# Patient Record
Sex: Male | Born: 1960
Health system: Southern US, Community
[De-identification: ages and names within clinical notes are randomized; demographics above are authoritative.]

## PROBLEM LIST (undated history)

## (undated) DIAGNOSIS — E119 Type 2 diabetes mellitus without complications: Secondary | ICD-10-CM

## (undated) DIAGNOSIS — Z7901 Long term (current) use of anticoagulants: Secondary | ICD-10-CM

## (undated) DIAGNOSIS — A419 Sepsis, unspecified organism: Secondary | ICD-10-CM

## (undated) DIAGNOSIS — I214 Non-ST elevation (NSTEMI) myocardial infarction: Secondary | ICD-10-CM

## (undated) DIAGNOSIS — Z992 Dependence on renal dialysis: Secondary | ICD-10-CM

## (undated) DIAGNOSIS — I428 Other cardiomyopathies: Secondary | ICD-10-CM

## (undated) DIAGNOSIS — N186 End stage renal disease: Secondary | ICD-10-CM

## (undated) DIAGNOSIS — I248 Other forms of acute ischemic heart disease: Secondary | ICD-10-CM

## (undated) DIAGNOSIS — I5020 Unspecified systolic (congestive) heart failure: Secondary | ICD-10-CM

## (undated) DIAGNOSIS — I2510 Atherosclerotic heart disease of native coronary artery without angina pectoris: Secondary | ICD-10-CM

## (undated) DIAGNOSIS — I509 Heart failure, unspecified: Secondary | ICD-10-CM

## (undated) DIAGNOSIS — I5022 Chronic systolic (congestive) heart failure: Secondary | ICD-10-CM

## (undated) DIAGNOSIS — Z794 Long term (current) use of insulin: Secondary | ICD-10-CM

## (undated) DIAGNOSIS — I429 Cardiomyopathy, unspecified: Secondary | ICD-10-CM

## (undated) DIAGNOSIS — I10 Essential (primary) hypertension: Secondary | ICD-10-CM

## (undated) DIAGNOSIS — N184 Chronic kidney disease, stage 4 (severe): Secondary | ICD-10-CM

## (undated) DIAGNOSIS — N170 Acute kidney failure with tubular necrosis: Secondary | ICD-10-CM

## (undated) DIAGNOSIS — D649 Anemia, unspecified: Secondary | ICD-10-CM

## (undated) DIAGNOSIS — I82A12 Acute embolism and thrombosis of left axillary vein: Secondary | ICD-10-CM

## (undated) DIAGNOSIS — N189 Chronic kidney disease, unspecified: Secondary | ICD-10-CM

## (undated) DIAGNOSIS — F32A Anxiety disorder, unspecified: Secondary | ICD-10-CM

## (undated) DIAGNOSIS — F419 Anxiety disorder, unspecified: Secondary | ICD-10-CM

## (undated) DIAGNOSIS — G459 Transient cerebral ischemic attack, unspecified: Secondary | ICD-10-CM

## (undated) DIAGNOSIS — R079 Chest pain, unspecified: Secondary | ICD-10-CM

## (undated) DIAGNOSIS — E109 Type 1 diabetes mellitus without complications: Secondary | ICD-10-CM

## (undated) DIAGNOSIS — R45851 Suicidal ideations: Secondary | ICD-10-CM

## (undated) DIAGNOSIS — E785 Hyperlipidemia, unspecified: Secondary | ICD-10-CM

## (undated) HISTORY — PX: APPENDECTOMY: SHX54

## (undated) HISTORY — PX: CARDIAC CATHETERIZATION: SHX172

## (undated) HISTORY — PX: NASAL SINUS SURGERY: SHX719

## (undated) HISTORY — DX: Atherosclerotic heart disease of native coronary artery without angina pectoris: I25.10

## (undated) HISTORY — DX: Chronic kidney disease, stage 4 (severe): N18.4

## (undated) HISTORY — DX: Heart failure, unspecified: I50.9

## (undated) HISTORY — DX: Chronic kidney disease, unspecified: N18.9

---

## 2007-01-31 ENCOUNTER — Inpatient Hospital Stay: Payer: Self-pay | Admitting: Vascular Surgery

## 2009-07-28 ENCOUNTER — Observation Stay: Payer: Self-pay | Admitting: Internal Medicine

## 2019-02-19 DIAGNOSIS — T304 Corrosion of unspecified body region, unspecified degree: Secondary | ICD-10-CM | POA: Diagnosis not present

## 2019-09-11 ENCOUNTER — Emergency Department: Payer: Self-pay

## 2019-09-11 ENCOUNTER — Inpatient Hospital Stay
Admission: EM | Admit: 2019-09-11 | Discharge: 2019-09-15 | DRG: 193 | Disposition: A | Discharge status: Home / Self Care | Payer: Self-pay | Attending: Internal Medicine | Admitting: Internal Medicine

## 2019-09-11 ENCOUNTER — Inpatient Hospital Stay (HOSPITAL_COMMUNITY)
Admit: 2019-09-11 | Discharge: 2019-09-11 | Disposition: A | Discharge status: Home / Self Care | Payer: Self-pay | Attending: Family Medicine | Admitting: Family Medicine

## 2019-09-11 ENCOUNTER — Other Ambulatory Visit: Payer: Self-pay

## 2019-09-11 DIAGNOSIS — E1169 Type 2 diabetes mellitus with other specified complication: Secondary | ICD-10-CM

## 2019-09-11 DIAGNOSIS — J9601 Acute respiratory failure with hypoxia: Secondary | ICD-10-CM

## 2019-09-11 DIAGNOSIS — J9602 Acute respiratory failure with hypercapnia: Secondary | ICD-10-CM | POA: Diagnosis present

## 2019-09-11 DIAGNOSIS — J9621 Acute and chronic respiratory failure with hypoxia: Secondary | ICD-10-CM | POA: Diagnosis present

## 2019-09-11 DIAGNOSIS — D631 Anemia in chronic kidney disease: Secondary | ICD-10-CM | POA: Diagnosis present

## 2019-09-11 DIAGNOSIS — I161 Hypertensive emergency: Secondary | ICD-10-CM | POA: Diagnosis present

## 2019-09-11 DIAGNOSIS — J9622 Acute and chronic respiratory failure with hypercapnia: Secondary | ICD-10-CM | POA: Diagnosis present

## 2019-09-11 DIAGNOSIS — I130 Hypertensive heart and chronic kidney disease with heart failure and stage 1 through stage 4 chronic kidney disease, or unspecified chronic kidney disease: Secondary | ICD-10-CM | POA: Diagnosis present

## 2019-09-11 DIAGNOSIS — Z803 Family history of malignant neoplasm of breast: Secondary | ICD-10-CM

## 2019-09-11 DIAGNOSIS — E1165 Type 2 diabetes mellitus with hyperglycemia: Secondary | ICD-10-CM

## 2019-09-11 DIAGNOSIS — Z833 Family history of diabetes mellitus: Secondary | ICD-10-CM

## 2019-09-11 DIAGNOSIS — I5042 Chronic combined systolic (congestive) and diastolic (congestive) heart failure: Secondary | ICD-10-CM | POA: Diagnosis present

## 2019-09-11 DIAGNOSIS — E11649 Type 2 diabetes mellitus with hypoglycemia without coma: Secondary | ICD-10-CM | POA: Diagnosis not present

## 2019-09-11 DIAGNOSIS — E785 Hyperlipidemia, unspecified: Secondary | ICD-10-CM | POA: Diagnosis present

## 2019-09-11 DIAGNOSIS — I5020 Unspecified systolic (congestive) heart failure: Secondary | ICD-10-CM

## 2019-09-11 DIAGNOSIS — I248 Other forms of acute ischemic heart disease: Secondary | ICD-10-CM | POA: Diagnosis not present

## 2019-09-11 DIAGNOSIS — N1832 Chronic kidney disease, stage 3b: Secondary | ICD-10-CM | POA: Diagnosis present

## 2019-09-11 DIAGNOSIS — E1122 Type 2 diabetes mellitus with diabetic chronic kidney disease: Secondary | ICD-10-CM | POA: Diagnosis present

## 2019-09-11 DIAGNOSIS — I152 Hypertension secondary to endocrine disorders: Secondary | ICD-10-CM

## 2019-09-11 DIAGNOSIS — Z20822 Contact with and (suspected) exposure to covid-19: Secondary | ICD-10-CM | POA: Diagnosis present

## 2019-09-11 DIAGNOSIS — E1159 Type 2 diabetes mellitus with other circulatory complications: Secondary | ICD-10-CM

## 2019-09-11 DIAGNOSIS — I160 Hypertensive urgency: Secondary | ICD-10-CM

## 2019-09-11 DIAGNOSIS — N179 Acute kidney failure, unspecified: Secondary | ICD-10-CM | POA: Diagnosis present

## 2019-09-11 DIAGNOSIS — Z66 Do not resuscitate: Secondary | ICD-10-CM | POA: Diagnosis not present

## 2019-09-11 DIAGNOSIS — J189 Pneumonia, unspecified organism: Principal | ICD-10-CM | POA: Diagnosis present

## 2019-09-11 DIAGNOSIS — R0602 Shortness of breath: Secondary | ICD-10-CM

## 2019-09-11 DIAGNOSIS — I10 Essential (primary) hypertension: Secondary | ICD-10-CM

## 2019-09-11 DIAGNOSIS — I43 Cardiomyopathy in diseases classified elsewhere: Secondary | ICD-10-CM | POA: Diagnosis present

## 2019-09-11 HISTORY — DX: Type 2 diabetes mellitus without complications: E11.9

## 2019-09-11 HISTORY — DX: Essential (primary) hypertension: I10

## 2019-09-11 HISTORY — DX: Cardiomyopathy, unspecified: I42.9

## 2019-09-11 HISTORY — DX: Chest pain, unspecified: R07.9

## 2019-09-11 HISTORY — DX: Unspecified systolic (congestive) heart failure: I50.20

## 2019-09-11 LAB — COMPREHENSIVE METABOLIC PANEL
ALT: 21 U/L (ref 0–44)
AST: 22 U/L (ref 15–41)
Albumin: 2.8 g/dL — ABNORMAL LOW (ref 3.5–5.0)
Alkaline Phosphatase: 93 U/L (ref 38–126)
Anion gap: 7 (ref 5–15)
BUN: 34 mg/dL — ABNORMAL HIGH (ref 6–20)
CO2: 24 mmol/L (ref 22–32)
Calcium: 8.6 mg/dL — ABNORMAL LOW (ref 8.9–10.3)
Chloride: 109 mmol/L (ref 98–111)
Creatinine, Ser: 1.83 mg/dL — ABNORMAL HIGH (ref 0.61–1.24)
GFR calc Af Amer: 47 mL/min — ABNORMAL LOW (ref >60)
GFR calc non Af Amer: 40 mL/min — ABNORMAL LOW (ref >60)
Glucose, Bld: 313 mg/dL — ABNORMAL HIGH (ref 70–99)
Potassium: 4.3 mmol/L (ref 3.5–5.1)
Sodium: 140 mmol/L (ref 135–145)
Total Bilirubin: 0.4 mg/dL (ref 0.3–1.2)
Total Protein: 6.3 g/dL — ABNORMAL LOW (ref 6.5–8.1)

## 2019-09-11 LAB — CBC WITH DIFFERENTIAL/PLATELET
Abs Immature Granulocytes: 0.02 10*3/uL (ref 0.00–0.07)
Basophils Absolute: 0 10*3/uL (ref 0.0–0.1)
Basophils Relative: 0 %
Eosinophils Absolute: 0.3 10*3/uL (ref 0.0–0.5)
Eosinophils Relative: 5 %
HCT: 33.2 % — ABNORMAL LOW (ref 39.0–52.0)
Hemoglobin: 11.3 g/dL — ABNORMAL LOW (ref 13.0–17.0)
Immature Granulocytes: 0 %
Lymphocytes Relative: 28 %
Lymphs Abs: 1.9 10*3/uL (ref 0.7–4.0)
MCH: 29.4 pg (ref 26.0–34.0)
MCHC: 34 g/dL (ref 30.0–36.0)
MCV: 86.2 fL (ref 80.0–100.0)
Monocytes Absolute: 0.4 10*3/uL (ref 0.1–1.0)
Monocytes Relative: 6 %
Neutro Abs: 4.1 10*3/uL (ref 1.7–7.7)
Neutrophils Relative %: 61 %
Platelets: 269 10*3/uL (ref 150–400)
RBC: 3.85 MIL/uL — ABNORMAL LOW (ref 4.22–5.81)
RDW: 11.8 % (ref 11.5–15.5)
WBC: 6.7 10*3/uL (ref 4.0–10.5)
nRBC: 0 % (ref 0.0–0.2)

## 2019-09-11 LAB — GLUCOSE, CAPILLARY
Glucose-Capillary: 195 mg/dL — ABNORMAL HIGH (ref 70–99)
Glucose-Capillary: 294 mg/dL — ABNORMAL HIGH (ref 70–99)
Glucose-Capillary: 411 mg/dL — ABNORMAL HIGH (ref 70–99)
Glucose-Capillary: 452 mg/dL — ABNORMAL HIGH (ref 70–99)
Glucose-Capillary: 458 mg/dL — ABNORMAL HIGH (ref 70–99)
Glucose-Capillary: 465 mg/dL — ABNORMAL HIGH (ref 70–99)

## 2019-09-11 LAB — BASIC METABOLIC PANEL
Anion gap: 5 (ref 5–15)
BUN: 36 mg/dL — ABNORMAL HIGH (ref 6–20)
CO2: 27 mmol/L (ref 22–32)
Calcium: 8.5 mg/dL — ABNORMAL LOW (ref 8.9–10.3)
Chloride: 109 mmol/L (ref 98–111)
Creatinine, Ser: 1.82 mg/dL — ABNORMAL HIGH (ref 0.61–1.24)
GFR calc Af Amer: 47 mL/min — ABNORMAL LOW (ref >60)
GFR calc non Af Amer: 41 mL/min — ABNORMAL LOW (ref >60)
Glucose, Bld: 260 mg/dL — ABNORMAL HIGH (ref 70–99)
Potassium: 4.6 mmol/L (ref 3.5–5.1)
Sodium: 141 mmol/L (ref 135–145)

## 2019-09-11 LAB — CBC
HCT: 31.8 % — ABNORMAL LOW (ref 39.0–52.0)
Hemoglobin: 10.5 g/dL — ABNORMAL LOW (ref 13.0–17.0)
MCH: 29.2 pg (ref 26.0–34.0)
MCHC: 33 g/dL (ref 30.0–36.0)
MCV: 88.6 fL (ref 80.0–100.0)
Platelets: 274 10*3/uL (ref 150–400)
RBC: 3.59 MIL/uL — ABNORMAL LOW (ref 4.22–5.81)
RDW: 11.8 % (ref 11.5–15.5)
WBC: 7.6 10*3/uL (ref 4.0–10.5)
nRBC: 0 % (ref 0.0–0.2)

## 2019-09-11 LAB — TROPONIN I (HIGH SENSITIVITY)
Troponin I (High Sensitivity): 82 ng/L — ABNORMAL HIGH (ref <18)
Troponin I (High Sensitivity): 163 ng/L (ref <18)
Troponin I (High Sensitivity): 199 ng/L (ref <18)
Troponin I (High Sensitivity): 189 ng/L (ref <18)

## 2019-09-11 LAB — HEMOGLOBIN A1C
Hgb A1c MFr Bld: 9.8 % — ABNORMAL HIGH (ref 4.8–5.6)
Mean Plasma Glucose: 234.56 mg/dL

## 2019-09-11 LAB — RESPIRATORY PANEL BY RT PCR (FLU A&B, COVID)
Influenza A by PCR: NEGATIVE
Influenza B by PCR: NEGATIVE
SARS Coronavirus 2 by RT PCR: NEGATIVE

## 2019-09-11 LAB — ECHOCARDIOGRAM COMPLETE
Height: 66 in
Weight: 2560 oz

## 2019-09-11 LAB — PROCALCITONIN: Procalcitonin: <0.1 ng/mL

## 2019-09-11 LAB — STREP PNEUMONIAE URINARY ANTIGEN: Strep Pneumo Urinary Antigen: NEGATIVE

## 2019-09-11 LAB — BRAIN NATRIURETIC PEPTIDE: B Natriuretic Peptide: 521 pg/mL — ABNORMAL HIGH (ref 0.0–100.0)

## 2019-09-11 LAB — HIV ANTIBODY (ROUTINE TESTING W REFLEX): HIV Screen 4th Generation wRfx: NONREACTIVE

## 2019-09-11 MED ORDER — INSULIN ASPART 100 UNIT/ML ~~LOC~~ SOLN: 0.0000 [IU] | SUBCUTANEOUS | Status: DC

## 2019-09-11 MED ORDER — GUAIFENESIN-DM 100-10 MG/5ML PO SYRP
10.0000 mL | ORAL_SOLUTION | Freq: Four times a day (QID) | ORAL | Status: DC
  Administered 2019-09-11 – 2019-09-15 (×16): 10 mL via ORAL
  Filled 2019-09-11 (×18): qty 10

## 2019-09-11 MED ORDER — ENOXAPARIN SODIUM 40 MG/0.4ML ~~LOC~~ SOLN
40.0000 mg | SUBCUTANEOUS | Status: DC
  Administered 2019-09-11 – 2019-09-15 (×5): 40 mg via SUBCUTANEOUS
  Filled 2019-09-11 (×5): qty 0.4

## 2019-09-11 MED ORDER — METHYLPREDNISOLONE SODIUM SUCC 125 MG IJ SOLR
60.0000 mg | Freq: Two times a day (BID) | INTRAMUSCULAR | Status: DC
  Administered 2019-09-11 – 2019-09-12 (×3): 60 mg via INTRAVENOUS
  Filled 2019-09-11 (×3): qty 2

## 2019-09-11 MED ORDER — INSULIN DETEMIR 100 UNIT/ML ~~LOC~~ SOLN
45.0000 [IU] | Freq: Two times a day (BID) | SUBCUTANEOUS | Status: DC
  Administered 2019-09-11 – 2019-09-12 (×3): 45 [IU] via SUBCUTANEOUS
  Filled 2019-09-11 (×4): qty 0.45

## 2019-09-11 MED ORDER — ONDANSETRON HCL 4 MG PO TABS: 4.0000 mg | ORAL_TABLET | Freq: Four times a day (QID) | ORAL | Status: DC | PRN

## 2019-09-11 MED ORDER — HYDROCOD POLST-CPM POLST ER 10-8 MG/5ML PO SUER: 5.0000 mL | Freq: Two times a day (BID) | ORAL | Status: DC | PRN

## 2019-09-11 MED ORDER — ASPIRIN EC 81 MG PO TBEC
81.0000 mg | DELAYED_RELEASE_TABLET | Freq: Every day | ORAL | Status: DC
  Administered 2019-09-11 – 2019-09-15 (×5): 81 mg via ORAL
  Filled 2019-09-11 (×5): qty 1

## 2019-09-11 MED ORDER — INSULIN DETEMIR 100 UNIT/ML ~~LOC~~ SOLN
10.0000 [IU] | Freq: Every day | SUBCUTANEOUS | Status: DC
  Filled 2019-09-11: qty 0.1

## 2019-09-11 MED ORDER — AMLODIPINE BESYLATE 10 MG PO TABS
10.0000 mg | ORAL_TABLET | Freq: Every day | ORAL | Status: DC
  Administered 2019-09-11 – 2019-09-15 (×5): 10 mg via ORAL
  Filled 2019-09-11 (×4): qty 1
  Filled 2019-09-11: qty 2

## 2019-09-11 MED ORDER — ACETAMINOPHEN 325 MG PO TABS
650.0000 mg | ORAL_TABLET | Freq: Four times a day (QID) | ORAL | Status: DC | PRN
  Administered 2019-09-13 (×2): 650 mg via ORAL
  Filled 2019-09-11 (×2): qty 2

## 2019-09-11 MED ORDER — GUAIFENESIN ER 600 MG PO TB12
600.0000 mg | ORAL_TABLET | Freq: Two times a day (BID) | ORAL | Status: DC
  Administered 2019-09-11: 06:00:00 600 mg via ORAL
  Filled 2019-09-11: qty 1

## 2019-09-11 MED ORDER — LABETALOL HCL 5 MG/ML IV SOLN
20.0000 mg | INTRAVENOUS | Status: DC | PRN
  Administered 2019-09-14: 12:00:00 20 mg via INTRAVENOUS
  Filled 2019-09-11: qty 4

## 2019-09-11 MED ORDER — SODIUM CHLORIDE 0.9 % IV SOLN
2.0000 g | INTRAVENOUS | Status: DC
  Administered 2019-09-11 – 2019-09-14 (×4): 2 g via INTRAVENOUS
  Filled 2019-09-11: qty 2
  Filled 2019-09-11: qty 20
  Filled 2019-09-11: qty 2
  Filled 2019-09-11 (×2): qty 20
  Filled 2019-09-11: qty 2

## 2019-09-11 MED ORDER — ACETAMINOPHEN 650 MG RE SUPP: 650.0000 mg | Freq: Four times a day (QID) | RECTAL | Status: DC | PRN

## 2019-09-11 MED ORDER — INSULIN ASPART 100 UNIT/ML ~~LOC~~ SOLN
22.0000 [IU] | Freq: Once | SUBCUTANEOUS | Status: AC
  Administered 2019-09-11: 23:00:00 22 [IU] via SUBCUTANEOUS

## 2019-09-11 MED ORDER — SODIUM CHLORIDE 0.9 % IV SOLN
500.0000 mg | Freq: Once | INTRAVENOUS | Status: AC
  Administered 2019-09-11: 04:00:00 500 mg via INTRAVENOUS
  Filled 2019-09-11: qty 500

## 2019-09-11 MED ORDER — SODIUM CHLORIDE 0.9 % IV SOLN
500.0000 mg | INTRAVENOUS | Status: DC
  Filled 2019-09-11 (×2): qty 500

## 2019-09-11 MED ORDER — ONDANSETRON HCL 4 MG/2ML IJ SOLN: 4.0000 mg | Freq: Four times a day (QID) | INTRAMUSCULAR | Status: DC | PRN

## 2019-09-11 MED ORDER — TRAZODONE HCL 50 MG PO TABS
25.0000 mg | ORAL_TABLET | Freq: Every evening | ORAL | Status: DC | PRN
  Administered 2019-09-13: 01:00:00 25 mg via ORAL
  Filled 2019-09-11 (×2): qty 1

## 2019-09-11 MED ORDER — MAGNESIUM HYDROXIDE 400 MG/5ML PO SUSP: 30.0000 mL | Freq: Every day | ORAL | Status: DC | PRN

## 2019-09-11 MED ORDER — SODIUM CHLORIDE 0.9 % IV SOLN
1.0000 g | Freq: Once | INTRAVENOUS | Status: AC
  Administered 2019-09-11: 03:00:00 1 g via INTRAVENOUS
  Filled 2019-09-11: qty 10

## 2019-09-11 MED ORDER — HYDROCOD POLST-CPM POLST ER 10-8 MG/5ML PO SUER
5.0000 mL | Freq: Two times a day (BID) | ORAL | Status: DC
  Administered 2019-09-11 – 2019-09-15 (×9): 5 mL via ORAL
  Filled 2019-09-11 (×9): qty 5

## 2019-09-11 MED ORDER — HYDRALAZINE HCL 20 MG/ML IJ SOLN
10.0000 mg | INTRAMUSCULAR | Status: DC | PRN
  Administered 2019-09-13: 21:00:00 10 mg via INTRAVENOUS
  Filled 2019-09-11 (×2): qty 0.5

## 2019-09-11 MED ORDER — INSULIN ASPART 100 UNIT/ML ~~LOC~~ SOLN
0.0000 [IU] | Freq: Three times a day (TID) | SUBCUTANEOUS | Status: DC
  Administered 2019-09-11: 2 [IU] via SUBCUTANEOUS
  Administered 2019-09-11: 14:00:00 5 [IU] via SUBCUTANEOUS
  Filled 2019-09-11 (×2): qty 1

## 2019-09-11 MED ORDER — AMLODIPINE BESYLATE 5 MG PO TABS
5.0000 mg | ORAL_TABLET | Freq: Every day | ORAL | Status: DC
  Administered 2019-09-11: 5 mg via ORAL
  Filled 2019-09-11: qty 1

## 2019-09-11 MED ORDER — IPRATROPIUM-ALBUTEROL 0.5-2.5 (3) MG/3ML IN SOLN
3.0000 mL | Freq: Four times a day (QID) | RESPIRATORY_TRACT | Status: DC
  Administered 2019-09-11 – 2019-09-13 (×8): 3 mL via RESPIRATORY_TRACT
  Filled 2019-09-11 (×8): qty 3

## 2019-09-11 MED ORDER — SODIUM CHLORIDE 0.9 % IV SOLN: INTRAVENOUS | Status: DC

## 2019-09-11 MED ORDER — INSULIN ASPART 100 UNIT/ML ~~LOC~~ SOLN
0.0000 [IU] | SUBCUTANEOUS | Status: DC
  Administered 2019-09-12: 21:00:00 7 [IU] via SUBCUTANEOUS
  Administered 2019-09-12: 09:00:00 3 [IU] via SUBCUTANEOUS
  Administered 2019-09-12: 7 [IU] via SUBCUTANEOUS
  Administered 2019-09-12 (×2): 4 [IU] via SUBCUTANEOUS
  Administered 2019-09-13: 11 [IU] via SUBCUTANEOUS
  Administered 2019-09-13: 01:00:00 3 [IU] via SUBCUTANEOUS
  Administered 2019-09-13: 17:00:00 20 [IU] via SUBCUTANEOUS
  Administered 2019-09-13: 7 [IU] via SUBCUTANEOUS
  Filled 2019-09-11 (×9): qty 1

## 2019-09-11 MED ORDER — ROSUVASTATIN CALCIUM 10 MG PO TABS
10.0000 mg | ORAL_TABLET | Freq: Every day | ORAL | Status: DC
  Administered 2019-09-11 – 2019-09-15 (×5): 10 mg via ORAL
  Filled 2019-09-11 (×5): qty 1

## 2019-09-11 MED ORDER — IOHEXOL 350 MG/ML SOLN
75.0000 mL | Freq: Once | INTRAVENOUS | Status: AC | PRN
  Administered 2019-09-11: 75 mL via INTRAVENOUS

## 2019-09-11 NOTE — ED Provider Notes (Signed)
[facility] Emergency Department Provider Note  ____________________________________________   First MD Initiated Contact with Patient 09/11/19 0124     (approximate)  I have reviewed the triage vital signs and the nursing notes.   HISTORY  Chief Complaint aspiration    HPI Jimmy Olson is a 59 y.o. male with diabetes and hypertension who comes in with concerns for aspiration.  Patient stated he was eating rice and beans when he choked on around 8 PM.  Initially he felt okay but then when he was trying to sleep prior to arrival he started to feel short of breath that was constant, nothing made it better, nothing made it worse.  Patient was satting in the 8s when EMS arrived.  Patient was placed on a nonrebreather and transported to the ER.  Patient's nonrebreather was removed and saturations dropped to 91% and patient was placed on 3 L with recovery.  Patient is having some associated coughing with it.  Denies any chest pain, abdominal pain, new unilateral leg swelling.  Patient initially declined interpreter but I went back in with an interpreter and patient is now reporting that he never choked on the rice and beans.  This was confirmed multiple times with patient.  He denies any chest pain.  He denies being around anybody with coronavirus although he has never had it previously.  He denies any chest pain.  Licensed Spanish interpreter was used.          Past Medical History:  Diagnosis Date  . Diabetes mellitus without complication ([place])   . Hypertension     There are no problems to display for this patient.   History reviewed. No pertinent surgical history.  Prior to Admission medications   Not on File    Allergies Patient has no allergy information on record.  No family history on file.  Social History Social History   Tobacco Use  . Smoking status: Never Smoker  . Smokeless tobacco: Never Used  Substance Use Topics  . Alcohol  use: Not Currently  . Drug use: Never      Review of Systems Constitutional: No fever/chills Eyes: No visual changes. ENT: No sore throat. Cardiovascular: No chest pain Respiratory: Positive for SOB, cough Gastrointestinal: No abdominal pain.  No nausea, no vomiting.  No diarrhea.  No constipation. Genitourinary: Negative for dysuria. Musculoskeletal: Negative for back pain. Skin: Negative for rash. Neurological: Negative for headaches, focal weakness or numbness. All other ROS negative ____________________________________________   PHYSICAL EXAM:  VITAL SIGNS: ED Triage Vitals  Enc Vitals Group     BP 09/11/19 0124 (!) 222/123     Pulse Rate 09/11/19 0124 (!) 103     Resp 09/11/19 0124 (!) 24     Temp 09/11/19 0124 98.4 F (36.9 C)     Temp Source 09/11/19 0124 Oral     SpO2 09/11/19 0124 91 %     Weight 09/11/19 0127 160 lb (72.6 kg)     Height 09/11/19 0127 5\' 6"  (1.676 m)     Head Circumference --      Peak Flow --      Pain Score 09/11/19 0126 0     Pain Loc --      Pain Edu? --      Excl. in [place]? --     Constitutional: Alert and oriented. Well appearing and in no acute distress. Eyes: Conjunctivae are normal. EOMI. Head: Atraumatic. Nose: No congestion/rhinnorhea. Mouth/Throat: Mucous membranes are moist.  Neck: No stridor. Trachea Midline. FROM Cardiovascular: Tachycardic, regular rhythm. Grossly normal heart sounds.  Good peripheral circulation. Respiratory: Coarse breath sounds bilaterally, on 3 L of oxygen, frequently coughing Gastrointestinal: Soft and nontender. No distention. No abdominal bruits.  Musculoskeletal: No lower extremity tenderness nor edema.  No joint effusions. Neurologic:  Normal speech and language. No gross focal neurologic deficits are appreciated.  Skin:  Skin is warm, dry and intact. No rash noted. Psychiatric: Mood and affect are normal. Speech and behavior are normal. GU: Deferred    ____________________________________________   LABS (all labs ordered are listed, but only abnormal results are displayed)  Labs Reviewed  CBC WITH DIFFERENTIAL/PLATELET - Abnormal; Notable for the following components:      Result Value   RBC 3.85 (*)    Hemoglobin 11.3 (*)    HCT 33.2 (*)    All other components within normal limits  COMPREHENSIVE METABOLIC PANEL - Abnormal; Notable for the following components:   Glucose, Bld 313 (*)    BUN 34 (*)    Creatinine, Ser 1.83 (*)    Calcium 8.6 (*)    Total Protein 6.3 (*)    Albumin 2.8 (*)    GFR calc non Af Amer 40 (*)    GFR calc Af Amer 47 (*)    All other components within normal limits  BRAIN NATRIURETIC PEPTIDE - Abnormal; Notable for the following components:   B Natriuretic Peptide 521.0 (*)    All other components within normal limits  TROPONIN I (HIGH SENSITIVITY) - Abnormal; Notable for the following components:   Troponin I (High Sensitivity) 82 (*)    All other components within normal limits  RESPIRATORY PANEL BY RT PCR (FLU A&B, COVID)  PROCALCITONIN   ____________________________________________   ED ECG REPORT I, Vanessa [place], the attending physician, personally viewed and interpreted this ECG.  EKG is sinus tachycardia rate of 104, no ST elevation, no T wave inversions, normal intervals ____________________________________________  RADIOLOGY Robert Bellow, personally viewed and evaluated these images (plain radiographs) as part of my medical decision making, as well as reviewing the written report by the radiologist.  ED MD interpretation: Bilateral opacities concerning for pneumonia  Official radiology report(s): DG Chest 2 View  Result Date: 09/11/2019 CLINICAL DATA:  Aspiration evaluation EXAM: CHEST - 2 VIEW COMPARISON:  07/28/2009 FINDINGS: Patchy bilateral airspace disease. Heart is normal size. No effusions or pneumothorax. No acute bony abnormality. IMPRESSION: Patchy bilateral airspace  disease concerning for pneumonia. Electronically Signed   By: Rolm Baptise M.D.   On: 09/11/2019 01:44    ____________________________________________   PROCEDURES  Procedure(s) performed (including Critical Care):  .Critical Care Performed by: Vanessa [place], MD Authorized by: Vanessa [place], MD   Critical care provider statement:    Critical care time (minutes):  31   Critical care was necessary to treat or prevent imminent or life-threatening deterioration of the following conditions:  Respiratory failure   Critical care was time spent personally by me on the following activities:  Discussions with consultants, evaluation of patient's response to treatment, examination of patient, ordering and performing treatments and interventions, ordering and review of laboratory studies, ordering and review of radiographic studies, pulse oximetry, re-evaluation of patient's condition, obtaining history from patient or surrogate and review of old charts     ____________________________________________   INITIAL IMPRESSION / ASSESSMENT AND PLAN / ED COURSE   WAINO MOUNSEY was evaluated in Emergency Department on 09/11/2019 for the symptoms described  in the history of present illness. He was evaluated in the context of the global COVID-19 pandemic, which necessitated consideration that the patient might be at risk for infection with the SARS-CoV-2 virus that causes COVID-19. Institutional protocols and algorithms that pertain to the evaluation of patients at risk for COVID-19 are in a state of rapid change based on information released by regulatory bodies including the CDC and federal and state organizations. These policies and algorithms were followed during the patient's care in the ED.     Pt presents with SOB.  Most likely secondary to aspiration vs pna vs covid PNA-will get xray to evaluation Anemia-CBC to evaluate ACS- will get trops Arrhythmia-Will get EKG and keep on monitor.  PE-lower  suspicion given no risk factors and other cause more likely  BNP is slightly elevated but no significant edema in his legs. Troponin was elevated at 82.  Patient had no chest pain.  No STEMI on EKG.  We will continue to trend. Kidney function is 1.8.  Unclear baseline. Glucose elevated in the 300s but no evidence of DKA Hemoglobin slightly low at 11.3  Patient was covered with antibiotics prior to procalcitonin resulting to cover for possible bacterial pneumonia.  Patient's Covid came back negative and procalcitonin was negative. Will get CT PE just to make sure that were not missing anything else such as pulmonary embolism.   Discussed with the hospital team for admission due to new hypoxia  ____________________________________________   FINAL CLINICAL IMPRESSION(S) / ED DIAGNOSES   Final diagnoses:  Acute respiratory failure with hypoxia ([place])  Community acquired pneumonia, unspecified laterality     MEDICATIONS GIVEN DURING THIS VISIT:  Medications  cefTRIAXone (ROCEPHIN) 1 g in sodium chloride 0.9 % 100 mL IVPB (1 g Intravenous New Bag/Given 09/11/19 0311)  azithromycin (ZITHROMAX) 500 mg in sodium chloride 0.9 % 250 mL IVPB (has no administration in time range)     ED Discharge Orders    None       Note:  This document was prepared using Dragon voice recognition software and may include unintentional dictation errors.   Vanessa [place], MD 09/11/19 [phone]

## 2019-09-11 NOTE — ED Triage Notes (Signed)
Pt arrives [facility] from home w cc of aspiration of rice and beans at 8pm earlier tonight. Pt reports the cough woke him up from sleep tonight and prompted him to call 911.   EMS reports rhonchi R upper lobe and 82% room air. Pt was placed on non-rebreather and went to 100%  Pt has coughing spells that sometimes calm down. Able to speak in short sentences.   Pt has hx diabetes. Didn't take sugar medicine or blood pressure medicines yesterday. BS 337

## 2019-09-11 NOTE — ED Notes (Signed)
Pt up to restroom with NT. Pt given tv remote. Bed locked low. Rail up. Call bell within reach.

## 2019-09-11 NOTE — ED Notes (Signed)
Interpretor at bedside. Pt has no needs. Updated on delays and plan of care. Received name of pt pharmacy so pharm tech and get accurate med list.

## 2019-09-11 NOTE — ED Notes (Signed)
Will give insulin once lunch tray at bedside.

## 2019-09-11 NOTE — H&P (Signed)
[place] [facility] [place] NAME: Jimmy Olson    MR#:  [id number]  DATE OF BIRTH:  July 01, 1960  DATE OF ADMISSION:  09/11/2019  PRIMARY CARE PHYSICIAN: No primary care provider on file.   REQUESTING/REFERRING PHYSICIAN: Marjean Donna, MD  CHIEF COMPLAINT:   Chief Complaint  Patient presents with  . Cough  . Shortness of Breath    HISTORY OF PRESENT ILLNESS:  Jimmy Olson  is a 59 y.o. male with a known history of type II diabetes mellitus and hypertension, presented to the emergency room with acute onset of dyspnea with a feeling of chest closing up as well as wheezing that woke him up today.  He has been having cough over the last few days and was noted to have hoarseness of voice.  He denied any fever or chills.  No rhinorrhea or nasal congestion or sore throat or earache.  No nausea or vomiting or abdominal pain.  He denied choking on food.  His daughter who was sick visited him a few days ago.  No COVID-19 exposure though.  No dysuria, oliguria or hematuria or flank pain.  When he came to the ER blood pressure was 222/123 with a pulse of 103 and respiratory 24 pulse currently 91% on room air and then 95% on 3 L O2 by nasal cannula.  Labs revealed blood glucose of 313, BUN of 34 and creatinine 1.84 with no previous levels for comparison.  BNP was 521 and high-sensitivity troponin I was 82.  Procalcitonin was less than 0.1 and CBC showed mild anemia with hemoglobin 11.3 and hematocrit 33.2.  COVID-19 PCR came back negative and influenza antigens were negative.  Two-view chest x-ray showed patchy bibasal airspace disease concerning for pneumonia. EKG showed sinus tachycardia with a rate of 104 with probable left atrial enlargement.  The patient was given IV Rocephin and Zithromax.  He will be admitted to the medical monitored bed for further evaluation and management.  PAST MEDICAL HISTORY:   Past Medical History:  Diagnosis Date  . Diabetes mellitus without  complication ([place])   . Hypertension     PAST SURGICAL HISTORY:  History reviewed. No pertinent surgical history.  SOCIAL HISTORY:   Social History   Tobacco Use  . Smoking status: Never Smoker  . Smokeless tobacco: Never Used  Substance Use Topics  . Alcohol use: Not Currently    FAMILY HISTORY:  No family history on file.  Positive for diabetes mellitus in one of his sisters and breast cancer and another.  DRUG ALLERGIES:  Not on File  REVIEW OF SYSTEMS:   ROS As per history of present illness. All pertinent systems were reviewed above. Constitutional,  HEENT, cardiovascular, respiratory, GI, GU, musculoskeletal, neuro, psychiatric, endocrine,  integumentary and hematologic systems were reviewed and are otherwise  negative/unremarkable except for positive findings mentioned above in the HPI.   MEDICATIONS AT HOME:   Prior to Admission medications   Not on File      VITAL SIGNS:  Blood pressure (!) 189/90, pulse 86, temperature 98.4 F (36.9 C), temperature source Oral, resp. rate 19, height 5\' 6"  (1.676 m), weight 72.6 kg, SpO2 96 %.  PHYSICAL EXAMINATION:  Physical Exam  GENERAL:  59 y.o.-year-old Hispanic male patient lying in the bed with mild respiratory distress with conversational dyspnea EYES: Pupils equal, round, reactive to light and accommodation. No scleral icterus. Extraocular muscles intact.  HEENT: Head atraumatic, normocephalic. Oropharynx and nasopharynx clear.  NECK:  Supple, no  jugular venous distention. No thyroid enlargement, no tenderness.  LUNGS: Bibasilar and midlung zone crackles.  He had diminished bibasal breath sounds.  CARDIOVASCULAR: Regular rate and rhythm, S1, S2 normal. No murmurs, rubs, or gallops.  ABDOMEN: Soft, nondistended, nontender. Bowel sounds present. No organomegaly or mass.  EXTREMITIES: No pedal edema, cyanosis, or clubbing.  NEUROLOGIC: Cranial nerves II through XII are intact. Muscle strength 5/5 in all extremities.  Sensation intact. Gait not checked.  PSYCHIATRIC: The patient is alert and oriented x 3.  Normal affect and good eye contact. SKIN: No obvious rash, lesion, or ulcer.   LABORATORY PANEL:   CBC Recent Labs  Lab 09/11/19 0120  WBC 6.7  HGB 11.3*  HCT 33.2*  PLT 269   ------------------------------------------------------------------------------------------------------------------  Chemistries  Recent Labs  Lab 09/11/19 0120  NA 140  K 4.3  CL 109  CO2 24  GLUCOSE 313*  BUN 34*  CREATININE 1.83*  CALCIUM 8.6*  AST 22  ALT 21  ALKPHOS 93  BILITOT 0.4   ------------------------------------------------------------------------------------------------------------------  Cardiac Enzymes No results for input(s): TROPONINI in the last 168 hours. ------------------------------------------------------------------------------------------------------------------  RADIOLOGY:  DG Chest 2 View  Result Date: 09/11/2019 CLINICAL DATA:  Aspiration evaluation EXAM: CHEST - 2 VIEW COMPARISON:  07/28/2009 FINDINGS: Patchy bilateral airspace disease. Heart is normal size. No effusions or pneumothorax. No acute bony abnormality. IMPRESSION: Patchy bilateral airspace disease concerning for pneumonia. Electronically Signed   By: Rolm Baptise M.D.   On: 09/11/2019 01:44      IMPRESSION AND PLAN:   1.  Multifocal community-acquired pneumonia. -The patient will be admitted to medical monitored bed. -We will continue antibiotic therapy with IV Rocephin and Zithromax. -We will obtain sputum Gram stain culture and sensitivity as well as follow blood cultures. -Mucolytics will be provided and DuoNebs on a scheduled and as needed basis.   2.  Acute hypoxic respiratory failure secondary to #1. -Chest CTA was obtained and is currently pending. -This should rule out PE and assess for pneumonia and cor pulmonale given elevated BNP. -We will also obtain a 2D echo given elevated BNP and will be  cautious with hydration.  3.  Hypertensive urgency. -The patient will be placed on as needed IV labetalol. -We will continue his antihypertensives when available.  4.  Uncontrolled type 2 diabetes mellitus with hyperglycemia. -The patient will be placed on supplemental coverage with NovoLog. -We will hold off his Metformin.  5.  DVT prophylaxis. -Subcutaneous Lovenox   All the records are reviewed and case discussed with ED provider. The plan of care was discussed in details with the patient (and family). I answered all questions. The patient agreed to proceed with the above mentioned plan. Further management will depend upon hospital course.   CODE STATUS: Full code.    The patient is admitted to inpatient status due to the intensity of medical problems, potential risks involved and management requirement that will necessitate more than 2 midnights.   TOTAL TIME TAKING CARE OF THIS PATIENT: 55 minutes.    Christel Mormon M.D on 09/11/2019 at 3:35 AM  [facility]   From 7 PM-7 AM, contact night-coverage [email or web address]  CC: Primary care physician; No primary care provider on file.   Note: This dictation was prepared with Dragon dictation along with smaller phrase technology. Any transcriptional errors that result from this process are unintentional.

## 2019-09-11 NOTE — Progress Notes (Signed)
*  PRELIMINARY RESULTS* Echocardiogram 2D Echocardiogram has been performed.  Jimmy Olson 09/11/2019, 11:14 AM

## 2019-09-11 NOTE — ED Notes (Signed)
Interpretor stick at bedside w MD Jari Pigg. Per pt, he did not choke on rice and beans tonight and denies the event. Pt reports waking up with shortness of breath and coughing fit that prompted him to call 911.

## 2019-09-11 NOTE — ED Notes (Signed)
Pt transported to Xray. 

## 2019-09-11 NOTE — Progress Notes (Signed)
PROGRESS NOTE    Patient: Jimmy Olson                            PCP: System, Pcp Not In                    DOB: 1960-10-21            DOA: 09/11/2019 KCL:[id number]             DOS: 09/11/2019, 9:37 AM   LOS: 0 days   Date of Service: The patient was seen and examined on 09/11/2019  Subjective:   The patient was seen and examined this morning, still complaining of cough, mild shortness of breath, requiring 3 L of oxygen via nasal cannula to maintain O2 sat of 99%.   Brief Narrative:   a 59 y.o. Hispanic male with a known history of type II diabetes mellitus and hypertension, presented to the emergency room with acute onset of dyspnea with a feeling of chest closing up as well as wheezing.  Upon presentation patient was hypertensive with 222/123, hypoxic satting 91% on room air, SARS-CoV-2 negative, influenza A/B negative, chest x-ray and CTA of the chest consistent with multifocal pneumonia, pulmonary edema. Started on supplement oxygen, IV antibiotics, cultures were obtained.   Assessment & Plan:   Active Problems:   CAP (community acquired pneumonia)   Acute respiratory distress due to multifocal pneumonia  -We will continue supplemental oxygen, currently on 3 L of oxygen, satting 99% The goal be to taper him down back to his baseline of oxygen -Continue DuoNeb bronchodilator treatments -IV steroids Treating underlying cause of pneumonia with IV antibiotics   multifocal community-acquired pneumonia. -We will follow-up with cultures accordingly -Chest x-ray CTA was reviewed negative for pulmonary embolism, possible pulmonary edema, multifocal pneumonia -We will continue antibiotic therapy with IV Rocephin and Zithromax. -We will obtain sputum Gram stain culture and sensitivity as well as follow blood cultures. -Mucolytics will be provided and DuoNebs on a scheduled and as needed basis. --Due to respiratory distress brief course of steroids will be added   Hypertensive  urgency. -Monitor closely, blood pressure has improved from admission -We will continue his antihypertensives when available. -Home medication of Norvasc 10 mg p.o. will be added, with holding lisinopril due to mild AKI -As needed IV hydralazine will be added  Uncontrolled type 2 diabetes mellitus with hyperglycemia. -Anticipating further hypoglycemia due to addition of the steroids, -We will check CBG QA CHS -We will hold off his Metformin. -Patient home medication of Levemir will be added along with SSI  Hyperlipidemia -Continue statins   Nutritional status:         Cultures; 09/10/2019 blood/sputum culture >>  Antimicrobials: 09/10/2019 IV antibiotics azithromycin/Rocephin >>  ==================================================================  DVT prophylaxis: SCD/Compression stockings and Lovenox SQ Code Status:   Code Status: Full Code Family Communication: No family member present at bedside- attempt will be made to update daily The above findings and plan of care has been discussed with patient and family in detail,  they expressed understanding and agreement of above. Disposition Plan:   Anticipated 1-2 days Admission status:  NPATIEN -due to acute respiratory distress, multifocal pneumonia,  Consultants: None    Imaging:  Chest x-ray: Reviewed patchy bilateral airspace disease finding consistent with pneumonia  CT angiogram -Negative pulmonary embolism Scattered opacity consistent with multifocal pneumonia, pulmonary edema   Procedures:   No admission procedures for hospital encounter.  Antimicrobials:  Anti-infectives (From admission, onward)   Start     Dose/Rate Route Frequency Ordered Stop   09/12/19 0800  azithromycin (ZITHROMAX) 500 mg in sodium chloride 0.9 % 250 mL IVPB     500 mg 250 mL/hr over 60 Minutes Intravenous Every 24 hours 09/11/19 0335 09/17/19 0759   09/11/19 1500  cefTRIAXone (ROCEPHIN) 2 g in sodium chloride 0.9 % 100 mL  IVPB     2 g 200 mL/hr over 30 Minutes Intravenous Every 24 hours 09/11/19 0335 09/16/19 1459   09/11/19 0300  cefTRIAXone (ROCEPHIN) 1 g in sodium chloride 0.9 % 100 mL IVPB     1 g 200 mL/hr over 30 Minutes Intravenous  Once 09/11/19 0248 09/11/19 0416   09/11/19 0300  azithromycin (ZITHROMAX) 500 mg in sodium chloride 0.9 % 250 mL IVPB     500 mg 250 mL/hr over 60 Minutes Intravenous  Once 09/11/19 0248 09/11/19 0734       Medication:  . amLODipine  10 mg Oral Daily  . aspirin EC  81 mg Oral Daily  . chlorpheniramine-HYDROcodone  5 mL Oral Q12H  . enoxaparin (LOVENOX) injection  40 mg Subcutaneous Q24H  . guaiFENesin-dextromethorphan  10 mL Oral Q6H  . insulin aspart  0-9 Units Subcutaneous TID PC & HS  . insulin detemir  45 Units Subcutaneous BID  . ipratropium-albuterol  3 mL Nebulization QID  . methylPREDNISolone (SOLU-MEDROL) injection  60 mg Intravenous Q12H  . rosuvastatin  10 mg Oral Daily    acetaminophen **OR** acetaminophen, hydrALAZINE, labetalol, magnesium hydroxide, ondansetron **OR** ondansetron (ZOFRAN) IV, traZODone   Objective:   Vitals:   09/11/19 0230 09/11/19 0245 09/11/19 0700 09/11/19 0730  BP: (!) 189/90  (!) 170/103 (!) 178/112  Pulse: 91 86 98 (!) 109  Resp: 19 19 (!) 22 (!) 27  Temp:      TempSrc:      SpO2: 95% 96% 92% 99%  Weight:      Height:        Intake/Output Summary (Last 24 hours) at 09/11/2019 3614 Last data filed at 09/11/2019 0416 Gross per 24 hour  Intake 100 ml  Output --  Net 100 ml   Filed Weights   09/11/19 0127  Weight: 72.6 kg     Examination:   Physical Exam  Constitution:  Alert, cooperative, no distress,  Appears calm and comfortable  Psychiatric: Normal and stable mood and affect, cognition intact,   HEENT: Normocephalic, PERRL, otherwise with in Normal limits  Chest:Chest symmetric Cardio vascular:  S1/S2, RRR, No murmure, No Rubs or Gallops  pulmonary: Scattered mild wheezing, rhonchi, positive breath  sounds throughout the lung fields Abdomen: Soft, non-tender, non-distended, bowel sounds,no masses, no organomegaly Muscular skeletal: Limited exam - in bed, able to move all 4 extremities, Normal strength,  Neuro: CNII-XII intact. , normal motor and sensation, reflexes intact  Extremities: No pitting edema lower extremities, +2 pulses  Skin: Dry, warm to touch, negative for any Rashes, No open wounds Wounds: per nursing documentation          LABs:  CBC Latest Ref Rng & Units 09/11/2019 09/11/2019  WBC 4.0 - 10.5 K/uL 7.6 6.7  Hemoglobin 13.0 - 17.0 g/dL 10.5(L) 11.3(L)  Hematocrit 39.0 - 52.0 % 31.8(L) 33.2(L)  Platelets 150 - 400 K/uL 274 269   CMP Latest Ref Rng & Units 09/11/2019 09/11/2019  Glucose 70 - 99 mg/dL 260(H) 313(H)  BUN 6 - 20 mg/dL 36(H) 34(H)  Creatinine 0.61 - 1.24  mg/dL 1.82(H) 1.83(H)  Sodium 135 - 145 mmol/L 141 140  Potassium 3.5 - 5.1 mmol/L 4.6 4.3  Chloride 98 - 111 mmol/L 109 109  CO2 22 - 32 mmol/L 27 24  Calcium 8.9 - 10.3 mg/dL 8.5(L) 8.6(L)  Total Protein 6.5 - 8.1 g/dL - 6.3(L)  Total Bilirubin 0.3 - 1.2 mg/dL - 0.4  Alkaline Phos 38 - 126 U/L - 93  AST 15 - 41 U/L - 22  ALT 0 - 44 U/L - 21        SIGNED: Deatra James, MD, FACP, FHM. [facility],  Pager [phone]  If 7PM-7AM, please contact night-coverage [email or web address] 09/11/2019, 9:37 AM

## 2019-09-11 NOTE — ED Notes (Signed)
Pt transported to CT ?

## 2019-09-12 ENCOUNTER — Encounter: Payer: Self-pay | Admitting: Family Medicine

## 2019-09-12 DIAGNOSIS — J189 Pneumonia, unspecified organism: Principal | ICD-10-CM

## 2019-09-12 DIAGNOSIS — J9601 Acute respiratory failure with hypoxia: Secondary | ICD-10-CM

## 2019-09-12 DIAGNOSIS — J9602 Acute respiratory failure with hypercapnia: Secondary | ICD-10-CM

## 2019-09-12 DIAGNOSIS — E1169 Type 2 diabetes mellitus with other specified complication: Secondary | ICD-10-CM

## 2019-09-12 DIAGNOSIS — I10 Essential (primary) hypertension: Secondary | ICD-10-CM

## 2019-09-12 DIAGNOSIS — I420 Dilated cardiomyopathy: Secondary | ICD-10-CM

## 2019-09-12 DIAGNOSIS — E785 Hyperlipidemia, unspecified: Secondary | ICD-10-CM

## 2019-09-12 DIAGNOSIS — I248 Other forms of acute ischemic heart disease: Secondary | ICD-10-CM

## 2019-09-12 LAB — PROCALCITONIN: Procalcitonin: 0.11 ng/mL

## 2019-09-12 LAB — BASIC METABOLIC PANEL
Anion gap: 7 (ref 5–15)
BUN: 45 mg/dL — ABNORMAL HIGH (ref 6–20)
CO2: 23 mmol/L (ref 22–32)
Calcium: 8.5 mg/dL — ABNORMAL LOW (ref 8.9–10.3)
Chloride: 111 mmol/L (ref 98–111)
Creatinine, Ser: 2.02 mg/dL — ABNORMAL HIGH (ref 0.61–1.24)
GFR calc Af Amer: 41 mL/min — ABNORMAL LOW (ref >60)
GFR calc non Af Amer: 36 mL/min — ABNORMAL LOW (ref >60)
Glucose, Bld: 229 mg/dL — ABNORMAL HIGH (ref 70–99)
Potassium: 4.2 mmol/L (ref 3.5–5.1)
Sodium: 141 mmol/L (ref 135–145)

## 2019-09-12 LAB — GLUCOSE, CAPILLARY
Glucose-Capillary: 241 mg/dL — ABNORMAL HIGH (ref 70–99)
Glucose-Capillary: 127 mg/dL — ABNORMAL HIGH (ref 70–99)
Glucose-Capillary: 176 mg/dL — ABNORMAL HIGH (ref 70–99)
Glucose-Capillary: 158 mg/dL — ABNORMAL HIGH (ref 70–99)
Glucose-Capillary: 248 mg/dL — ABNORMAL HIGH (ref 70–99)

## 2019-09-12 LAB — CBC
HCT: 32 % — ABNORMAL LOW (ref 39.0–52.0)
Hemoglobin: 10.6 g/dL — ABNORMAL LOW (ref 13.0–17.0)
MCH: 29 pg (ref 26.0–34.0)
MCHC: 33.1 g/dL (ref 30.0–36.0)
MCV: 87.7 fL (ref 80.0–100.0)
Platelets: 292 10*3/uL (ref 150–400)
RBC: 3.65 MIL/uL — ABNORMAL LOW (ref 4.22–5.81)
RDW: 11.9 % (ref 11.5–15.5)
WBC: 11.3 10*3/uL — ABNORMAL HIGH (ref 4.0–10.5)
nRBC: 0 % (ref 0.0–0.2)

## 2019-09-12 LAB — BRAIN NATRIURETIC PEPTIDE: B Natriuretic Peptide: 744 pg/mL — ABNORMAL HIGH (ref 0.0–100.0)

## 2019-09-12 MED ORDER — METHYLPREDNISOLONE SODIUM SUCC 125 MG IJ SOLR: 60.0000 mg | Freq: Four times a day (QID) | INTRAMUSCULAR | Status: DC

## 2019-09-12 MED ORDER — ISOSORBIDE MONONITRATE ER 30 MG PO TB24
30.0000 mg | ORAL_TABLET | Freq: Every day | ORAL | Status: DC
  Administered 2019-09-12 – 2019-09-15 (×4): 30 mg via ORAL
  Filled 2019-09-12 (×4): qty 1

## 2019-09-12 MED ORDER — INSULIN DETEMIR 100 UNIT/ML ~~LOC~~ SOLN
50.0000 [IU] | Freq: Two times a day (BID) | SUBCUTANEOUS | Status: DC
  Administered 2019-09-13 (×2): 50 [IU] via SUBCUTANEOUS
  Filled 2019-09-12 (×5): qty 0.5

## 2019-09-12 MED ORDER — BISOPROLOL FUMARATE 5 MG PO TABS
5.0000 mg | ORAL_TABLET | Freq: Every day | ORAL | Status: DC
  Administered 2019-09-12 – 2019-09-14 (×3): 5 mg via ORAL
  Filled 2019-09-12 (×3): qty 1

## 2019-09-12 MED ORDER — AZITHROMYCIN 500 MG PO TABS
500.0000 mg | ORAL_TABLET | Freq: Every day | ORAL | Status: AC
  Administered 2019-09-12 – 2019-09-15 (×4): 500 mg via ORAL
  Filled 2019-09-12 (×4): qty 1

## 2019-09-12 MED ORDER — METHYLPREDNISOLONE SODIUM SUCC 125 MG IJ SOLR
60.0000 mg | Freq: Three times a day (TID) | INTRAMUSCULAR | Status: DC
  Administered 2019-09-12 – 2019-09-13 (×3): 60 mg via INTRAVENOUS
  Filled 2019-09-12 (×4): qty 2

## 2019-09-12 MED ORDER — NITROGLYCERIN 0.4 MG SL SUBL
0.4000 mg | SUBLINGUAL_TABLET | SUBLINGUAL | Status: DC | PRN
  Administered 2019-09-14: 11:00:00 0.4 mg via SUBLINGUAL
  Filled 2019-09-12: qty 1

## 2019-09-12 NOTE — TOC Initial Note (Addendum)
Transition of Care [facility]) - Initial/Assessment Note    Patient Details  Name: Jimmy Olson MRN: [id number] Date of Birth: January 02, 1961  Transition of Care [facility]) CM/SW Contact:    Elease Hashimoto, LCSW Phone Number: 09/12/2019, 1:57 PM  Clinical Narrative:   Met with pt and spoke with wife via telephone to discuss PCP and medication assistance. Have given pt the paperwork for [facility] in [place] he and wife will follow up with. He lives with his wife and has not been able to work due to being sick. Wife is in good health and can assist she does work. Pt may need O2 at discharge he did not have this prior to admission. Will await pt clinical improvement and work with on discharge needs. Pt was independent prior to admission and was driving prior to admission. Will continue to follow and work on needs.               2:05 PM Have found pt does go to [facility] for medical follow up  Expected Discharge Plan: Home/Self Care Barriers to Discharge: Inadequate or no insurance, Continued Medical Work up   Patient Goals and CMS Choice Patient states their goals for this hospitalization and ongoing recovery are:: I hope to feel better      Expected Discharge Plan and Services Expected Discharge Plan: Home/Self Care In-house Referral: Clinical Social Work Discharge Planning Services: Medication Assistance, [facility]   Living arrangements for the past 2 months: [place]                                      Prior Living Arrangements/Services Living arrangements for the past 2 months: Single Family Home Lives with:: Spouse Patient language and need for interpreter reviewed:: Yes        Need for Family Participation in Patient Care: Yes (Comment) Care giver support system in place?: Yes (comment)   Criminal Activity/Legal Involvement Pertinent to Current Situation/Hospitalization: No - Comment as needed  Activities of Daily Living Home  Assistive Devices/Equipment: None ADL Screening (condition at time of admission) Patient's cognitive ability adequate to safely complete daily activities?: Yes Is the patient deaf or have difficulty hearing?: No Does the patient have difficulty seeing, even when wearing glasses/contacts?: No Does the patient have difficulty concentrating, remembering, or making decisions?: No Patient able to express need for assistance with ADLs?: Yes Does the patient have difficulty dressing or bathing?: No Independently performs ADLs?: Yes (appropriate for developmental age) Does the patient have difficulty walking or climbing stairs?: No Weakness of Legs: None Weakness of Arms/Hands: None  Permission Sought/Granted Permission sought to share information with : Facility Art therapist granted to share information with : Yes, Verbal Permission Granted  Share Information with NAME: Anderson Malta  Permission granted to share info w AGENCY: [facility]        Emotional Assessment Appearance:: Appears stated age Attitude/Demeanor/Rapport: Gracious Affect (typically observed): Accepting Orientation: : Oriented to Self, Oriented to Place, Oriented to  Time, Oriented to Situation Alcohol / Substance Use: Tobacco Use Psych Involvement: No (comment)  Admission diagnosis:  CAP (community acquired pneumonia) [J18.9] Acute respiratory failure with hypoxia ([place]) [J96.01] Community acquired pneumonia, unspecified laterality [J18.9] Patient Active Problem List   Diagnosis Date Noted  . CAP (community acquired pneumonia) 09/11/2019  . Acute respiratory failure with hypercapnia ([place]) 09/11/2019  . Accelerated hypertension 09/11/2019  .  Type 2 diabetes mellitus with hyperlipidemia ([place]) 09/11/2019  . Type 2 diabetes mellitus with other specified complication ([place]) 43/70/0525  . AKI (acute kidney injury) ([place]) 09/11/2019   PCP:  System, Pcp Not In Pharmacy:   [place], [address] [place] [address] Phone: [phone] Fax: [phone]     Social Determinants of Health (SDOH) Interventions    Readmission Risk Interventions No flowsheet data found.

## 2019-09-12 NOTE — Consult Note (Signed)
Cardiology Consult    Patient ID: Jimmy Olson MRN: [id number], DOB/AGE: 1960-08-16   Admit date: 09/11/2019 Date of Consult: 09/12/2019  Primary Physician: System, Pcp Not In Primary Cardiologist: Ida Rogue, MD Requesting Provider: Cherlyn Labella, MD  Patient Profile    Jimmy Olson is a 59 y.o. male with a history of DMII, HTN, and prior normal stress test, who is being seen today for the evaluation of dyspnea, PNA, hypertensive urgency, LV dysfunction, and HsTroponin elevation at the request of Dr. Roger Shelter.  Past Medical History   Past Medical History:  Diagnosis Date  . Cardiomyopathy ([place])    a. 07/2009 MV: EF 48%; b. 08/2019 Echo: EF 45-50%. Global HK. Mod LVH. Gr1 DD. Nl RV fxn. Nl PASP.  Marland Kitchen Chest pain    a. 07/2009 MV: EF 48%. No ischemia.  . Diabetes mellitus without complication ([place])   . HFmrEF (heart failure with mildy reduced ejection fraction) ([place])   . Hypertension     History reviewed. No pertinent surgical history.   Allergies  Not on File  History of Present Illness    59 y/o ? w/ a h/o chest pain and neg MV in 2011, HTN, and DMII.  He lives locally with his wife and son.  He is currently unemployed but is fairly active.  Over the past 3 months or so, he has noted frequent left sided chest pain w/o assoc Ss, occurring most days of the week, lasting 20-40 mins, and resolving spontaneously.  He has noted c/p @ rest, w/ heavy exertion, and also w/ emotional stress.  On the evening of 3/24, he aspirated some rice and beans @ 8pm.  He coughed and initially felt better but then awoke later w/ coughing and severe difficulty breathing, chest tightness, and wheezing.  He called EMS and was found to be hypoxic w/ O2 sat of 82% on room air.  He was placed on 100% NRB and was taken to the ED where he was markedly hypertensive @ 222/123.  ECG sinus tach w/o obvious acute ST/T changes.  Labs notable for BUN/Creat of 34/1.84.  BNP 521. HsTrop 82.  Procalcitonin nl.  COVID/Influenza neg. CXR w/ patchy bibasal ASD concerning for PNA.  CTA chest neg for PE but notable for multifocal PNA and pulm edema.  He was admitted and placed on Abx, steroids, inhalers, and antihypertensives.    Since admission, HsTrop has risen (82  163  199  189).  Echo performed 3/25 shows mild LV dysfxn w/ an EF of 45-50%, global HK, mod LVH, and grade 1.  BNP higher this AM @ 744, though dyspnea has improved significantly.   He currently denies c/p but did have some earlier after his IV was flushed.  Inpatient Medications    . amLODipine  10 mg Oral Daily  . aspirin EC  81 mg Oral Daily  . azithromycin  500 mg Oral Daily  . chlorpheniramine-HYDROcodone  5 mL Oral Q12H  . enoxaparin (LOVENOX) injection  40 mg Subcutaneous Q24H  . guaiFENesin-dextromethorphan  10 mL Oral Q6H  . insulin aspart  0-20 Units Subcutaneous Q4H  . insulin detemir  50 Units Subcutaneous BID  . ipratropium-albuterol  3 mL Nebulization QID  . methylPREDNISolone (SOLU-MEDROL) injection  60 mg Intravenous Q8H  . rosuvastatin  10 mg Oral Daily    Family History    Family History  Problem Relation Age of Onset  . Kidney failure Mother        died @ 70  .  Heart failure Mother   . Other Father        he never knew his father   He indicated that his mother is deceased. He indicated that his father is deceased.   Social History    Social History   Socioeconomic History  . Marital status: Married    Spouse name: Not on file  . Number of children: Not on file  . Years of education: Not on file  . Highest education level: Not on file  Occupational History  . Not on file  Tobacco Use  . Smoking status: Never Smoker  . Smokeless tobacco: Never Used  Substance and Sexual Activity  . Alcohol use: Never  . Drug use: Never  . Sexual activity: Not on file  Other Topics Concern  . Not on file  Social History Narrative   Lives locally with wife and son.  He is currently unemployed - has worked in  different industries.     Social Determinants of Health   Financial Resource Strain:   . Difficulty of Paying Living Expenses:   Food Insecurity:   . Worried About Charity fundraiser in the Last Year:   . Arboriculturist in the Last Year:   Transportation Needs:   . Film/video editor (Medical):   Marland Kitchen Lack of Transportation (Non-Medical):   Physical Activity:   . Days of Exercise per Week:   . Minutes of Exercise per Session:   Stress:   . Feeling of Stress :   Social Connections:   . Frequency of Communication with Friends and Family:   . Frequency of Social Gatherings with Friends and Family:   . Attends Religious Services:   . Active Member of Clubs or Organizations:   . Attends Archivist Meetings:   Marland Kitchen Marital Status:   Intimate Partner Violence:   . Fear of Current or Ex-Partner:   . Emotionally Abused:   Marland Kitchen Physically Abused:   . Sexually Abused:      Review of Systems    General:  No chills, fever, night sweats or weight changes.  Cardiovascular:  +++ chest pain, +++ dyspnea on admission, no edema, orthopnea, palpitations, paroxysmal nocturnal dyspnea. Dermatological: No rash, lesions/masses Respiratory: +++ cough, +++ dyspnea Urologic: No hematuria, dysuria Abdominal:   No nausea, vomiting, diarrhea, bright red blood per rectum, melena, or hematemesis Neurologic:  No visual changes, wkns, changes in mental status. All other systems reviewed and are otherwise negative except as noted above.  Physical Exam    Blood pressure (!) 160/90, pulse (!) 104, temperature 98 F (36.7 C), temperature source Oral, resp. rate 18, height 5\' 6"  (1.676 m), weight 72.6 kg, SpO2 98 %.  General: Pleasant, NAD Psych: Normal affect. Neuro: Alert and oriented X 3. Moves all extremities spontaneously. HEENT: Normal  Neck: Supple without bruits or JVD. Lungs:  Resp regular and unlabored, CTA. Heart: RRR no s3, s4. 2/6 syst murmur @ RUSB. Abdomen: Soft, non-tender,  non-distended, BS + x 4.  Extremities: No clubbing, cyanosis or edema. DP/PT/Radials 2+ and equal bilaterally.  Labs    Cardiac Enzymes Recent Labs  Lab 09/11/19 0120 09/11/19 0417 09/11/19 0802 09/11/19 0949  TROPONINIHS 82* 163* 199* 189*      Lab Results  Component Value Date   WBC 11.3 (H) 09/12/2019   HGB 10.6 (L) 09/12/2019   HCT 32.0 (L) 09/12/2019   MCV 87.7 09/12/2019   PLT 292 09/12/2019    Recent Labs  Lab  09/11/19 0120 09/11/19 0417 09/12/19 0449  NA 140   < > 141  K 4.3   < > 4.2  CL 109   < > 111  CO2 24   < > 23  BUN 34*   < > 45*  CREATININE 1.83*   < > 2.02*  CALCIUM 8.6*   < > 8.5*  PROT 6.3*  --   --   BILITOT 0.4  --   --   ALKPHOS 93  --   --   ALT 21  --   --   AST 22  --   --   GLUCOSE 313*   < > 229*   < > = values in this interval not displayed.     Radiology Studies    DG Chest 2 View  Result Date: 09/11/2019 CLINICAL DATA:  Aspiration evaluation EXAM: CHEST - 2 VIEW COMPARISON:  07/28/2009 FINDINGS: Patchy bilateral airspace disease. Heart is normal size. No effusions or pneumothorax. No acute bony abnormality. IMPRESSION: Patchy bilateral airspace disease concerning for pneumonia. Electronically Signed   By: Rolm Baptise M.D.   On: 09/11/2019 01:44   CT Angio Chest PE W and/or Wo Contrast  Result Date: 09/11/2019 CLINICAL DATA:  Shortness of breath EXAM: CT ANGIOGRAPHY CHEST WITH CONTRAST TECHNIQUE: Multidetector CT imaging of the chest was performed using the standard protocol during bolus administration of intravenous contrast. Multiplanar CT image reconstructions and MIPs were obtained to evaluate the vascular anatomy. CONTRAST:  9mL OMNIPAQUE IOHEXOL 350 MG/ML SOLN COMPARISON:  Radiograph same day FINDINGS: Cardiovascular: There is a optimal opacification of the pulmonary arteries. There is no central,segmental, or subsegmental filling defects within the pulmonary arteries. The heart is normal in size. No pericardial effusion or  thickening. No evidence right heart strain. There is normal three-vessel brachiocephalic anatomy without proximal stenosis. The thoracic aorta is normal in appearance. Mediastinum/Nodes: No hilar, mediastinal, or axillary adenopathy. Thyroid gland, trachea, and esophagus demonstrate no significant findings. Lungs/Pleura: There is multifocal fluffy/patchy predominantly centrally based airspace opacities seen throughout both lungs. Interlobular septal thickening is seen at both lung bases. There is a trace right pleural effusion present Upper Abdomen: No acute abnormalities present in the visualized portions of the upper abdomen. Musculoskeletal: No chest wall abnormality. No acute or significant osseous findings. Review of the MIP images confirms the above findings. IMPRESSION: No central, segmental, or subsegmental pulmonary embolism. Multifocal bilateral airspace opacities , likely due to pulmonary edema or infectious etiology. Trace right pleural effusion. Electronically Signed   By: Prudencio Pair M.D.   On: 09/11/2019 05:35    ECG & Cardiac Imaging    Sinus tachycardia, 104, LAE - personally reviewed.  Assessment & Plan    1.  Acute respiratory failure/multifocal CAP:  Admitted w/ sudden onset of dyspnea, wheezing, and hypoxia early on 3/25.  Much improved on abx, steroids, nebs.    2.  Cardiomyopathy:  Echo this admission w/ EF of 45-50%, global HK, LVH, grade 1 diast dysfxn.  Prior Myoview in 2011 w/ EF of 48%, though no echo @ that time.  He denies any prior h/o DOE/edema/orthopnea.  HsTrop also mildly elevated to peak of 199 - suggestive of demand ischemia.  With mild LV dysfxn, tachycardia, and ongoing HTN, will add cardioselective  blocker - bisoprolol.  No acei/arb/mra given AKI (?CKD).  Euvolemic on exam - I do not think he requires a diuretic @ this time.  As he is currently recovering from PNA and is hoping to go  home this weekend, we will plan for early outpt f/u and likely stress testing vs  cath pending renal fxn.  3.  Chest pain/Demand ischemia:  He notes a 3+ month h/o frequent c/p that occurs both @ rest and w/ exertion.  No real predictability to when this might occur.  As above, HsTrop mildly elevated but does not reflect ACS.  Suspect demand ischemia in the setting of hypertensive urgency and resp failure.  Cont asa, statin.  Add  blocker and nitrate.  Will plan ischemic eval in outpt setting following recovery from PNA.  4.  Hypertensive Urgency:  222/123 on arrival in the setting of resp failure.  BPs cont to trend 150s to 170s on amlodipine 10mg  daily.  No longer wheezing. In setting of demand ischemia and mild cardiomyopathy, adding bisoprolol and nitrate.  Can consider hydralazine for recalcitrant hypertension.  5.  AKI w/ ? Of CKD:  Creat 1.83 on admission.  Higher this AM @ 2.02 - did receive contrast in ED - ? Role of CIN.  Avoid nephrotoxic agents.  Follow.  6.  Normocytic anemia:  Relatively stable - ? Prior baseline.  7.  DMII:  Poorly controlled - A1c 9.8.  Per IM.  8.  HL:  Cont statin. F/u lipids as outpt.  Signed, Murray Hodgkins, NP 09/12/2019, 4:19 PM  For questions or updates, please contact   Please consult [email or web address] for contact info under Cardiology/STEMI.

## 2019-09-12 NOTE — Plan of Care (Signed)
Patient admitted to 128 with cell phone and clothes. Electronic spanish interpreter used to complete assessment and education. Patient A&Ox4. No acute respiratory distress on 2L's [place]. He has a round tennis ball sized darkened spot on his right foot that he reports came from dropping something on it over 10 years ago. No other skin issues. Cardiac monitoring initiated. Patient reporting congestion, head ache and chest pain with inhalation. Declined PRN for pain when offered. Scheduled medications for congestion administered.  Will continue to monitor.

## 2019-09-12 NOTE — Progress Notes (Addendum)
PROGRESS NOTE    Patient: Jimmy Olson                            PCP: System, Pcp Not In                    DOB: 04/02/1961            DOA: 09/11/2019 [id number]             DOS: 09/12/2019, 1:00 PM   LOS: 1 day   Date of Service: The patient was seen and examined on 09/12/2019  Subjective:   The patient was seen and examined this morning, his wife was present at bedside. Still complaining shortness of breath, remains on 3 L of oxygen, to maintain O2 sat greater 90%, currently satting 95%. No issues overnight.   Brief Narrative:   a 59 y.o. Hispanic male with a known history of type II diabetes mellitus and hypertension, presented to the emergency room with acute onset of dyspnea with a feeling of chest closing up as well as wheezing.  Upon presentation patient was hypertensive with 222/123, hypoxic satting 91% on room air, SARS-CoV-2 negative, influenza A/B negative, chest x-ray and CTA of the chest consistent with multifocal pneumonia, pulmonary edema. Started on supplement oxygen, IV antibiotics, cultures were obtained.   Assessment & Plan:   Principal Problem:   Acute respiratory failure with hypercapnia (HCC) Active Problems:   CAP (community acquired pneumonia)   Accelerated hypertension   Type 2 diabetes mellitus with hyperlipidemia (HCC)   Type 2 diabetes mellitus with other specified complication ([place])   AKI (acute kidney injury) ([place])   Acute respiratory distress due to multifocal pneumonia  -Remains in acute respiratory distress, requiring 3 L of oxygen, currently satting 95%. The goal be to taper him down back to his baseline of oxygen -Continue DuoNeb bronchodilator treatments -IV steroids, antibiotics (due to no change in respite distress, IV steroid was increased to Solu-Medrol 60 every 8 hours, may taper down and next 12-24 hours) - Treating underlying cause of pneumonia with IV antibiotics   multifocal community-acquired pneumonia. -Blood cultures  x2 - to date, sputum cultures negative -SARS-CoV-2, influenza A/B negative -Chest x-ray CTA was reviewed negative for pulmonary embolism, possible pulmonary edema, multifocal pneumonia -continue  IV Rocephin and Zithromax. -Mucolytics will be provided and DuoNebs on a scheduled and as needed basis. --Due to respiratory distress brief course of steroids will be added   Hypertensive urgency. -Monitor closely, blood pressure has improved from admission -We will continue his antihypertensives when available. -Home medication of Norvasc 10 mg p.o. will be added, with holding lisinopril due to mild AKI -As needed IV hydralazine will be added  Uncontrolled type 2 diabetes mellitus with hyperglycemia. -Anticipating further hypoglycemia due to addition of the steroids, -We will check CBG QA CHS -We will hold off his Metformin. -Patient home medication of Levemir will be added along with SSI  Hyperlipidemia -Continue statins  New diagnosis of diastolic CHF stage I /gray zone dated troponin -Today cardiac reviewed: EGF 45-50%, mild decrease LV function, moderate global hypokinesis moderate left ventricular hypertrophy Left ventricle diastolic dysfunction grade 1 -IV fluids were DC'd, considering IV Lasix if shortness of breath gets worse -We will continue monitor I's and O's, daily weight -Mildly elevated troponin, concerning to be ischemic demand in setting of respiratory failure -have any chest pain, no EKG changes -Continue supportive therapy, including as needed nitroglycerin  morphine Cardiology consulted -appreciate evaluation and input    Nutritional status:         Cultures; 09/10/2019 blood/sputum culture >>  Antimicrobials: 09/10/2019 IV antibiotics azithromycin/Rocephin >>  ==================================================================  DVT prophylaxis: SCD/Compression stockings and Lovenox SQ Code Status:   Code Status: Full Code Family Communication: No family  member present at bedside- attempt will be made to update daily The above findings and plan of care has been discussed with patient and family in detail,  they expressed understanding and agreement of above. Disposition Plan:   Anticipated 1-2 days Admission status:  NPATIEN -due to acute respiratory distress, multifocal pneumonia,  Consultants: None    Imaging:  Chest x-ray: Reviewed patchy bilateral airspace disease finding consistent with pneumonia  CT angiogram -Negative pulmonary embolism Scattered opacity consistent with multifocal pneumonia, pulmonary edema   Procedures:   No admission procedures for hospital encounter.     Antimicrobials:  Anti-infectives (From admission, onward)   Start     Dose/Rate Route Frequency Ordered Stop   09/12/19 0845  azithromycin (ZITHROMAX) tablet 500 mg     500 mg Oral Daily 09/12/19 0841 09/16/19 0959   09/12/19 0800  azithromycin (ZITHROMAX) 500 mg in sodium chloride 0.9 % 250 mL IVPB  Status:  Discontinued     500 mg 250 mL/hr over 60 Minutes Intravenous Every 24 hours 09/11/19 0335 09/12/19 0841   09/11/19 1500  cefTRIAXone (ROCEPHIN) 2 g in sodium chloride 0.9 % 100 mL IVPB     2 g 200 mL/hr over 30 Minutes Intravenous Every 24 hours 09/11/19 0335 09/16/19 1459   09/11/19 0300  cefTRIAXone (ROCEPHIN) 1 g in sodium chloride 0.9 % 100 mL IVPB     1 g 200 mL/hr over 30 Minutes Intravenous  Once 09/11/19 0248 09/11/19 0416   09/11/19 0300  azithromycin (ZITHROMAX) 500 mg in sodium chloride 0.9 % 250 mL IVPB     500 mg 250 mL/hr over 60 Minutes Intravenous  Once 09/11/19 0248 09/11/19 0734       Medication:  . amLODipine  10 mg Oral Daily  . aspirin EC  81 mg Oral Daily  . azithromycin  500 mg Oral Daily  . chlorpheniramine-HYDROcodone  5 mL Oral Q12H  . enoxaparin (LOVENOX) injection  40 mg Subcutaneous Q24H  . guaiFENesin-dextromethorphan  10 mL Oral Q6H  . insulin aspart  0-20 Units Subcutaneous Q4H  . insulin detemir   45 Units Subcutaneous BID  . ipratropium-albuterol  3 mL Nebulization QID  . methylPREDNISolone (SOLU-MEDROL) injection  60 mg Intravenous Q12H  . rosuvastatin  10 mg Oral Daily    acetaminophen **OR** acetaminophen, hydrALAZINE, labetalol, magnesium hydroxide, ondansetron **OR** ondansetron (ZOFRAN) IV, traZODone   Objective:   Vitals:   09/11/19 2312 09/12/19 0726 09/12/19 0759 09/12/19 1105  BP: (!) 175/94  (!) 160/90   Pulse: (!) 109  (!) 104   Resp: 18  18   Temp: 98.5 F (36.9 C)  98 F (36.7 C)   TempSrc:   Oral   SpO2: 98% 97% 100% 95%  Weight:      Height:        Intake/Output Summary (Last 24 hours) at 09/12/2019 1300 Last data filed at 09/12/2019 0800 Gross per 24 hour  Intake 1533.33 ml  Output 800 ml  Net 733.33 ml   Filed Weights   09/11/19 0127  Weight: 72.6 kg     Examination:   BP (!) 160/90 (BP Location: Right Arm)   Pulse (!) 104  Temp 98 F (36.7 C) (Oral)   Resp 18   Ht 5\' 6"  (1.676 m)   Wt 72.6 kg   SpO2 95%   BMI 25.82 kg/m    Physical Exam  Constitution:  Alert, cooperative, no distress,  Psychiatric: Normal and stable mood and affect, cognition intact,   HEENT: Normocephalic, PERRL, otherwise with in Normal limits  Chest:Chest symmetric Cardio vascular:  S1/S2, RRR, No murmure, No Rubs or Gallops  pulmonary: Scattered wheezing and rhonchi, negative crackles, Abdomen: Soft, non-tender, non-distended, bowel sounds,no masses, no organomegaly Muscular skeletal: Limited exam - in bed, able to move all 4 extremities, Normal strength,  Neuro: CNII-XII intact. , normal motor and sensation, reflexes intact  Extremities: No pitting edema lower extremities, +2 pulses  Skin: Dry, warm to touch, negative for any Rashes, No open wounds Wounds: per nursing documentation           LABs:  CBC Latest Ref Rng & Units 09/12/2019 09/11/2019 09/11/2019  WBC 4.0 - 10.5 K/uL 11.3(H) 7.6 6.7  Hemoglobin 13.0 - 17.0 g/dL 10.6(L) 10.5(L) 11.3(L)    Hematocrit 39.0 - 52.0 % 32.0(L) 31.8(L) 33.2(L)  Platelets 150 - 400 K/uL 292 274 269   CMP Latest Ref Rng & Units 09/12/2019 09/11/2019 09/11/2019  Glucose 70 - 99 mg/dL 229(H) 260(H) 313(H)  BUN 6 - 20 mg/dL 45(H) 36(H) 34(H)  Creatinine 0.61 - 1.24 mg/dL 2.02(H) 1.82(H) 1.83(H)  Sodium 135 - 145 mmol/L 141 141 140  Potassium 3.5 - 5.1 mmol/L 4.2 4.6 4.3  Chloride 98 - 111 mmol/L 111 109 109  CO2 22 - 32 mmol/L 23 27 24   Calcium 8.9 - 10.3 mg/dL 8.5(L) 8.5(L) 8.6(L)  Total Protein 6.5 - 8.1 g/dL - - 6.3(L)  Total Bilirubin 0.3 - 1.2 mg/dL - - 0.4  Alkaline Phos 38 - 126 U/L - - 93  AST 15 - 41 U/L - - 22  ALT 0 - 44 U/L - - 21        SIGNED: Deatra James, MD, FACP, FHM. [facility],  Pager [phone]  If 7PM-7AM, please contact night-coverage [email or web address], Password [facility] 09/12/2019, 1:00 PM

## 2019-09-12 NOTE — Progress Notes (Signed)
Results for ALBIE, ARIZPE (MRN [id number]) as of 09/12/2019 12:14  Ref. Range 09/11/2019 21:35 09/11/2019 21:38 09/11/2019 23:32 09/12/2019 04:04 09/12/2019 11:48  Glucose-Capillary Latest Ref Range: 70 - 99 mg/dL 465 (H) 458 (H) 452 (H) 241 (H) 176 (H)  Noted that patient's blood sugars continue to be greater than 180 mg/dl.  Recommend changing Novolog RESISTANT correction scale to TID & HS scale since eating CHO MOD diet and adding Novolog 4 units TID with meals if postprandial blood sugars continue to be elevated.   Harvel Ricks RN BSN CDE Diabetes Coordinator Pager: [phone]  8am-5pm

## 2019-09-13 DIAGNOSIS — J9601 Acute respiratory failure with hypoxia: Secondary | ICD-10-CM | POA: Diagnosis present

## 2019-09-13 DIAGNOSIS — R079 Chest pain, unspecified: Secondary | ICD-10-CM

## 2019-09-13 DIAGNOSIS — I130 Hypertensive heart and chronic kidney disease with heart failure and stage 1 through stage 4 chronic kidney disease, or unspecified chronic kidney disease: Secondary | ICD-10-CM

## 2019-09-13 DIAGNOSIS — J9621 Acute and chronic respiratory failure with hypoxia: Secondary | ICD-10-CM

## 2019-09-13 LAB — GLUCOSE, CAPILLARY
Glucose-Capillary: 103 mg/dL — ABNORMAL HIGH (ref 70–99)
Glucose-Capillary: 145 mg/dL — ABNORMAL HIGH (ref 70–99)
Glucose-Capillary: 213 mg/dL — ABNORMAL HIGH (ref 70–99)
Glucose-Capillary: 282 mg/dL — ABNORMAL HIGH (ref 70–99)
Glucose-Capillary: 357 mg/dL — ABNORMAL HIGH (ref 70–99)
Glucose-Capillary: 56 mg/dL — ABNORMAL LOW (ref 70–99)
Glucose-Capillary: 87 mg/dL (ref 70–99)
Glucose-Capillary: 96 mg/dL (ref 70–99)

## 2019-09-13 LAB — CBC
HCT: 29.3 % — ABNORMAL LOW (ref 39.0–52.0)
Hemoglobin: 9.7 g/dL — ABNORMAL LOW (ref 13.0–17.0)
MCH: 29.1 pg (ref 26.0–34.0)
MCHC: 33.1 g/dL (ref 30.0–36.0)
MCV: 88 fL (ref 80.0–100.0)
Platelets: 278 10*3/uL (ref 150–400)
RBC: 3.33 MIL/uL — ABNORMAL LOW (ref 4.22–5.81)
RDW: 12.2 % (ref 11.5–15.5)
WBC: 12.5 10*3/uL — ABNORMAL HIGH (ref 4.0–10.5)
nRBC: 0 % (ref 0.0–0.2)

## 2019-09-13 LAB — BRAIN NATRIURETIC PEPTIDE: B Natriuretic Peptide: 477 pg/mL — ABNORMAL HIGH (ref 0.0–100.0)

## 2019-09-13 LAB — BASIC METABOLIC PANEL
Anion gap: 7 (ref 5–15)
BUN: 55 mg/dL — ABNORMAL HIGH (ref 6–20)
CO2: 23 mmol/L (ref 22–32)
Calcium: 8.5 mg/dL — ABNORMAL LOW (ref 8.9–10.3)
Chloride: 113 mmol/L — ABNORMAL HIGH (ref 98–111)
Creatinine, Ser: 2.04 mg/dL — ABNORMAL HIGH (ref 0.61–1.24)
GFR calc Af Amer: 41 mL/min — ABNORMAL LOW (ref >60)
GFR calc non Af Amer: 35 mL/min — ABNORMAL LOW (ref >60)
Glucose, Bld: 85 mg/dL (ref 70–99)
Potassium: 4.2 mmol/L (ref 3.5–5.1)
Sodium: 143 mmol/L (ref 135–145)

## 2019-09-13 LAB — PROCALCITONIN: Procalcitonin: <0.1 ng/mL

## 2019-09-13 LAB — MYCOPLASMA PNEUMONIAE ANTIBODY, IGM: Mycoplasma pneumo IgM: <770 U/mL (ref 0–769)

## 2019-09-13 LAB — LEGIONELLA PNEUMOPHILA SEROGP 1 UR AG: L. pneumophila Serogp 1 Ur Ag: NEGATIVE

## 2019-09-13 MED ORDER — METHYLPREDNISOLONE SODIUM SUCC 125 MG IJ SOLR
60.0000 mg | Freq: Two times a day (BID) | INTRAMUSCULAR | Status: DC
  Administered 2019-09-13 – 2019-09-14 (×2): 60 mg via INTRAVENOUS
  Filled 2019-09-13 (×2): qty 2

## 2019-09-13 MED ORDER — IPRATROPIUM-ALBUTEROL 0.5-2.5 (3) MG/3ML IN SOLN: 3.0000 mL | Freq: Four times a day (QID) | RESPIRATORY_TRACT | Status: DC

## 2019-09-13 MED ORDER — IPRATROPIUM-ALBUTEROL 0.5-2.5 (3) MG/3ML IN SOLN
3.0000 mL | Freq: Three times a day (TID) | RESPIRATORY_TRACT | Status: DC
  Administered 2019-09-13: 3 mL via RESPIRATORY_TRACT
  Filled 2019-09-13: qty 3

## 2019-09-13 MED ORDER — IPRATROPIUM-ALBUTEROL 0.5-2.5 (3) MG/3ML IN SOLN: 3.0000 mL | Freq: Four times a day (QID) | RESPIRATORY_TRACT | Status: DC | PRN

## 2019-09-13 MED ORDER — LACTATED RINGERS IV SOLN: INTRAVENOUS | Status: DC

## 2019-09-13 NOTE — Plan of Care (Signed)
Elevated BS early in shift then 0400 check 56. Patient reporting blurred vision. Provided with milk and crackers. Rechecked and BS currently 87. Patient will remain on Q4 checks throughout shift. Will continue to monitor.   Problem: Activity: Goal: Ability to tolerate increased activity will improve Outcome: Progressing   Problem: Clinical Measurements: Goal: Ability to maintain a body temperature in the normal range will improve Outcome: Progressing   Problem: Respiratory: Goal: Ability to maintain adequate ventilation will improve Outcome: Progressing Goal: Ability to maintain a clear airway will improve Outcome: Progressing   Problem: Education: Goal: Knowledge of General Education information will improve Description: Including pain rating scale, medication(s)/side effects and non-pharmacologic comfort measures Outcome: Progressing   Problem: Clinical Measurements: Goal: Respiratory complications will improve Outcome: Progressing

## 2019-09-13 NOTE — Progress Notes (Signed)
SATURATION QUALIFICATIONS: (This note is used to comply with regulatory documentation for home oxygen)  Patient Saturations on Room Air at Rest = 99%  Patient Saturations on Room Air while Ambulating = 98%  Patient Saturations on 0 Liters of oxygen while Ambulating = 98%  Please briefly explain why patient needs home oxygen: 

## 2019-09-13 NOTE — Progress Notes (Addendum)
PROGRESS NOTE    Jimmy Olson  RSW:[id number] DOB: 1961/02/18 DOA: 09/11/2019  PCP: System, Pcp Not In    LOS - 2   Brief Narrative:  59 y.o. Hispanicmalewith a known history of type IIdiabetes mellitus and hypertension, presented to the emergency room with acute onset of dyspnea with a feeling of chest closing up as well as wheezing. Upon presentation patient was hypertensive with 222/123, hypoxic satting 91% on room air, SARS-CoV-2 negative, influenza A/B negative, chest x-ray and CTA of the chest consistent with multifocal pneumonia, pulmonary edema.  Started on supplement oxygen, IV antibiotics, cultures were obtained.  Subjective 3/27: Patient seen at bedside this morning.  He reported having active chest discomfort.  Describes frequent episodes of chest pain that radiated to his back and head, approximately 3 times daily.  Works in [employer] and experiences chest pain at work.  This has been ongoing for quite some time.  Reports breathing improved, denies shortness of breath, nausea vomiting diarrhea or other complaints.  He states would prefer to have Myoview stress test while he is here as he is concerned about heart problems that will keep him from returning to work.    Assessment & Plan:   Principal Problem:   Acute on chronic respiratory failure with hypoxia (HCC) Active Problems:   CAP (community acquired pneumonia)   Acute respiratory failure with hypercapnia ([place])   Accelerated hypertension   Type 2 diabetes mellitus with hyperlipidemia (HCC)   Type 2 diabetes mellitus with other specified complication (HCC)   AKI (acute kidney injury) ([place])   Acute respiratory failure with hypoxia due to  Multifocal community-acquired pneumonia COVID-19, influenza a/B were negative on admission.  Blood culture no growth to date.  Sputum cultures negative.  CTA chest was also obtained and ruled out PE but also showed multifocal pneumonia as did the chest x-ray. Remains on 2 L/min  oxygen, currently satting in mid to upper 90s.   --Continue DuoNebs --Continue Rocephin and azithromycin --Continue Solu-Medrol, decreased to 60 mg twice daily --Continue antitussives/mucolytic's --Taper steroids, possibly transition to prednisone tomorrow, will treat for 3-5 days depending on clinical improvement --Supplemental oxygen to maintain O2 sat greater than 90%, wean down as tolerated  Acute kidney injury -present on admission with creatinine 1.83.  This has actually worsened slightly to creatinine of 2.04 today.  Suspect prerenal in the setting of dehydration due to acute illness, however patient did receive IV contrast for CT scan on admission.  Unknown if history of CKD, no baseline available.  With history of uncontrolled diabetes and uncontrolled hypertension, suspect he does have some element of CKD. --Will start gentle IV hydration --Monitor respiratory and volume status closely, given mildly reduced EF --Serial BMPs  New onset systolic CHF -echo showed EF of 45 to 50%, moderate global hypokinesis and significant LVH, grade 1 diastolic dysfunction. Elevated troponin -present on admission.  Cardiology attributes to demand ischemia in the setting of hypoxia, however patient gives history of chest pain episodes concerning for ischemia. --Cardiology following --Plan for Myoview stress test on Monday if patient still admitted versus outpatient --Continue beta-blocker --Home lisinopril, Metformin held due to AKI  Hypertensive urgency -present on admission.   Essential hypertension -home regimen appears to be amlodipine 10 mg, lisinopril 30 mg. --Continue home amlodipine --Hold lisinopril due to AKI --As needed hydralazine  Uncontrolled type 2 diabetes mellitus with hyperglycemia -uncontrolled.  A1c 9.8%. -Anticipating further hypERglycemia due to steroids --Monitor blood glucose -Hold Metformin --Continue home Levemir --Sliding scale NovoLog --  Titrate mealtime insulin as  needed for steroid-induced postprandial hyperglycemia  Hyperlipidemia --Continue statin   DVT prophylaxis: Lovenox   Code Status: Full Code  Family Communication: None at bedside during encounter, will attempt to call  Disposition Plan: Expect discharge to prior home environment pending further clinical improvement and stress test, likely at least 2-3 more days Coming From home Exp DC Date 3/30 Barriers clinical condition, ongoing eval Medically Stable for Discharge?  No  Consultants:   Cardiology  Procedures:   Echocardiogram EF 45 to 34%, grade 1 diastolic dysfunction, moderate LVH, moderate global LV hypokinesis  Antimicrobials:   Rocephin, azithromycin 3/25 >>   Objective: Vitals:   09/13/19 0726 09/13/19 0727 09/13/19 1226 09/13/19 1302  BP:  (!) 155/86    Pulse:  79    Resp:  18 18   Temp:  97.6 F (36.4 C)    TempSrc:  Axillary    SpO2: 98% 100% 96% 97%  Weight:      Height:        Intake/Output Summary (Last 24 hours) at 09/13/2019 1521 Last data filed at 09/13/2019 1415 Gross per 24 hour  Intake 360 ml  Output --  Net 360 ml   Filed Weights   09/11/19 0127  Weight: 72.6 kg    Examination:  General exam: awake, alert, mildly ill-appearing HEENT: atraumatic, clear conjunctiva, anicteric sclera, moist mucus membranes, hearing grossly normal  Respiratory system: Decreased breath sounds, no wheezes, rales or rhonchi, normal respiratory effort. Cardiovascular system: normal S1/S2, RRR, no pedal edema.   Gastrointestinal system: soft, NT, ND Central nervous system: A&O x4. no gross focal neurologic deficits, normal speech Extremities: moves all, no edema, normal tone Psychiatry: normal mood, congruent affect, judgement and insight appear normal    Data Reviewed: I have personally reviewed following labs and imaging studies  CBC: Recent Labs  Lab 09/11/19 0120 09/11/19 0417 09/12/19 0449 09/13/19 0521  WBC 6.7 7.6 11.3* 12.5*  NEUTROABS  4.1  --   --   --   HGB 11.3* 10.5* 10.6* 9.7*  HCT 33.2* 31.8* 32.0* 29.3*  MCV 86.2 88.6 87.7 88.0  PLT 269 274 292 742   Basic Metabolic Panel: Recent Labs  Lab 09/11/19 0120 09/11/19 0417 09/12/19 0449 09/13/19 0521  NA 140 141 141 143  K 4.3 4.6 4.2 4.2  CL 109 109 111 113*  CO2 24 27 23 23   GLUCOSE 313* 260* 229* 85  BUN 34* 36* 45* 55*  CREATININE 1.83* 1.82* 2.02* 2.04*  CALCIUM 8.6* 8.5* 8.5* 8.5*   GFR: Estimated Creatinine Clearance: 36.5 mL/min (A) (by C-G formula based on SCr of 2.04 mg/dL (H)). Liver Function Tests: Recent Labs  Lab 09/11/19 0120  AST 22  ALT 21  ALKPHOS 93  BILITOT 0.4  PROT 6.3*  ALBUMIN 2.8*   No results for input(s): LIPASE, AMYLASE in the last 168 hours. No results for input(s): AMMONIA in the last 168 hours. Coagulation Profile: No results for input(s): INR, PROTIME in the last 168 hours. Cardiac Enzymes: No results for input(s): CKTOTAL, CKMB, CKMBINDEX, TROPONINI in the last 168 hours. BNP (last 3 results) No results for input(s): PROBNP in the last 8760 hours. HbA1C: Recent Labs    09/11/19 0417  HGBA1C 9.8*   CBG: Recent Labs  Lab 09/13/19 0000 09/13/19 0425 09/13/19 0520 09/13/19 0730 09/13/19 1138  GLUCAP 145* 56* 87 96 213*   Lipid Profile: No results for input(s): CHOL, HDL, LDLCALC, TRIG, CHOLHDL, LDLDIRECT in the last 72  hours. Thyroid Function Tests: No results for input(s): TSH, T4TOTAL, FREET4, T3FREE, THYROIDAB in the last 72 hours. Anemia Panel: No results for input(s): VITAMINB12, FOLATE, FERRITIN, TIBC, IRON, RETICCTPCT in the last 72 hours. Sepsis Labs: Recent Labs  Lab 09/11/19 0120 09/12/19 0449 09/13/19 0521  PROCALCITON <0.10 0.11 <0.10    Recent Results (from the past 240 hour(s))  Respiratory Panel by RT PCR (Flu A&B, Covid) - Nasopharyngeal Swab     Status: None   Collection Time: 09/11/19  1:20 AM   Specimen: Nasopharyngeal Swab  Result Value Ref Range Status   SARS  Coronavirus 2 by RT PCR NEGATIVE NEGATIVE Final    Comment: (NOTE) SARS-CoV-2 target nucleic acids are NOT DETECTED. The SARS-CoV-2 RNA is generally detectable in upper respiratoy specimens during the acute phase of infection. The lowest concentration of SARS-CoV-2 viral copies this assay can detect is 131 copies/mL. A negative result does not preclude SARS-Cov-2 infection and should not be used as the sole basis for treatment or other patient management decisions. A negative result may occur with  improper specimen collection/handling, submission of specimen other than nasopharyngeal swab, presence of viral mutation(s) within the areas targeted by this assay, and inadequate number of viral copies (<131 copies/mL). A negative result must be combined with clinical observations, patient history, and epidemiological information. The expected result is Negative. Fact Sheet for Patients:  PinkCheek.be Fact Sheet for Healthcare Providers:  GravelBags.it This test is not yet ap proved or cleared by the [place] FDA and  has been authorized for detection and/or diagnosis of SARS-CoV-2 by FDA under an Emergency Use Authorization (EUA). This EUA will remain  in effect (meaning this test can be used) for the duration of the COVID-19 declaration under Section 564(b)(1) of the Act, 21 U.S.C. section 360bbb-3(b)(1), unless the authorization is terminated or revoked sooner.    Influenza A by PCR NEGATIVE NEGATIVE Final   Influenza B by PCR NEGATIVE NEGATIVE Final    Comment: (NOTE) The Xpert Xpress SARS-CoV-2/FLU/RSV assay is intended as an aid in  the diagnosis of influenza from Nasopharyngeal swab specimens and  should not be used as a sole basis for treatment. Nasal washings and  aspirates are unacceptable for Xpert Xpress SARS-CoV-2/FLU/RSV  testing. Fact Sheet for Patients: PinkCheek.be Fact Sheet  for Healthcare Providers: GravelBags.it This test is not yet approved or cleared by the [place] FDA and  has been authorized for detection and/or diagnosis of SARS-CoV-2 by  FDA under an Emergency Use Authorization (EUA). This EUA will remain  in effect (meaning this test can be used) for the duration of the  Covid-19 declaration under Section 564(b)(1) of the Act, 21  U.S.C. section 360bbb-3(b)(1), unless the authorization is  terminated or revoked. Performed at [facility], [address] [place], [name] [address]   CULTURE, BLOOD (ROUTINE X 2) w Reflex to ID Panel     Status: None (Preliminary result)   Collection Time: 09/11/19  6:36 AM   Specimen: BLOOD  Result Value Ref Range Status   Specimen Description BLOOD RIGHT [facility]  Final   Special Requests   Final    BOTTLES DRAWN AEROBIC AND ANAEROBIC Blood Culture adequate volume   Culture   Final    NO GROWTH 2 DAYS Performed at [facility], [address] [place], [place] [address]    Report Status PENDING  Incomplete  CULTURE, BLOOD (ROUTINE X 2) w Reflex to ID Panel     Status: None (Preliminary result)  Collection Time: 09/11/19  6:36 AM   Specimen: BLOOD  Result Value Ref Range Status   Specimen Description BLOOD RIGHT [facility]  Final   Special Requests   Final    BOTTLES DRAWN AEROBIC AND ANAEROBIC Blood Culture adequate volume   Culture   Final    NO GROWTH 2 DAYS Performed at [facility], [address], [place], [place] [address]    Report Status PENDING  Incomplete         Radiology Studies: No results found.      Scheduled Meds: . amLODipine  10 mg Oral Daily  . aspirin EC  81 mg Oral Daily  . azithromycin  500 mg Oral Daily  . bisoprolol  5 mg Oral Daily  . chlorpheniramine-HYDROcodone  5 mL Oral Q12H  . enoxaparin (LOVENOX) injection  40 mg Subcutaneous Q24H  . guaiFENesin-dextromethorphan  10 mL Oral Q6H  . insulin aspart  0-20 Units  Subcutaneous Q4H  . insulin detemir  50 Units Subcutaneous BID  . ipratropium-albuterol  3 mL Nebulization TID  . isosorbide mononitrate  30 mg Oral Daily  . methylPREDNISolone (SOLU-MEDROL) injection  60 mg Intravenous Q8H  . rosuvastatin  10 mg Oral Daily   Continuous Infusions: . cefTRIAXone (ROCEPHIN)  IV 2 g (09/12/19 1619)     LOS: 2 days    Time spent: 73 minutes    Ezekiel Slocumb, DO [facility]   If 7PM-7AM, please contact night-coverage [email or web address] 09/13/2019, 3:21 PM

## 2019-09-13 NOTE — Progress Notes (Signed)
Progress Note   Subjective   Doing well today, the patient denies CP or SOB.  No new concerns  Inpatient Medications    Scheduled Meds: . amLODipine  10 mg Oral Daily  . aspirin EC  81 mg Oral Daily  . azithromycin  500 mg Oral Daily  . bisoprolol  5 mg Oral Daily  . chlorpheniramine-HYDROcodone  5 mL Oral Q12H  . enoxaparin (LOVENOX) injection  40 mg Subcutaneous Q24H  . guaiFENesin-dextromethorphan  10 mL Oral Q6H  . insulin aspart  0-20 Units Subcutaneous Q4H  . insulin detemir  50 Units Subcutaneous BID  . ipratropium-albuterol  3 mL Nebulization TID  . isosorbide mononitrate  30 mg Oral Daily  . methylPREDNISolone (SOLU-MEDROL) injection  60 mg Intravenous Q8H  . rosuvastatin  10 mg Oral Daily   Continuous Infusions: . cefTRIAXone (ROCEPHIN)  IV 2 g (09/12/19 1619)   PRN Meds: acetaminophen **OR** acetaminophen, hydrALAZINE, labetalol, magnesium hydroxide, nitroGLYCERIN, ondansetron **OR** ondansetron (ZOFRAN) IV, traZODone   Vital Signs    Vitals:   09/12/19 1959 09/13/19 0004 09/13/19 0726 09/13/19 0727  BP:  (!) 159/95  (!) 155/86  Pulse:  92  79  Resp:  17  18  Temp:  98.5 F (36.9 C)  97.6 F (36.4 C)  TempSrc:  Oral  Axillary  SpO2: 96% 97% 98% 100%  Weight:      Height:        Intake/Output Summary (Last 24 hours) at 09/13/2019 1203 Last data filed at 09/13/2019 1014 Gross per 24 hour  Intake 240 ml  Output -  Net 240 ml   Filed Weights   09/11/19 0127  Weight: 72.6 kg    Telemetry    sinus - Personally Reviewed  Physical Exam   GEN- The patient is well appearing, alert and oriented x 3 today.   Head- normocephalic, atraumatic Eyes-  Sclera clear, conjunctiva pink Ears- hearing intact Oropharynx- clear Neck- supple, Lungs-  normal work of breathing Heart- Regular rate and rhythm  GI- soft  Extremities- no clubbing, cyanosis, or edema  MS- no significant deformity or atrophy Skin- no rash or lesion Psych- euthymic mood, full  affect Neuro- strength and sensation are intact   Labs    Chemistry Recent Labs  Lab 09/11/19 0120 09/11/19 0120 09/11/19 0417 09/12/19 0449 09/13/19 0521  NA 140   < > 141 141 143  K 4.3   < > 4.6 4.2 4.2  CL 109   < > 109 111 113*  CO2 24   < > 27 23 23   GLUCOSE 313*   < > 260* 229* 85  BUN 34*   < > 36* 45* 55*  CREATININE 1.83*   < > 1.82* 2.02* 2.04*  CALCIUM 8.6*   < > 8.5* 8.5* 8.5*  PROT 6.3*  --   --   --   --   ALBUMIN 2.8*  --   --   --   --   AST 22  --   --   --   --   ALT 21  --   --   --   --   ALKPHOS 93  --   --   --   --   BILITOT 0.4  --   --   --   --   GFRNONAA 40*   < > 41* 36* 35*  GFRAA 47*   < > 47* 41* 41*  ANIONGAP 7   < > 5 7  7   < > = values in this interval not displayed.     Hematology Recent Labs  Lab 09/11/19 0417 09/12/19 0449 09/13/19 0521  WBC 7.6 11.3* 12.5*  RBC 3.59* 3.65* 3.33*  HGB 10.5* 10.6* 9.7*  HCT 31.8* 32.0* 29.3*  MCV 88.6 87.7 88.0  MCH 29.2 29.0 29.1  MCHC 33.0 33.1 33.1  RDW 11.8 11.9 12.2  PLT 274 292 278    Patient Profile    Jimmy Olson is a 59 y.o. male with a history of DMII, HTN, and prior normal stress test, who is being seen today for the evaluation of dyspnea, PNA, hypertensive urgency, LV dysfunction, and HsTroponin elevation at the request of Dr. Roger Shelter.     Assessment & Plan    1.  Chest pain/ demand ischemia Both typical and atypical features.  Chest pain may have been partially related to pneumonia/ coughing He also had demand ischemia in the setting of hypertensive urgency and pneumonia. Labs are not consistent with acute mi Continue current therapy Will plan myoview, though this is probably best to be performed after pneumonia has resolved as an outpatient.  2. Hypertensive urgency Improved Continue to titrate antihypertensive medicine  3. Cardiomyopathy Mildly reduced EF Likely hypertensive in nature Outpatient myoview as above Would not advise ACEi/ARB at this time due  to renal failure.  As renal function improves, ACEi/ARB would be essential for long term renal protection and treatement of CHF.  This can be added as an oupatient.  4. Acute on ? chronic renal failure Stable Likely worsened by hypertension No change required today  5. HL Continue statin  Ok to discharge from cardiology standpoint with close outpatient followup Cardiology to be available as needed over the weekend.  Thompson Grayer MD, [facility] 09/13/2019 12:03 PM

## 2019-09-14 DIAGNOSIS — N179 Acute kidney failure, unspecified: Secondary | ICD-10-CM

## 2019-09-14 LAB — BASIC METABOLIC PANEL
Anion gap: 8 (ref 5–15)
BUN: 60 mg/dL — ABNORMAL HIGH (ref 6–20)
CO2: 21 mmol/L — ABNORMAL LOW (ref 22–32)
Calcium: 8.5 mg/dL — ABNORMAL LOW (ref 8.9–10.3)
Chloride: 109 mmol/L (ref 98–111)
Creatinine, Ser: 1.88 mg/dL — ABNORMAL HIGH (ref 0.61–1.24)
GFR calc Af Amer: 45 mL/min — ABNORMAL LOW (ref >60)
GFR calc non Af Amer: 39 mL/min — ABNORMAL LOW (ref >60)
Glucose, Bld: 101 mg/dL — ABNORMAL HIGH (ref 70–99)
Potassium: 4.2 mmol/L (ref 3.5–5.1)
Sodium: 138 mmol/L (ref 135–145)

## 2019-09-14 LAB — CBC
HCT: 30.2 % — ABNORMAL LOW (ref 39.0–52.0)
Hemoglobin: 9.9 g/dL — ABNORMAL LOW (ref 13.0–17.0)
MCH: 29.1 pg (ref 26.0–34.0)
MCHC: 32.8 g/dL (ref 30.0–36.0)
MCV: 88.8 fL (ref 80.0–100.0)
Platelets: 292 10*3/uL (ref 150–400)
RBC: 3.4 MIL/uL — ABNORMAL LOW (ref 4.22–5.81)
RDW: 12.2 % (ref 11.5–15.5)
WBC: 13.4 10*3/uL — ABNORMAL HIGH (ref 4.0–10.5)
nRBC: 0 % (ref 0.0–0.2)

## 2019-09-14 LAB — GLUCOSE, CAPILLARY
Glucose-Capillary: 121 mg/dL — ABNORMAL HIGH (ref 70–99)
Glucose-Capillary: 163 mg/dL — ABNORMAL HIGH (ref 70–99)
Glucose-Capillary: 188 mg/dL — ABNORMAL HIGH (ref 70–99)
Glucose-Capillary: 284 mg/dL — ABNORMAL HIGH (ref 70–99)
Glucose-Capillary: 294 mg/dL — ABNORMAL HIGH (ref 70–99)
Glucose-Capillary: 31 mg/dL — CL (ref 70–99)
Glucose-Capillary: 318 mg/dL — ABNORMAL HIGH (ref 70–99)
Glucose-Capillary: 64 mg/dL — ABNORMAL LOW (ref 70–99)

## 2019-09-14 LAB — BRAIN NATRIURETIC PEPTIDE: B Natriuretic Peptide: 499 pg/mL — ABNORMAL HIGH (ref 0.0–100.0)

## 2019-09-14 MED ORDER — FAMOTIDINE 20 MG PO TABS
20.0000 mg | ORAL_TABLET | Freq: Every day | ORAL | Status: DC
  Administered 2019-09-14: 22:00:00 20 mg via ORAL
  Filled 2019-09-14: qty 1

## 2019-09-14 MED ORDER — DEXTROSE 50 % IV SOLN
INTRAVENOUS | Status: AC
  Administered 2019-09-14: 50 mL
  Filled 2019-09-14: qty 50

## 2019-09-14 MED ORDER — PREDNISONE 50 MG PO TABS
60.0000 mg | ORAL_TABLET | Freq: Every day | ORAL | Status: DC
  Administered 2019-09-15: 60 mg via ORAL
  Filled 2019-09-14: qty 1

## 2019-09-14 MED ORDER — HYDRALAZINE HCL 25 MG PO TABS
25.0000 mg | ORAL_TABLET | Freq: Two times a day (BID) | ORAL | Status: DC
  Administered 2019-09-14: 22:00:00 25 mg via ORAL
  Filled 2019-09-14: qty 1

## 2019-09-14 MED ORDER — DEXTROSE IN LACTATED RINGERS 5 % IV SOLN: INTRAVENOUS | Status: DC

## 2019-09-14 MED ORDER — INSULIN DETEMIR 100 UNIT/ML ~~LOC~~ SOLN
20.0000 [IU] | Freq: Two times a day (BID) | SUBCUTANEOUS | Status: DC
  Administered 2019-09-14 – 2019-09-15 (×2): 20 [IU] via SUBCUTANEOUS
  Filled 2019-09-14 (×3): qty 0.2

## 2019-09-14 MED ORDER — BISOPROLOL FUMARATE 5 MG PO TABS
10.0000 mg | ORAL_TABLET | Freq: Every day | ORAL | Status: DC
  Administered 2019-09-15: 10:00:00 10 mg via ORAL
  Filled 2019-09-14 (×2): qty 2

## 2019-09-14 MED ORDER — INSULIN ASPART 100 UNIT/ML ~~LOC~~ SOLN
0.0000 [IU] | Freq: Three times a day (TID) | SUBCUTANEOUS | Status: DC
  Administered 2019-09-14: 22:00:00 7 [IU] via SUBCUTANEOUS
  Administered 2019-09-14: 18:00:00 3 [IU] via SUBCUTANEOUS
  Administered 2019-09-14: 2 [IU] via SUBCUTANEOUS
  Filled 2019-09-14 (×4): qty 1

## 2019-09-14 NOTE — Progress Notes (Signed)
Blood sugar 64. Notified B. Randol Kern NP. Received order for dextrose 5% LR.

## 2019-09-14 NOTE — Progress Notes (Signed)
Blood sugars did not transfer, 0300- 94, 0400-90.

## 2019-09-14 NOTE — Progress Notes (Addendum)
Patient's blood sugar 28, patient talking, able to take PO. 1 amp D50 given. Recheck 163. Notified on call provider B. Randol Kern NP. Levemir 50 units was not given at bedtime. Patient states he does not take this dose at home. Explained to provider as well. Will continue to monitor.

## 2019-09-14 NOTE — Progress Notes (Signed)
Patient with severe hypoglycemic event at 28.  Has had severeal hypoglycemic events over past several days.  HGB A1C 9.8.   Levemir decreased to 20 units  SSI ACHS changed to senstive scale Will need to continue to monitor and evaluate for optimal insulin dosing

## 2019-09-14 NOTE — Progress Notes (Addendum)
PROGRESS NOTE    Jimmy Olson  OVZ:[id number] DOB: 03/03/1961 DOA: 09/11/2019  PCP: System, Pcp Not In    LOS - 3   Brief Narrative:  59 y.o. Hispanicmalewith a known history of type IIdiabetes mellitus and hypertension, presented to the emergency room with acute onset of dyspnea with a feeling of chest closing up as well as wheezing. Upon presentation patient was hypertensive with 222/123, hypoxic satting 91% on room air, SARS-CoV-2 negative, influenza A/B negative, chest x-ray and CTA of the chest consistent with multifocal pneumonia, pulmonary edema.  Started on supplement oxygen, IV antibiotics, cultures were obtained.  Subjective 3/27: Patient seen at bedside this morning with his wife present.  Spanish interpreter was then used.  He had recent had another episode of chest pain that was relieved by sublingual nitro.  He reports his breathing feels better.  He asks again about having stress test.      Assessment & Plan:   Principal Problem:   Acute on chronic respiratory failure with hypoxia (HCC) Active Problems:   CAP (community acquired pneumonia)   Acute respiratory failure with hypercapnia ([place])   Accelerated hypertension   Type 2 diabetes mellitus with hyperlipidemia (HCC)   Type 2 diabetes mellitus with other specified complication (HCC)   AKI (acute kidney injury) ([place])   Acute respiratory failure with hypoxia due to  Multifocal community-acquired pneumonia COVID-19, influenza A/B were negative on admission.  Blood culture no growth to date.  Sputum cultures negative.  CTA chest was also obtained and ruled out PE but also showed multifocal pneumonia as did the chest x-ray. Remains on 2 L/min oxygen, currently satting in mid to upper 90s.   --Continue DuoNebs --Continue Rocephin and azithromycin --Transition steroids to p.o. prednisone 60 mg daily tomorrow, short taper --Continue antitussives/mucolytic's --Supplemental oxygen to maintain O2 sat greater than 90%,  wean down as tolerated  Hypoglycemia - overnight patient had CBG of 29.  Resolved with treatment. --Levemir decreased as below --continue to monitor CBG's --hypoglycemia protocol  Acute kidney injury vs CKD Stage IIIb  -present on admission with creatinine 1.83, essentially unchanged, could be patient's baseline.  Unknown if history of CKD, no baseline available.  With history of uncontrolled diabetes and uncontrolled hypertension, suspect he does have some element of CKD.   Suspect prerenal in the setting of dehydration due to acute illness, however patient did receive IV contrast for CT scan on admission.  --Will start gentle IV hydration --Monitor respiratory and volume status closely, given mildly reduced EF --Serial BMPs  New onset systolic CHF -echo showed EF of 45 to 50%, moderate global hypokinesis and significant LVH, grade 1 diastolic dysfunction. Elevated troponin -present on admission.  Cardiology attributes to demand ischemia in the setting of hypoxia, however patient gives history of chest pain episodes concerning for ischemia. --Cardiology following --Plan for Myoview stress test on Monday if patient still admitted versus outpatient --Continue beta-blocker --Home lisinopril, Metformin held due to AKI  Hypertensive urgency -present on admission.   Essential hypertension -home regimen appears to be amlodipine 10 mg, lisinopril 30 mg. --Continue home amlodipine --Hold lisinopril due to AKI --add PO hydralazine 25 mg PO BID while ACEI is held, increased dose of his BB --As needed IV hydralazine  Uncontrolled type 2 diabetes mellitus with hyperglycemia -uncontrolled.  A1c 9.8%. -Anticipating further hypERglycemia due to steroids --Monitor blood glucose -Hold Metformin --Continue home Levemir, decreased dose to 20 units twice daily given hypoglycemic episode last night --Sliding scale NovoLog --Titrate mealtime insulin  as needed for steroid-induced postprandial hyperglycemia   Hyperlipidemia --Continue statin   DVT prophylaxis: Lovenox   Code Status: Full Code  Family Communication: Wife at bedside during encounter  Disposition Plan: Expect discharge to prior home environment pending further clinical improvement and clearance by cardiology.  From pneumonia standpoint patient is stable for discharge, however continues to have episodes of significant chest pain. Coming From home Exp DC Date 3/30 Barriers clinical condition, ongoing eval Medically Stable for Discharge?  No  Consultants:   Cardiology  Procedures:   Echocardiogram EF 45 to 18%, grade 1 diastolic dysfunction, moderate LVH, moderate global LV hypokinesis  Antimicrobials:   Rocephin, azithromycin 3/25 >>   Objective: Vitals:   09/14/19 0922 09/14/19 1108 09/14/19 1122 09/14/19 1220  BP: (!) 164/95 (!) 174/96 (!) 163/95 (!) 156/90  Pulse: 88 91 88 85  Resp:      Temp:      TempSrc:      SpO2:      Weight:      Height:        Intake/Output Summary (Last 24 hours) at 09/14/2019 1538 Last data filed at 09/14/2019 1500 Gross per 24 hour  Intake 1261.13 ml  Output 1000 ml  Net 261.13 ml   Filed Weights   09/11/19 0127  Weight: 72.6 kg    Examination:  General exam: awake, alert, no acute distress HEENT: moist mucus membranes, hearing grossly normal  Respiratory system: Left base crackles otherwise clear bilaterally, no wheezes or rhonchi, normal respiratory effort.  On room air. Cardiovascular system: normal S1/S2, RRR, no pedal edema.   Central nervous system: A&O x4. no gross focal neurologic deficits, normal speech Extremities: moves all, no edema, normal tone Psychiatry: normal mood, congruent affect, judgement and insight appear normal    Data Reviewed: I have personally reviewed following labs and imaging studies  CBC: Recent Labs  Lab 09/11/19 0120 09/11/19 0417 09/12/19 0449 09/13/19 0521 09/14/19 0533  WBC 6.7 7.6 11.3* 12.5* 13.4*  NEUTROABS 4.1  --    --   --   --   HGB 11.3* 10.5* 10.6* 9.7* 9.9*  HCT 33.2* 31.8* 32.0* 29.3* 30.2*  MCV 86.2 88.6 87.7 88.0 88.8  PLT 269 274 292 278 841   Basic Metabolic Panel: Recent Labs  Lab 09/11/19 0120 09/11/19 0417 09/12/19 0449 09/13/19 0521 09/14/19 0533  NA 140 141 141 143 138  K 4.3 4.6 4.2 4.2 4.2  CL 109 109 111 113* 109  CO2 24 27 23 23  21*  GLUCOSE 313* 260* 229* 85 101*  BUN 34* 36* 45* 55* 60*  CREATININE 1.83* 1.82* 2.02* 2.04* 1.88*  CALCIUM 8.6* 8.5* 8.5* 8.5* 8.5*   GFR: Estimated Creatinine Clearance: 39.6 mL/min (A) (by C-G formula based on SCr of 1.88 mg/dL (H)). Liver Function Tests: Recent Labs  Lab 09/11/19 0120  AST 22  ALT 21  ALKPHOS 93  BILITOT 0.4  PROT 6.3*  ALBUMIN 2.8*   No results for input(s): LIPASE, AMYLASE in the last 168 hours. No results for input(s): AMMONIA in the last 168 hours. Coagulation Profile: No results for input(s): INR, PROTIME in the last 168 hours. Cardiac Enzymes: No results for input(s): CKTOTAL, CKMB, CKMBINDEX, TROPONINI in the last 168 hours. BNP (last 3 results) No results for input(s): PROBNP in the last 8760 hours. HbA1C: No results for input(s): HGBA1C in the last 72 hours. CBG: Recent Labs  Lab 09/14/19 0006 09/14/19 0017 09/14/19 0053 09/14/19 0223 09/14/19 1136  GLUCAP 31*  163* 121* 64* 188*   Lipid Profile: No results for input(s): CHOL, HDL, LDLCALC, TRIG, CHOLHDL, LDLDIRECT in the last 72 hours. Thyroid Function Tests: No results for input(s): TSH, T4TOTAL, FREET4, T3FREE, THYROIDAB in the last 72 hours. Anemia Panel: No results for input(s): VITAMINB12, FOLATE, FERRITIN, TIBC, IRON, RETICCTPCT in the last 72 hours. Sepsis Labs: Recent Labs  Lab 09/11/19 0120 09/12/19 0449 09/13/19 0521  PROCALCITON <0.10 0.11 <0.10    Recent Results (from the past 240 hour(s))  Respiratory Panel by RT PCR (Flu A&B, Covid) - Nasopharyngeal Swab     Status: None   Collection Time: 09/11/19  1:20 AM    Specimen: Nasopharyngeal Swab  Result Value Ref Range Status   SARS Coronavirus 2 by RT PCR NEGATIVE NEGATIVE Final    Comment: (NOTE) SARS-CoV-2 target nucleic acids are NOT DETECTED. The SARS-CoV-2 RNA is generally detectable in upper respiratoy specimens during the acute phase of infection. The lowest concentration of SARS-CoV-2 viral copies this assay can detect is 131 copies/mL. A negative result does not preclude SARS-Cov-2 infection and should not be used as the sole basis for treatment or other patient management decisions. A negative result may occur with  improper specimen collection/handling, submission of specimen other than nasopharyngeal swab, presence of viral mutation(s) within the areas targeted by this assay, and inadequate number of viral copies (<131 copies/mL). A negative result must be combined with clinical observations, patient history, and epidemiological information. The expected result is Negative. Fact Sheet for Patients:  PinkCheek.be Fact Sheet for Healthcare Providers:  GravelBags.it This test is not yet ap proved or cleared by the [place] FDA and  has been authorized for detection and/or diagnosis of SARS-CoV-2 by FDA under an Emergency Use Authorization (EUA). This EUA will remain  in effect (meaning this test can be used) for the duration of the COVID-19 declaration under Section 564(b)(1) of the Act, 21 U.S.C. section 360bbb-3(b)(1), unless the authorization is terminated or revoked sooner.    Influenza A by PCR NEGATIVE NEGATIVE Final   Influenza B by PCR NEGATIVE NEGATIVE Final    Comment: (NOTE) The Xpert Xpress SARS-CoV-2/FLU/RSV assay is intended as an aid in  the diagnosis of influenza from Nasopharyngeal swab specimens and  should not be used as a sole basis for treatment. Nasal washings and  aspirates are unacceptable for Xpert Xpress SARS-CoV-2/FLU/RSV  testing. Fact Sheet  for Patients: PinkCheek.be Fact Sheet for Healthcare Providers: GravelBags.it This test is not yet approved or cleared by the [place] FDA and  has been authorized for detection and/or diagnosis of SARS-CoV-2 by  FDA under an Emergency Use Authorization (EUA). This EUA will remain  in effect (meaning this test can be used) for the duration of the  Covid-19 declaration under Section 564(b)(1) of the Act, 21  U.S.C. section 360bbb-3(b)(1), unless the authorization is  terminated or revoked. Performed at [facility], [address] [place], [place] [address]   CULTURE, BLOOD (ROUTINE X 2) w Reflex to ID Panel     Status: None (Preliminary result)   Collection Time: 09/11/19  6:36 AM   Specimen: BLOOD  Result Value Ref Range Status   Specimen Description BLOOD RIGHT [facility]  Final   Special Requests   Final    BOTTLES DRAWN AEROBIC AND ANAEROBIC Blood Culture adequate volume   Culture   Final    NO GROWTH 3 DAYS Performed at [facility], [address], [place], [place] [address]    Report Status  PENDING  Incomplete  CULTURE, BLOOD (ROUTINE X 2) w Reflex to ID Panel     Status: None (Preliminary result)   Collection Time: 09/11/19  6:36 AM   Specimen: BLOOD  Result Value Ref Range Status   Specimen Description BLOOD RIGHT [facility]  Final   Special Requests   Final    BOTTLES DRAWN AEROBIC AND ANAEROBIC Blood Culture adequate volume   Culture   Final    NO GROWTH 3 DAYS Performed at [facility], [address] [place], [place] [address]    Report Status PENDING  Incomplete         Radiology Studies: No results found.      Scheduled Meds: . amLODipine  10 mg Oral Daily  . aspirin EC  81 mg Oral Daily  . azithromycin  500 mg Oral Daily  . bisoprolol  5 mg Oral Daily  . chlorpheniramine-HYDROcodone  5 mL Oral Q12H  . enoxaparin (LOVENOX) injection  40 mg Subcutaneous Q24H  .  guaiFENesin-dextromethorphan  10 mL Oral Q6H  . insulin aspart  0-9 Units Subcutaneous TID AC & HS  . insulin detemir  20 Units Subcutaneous BID  . isosorbide mononitrate  30 mg Oral Daily  . methylPREDNISolone (SOLU-MEDROL) injection  60 mg Intravenous Q12H  . rosuvastatin  10 mg Oral Daily   Continuous Infusions: . cefTRIAXone (ROCEPHIN)  IV Stopped (09/13/19 1628)  . dextrose 5% lactated ringers 75 mL/hr at 09/14/19 1500     LOS: 3 days    Time spent: 35 minutes    Ezekiel Slocumb, DO [facility]   If 7PM-7AM, please contact night-coverage [email or web address] 09/14/2019, 3:38 PM

## 2019-09-14 NOTE — Plan of Care (Signed)
  Problem: Activity: Goal: Ability to tolerate increased activity will improve Outcome: Progressing   Problem: Clinical Measurements: Goal: Ability to maintain a body temperature in the normal range will improve Outcome: Progressing   Problem: Respiratory: Goal: Ability to maintain adequate ventilation will improve Outcome: Progressing Goal: Ability to maintain a clear airway will improve Outcome: Progressing   Problem: Education: Goal: Knowledge of General Education information will improve Description: Including pain rating scale, medication(s)/side effects and non-pharmacologic comfort measures Outcome: Progressing   Problem: Clinical Measurements: Goal: Respiratory complications will improve Outcome: Progressing   Problem: Activity: Goal: Risk for activity intolerance will decrease Outcome: Progressing   Problem: Elimination: Goal: Will not experience complications related to bowel motility Outcome: Progressing Goal: Will not experience complications related to urinary retention Outcome: Progressing   Problem: Pain Managment: Goal: General experience of comfort will improve Outcome: Progressing

## 2019-09-14 NOTE — Progress Notes (Signed)
Blood sugar 103 at 0600. Data did not transfer.

## 2019-09-15 DIAGNOSIS — I152 Hypertension secondary to endocrine disorders: Secondary | ICD-10-CM

## 2019-09-15 DIAGNOSIS — I5020 Unspecified systolic (congestive) heart failure: Secondary | ICD-10-CM

## 2019-09-15 DIAGNOSIS — I10 Essential (primary) hypertension: Secondary | ICD-10-CM

## 2019-09-15 LAB — BASIC METABOLIC PANEL
Anion gap: 9 (ref 5–15)
BUN: 63 mg/dL — ABNORMAL HIGH (ref 6–20)
CO2: 23 mmol/L (ref 22–32)
Calcium: 8.3 mg/dL — ABNORMAL LOW (ref 8.9–10.3)
Chloride: 108 mmol/L (ref 98–111)
Creatinine, Ser: 1.84 mg/dL — ABNORMAL HIGH (ref 0.61–1.24)
GFR calc Af Amer: 46 mL/min — ABNORMAL LOW (ref >60)
GFR calc non Af Amer: 40 mL/min — ABNORMAL LOW (ref >60)
Glucose, Bld: 140 mg/dL — ABNORMAL HIGH (ref 70–99)
Potassium: 4.3 mmol/L (ref 3.5–5.1)
Sodium: 140 mmol/L (ref 135–145)

## 2019-09-15 LAB — GLUCOSE, CAPILLARY
Glucose-Capillary: 74 mg/dL (ref 70–99)
Glucose-Capillary: 91 mg/dL (ref 70–99)
Glucose-Capillary: 90 mg/dL (ref 70–99)
Glucose-Capillary: 103 mg/dL — ABNORMAL HIGH (ref 70–99)
Glucose-Capillary: 102 mg/dL — ABNORMAL HIGH (ref 70–99)
Glucose-Capillary: 134 mg/dL — ABNORMAL HIGH (ref 70–99)
Glucose-Capillary: 76 mg/dL (ref 70–99)
Glucose-Capillary: 93 mg/dL (ref 70–99)

## 2019-09-15 LAB — CBC
HCT: 29.6 % — ABNORMAL LOW (ref 39.0–52.0)
Hemoglobin: 9.7 g/dL — ABNORMAL LOW (ref 13.0–17.0)
MCH: 29 pg (ref 26.0–34.0)
MCHC: 32.8 g/dL (ref 30.0–36.0)
MCV: 88.6 fL (ref 80.0–100.0)
Platelets: 282 10*3/uL (ref 150–400)
RBC: 3.34 MIL/uL — ABNORMAL LOW (ref 4.22–5.81)
RDW: 12 % (ref 11.5–15.5)
WBC: 10 10*3/uL (ref 4.0–10.5)
nRBC: 0 % (ref 0.0–0.2)

## 2019-09-15 LAB — MAGNESIUM: Magnesium: 2.5 mg/dL — ABNORMAL HIGH (ref 1.7–2.4)

## 2019-09-15 MED ORDER — HYDRALAZINE HCL 50 MG PO TABS: 50.0000 mg | ORAL_TABLET | Freq: Three times a day (TID) | ORAL | 1 refills | Status: DC

## 2019-09-15 MED ORDER — INSULIN DETEMIR 100 UNIT/ML FLEXPEN: 20.0000 [IU] | PEN_INJECTOR | Freq: Two times a day (BID) | SUBCUTANEOUS | 1 refills | Status: DC

## 2019-09-15 MED ORDER — INSULIN LISPRO (1 UNIT DIAL) 100 UNIT/ML (KWIKPEN): 3.0000 [IU] | PEN_INJECTOR | Freq: Three times a day (TID) | SUBCUTANEOUS | 1 refills | Status: DC

## 2019-09-15 MED ORDER — NITROGLYCERIN 0.4 MG SL SUBL: 0.4000 mg | SUBLINGUAL_TABLET | SUBLINGUAL | 1 refills | Status: DC | PRN

## 2019-09-15 MED ORDER — BISOPROLOL FUMARATE 10 MG PO TABS: 10.0000 mg | ORAL_TABLET | Freq: Every day | ORAL | 1 refills | Status: DC

## 2019-09-15 MED ORDER — GUAIFENESIN-DM 100-10 MG/5ML PO SYRP: 10.0000 mL | ORAL_SOLUTION | Freq: Four times a day (QID) | ORAL | 0 refills | Status: DC

## 2019-09-15 MED ORDER — HYDRALAZINE HCL 25 MG PO TABS
25.0000 mg | ORAL_TABLET | Freq: Three times a day (TID) | ORAL | Status: DC
  Filled 2019-09-15: qty 1

## 2019-09-15 MED ORDER — INSULIN DETEMIR 100 UNIT/ML FLEXPEN: 20.0000 [IU] | PEN_INJECTOR | Freq: Two times a day (BID) | SUBCUTANEOUS | 2 refills | Status: DC

## 2019-09-15 MED ORDER — INSULIN ASPART 100 UNIT/ML ~~LOC~~ SOLN: 0.0000 [IU] | Freq: Three times a day (TID) | SUBCUTANEOUS | Status: DC

## 2019-09-15 MED ORDER — HYDRALAZINE HCL 50 MG PO TABS
50.0000 mg | ORAL_TABLET | Freq: Three times a day (TID) | ORAL | Status: DC
  Administered 2019-09-15: 50 mg via ORAL
  Filled 2019-09-15: qty 1

## 2019-09-15 MED ORDER — ISOSORBIDE MONONITRATE ER 30 MG PO TB24: 30.0000 mg | ORAL_TABLET | Freq: Every day | ORAL | 1 refills | Status: DC

## 2019-09-15 MED ORDER — INSULIN ASPART 100 UNIT/ML ~~LOC~~ SOLN: 3.0000 [IU] | Freq: Three times a day (TID) | SUBCUTANEOUS | Status: DC

## 2019-09-15 MED ORDER — CEFDINIR 300 MG PO CAPS: 300.0000 mg | ORAL_CAPSULE | Freq: Two times a day (BID) | ORAL | 0 refills | Status: AC

## 2019-09-15 NOTE — Discharge Summary (Signed)
Physician Discharge Summary  Jimmy Olson XNA:[id number] DOB: 1961/06/13 DOA: 09/11/2019  PCP: System, Pcp Not In  Admit date: 09/11/2019 Discharge date: 10/01/2019  Admitted From: home Disposition:  home  Recommendations for Outpatient Follow-up:  1. Follow up with PCP in 1-2 weeks 2. Please obtain BMP/CBC in one week 3. Please follow up with cardiology in 1-2 weeks regarding heart condition and scheduling stress test 4. Complete 3 more days antibiotics for pneumonia 5. Please follow up on patient's insulin regimen.  Metformin was discontinued because of renal failure.  Insulin dosing was reduced significantly due to severe hypoglycemic episodes.  Home Health: No  Equipment/Devices: None   Discharge Condition: Stable  CODE STATUS: DNR  Diet recommendation: Heart Healthy / Carb Modified   Brief/Interim Summary:  59 y.o. Hispanicmalewith a known history of type IIdiabetes mellitus and hypertension, presented to the emergency room with acute onset of dyspnea with a feeling of chest closing up as well as wheezing. Upon presentation patient was hypertensive with 222/123, hypoxic satting 91% on room air, SARS-CoV-2 negative, influenza A/B negative, chest x-ray and CTA of the chest consistent with multifocal pneumonia, pulmonary edema.  Started on supplement oxygen, IV antibiotics, cultures were obtained.  Acute respiratory failure with hypoxia due to  Multifocal community-acquired pneumonia COVID-19, influenza A/B were negative on admission.  Blood and sputum cultures negative.  CTA chest was also obtained and ruled out PE but also showed multifocal pneumonia as did the chest x-ray.  Required 2 L/min oxygen and weaned off. Treated with Rocephin and Azithromycin, brochodilators, steroids, antitussives and mucolytics.  Discharged with short taper prednisone and Omnicef to complete course of therapy.  Hypoglycemia - patient had overnight  CBG of 29.  Resolved with treatment per protocol.   Levemir decreased as below.  Acute kidney injury vs CKD Stage IIIb  -present on admission with creatinine 1.83, essentially unchanged, could be patient's baseline.  Unknown if history of CKD, no baseline available.  With history of uncontrolled diabetes and uncontrolled hypertension, suspect he does have some element of CKD.   Suspect prerenal in the setting of dehydration due to acute illness, however patient did receive IV contrast for CT scan on admission. Gentle IV hydration initially. BMP to monitor.  New onset systolic CHF -echo showed EF of 45 to 50%, moderate global hypokinesis and significant LVH, grade 1 diastolic dysfunction. Elevated troponin -present on admission.  Cardiology attributes to demand ischemia in the setting of hypoxia, however patient gives history of chest pain episodes concerning for ischemia. --Cardiology following.  Myoview stress test to be done outpatient once recovered from pneumonia.  Lisinopril and metformin held due to AKI.  Continue beta blocker.  Hypertensive urgency -present on admission.   Essential hypertension -home regimen appears to be amlodipine 10 mg, lisinopril 30 mg. --Continue home amlodipine --Hold lisinopril due to AKI --add PO hydralazine 25 mg PO BID while ACEI is held, increased dose of his BB --As needed IV hydralazine  Uncontrolled type 2 diabetes mellitus with hyperglycemia -uncontrolled.  A1c 9.8%. -Anticipating further hypERglycemia due to steroids --Monitor blood glucose -Hold Metformin --Continue home Levemir, titrated due to hypoglycemia --Sliding scale NovoLog --Titrate mealtime insulin as needed for steroid-induced postprandial hyperglycemia  Hyperlipidemia --Continue statin  Discharge Diagnoses: Principal Problem:   Acute on chronic respiratory failure with hypoxia (HCC) Active Problems:   CAP (community acquired pneumonia)   Acute respiratory failure with hypercapnia ([place])   Accelerated hypertension   Type 2  diabetes mellitus with hyperlipidemia ([place])  Type 2 diabetes mellitus with other specified complication (HCC)   AKI (acute kidney injury) ([place])   HFrEF (heart failure with reduced ejection fraction) ([place])   Essential hypertension    Discharge Instructions   Discharge Instructions    (HEART FAILURE PATIENTS) Call MD:  Anytime you have any of the following symptoms: 1) 3 pound weight gain in 24 hours or 5 pounds in 1 week 2) shortness of breath, with or without a dry hacking cough 3) swelling in the hands, feet or stomach 4) if you have to sleep on extra pillows at night in order to breathe.   Complete by: As directed    Call MD for:   Complete by: As directed    Chest pain not relieved by nitroglycerin.   Call MD for:  extreme fatigue   Complete by: As directed    Call MD for:  persistant dizziness or light-headedness   Complete by: As directed    Call MD for:  persistant nausea and vomiting   Complete by: As directed    Diet - low sodium heart healthy   Complete by: As directed    Diet Carb Modified   Complete by: As directed    Discharge instructions   Complete by: As directed    Take antibiotic for 3 more days for Pneumonia.  Follow up with cardiology in 1-2 weeks regarding your heart, blood pressure.   Cardiology will arrange the stress test to look at your heart.  Monitor your blood sugar and call your primary care doctor if your blood sugar is not controlled (if having high sugars or very low sugars).   Increase activity slowly   Complete by: As directed      Allergies as of 09/15/2019   Not on File     Medication List    STOP taking these medications   metFORMIN 1000 MG tablet Commonly known as: GLUCOPHAGE     TAKE these medications   amLODipine 10 MG tablet Commonly known as: NORVASC Take 10 mg by mouth daily.   aspirin EC 81 MG tablet Take 81 mg by mouth daily.   bisoprolol 10 MG tablet Commonly known as: ZEBETA Take 1 tablet (10 mg total) by mouth  daily.   guaiFENesin-dextromethorphan 100-10 MG/5ML syrup Commonly known as: ROBITUSSIN DM Take 10 mLs by mouth every 6 (six) hours.   hydrALAZINE 50 MG tablet Commonly known as: APRESOLINE Take 1 tablet (50 mg total) by mouth 3 (three) times daily.   insulin detemir 100 UNIT/ML FlexPen Commonly known as: LEVEMIR Inject 20 Units into the skin 2 (two) times daily. What changed:   how much to take  additional instructions   insulin detemir 100 UNIT/ML FlexPen Commonly known as: LEVEMIR Inject 20 Units into the skin 2 (two) times daily. What changed: You were already taking a medication with the same name, and this prescription was added. Make sure you understand how and when to take each.   insulin lispro 100 UNIT/ML KwikPen Commonly known as: HUMALOG Inject 0.03 mLs (3 Units total) into the skin 3 (three) times daily with meals. Do not take if you do not eat more than 50% of meal   isosorbide mononitrate 30 MG 24 hr tablet Commonly known as: IMDUR Take 1 tablet (30 mg total) by mouth daily.   lisinopril 30 MG tablet Commonly known as: ZESTRIL Take 30 mg by mouth daily.   nitroGLYCERIN 0.4 MG SL tablet Commonly known as: NITROSTAT Place 1 tablet (0.4 mg total)  under the tongue every 5 (five) minutes as needed for chest pain.   rosuvastatin 10 MG tablet Commonly known as: CRESTOR Take 10 mg by mouth daily.     ASK your doctor about these medications   cefdinir 300 MG capsule Commonly known as: OMNICEF Take 1 capsule (300 mg total) by mouth 2 (two) times daily for 3 days. Ask about: Should I take this medication?      Follow-up [facility], [facility] up.   Specialty: General Practice Contact information: [address] [name] [address] [phone]        [facility] Follow up on 09/24/2019.   Specialty: Cardiology Why: at 12:30pm. Enter through the [facility]  entrance Contact information: [place] [phone]         No Known Allergies  Consultations:  Cardiology    Procedures/Studies: DG Chest 2 View  Result Date: 09/29/2019 CLINICAL DATA:  Chest pain, shortness of breath. EXAM: CHEST - 2 VIEW COMPARISON:  September 11, 2019. FINDINGS: The heart size and mediastinal contours are within normal limits. Both lungs are clear. No pneumothorax or pleural effusion is noted. The visualized skeletal structures are unremarkable. IMPRESSION: No active cardiopulmonary disease. Electronically Signed   By: Marijo Conception M.D.   On: 09/29/2019 10:41   DG Chest 2 View  Result Date: 09/11/2019 CLINICAL DATA:  Aspiration evaluation EXAM: CHEST - 2 VIEW COMPARISON:  07/28/2009 FINDINGS: Patchy bilateral airspace disease. Heart is normal size. No effusions or pneumothorax. No acute bony abnormality. IMPRESSION: Patchy bilateral airspace disease concerning for pneumonia. Electronically Signed   By: Rolm Baptise M.D.   On: 09/11/2019 01:44   CT Angio Chest PE W and/or Wo Contrast  Result Date: 09/11/2019 CLINICAL DATA:  Shortness of breath EXAM: CT ANGIOGRAPHY CHEST WITH CONTRAST TECHNIQUE: Multidetector CT imaging of the chest was performed using the standard protocol during bolus administration of intravenous contrast. Multiplanar CT image reconstructions and MIPs were obtained to evaluate the vascular anatomy. CONTRAST:  18mL OMNIPAQUE IOHEXOL 350 MG/ML SOLN COMPARISON:  Radiograph same day FINDINGS: Cardiovascular: There is a optimal opacification of the pulmonary arteries. There is no central,segmental, or subsegmental filling defects within the pulmonary arteries. The heart is normal in size. No pericardial effusion or thickening. No evidence right heart strain. There is normal three-vessel brachiocephalic anatomy without proximal stenosis. The thoracic aorta is normal in appearance. Mediastinum/Nodes: No  hilar, mediastinal, or axillary adenopathy. Thyroid gland, trachea, and esophagus demonstrate no significant findings. Lungs/Pleura: There is multifocal fluffy/patchy predominantly centrally based airspace opacities seen throughout both lungs. Interlobular septal thickening is seen at both lung bases. There is a trace right pleural effusion present Upper Abdomen: No acute abnormalities present in the visualized portions of the upper abdomen. Musculoskeletal: No chest wall abnormality. No acute or significant osseous findings. Review of the MIP images confirms the above findings. IMPRESSION: No central, segmental, or subsegmental pulmonary embolism. Multifocal bilateral airspace opacities , likely due to pulmonary edema or infectious etiology. Trace right pleural effusion. Electronically Signed   By: Prudencio Pair M.D.   On: 09/11/2019 05:35   DG Finger Middle Left  Result Date: 09/21/2019 CLINICAL DATA:  Crush injury, laceration EXAM: LEFT MIDDLE FINGER 2+V COMPARISON:  None. FINDINGS: There is a soft tissue and bone defect at the tip of the left middle finger. No additional fracture, subluxation or dislocation. IMPRESSION: Soft tissue  and bone defect at the tip of the left middle finger distal phalanx. Electronically Signed   By: Rolm Baptise M.D.   On: 09/21/2019 16:46   ECHOCARDIOGRAM COMPLETE  Result Date: 09/11/2019    ECHOCARDIOGRAM REPORT   Patient Name:   MANSFIELD DANN Date of Exam: 09/11/2019 Medical Rec #:  [id number]     Height:       66.0 in Accession #:    [id number]    Weight:       160.0 lb Date of Birth:  Aug 27, 1960     BSA:          1.819 m Patient Age:    59 years      BP:           162/86 mmHg Patient Gender: M             HR:           87 bpm. Exam Location:  [facility] Procedure: 2D Echo, Color Doppler and Cardiac Doppler Indications:     Dyspnea 786.09  History:         Patient has no prior history of Echocardiogram examinations.                  Risk Factors:Hypertension and Diabetes.   Sonographer:     Sherrie Sport RDCS (AE) Referring Phys:  [id number] Arvella Merles MANSY Diagnosing Phys: Ida Rogue MD IMPRESSIONS  1. Left ventricular ejection fraction, by estimation, is 45 to 50%. The left ventricle has mildly decreased function. The left ventricle demonstrates global hypokinesis. There is moderate left ventricular hypertrophy. Left ventricular diastolic parameters are consistent with Grade I diastolic dysfunction (impaired relaxation).  2. Right ventricular systolic function is normal. The right ventricular size is normal. There is normal pulmonary artery systolic pressure. FINDINGS  Left Ventricle: Left ventricular ejection fraction, by estimation, is 45 to 50%. The left ventricle has mildly decreased function. The left ventricle demonstrates global hypokinesis. The left ventricular internal cavity size was normal in size. There is  moderate left ventricular hypertrophy. Left ventricular diastolic parameters are consistent with Grade I diastolic dysfunction (impaired relaxation). Right Ventricle: The right ventricular size is normal. No increase in right ventricular wall thickness. Right ventricular systolic function is normal. There is normal pulmonary artery systolic pressure. The tricuspid regurgitant velocity is 2.05 m/s, and  with an assumed right atrial pressure of 10 mmHg, the estimated right ventricular systolic pressure is 30.8 mmHg. Left Atrium: Left atrial size was normal in size. Right Atrium: Right atrial size was normal in size. Pericardium: There is no evidence of pericardial effusion. Mitral Valve: The mitral valve is normal in structure. Normal mobility of the mitral valve leaflets. Mild mitral valve regurgitation. No evidence of mitral valve stenosis. Tricuspid Valve: The tricuspid valve is normal in structure. Tricuspid valve regurgitation is trivial. No evidence of tricuspid stenosis. Aortic Valve: The aortic valve was not well visualized. Aortic valve regurgitation is not  visualized. No aortic stenosis is present. Aortic valve mean gradient measures 2.0 mmHg. Aortic valve peak gradient measures 3.7 mmHg. Aortic valve area, by VTI measures 2.73 cm. Pulmonic Valve: The pulmonic valve was normal in structure. Pulmonic valve regurgitation is not visualized. No evidence of pulmonic stenosis. Aorta: The aortic root is normal in size and structure. Venous: The inferior vena cava is normal in size with greater than 50% respiratory variability, suggesting right atrial pressure of 3 mmHg. IAS/Shunts: No atrial level shunt detected by color flow Doppler.  LEFT  VENTRICLE PLAX 2D LVIDd:         5.01 cm      Diastology LVIDs:         3.88 cm      LV e' lateral:   4.57 cm/s LV PW:         1.32 cm      LV E/e' lateral: 15.6 LV IVS:        0.86 cm      LV e' medial:    5.00 cm/s LVOT diam:     2.10 cm      LV E/e' medial:  14.3 LV SV:         50 LV SV Index:   28 LVOT Area:     3.46 cm  LV Volumes (MOD) LV vol d, MOD A2C: 126.0 ml LV vol d, MOD A4C: 117.0 ml LV vol s, MOD A2C: 113.0 ml LV vol s, MOD A4C: 73.2 ml LV SV MOD A2C:     13.0 ml LV SV MOD A4C:     117.0 ml LV SV MOD BP:      33.2 ml RIGHT VENTRICLE RV Basal diam:  4.13 cm RV S prime:     9.79 cm/s LEFT ATRIUM             Index       RIGHT ATRIUM           Index LA diam:        3.40 cm 1.87 cm/m  RA Area:     16.10 cm LA Vol (A2C):   59.8 ml 32.88 ml/m RA Volume:   44.60 ml  24.52 ml/m LA Vol (A4C):   58.4 ml 32.11 ml/m LA Biplane Vol: 64.5 ml 35.46 ml/m  AORTIC VALVE                   PULMONIC VALVE AV Area (Vmax):    2.29 cm    PV Vmax:        0.73 m/s AV Area (Vmean):   2.26 cm    PV Peak grad:   2.1 mmHg AV Area (VTI):     2.73 cm    RVOT Peak grad: 2 mmHg AV Vmax:           96.10 cm/s AV Vmean:          69.600 cm/s AV VTI:            0.184 m AV Peak Grad:      3.7 mmHg AV Mean Grad:      2.0 mmHg LVOT Vmax:         63.50 cm/s LVOT Vmean:        45.400 cm/s LVOT VTI:          0.145 m LVOT/AV VTI ratio: 0.79  AORTA Ao Root  diam: 2.50 cm MITRAL VALVE               TRICUSPID VALVE MV Area (PHT): 4.89 cm    TR Peak grad:   16.8 mmHg MV Decel Time: 155 msec    TR Vmax:        205.00 cm/s MV E velocity: 71.30 cm/s MV A velocity: 94.10 cm/s  SHUNTS MV E/A ratio:  0.76        Systemic VTI:  0.14 m                            Systemic Diam:  2.10 cm Ida Rogue MD Electronically signed by Ida Rogue MD Signature Date/Time: 09/11/2019/6:35:09 PM    Final       Echocardiogram EF 45 to 50%, grade 1 diastolic dysfunction, moderate LVH, moderate global LV hypokinesis   Subjective: Patient reports feeling better.  No CP or SOB, F/C or other complaints.  We discussed getting outpatient stress test because of pneumonia and he agreed.     Discharge Exam: Vitals:   09/14/19 2311 09/15/19 0741  BP: (!) 156/87 (!) 166/93  Pulse: 89 82  Resp: 17 18  Temp: 98.3 F (36.8 C) 97.9 F (36.6 C)  SpO2: 96% 97%   Vitals:   09/14/19 1549 09/14/19 2207 09/14/19 2311 09/15/19 0741  BP: (!) 158/93 (!) 164/87 (!) 156/87 (!) 166/93  Pulse: 90 89 89 82  Resp: 18 16 17 18   Temp: 98.7 F (37.1 C) 98.7 F (37.1 C) 98.3 F (36.8 C) 97.9 F (36.6 C)  TempSrc:  Oral  Oral  SpO2: 97% 99% 96% 97%  Weight:      Height:        General: Pt is alert, awake, not in acute distress Cardiovascular: RRR, S1/S2 +, no rubs, no gallops Respiratory: CTA bilaterally diminished at bases, no wheezing, no rhonchi Abdominal: Soft, NT, ND, bowel sounds + Extremities: no edema, no cyanosis    The results of significant diagnostics from this hospitalization (including imaging, microbiology, ancillary and laboratory) are listed below for reference.     Microbiology: No results found for this or any previous visit (from the past 240 hour(s)).   Labs: BNP (last 3 results) Recent Labs    09/12/19 0449 09/13/19 0521 09/14/19 0533  BNP 744.0* 477.0* 932.6*   Basic Metabolic Panel: Recent Labs  Lab 09/29/19 1025  NA 136  K 4.5  CL 104   CO2 25  GLUCOSE 234*  BUN 27*  CREATININE 1.99*  CALCIUM 9.0   Liver Function Tests: No results for input(s): AST, ALT, ALKPHOS, BILITOT, PROT, ALBUMIN in the last 168 hours. No results for input(s): LIPASE, AMYLASE in the last 168 hours. No results for input(s): AMMONIA in the last 168 hours. CBC: Recent Labs  Lab 09/29/19 1025  WBC 6.0  HGB 11.6*  HCT 33.9*  MCV 84.5  PLT 278   Cardiac Enzymes: No results for input(s): CKTOTAL, CKMB, CKMBINDEX, TROPONINI in the last 168 hours. BNP: Invalid input(s): POCBNP CBG: No results for input(s): GLUCAP in the last 168 hours. D-Dimer No results for input(s): DDIMER in the last 72 hours. Hgb A1c No results for input(s): HGBA1C in the last 72 hours. Lipid Profile No results for input(s): CHOL, HDL, LDLCALC, TRIG, CHOLHDL, LDLDIRECT in the last 72 hours. Thyroid function studies No results for input(s): TSH, T4TOTAL, T3FREE, THYROIDAB in the last 72 hours.  Invalid input(s): FREET3 Anemia work up No results for input(s): VITAMINB12, FOLATE, FERRITIN, TIBC, IRON, RETICCTPCT in the last 72 hours. Urinalysis No results found for: COLORURINE, APPEARANCEUR, LABSPEC, [place], GLUCOSEU, HGBUR, BILIRUBINUR, KETONESUR, PROTEINUR, UROBILINOGEN, NITRITE, LEUKOCYTESUR Sepsis Labs Invalid input(s): PROCALCITONIN,  WBC,  LACTICIDVEN Microbiology No results found for this or any previous visit (from the past 240 hour(s)).   Time coordinating discharge: Over 30 minutes  SIGNED:   Ezekiel Slocumb, DO [facility] 10/01/2019, 4:18 PM   If 7PM-7AM, please contact night-coverage [email or web address]

## 2019-09-15 NOTE — Progress Notes (Signed)
Inpatient Diabetes Program Recommendations  AACE/ADA: New Consensus Statement on Inpatient Glycemic Control (2015)  Target Ranges:  Prepandial:   less than 140 mg/dL      Peak postprandial:   less than 180 mg/dL (1-2 hours)      Critically ill patients:  140 - 180 mg/dL   Lab Results  Component Value Date   GLUCAP 76 09/15/2019   HGBA1C 9.8 (H) 09/11/2019    Review of Glycemic Control Results for KILO, ESHELMAN (MRN [id number]) as of 09/15/2019 10:04  Ref. Range 09/14/2019 16:49 09/14/2019 21:07 09/14/2019 23:35 09/15/2019 04:09 09/15/2019 08:09  Glucose-Capillary Latest Ref Range: 70 - 99 mg/dL 284 (H) 318 (H) 294 (H) 134 (H) 76   Diabetes history: DM 2 Outpatient Diabetes medications:  Current orders for Inpatient glycemic control:  Novolog sensitive tid with meals and HS (0-9 units) Levemir 20 units bid Prednisone 60 mg with breakfast Inpatient Diabetes Program Recommendations:   Please consider changing HS scale to bedtime scale that is less aggressive.  Also add Novolog meal coverage 3 units tid with meals (hold if patient eats less than 50%).   Thanks,  Adah Perl, RN, BC-ADM Inpatient Diabetes Coordinator Pager [phone] (8a-5p)

## 2019-09-15 NOTE — TOC Transition Note (Signed)
Transition of Care [facility]) - CM/SW Discharge Note   Patient Details  Name: Jimmy Olson MRN: [id number] Date of Birth: 03/29/1961  [facility] CM/SW Contact:  Elease Hashimoto, LCSW Phone Number: 09/15/2019, 1:19 PM   Clinical Narrative:   Met with pt to discuss medications. He is already set up with the [facility] for PCP. Have asked MD to electronically send over his medications to the [facility] and will see if someone can bring them over or if he will need to pick up on way home. He has no other needs. His wife drives and can transport to appointments. No equipment needs. Otherwise pt can pick up meds from [facility] prior to going home    Final next level of care: Home/Self Care Barriers to Discharge: Barriers Resolved   Patient Goals and CMS Choice Patient states their goals for this hospitalization and ongoing recovery are:: I hope to feel better      Discharge Placement                Patient to be transferred to facility by: Wife via car Name of family member notified: wife Patient and family notified of of transfer: 09/15/19  Discharge Plan and Services In-house Referral: Clinical Social Work Discharge Planning Services: Medication Assistance, [facility]                                 Social Determinants of Health (SDOH) Interventions     Readmission Risk Interventions No flowsheet data found.

## 2019-09-15 NOTE — Plan of Care (Signed)
Patient discharged home per MD order. All discharge instructions given using spanish interpreter and all questions answered.

## 2019-09-15 NOTE — Progress Notes (Signed)
Progress Note  Patient Name: Jimmy Olson Date of Encounter: 09/15/2019  Primary Cardiologist: Ida Rogue, MD   Subjective   Doing ok, denies shortness of breath, fevers. No new events  Inpatient Medications    Scheduled Meds: . amLODipine  10 mg Oral Daily  . aspirin EC  81 mg Oral Daily  . bisoprolol  10 mg Oral Daily  . chlorpheniramine-HYDROcodone  5 mL Oral Q12H  . enoxaparin (LOVENOX) injection  40 mg Subcutaneous Q24H  . famotidine  20 mg Oral QHS  . guaiFENesin-dextromethorphan  10 mL Oral Q6H  . hydrALAZINE  50 mg Oral TID  . insulin aspart  0-9 Units Subcutaneous TID AC & HS  . insulin detemir  20 Units Subcutaneous BID  . isosorbide mononitrate  30 mg Oral Daily  . predniSONE  60 mg Oral Q breakfast  . rosuvastatin  10 mg Oral Daily   Continuous Infusions: . cefTRIAXone (ROCEPHIN)  IV Stopped (09/14/19 1900)   PRN Meds: acetaminophen **OR** acetaminophen, hydrALAZINE, ipratropium-albuterol, labetalol, magnesium hydroxide, nitroGLYCERIN, ondansetron **OR** ondansetron (ZOFRAN) IV, traZODone   Vital Signs    Vitals:   09/14/19 1549 09/14/19 2207 09/14/19 2311 09/15/19 0741  BP: (!) 158/93 (!) 164/87 (!) 156/87 (!) 166/93  Pulse: 90 89 89 82  Resp: 18 16 17 18   Temp: 98.7 F (37.1 C) 98.7 F (37.1 C) 98.3 F (36.8 C) 97.9 F (36.6 C)  TempSrc:  Oral  Oral  SpO2: 97% 99% 96% 97%  Weight:      Height:        Intake/Output Summary (Last 24 hours) at 09/15/2019 0949 Last data filed at 09/14/2019 1841 Gross per 24 hour  Intake 990.22 ml  Output --  Net 990.22 ml   Last 3 Weights 09/11/2019  Weight (lbs) 160 lb  Weight (kg) 72.576 kg      Telemetry    sinus - Personally Reviewed   Physical Exam   GEN: No acute distress.   Neck: No JVD Cardiac: RRR, no murmurs, rubs, or gallops.  Respiratory: Clear to auscultation bilaterally. GI: Soft, nontender, non-distended  MS: No edema; No deformity. Neuro:  Nonfocal  Psych: Normal affect    Labs    High Sensitivity Troponin:   Recent Labs  Lab 09/11/19 0120 09/11/19 0417 09/11/19 0802 09/11/19 0949  TROPONINIHS 82* 163* 199* 189*      Chemistry Recent Labs  Lab 09/11/19 0120 09/11/19 0417 09/13/19 0521 09/14/19 0533 09/15/19 0405  NA 140   < > 143 138 140  K 4.3   < > 4.2 4.2 4.3  CL 109   < > 113* 109 108  CO2 24   < > 23 21* 23  GLUCOSE 313*   < > 85 101* 140*  BUN 34*   < > 55* 60* 63*  CREATININE 1.83*   < > 2.04* 1.88* 1.84*  CALCIUM 8.6*   < > 8.5* 8.5* 8.3*  PROT 6.3*  --   --   --   --   ALBUMIN 2.8*  --   --   --   --   AST 22  --   --   --   --   ALT 21  --   --   --   --   ALKPHOS 93  --   --   --   --   BILITOT 0.4  --   --   --   --   GFRNONAA 40*   < >  35* 39* 40*  GFRAA 47*   < > 41* 45* 46*  ANIONGAP 7   < > 7 8 9    < > = values in this interval not displayed.     Hematology Recent Labs  Lab 09/13/19 0521 09/14/19 0533 09/15/19 0405  WBC 12.5* 13.4* 10.0  RBC 3.33* 3.40* 3.34*  HGB 9.7* 9.9* 9.7*  HCT 29.3* 30.2* 29.6*  MCV 88.0 88.8 88.6  MCH 29.1 29.1 29.0  MCHC 33.1 32.8 32.8  RDW 12.2 12.2 12.0  PLT 278 292 282    BNP Recent Labs  Lab 09/12/19 0449 09/13/19 0521 09/14/19 0533  BNP 744.0* 477.0* 499.0*     DDimer No results for input(s): DDIMER in the last 168 hours.   Radiology    No results found.  Cardiac Studies   TTE 09/11/2019 1. Left ventricular ejection fraction, by estimation, is 45 to 50%. The  left ventricle has mildly decreased function. The left ventricle  demonstrates global hypokinesis. There is moderate left ventricular  hypertrophy. Left ventricular diastolic  parameters are consistent with Grade I diastolic dysfunction (impaired  relaxation).  2. Right ventricular systolic function is normal. The right ventricular  size is normal. There is normal pulmonary artery systolic pressure.   Patient Profile     59 y.o. malewith a history of DMII, HTN, and prior normal stress test, who  is being seen today for the evaluation ofdyspnea, PNA, hypertensive urgency, LV dysfunction, and HsTroponin elevation  Assessment & Plan    1. Mildly reduced EF -Likely due to longstanding hypertension. -on bisoprolol, imdur -increase hydralazine to 25mg  tid -plan for myoview as outpatient -AKI presents addition of ACE/ARB at this point -Patient can be discharged on current medications from a cardiac perspective.  2.  Hypertension -Blood pressure slightly elevated. -Increase hydralazine to 25 mg 3 times daily -Continue bisoprolol, Imdur  3.  Pneumonia -Management as per primary team    Signed, Kate Sable, MD  09/15/2019, 9:49 AM

## 2019-09-15 NOTE — Plan of Care (Signed)
  Problem: Activity: Goal: Ability to tolerate increased activity will improve Outcome: Progressing   Problem: Clinical Measurements: Goal: Ability to maintain a body temperature in the normal range will improve Outcome: Progressing   Problem: Respiratory: Goal: Ability to maintain adequate ventilation will improve Outcome: Progressing Goal: Ability to maintain a clear airway will improve Outcome: Progressing   Problem: Education: Goal: Knowledge of General Education information will improve Description: Including pain rating scale, medication(s)/side effects and non-pharmacologic comfort measures Outcome: Progressing   Problem: Clinical Measurements: Goal: Respiratory complications will improve Outcome: Progressing   Problem: Activity: Goal: Risk for activity intolerance will decrease Outcome: Progressing   Problem: Elimination: Goal: Will not experience complications related to bowel motility Outcome: Progressing Goal: Will not experience complications related to urinary retention Outcome: Progressing   Problem: Pain Managment: Goal: General experience of comfort will improve Outcome: Progressing

## 2019-09-16 LAB — CULTURE, BLOOD (ROUTINE X 2)
Culture: NO GROWTH
Culture: NO GROWTH
Special Requests: ADEQUATE
Special Requests: ADEQUATE

## 2019-09-17 ENCOUNTER — Telehealth: Payer: Self-pay | Admitting: General Practice

## 2019-09-17 NOTE — Telephone Encounter (Signed)
We received a referral for Jimmy Olson so I called to follow up and see if he had any questions. I was able to speak with him with the use of an interpreter and answer any questions he had.

## 2019-09-21 ENCOUNTER — Encounter: Payer: Self-pay | Admitting: Emergency Medicine

## 2019-09-21 ENCOUNTER — Emergency Department: Payer: Self-pay

## 2019-09-21 ENCOUNTER — Emergency Department
Admission: EM | Admit: 2019-09-21 | Discharge: 2019-09-21 | Disposition: A | Discharge status: Home / Self Care | Payer: Self-pay | Attending: Emergency Medicine | Admitting: Emergency Medicine

## 2019-09-21 ENCOUNTER — Other Ambulatory Visit: Payer: Self-pay

## 2019-09-21 DIAGNOSIS — E119 Type 2 diabetes mellitus without complications: Secondary | ICD-10-CM | POA: Insufficient documentation

## 2019-09-21 DIAGNOSIS — Z79899 Other long term (current) drug therapy: Secondary | ICD-10-CM | POA: Insufficient documentation

## 2019-09-21 DIAGNOSIS — I110 Hypertensive heart disease with heart failure: Secondary | ICD-10-CM | POA: Insufficient documentation

## 2019-09-21 DIAGNOSIS — S61213A Laceration without foreign body of left middle finger without damage to nail, initial encounter: Secondary | ICD-10-CM

## 2019-09-21 DIAGNOSIS — Z23 Encounter for immunization: Secondary | ICD-10-CM | POA: Insufficient documentation

## 2019-09-21 DIAGNOSIS — Y929 Unspecified place or not applicable: Secondary | ICD-10-CM | POA: Insufficient documentation

## 2019-09-21 DIAGNOSIS — W231XXA Caught, crushed, jammed, or pinched between stationary objects, initial encounter: Secondary | ICD-10-CM | POA: Insufficient documentation

## 2019-09-21 DIAGNOSIS — I509 Heart failure, unspecified: Secondary | ICD-10-CM | POA: Insufficient documentation

## 2019-09-21 DIAGNOSIS — Y939 Activity, unspecified: Secondary | ICD-10-CM | POA: Insufficient documentation

## 2019-09-21 DIAGNOSIS — S61311A Laceration without foreign body of left index finger with damage to nail, initial encounter: Secondary | ICD-10-CM | POA: Insufficient documentation

## 2019-09-21 DIAGNOSIS — Z794 Long term (current) use of insulin: Secondary | ICD-10-CM | POA: Insufficient documentation

## 2019-09-21 DIAGNOSIS — Y999 Unspecified external cause status: Secondary | ICD-10-CM | POA: Insufficient documentation

## 2019-09-21 MED ORDER — SULFAMETHOXAZOLE-TRIMETHOPRIM 800-160 MG PO TABS: 1.0000 | ORAL_TABLET | Freq: Two times a day (BID) | ORAL | 0 refills | Status: AC

## 2019-09-21 MED ORDER — CEPHALEXIN 500 MG PO CAPS: 500.0000 mg | ORAL_CAPSULE | Freq: Three times a day (TID) | ORAL | 0 refills | Status: AC

## 2019-09-21 MED ORDER — LIDOCAINE HCL 1 % IJ SOLN
10.0000 mL | Freq: Once | INTRAMUSCULAR | Status: AC
  Administered 2019-09-21: 18:00:00 10 mL
  Filled 2019-09-21: qty 10

## 2019-09-21 MED ORDER — TETANUS-DIPHTH-ACELL PERTUSSIS 5-2.5-18.5 LF-MCG/0.5 IM SUSP
0.5000 mL | Freq: Once | INTRAMUSCULAR | Status: AC
  Administered 2019-09-21: 18:00:00 0.5 mL via INTRAMUSCULAR
  Filled 2019-09-21: qty 0.5

## 2019-09-21 NOTE — ED Triage Notes (Signed)
Pt to ED via POV c/o laceration to the left middle finger. Pt states that it happened about 1 hour PTA. Bleeding is controlled at this time.

## 2019-09-21 NOTE — ED Provider Notes (Signed)
Emergency Department Provider Note  ____________________________________________  Time seen: Approximately 5:41 PM  I have reviewed the triage vital signs and the nursing notes.   HISTORY  Chief Complaint Laceration   Historian Patient     HPI Jimmy Olson is a 59 y.o. male presents to the emergency department with a left middle finger avulsion type laceration along the fingertip with complete removal fingernail.  Patient states that he had no pain.  No numbness or tingling in the left middle finger.  Patient cannot recall his last tetanus shot.   Past Medical History:  Diagnosis Date  . Cardiomyopathy ([place])    a. 07/2009 MV: EF 48%; b. 08/2019 Echo: EF 45-50%. Global HK. Mod LVH. Gr1 DD. Nl RV fxn. Nl PASP.  Marland Kitchen Chest pain    a. 07/2009 MV: EF 48%. No ischemia.  . Diabetes mellitus without complication ([place])   . HFmrEF (heart failure with mildy reduced ejection fraction) ([place])   . Hypertension      Immunizations up to date:  Yes.     Past Medical History:  Diagnosis Date  . Cardiomyopathy ([place])    a. 07/2009 MV: EF 48%; b. 08/2019 Echo: EF 45-50%. Global HK. Mod LVH. Gr1 DD. Nl RV fxn. Nl PASP.  Marland Kitchen Chest pain    a. 07/2009 MV: EF 48%. No ischemia.  . Diabetes mellitus without complication ([place])   . HFmrEF (heart failure with mildy reduced ejection fraction) ([place])   . Hypertension     Patient Active Problem List   Diagnosis Date Noted  . HFrEF (heart failure with reduced ejection fraction) ([place])   . Essential hypertension   . Acute on chronic respiratory failure with hypoxia ([place]) 09/13/2019  . CAP (community acquired pneumonia) 09/11/2019  . Acute respiratory failure with hypercapnia ([place]) 09/11/2019  . Accelerated hypertension 09/11/2019  . Type 2 diabetes mellitus with hyperlipidemia ([place]) 09/11/2019  . Type 2 diabetes mellitus with other specified complication ([place]) 30/16/0109  . AKI (acute kidney injury) ([place]) 09/11/2019    History reviewed. No pertinent  surgical history.  Prior to Admission medications   Medication Sig Start Date End Date Taking? Authorizing Provider  amLODipine (NORVASC) 10 MG tablet Take 10 mg by mouth daily.    [provider]  aspirin EC 81 MG tablet Take 81 mg by mouth daily.    [provider]  bisoprolol (ZEBETA) 10 MG tablet Take 1 tablet (10 mg total) by mouth daily. 09/16/19   Ezekiel Slocumb, DO  cephALEXin (KEFLEX) 500 MG capsule Take 1 capsule (500 mg total) by mouth 3 (three) times daily for 7 days. 09/21/19 09/28/19  Lannie Fields, PA-C  guaiFENesin-dextromethorphan (ROBITUSSIN DM) 100-10 MG/5ML syrup Take 10 mLs by mouth every 6 (six) hours. 09/15/19   Nicole Kindred A, DO  hydrALAZINE (APRESOLINE) 50 MG tablet Take 1 tablet (50 mg total) by mouth 3 (three) times daily. 09/15/19   Nicole Kindred A, DO  insulin detemir (LEVEMIR) 100 UNIT/ML FlexPen Inject 20 Units into the skin 2 (two) times daily. 09/15/19   Nicole Kindred A, DO  insulin detemir (LEVEMIR) 100 UNIT/ML FlexPen Inject 20 Units into the skin 2 (two) times daily. 09/15/19   Ezekiel Slocumb, DO  insulin lispro (HUMALOG) 100 UNIT/ML KwikPen Inject 0.03 mLs (3 Units total) into the skin 3 (three) times daily with meals. Do not take if you do not eat more than 50% of meal 09/15/19   Nicole Kindred A, DO  isosorbide mononitrate (IMDUR) 30 MG  24 hr tablet Take 1 tablet (30 mg total) by mouth daily. 09/16/19   Nicole Kindred A, DO  lisinopril (ZESTRIL) 30 MG tablet Take 30 mg by mouth daily.    [provider]  nitroGLYCERIN (NITROSTAT) 0.4 MG SL tablet Place 1 tablet (0.4 mg total) under the tongue every 5 (five) minutes as needed for chest pain. 09/15/19   Nicole Kindred A, DO  rosuvastatin (CRESTOR) 10 MG tablet Take 10 mg by mouth daily.    [provider]  sulfamethoxazole-trimethoprim (BACTRIM DS) 800-160 MG tablet Take 1 tablet by mouth 2 (two) times daily for 7 days. 09/21/19 09/28/19  Lannie Fields, PA-C     Allergies Patient has no known allergies.  Family History  Problem Relation Age of Onset  . Kidney failure Mother        died @ 53  . Heart failure Mother   . Other Father        he never knew his father    Social History Social History   Tobacco Use  . Smoking status: Never Smoker  . Smokeless tobacco: Never Used  Substance Use Topics  . Alcohol use: Never  . Drug use: Never     Review of Systems  Constitutional: No fever/chills Eyes:  No discharge ENT: No upper respiratory complaints. Respiratory: no cough. No SOB/ use of accessory muscles to breath Gastrointestinal:   No nausea, no vomiting.  No diarrhea.  No constipation. Musculoskeletal: Left middle finger pain.  Skin: Negative for rash, abrasions, lacerations, ecchymosis.    ____________________________________________   PHYSICAL EXAM:  VITAL SIGNS: ED Triage Vitals [09/21/19 1613]  Enc Vitals Group     BP (!) 151/81     Pulse Rate (!) 107     Resp 16     Temp 98.3 F (36.8 C)     Temp Source Tympanic     SpO2 98 %     Weight      Height      Head Circumference      Peak Flow      Pain Score 4     Pain Loc      Pain Edu?      Excl. in [place]?      Constitutional: Alert and oriented. Well appearing and in no acute distress. Eyes: Conjunctivae are normal. PERRL. EOMI. Head: Atraumatic.  Cardiovascular: Normal rate, regular rhythm. Normal S1 and S2.  Good peripheral circulation. Respiratory: Normal respiratory effort without tachypnea or retractions. Lungs CTAB. Good air entry to the bases with no decreased or absent breath sounds Gastrointestinal: Bowel sounds x 4 quadrants. Soft and nontender to palpation. No guarding or rigidity. No distention. Musculoskeletal: No flexor or extensor tendon deficits appreciated with testing.  Does have bone exposure along distal phalanx of left middle finger along the dorsal aspect of the digit.  Palpable radial pulse, left.  Capillary refill less than 2  seconds of left middle finger. Neurologic:  Normal for age. No gross focal neurologic deficits are appreciated.  Skin: Patient has avulsion type laceration along dorsal aspect of the left distal fingertip with complete fingernail removal. Psychiatric: Mood and affect are normal for age. Speech and behavior are normal.   ____________________________________________   LABS (all labs ordered are listed, but only abnormal results are displayed)  Labs Reviewed - No data to display ____________________________________________  EKG   ____________________________________________  RADIOLOGY Unk Pinto, personally viewed and evaluated these images (plain radiographs) as part of my medical decision  making, as well as reviewing the written report by the radiologist.  DG Finger Middle Left  Result Date: 09/21/2019 CLINICAL DATA:  Crush injury, laceration EXAM: LEFT MIDDLE FINGER 2+V COMPARISON:  None. FINDINGS: There is a soft tissue and bone defect at the tip of the left middle finger. No additional fracture, subluxation or dislocation. IMPRESSION: Soft tissue and bone defect at the tip of the left middle finger distal phalanx. Electronically Signed   By: Rolm Baptise M.D.   On: 09/21/2019 16:46    ____________________________________________    PROCEDURES  Procedure(s) performed:     Marland KitchenMarland KitchenLaceration Repair  Date/Time: 09/21/2019 5:50 PM Performed by: Lannie Fields, PA-C Authorized by: Lannie Fields, PA-C   Consent:    Consent obtained:  Verbal   Consent given by:  Patient   Risks discussed:  Infection, pain, retained foreign body, poor cosmetic result and poor wound healing Anesthesia (see MAR for exact dosages):    Anesthesia method:  Local infiltration   Local anesthetic:  Lidocaine 1% w/o epi Laceration details:    Location:  Finger   Finger location:  L index finger   Length (cm):  3   Depth (mm):  1.5 Repair type:    Repair type:  Simple Exploration:     Hemostasis achieved with:  Direct pressure   Wound exploration: entire depth of wound probed and visualized     Contaminated: no   Treatment:    Area cleansed with:  Saline and Betadine   Amount of cleaning:  Extensive   Irrigation solution:  Sterile saline   Visualized foreign bodies/material removed: no   Skin repair:    Repair method:  Sutures   Suture size:  4-0   Number of sutures: 5. Approximation:    Approximation:  Close Post-procedure details:    Dressing:  Sterile dressing   Patient tolerance of procedure:  Tolerated well, no immediate complications Comments:     Patient had complete avulsion along the dorsal aspect of the left middle finger.  I used skin along the volar aspect of the digit as a biological dressing over distal phalanx bone exposure and secured loosely with sutures.        Medications  lidocaine (XYLOCAINE) 1 % (with pres) injection 10 mL (10 mLs Infiltration Given by Other 09/21/19 1733)  Tdap (BOOSTRIX) injection 0.5 mL (0.5 mLs Intramuscular Given 09/21/19 1732)     ____________________________________________   INITIAL IMPRESSION / ASSESSMENT AND PLAN / ED COURSE  Pertinent labs & imaging results that were available during my care of the patient were reviewed by me and considered in my medical decision making (see chart for details).      Assessment and plan Finger laceration 59 year old male presents to the emergency department with a laceration of the left middle finger sustained accidentally from a motor in a car.  Patient's finger was irrigated with normal saline and Betadine and surgical soap was used to removed grease from car part.  X-ray examination revealed a bone deformity at left distal phalanx.  Laceration was avulsion type in nature and cannot be completely repaired with sutures.  I did take skin along the volar aspect of the left middle finger as a biological dressing over bony fragment and secured loosely with sutures.  Patient's  tetanus status was updated and patient was discharged with both Bactrim and Keflex.  Return precautions were given to return with any erythema or streaking surrounding suture sites.  He voiced understanding and has easy access to  the ED.  Sutures were recommended to be removed in 7 days.    ____________________________________________  FINAL CLINICAL IMPRESSION(S) / ED DIAGNOSES  Final diagnoses:  Laceration of left middle finger without foreign body without damage to nail, initial encounter      NEW MEDICATIONS STARTED DURING THIS VISIT:  ED Discharge Orders         Ordered    cephALEXin (KEFLEX) 500 MG capsule  3 times daily     09/21/19 1727    sulfamethoxazole-trimethoprim (BACTRIM DS) 800-160 MG tablet  2 times daily     09/21/19 1727              This chart was dictated using voice recognition software/Dragon. Despite best efforts to proofread, errors can occur which can change the meaning. Any change was purely unintentional.     Lannie Fields, PA-C 09/21/19 1754    Nance Pear, MD 09/21/19 [phone]

## 2019-09-23 LAB — GLUCOSE, CAPILLARY: Glucose-Capillary: 29 mg/dL — CL (ref 70–99)

## 2019-09-23 NOTE — Progress Notes (Deleted)
   Patient ID: Jimmy Olson, male    DOB: [birth year], 59 y.o.   MRN: [id number]  HPI  Mr Saine is a 59 y/o male with a history of  Echo report from 09/11/19 reviewed and showed an EF of 45-50% along with moderate LVH.  Was in the ED 09/21/19 due to left finger laceration. Sutures placed and he was released. Admitted 09/11/19 due to pneumonia. Antibiotics given. Cardiology consult obtained. Medications adjusted for HTN. Discharged after 4 days.   He presents today for his initial visit with a chief complaint of  Review of Systems    Physical Exam    Assessment & Plan:  1: Chronic heart failure with reduced ejection fraction- - NYHA class - BNP 09/14/19 was 499.0  2: HTN- - BP - BMP 09/15/19 reviewed and showed sodium 140, potassium 4.3, creatinine 1.84 and GFR 40  3: DM- - A1c 09/11/19 was 9.8%

## 2019-09-24 ENCOUNTER — Telehealth: Payer: Self-pay | Admitting: Family

## 2019-09-24 ENCOUNTER — Ambulatory Visit: Payer: Self-pay | Admitting: Family

## 2019-09-24 NOTE — Telephone Encounter (Signed)
Patient did not show for his [facility] appointment on 09/24/19. Will attempt to reschedule.

## 2019-09-29 ENCOUNTER — Encounter: Payer: Self-pay | Admitting: Emergency Medicine

## 2019-09-29 ENCOUNTER — Other Ambulatory Visit: Payer: Self-pay

## 2019-09-29 ENCOUNTER — Emergency Department: Payer: Self-pay

## 2019-09-29 ENCOUNTER — Emergency Department
Admission: EM | Admit: 2019-09-29 | Discharge: 2019-09-29 | Disposition: A | Discharge status: Home / Self Care | Payer: Self-pay | Attending: Emergency Medicine | Admitting: Emergency Medicine

## 2019-09-29 DIAGNOSIS — R0789 Other chest pain: Secondary | ICD-10-CM | POA: Insufficient documentation

## 2019-09-29 DIAGNOSIS — Z79899 Other long term (current) drug therapy: Secondary | ICD-10-CM | POA: Insufficient documentation

## 2019-09-29 DIAGNOSIS — R0602 Shortness of breath: Secondary | ICD-10-CM | POA: Insufficient documentation

## 2019-09-29 DIAGNOSIS — I509 Heart failure, unspecified: Secondary | ICD-10-CM | POA: Insufficient documentation

## 2019-09-29 DIAGNOSIS — I110 Hypertensive heart disease with heart failure: Secondary | ICD-10-CM | POA: Insufficient documentation

## 2019-09-29 DIAGNOSIS — Z4802 Encounter for removal of sutures: Secondary | ICD-10-CM | POA: Insufficient documentation

## 2019-09-29 DIAGNOSIS — R079 Chest pain, unspecified: Secondary | ICD-10-CM

## 2019-09-29 DIAGNOSIS — Z794 Long term (current) use of insulin: Secondary | ICD-10-CM | POA: Insufficient documentation

## 2019-09-29 DIAGNOSIS — E119 Type 2 diabetes mellitus without complications: Secondary | ICD-10-CM | POA: Insufficient documentation

## 2019-09-29 LAB — BASIC METABOLIC PANEL
Anion gap: 7 (ref 5–15)
BUN: 27 mg/dL — ABNORMAL HIGH (ref 6–20)
CO2: 25 mmol/L (ref 22–32)
Calcium: 9 mg/dL (ref 8.9–10.3)
Chloride: 104 mmol/L (ref 98–111)
Creatinine, Ser: 1.99 mg/dL — ABNORMAL HIGH (ref 0.61–1.24)
GFR calc Af Amer: 42 mL/min — ABNORMAL LOW (ref >60)
GFR calc non Af Amer: 36 mL/min — ABNORMAL LOW (ref >60)
Glucose, Bld: 234 mg/dL — ABNORMAL HIGH (ref 70–99)
Potassium: 4.5 mmol/L (ref 3.5–5.1)
Sodium: 136 mmol/L (ref 135–145)

## 2019-09-29 LAB — CBC
HCT: 33.9 % — ABNORMAL LOW (ref 39.0–52.0)
Hemoglobin: 11.6 g/dL — ABNORMAL LOW (ref 13.0–17.0)
MCH: 28.9 pg (ref 26.0–34.0)
MCHC: 34.2 g/dL (ref 30.0–36.0)
MCV: 84.5 fL (ref 80.0–100.0)
Platelets: 278 10*3/uL (ref 150–400)
RBC: 4.01 MIL/uL — ABNORMAL LOW (ref 4.22–5.81)
RDW: 11.8 % (ref 11.5–15.5)
WBC: 6 10*3/uL (ref 4.0–10.5)
nRBC: 0 % (ref 0.0–0.2)

## 2019-09-29 LAB — TROPONIN I (HIGH SENSITIVITY)
Troponin I (High Sensitivity): 34 ng/L — ABNORMAL HIGH (ref <18)
Troponin I (High Sensitivity): 33 ng/L — ABNORMAL HIGH (ref <18)

## 2019-09-29 NOTE — ED Triage Notes (Signed)
While taking pts BP, he inquired as to how he could see a cardiologist. Per interpreter, pt has been having some intermittent chest pain and has been getting sob when walking. Pt reports was treated for pneumonia and was told he would see a cardiologist afterwards but has not gotten any direction.

## 2019-09-29 NOTE — ED Triage Notes (Signed)
Pt here to have stitches removed from left hand index finger. Needs interpreter

## 2019-09-29 NOTE — ED Provider Notes (Signed)
[facility] Emergency Department Provider Note  Time seen: 1:19 PM  I have reviewed the triage vital signs and the nursing notes.   HISTORY  Chief Complaint Suture / Staple Removal, Chest Pain, and Shortness of Breath   HPI Jimmy Olson is a 59 y.o. male with a past medical history of cardiomyopathy, chest pain, diabetes, hypertension presents to the emergency department for suture removal but also states chest pain in triage.  According to the patient he recently injured his left middle finger while attempting to work on a transmission.  Patient had essentially avulsed the tip of the left finger off which was repaired and he was told to return for suture removal today.  Patient in triage also noted that he has been experiencing chest pain.  Patient states chest pain has been an ongoing issue for many years per patient ever since he was diagnosed with diabetes.  Denies any worsening of the chest pain recently.  Patient states he was recently admitted for pneumonia and was told that he needs to follow-up with a cardiologist but does not know how to do so.  Denies any shortness of breath.  No leg pain or swelling.  Largely negative review of systems otherwise.   Past Medical History:  Diagnosis Date  . Cardiomyopathy ([place])    a. 07/2009 MV: EF 48%; b. 08/2019 Echo: EF 45-50%. Global HK. Mod LVH. Gr1 DD. Nl RV fxn. Nl PASP.  Marland Kitchen Chest pain    a. 07/2009 MV: EF 48%. No ischemia.  . Diabetes mellitus without complication ([place])   . HFmrEF (heart failure with mildy reduced ejection fraction) ([place])   . Hypertension     Patient Active Problem List   Diagnosis Date Noted  . HFrEF (heart failure with reduced ejection fraction) ([place])   . Essential hypertension   . Acute on chronic respiratory failure with hypoxia ([place]) 09/13/2019  . CAP (community acquired pneumonia) 09/11/2019  . Acute respiratory failure with hypercapnia ([place]) 09/11/2019  . Accelerated hypertension  09/11/2019  . Type 2 diabetes mellitus with hyperlipidemia ([place]) 09/11/2019  . Type 2 diabetes mellitus with other specified complication ([place]) 25/10/3974  . AKI (acute kidney injury) ([place]) 09/11/2019    History reviewed. No pertinent surgical history.  Prior to Admission medications   Medication Sig Start Date End Date Taking? Authorizing Provider  amLODipine (NORVASC) 10 MG tablet Take 10 mg by mouth daily.    [provider]  aspirin EC 81 MG tablet Take 81 mg by mouth daily.    [provider]  bisoprolol (ZEBETA) 10 MG tablet Take 1 tablet (10 mg total) by mouth daily. 09/16/19   Ezekiel Slocumb, DO  guaiFENesin-dextromethorphan (ROBITUSSIN DM) 100-10 MG/5ML syrup Take 10 mLs by mouth every 6 (six) hours. 09/15/19   Nicole Kindred A, DO  hydrALAZINE (APRESOLINE) 50 MG tablet Take 1 tablet (50 mg total) by mouth 3 (three) times daily. 09/15/19   Nicole Kindred A, DO  insulin detemir (LEVEMIR) 100 UNIT/ML FlexPen Inject 20 Units into the skin 2 (two) times daily. 09/15/19   Nicole Kindred A, DO  insulin detemir (LEVEMIR) 100 UNIT/ML FlexPen Inject 20 Units into the skin 2 (two) times daily. 09/15/19   Ezekiel Slocumb, DO  insulin lispro (HUMALOG) 100 UNIT/ML KwikPen Inject 0.03 mLs (3 Units total) into the skin 3 (three) times daily with meals. Do not take if you do not eat more than 50% of meal 09/15/19   Ezekiel Slocumb, DO  isosorbide mononitrate (IMDUR) 30 MG 24 hr tablet Take 1 tablet (30 mg total) by mouth daily. 09/16/19   Nicole Kindred A, DO  lisinopril (ZESTRIL) 30 MG tablet Take 30 mg by mouth daily.    [provider]  nitroGLYCERIN (NITROSTAT) 0.4 MG SL tablet Place 1 tablet (0.4 mg total) under the tongue every 5 (five) minutes as needed for chest pain. 09/15/19   Nicole Kindred A, DO  rosuvastatin (CRESTOR) 10 MG tablet Take 10 mg by mouth daily.    [provider]    No Known Allergies  Family History  Problem Relation Age of Onset   . Kidney failure Mother        died @ 40  . Heart failure Mother   . Other Father        he never knew his father    Social History Social History   Tobacco Use  . Smoking status: Never Smoker  . Smokeless tobacco: Never Used  Substance Use Topics  . Alcohol use: Never  . Drug use: Never    Review of Systems Constitutional: Negative for fever. Cardiovascular: Patient states daily chest pain times years, no acute worsening. Respiratory: Negative for shortness of breath. Gastrointestinal: Negative for abdominal pain, vomiting Musculoskeletal: Recent avulsion/laceration to left middle finger. Neurological: Negative for headache All other ROS negative  ____________________________________________   PHYSICAL EXAM:  VITAL SIGNS: ED Triage Vitals  Enc Vitals Group     BP 09/29/19 1001 (!) 150/85     Pulse Rate 09/29/19 1001 93     Resp 09/29/19 1001 16     Temp 09/29/19 1001 98.8 F (37.1 C)     Temp Source 09/29/19 1001 Oral     SpO2 09/29/19 1001 97 %     Weight 09/29/19 0949 160 lb (72.6 kg)     Height 09/29/19 0949 5\' 6"  (1.676 m)     Head Circumference --      Peak Flow --      Pain Score 09/29/19 0949 0     Pain Loc --      Pain Edu? --      Excl. in [place]? --    Constitutional: Alert. Well appearing and in no distress. Eyes: Normal exam ENT      Head: Normocephalic and atraumatic.      Mouth/Throat: Mucous membranes are moist. Cardiovascular: Normal rate, regular rhythm. No murmur Respiratory: Normal respiratory effort without tachypnea nor retractions. Breath sounds are clear  Gastrointestinal: Soft and nontender. No distention.   Musculoskeletal: Patient has a very well-appearing laceration/avulsion injury to the left index fingertip.  Well-appearing sutured laceration.  Sutures were removed by myself.  No signs of infection or dehiscence. Neurologic:  Normal speech and language. No gross focal neurologic deficits  Skin:  Skin is warm, dry Psychiatric:  Mood and affect are normal.  ____________________________________________    EKG  EKG viewed and interpreted by myself shows a normal sinus rhythm at 90 bpm with a narrow QRS, normal axis, normal intervals, no concerning ST changes.  ____________________________________________    RADIOLOGY  X-ray shows no acute abnormality  ____________________________________________   INITIAL IMPRESSION / ASSESSMENT AND PLAN / ED COURSE  Pertinent labs & imaging results that were available during my care of the patient were reviewed by me and considered in my medical decision making (see chart for details).   Patient presents to the emergency department for suture removal.  I removed the patient sutures from his left middle finger.  Very well-appearing healing laceration/fingertip avulsion injury to the left middle finger.  No signs of dehiscence, no signs of infection or erythema.  Patient will follow up with his doctor at [name] for recheck in approximately 1 to 2 weeks.  Patient states chest discomfort but states it is an ongoing daily issue for him times years denies any worsening of the chest pain.  Patient's troponin is slightly elevated today 34, however this is considerably lower than it was 2 weeks ago.  We will repeat a troponin to help rule out ACS.  EKG is reassuring.  If the patient's repeat troponin is largely unchanged I believe the patient would be safe for discharge home with outpatient cardiology follow-up for a stress test.  I will refer the patient to a cardiologist.  Patient's repeat troponin is unchanged.  We will discharge patient from the emergency department.  Jimmy Olson was evaluated in Emergency Department on 09/29/2019 for the symptoms described in the history of present illness. He was evaluated in the context of the global COVID-19 pandemic, which necessitated consideration that the patient might be at risk for infection with the SARS-CoV-2 virus that causes  COVID-19. Institutional protocols and algorithms that pertain to the evaluation of patients at risk for COVID-19 are in a state of rapid change based on information released by regulatory bodies including the CDC and federal and state organizations. These policies and algorithms were followed during the patient's care in the ED.  Patient presents for suture removal. The wound is well healed without signs of infection.  The sutures are removed. Wound care and activity instructions given. Return prn.   ____________________________________________   FINAL CLINICAL IMPRESSION(S) / ED DIAGNOSES  Suture removal Chest pain   Harvest Dark, MD 09/29/19 1420

## 2019-09-29 NOTE — Discharge Instructions (Signed)
Please call the number provided for cardiology to arrange a follow-up appointment within the next 1 week for recheck/reevaluation.  Return to the emergency department for any worsening of chest pain, trouble breathing, or any other symptom personally concerning to yourself.

## 2019-10-16 ENCOUNTER — Telehealth: Payer: Self-pay

## 2019-11-27 NOTE — Congregational Nurse Program (Signed)
Patient asking for referral to optometrist/optomolosigist. Assisted patient in filling out [employer] application. Pt states he has high blood pressure and takes Norvasc 10 mg once a day and hydralazine 50 mg 3 times a day. Pt blood pressure was 188/99. Had not taken his blood pressure medications prior to taking his blood pressure.

## 2020-01-08 NOTE — Congregational Nurse Program (Signed)
Patient needing assistance with applying for medicaid. Patient given medicaid application.

## 2020-02-04 DIAGNOSIS — N1832 Chronic kidney disease, stage 3b: Secondary | ICD-10-CM | POA: Diagnosis not present

## 2020-02-04 DIAGNOSIS — I10 Essential (primary) hypertension: Secondary | ICD-10-CM | POA: Diagnosis not present

## 2020-02-04 DIAGNOSIS — E8809 Other disorders of plasma-protein metabolism, not elsewhere classified: Secondary | ICD-10-CM | POA: Diagnosis not present

## 2020-02-04 DIAGNOSIS — N179 Acute kidney failure, unspecified: Secondary | ICD-10-CM | POA: Diagnosis not present

## 2020-02-09 DIAGNOSIS — Z20822 Contact with and (suspected) exposure to covid-19: Secondary | ICD-10-CM | POA: Diagnosis not present

## 2020-02-18 DIAGNOSIS — U071 COVID-19: Secondary | ICD-10-CM | POA: Diagnosis present

## 2020-02-18 DIAGNOSIS — D631 Anemia in chronic kidney disease: Secondary | ICD-10-CM | POA: Diagnosis present

## 2020-02-18 DIAGNOSIS — N185 Chronic kidney disease, stage 5: Secondary | ICD-10-CM | POA: Diagnosis present

## 2020-02-18 HISTORY — DX: COVID-19: U07.1

## 2020-03-06 ENCOUNTER — Other Ambulatory Visit: Payer: Self-pay

## 2020-03-06 ENCOUNTER — Emergency Department: Payer: BC Managed Care – PPO

## 2020-03-06 ENCOUNTER — Emergency Department
Admission: EM | Admit: 2020-03-06 | Discharge: 2020-03-06 | Disposition: A | Discharge status: Home / Self Care | Payer: BC Managed Care – PPO | Attending: Emergency Medicine | Admitting: Emergency Medicine

## 2020-03-06 DIAGNOSIS — I509 Heart failure, unspecified: Secondary | ICD-10-CM | POA: Diagnosis not present

## 2020-03-06 DIAGNOSIS — Z79899 Other long term (current) drug therapy: Secondary | ICD-10-CM | POA: Diagnosis not present

## 2020-03-06 DIAGNOSIS — Z8616 Personal history of COVID-19: Secondary | ICD-10-CM

## 2020-03-06 DIAGNOSIS — R0602 Shortness of breath: Secondary | ICD-10-CM | POA: Diagnosis not present

## 2020-03-06 DIAGNOSIS — I110 Hypertensive heart disease with heart failure: Secondary | ICD-10-CM | POA: Insufficient documentation

## 2020-03-06 DIAGNOSIS — Z7982 Long term (current) use of aspirin: Secondary | ICD-10-CM | POA: Insufficient documentation

## 2020-03-06 DIAGNOSIS — Z794 Long term (current) use of insulin: Secondary | ICD-10-CM | POA: Diagnosis not present

## 2020-03-06 DIAGNOSIS — E119 Type 2 diabetes mellitus without complications: Secondary | ICD-10-CM | POA: Insufficient documentation

## 2020-03-06 DIAGNOSIS — R0600 Dyspnea, unspecified: Secondary | ICD-10-CM | POA: Insufficient documentation

## 2020-03-06 DIAGNOSIS — U071 COVID-19: Secondary | ICD-10-CM

## 2020-03-06 HISTORY — DX: Personal history of COVID-19: Z86.16

## 2020-03-06 LAB — CBC
HCT: 31.2 % — ABNORMAL LOW (ref 39.0–52.0)
Hemoglobin: 10.8 g/dL — ABNORMAL LOW (ref 13.0–17.0)
MCH: 29.9 pg (ref 26.0–34.0)
MCHC: 34.6 g/dL (ref 30.0–36.0)
MCV: 86.4 fL (ref 80.0–100.0)
Platelets: 178 10*3/uL (ref 150–400)
RBC: 3.61 MIL/uL — ABNORMAL LOW (ref 4.22–5.81)
RDW: 11.6 % (ref 11.5–15.5)
WBC: 2.9 10*3/uL — ABNORMAL LOW (ref 4.0–10.5)
nRBC: 0 % (ref 0.0–0.2)

## 2020-03-06 LAB — BASIC METABOLIC PANEL
Anion gap: 10 (ref 5–15)
BUN: 41 mg/dL — ABNORMAL HIGH (ref 6–20)
CO2: 25 mmol/L (ref 22–32)
Calcium: 8.5 mg/dL — ABNORMAL LOW (ref 8.9–10.3)
Chloride: 99 mmol/L (ref 98–111)
Creatinine, Ser: 2.6 mg/dL — ABNORMAL HIGH (ref 0.61–1.24)
GFR calc Af Amer: 30 mL/min — ABNORMAL LOW (ref >60)
GFR calc non Af Amer: 26 mL/min — ABNORMAL LOW (ref >60)
Glucose, Bld: 196 mg/dL — ABNORMAL HIGH (ref 70–99)
Potassium: 4.7 mmol/L (ref 3.5–5.1)
Sodium: 134 mmol/L — ABNORMAL LOW (ref 135–145)

## 2020-03-06 LAB — TROPONIN I (HIGH SENSITIVITY)
Troponin I (High Sensitivity): 87 ng/L — ABNORMAL HIGH (ref <18)
Troponin I (High Sensitivity): 91 ng/L — ABNORMAL HIGH (ref <18)

## 2020-03-06 LAB — SARS CORONAVIRUS 2 BY RT PCR (HOSPITAL ORDER, PERFORMED IN ~~LOC~~ HOSPITAL LAB): SARS Coronavirus 2: POSITIVE — AB

## 2020-03-06 MED ORDER — GUAIFENESIN-CODEINE 100-10 MG/5ML PO SOLN: 5.0000 mL | Freq: Four times a day (QID) | ORAL | 0 refills | Status: DC | PRN

## 2020-03-06 MED ORDER — PREDNISONE 10 MG PO TABS: 10.0000 mg | ORAL_TABLET | Freq: Every day | ORAL | 0 refills | Status: DC

## 2020-03-06 MED ORDER — AMLODIPINE BESYLATE 10 MG PO TABS: 10.0000 mg | ORAL_TABLET | Freq: Every day | ORAL | 0 refills | Status: DC

## 2020-03-06 MED ORDER — LISINOPRIL 30 MG PO TABS: 30.0000 mg | ORAL_TABLET | Freq: Every day | ORAL | 0 refills | Status: DC

## 2020-03-06 MED ORDER — ONDANSETRON HCL 4 MG PO TABS: 4.0000 mg | ORAL_TABLET | Freq: Every day | ORAL | 0 refills | Status: DC | PRN

## 2020-03-06 NOTE — ED Triage Notes (Signed)
Interpreter on the stick used. Pt comes POV with SOB. Pt states he feels like he can't breathe. His back hurts and his head hurts. Nose is running. States it has been going on for 2 days.

## 2020-03-06 NOTE — ED Notes (Signed)
MD at bedside with interpretor; no further questions from the pt at this time; pt verbalizes understanding of discharge instructions.

## 2020-03-06 NOTE — ED Provider Notes (Signed)
[facility] Emergency Department Provider Note  Time seen: 3:22 PM  I have reviewed the triage vital signs and the nursing notes.   HISTORY  Chief Complaint Shortness of Breath   HPI Jimmy Olson is a 59 y.o. male with a past medical history of cardiomyopathy, past history of chest pain diabetes, hypertension, chronic kidney disease, presents to the emergency department for chest tightness, headache, body aches and mild shortness of breath.  Patient has not been vaccinated against Covid, has not had Covid in the past.  Patient states his main concern is finding out if he has Covid.  Patient states his symptoms began yesterday morning with mild shortness of breath and mild chest tightness.  States later in the day he developed a headache and some body aches.  Today denies any headache but states a sensation of generalized weakness.   Past Medical History:  Diagnosis Date  . Cardiomyopathy ([place])    a. 07/2009 MV: EF 48%; b. 08/2019 Echo: EF 45-50%. Global HK. Mod LVH. Gr1 DD. Nl RV fxn. Nl PASP.  Marland Kitchen Chest pain    a. 07/2009 MV: EF 48%. No ischemia.  . Diabetes mellitus without complication ([place])   . HFmrEF (heart failure with mildy reduced ejection fraction) ([place])   . Hypertension     Patient Active Problem List   Diagnosis Date Noted  . HFrEF (heart failure with reduced ejection fraction) ([place])   . Essential hypertension   . Acute on chronic respiratory failure with hypoxia ([place]) 09/13/2019  . CAP (community acquired pneumonia) 09/11/2019  . Acute respiratory failure with hypercapnia ([place]) 09/11/2019  . Accelerated hypertension 09/11/2019  . Type 2 diabetes mellitus with hyperlipidemia ([place]) 09/11/2019  . Type 2 diabetes mellitus with other specified complication ([place]) 26/71/2458  . AKI (acute kidney injury) ([place]) 09/11/2019    History reviewed. No pertinent surgical history.  Prior to Admission medications   Medication Sig Start Date End Date Taking?  Authorizing Provider  amLODipine (NORVASC) 10 MG tablet Take 10 mg by mouth daily.    [provider]  aspirin EC 81 MG tablet Take 81 mg by mouth daily.    [provider]  bisoprolol (ZEBETA) 10 MG tablet Take 1 tablet (10 mg total) by mouth daily. 09/16/19   Ezekiel Slocumb, DO  guaiFENesin-dextromethorphan (ROBITUSSIN DM) 100-10 MG/5ML syrup Take 10 mLs by mouth every 6 (six) hours. 09/15/19   Nicole Kindred A, DO  hydrALAZINE (APRESOLINE) 50 MG tablet Take 1 tablet (50 mg total) by mouth 3 (three) times daily. 09/15/19   Nicole Kindred A, DO  insulin detemir (LEVEMIR) 100 UNIT/ML FlexPen Inject 20 Units into the skin 2 (two) times daily. 09/15/19   Nicole Kindred A, DO  insulin detemir (LEVEMIR) 100 UNIT/ML FlexPen Inject 20 Units into the skin 2 (two) times daily. 09/15/19   Ezekiel Slocumb, DO  insulin lispro (HUMALOG) 100 UNIT/ML KwikPen Inject 0.03 mLs (3 Units total) into the skin 3 (three) times daily with meals. Do not take if you do not eat more than 50% of meal 09/15/19   Nicole Kindred A, DO  isosorbide mononitrate (IMDUR) 30 MG 24 hr tablet Take 1 tablet (30 mg total) by mouth daily. 09/16/19   Nicole Kindred A, DO  lisinopril (ZESTRIL) 30 MG tablet Take 30 mg by mouth daily.    [provider]  nitroGLYCERIN (NITROSTAT) 0.4 MG SL tablet Place 1 tablet (0.4 mg total) under the tongue every 5 (five) minutes as needed  for chest pain. 09/15/19   Nicole Kindred A, DO  rosuvastatin (CRESTOR) 10 MG tablet Take 10 mg by mouth daily.    [provider]    No Known Allergies  Family History  Problem Relation Age of Onset  . Kidney failure Mother        died @ 4  . Heart failure Mother   . Other Father        he never knew his father    Social History Social History   Tobacco Use  . Smoking status: Never Smoker  . Smokeless tobacco: Never Used  Substance Use Topics  . Alcohol use: Never  . Drug use: Never    Review of  Systems Constitutional: Negative for fever. Cardiovascular: Chest tightness Respiratory: Mild shortness of breath Gastrointestinal: Negative for abdominal pain, vomiting Musculoskeletal: Mild body aches Neurological: Headache yesterday, now resolved All other ROS negative  ____________________________________________   PHYSICAL EXAM:  VITAL SIGNS: ED Triage Vitals  Enc Vitals Group     BP 03/06/20 1252 (!) 155/82     Pulse Rate 03/06/20 1252 97     Resp 03/06/20 1252 18     Temp 03/06/20 1252 98.9 F (37.2 C)     Temp Source 03/06/20 1252 Oral     SpO2 03/06/20 1252 97 %     Weight 03/06/20 1302 158 lb 11.7 oz (72 kg)     Height 03/06/20 1302 5\' 6"  (1.676 m)     Head Circumference --      Peak Flow --      Pain Score 03/06/20 1252 0     Pain Loc --      Pain Edu? --      Excl. in [place]? --    Constitutional: Alert. Well appearing and in no distress. Eyes: Normal exam ENT      Head: Normocephalic and atraumatic.      Mouth/Throat: Mucous membranes are moist. Cardiovascular: Normal rate, regular rhythm. No murmur Respiratory: Normal respiratory effort without tachypnea nor retractions. Breath sounds are clear without wheeze rales or rhonchi Gastrointestinal: Soft and nontender. No distention. Musculoskeletal: Nontender with normal range of motion in all extremities. No lower extremity tenderness or edema. Neurologic:  Normal speech and language. No gross focal neurologic deficits  Skin:  Skin is warm, dry and intact.  Psychiatric: Mood and affect are normal.   ____________________________________________    EKG  EKG viewed and interpreted by myself shows a normal sinus rhythm at 98 bpm with a narrow QRS, normal axis, normal intervals, no concerning ST changes.  ____________________________________________    RADIOLOGY  Chest x-ray is negative  ____________________________________________   INITIAL IMPRESSION / ASSESSMENT AND PLAN / ED COURSE  Pertinent labs  & imaging results that were available during my care of the patient were reviewed by me and considered in my medical decision making (see chart for details).   Patient presents to the emergency department for shortness of breath mild chest tightness headache body aches states nasal congestion as well.  Symptoms began yesterday.  Vital signs overall reassuring mildly hypertensive otherwise largely within normal limits.  Patient's initial lab work does show an elevated troponin however he has a history of elevated troponins in the past we will repeat a troponin after 2 to 3 hours.  Patient is mildly anemic but again largely unchanged from baseline.  Patient does have chronic kidney disease with a current creatinine of 2.6, slightly worse than the baseline, but I believe would be appropriate  for outpatient follow-up.  We will swab for coronavirus, we will repeat a troponin and continue to closely monitor in the emergency department.  Overall patient appears well.  Repeat troponin largely unchanged from initial.  Patient's Covid test is positive which would explain the patient's symptoms.  However he continues to sat 97 to 98% on room air.  I believe the patient is appropriate for discharge however given his comorbidities I will refer for monoclonal antibody infusion.  Patient agreeable to plan of care.  We will discharge with a taper of steroids, cough medication nausea medication.  BYRL LATIN was evaluated in Emergency Department on 03/06/2020 for the symptoms described in the history of present illness. He was evaluated in the context of the global COVID-19 pandemic, which necessitated consideration that the patient might be at risk for infection with the SARS-CoV-2 virus that causes COVID-19. Institutional protocols and algorithms that pertain to the evaluation of patients at risk for COVID-19 are in a state of rapid change based on information released by regulatory bodies including the CDC and federal and  state organizations. These policies and algorithms were followed during the patient's care in the ED.  ____________________________________________   FINAL CLINICAL IMPRESSION(S) / ED DIAGNOSES  Dyspnea COVID-19   Harvest Dark, MD 03/06/20 1631

## 2020-03-06 NOTE — ED Notes (Signed)
Dr. Kerman Passey aware of BP 188/107, pt states he takes amlodipine and lisonopril; refill prescriptions provided and pt instructed to take the medication as prescribed.

## 2020-03-07 ENCOUNTER — Other Ambulatory Visit: Payer: Self-pay | Admitting: Unknown Physician Specialty

## 2020-03-07 ENCOUNTER — Telehealth: Payer: Self-pay | Admitting: Unknown Physician Specialty

## 2020-03-07 DIAGNOSIS — E785 Hyperlipidemia, unspecified: Secondary | ICD-10-CM

## 2020-03-07 DIAGNOSIS — I10 Essential (primary) hypertension: Secondary | ICD-10-CM

## 2020-03-07 DIAGNOSIS — U071 COVID-19: Secondary | ICD-10-CM

## 2020-03-07 NOTE — Telephone Encounter (Signed)
I connected by phone with Jimmy Olson with assistance of a spanish interpretor on 03/07/2020 at 8:47 AM to discuss the potential use of a new treatment for mild to moderate COVID-19 viral infection in non-hospitalized patients.  This patient is a 59 y.o. male that meets the FDA criteria for Emergency Use Authorization of COVID monoclonal antibody casirivimab/imdevimab.  Has a (+) direct SARS-CoV-2 viral test result  Has mild or moderate COVID-19   Is NOT hospitalized due to COVID-19  Is within 10 days of symptom onset  Has at least one of the high risk factor(s) for progression to severe COVID-19 and/or hospitalization as defined in EUA.  Specific high risk criteria : Diabetes   I have spoken and communicated the following to the patient or parent/caregiver regarding COVID monoclonal antibody treatment:  1. FDA has authorized the emergency use for the treatment of mild to moderate COVID-19 in adults and pediatric patients with positive results of direct SARS-CoV-2 viral testing who are 89 years of age and older weighing at least 40 kg, and who are at high risk for progressing to severe COVID-19 and/or hospitalization.  2. The significant known and potential risks and benefits of COVID monoclonal antibody, and the extent to which such potential risks and benefits are unknown.  3. Information on available alternative treatments and the risks and benefits of those alternatives, including clinical trials.  4. Patients treated with COVID monoclonal antibody should continue to self-isolate and use infection control measures (e.g., wear mask, isolate, social distance, avoid sharing personal items, clean and disinfect high touch surfaces, and frequent handwashing) according to CDC guidelines.   5. The patient or parent/caregiver has the option to accept or refuse COVID monoclonal antibody treatment.  After reviewing this information with the patient, The patient agreed to proceed with  receiving casirivimab\imdevimab infusion and will be provided a copy of the Fact sheet prior to receiving the infusion. [name] 03/07/2020 8:47 AM  Sx onset 9/18

## 2020-03-08 ENCOUNTER — Ambulatory Visit (HOSPITAL_COMMUNITY)
Admission: RE | Admit: 2020-03-08 | Discharge: 2020-03-08 | Disposition: A | Discharge status: Home / Self Care | Payer: BC Managed Care – PPO | Source: Ambulatory Visit | Attending: Pulmonary Disease | Admitting: Pulmonary Disease

## 2020-03-08 DIAGNOSIS — E785 Hyperlipidemia, unspecified: Secondary | ICD-10-CM | POA: Insufficient documentation

## 2020-03-08 DIAGNOSIS — E1169 Type 2 diabetes mellitus with other specified complication: Secondary | ICD-10-CM | POA: Insufficient documentation

## 2020-03-08 DIAGNOSIS — I10 Essential (primary) hypertension: Secondary | ICD-10-CM | POA: Insufficient documentation

## 2020-03-08 DIAGNOSIS — U071 COVID-19: Secondary | ICD-10-CM | POA: Diagnosis not present

## 2020-03-08 MED ORDER — SODIUM CHLORIDE 0.9 % IV SOLN
1200.0000 mg | Freq: Once | INTRAVENOUS | Status: AC
  Administered 2020-03-08: 1200 mg via INTRAVENOUS

## 2020-03-08 MED ORDER — METHYLPREDNISOLONE SODIUM SUCC 125 MG IJ SOLR: 125.0000 mg | Freq: Once | INTRAMUSCULAR | Status: DC | PRN

## 2020-03-08 MED ORDER — EPINEPHRINE 0.3 MG/0.3ML IJ SOAJ: 0.3000 mg | Freq: Once | INTRAMUSCULAR | Status: DC | PRN

## 2020-03-08 MED ORDER — ALBUTEROL SULFATE HFA 108 (90 BASE) MCG/ACT IN AERS: 2.0000 | INHALATION_SPRAY | Freq: Once | RESPIRATORY_TRACT | Status: DC | PRN

## 2020-03-08 MED ORDER — SODIUM CHLORIDE 0.9 % IV SOLN: INTRAVENOUS | Status: DC | PRN

## 2020-03-08 MED ORDER — DIPHENHYDRAMINE HCL 50 MG/ML IJ SOLN: 50.0000 mg | Freq: Once | INTRAMUSCULAR | Status: DC | PRN

## 2020-03-08 MED ORDER — FAMOTIDINE IN NACL 20-0.9 MG/50ML-% IV SOLN: 20.0000 mg | Freq: Once | INTRAVENOUS | Status: DC | PRN

## 2020-03-08 NOTE — Discharge Instructions (Signed)
COVID-34 COVID-19 El COVID-19 es una infeccin respiratoria causada por un virus llamado coronavirus tipo 2 causante del sndrome respiratorio agudo grave (SARS-CoV-2). La enfermedad tambin se conoce como enfermedad por coronavirus o nuevo coronavirus. En algunas personas, el virus puede no ocasionar sntomas. En otras, puede producir una infeccin grave. La infeccin puede empeorar rpidamente y causar complicaciones, como:  Neumona o infeccin en los pulmones.  Sndrome de dificultad respiratoria aguda o SDRA. Es una afeccin que se caracteriza por la acumulacin de lquido en los pulmones, que impide que los pulmones se llenen de aire y pasen oxgeno a Herbalist.  Insuficiencia respiratoria aguda. Es una afeccin que se caracteriza porque no pasa suficiente oxgeno de los pulmones al cuerpo o porque el dixido de carbono no pasa de los pulmones hacia afuera del cuerpo.  Sepsis o choque sptico. Se trata de una reaccin grave del cuerpo ante una infeccin.  Problemas de coagulacin.  Infecciones secundarias debido a bacterias u hongos.  Falla de rganos. Ocurre cuando los rganos del cuerpo dejan de funcionar. El virus que causa el COVID-19 es contagioso. Esto significa que puede transmitirse de [place] persona a otra a travs de las gotitas de saliva de la tos y de los estornudos (secreciones respiratorias). Cules son las causas? Esta enfermedad es causada por un virus. Usted puede contagiarse con este virus:  Al inspirar las gotitas de una persona infectada. Las [employer] pueden diseminarse cuando una persona respira, habla, canta, tose o estornuda.  Al tocar algo, como una mesa o el picaportes de [place], que estuvo expuesto al virus (contaminado) y luego tocarse la boca, nariz o los ojos. Qu incrementa el riesgo? Riesgo de infeccin Es ms probable que se infecte con este virus si:  Se encuentra a Physiological scientist a 6 pies (2 metros) de Arts administrator con COVID-19.  Cuida o  vive con una persona infectada con COVID-19.  Pasa tiempo en espacios interiores repletos de gente o vive en viviendas compartidas. Riesgo de enfermedad grave Es ms probable que se enferme gravemente por el virus si:  Tiene 50aos o ms. Cuanto mayor sea su edad, mayor ser el riesgo de tener una enfermedad grave.  Vive en un hogar de ancianos o centro de atencin a Barrister's clerk.  Tiene cncer.  Tiene una enfermedad prolongada (crnica), como las siguientes: ? Enfermedad pulmonar crnica, que incluye la enfermedad pulmonar obstructiva crnica o asma. ? Una enfermedad crnica que disminuye la capacidad del cuerpo para combatir las infecciones (immunocomprometido). ? Enfermedad cardaca, que incluye insuficiencia cardaca, una afeccin que se caracteriza porque las arterias que llegan al corazn se Investment banker, corporate u obstruyen (arteriopata coronaria) o una enfermedad que provoca que el msculo cardaco se engrose, se debilite o endurezca (miocardiopata). ? Diabetes. ? Enfermedad renal crnica. ? Anemia drepanoctica, una enfermedad que se caracteriza porque los glbulos rojos tienen una forma anormal de "hoz". ? Enfermedad heptica.  Es obeso. Cules son los signos o sntomas? Los sntomas de esta afeccin pueden ser de leves a graves. Los sntomas pueden aparecer en el trmino de 2 a [address] despus de haber estado expuesto al virus. Incluyen los siguientes:  Fiebre o escalofros.  Tos.  Dificultad para respirar.  Dolores de [place], dolores en el cuerpo o dolores musculares.  Secrecin o congestin nasal.  Dolor de garganta.  Nueva prdida del sentido del gusto o del olfato. Algunas personas tambin pueden [employer], como nuseas, vmitos o diarrea. Es posible que otras personas no tengan sntomas de COVID-19.  Cmo se diagnostica? Esta afeccin se puede diagnosticar en funcin de lo siguiente:  Sus signos y sntomas, especialmente si: ? Vive en una zona  donde hay un brote de COVID-19. ? Viaj recientemente a una zona donde el virus es frecuente. ? Cuida o vive con Ardelia Mems persona a quien se le diagnostic COVID-19. ? [address] expuesto a una persona a la que se le diagnostic COVID-19.  Un examen fsico.  Anlisis de laboratorio que pueden incluir: ? Tomar una muestra de lquido de la parte posterior de la nariz y la garganta (lquido nasofarngeo), la nariz o la garganta, con un hisopo. ? Una muestra de mucosidad de los pulmones (esputo). ? Anlisis de Agency.  Los estudios de diagnstico por imgenes pueden incluir radiografas, exploracin por tomografa computarizada (TC) o ecografa. Cmo se trata? En este momento, no hay ningn medicamento para tratar el COVID-19. Los medicamentos para tratar otras enfermedades se usan a modo de ensayo para comprobar si son eficaces contra el COVID-19. El mdico le informar sobre las maneras de tratar los sntomas. En la [employer], la infeccin es leve y puede controlarse en el hogar con reposo, lquidos y medicamentos de [place]. El tratamiento para una infeccin grave suele realizarse en la unidad de cuidados intensivos (UCI) de un hospital. Puede incluir uno o ms de los siguientes. Estos tratamientos se administran hasta que los sntomas mejoran.  Recibir lquidos y [employer] a travs de una va intravenosa.  Oxgeno complementario. Para administrar oxgeno extra, se [place] un tubo en la [place]an [place], una mascarilla o una campana de oxgeno.  Colocarlo para que se recueste boca abajo (decbito prono). Esto facilita el ingreso de oxgeno a los pulmones.  Uso continuo de [place] de presin positiva de las vas areas (CPAP) o de presin positiva de las vas areas de dos niveles (BPAP). Este tratamiento utiliza una presin de aire leve para Theatre manager las vas respiratorias abiertas. Un tubo conectado a un motor administra oxgeno al cuerpo.  Respirador. Este tratamiento mueve el aire  dentro y fuera de los pulmones mediante el uso de un tubo que se coloca en la trquea.  Traqueostoma. En este procedimiento se hace un orificio en el cuello para insertar un tubo de respiracin.  Oxigenacin por membrana extracorprea (OMEC). En este procedimiento, los pulmones tienen la posibilidad de recuperarse al asumir las funciones del corazn y los pulmones. Suministra oxgeno al cuerpo y elimina el dixido de carbono. Siga estas instrucciones en su casa: Estilo de vida  Si est enfermo, qudese en su casa, excepto para obtener atencin mdica. El mdico le indicar cunto tiempo debe quedarse en casa. Llame al mdico antes de buscar atencin mdica.  Haga reposo en su casa como se lo haya indicado el mdico.  No consuma ningn producto que contenga nicotina o tabaco, como cigarrillos, cigarrillos electrnicos y tabaco de Higher education careers adviser. Si necesita ayuda para dejar de fumar, consulte al mdico.  Retome sus actividades normales segn lo indicado por el mdico. Pregntele al mdico qu actividades son seguras para usted. Instrucciones generales  Use los medicamentos de venta libre y los recetados solamente como se lo haya indicado el mdico.  Beba suficiente lquido como para Theatre manager la orina de color amarillo plido.  Concurra a todas las visitas de seguimiento como se lo haya indicado el mdico. Esto es importante. Cmo se evita?  No hay ninguna vacuna que ayude a prevenir la infeccin por COVID-19. Sin embargo, hay medidas que puede tomar para protegerse y Museum/gallery curator a  otras personas de [place] virus. Para protegerse:   No viaje a zonas donde el COVID-19 sea un riesgo. Las zonas donde se informa la presencia del COVID-19 cambian con frecuencia. Para identificar las zonas de alto riesgo y las restricciones de viaje, consulte el sitio web de viajes de Garment/textile technologist for [employer] and Prevention Librarian, academic) (Centros para el Control y la Prevencin de Arboriculturist):  FatFares.com.br  Si vive o debe viajar a una zona donde el COVID-19 es un riesgo, tome precauciones para evitar infecciones. ? Aljese de [place]. ? Lvese las manos frecuentemente con agua y [place]. Use desinfectante para manos con alcohol si no dispone de [place] y [place]. ? Evite tocarse la boca, la cara, los ojos o la [place]. ? Evite salir de su casa, siga las indicaciones de su estado y [place] autoridades sanitarias locales. ? Si debe salir de su casa, use un barbijo de tela o una mascarilla facial. Asegrese de que le cubra la nariz y la boca. ? Evite los espacios interiores repletos de gente. Mantenga una distancia de al menos 6 pies (2 metros) de Producer, television/film/video. ? Desinfecte los objetos y las superficies que se tocan con frecuencia todos [place]. Pueden incluir:  Encimeras y [place].  Picaportes e interruptores de luz.  Lavabos, fregaderos y grifos.  Aparatos electrnicos tales como telfonos, controles remotos, teclados, computadoras y tabletas. Cmo proteger a los dems: Si tiene sntomas de COVID-19, tome medidas para evitar que el virus se propague a Producer, television/film/video.  Si cree que tiene una infeccin por COVID-19, comunquese de inmediato con su mdico. Informe al equipo de atencin mdica que cree que puede tener una infeccin por el COVID-19.  Qudese en su casa. Salga de su casa solo para buscar atencin mdica. No utilice el transporte pblico.  No viaje mientras est enfermo.  Lvese las manos frecuentemente con agua y [place]. Usar desinfectante para manos con alcohol si no dispone de [place] y [place].  Mantngase alejado de quienes vivan con usted. Permita que los miembros de la familia sanos cuiden a los nios y las [place], si es posible. Si tiene que cuidar a los nios o las mascotas, lvese las manos con frecuencia y use un barbijo. Si es posible, permanezca en su habitacin, separado de los dems. Utilice un  bao diferente.  Asegrese de que todas las personas que viven en su casa se laven bien las manos y con frecuencia.  Tosa o estornude en un pauelo de papel o sobre su manga o codo. No tosa o estornude al aire ni se cubra la boca o la nariz con la [place].  Use un barbijo de tela o una mascarilla facial. Asegrese de que le cubra la nariz y la boca. Dnde buscar ms informacin  Centers for Disease Control and Prevention (Centros para el Control y la Prevencin de Arboriculturist): PurpleGadgets.be  World Health Organization (Organizacin Mundial de la Salud): https://www.castaneda.info/ Comunquese con un mdico si:  Vive o ha viajado a una zona donde el COVID-19 es un riesgo y tiene sntomas de infeccin.  Ha tenido contacto con alguien que tiene COVID-19 y usted tiene sntomas de infeccin. Solicite ayuda inmediatamente si:  Tiene dificultad para respirar.  Siente dolor u opresin en el pecho.  Experimenta confusin.  [place] uas de los dedos y los labios de color [place].  Tiene dificultad para despertarse.  Los sntomas empeoran. Estos sntomas pueden representar un problema grave que constituye Engineer, maintenance (IT). No espere  a ver si los sntomas desaparecen. Solicite atencin mdica de inmediato. Comunquese con el servicio de emergencias de su localidad (911 en los [place]). No conduzca por sus propios medios [employer] hospital. Informe al personal mdico de emergencias si cree que tiene COVID-19. Resumen  El COVID-19 es una infeccin respiratoria causada por un virus. Tambin se conoce como enfermedad por coronavirus o nuevo coronavirus. Puede causar infecciones graves, como neumona, sndrome de dificultad respiratoria aguda, insuficiencia respiratoria aguda o sepsis.  El virus que causa el COVID-19 es contagioso. Esto significa que puede transmitirse de [place] persona a otra a travs de las gotitas que se despiden al respirar, Electrical engineer,  cantar, toser y Brewing technologist.  Es ms probable que desarrolle una enfermedad grave si tiene 57 aos o ms, tiene el sistema inmunitario dbil, vive en un hogar de ancianos o tiene una enfermedad crnica.  No hay ningn medicamento para tratar el COVID-19. El mdico le informar sobre las maneras de tratar los sntomas.  Tome medidas para protegerse y Museum/gallery curator a los Location manager las infecciones. Lvese las manos con frecuencia y desinfecte los objetos y las superficies que se tocan con frecuencia todos [place]. Mantngase alejado de las personas que estn enfermas y use un barbijo si est enfermo. Esta informacin no tiene Marine scientist el consejo del mdico. Asegrese de hacerle al mdico cualquier pregunta que tenga. Document Revised: 04/10/2019 Document Reviewed: 08/03/2018 Elsevier Patient Education  [place]. What types of side effects do monoclonal antibody drugs cause?  Common side effects  In general, the more common side effects caused by monoclonal antibody drugs include: . Allergic reactions, such as hives or itching . Flu-like signs and symptoms, including chills, fatigue, fever, and muscle aches and pains . Nausea, vomiting . Diarrhea . Skin rashes . Low blood pressure   The CDC is recommending patients who receive monoclonal antibody treatments wait at least 90 days before being vaccinated.  Currently, there are no data on the safety and efficacy of mRNA COVID-19 vaccines in persons who received monoclonal antibodies or convalescent plasma as part of COVID-19 treatment. Based on the estimated half-life of such therapies as well as evidence suggesting that reinfection is uncommon in the 90 days after initial infection, vaccination should be deferred for at least 90 days, as a precautionary measure until additional information becomes available, to avoid interference of the antibody treatment with vaccine-induced immune responses.

## 2020-03-08 NOTE — Progress Notes (Addendum)
  Diagnosis: COVID-19  Physician: Dr. Joya Gaskins   Procedure: [facility] Med: casirivimab\imdevimab infusion - Provided patient with casirivimab\imdevimab fact sheet for patients, parents and caregivers prior to infusion.  Complications: No immediate complications noted. Patient did experience high blood pressure reading after transfusion 192/99, patient reports he did not take his BP medication this morning. Education was given to the patient to take his BP medication and 1 hour after taking the medication to assess his blood pressure with his blood pressure cuff and monitor at home. If BP still elevated then he should call his primary care doctor.   Discharge: Discharged home   Claudia Desanctis 03/08/2020

## 2020-03-18 NOTE — Congregational Nurse Program (Signed)
  Dept: [phone]   Congregational Nurse Program Note  Date of Encounter: 03/18/2020  Past Medical History: Past Medical History:  Diagnosis Date  . Cardiomyopathy ([place])    a. 07/2009 MV: EF 48%; b. 08/2019 Echo: EF 45-50%. Global HK. Mod LVH. Gr1 DD. Nl RV fxn. Nl PASP.  Marland Kitchen Chest pain    a. 07/2009 MV: EF 48%. No ischemia.  . Diabetes mellitus without complication ([place])   . HFmrEF (heart failure with mildy reduced ejection fraction) ([place])   . Hypertension     Encounter Details:  CNP Questionnaire - 03/18/20 1425      Questionnaire   Patient Status Not Applicable    Race Hispanic or Latino    Location [facility]   [place] Yes, have food insecurities    Housing/Utilities Yes, have permanent housing    Transportation No transportation needs    Interpersonal Safety Yes, feel physically and emotionally safe where you currently live    Medication Yes, have medication insecurities    Medical Provider No    Referrals Other    ED Visit Averted Not Applicable    Life-Saving Intervention Made Not Applicable           Patient coming in for help to call patient accounting bill to halt his monthly payment bills due to a recent hospitalization. Assisted in researching the  unemployment form for the patient. Assisted with filling out Covid-19 utility assistance application for his electricity bill.

## 2020-07-15 ENCOUNTER — Inpatient Hospital Stay
Admission: EM | Admit: 2020-07-15 | Discharge: 2020-07-25 | DRG: 286 | Disposition: A | Discharge status: Home / Self Care | Payer: BC Managed Care – PPO | Attending: Internal Medicine | Admitting: Internal Medicine

## 2020-07-15 ENCOUNTER — Other Ambulatory Visit: Payer: Self-pay

## 2020-07-15 ENCOUNTER — Emergency Department: Payer: BC Managed Care – PPO

## 2020-07-15 DIAGNOSIS — Z8616 Personal history of COVID-19: Secondary | ICD-10-CM

## 2020-07-15 DIAGNOSIS — R778 Other specified abnormalities of plasma proteins: Secondary | ICD-10-CM

## 2020-07-15 DIAGNOSIS — J9601 Acute respiratory failure with hypoxia: Secondary | ICD-10-CM | POA: Diagnosis not present

## 2020-07-15 DIAGNOSIS — J811 Chronic pulmonary edema: Secondary | ICD-10-CM | POA: Diagnosis not present

## 2020-07-15 DIAGNOSIS — N189 Chronic kidney disease, unspecified: Secondary | ICD-10-CM | POA: Diagnosis not present

## 2020-07-15 DIAGNOSIS — N2581 Secondary hyperparathyroidism of renal origin: Secondary | ICD-10-CM | POA: Diagnosis not present

## 2020-07-15 DIAGNOSIS — E785 Hyperlipidemia, unspecified: Secondary | ICD-10-CM | POA: Diagnosis present

## 2020-07-15 DIAGNOSIS — N171 Acute kidney failure with acute cortical necrosis: Secondary | ICD-10-CM | POA: Diagnosis not present

## 2020-07-15 DIAGNOSIS — I130 Hypertensive heart and chronic kidney disease with heart failure and stage 1 through stage 4 chronic kidney disease, or unspecified chronic kidney disease: Secondary | ICD-10-CM | POA: Diagnosis not present

## 2020-07-15 DIAGNOSIS — E876 Hypokalemia: Secondary | ICD-10-CM | POA: Diagnosis not present

## 2020-07-15 DIAGNOSIS — R0602 Shortness of breath: Secondary | ICD-10-CM

## 2020-07-15 DIAGNOSIS — Z79899 Other long term (current) drug therapy: Secondary | ICD-10-CM

## 2020-07-15 DIAGNOSIS — D631 Anemia in chronic kidney disease: Secondary | ICD-10-CM | POA: Diagnosis not present

## 2020-07-15 DIAGNOSIS — R059 Cough, unspecified: Secondary | ICD-10-CM | POA: Diagnosis not present

## 2020-07-15 DIAGNOSIS — R7989 Other specified abnormal findings of blood chemistry: Secondary | ICD-10-CM | POA: Diagnosis present

## 2020-07-15 DIAGNOSIS — I2489 Other forms of acute ischemic heart disease: Secondary | ICD-10-CM

## 2020-07-15 DIAGNOSIS — R45851 Suicidal ideations: Secondary | ICD-10-CM | POA: Diagnosis not present

## 2020-07-15 DIAGNOSIS — E1122 Type 2 diabetes mellitus with diabetic chronic kidney disease: Secondary | ICD-10-CM | POA: Diagnosis present

## 2020-07-15 DIAGNOSIS — I214 Non-ST elevation (NSTEMI) myocardial infarction: Secondary | ICD-10-CM

## 2020-07-15 DIAGNOSIS — I10 Essential (primary) hypertension: Secondary | ICD-10-CM | POA: Diagnosis present

## 2020-07-15 DIAGNOSIS — N179 Acute kidney failure, unspecified: Secondary | ICD-10-CM | POA: Diagnosis not present

## 2020-07-15 DIAGNOSIS — R079 Chest pain, unspecified: Secondary | ICD-10-CM | POA: Diagnosis not present

## 2020-07-15 DIAGNOSIS — Z9114 Patient's other noncompliance with medication regimen: Secondary | ICD-10-CM

## 2020-07-15 DIAGNOSIS — I428 Other cardiomyopathies: Secondary | ICD-10-CM | POA: Diagnosis present

## 2020-07-15 DIAGNOSIS — N1832 Chronic kidney disease, stage 3b: Secondary | ICD-10-CM | POA: Diagnosis not present

## 2020-07-15 DIAGNOSIS — E781 Pure hyperglyceridemia: Secondary | ICD-10-CM | POA: Diagnosis present

## 2020-07-15 DIAGNOSIS — Z841 Family history of disorders of kidney and ureter: Secondary | ICD-10-CM

## 2020-07-15 DIAGNOSIS — I110 Hypertensive heart disease with heart failure: Secondary | ICD-10-CM | POA: Diagnosis not present

## 2020-07-15 DIAGNOSIS — K5900 Constipation, unspecified: Secondary | ICD-10-CM | POA: Diagnosis present

## 2020-07-15 DIAGNOSIS — I25118 Atherosclerotic heart disease of native coronary artery with other forms of angina pectoris: Secondary | ICD-10-CM

## 2020-07-15 DIAGNOSIS — I2510 Atherosclerotic heart disease of native coronary artery without angina pectoris: Secondary | ICD-10-CM

## 2020-07-15 DIAGNOSIS — I272 Pulmonary hypertension, unspecified: Secondary | ICD-10-CM | POA: Diagnosis present

## 2020-07-15 DIAGNOSIS — R918 Other nonspecific abnormal finding of lung field: Secondary | ICD-10-CM | POA: Diagnosis not present

## 2020-07-15 DIAGNOSIS — N400 Enlarged prostate without lower urinary tract symptoms: Secondary | ICD-10-CM | POA: Diagnosis not present

## 2020-07-15 DIAGNOSIS — I5021 Acute systolic (congestive) heart failure: Secondary | ICD-10-CM | POA: Diagnosis not present

## 2020-07-15 DIAGNOSIS — I248 Other forms of acute ischemic heart disease: Secondary | ICD-10-CM | POA: Diagnosis not present

## 2020-07-15 DIAGNOSIS — N184 Chronic kidney disease, stage 4 (severe): Secondary | ICD-10-CM | POA: Diagnosis present

## 2020-07-15 DIAGNOSIS — I2584 Coronary atherosclerosis due to calcified coronary lesion: Secondary | ICD-10-CM | POA: Diagnosis present

## 2020-07-15 DIAGNOSIS — E1165 Type 2 diabetes mellitus with hyperglycemia: Secondary | ICD-10-CM | POA: Diagnosis present

## 2020-07-15 DIAGNOSIS — I152 Hypertension secondary to endocrine disorders: Secondary | ICD-10-CM | POA: Diagnosis present

## 2020-07-15 DIAGNOSIS — Z7982 Long term (current) use of aspirin: Secondary | ICD-10-CM

## 2020-07-15 DIAGNOSIS — E7800 Pure hypercholesterolemia, unspecified: Secondary | ICD-10-CM | POA: Diagnosis not present

## 2020-07-15 DIAGNOSIS — Z7952 Long term (current) use of systemic steroids: Secondary | ICD-10-CM

## 2020-07-15 DIAGNOSIS — N178 Other acute kidney failure: Secondary | ICD-10-CM | POA: Diagnosis not present

## 2020-07-15 DIAGNOSIS — Z8249 Family history of ischemic heart disease and other diseases of the circulatory system: Secondary | ICD-10-CM

## 2020-07-15 DIAGNOSIS — I517 Cardiomegaly: Secondary | ICD-10-CM | POA: Diagnosis not present

## 2020-07-15 DIAGNOSIS — E1129 Type 2 diabetes mellitus with other diabetic kidney complication: Secondary | ICD-10-CM | POA: Diagnosis present

## 2020-07-15 DIAGNOSIS — J189 Pneumonia, unspecified organism: Secondary | ICD-10-CM | POA: Diagnosis not present

## 2020-07-15 DIAGNOSIS — I5043 Acute on chronic combined systolic (congestive) and diastolic (congestive) heart failure: Secondary | ICD-10-CM | POA: Diagnosis present

## 2020-07-15 DIAGNOSIS — N170 Acute kidney failure with tubular necrosis: Secondary | ICD-10-CM

## 2020-07-15 DIAGNOSIS — F332 Major depressive disorder, recurrent severe without psychotic features: Secondary | ICD-10-CM

## 2020-07-15 DIAGNOSIS — I5023 Acute on chronic systolic (congestive) heart failure: Secondary | ICD-10-CM | POA: Diagnosis not present

## 2020-07-15 DIAGNOSIS — R0789 Other chest pain: Secondary | ICD-10-CM | POA: Diagnosis not present

## 2020-07-15 DIAGNOSIS — I160 Hypertensive urgency: Secondary | ICD-10-CM | POA: Diagnosis present

## 2020-07-15 DIAGNOSIS — Z20822 Contact with and (suspected) exposure to covid-19: Secondary | ICD-10-CM | POA: Diagnosis present

## 2020-07-15 DIAGNOSIS — Z794 Long term (current) use of insulin: Secondary | ICD-10-CM

## 2020-07-15 DIAGNOSIS — I25110 Atherosclerotic heart disease of native coronary artery with unstable angina pectoris: Secondary | ICD-10-CM | POA: Diagnosis not present

## 2020-07-15 DIAGNOSIS — Z818 Family history of other mental and behavioral disorders: Secondary | ICD-10-CM

## 2020-07-15 DIAGNOSIS — F419 Anxiety disorder, unspecified: Secondary | ICD-10-CM | POA: Diagnosis present

## 2020-07-15 HISTORY — DX: Other specified abnormalities of plasma proteins: R77.8

## 2020-07-15 HISTORY — DX: Acute on chronic systolic (congestive) heart failure: I50.23

## 2020-07-15 HISTORY — DX: Non-ST elevation (NSTEMI) myocardial infarction: I21.4

## 2020-07-15 LAB — COMPREHENSIVE METABOLIC PANEL
ALT: 15 U/L (ref 0–44)
AST: 18 U/L (ref 15–41)
Albumin: 3 g/dL — ABNORMAL LOW (ref 3.5–5.0)
Alkaline Phosphatase: 81 U/L (ref 38–126)
Anion gap: 8 (ref 5–15)
BUN: 42 mg/dL — ABNORMAL HIGH (ref 6–20)
CO2: 25 mmol/L (ref 22–32)
Calcium: 8.5 mg/dL — ABNORMAL LOW (ref 8.9–10.3)
Chloride: 108 mmol/L (ref 98–111)
Creatinine, Ser: 3 mg/dL — ABNORMAL HIGH (ref 0.61–1.24)
GFR, Estimated: 23 mL/min — ABNORMAL LOW (ref >60)
Glucose, Bld: 245 mg/dL — ABNORMAL HIGH (ref 70–99)
Potassium: 3.7 mmol/L (ref 3.5–5.1)
Sodium: 141 mmol/L (ref 135–145)
Total Bilirubin: 0.5 mg/dL (ref 0.3–1.2)
Total Protein: 6.3 g/dL — ABNORMAL LOW (ref 6.5–8.1)

## 2020-07-15 LAB — CBC WITH DIFFERENTIAL/PLATELET
Abs Immature Granulocytes: 0.01 10*3/uL (ref 0.00–0.07)
Basophils Absolute: 0 10*3/uL (ref 0.0–0.1)
Basophils Relative: 1 %
Eosinophils Absolute: 0.3 10*3/uL (ref 0.0–0.5)
Eosinophils Relative: 5 %
HCT: 34.6 % — ABNORMAL LOW (ref 39.0–52.0)
Hemoglobin: 11.9 g/dL — ABNORMAL LOW (ref 13.0–17.0)
Immature Granulocytes: 0 %
Lymphocytes Relative: 26 %
Lymphs Abs: 1.5 10*3/uL (ref 0.7–4.0)
MCH: 29.9 pg (ref 26.0–34.0)
MCHC: 34.4 g/dL (ref 30.0–36.0)
MCV: 86.9 fL (ref 80.0–100.0)
Monocytes Absolute: 0.4 10*3/uL (ref 0.1–1.0)
Monocytes Relative: 7 %
Neutro Abs: 3.7 10*3/uL (ref 1.7–7.7)
Neutrophils Relative %: 61 %
Platelets: 223 10*3/uL (ref 150–400)
RBC: 3.98 MIL/uL — ABNORMAL LOW (ref 4.22–5.81)
RDW: 11.9 % (ref 11.5–15.5)
WBC: 6 10*3/uL (ref 4.0–10.5)
nRBC: 0 % (ref 0.0–0.2)

## 2020-07-15 LAB — TROPONIN I (HIGH SENSITIVITY)
Troponin I (High Sensitivity): 124 ng/L (ref <18)
Troponin I (High Sensitivity): 127 ng/L (ref <18)
Troponin I (High Sensitivity): 176 ng/L (ref <18)
Troponin I (High Sensitivity): 190 ng/L (ref <18)
Troponin I (High Sensitivity): 200 ng/L (ref <18)

## 2020-07-15 LAB — CBG MONITORING, ED
Glucose-Capillary: 163 mg/dL — ABNORMAL HIGH (ref 70–99)
Glucose-Capillary: 231 mg/dL — ABNORMAL HIGH (ref 70–99)
Glucose-Capillary: 212 mg/dL — ABNORMAL HIGH (ref 70–99)

## 2020-07-15 LAB — BRAIN NATRIURETIC PEPTIDE: B Natriuretic Peptide: 834.1 pg/mL — ABNORMAL HIGH (ref 0.0–100.0)

## 2020-07-15 LAB — SARS CORONAVIRUS 2 BY RT PCR (HOSPITAL ORDER, PERFORMED IN ~~LOC~~ HOSPITAL LAB): SARS Coronavirus 2: NEGATIVE

## 2020-07-15 LAB — GLUCOSE, CAPILLARY: Glucose-Capillary: 134 mg/dL — ABNORMAL HIGH (ref 70–99)

## 2020-07-15 LAB — HEMOGLOBIN A1C
Hgb A1c MFr Bld: 9.7 % — ABNORMAL HIGH (ref 4.8–5.6)
Mean Plasma Glucose: 231.69 mg/dL

## 2020-07-15 LAB — LACTIC ACID, PLASMA: Lactic Acid, Venous: 1.2 mmol/L (ref 0.5–1.9)

## 2020-07-15 MED ORDER — LACTATED RINGERS IV BOLUS (SEPSIS)
1000.0000 mL | Freq: Once | INTRAVENOUS | Status: DC
  Administered 2020-07-15: 1000 mL via INTRAVENOUS

## 2020-07-15 MED ORDER — ASPIRIN 81 MG PO CHEW
324.0000 mg | CHEWABLE_TABLET | Freq: Once | ORAL | Status: AC
  Administered 2020-07-15: 324 mg via ORAL
  Filled 2020-07-15: qty 4

## 2020-07-15 MED ORDER — LACTATED RINGERS IV SOLN: INTRAVENOUS | Status: DC

## 2020-07-15 MED ORDER — FUROSEMIDE 10 MG/ML IJ SOLN
60.0000 mg | Freq: Once | INTRAMUSCULAR | Status: AC
  Administered 2020-07-15: 60 mg via INTRAVENOUS
  Filled 2020-07-15: qty 8

## 2020-07-15 MED ORDER — NITROGLYCERIN 0.4 MG SL SUBL
0.4000 mg | SUBLINGUAL_TABLET | SUBLINGUAL | Status: DC | PRN
  Administered 2020-07-15 – 2020-07-22 (×7): 0.4 mg via SUBLINGUAL
  Filled 2020-07-15 (×7): qty 1

## 2020-07-15 MED ORDER — ALBUTEROL SULFATE HFA 108 (90 BASE) MCG/ACT IN AERS
2.0000 | INHALATION_SPRAY | RESPIRATORY_TRACT | Status: DC | PRN
  Filled 2020-07-15: qty 6.7

## 2020-07-15 MED ORDER — BISOPROLOL FUMARATE 5 MG PO TABS
10.0000 mg | ORAL_TABLET | Freq: Every day | ORAL | Status: DC
  Administered 2020-07-15 – 2020-07-25 (×10): 10 mg via ORAL
  Filled 2020-07-15 (×11): qty 2

## 2020-07-15 MED ORDER — ACETAMINOPHEN 325 MG PO TABS
650.0000 mg | ORAL_TABLET | Freq: Four times a day (QID) | ORAL | Status: DC | PRN
  Administered 2020-07-19 – 2020-07-24 (×3): 650 mg via ORAL
  Filled 2020-07-15 (×3): qty 2

## 2020-07-15 MED ORDER — IPRATROPIUM-ALBUTEROL 0.5-2.5 (3) MG/3ML IN SOLN
3.0000 mL | Freq: Once | RESPIRATORY_TRACT | Status: AC
  Administered 2020-07-15: 3 mL via RESPIRATORY_TRACT
  Filled 2020-07-15: qty 3

## 2020-07-15 MED ORDER — AMLODIPINE BESYLATE 5 MG PO TABS
5.0000 mg | ORAL_TABLET | Freq: Every day | ORAL | Status: DC
  Administered 2020-07-16 – 2020-07-17 (×2): 5 mg via ORAL
  Filled 2020-07-15 (×2): qty 1

## 2020-07-15 MED ORDER — AMLODIPINE BESYLATE 5 MG PO TABS: 10.0000 mg | ORAL_TABLET | Freq: Every day | ORAL | Status: DC

## 2020-07-15 MED ORDER — INSULIN ASPART 100 UNIT/ML ~~LOC~~ SOLN
0.0000 [IU] | Freq: Every day | SUBCUTANEOUS | Status: DC
  Administered 2020-07-16 – 2020-07-18 (×3): 2 [IU] via SUBCUTANEOUS
  Administered 2020-07-19: 3 [IU] via SUBCUTANEOUS
  Administered 2020-07-20 – 2020-07-24 (×4): 2 [IU] via SUBCUTANEOUS
  Filled 2020-07-15 (×8): qty 1

## 2020-07-15 MED ORDER — HYDRALAZINE HCL 20 MG/ML IJ SOLN
5.0000 mg | INTRAMUSCULAR | Status: DC | PRN
  Administered 2020-07-15: 5 mg via INTRAVENOUS
  Filled 2020-07-15: qty 1

## 2020-07-15 MED ORDER — HYDRALAZINE HCL 50 MG PO TABS: 50.0000 mg | ORAL_TABLET | Freq: Three times a day (TID) | ORAL | Status: DC

## 2020-07-15 MED ORDER — DM-GUAIFENESIN ER 30-600 MG PO TB12
1.0000 | ORAL_TABLET | Freq: Two times a day (BID) | ORAL | Status: DC | PRN
  Administered 2020-07-20: 1 via ORAL
  Filled 2020-07-15: qty 1

## 2020-07-15 MED ORDER — ISOSORBIDE MONONITRATE ER 30 MG PO TB24
30.0000 mg | ORAL_TABLET | Freq: Every day | ORAL | Status: DC
  Administered 2020-07-15 – 2020-07-17 (×3): 30 mg via ORAL
  Filled 2020-07-15 (×3): qty 1

## 2020-07-15 MED ORDER — SODIUM CHLORIDE 0.9 % IV SOLN: 250.0000 mL | INTRAVENOUS | Status: DC | PRN

## 2020-07-15 MED ORDER — ROSUVASTATIN CALCIUM 10 MG PO TABS
10.0000 mg | ORAL_TABLET | Freq: Every day | ORAL | Status: DC
  Administered 2020-07-15 – 2020-07-16 (×2): 10 mg via ORAL
  Filled 2020-07-15 (×3): qty 1

## 2020-07-15 MED ORDER — ASPIRIN EC 81 MG PO TBEC
81.0000 mg | DELAYED_RELEASE_TABLET | Freq: Every day | ORAL | Status: DC
  Administered 2020-07-16 – 2020-07-25 (×10): 81 mg via ORAL
  Filled 2020-07-15 (×10): qty 1

## 2020-07-15 MED ORDER — FUROSEMIDE 10 MG/ML IJ SOLN
40.0000 mg | Freq: Two times a day (BID) | INTRAMUSCULAR | Status: DC
  Administered 2020-07-15 – 2020-07-17 (×4): 40 mg via INTRAVENOUS
  Filled 2020-07-15 (×4): qty 4

## 2020-07-15 MED ORDER — SODIUM CHLORIDE 0.9% FLUSH
3.0000 mL | Freq: Two times a day (BID) | INTRAVENOUS | Status: DC
  Administered 2020-07-15 – 2020-07-17 (×6): 3 mL via INTRAVENOUS

## 2020-07-15 MED ORDER — SODIUM CHLORIDE 0.9% FLUSH: 3.0000 mL | INTRAVENOUS | Status: DC | PRN

## 2020-07-15 MED ORDER — ONDANSETRON HCL 4 MG PO TABS
4.0000 mg | ORAL_TABLET | Freq: Four times a day (QID) | ORAL | Status: DC | PRN
  Administered 2020-07-20 (×2): 4 mg via ORAL
  Filled 2020-07-15 (×2): qty 1

## 2020-07-15 MED ORDER — INSULIN DETEMIR 100 UNIT/ML ~~LOC~~ SOLN
30.0000 [IU] | Freq: Two times a day (BID) | SUBCUTANEOUS | Status: DC
  Administered 2020-07-15 – 2020-07-17 (×6): 30 [IU] via SUBCUTANEOUS
  Filled 2020-07-15 (×9): qty 0.3

## 2020-07-15 MED ORDER — INSULIN ASPART 100 UNIT/ML ~~LOC~~ SOLN
0.0000 [IU] | Freq: Three times a day (TID) | SUBCUTANEOUS | Status: DC
  Administered 2020-07-15: 2 [IU] via SUBCUTANEOUS
  Administered 2020-07-15: 17:00:00 3 [IU] via SUBCUTANEOUS
  Administered 2020-07-16 (×2): 2 [IU] via SUBCUTANEOUS
  Administered 2020-07-16: 12:00:00 3 [IU] via SUBCUTANEOUS
  Administered 2020-07-17 (×2): 1 [IU] via SUBCUTANEOUS
  Administered 2020-07-17 – 2020-07-18 (×2): 5 [IU] via SUBCUTANEOUS
  Administered 2020-07-19: 7 [IU] via SUBCUTANEOUS
  Administered 2020-07-19: 5 [IU] via SUBCUTANEOUS
  Administered 2020-07-20 (×2): 2 [IU] via SUBCUTANEOUS
  Administered 2020-07-21 (×2): 5 [IU] via SUBCUTANEOUS
  Administered 2020-07-21 – 2020-07-22 (×2): 2 [IU] via SUBCUTANEOUS
  Administered 2020-07-22: 5 [IU] via SUBCUTANEOUS
  Administered 2020-07-23 (×2): 2 [IU] via SUBCUTANEOUS
  Administered 2020-07-23: 7 [IU] via SUBCUTANEOUS
  Administered 2020-07-24: 2 [IU] via SUBCUTANEOUS
  Administered 2020-07-24: 5 [IU] via SUBCUTANEOUS
  Administered 2020-07-25: 3 [IU] via SUBCUTANEOUS
  Filled 2020-07-15 (×26): qty 1

## 2020-07-15 MED ORDER — ENOXAPARIN SODIUM 30 MG/0.3ML ~~LOC~~ SOLN
30.0000 mg | SUBCUTANEOUS | Status: DC
  Administered 2020-07-15: 30 mg via SUBCUTANEOUS
  Filled 2020-07-15 (×2): qty 0.3

## 2020-07-15 NOTE — ED Notes (Signed)
Mansy, MD sent secure message informing him of pts Troponin of 190 and trending slightly up. Pt states CP has improved and is in NAD at this time. Awaiting further orders. Will continue to monitor.

## 2020-07-15 NOTE — H&P (Signed)
History and Physical    Jimmy Olson [id number] DOB: [birth year] DOA: 07/15/2020  Referring MD/NP/PA:   PCP: [facility] [place]   Patient coming from:  The patient is coming from home.  At baseline, pt is independent for most of ADL.        Chief Complaint: Shortness of breath  HPI: Jimmy Olson is a 60 y.o. male with medical history significant of sCHF with EF of 45 to 50%, hypertension, hyperlipidemia, diabetes mellitus, COVID-19 infection 4 months ago, CKD 3B, who presents with shortness of breath.  Patient states that he has been having shortness of breath for more than 2 days, which has been progressively worsening.  Patient has dry cough, chest tightness, no fever or chills.  Patient has orthopnea.  Denies nausea vomiting, diarrhea, abdominal pain, symptoms of UTI or unilateral weakness.   ED Course: pt was found to have BNP 835, worsening renal function, troponin level 124, 127, negative Covid PCR, lactic acid 1.2, temperature normal, blood pressure 150/84, heart rate 106, 94, RR 24, oxygen saturation 90% on room air, which improved to 99% on 2 L oxygen.  Chest x-ray showed interstitial pulmonary edema with possible small right pleural effusion.  Patient is admitted to progressive bed as inpatient  Review of Systems:   General: no fevers, chills, no body weight gain, has fatigue HEENT: no blurry vision, hearing changes or sore throat Respiratory: has dyspnea, coughing, no wheezing CV: has chest tighness, no palpitations GI: no nausea, vomiting, abdominal pain, diarrhea, constipation GU: no dysuria, burning on urination, increased urinary frequency, hematuria  Ext: has trace leg edema Neuro: no unilateral weakness, numbness, or tingling, no vision change or hearing loss Skin: no rash, no skin tear. MSK: No muscle spasm, no deformity, no limitation of range of movement in spin Heme: No easy bruising.  Travel history: No recent long distant  travel.  Allergy: No Known Allergies  Past Medical History:  Diagnosis Date  . Cardiomyopathy ([place])    a. 07/2009 MV: EF 48%; b. 08/2019 Echo: EF 45-50%. Global HK. Mod LVH. Gr1 DD. Nl RV fxn. Nl PASP.  Marland Kitchen Chest pain    a. 07/2009 MV: EF 48%. No ischemia.  . Diabetes mellitus without complication ([place])   . HFmrEF (heart failure with mildy reduced ejection fraction) ([place])   . Hypertension     Past Surgical History:  Procedure Laterality Date  . APPENDECTOMY      Social History:  reports that he has never smoked. He has never used smokeless tobacco. He reports that he does not drink alcohol and does not use drugs.  Family History:  Family History  Problem Relation Age of Onset  . Kidney failure Mother        died @ 51  . Heart failure Mother   . Other Father        he never knew his father     Prior to Admission medications   Medication Sig Start Date End Date Taking? Authorizing Provider  amLODipine (NORVASC) 10 MG tablet Take 1 tablet (10 mg total) by mouth daily. 03/06/20   Harvest Dark, MD  aspirin EC 81 MG tablet Take 81 mg by mouth daily.    [provider]  bisoprolol (ZEBETA) 10 MG tablet Take 1 tablet (10 mg total) by mouth daily. 09/16/19   Nicole Kindred A, DO  guaiFENesin-codeine 100-10 MG/5ML syrup Take 5 mLs by mouth every 6 (six) hours as needed for cough. 03/06/20  Harvest Dark, MD  guaiFENesin-dextromethorphan (ROBITUSSIN DM) 100-10 MG/5ML syrup Take 10 mLs by mouth every 6 (six) hours. 09/15/19   Nicole Kindred A, DO  hydrALAZINE (APRESOLINE) 50 MG tablet Take 1 tablet (50 mg total) by mouth 3 (three) times daily. 09/15/19   Nicole Kindred A, DO  insulin detemir (LEVEMIR) 100 UNIT/ML FlexPen Inject 20 Units into the skin 2 (two) times daily. 09/15/19   Nicole Kindred A, DO  insulin detemir (LEVEMIR) 100 UNIT/ML FlexPen Inject 20 Units into the skin 2 (two) times daily. 09/15/19   Ezekiel Slocumb, DO  insulin lispro (HUMALOG) 100 UNIT/ML  KwikPen Inject 0.03 mLs (3 Units total) into the skin 3 (three) times daily with meals. Do not take if you do not eat more than 50% of meal 09/15/19   Nicole Kindred A, DO  isosorbide mononitrate (IMDUR) 30 MG 24 hr tablet Take 1 tablet (30 mg total) by mouth daily. 09/16/19   Nicole Kindred A, DO  lisinopril (ZESTRIL) 30 MG tablet Take 1 tablet (30 mg total) by mouth daily. 03/06/20   Harvest Dark, MD  nitroGLYCERIN (NITROSTAT) 0.4 MG SL tablet Place 1 tablet (0.4 mg total) under the tongue every 5 (five) minutes as needed for chest pain. 09/15/19   Nicole Kindred A, DO  ondansetron (ZOFRAN) 4 MG tablet Take 1 tablet (4 mg total) by mouth daily as needed for nausea or vomiting. 03/06/20   Harvest Dark, MD  predniSONE (DELTASONE) 10 MG tablet Take 1 tablet (10 mg total) by mouth daily. Day 1-3: take 4 tablets PO daily Day 4-6: take 3 tablets PO daily Day 7-9: take 2 tablets PO daily Day 10-12: take 1 tablet PO daily 03/06/20   Harvest Dark, MD  rosuvastatin (CRESTOR) 10 MG tablet Take 10 mg by mouth daily.    [provider]    Physical Exam: Vitals:   07/15/20 1530 07/15/20 1545 07/15/20 1600 07/15/20 1615  BP: (!) 176/98  (!) 167/98   Pulse: 73 71 70 72  Resp: '17 16 15 '$ (!) 21  Temp:      TempSrc:      SpO2: 96% 100% 100% 100%  Weight:      Height:       General: Not in acute distress HEENT:       Eyes: PERRL, EOMI, no scleral icterus.       ENT: No discharge from the ears and nose, no pharynx injection, no tonsillar enlargement.        Neck: positive JVD, no bruit, no mass felt. Heme: No neck lymph node enlargement. Cardiac: S1/S2, RRR, No murmurs, No gallops or rubs. Respiratory: has rales bilaterally GI: Soft, nondistended, nontender, no rebound pain, no organomegaly, BS present. GU: No hematuria Ext: has trace leg edema bilaterally. 1+DP/PT pulse bilaterally. Musculoskeletal: No joint deformities, No joint redness or warmth, no limitation of ROM in  spin. Skin: No rashes.  Neuro: Alert, oriented X3, cranial nerves II-XII grossly intact, moves all extremities normally.  Psych: Patient is not psychotic, no suicidal or hemocidal ideation.  Labs on Admission: I have personally reviewed following labs and imaging studies  CBC: Recent Labs  Lab 07/15/20 0359  WBC 6.0  NEUTROABS 3.7  HGB 11.9*  HCT 34.6*  MCV 86.9  PLT [id number]   Basic Metabolic Panel: Recent Labs  Lab 07/15/20 0359  NA 141  K 3.7  CL 108  CO2 25  GLUCOSE 245*  BUN 42*  CREATININE 3.00*  CALCIUM 8.5*   GFR: Estimated  Creatinine Clearance: 23.6 mL/min (A) (by C-G formula based on SCr of 3 mg/dL (H)). Liver Function Tests: Recent Labs  Lab 07/15/20 0359  AST 18  ALT 15  ALKPHOS 81  BILITOT 0.5  PROT 6.3*  ALBUMIN 3.0*   No results for input(s): LIPASE, AMYLASE in the last 168 hours. No results for input(s): AMMONIA in the last 168 hours. Coagulation Profile: No results for input(s): INR, PROTIME in the last 168 hours. Cardiac Enzymes: No results for input(s): CKTOTAL, CKMB, CKMBINDEX, TROPONINI in the last 168 hours. BNP (last 3 results) No results for input(s): PROBNP in the last 8760 hours. HbA1C: Recent Labs    07/15/20 1124  HGBA1C 9.7*   CBG: Recent Labs  Lab 07/15/20 1123 07/15/20 1624  GLUCAP 163* 231*   Lipid Profile: No results for input(s): CHOL, HDL, LDLCALC, TRIG, CHOLHDL, LDLDIRECT in the last 72 hours. Thyroid Function Tests: No results for input(s): TSH, T4TOTAL, FREET4, T3FREE, THYROIDAB in the last 72 hours. Anemia Panel: No results for input(s): VITAMINB12, FOLATE, FERRITIN, TIBC, IRON, RETICCTPCT in the last 72 hours. Urine analysis: No results found for: COLORURINE, APPEARANCEUR, LABSPEC, PHURINE, GLUCOSEU, HGBUR, BILIRUBINUR, KETONESUR, PROTEINUR, UROBILINOGEN, NITRITE, LEUKOCYTESUR Sepsis Labs: '@LABRCNTIP'$ (procalcitonin:4,lacticidven:4) ) Recent Results (from the past 240 hour(s))  SARS Coronavirus 2 by RT PCR  (hospital order, performed in [facility] hospital lab) Nasopharyngeal Nasopharyngeal Swab     Status: None   Collection Time: 07/15/20  3:59 AM   Specimen: Nasopharyngeal Swab  Result Value Ref Range Status   SARS Coronavirus 2 NEGATIVE NEGATIVE Final    Comment: (NOTE) SARS-CoV-2 target nucleic acids are NOT DETECTED.  The SARS-CoV-2 RNA is generally detectable in upper and lower respiratory specimens during the acute phase of infection. The lowest concentration of SARS-CoV-2 viral copies this assay can detect is 250 copies / mL. A negative result does not preclude SARS-CoV-2 infection and should not be used as the sole basis for treatment or other patient management decisions.  A negative result may occur with improper specimen collection / handling, submission of specimen other than nasopharyngeal swab, presence of viral mutation(s) within the areas targeted by this assay, and inadequate number of viral copies (<250 copies / mL). A negative result must be combined with clinical observations, patient history, and epidemiological information.  Fact Sheet for Patients:   StrictlyIdeas.no  Fact Sheet for Healthcare Providers: BankingDealers.co.za  This test is not yet approved or  cleared by the [place] FDA and has been authorized for detection and/or diagnosis of SARS-CoV-2 by FDA under an Emergency Use Authorization (EUA).  This EUA will remain in effect (meaning this test can be used) for the duration of the COVID-19 declaration under Section 564(b)(1) of the Act, 21 U.S.C. section 360bbb-3(b)(1), unless the authorization is terminated or revoked sooner.  Performed at [facility], [address] [place], [place] [address]   Culture, blood (single)     Status: None (Preliminary result)   Collection Time: 07/15/20  3:59 AM   Specimen: BLOOD  Result Value Ref Range Status   Specimen Description BLOOD RIGHT FA   Final   Special Requests   Final    BOTTLES DRAWN AEROBIC AND ANAEROBIC Blood Culture results may not be optimal due to an excessive volume of blood received in culture bottles   Culture   Final    NO GROWTH <12 HOURS Performed at [facility], [address], [place], [place] [address]    Report Status PENDING  Incomplete  Radiological Exams on Admission: DG Chest Portable 1 View  Result Date: 07/15/2020 CLINICAL DATA:  60 year old male with shortness of breath, cough for 2 days. 90-92% on room air. EXAM: PORTABLE CHEST 1 VIEW COMPARISON:  Chest radiographs 03/06/2020 and earlier. FINDINGS: Portable AP upright view at 0345 hours. Lower lung volumes. Extent rated cardiac size. Other mediastinal contours are within normal limits. Visualized tracheal air column is within normal limits. Diffuse increased pulmonary interstitial opacity, fairly symmetric and most pronounced at the lung bases. Additional hazy opacity at the right lung base. Evidence of trace fluid in the right minor fissure. No pneumothorax. No acute osseous abnormality identified. Negative visible bowel gas pattern. IMPRESSION: Lower lung volumes with diffuse bilateral increased pulmonary interstitial opacity. Favor acute pulmonary edema, with suspected small volume pleural fluid on the right. Electronically Signed   By: Genevie Ann M.D.   On: 07/15/2020 04:02     EKG: I have personally reviewed.  Sinus rhythm, QTC 446, LAE, mild T wave inversion in V5-V6  Assessment/Plan Principal Problem:   Acute on chronic systolic CHF (congestive heart failure) (HCC) Active Problems:   Essential hypertension   Elevated troponin   Type II diabetes mellitus with renal manifestations (HCC)   Acute renal failure superimposed on stage 3b chronic kidney disease (HCC)   HLD (hyperlipidemia)   Acute on chronic systolic CHF (congestive heart failure) ([place]): Patient has positive JVD, elevated BNP 834, interstitial pulmonary edema by chest  x-ray, clinically consistent with CHF exacerbation.  2D echo on 09/11/2019 showed EF of 45-50% with grade 2 diastolic dysfunction.  Dr. Clayborn Bigness of cardiology is consulted  -Will admit to progressive unit as inpatient -Lasix 40 mg bid by IV (patient received 60 mg of IV Lasix in ED) -2d echo -Daily weights -strict I/O's -Low salt diet -Fluid restriction -Obtain REDs Vest reading  Elevated troponin: Troponin 124, 127, 176.  Possibly due to demand ischemia.  Dr. Clayborn Bigness of cardiology is consulted -Aspirin, Crestor, Imdur -prn NTG -Trend troponin -check A1c, FLP -Follow-up 2D echo  Essential hypertension: Patient is taking lisinopril -Will hold lisinopril due to worsening renal function -Start amlodipine 5 mg daily, Zebeta 10 mg daily -IV hydralazine as needed -Patient is on IV Lasix  Type II diabetes mellitus with renal manifestations [facility]): Recent A1c 9.8, poorly controlled.  Patient is taking Humalog and Levemir at home -Sliding scale insulin -Decrease Levemir dose from 45 to 30 units twice daily  Acute renal failure superimposed on stage 3b chronic kidney disease [facility]): Recent baseline creatinine 1.9-2.0.  Her creatinine is 3.0, BUN 42, likely due to cardiorenal syndrome and also due to continuation of her lisinopril -Hold lisinopril -Monitor renal function closely  HLD (hyperlipidemia) -Crestor     DVT ppx: SQ Lovenox Code Status: Full code Family Communication: not done, no family member is at bed side.  Disposition Plan:  Anticipate discharge back to previous environment Consults called: Code for cardiology Admission status and Level of care: Progressive Cardiac as inpt       Status is: Inpatient  Remains inpatient appropriate because:Inpatient level of care appropriate due to severity of illness   Dispo: The patient is from: Home              Anticipated d/c is to: Home              Anticipated d/c date is: 2 days              Patient currently is not  medically stable to  d/c.   Difficult to place patient No          Date of Service 07/15/2020    [facility]   If 7PM-7AM, please contact night-coverage [email or web address] 07/15/2020, 4:59 PM

## 2020-07-15 NOTE — ED Provider Notes (Signed)
[facility] Emergency Department Provider Note ____________________________________________   Event Date/Time   First MD Initiated Contact with Patient 07/15/20 [id number]     (approximate)  I have reviewed the triage vital signs and the nursing notes.  HISTORY  Chief Complaint Shortness of Breath   HPI Jimmy Olson is a 60 y.o. malewho presents to the ED for evaluation of   Chart review indicates CHF with mild EF reduction, no diuretics noted on medication list.  History of DM on insulin, HTN, HLD.  Patient presents to the ED with 3 days of worsening shortness of breath and nonproductive cough.  Reports awakening from sleep tonight with worsening shortness of breath and cough with associated orthopnea.  Denies significant lower extremity swelling.  Denies fever, syncope, abdominal pain, emesis, diarrhea.  Does report substernal chest tightness since he awoke this morning, 5/10 intensity, aching and nonradiating.   Past Medical History:  Diagnosis Date  . Cardiomyopathy ([place])    a. 07/2009 MV: EF 48%; b. 08/2019 Echo: EF 45-50%. Global HK. Mod LVH. Gr1 DD. Nl RV fxn. Nl PASP.  Marland Kitchen Chest pain    a. 07/2009 MV: EF 48%. No ischemia.  . Diabetes mellitus without complication ([place])   . HFmrEF (heart failure with mildy reduced ejection fraction) ([place])   . Hypertension     Patient Active Problem List   Diagnosis Date Noted  . HFrEF (heart failure with reduced ejection fraction) ([place])   . Essential hypertension   . Acute on chronic respiratory failure with hypoxia ([place]) 09/13/2019  . CAP (community acquired pneumonia) 09/11/2019  . Acute respiratory failure with hypercapnia ([place]) 09/11/2019  . Accelerated hypertension 09/11/2019  . Type 2 diabetes mellitus with hyperlipidemia ([place]) 09/11/2019  . Type 2 diabetes mellitus with other specified complication ([place]) [id number]  . AKI (acute kidney injury) ([place]) 09/11/2019    History reviewed. No pertinent surgical  history.  Prior to Admission medications   Medication Sig Start Date End Date Taking? Authorizing Provider  amLODipine (NORVASC) 10 MG tablet Take 1 tablet (10 mg total) by mouth daily. 03/06/20   Harvest Dark, MD  aspirin EC 81 MG tablet Take 81 mg by mouth daily.    [provider]  bisoprolol (ZEBETA) 10 MG tablet Take 1 tablet (10 mg total) by mouth daily. 09/16/19   Nicole Kindred A, DO  guaiFENesin-codeine 100-10 MG/5ML syrup Take 5 mLs by mouth every 6 (six) hours as needed for cough. 03/06/20   Harvest Dark, MD  guaiFENesin-dextromethorphan (ROBITUSSIN DM) 100-10 MG/5ML syrup Take 10 mLs by mouth every 6 (six) hours. 09/15/19   Nicole Kindred A, DO  hydrALAZINE (APRESOLINE) 50 MG tablet Take 1 tablet (50 mg total) by mouth 3 (three) times daily. 09/15/19   Nicole Kindred A, DO  insulin detemir (LEVEMIR) 100 UNIT/ML FlexPen Inject 20 Units into the skin 2 (two) times daily. 09/15/19   Nicole Kindred A, DO  insulin detemir (LEVEMIR) 100 UNIT/ML FlexPen Inject 20 Units into the skin 2 (two) times daily. 09/15/19   Ezekiel Slocumb, DO  insulin lispro (HUMALOG) 100 UNIT/ML KwikPen Inject 0.03 mLs (3 Units total) into the skin 3 (three) times daily with meals. Do not take if you do not eat more than 50% of meal 09/15/19   Nicole Kindred A, DO  isosorbide mononitrate (IMDUR) 30 MG 24 hr tablet Take 1 tablet (30 mg total) by mouth daily. 09/16/19   Nicole Kindred A, DO  lisinopril (ZESTRIL) 30 MG tablet Take 1  tablet (30 mg total) by mouth daily. 03/06/20   Harvest Dark, MD  nitroGLYCERIN (NITROSTAT) 0.4 MG SL tablet Place 1 tablet (0.4 mg total) under the tongue every 5 (five) minutes as needed for chest pain. 09/15/19   Nicole Kindred A, DO  ondansetron (ZOFRAN) 4 MG tablet Take 1 tablet (4 mg total) by mouth daily as needed for nausea or vomiting. 03/06/20   Harvest Dark, MD  predniSONE (DELTASONE) 10 MG tablet Take 1 tablet (10 mg total) by mouth daily. Day 1-3:  take 4 tablets PO daily Day 4-6: take 3 tablets PO daily Day 7-9: take 2 tablets PO daily Day 10-12: take 1 tablet PO daily 03/06/20   Harvest Dark, MD  rosuvastatin (CRESTOR) 10 MG tablet Take 10 mg by mouth daily.    [provider]    Allergies Patient has no known allergies.  Family History  Problem Relation Age of Onset  . Kidney failure Mother        died @ 57  . Heart failure Mother   . Other Father        he never knew his father    Social History Social History   Tobacco Use  . Smoking status: Never Smoker  . Smokeless tobacco: Never Used  Substance Use Topics  . Alcohol use: Never  . Drug use: Never    Review of Systems  Constitutional: No fever/chills Eyes: No visual changes. ENT: No sore throat. Cardiovascular: Positive for chest pain. Respiratory: Positive for cough, shortness of breath and orthopnea. Gastrointestinal: No abdominal pain.  No nausea, no vomiting.  No diarrhea.  No constipation. Genitourinary: Negative for dysuria. Musculoskeletal: Negative for back pain. Skin: Negative for rash. Neurological: Negative for headaches, focal weakness or numbness.  ____________________________________________   PHYSICAL EXAM:  VITAL SIGNS: Vitals:   07/15/20 0400 07/15/20 0401  BP: (!) 170/104   Pulse: 92 89  Resp: 12 17  Temp:    SpO2: 92% 90%     Constitutional: Alert and oriented.  Sitting upright in bed, tachypneic and dyspneic.. Eyes: Conjunctivae are normal. PERRL. EOMI. Head: Atraumatic. Nose: No congestion/rhinnorhea. Mouth/Throat: Mucous membranes are moist.  Oropharynx non-erythematous. Neck: No stridor. No cervical spine tenderness to palpation. Cardiovascular: Normal rate, regular rhythm. Grossly normal heart sounds.  Good peripheral circulation. Respiratory: Tachypneic to the mid 20s.  Bibasilar crackles, clearing superiorly.  No wheezes. Gastrointestinal: Soft , nondistended, nontender to palpation. No CVA  tenderness. Musculoskeletal: No lower extremity tenderness.   No joint effusions. No signs of acute trauma. Mild pitting edema to bilateral lower extremities.  Symmetrically without overlying skin changes Neurologic:  Normal speech and language. No gross focal neurologic deficits are appreciated. No gait instability noted. Skin:  Skin is warm, dry and intact. No rash noted. Psychiatric: Mood and affect are normal. Speech and behavior are normal.  ____________________________________________   LABS (all labs ordered are listed, but only abnormal results are displayed)  Labs Reviewed  CBC WITH DIFFERENTIAL/PLATELET - Abnormal; Notable for the following components:      Result Value   RBC 3.98 (*)    Hemoglobin 11.9 (*)    HCT 34.6 (*)    All other components within normal limits  COMPREHENSIVE METABOLIC PANEL - Abnormal; Notable for the following components:   Glucose, Bld 245 (*)    BUN 42 (*)    Creatinine, Ser 3.00 (*)    Calcium 8.5 (*)    Total Protein 6.3 (*)    Albumin 3.0 (*)  GFR, Estimated 23 (*)    All other components within normal limits  BRAIN NATRIURETIC PEPTIDE - Abnormal; Notable for the following components:   B Natriuretic Peptide 834.1 (*)    All other components within normal limits  TROPONIN I (HIGH SENSITIVITY) - Abnormal; Notable for the following components:   Troponin I (High Sensitivity) 124 (*)    All other components within normal limits  SARS CORONAVIRUS 2 BY RT PCR (HOSPITAL ORDER, [facility])  CULTURE, BLOOD (SINGLE)  LACTIC ACID, PLASMA  POC SARS CORONAVIRUS 2 AG -  ED  TROPONIN I (HIGH SENSITIVITY)   ____________________________________________  12 Lead EKG  Sinus rhythm, rate of 93 bpm.  Normal axis and intervals.  Inferior and lateral T wave inversions.  No further evidence of acute ischemia.  These inversions are new compared to EKG from 02/2020 ____________________________________________  RADIOLOGY  ED  MD interpretation: 1 view CXR reviewed by me with cardiomegaly, pulmonary vascular congestion and small right-sided pleural effusion  Official radiology report(s): DG Chest Portable 1 View  Result Date: 07/15/2020 CLINICAL DATA:  60 year old male with shortness of breath, cough for 2 days. 90-92% on room air. EXAM: PORTABLE CHEST 1 VIEW COMPARISON:  Chest radiographs 03/06/2020 and earlier. FINDINGS: Portable AP upright view at 0345 hours. Lower lung volumes. Extent rated cardiac size. Other mediastinal contours are within normal limits. Visualized tracheal air column is within normal limits. Diffuse increased pulmonary interstitial opacity, fairly symmetric and most pronounced at the lung bases. Additional hazy opacity at the right lung base. Evidence of trace fluid in the right minor fissure. No pneumothorax. No acute osseous abnormality identified. Negative visible bowel gas pattern. IMPRESSION: Lower lung volumes with diffuse bilateral increased pulmonary interstitial opacity. Favor acute pulmonary edema, with suspected small volume pleural fluid on the right. Electronically Signed   By: Genevie Ann M.D.   On: 07/15/2020 04:02    ____________________________________________   PROCEDURES and INTERVENTIONS  Procedure(s) performed (including Critical Care):  .1-3 Lead EKG Interpretation Performed by: Vladimir Crofts, MD Authorized by: Vladimir Crofts, MD     Interpretation: normal     ECG rate:  90   ECG rate assessment: normal     Rhythm: sinus rhythm     Ectopy: none     Conduction: normal      Medications  nitroGLYCERIN (NITROSTAT) SL tablet 0.4 mg (has no administration in time range)  furosemide (LASIX) injection 60 mg (has no administration in time range)  aspirin chewable tablet 324 mg (has no administration in time range)  ipratropium-albuterol (DUONEB) 0.5-2.5 (3) MG/3ML nebulizer solution 3 mL (3 mLs Nebulization Given 07/15/20 0417)     ____________________________________________   MDM / ED COURSE   60 year old male with known reduced ejection fraction presents to the ED with evidence of a CHF exacerbation requiring medical admission.  Saturations right at 90% on room air, hypertensive without fever.  Exam with tachypnea, dyspnea and some evidence of volume overload with mildly edematous ankles and dependent bibasilar crackles.  Blood work shows no evidence of sepsis, but does have significantly elevated BNP and a CXR that is congested with a small pleural effusion, ultimately consistent with CHF exacerbation.  Covid negative on PSR testing.  Troponin is slightly elevated and EKG shows no STEMI criteria.  This is likely secondary to his respiratory pathology.  We will cover him with full dose of aspirin, provide nitroglycerin due to his hypertension and respiratory effort, initiate diuresis with Lasix, and admit the patient  to hospitalist medicine.   Clinical Course as of 07/15/20 0523  Thu Jul 15, 2020  0419 CXR reviewed and additional history in the chart reviewed.  I'm concerned about CHF plus / minus COVID-19.  We will discontinue his fluid bolus at this time because I am concerned that would worsen his clinical situation.  We will provide nitro due to his chest pain, hypertension and likely CHF exacerbation. [DS]    Clinical Course User Index [DS] Vladimir Crofts, MD    ____________________________________________   FINAL CLINICAL IMPRESSION(S) / ED DIAGNOSES  Final diagnoses:  Acute on chronic systolic congestive heart failure ([place])  SOB (shortness of breath)  Elevated troponin     ED Discharge Orders    None       Jimmy Olson   Note:  This document was prepared using Dragon voice recognition software and may include unintentional dictation errors.   Vladimir Crofts, MD 07/15/20 [phone]

## 2020-07-15 NOTE — Consult Note (Addendum)
Pt ha sbeen seen by [facility] in the past 08/2019 There is no evidence that [facility] has ever been part of his care team I recommend consulting [facility] Thank, Alioune Hodgkin

## 2020-07-15 NOTE — ED Notes (Signed)
fsbs 231.  Pt eating  Dinner tray now

## 2020-07-15 NOTE — Progress Notes (Signed)
Pt arrived unit form the ED at 2140 alert and oriented. Admission assessment completed with an interpreter. Pt responded that 2 weeks ago, he has thoughts about killing himself " because his primary care doctor told him his potassium was high and all the medical problems he is currently encountering" he stated that he wanted to crash his car and end it all. He denied any suicidal thoughts at this time. Provider notified

## 2020-07-15 NOTE — ED Notes (Signed)
Took over care of pt. Pt states pain has improved. Pt placed on monitor. Pt in NAD at this time.  VSS. Awaiting further orders. Will continue to monitor.

## 2020-07-15 NOTE — Plan of Care (Addendum)
1:1 sitter ordered per provider.   Problem: Clinical Measurements: Goal: Remain free from any harm during hospitalization Outcome: Progressing   Problem: Nutrition: Goal: Adequate fluids and nutrition will be maintained Outcome: Progressing   Problem: Coping: Goal: Ability to disclose and discuss thoughts of suicide and self-harm will improve Outcome: Progressing   Problem: Sleep Hygiene: Goal: Ability to obtain adequate restful sleep will improve Outcome: Progressing   Problem: Self Esteem: Goal: Ability to verbalize positive feeling about self will improve Outcome: Progressing

## 2020-07-15 NOTE — ED Triage Notes (Signed)
Pt arrives via ems from home. Son called out for SOB. EMS report cough x 2 days. RA sat inititialy 90-92% pt placed on 10 L NRB. A&o x 4, able to speak in full sentences. MD present on arrival for assessment.  97.8 temp

## 2020-07-16 ENCOUNTER — Inpatient Hospital Stay (HOSPITAL_COMMUNITY)
Admit: 2020-07-16 | Discharge: 2020-07-16 | Disposition: A | Discharge status: Home / Self Care | Payer: BC Managed Care – PPO | Attending: Internal Medicine | Admitting: Internal Medicine

## 2020-07-16 DIAGNOSIS — F332 Major depressive disorder, recurrent severe without psychotic features: Secondary | ICD-10-CM | POA: Diagnosis not present

## 2020-07-16 DIAGNOSIS — N184 Chronic kidney disease, stage 4 (severe): Secondary | ICD-10-CM

## 2020-07-16 DIAGNOSIS — I10 Essential (primary) hypertension: Secondary | ICD-10-CM | POA: Diagnosis not present

## 2020-07-16 DIAGNOSIS — N1832 Chronic kidney disease, stage 3b: Secondary | ICD-10-CM

## 2020-07-16 DIAGNOSIS — N178 Other acute kidney failure: Secondary | ICD-10-CM | POA: Diagnosis not present

## 2020-07-16 DIAGNOSIS — R0602 Shortness of breath: Secondary | ICD-10-CM

## 2020-07-16 DIAGNOSIS — E1122 Type 2 diabetes mellitus with diabetic chronic kidney disease: Secondary | ICD-10-CM

## 2020-07-16 DIAGNOSIS — I214 Non-ST elevation (NSTEMI) myocardial infarction: Secondary | ICD-10-CM

## 2020-07-16 DIAGNOSIS — I5021 Acute systolic (congestive) heart failure: Secondary | ICD-10-CM

## 2020-07-16 LAB — BASIC METABOLIC PANEL
Anion gap: 9 (ref 5–15)
BUN: 46 mg/dL — ABNORMAL HIGH (ref 6–20)
CO2: 29 mmol/L (ref 22–32)
Calcium: 8.8 mg/dL — ABNORMAL LOW (ref 8.9–10.3)
Chloride: 106 mmol/L (ref 98–111)
Creatinine, Ser: 2.96 mg/dL — ABNORMAL HIGH (ref 0.61–1.24)
GFR, Estimated: 24 mL/min — ABNORMAL LOW (ref >60)
Glucose, Bld: 145 mg/dL — ABNORMAL HIGH (ref 70–99)
Potassium: 3.4 mmol/L — ABNORMAL LOW (ref 3.5–5.1)
Sodium: 144 mmol/L (ref 135–145)

## 2020-07-16 LAB — LIPID PANEL
Cholesterol: 186 mg/dL (ref 0–200)
HDL: 40 mg/dL — ABNORMAL LOW (ref >40)
LDL Cholesterol: 110 mg/dL — ABNORMAL HIGH (ref 0–99)
Total CHOL/HDL Ratio: 4.7 RATIO
Triglycerides: 178 mg/dL — ABNORMAL HIGH (ref <150)
VLDL: 36 mg/dL (ref 0–40)

## 2020-07-16 LAB — ECHOCARDIOGRAM COMPLETE
AR max vel: 2.37 cm2
AV Area VTI: 2.34 cm2
AV Area mean vel: 2.48 cm2
AV Mean grad: 3 mmHg
AV Peak grad: 5.1 mmHg
Ao pk vel: 1.13 m/s
Area-P 1/2: 3.2 cm2
Calc EF: 40.7 %
Height: 65 in
MV VTI: 2.07 cm2
S' Lateral: 4.5 cm
Single Plane A2C EF: 39.4 %
Single Plane A4C EF: 40.2 %
Weight: 2476.21 oz

## 2020-07-16 LAB — GLUCOSE, CAPILLARY
Glucose-Capillary: 153 mg/dL — ABNORMAL HIGH (ref 70–99)
Glucose-Capillary: 207 mg/dL — ABNORMAL HIGH (ref 70–99)
Glucose-Capillary: 213 mg/dL — ABNORMAL HIGH (ref 70–99)
Glucose-Capillary: 189 mg/dL — ABNORMAL HIGH (ref 70–99)
Glucose-Capillary: 218 mg/dL — ABNORMAL HIGH (ref 70–99)

## 2020-07-16 LAB — CBC
HCT: 33.7 % — ABNORMAL LOW (ref 39.0–52.0)
Hemoglobin: 11.7 g/dL — ABNORMAL LOW (ref 13.0–17.0)
MCH: 29.9 pg (ref 26.0–34.0)
MCHC: 34.7 g/dL (ref 30.0–36.0)
MCV: 86.2 fL (ref 80.0–100.0)
Platelets: 232 10*3/uL (ref 150–400)
RBC: 3.91 MIL/uL — ABNORMAL LOW (ref 4.22–5.81)
RDW: 12.2 % (ref 11.5–15.5)
WBC: 7.8 10*3/uL (ref 4.0–10.5)
nRBC: 0 % (ref 0.0–0.2)

## 2020-07-16 LAB — MAGNESIUM: Magnesium: 1.9 mg/dL (ref 1.7–2.4)

## 2020-07-16 LAB — HEPARIN LEVEL (UNFRACTIONATED): Heparin Unfractionated: 0.19 IU/mL — ABNORMAL LOW (ref 0.30–0.70)

## 2020-07-16 MED ORDER — CITALOPRAM HYDROBROMIDE 20 MG PO TABS
20.0000 mg | ORAL_TABLET | Freq: Every day | ORAL | Status: DC
  Administered 2020-07-16 – 2020-07-25 (×10): 20 mg via ORAL
  Filled 2020-07-16 (×10): qty 1

## 2020-07-16 MED ORDER — GLUCERNA SHAKE PO LIQD
237.0000 mL | Freq: Two times a day (BID) | ORAL | Status: DC
  Administered 2020-07-17 – 2020-07-25 (×12): 237 mL via ORAL

## 2020-07-16 MED ORDER — PANTOPRAZOLE SODIUM 40 MG PO TBEC
40.0000 mg | DELAYED_RELEASE_TABLET | Freq: Every day | ORAL | Status: DC
  Administered 2020-07-16 – 2020-07-25 (×10): 40 mg via ORAL
  Filled 2020-07-16 (×9): qty 1

## 2020-07-16 MED ORDER — ONDANSETRON HCL 4 MG/2ML IJ SOLN
4.0000 mg | Freq: Four times a day (QID) | INTRAMUSCULAR | Status: DC | PRN
  Administered 2020-07-16: 12:00:00 4 mg via INTRAVENOUS
  Filled 2020-07-16: qty 2

## 2020-07-16 MED ORDER — POTASSIUM CHLORIDE CRYS ER 20 MEQ PO TBCR
40.0000 meq | EXTENDED_RELEASE_TABLET | Freq: Once | ORAL | Status: AC
  Administered 2020-07-16: 40 meq via ORAL
  Filled 2020-07-16: qty 2

## 2020-07-16 MED ORDER — HEPARIN BOLUS VIA INFUSION
2100.0000 [IU] | Freq: Once | INTRAVENOUS | Status: AC
  Administered 2020-07-16: 2100 [IU] via INTRAVENOUS
  Filled 2020-07-16: qty 2100

## 2020-07-16 MED ORDER — HEPARIN (PORCINE) 25000 UT/250ML-% IV SOLN
1100.0000 [IU]/h | INTRAVENOUS | Status: DC
  Administered 2020-07-16: 850 [IU]/h via INTRAVENOUS
  Administered 2020-07-17: 1100 [IU]/h via INTRAVENOUS
  Filled 2020-07-16 (×2): qty 250

## 2020-07-16 MED ORDER — ADULT MULTIVITAMIN W/MINERALS CH
1.0000 | ORAL_TABLET | Freq: Every day | ORAL | Status: DC
  Administered 2020-07-16 – 2020-07-25 (×10): 1 via ORAL
  Filled 2020-07-16 (×10): qty 1

## 2020-07-16 MED ORDER — CALCIUM CARBONATE ANTACID 500 MG PO CHEW
1.0000 | CHEWABLE_TABLET | Freq: Three times a day (TID) | ORAL | Status: DC | PRN
  Administered 2020-07-16: 200 mg via ORAL
  Filled 2020-07-16: qty 1

## 2020-07-16 MED ORDER — ALUM & MAG HYDROXIDE-SIMETH 200-200-20 MG/5ML PO SUSP
30.0000 mL | ORAL | Status: DC | PRN
  Administered 2020-07-16 – 2020-07-22 (×6): 30 mL via ORAL
  Filled 2020-07-16 (×6): qty 30

## 2020-07-16 MED ORDER — POTASSIUM CHLORIDE 20 MEQ PO PACK
40.0000 meq | PACK | Freq: Once | ORAL | Status: AC
  Administered 2020-07-16: 40 meq via ORAL
  Filled 2020-07-16: qty 2

## 2020-07-16 MED ORDER — HEPARIN BOLUS VIA INFUSION
4000.0000 [IU] | Freq: Once | INTRAVENOUS | Status: AC
  Administered 2020-07-16: 4000 [IU] via INTRAVENOUS
  Filled 2020-07-16: qty 4000

## 2020-07-16 NOTE — Consult Note (Signed)
[facility] Consult   Reason for Consult: Consult for this 60 year old man who came into the hospital with chest pain and then made statements about being suicidal Referring Physician: Burnett Harry Patient Identification: Jimmy Olson MRN:  [id number] Principal Diagnosis: Severe recurrent major depression without psychotic features [facility]) Diagnosis:  Principal Problem:   Severe recurrent major depression without psychotic features ([place]) Active Problems:   Essential hypertension   Elevated troponin   Type II diabetes mellitus with renal manifestations ([place])   Acute on chronic systolic CHF (congestive heart failure) ([place])   Acute renal failure superimposed on stage 3b chronic kidney disease ([place])   HLD (hyperlipidemia)   Total Time spent with patient: 1 hour  Subjective:   Jimmy Olson is a 60 y.o. male patient admitted with "I was feeling that way because of so many problems".  HPI: Patient seen chart reviewed.  Patient was interviewed entirely with the assistance of a hospital provided [place] interpreter.  Patient came to the hospital with chest pain and shortness of breath.  COVID negative he is now being worked up for congestive heart failure and cardiac concerns.  Patient made a comment to a nurse about having had suicidal thoughts recently.  He tells me that it is true that recently as a week ago he had some suicidal thoughts and even had fantasies of wrecking his car.  He says this was because he felt that his medical problems were overwhelming him.  His doctor had told him that his potassium was abnormal and he thought that was just 1 more thing on top of what he was already dealing with.  Additionally he complains of the stress of he and his wife not getting along and possibly being on the road to getting divorced, and worrying about his adolescent son who has schizophrenia and is disruptive around the house.  Patient has not been able to work recently because of his fatigue  and medical issues.  He says he does not sleep well at night with frequent awakening.  Not eating very well.  Energy level low.  Not interested in normal activities.  He says now he is not having any suicidal thoughts today however.  No intention or plan to harm himself.  Denies any hallucinations or psychotic symptoms.  He is able to articulate reasonable plans about the future and has some hopefulness but still looks very nervous and rundown.  Denies any alcohol or drug abuse.  Not taking any medication currently for mental health issues.  Past Psychiatric History: Patient says he is talk to his primary care doctor about this but he does not think he is ever been prescribed any medicine for depression.  No previous hospitalizations.  Never spoke to a mental health provider specifically in the past.  No past suicide attempts.  Risk to Self:   Risk to Others:   Prior Inpatient Therapy:   Prior Outpatient Therapy:    Past Medical History:  Past Medical History:  Diagnosis Date  . Cardiomyopathy ([place])    a. 07/2009 MV: EF 48%; b. 08/2019 Echo: EF 45-50%. Global HK. Mod LVH. Gr1 DD. Nl RV fxn. Nl PASP.  Marland Kitchen Chest pain    a. 07/2009 MV: EF 48%. No ischemia.  . Diabetes mellitus without complication ([place])   . HFmrEF (heart failure with mildy reduced ejection fraction) ([place])   . Hypertension     Past Surgical History:  Procedure Laterality Date  . APPENDECTOMY     Family History:  Family History  Problem Relation Age of Onset  . Kidney failure Mother        died @ 31  . Heart failure Mother   . Other Father        he never knew his father   Family Psychiatric  History: He has a adolescent son who suffers from schizophrenia Social History:  Social History   Substance and Sexual Activity  Alcohol Use Never     Social History   Substance and Sexual Activity  Drug Use Never    Social History   Socioeconomic History  . Marital status: Married    Spouse name: Not on file  . Number of  children: Not on file  . Years of education: Not on file  . Highest education level: Not on file  Occupational History  . Not on file  Tobacco Use  . Smoking status: Never Smoker  . Smokeless tobacco: Never Used  Substance and Sexual Activity  . Alcohol use: Never  . Drug use: Never  . Sexual activity: Not on file  Other Topics Concern  . Not on file  Social History Narrative   Lives locally with wife and son.  He is currently unemployed - has worked in different industries.     Social Determinants of Health   Financial Resource Strain: Not on file  Food Insecurity: Not on file  Transportation Needs: Not on file  Physical Activity: Not on file  Stress: Not on file  Social Connections: Not on file   Additional Social History:    Allergies:  No Known Allergies  Labs:  Results for orders placed or performed during the hospital encounter of 07/15/20 (from the past 48 hour(s))  CBC with Differential/Platelet     Status: Abnormal   Collection Time: 07/15/20  3:59 AM  Result Value Ref Range   WBC 6.0 4.0 - 10.5 K/uL   RBC 3.98 (L) 4.22 - 5.81 MIL/uL   Hemoglobin 11.9 (L) 13.0 - 17.0 g/dL   HCT 34.6 (L) 39.0 - 52.0 %   MCV 86.9 80.0 - 100.0 fL   MCH 29.9 26.0 - 34.0 pg   MCHC 34.4 30.0 - 36.0 g/dL   RDW 11.9 11.5 - 15.5 %   Platelets 223 150 - 400 K/uL   nRBC 0.0 0.0 - 0.2 %   Neutrophils Relative % 61 %   Neutro Abs 3.7 1.7 - 7.7 K/uL   Lymphocytes Relative 26 %   Lymphs Abs 1.5 0.7 - 4.0 K/uL   Monocytes Relative 7 %   Monocytes Absolute 0.4 0.1 - 1.0 K/uL   Eosinophils Relative 5 %   Eosinophils Absolute 0.3 0.0 - 0.5 K/uL   Basophils Relative 1 %   Basophils Absolute 0.0 0.0 - 0.1 K/uL   Immature Granulocytes 0 %   Abs Immature Granulocytes 0.01 0.00 - 0.07 K/uL    Comment: Performed at [facility], [address] [place], [place] [address]  Comprehensive metabolic panel     Status: Abnormal   Collection Time: 07/15/20  3:59 AM  Result Value Ref  Range   Sodium 141 135 - 145 mmol/L   Potassium 3.7 3.5 - 5.1 mmol/L   Chloride 108 98 - 111 mmol/L   CO2 25 22 - 32 mmol/L   Glucose, Bld 245 (H) 70 - 99 mg/dL    Comment: Glucose reference range applies only to samples taken after fasting for at least 8 hours.   BUN 42 (H) 6 - 20  mg/dL   Creatinine, Ser 3.00 (H) 0.61 - 1.24 mg/dL   Calcium 8.5 (L) 8.9 - 10.3 mg/dL   Total Protein 6.3 (L) 6.5 - 8.1 g/dL   Albumin 3.0 (L) 3.5 - 5.0 g/dL   AST 18 15 - 41 U/L   ALT 15 0 - 44 U/L   Alkaline Phosphatase 81 38 - 126 U/L   Total Bilirubin 0.5 0.3 - 1.2 mg/dL   GFR, Estimated 23 (L) >60 mL/min    Comment: (NOTE) Calculated using the CKD-EPI Creatinine Equation (2021)    Anion gap 8 5 - 15    Comment: Performed at [facility], [address] [place], [place] [address]  Brain natriuretic peptide     Status: Abnormal   Collection Time: 07/15/20  3:59 AM  Result Value Ref Range   B Natriuretic Peptide 834.1 (H) 0.0 - 100.0 pg/mL    Comment: Performed at [facility], [address] [place], [place] [address]  Troponin I (High Sensitivity)     Status: Abnormal   Collection Time: 07/15/20  3:59 AM  Result Value Ref Range   Troponin I (High Sensitivity) 124 (HH) <18 ng/L    Comment: CRITICAL RESULT CALLED TO, READ BACK BY AND VERIFIED WITH LORRIE LEMONS RN [phone] 07/15/20 HNM (NOTE) Elevated high sensitivity troponin I (hsTnI) values and significant  changes across serial measurements may suggest ACS but many other  chronic and acute conditions are known to elevate hsTnI results.  Refer to the "Links" section for chest pain algorithms and additional  guidance. Performed at [facility], [address] [place], [place] [address]   Lactic acid, plasma     Status: None   Collection Time: 07/15/20  3:59 AM  Result Value Ref Range   Lactic Acid, Venous 1.2 0.5 - 1.9 mmol/L    Comment: Performed at [facility], [address] [place], [place]  [address]  SARS Coronavirus 2 by RT PCR (hospital order, performed in [facility] hospital lab) Nasopharyngeal Nasopharyngeal Swab     Status: None   Collection Time: 07/15/20  3:59 AM   Specimen: Nasopharyngeal Swab  Result Value Ref Range   SARS Coronavirus 2 NEGATIVE NEGATIVE    Comment: (NOTE) SARS-CoV-2 target nucleic acids are NOT DETECTED.  The SARS-CoV-2 RNA is generally detectable in upper and lower respiratory specimens during the acute phase of infection. The lowest concentration of SARS-CoV-2 viral copies this assay can detect is 250 copies / mL. A negative result does not preclude SARS-CoV-2 infection and should not be used as the sole basis for treatment or other patient management decisions.  A negative result may occur with improper specimen collection / handling, submission of specimen other than nasopharyngeal swab, presence of viral mutation(s) within the areas targeted by this assay, and inadequate number of viral copies (<250 copies / mL). A negative result must be combined with clinical observations, patient history, and epidemiological information.  Fact Sheet for Patients:   StrictlyIdeas.no  Fact Sheet for Healthcare Providers: BankingDealers.co.za  This test is not yet approved or  cleared by the [place] FDA and has been authorized for detection and/or diagnosis of SARS-CoV-2 by FDA under an Emergency Use Authorization (EUA).  This EUA will remain in effect (meaning this test can be used) for the duration of the COVID-19 declaration under Section 564(b)(1) of the Act, 21 U.S.C. section 360bbb-3(b)(1), unless the authorization is terminated or revoked sooner.  Performed at [facility], [address] [place], [place] [address]   Culture, blood (single)     Status: None (Preliminary result)   Collection Time: 07/15/20  3:59 AM   Specimen: BLOOD  Result Value Ref Range   Specimen Description  BLOOD RIGHT FA    Special Requests      BOTTLES DRAWN AEROBIC AND ANAEROBIC Blood Culture results may not be optimal due to an excessive volume of blood received in culture bottles   Culture      NO GROWTH 1 DAY Performed at [facility], [address], [place], [place] [address]    Report Status PENDING   Troponin I (High Sensitivity)     Status: Abnormal   Collection Time: 07/15/20  5:35 AM  Result Value Ref Range   Troponin I (High Sensitivity) 127 (HH) <18 ng/L    Comment: CRITICAL VALUE NOTED. VALUE IS CONSISTENT WITH PREVIOUSLY REPORTED/CALLED VALUE HNM (NOTE) Elevated high sensitivity troponin I (hsTnI) values and significant  changes across serial measurements may suggest ACS but many other  chronic and acute conditions are known to elevate hsTnI results.  Refer to the "Links" section for chest pain algorithms and additional  guidance. Performed at [facility], [address] [place], [place] [address]   CBG monitoring, ED     Status: Abnormal   Collection Time: 07/15/20 11:23 AM  Result Value Ref Range   Glucose-Capillary 163 (H) 70 - 99 mg/dL    Comment: Glucose reference range applies only to samples taken after fasting for at least 8 hours.  Troponin I (High Sensitivity)     Status: Abnormal   Collection Time: 07/15/20 11:24 AM  Result Value Ref Range   Troponin I (High Sensitivity) 176 (HH) <18 ng/L    Comment: CRITICAL VALUE NOTED. VALUE IS CONSISTENT WITH PREVIOUSLY REPORTED/CALLED VALUE DAS (NOTE) Elevated high sensitivity troponin I (hsTnI) values and significant  changes across serial measurements may suggest ACS but many other  chronic and acute conditions are known to elevate hsTnI results.  Refer to the "Links" section for chest pain algorithms and additional  guidance. Performed at [facility], [address] [place], [place] [address]   Hemoglobin A1c     Status: Abnormal   Collection Time: 07/15/20 11:24 AM  Result  Value Ref Range   Hgb A1c MFr Bld 9.7 (H) 4.8 - 5.6 %    Comment: (NOTE) Pre diabetes:          5.7%-6.4%  Diabetes:              >6.4%  Glycemic control for   <7.0% adults with diabetes    Mean Plasma Glucose 231.69 mg/dL    Comment: Performed at [facility] [address] [place], [place] [address]  CBG monitoring, ED     Status: Abnormal   Collection Time: 07/15/20  4:24 PM  Result Value Ref Range   Glucose-Capillary 231 (H) 70 - 99 mg/dL    Comment: Glucose reference range applies only to samples taken after fasting for at least 8 hours.  Troponin I (High Sensitivity)     Status: Abnormal   Collection Time: 07/15/20  7:43 PM  Result Value Ref Range   Troponin I (High Sensitivity) 190 (HH) <18 ng/L    Comment: CRITICAL VALUE NOTED. VALUE IS CONSISTENT WITH PREVIOUSLY REPORTED/CALLED VALUE MJU (NOTE) Elevated high sensitivity troponin I (hsTnI) values and significant  changes across serial measurements may suggest ACS but many other  chronic and acute conditions are  known to elevate hsTnI results.  Refer to the "Links" section for chest pain algorithms and additional  guidance. Performed at [facility], [address] [place], [place] [address]   CBG monitoring, ED     Status: Abnormal   Collection Time: 07/15/20  7:56 PM  Result Value Ref Range   Glucose-Capillary 212 (H) 70 - 99 mg/dL    Comment: Glucose reference range applies only to samples taken after fasting for at least 8 hours.  Glucose, capillary     Status: Abnormal   Collection Time: 07/15/20 10:23 PM  Result Value Ref Range   Glucose-Capillary 134 (H) 70 - 99 mg/dL    Comment: Glucose reference range applies only to samples taken after fasting for at least 8 hours.  Troponin I (High Sensitivity)     Status: Abnormal   Collection Time: 07/15/20 10:40 PM  Result Value Ref Range   Troponin I (High Sensitivity) 200 (HH) <18 ng/L    Comment: CRITICAL VALUE NOTED. VALUE IS CONSISTENT WITH  PREVIOUSLY REPORTED/CALLED VALUE SKL (NOTE) Elevated high sensitivity troponin I (hsTnI) values and significant  changes across serial measurements may suggest ACS but many other  chronic and acute conditions are known to elevate hsTnI results.  Refer to the "Links" section for chest pain algorithms and additional  guidance. Performed at [facility], [address] [place], [place] [address]   Lipid panel     Status: Abnormal   Collection Time: 07/16/20  5:32 AM  Result Value Ref Range   Cholesterol 186 0 - 200 mg/dL   Triglycerides 178 (H) <150 mg/dL   HDL 40 (L) >40 mg/dL   Total CHOL/HDL Ratio 4.7 RATIO   VLDL 36 0 - 40 mg/dL   LDL Cholesterol 110 (H) 0 - 99 mg/dL    Comment:        Total Cholesterol/HDL:CHD Risk Coronary Heart Disease Risk Table                     Men   Women  1/2 Average Risk   3.4   3.3  Average Risk       5.0   4.4  2 X Average Risk   9.6   7.1  3 X Average Risk  23.4   11.0        Use the calculated Patient Ratio above and the CHD Risk Table to determine the patient's CHD Risk.        ATP III CLASSIFICATION (LDL):  <100     mg/dL   Optimal  100-129  mg/dL   Near or Above                    Optimal  130-159  mg/dL   Borderline  160-189  mg/dL   High  >190     mg/dL   Very High Performed at [facility], [address] [place], [place] [id number]   Basic metabolic panel     Status: Abnormal   Collection Time: 07/16/20  5:32 AM  Result Value Ref Range   Sodium 144 135 - 145 mmol/L   Potassium 3.4 (L) 3.5 - 5.1 mmol/L   Chloride 106 98 - 111 mmol/L   CO2 29 22 - 32 mmol/L   Glucose, Bld 145 (H) 70 - 99 mg/dL    Comment: Glucose reference range applies only to samples taken after fasting for at least 8 hours.   BUN 46 (H) 6 -  20 mg/dL   Creatinine, Ser 2.96 (H) 0.61 - 1.24 mg/dL   Calcium 8.8 (L) 8.9 - 10.3 mg/dL   GFR, Estimated 24 (L) >60 mL/min    Comment: (NOTE) Calculated using the CKD-EPI Creatinine Equation  (2021)    Anion gap 9 5 - 15    Comment: Performed at [facility], [address] [place], [place] [address]  Magnesium     Status: None   Collection Time: 07/16/20  5:32 AM  Result Value Ref Range   Magnesium 1.9 1.7 - 2.4 mg/dL    Comment: Performed at [facility], [address] [place], [place] [address]  CBC     Status: Abnormal   Collection Time: 07/16/20  5:32 AM  Result Value Ref Range   WBC 7.8 4.0 - 10.5 K/uL   RBC 3.91 (L) 4.22 - 5.81 MIL/uL   Hemoglobin 11.7 (L) 13.0 - 17.0 g/dL   HCT 33.7 (L) 39.0 - 52.0 %   MCV 86.2 80.0 - 100.0 fL   MCH 29.9 26.0 - 34.0 pg   MCHC 34.7 30.0 - 36.0 g/dL   RDW 12.2 11.5 - 15.5 %   Platelets 232 150 - 400 K/uL   nRBC 0.0 0.0 - 0.2 %    Comment: Performed at [facility], [address] [place], [place] [address]  Glucose, capillary     Status: Abnormal   Collection Time: 07/16/20  7:33 AM  Result Value Ref Range   Glucose-Capillary 153 (H) 70 - 99 mg/dL    Comment: Glucose reference range applies only to samples taken after fasting for at least 8 hours.  Glucose, capillary     Status: Abnormal   Collection Time: 07/16/20 11:45 AM  Result Value Ref Range   Glucose-Capillary 207 (H) 70 - 99 mg/dL    Comment: Glucose reference range applies only to samples taken after fasting for at least 8 hours.    Current Facility-Administered Medications  Medication Dose Route Frequency Provider Last Rate Last Admin  . 0.9 %  sodium chloride infusion  250 mL Intravenous PRN Ivor Costa, MD      . acetaminophen (TYLENOL) tablet 650 mg  650 mg Oral Q6H PRN Ivor Costa, MD      . albuterol (VENTOLIN HFA) 108 (90 Base) MCG/ACT inhaler 2 puff  2 puff Inhalation Q4H PRN Ivor Costa, MD      . amLODipine (NORVASC) tablet 5 mg  5 mg Oral Daily Ivor Costa, MD   5 mg at 07/16/20 1005  . aspirin EC tablet 81 mg  81 mg Oral Daily Ivor Costa, MD   81 mg at 07/16/20 1005  . bisoprolol (ZEBETA) tablet 10 mg  10 mg Oral Daily Ivor Costa, MD   10 mg at 07/16/20 1005  . citalopram (CELEXA) tablet 20 mg  20 mg Oral Daily Clapacs, John T, MD      . dextromethorphan-guaiFENesin ([place] DM) 30-600 MG per 12 hr tablet 1 tablet  1 tablet Oral BID PRN Ivor Costa, MD      . Derrill Memo ON 07/17/2020] feeding supplement (GLUCERNA SHAKE) (GLUCERNA SHAKE) liquid 237 mL  237 mL Oral BID BM Hosie Poisson, MD      . furosemide (LASIX) injection 40 mg  40 mg Intravenous Huntley Dec, MD   40 mg at 07/16/20 0412  . heparin ADULT infusion 100 units/mL (25000 units/2105m)  850 Units/hr Intravenous Continuous HDarnelle Bos RPH 8.5 mL/hr at 07/16/20 1412 850 Units/hr at  07/16/20 1412  . hydrALAZINE (APRESOLINE) injection 5 mg  5 mg Intravenous Q2H PRN Ivor Costa, MD   5 mg at 07/15/20 2225  . insulin aspart (novoLOG) injection 0-5 Units  0-5 Units Subcutaneous QHS Ivor Costa, MD      . insulin aspart (novoLOG) injection 0-9 Units  0-9 Units Subcutaneous TID WC Ivor Costa, MD   3 Units at 07/16/20 1222  . insulin detemir (LEVEMIR) injection 30 Units  30 Units Subcutaneous BID Ivor Costa, MD   30 Units at 07/16/20 1027  . isosorbide mononitrate (IMDUR) 24 hr tablet 30 mg  30 mg Oral Daily Ivor Costa, MD   30 mg at 07/16/20 1005  . multivitamin with minerals tablet 1 tablet  1 tablet Oral Daily Hosie Poisson, MD      . nitroGLYCERIN (NITROSTAT) SL tablet 0.4 mg  0.4 mg Sublingual Q5 min PRN Ivor Costa, MD   0.4 mg at 07/15/20 1157  . ondansetron (ZOFRAN) injection 4 mg  4 mg Intravenous Q6H PRN Hosie Poisson, MD   4 mg at 07/16/20 1222  . ondansetron (ZOFRAN) tablet 4 mg  4 mg Oral Q6H PRN Ivor Costa, MD      . rosuvastatin (CRESTOR) tablet 10 mg  10 mg Oral Daily Ivor Costa, MD   10 mg at 07/15/20 2225  . sodium chloride flush (NS) 0.9 % injection 3 mL  3 mL Intravenous Q12H Ivor Costa, MD   3 mL at 07/16/20 1007  . sodium chloride flush (NS) 0.9 % injection 3 mL  3 mL Intravenous PRN Ivor Costa, MD        Musculoskeletal: Strength & Muscle  Tone: within normal limits Gait & Station: normal Patient leans: N/A  Psychiatric Specialty Exam: Physical Exam Vitals and nursing note reviewed.  Constitutional:      Appearance: He is well-developed and well-nourished.  HENT:     Head: Normocephalic and atraumatic.  Eyes:     Conjunctiva/sclera: Conjunctivae normal.     Pupils: Pupils are equal, round, and reactive to light.  Cardiovascular:     Heart sounds: Normal heart sounds.  Pulmonary:     Effort: Pulmonary effort is normal.  Abdominal:     Palpations: Abdomen is soft.  Musculoskeletal:        General: Normal range of motion.     Cervical back: Normal range of motion.  Skin:    General: Skin is warm and dry.  Neurological:     General: No focal deficit present.     Mental Status: He is alert.  Psychiatric:        Attention and Perception: He is inattentive.        Mood and Affect: Mood is anxious and depressed.        Speech: Speech normal.        Behavior: Behavior is withdrawn.        Thought Content: Thought content is not paranoid or delusional. Thought content includes suicidal ideation. Thought content does not include homicidal ideation. Thought content does not include homicidal or suicidal plan.        Cognition and Memory: Cognition normal.        Judgment: Judgment is impulsive.     Review of Systems  Constitutional: Negative.   HENT: Negative.   Eyes: Negative.   Respiratory: Negative.   Cardiovascular: Negative.   Gastrointestinal: Negative.   Musculoskeletal: Negative.   Skin: Negative.   Neurological: Negative.   Psychiatric/Behavioral: Positive for dysphoric mood, sleep disturbance  and suicidal ideas. The patient is nervous/anxious.     Blood pressure (!) 146/81, pulse 70, temperature 98.7 F (37.1 C), temperature source Oral, resp. rate 20, height '5\' 5"'$  (1.651 m), weight 70.2 kg, SpO2 99 %.Body mass index is 25.75 kg/m.  General Appearance: Casual  Eye Contact:  Minimal  Speech:  Normal  Rate  Volume:  Decreased  Mood:  Dysphoric  Affect:  Depressed  Thought Process:  Coherent  Orientation:  Full (Time, Place, and Person)  Thought Content:  Logical  Suicidal Thoughts:  No  Homicidal Thoughts:  No  Memory:  Immediate;   Fair Recent;   Fair Remote;   Fair  Judgement:  Fair  Insight:  Fair  Psychomotor Activity:  Decreased  Concentration:  Concentration: Fair  Recall:  [employer] of Knowledge:  Fair  Language:  Fair  Akathisia:  No  Handed:  Right  AIMS (if indicated):     Assets:  Communication Skills Desire for Improvement Housing Resilience  ADL's:  Impaired  Cognition:  Impaired,  Mild  Sleep:        Treatment Plan Summary: Medication management and Plan 60 year old man endorsing symptoms of major depression moderate severity.  Currently denying any suicidal thoughts intent or plan.  No evidence of psychosis.  Does not feel he is at all likely to harm himself in the hospital.  Psychoeducation was done with the patient about depression and the importance of taking it seriously and treating it.  We discussed antidepressant medicine.  Patient expressed concern about cost and I reassured him I could find medications that were very inexpensive that worked just as well as anything else.  I propose starting citalopram 20 mg a day for depression.  Patient should be given a prescription of this with a refill when he is discharged and referred back to the [facility] clinic.  Psychoeducation done about the importance of informing people if suicidal thoughts recur and getting help immediately.  I have discontinued the sitter and the suicide precautions in the room at this time.  Disposition: Patient does not meet criteria for psychiatric inpatient admission. Supportive therapy provided about ongoing stressors. Discussed crisis plan, support from social network, calling 911, coming to the Emergency Department, and calling Suicide Hotline.  Alethia Berthold, MD 07/16/2020  4:05 PM

## 2020-07-16 NOTE — Progress Notes (Signed)
Initial Nutrition Assessment  DOCUMENTATION CODES:   Not applicable  INTERVENTION:  Glucerna Shake po BID, each supplement provides 220 kcal and 10 grams of protein  MVI with minerals po daily  Heart Healthy diet education provided  NUTRITION DIAGNOSIS:   Inadequate oral intake related to decreased appetite as evidenced by per patient/family report.  GOAL:   Patient will meet greater than or equal to 90% of their needs    MONITOR:   Weight trends,Labs,I & O's,PO intake,Supplement acceptance  REASON FOR ASSESSMENT:   Malnutrition Screening Tool    ASSESSMENT:  60 year old male with history significant of sCHF with EF 45-50%, HTN, HLD, DM2, COVID-19 infection, and CKD stage IIIb. Pt presented with 2 days of progressively worsening SOB and admitted with acute on chronic sCHF.  Pt curled up in bed, safety sitter present in room. Sitter reports pt consumed 100% of breakfast, but has not received lunch tray. Per sitter, pt has not been feeling well with an episode of vomiting this afternoon. Interpretor services via iPad used to obtain history. Pt reports not feeling well and is tired. Pt denies nausea, says he coughed up some phlegm earlier. He reports trying to eat healthy at home, recalls broccoli, "lots of vegetables", and chicken. Sometimes he "eats bad" but not often. He reports tacos and burgers as bad foods. RD educated on balanced meals and snacks, encouraged fresh fruits/vegetables, whole grains, lean sources of protein, limiting the use of salt, and avoiding high sodium foods. Pt endorsed understanding.   Pt endorses decreased appetite over the past couple of days as well as recent weight wt loss. He recalls usually weighing around 157 lbs. Per chart on 09/29/19 he weighed 72.6 kg (159.72 lbs) on 03/06/20 he weighed 72 kg (158.4 lbs). Weights have trended down 4 lbs (2.5%) in the last 4 months which is insignificant for time frame.   I/Os: -2096 ml since admit UOP: 2200 ml  x 24 hrs  Medications reviewed and include: Lasix IV '40mg'$ , SSI, Levemir 30 units 2 times daily, Imdur  Labs: CBGs 207,153, K 3.4 (L), BUN 46 (H), Cr 2.96 (H), Mg 1.9 (WNL),   NUTRITION - FOCUSED PHYSICAL EXAM:  Flowsheet Row Most Recent Value  Orbital Region No depletion  Upper Arm Region No depletion  Thoracic and Lumbar Region No depletion  Buccal Region Unable to assess  [pt wearing mask]  Temple Region No depletion  Clavicle Bone Region No depletion  Clavicle and Acromion Bone Region No depletion  Scapular Bone Region No depletion  Dorsal Hand No depletion  Patellar Region Unable to assess  [wearing jeans]  Anterior Thigh Region Unable to assess  Posterior Calf Region No depletion  Edema (RD Assessment) None  Hair Reviewed  Eyes Reviewed  Mouth Unable to assess  Skin Reviewed  Nails Reviewed       Diet Order:   Diet Order            Diet heart healthy/carb modified Room service appropriate? Yes; Fluid consistency: Thin; Fluid restriction: 1500 mL Fluid  Diet effective now                 EDUCATION NEEDS:   Education needs have been addressed  Skin:  Skin Assessment: Reviewed RN Assessment  Last BM:  1/26  Height:   Ht Readings from Last 1 Encounters:  07/15/20 '5\' 5"'$  (1.651 m)    Weight:   Wt Readings from Last 1 Encounters:  07/15/20 70.2 kg   BMI:  Body mass index is 25.75 kg/m.  Estimated Nutritional Needs:   Kcal:  2000-2200  Protein:  100-110  Fluid:  1.5 L per MD    Lajuan Lines, RD, LDN Clinical Nutrition After Hours/Weekend Pager # in [place]

## 2020-07-16 NOTE — Consult Note (Signed)
ANTICOAGULATION CONSULT NOTE  Pharmacy Consult for  Indication: ACS/STEMI  No Known Allergies  Patient Measurements: Height: '5\' 5"'$  (165.1 cm) Weight: 70.2 kg (154 lb 12.2 oz) IBW/kg (Calculated) : 61.5 Heparin Dosing Weight: 70.2  Vital Signs: Temp: 97.9 F (36.6 C) (01/28 0738) Temp Source: Oral (01/28 0738) BP: 157/80 (01/28 1146) Pulse Rate: 72 (01/28 1146)  Labs: Recent Labs    07/15/20 0359 07/15/20 0535 07/15/20 1124 07/15/20 1943 07/15/20 2240 07/16/20 0532  HGB 11.9*  --   --   --   --  11.7*  HCT 34.6*  --   --   --   --  33.7*  PLT 223  --   --   --   --  232  CREATININE 3.00*  --   --   --   --  2.96*  TROPONINIHS 124*   < > 176* 190* 200*  --    < > = values in this interval not displayed.    Estimated Creatinine Clearance: 24 mL/min (A) (by C-G formula based on SCr of 2.96 mg/dL (H)).   Medical History: Past Medical History:  Diagnosis Date  . Cardiomyopathy ([place])    a. 07/2009 MV: EF 48%; b. 08/2019 Echo: EF 45-50%. Global HK. Mod LVH. Gr1 DD. Nl RV fxn. Nl PASP.  Marland Kitchen Chest pain    a. 07/2009 MV: EF 48%. No ischemia.  . Diabetes mellitus without complication ([place])   . HFmrEF (heart failure with mildy reduced ejection fraction) ([place])   . Hypertension     Medications:  No PTA AC or APT  Assessment: 60 y.o.malewith medical history significant ofsCHF withEF of 45 to 50%, HTN, HLD, T2DM, COVID-19 infection 4 months ago, CKD 3B, who presents with shortness of breath and intermittent chest pain and chest tightness. He was found to have an elevated BNP at 834.1, troponins elevated at 124>127>200, hypoxic requiring 2 L [place].  Hgb 11.4 and plts WNL Heparin Dosing Weight: 70.2  Goal of Therapy:  Heparin level 0.3-0.7 units/ml Monitor platelets by anticoagulation protocol: Yes   Plan:  Give 4000 units bolus x 1 Start heparin infusion at 850 units/hr Check anti-Xa level in 8 hours and daily while on heparin Continue to monitor H&H and  platelets  Darnelle Bos 07/16/2020,12:37 PM

## 2020-07-16 NOTE — Consult Note (Signed)
Cardiology Consultation:   Patient ID: Jimmy Olson MRN: [id number]; DOB: 1960-10-31  Admit date: 07/15/2020 Date of Consult: 07/16/2020  Primary Care Provider: [facility] Cardiologist: Ida Rogue, MD  [facility] Electrophysiologist:  None    Patient Profile:   Jimmy Olson is a 60 y.o. male with a hx of previous LV dysfunction with most recent 07/16/20 echo LVEF decreased to 30-35%, history of COVID-19 infection 03/06/2020, poorly controlled hypertension, hyperlipidemia, DM2, CKD, and who is being seen today for the evaluation of newly reduced EF at the request of Dr. Karleen Hampshire.  History of Present Illness:   Jimmy Olson is a 60 year old male with PMH as above.  He has a history of chest pain with negative MPI in 2011, hypertension, DM2.  He reports that he currently does not smoke or drink alcohol.   He was last seen during admission 09/12/2019 for chest pain with associated symptoms, occurring most days of the week, lasting 20 to 40 minutes, and resolving spontaneously.  He noted chest pain at rest and with heavy exertion, as well as emotional stress.  He was hypertensive and hypoxic at presentation with tachycardic rate.  Echo 3/25 showed mild LV dysfunction with EF 45 to 50%, global hypokinesis, moderate LVH, G1 DD.  Suspicion was for hypertensive cardiomyopathy.  BNP 744.  COVID-19 negative.  CTA negative for PE but notable for multifocal pneumonia and pulmonary edema.  He was placed on steroids, inhalers, and antihypertensives.  Suspicion was for elevated at hs troponin 2/2 demand ischemia in the setting of hypertensive urgency and respiratory failure.  At time of discharge, recommendation was for outpatient follow-up with likely stress testing versus catheterization pending renal function given AKI during admission and after recovery from pneumonia.   It appears as if he was then lost to follow-up as no MPI available on review of EMR.  He  reports ongoing chest pain ever since his previous admission back in 08/2019.  He reports the chest pain is worse with exertion but also occurs at rest.  He frequently stays awake at night due to chest pain.  His chest pain is also interfering with his job.  He works as a Magazine features editor products.  Chest pain is further described as substernal with radiation into his bilateral shoulders.  He has also noticed associated GI symptoms, including borborygmi and nausea/reflux.  He also notes shortness of breath with exertion and fatigue / weakness.  He denies any symptoms of volume overload including lower extremity edema, abdominal distention, orthopnea, and PND.  He reports concern that he is no longer able to do his job 2/2 symptoms.  In the ED 07/15/2020, initial vitals significant for significantly elevated BP 170/104, HR 92 bpm, 92% ORA.  Labs showed hemoglobin 11.9, hematocrit 34.6, elevated creatinine 3.0, BUN 42, glucose 245, calcium 8.5, albumin 3.0, BNP 834.1, high-sensitivity troponin 124.  COVID-19 negative.  EKG showed NSR, 93 bpm, IVCD with repolarization abnormalities, new inferolateral T wave inversions.  Chest x-ray showed lower lung volumes with diffuse bilateral increased pulmonary interstitial opacity.  Images were thought to favor pulmonary edema with suspected small volume pleural fluid on the right. He was started on IV heparin and cardiology consulted.   Most recent echo as above with reduced LVEF 30-35%.   Past Medical History:  Diagnosis Date  . Cardiomyopathy ([place])    a. 07/2009 MV: EF 48%; b. 08/2019 Echo: EF 45-50%. Global HK. Mod LVH. Gr1 DD. Nl RV  fxn. Nl PASP.  Marland Kitchen Chest pain    a. 07/2009 MV: EF 48%. No ischemia.  . Diabetes mellitus without complication ([place])   . HFmrEF (heart failure with mildy reduced ejection fraction) ([place])   . Hypertension     Past Surgical History:  Procedure Laterality Date  . APPENDECTOMY       Home Medications:  Prior to  Admission medications   Medication Sig Start Date End Date Taking? Authorizing Provider  insulin detemir (LEVEMIR) 100 UNIT/ML FlexPen Inject 20 Units into the skin 2 (two) times daily. Patient taking differently: Inject 45 Units into the skin 2 (two) times daily. 09/15/19  Yes Nicole Kindred A, DO  insulin lispro (HUMALOG) 100 UNIT/ML KwikPen Inject 0.03 mLs (3 Units total) into the skin 3 (three) times daily with meals. Do not take if you do not eat more than 50% of meal Patient taking differently: Inject 5 Units into the skin 3 (three) times daily with meals. Do not take if you do not eat more than 50% of meal 09/15/19  Yes Nicole Kindred A, DO  isosorbide mononitrate (IMDUR) 30 MG 24 hr tablet Take 1 tablet (30 mg total) by mouth daily. 09/16/19  Yes Nicole Kindred A, DO  lisinopril (ZESTRIL) 30 MG tablet Take 1 tablet (30 mg total) by mouth daily. 03/06/20  Yes Harvest Dark, MD  rosuvastatin (CRESTOR) 10 MG tablet Take 10 mg by mouth daily.   Yes [provider]  Vitamin D, Ergocalciferol, (DRISDOL) 1.25 MG (50000 UNIT) CAPS capsule Take 50,000 Units by mouth every Tuesday.   Yes [provider]  amLODipine (NORVASC) 10 MG tablet Take 1 tablet (10 mg total) by mouth daily. Patient not taking: Reported on 07/15/2020 03/06/20   Harvest Dark, MD  bisoprolol (ZEBETA) 10 MG tablet Take 1 tablet (10 mg total) by mouth daily. Patient not taking: Reported on 07/15/2020 09/16/19   Nicole Kindred A, DO  hydrALAZINE (APRESOLINE) 50 MG tablet Take 1 tablet (50 mg total) by mouth 3 (three) times daily. Patient not taking: No sig reported 09/15/19   Nicole Kindred A, DO  nitroGLYCERIN (NITROSTAT) 0.4 MG SL tablet Place 1 tablet (0.4 mg total) under the tongue every 5 (five) minutes as needed for chest pain. Patient not taking: No sig reported 09/15/19   Ezekiel Slocumb, DO    Inpatient Medications: Scheduled Meds: . amLODipine  5 mg Oral Daily  . aspirin EC  81 mg Oral Daily   . bisoprolol  10 mg Oral Daily  . citalopram  20 mg Oral Daily  . [START ON 07/17/2020] feeding supplement (GLUCERNA SHAKE)  237 mL Oral BID BM  . furosemide  40 mg Intravenous Q12H  . insulin aspart  0-5 Units Subcutaneous QHS  . insulin aspart  0-9 Units Subcutaneous TID WC  . insulin detemir  30 Units Subcutaneous BID  . isosorbide mononitrate  30 mg Oral Daily  . multivitamin with minerals  1 tablet Oral Daily  . rosuvastatin  10 mg Oral Daily  . sodium chloride flush  3 mL Intravenous Q12H   Continuous Infusions: . sodium chloride    . heparin 850 Units/hr (07/16/20 1412)   PRN Meds: sodium chloride, acetaminophen, albuterol, dextromethorphan-guaiFENesin, hydrALAZINE, nitroGLYCERIN, ondansetron (ZOFRAN) IV, ondansetron, sodium chloride flush  Allergies:   No Known Allergies  Social History:   Social History   Socioeconomic History  . Marital status: Married    Spouse name: Not on file  . Number of children: Not on file  .  Years of education: Not on file  . Highest education level: Not on file  Occupational History  . Not on file  Tobacco Use  . Smoking status: Never Smoker  . Smokeless tobacco: Never Used  Substance and Sexual Activity  . Alcohol use: Never  . Drug use: Never  . Sexual activity: Not on file  Other Topics Concern  . Not on file  Social History Narrative   Lives locally with wife and son.  He is currently unemployed - has worked in different industries.     Social Determinants of Health   Financial Resource Strain: Not on file  Food Insecurity: Not on file  Transportation Needs: Not on file  Physical Activity: Not on file  Stress: Not on file  Social Connections: Not on file  Intimate Partner Violence: Not on file    Family History:    Family History  Problem Relation Age of Onset  . Kidney failure Mother        died @ 58  . Heart failure Mother   . Other Father        he never knew his father     ROS:  Please see the history of  present illness.  Review of Systems  Constitutional: Positive for malaise/fatigue.  Respiratory: Positive for shortness of breath.   Cardiovascular: Positive for chest pain. Negative for palpitations, orthopnea and leg swelling.  Gastrointestinal: Positive for abdominal pain, heartburn and nausea.  Neurological: Positive for weakness.  All other systems reviewed and are negative.   All other ROS reviewed and negative.     Physical Exam/Data:   Vitals:   07/16/20 0452 07/16/20 0738 07/16/20 1146 07/16/20 1529  BP: (!) 160/80 (!) 152/92 (!) 157/80 (!) 146/81  Pulse: 67 68 72 70  Resp:  18 20   Temp:  97.9 F (36.6 C)  98.7 F (37.1 C)  TempSrc:  Oral  Oral  SpO2:  100% 97% 99%  Weight:      Height:        Intake/Output Summary (Last 24 hours) at 07/16/2020 1612 Last data filed at 07/16/2020 1330 Gross per 24 hour  Intake 240 ml  Output 2 ml  Net 238 ml   Last 3 Weights 07/15/2020 03/06/2020 09/29/2019  Weight (lbs) 154 lb 12.2 oz 158 lb 11.7 oz 160 lb  Weight (kg) 70.2 kg 72 kg 72.576 kg     Body mass index is 25.75 kg/m.  General:  Well nourished, well developed, in no acute distress HEENT: normal Lymph: no adenopathy Neck: no JVD Endocrine:  No thryomegaly Vascular: No carotid bruits; FA pulses 2+ bilaterally without bruits  Cardiac:  normal S1, S2; RRR; no murmur  Lungs:  Bibasilar crackles, no wheezing, rhonchi or rales  Abd: soft, nontender, no hepatomegaly  Ext: no edema Musculoskeletal:  No deformities, BUE and BLE strength normal and equal Skin: warm and dry  Neuro:  CNs 2-12 intact, no focal abnormalities noted Psych:  Normal affect   EKG:  The EKG was personally reviewed and demonstrates:  EKG showed NSR, 93 bpm, IVCD with repolarization abnormality, inferolateral T wave inversions.  Telemetry:  Telemetry was personally reviewed and demonstrates:  NSR  Relevant CV Studies: Echocardiogram 07/16/2020 1. Left ventricular ejection fraction, by estimation,  is 30 to 35%. The  left ventricle has moderately decreased function. The left ventricle  demonstrates global hypokinesis. The left ventricular internal cavity size  was moderately dilated. There is mild  left ventricular hypertrophy. Left ventricular diastolic  parameters are  consistent with Grade II diastolic dysfunction (pseudonormalization). The  average left ventricular global longitudinal strain is -10.1 %. The global  longitudinal strain is abnormal.  2. Right ventricular systolic function is normal. The right ventricular  size is normal. Tricuspid regurgitation signal is inadequate for assessing  PA pressure.  3. Left atrial size was mildly dilated.  4. The inferior vena cava is normal in size with greater than 50%  respiratory variability, suggesting right atrial pressure of 3 mmHg.   MPI 07/2009  1.       Normal treadmill EKG without evidence of ischemia or arrhythmia.  2.     Good exercise tolerance for age.  3.     Normal LV systolic function. Ejection fraction 48%.  4.     Normal myocardial perfusion without evidence of myocardial  ischemia.   Laboratory Data:  High Sensitivity Troponin:   Recent Labs  Lab 07/15/20 0359 07/15/20 0535 07/15/20 1124 07/15/20 1943 07/15/20 2240  TROPONINIHS 124* 127* 176* 190* 200*     Chemistry Recent Labs  Lab 07/15/20 0359 07/16/20 0532  NA 141 144  K 3.7 3.4*  CL 108 106  CO2 25 29  GLUCOSE 245* 145*  BUN 42* 46*  CREATININE 3.00* 2.96*  CALCIUM 8.5* 8.8*  GFRNONAA 23* 24*  ANIONGAP 8 9    Recent Labs  Lab 07/15/20 0359  PROT 6.3*  ALBUMIN 3.0*  AST 18  ALT 15  ALKPHOS 81  BILITOT 0.5   Hematology Recent Labs  Lab 07/15/20 0359 07/16/20 0532  WBC 6.0 7.8  RBC 3.98* 3.91*  HGB 11.9* 11.7*  HCT 34.6* 33.7*  MCV 86.9 86.2  MCH 29.9 29.9  MCHC 34.4 34.7  RDW 11.9 12.2  PLT 223 232   BNP Recent Labs  Lab 07/15/20 0359  BNP 834.1*    DDimer No results for input(s): DDIMER in the last 168  hours.   Radiology/Studies:  DG Chest Portable 1 View  Result Date: 07/15/2020 CLINICAL DATA:  60 year old male with shortness of breath, cough for 2 days. 90-92% on room air. EXAM: PORTABLE CHEST 1 VIEW COMPARISON:  Chest radiographs 03/06/2020 and earlier. FINDINGS: Portable AP upright view at 0345 hours. Lower lung volumes. Extent rated cardiac size. Other mediastinal contours are within normal limits. Visualized tracheal air column is within normal limits. Diffuse increased pulmonary interstitial opacity, fairly symmetric and most pronounced at the lung bases. Additional hazy opacity at the right lung base. Evidence of trace fluid in the right minor fissure. No pneumothorax. No acute osseous abnormality identified. Negative visible bowel gas pattern. IMPRESSION: Lower lung volumes with diffuse bilateral increased pulmonary interstitial opacity. Favor acute pulmonary edema, with suspected small volume pleural fluid on the right. Electronically Signed   By: Genevie Ann M.D.   On: 07/15/2020 04:02   ECHOCARDIOGRAM COMPLETE  Result Date: 07/16/2020    ECHOCARDIOGRAM REPORT   Patient Name:   Jimmy Olson Date of Exam: 07/16/2020 Medical Rec #:  [id number]     Height:       65.0 in Accession #:    [id number]    Weight:       154.8 lb Date of Birth:  03-18-1961     BSA:          1.774 m Patient Age:    72 years      BP:           152/92 mmHg Patient Gender: M  HR:           69 bpm. Exam Location:  [facility] Procedure: 2D Echo, Color Doppler, Cardiac Doppler and Strain Analysis Indications:     [id number] CHF-Acute Systolic  History:         Patient has prior history of Echocardiogram examinations, most                  recent 09/11/2019. HFpEF and Cardiomyopathy; Risk                  Factors:Hypertension and Diabetes. Pt had COVID-19 01/2020.  Sonographer:     Charmayne Sheer RDCS (AE) Referring Phys:  [id number] Ivor Costa Diagnosing Phys: Ida Rogue MD  Sonographer Comments: Global longitudinal strain was attempted.  IMPRESSIONS  1. Left ventricular ejection fraction, by estimation, is 30 to 35%. The left ventricle has moderately decreased function. The left ventricle demonstrates global hypokinesis. The left ventricular internal cavity size was moderately dilated. There is mild  left ventricular hypertrophy. Left ventricular diastolic parameters are consistent with Grade II diastolic dysfunction (pseudonormalization). The average left ventricular global longitudinal strain is -10.1 %. The global longitudinal strain is abnormal.  2. Right ventricular systolic function is normal. The right ventricular size is normal. Tricuspid regurgitation signal is inadequate for assessing PA pressure.  3. Left atrial size was mildly dilated.  4. The inferior vena cava is normal in size with greater than 50% respiratory variability, suggesting right atrial pressure of 3 mmHg. FINDINGS  Left Ventricle: Left ventricular ejection fraction, by estimation, is 30 to 35%. The left ventricle has moderately decreased function. The left ventricle demonstrates global hypokinesis. The average left ventricular global longitudinal strain is -10.1 %. The global longitudinal strain is abnormal. The left ventricular internal cavity size was moderately dilated. There is mild left ventricular hypertrophy. Left ventricular diastolic parameters are consistent with Grade II diastolic dysfunction (pseudonormalization). Right Ventricle: The right ventricular size is normal. No increase in right ventricular wall thickness. Right ventricular systolic function is normal. Tricuspid regurgitation signal is inadequate for assessing PA pressure. Left Atrium: Left atrial size was mildly dilated. Right Atrium: Right atrial size was normal in size. Pericardium: There is no evidence of pericardial effusion. Mitral Valve: The mitral valve is normal in structure. Mild mitral valve regurgitation. No evidence of mitral valve stenosis. MV peak gradient, 2.8 mmHg. The mean mitral  valve gradient is 2.0 mmHg. Tricuspid Valve: The tricuspid valve is normal in structure. Tricuspid valve regurgitation is not demonstrated. No evidence of tricuspid stenosis. Aortic Valve: The aortic valve was not well visualized. Aortic valve regurgitation is not visualized. No aortic stenosis is present. Aortic valve mean gradient measures 3.0 mmHg. Aortic valve peak gradient measures 5.1 mmHg. Aortic valve area, by VTI measures 2.34 cm. Pulmonic Valve: The pulmonic valve was normal in structure. Pulmonic valve regurgitation is not visualized. No evidence of pulmonic stenosis. Aorta: The aortic root is normal in size and structure. Venous: The inferior vena cava is normal in size with greater than 50% respiratory variability, suggesting right atrial pressure of 3 mmHg. IAS/Shunts: No atrial level shunt detected by color flow Doppler.  LEFT VENTRICLE PLAX 2D LVIDd:         5.50 cm      Diastology LVIDs:         4.50 cm      LV e' medial:    4.35 cm/s LV PW:         1.20 cm      LV  E/e' medial:  19.8 LV IVS:        0.70 cm      LV e' lateral:   5.55 cm/s LVOT diam:     2.20 cm      LV E/e' lateral: 15.5 LV SV:         54 LV SV Index:   30           2D Longitudinal Strain LVOT Area:     3.80 cm     2D Strain GLS (A2C):   -10.5 %                             2D Strain GLS (A3C):   -10.4 %                             2D Strain GLS (A4C):   -9.3 % LV Volumes (MOD)            2D Strain GLS Avg:     -10.1 % LV vol d, MOD A2C: 170.0 ml LV vol d, MOD A4C: 169.0 ml LV vol s, MOD A2C: 103.0 ml LV vol s, MOD A4C: 101.0 ml LV SV MOD A2C:     67.0 ml LV SV MOD A4C:     169.0 ml LV SV MOD BP:      69.6 ml LEFT ATRIUM           Index LA diam:      4.00 cm 2.25 cm/m LA Vol (A4C): 47.0 ml 26.50 ml/m  AORTIC VALVE                   PULMONIC VALVE AV Area (Vmax):    2.37 cm    PV Vmax:       0.63 m/s AV Area (Vmean):   2.48 cm    PV Vmean:      44.500 cm/s AV Area (VTI):     2.34 cm    PV VTI:        0.124 m AV Vmax:            113.00 cm/s PV Peak grad:  1.6 mmHg AV Vmean:          79.000 cm/s PV Mean grad:  1.0 mmHg AV VTI:            0.229 m AV Peak Grad:      5.1 mmHg AV Mean Grad:      3.0 mmHg LVOT Vmax:         70.50 cm/s LVOT Vmean:        51.600 cm/s LVOT VTI:          0.141 m LVOT/AV VTI ratio: 0.62  AORTA Ao Root diam: 3.00 cm MITRAL VALVE MV Area (PHT): 3.20 cm    SHUNTS MV Area VTI:   2.07 cm    Systemic VTI:  0.14 m MV Peak grad:  2.8 mmHg    Systemic Diam: 2.20 cm MV Mean grad:  2.0 mmHg MV Vmax:       0.84 m/s MV Vmean:      61.0 cm/s MV Decel Time: 237 msec MV E velocity: 86.10 cm/s MV A velocity: 60.40 cm/s MV E/A ratio:  1.43 Ida Rogue MD Electronically signed by Ida Rogue MD Signature Date/Time: 07/16/2020/11:09:56 AM    Final      Assessment and Plan:   1.  NSTEMI --Reports almost constant chest pain since his previous 08/2019 admission. Last MPI in 2011 ruled low risk but without repeat MPI since that time. CP now occurs both at rest and with worse pain noted with exertion.  Previous EF 45 to 50% and decreased to 30-35% by most recent echo.  HS Tn 124  200.  Elevated troponin in the setting of marked hypertension but with consideration of newly reduced EF and chest pain as outlined above.  Further ischemic work-up recommended at this time for risk stratification with MPI versus LHC given Cr of almost 3.0 as below. Risk factors for cardiac etiology include male, recently reduced EF without recent ischemic work-up, poorly controlled DM2, hypertension.   Continue to cycle HS Tn. Serial EKGs.  Continue IV heparin over the weekend for at least 72h IV heparin.  Daily CBC on IV heparin.    Daily BMET given AOCKD.  Continue ASA 81 mg daily, bisoprolol 10 mg daily, Crestor 10 mg daily, and SL nitro for CP. No ACE/ARB/Arni/MRA given AOCKD.  Baseline Cr previously thought 1.8-2.0.  Given current renal function with Cr of almost 3.0, not an ideal candidate for LHC due to contrast exposure. However,  further ischemic workup needed due to reduced EF. Discussed with MD. Will plan to monitor renal function over the weekend to see if improvement in Cr. Defer plan to proceed with LHC. Reassess with rounding MD on Monday. Could consider MPI. NPO after midnight 1/30.   2. AOC HFrEF --Reports shortness of breath with exertion and associated with this chest pain.  Previously, cardiomyopathy thought 2/2 hypertension with recommendation for outpatient stress testing and patient lost to follow-up.  Most recent EF decreased to from 45-50% to 30 to 35% with GLS -10.1%.  Also noted was mild LVH, mild LVH, G2 DD, mild LAE.  BNP elevated at 834.1.  Continue IV diuresis with furosemide 40 mg q12h as renal function allows given Cr as outlined below.  Caution with fluids given reduced EF.  Monitor I's/O's, daily standing weights.  Daily BMET on IV diuresis.  Given most recent decline in EF, further ischemic work-up recommended as above, though not ideal candidate for LHC given renal function with Cr almost 3.0. Discussed with rounding MD with recommendations to reassess 1/31 as above.  3. HTN, poorly controlled --BP significantly elevated at presentation at 170/104.  As noted above, EF previously thought reduced 2/2 hypertensive heart disease; however, cardiac ischemia could not be completely ruled out with recommendation for further work-up with MPI.  He was then lost to follow-up.   Continue amlodipine 5 mg daily, bisoprolol 10 mg daily, IV diuresis, as needed hydralazine, and Imdur.    Titrate antihypertensives as needed for optimize BP control.    No ACE/ARB/Arni 2/2 renal function.  4. Hypokalemia --Potassium 3.4.  Daily BMET.  Replete with goal 4.0.  Mg 1.9 with goal 2.0.  Consider that potassium may be lower than resulted given hyperglycemia.    5. AOCKD --Cr 2.96, BUN 46.    Caution with nephrotoxins.   Caution with IV diuresis and contrast exposure.  No ACE/ARB/Arni 2/2 renal  function.  Not an ideal candidate for LHC given his current renal function as above. Further ischemic workup with LHC versus MPI as renal function allows with close monitoring of renal function over the weekend.   Consider nephrology consultation.  6. Anemia, likely of chronic dz --Daily CBC on IV heparin.  --H&H stable from previous labs at 11.7 with Hct 33.7.  7.  Hypertriglyceridemia --Tg 178.  --Continue current statin for risk factor modification.  If triglycerides remain elevated at recheck, consider Vascepa.  8. HLD --LDL 110 --Continue current statin for risk factor modification.  9. DM2 / Hyperglycemia --Poorly controlled A1C 9.7. --Continue SSI. --Glycemic control recommended for risk factor modification. --Per IM, PCP.      TIMI Risk Score for Unstable Angina or Non-ST Elevation MI:   The patient's TIMI risk score is 5, which indicates a 26% risk of all cause mortality, new or recurrent myocardial infarction or need for urgent revascularization in the next 14 days.   New York Heart Association (NYHA) Functional Class NYHA Class II-III         For questions or updates, please contact [place] Please consult [email or web address] for contact info under    Signed, Arvil Chaco, PA-C  07/16/2020 4:12 PM

## 2020-07-16 NOTE — Progress Notes (Signed)
Completed education via Patent attorney [phone]

## 2020-07-16 NOTE — Progress Notes (Signed)
PROGRESS NOTE    Jimmy Olson  [id number] DOB: [birth year] DOA: 07/15/2020 [facility]CP: [facility] [place]    Chief Complaint  Patient presents with  . Shortness of Breath    Brief Narrative:  Jimmy Olson is a 60 y.o. male with medical history significant of sCHF with EF of 45 to 50%, hypertension, hyperlipidemia, diabetes mellitus, COVID-19 infection 4 months ago, CKD 3B, who presents with shortness of breath.  Patient reports having shortness of breath for more than 2 days associated with intermittent chest pain and chest tightness.  On arrival to ED he was found to have an elevated BNP, troponins elevated at 124 127, 200, hypoxic requiring 2 L of nasal cannula oxygen to keep sats greater than 90%.  Chest x-ray shows interstitial pulmonary edema with possible small right pleural effusion.  Patient admitted for acute on chronic systolic and diastolic heart failure. Cardiology consulted. Assessment & Plan:   Principal Problem:   Acute on chronic systolic CHF (congestive heart failure) (HCC) Active Problems:   Essential hypertension   Elevated troponin   Type II diabetes mellitus with renal manifestations (HCC)   Acute renal failure superimposed on stage 3b chronic kidney disease (HCC)   HLD (hyperlipidemia)    Acute respiratory Failure with hypoxia requiring up to 2 lit of [place] oxygen to keep sats greater than 90%.  Secondary to acute on chronic systolic and diastolic heart failure.  He was started on IV lasix 40 mg BID ,  Cardiology consulted for recommendations.  Continue with strict intake and output. Daily weights.  Monitor renal parameters while on lasix.  2 d echo shows worsening of the left ventricular EF form 40 to 45% to 30 to 35%.  With grade 2 diastolic dysfunction.   NSTEMI Intermittent chest pain and elevated troponins.  Started the patient on IV heparin, continue with aspirin and imdur.  EKG shows t wave inversions in the lateral leads and minimal  ST elevation.    Type 2 DM:  a1C IS 9.7% CBG (last 3)  Recent Labs    07/15/20 2223 07/16/20 0733 07/16/20 1145  GLUCAP 134* 153* 207*   Resume levemir and SSI.    Hypokalemia:  Replace.   AKI: Baseline creatinine appears to be between 1.8 to 2.  Admitted with a creatinine of 3 and creatinine improved to 2.96.  Monitor renal parameters on IV lasix.    Hyperlipidemia:  LDL is 110. Increase crestor to 20 mg daily.    Hypertension:  Well controlled.    DVT prophylaxis: (Lovenox) Code Status: (Fullcode ) Family Communication: none at bedside.  Disposition:   Status is: Inpatient  Remains inpatient appropriate because:Ongoing diagnostic testing needed not appropriate for outpatient work up, Unsafe d/c plan, IV treatments appropriate due to intensity of illness or inability to take PO and Inpatient level of care appropriate due to severity of illness   Dispo: The patient is from: Home              Anticipated d/c is to: Home              Anticipated d/c date is: > 3 days              Patient currently is not medically stable to d/c.   Difficult to place patient No       Consultants:   cardiology   Procedures: Echo   Antimicrobials: none.    Subjective: Intermittent chest pain radiating to the left shoulder.  Objective: Vitals:   07/16/20 0447 07/16/20 0452 07/16/20 0738 07/16/20 1146  BP: (!) 172/85 (!) 160/80 (!) 152/92 (!) 157/80  Pulse: 68 67 68 72  Resp:   18 20  Temp:   97.9 F (36.6 C)   TempSrc:   Oral   SpO2: 100%  100% 97%  Weight:      Height:        Intake/Output Summary (Last 24 hours) at 07/16/2020 1211 Last data filed at 07/16/2020 1100 Gross per 24 hour  Intake 240 ml  Output 901 ml  Net -661 ml   Filed Weights   07/15/20 0336  Weight: 70.2 kg    Examination:  General exam: Appears calm and comfortable  Respiratory system: basilar crackles and on 2 lit of [place] oxygen, tachypnea on talking.  Cardiovascular system:  S1 & S2 heard, RRR. No JVD, . No pedal edema. Gastrointestinal system: Abdomen is nondistended, soft and nontender.. Normal bowel sounds heard. Central nervous system: Alert and oriented. No focal neurological deficits. Extremities: Symmetric 5 x 5 power. Skin: No rashes, lesions or ulcers Psychiatry:  Mood & affect appropriate.     Data Reviewed: I have personally reviewed following labs and imaging studies  CBC: Recent Labs  Lab 07/15/20 0359 07/16/20 0532  WBC 6.0 7.8  NEUTROABS 3.7  --   HGB 11.9* 11.7*  HCT 34.6* 33.7*  MCV 86.9 86.2  PLT 223 [id number]    Basic Metabolic Panel: Recent Labs  Lab 07/15/20 0359 07/16/20 0532  NA 141 144  K 3.7 3.4*  CL 108 106  CO2 25 29  GLUCOSE 245* 145*  BUN 42* 46*  CREATININE 3.00* 2.96*  CALCIUM 8.5* 8.8*  MG  --  1.9    GFR: Estimated Creatinine Clearance: 24 mL/min (A) (by C-G formula based on SCr of 2.96 mg/dL (H)).  Liver Function Tests: Recent Labs  Lab 07/15/20 0359  AST 18  ALT 15  ALKPHOS 81  BILITOT 0.5  PROT 6.3*  ALBUMIN 3.0*    CBG: Recent Labs  Lab 07/15/20 1624 07/15/20 1956 07/15/20 2223 07/16/20 0733 07/16/20 1145  GLUCAP 231* 212* 134* 153* 207*     Recent Results (from the past 240 hour(s))  SARS Coronavirus 2 by RT PCR (hospital order, performed in [facility] hospital lab) Nasopharyngeal Nasopharyngeal Swab     Status: None   Collection Time: 07/15/20  3:59 AM   Specimen: Nasopharyngeal Swab  Result Value Ref Range Status   SARS Coronavirus 2 NEGATIVE NEGATIVE Final    Comment: (NOTE) SARS-CoV-2 target nucleic acids are NOT DETECTED.  The SARS-CoV-2 RNA is generally detectable in upper and lower respiratory specimens during the acute phase of infection. The lowest concentration of SARS-CoV-2 viral copies this assay can detect is 250 copies / mL. A negative result does not preclude SARS-CoV-2 infection and should not be used as the sole basis for treatment or other patient  management decisions.  A negative result may occur with improper specimen collection / handling, submission of specimen other than nasopharyngeal swab, presence of viral mutation(s) within the areas targeted by this assay, and inadequate number of viral copies (<250 copies / mL). A negative result must be combined with clinical observations, patient history, and epidemiological information.  Fact Sheet for Patients:   StrictlyIdeas.no  Fact Sheet for Healthcare Providers: BankingDealers.co.za  This test is not yet approved or  cleared by the [place] FDA and has been authorized for detection and/or diagnosis of SARS-CoV-2 by FDA under  an Emergency Use Authorization (EUA).  This EUA will remain in effect (meaning this test can be used) for the duration of the COVID-19 declaration under Section 564(b)(1) of the Act, 21 U.S.C. section 360bbb-3(b)(1), unless the authorization is terminated or revoked sooner.  Performed at [facility], [address] [place], [place] [address]   Culture, blood (single)     Status: None (Preliminary result)   Collection Time: 07/15/20  3:59 AM   Specimen: BLOOD  Result Value Ref Range Status   Specimen Description BLOOD RIGHT FA  Final   Special Requests   Final    BOTTLES DRAWN AEROBIC AND ANAEROBIC Blood Culture results may not be optimal due to an excessive volume of blood received in culture bottles   Culture   Final    NO GROWTH 1 DAY Performed at [facility], [address] [place], [place] [address]    Report Status PENDING  Incomplete         Radiology Studies: DG Chest Portable 1 View  Result Date: 07/15/2020 CLINICAL DATA:  60 year old male with shortness of breath, cough for 2 days. 90-92% on room air. EXAM: PORTABLE CHEST 1 VIEW COMPARISON:  Chest radiographs 03/06/2020 and earlier. FINDINGS: Portable AP upright view at 0345 hours. Lower lung volumes. Extent  rated cardiac size. Other mediastinal contours are within normal limits. Visualized tracheal air column is within normal limits. Diffuse increased pulmonary interstitial opacity, fairly symmetric and most pronounced at the lung bases. Additional hazy opacity at the right lung base. Evidence of trace fluid in the right minor fissure. No pneumothorax. No acute osseous abnormality identified. Negative visible bowel gas pattern. IMPRESSION: Lower lung volumes with diffuse bilateral increased pulmonary interstitial opacity. Favor acute pulmonary edema, with suspected small volume pleural fluid on the right. Electronically Signed   By: Genevie Ann M.D.   On: 07/15/2020 04:02   ECHOCARDIOGRAM COMPLETE  Result Date: 07/16/2020    ECHOCARDIOGRAM REPORT   Patient Name:   Jimmy Olson Date of Exam: 07/16/2020 Medical Rec #:  [id number]     Height:       65.0 in Accession #:    [id number]    Weight:       154.8 lb Date of Birth:  [birth year]     BSA:          1.774 m Patient Age:    14 years      BP:           152/92 mmHg Patient Gender: M             HR:           69 bpm. Exam Location:  [facility] Procedure: 2D Echo, Color Doppler, Cardiac Doppler and Strain Analysis Indications:     [id number] CHF-Acute Systolic  History:         Patient has prior history of Echocardiogram examinations, most                  recent 09/11/2019. HFpEF and Cardiomyopathy; Risk                  Factors:Hypertension and Diabetes. Pt had COVID-19 01/2020.  Sonographer:     Charmayne Sheer RDCS (AE) Referring Phys:  [id number] Ivor Costa Diagnosing Phys: Ida Rogue MD  Sonographer Comments: Global longitudinal strain was attempted. IMPRESSIONS  1. Left ventricular ejection fraction, by estimation, is 30 to 35%. The left ventricle has moderately decreased function. The left ventricle demonstrates global hypokinesis.  The left ventricular internal cavity size was moderately dilated. There is mild  left ventricular hypertrophy. Left ventricular diastolic parameters are  consistent with Grade II diastolic dysfunction (pseudonormalization). The average left ventricular global longitudinal strain is -10.1 %. The global longitudinal strain is abnormal.  2. Right ventricular systolic function is normal. The right ventricular size is normal. Tricuspid regurgitation signal is inadequate for assessing PA pressure.  3. Left atrial size was mildly dilated.  4. The inferior vena cava is normal in size with greater than 50% respiratory variability, suggesting right atrial pressure of 3 mmHg. FINDINGS  Left Ventricle: Left ventricular ejection fraction, by estimation, is 30 to 35%. The left ventricle has moderately decreased function. The left ventricle demonstrates global hypokinesis. The average left ventricular global longitudinal strain is -10.1 %. The global longitudinal strain is abnormal. The left ventricular internal cavity size was moderately dilated. There is mild left ventricular hypertrophy. Left ventricular diastolic parameters are consistent with Grade II diastolic dysfunction (pseudonormalization). Right Ventricle: The right ventricular size is normal. No increase in right ventricular wall thickness. Right ventricular systolic function is normal. Tricuspid regurgitation signal is inadequate for assessing PA pressure. Left Atrium: Left atrial size was mildly dilated. Right Atrium: Right atrial size was normal in size. Pericardium: There is no evidence of pericardial effusion. Mitral Valve: The mitral valve is normal in structure. Mild mitral valve regurgitation. No evidence of mitral valve stenosis. MV peak gradient, 2.8 mmHg. The mean mitral valve gradient is 2.0 mmHg. Tricuspid Valve: The tricuspid valve is normal in structure. Tricuspid valve regurgitation is not demonstrated. No evidence of tricuspid stenosis. Aortic Valve: The aortic valve was not well visualized. Aortic valve regurgitation is not visualized. No aortic stenosis is present. Aortic valve mean gradient measures  3.0 mmHg. Aortic valve peak gradient measures 5.1 mmHg. Aortic valve area, by VTI measures 2.34 cm. Pulmonic Valve: The pulmonic valve was normal in structure. Pulmonic valve regurgitation is not visualized. No evidence of pulmonic stenosis. Aorta: The aortic root is normal in size and structure. Venous: The inferior vena cava is normal in size with greater than 50% respiratory variability, suggesting right atrial pressure of 3 mmHg. IAS/Shunts: No atrial level shunt detected by color flow Doppler.  LEFT VENTRICLE PLAX 2D LVIDd:         5.50 cm      Diastology LVIDs:         4.50 cm      LV e' medial:    4.35 cm/s LV PW:         1.20 cm      LV E/e' medial:  19.8 LV IVS:        0.70 cm      LV e' lateral:   5.55 cm/s LVOT diam:     2.20 cm      LV E/e' lateral: 15.5 LV SV:         54 LV SV Index:   30           2D Longitudinal Strain LVOT Area:     3.80 cm     2D Strain GLS (A2C):   -10.5 %                             2D Strain GLS (A3C):   -10.4 %  2D Strain GLS (A4C):   -9.3 % LV Volumes (MOD)            2D Strain GLS Avg:     -10.1 % LV vol d, MOD A2C: 170.0 ml LV vol d, MOD A4C: 169.0 ml LV vol s, MOD A2C: 103.0 ml LV vol s, MOD A4C: 101.0 ml LV SV MOD A2C:     67.0 ml LV SV MOD A4C:     169.0 ml LV SV MOD BP:      69.6 ml LEFT ATRIUM           Index LA diam:      4.00 cm 2.25 cm/m LA Vol (A4C): 47.0 ml 26.50 ml/m  AORTIC VALVE                   PULMONIC VALVE AV Area (Vmax):    2.37 cm    PV Vmax:       0.63 m/s AV Area (Vmean):   2.48 cm    PV Vmean:      44.500 cm/s AV Area (VTI):     2.34 cm    PV VTI:        0.124 m AV Vmax:           113.00 cm/s PV Peak grad:  1.6 mmHg AV Vmean:          79.000 cm/s PV Mean grad:  1.0 mmHg AV VTI:            0.229 m AV Peak Grad:      5.1 mmHg AV Mean Grad:      3.0 mmHg LVOT Vmax:         70.50 cm/s LVOT Vmean:        51.600 cm/s LVOT VTI:          0.141 m LVOT/AV VTI ratio: 0.62  AORTA Ao Root diam: 3.00 cm MITRAL VALVE MV Area (PHT):  3.20 cm    SHUNTS MV Area VTI:   2.07 cm    Systemic VTI:  0.14 m MV Peak grad:  2.8 mmHg    Systemic Diam: 2.20 cm MV Mean grad:  2.0 mmHg MV Vmax:       0.84 m/s MV Vmean:      61.0 cm/s MV Decel Time: 237 msec MV E velocity: 86.10 cm/s MV A velocity: 60.40 cm/s MV E/A ratio:  1.43 Ida Rogue MD Electronically signed by Ida Rogue MD Signature Date/Time: 07/16/2020/11:09:56 AM    Final         Scheduled Meds: . amLODipine  5 mg Oral Daily  . aspirin EC  81 mg Oral Daily  . bisoprolol  10 mg Oral Daily  . enoxaparin (LOVENOX) injection  30 mg Subcutaneous Q24H  . furosemide  40 mg Intravenous Q12H  . insulin aspart  0-5 Units Subcutaneous QHS  . insulin aspart  0-9 Units Subcutaneous TID WC  . insulin detemir  30 Units Subcutaneous BID  . isosorbide mononitrate  30 mg Oral Daily  . rosuvastatin  10 mg Oral Daily  . sodium chloride flush  3 mL Intravenous Q12H   Continuous Infusions: . sodium chloride       LOS: 1 day        Hosie Poisson, MD [facility]   To contact the attending provider between 7A-7P or the covering provider during after hours 7P-7A, please log into the web site [email or web address] and access using universal [place] password for that web  site. If you do not have the password, please call the hospital operator.  07/16/2020, 12:11 PM

## 2020-07-16 NOTE — Progress Notes (Signed)
ANTICOAGULATION CONSULT NOTE - Initial Consult  Pharmacy Consult for Heparin  Indication: chest pain/ACS  No Known Allergies  Patient Measurements: Height: '5\' 5"'$  (165.1 cm) Weight: 70.2 kg (154 lb 12.2 oz) IBW/kg (Calculated) : 61.5 Heparin Dosing Weight: 70.2 kg   Vital Signs: Temp: 97.9 F (36.6 C) (01/28 2005) Temp Source: Oral (01/28 2005) BP: 157/87 (01/28 2005) Pulse Rate: 77 (01/28 2005)  Labs: Recent Labs    07/15/20 0359 07/15/20 0535 07/15/20 1124 07/15/20 1943 07/15/20 2240 07/16/20 0532 07/16/20 2112  HGB 11.9*  --   --   --   --  11.7*  --   HCT 34.6*  --   --   --   --  33.7*  --   PLT 223  --   --   --   --  232  --   HEPARINUNFRC  --   --   --   --   --   --  0.19*  CREATININE 3.00*  --   --   --   --  2.96*  --   TROPONINIHS 124*   < > 176* 190* 200*  --   --    < > = values in this interval not displayed.    Estimated Creatinine Clearance: 24 mL/min (A) (by C-G formula based on SCr of 2.96 mg/dL (H)).   Medical History: Past Medical History:  Diagnosis Date  . Cardiomyopathy ([place])    a. 07/2009 MV: EF 48%; b. 08/2019 Echo: EF 45-50%. Global HK. Mod LVH. Gr1 DD. Nl RV fxn. Nl PASP.  Marland Kitchen Chest pain    a. 07/2009 MV: EF 48%. No ischemia.  . Diabetes mellitus without complication ([place])   . HFmrEF (heart failure with mildy reduced ejection fraction) ([place])   . Hypertension     Medications:  Medications Prior to Admission  Medication Sig Dispense Refill Last Dose  . insulin detemir (LEVEMIR) 100 UNIT/ML FlexPen Inject 20 Units into the skin 2 (two) times daily. (Patient taking differently: Inject 45 Units into the skin 2 (two) times daily.) 15 mL 1 07/14/2020 at Unknown time  . insulin lispro (HUMALOG) 100 UNIT/ML KwikPen Inject 0.03 mLs (3 Units total) into the skin 3 (three) times daily with meals. Do not take if you do not eat more than 50% of meal (Patient taking differently: Inject 5 Units into the skin 3 (three) times daily with meals. Do not take  if you do not eat more than 50% of meal) 15 mL 1 07/14/2020 at Unknown time  . isosorbide mononitrate (IMDUR) 30 MG 24 hr tablet Take 1 tablet (30 mg total) by mouth daily. 30 tablet 1 07/14/2020 at Unknown time  . lisinopril (ZESTRIL) 30 MG tablet Take 1 tablet (30 mg total) by mouth daily. 30 tablet 0 07/14/2020 at Unknown time  . rosuvastatin (CRESTOR) 10 MG tablet Take 10 mg by mouth daily.   07/14/2020 at Unknown time  . Vitamin D, Ergocalciferol, (DRISDOL) 1.25 MG (50000 UNIT) CAPS capsule Take 50,000 Units by mouth every Tuesday.   07/13/2020  . amLODipine (NORVASC) 10 MG tablet Take 1 tablet (10 mg total) by mouth daily. (Patient not taking: Reported on 07/15/2020) 30 tablet 0 Not Taking at Unknown time  . bisoprolol (ZEBETA) 10 MG tablet Take 1 tablet (10 mg total) by mouth daily. (Patient not taking: Reported on 07/15/2020) 30 tablet 1 Not Taking at 1200  . hydrALAZINE (APRESOLINE) 50 MG tablet Take 1 tablet (50 mg total) by mouth 3 (three)  times daily. (Patient not taking: No sig reported) 90 tablet 1 Not Taking at Unknown time  . nitroGLYCERIN (NITROSTAT) 0.4 MG SL tablet Place 1 tablet (0.4 mg total) under the tongue every 5 (five) minutes as needed for chest pain. (Patient not taking: No sig reported) 30 tablet 1 Not Taking at Unknown time    Assessment: Pharmacy consulted to dose heparin in this 60 year old male admitted with ACS/NSTEMI.  CrCl = 24 ml/min No prior anticoag noted.   Goal of Therapy:  Heparin level 0.3-0.7 units/ml Monitor platelets by anticoagulation protocol: Yes   Plan:  1/28:  HL @ 2112 = 0.19 Will order Heparin 2100 units IV X 1 bolus and increase drip rate to 1100 units/hr.  Will recheck HL 8 hrs after rate change.   Lucresia Simic D 07/16/2020,10:53 PM

## 2020-07-16 NOTE — Progress Notes (Signed)
*  PRELIMINARY RESULTS* Echocardiogram 2D Echocardiogram has been performed.  Jimmy Olson 07/16/2020, 9:24 AM

## 2020-07-17 ENCOUNTER — Inpatient Hospital Stay: Payer: BC Managed Care – PPO

## 2020-07-17 DIAGNOSIS — R778 Other specified abnormalities of plasma proteins: Secondary | ICD-10-CM | POA: Diagnosis not present

## 2020-07-17 DIAGNOSIS — N179 Acute kidney failure, unspecified: Secondary | ICD-10-CM | POA: Diagnosis not present

## 2020-07-17 DIAGNOSIS — I5023 Acute on chronic systolic (congestive) heart failure: Secondary | ICD-10-CM

## 2020-07-17 DIAGNOSIS — I214 Non-ST elevation (NSTEMI) myocardial infarction: Secondary | ICD-10-CM

## 2020-07-17 DIAGNOSIS — N170 Acute kidney failure with tubular necrosis: Secondary | ICD-10-CM

## 2020-07-17 DIAGNOSIS — F332 Major depressive disorder, recurrent severe without psychotic features: Secondary | ICD-10-CM | POA: Diagnosis not present

## 2020-07-17 LAB — HEPARIN LEVEL (UNFRACTIONATED)
Heparin Unfractionated: 0.51 IU/mL (ref 0.30–0.70)
Heparin Unfractionated: 0.37 IU/mL (ref 0.30–0.70)

## 2020-07-17 LAB — CBC
HCT: 34.8 % — ABNORMAL LOW (ref 39.0–52.0)
Hemoglobin: 12.1 g/dL — ABNORMAL LOW (ref 13.0–17.0)
MCH: 30.2 pg (ref 26.0–34.0)
MCHC: 34.8 g/dL (ref 30.0–36.0)
MCV: 86.8 fL (ref 80.0–100.0)
Platelets: 248 10*3/uL (ref 150–400)
RBC: 4.01 MIL/uL — ABNORMAL LOW (ref 4.22–5.81)
RDW: 12 % (ref 11.5–15.5)
WBC: 7.9 10*3/uL (ref 4.0–10.5)
nRBC: 0 % (ref 0.0–0.2)

## 2020-07-17 LAB — RETICULOCYTES
Immature Retic Fract: 11 % (ref 2.3–15.9)
RBC.: 3.39 MIL/uL — ABNORMAL LOW (ref 4.22–5.81)
Retic Count, Absolute: 46.1 10*3/uL (ref 19.0–186.0)
Retic Ct Pct: 1.4 % (ref 0.4–3.1)

## 2020-07-17 LAB — IRON AND TIBC
Iron: 87 ug/dL (ref 45–182)
Saturation Ratios: 30 % (ref 17.9–39.5)
TIBC: 295 ug/dL (ref 250–450)
UIBC: 208 ug/dL

## 2020-07-17 LAB — BASIC METABOLIC PANEL
Anion gap: 8 (ref 5–15)
BUN: 55 mg/dL — ABNORMAL HIGH (ref 6–20)
CO2: 29 mmol/L (ref 22–32)
Calcium: 9.4 mg/dL (ref 8.9–10.3)
Chloride: 106 mmol/L (ref 98–111)
Creatinine, Ser: 3.41 mg/dL — ABNORMAL HIGH (ref 0.61–1.24)
GFR, Estimated: 20 mL/min — ABNORMAL LOW (ref >60)
Glucose, Bld: 94 mg/dL (ref 70–99)
Potassium: 4 mmol/L (ref 3.5–5.1)
Sodium: 143 mmol/L (ref 135–145)

## 2020-07-17 LAB — TROPONIN I (HIGH SENSITIVITY): Troponin I (High Sensitivity): 133 ng/L (ref <18)

## 2020-07-17 LAB — GLUCOSE, CAPILLARY
Glucose-Capillary: 144 mg/dL — ABNORMAL HIGH (ref 70–99)
Glucose-Capillary: 274 mg/dL — ABNORMAL HIGH (ref 70–99)
Glucose-Capillary: 137 mg/dL — ABNORMAL HIGH (ref 70–99)
Glucose-Capillary: 244 mg/dL — ABNORMAL HIGH (ref 70–99)

## 2020-07-17 LAB — FOLATE: Folate: 14.9 ng/mL (ref >5.9)

## 2020-07-17 LAB — SODIUM, URINE, RANDOM: Sodium, Ur: 66 mmol/L

## 2020-07-17 LAB — CREATININE, URINE, RANDOM: Creatinine, Urine: 82 mg/dL

## 2020-07-17 LAB — FERRITIN: Ferritin: 63 ng/mL (ref 24–336)

## 2020-07-17 MED ORDER — HYDRALAZINE HCL 50 MG PO TABS
50.0000 mg | ORAL_TABLET | Freq: Two times a day (BID) | ORAL | Status: DC
  Administered 2020-07-18: 50 mg via ORAL
  Filled 2020-07-17: qty 1

## 2020-07-17 MED ORDER — SODIUM CHLORIDE 0.9 % IV SOLN: 250.0000 mL | INTRAVENOUS | Status: DC | PRN

## 2020-07-17 MED ORDER — ISOSORBIDE MONONITRATE ER 60 MG PO TB24
60.0000 mg | ORAL_TABLET | Freq: Every day | ORAL | Status: DC
  Administered 2020-07-18: 60 mg via ORAL
  Filled 2020-07-17: qty 1

## 2020-07-17 MED ORDER — SODIUM CHLORIDE 0.9 % IV BOLUS
500.0000 mL | Freq: Once | INTRAVENOUS | Status: AC
  Administered 2020-07-17: 500 mL via INTRAVENOUS

## 2020-07-17 MED ORDER — ROSUVASTATIN CALCIUM 10 MG PO TABS
40.0000 mg | ORAL_TABLET | Freq: Every day | ORAL | Status: DC
  Administered 2020-07-17 – 2020-07-19 (×4): 40 mg via ORAL
  Filled 2020-07-17 (×3): qty 4

## 2020-07-17 NOTE — Progress Notes (Signed)
ANTICOAGULATION CONSULT NOTE  Pharmacy Consult for Heparin  Indication: chest pain/ACS  No Known Allergies  Patient Measurements: Height: '5\' 5"'$  (165.1 cm) Weight: 71.4 kg (157 lb 4.8 oz) IBW/kg (Calculated) : 61.5 Heparin Dosing Weight: 70.2 kg   Vital Signs: Temp: 98.2 F (36.8 C) (01/29 0509) Temp Source: Oral (01/29 0509) BP: 139/81 (01/29 0509) Pulse Rate: 70 (01/29 0509)  Labs: Recent Labs    07/15/20 0359 07/15/20 0535 07/15/20 1124 07/15/20 1943 07/15/20 2240 07/16/20 0532 07/16/20 2112 07/17/20 0615  HGB 11.9*  --   --   --   --  11.7*  --  12.1*  HCT 34.6*  --   --   --   --  33.7*  --  34.8*  PLT 223  --   --   --   --  232  --  248  HEPARINUNFRC  --   --   --   --   --   --  0.19* 0.51  CREATININE 3.00*  --   --   --   --  2.96*  --  3.41*  TROPONINIHS 124*   < > 176* 190* 200*  --   --   --    < > = values in this interval not displayed.    Estimated Creatinine Clearance: 20.8 mL/min (A) (by C-G formula based on SCr of 3.41 mg/dL (H)).   Medical History: Past Medical History:  Diagnosis Date  . Cardiomyopathy ([place])    a. 07/2009 MV: EF 48%; b. 08/2019 Echo: EF 45-50%. Global HK. Mod LVH. Gr1 DD. Nl RV fxn. Nl PASP.  Marland Kitchen Chest pain    a. 07/2009 MV: EF 48%. No ischemia.  . Diabetes mellitus without complication ([place])   . HFmrEF (heart failure with mildy reduced ejection fraction) ([place])   . Hypertension     Assessment: Pharmacy consulted to dose heparin in this 60 year old male admitted with ACS/NSTEMI.  CrCl = 24 ml/min No prior anticoag noted.   Goal of Therapy:  Heparin level 0.3-0.7 units/ml Monitor platelets by anticoagulation protocol: Yes   1/28 2112  HL 0.19, subtherapeutic  Plan:  1/29 0615 HL 0.51, therapeutic  Continue heparin drip at 1100 units/hr. Recheck HL at 1400 to confirm. CBC daily while on heparin drip.  Tawnya Crook, PharmD 07/17/2020,7:29 AM

## 2020-07-17 NOTE — Progress Notes (Signed)
Progress Note  Patient Name: Jimmy Olson Date of Encounter: 07/17/2020  [facility] Cardiologist: Ida Rogue, MD   Subjective   Reports current chest pain that radiates into his back.   Chest pain is rated as mild in severity.   Denies chest pain overnight.  Reports he slept well.  No associated shortness of breath. Reports cough with productive white phlegm earlier this morning.  Reports gassiness but denies abdominal pain, nausea, and emesis.  Discussed cardiac catheterization, renal function.  Inpatient Medications    Scheduled Meds: . amLODipine  5 mg Oral Daily  . aspirin EC  81 mg Oral Daily  . bisoprolol  10 mg Oral Daily  . citalopram  20 mg Oral Daily  . feeding supplement (GLUCERNA SHAKE)  237 mL Oral BID BM  . furosemide  40 mg Intravenous Q12H  . insulin aspart  0-5 Units Subcutaneous QHS  . insulin aspart  0-9 Units Subcutaneous TID WC  . insulin detemir  30 Units Subcutaneous BID  . isosorbide mononitrate  30 mg Oral Daily  . multivitamin with minerals  1 tablet Oral Daily  . pantoprazole  40 mg Oral Daily  . rosuvastatin  10 mg Oral Daily  . sodium chloride flush  3 mL Intravenous Q12H   Continuous Infusions: . sodium chloride    . heparin 1,100 Units/hr (07/16/20 2258)   PRN Meds: sodium chloride, acetaminophen, albuterol, alum & mag hydroxide-simeth, calcium carbonate, dextromethorphan-guaiFENesin, hydrALAZINE, nitroGLYCERIN, ondansetron (ZOFRAN) IV, ondansetron, sodium chloride flush   Vital Signs    Vitals:   07/16/20 1529 07/16/20 2005 07/17/20 0509 07/17/20 0837  BP: (!) 146/81 (!) 157/87 139/81 131/75  Pulse: 70 77 70 71  Resp:  '19 18 18  '$ Temp: 98.7 F (37.1 C) 97.9 F (36.6 C) 98.2 F (36.8 C) 98 F (36.7 C)  TempSrc: Oral Oral Oral   SpO2: 99% 97% 96% 100%  Weight:   71.4 kg   Height:        Intake/Output Summary (Last 24 hours) at 07/17/2020 0907 Last data filed at 07/17/2020 0500 Gross per 24 hour  Intake 617.99 ml   Output 203 ml  Net 414.99 ml   Last 3 Weights 07/17/2020 07/15/2020 03/06/2020  Weight (lbs) 157 lb 4.8 oz 154 lb 12.2 oz 158 lb 11.7 oz  Weight (kg) 71.351 kg 70.2 kg 72 kg      Telemetry    NSR - Personally Reviewed  ECG    No new tracings - Personally Reviewed  Physical Exam   GEN: No acute distress.   Neck: No JVD Cardiac: RRR, no murmurs, rubs, or gallops.  Respiratory: Clear to auscultation bilaterally. GI: Soft, nontender, non-distended  MS: No edema; No deformity. Neuro:  Nonfocal  Psych: Normal affect   Labs    High Sensitivity Troponin:   Recent Labs  Lab 07/15/20 0359 07/15/20 0535 07/15/20 1124 07/15/20 1943 07/15/20 2240  TROPONINIHS 124* 127* 176* 190* 200*      Chemistry Recent Labs  Lab 07/15/20 0359 07/16/20 0532 07/17/20 0615  NA 141 144 143  K 3.7 3.4* 4.0  CL 108 106 106  CO2 '25 29 29  '$ GLUCOSE 245* 145* 94  BUN 42* 46* 55*  CREATININE 3.00* 2.96* 3.41*  CALCIUM 8.5* 8.8* 9.4  PROT 6.3*  --   --   ALBUMIN 3.0*  --   --   AST 18  --   --   ALT 15  --   --   [facility]  81  --   --   BILITOT 0.5  --   --   GFRNONAA 23* 24* 20*  ANIONGAP '8 9 8     '$ Hematology Recent Labs  Lab 07/15/20 0359 07/16/20 0532 07/17/20 0615  WBC 6.0 7.8 7.9  RBC 3.98* 3.91* 4.01*  HGB 11.9* 11.7* 12.1*  HCT 34.6* 33.7* 34.8*  MCV 86.9 86.2 86.8  MCH 29.9 29.9 30.2  MCHC 34.4 34.7 34.8  RDW 11.9 12.2 12.0  PLT 223 232 248    BNP Recent Labs  Lab 07/15/20 0359  BNP 834.1*     DDimer No results for input(s): DDIMER in the last 168 hours.   Radiology    ECHOCARDIOGRAM COMPLETE  Result Date: 07/16/2020    ECHOCARDIOGRAM REPORT   Patient Name:   Jimmy Olson Date of Exam: 07/16/2020 Medical Rec #:  [id number]     Height:       65.0 in Accession #:    [id number]    Weight:       154.8 lb Date of Birth:  07/14/1960     BSA:          1.774 m Patient Age:    60 years      BP:           152/92 mmHg Patient Gender: M             HR:           69 bpm.  Exam Location:  [facility] Procedure: 2D Echo, Color Doppler, Cardiac Doppler and Strain Analysis Indications:     [id number] CHF-Acute Systolic  History:         Patient has prior history of Echocardiogram examinations, most                  recent 09/11/2019. HFpEF and Cardiomyopathy; Risk                  Factors:Hypertension and Diabetes. Pt had COVID-19 01/2020.  Sonographer:     Charmayne Sheer RDCS (AE) Referring Phys:  [id number] Ivor Costa Diagnosing Phys: Ida Rogue MD  Sonographer Comments: Global longitudinal strain was attempted. IMPRESSIONS  1. Left ventricular ejection fraction, by estimation, is 30 to 35%. The left ventricle has moderately decreased function. The left ventricle demonstrates global hypokinesis. The left ventricular internal cavity size was moderately dilated. There is mild  left ventricular hypertrophy. Left ventricular diastolic parameters are consistent with Grade II diastolic dysfunction (pseudonormalization). The average left ventricular global longitudinal strain is -10.1 %. The global longitudinal strain is abnormal.  2. Right ventricular systolic function is normal. The right ventricular size is normal. Tricuspid regurgitation signal is inadequate for assessing PA pressure.  3. Left atrial size was mildly dilated.  4. The inferior vena cava is normal in size with greater than 50% respiratory variability, suggesting right atrial pressure of 3 mmHg. FINDINGS  Left Ventricle: Left ventricular ejection fraction, by estimation, is 30 to 35%. The left ventricle has moderately decreased function. The left ventricle demonstrates global hypokinesis. The average left ventricular global longitudinal strain is -10.1 %. The global longitudinal strain is abnormal. The left ventricular internal cavity size was moderately dilated. There is mild left ventricular hypertrophy. Left ventricular diastolic parameters are consistent with Grade II diastolic dysfunction (pseudonormalization). Right Ventricle: The right  ventricular size is normal. No increase in right ventricular wall thickness. Right ventricular systolic function is normal. Tricuspid regurgitation signal is inadequate for assessing PA pressure. Left Atrium: Left atrial  size was mildly dilated. Right Atrium: Right atrial size was normal in size. Pericardium: There is no evidence of pericardial effusion. Mitral Valve: The mitral valve is normal in structure. Mild mitral valve regurgitation. No evidence of mitral valve stenosis. MV peak gradient, 2.8 mmHg. The mean mitral valve gradient is 2.0 mmHg. Tricuspid Valve: The tricuspid valve is normal in structure. Tricuspid valve regurgitation is not demonstrated. No evidence of tricuspid stenosis. Aortic Valve: The aortic valve was not well visualized. Aortic valve regurgitation is not visualized. No aortic stenosis is present. Aortic valve mean gradient measures 3.0 mmHg. Aortic valve peak gradient measures 5.1 mmHg. Aortic valve area, by VTI measures 2.34 cm. Pulmonic Valve: The pulmonic valve was normal in structure. Pulmonic valve regurgitation is not visualized. No evidence of pulmonic stenosis. Aorta: The aortic root is normal in size and structure. Venous: The inferior vena cava is normal in size with greater than 50% respiratory variability, suggesting right atrial pressure of 3 mmHg. IAS/Shunts: No atrial level shunt detected by color flow Doppler.  LEFT VENTRICLE PLAX 2D LVIDd:         5.50 cm      Diastology LVIDs:         4.50 cm      LV e' medial:    4.35 cm/s LV PW:         1.20 cm      LV E/e' medial:  19.8 LV IVS:        0.70 cm      LV e' lateral:   5.55 cm/s LVOT diam:     2.20 cm      LV E/e' lateral: 15.5 LV SV:         54 LV SV Index:   30           2D Longitudinal Strain LVOT Area:     3.80 cm     2D Strain GLS (A2C):   -10.5 %                             2D Strain GLS (A3C):   -10.4 %                             2D Strain GLS (A4C):   -9.3 % LV Volumes (MOD)            2D Strain GLS Avg:      -10.1 % LV vol d, MOD A2C: 170.0 ml LV vol d, MOD A4C: 169.0 ml LV vol s, MOD A2C: 103.0 ml LV vol s, MOD A4C: 101.0 ml LV SV MOD A2C:     67.0 ml LV SV MOD A4C:     169.0 ml LV SV MOD BP:      69.6 ml LEFT ATRIUM           Index LA diam:      4.00 cm 2.25 cm/m LA Vol (A4C): 47.0 ml 26.50 ml/m  AORTIC VALVE                   PULMONIC VALVE AV Area (Vmax):    2.37 cm    PV Vmax:       0.63 m/s AV Area (Vmean):   2.48 cm    PV Vmean:      44.500 cm/s AV Area (VTI):     2.34 cm    PV VTI:  0.124 m AV Vmax:           113.00 cm/s PV Peak grad:  1.6 mmHg AV Vmean:          79.000 cm/s PV Mean grad:  1.0 mmHg AV VTI:            0.229 m AV Peak Grad:      5.1 mmHg AV Mean Grad:      3.0 mmHg LVOT Vmax:         70.50 cm/s LVOT Vmean:        51.600 cm/s LVOT VTI:          0.141 m LVOT/AV VTI ratio: 0.62  AORTA Ao Root diam: 3.00 cm MITRAL VALVE MV Area (PHT): 3.20 cm    SHUNTS MV Area VTI:   2.07 cm    Systemic VTI:  0.14 m MV Peak grad:  2.8 mmHg    Systemic Diam: 2.20 cm MV Mean grad:  2.0 mmHg MV Vmax:       0.84 m/s MV Vmean:      61.0 cm/s MV Decel Time: 237 msec MV E velocity: 86.10 cm/s MV A velocity: 60.40 cm/s MV E/A ratio:  1.43 Ida Rogue MD Electronically signed by Ida Rogue MD Signature Date/Time: 07/16/2020/11:09:56 AM    Final     Cardiac Studies   Echocardiogram 07/16/2020 1. Left ventricular ejection fraction, by estimation, is 30 to 35%. The  left ventricle has moderately decreased function. The left ventricle  demonstrates global hypokinesis. The left ventricular internal cavity size  was moderately dilated. There is mild  left ventricular hypertrophy. Left ventricular diastolic parameters are  consistent with Grade II diastolic dysfunction (pseudonormalization). The  average left ventricular global longitudinal strain is -10.1 %. The global  longitudinal strain is abnormal.  2. Right ventricular systolic function is normal. The right ventricular  size is normal.  Tricuspid regurgitation signal is inadequate for assessing  PA pressure.  3. Left atrial size was mildly dilated.  4. The inferior vena cava is normal in size with greater than 50%  respiratory variability, suggesting right atrial pressure of 3 mmHg.   MPI 07/2009  1. Normal treadmill EKG without evidence of ischemia or arrhythmia.  2. Good exercise tolerance for age.  3. Normal LV systolic function. Ejection fraction 48%.  4. Normal myocardial perfusion without evidence of myocardial  ischemia.   Patient Profile     60 y.o. male  with a hx of previous LV dysfunction with most recent 07/16/20 echo LVEF decreased to 30-35%, history of COVID-19 infection 03/06/2020, poorly controlled hypertension, hyperlipidemia, DM2, CKD, and who is being seen today for the evaluation of newly reduced EF at the request of Dr. Karleen Hampshire.  Assessment & Plan    1. NSTEMI --Reports almost constant chest pain since his previous 08/2019 admission. LVEF 30-35% by most recent echo.  HS Tn 124  200. Considered is supply demand ischemia given marked hypertension. However, given newly reduced EF and chest pain as outlined above, further ischemic work-up recommended at this time. Risk factors for cardiac etiology include male, recently reduced EF without recent ischemic work-up, poorly controlled DM2, hypertension.  Given renal function, not an ideal candidate for LHC; however, per MD 1/28, benefits outweigh risks and schedule for LHC.   Continue to cycle HS Tn. Serial EKGs.  Continue IV heparin over the weekend.  Daily CBC.    Daily BMET.  Continue ASA 81 mg daily, bisoprolol 10 mg daily, Crestor 10 mg daily,  and SL nitro for CP.   No ACE/ARB/Arni/MRA given AOCKD.    Recommend further ischemic workup given reduced EF.    Given renal function, not an ideal candidate for LHC.  Cr 3.41 with BUN 55.  Discussed with MD rounding on 1/28, Dr. Rockey Situ, given renal function and with recommendation  to proceed with LHC on 1/31. Recommendation per rounding MD 1/28 was for [facility] over MPI.  NPO after midnight 1/30.   2. AOC HFrEF --Reports shortness of breath with exertion.  Previously noted to have hypertensive heart dz. Most recent EF decreased to from 45-50% to 30 to 35% with GLS abnl at -10.1%. LHC recommended as above per MD. Also noted was mild LVH, mild LVH, G2 DD, mild LAE.  BNP elevated at 834.1. Started on IV lasix with low output 1/28 and subsequent bump in Cr.   Discontinued IV diuresis with furosemide 40 mg q12h given further bump in Cr as outlined below. Euvolemic on exam. Will discuss with MD.   . Consider nephrology consult.  Caution / recommend against fluids given reduced EF.  Current renal function prevents escalation of GDMT.   ReDS vest  Monitor I's/O's, daily standing weights.  Net +415 yesterday with wt 70.2kg  71.4kg.   Given most recent decline in EF, further ischemic work-up recommended as above.  3. HTN, poorly controlled --BP significantly elevated at presentation at 170/104. Improved by most recent vitals to 131/75.Marland Kitchen  Continue amlodipine 5 mg daily, bisoprolol 10 mg daily, PRN hydralazine, and Imdur.    Discontinued IV diuresis given bump in Cr.   Titrate antihypertensives as needed for optimize BP control.    No ACE/ARB/Arni 2/2 renal function.  5. AOCKD --Cr 2.96 .arrrow 3.41, BUN 46  55.    Caution with nephrotoxins.   Discontinued IV diuresis for now.  No ACE/ARB/Arni 2/2 renal function.  Recommend nephrology consultation.  6. Anemia, likely of chronic dz --Daily CBC on IV heparin.  --H&H stable.  7. Hypertriglyceridemia --Tg 178. Continue current statin for risk factor modification.  If triglycerides remain elevated at recheck, consider Vascepa.  8. HLD --LDL 110. Continue current statin for risk factor modification. Titrate as needed for LDL  Below 70.  9. DM2 / Hyperglycemia --Poorly controlled A1C 9.7. --Continue  SSI. --Glycemic control recommended for risk factor modification. --Per IM, PCP.    For questions or updates, please contact [place] Please consult [email or web address] for contact info under        Signed, Arvil Chaco, PA-C  07/17/2020, 9:07 AM

## 2020-07-17 NOTE — Progress Notes (Signed)
[facility] NAME: Jimmy Olson    MR#:  [id number]  DATE OF BIRTH:  Jan 13, 1961  SUBJECTIVE:   Patient seen via [place] radio interpreter. Complains of mild chest pain on and off appears continuous. He also complains of some abdominal pain. Has issues with constipation and hard stools. Tolerating some PO diet. No vomiting. REVIEW OF SYSTEMS:   Review of Systems  Constitutional: Negative for chills, fever and weight loss.  HENT: Negative for ear discharge, ear pain and nosebleeds.   Eyes: Negative for blurred vision, pain and discharge.  Respiratory: Positive for shortness of breath. Negative for sputum production, wheezing and stridor.   Cardiovascular: Positive for chest pain. Negative for palpitations, orthopnea and PND.  Gastrointestinal: Positive for abdominal pain and constipation. Negative for diarrhea, nausea and vomiting.  Genitourinary: Negative for frequency and urgency.  Musculoskeletal: Negative for back pain and joint pain.  Neurological: Negative for sensory change, speech change, focal weakness and weakness.  Psychiatric/Behavioral: Negative for depression and hallucinations. The patient is not nervous/anxious.    Tolerating Diet:yesTolerating PT:   DRUG ALLERGIES:  No Known Allergies  VITALS:  Blood pressure 131/75, pulse 71, temperature 98 F (36.7 C), resp. rate 18, height '5\' 5"'$  (1.651 m), weight 71.4 kg, SpO2 100 %.  PHYSICAL EXAMINATION:   Physical Exam  General exam: Appears calm and comfortable  Respiratory system: basilar crackles and on 2 lit of [place] oxygen, rhonchi wheezing no respiratory distress noted. Cardiovascular system: S1 & S2 heard, RRR. No JVD, . No pedal edema. Gastrointestinal system: Abdomen is nondistended, soft and nontender.. Normal bowel sounds heard. Central nervous system: Alert and oriented. No focal neurological deficits. Extremities: Symmetric 5 x 5 power. Skin: No rashes, lesions  or ulcers Psychiatry:  Mood & affect appropriate.  LABORATORY PANEL:  CBC Recent Labs  Lab 07/17/20 0615  WBC 7.9  HGB 12.1*  HCT 34.8*  PLT 248    Chemistries  Recent Labs  Lab 07/15/20 0359 07/16/20 0532 07/17/20 0615  NA 141 144 143  K 3.7 3.4* 4.0  CL 108 106 106  CO2 '25 29 29  '$ GLUCOSE 245* 145* 94  BUN 42* 46* 55*  CREATININE 3.00* 2.96* 3.41*  CALCIUM 8.5* 8.8* 9.4  MG  --  1.9  --   AST 18  --   --   ALT 15  --   --   ALKPHOS 81  --   --   BILITOT 0.5  --   --    Cardiac Enzymes No results for input(s): TROPONINI in the last 168 hours. RADIOLOGY:  ECHOCARDIOGRAM COMPLETE  Result Date: 07/16/2020    ECHOCARDIOGRAM REPORT   Patient Name:   Jimmy Olson Date of Exam: 07/16/2020 Medical Rec #:  [id number]     Height:       65.0 in Accession #:    [id number]    Weight:       154.8 lb Date of Birth:  10-May-1961     BSA:          1.774 m Patient Age:    60 years      BP:           152/92 mmHg Patient Gender: M             HR:           69 bpm. Exam Location:  [facility] Procedure: 2D Echo, Color Doppler, Cardiac Doppler and Strain Analysis Indications:  [id number] CHF-Acute Systolic  History:         Patient has prior history of Echocardiogram examinations, most                  recent 09/11/2019. HFpEF and Cardiomyopathy; Risk                  Factors:Hypertension and Diabetes. Pt had COVID-19 01/2020.  Sonographer:     Charmayne Sheer RDCS (AE) Referring Phys:  [id number] Ivor Costa Diagnosing Phys: Ida Rogue MD  Sonographer Comments: Global longitudinal strain was attempted. IMPRESSIONS  1. Left ventricular ejection fraction, by estimation, is 30 to 35%. The left ventricle has moderately decreased function. The left ventricle demonstrates global hypokinesis. The left ventricular internal cavity size was moderately dilated. There is mild  left ventricular hypertrophy. Left ventricular diastolic parameters are consistent with Grade II diastolic dysfunction (pseudonormalization). The average  left ventricular global longitudinal strain is -10.1 %. The global longitudinal strain is abnormal.  2. Right ventricular systolic function is normal. The right ventricular size is normal. Tricuspid regurgitation signal is inadequate for assessing PA pressure.  3. Left atrial size was mildly dilated.  4. The inferior vena cava is normal in size with greater than 50% respiratory variability, suggesting right atrial pressure of 3 mmHg. FINDINGS  Left Ventricle: Left ventricular ejection fraction, by estimation, is 30 to 35%. The left ventricle has moderately decreased function. The left ventricle demonstrates global hypokinesis. The average left ventricular global longitudinal strain is -10.1 %. The global longitudinal strain is abnormal. The left ventricular internal cavity size was moderately dilated. There is mild left ventricular hypertrophy. Left ventricular diastolic parameters are consistent with Grade II diastolic dysfunction (pseudonormalization). Right Ventricle: The right ventricular size is normal. No increase in right ventricular wall thickness. Right ventricular systolic function is normal. Tricuspid regurgitation signal is inadequate for assessing PA pressure. Left Atrium: Left atrial size was mildly dilated. Right Atrium: Right atrial size was normal in size. Pericardium: There is no evidence of pericardial effusion. Mitral Valve: The mitral valve is normal in structure. Mild mitral valve regurgitation. No evidence of mitral valve stenosis. MV peak gradient, 2.8 mmHg. The mean mitral valve gradient is 2.0 mmHg. Tricuspid Valve: The tricuspid valve is normal in structure. Tricuspid valve regurgitation is not demonstrated. No evidence of tricuspid stenosis. Aortic Valve: The aortic valve was not well visualized. Aortic valve regurgitation is not visualized. No aortic stenosis is present. Aortic valve mean gradient measures 3.0 mmHg. Aortic valve peak gradient measures 5.1 mmHg. Aortic valve area, by VTI  measures 2.34 cm. Pulmonic Valve: The pulmonic valve was normal in structure. Pulmonic valve regurgitation is not visualized. No evidence of pulmonic stenosis. Aorta: The aortic root is normal in size and structure. Venous: The inferior vena cava is normal in size with greater than 50% respiratory variability, suggesting right atrial pressure of 3 mmHg. IAS/Shunts: No atrial level shunt detected by color flow Doppler.  LEFT VENTRICLE PLAX 2D LVIDd:         5.50 cm      Diastology LVIDs:         4.50 cm      LV e' medial:    4.35 cm/s LV PW:         1.20 cm      LV E/e' medial:  19.8 LV IVS:        0.70 cm      LV e' lateral:   5.55 cm/s LVOT diam:  2.20 cm      LV E/e' lateral: 15.5 LV SV:         54 LV SV Index:   30           2D Longitudinal Strain LVOT Area:     3.80 cm     2D Strain GLS (A2C):   -10.5 %                             2D Strain GLS (A3C):   -10.4 %                             2D Strain GLS (A4C):   -9.3 % LV Volumes (MOD)            2D Strain GLS Avg:     -10.1 % LV vol d, MOD A2C: 170.0 ml LV vol d, MOD A4C: 169.0 ml LV vol s, MOD A2C: 103.0 ml LV vol s, MOD A4C: 101.0 ml LV SV MOD A2C:     67.0 ml LV SV MOD A4C:     169.0 ml LV SV MOD BP:      69.6 ml LEFT ATRIUM           Index LA diam:      4.00 cm 2.25 cm/m LA Vol (A4C): 47.0 ml 26.50 ml/m  AORTIC VALVE                   PULMONIC VALVE AV Area (Vmax):    2.37 cm    PV Vmax:       0.63 m/s AV Area (Vmean):   2.48 cm    PV Vmean:      44.500 cm/s AV Area (VTI):     2.34 cm    PV VTI:        0.124 m AV Vmax:           113.00 cm/s PV Peak grad:  1.6 mmHg AV Vmean:          79.000 cm/s PV Mean grad:  1.0 mmHg AV VTI:            0.229 m AV Peak Grad:      5.1 mmHg AV Mean Grad:      3.0 mmHg LVOT Vmax:         70.50 cm/s LVOT Vmean:        51.600 cm/s LVOT VTI:          0.141 m LVOT/AV VTI ratio: 0.62  AORTA Ao Root diam: 3.00 cm MITRAL VALVE MV Area (PHT): 3.20 cm    SHUNTS MV Area VTI:   2.07 cm    Systemic VTI:  0.14 m MV Peak grad:   2.8 mmHg    Systemic Diam: 2.20 cm MV Mean grad:  2.0 mmHg MV Vmax:       0.84 m/s MV Vmean:      61.0 cm/s MV Decel Time: 237 msec MV E velocity: 86.10 cm/s MV A velocity: 60.40 cm/s MV E/A ratio:  1.43 Ida Rogue MD Electronically signed by Ida Rogue MD Signature Date/Time: 07/16/2020/11:09:56 AM    Final    ASSESSMENT AND PLAN:  Jacelyn Grip a 60 y.o.malewith medical history significant ofsCHF withEF of 45 to 50%, hypertension, hyperlipidemia, diabetes mellitus, COVID-19 infection 4 months ago, CKD 3B, who presents with shortness of breath.  Patient reports having shortness  of breath for more than 2 days associated with intermittent chest pain and chest tightness.  Chest x-ray shows interstitial pulmonary edema with possible small right pleural effusion.  Patient admitted for acute on chronic systolic and diastolic heart failure.  Acute respiratory Failure with hypoxia Acute on chronic systolic and diastolic heart failure.  -- requiring up to 2 lit of [place] oxygen to keep sats greater than 90%.  --on IV lasix 40 mg BID ,  --Cardiology [facility] consulted for recommendations.-- Recommends ischemic workup with left heart catheterization plan for Monday 1/31 -- strict intake and output. Daily weights.  --Monitor renal parameters while on lasix.  --2 d echo shows worsening of the left ventricular EF form 40 to 45% to 30 to 35%.  With grade 2 diastolic dysfunction.  NSTEMI --Intermittent chest pain and elevated troponins.  --- on IV heparin, continue with aspirin and imdur.  --EKG shows t wave inversions in the lateral leads and minimal ST elevation.   Type 2 DM:  a1C IS 9.7% CBG (last 3)  Recent Labs (last 2 labs)        Recent Labs    07/15/20 2223 07/16/20 0733 07/16/20 1145  GLUCAP 134* 153* 207*     Resume levemir and SSI.   Hypokalemia:  Replace.   AKI: superimposed on CKD stage IIIB Baseline creatinine appears to be between 1.8 to 2.  Admitted with a  creatinine of 3 and creatinine REMAINS AT 3.41  nephrology consultation with Dr. Theador Hawthorne  Hyperlipidemia:  LDL is 110. Increase crestor to 20 mg daily.   Hypertension:  Bp ok   DVT prophylaxis: heparin Code Status: (Fullcode ) Family Communication: none at bedside.  Disposition: home  Status is: Inpatient  Remains inpatient appropriate because:Ongoing diagnostic testing needed not appropriate for outpatient work up, Unsafe d/c plan, IV treatments appropriate due to intensity of illness or inability to take PO and Inpatient level of care appropriate due to severity of illness   Dispo: The patient is from: Home  Anticipated d/c is to: Home  Anticipated d/c date is: > 3 days  Patient currently is not medically stable to d/c.              Difficult to place patient No  Level of care: Progressive Cardiac  Needs cardiac cath Nephrology consulted      TOTAL TIME TAKING CARE OF THIS PATIENT: 25* minutes.  >50% time spent on counselling and coordination of care  Note: This dictation was prepared with Dragon dictation along with smaller phrase technology. Any transcriptional errors that result from this process are unintentional.  Fritzi Mandes M.D    [facility]   CC: Primary care physician; [facility]Patient ID: FEDERICO SCHARRER, male   DOB: 1961/02/20, 60 y.o.   MRN: [id number]

## 2020-07-17 NOTE — Consult Note (Signed)
[name] MRN: [id number] DOB/AGE: 06-25-1960 60 y.o. Primary Care Physician:[facility] [place] date: 07/15/2020 Chief Complaint:  Chief Complaint  Patient presents with  . Shortness of Breath   HPI:   Patient is a 60 year old male with a past medical history of diabetes mellitus, hypertension, CHF who came to the ER with chief complaint of shortness of breath   History of present illness dates back to 3 to 4 days ago when patient started having shortness of breath, it was progressive in nature. Patient later also developed orthopnea Patient son then called EMS and patient was brought to the ER Upon evaluation in the ER patient was found to have  BNP 835, worsening renal function, troponin level 124, 127, negative Covid PCR.  Patient had Chest x-ray  It  showed interstitial pulmonary edema with possible small right pleural effusion.  Patient is admitted for further care.  Patient continued to have chest pain and cardiology was consulted. Patient is to possibly undergo left heart cath secondary to NSTEMI. Nephrology was consulted, Patient seen today Patient does continue to complain of chest pain Patient also says shortness of breath is better than before No complaint of nausea/vomiting No complaint of fever cough or chills No complaint of frequency urgency dysuria No complaint of hematuria No complaint of over-the-counter NSAID use   Past Medical History:  Diagnosis Date  . Cardiomyopathy ([place])    a. 07/2009 MV: EF 48%; b. 08/2019 Echo: EF 45-50%. Global HK. Mod LVH. Gr1 DD. Nl RV fxn. Nl PASP.  Marland Kitchen Chest pain    a. 07/2009 MV: EF 48%. No ischemia.  . Diabetes mellitus without complication ([place])   . HFmrEF (heart failure with mildy reduced ejection fraction) ([place])   . Hypertension    Pt has hx of DM from past 69 -20 years     Family History  Problem Relation Age of Onset  . Kidney failure Mother        died @ 38  . Heart failure Mother   . Other  Father        he never knew his father    Social History:  reports that he has never smoked. He has never used smokeless tobacco. He reports that he does not drink alcohol and does not use drugs.   Allergies: No Known Allergies  Medications Prior to Admission  Medication Sig Dispense Refill  . insulin detemir (LEVEMIR) 100 UNIT/ML FlexPen Inject 20 Units into the skin 2 (two) times daily. (Patient taking differently: Inject 45 Units into the skin 2 (two) times daily.) 15 mL 1  . insulin lispro (HUMALOG) 100 UNIT/ML KwikPen Inject 0.03 mLs (3 Units total) into the skin 3 (three) times daily with meals. Do not take if you do not eat more than 50% of meal (Patient taking differently: Inject 5 Units into the skin 3 (three) times daily with meals. Do not take if you do not eat more than 50% of meal) 15 mL 1  . isosorbide mononitrate (IMDUR) 30 MG 24 hr tablet Take 1 tablet (30 mg total) by mouth daily. 30 tablet 1  . lisinopril (ZESTRIL) 30 MG tablet Take 1 tablet (30 mg total) by mouth daily. 30 tablet 0  . rosuvastatin (CRESTOR) 10 MG tablet Take 10 mg by mouth daily.    . Vitamin D, Ergocalciferol, (DRISDOL) 1.25 MG (50000 UNIT) CAPS capsule Take 50,000 Units by mouth every Tuesday.    Marland Kitchen amLODipine (NORVASC) 10 MG tablet Take 1 tablet (  10 mg total) by mouth daily. (Patient not taking: Reported on 07/15/2020) 30 tablet 0  . bisoprolol (ZEBETA) 10 MG tablet Take 1 tablet (10 mg total) by mouth daily. (Patient not taking: Reported on 07/15/2020) 30 tablet 1  . hydrALAZINE (APRESOLINE) 50 MG tablet Take 1 tablet (50 mg total) by mouth 3 (three) times daily. (Patient not taking: No sig reported) 90 tablet 1  . nitroGLYCERIN (NITROSTAT) 0.4 MG SL tablet Place 1 tablet (0.4 mg total) under the tongue every 5 (five) minutes as needed for chest pain. (Patient not taking: No sig reported) 30 tablet 1       [id number] from the symptoms mentioned above,there are no other symptoms referable to all systems  reviewed.  Marland Kitchen amLODipine  5 mg Oral Daily  . aspirin EC  81 mg Oral Daily  . bisoprolol  10 mg Oral Daily  . citalopram  20 mg Oral Daily  . feeding supplement (GLUCERNA SHAKE)  237 mL Oral BID BM  . insulin aspart  0-5 Units Subcutaneous QHS  . insulin aspart  0-9 Units Subcutaneous TID WC  . insulin detemir  30 Units Subcutaneous BID  . isosorbide mononitrate  30 mg Oral Daily  . multivitamin with minerals  1 tablet Oral Daily  . pantoprazole  40 mg Oral Daily  . rosuvastatin  10 mg Oral Daily  . sodium chloride flush  3 mL Intravenous Q12H     Physical Exam: Vital signs in last 24 hours: Temp:  [97.9 F (36.6 C)-98.7 F (37.1 C)] 98 F (36.7 C) (01/29 0837) Pulse Rate:  [70-77] 71 (01/29 0837) Resp:  [18-20] 18 (01/29 0837) BP: (131-157)/(75-87) 131/75 (01/29 0837) SpO2:  [96 %-100 %] 100 % (01/29 0837) Weight:  [71.4 kg] 71.4 kg (01/29 0509) Weight change:  Last BM Date: 07/17/20  Intake/Output from previous day: 01/28 0701 - 01/29 0700 In: 618 [P.O.:480; I.V.:138] Out: 203 [Urine:203] No intake/output data recorded.   Physical Exam: General- pt is awake,alert, oriented to time place and person Resp- No acute REsp distress, CTA B/L NO Rhonchi CVS- S1S2 regular ij rate and rhythm GIT- BS+, soft, NT, ND EXT- NO LE Edema, Cyanosis CNS- CN 2-12 grossly intact. Moving all 4 extremities Psych- normal mood and affect   Lab Results: CBC Recent Labs    07/16/20 0532 07/17/20 0615  WBC 7.8 7.9  HGB 11.7* 12.1*  HCT 33.7* 34.8*  PLT 232 248    BMET Recent Labs    07/16/20 0532 07/17/20 0615  NA 144 143  K 3.4* 4.0  CL 106 106  CO2 29 29  GLUCOSE 145* 94  BUN 46* 55*  CREATININE 2.96* 3.41*  CALCIUM 8.8* 9.4    Creatinine trend 2022 3.0==> 3.4 2021 1.8--2.0   MICRO Recent Results (from the past 240 hour(s))  SARS Coronavirus 2 by RT PCR (hospital order, performed in [facility] hospital lab) Nasopharyngeal Nasopharyngeal Swab     Status: None    Collection Time: 07/15/20  3:59 AM   Specimen: Nasopharyngeal Swab  Result Value Ref Range Status   SARS Coronavirus 2 NEGATIVE NEGATIVE Final    Comment: (NOTE) SARS-CoV-2 target nucleic acids are NOT DETECTED.  The SARS-CoV-2 RNA is generally detectable in upper and lower respiratory specimens during the acute phase of infection. The lowest concentration of SARS-CoV-2 viral copies this assay can detect is 250 copies / mL. A negative result does not preclude SARS-CoV-2 infection and should not be used as the sole basis for treatment  or other patient management decisions.  A negative result may occur with improper specimen collection / handling, submission of specimen other than nasopharyngeal swab, presence of viral mutation(s) within the areas targeted by this assay, and inadequate number of viral copies (<250 copies / mL). A negative result must be combined with clinical observations, patient history, and epidemiological information.  Fact Sheet for Patients:   StrictlyIdeas.no  Fact Sheet for Healthcare Providers: BankingDealers.co.za  This test is not yet approved or  cleared by the [place] FDA and has been authorized for detection and/or diagnosis of SARS-CoV-2 by FDA under an Emergency Use Authorization (EUA).  This EUA will remain in effect (meaning this test can be used) for the duration of the COVID-19 declaration under Section 564(b)(1) of the Act, 21 U.S.C. section 360bbb-3(b)(1), unless the authorization is terminated or revoked sooner.  Performed at [facility], [address] [place], [place] [address]   Culture, blood (single)     Status: None (Preliminary result)   Collection Time: 07/15/20  3:59 AM   Specimen: BLOOD  Result Value Ref Range Status   Specimen Description BLOOD RIGHT FA  Final   Special Requests   Final    BOTTLES DRAWN AEROBIC AND ANAEROBIC Blood Culture results may not be  optimal due to an excessive volume of blood received in culture bottles   Culture   Final    NO GROWTH 2 DAYS Performed at [facility], [address] [place], [place] [address]    Report Status PENDING  Incomplete      Lab Results  Component Value Date   CALCIUM 9.4 07/17/2020    CXR done on January 27 IMPRESSION: Lower lung volumes with diffuse bilateral increased pulmonary interstitial opacity. Favor acute pulmonary edema, with suspected small volume pleural fluid on the right.  Chest x-ray done on January 29 COMPARISON:  07/15/2020  FINDINGS: Cardiomediastinal silhouette unchanged in size and contour.  Low lung volumes with slight improved aeration at the lung bases. No pneumothorax or pleural effusion.  IMPRESSION: Slight improved airspace opacity at the right lung base with persisting low lung volumes.    Renal ultrasound done on 29th Right Kidney:  Renal measurements: 11.1 x 5.5 x 5.2 cm = volume: 167 mL. Normal cortical thickness. Increased cortical echogenicity. No mass, hydronephrosis, or shadowing calcification.  Left Kidney:  Renal measurements: 10.5 x 5.8 x 7.3 cm = volume: 234 mL. Normal cortical thickness. Increased cortical echogenicity. No mass, hydronephrosis, or shadowing calcification.  Bladder:  Appears normal for degree of bladder distention.  Other:  Prostate gland enlarged 5.1 x 4.2 x 5.3 cm (volume = 59 cm^3)  IMPRESSION: Medical renal disease changes of both kidneys.  No evidence of renal mass or hydronephrosis.  Prostatic enlargement.  2D echo Left Ventricle: Left ventricular ejection fraction, by estimation, is 30  to 35%. The left ventricle has moderately decreased function. The left  ventricle demonstrates global hypokinesis. The average left ventricular  global longitudinal strain is -10.1  %. The global longitudinal strain is abnormal. The left ventricular  internal cavity size was moderately  dilated. There is mild left  ventricular hypertrophy. Left ventricular diastolic parameters are  consistent with Grade II diastolic dysfunction  (pseudonormalization).   Impression: 1)Renal   AKI secondary to ATN/cardiorenal/Post-obstructive AKI on CKD Patient has CKD stage IIIb Patient has CKD stage IIIb since 2021, most likely before that Patient has CKD secondary to diabetes mellitus Patient CKD progression has now been marked with AKI  2)HTN Blood pressure is at goal   3)Anemia of chronic disease HGb at goal (9--11) No need for Epogen for now We will check anemia profile  4) secondary hyperparathyroidism-CKD Mineral-Bone Disorder PTH not avail  secondary Hyperparathyroidism w/u pending  phosphorus will check  5) NSTEMI Primary team and cardiology following Patient is possibly scheduled to go for catheterization on Monday  6) electrolytes   Hypokalemia Now better   NOrmonatremic  7)Acid base Co2 at goal   8) diastolic CHF Patient is well compensated Cardiology is following No need for diuretic at this time  Plan:  Acute kidney injury Agree with holding RAS blockers  We will ask for UA In case it shows hematuria we will ask for autoimmune work-up  We will ask for renal ultrasound/reviewed Renal ultrasound was negative for any masses, did show prostate gland enlargement We will ask for post void residual  Fluid status We will ask for chest x-ray/reviewed Plan was to check chest x-ray to help with the fluid assessment. Upon discussion with the patient's cardiologist patient IVC is showing the signs of hypervolemia Patient chest x-ray is also showing the patient does not have any vascular congestion So we will start patient on IV fluids  Anemia We will ask for anemia work-up  Secondary hyperparathyroidism We will ask for secondary hyperparathyroidism  work-up  No need for renal replacement therapy today  We will  follow     Jimmy Olson s [facility] 07/17/2020, 9:57 AM

## 2020-07-17 NOTE — Progress Notes (Addendum)
ANTICOAGULATION CONSULT NOTE  Pharmacy Consult for Heparin  Indication: chest pain/ACS  No Known Allergies  Patient Measurements: Height: '5\' 5"'$  (165.1 cm) Weight: 71.4 kg (157 lb 4.8 oz) IBW/kg (Calculated) : 61.5 Heparin Dosing Weight: 70.2 kg   Vital Signs: Temp: 98.6 F (37 C) (01/29 1214) Temp Source: Oral (01/29 1214) BP: 131/67 (01/29 1214) Pulse Rate: 68 (01/29 1214)  Labs: Recent Labs    07/15/20 0359 07/15/20 0535 07/15/20 1943 07/15/20 2240 07/16/20 0532 07/16/20 2112 07/17/20 0615 07/17/20 1026 07/17/20 1445  HGB 11.9*  --   --   --  11.7*  --  12.1*  --   --   HCT 34.6*  --   --   --  33.7*  --  34.8*  --   --   PLT 223  --   --   --  232  --  248  --   --   HEPARINUNFRC  --   --   --   --   --  0.19* 0.51  --  0.37  CREATININE 3.00*  --   --   --  2.96*  --  3.41*  --   --   TROPONINIHS 124*   < > 190* 200*  --   --   --  133*  --    < > = values in this interval not displayed.    Estimated Creatinine Clearance: 20.8 mL/min (A) (by C-G formula based on SCr of 3.41 mg/dL (H)).   Medical History: Past Medical History:  Diagnosis Date  . Cardiomyopathy ([place])    a. 07/2009 MV: EF 48%; b. 08/2019 Echo: EF 45-50%. Global HK. Mod LVH. Gr1 DD. Nl RV fxn. Nl PASP.  Marland Kitchen Chest pain    a. 07/2009 MV: EF 48%. No ischemia.  . Diabetes mellitus without complication ([place])   . HFmrEF (heart failure with mildy reduced ejection fraction) ([place])   . Hypertension     Assessment: 60yo male with past medical history significant ofsCHF withEF of 45 to 50%, hypertension, hyperlipidemia, diabetes mellitus, COVID-19 infection 4 months ago, CKD 3B, who presented with shortness of breath. He was found to have an elevated BNP at 834.1, troponins elevated at 124>127>200>133. Pharmacy consulted to dose heparin in this 60 year old male admitted with ACS/NSTEMI.  No prior anticoag noted.   Baseline labs: Plt 223, Hgb 11.3, Hct 34.6 H&H and Plt remain stable  1/28 2112  HL 0.19,  subtherapeutic 1/29 0615 HL 0.51, therapeutic x1 1/29 1445 HL 0.37, therapeutic x2  Goal of Therapy:  Heparin level 0.3-0.7 units/ml Monitor platelets by anticoagulation protocol: Yes   Plan:  HL 0.37 1445, therapeutic  Continue heparin drip at 1100 units/hr. Recheck HL at 0500 with AM labs. CBC daily while on heparin drip.  Sherilyn Banker, PharmD Pharmacy Resident  07/17/2020 3:14 PM

## 2020-07-18 ENCOUNTER — Inpatient Hospital Stay: Payer: BC Managed Care – PPO

## 2020-07-18 DIAGNOSIS — N170 Acute kidney failure with tubular necrosis: Secondary | ICD-10-CM

## 2020-07-18 DIAGNOSIS — N179 Acute kidney failure, unspecified: Secondary | ICD-10-CM | POA: Diagnosis not present

## 2020-07-18 DIAGNOSIS — I5023 Acute on chronic systolic (congestive) heart failure: Secondary | ICD-10-CM | POA: Diagnosis not present

## 2020-07-18 DIAGNOSIS — R778 Other specified abnormalities of plasma proteins: Secondary | ICD-10-CM | POA: Diagnosis not present

## 2020-07-18 DIAGNOSIS — E7800 Pure hypercholesterolemia, unspecified: Secondary | ICD-10-CM

## 2020-07-18 LAB — BASIC METABOLIC PANEL
Anion gap: 10 (ref 5–15)
BUN: 55 mg/dL — ABNORMAL HIGH (ref 6–20)
CO2: 25 mmol/L (ref 22–32)
Calcium: 8.9 mg/dL (ref 8.9–10.3)
Chloride: 105 mmol/L (ref 98–111)
Creatinine, Ser: 3.25 mg/dL — ABNORMAL HIGH (ref 0.61–1.24)
GFR, Estimated: 21 mL/min — ABNORMAL LOW (ref >60)
Glucose, Bld: 160 mg/dL — ABNORMAL HIGH (ref 70–99)
Potassium: 4 mmol/L (ref 3.5–5.1)
Sodium: 140 mmol/L (ref 135–145)

## 2020-07-18 LAB — GLUCOSE, CAPILLARY
Glucose-Capillary: 36 mg/dL — CL (ref 70–99)
Glucose-Capillary: 39 mg/dL — CL (ref 70–99)
Glucose-Capillary: 243 mg/dL — ABNORMAL HIGH (ref 70–99)
Glucose-Capillary: 38 mg/dL — CL (ref 70–99)
Glucose-Capillary: 141 mg/dL — ABNORMAL HIGH (ref 70–99)
Glucose-Capillary: 190 mg/dL — ABNORMAL HIGH (ref 70–99)
Glucose-Capillary: 254 mg/dL — ABNORMAL HIGH (ref 70–99)
Glucose-Capillary: 212 mg/dL — ABNORMAL HIGH (ref 70–99)

## 2020-07-18 LAB — CBC
HCT: 34.5 % — ABNORMAL LOW (ref 39.0–52.0)
Hemoglobin: 11.8 g/dL — ABNORMAL LOW (ref 13.0–17.0)
MCH: 30.1 pg (ref 26.0–34.0)
MCHC: 34.2 g/dL (ref 30.0–36.0)
MCV: 88 fL (ref 80.0–100.0)
Platelets: 216 10*3/uL (ref 150–400)
RBC: 3.92 MIL/uL — ABNORMAL LOW (ref 4.22–5.81)
RDW: 12 % (ref 11.5–15.5)
WBC: 8.2 10*3/uL (ref 4.0–10.5)
nRBC: 0 % (ref 0.0–0.2)

## 2020-07-18 LAB — BRAIN NATRIURETIC PEPTIDE: B Natriuretic Peptide: 418.7 pg/mL — ABNORMAL HIGH (ref 0.0–100.0)

## 2020-07-18 LAB — PHOSPHORUS: Phosphorus: 4.3 mg/dL (ref 2.5–4.6)

## 2020-07-18 LAB — HEPARIN LEVEL (UNFRACTIONATED)
Heparin Unfractionated: 1.16 IU/mL — ABNORMAL HIGH (ref 0.30–0.70)
Heparin Unfractionated: 0.83 IU/mL — ABNORMAL HIGH (ref 0.30–0.70)

## 2020-07-18 LAB — VITAMIN B12: Vitamin B-12: 285 pg/mL (ref 180–914)

## 2020-07-18 MED ORDER — DEXTROSE 50 % IV SOLN
INTRAVENOUS | Status: AC
  Administered 2020-07-18: 50 mL
  Filled 2020-07-18: qty 50

## 2020-07-18 MED ORDER — ISOSORBIDE MONONITRATE ER 60 MG PO TB24
120.0000 mg | ORAL_TABLET | Freq: Every day | ORAL | Status: DC
  Administered 2020-07-19 – 2020-07-21 (×3): 120 mg via ORAL
  Filled 2020-07-18 (×3): qty 2

## 2020-07-18 MED ORDER — SODIUM CHLORIDE 0.9 % IV SOLN: INTRAVENOUS | Status: DC

## 2020-07-18 MED ORDER — SODIUM CHLORIDE 0.9 % IV SOLN: 250.0000 mL | INTRAVENOUS | Status: DC | PRN

## 2020-07-18 MED ORDER — SODIUM CHLORIDE 0.9% FLUSH: 3.0000 mL | INTRAVENOUS | Status: DC | PRN

## 2020-07-18 MED ORDER — ASPIRIN 81 MG PO CHEW
81.0000 mg | CHEWABLE_TABLET | ORAL | Status: AC
  Administered 2020-07-19: 81 mg via ORAL
  Filled 2020-07-18: qty 1

## 2020-07-18 MED ORDER — HEPARIN (PORCINE) 25000 UT/250ML-% IV SOLN
750.0000 [IU]/h | INTRAVENOUS | Status: DC
  Administered 2020-07-18: 900 [IU]/h via INTRAVENOUS
  Filled 2020-07-18: qty 250

## 2020-07-18 MED ORDER — HYDRALAZINE HCL 50 MG PO TABS
100.0000 mg | ORAL_TABLET | Freq: Two times a day (BID) | ORAL | Status: DC
  Administered 2020-07-18 – 2020-07-25 (×14): 100 mg via ORAL
  Filled 2020-07-18 (×14): qty 2

## 2020-07-18 MED ORDER — INSULIN DETEMIR 100 UNIT/ML ~~LOC~~ SOLN
10.0000 [IU] | Freq: Every day | SUBCUTANEOUS | Status: DC
  Administered 2020-07-19: 10 [IU] via SUBCUTANEOUS
  Filled 2020-07-18 (×2): qty 0.1

## 2020-07-18 NOTE — Progress Notes (Signed)
[place] [facility] [place] NAME: Jimmy Olson    MR#:  [id number]  DATE OF BIRTH:  January 19, 1961  SUBJECTIVE:   Patient seen via [place] video interpreter. Complains of mild chest pain on and off appears continuous.  Tolerating some PO diet. No vomiting. Sugar this morning was 39. Holding insulin. Patient say has been drinking losartan and ate some breakfast REVIEW OF SYSTEMS:   Review of Systems  Constitutional: Negative for chills, fever and weight loss.  HENT: Negative for ear discharge, ear pain and nosebleeds.   Eyes: Negative for blurred vision, pain and discharge.  Respiratory: Positive for shortness of breath. Negative for sputum production, wheezing and stridor.   Cardiovascular: Positive for chest pain. Negative for palpitations, orthopnea and PND.  Gastrointestinal: Negative for diarrhea, nausea and vomiting.  Genitourinary: Negative for frequency and urgency.  Musculoskeletal: Negative for back pain and joint pain.  Neurological: Negative for sensory change, speech change, focal weakness and weakness.  Psychiatric/Behavioral: Negative for depression and hallucinations. The patient is not nervous/anxious.    Tolerating Diet:yesTolerating PT:   DRUG ALLERGIES:  No Known Allergies  VITALS:  Blood pressure 134/76, pulse 67, temperature (!) 97.5 F (36.4 C), temperature source Oral, resp. rate 18, height '5\' 5"'$  (1.651 m), weight 71.6 kg, SpO2 100 %.  PHYSICAL EXAMINATION:   Physical Exam  General exam: Appears calm and comfortable  Respiratory system: basilar crackles and on 2 lit of [place] oxygen, rhonchi wheezing no respiratory distress noted. Cardiovascular system: S1 & S2 heard, RRR. No JVD, . No pedal edema. Gastrointestinal system: Abdomen is nondistended, soft and nontender.. Normal bowel sounds heard. Central nervous system: Alert and oriented. No focal neurological deficits. Extremities: Symmetric 5 x 5 power. Skin: No rashes,  lesions or ulcers Psychiatry:  Mood & affect appropriate.  LABORATORY PANEL:  CBC Recent Labs  Lab 07/18/20 0552  WBC 8.2  HGB 11.8*  HCT 34.5*  PLT 216    Chemistries  Recent Labs  Lab 07/15/20 0359 07/16/20 0532 07/17/20 0615 07/18/20 0552  NA 141 144   < > 140  K 3.7 3.4*   < > 4.0  CL 108 106   < > 105  CO2 25 29   < > 25  GLUCOSE 245* 145*   < > 160*  BUN 42* 46*   < > 55*  CREATININE 3.00* 2.96*   < > 3.25*  CALCIUM 8.5* 8.8*   < > 8.9  MG  --  1.9  --   --   AST 18  --   --   --   ALT 15  --   --   --   ALKPHOS 81  --   --   --   BILITOT 0.5  --   --   --    < > = values in this interval not displayed.   Cardiac Enzymes No results for input(s): TROPONINI in the last 168 hours. RADIOLOGY:  DG Chest 1 View  Result Date: 07/18/2020 CLINICAL DATA:  In patient follow-up of acute tubular necrosis. EXAM: CHEST  1 VIEW COMPARISON:  Yesterday FINDINGS: Borderline heart size. Unremarkable aortic and hilar contours. There is no edema, consolidation, effusion, or pneumothorax. IMPRESSION: Low volume but clear lungs. Electronically Signed   By: Monte Fantasia M.D.   On: 07/18/2020 11:35   DG Chest 1 View  Result Date: 07/17/2020 CLINICAL DATA:  60 year old male with a history of acute tubular necrosis EXAM: CHEST  1 VIEW COMPARISON:  07/15/2020 FINDINGS: Cardiomediastinal silhouette unchanged in size and contour. Low lung volumes with slight improved aeration at the lung bases. No pneumothorax or pleural effusion. IMPRESSION: Slight improved airspace opacity at the right lung base with persisting low lung volumes. Electronically Signed   By: Corrie Mckusick D.O.   On: 07/17/2020 12:43   US RENAL  Result Date: 07/17/2020 CLINICAL DATA:  Acute tubular necrosis, history diabetes mellitus, hypertension, cardiomyopathy EXAM: RENAL / URINARY TRACT ULTRASOUND COMPLETE COMPARISON:  CT abdomen and pelvis 02/01/2007 FINDINGS: Right Kidney: Renal measurements: 11.1 x 5.5 x 5.2 cm =  volume: 167 mL. Normal cortical thickness. Increased cortical echogenicity. No mass, hydronephrosis, or shadowing calcification. Left Kidney: Renal measurements: 10.5 x 5.8 x 7.3 cm = volume: 234 mL. Normal cortical thickness. Increased cortical echogenicity. No mass, hydronephrosis, or shadowing calcification. Bladder: Appears normal for degree of bladder distention. Other: Prostate gland enlarged 5.1 x 4.2 x 5.3 cm (volume = 59 cm^3) IMPRESSION: Medical renal disease changes of both kidneys. No evidence of renal mass or hydronephrosis. Prostatic enlargement. Electronically Signed   By: Lavonia Dana M.D.   On: 07/17/2020 17:02   ASSESSMENT AND PLAN:  Jimmy Olson a 60 y.o.malewith medical history significant ofsCHF withEF of 45 to 50%, hypertension, hyperlipidemia, diabetes mellitus, COVID-19 infection 4 months ago, CKD 3B, who presents with shortness of breath.  Patient reports having shortness of breath for more than 2 days associated with intermittent chest pain and chest tightness.  Chest x-ray shows interstitial pulmonary edema with possible small right pleural effusion.  Patient admitted for acute on chronic systolic and diastolic heart failure.  Acute respiratory Failure with hypoxia Acute on chronic systolic and diastolic heart failure.  -- requiring up to 2 lit of [place] oxygen to keep sats greater than 90%.  --on IV lasix 40 mg BID--now d/ced --Card[facility] consulted-- Recommends ischemic workup with left heart catheterization plan for Monday 1/31 -- strict intake and output. Daily weights.  --2 d echo shows worsening of the left ventricular EF form 40 to 45% to 30 to 35%.  With grade 2 diastolic dysfunction.  NSTEMI --Intermittent chest pain and elevated troponins.  --on IV heparin, continue with aspirin and imdur.  --EKG shows t wave inversions in the lateral leads and minimal ST elevation.   Type 2 DM:  a1C IS 9.7% Resume levemir and SSI.--holding today due to  hyoglycemia  Hypokalemia:  Replace. K   AKI: superimposed on CKD stage IIIB Baseline creatinine appears to be between 1.8 to 2.  Admitted with a creatinine of 3 and creatinine REMAINS AT 3.41  nephrology consultation with Dr. Theador Hawthorne  Hyperlipidemia:  LDL is 110. Increase crestor to 20 mg daily.   Hypertension:  Bp ok   DVT prophylaxis: heparin Code Status: (Fullcode ) Family Communication: none at bedside.  Disposition: home  Status is: Inpatient  Remains inpatient appropriate because:Ongoing diagnostic w/u for NSTEMI, Acute on CKD-IIIB and chf   Dispo: The patient is from: Home  Anticipated d/c is to: Home  Anticipated d/c date is: 2-3 days  Patient currently is not medically stable to d/c.              Difficult to place patient No  Level of care: Progressive Cardiac  Needs cardiac cath 1/31 Nephrology consulted      TOTAL TIME TAKING CARE OF THIS PATIENT: 25* minutes.  >50% time spent on counselling and coordination of care  Note: This dictation was prepared with [employer]  dictation along with smaller phrase technology. Any transcriptional errors that result from this process are unintentional.  Fritzi Mandes M.D    [facility]   CC: Primary care physician; [facility]Patient ID: [facility], male   DOB: 03-06-1961, 60 y.o.   MRN: [id number]

## 2020-07-18 NOTE — Progress Notes (Signed)
Jimmy Olson  MRN: [id number]  DOB/AGE: 1961/01/01 60 y.o.  Primary Care Physician:[facility] [place] date: 07/15/2020  Chief Complaint:  Chief Complaint  Patient presents with  . Shortness of Breath    S-Pt presented on  07/15/2020 with  Chief Complaint  Patient presents with  . Shortness of Breath  . Patient main complaint today visit was that he still has chest pain. Patient has been noted no specific physical complaints I then discussed patient case with the cardiologist as well the primary team Patient is most likely going for catheterization in the morning. I did educate the patient about risk versus benefit of cath. Patient understands the possibility of worsening of kidney function.  Medications   . aspirin EC  81 mg Oral Daily  . bisoprolol  10 mg Oral Daily  . citalopram  20 mg Oral Daily  . feeding supplement (GLUCERNA SHAKE)  237 mL Oral BID BM  . hydrALAZINE  100 mg Oral BID  . insulin aspart  0-5 Units Subcutaneous QHS  . insulin aspart  0-9 Units Subcutaneous TID WC  . [START ON 07/19/2020] insulin detemir  10 Units Subcutaneous Daily  . [START ON 07/19/2020] isosorbide mononitrate  120 mg Oral Daily  . multivitamin with minerals  1 tablet Oral Daily  . pantoprazole  40 mg Oral Daily  . rosuvastatin  40 mg Oral Daily         [id number] from the symptoms mentioned above,there are no other symptoms referable to all systems reviewed.  Physical Exam: Vital signs in last 24 hours: Temp:  [97.5 F (36.4 C)-98.6 F (37 C)] 97.5 F (36.4 C) (01/30 0814) Pulse Rate:  [59-71] 67 (01/30 1109) Resp:  [18-19] 18 (01/30 0814) BP: (131-174)/(75-99) 134/76 (01/30 1109) SpO2:  [96 %-100 %] 100 % (01/30 1109) Weight:  [71.6 kg] 71.6 kg (01/30 0403) Weight change: 0.272 kg Last BM Date: 07/17/20  Intake/Output from previous day: 01/29 0701 - 01/30 0700 In: 514.5 [P.O.:240; I.V.:274.5] Out: 800 [Urine:800] Total I/O In: 240  [P.O.:240] Out: -    Physical Exam:  General- pt is awake,alert, oriented to time place and person  Resp- No acute REsp distress, minimal Rhonchi  CVS- S1S2 regular in rate and rhythm  GIT- BS+, soft, Non tender , Non distended  EXT- No LE Edema,  No Cyanosis   Lab Results:  CBC  Recent Labs    07/17/20 0615 07/18/20 0552  WBC 7.9 8.2  HGB 12.1* 11.8*  HCT 34.8* 34.5*  PLT 248 216    BMET  Recent Labs    07/17/20 0615 07/18/20 0552  NA 143 140  K 4.0 4.0  CL 106 105  CO2 29 25  GLUCOSE 94 160*  BUN 55* 55*  CREATININE 3.41* 3.25*  CALCIUM 9.4 8.9   Creatinine trend 2022 3.0==> 3.4 2021 1.8--2.0    Most recent Creatinine trend  Lab Results  Component Value Date   CREATININE 3.25 (H) 07/18/2020   CREATININE 3.41 (H) 07/17/2020   CREATININE 2.96 (H) 07/16/2020      MICRO   Recent Results (from the past 240 hour(s))  SARS Coronavirus 2 by RT PCR (hospital order, performed in [facility] hospital lab) Nasopharyngeal Nasopharyngeal Swab     Status: None   Collection Time: 07/15/20  3:59 AM   Specimen: Nasopharyngeal Swab  Result Value Ref Range Status   SARS Coronavirus 2 NEGATIVE NEGATIVE Final    Comment: (NOTE) SARS-CoV-2 target nucleic acids are  NOT DETECTED.  The SARS-CoV-2 RNA is generally detectable in upper and lower respiratory specimens during the acute phase of infection. The lowest concentration of SARS-CoV-2 viral copies this assay can detect is 250 copies / mL. A negative result does not preclude SARS-CoV-2 infection and should not be used as the sole basis for treatment or other patient management decisions.  A negative result may occur with improper specimen collection / handling, submission of specimen other than nasopharyngeal swab, presence of viral mutation(s) within the areas targeted by this assay, and inadequate number of viral copies (<250 copies / mL). A negative result must be combined with clinical observations,  patient history, and epidemiological information.  Fact Sheet for Patients:   StrictlyIdeas.no  Fact Sheet for Healthcare Providers: BankingDealers.co.za  This test is not yet approved or  cleared by the [place] FDA and has been authorized for detection and/or diagnosis of SARS-CoV-2 by FDA under an Emergency Use Authorization (EUA).  This EUA will remain in effect (meaning this test can be used) for the duration of the COVID-19 declaration under Section 564(b)(1) of the Act, 21 U.S.C. section 360bbb-3(b)(1), unless the authorization is terminated or revoked sooner.  Performed at [facility], [address] [place], [place] [address]   Culture, blood (single)     Status: None (Preliminary result)   Collection Time: 07/15/20  3:59 AM   Specimen: BLOOD  Result Value Ref Range Status   Specimen Description BLOOD RIGHT FA  Final   Special Requests   Final    BOTTLES DRAWN AEROBIC AND ANAEROBIC Blood Culture results may not be optimal due to an excessive volume of blood received in culture bottles   Culture   Final    NO GROWTH 3 DAYS Performed at [facility], [address], [place],  [address]    Report Status PENDING  Incomplete         Impression:  Patient is a 60 year old male with past medical history of diabetes mellitus, hypertension, CHF who came to the hospital with shortness of breath.  Patient is currently admitted with acute kidney injury, NSTEMI, hypertension, anemia   1)Renal    AKI secondary to ATN/cardiorenal/postobstructive Patient has AKI on CKD Patient has CKD stage IIIb Patient has CKD stage IIIb since 2021-most likely before that but not at as well above Patient has CKD secondary diabetes mellitus  There was a thought process yesterday and the patient is hypovolemic as patient chest x-ray was clear and patient 2D echo was reviewed.   Patient inferior vena cava and 2D  echo was showing signs of hypovolemia so we started patient on IV fluids.  Patient today appears hypervolemic We will ask for repeat chest x-ray. Patient chest x-ray does not show any fluid overload So will hold patient IV fluids and not give patient any diuretics. We will follow      2)HTN    Blood pressure is stable    3)Anemia of chronic disease  CBC Latest Ref Rng & Units 07/18/2020 07/17/2020 07/16/2020  WBC 4.0 - 10.5 K/uL 8.2 7.9 7.8  Hemoglobin 13.0 - 17.0 g/dL 11.8(L) 12.1(L) 11.7(L)  Hematocrit 39.0 - 52.0 % 34.5(L) 34.8(L) 33.7(L)  Platelets 150 - 400 K/uL 216 248 232     Results for ROYZELL, ARMINIO (MRN [id number]) as of 07/18/2020 09:23  Ref. Range 07/17/2020 19:30  Iron Latest Ref Range: 45 - 182 ug/dL 87  UIBC Latest Units: ug/dL 208  TIBC Latest Ref Range: 250 - 450  ug/dL 295  Saturation Ratios Latest Ref Range: 17.9 - 39.5 % 30  Ferritin Latest Ref Range: 24 - 336 ng/mL 63  Folate Latest Ref Range: >5.9 ng/mL 14.9     HGb at goal (9--11) Patient does have anemia of chronic disease No need for Epogen for now Patient iron saturation/folate/vitamin B12 are within normal limits  4) Secondary hyperparathyroidism -CKD Mineral-Bone Disorder    Lab Results  Component Value Date   CALCIUM 8.9 07/18/2020   PHOS 4.3 07/18/2020    Secondary Hyperparathyroidism absent.  Phosphorus at goal.    5) NSTEMI Primary team and cardiology following Patient is possibly scheduled to go for catheterization on Monday   6) Electrolytes   BMP Latest Ref Rng & Units 07/18/2020 07/17/2020 07/16/2020  Glucose 70 - 99 mg/dL 160(H) 94 145(H)  BUN 6 - 20 mg/dL 55(H) 55(H) 46(H)  Creatinine 0.61 - 1.24 mg/dL 3.25(H) 3.41(H) 2.96(H)  Sodium 135 - 145 mmol/L 140 143 144  Potassium 3.5 - 5.1 mmol/L 4.0 4.0 3.4(L)  Chloride 98 - 111 mmol/L 105 106 106  CO2 22 - 32 mmol/L '25 29 29  '$ Calcium 8.9 - 10.3 mg/dL 8.9 9.4 8.8(L)      Sodium Normonatremic   Potassium Hypokalemia Now better    7)Acid base  Co2 at goal     Plan:   AKI is at plateau/GFR is stable Patient chest x-ray, on physical exam does not show any fluid overload patient does complain of minimal shortness of breath Patient AKI is minimally better Patient is at high risk for CHF as patient EF is low We will hold off on IV fluids We will hold off on diuretics as well I did discuss with the patient about possible side effects of IV contrast including worsening of kidney disease Patient was understanding       Angelus Hoopes s [facility] 07/18/2020, 12:50 PM

## 2020-07-18 NOTE — Progress Notes (Signed)
ANTICOAGULATION CONSULT NOTE  Pharmacy Consult for Heparin  Indication: chest pain/ACS  No Known Allergies  Patient Measurements: Height: '5\' 5"'$  (165.1 cm) Weight: 71.6 kg (157 lb 14.4 oz) IBW/kg (Calculated) : 61.5 Heparin Dosing Weight: 70.2 kg   Vital Signs: Temp: 97.8 F (36.6 C) (01/30 0403) BP: 131/75 (01/30 0403) Pulse Rate: 59 (01/30 0403)  Labs: Recent Labs     0000 07/15/20 1943 07/15/20 2240 07/16/20 0532 07/16/20 2112 07/17/20 0615 07/17/20 1026 07/17/20 1445 07/18/20 0552  HGB   < >  --   --  11.7*  --  12.1*  --   --  11.8*  HCT  --   --   --  33.7*  --  34.8*  --   --  34.5*  PLT  --   --   --  232  --  248  --   --  216  HEPARINUNFRC  --   --   --   --    < > 0.51  --  0.37 1.16*  CREATININE  --   --   --  2.96*  --  3.41*  --   --  3.25*  TROPONINIHS  --  190* 200*  --   --   --  133*  --   --    < > = values in this interval not displayed.    Estimated Creatinine Clearance: 21.8 mL/min (A) (by C-G formula based on SCr of 3.25 mg/dL (H)).   Medical History: Past Medical History:  Diagnosis Date  . Cardiomyopathy ([place])    a. 07/2009 MV: EF 48%; b. 08/2019 Echo: EF 45-50%. Global HK. Mod LVH. Gr1 DD. Nl RV fxn. Nl PASP.  Marland Kitchen Chest pain    a. 07/2009 MV: EF 48%. No ischemia.  . Diabetes mellitus without complication ([place])   . HFmrEF (heart failure with mildy reduced ejection fraction) ([place])   . Hypertension     Assessment: 60yo male with past medical history significant ofsCHF withEF of 45 to 50%, hypertension, hyperlipidemia, diabetes mellitus, COVID-19 infection 4 months ago, CKD 3B, who presented with shortness of breath. He was found to have an elevated BNP at 834.1, troponins elevated at 124>127>200>133. Pharmacy consulted to dose heparin in this 60 year old male admitted with ACS/NSTEMI.  No prior anticoag noted.   Baseline labs: Plt 223, Hgb 11.3, Hct 34.6 H&H and Plt remain stable  1/28 2112  HL 0.19, subtherapeutic 1/29 0615 HL 0.51,  therapeutic x1 1/29 1445 HL 0.37, therapeutic x2 1/30 0552 HL 1.16, supratherapeutic  Goal of Therapy:  Heparin level 0.3-0.7 units/ml Monitor platelets by anticoagulation protocol: Yes   Plan:  HL 1.16 supratherapeutic  Contacted RN.  Hold drip for 1 hour. Restart drip at 900 units/hr.  Recheck HL in 8 hrs. CBC daily while on heparin drip.  Renda Rolls, PharmD, [facility] 07/18/2020 6:55 AM

## 2020-07-18 NOTE — Progress Notes (Signed)
ANTICOAGULATION CONSULT NOTE  Pharmacy Consult for Heparin  Indication: chest pain/ACS  No Known Allergies  Heparin Dosing Weight: 70.2 kg    Labs: Recent Labs     0000 07/15/20 1943 07/15/20 2240 07/16/20 0532 07/16/20 2112 07/17/20 0615 07/17/20 1026 07/17/20 1445 07/18/20 0552 07/18/20 1622  HGB   < >  --   --  11.7*  --  12.1*  --   --  11.8*  --   HCT  --   --   --  33.7*  --  34.8*  --   --  34.5*  --   PLT  --   --   --  232  --  248  --   --  216  --   HEPARINUNFRC  --   --   --   --    < > 0.51  --  0.37 1.16* 0.83*  CREATININE  --   --   --  2.96*  --  3.41*  --   --  3.25*  --   TROPONINIHS  --  190* 200*  --   --   --  133*  --   --   --    < > = values in this interval not displayed.    Estimated Creatinine Clearance: 21.8 mL/min (A) (by C-G formula based on SCr of 3.25 mg/dL (H)).   Medical History: Past Medical History:  Diagnosis Date  . Cardiomyopathy ([place])    a. 07/2009 MV: EF 48%; b. 08/2019 Echo: EF 45-50%. Global HK. Mod LVH. Gr1 DD. Nl RV fxn. Nl PASP.  Marland Kitchen Chest pain    a. 07/2009 MV: EF 48%. No ischemia.  . Diabetes mellitus without complication ([place])   . HFmrEF (heart failure with mildy reduced ejection fraction) ([place])   . Hypertension     Assessment: 60yo male with past medical history significant ofsCHF withEF of 45 to 50%, hypertension, hyperlipidemia, diabetes mellitus, COVID-19 infection 4 months ago, CKD 3B, who presented with shortness of breath. He was found to have an elevated BNP at 834.1, troponins elevated at 124>127>200>133. Pharmacy consulted to dose heparin in this 60 year old male admitted with ACS/NSTEMI.  No prior anticoag noted.   Baseline labs: Plt 223, Hgb 11.3, Hct 34.6 H&H and Plt remain stable  1/28 2112  HL 0.19, subtherapeutic 1/29 0615 HL 0.51, therapeutic x1 1/29 1445 HL 0.37, therapeutic x2 1/30 0552 HL 1.16, supratherapeutic - held for 1 hr, rate decreased to 900 units/hr 1/30 1622 HL 0.83, supratherapeutic  --   Goal of Therapy:  Heparin level 0.3-0.7 units/ml Monitor platelets by anticoagulation protocol: Yes   Plan:  HL 0.83 slightly supratherapeutic. Will decrease infusion rate to 750 units/hr  Recheck HL in 8 hrs. CBC daily while on heparin drip.  Dorothe Pea, PharmD, BCPS Clinical Pharmacist  07/18/2020 4:56 PM

## 2020-07-18 NOTE — Progress Notes (Signed)
Hypoglycemic Event  CBG: 36  Treatment: 8 oz juice/soda  Symptoms: Sweaty, Shaky and patient c/o feeling low  Follow-up CBG: Time: 0222 CBG Result:39  Possible Reasons for Event: Medication regimen: Given 2 units novolog and 30 units Lantus  Comments/MD notified: Pt given 8 oz orange juice and 2 packs of peanut butter.   Update: On first recheck at 0222 CBG 39, pt had only had a few sips of juice and drank a Glucerna shake. This RN told patient to drink all of the juice this time and eat one of the peanut butter packs.  Update 0250: CBG recheck at 0240 was 38. Due to patient expressing fatigue with taking POs, 1 amp of D50 was given. Will recheck in 15 minutes.  Update 0309: CBG recheck at 0305 was 243. Pt feeling better after D50 and is resting comfortably in bed.  Earleen Reaper

## 2020-07-18 NOTE — H&P (View-Only) (Signed)
Progress Note  Patient Name: Jimmy Olson Date of Encounter: 07/18/2020  [facility] Cardiologist: Ida Rogue, MD   Subjective   Reports current chest pain, as well as a throbbing in his chest.  He reports this is still constant but improved in severity from yesterday.  Currently short of breath at rest. On 2L [place] oxygen. No O2 at home. Reports symptoms of orthopnea, stating that he feels as if he is choking when laying flat.  Boughing up greater amounts of white phlegm.  Reports concern regarding his upcoming catheterization and finances.  States that he is very worried about the procedure and requests if his wife, Verdis Frederickson, is able to accompany him.  His son has schizophrenia and is not able to visit him and his daughter is currently busy working.  Inpatient Medications    Scheduled Meds: . aspirin EC  81 mg Oral Daily  . bisoprolol  10 mg Oral Daily  . citalopram  20 mg Oral Daily  . feeding supplement (GLUCERNA SHAKE)  237 mL Oral BID BM  . hydrALAZINE  50 mg Oral BID  . insulin aspart  0-5 Units Subcutaneous QHS  . insulin aspart  0-9 Units Subcutaneous TID WC  . insulin detemir  30 Units Subcutaneous BID  . isosorbide mononitrate  60 mg Oral Daily  . multivitamin with minerals  1 tablet Oral Daily  . pantoprazole  40 mg Oral Daily  . rosuvastatin  40 mg Oral Daily  . sodium chloride flush  3 mL Intravenous Q12H   Continuous Infusions: . sodium chloride    . heparin     PRN Meds: sodium chloride, acetaminophen, albuterol, alum & mag hydroxide-simeth, calcium carbonate, dextromethorphan-guaiFENesin, hydrALAZINE, nitroGLYCERIN, ondansetron (ZOFRAN) IV, ondansetron, sodium chloride flush   Vital Signs    Vitals:   07/17/20 1717 07/17/20 1938 07/18/20 0403 07/18/20 0814  BP: (!) 147/80 (!) 155/90 131/75 (!) 174/99  Pulse: 69 71 (!) 59 64  Resp: '18 19 19 18  '$ Temp: 98.6 F (37 C) 98.6 F (37 C) 97.8 F (36.6 C) (!) 97.5 F (36.4 C)  TempSrc: Oral   Oral   SpO2: 100% 100% 96% 100%  Weight:   71.6 kg   Height:        Intake/Output Summary (Last 24 hours) at 07/18/2020 0841 Last data filed at 07/18/2020 0408 Gross per 24 hour  Intake 514.51 ml  Output 800 ml  Net -285.49 ml   Last 3 Weights 07/18/2020 07/17/2020 07/15/2020  Weight (lbs) 157 lb 14.4 oz 157 lb 4.8 oz 154 lb 12.2 oz  Weight (kg) 71.623 kg 71.351 kg 70.2 kg      Telemetry    NSR, predominantly 60s to 70s- Personally Reviewed  ECG    No new tracings - Personally Reviewed  Physical Exam   GEN: No acute distress.   Neck: No JVD Cardiac: RRR, no murmurs, rubs, or gallops.  Respiratory:  Faint bibasilar crackles. GI: Soft, nontender, non-distended  MS: No edema; No deformity. Neuro:  Nonfocal  Psych: Normal affect   Labs    High Sensitivity Troponin:   Recent Labs  Lab 07/15/20 0535 07/15/20 1124 07/15/20 1943 07/15/20 2240 07/17/20 1026  TROPONINIHS 127* 176* 190* 200* 133*      Chemistry Recent Labs  Lab 07/15/20 0359 07/16/20 0532 07/17/20 0615 07/18/20 0552  NA 141 144 143 140  K 3.7 3.4* 4.0 4.0  CL 108 106 106 105  CO2 '25 29 29 25  '$ GLUCOSE 245* 145*  94 160*  BUN 42* 46* 55* 55*  CREATININE 3.00* 2.96* 3.41* 3.25*  CALCIUM 8.5* 8.8* 9.4 8.9  PROT 6.3*  --   --   --   ALBUMIN 3.0*  --   --   --   AST 18  --   --   --   ALT 15  --   --   --   ALKPHOS 81  --   --   --   BILITOT 0.5  --   --   --   GFRNONAA 23* 24* 20* 21*  ANIONGAP '8 9 8 10     '$ Hematology Recent Labs  Lab 07/16/20 0532 07/17/20 0615 07/17/20 1930 07/18/20 0552  WBC 7.8 7.9  --  8.2  RBC 3.91* 4.01* 3.39* 3.92*  HGB 11.7* 12.1*  --  11.8*  HCT 33.7* 34.8*  --  34.5*  MCV 86.2 86.8  --  88.0  MCH 29.9 30.2  --  30.1  MCHC 34.7 34.8  --  34.2  RDW 12.2 12.0  --  12.0  PLT 232 248  --  216    BNP Recent Labs  Lab 07/15/20 0359  BNP 834.1*     DDimer No results for input(s): DDIMER in the last 168 hours.   Radiology    DG Chest 1 View  Result Date:  07/17/2020 CLINICAL DATA:  60 year old male with a history of acute tubular necrosis EXAM: CHEST  1 VIEW COMPARISON:  07/15/2020 FINDINGS: Cardiomediastinal silhouette unchanged in size and contour. Low lung volumes with slight improved aeration at the lung bases. No pneumothorax or pleural effusion. IMPRESSION: Slight improved airspace opacity at the right lung base with persisting low lung volumes. Electronically Signed   By: Corrie Mckusick D.O.   On: 07/17/2020 12:43   US RENAL  Result Date: 07/17/2020 CLINICAL DATA:  Acute tubular necrosis, history diabetes mellitus, hypertension, cardiomyopathy EXAM: RENAL / URINARY TRACT ULTRASOUND COMPLETE COMPARISON:  CT abdomen and pelvis 02/01/2007 FINDINGS: Right Kidney: Renal measurements: 11.1 x 5.5 x 5.2 cm = volume: 167 mL. Normal cortical thickness. Increased cortical echogenicity. No mass, hydronephrosis, or shadowing calcification. Left Kidney: Renal measurements: 10.5 x 5.8 x 7.3 cm = volume: 234 mL. Normal cortical thickness. Increased cortical echogenicity. No mass, hydronephrosis, or shadowing calcification. Bladder: Appears normal for degree of bladder distention. Other: Prostate gland enlarged 5.1 x 4.2 x 5.3 cm (volume = 59 cm^3) IMPRESSION: Medical renal disease changes of both kidneys. No evidence of renal mass or hydronephrosis. Prostatic enlargement. Electronically Signed   By: Lavonia Dana M.D.   On: 07/17/2020 17:02   ECHOCARDIOGRAM COMPLETE  Result Date: 07/16/2020    ECHOCARDIOGRAM REPORT   Patient Name:   Jimmy Olson Date of Exam: 07/16/2020 Medical Rec #:  [id number]     Height:       65.0 in Accession #:    [id number]    Weight:       154.8 lb Date of Birth:  [birth year]     BSA:          1.774 m Patient Age:    60 years      BP:           152/92 mmHg Patient Gender: M             HR:           69 bpm. Exam Location:  [facility] Procedure: 2D Echo, Color Doppler, Cardiac Doppler and Strain Analysis Indications:  [id number] CHF-Acute Systolic   History:         Patient has prior history of Echocardiogram examinations, most                  recent 09/11/2019. HFpEF and Cardiomyopathy; Risk                  Factors:Hypertension and Diabetes. Pt had COVID-19 01/2020.  Sonographer:     Charmayne Sheer RDCS (AE) Referring Phys:  [id number] Ivor Costa Diagnosing Phys: Ida Rogue MD  Sonographer Comments: Global longitudinal strain was attempted. IMPRESSIONS  1. Left ventricular ejection fraction, by estimation, is 30 to 35%. The left ventricle has moderately decreased function. The left ventricle demonstrates global hypokinesis. The left ventricular internal cavity size was moderately dilated. There is mild  left ventricular hypertrophy. Left ventricular diastolic parameters are consistent with Grade II diastolic dysfunction (pseudonormalization). The average left ventricular global longitudinal strain is -10.1 %. The global longitudinal strain is abnormal.  2. Right ventricular systolic function is normal. The right ventricular size is normal. Tricuspid regurgitation signal is inadequate for assessing PA pressure.  3. Left atrial size was mildly dilated.  4. The inferior vena cava is normal in size with greater than 50% respiratory variability, suggesting right atrial pressure of 3 mmHg. FINDINGS  Left Ventricle: Left ventricular ejection fraction, by estimation, is 30 to 35%. The left ventricle has moderately decreased function. The left ventricle demonstrates global hypokinesis. The average left ventricular global longitudinal strain is -10.1 %. The global longitudinal strain is abnormal. The left ventricular internal cavity size was moderately dilated. There is mild left ventricular hypertrophy. Left ventricular diastolic parameters are consistent with Grade II diastolic dysfunction (pseudonormalization). Right Ventricle: The right ventricular size is normal. No increase in right ventricular wall thickness. Right ventricular systolic function is normal. Tricuspid  regurgitation signal is inadequate for assessing PA pressure. Left Atrium: Left atrial size was mildly dilated. Right Atrium: Right atrial size was normal in size. Pericardium: There is no evidence of pericardial effusion. Mitral Valve: The mitral valve is normal in structure. Mild mitral valve regurgitation. No evidence of mitral valve stenosis. MV peak gradient, 2.8 mmHg. The mean mitral valve gradient is 2.0 mmHg. Tricuspid Valve: The tricuspid valve is normal in structure. Tricuspid valve regurgitation is not demonstrated. No evidence of tricuspid stenosis. Aortic Valve: The aortic valve was not well visualized. Aortic valve regurgitation is not visualized. No aortic stenosis is present. Aortic valve mean gradient measures 3.0 mmHg. Aortic valve peak gradient measures 5.1 mmHg. Aortic valve area, by VTI measures 2.34 cm. Pulmonic Valve: The pulmonic valve was normal in structure. Pulmonic valve regurgitation is not visualized. No evidence of pulmonic stenosis. Aorta: The aortic root is normal in size and structure. Venous: The inferior vena cava is normal in size with greater than 50% respiratory variability, suggesting right atrial pressure of 3 mmHg. IAS/Shunts: No atrial level shunt detected by color flow Doppler.  LEFT VENTRICLE PLAX 2D LVIDd:         5.50 cm      Diastology LVIDs:         4.50 cm      LV e' medial:    4.35 cm/s LV PW:         1.20 cm      LV E/e' medial:  19.8 LV IVS:        0.70 cm      LV e' lateral:   5.55 cm/s LVOT diam:  2.20 cm      LV E/e' lateral: 15.5 LV SV:         54 LV SV Index:   30           2D Longitudinal Strain LVOT Area:     3.80 cm     2D Strain GLS (A2C):   -10.5 %                             2D Strain GLS (A3C):   -10.4 %                             2D Strain GLS (A4C):   -9.3 % LV Volumes (MOD)            2D Strain GLS Avg:     -10.1 % LV vol d, MOD A2C: 170.0 ml LV vol d, MOD A4C: 169.0 ml LV vol s, MOD A2C: 103.0 ml LV vol s, MOD A4C: 101.0 ml LV SV MOD A2C:      67.0 ml LV SV MOD A4C:     169.0 ml LV SV MOD BP:      69.6 ml LEFT ATRIUM           Index LA diam:      4.00 cm 2.25 cm/m LA Vol (A4C): 47.0 ml 26.50 ml/m  AORTIC VALVE                   PULMONIC VALVE AV Area (Vmax):    2.37 cm    PV Vmax:       0.63 m/s AV Area (Vmean):   2.48 cm    PV Vmean:      44.500 cm/s AV Area (VTI):     2.34 cm    PV VTI:        0.124 m AV Vmax:           113.00 cm/s PV Peak grad:  1.6 mmHg AV Vmean:          79.000 cm/s PV Mean grad:  1.0 mmHg AV VTI:            0.229 m AV Peak Grad:      5.1 mmHg AV Mean Grad:      3.0 mmHg LVOT Vmax:         70.50 cm/s LVOT Vmean:        51.600 cm/s LVOT VTI:          0.141 m LVOT/AV VTI ratio: 0.62  AORTA Ao Root diam: 3.00 cm MITRAL VALVE MV Area (PHT): 3.20 cm    SHUNTS MV Area VTI:   2.07 cm    Systemic VTI:  0.14 m MV Peak grad:  2.8 mmHg    Systemic Diam: 2.20 cm MV Mean grad:  2.0 mmHg MV Vmax:       0.84 m/s MV Vmean:      61.0 cm/s MV Decel Time: 237 msec MV E velocity: 86.10 cm/s MV A velocity: 60.40 cm/s MV E/A ratio:  1.43 Ida Rogue MD Electronically signed by Ida Rogue MD Signature Date/Time: 07/16/2020/11:09:56 AM    Final     Cardiac Studies   Echocardiogram 07/16/2020 1. Left ventricular ejection fraction, by estimation, is 30 to 35%. The  left ventricle has moderately decreased function. The left ventricle  demonstrates global hypokinesis. The left ventricular internal cavity size  was  moderately dilated. There is mild  left ventricular hypertrophy. Left ventricular diastolic parameters are  consistent with Grade II diastolic dysfunction (pseudonormalization). The  average left ventricular global longitudinal strain is -10.1 %. The global  longitudinal strain is abnormal.  2. Right ventricular systolic function is normal. The right ventricular  size is normal. Tricuspid regurgitation signal is inadequate for assessing  PA pressure.  3. Left atrial size was mildly dilated.  4. The inferior vena  cava is normal in size with greater than 50%  respiratory variability, suggesting right atrial pressure of 3 mmHg.   MPI 07/2009  1. Normal treadmill EKG without evidence of ischemia or arrhythmia.  2. Good exercise tolerance for age.  3. Normal LV systolic function. Ejection fraction 48%.  4. Normal myocardial perfusion without evidence of myocardial  ischemia.   Patient Profile     60 y.o. male  with a hx of previous LV dysfunction with most recent 07/16/20 echo LVEF decreased to 30-35%, history of COVID-19 infection 03/06/2020, poorly controlled hypertension, hyperlipidemia, DM2, CKD, and who is being seen today for the evaluation of newly reduced EF at the request of Dr. Karleen Hampshire.  Assessment & Plan    1. NSTEMI --Reports almost constant chest pain since his previous 08/2019 admission. LVEF 30-35% by most recent echo.  HS Tn peaked 200, now down-trending. Given newly reduced EF and chest pain, further ischemic work-up recommended with plan for [facility] after discussion with rounding MDs and nephrology. Risk factors for cardiac etiology include male, recently reduced EF without recent ischemic work-up, poorly controlled DM2, hypertension.   Continue IV heparin.  Daily CBC.    Continue   ASA 81 mg qd  Bisoprolol 10 mg qd   Increased Crestor 40 mg qd   PRN SL nitro for CP  Imdur, to be increased to 120 mg daily given ongoing CP with room in BP.  No ACE/ARB/Arni/MRA given CKD.    R/LHC tentatively scheduled for 1/31.    NPO after midnight 1/30  Recommend R/LHC for both further ischemic evaluation and better understanding of hemodynamics to assist with ongoing diuresis given renal function.  Daily BMET.   Started on very gentle IVF yesterday, given his renal function.  As below, appears volume overloaded today.  Will discontinue fluids and discussed with MD.   Current Cr 3.41  3.25 with BUN 55  55.  Nephrology consulted as below.  2. HFrEF --Reports  shortness of breath with exertion.  Previously noted to have hypertensive heart dz. EF down from 45-50%   30 to 35% with GLS abnl at -10.1%. Also noted was mild LVH, mild LVH, G2 DD, mild LAE. LHC recommended as above per MD.    Volume up on exam.  Started on gentle IVF yesterday due to renal function.  Volume up on exam today with elevated pressure and report of shortness of breath and orthopnea.  Currently on 2 L nasal cannula oxygen.  He does not use oxygen at home.  Will discuss restart of lasix/ to be discussed with MD.  Continue oxygen as needed.  Pending [facility] 1/31 as above.   NPO after midnight.  Recommend both R/LHC given volume status.  Recommend caution with IVF given reduced EF.  Escalation of GDMT precluded by renal function.   ReDS vest. Trend BNP.  Will order repeat BNP.  Monitor I's/O's, daily standing weights.  Net +415cc  -285.5cc   Wt   71.4kg  71.6kgkg.    3. HTN, poorly controlled --BP uncontrolled.  Continue bisoprolol 10 mg daily, hydralazine, and Imdur.     Will increase Imdur to '120mg'$  daily, given current CP.  Recommend increase in hydralazine to '100mg'$  BID for further BP support for now.    Consider restart of amlodipine for additional support.  Discontinue fluids, given volume overloaded as above.  Titrate current antihypertensives as needed for optimize BP control.    No ACE/ARB/Arni 2/2 renal function.  5. AOCKD  Caution with nephrotoxins.   Will discontinue fluids given volume status.  No ACE/ARB/Arni 2/2 renal function.  Further recommendations Per nephrology consultation.  6. Anemia, likely of chronic dz --Daily CBC on IV heparin.  --H&H stable.  7. Hypertriglyceridemia --Tg 178. Continue current increased statin for risk factor modification.  If triglycerides remain elevated at recheck, consider Vascepa.  8. HLD --LDL 110. Continue current increased statin for risk factor modification. Titrate as needed for LDL  Below  70.  9. DM2 / Hyperglycemia --Poorly controlled A1C 9.7. --Continue SSI. --Glycemic control recommended for risk factor modification. --Per IM, PCP.    For questions or updates, please contact [place] Please consult [email or web address] for contact info under        Signed, Arvil Chaco, PA-C  07/18/2020, 8:41 AM

## 2020-07-18 NOTE — Progress Notes (Signed)
Spoke to Dr. Posey Pronto this am.  Patient with blood sugars in the 30s overnight and symptomatic.  Patient did not want to take insulin this am.  Per Dr. Posey Pronto, will hold insulin today and continue to monitor finger sticks.

## 2020-07-18 NOTE — Progress Notes (Addendum)
Progress Note  Patient Name: Jimmy Olson Date of Encounter: 07/18/2020  [facility] Cardiologist: Ida Rogue, MD   Subjective   Reports current chest pain, as well as a throbbing in his chest.  He reports this is still constant but improved in severity from yesterday.  Currently short of breath at rest. On 2L [place] oxygen. No O2 at home. Reports symptoms of orthopnea, stating that he feels as if he is choking when laying flat.  Boughing up greater amounts of white phlegm.  Reports concern regarding his upcoming catheterization and finances.  States that he is very worried about the procedure and requests if his wife, Verdis Frederickson, is able to accompany him.  His son has schizophrenia and is not able to visit him and his daughter is currently busy working.  Inpatient Medications    Scheduled Meds: . aspirin EC  81 mg Oral Daily  . bisoprolol  10 mg Oral Daily  . citalopram  20 mg Oral Daily  . feeding supplement (GLUCERNA SHAKE)  237 mL Oral BID BM  . hydrALAZINE  50 mg Oral BID  . insulin aspart  0-5 Units Subcutaneous QHS  . insulin aspart  0-9 Units Subcutaneous TID WC  . insulin detemir  30 Units Subcutaneous BID  . isosorbide mononitrate  60 mg Oral Daily  . multivitamin with minerals  1 tablet Oral Daily  . pantoprazole  40 mg Oral Daily  . rosuvastatin  40 mg Oral Daily  . sodium chloride flush  3 mL Intravenous Q12H   Continuous Infusions: . sodium chloride    . heparin     PRN Meds: sodium chloride, acetaminophen, albuterol, alum & mag hydroxide-simeth, calcium carbonate, dextromethorphan-guaiFENesin, hydrALAZINE, nitroGLYCERIN, ondansetron (ZOFRAN) IV, ondansetron, sodium chloride flush   Vital Signs    Vitals:   07/17/20 1717 07/17/20 1938 07/18/20 0403 07/18/20 0814  BP: (!) 147/80 (!) 155/90 131/75 (!) 174/99  Pulse: 69 71 (!) 59 64  Resp: '18 19 19 18  '$ Temp: 98.6 F (37 C) 98.6 F (37 C) 97.8 F (36.6 C) (!) 97.5 F (36.4 C)  TempSrc: Oral   Oral   SpO2: 100% 100% 96% 100%  Weight:   71.6 kg   Height:        Intake/Output Summary (Last 24 hours) at 07/18/2020 0841 Last data filed at 07/18/2020 0408 Gross per 24 hour  Intake 514.51 ml  Output 800 ml  Net -285.49 ml   Last 3 Weights 07/18/2020 07/17/2020 07/15/2020  Weight (lbs) 157 lb 14.4 oz 157 lb 4.8 oz 154 lb 12.2 oz  Weight (kg) 71.623 kg 71.351 kg 70.2 kg      Telemetry    NSR, predominantly 60s to 70s- Personally Reviewed  ECG    No new tracings - Personally Reviewed  Physical Exam   GEN: No acute distress.   Neck: No JVD Cardiac: RRR, no murmurs, rubs, or gallops.  Respiratory:  Faint bibasilar crackles. GI: Soft, nontender, non-distended  MS: No edema; No deformity. Neuro:  Nonfocal  Psych: Normal affect   Labs    High Sensitivity Troponin:   Recent Labs  Lab 07/15/20 0535 07/15/20 1124 07/15/20 1943 07/15/20 2240 07/17/20 1026  TROPONINIHS 127* 176* 190* 200* 133*      Chemistry Recent Labs  Lab 07/15/20 0359 07/16/20 0532 07/17/20 0615 07/18/20 0552  NA 141 144 143 140  K 3.7 3.4* 4.0 4.0  CL 108 106 106 105  CO2 '25 29 29 25  '$ GLUCOSE 245* 145*  94 160*  BUN 42* 46* 55* 55*  CREATININE 3.00* 2.96* 3.41* 3.25*  CALCIUM 8.5* 8.8* 9.4 8.9  PROT 6.3*  --   --   --   ALBUMIN 3.0*  --   --   --   AST 18  --   --   --   ALT 15  --   --   --   ALKPHOS 81  --   --   --   BILITOT 0.5  --   --   --   GFRNONAA 23* 24* 20* 21*  ANIONGAP '8 9 8 10     '$ Hematology Recent Labs  Lab 07/16/20 0532 07/17/20 0615 07/17/20 1930 07/18/20 0552  WBC 7.8 7.9  --  8.2  RBC 3.91* 4.01* 3.39* 3.92*  HGB 11.7* 12.1*  --  11.8*  HCT 33.7* 34.8*  --  34.5*  MCV 86.2 86.8  --  88.0  MCH 29.9 30.2  --  30.1  MCHC 34.7 34.8  --  34.2  RDW 12.2 12.0  --  12.0  PLT 232 248  --  216    BNP Recent Labs  Lab 07/15/20 0359  BNP 834.1*     DDimer No results for input(s): DDIMER in the last 168 hours.   Radiology    DG Chest 1 View  Result Date:  07/17/2020 CLINICAL DATA:  60 year old male with a history of acute tubular necrosis EXAM: CHEST  1 VIEW COMPARISON:  07/15/2020 FINDINGS: Cardiomediastinal silhouette unchanged in size and contour. Low lung volumes with slight improved aeration at the lung bases. No pneumothorax or pleural effusion. IMPRESSION: Slight improved airspace opacity at the right lung base with persisting low lung volumes. Electronically Signed   By: Corrie Mckusick D.O.   On: 07/17/2020 12:43   US RENAL  Result Date: 07/17/2020 CLINICAL DATA:  Acute tubular necrosis, history diabetes mellitus, hypertension, cardiomyopathy EXAM: RENAL / URINARY TRACT ULTRASOUND COMPLETE COMPARISON:  CT abdomen and pelvis 02/01/2007 FINDINGS: Right Kidney: Renal measurements: 11.1 x 5.5 x 5.2 cm = volume: 167 mL. Normal cortical thickness. Increased cortical echogenicity. No mass, hydronephrosis, or shadowing calcification. Left Kidney: Renal measurements: 10.5 x 5.8 x 7.3 cm = volume: 234 mL. Normal cortical thickness. Increased cortical echogenicity. No mass, hydronephrosis, or shadowing calcification. Bladder: Appears normal for degree of bladder distention. Other: Prostate gland enlarged 5.1 x 4.2 x 5.3 cm (volume = 59 cm^3) IMPRESSION: Medical renal disease changes of both kidneys. No evidence of renal mass or hydronephrosis. Prostatic enlargement. Electronically Signed   By: Lavonia Dana M.D.   On: 07/17/2020 17:02   ECHOCARDIOGRAM COMPLETE  Result Date: 07/16/2020    ECHOCARDIOGRAM REPORT   Patient Name:   Jimmy Olson Date of Exam: 07/16/2020 Medical Rec #:  [id number]     Height:       65.0 in Accession #:    [id number]    Weight:       154.8 lb Date of Birth:  07-12-1960     BSA:          1.774 m Patient Age:    60 years      BP:           152/92 mmHg Patient Gender: M             HR:           69 bpm. Exam Location:  [facility] Procedure: 2D Echo, Color Doppler, Cardiac Doppler and Strain Analysis Indications:  [id number] CHF-Acute Systolic   History:         Patient has prior history of Echocardiogram examinations, most                  recent 09/11/2019. HFpEF and Cardiomyopathy; Risk                  Factors:Hypertension and Diabetes. Pt had COVID-19 01/2020.  Sonographer:     Charmayne Sheer RDCS (AE) Referring Phys:  [id number] Ivor Costa Diagnosing Phys: Ida Rogue MD  Sonographer Comments: Global longitudinal strain was attempted. IMPRESSIONS  1. Left ventricular ejection fraction, by estimation, is 30 to 35%. The left ventricle has moderately decreased function. The left ventricle demonstrates global hypokinesis. The left ventricular internal cavity size was moderately dilated. There is mild  left ventricular hypertrophy. Left ventricular diastolic parameters are consistent with Grade II diastolic dysfunction (pseudonormalization). The average left ventricular global longitudinal strain is -10.1 %. The global longitudinal strain is abnormal.  2. Right ventricular systolic function is normal. The right ventricular size is normal. Tricuspid regurgitation signal is inadequate for assessing PA pressure.  3. Left atrial size was mildly dilated.  4. The inferior vena cava is normal in size with greater than 50% respiratory variability, suggesting right atrial pressure of 3 mmHg. FINDINGS  Left Ventricle: Left ventricular ejection fraction, by estimation, is 30 to 35%. The left ventricle has moderately decreased function. The left ventricle demonstrates global hypokinesis. The average left ventricular global longitudinal strain is -10.1 %. The global longitudinal strain is abnormal. The left ventricular internal cavity size was moderately dilated. There is mild left ventricular hypertrophy. Left ventricular diastolic parameters are consistent with Grade II diastolic dysfunction (pseudonormalization). Right Ventricle: The right ventricular size is normal. No increase in right ventricular wall thickness. Right ventricular systolic function is normal. Tricuspid  regurgitation signal is inadequate for assessing PA pressure. Left Atrium: Left atrial size was mildly dilated. Right Atrium: Right atrial size was normal in size. Pericardium: There is no evidence of pericardial effusion. Mitral Valve: The mitral valve is normal in structure. Mild mitral valve regurgitation. No evidence of mitral valve stenosis. MV peak gradient, 2.8 mmHg. The mean mitral valve gradient is 2.0 mmHg. Tricuspid Valve: The tricuspid valve is normal in structure. Tricuspid valve regurgitation is not demonstrated. No evidence of tricuspid stenosis. Aortic Valve: The aortic valve was not well visualized. Aortic valve regurgitation is not visualized. No aortic stenosis is present. Aortic valve mean gradient measures 3.0 mmHg. Aortic valve peak gradient measures 5.1 mmHg. Aortic valve area, by VTI measures 2.34 cm. Pulmonic Valve: The pulmonic valve was normal in structure. Pulmonic valve regurgitation is not visualized. No evidence of pulmonic stenosis. Aorta: The aortic root is normal in size and structure. Venous: The inferior vena cava is normal in size with greater than 50% respiratory variability, suggesting right atrial pressure of 3 mmHg. IAS/Shunts: No atrial level shunt detected by color flow Doppler.  LEFT VENTRICLE PLAX 2D LVIDd:         5.50 cm      Diastology LVIDs:         4.50 cm      LV e' medial:    4.35 cm/s LV PW:         1.20 cm      LV E/e' medial:  19.8 LV IVS:        0.70 cm      LV e' lateral:   5.55 cm/s LVOT diam:  2.20 cm      LV E/e' lateral: 15.5 LV SV:         54 LV SV Index:   30           2D Longitudinal Strain LVOT Area:     3.80 cm     2D Strain GLS (A2C):   -10.5 %                             2D Strain GLS (A3C):   -10.4 %                             2D Strain GLS (A4C):   -9.3 % LV Volumes (MOD)            2D Strain GLS Avg:     -10.1 % LV vol d, MOD A2C: 170.0 ml LV vol d, MOD A4C: 169.0 ml LV vol s, MOD A2C: 103.0 ml LV vol s, MOD A4C: 101.0 ml LV SV MOD A2C:      67.0 ml LV SV MOD A4C:     169.0 ml LV SV MOD BP:      69.6 ml LEFT ATRIUM           Index LA diam:      4.00 cm 2.25 cm/m LA Vol (A4C): 47.0 ml 26.50 ml/m  AORTIC VALVE                   PULMONIC VALVE AV Area (Vmax):    2.37 cm    PV Vmax:       0.63 m/s AV Area (Vmean):   2.48 cm    PV Vmean:      44.500 cm/s AV Area (VTI):     2.34 cm    PV VTI:        0.124 m AV Vmax:           113.00 cm/s PV Peak grad:  1.6 mmHg AV Vmean:          79.000 cm/s PV Mean grad:  1.0 mmHg AV VTI:            0.229 m AV Peak Grad:      5.1 mmHg AV Mean Grad:      3.0 mmHg LVOT Vmax:         70.50 cm/s LVOT Vmean:        51.600 cm/s LVOT VTI:          0.141 m LVOT/AV VTI ratio: 0.62  AORTA Ao Root diam: 3.00 cm MITRAL VALVE MV Area (PHT): 3.20 cm    SHUNTS MV Area VTI:   2.07 cm    Systemic VTI:  0.14 m MV Peak grad:  2.8 mmHg    Systemic Diam: 2.20 cm MV Mean grad:  2.0 mmHg MV Vmax:       0.84 m/s MV Vmean:      61.0 cm/s MV Decel Time: 237 msec MV E velocity: 86.10 cm/s MV A velocity: 60.40 cm/s MV E/A ratio:  1.43 Ida Rogue MD Electronically signed by Ida Rogue MD Signature Date/Time: 07/16/2020/11:09:56 AM    Final     Cardiac Studies   Echocardiogram 07/16/2020 1. Left ventricular ejection fraction, by estimation, is 30 to 35%. The  left ventricle has moderately decreased function. The left ventricle  demonstrates global hypokinesis. The left ventricular internal cavity size  was  moderately dilated. There is mild  left ventricular hypertrophy. Left ventricular diastolic parameters are  consistent with Grade II diastolic dysfunction (pseudonormalization). The  average left ventricular global longitudinal strain is -10.1 %. The global  longitudinal strain is abnormal.  2. Right ventricular systolic function is normal. The right ventricular  size is normal. Tricuspid regurgitation signal is inadequate for assessing  PA pressure.  3. Left atrial size was mildly dilated.  4. The inferior vena  cava is normal in size with greater than 50%  respiratory variability, suggesting right atrial pressure of 3 mmHg.   MPI 07/2009  1. Normal treadmill EKG without evidence of ischemia or arrhythmia.  2. Good exercise tolerance for age.  3. Normal LV systolic function. Ejection fraction 48%.  4. Normal myocardial perfusion without evidence of myocardial  ischemia.   Patient Profile     60 y.o. male  with a hx of previous LV dysfunction with most recent 07/16/20 echo LVEF decreased to 30-35%, history of COVID-19 infection 03/06/2020, poorly controlled hypertension, hyperlipidemia, DM2, CKD, and who is being seen today for the evaluation of newly reduced EF at the request of Dr. Karleen Hampshire.  Assessment & Plan    1. NSTEMI --Reports almost constant chest pain since his previous 08/2019 admission. LVEF 30-35% by most recent echo.  HS Tn peaked 200, now down-trending. Given newly reduced EF and chest pain, further ischemic work-up recommended with plan for [facility] after discussion with rounding MDs and nephrology. Risk factors for cardiac etiology include male, recently reduced EF without recent ischemic work-up, poorly controlled DM2, hypertension.   Continue IV heparin.  Daily CBC.    Continue   ASA 81 mg qd  Bisoprolol 10 mg qd   Increased Crestor 40 mg qd   PRN SL nitro for CP  Imdur, to be increased to 120 mg daily given ongoing CP with room in BP.  No ACE/ARB/Arni/MRA given CKD.    R/LHC tentatively scheduled for 1/31.    NPO after midnight 1/30  Recommend R/LHC for both further ischemic evaluation and better understanding of hemodynamics to assist with ongoing diuresis given renal function.  Daily BMET.   Started on very gentle IVF yesterday, given his renal function.  As below, appears volume overloaded today.  Will discontinue fluids and discussed with MD.   Current Cr 3.41  3.25 with BUN 55  55.  Nephrology consulted as below.  2. HFrEF --Reports  shortness of breath with exertion.  Previously noted to have hypertensive heart dz. EF down from 45-50%   30 to 35% with GLS abnl at -10.1%. Also noted was mild LVH, mild LVH, G2 DD, mild LAE. LHC recommended as above per MD.    Volume up on exam.  Started on gentle IVF yesterday due to renal function.  Volume up on exam today with elevated pressure and report of shortness of breath and orthopnea.  Currently on 2 L nasal cannula oxygen.  He does not use oxygen at home.  Will discuss restart of lasix/ to be discussed with MD.  Continue oxygen as needed.  Pending [facility] 1/31 as above.   NPO after midnight.  Recommend both R/LHC given volume status.  Recommend caution with IVF given reduced EF.  Escalation of GDMT precluded by renal function.   ReDS vest. Trend BNP.  Will order repeat BNP.  Monitor I's/O's, daily standing weights.  Net +415cc  -285.5cc   Wt   71.4kg  71.6kgkg.    3. HTN, poorly controlled --BP uncontrolled.  Continue bisoprolol 10 mg daily, hydralazine, and Imdur.     Will increase Imdur to '120mg'$  daily, given current CP.  Recommend increase in hydralazine to '100mg'$  BID for further BP support for now.    Consider restart of amlodipine for additional support.  Discontinue fluids, given volume overloaded as above.  Titrate current antihypertensives as needed for optimize BP control.    No ACE/ARB/Arni 2/2 renal function.  5. AOCKD  Caution with nephrotoxins.   Will discontinue fluids given volume status.  No ACE/ARB/Arni 2/2 renal function.  Further recommendations Per nephrology consultation.  6. Anemia, likely of chronic dz --Daily CBC on IV heparin.  --H&H stable.  7. Hypertriglyceridemia --Tg 178. Continue current increased statin for risk factor modification.  If triglycerides remain elevated at recheck, consider Vascepa.  8. HLD --LDL 110. Continue current increased statin for risk factor modification. Titrate as needed for LDL  Below  70.  9. DM2 / Hyperglycemia --Poorly controlled A1C 9.7. --Continue SSI. --Glycemic control recommended for risk factor modification. --Per IM, PCP.    For questions or updates, please contact [place] Please consult [email or web address] for contact info under        Signed, Arvil Chaco, PA-C  07/18/2020, 8:41 AM

## 2020-07-19 ENCOUNTER — Encounter: Payer: Self-pay | Admitting: Family Medicine

## 2020-07-19 ENCOUNTER — Encounter
Admission: EM | Disposition: A | Discharge status: Home / Self Care | Payer: Self-pay | Source: Home / Self Care | Attending: Internal Medicine

## 2020-07-19 DIAGNOSIS — F332 Major depressive disorder, recurrent severe without psychotic features: Secondary | ICD-10-CM | POA: Diagnosis not present

## 2020-07-19 DIAGNOSIS — I2510 Atherosclerotic heart disease of native coronary artery without angina pectoris: Secondary | ICD-10-CM | POA: Diagnosis not present

## 2020-07-19 DIAGNOSIS — I5023 Acute on chronic systolic (congestive) heart failure: Secondary | ICD-10-CM | POA: Diagnosis not present

## 2020-07-19 DIAGNOSIS — I214 Non-ST elevation (NSTEMI) myocardial infarction: Secondary | ICD-10-CM | POA: Diagnosis not present

## 2020-07-19 DIAGNOSIS — N189 Chronic kidney disease, unspecified: Secondary | ICD-10-CM

## 2020-07-19 DIAGNOSIS — I10 Essential (primary) hypertension: Secondary | ICD-10-CM | POA: Diagnosis not present

## 2020-07-19 HISTORY — PX: RIGHT/LEFT HEART CATH AND CORONARY ANGIOGRAPHY: CATH118266

## 2020-07-19 LAB — GLUCOSE, CAPILLARY
Glucose-Capillary: 177 mg/dL — ABNORMAL HIGH (ref 70–99)
Glucose-Capillary: 302 mg/dL — ABNORMAL HIGH (ref 70–99)
Glucose-Capillary: 258 mg/dL — ABNORMAL HIGH (ref 70–99)
Glucose-Capillary: 260 mg/dL — ABNORMAL HIGH (ref 70–99)

## 2020-07-19 LAB — BASIC METABOLIC PANEL
Anion gap: 11 (ref 5–15)
BUN: 62 mg/dL — ABNORMAL HIGH (ref 6–20)
CO2: 23 mmol/L (ref 22–32)
Calcium: 8.9 mg/dL (ref 8.9–10.3)
Chloride: 105 mmol/L (ref 98–111)
Creatinine, Ser: 3.64 mg/dL — ABNORMAL HIGH (ref 0.61–1.24)
GFR, Estimated: 19 mL/min — ABNORMAL LOW (ref >60)
Glucose, Bld: 173 mg/dL — ABNORMAL HIGH (ref 70–99)
Potassium: 4.4 mmol/L (ref 3.5–5.1)
Sodium: 139 mmol/L (ref 135–145)

## 2020-07-19 LAB — CBC
HCT: 31.6 % — ABNORMAL LOW (ref 39.0–52.0)
Hemoglobin: 10.9 g/dL — ABNORMAL LOW (ref 13.0–17.0)
MCH: 30.2 pg (ref 26.0–34.0)
MCHC: 34.5 g/dL (ref 30.0–36.0)
MCV: 87.5 fL (ref 80.0–100.0)
Platelets: 219 10*3/uL (ref 150–400)
RBC: 3.61 MIL/uL — ABNORMAL LOW (ref 4.22–5.81)
RDW: 12.1 % (ref 11.5–15.5)
WBC: 6.6 10*3/uL (ref 4.0–10.5)
nRBC: 0 % (ref 0.0–0.2)

## 2020-07-19 LAB — HEPARIN LEVEL (UNFRACTIONATED)
Heparin Unfractionated: 0.86 IU/mL — ABNORMAL HIGH (ref 0.30–0.70)
Heparin Unfractionated: 0.65 IU/mL (ref 0.30–0.70)

## 2020-07-19 SURGERY — RIGHT/LEFT HEART CATH AND CORONARY ANGIOGRAPHY
Anesthesia: Moderate Sedation

## 2020-07-19 MED ORDER — SODIUM CHLORIDE 0.9 % IV SOLN
250.0000 mL | INTRAVENOUS | Status: DC | PRN
Start: 2020-07-19 — End: 2020-07-25

## 2020-07-19 MED ORDER — SODIUM CHLORIDE 0.9% FLUSH
3.0000 mL | INTRAVENOUS | Status: DC | PRN
  Administered 2020-07-23 (×2): 3 mL via INTRAVENOUS

## 2020-07-19 MED ORDER — HEPARIN (PORCINE) IN NACL 1000-0.9 UT/500ML-% IV SOLN
INTRAVENOUS | Status: AC
  Filled 2020-07-19: qty 1000

## 2020-07-19 MED ORDER — VERAPAMIL HCL 2.5 MG/ML IV SOLN
INTRAVENOUS | Status: AC
  Filled 2020-07-19: qty 2

## 2020-07-19 MED ORDER — INSULIN DETEMIR 100 UNIT/ML ~~LOC~~ SOLN
20.0000 [IU] | Freq: Every day | SUBCUTANEOUS | Status: DC
  Administered 2020-07-19 – 2020-07-20 (×2): 20 [IU] via SUBCUTANEOUS
  Filled 2020-07-19 (×2): qty 0.2

## 2020-07-19 MED ORDER — SODIUM CHLORIDE 0.9 % IV SOLN: INTRAVENOUS | Status: AC

## 2020-07-19 MED ORDER — SODIUM CHLORIDE 0.9% FLUSH
3.0000 mL | Freq: Two times a day (BID) | INTRAVENOUS | Status: DC
  Administered 2020-07-19 – 2020-07-25 (×13): 3 mL via INTRAVENOUS

## 2020-07-19 MED ORDER — IOHEXOL 300 MG/ML  SOLN
INTRAMUSCULAR | Status: DC | PRN
  Administered 2020-07-19: 40 mL

## 2020-07-19 MED ORDER — HEPARIN (PORCINE) IN NACL 1000-0.9 UT/500ML-% IV SOLN
INTRAVENOUS | Status: DC | PRN
  Administered 2020-07-19: 500 mL

## 2020-07-19 MED ORDER — HEPARIN SODIUM (PORCINE) 5000 UNIT/ML IJ SOLN
5000.0000 [IU] | Freq: Three times a day (TID) | INTRAMUSCULAR | Status: DC
  Administered 2020-07-19 – 2020-07-25 (×16): 5000 [IU] via SUBCUTANEOUS
  Filled 2020-07-19 (×17): qty 1

## 2020-07-19 MED ORDER — VERAPAMIL HCL 2.5 MG/ML IV SOLN
INTRAVENOUS | Status: DC | PRN
  Administered 2020-07-19: 2.5 mg via INTRAVENOUS

## 2020-07-19 MED ORDER — FENTANYL CITRATE (PF) 100 MCG/2ML IJ SOLN
INTRAMUSCULAR | Status: DC | PRN
  Administered 2020-07-19: 25 ug via INTRAVENOUS

## 2020-07-19 MED ORDER — HEPARIN SODIUM (PORCINE) 1000 UNIT/ML IJ SOLN
INTRAMUSCULAR | Status: DC | PRN
  Administered 2020-07-19: 3500 [IU] via INTRAVENOUS

## 2020-07-19 MED ORDER — HEPARIN (PORCINE) 25000 UT/250ML-% IV SOLN: 600.0000 [IU]/h | INTRAVENOUS | Status: DC

## 2020-07-19 MED ORDER — MIDAZOLAM HCL 2 MG/2ML IJ SOLN
INTRAMUSCULAR | Status: DC | PRN
  Administered 2020-07-19: 1 mg via INTRAVENOUS

## 2020-07-19 MED ORDER — HEPARIN SODIUM (PORCINE) 1000 UNIT/ML IJ SOLN
INTRAMUSCULAR | Status: AC
  Filled 2020-07-19: qty 1

## 2020-07-19 MED ORDER — INSULIN DETEMIR 100 UNIT/ML ~~LOC~~ SOLN
20.0000 [IU] | Freq: Two times a day (BID) | SUBCUTANEOUS | Status: DC
  Filled 2020-07-19 (×2): qty 0.2

## 2020-07-19 MED ORDER — FENTANYL CITRATE (PF) 100 MCG/2ML IJ SOLN
INTRAMUSCULAR | Status: AC
  Filled 2020-07-19: qty 2

## 2020-07-19 MED ORDER — MIDAZOLAM HCL 2 MG/2ML IJ SOLN
INTRAMUSCULAR | Status: AC
  Filled 2020-07-19: qty 2

## 2020-07-19 SURGICAL SUPPLY — 10 items
CATH BALLN WEDGE 5F 110CM (CATHETERS) ×2 IMPLANT
CATH INFINITI 5 FR JL3.5 (CATHETERS) ×2 IMPLANT
CATH INFINITI 5FR JK (CATHETERS) ×2 IMPLANT
DEVICE RAD TR BAND REGULAR (VASCULAR PRODUCTS) ×2 IMPLANT
GLIDESHEATH SLEND SS 6F .021 (SHEATH) ×2 IMPLANT
GUIDEWIRE INQWIRE 1.5J.035X260 (WIRE) ×1 IMPLANT
INQWIRE 1.5J .035X260CM (WIRE) ×2
KIT MANI 3VAL PERCEP (MISCELLANEOUS) ×2 IMPLANT
PACK CARDIAC CATH (CUSTOM PROCEDURE TRAY) ×2 IMPLANT
SHEATH GLIDE SLENDER 4/5FR (SHEATH) ×2 IMPLANT

## 2020-07-19 NOTE — Progress Notes (Signed)
ANTICOAGULATION CONSULT NOTE  Pharmacy Consult for Heparin  Indication: chest pain/ACS  No Known Allergies  Heparin Dosing Weight: 70.2 kg    Labs: Recent Labs    07/17/20 0615 07/17/20 1026 07/17/20 1445 07/18/20 0552 07/18/20 1622 07/19/20 0152  HGB 12.1*  --   --  11.8*  --  10.9*  HCT 34.8*  --   --  34.5*  --  31.6*  PLT 248  --   --  216  --  219  HEPARINUNFRC 0.51  --    < > 1.16* 0.83* 0.86*  CREATININE 3.41*  --   --  3.25*  --  3.64*  TROPONINIHS  --  133*  --   --   --   --    < > = values in this interval not displayed.    Estimated Creatinine Clearance: 19.5 mL/min (A) (by C-G formula based on SCr of 3.64 mg/dL (H)).   Medical History: Past Medical History:  Diagnosis Date  . Cardiomyopathy ([place])    a. 07/2009 MV: EF 48%; b. 08/2019 Echo: EF 45-50%. Global HK. Mod LVH. Gr1 DD. Nl RV fxn. Nl PASP.  Marland Kitchen Chest pain    a. 07/2009 MV: EF 48%. No ischemia.  . Diabetes mellitus without complication ([place])   . HFmrEF (heart failure with mildy reduced ejection fraction) ([place])   . Hypertension     Assessment: 60yo male with past medical history significant ofsCHF withEF of 45 to 50%, hypertension, hyperlipidemia, diabetes mellitus, COVID-19 infection 4 months ago, CKD 3B, who presented with shortness of breath. He was found to have an elevated BNP at 834.1, troponins elevated at 124>127>200>133. Pharmacy consulted to dose heparin in this 60 year old male admitted with ACS/NSTEMI.  No prior anticoag noted.   Baseline labs: Plt 223, Hgb 11.3, Hct 34.6 H&H and Plt remain stable  1/28 2112  HL 0.19, subtherapeutic 1/29 0615 HL 0.51, therapeutic x1 1/29 1445 HL 0.37, therapeutic x2 1/30 0552 HL 1.16, supratherapeutic - held for 1 hr, rate decreased to 900 units/hr 1/30 1622 HL 0.83, supratherapeutic --  1/30 0152 HL 0.86, supratherapeutic  Goal of Therapy:  Heparin level 0.3-0.7 units/ml Monitor platelets by anticoagulation protocol: Yes   Plan:  HL 0.86  slightly supratherapeutic. Will decrease infusion rate to 600 units/hr  Recheck HL in 8 hrs. CBC daily while on heparin drip.  Renda Rolls, PharmD, [facility] 07/19/2020 3:21 AM

## 2020-07-19 NOTE — Progress Notes (Signed)
[facility] NAME: Jimmy Olson    MR#:  [id number]  DATE OF BIRTH:  Jul 28, 1960  SUBJECTIVE:   Patient seen via [place] video interpreter.  Patient just got back from cardiac cath. Denies any chest pain. Sugars trending up. Patient eating reasonably well. REVIEW OF SYSTEMS:   Review of Systems  Constitutional: Negative for chills, fever and weight loss.  HENT: Negative for ear discharge, ear pain and nosebleeds.   Eyes: Negative for blurred vision, pain and discharge.  Respiratory: Positive for shortness of breath. Negative for sputum production, wheezing and stridor.   Cardiovascular: Positive for chest pain. Negative for palpitations, orthopnea and PND.  Gastrointestinal: Negative for diarrhea, nausea and vomiting.  Genitourinary: Negative for frequency and urgency.  Musculoskeletal: Negative for back pain and joint pain.  Neurological: Negative for sensory change, speech change, focal weakness and weakness.  Psychiatric/Behavioral: Negative for depression and hallucinations. The patient is not nervous/anxious.    Tolerating Diet:yesTolerating PT:   DRUG ALLERGIES:  No Known Allergies  VITALS:  Blood pressure (!) 147/71, pulse 72, temperature 98 F (36.7 C), temperature source Oral, resp. rate 20, height '5\' 5"'$  (1.651 m), weight 73.7 kg, SpO2 98 %.  PHYSICAL EXAMINATION:   Physical Exam  General exam: Appears calm and comfortable  Respiratory system: basilar crackles and on 2 lit of [place] oxygen, rhonchi wheezing no respiratory distress noted. Cardiovascular system: S1 & S2 heard, RRR. No JVD, . No pedal edema. Gastrointestinal system: Abdomen is nondistended, soft and nontender.. Normal bowel sounds heard. Central nervous system: Alert and oriented. No focal neurological deficits. Extremities: Symmetric 5 x 5 power. Skin: No rashes, lesions or ulcers Psychiatry:  Mood & affect appropriate.  LABORATORY PANEL:  CBC Recent  Labs  Lab 07/19/20 0152  WBC 6.6  HGB 10.9*  HCT 31.6*  PLT 219    Chemistries  Recent Labs  Lab 07/15/20 0359 07/16/20 0532 07/17/20 0615 07/19/20 0152  NA 141 144   < > 139  K 3.7 3.4*   < > 4.4  CL 108 106   < > 105  CO2 25 29   < > 23  GLUCOSE 245* 145*   < > 173*  BUN 42* 46*   < > 62*  CREATININE 3.00* 2.96*   < > 3.64*  CALCIUM 8.5* 8.8*   < > 8.9  MG  --  1.9  --   --   AST 18  --   --   --   ALT 15  --   --   --   ALKPHOS 81  --   --   --   BILITOT 0.5  --   --   --    < > = values in this interval not displayed.   Cardiac Enzymes No results for input(s): TROPONINI in the last 168 hours. RADIOLOGY:  DG Chest 1 View  Result Date: 07/18/2020 CLINICAL DATA:  In patient follow-up of acute tubular necrosis. EXAM: CHEST  1 VIEW COMPARISON:  Yesterday FINDINGS: Borderline heart size. Unremarkable aortic and hilar contours. There is no edema, consolidation, effusion, or pneumothorax. IMPRESSION: Low volume but clear lungs. Electronically Signed   By: Monte Fantasia M.D.   On: 07/18/2020 11:35   CARDIAC CATHETERIZATION  Result Date: 07/19/2020  Prox RCA lesion is 20% stenosed.  Mid RCA lesion is 30% stenosed.  Dist RCA lesion is 50% stenosed with 50% stenosed side branch in RPAV.  Prox LAD  lesion is 40% stenosed.  1st Diag lesion is 70% stenosed.  Mid LAD lesion is 60% stenosed.  1.  Moderately severely calcified coronary arteries with moderate two-vessel coronary artery disease involving the LAD and right coronary artery.  No evidence of critical stenosis. 2.  Right heart catheterization showed mildly elevated filling pressures, mild pulmonary hypertension and normal cardiac output. 3.  Left ventricular angiography was not performed due to chronic kidney disease.  EF was moderately reduced by echo. Recommendations: Recommend aggressive medical therapy for nonobstructive coronary artery disease.  Cardiomyopathy seems to be out of proportion to coronary artery disease.  Monitor for contrast-induced nephropathy.  Only 40 mL of contrast was used.  We will do gentle hydration for 6 hours.  I suspect that he will require an oral diuretic by tomorrow.   ASSESSMENT AND PLAN:  Jimmy Olson a 60 y.o.malewith medical history significant ofsCHF withEF of 45 to 50%, hypertension, hyperlipidemia, diabetes mellitus, COVID-19 infection 4 months ago, CKD 3B, who presents with shortness of breath.  Patient reports having shortness of breath for more than 2 days associated with intermittent chest pain and chest tightness.  Chest x-ray shows interstitial pulmonary edema with possible small right pleural effusion.  Patient admitted for acute on chronic systolic and diastolic heart failure.  Acute respiratory Failure with hypoxia Acute on chronic systolic and diastolic heart failure.  -- requiring up to 2 lit of [place] oxygen to keep sats greater than 90%.  --on IV lasix 40 mg BID--now d/ced --Cardiology [facility] consulted-- Recommends ischemic workup with left heart catheterization plan for Monday 1/31 -- strict intake and output. Daily weights.  --2 d echo shows worsening of the left ventricular EF form 40 to 45% to 30 to 35%.  With grade 2 diastolic dysfunction.  NSTEMI --Intermittent chest pain and elevated troponins.  -- Patient was on IV heparin -- continue with aspirin and imdur.  --EKG shows t wave inversions in the lateral leads and minimal ST elevation --cath showed Moderately severely calcified coronary arteries with moderate two-vessel coronary artery disease involving the LAD and right coronary artery.  No evidence of critical stenosis. 2.  Right heart catheterization showed mildly elevated filling pressures, mild pulmonary hypertension and normal cardiac output. 3.  Left ventricular angiography was not performed due to chronic kidney disease.  EF was moderately reduced by echo. -- LG recommends medical management   Type 2 DM:  a1C IS 9.7% sugars rising. Will  resume Levemir 20 units along with sliding scale  Hypokalemia:  Replace. K   AKI: superimposed on CKD stage IIIB -Baseline creatinine appears to be between 1.8 to 2.  -Admitted with a creatinine of 3 and creatinine REMAINS AT 3.41  -nephrology consultation with Dr. Mechele Collin  -- came in with creatinine of 3.41--- 3.25 -- 3.64 -- cardiac cath-- BMP tomorrow -- avoid nephrotoxins  hyperlipidemia:  LDL is 110. - Increase crestor to 40 mg daily.   Hypertension:  Bp ok   DVT prophylaxis: heparin Code Status: (Fullcode ) Family Communication: none at bedside.  Disposition: home  Status is: Inpatient  Remains inpatient appropriate because:Ongoing diagnostic w/u for NSTEMI, Acute on CKD-IIIB and chf patient is status post cardiac cath need to monitor creatinine. Receiving IV fluids per cardiology recommendation.  Dispo: The patient is from: Home  Anticipated d/c is to: Home  Anticipated d/c date is: 2-3 days  Patient currently is not medically stable to d/c.  Difficult to place patient No  Level of care: Progressive Cardiac  Needs cardiac cath 1/31 Nephrology consulted      TOTAL TIME TAKING CARE OF THIS PATIENT: 25* minutes.  >50% time spent on counselling and coordination of care  Note: This dictation was prepared with Dragon dictation along with smaller phrase technology. Any transcriptional errors that result from this process are unintentional.  Fritzi Mandes M.D    [facility]   CC: Primary care physician; [facility]: [facility], male   DOB: 09-Oct-1960, 60 y.o.   MRN: [id number]

## 2020-07-19 NOTE — Interval H&P Note (Signed)
History and Physical Interval Note:  07/19/2020 8:03 AM  Jimmy Olson  has presented today for surgery, with the diagnosis of Non ST elevation myocardial infarction.  The various methods of treatment have been discussed with the patient and family. After consideration of risks, benefits and other options for treatment, the patient has consented to  Procedure(s): RIGHT/LEFT HEART CATH AND CORONARY ANGIOGRAPHY (N/A) as a surgical intervention.  The patient's history has been reviewed, patient examined, no change in status, stable for surgery.  I have reviewed the patient's chart and labs.  Questions were answered to the patient's satisfaction.     [name]

## 2020-07-19 NOTE — Progress Notes (Signed)
Inpatient Diabetes Program Recommendations  AACE/ADA: New Consensus Statement on Inpatient Glycemic Control (2015)  Target Ranges:  Prepandial:   less than 140 mg/dL      Peak postprandial:   less than 180 mg/dL (1-2 hours)      Critically ill patients:  140 - 180 mg/dL   Results for Jimmy Olson, Jimmy Olson (MRN [id number]) as of 07/19/2020 13:40  Ref. Range 07/18/2020 02:00 07/18/2020 02:22 07/18/2020 02:42 07/18/2020 03:07 07/18/2020 08:16 07/18/2020 12:15 07/18/2020 17:07 07/18/2020 21:44  Glucose-Capillary Latest Ref Range: 70 - 99 mg/dL 36 (LL) 39 (LL) 38 (LL) 243 (H) 141 (H) 190 (H) 254 (H)  5 units NOVOLOG  212 (H)  2 units NOVOLOG    Results for Jimmy Olson, Jimmy Olson (MRN [id number]) as of 07/19/2020 13:40  Ref. Range 07/19/2020 07:23 07/19/2020 12:15  Glucose-Capillary Latest Ref Range: 70 - 99 mg/dL 177 (H) 302 (H)   Home DM Meds: Levemir 45 units BID       Humalog 5 units TID with meals  Current Orders: Levemir 10 units Daily       Novolog 0-9 units ac/hs    Severe HYPO at 2am on 01/30--NO Levemir given yest  Levemir dose drastically reduced for this AM (was 30 units BID)   MD- Note CBG was 302 at 12pm today.  Please consider:  Starting Novolog Meal Coverage after pt resumes PO diet today:  Novolog 4 units TID with meals    --Will follow patient during hospitalization--  Wyn Quaker RN, MSN, CDE Diabetes Coordinator Inpatient Glycemic Control Team Team Pager: [phone] (8a-5p)

## 2020-07-19 NOTE — Consult Note (Addendum)
   Heart Failure Nurse Navigator Note  HF rEF 30-35% Mild LVH. Grade II diastolic dysfunction.  He presented to the emergency room on July 16, 2019 with complaints of shortness of breath.  It had been going on for more than 2 days and progressively got worse.  Comorbidities:  Hypertension Hyperlipidemia Type 2 diabetes Chronic kidney disease Severe Depression  Medications:  Aspirin 81 mg daily Bisoprolol 10 mg daily Hydralazine 100 mg 2 times a day Isosorbide mononitrate 120 mg daily Crestor 40 mg daily   Lab:  Sodium 139, potassium 4.4, chloride 105, CO2 23, BUN 62 up from 55 of yesterday, creatinine 3.64 Weight is 73.7 kg BMI is 27 Blood pressure 149/71   Assessment:  General-he is awake and alert, lying in bed in no acute distress  Cardiac-heart tones of regular rate and rhythm  HEENT pupils are equal, no JVD noted.  Abdomen soft nontender  Musculoskeletal -no edema noted  Neuro-speech is clear, moves all extremities  Psych is normal makes good eye contact.   Initial visit with patient today spoke with Spanish interpreter.  Explained how to take care of himself once he goes home with low-sodium diet, no adding salt at the table and also to restrict fluid restrictions.  Stressed the importance of weighing daily and recording.  He asked good questions, was especially concerned about limiting his fluid intake with his kidney disease.  Explained that is just  have to be a fine balance.  Stressed the importance of being compliant with his medication regimen.  He viewed the heart failure videos in [place].  His only questions where " is his potassium level high today and I explained no, and will he receive written instructions when he is discharged and explained yes.   Pricilla Riffle RN, CHFN

## 2020-07-19 NOTE — Consult Note (Signed)
[facility] Consult   Reason for Consult: Follow-up for this 60 year old man seen on Friday with symptoms of depression Referring Physician: Posey Pronto Patient Identification: Jimmy Olson MRN:  [id number] Principal Diagnosis: Severe recurrent major depression without psychotic features [facility]) Diagnosis:  Principal Problem:   Severe recurrent major depression without psychotic features ([place]) Active Problems:   Essential hypertension   Elevated troponin   Type II diabetes mellitus with renal manifestations ([place])   Acute on chronic systolic CHF (congestive heart failure) ([place])   Acute renal failure superimposed on stage 3b chronic kidney disease (HCC)   HLD (hyperlipidemia)   ATN (acute tubular necrosis) (HCC)   Non-ST elevation (NSTEMI) myocardial infarction ([place])   Total Time spent with patient: 30 minutes  Subjective:   Jimmy Olson is a 60 y.o. male patient admitted with "I am good".  HPI: Patient seen with the assistance of hospital provided Spanish language interpreter.  Patient had a cardiac catheterization this morning.  There does not appear to be a plan for other interventional treatment at this time.  Patient tells me he is feeling "good".  He says he thinks that his problems were all in his heart.  He is minimizing his depressive symptoms.  Denies suicidal thoughts.  Denies feelings of hopelessness.  He still looks very flat and withdrawn tends to answer questions in single words and a monotone.  No complaints about medication.  Has not acted out or done anything to try to harm himself in the hospital  Past Psychiatric History: See previous note.  Does not seem to have had significant past treatment  Risk to Self:   Risk to Others:   Prior Inpatient Therapy:   Prior Outpatient Therapy:    Past Medical History:  Past Medical History:  Diagnosis Date  . Cardiomyopathy ([place])    a. 07/2009 MV: EF 48%; b. 08/2019 Echo: EF 45-50%. Global HK. Mod LVH. Gr1 DD. Nl RV  fxn. Nl PASP.  Marland Kitchen Chest pain    a. 07/2009 MV: EF 48%. No ischemia.  . Diabetes mellitus without complication ([place])   . HFmrEF (heart failure with mildy reduced ejection fraction) ([place])   . Hypertension     Past Surgical History:  Procedure Laterality Date  . APPENDECTOMY    . RIGHT/LEFT HEART CATH AND CORONARY ANGIOGRAPHY N/A 07/19/2020   Procedure: RIGHT/LEFT HEART CATH AND CORONARY ANGIOGRAPHY;  Surgeon: Wellington Hampshire, MD;  Location: [facility];  Service: Cardiovascular;  Laterality: N/A;   Family History:  Family History  Problem Relation Age of Onset  . Kidney failure Mother        died @ 34  . Heart failure Mother   . Other Father        he never knew his father   Family Psychiatric  History: Son with schizophrenia Social History:  Social History   Substance and Sexual Activity  Alcohol Use Never     Social History   Substance and Sexual Activity  Drug Use Never    Social History   Socioeconomic History  . Marital status: Married    Spouse name: Not on file  . Number of children: Not on file  . Years of education: Not on file  . Highest education level: Not on file  Occupational History  . Not on file  Tobacco Use  . Smoking status: Never Smoker  . Smokeless tobacco: Never Used  Substance and Sexual Activity  . Alcohol use: Never  . Drug use:  Never  . Sexual activity: Not on file  Other Topics Concern  . Not on file  Social History Narrative   Lives locally with wife and son.  He is currently unemployed - has worked in different industries.     Social Determinants of Health   Financial Resource Strain: Not on file  Food Insecurity: Not on file  Transportation Needs: Not on file  Physical Activity: Not on file  Stress: Not on file  Social Connections: Not on file   Additional Social History:    Allergies:  No Known Allergies  Labs:  Results for orders placed or performed during the hospital encounter of 07/15/20 (from the past 48  hour(s))  Vitamin B12     Status: None   Collection Time: 07/17/20  7:30 PM  Result Value Ref Range   Vitamin B-12 285 180 - 914 pg/mL    Comment: (NOTE) This assay is not validated for testing neonatal or myeloproliferative syndrome specimens for Vitamin B12 levels. Performed at [facility], [place] [address], [place], [place] [address]   Folate     Status: None   Collection Time: 07/17/20  7:30 PM  Result Value Ref Range   Folate 14.9 >5.9 ng/mL    Comment: Performed at [facility], [address] [place], [place] [address]  Iron and TIBC     Status: None   Collection Time: 07/17/20  7:30 PM  Result Value Ref Range   Iron 87 45 - 182 ug/dL   TIBC 295 250 - 450 ug/dL   Saturation Ratios 30 17.9 - 39.5 %   UIBC 208 ug/dL    Comment: Performed at [facility], [address] [place], [place] [address]  Ferritin     Status: None   Collection Time: 07/17/20  7:30 PM  Result Value Ref Range   Ferritin 63 24 - 336 ng/mL    Comment: Performed at [facility], [address] [place], [place] [address]  Reticulocytes     Status: Abnormal   Collection Time: 07/17/20  7:30 PM  Result Value Ref Range   Retic Ct Pct 1.4 0.4 - 3.1 %   RBC. 3.39 (L) 4.22 - 5.81 MIL/uL   Retic Count, Absolute 46.1 19.0 - 186.0 K/uL   Immature Retic Fract 11.0 2.3 - 15.9 %    Comment: Performed at [facility], [address] [place], [place] [address]  Glucose, capillary     Status: Abnormal   Collection Time: 07/17/20  7:39 PM  Result Value Ref Range   Glucose-Capillary 244 (H) 70 - 99 mg/dL    Comment: Glucose reference range applies only to samples taken after fasting for at least 8 hours.  Glucose, capillary     Status: Abnormal   Collection Time: 07/18/20  2:00 AM  Result Value Ref Range   Glucose-Capillary 36 (LL) 70 - 99 mg/dL    Comment: Glucose reference range applies only to samples taken after fasting for at least 8 hours.   Comment 1 Notify  RN   Glucose, capillary     Status: Abnormal   Collection Time: 07/18/20  2:22 AM  Result Value Ref Range   Glucose-Capillary 39 (LL) 70 - 99 mg/dL    Comment: Glucose reference range applies only to samples taken after fasting for at least 8 hours.   Comment 1 Notify RN   Glucose, capillary     Status: Abnormal   Collection Time: 07/18/20  2:42 AM  Result Value Ref Range   Glucose-Capillary 38 (LL) 70 - 99 mg/dL    Comment: Glucose reference range applies only to samples taken after fasting for at least 8 hours.   Comment 1 Notify RN   Glucose, capillary     Status: Abnormal   Collection Time: 07/18/20  3:07 AM  Result Value Ref Range   Glucose-Capillary 243 (H) 70 - 99 mg/dL    Comment: Glucose reference range applies only to samples taken after fasting for at least 8 hours.  Basic metabolic panel     Status: Abnormal   Collection Time: 07/18/20  5:52 AM  Result Value Ref Range   Sodium 140 135 - 145 mmol/L   Potassium 4.0 3.5 - 5.1 mmol/L   Chloride 105 98 - 111 mmol/L   CO2 25 22 - 32 mmol/L   Glucose, Bld 160 (H) 70 - 99 mg/dL    Comment: Glucose reference range applies only to samples taken after fasting for at least 8 hours.   BUN 55 (H) 6 - 20 mg/dL   Creatinine, Ser 3.25 (H) 0.61 - 1.24 mg/dL   Calcium 8.9 8.9 - 10.3 mg/dL   GFR, Estimated 21 (L) >60 mL/min    Comment: (NOTE) Calculated using the CKD-EPI Creatinine Equation (2021)    Anion gap 10 5 - 15    Comment: Performed at [facility], [address] [place], [name] [address]  Heparin level (unfractionated)     Status: Abnormal   Collection Time: 07/18/20  5:52 AM  Result Value Ref Range   Heparin Unfractionated 1.16 (H) 0.30 - 0.70 IU/mL    Comment: (NOTE) If heparin results are below expected values, and patient dosage has  been confirmed, suggest follow up testing of antithrombin III levels. Performed at [facility], [address] [place], [place] [address]   CBC     Status:  Abnormal   Collection Time: 07/18/20  5:52 AM  Result Value Ref Range   WBC 8.2 4.0 - 10.5 K/uL   RBC 3.92 (L) 4.22 - 5.81 MIL/uL   Hemoglobin 11.8 (L) 13.0 - 17.0 g/dL   HCT 34.5 (L) 39.0 - 52.0 %   MCV 88.0 80.0 - 100.0 fL   MCH 30.1 26.0 - 34.0 pg   MCHC 34.2 30.0 - 36.0 g/dL   RDW 12.0 11.5 - 15.5 %   Platelets 216 150 - 400 K/uL   nRBC 0.0 0.0 - 0.2 %    Comment: Performed at [facility], [address], [place], [place] [address]  Phosphorus     Status: None   Collection Time: 07/18/20  5:52 AM  Result Value Ref Range   Phosphorus 4.3 2.5 - 4.6 mg/dL    Comment: Performed at [facility], [address] [place], [place] [address]  Brain natriuretic peptide     Status: Abnormal   Collection Time: 07/18/20  5:52 AM  Result Value Ref Range   B Natriuretic Peptide 418.7 (H) 0.0 - 100.0 pg/mL    Comment: Performed at [facility], [address] [place], [place] [address]  Glucose, capillary     Status: Abnormal   Collection Time: 07/18/20  8:16 AM  Result Value Ref Range   Glucose-Capillary 141 (H) 70 - 99 mg/dL    Comment: Glucose reference range applies only to samples taken after fasting for at least 8 hours.  Glucose, capillary     Status: Abnormal   Collection Time: 07/18/20 12:15 PM  Result Value Ref Range   Glucose-Capillary 190 (H) 70 - 99 mg/dL    Comment: Glucose reference range applies only to samples taken after fasting for at least 8 hours.  Heparin level (unfractionated)     Status: Abnormal   Collection Time: 07/18/20  4:22 PM  Result Value Ref Range   Heparin Unfractionated 0.83 (H) 0.30 - 0.70 IU/mL    Comment: (NOTE) If heparin results are below expected values, and patient dosage has  been confirmed, suggest follow up testing of antithrombin III levels. Performed at [facility], [address] [place], [place] [address]   Glucose, capillary     Status: Abnormal   Collection Time: 07/18/20  5:07 PM  Result  Value Ref Range   Glucose-Capillary 254 (H) 70 - 99 mg/dL    Comment: Glucose reference range applies only to samples taken after fasting for at least 8 hours.  Glucose, capillary     Status: Abnormal   Collection Time: 07/18/20  9:44 PM  Result Value Ref Range   Glucose-Capillary 212 (H) 70 - 99 mg/dL    Comment: Glucose reference range applies only to samples taken after fasting for at least 8 hours.  Heparin level (unfractionated)     Status: Abnormal   Collection Time: 07/19/20  1:52 AM  Result Value Ref Range   Heparin Unfractionated 0.86 (H) 0.30 - 0.70 IU/mL    Comment: (NOTE) If heparin results are below expected values, and patient dosage has  been confirmed, suggest follow up testing of antithrombin III levels. Performed at [facility], [address] [place], [place] [address]   CBC     Status: Abnormal   Collection Time: 07/19/20  1:52 AM  Result Value Ref Range   WBC 6.6 4.0 - 10.5 K/uL   RBC 3.61 (L) 4.22 - 5.81 MIL/uL   Hemoglobin 10.9 (L) 13.0 - 17.0 g/dL   HCT 31.6 (L) 39.0 - 52.0 %   MCV 87.5 80.0 - 100.0 fL   MCH 30.2 26.0 - 34.0 pg   MCHC 34.5 30.0 - 36.0 g/dL   RDW 12.1 11.5 - 15.5 %   Platelets 219 150 - 400 K/uL   nRBC 0.0 0.0 - 0.2 %    Comment: Performed at [facility], [address] [place], [place] [id number]  Basic metabolic panel     Status: Abnormal   Collection Time: 07/19/20  1:52 AM  Result Value Ref Range   Sodium 139 135 - 145 mmol/L   Potassium 4.4 3.5 - 5.1 mmol/L   Chloride 105 98 - 111 mmol/L   CO2 23 22 - 32 mmol/L   Glucose, Bld 173 (H) 70 - 99 mg/dL    Comment: Glucose reference range applies only to samples taken after fasting for at least 8 hours.   BUN 62 (H) 6 - 20 mg/dL   Creatinine, Ser 3.64 (H) 0.61 - 1.24 mg/dL   Calcium 8.9 8.9 - 10.3 mg/dL   GFR, Estimated 19 (L) >60 mL/min    Comment: (NOTE) Calculated using the CKD-EPI Creatinine Equation (2021)    Anion gap 11 5 - 15    Comment: Performed  at [facility], [address] [place],  [address]  Glucose, capillary     Status: Abnormal   Collection Time: 07/19/20  7:23 AM  Result Value Ref Range   Glucose-Capillary 177 (H) 70 - 99 mg/dL    Comment: Glucose reference range applies only to samples taken after  fasting for at least 8 hours.  Heparin level (unfractionated)     Status: None   Collection Time: 07/19/20 11:38 AM  Result Value Ref Range   Heparin Unfractionated 0.65 0.30 - 0.70 IU/mL    Comment: (NOTE) If heparin results are below expected values, and patient dosage has  been confirmed, suggest follow up testing of antithrombin III levels. Performed at [facility], [address] [place], [place] [address]   Glucose, capillary     Status: Abnormal   Collection Time: 07/19/20 12:15 PM  Result Value Ref Range   Glucose-Capillary 302 (H) 70 - 99 mg/dL    Comment: Glucose reference range applies only to samples taken after fasting for at least 8 hours.  Glucose, capillary     Status: Abnormal   Collection Time: 07/19/20  3:46 PM  Result Value Ref Range   Glucose-Capillary 258 (H) 70 - 99 mg/dL    Comment: Glucose reference range applies only to samples taken after fasting for at least 8 hours.    Current Facility-Administered Medications  Medication Dose Route Frequency Provider Last Rate Last Admin  . 0.9 %  sodium chloride infusion   Intravenous Continuous Wellington Hampshire, MD 50 mL/hr at 07/19/20 1507 Infusion Verify at 07/19/20 1507  . 0.9 %  sodium chloride infusion  250 mL Intravenous PRN [name] A, MD      . acetaminophen (TYLENOL) tablet 650 mg  650 mg Oral Q6H PRN [name] A, MD   650 mg at 07/19/20 1415  . albuterol (VENTOLIN HFA) 108 (90 Base) MCG/ACT inhaler 2 puff  2 puff Inhalation Q4H PRN Wellington Hampshire, MD      . alum & mag hydroxide-simeth (MAALOX/MYLANTA) 200-200-20 MG/5ML suspension 30 mL  30 mL Oral Q4H PRN Wellington Hampshire, MD   30 mL at 07/18/20  1735  . aspirin EC tablet 81 mg  81 mg Oral Daily [name] A, MD   81 mg at 07/19/20 1414  . bisoprolol (ZEBETA) tablet 10 mg  10 mg Oral Daily Wellington Hampshire, MD   10 mg at 07/18/20 0855  . calcium carbonate (TUMS - dosed in mg elemental calcium) chewable tablet 200 mg of elemental calcium  1 tablet Oral TID PRN Wellington Hampshire, MD   200 mg of elemental calcium at 07/16/20 2342  . citalopram (CELEXA) tablet 20 mg  20 mg Oral Daily [name] A, MD   20 mg at 07/19/20 1415  . dextromethorphan-guaiFENesin (MUCINEX DM) 30-600 MG per 12 hr tablet 1 tablet  1 tablet Oral BID PRN [name] A, MD      . feeding supplement (GLUCERNA SHAKE) (GLUCERNA SHAKE) liquid 237 mL  237 mL Oral BID BM Arida, Muhammad A, MD   237 mL at 07/19/20 1419  . heparin injection 5,000 Units  5,000 Units Subcutaneous Q8H Arida, Muhammad A, MD      . hydrALAZINE (APRESOLINE) injection 5 mg  5 mg Intravenous Q2H PRN [name] A, MD   5 mg at 07/15/20 2225  . hydrALAZINE (APRESOLINE) tablet 100 mg  100 mg Oral BID [name] A, MD   100 mg at 07/19/20 1416  . insulin aspart (novoLOG) injection 0-5 Units  0-5 Units Subcutaneous QHS Wellington Hampshire, MD   2 Units at 07/18/20 2215  . insulin aspart (novoLOG) injection 0-9 Units  0-9 Units Subcutaneous TID WC Wellington Hampshire, MD   5 Units at 07/19/20 1714  . insulin detemir (  LEVEMIR) injection 20 Units  20 Units Subcutaneous Daily Fritzi Mandes, MD   20 Units at 07/19/20 1714  . isosorbide mononitrate (IMDUR) 24 hr tablet 120 mg  120 mg Oral Daily [name] A, MD   120 mg at 07/19/20 1415  . multivitamin with minerals tablet 1 tablet  1 tablet Oral Daily [name] A, MD   1 tablet at 07/19/20 1417  . nitroGLYCERIN (NITROSTAT) SL tablet 0.4 mg  0.4 mg Sublingual Q5 min PRN Wellington Hampshire, MD   0.4 mg at 07/15/20 1157  . ondansetron (ZOFRAN) injection 4 mg  4 mg Intravenous Q6H PRN Wellington Hampshire, MD   4 mg at 07/16/20 1222  .  ondansetron (ZOFRAN) tablet 4 mg  4 mg Oral Q6H PRN [name] A, MD      . pantoprazole (PROTONIX) EC tablet 40 mg  40 mg Oral Daily [name] A, MD   40 mg at 07/19/20 1417  . rosuvastatin (CRESTOR) tablet 40 mg  40 mg Oral Daily [name] A, MD   40 mg at 07/19/20 1417  . sodium chloride flush (NS) 0.9 % injection 3 mL  3 mL Intravenous Q12H [name] A, MD   3 mL at 07/19/20 1418  . sodium chloride flush (NS) 0.9 % injection 3 mL  3 mL Intravenous PRN Wellington Hampshire, MD        Musculoskeletal: Strength & Muscle Tone: within normal limits Gait & Station: normal Patient leans: N/A  Psychiatric Specialty Exam: Physical Exam Vitals and nursing note reviewed.  Constitutional:      Appearance: He is well-developed and well-nourished.  HENT:     Head: Normocephalic and atraumatic.  Eyes:     Conjunctiva/sclera: Conjunctivae normal.     Pupils: Pupils are equal, round, and reactive to light.  Cardiovascular:     Heart sounds: Normal heart sounds.  Pulmonary:     Effort: Pulmonary effort is normal.  Abdominal:     Palpations: Abdomen is soft.  Musculoskeletal:        General: Normal range of motion.     Cervical back: Normal range of motion.  Skin:    General: Skin is warm and dry.  Neurological:     General: No focal deficit present.     Mental Status: He is alert.  Psychiatric:        Attention and Perception: Attention normal.        Mood and Affect: Affect is blunt.        Speech: Speech is delayed.        Behavior: Behavior is slowed.        Thought Content: Thought content is not paranoid. Thought content does not include homicidal or suicidal ideation.        Cognition and Memory: Cognition normal.        Judgment: Judgment normal.     Review of Systems  Constitutional: Negative.   HENT: Negative.   Eyes: Negative.   Respiratory: Negative.   Cardiovascular: Negative.   Gastrointestinal: Negative.   Musculoskeletal: Negative.   Skin:  Negative.   Neurological: Negative.   Psychiatric/Behavioral: Positive for dysphoric mood. Negative for sleep disturbance and suicidal ideas.    Blood pressure (!) 147/71, pulse 72, temperature 98 F (36.7 C), temperature source Oral, resp. rate 20, height '5\' 5"'$  (1.651 m), weight 73.7 kg, SpO2 98 %.Body mass index is 27.04 kg/m.  General Appearance: Casual  Eye Contact:  Minimal  Speech:  Slow  Volume:  Decreased  Mood:  Euthymic  Affect:  Constricted  Thought Process:  Coherent  Orientation:  Full (Time, Place, and Person)  Thought Content:  Logical  Suicidal Thoughts:  No  Homicidal Thoughts:  No  Memory:  Immediate;   Fair Recent;   Fair Remote;   Fair  Judgement:  Fair  Insight:  Fair  Psychomotor Activity:  Decreased  Concentration:  Concentration: Fair  Recall:  [employer] of Knowledge:  Fair  Language:  Fair  Akathisia:  No  Handed:  Right  AIMS (if indicated):     Assets:  Desire for Improvement Housing Resilience  ADL's:  Intact  Cognition:  WNL  Sleep:        Treatment Plan Summary: Medication management and Plan Patient is minimizing symptoms continues to look depressed flat and withdrawn.  Continues to deny any suicidal thought and expresses a desire to get treatment for his heart condition.  I explained to him that I still strongly recommended continuing antidepressant medicine.  Explained how this can actually help with improving cardiac outcomes.  No change to dosage for now.  No indication at this point for sitter or commitment.  We will continue to follow-up in the hospital.  Disposition: No evidence of imminent risk to self or others at present.   Patient does not meet criteria for psychiatric inpatient admission. Supportive therapy provided about ongoing stressors.  Alethia Berthold, MD 07/19/2020 5:26 PM

## 2020-07-19 NOTE — Progress Notes (Signed)
ANTICOAGULATION CONSULT NOTE  Pharmacy Consult for Heparin  Indication: chest pain/ACS  No Known Allergies  Heparin Dosing Weight: 70.2 kg    Labs: Recent Labs    07/17/20 0615 07/17/20 1026 07/17/20 1445 07/18/20 0552 07/18/20 1622 07/19/20 0152 07/19/20 1138  HGB 12.1*  --   --  11.8*  --  10.9*  --   HCT 34.8*  --   --  34.5*  --  31.6*  --   PLT 248  --   --  216  --  219  --   HEPARINUNFRC 0.51  --    < > 1.16* 0.83* 0.86* 0.65  CREATININE 3.41*  --   --  3.25*  --  3.64*  --   TROPONINIHS  --  133*  --   --   --   --   --    < > = values in this interval not displayed.    Estimated Creatinine Clearance: 19.5 mL/min (A) (by C-G formula based on SCr of 3.64 mg/dL (H)).   Medical History: Past Medical History:  Diagnosis Date  . Cardiomyopathy ([place])    a. 07/2009 MV: EF 48%; b. 08/2019 Echo: EF 45-50%. Global HK. Mod LVH. Gr1 DD. Nl RV fxn. Nl PASP.  Marland Kitchen Chest pain    a. 07/2009 MV: EF 48%. No ischemia.  . Diabetes mellitus without complication ([place])   . HFmrEF (heart failure with mildy reduced ejection fraction) ([place])   . Hypertension     Assessment: 60yo male with past medical history significant ofsCHF withEF of 45 to 50%, hypertension, hyperlipidemia, diabetes mellitus, COVID-19 infection 4 months ago, CKD 3B, who presented with shortness of breath. He was found to have an elevated BNP at 834.1, troponins elevated at 124>127>200>133. Pharmacy consulted to dose heparin in this 60 year old male admitted with ACS/NSTEMI.  No prior anticoag noted.   Baseline labs: Plt 223, Hgb 11.3, Hct 34.6 H&H 10.9/31.6 Plt remain stable  1/28 2112  HL 0.19, subtherapeutic 1/29 0615 HL 0.51, therapeutic x1 1/29 1445 HL 0.37, therapeutic x2 1/30 0552 HL 1.16, supratherapeutic - held for 1 hr, rate decreased to 900 units/hr 1/30 1622 HL 0.83, supratherapeutic --  1/31 0152 HL 0.86, supratherapeutic 1/31 1138 HL 0.65, therapeutic  Goal of Therapy:  Heparin level 0.3-0.7  units/ml Monitor platelets by anticoagulation protocol: Yes   Plan:   HL 0.65 is in the therapeutic range. No change in dose needed, continue 600 units/hr Recheck HL '@2000'$  (approx. 8 hours after prior level). Recheck CBC in AM.  Trace Cederberg Dory Larsen ([employer] student) 07/19/2020 12:57 PM

## 2020-07-19 NOTE — Progress Notes (Signed)
[facility], [name] 07/19/20  Subjective:   Hospital day # 4   cvs: pulm: gi: Renal: 01/30 0701 - 01/31 0700 In: 519.4 [P.O.:360; I.V.:159.4] Out: 2050 [Urine:2050] Lab Results  Component Value Date   CREATININE 3.64 (H) 07/19/2020   CREATININE 3.25 (H) 07/18/2020   CREATININE 3.41 (H) 07/17/2020     Objective:  Vital signs in last 24 hours:  Temp:  [98 F (36.7 C)-98.5 F (36.9 C)] 98 F (36.7 C) (01/31 1545) Pulse Rate:  [70-90] 72 (01/31 1545) Resp:  [13-23] 20 (01/31 1545) BP: (127-170)/(62-89) 147/71 (01/31 1545) SpO2:  [95 %-100 %] 98 % (01/31 1545) Weight:  [73.7 kg] 73.7 kg (01/31 0300)  Weight change: 2.087 kg Filed Weights   07/17/20 0509 07/18/20 0403 07/19/20 0300  Weight: 71.4 kg 71.6 kg 73.7 kg    Intake/Output:    Intake/Output Summary (Last 24 hours) at 07/19/2020 1701 Last data filed at 07/19/2020 1507 Gross per 24 hour  Intake 362.57 ml  Output 1250 ml  Net -887.43 ml     Physical Exam: General:   HEENT   Pulm/lungs   CVS/Heart   Abdomen:    Extremities:   Neurologic:   Skin:   Access:        Basic Metabolic Panel:  Recent Labs  Lab 07/15/20 0359 07/16/20 0532 07/17/20 0615 07/18/20 0552 07/19/20 0152  NA 141 144 143 140 139  K 3.7 3.4* 4.0 4.0 4.4  CL 108 106 106 105 105  CO2 '25 29 29 25 23  '$ GLUCOSE 245* 145* 94 160* 173*  BUN 42* 46* 55* 55* 62*  CREATININE 3.00* 2.96* 3.41* 3.25* 3.64*  CALCIUM 8.5* 8.8* 9.4 8.9 8.9  MG  --  1.9  --   --   --   PHOS  --   --   --  4.3  --      CBC: Recent Labs  Lab 07/15/20 0359 07/16/20 0532 07/17/20 0615 07/18/20 0552 07/19/20 0152  WBC 6.0 7.8 7.9 8.2 6.6  NEUTROABS 3.7  --   --   --   --   HGB 11.9* 11.7* 12.1* 11.8* 10.9*  HCT 34.6* 33.7* 34.8* 34.5* 31.6*  MCV 86.9 86.2 86.8 88.0 87.5  PLT 223 232 248 216 219     No results found for: HEPBSAG, HEPBSAB, HEPBIGM    Microbiology:  Recent Results (from the past 240 hour(s))   SARS Coronavirus 2 by RT PCR (hospital order, performed in [facility] hospital lab) Nasopharyngeal Nasopharyngeal Swab     Status: None   Collection Time: 07/15/20  3:59 AM   Specimen: Nasopharyngeal Swab  Result Value Ref Range Status   SARS Coronavirus 2 NEGATIVE NEGATIVE Final    Comment: (NOTE) SARS-CoV-2 target nucleic acids are NOT DETECTED.  The SARS-CoV-2 RNA is generally detectable in upper and lower respiratory specimens during the acute phase of infection. The lowest concentration of SARS-CoV-2 viral copies this assay can detect is 250 copies / mL. A negative result does not preclude SARS-CoV-2 infection and should not be used as the sole basis for treatment or other patient management decisions.  A negative result may occur with improper specimen collection / handling, submission of specimen other than nasopharyngeal swab, presence of viral mutation(s) within the areas targeted by this assay, and inadequate number of viral copies (<250 copies / mL). A negative result must be combined with clinical observations, patient history, and epidemiological information.  Fact Sheet for Patients:   StrictlyIdeas.no  Fact Sheet for Healthcare Providers: BankingDealers.co.za  This test is not yet approved or  cleared by the [place] FDA and has been authorized for detection and/or diagnosis of SARS-CoV-2 by FDA under an Emergency Use Authorization (EUA).  This EUA will remain in effect (meaning this test can be used) for the duration of the COVID-19 declaration under Section 564(b)(1) of the Act, 21 U.S.C. section 360bbb-3(b)(1), unless the authorization is terminated or revoked sooner.  Performed at [facility], [address] [place], [place] [address]   Culture, blood (single)     Status: None (Preliminary result)   Collection Time: 07/15/20  3:59 AM   Specimen: BLOOD  Result Value Ref Range Status   Specimen  Description BLOOD RIGHT FA  Final   Special Requests   Final    BOTTLES DRAWN AEROBIC AND ANAEROBIC Blood Culture results may not be optimal due to an excessive volume of blood received in culture bottles   Culture   Final    NO GROWTH 4 DAYS Performed at [facility], [address] [place], [place] [address]    Report Status PENDING  Incomplete    Coagulation Studies: No results for input(s): LABPROT, INR in the last 72 hours.  Urinalysis: No results for input(s): COLORURINE, LABSPEC, PHURINE, GLUCOSEU, HGBUR, BILIRUBINUR, KETONESUR, PROTEINUR, UROBILINOGEN, NITRITE, LEUKOCYTESUR in the last 72 hours.  Invalid input(s): APPERANCEUR    Imaging: DG Chest 1 View  Result Date: 07/18/2020 CLINICAL DATA:  In patient follow-up of acute tubular necrosis. EXAM: CHEST  1 VIEW COMPARISON:  Yesterday FINDINGS: Borderline heart size. Unremarkable aortic and hilar contours. There is no edema, consolidation, effusion, or pneumothorax. IMPRESSION: Low volume but clear lungs. Electronically Signed   By: Monte Fantasia M.D.   On: 07/18/2020 11:35   CARDIAC CATHETERIZATION  Result Date: 07/19/2020  Prox RCA lesion is 20% stenosed.  Mid RCA lesion is 30% stenosed.  Dist RCA lesion is 50% stenosed with 50% stenosed side branch in RPAV.  Prox LAD lesion is 40% stenosed.  1st Diag lesion is 70% stenosed.  Mid LAD lesion is 60% stenosed.  1.  Moderately severely calcified coronary arteries with moderate two-vessel coronary artery disease involving the LAD and right coronary artery.  No evidence of critical stenosis. 2.  Right heart catheterization showed mildly elevated filling pressures, mild pulmonary hypertension and normal cardiac output. 3.  Left ventricular angiography was not performed due to chronic kidney disease.  EF was moderately reduced by echo. Recommendations: Recommend aggressive medical therapy for nonobstructive coronary artery disease.  Cardiomyopathy seems to be out of  proportion to coronary artery disease. Monitor for contrast-induced nephropathy.  Only 40 mL of contrast was used.  We will do gentle hydration for 6 hours.  I suspect that he will require an oral diuretic by tomorrow.     Medications:   . sodium chloride 50 mL/hr at 07/19/20 1507  . sodium chloride     . aspirin EC  81 mg Oral Daily  . bisoprolol  10 mg Oral Daily  . citalopram  20 mg Oral Daily  . feeding supplement (GLUCERNA SHAKE)  237 mL Oral BID BM  . heparin  5,000 Units Subcutaneous Q8H  . hydrALAZINE  100 mg Oral BID  . insulin aspart  0-5 Units Subcutaneous QHS  . insulin aspart  0-9 Units Subcutaneous TID WC  . insulin detemir  20 Units Subcutaneous Daily  . isosorbide mononitrate  120 mg Oral Daily  . multivitamin with minerals  1 tablet Oral Daily  . pantoprazole  40 mg Oral Daily  . rosuvastatin  40 mg Oral Daily  . sodium chloride flush  3 mL Intravenous Q12H   sodium chloride, acetaminophen, albuterol, alum & mag hydroxide-simeth, calcium carbonate, dextromethorphan-guaiFENesin, hydrALAZINE, nitroGLYCERIN, ondansetron (ZOFRAN) IV, ondansetron, sodium chloride flush  Assessment/ Plan:  60 y.o. male with diabetes, hypertension, CHF  admitted on 07/15/2020 for SOB (shortness of breath) [R06.02] Elevated troponin [R77.8] Acute CHF (congestive heart failure) (HCC) [I50.9] Acute on chronic systolic congestive heart failure (HCC) [I50.23] Acute on chronic systolic CHF (congestive heart failure) ([place]) [I50.23]  07/16/2020: 2D echo: LVEF 30 to 35%, global hypokinesis, moderately dilated left ventricular internal cavity, grade 2 diastolic dysfunction  #Acute kidney injury Likely secondary to ATN/Cardiorenal syndrome 01/30 0701 - 01/31 0700 In: 519.4 [P.O.:360; I.V.:159.4] Out: 2050 [Urine:2050] Lab Results  Component Value Date   CREATININE 3.64 (H) 07/19/2020   CREATININE 3.25 (H) 07/18/2020   CREATININE 3.41 (H) 07/17/2020  Currently being treated with IV fluids to  correct for volume depletion  #Chronic kidney disease stage IV Baseline creatinine 2.96/GFR 24 from July 16, 2020 CKD likely secondary to diabetes, hypertension and atherosclerosis  #Non-STEMI Status post cardiac cath which shows moderate to severe calcified coronary arteries and two-vessel coronary disease involving LAD and RCA. Medical management recommended. 40 mL IV contrast used  #Type 2 diabetes with CKD Lab Results  Component Value Date   HGBA1C 9.7 (H) 07/15/2020  Obtain urine microalbumin creatinine ratio Statin-Crestor Was on Zestril at home 30 mg daily. We will be able to restart once creatinine stabilizes.    LOS: 4 Hannahmarie Asberry 1/31/20225:01 PM  [facility] [place], [place]  Note: This note was prepared with Dragon dictation. Any transcription errors are unintentional

## 2020-07-19 NOTE — Progress Notes (Signed)
Progress Note  Patient Name: Jimmy Olson Date of Encounter: 07/19/2020  [facility] Cardiologist: Ida Rogue, MD   Subjective   I performed a right and left cardiac catheterization this morning which showed mildly elevated filling pressures, mild pulmonary hypertension and normal cardiac output.  Coronary angiography showed moderate two-vessel coronary artery disease.  Medical therapy recommended.  Inpatient Medications    Scheduled Meds: . aspirin EC  81 mg Oral Daily  . bisoprolol  10 mg Oral Daily  . citalopram  20 mg Oral Daily  . feeding supplement (GLUCERNA SHAKE)  237 mL Oral BID BM  . heparin  5,000 Units Subcutaneous Q8H  . hydrALAZINE  100 mg Oral BID  . insulin aspart  0-5 Units Subcutaneous QHS  . insulin aspart  0-9 Units Subcutaneous TID WC  . insulin detemir  10 Units Subcutaneous Daily  . isosorbide mononitrate  120 mg Oral Daily  . multivitamin with minerals  1 tablet Oral Daily  . pantoprazole  40 mg Oral Daily  . rosuvastatin  40 mg Oral Daily  . sodium chloride flush  3 mL Intravenous Q12H   Continuous Infusions: . sodium chloride    . sodium chloride     PRN Meds: sodium chloride, acetaminophen, albuterol, alum & mag hydroxide-simeth, calcium carbonate, dextromethorphan-guaiFENesin, hydrALAZINE, nitroGLYCERIN, ondansetron (ZOFRAN) IV, ondansetron, sodium chloride flush   Vital Signs    Vitals:   07/19/20 1000 07/19/20 1015 07/19/20 1045 07/19/20 1214  BP: (!) 148/73 127/62  (!) 149/71  Pulse: 78 72  71  Resp: '17 13 14 18  '$ Temp:    98.5 F (36.9 C)  TempSrc:    Oral  SpO2: 100% 99%  98%  Weight:      Height:        Intake/Output Summary (Last 24 hours) at 07/19/2020 1337 Last data filed at 07/19/2020 1125 Gross per 24 hour  Intake 279.42 ml  Output 1700 ml  Net -1420.58 ml   Last 3 Weights 07/19/2020 07/18/2020 07/17/2020  Weight (lbs) 162 lb 8 oz 157 lb 14.4 oz 157 lb 4.8 oz  Weight (kg) 73.71 kg 71.623 kg 71.351 kg       Telemetry    Normal sinus rhythm- Personally Reviewed  ECG    Not done today- Personally Reviewed  Physical Exam   GEN: No acute distress.   Neck: No JVD Cardiac: RRR, no murmurs, rubs, or gallops.  Respiratory: Clear to auscultation bilaterally. GI: Soft, nontender, non-distended  MS: No edema; No deformity. Neuro:  Nonfocal  Psych: Normal affect   Labs    High Sensitivity Troponin:   Recent Labs  Lab 07/15/20 0535 07/15/20 1124 07/15/20 1943 07/15/20 2240 07/17/20 1026  TROPONINIHS 127* 176* 190* 200* 133*      Chemistry Recent Labs  Lab 07/15/20 0359 07/16/20 0532 07/17/20 0615 07/18/20 0552 07/19/20 0152  NA 141   < > 143 140 139  K 3.7   < > 4.0 4.0 4.4  CL 108   < > 106 105 105  CO2 25   < > '29 25 23  '$ GLUCOSE 245*   < > 94 160* 173*  BUN 42*   < > 55* 55* 62*  CREATININE 3.00*   < > 3.41* 3.25* 3.64*  CALCIUM 8.5*   < > 9.4 8.9 8.9  PROT 6.3*  --   --   --   --   ALBUMIN 3.0*  --   --   --   --  AST 18  --   --   --   --   ALT 15  --   --   --   --   ALKPHOS 81  --   --   --   --   BILITOT 0.5  --   --   --   --   GFRNONAA 23*   < > 20* 21* 19*  ANIONGAP 8   < > '8 10 11   '$ < > = values in this interval not displayed.     Hematology Recent Labs  Lab 07/17/20 0615 07/17/20 1930 07/18/20 0552 07/19/20 0152  WBC 7.9  --  8.2 6.6  RBC 4.01* 3.39* 3.92* 3.61*  HGB 12.1*  --  11.8* 10.9*  HCT 34.8*  --  34.5* 31.6*  MCV 86.8  --  88.0 87.5  MCH 30.2  --  30.1 30.2  MCHC 34.8  --  34.2 34.5  RDW 12.0  --  12.0 12.1  PLT 248  --  216 219    BNP Recent Labs  Lab 07/15/20 0359 07/18/20 0552  BNP 834.1* 418.7*     DDimer No results for input(s): DDIMER in the last 168 hours.   Radiology    DG Chest 1 View  Result Date: 07/18/2020 CLINICAL DATA:  In patient follow-up of acute tubular necrosis. EXAM: CHEST  1 VIEW COMPARISON:  Yesterday FINDINGS: Borderline heart size. Unremarkable aortic and hilar contours. There is no edema,  consolidation, effusion, or pneumothorax. IMPRESSION: Low volume but clear lungs. Electronically Signed   By: Monte Fantasia M.D.   On: 07/18/2020 11:35   CARDIAC CATHETERIZATION  Result Date: 07/19/2020  Prox RCA lesion is 20% stenosed.  Mid RCA lesion is 30% stenosed.  Dist RCA lesion is 50% stenosed with 50% stenosed side branch in RPAV.  Prox LAD lesion is 40% stenosed.  1st Diag lesion is 70% stenosed.  Mid LAD lesion is 60% stenosed.  1.  Moderately severely calcified coronary arteries with moderate two-vessel coronary artery disease involving the LAD and right coronary artery.  No evidence of critical stenosis. 2.  Right heart catheterization showed mildly elevated filling pressures, mild pulmonary hypertension and normal cardiac output. 3.  Left ventricular angiography was not performed due to chronic kidney disease.  EF was moderately reduced by echo. Recommendations: Recommend aggressive medical therapy for nonobstructive coronary artery disease.  Cardiomyopathy seems to be out of proportion to coronary artery disease. Monitor for contrast-induced nephropathy.  Only 40 mL of contrast was used.  We will do gentle hydration for 6 hours.  I suspect that he will require an oral diuretic by tomorrow.   US RENAL  Result Date: 07/17/2020 CLINICAL DATA:  Acute tubular necrosis, history diabetes mellitus, hypertension, cardiomyopathy EXAM: RENAL / URINARY TRACT ULTRASOUND COMPLETE COMPARISON:  CT abdomen and pelvis 02/01/2007 FINDINGS: Right Kidney: Renal measurements: 11.1 x 5.5 x 5.2 cm = volume: 167 mL. Normal cortical thickness. Increased cortical echogenicity. No mass, hydronephrosis, or shadowing calcification. Left Kidney: Renal measurements: 10.5 x 5.8 x 7.3 cm = volume: 234 mL. Normal cortical thickness. Increased cortical echogenicity. No mass, hydronephrosis, or shadowing calcification. Bladder: Appears normal for degree of bladder distention. Other: Prostate gland enlarged 5.1 x 4.2 x 5.3  cm (volume = 59 cm^3) IMPRESSION: Medical renal disease changes of both kidneys. No evidence of renal mass or hydronephrosis. Prostatic enlargement. Electronically Signed   By: Lavonia Dana M.D.   On: 07/17/2020 17:02    Cardiac Studies  Echocardiogram on January 28 of 2022:  1. Left ventricular ejection fraction, by estimation, is 30 to 35%. The  left ventricle has moderately decreased function. The left ventricle  demonstrates global hypokinesis. The left ventricular internal cavity size  was moderately dilated. There is mild  left ventricular hypertrophy. Left ventricular diastolic parameters are  consistent with Grade II diastolic dysfunction (pseudonormalization). The  average left ventricular global longitudinal strain is -10.1 %. The global  longitudinal strain is abnormal.  2. Right ventricular systolic function is normal. The right ventricular  size is normal. Tricuspid regurgitation signal is inadequate for assessing  PA pressure.  3. Left atrial size was mildly dilated.  4. The inferior vena cava is normal in size with greater than 50%  respiratory variability, suggesting right atrial pressure of 3 mmHg.  Right and left cardiac catheterization done today by me:  1.  Moderately severely calcified coronary arteries with moderate two-vessel coronary artery disease involving the LAD and right coronary artery.  No evidence of critical stenosis. 2.  Right heart catheterization showed mildly elevated filling pressures, mild pulmonary hypertension and normal cardiac output. 3.  Left ventricular angiography was not performed due to chronic kidney disease.  EF was moderately reduced by echo.  Recommendations: Recommend aggressive medical therapy for nonobstructive coronary artery disease.  Cardiomyopathy seems to be out of proportion to coronary artery disease. Monitor for contrast-induced nephropathy.  Only 40 mL of contrast was used.  We will do gentle hydration for 6 hours.  I  suspect that he will require an oral diuretic by tomorrow.   Patient Profile     60 y.o. male with history of chronic systolic heart failure, history of Covid infection in September, poorly controlled hypertension, hyperlipidemia, type 2 diabetes and chronic kidney disease who presented with acute on chronic systolic heart failure and elevated troponin  Assessment & Plan    1.  Elevated troponin: Likely supply demand ischemia in the setting of heart failure and uncontrolled hypertension.  Cardiac catheterization today showed no obstructive disease.  Continue medical therapy.  2.  Acute on chronic systolic heart failure: Right heart catheterization showed mildly elevated filling pressures with a pulmonary capillary wedge pressure of 16 mmHg.  Given contrast exposure, I elected to gently hydrate him for few hours after cath.  I suspect that he will require an oral diuretic tomorrow. Continue treatment with bisoprolol as well as a combination of hydralazine and long-acting nitroglycerin.  No ACE inhibitor or ARB due to advanced chronic kidney disease.  3.  Essential hypertension: Continue up titrating medications to control blood pressure.  3.  Acute on chronic kidney disease: Monitor renal function closely.  Only 40 mL of contrast was used.       For questions or updates, please contact [place] Please consult [email or web address] for contact info under        Signed, [name], MD  07/19/2020, 1:37 PM

## 2020-07-20 ENCOUNTER — Inpatient Hospital Stay: Payer: BC Managed Care – PPO

## 2020-07-20 DIAGNOSIS — I5023 Acute on chronic systolic (congestive) heart failure: Secondary | ICD-10-CM | POA: Diagnosis not present

## 2020-07-20 DIAGNOSIS — I248 Other forms of acute ischemic heart disease: Secondary | ICD-10-CM

## 2020-07-20 LAB — CULTURE, BLOOD (SINGLE): Culture: NO GROWTH

## 2020-07-20 LAB — BASIC METABOLIC PANEL
Anion gap: 8 (ref 5–15)
BUN: 68 mg/dL — ABNORMAL HIGH (ref 6–20)
CO2: 26 mmol/L (ref 22–32)
Calcium: 8.5 mg/dL — ABNORMAL LOW (ref 8.9–10.3)
Chloride: 106 mmol/L (ref 98–111)
Creatinine, Ser: 3.85 mg/dL — ABNORMAL HIGH (ref 0.61–1.24)
GFR, Estimated: 17 mL/min — ABNORMAL LOW (ref >60)
Glucose, Bld: 126 mg/dL — ABNORMAL HIGH (ref 70–99)
Potassium: 4.7 mmol/L (ref 3.5–5.1)
Sodium: 140 mmol/L (ref 135–145)

## 2020-07-20 LAB — CBC
HCT: 29.1 % — ABNORMAL LOW (ref 39.0–52.0)
Hemoglobin: 9.5 g/dL — ABNORMAL LOW (ref 13.0–17.0)
MCH: 29.2 pg (ref 26.0–34.0)
MCHC: 32.6 g/dL (ref 30.0–36.0)
MCV: 89.5 fL (ref 80.0–100.0)
Platelets: 200 10*3/uL (ref 150–400)
RBC: 3.25 MIL/uL — ABNORMAL LOW (ref 4.22–5.81)
RDW: 12 % (ref 11.5–15.5)
WBC: 5.6 10*3/uL (ref 4.0–10.5)
nRBC: 0 % (ref 0.0–0.2)

## 2020-07-20 LAB — GLUCOSE, CAPILLARY
Glucose-Capillary: 114 mg/dL — ABNORMAL HIGH (ref 70–99)
Glucose-Capillary: 201 mg/dL — ABNORMAL HIGH (ref 70–99)
Glucose-Capillary: 212 mg/dL — ABNORMAL HIGH (ref 70–99)
Glucose-Capillary: 152 mg/dL — ABNORMAL HIGH (ref 70–99)
Glucose-Capillary: 227 mg/dL — ABNORMAL HIGH (ref 70–99)

## 2020-07-20 LAB — BRAIN NATRIURETIC PEPTIDE: B Natriuretic Peptide: 584.1 pg/mL — ABNORMAL HIGH (ref 0.0–100.0)

## 2020-07-20 LAB — TROPONIN I (HIGH SENSITIVITY): Troponin I (High Sensitivity): 94 ng/L — ABNORMAL HIGH (ref <18)

## 2020-07-20 MED ORDER — INSULIN DETEMIR 100 UNIT/ML ~~LOC~~ SOLN
25.0000 [IU] | Freq: Every day | SUBCUTANEOUS | Status: DC
  Administered 2020-07-21 – 2020-07-25 (×4): 25 [IU] via SUBCUTANEOUS
  Filled 2020-07-20 (×6): qty 0.25

## 2020-07-20 MED ORDER — FUROSEMIDE 10 MG/ML IJ SOLN
40.0000 mg | Freq: Once | INTRAMUSCULAR | Status: AC
  Administered 2020-07-20: 40 mg via INTRAVENOUS
  Filled 2020-07-20: qty 4

## 2020-07-20 MED ORDER — ATORVASTATIN CALCIUM 80 MG PO TABS
80.0000 mg | ORAL_TABLET | Freq: Every day | ORAL | Status: DC
  Administered 2020-07-20 – 2020-07-25 (×6): 80 mg via ORAL
  Filled 2020-07-20 (×6): qty 1

## 2020-07-20 NOTE — Progress Notes (Signed)
[facility] [place] NAME: Jimmy Olson    MR#:  [id number]  DATE OF BIRTH:  [birth year]  SUBJECTIVE:   Patient seen via [place] video interpreter.  Patient complains of chest pain/chest congestion and shortness of breath since last night. Received nitroglycerin. Currently on 2 L nasal cannula oxygen sats are 98 200%. Sugars trending up. Patient eating reasonably well. REVIEW OF SYSTEMS:   Review of Systems  Constitutional: Negative for chills, fever and weight loss.  HENT: Negative for ear discharge, ear pain and nosebleeds.   Eyes: Negative for blurred vision, pain and discharge.  Respiratory: Positive for shortness of breath. Negative for sputum production, wheezing and stridor.   Cardiovascular: Positive for chest pain. Negative for palpitations, orthopnea and PND.  Gastrointestinal: Negative for diarrhea, nausea and vomiting.  Genitourinary: Negative for frequency and urgency.  Musculoskeletal: Negative for back pain and joint pain.  Neurological: Negative for sensory change, speech change, focal weakness and weakness.  Psychiatric/Behavioral: Negative for depression and hallucinations. The patient is not nervous/anxious.    Tolerating Diet:yesTolerating PT:   DRUG ALLERGIES:  No Known Allergies  VITALS:  Blood pressure (!) 141/73, pulse 73, temperature 98.5 F (36.9 C), temperature source Oral, resp. rate 18, height '5\' 5"'$  (1.651 m), weight 73.7 kg, SpO2 98 %.  PHYSICAL EXAMINATION:   Physical Exam  General exam: Appears calm and comfortable  Respiratory system: basilar crackles and on 2 lit of [place] oxygen, rhonchi wheezing no respiratory distress noted. Cardiovascular system: S1 & S2 heard, RRR. No JVD, . No pedal edema. Gastrointestinal system: Abdomen is nondistended, soft and nontender.. Normal bowel sounds heard. Central nervous system: Alert and oriented. No focal neurological deficits. Extremities: Symmetric 5 x 5  power. Skin: No rashes, lesions or ulcers Psychiatry:  Mood & affect appropriate.  LABORATORY PANEL:  CBC Recent Labs  Lab 07/20/20 0443  WBC 5.6  HGB 9.5*  HCT 29.1*  PLT 200    Chemistries  Recent Labs  Lab 07/15/20 0359 07/16/20 0532 07/17/20 0615 07/20/20 0443  NA 141 144   < > 140  K 3.7 3.4*   < > 4.7  CL 108 106   < > 106  CO2 25 29   < > 26  GLUCOSE 245* 145*   < > 126*  BUN 42* 46*   < > 68*  CREATININE 3.00* 2.96*   < > 3.85*  CALCIUM 8.5* 8.8*   < > 8.5*  MG  --  1.9  --   --   AST 18  --   --   --   ALT 15  --   --   --   ALKPHOS 81  --   --   --   BILITOT 0.5  --   --   --    < > = values in this interval not displayed.   Cardiac Enzymes No results for input(s): TROPONINI in the last 168 hours. RADIOLOGY:  CARDIAC CATHETERIZATION  Result Date: 07/19/2020  Prox RCA lesion is 20% stenosed.  Mid RCA lesion is 30% stenosed.  Dist RCA lesion is 50% stenosed with 50% stenosed side branch in RPAV.  Prox LAD lesion is 40% stenosed.  1st Diag lesion is 70% stenosed.  Mid LAD lesion is 60% stenosed.  1.  Moderately severely calcified coronary arteries with moderate two-vessel coronary artery disease involving the LAD and right coronary artery.  No evidence of critical stenosis. 2.  Right heart catheterization  showed mildly elevated filling pressures, mild pulmonary hypertension and normal cardiac output. 3.  Left ventricular angiography was not performed due to chronic kidney disease.  EF was moderately reduced by echo. Recommendations: Recommend aggressive medical therapy for nonobstructive coronary artery disease.  Cardiomyopathy seems to be out of proportion to coronary artery disease. Monitor for contrast-induced nephropathy.  Only 40 mL of contrast was used.  We will do gentle hydration for 6 hours.  I suspect that he will require an oral diuretic by tomorrow.   DG Chest Port 1 View  Result Date: 07/20/2020 CLINICAL DATA:  Chest pain. EXAM: PORTABLE CHEST 1  VIEW COMPARISON:  07/18/2020. FINDINGS: Borderline cardiomegaly again noted. No pulmonary venous congestion. Low lung volumes again noted. No focal infiltrate. No pleural effusion or pneumothorax. Degenerative change thoracic spine. IMPRESSION: 1. Borderline cardiomegaly again noted. No pulmonary venous congestion. 2. No acute pulmonary disease. Electronically Signed   By: Marcello Moores  Register   On: 07/20/2020 06:26   ASSESSMENT AND PLAN:  Jimmy Olson a 60 y.o.malewith medical history significant ofsCHF withEF of 45 to 50%, hypertension, hyperlipidemia, diabetes mellitus, COVID-19 infection 4 months ago, CKD 3B, who presents with shortness of breath.  Patient reports having shortness of breath for more than 2 days associated with intermittent chest pain and chest tightness.  Chest x-ray shows interstitial pulmonary edema with possible small right pleural effusion.  Patient admitted for acute on chronic systolic and diastolic heart failure.  Acute respiratory Failure with hypoxia Acute on chronic systolic and diastolic heart failure.  -- requiring up to 2 lit of [place] oxygen to keep sats greater than 90%.  --on IV lasix 40 mg BID--now d/ced --Cardiology [facility] consulted-- Recommends ischemic workup with left heart catheterization plan for Monday 1/31 -- strict intake and output. Daily weights.  --2 d echo shows worsening of the left ventricular EF form 40 to 45% down to 30 to 35%.  With grade 2 diastolic dysfunction. --2/1-- chest x-ray stable. BNP elevated 581. Patient had shortness of breath and chest pressure last night. Will give one dose of Lasix 40 mg times one. Discussed with nephrology.  NSTEMI --Intermittent chest pain and elevated troponins.  -- Patient was on IV heparin -- continue with aspirin and imdur.  --EKG shows t wave inversions in the lateral leads and minimal ST elevation --cath showed Moderately severely calcified coronary arteries with moderate two-vessel coronary artery  disease involving the LAD and right coronary artery.  No evidence of critical stenosis. 2.  Right heart catheterization showed mildly elevated filling pressures, mild pulmonary hypertension and normal cardiac output. 3.  Left ventricular angiography was not performed due to chronic kidney disease.  EF was moderately reduced by echo. -- Cardiology recommends medical management  Type 2 DM:  A1C IS 9.7% sugars rising. Will resume Levemir 25 units along with sliding scale  Hypokalemia:  Replace. K   AKI: superimposed on CKD stage IIIB -Baseline creatinine appears to be between 1.8 to 2.  -Admitted with a creatinine of 3 and creatinine REMAINS AT 3.41  -nephrology consultation with Dr. Candiss Norse  -- came in with creatinine of 3.41--- 3.25 -- 3.64 -- cardiac cath-- 3.69 tomorrow -- avoid nephrotoxins -- urine output decent  hyperlipidemia:  LDL is 110. - Increase crestor to 40 mg daily.   Hypertension:  Bp ok   DVT prophylaxis: heparin Code Status: (Fullcode ) Family Communication: none at bedside.  Disposition: home  Status is: Inpatient  Remains inpatient appropriate because:Ongoing diagnostic w/u for NSTEMI, Acute  on CKD-IIIB and chf patient is status post cardiac cath need to monitor creatinine. Receiving IV fluids per cardiology recommendation.  Dispo: The patient is from: Home  Anticipated d/c is to: Home  Anticipated d/c date is: 1-2 days   Patient currently is not medically stable to d/c.              Difficult to place patient No  Level of care: Progressive Cardiac  Monitoring creatinine post cath Nephrology consulted      TOTAL TIME TAKING CARE OF THIS PATIENT: 25* minutes.  >50% time spent on counselling and coordination of care  Note: This dictation was prepared with Dragon dictation along with smaller phrase technology. Any transcriptional errors that result from this process are unintentional.  Fritzi Mandes M.D     [facility]   CC: Primary care physician; [facility]: [facility], male   DOB: [birth year], 60 y.o.   MRN: [id number]

## 2020-07-20 NOTE — Progress Notes (Signed)
[place], [name] 07/20/20  Subjective:   Hospital day # 5  States he has left chest tightness Currently on room air No distress in breathing Able to void without any problems Serum creatinine trend is worsening Renal: 01/31 0701 - 02/01 0700 In: 323.2 [P.O.:240; I.V.:83.2] Out: 600 [Urine:600] Lab Results  Component Value Date   CREATININE 3.85 (H) 07/20/2020   CREATININE 3.64 (H) 07/19/2020   CREATININE 3.25 (H) 07/18/2020     Objective:  Vital signs in last 24 hours:  Temp:  [98 F (36.7 C)-98.6 F (37 C)] 98.4 F (36.9 C) (02/01 1130) Pulse Rate:  [70-78] 70 (02/01 1416) Resp:  [16-20] 18 (02/01 1416) BP: (114-147)/(57-73) 130/66 (02/01 1416) SpO2:  [98 %-100 %] 98 % (02/01 1416)  Weight change:  Filed Weights   07/17/20 0509 07/18/20 0403 07/19/20 0300  Weight: 71.4 kg 71.6 kg 73.7 kg    Intake/Output:    Intake/Output Summary (Last 24 hours) at 07/20/2020 1529 Last data filed at 07/20/2020 1504 Gross per 24 hour  Intake 780 ml  Output 850 ml  Net -70 ml    Physical Exam: General:  No acute distress, laying in the bed  HEENT  anicteric, moist oral mucous membrane  Pulm/lungs  normal breathing effort, lungs are clear to auscultation  CVS/Heart  regular rhythm, no rub or gallop  Abdomen:   Soft, nontender  Extremities:  No peripheral edema  Neurologic:  Alert, oriented, able to follow commands  Skin:  No acute rashes     Basic Metabolic Panel:  Recent Labs  Lab 07/16/20 0532 07/17/20 0615 07/18/20 0552 07/19/20 0152 07/20/20 0443  NA 144 143 140 139 140  K 3.4* 4.0 4.0 4.4 4.7  CL 106 106 105 105 106  CO2 '29 29 25 23 26  '$ GLUCOSE 145* 94 160* 173* 126*  BUN 46* 55* 55* 62* 68*  CREATININE 2.96* 3.41* 3.25* 3.64* 3.85*  CALCIUM 8.8* 9.4 8.9 8.9 8.5*  MG 1.9  --   --   --   --   PHOS  --   --  4.3  --   --      CBC: Recent Labs  Lab 07/15/20 0359 07/16/20 0532 07/17/20 0615 07/18/20 0552  07/19/20 0152 07/20/20 0443  WBC 6.0 7.8 7.9 8.2 6.6 5.6  NEUTROABS 3.7  --   --   --   --   --   HGB 11.9* 11.7* 12.1* 11.8* 10.9* 9.5*  HCT 34.6* 33.7* 34.8* 34.5* 31.6* 29.1*  MCV 86.9 86.2 86.8 88.0 87.5 89.5  PLT 223 232 248 216 219 200     No results found for: HEPBSAG, HEPBSAB, HEPBIGM    Microbiology:  Recent Results (from the past 240 hour(s))  SARS Coronavirus 2 by RT PCR (hospital order, performed in [facility] hospital lab) Nasopharyngeal Nasopharyngeal Swab     Status: None   Collection Time: 07/15/20  3:59 AM   Specimen: Nasopharyngeal Swab  Result Value Ref Range Status   SARS Coronavirus 2 NEGATIVE NEGATIVE Final    Comment: (NOTE) SARS-CoV-2 target nucleic acids are NOT DETECTED.  The SARS-CoV-2 RNA is generally detectable in upper and lower respiratory specimens during the acute phase of infection. The lowest concentration of SARS-CoV-2 viral copies this assay can detect is 250 copies / mL. A negative result does not preclude SARS-CoV-2 infection and should not be used as the sole basis for treatment or other patient management decisions.  A negative result may occur  with improper specimen collection / handling, submission of specimen other than nasopharyngeal swab, presence of viral mutation(s) within the areas targeted by this assay, and inadequate number of viral copies (<250 copies / mL). A negative result must be combined with clinical observations, patient history, and epidemiological information.  Fact Sheet for Patients:   StrictlyIdeas.no  Fact Sheet for Healthcare Providers: BankingDealers.co.za  This test is not yet approved or  cleared by the [place] FDA and has been authorized for detection and/or diagnosis of SARS-CoV-2 by FDA under an Emergency Use Authorization (EUA).  This EUA will remain in effect (meaning this test can be used) for the duration of the COVID-19 declaration under  Section 564(b)(1) of the Act, 21 U.S.C. section 360bbb-3(b)(1), unless the authorization is terminated or revoked sooner.  Performed at [facility], [address] [place], [place] [address]   Culture, blood (single)     Status: None   Collection Time: 07/15/20  3:59 AM   Specimen: BLOOD  Result Value Ref Range Status   Specimen Description BLOOD RIGHT FA  Final   Special Requests   Final    BOTTLES DRAWN AEROBIC AND ANAEROBIC Blood Culture results may not be optimal due to an excessive volume of blood received in culture bottles   Culture   Final    NO GROWTH 5 DAYS Performed at [facility], [address], [place], [place] [address]    Report Status 07/20/2020 FINAL  Final    Coagulation Studies: No results for input(s): LABPROT, INR in the last 72 hours.  Urinalysis: No results for input(s): COLORURINE, LABSPEC, PHURINE, GLUCOSEU, HGBUR, BILIRUBINUR, KETONESUR, PROTEINUR, UROBILINOGEN, NITRITE, LEUKOCYTESUR in the last 72 hours.  Invalid input(s): APPERANCEUR    Imaging: CARDIAC CATHETERIZATION  Result Date: 07/19/2020  Prox RCA lesion is 20% stenosed.  Mid RCA lesion is 30% stenosed.  Dist RCA lesion is 50% stenosed with 50% stenosed side branch in RPAV.  Prox LAD lesion is 40% stenosed.  1st Diag lesion is 70% stenosed.  Mid LAD lesion is 60% stenosed.  1.  Moderately severely calcified coronary arteries with moderate two-vessel coronary artery disease involving the LAD and right coronary artery.  No evidence of critical stenosis. 2.  Right heart catheterization showed mildly elevated filling pressures, mild pulmonary hypertension and normal cardiac output. 3.  Left ventricular angiography was not performed due to chronic kidney disease.  EF was moderately reduced by echo. Recommendations: Recommend aggressive medical therapy for nonobstructive coronary artery disease.  Cardiomyopathy seems to be out of proportion to coronary artery disease. Monitor  for contrast-induced nephropathy.  Only 40 mL of contrast was used.  We will do gentle hydration for 6 hours.  I suspect that he will require an oral diuretic by tomorrow.   DG Chest Port 1 View  Result Date: 07/20/2020 CLINICAL DATA:  Chest pain. EXAM: PORTABLE CHEST 1 VIEW COMPARISON:  07/18/2020. FINDINGS: Borderline cardiomegaly again noted. No pulmonary venous congestion. Low lung volumes again noted. No focal infiltrate. No pleural effusion or pneumothorax. Degenerative change thoracic spine. IMPRESSION: 1. Borderline cardiomegaly again noted. No pulmonary venous congestion. 2. No acute pulmonary disease. Electronically Signed   By: Marcello Moores  Register   On: 07/20/2020 06:26     Medications:   . sodium chloride     . aspirin EC  81 mg Oral Daily  . atorvastatin  80 mg Oral Daily  . bisoprolol  10 mg Oral Daily  . citalopram  20 mg Oral Daily  .  feeding supplement (GLUCERNA SHAKE)  237 mL Oral BID BM  . heparin  5,000 Units Subcutaneous Q8H  . hydrALAZINE  100 mg Oral BID  . insulin aspart  0-5 Units Subcutaneous QHS  . insulin aspart  0-9 Units Subcutaneous TID WC  . [START ON 07/21/2020] insulin detemir  25 Units Subcutaneous Daily  . isosorbide mononitrate  120 mg Oral Daily  . multivitamin with minerals  1 tablet Oral Daily  . pantoprazole  40 mg Oral Daily  . sodium chloride flush  3 mL Intravenous Q12H   sodium chloride, acetaminophen, albuterol, alum & mag hydroxide-simeth, calcium carbonate, dextromethorphan-guaiFENesin, nitroGLYCERIN, ondansetron (ZOFRAN) IV, ondansetron, sodium chloride flush  Assessment/ Plan:  60 y.o. male with diabetes, hypertension, CHF  admitted on 07/15/2020 for SOB (shortness of breath) [R06.02] Elevated troponin [R77.8] Acute CHF (congestive heart failure) (HCC) [I50.9] Acute on chronic systolic congestive heart failure (HCC) [I50.23] Acute on chronic systolic CHF (congestive heart failure) ([place]) [I50.23]  07/16/2020: 2D echo: LVEF 30 to 35%,  global hypokinesis, moderately dilated left ventricular internal cavity, grade 2 diastolic dysfunction  #Acute kidney injury Likely secondary to ATN/Cardiorenal syndrome 01/31 0701 - 02/01 0700 In: 323.2 [P.O.:240; I.V.:83.2] Out: 600 [Urine:600] Lab Results  Component Value Date   CREATININE 3.85 (H) 07/20/2020   CREATININE 3.64 (H) 07/19/2020   CREATININE 3.25 (H) 07/18/2020  IV fluids were discontinued and patient complained of shortness of breath earlier today therefore 1 dose of Lasix was given with good results Currently on room air with no further shortness of breath Continue to monitor renal function closely No acute indication for dialysis at present  #Chronic kidney disease stage IV Baseline creatinine 2.96/GFR 24 from July 16, 2020 CKD likely secondary to diabetes, hypertension and atherosclerosis  #Non-STEMI Status post cardiac cath which shows moderate to severe calcified coronary arteries and two-vessel coronary disease involving LAD and RCA. Medical management recommended. 40 mL IV contrast used  #Type 2 diabetes with CKD Lab Results  Component Value Date   HGBA1C 9.7 (H) 07/15/2020  Obtain urine microalbumin creatinine ratio Statin-Crestor Was on Zestril at home 30 mg daily. We will be able to restart once creatinine stabilizes.    LOS: 5 Mahima Hottle 2/1/20223:29 PM  [facility] [place], [place]  Note: This note was prepared with Dragon dictation. Any transcription errors are unintentional

## 2020-07-20 NOTE — Progress Notes (Addendum)
Patient experiencing chest pain and shortness of breath.  Vital signs are stable.  Patient put on [facility] and given 2 nitroglycerin pills with slight relief.  Troponin was added on to morning labs and EKG was obtained.  Hassan Rowan, NP aware and ordered chest xray to evaluate for fluid.

## 2020-07-20 NOTE — Progress Notes (Signed)
Progress Note  Patient Name: Jimmy Olson Date of Encounter: 07/20/2020  Primary Cardiologist: Ida Rogue, MD  Subjective   Feels that his chest is full, throat is tight (phlegm), and having trouble breathing this AM.  Has been using O2 via  and saturating normally.  Notes mild chest discomfort that seems to worsen slightly w/ deep breathing.  No change w/ palpation or position changes.  Inpatient Medications    Scheduled Meds: . aspirin EC  81 mg Oral Daily  . bisoprolol  10 mg Oral Daily  . citalopram  20 mg Oral Daily  . feeding supplement (GLUCERNA SHAKE)  237 mL Oral BID BM  . heparin  5,000 Units Subcutaneous Q8H  . hydrALAZINE  100 mg Oral BID  . insulin aspart  0-5 Units Subcutaneous QHS  . insulin aspart  0-9 Units Subcutaneous TID WC  . insulin detemir  20 Units Subcutaneous Daily  . isosorbide mononitrate  120 mg Oral Daily  . multivitamin with minerals  1 tablet Oral Daily  . pantoprazole  40 mg Oral Daily  . rosuvastatin  40 mg Oral Daily  . sodium chloride flush  3 mL Intravenous Q12H   Continuous Infusions: . sodium chloride     PRN Meds: sodium chloride, acetaminophen, albuterol, alum & mag hydroxide-simeth, calcium carbonate, dextromethorphan-guaiFENesin, hydrALAZINE, nitroGLYCERIN, ondansetron (ZOFRAN) IV, ondansetron, sodium chloride flush   Vital Signs    Vitals:   07/19/20 2002 07/20/20 0431 07/20/20 0445 07/20/20 0834  BP: 133/64 124/67 114/68 (!) 141/73  Pulse: 74 75 78 73  Resp: '17 16 18 18  '$ Temp: 98.6 F (37 C) 98.1 F (36.7 C)  98.5 F (36.9 C)  TempSrc:  Oral  Oral  SpO2: 100% 100% 100% 98%  Weight:      Height:        Intake/Output Summary (Last 24 hours) at 07/20/2020 1005 Last data filed at 07/20/2020 0926 Gross per 24 hour  Intake 323.15 ml  Output 600 ml  Net -276.85 ml   Filed Weights   07/17/20 0509 07/18/20 0403 07/19/20 0300  Weight: 71.4 kg 71.6 kg 73.7 kg    Physical Exam   GEN: Well nourished, well  developed, in no acute distress.  HEENT: Grossly normal.  Neck: Supple, no carotid bruits, or masses. I don't appreciate any significant JVD. Cardiac: RRR, no murmurs, rubs, or gallops. No clubbing, cyanosis, edema.  Radials 2+, DP/PT 2+ and equal bilaterally. R radial/brachial cath sites w/o bleeding/bruit/hematoma. Respiratory:  Respirations regular and unlabored, bibasilar crackles. GI: Soft, mild RUQ tenderness to deep palpation, nondistended, BS + x 4. MS: no deformity or atrophy. Skin: warm and dry, no rash. Neuro:  Strength and sensation are intact. Psych: AAOx3.  Normal affect.  Labs    Chemistry Recent Labs  Lab 07/15/20 0359 07/16/20 0532 07/18/20 0552 07/19/20 0152 07/20/20 0443  NA 141   < > 140 139 140  K 3.7   < > 4.0 4.4 4.7  CL 108   < > 105 105 106  CO2 25   < > '25 23 26  '$ GLUCOSE 245*   < > 160* 173* 126*  BUN 42*   < > 55* 62* 68*  CREATININE 3.00*   < > 3.25* 3.64* 3.85*  CALCIUM 8.5*   < > 8.9 8.9 8.5*  PROT 6.3*  --   --   --   --   ALBUMIN 3.0*  --   --   --   --  AST 18  --   --   --   --   ALT 15  --   --   --   --   ALKPHOS 81  --   --   --   --   BILITOT 0.5  --   --   --   --   GFRNONAA 23*   < > 21* 19* 17*  ANIONGAP 8   < > '10 11 8   '$ < > = values in this interval not displayed.     Hematology Recent Labs  Lab 07/18/20 0552 07/19/20 0152 07/20/20 0443  WBC 8.2 6.6 5.6  RBC 3.92* 3.61* 3.25*  HGB 11.8* 10.9* 9.5*  HCT 34.5* 31.6* 29.1*  MCV 88.0 87.5 89.5  MCH 30.1 30.2 29.2  MCHC 34.2 34.5 32.6  RDW 12.0 12.1 12.0  PLT 216 219 200    Cardiac Enzymes  Recent Labs  Lab 07/15/20 1124 07/15/20 1943 07/15/20 2240 07/17/20 1026 07/20/20 0443  TROPONINIHS 176* 190* 200* 133* 94*      BNP Recent Labs  Lab 07/15/20 0359 07/18/20 0552 07/20/20 0443  BNP 834.1* 418.7* 584.1*      Lipids  Lab Results  Component Value Date   CHOL 186 07/16/2020   HDL 40 (L) 07/16/2020   LDLCALC 110 (H) 07/16/2020   TRIG 178 (H)  07/16/2020   CHOLHDL 4.7 07/16/2020    HbA1c  Lab Results  Component Value Date   HGBA1C 9.7 (H) 07/15/2020    Radiology    DG Chest 1 View  Result Date: 07/18/2020 CLINICAL DATA:  In patient follow-up of acute tubular necrosis. EXAM: CHEST  1 VIEW COMPARISON:  Yesterday FINDINGS: Borderline heart size. Unremarkable aortic and hilar contours. There is no edema, consolidation, effusion, or pneumothorax. IMPRESSION: Low volume but clear lungs. Electronically Signed   By: Monte Fantasia M.D.   On: 07/18/2020 11:35   DG Chest 1 View  Result Date: 07/17/2020 CLINICAL DATA:  60 year old male with a history of acute tubular necrosis EXAM: CHEST  1 VIEW COMPARISON:  07/15/2020 FINDINGS: Cardiomediastinal silhouette unchanged in size and contour. Low lung volumes with slight improved aeration at the lung bases. No pneumothorax or pleural effusion. IMPRESSION: Slight improved airspace opacity at the right lung base with persisting low lung volumes. Electronically Signed   By: Corrie Mckusick D.O.   On: 07/17/2020 12:43   US RENAL  Result Date: 07/17/2020 CLINICAL DATA:  Acute tubular necrosis, history diabetes mellitus, hypertension, cardiomyopathy EXAM: RENAL / URINARY TRACT ULTRASOUND COMPLETE COMPARISON:  CT abdomen and pelvis 02/01/2007 FINDINGS: Right Kidney: Renal measurements: 11.1 x 5.5 x 5.2 cm = volume: 167 mL. Normal cortical thickness. Increased cortical echogenicity. No mass, hydronephrosis, or shadowing calcification. Left Kidney: Renal measurements: 10.5 x 5.8 x 7.3 cm = volume: 234 mL. Normal cortical thickness. Increased cortical echogenicity. No mass, hydronephrosis, or shadowing calcification. Bladder: Appears normal for degree of bladder distention. Other: Prostate gland enlarged 5.1 x 4.2 x 5.3 cm (volume = 59 cm^3) IMPRESSION: Medical renal disease changes of both kidneys. No evidence of renal mass or hydronephrosis. Prostatic enlargement. Electronically Signed   By: Lavonia Dana M.D.    On: 07/17/2020 17:02   DG Chest Port 1 View  Result Date: 07/20/2020 CLINICAL DATA:  Chest pain. EXAM: PORTABLE CHEST 1 VIEW COMPARISON:  07/18/2020. FINDINGS: Borderline cardiomegaly again noted. No pulmonary venous congestion. Low lung volumes again noted. No focal infiltrate. No pleural effusion or pneumothorax. Degenerative change thoracic spine.  IMPRESSION: 1. Borderline cardiomegaly again noted. No pulmonary venous congestion. 2. No acute pulmonary disease. Electronically Signed   By: Marcello Moores  Register   On: 07/20/2020 06:26    Telemetry    RSR - Personally Reviewed  Cardiac Studies   Echocardiogram on January 28 of 2022:   1. Left ventricular ejection fraction, by estimation, is 30 to 35%. The  left ventricle has moderately decreased function. The left ventricle  demonstrates global hypokinesis. The left ventricular internal cavity size  was moderately dilated. There is mild   left ventricular hypertrophy. Left ventricular diastolic parameters are  consistent with Grade II diastolic dysfunction (pseudonormalization). The  average left ventricular global longitudinal strain is -10.1 %. The global  longitudinal strain is abnormal.   2. Right ventricular systolic function is normal. The right ventricular  size is normal. Tricuspid regurgitation signal is inadequate for assessing  PA pressure.   3. Left atrial size was mildly dilated.   4. The inferior vena cava is normal in size with greater than 50%  respiratory variability, suggesting right atrial pressure of 3 mmHg. _____________   Right and left cardiac catheterization July 19, 2020:   Left Main  The vessel exhibits minimal luminal irregularities.  Left Anterior Descending  Prox LAD lesion is 40% stenosed. The lesion is moderately calcified.  Mid LAD lesion is 60% stenosed. The lesion is severely calcified.  First Diagonal Branch  Vessel is large in size.  1st Diag lesion is 70% stenosed.  Second Diagonal Branch   Vessel is small in size. The vessel exhibits minimal luminal irregularities.  Third Diagonal Branch  Vessel is angiographically normal.  Ramus Intermedius  There is mild diffuse disease throughout the vessel.  Left Circumflex  Vessel is small. There is mild diffuse disease throughout the vessel.  Right Coronary Artery  Prox RCA lesion is 20% stenosed. The lesion is moderately calcified.  Mid RCA lesion is 30% stenosed. The lesion is moderately calcified.  Dist RCA lesion is 50% stenosed with 50% stenosed side branch in RPAV. The lesion is moderately calcified.     RA10, RV 38/5, PA37/16(26), PCWP 16. LVEDP 19.   Recommendations: Recommend aggressive medical therapy for nonobstructive coronary artery disease.  Cardiomyopathy seems to be out of proportion to coronary artery disease. Monitor for contrast-induced nephropathy.  Only 40 mL of contrast was used.  We will do gentle hydration for 6 hours.  I suspect that he will require an oral diuretic by tomorrow.  Patient Profile     60 y.o. male with history of chronic systolic heart failure, history of Covid infection in September, poorly controlled hypertension, hyperlipidemia, type 2 diabetes and chronic kidney disease IV, who presented with acute on chronic systolic heart failure and elevated troponin.  Found to have NICM.  Assessment & Plan    1.  Acute on chronic combined systolic and diastolic /NICM:  Admitted 1/27 w/ progressive angina, hypertensive urgency, AKI, volume overload, and NSTEMI.  Echo w/ EF of 30-35% w/ gr2 DD.  He has responded well to IV diuresis throughout admission and is minus 3.7L.  Cath 1/31 w/ mod nonobs CAD and mildly elevated filling pressures.  He c/o more shortness of breath this AM w/ chest fullness.  I don't see significant JVD, but he has crackles @ bilat bases.  Lasix 40 IV x 1 just ordered by primary team.  Assess response.  Will likely benefit from oral torsemide - defer to nephrology.  Cont  blocker,  hydral/nitrate.  No acei/arb/arni/mra in  setting of acute on CKD IV.  2.  Demand Ischemia/CAD:  Peak HsTrop of 200 in the setting of above.  Moderate nonobstructive LAD and D1 dzs on cath.  Suspect demand ischemia.  C/o chest fullness this AM - somewhat worse w/ deep breathing.  As above, also c/o dyspnea w/ crackles on exam.  Lasix about to be given.  Cont asa,  blocker, nitrate, statin. If persistent chest fullness despite improvement in dyspnea/volume, can consider further titration of  blocker vs low dose ccb for antianginal effects.  Already on imdur 120.  Consider P2Y12 in setting of trop elevation, though mildly anemic.  3.  Essential HTN:  Mostly stable w/ trends in 1-teens to 130's.  Higher this AM. Cont  blocker, hydral/nitrate.  4.  HL:  LDL 122 - not at goal. Reportedly on crestor 10 @ home.  Dose limited to '10mg'$  by CKD.  Will change to high potency lipitor.  5.  Acute on chronic stage IV kidney dzs:  S/p contrast yesterday.  Creat up from 3.6\45  3.85.  Nephrology following.  Home dose of lisinopril remains on hold.  6.  Normocytic anemia:  H/H drifting down - 9.5/29.1 this am.  Follow.  Signed, Murray Hodgkins, NP  07/20/2020, 10:05 AM    For questions or updates, please contact   Please consult [email or web address] for contact info under Cardiology/STEMI.

## 2020-07-20 NOTE — Progress Notes (Signed)
Pt having c/p (pressure), SOB, palpitations, and abdominal discomfort. Given nitro x3 in total, zofran, and mucinex for productive cough. VSS & BP stable with nitro admin. NP notified & thinking fluid related. Trop, BNP, & CXR ordered. Will continue to monitor.

## 2020-07-20 NOTE — Progress Notes (Signed)
Using [place] interpreter. Patient stating he has pressure feeling in his chest that he says is being caused by flem build up.Says it is causing him to need the oxygen. States he does not have oxygen at home and concerned what if he will need it when he is discharged. Mucinex given at 0438 See MAR. MD secure chatted at this time with update. Patient's breakfast tray ordered by RN. Patient denies further needs at this time.

## 2020-07-21 ENCOUNTER — Encounter: Payer: Self-pay | Admitting: Family Medicine

## 2020-07-21 DIAGNOSIS — I2510 Atherosclerotic heart disease of native coronary artery without angina pectoris: Secondary | ICD-10-CM

## 2020-07-21 DIAGNOSIS — I5023 Acute on chronic systolic (congestive) heart failure: Secondary | ICD-10-CM | POA: Diagnosis not present

## 2020-07-21 DIAGNOSIS — N170 Acute kidney failure with tubular necrosis: Secondary | ICD-10-CM | POA: Diagnosis not present

## 2020-07-21 DIAGNOSIS — I25118 Atherosclerotic heart disease of native coronary artery with other forms of angina pectoris: Secondary | ICD-10-CM

## 2020-07-21 LAB — BASIC METABOLIC PANEL
Anion gap: 10 (ref 5–15)
BUN: 73 mg/dL — ABNORMAL HIGH (ref 6–20)
CO2: 23 mmol/L (ref 22–32)
Calcium: 8.6 mg/dL — ABNORMAL LOW (ref 8.9–10.3)
Chloride: 104 mmol/L (ref 98–111)
Creatinine, Ser: 4.7 mg/dL — ABNORMAL HIGH (ref 0.61–1.24)
GFR, Estimated: 14 mL/min — ABNORMAL LOW (ref >60)
Glucose, Bld: 193 mg/dL — ABNORMAL HIGH (ref 70–99)
Potassium: 4.7 mmol/L (ref 3.5–5.1)
Sodium: 137 mmol/L (ref 135–145)

## 2020-07-21 LAB — GLUCOSE, CAPILLARY
Glucose-Capillary: 156 mg/dL — ABNORMAL HIGH (ref 70–99)
Glucose-Capillary: 290 mg/dL — ABNORMAL HIGH (ref 70–99)
Glucose-Capillary: 290 mg/dL — ABNORMAL HIGH (ref 70–99)
Glucose-Capillary: 203 mg/dL — ABNORMAL HIGH (ref 70–99)

## 2020-07-21 NOTE — Progress Notes (Signed)
Mobility Specialist - Progress Note   07/21/20 1400  Mobility  Activity Ambulated in room  Range of Motion/Exercises Right leg;Left leg (AP, SLR, March)  Level of Assistance Independent  Assistive Device None  Distance Ambulated (ft) 20 ft  Mobility Response Tolerated well  Mobility performed by Mobility specialist  $Mobility charge 1 Mobility    Pre-mobility: 70 HR, 97% SpO2 Post-mobility: 71 HR, 98% SpO2   Pt was standing at bedside upon arrival utilizing room air. Pt agreed to session. [place] Interpreter on phone: Verdis Frederickson [phone]. Pt denied nausea and fatigue, but did c/o chest pain "3/10". Nurse already made aware. Pt ambulated 20' in room without use of AD. No LOB noted. Pt does c/o feeling "weak and tired" in LE. Pt returned EOB and participated in seated exercises: ankle pumps x10, straight leg raises x10, and seated march x20. Pt stated that pain level remains at a 3 after activity. Overall, pt tolerated session well. Pt was left EOB with all needs in reach. Nurse notified.    Kathee Delton Mobility Specialist 07/21/20, 2:07 PM

## 2020-07-21 NOTE — Progress Notes (Signed)
Nutrition Follow Up Note   DOCUMENTATION CODES:   Not applicable  INTERVENTION:   Glucerna Shake po BID, each supplement provides 220 kcal and 10 grams of protein  MVI with minerals po daily  NUTRITION DIAGNOSIS:   Inadequate oral intake related to decreased appetite as evidenced by per patient/family report.  GOAL:   Patient will meet greater than or equal to 90% of their needs -progressing   MONITOR:   Weight trends,Labs,I & O's,PO intake,Supplement acceptance  ASSESSMENT:   60 year old male with history significant of sCHF with EF 45-50%, HTN, HLD, DM2, recent COVID-19 infection and CKD IV who is admitted with AKI, NSTEMI and acute on chronic sCHF. Pt s/p cardiac cath 2/1.  RD working remotely.  Pt with improved appetite and oral intake; pt eating 100% of meals and drinking Glucerna supplements. Per chart, pt is up ~10lbs since admit and appears to be up ~6lbs from his UBW.    Medications reviewed and include: aspirin, celexa, heparin, insulin, MVI, protonix  Labs reviewed: BUN 73(H), creat 4.70(H) Hgb 9.5(L), Hct 29.1(L) cbgs- 201, 212, 152, 227, 156 x 24 hrs  Diet Order:   Diet Order            Diet Heart Room service appropriate? Yes; Fluid consistency: Thin  Diet effective now                EDUCATION NEEDS:   Education needs have been addressed  Skin:  Skin Assessment: Reviewed RN Assessment  Last BM:  1/31  Height:   Ht Readings from Last 1 Encounters:  07/15/20 '5\' 5"'$  (1.651 m)    Weight:   Wt Readings from Last 1 Encounters:  07/21/20 74.6 kg   BMI:  Body mass index is 27.37 kg/m.  Estimated Nutritional Needs:   Kcal:  1800-2100kcal/day  Protein:  90-105g/day  Fluid:  1.5 L per MD  Koleen Distance MS, RD, LDN Please refer to [facility] for RD and/or RD on-call/weekend/after hours pager

## 2020-07-21 NOTE — Progress Notes (Signed)
[facility], [name] 07/21/20  Subjective:   Hospital day # 6  States he has fatigue Currently on room air No distress in breathing Able to void without any problems Serum creatinine trend is worsening Renal: 02/01 0701 - 02/02 0700 In: 1140 [P.O.:1140] Out: 1120 [Urine:1120] Lab Results  Component Value Date   CREATININE 4.70 (H) 07/21/2020   CREATININE 3.85 (H) 07/20/2020   CREATININE 3.64 (H) 07/19/2020     Objective:  Vital signs in last 24 hours:  Temp:  [97.7 F (36.5 C)-98.8 F (37.1 C)] 98.4 F (36.9 C) (02/02 1500) Pulse Rate:  [70-75] 71 (02/02 1500) Resp:  [15-20] 15 (02/02 1500) BP: (126-139)/(59-72) 139/72 (02/02 1500) SpO2:  [98 %-100 %] 100 % (02/02 1500) Weight:  [74.6 kg] 74.6 kg (02/02 0500)  Weight change:  Filed Weights   07/18/20 0403 07/19/20 0300 07/21/20 0500  Weight: 71.6 kg 73.7 kg 74.6 kg    Intake/Output:    Intake/Output Summary (Last 24 hours) at 07/21/2020 1610 Last data filed at 07/21/2020 1400 Gross per 24 hour  Intake 840 ml  Output 870 ml  Net -30 ml    Physical Exam: General:  No acute distress, laying in the bed  HEENT  anicteric, moist oral mucous membrane  Pulm/lungs  normal breathing effort, lungs are clear to auscultation  CVS/Heart  regular rhythm, no rub or gallop  Abdomen:   Soft, tender  Extremities:  No peripheral edema  Neurologic:  Alert, oriented, able to follow commands  Skin:  No acute rashes     Basic Metabolic Panel:  Recent Labs  Lab 07/16/20 0532 07/17/20 0615 07/18/20 0552 07/19/20 0152 07/20/20 0443 07/21/20 0837  NA 144 143 140 139 140 137  K 3.4* 4.0 4.0 4.4 4.7 4.7  CL 106 106 105 105 106 104  CO2 '29 29 25 23 26 23  '$ GLUCOSE 145* 94 160* 173* 126* 193*  BUN 46* 55* 55* 62* 68* 73*  CREATININE 2.96* 3.41* 3.25* 3.64* 3.85* 4.70*  CALCIUM 8.8* 9.4 8.9 8.9 8.5* 8.6*  MG 1.9  --   --   --   --   --   PHOS  --   --  4.3  --   --   --      CBC: Recent  Labs  Lab 07/15/20 0359 07/16/20 0532 07/17/20 0615 07/18/20 0552 07/19/20 0152 07/20/20 0443  WBC 6.0 7.8 7.9 8.2 6.6 5.6  NEUTROABS 3.7  --   --   --   --   --   HGB 11.9* 11.7* 12.1* 11.8* 10.9* 9.5*  HCT 34.6* 33.7* 34.8* 34.5* 31.6* 29.1*  MCV 86.9 86.2 86.8 88.0 87.5 89.5  PLT 223 232 248 216 219 200     No results found for: HEPBSAG, HEPBSAB, HEPBIGM    Microbiology:  Recent Results (from the past 240 hour(s))  SARS Coronavirus 2 by RT PCR (hospital order, performed in [place] hospital lab) Nasopharyngeal Nasopharyngeal Swab     Status: None   Collection Time: 07/15/20  3:59 AM   Specimen: Nasopharyngeal Swab  Result Value Ref Range Status   SARS Coronavirus 2 NEGATIVE NEGATIVE Final    Comment: (NOTE) SARS-CoV-2 target nucleic acids are NOT DETECTED.  The SARS-CoV-2 RNA is generally detectable in upper and lower respiratory specimens during the acute phase of infection. The lowest concentration of SARS-CoV-2 viral copies this assay can detect is 250 copies / mL. A negative result does not preclude SARS-CoV-2 infection and  should not be used as the sole basis for treatment or other patient management decisions.  A negative result may occur with improper specimen collection / handling, submission of specimen other than nasopharyngeal swab, presence of viral mutation(s) within the areas targeted by this assay, and inadequate number of viral copies (<250 copies / mL). A negative result must be combined with clinical observations, patient history, and epidemiological information.  Fact Sheet for Patients:   StrictlyIdeas.no  Fact Sheet for Healthcare Providers: BankingDealers.co.za  This test is not yet approved or  cleared by the [place] FDA and has been authorized for detection and/or diagnosis of SARS-CoV-2 by FDA under an Emergency Use Authorization (EUA).  This EUA will remain in effect (meaning this  test can be used) for the duration of the COVID-19 declaration under Section 564(b)(1) of the Act, 21 U.S.C. section 360bbb-3(b)(1), unless the authorization is terminated or revoked sooner.  Performed at [facility], [address] [place], [place] [address]   Culture, blood (single)     Status: None   Collection Time: 07/15/20  3:59 AM   Specimen: BLOOD  Result Value Ref Range Status   Specimen Description BLOOD RIGHT FA  Final   Special Requests   Final    BOTTLES DRAWN AEROBIC AND ANAEROBIC Blood Culture results may not be optimal due to an excessive volume of blood received in culture bottles   Culture   Final    NO GROWTH 5 DAYS Performed at [facility], [address], [place], [place] [address]    Report Status 07/20/2020 FINAL  Final    Coagulation Studies: No results for input(s): LABPROT, INR in the last 72 hours.  Urinalysis: No results for input(s): COLORURINE, LABSPEC, PHURINE, GLUCOSEU, HGBUR, BILIRUBINUR, KETONESUR, PROTEINUR, UROBILINOGEN, NITRITE, LEUKOCYTESUR in the last 72 hours.  Invalid input(s): APPERANCEUR    Imaging: DG Chest Port 1 View  Result Date: 07/20/2020 CLINICAL DATA:  Chest pain. EXAM: PORTABLE CHEST 1 VIEW COMPARISON:  07/18/2020. FINDINGS: Borderline cardiomegaly again noted. No pulmonary venous congestion. Low lung volumes again noted. No focal infiltrate. No pleural effusion or pneumothorax. Degenerative change thoracic spine. IMPRESSION: 1. Borderline cardiomegaly again noted. No pulmonary venous congestion. 2. No acute pulmonary disease. Electronically Signed   By: Marcello Moores  Register   On: 07/20/2020 06:26     Medications:   . sodium chloride     . aspirin EC  81 mg Oral Daily  . atorvastatin  80 mg Oral Daily  . bisoprolol  10 mg Oral Daily  . citalopram  20 mg Oral Daily  . feeding supplement (GLUCERNA SHAKE)  237 mL Oral BID BM  . heparin  5,000 Units Subcutaneous Q8H  . hydrALAZINE  100 mg Oral BID  .  insulin aspart  0-5 Units Subcutaneous QHS  . insulin aspart  0-9 Units Subcutaneous TID WC  . insulin detemir  25 Units Subcutaneous Daily  . isosorbide mononitrate  120 mg Oral Daily  . multivitamin with minerals  1 tablet Oral Daily  . pantoprazole  40 mg Oral Daily  . sodium chloride flush  3 mL Intravenous Q12H   sodium chloride, acetaminophen, albuterol, alum & mag hydroxide-simeth, calcium carbonate, dextromethorphan-guaiFENesin, nitroGLYCERIN, ondansetron (ZOFRAN) IV, ondansetron, sodium chloride flush  Assessment/ Plan:  60 y.o. male with diabetes, hypertension, CHF  admitted on 07/15/2020 for SOB (shortness of breath) [R06.02] Elevated troponin [R77.8] Acute CHF (congestive heart failure) (HCC) [I50.9] Acute on chronic systolic congestive heart failure (HCC) [I50.23] Acute on chronic  systolic CHF (congestive heart failure) ([place]) [I50.23]  07/16/2020: 2D echo: LVEF 30 to 35%, global hypokinesis, moderately dilated left ventricular internal cavity, grade 2 diastolic dysfunction  #Acute kidney injury Likely secondary to ATN/Cardiorenal syndrome 02/01 0701 - 02/02 0700 In: 1140 [P.O.:1140] Out: 1120 [Urine:1120] Lab Results  Component Value Date   CREATININE 4.70 (H) 07/21/2020   CREATININE 3.85 (H) 07/20/2020   CREATININE 3.64 (H) 07/19/2020  Currently on room air with no further shortness of breath Continue to monitor renal function closely No acute indication for dialysis at present, but with worsening renal function, HD may be required this admission.   #Chronic kidney disease stage IV Baseline creatinine 2.96/GFR 24 from July 16, 2020 CKD likely secondary to diabetes, hypertension and atherosclerosis  #Non-STEMI Status post cardiac cath which shows moderate to severe calcified coronary arteries and two-vessel coronary disease involving LAD and RCA. Medical management recommended. 40 mL IV contrast used  #Type 2 diabetes with CKD Lab Results  Component Value  Date   HGBA1C 9.7 (H) 07/15/2020  Obtain urine microalbumin creatinine ratio Statin-Crestor Was on Zestril at home 30 mg daily. We will be able to restart once creatinine stabilizes.    LOS: [place] 2/2/20224:10 [facility] [place], [place]  Note: This note was prepared with Dragon dictation. Any transcription errors are unintentional

## 2020-07-21 NOTE — Progress Notes (Signed)
Progress Note  Patient Name: Jimmy Olson Date of Encounter: 07/21/2020  [facility] Cardiologist: Ida Rogue, MD   Subjective   Feels better, shortness of breath is much improved compared to yesterday. Received IV Lasix 40x1. Creatinine worsened today.  Inpatient Medications    Scheduled Meds: . aspirin EC  81 mg Oral Daily  . atorvastatin  80 mg Oral Daily  . bisoprolol  10 mg Oral Daily  . citalopram  20 mg Oral Daily  . feeding supplement (GLUCERNA SHAKE)  237 mL Oral BID BM  . heparin  5,000 Units Subcutaneous Q8H  . hydrALAZINE  100 mg Oral BID  . insulin aspart  0-5 Units Subcutaneous QHS  . insulin aspart  0-9 Units Subcutaneous TID WC  . insulin detemir  25 Units Subcutaneous Daily  . isosorbide mononitrate  120 mg Oral Daily  . multivitamin with minerals  1 tablet Oral Daily  . pantoprazole  40 mg Oral Daily  . sodium chloride flush  3 mL Intravenous Q12H   Continuous Infusions: . sodium chloride     PRN Meds: sodium chloride, acetaminophen, albuterol, alum & mag hydroxide-simeth, calcium carbonate, dextromethorphan-guaiFENesin, nitroGLYCERIN, ondansetron (ZOFRAN) IV, ondansetron, sodium chloride flush   Vital Signs    Vitals:   07/21/20 0500 07/21/20 0509 07/21/20 0722 07/21/20 1106  BP:  134/67 132/70 131/62  Pulse:  73 71 70  Resp:  '18 16 16  '$ Temp:  98 F (36.7 C) 98.8 F (37.1 C) 98.7 F (37.1 C)  TempSrc:  Oral Oral   SpO2:  98% 99% 100%  Weight: 74.6 kg     Height:        Intake/Output Summary (Last 24 hours) at 07/21/2020 1216 Last data filed at 07/21/2020 [id number] Gross per 24 hour  Intake 840 ml  Output 1120 ml  Net -280 ml   Last 3 Weights 07/21/2020 07/19/2020 07/18/2020  Weight (lbs) 164 lb 7.4 oz 162 lb 8 oz 157 lb 14.4 oz  Weight (kg) 74.6 kg 73.71 kg 71.623 kg      Telemetry    Sinus rhythm- Personally Reviewed  ECG    No new tracing reviewed- Personally Reviewed  Physical Exam   GEN: No acute distress.   Neck: No  JVD Cardiac: RRR, no murmurs, rubs, or gallops.  Respiratory:  Diminished breath sounds at bases GI: Soft, nontender, non-distended  MS: No edema; No deformity. Neuro:  Nonfocal  Psych: Normal affect   Labs    High Sensitivity Troponin:   Recent Labs  Lab 07/15/20 1124 07/15/20 1943 07/15/20 2240 07/17/20 1026 07/20/20 0443  TROPONINIHS 176* 190* 200* 133* 94*      Chemistry Recent Labs  Lab 07/15/20 0359 07/16/20 0532 07/19/20 0152 07/20/20 0443 07/21/20 0837  NA 141   < > 139 140 137  K 3.7   < > 4.4 4.7 4.7  CL 108   < > 105 106 104  CO2 25   < > '23 26 23  '$ GLUCOSE 245*   < > 173* 126* 193*  BUN 42*   < > 62* 68* 73*  CREATININE 3.00*   < > 3.64* 3.85* 4.70*  CALCIUM 8.5*   < > 8.9 8.5* 8.6*  PROT 6.3*  --   --   --   --   ALBUMIN 3.0*  --   --   --   --   AST 18  --   --   --   --   ALT 15  --   --   --   --  ALKPHOS 81  --   --   --   --   BILITOT 0.5  --   --   --   --   GFRNONAA 23*   < > 19* 17* 14*  ANIONGAP 8   < > '11 8 10   '$ < > = values in this interval not displayed.     Hematology Recent Labs  Lab 07/18/20 0552 07/19/20 0152 07/20/20 0443  WBC 8.2 6.6 5.6  RBC 3.92* 3.61* 3.25*  HGB 11.8* 10.9* 9.5*  HCT 34.5* 31.6* 29.1*  MCV 88.0 87.5 89.5  MCH 30.1 30.2 29.2  MCHC 34.2 34.5 32.6  RDW 12.0 12.1 12.0  PLT 216 219 200    BNP Recent Labs  Lab 07/15/20 0359 07/18/20 0552 07/20/20 0443  BNP 834.1* 418.7* 584.1*     DDimer No results for input(s): DDIMER in the last 168 hours.   Radiology    DG Chest Port 1 View  Result Date: 07/20/2020 CLINICAL DATA:  Chest pain. EXAM: PORTABLE CHEST 1 VIEW COMPARISON:  07/18/2020. FINDINGS: Borderline cardiomegaly again noted. No pulmonary venous congestion. Low lung volumes again noted. No focal infiltrate. No pleural effusion or pneumothorax. Degenerative change thoracic spine. IMPRESSION: 1. Borderline cardiomegaly again noted. No pulmonary venous congestion. 2. No acute pulmonary disease.  Electronically Signed   By: Marcello Moores  Register   On: 07/20/2020 06:26    Cardiac Studies   Echo 07/16/2020 Left ventricular ejection fraction, by estimation, is 30 to 35%. The  left ventricle has moderately decreased function. The left ventricle  demonstrates global hypokinesis. The left ventricular internal cavity size  was moderately dilated. There is mild  left ventricular hypertrophy. Left ventricular diastolic parameters are  consistent with Grade II diastolic dysfunction (pseudonormalization). The  average left ventricular global longitudinal strain is -10.1 %. The global  longitudinal strain is abnormal.  2. Right ventricular systolic function is normal. The right ventricular  size is normal. Tricuspid regurgitation signal is inadequate for assessing  PA pressure.  3. Left atrial size was mildly dilated.  4. The inferior vena cava is normal in size with greater than 50%  respiratory variability, suggesting right atrial pressure of 3 mmHg.   LHC 07/19/2020  Prox RCA lesion is 20% stenosed.  Mid RCA lesion is 30% stenosed.  Dist RCA lesion is 50% stenosed with 50% stenosed side branch in RPAV.  Prox LAD lesion is 40% stenosed.  1st Diag lesion is 70% stenosed.  Mid LAD lesion is 60% stenosed.   1.  Moderately severely calcified coronary arteries with moderate two-vessel coronary artery disease involving the LAD and right coronary artery.  No evidence of critical stenosis. 2.  Right heart catheterization showed mildly elevated filling pressures, mild pulmonary hypertension and normal cardiac output. 3.  Left ventricular angiography was not performed due to chronic kidney disease.  EF was moderately reduced by echo.    Patient Profile     60 y.o. male with history of hypertension, hyperlipidemia, nonischemic cardiomyopathy, EF 30 to 35% presenting with shortness of breath. Being seen for heart failure. Right/Left heart cath showed nonobstructive CAD.  Assessment &  Plan    1. NICM, EF 30 to 35% -Holding Lasix today due to worsening creatinine. -Shortness of breath overall improved -Avoid nephrotoxic's -Bisoprolol, hydralazine, Imdur.  2. Non obstructive CAD -Aspirin, Lipitor  3. CKD -Worsening creatinine -Avoid nephrotoxic's -Follow nephrology recs   Total encounter time 35 minutes  Greater than 50% was spent in counseling and coordination of care  with the patient  Signed, Kate Sable, MD  07/21/2020, 12:16 PM

## 2020-07-21 NOTE — Progress Notes (Signed)
Inpatient Diabetes Program Recommendations  AACE/ADA: New Consensus Statement on Inpatient Glycemic Control (2015)  Target Ranges:  Prepandial:   less than 140 mg/dL      Peak postprandial:   less than 180 mg/dL (1-2 hours)      Critically ill patients:  140 - 180 mg/dL   Results for Jimmy Olson, DRACHENBERG (MRN [id number]) as of 07/21/2020 12:19  Ref. Range 07/21/2020 07:23 07/21/2020 11:06  Glucose-Capillary Latest Ref Range: 70 - 99 mg/dL 156 (H)  2 units NOVOLOG  25 units LEVEMIR 290 (H)   Home DM Meds: Levemir 45 units BID                             Humalog 5 units TID with meals   Current Orders: Levemir 25 units Daily       Novolog 0-9 TID ac + hs     MD- Note Levemir increased this AM.  CBG 290 at 12pm.  Please consider starting Novolog Meal Coverage:  Novolog 4 units TID with meals    --Will follow patient during hospitalization--  Wyn Quaker RN, MSN, CDE Diabetes Coordinator Inpatient Glycemic Control Team Team Pager: [phone] (8a-5p)

## 2020-07-21 NOTE — Progress Notes (Addendum)
Progress Note    Jimmy Olson  [id number] DOB: 05-Apr-1961  DOA: 07/15/2020 [facility]: [facility] [place]      Brief Narrative:    Medical records reviewed and are as summarized below:  Jimmy Olson is a 60 y.o. male with medical history significant ofsCHF withEF of 45 to 50%, hypertension, hyperlipidemia, diabetes mellitus, COVID-19 infection 4 months ago, CKD 3B, to the hospital with chest pain and chest tightness.  He was admitted to the hospital for acute on chronic systolic and diastolic CHF complicated by acute hypoxic respiratory failure.  He was initially treated with IV heparin infusion because of concern for acute NSTEMI.  He underwent left and right heart cath there was no evidence of critical stenosis.  However NSTEMI was ruled out.  Elevated troponins were attributed to demand ischemia.  He was treated with IV Lasix and oxygen.     Assessment/Plan:   Principal Problem:   Severe recurrent major depression without psychotic features (Jimmy Olson) Active Problems:   Essential hypertension   Elevated troponin   Type II diabetes mellitus with renal manifestations (HCC)   Acute on chronic HFrEF (heart failure with reduced ejection fraction) (HCC)   Acute renal failure superimposed on stage 3b chronic kidney disease (HCC)   HLD (hyperlipidemia)   ATN (acute tubular necrosis) (HCC)   Demand ischemia (HCC)   Coronary artery disease involving native coronary artery of native heart without angina pectoris   Nutrition Problem: Inadequate oral intake Etiology: decreased appetite  Signs/Symptoms: per patient/family report   Body mass index is 27.37 kg/m.     Acute on chronic systolic and diastolic CHF: Lasix has been held because of worsening creatinine  Acute hypoxic respiratory failure: He is on 1 L/min oxygen via nasal cannula.  Taper off oxygen as able.  AKI on CKD stage IV: Creatinine is going up.  Follow-up with nephrologist for further  recommendations.  Nonobstructive CAD, elevated troponins: Elevated troponins attributed to demand ischemia.  S/p left and right heart cath which showed moderately severely calcified coronary arteries with moderate two-vessel coronary artery disease involving LAD and right coronary artery.  There was no evidence of critical stenosis. Continue aspirin, Imdur and Lipitor.  Hypertension: Continue metoprolol  Depression: He was evaluated by the psychiatrist on 07/19/2020.  He does not meet criteria for psychiatric inpatient admission.  Continue Celexa.   Diet Order            Diet Heart Room service appropriate? Yes; Fluid consistency: Thin  Diet effective now                    Consultants:  Cardiologist  Nephrologist  Procedures:  Left and right heart cath on 07/19/2020    Medications:   . aspirin EC  81 mg Oral Daily  . atorvastatin  80 mg Oral Daily  . bisoprolol  10 mg Oral Daily  . citalopram  20 mg Oral Daily  . feeding supplement (GLUCERNA SHAKE)  237 mL Oral BID BM  . heparin  5,000 Units Subcutaneous Q8H  . hydrALAZINE  100 mg Oral BID  . insulin aspart  0-5 Units Subcutaneous QHS  . insulin aspart  0-9 Units Subcutaneous TID WC  . insulin detemir  25 Units Subcutaneous Daily  . isosorbide mononitrate  120 mg Oral Daily  . multivitamin with minerals  1 tablet Oral Daily  . pantoprazole  40 mg Oral Daily  . sodium chloride flush  3 mL Intravenous  Q12H   Continuous Infusions: . sodium chloride       Anti-infectives (From admission, onward)   None             Family Communication/Anticipated D/C date and plan/Code Status   DVT prophylaxis: heparin injection 5,000 Units Start: 07/19/20 2200     Code Status: Full Code  Family Communication: None Disposition Plan:    Status is: Inpatient  Remains inpatient appropriate because:Inpatient level of care appropriate due to severity of illness   Dispo: The patient is from: Home               Anticipated d/c is to: Home              Anticipated d/c date is: 2 days              Patient currently is not medically stable to d/c.   Difficult to place patient No           Subjective:   C/o of generalized weakness.  No chest pain or shortness of breath.  Objective:    Vitals:   07/21/20 0509 07/21/20 0722 07/21/20 1106 07/21/20 1500  BP: 134/67 132/70 131/62 139/72  Pulse: 73 71 70 71  Resp: '18 16 16 15  '$ Temp: 98 F (36.7 C) 98.8 F (37.1 C) 98.7 F (37.1 C) 98.4 F (36.9 C)  TempSrc: Oral Oral    SpO2: 98% 99% 100% 100%  Weight:      Height:       No data found.   Intake/Output Summary (Last 24 hours) at 07/21/2020 1510 Last data filed at 07/21/2020 1400 Gross per 24 hour  Intake 840 ml  Output 870 ml  Net -30 ml   Filed Weights   07/18/20 0403 07/19/20 0300 07/21/20 0500  Weight: 71.6 kg 73.7 kg 74.6 kg    Exam:  GEN: NAD SKIN: No rash EYES: EOMI ENT: MMM CV: RRR PULM: CTA B ABD: soft, ND, NT, +BS CNS: AAO x 3, non focal EXT: No edema or tenderness   Data Reviewed:   I have personally reviewed following labs and imaging studies:  Labs: Labs show the following:   Basic Metabolic Panel: Recent Labs  Lab 07/16/20 0532 07/17/20 0615 07/18/20 0552 07/19/20 0152 07/20/20 0443 07/21/20 0837  NA 144 143 140 139 140 137  K 3.4* 4.0 4.0 4.4 4.7 4.7  CL 106 106 105 105 106 104  CO2 '29 29 25 23 26 23  '$ GLUCOSE 145* 94 160* 173* 126* 193*  BUN 46* 55* 55* 62* 68* 73*  CREATININE 2.96* 3.41* 3.25* 3.64* 3.85* 4.70*  CALCIUM 8.8* 9.4 8.9 8.9 8.5* 8.6*  MG 1.9  --   --   --   --   --   PHOS  --   --  4.3  --   --   --    GFR Estimated Creatinine Clearance: 16.4 mL/min (A) (by C-G formula based on SCr of 4.7 mg/dL (H)). Liver Function Tests: Recent Labs  Lab 07/15/20 0359  AST 18  ALT 15  ALKPHOS 81  BILITOT 0.5  PROT 6.3*  ALBUMIN 3.0*   No results for input(s): LIPASE, AMYLASE in the last 168 hours. No results for input(s):  AMMONIA in the last 168 hours. Coagulation profile No results for input(s): INR, PROTIME in the last 168 hours.  CBC: Recent Labs  Lab 07/15/20 0359 07/16/20 0532 07/17/20 0615 07/18/20 0552 07/19/20 0152 07/20/20 0443  WBC 6.0 7.8 7.9  8.2 6.6 5.6  NEUTROABS 3.7  --   --   --   --   --   HGB 11.9* 11.7* 12.1* 11.8* 10.9* 9.5*  HCT 34.6* 33.7* 34.8* 34.5* 31.6* 29.1*  MCV 86.9 86.2 86.8 88.0 87.5 89.5  PLT 223 232 248 216 219 200   Cardiac Enzymes: No results for input(s): CKTOTAL, CKMB, CKMBINDEX, TROPONINI in the last 168 hours. BNP (last 3 results) No results for input(s): PROBNP in the last 8760 hours. CBG: Recent Labs  Lab 07/20/20 1413 07/20/20 1652 07/20/20 2055 07/21/20 0723 07/21/20 1106  GLUCAP 212* 152* 227* 156* 290*   D-Dimer: No results for input(s): DDIMER in the last 72 hours. Hgb A1c: No results for input(s): HGBA1C in the last 72 hours. Lipid Profile: No results for input(s): CHOL, HDL, LDLCALC, TRIG, CHOLHDL, LDLDIRECT in the last 72 hours. Thyroid function studies: No results for input(s): TSH, T4TOTAL, T3FREE, THYROIDAB in the last 72 hours.  Invalid input(s): FREET3 Anemia work up: No results for input(s): VITAMINB12, FOLATE, FERRITIN, TIBC, IRON, RETICCTPCT in the last 72 hours. Sepsis Labs: Recent Labs  Lab 07/15/20 0359 07/16/20 0532 07/17/20 0615 07/18/20 0552 07/19/20 0152 07/20/20 0443  WBC 6.0   < > 7.9 8.2 6.6 5.6  LATICACIDVEN 1.2  --   --   --   --   --    < > = values in this interval not displayed.    Microbiology Recent Results (from the past 240 hour(s))  SARS Coronavirus 2 by RT PCR (hospital order, performed in [facility] hospital lab) Nasopharyngeal Nasopharyngeal Swab     Status: None   Collection Time: 07/15/20  3:59 AM   Specimen: Nasopharyngeal Swab  Result Value Ref Range Status   SARS Coronavirus 2 NEGATIVE NEGATIVE Final    Comment: (NOTE) SARS-CoV-2 target nucleic acids are NOT DETECTED.  The  SARS-CoV-2 RNA is generally detectable in upper and lower respiratory specimens during the acute phase of infection. The lowest concentration of SARS-CoV-2 viral copies this assay can detect is 250 copies / mL. A negative result does not preclude SARS-CoV-2 infection and should not be used as the sole basis for treatment or other patient management decisions.  A negative result may occur with improper specimen collection / handling, submission of specimen other than nasopharyngeal swab, presence of viral mutation(s) within the areas targeted by this assay, and inadequate number of viral copies (<250 copies / mL). A negative result must be combined with clinical observations, patient history, and epidemiological information.  Fact Sheet for Patients:   StrictlyIdeas.no  Fact Sheet for Healthcare Providers: BankingDealers.co.za  This test is not yet approved or  cleared by the [place] FDA and has been authorized for detection and/or diagnosis of SARS-CoV-2 by FDA under an Emergency Use Authorization (EUA).  This EUA will remain in effect (meaning this test can be used) for the duration of the COVID-19 declaration under Section 564(b)(1) of the Act, 21 U.S.C. section 360bbb-3(b)(1), unless the authorization is terminated or revoked sooner.  Performed at [facility], [address] [place], [place] [address]   Culture, blood (single)     Status: None   Collection Time: 07/15/20  3:59 AM   Specimen: BLOOD  Result Value Ref Range Status   Specimen Description BLOOD RIGHT FA  Final   Special Requests   Final    BOTTLES DRAWN AEROBIC AND ANAEROBIC Blood Culture results may not be optimal due to an excessive volume of blood received in  culture bottles   Culture   Final    NO GROWTH 5 DAYS Performed at [facility], [address] [place], [place] [address]    Report Status 07/20/2020 FINAL  Final     Procedures and diagnostic studies:  [facility] Chest Port 1 View  Result Date: 07/20/2020 CLINICAL DATA:  Chest pain. EXAM: PORTABLE CHEST 1 VIEW COMPARISON:  07/18/2020. FINDINGS: Borderline cardiomegaly again noted. No pulmonary venous congestion. Low lung volumes again noted. No focal infiltrate. No pleural effusion or pneumothorax. Degenerative change thoracic spine. IMPRESSION: 1. Borderline cardiomegaly again noted. No pulmonary venous congestion. 2. No acute pulmonary disease. Electronically Signed   By: Marcello Moores  Register   On: 07/20/2020 06:26               LOS: 6 days   Jimmy Olson  [facility]   Pager on [email or web address]. If 7PM-7AM, please contact night-coverage at [email or web address]     07/21/2020, 3:10 PM

## 2020-07-22 DIAGNOSIS — F332 Major depressive disorder, recurrent severe without psychotic features: Secondary | ICD-10-CM | POA: Diagnosis not present

## 2020-07-22 DIAGNOSIS — R0789 Other chest pain: Secondary | ICD-10-CM

## 2020-07-22 LAB — BASIC METABOLIC PANEL
Anion gap: 8 (ref 5–15)
BUN: 74 mg/dL — ABNORMAL HIGH (ref 6–20)
CO2: 28 mmol/L (ref 22–32)
Calcium: 8.6 mg/dL — ABNORMAL LOW (ref 8.9–10.3)
Chloride: 106 mmol/L (ref 98–111)
Creatinine, Ser: 4.38 mg/dL — ABNORMAL HIGH (ref 0.61–1.24)
GFR, Estimated: 15 mL/min — ABNORMAL LOW (ref >60)
Glucose, Bld: 200 mg/dL — ABNORMAL HIGH (ref 70–99)
Potassium: 5 mmol/L (ref 3.5–5.1)
Sodium: 142 mmol/L (ref 135–145)

## 2020-07-22 LAB — HEPATITIS B CORE ANTIBODY, TOTAL: Hep B Core Total Ab: NONREACTIVE

## 2020-07-22 LAB — HEPATITIS B SURFACE ANTIGEN: Hepatitis B Surface Ag: NONREACTIVE

## 2020-07-22 LAB — GLUCOSE, CAPILLARY
Glucose-Capillary: 182 mg/dL — ABNORMAL HIGH (ref 70–99)
Glucose-Capillary: 192 mg/dL — ABNORMAL HIGH (ref 70–99)
Glucose-Capillary: 284 mg/dL — ABNORMAL HIGH (ref 70–99)
Glucose-Capillary: 250 mg/dL — ABNORMAL HIGH (ref 70–99)

## 2020-07-22 LAB — HEPATITIS C ANTIBODY: HCV Ab: NONREACTIVE

## 2020-07-22 LAB — HEPATITIS B SURFACE ANTIBODY,QUALITATIVE: Hep B S Ab: NONREACTIVE

## 2020-07-22 MED ORDER — MORPHINE SULFATE (PF) 2 MG/ML IV SOLN
2.0000 mg | INTRAVENOUS | Status: DC | PRN
  Administered 2020-07-22: 2 mg via INTRAVENOUS
  Filled 2020-07-22 (×2): qty 1

## 2020-07-22 MED ORDER — ZOLPIDEM TARTRATE 5 MG PO TABS
10.0000 mg | ORAL_TABLET | Freq: Every evening | ORAL | Status: DC | PRN
  Administered 2020-07-24: 10 mg via ORAL
  Filled 2020-07-22: qty 2

## 2020-07-22 MED ORDER — AMLODIPINE BESYLATE 5 MG PO TABS
2.5000 mg | ORAL_TABLET | Freq: Every day | ORAL | Status: DC
  Administered 2020-07-22 – 2020-07-25 (×4): 2.5 mg via ORAL
  Filled 2020-07-22 (×4): qty 1

## 2020-07-22 MED ORDER — CLONAZEPAM 0.25 MG PO TBDP
0.2500 mg | ORAL_TABLET | Freq: Two times a day (BID) | ORAL | Status: DC
  Administered 2020-07-22 – 2020-07-25 (×5): 0.25 mg via ORAL
  Filled 2020-07-22 (×6): qty 1

## 2020-07-22 MED ORDER — ISOSORBIDE MONONITRATE ER 60 MG PO TB24
60.0000 mg | ORAL_TABLET | Freq: Every day | ORAL | Status: DC
  Administered 2020-07-23 – 2020-07-25 (×3): 60 mg via ORAL
  Filled 2020-07-22 (×4): qty 1

## 2020-07-22 MED ORDER — CLONAZEPAM 0.5 MG PO TABS
0.2500 mg | ORAL_TABLET | Freq: Two times a day (BID) | ORAL | Status: DC
Start: 2020-07-22 — End: 2020-07-22

## 2020-07-22 NOTE — Progress Notes (Signed)
[facility] MD Progress Note  07/22/2020 4:41 PM Jimmy Olson  MRN:  [id number] Subjective: Follow-up for this 60 year old man with depression and multiple evolving medical problems.  Patient was interviewed with the assistance of hospital provided Spanish language interpreter.  Patient is cooperative but is sluggish slow and tired.  Says that he is having chest pain and feels short of breath.  Feels nervous and worried much of the time.  Still down and depressed.  Admits to having suicidal thoughts although he has no plan or intent and says he is cooperative with treatment and will not act on it here in the hospital. Principal Problem: Severe recurrent major depression without psychotic features ([place]) Diagnosis: Principal Problem:   Severe recurrent major depression without psychotic features ([place]) Active Problems:   Essential hypertension   Elevated troponin   Type II diabetes mellitus with renal manifestations (HCC)   Acute on chronic HFrEF (heart failure with reduced ejection fraction) (HCC)   Acute renal failure superimposed on stage 4 chronic kidney disease (HCC)   HLD (hyperlipidemia)   ATN (acute tubular necrosis) ([place])   Demand ischemia (HCC)   Coronary artery disease involving native coronary artery of native heart without angina pectoris  Total Time spent with patient: 30 minutes  Past Psychiatric History: Really none prior to this  Past Medical History:  Past Medical History:  Diagnosis Date  . Cardiomyopathy ([place])    a. 07/2009 MV: EF 48%; b. 08/2019 Echo: EF 45-50%. Global HK. Mod LVH. Gr1 DD. Nl RV fxn. Nl PASP.  Marland Kitchen Chest pain    a. 07/2009 MV: EF 48%. No ischemia.  . Diabetes mellitus without complication ([place])   . HFmrEF (heart failure with mildy reduced ejection fraction) ([place])   . Hypertension     Past Surgical History:  Procedure Laterality Date  . APPENDECTOMY    . RIGHT/LEFT HEART CATH AND CORONARY ANGIOGRAPHY N/A 07/19/2020   Procedure: RIGHT/LEFT HEART CATH AND  CORONARY ANGIOGRAPHY;  Surgeon: Wellington Hampshire, MD;  Location: [facility];  Service: Cardiovascular;  Laterality: N/A;   Family History:  Family History  Problem Relation Age of Onset  . Kidney failure Mother        died @ 2  . Heart failure Mother   . Other Father        he never knew his father   Family Psychiatric  History: Son with schizophrenia Social History:  Social History   Substance and Sexual Activity  Alcohol Use Never     Social History   Substance and Sexual Activity  Drug Use Never    Social History   Socioeconomic History  . Marital status: Married    Spouse name: Not on file  . Number of children: Not on file  . Years of education: Not on file  . Highest education level: Not on file  Occupational History  . Not on file  Tobacco Use  . Smoking status: Never Smoker  . Smokeless tobacco: Never Used  Substance and Sexual Activity  . Alcohol use: Never  . Drug use: Never  . Sexual activity: Not on file  Other Topics Concern  . Not on file  Social History Narrative   Lives locally with wife and son.  He is currently unemployed - has worked in different industries.     Social Determinants of Health   Financial Resource Strain: Not on file  Food Insecurity: Not on file  Transportation Needs: Not on file  Physical Activity: Not  on file  Stress: Not on file  Social Connections: Not on file   Additional Social History:                         Sleep: Poor  Appetite:  Poor  Current Medications: Current Facility-Administered Medications  Medication Dose Route Frequency Provider Last Rate Last Admin  . 0.9 %  sodium chloride infusion  250 mL Intravenous PRN [name] A, MD      . acetaminophen (TYLENOL) tablet 650 mg  650 mg Oral Q6H PRN [name] A, MD   650 mg at 07/19/20 1415  . albuterol (VENTOLIN HFA) 108 (90 Base) MCG/ACT inhaler 2 puff  2 puff Inhalation Q4H PRN Wellington Hampshire, MD      . alum & mag  hydroxide-simeth (MAALOX/MYLANTA) 200-200-20 MG/5ML suspension 30 mL  30 mL Oral Q4H PRN Wellington Hampshire, MD   30 mL at 07/22/20 0906  . amLODipine (NORVASC) tablet 2.5 mg  2.5 mg Oral Daily Theora Gianotti, NP   2.5 mg at 07/22/20 1302  . aspirin EC tablet 81 mg  81 mg Oral Daily [name] A, MD   81 mg at 07/22/20 1300  . atorvastatin (LIPITOR) tablet 80 mg  80 mg Oral Daily Theora Gianotti, NP   80 mg at 07/22/20 1259  . bisoprolol (ZEBETA) tablet 10 mg  10 mg Oral Daily [name] A, MD   10 mg at 07/22/20 1303  . calcium carbonate (TUMS - dosed in mg elemental calcium) chewable tablet 200 mg of elemental calcium  1 tablet Oral TID PRN Wellington Hampshire, MD   200 mg of elemental calcium at 07/16/20 2342  . citalopram (CELEXA) tablet 20 mg  20 mg Oral Daily [name] A, MD   20 mg at 07/22/20 1259  . clonazePAM (KLONOPIN) tablet 0.25 mg  0.25 mg Oral BID Gaines Cartmell T, MD      . dextromethorphan-guaiFENesin (MUCINEX DM) 30-600 MG per 12 hr tablet 1 tablet  1 tablet Oral BID PRN Wellington Hampshire, MD   1 tablet at 07/20/20 0438  . feeding supplement (GLUCERNA SHAKE) (GLUCERNA SHAKE) liquid 237 mL  237 mL Oral BID BM [name] A, MD   237 mL at 07/21/20 1411  . heparin injection 5,000 Units  5,000 Units Subcutaneous Q8H [name] A, MD   5,000 Units at 07/22/20 1314  . hydrALAZINE (APRESOLINE) tablet 100 mg  100 mg Oral BID [name] A, MD   100 mg at 07/22/20 1326  . insulin aspart (novoLOG) injection 0-5 Units  0-5 Units Subcutaneous QHS Wellington Hampshire, MD   2 Units at 07/21/20 2058  . insulin aspart (novoLOG) injection 0-9 Units  0-9 Units Subcutaneous TID WC Wellington Hampshire, MD   2 Units at 07/22/20 1304  . insulin detemir (LEVEMIR) injection 25 Units  25 Units Subcutaneous Daily Fritzi Mandes, MD   25 Units at 07/21/20 [phone]  . [START ON 07/23/2020] isosorbide mononitrate (IMDUR) 24 hr tablet 60 mg  60 mg Oral Daily Theora Gianotti, NP      . morphine 2 MG/ML injection 2 mg  2 mg Intravenous Q2H PRN Athena Masse, MD   2 mg at 07/22/20 0228  . multivitamin with minerals tablet 1 tablet  1 tablet Oral Daily [name] A, MD   1 tablet at 07/22/20 1327  . nitroGLYCERIN (NITROSTAT) SL tablet 0.4 mg  0.4 mg  Sublingual Q5 min PRN Wellington Hampshire, MD   0.4 mg at 07/22/20 0208  . ondansetron (ZOFRAN) injection 4 mg  4 mg Intravenous Q6H PRN Wellington Hampshire, MD   4 mg at 07/16/20 1222  . ondansetron (ZOFRAN) tablet 4 mg  4 mg Oral Q6H PRN Wellington Hampshire, MD   4 mg at 07/20/20 1544  . pantoprazole (PROTONIX) EC tablet 40 mg  40 mg Oral Daily [name] A, MD   40 mg at 07/22/20 1301  . sodium chloride flush (NS) 0.9 % injection 3 mL  3 mL Intravenous Q12H [name] A, MD   3 mL at 07/22/20 1327  . sodium chloride flush (NS) 0.9 % injection 3 mL  3 mL Intravenous PRN Wellington Hampshire, MD      . zolpidem (AMBIEN) tablet 10 mg  10 mg Oral QHS PRN Raghav Verrilli, Madie Reno, MD        Lab Results:  Results for orders placed or performed during the hospital encounter of 07/15/20 (from the past 48 hour(s))  Glucose, capillary     Status: Abnormal   Collection Time: 07/20/20  4:52 PM  Result Value Ref Range   Glucose-Capillary 152 (H) 70 - 99 mg/dL    Comment: Glucose reference range applies only to samples taken after fasting for at least 8 hours.  Glucose, capillary     Status: Abnormal   Collection Time: 07/20/20  8:55 PM  Result Value Ref Range   Glucose-Capillary 227 (H) 70 - 99 mg/dL    Comment: Glucose reference range applies only to samples taken after fasting for at least 8 hours.  Glucose, capillary     Status: Abnormal   Collection Time: 07/21/20  7:23 AM  Result Value Ref Range   Glucose-Capillary 156 (H) 70 - 99 mg/dL    Comment: Glucose reference range applies only to samples taken after fasting for at least 8 hours.  Basic metabolic panel     Status: Abnormal   Collection Time: 07/21/20  8:37  AM  Result Value Ref Range   Sodium 137 135 - 145 mmol/L   Potassium 4.7 3.5 - 5.1 mmol/L   Chloride 104 98 - 111 mmol/L   CO2 23 22 - 32 mmol/L   Glucose, Bld 193 (H) 70 - 99 mg/dL    Comment: Glucose reference range applies only to samples taken after fasting for at least 8 hours.   BUN 73 (H) 6 - 20 mg/dL   Creatinine, Ser 4.70 (H) 0.61 - 1.24 mg/dL   Calcium 8.6 (L) 8.9 - 10.3 mg/dL   GFR, Estimated 14 (L) >60 mL/min    Comment: (NOTE) Calculated using the CKD-EPI Creatinine Equation (2021)    Anion gap 10 5 - 15    Comment: Performed at [facility], [address] [place],  [address]  Glucose, capillary     Status: Abnormal   Collection Time: 07/21/20 11:06 AM  Result Value Ref Range   Glucose-Capillary 290 (H) 70 - 99 mg/dL    Comment: Glucose reference range applies only to samples taken after fasting for at least 8 hours.  Glucose, capillary     Status: Abnormal   Collection Time: 07/21/20  4:40 PM  Result Value Ref Range   Glucose-Capillary 290 (H) 70 - 99 mg/dL    Comment: Glucose reference range applies only to samples taken after fasting for at least 8 hours.  Glucose, capillary     Status: Abnormal  Collection Time: 07/21/20  8:06 PM  Result Value Ref Range   Glucose-Capillary 203 (H) 70 - 99 mg/dL    Comment: Glucose reference range applies only to samples taken after fasting for at least 8 hours.  Basic metabolic panel     Status: Abnormal   Collection Time: 07/22/20  3:50 AM  Result Value Ref Range   Sodium 142 135 - 145 mmol/L   Potassium 5.0 3.5 - 5.1 mmol/L   Chloride 106 98 - 111 mmol/L   CO2 28 22 - 32 mmol/L   Glucose, Bld 200 (H) 70 - 99 mg/dL    Comment: Glucose reference range applies only to samples taken after fasting for at least 8 hours.   BUN 74 (H) 6 - 20 mg/dL   Creatinine, Ser 4.38 (H) 0.61 - 1.24 mg/dL   Calcium 8.6 (L) 8.9 - 10.3 mg/dL   GFR, Estimated 15 (L) >60 mL/min    Comment: (NOTE) Calculated using the CKD-EPI  Creatinine Equation (2021)    Anion gap 8 5 - 15    Comment: Performed at [facility], [address] [place], [place] [address]  Hepatitis B surface antigen     Status: None   Collection Time: 07/22/20  3:50 AM  Result Value Ref Range   Hepatitis B Surface Ag NON REACTIVE NON REACTIVE    Comment: Performed at [facility] [address], [place], [place] [address]  Hepatitis B surface antibody,qualitative     Status: None   Collection Time: 07/22/20  3:50 AM  Result Value Ref Range   Hep B S Ab NON REACTIVE NON REACTIVE    Comment: (NOTE) Inconsistent with immunity, less than 10 mIU/mL.  Performed at [facility], [place] [address], [place], [place] [address]   Hepatitis B core antibody, total     Status: None   Collection Time: 07/22/20  3:50 AM  Result Value Ref Range   Hep B Core Total Ab NON REACTIVE NON REACTIVE    Comment: Performed at [facility] [address], [place], [place] [address]  Hepatitis C antibody     Status: None   Collection Time: 07/22/20  3:50 AM  Result Value Ref Range   HCV Ab NON REACTIVE NON REACTIVE    Comment: (NOTE) Nonreactive HCV antibody screen is consistent with no HCV infections,  unless recent infection is suspected or other evidence exists to indicate HCV infection.  Performed at [facility], [place] [address], [place], [place] [address]   Glucose, capillary     Status: Abnormal   Collection Time: 07/22/20  8:03 AM  Result Value Ref Range   Glucose-Capillary 182 (H) 70 - 99 mg/dL    Comment: Glucose reference range applies only to samples taken after fasting for at least 8 hours.  Glucose, capillary     Status: Abnormal   Collection Time: 07/22/20 11:58 AM  Result Value Ref Range   Glucose-Capillary 192 (H) 70 - 99 mg/dL    Comment: Glucose reference range applies only to samples taken after fasting for at least 8 hours.  Glucose, capillary     Status: Abnormal   Collection Time: 07/22/20  3:48 PM   Result Value Ref Range   Glucose-Capillary 284 (H) 70 - 99 mg/dL    Comment: Glucose reference range applies only to samples taken after fasting for at least 8 hours.    Blood Alcohol level:  No results found for: [employer]  Metabolic Disorder Labs: Lab Results  Component Value Date   HGBA1C 9.7 (H) 07/15/2020   MPG 231.69 07/15/2020   MPG 234.56 09/11/2019   No results found for: PROLACTIN Lab Results  Component Value Date   CHOL 186 07/16/2020   TRIG 178 (H) 07/16/2020   HDL 40 (L) 07/16/2020   CHOLHDL 4.7 07/16/2020   VLDL 36 07/16/2020   LDLCALC 110 (H) 07/16/2020    Physical Findings: AIMS:  , ,  ,  ,    CIWA:    COWS:     Musculoskeletal: Strength & Muscle Tone: decreased Gait & Station: unsteady Patient leans: N/A  Psychiatric Specialty Exam: Physical Exam Vitals and nursing note reviewed.  Constitutional:      Appearance: He is well-developed and well-nourished.  HENT:     Head: Normocephalic and atraumatic.  Eyes:     Conjunctiva/sclera: Conjunctivae normal.     Pupils: Pupils are equal, round, and reactive to light.  Cardiovascular:     Heart sounds: Normal heart sounds.  Pulmonary:     Effort: Pulmonary effort is normal.  Abdominal:     Palpations: Abdomen is soft.  Musculoskeletal:        General: Normal range of motion.     Cervical back: Normal range of motion.  Skin:    General: Skin is warm and dry.  Neurological:     General: No focal deficit present.     Mental Status: He is alert.  Psychiatric:        Attention and Perception: He is inattentive.        Mood and Affect: Affect is blunt.        Speech: Speech is delayed.        Behavior: Behavior is slowed.        Thought Content: Thought content includes suicidal ideation. Thought content does not include suicidal plan.        Cognition and Memory: Cognition is impaired.     Review of Systems  Constitutional: Negative.   HENT: Negative.   Eyes: Negative.   Respiratory: Negative.    Cardiovascular: Positive for chest pain.  Gastrointestinal: Negative.   Musculoskeletal: Negative.   Skin: Negative.   Neurological: Negative.   Psychiatric/Behavioral: Positive for dysphoric mood, sleep disturbance and suicidal ideas. The patient is nervous/anxious.     Blood pressure (!) 160/80, pulse 71, temperature 97.8 F (36.6 C), temperature source Oral, resp. rate 16, height '5\' 5"'$  (1.651 m), weight 74.3 kg, SpO2 97 %.Body mass index is 27.26 kg/m.  General Appearance: Casual  Eye Contact:  Minimal  Speech:  Slow  Volume:  Decreased  Mood:  Anxious and Dysphoric  Affect:  Depressed  Thought Process:  Coherent  Orientation:  Full (Time, Place, and Person)  Thought Content:  Logical  Suicidal Thoughts:  Yes.  without intent/plan  Homicidal Thoughts:  No  Memory:  Immediate;   Fair Recent;   Fair Remote;   Fair  Judgement:  Fair  Insight:  Fair  Psychomotor Activity:  Decreased  Concentration:  Concentration: Poor  Recall:  [employer] of Knowledge:  Fair  Language:  Fair  Akathisia:  No  Handed:  Right  AIMS (if indicated):     Assets:  Desire for Improvement Housing  ADL's:  Impaired  Cognition:  Impaired,  Mild  Sleep:        Treatment Plan Summary: Medication management and Plan Patient is medical problems continue to evolve.  His renal problems have reached  the point of needing dialysis.  Still having chest pain.  Much of that worsened by anxiety.  Spoke with nursing today who confirmed that the patient is endorsing anxiety.  I do not think he needs to have a sitter or suicide watch but I am concerned about his ongoing dysphoria.  He is cooperative with treatment but this level of hopelessness is not consistent with a very good outcome.  He is already on a antidepressant but it is early in the course for that.  I am going to add low-dose clonazepam on a standing basis 0.25 mg twice a day.  I am choosing that rather than as needed to avoid communication problems  getting in the way of treatment.  I will have as needed medicine for sleep in the form of Ambien.  We will continue to follow-up with the patient daily as much as possible  Alethia Berthold, MD 07/22/2020, 4:41 PM

## 2020-07-22 NOTE — Progress Notes (Signed)
Pt is resting in bed with eyes closed, easy to arouse, states chest pain is 2/10 at this time. Pt refuses pain medication at this time and states wants to wait and try the anxiety medication.

## 2020-07-22 NOTE — Progress Notes (Signed)
Patient NPO for perm cath placement. This RN spoke to Dr. Mal Misty and will hold am insulin due to patient having issues with glucose dropping.  Will give PO meds post placement.

## 2020-07-22 NOTE — Progress Notes (Signed)
Pt experiencing c/p & increased work of breathing. BP elevated. EKG showed no changes from prior results. Given nitro x2 with no relief. Provider made aware. Ordered IV morphine. Given x1 with relief. Will continue to monitor.

## 2020-07-22 NOTE — Progress Notes (Addendum)
MD to nurse desk and states pt c/o chest pain and wanting oxygen while he was in the room. RN to the room and pt states has a pain 4/10 mid-sternal pain states it feels like pressure. No distress noted, pt able to talk in complete sentences without SHOB, HR 71 and oxygen 100 on RA at this time, resp even and unlabored rate of 16. Will medicate for pain. After talking with the pt regarding pain medication, the pt states "I want to die" the pt denies wanting to take his life, denies having a plan. The pt states "I have bills to pay and I don't want to have to come back here for the same problem". Will reach out to MD at this time.

## 2020-07-22 NOTE — Progress Notes (Addendum)
Progress Note    Jimmy Olson  [id number] DOB: [birth year]  DOA: 07/15/2020 PCP: [facility] [place]      Brief Narrative:    Medical records reviewed and are as summarized below:  Jimmy Olson is a 60 y.o. male with medical history significant ofsCHF withEF of 45 to 50%, hypertension, hyperlipidemia, diabetes mellitus, COVID-19 infection 4 months ago, CKD 3B, to the hospital with chest pain and chest tightness.  He was admitted to the hospital for acute on chronic systolic and diastolic CHF complicated by acute hypoxic respiratory failure.  He was initially treated with IV heparin infusion because of concern for acute NSTEMI.  He underwent left and right heart cath there was no evidence of critical stenosis.  However NSTEMI was ruled out.  Elevated troponins were attributed to demand ischemia.  He was treated with IV Lasix and oxygen.     Assessment/Plan:   Principal Problem:   Severe recurrent major depression without psychotic features ([place]) Active Problems:   Essential hypertension   Elevated troponin   Type II diabetes mellitus with renal manifestations (HCC)   Acute on chronic HFrEF (heart failure with reduced ejection fraction) (HCC)   Acute renal failure superimposed on stage 4 chronic kidney disease (HCC)   HLD (hyperlipidemia)   ATN (acute tubular necrosis) (HCC)   Demand ischemia (HCC)   Coronary artery disease involving native coronary artery of native heart without angina pectoris   Nutrition Problem: Inadequate oral intake Etiology: decreased appetite  Signs/Symptoms: per patient/family report   Body mass index is 27.26 kg/m.     Acute on chronic systolic and diastolic CHF: Lasix is still on hold because of worsening creatinine.   Acute hypoxic respiratory failure: He is on 2 L/min oxygen via nasal cannula of oxygen to room air.   AKI on CKD stage IV: Creatinine is slightly trending down today.  Initial plan to place  permacath and start hemodialysis today has been aborted.  Monitor creatinine.  Follow-up with nephrologist for further recommendations.  Nonobstructive CAD, elevated troponins: Elevated troponins attributed to demand ischemia.  S/p left and right heart cath which showed moderately severely calcified coronary arteries with moderate two-vessel coronary artery disease involving LAD and right coronary artery.  There was no evidence of critical stenosis. Continue aspirin, Imdur and Lipitor.  Hypertension: Continue metoprolol  Depression: He was evaluated by the psychiatrist on 07/19/2020.  He does not meet criteria for psychiatric inpatient admission.  Continue Celexa.   Diet Order            Diet heart healthy/carb modified Room service appropriate? Yes; Fluid consistency: Thin  Diet effective now                    Consultants:  Cardiologist  Nephrologist  Procedures:  Left and right heart cath on 07/19/2020    Medications:   . amLODipine  2.5 mg Oral Daily  . aspirin EC  81 mg Oral Daily  . atorvastatin  80 mg Oral Daily  . bisoprolol  10 mg Oral Daily  . citalopram  20 mg Oral Daily  . feeding supplement (GLUCERNA SHAKE)  237 mL Oral BID BM  . heparin  5,000 Units Subcutaneous Q8H  . hydrALAZINE  100 mg Oral BID  . insulin aspart  0-5 Units Subcutaneous QHS  . insulin aspart  0-9 Units Subcutaneous TID WC  . insulin detemir  25 Units Subcutaneous Daily  . [START ON 07/23/2020] isosorbide  mononitrate  60 mg Oral Daily  . multivitamin with minerals  1 tablet Oral Daily  . pantoprazole  40 mg Oral Daily  . sodium chloride flush  3 mL Intravenous Q12H   Continuous Infusions: . sodium chloride       Anti-infectives (From admission, onward)   None             Family Communication/Anticipated D/C date and plan/Code Status   DVT prophylaxis: heparin injection 5,000 Units Start: 07/19/20 2200     Code Status: Full Code  Family Communication:  None Disposition Plan:    Status is: Inpatient  Remains inpatient appropriate because:Inpatient level of care appropriate due to severity of illness   Dispo: The patient is from: Home              Anticipated d/c is to: Home              Anticipated d/c date is: 1 day              Patient currently is not medically stable to d/c.   Difficult to place patient No           Subjective:   He said he had chest pain and shortness of breath last night but he feels better now.  Objective:    Vitals:   07/21/20 2007 07/22/20 0213 07/22/20 0429 07/22/20 1134  BP: 137/71 (!) 163/77 130/64 (!) 159/79  Pulse: 70 74 71 66  Resp: 16  18   Temp: 98.2 F (36.8 C)  98.2 F (36.8 C) 98.2 F (36.8 C)  TempSrc:   Oral Oral  SpO2: 100% 100% 100% 97%  Weight:   74.3 kg   Height:       No data found.   Intake/Output Summary (Last 24 hours) at 07/22/2020 1241 Last data filed at 07/21/2020 2100 Gross per 24 hour  Intake 240 ml  Output 830 ml  Net -590 ml   Filed Weights   07/19/20 0300 07/21/20 0500 07/22/20 0429  Weight: 73.7 kg 74.6 kg 74.3 kg    Exam:  GEN: NAD SKIN: No rash EYES: EOMI ENT: MMM CV: RRR PULM: CTA B ABD: soft, ND, NT, +BS CNS: AAO x 3, non focal EXT: No edema or tenderness   Data Reviewed:   I have personally reviewed following labs and imaging studies:  Labs: Labs show the following:   Basic Metabolic Panel: Recent Labs  Lab 07/16/20 0532 07/17/20 0615 07/18/20 0552 07/19/20 0152 07/20/20 0443 07/21/20 0837 07/22/20 0350  NA 144   < > 140 139 140 137 142  K 3.4*   < > 4.0 4.4 4.7 4.7 5.0  CL 106   < > 105 105 106 104 106  CO2 29   < > '25 23 26 23 28  '$ GLUCOSE 145*   < > 160* 173* 126* 193* 200*  BUN 46*   < > 55* 62* 68* 73* 74*  CREATININE 2.96*   < > 3.25* 3.64* 3.85* 4.70* 4.38*  CALCIUM 8.8*   < > 8.9 8.9 8.5* 8.6* 8.6*  MG 1.9  --   --   --   --   --   --   PHOS  --   --  4.3  --   --   --   --    < > = values in this  interval not displayed.   GFR Estimated Creatinine Clearance: 17.5 mL/min (A) (by C-G formula based on SCr of  4.38 mg/dL (H)). Liver Function Tests: No results for input(s): AST, ALT, ALKPHOS, BILITOT, PROT, ALBUMIN in the last 168 hours. No results for input(s): LIPASE, AMYLASE in the last 168 hours. No results for input(s): AMMONIA in the last 168 hours. Coagulation profile No results for input(s): INR, PROTIME in the last 168 hours.  CBC: Recent Labs  Lab 07/16/20 0532 07/17/20 0615 07/18/20 0552 07/19/20 0152 07/20/20 0443  WBC 7.8 7.9 8.2 6.6 5.6  HGB 11.7* 12.1* 11.8* 10.9* 9.5*  HCT 33.7* 34.8* 34.5* 31.6* 29.1*  MCV 86.2 86.8 88.0 87.5 89.5  PLT 232 248 216 219 200   Cardiac Enzymes: No results for input(s): CKTOTAL, CKMB, CKMBINDEX, TROPONINI in the last 168 hours. BNP (last 3 results) No results for input(s): PROBNP in the last 8760 hours. CBG: Recent Labs  Lab 07/21/20 1106 07/21/20 1640 07/21/20 2006 07/22/20 0803 07/22/20 1158  GLUCAP 290* 290* 203* 182* 192*   D-Dimer: No results for input(s): DDIMER in the last 72 hours. Hgb A1c: No results for input(s): HGBA1C in the last 72 hours. Lipid Profile: No results for input(s): CHOL, HDL, LDLCALC, TRIG, CHOLHDL, LDLDIRECT in the last 72 hours. Thyroid function studies: No results for input(s): TSH, T4TOTAL, T3FREE, THYROIDAB in the last 72 hours.  Invalid input(s): FREET3 Anemia work up: No results for input(s): VITAMINB12, FOLATE, FERRITIN, TIBC, IRON, RETICCTPCT in the last 72 hours. Sepsis Labs: Recent Labs  Lab 07/17/20 0615 07/18/20 0552 07/19/20 0152 07/20/20 0443  WBC 7.9 8.2 6.6 5.6    Microbiology Recent Results (from the past 240 hour(s))  SARS Coronavirus 2 by RT PCR (hospital order, performed in [facility] hospital lab) Nasopharyngeal Nasopharyngeal Swab     Status: None   Collection Time: 07/15/20  3:59 AM   Specimen: Nasopharyngeal Swab  Result Value Ref Range Status   SARS  Coronavirus 2 NEGATIVE NEGATIVE Final    Comment: (NOTE) SARS-CoV-2 target nucleic acids are NOT DETECTED.  The SARS-CoV-2 RNA is generally detectable in upper and lower respiratory specimens during the acute phase of infection. The lowest concentration of SARS-CoV-2 viral copies this assay can detect is 250 copies / mL. A negative result does not preclude SARS-CoV-2 infection and should not be used as the sole basis for treatment or other patient management decisions.  A negative result may occur with improper specimen collection / handling, submission of specimen other than nasopharyngeal swab, presence of viral mutation(s) within the areas targeted by this assay, and inadequate number of viral copies (<250 copies / mL). A negative result must be combined with clinical observations, patient history, and epidemiological information.  Fact Sheet for Patients:   StrictlyIdeas.no  Fact Sheet for Healthcare Providers: BankingDealers.co.za  This test is not yet approved or  cleared by the [place] FDA and has been authorized for detection and/or diagnosis of SARS-CoV-2 by FDA under an Emergency Use Authorization (EUA).  This EUA will remain in effect (meaning this test can be used) for the duration of the COVID-19 declaration under Section 564(b)(1) of the Act, 21 U.S.C. section 360bbb-3(b)(1), unless the authorization is terminated or revoked sooner.  Performed at [facility], [address] [place], [place] [address]   Culture, blood (single)     Status: None   Collection Time: 07/15/20  3:59 AM   Specimen: BLOOD  Result Value Ref Range Status   Specimen Description BLOOD RIGHT FA  Final   Special Requests   Final    BOTTLES DRAWN AEROBIC AND ANAEROBIC Blood  Culture results may not be optimal due to an excessive volume of blood received in culture bottles   Culture   Final    NO GROWTH 5 DAYS Performed at [facility], [address] [place], [place] [address]    Report Status 07/20/2020 FINAL  Final    Procedures and diagnostic studies:  No results found.             LOS: 7 days   Caylyn Tedeschi  [facility]   Pager on [email or web address]. If 7PM-7AM, please contact night-coverage at [email or web address]     07/22/2020, 12:41 PM

## 2020-07-22 NOTE — Progress Notes (Signed)
Progress Note  Patient Name: Jimmy Olson Date of Encounter: 07/22/2020  Primary Cardiologist: Ida Rogue, MD  Subjective   Jimmy Olson is a 60 y/o male who is sitting up in bed with c/o chest discomfort that occurred earlier this morning. After receiving NTG and morphine inj, he says pain improved, but reports ongoing retrosternal chest wall tenderness/discomfort. He nods his head when asked about pain and motions with his hand to cover his abdomen and chest.   Inpatient Medications    Scheduled Meds: . aspirin EC  81 mg Oral Daily  . atorvastatin  80 mg Oral Daily  . bisoprolol  10 mg Oral Daily  . citalopram  20 mg Oral Daily  . feeding supplement (GLUCERNA SHAKE)  237 mL Oral BID BM  . heparin  5,000 Units Subcutaneous Q8H  . hydrALAZINE  100 mg Oral BID  . insulin aspart  0-5 Units Subcutaneous QHS  . insulin aspart  0-9 Units Subcutaneous TID WC  . insulin detemir  25 Units Subcutaneous Daily  . isosorbide mononitrate  120 mg Oral Daily  . multivitamin with minerals  1 tablet Oral Daily  . pantoprazole  40 mg Oral Daily  . sodium chloride flush  3 mL Intravenous Q12H   Continuous Infusions: . sodium chloride      Vital Signs    Vitals:   07/21/20 1500 07/21/20 2007 07/22/20 0213 07/22/20 0429  BP: 139/72 137/71 (!) 163/77 130/64  Pulse: 71 70 74 71  Resp: '15 16  18  '$ Temp: 98.4 F (36.9 C) 98.2 F (36.8 C)  98.2 F (36.8 C)  TempSrc:    Oral  SpO2: 100% 100% 100% 100%  Weight:    74.3 kg  Height:        Intake/Output Summary (Last 24 hours) at 07/22/2020 0841 Last data filed at 07/21/2020 2100 Gross per 24 hour  Intake 480 ml  Output 830 ml  Net -350 ml   Filed Weights   07/19/20 0300 07/21/20 0500 07/22/20 0429  Weight: 73.7 kg 74.6 kg 74.3 kg    Physical Exam   GEN: Well nourished, well developed, in no acute distress.  HEENT: Grossly normal. Neck: Supple, no JVD, carotid bruits, or masses. Cardiac: RRR, no murmurs, rubs, or gallops. No  clubbing, cyanosis, edema.  Radials 2+, DP/PT 2+ and equal bilaterally. Bil upper chest tender to palpation.  Respiratory:  Respirations regular and unlabored, clear to auscultation bilaterally. Abdomen: Soft, nondistended, BS + x 4. Grimacing with mild palpation.  MS: no deformity or atrophy. No edema.  Skin: warm and dry, no rash. Neuro:  Strength and sensation are intact. Psych: AAOx3.  Anxious.   Labs    Chemistry Recent Labs  Lab 07/20/20 0443 07/21/20 0837 07/22/20 0350  NA 140 137 142  K 4.7 4.7 5.0  CL 106 104 106  CO2 '26 23 28  '$ GLUCOSE 126* 193* 200*  BUN 68* 73* 74*  CREATININE 3.85* 4.70* 4.38*  CALCIUM 8.5* 8.6* 8.6*  GFRNONAA 17* 14* 15*  ANIONGAP '8 10 8     '$ Hematology Recent Labs  Lab 07/18/20 0552 07/19/20 0152 07/20/20 0443  WBC 8.2 6.6 5.6  RBC 3.92* 3.61* 3.25*  HGB 11.8* 10.9* 9.5*  HCT 34.5* 31.6* 29.1*  MCV 88.0 87.5 89.5  MCH 30.1 30.2 29.2  MCHC 34.2 34.5 32.6  RDW 12.0 12.1 12.0  PLT 216 219 200    Cardiac Enzymes  Recent Labs  Lab 07/15/20 1124 07/15/20 1943 07/15/20 2240  07/17/20 1026 07/20/20 0443  TROPONINIHS 176* 190* 200* 133* 94*      BNP Recent Labs  Lab 07/18/20 0552 07/20/20 0443  BNP 418.7* 584.1*     Lipids  Lab Results  Component Value Date   CHOL 186 07/16/2020   HDL 40 (L) 07/16/2020   LDLCALC 110 (H) 07/16/2020   TRIG 178 (H) 07/16/2020   CHOLHDL 4.7 07/16/2020    HbA1c  Lab Results  Component Value Date   HGBA1C 9.7 (H) 07/15/2020    Radiology    DG Chest 1 View  Result Date: 07/18/2020 CLINICAL DATA:  In patient follow-up of acute tubular necrosis. EXAM: CHEST  1 VIEW COMPARISON:  Yesterday FINDINGS: Borderline heart size. Unremarkable aortic and hilar contours. There is no edema, consolidation, effusion, or pneumothorax. IMPRESSION: Low volume but clear lungs. Electronically Signed   By: Monte Fantasia M.D.   On: 07/18/2020 11:35    DG Chest Port 1 View  Result Date: 07/20/2020 CLINICAL  DATA:  Chest pain. EXAM: PORTABLE CHEST 1 VIEW COMPARISON:  07/18/2020. FINDINGS: Borderline cardiomegaly again noted. No pulmonary venous congestion. Low lung volumes again noted. No focal infiltrate. No pleural effusion or pneumothorax. Degenerative change thoracic spine. IMPRESSION: 1. Borderline cardiomegaly again noted. No pulmonary venous congestion. 2. No acute pulmonary disease. Electronically Signed   By: Marcello Moores  Register   On: 07/20/2020 06:26    Telemetry    RSR @ rate 65-75. Personally Reviewed   Cardiac Studies   ECHO 1.27.22  1. Left ventricular ejection fraction, by estimation, is 30 to 35%. The  left ventricle has moderately decreased function. The left ventricle  demonstrates global hypokinesis. The left ventricular internal cavity size  was moderately dilated. There is mild  left ventricular hypertrophy. Left ventricular diastolic parameters are  consistent with Grade II diastolic dysfunction (pseudonormalization). The  average left ventricular global longitudinal strain is -10.1 %. The global  longitudinal strain is abnormal.  2. Right ventricular systolic function is normal. The right ventricular  size is normal. Tricuspid regurgitation signal is inadequate for assessing  PA pressure.  3. Left atrial size was mildly dilated.  4. The inferior vena cava is normal in size with greater than 50%  respiratory variability, suggesting right atrial pressure of 3 mmHg.   R/L Heart Cath 1.31.22   Prox RCA lesion is 20% stenosed.  Mid RCA lesion is 30% stenosed.  Dist RCA lesion is 50% stenosed with 50% stenosed side branch in RPAV.  Prox LAD lesion is 40% stenosed.  1st Diag lesion is 70% stenosed.  Mid LAD lesion is 60% stenosed.   1.  Moderately severely calcified coronary arteries with moderate two-vessel coronary artery disease involving the LAD and right coronary artery.  No evidence of critical stenosis. 2.  Right heart catheterization showed mildly  elevated filling pressures, mild pulmonary hypertension and normal cardiac output. 3.  Left ventricular angiography was not performed due to chronic kidney disease.  EF was moderately reduced by echo.  Recommendations: Recommend aggressive medical therapy for nonobstructive coronary artery disease.  Cardiomyopathy seems to be out of proportion to coronary artery disease.   Patient Profile   Jimmy Olson is a 60 year old man with history of chronic HFrEF, poorly controlled hypertension, hyperlipidemia, DM 2, CKD stage IV, and COVID-19 infection in 02/2020, who presented for acute HFrEF and elevated troponin on 07/15/20. Catheterization 1/31showed nonobstructive CAD consistent with nonischemic cardiomyopathy. His left heart filling pressures were only mildly elevated.    Assessment & Plan  1. Acute on chronic combined systolic and diastolic HF/NICM: Admitted 1/27 w/progressive angina, hypertensive urgency, AKI, volume overload, and NSTEMI. Echo w/EF 30-35%, G2DD. Cath 1/31 showed mildly elev filling pressures. Volume status euvolemic. He is NPO today for permanent catheter placement for HD access. He reports persistent chest pain and motions to abdomen and upper chest - likely noncardiac.  Continue  blocker, hydralazine, nitrate. No ACEI/ARB/ARNI/MRA in setting of acute CKD IV.  2. Unstable Angina/Demand Ischemia/non-obstructive CAD: Patient appears anxious and indicates that he has pain over entire upper chest and abdomen. Unclear etiology as  Nonobstructive CAD on cath.  C/p is worse w/ palpation, thus cardiac origin of ongoing pain is unlikely.  Currently on Imdur 120 mg, complaining of headache. Will decrease Imdur to 60 mg and start amlodipine 2.5 mg for additional anti-anginal effect.   3. Hyperlipidemia. LDL 110, goal < 70. Continue atorvastatin and aspirin for GDMT to prevent progression. Recheck lipid levels in 8 weeks with consideration of zetia +/- PCSK9i if not at goal.   4. CKD IV:  Lasix remains on hold. Creatinine trending down at 4.38 today. K+ @ 5. No indication for HD per nephrology at this time. Continue to avoid nephrotoxic agents. For perm cath placement today.  5. Essential HTN: BP improved on hydralazine, bisoprolol, Imdur. No ACEI/ARB/ARNI/MRA in the setting of CKD. Reports headache, may be due to high dose of Imdur. Will decrease Imdur to 60 mg QD and start amlodipine 2.5 mg.  6. Normocytic Anemia: H/H trending down. Currently on ASA 81 mg, no role P2Y12. Continue to follow.   Signed, Murray Hodgkins, NP  07/22/2020, 8:41 AM    For questions or updates, please contact   Please consult [email or web address] for contact info under Cardiology/STEMI.

## 2020-07-22 NOTE — Progress Notes (Signed)
[facility], [name] 07/22/20  Subjective:   Hospital day # 7  States he has fatigue Currently on room air No distress in breathing Able to void without any problems Serum creatinine trend slightly improved Renal: 02/02 0701 - 02/03 0700 In: 480 [P.O.:480] Out: 830 [Urine:830] Lab Results  Component Value Date   CREATININE 4.38 (H) 07/22/2020   CREATININE 4.70 (H) 07/21/2020   CREATININE 3.85 (H) 07/20/2020     Objective:  Vital signs in last 24 hours:  Temp:  [98.2 F (36.8 C)-98.4 F (36.9 C)] 98.2 F (36.8 C) (02/03 0429) Pulse Rate:  [70-74] 71 (02/03 0429) Resp:  [15-18] 18 (02/03 0429) BP: (130-163)/(64-77) 130/64 (02/03 0429) SpO2:  [100 %] 100 % (02/03 0429) Weight:  [74.3 kg] 74.3 kg (02/03 0429)  Weight change: -0.3 kg Filed Weights   07/19/20 0300 07/21/20 0500 07/22/20 0429  Weight: 73.7 kg 74.6 kg 74.3 kg    Intake/Output:    Intake/Output Summary (Last 24 hours) at 07/22/2020 1110 Last data filed at 07/21/2020 2100 Gross per 24 hour  Intake 240 ml  Output 830 ml  Net -590 ml    Physical Exam: General:  No acute distress, laying in the bed  HEENT  anicteric, moist oral mucous membrane  Pulm/lungs  normal breathing effort, lungs are clear to auscultation  CVS/Heart  regular rhythm, no rub or gallop  Abdomen:   Soft, tender  Extremities:  No peripheral edema  Neurologic:  Alert, oriented, able to follow commands  Skin:  No acute rashes     Basic Metabolic Panel:  Recent Labs  Lab 07/16/20 0532 07/17/20 0615 07/18/20 0552 07/19/20 0152 07/20/20 0443 07/21/20 0837 07/22/20 0350  NA 144   < > 140 139 140 137 142  K 3.4*   < > 4.0 4.4 4.7 4.7 5.0  CL 106   < > 105 105 106 104 106  CO2 29   < > '25 23 26 23 28  '$ GLUCOSE 145*   < > 160* 173* 126* 193* 200*  BUN 46*   < > 55* 62* 68* 73* 74*  CREATININE 2.96*   < > 3.25* 3.64* 3.85* 4.70* 4.38*  CALCIUM 8.8*   < > 8.9 8.9 8.5* 8.6* 8.6*  MG 1.9  --   --   --    --   --   --   PHOS  --   --  4.3  --   --   --   --    < > = values in this interval not displayed.     CBC: Recent Labs  Lab 07/16/20 0532 07/17/20 0615 07/18/20 0552 07/19/20 0152 07/20/20 0443  WBC 7.8 7.9 8.2 6.6 5.6  HGB 11.7* 12.1* 11.8* 10.9* 9.5*  HCT 33.7* 34.8* 34.5* 31.6* 29.1*  MCV 86.2 86.8 88.0 87.5 89.5  PLT 232 248 216 219 200     No results found for: HEPBSAG, HEPBSAB, HEPBIGM    Microbiology:  Recent Results (from the past 240 hour(s))  SARS Coronavirus 2 by RT PCR (hospital order, performed in [facility] hospital lab) Nasopharyngeal Nasopharyngeal Swab     Status: None   Collection Time: 07/15/20  3:59 AM   Specimen: Nasopharyngeal Swab  Result Value Ref Range Status   SARS Coronavirus 2 NEGATIVE NEGATIVE Final    Comment: (NOTE) SARS-CoV-2 target nucleic acids are NOT DETECTED.  The SARS-CoV-2 RNA is generally detectable in upper and lower respiratory specimens during the acute phase of  infection. The lowest concentration of SARS-CoV-2 viral copies this assay can detect is 250 copies / mL. A negative result does not preclude SARS-CoV-2 infection and should not be used as the sole basis for treatment or other patient management decisions.  A negative result may occur with improper specimen collection / handling, submission of specimen other than nasopharyngeal swab, presence of viral mutation(s) within the areas targeted by this assay, and inadequate number of viral copies (<250 copies / mL). A negative result must be combined with clinical observations, patient history, and epidemiological information.  Fact Sheet for Patients:   StrictlyIdeas.no  Fact Sheet for Healthcare Providers: BankingDealers.co.za  This test is not yet approved or  cleared by the [place] FDA and has been authorized for detection and/or diagnosis of SARS-CoV-2 by FDA under an Emergency Use Authorization (EUA).   This EUA will remain in effect (meaning this test can be used) for the duration of the COVID-19 declaration under Section 564(b)(1) of the Act, 21 U.S.C. section 360bbb-3(b)(1), unless the authorization is terminated or revoked sooner.  Performed at [facility], [address] [place], [place] [address]   Culture, blood (single)     Status: None   Collection Time: 07/15/20  3:59 AM   Specimen: BLOOD  Result Value Ref Range Status   Specimen Description BLOOD RIGHT FA  Final   Special Requests   Final    BOTTLES DRAWN AEROBIC AND ANAEROBIC Blood Culture results may not be optimal due to an excessive volume of blood received in culture bottles   Culture   Final    NO GROWTH 5 DAYS Performed at [facility], [address] [place], [place] [address]    Report Status 07/20/2020 FINAL  Final    Coagulation Studies: No results for input(s): LABPROT, INR in the last 72 hours.  Urinalysis: No results for input(s): COLORURINE, LABSPEC, PHURINE, GLUCOSEU, HGBUR, BILIRUBINUR, KETONESUR, PROTEINUR, UROBILINOGEN, NITRITE, LEUKOCYTESUR in the last 72 hours.  Invalid input(s): APPERANCEUR    Imaging: No results found.   Medications:   . sodium chloride     . amLODipine  2.5 mg Oral Daily  . aspirin EC  81 mg Oral Daily  . atorvastatin  80 mg Oral Daily  . bisoprolol  10 mg Oral Daily  . citalopram  20 mg Oral Daily  . feeding supplement (GLUCERNA SHAKE)  237 mL Oral BID BM  . heparin  5,000 Units Subcutaneous Q8H  . hydrALAZINE  100 mg Oral BID  . insulin aspart  0-5 Units Subcutaneous QHS  . insulin aspart  0-9 Units Subcutaneous TID WC  . insulin detemir  25 Units Subcutaneous Daily  . [START ON 07/23/2020] isosorbide mononitrate  60 mg Oral Daily  . multivitamin with minerals  1 tablet Oral Daily  . pantoprazole  40 mg Oral Daily  . sodium chloride flush  3 mL Intravenous Q12H   sodium chloride, acetaminophen, albuterol, alum & mag hydroxide-simeth,  calcium carbonate, dextromethorphan-guaiFENesin, morphine injection, nitroGLYCERIN, ondansetron (ZOFRAN) IV, ondansetron, sodium chloride flush  Assessment/ Plan:  60 y.o. male with diabetes, hypertension, CHF  admitted on 07/15/2020 for SOB (shortness of breath) [R06.02] Elevated troponin [R77.8] Acute CHF (congestive heart failure) (HCC) [I50.9] Acute on chronic systolic congestive heart failure (HCC) [I50.23] Acute on chronic systolic CHF (congestive heart failure) ([place]) [I50.23]  07/16/2020: 2D echo: LVEF 30 to 35%, global hypokinesis, moderately dilated left ventricular internal cavity, grade 2 diastolic dysfunction  #Acute kidney injury Likely secondary to ATN/Cardiorenal syndrome  02/02 0701 - 02/03 0700 In: 480 [P.O.:480] Out: 27 [Urine:830] Lab Results  Component Value Date   CREATININE 4.38 (H) 07/22/2020   CREATININE 4.70 (H) 07/21/2020   CREATININE 3.85 (H) 07/20/2020  Slight improvement with creatinine.  Producing clear, yellow urine, UOP 1000cc Continue to monitor renal function closely No acute indication for dialysis at present  #Chronic kidney disease stage IV Baseline creatinine 2.96/GFR 24 from July 16, 2020 CKD likely secondary to diabetes, hypertension and atherosclerosis  #Non-STEMI Status post cardiac cath which shows moderate to severe calcified coronary arteries and two-vessel coronary disease involving LAD and RCA. Medical management recommended. 40 mL IV contrast used  #Type 2 diabetes with CKD Lab Results  Component Value Date   HGBA1C 9.7 (H) 07/15/2020  Obtain urine microalbumin creatinine ratio Statin-Crestor Was on Zestril at home 30 mg daily. We will be able to restart once creatinine stabilizes.    LOS: [place] 2/3/202211:10 AM  [place], [place]  Note: This note was prepared with Dragon dictation. Any transcription errors are unintentional

## 2020-07-23 DIAGNOSIS — N171 Acute kidney failure with acute cortical necrosis: Secondary | ICD-10-CM

## 2020-07-23 LAB — BASIC METABOLIC PANEL
Anion gap: 10 (ref 5–15)
BUN: 70 mg/dL — ABNORMAL HIGH (ref 6–20)
CO2: 25 mmol/L (ref 22–32)
Calcium: 8.8 mg/dL — ABNORMAL LOW (ref 8.9–10.3)
Chloride: 104 mmol/L (ref 98–111)
Creatinine, Ser: 4.23 mg/dL — ABNORMAL HIGH (ref 0.61–1.24)
GFR, Estimated: 16 mL/min — ABNORMAL LOW (ref >60)
Glucose, Bld: 162 mg/dL — ABNORMAL HIGH (ref 70–99)
Potassium: 5 mmol/L (ref 3.5–5.1)
Sodium: 139 mmol/L (ref 135–145)

## 2020-07-23 LAB — MICROALBUMIN / CREATININE URINE RATIO
Creatinine, Urine: 67.7 mg/dL
Microalb Creat Ratio: 2485 mg/g creat — ABNORMAL HIGH (ref 0–29)
Microalb, Ur: 1682.3 ug/mL — ABNORMAL HIGH

## 2020-07-23 LAB — GLUCOSE, CAPILLARY
Glucose-Capillary: 165 mg/dL — ABNORMAL HIGH (ref 70–99)
Glucose-Capillary: 175 mg/dL — ABNORMAL HIGH (ref 70–99)
Glucose-Capillary: 311 mg/dL — ABNORMAL HIGH (ref 70–99)
Glucose-Capillary: 180 mg/dL — ABNORMAL HIGH (ref 70–99)
Glucose-Capillary: 178 mg/dL — ABNORMAL HIGH (ref 70–99)

## 2020-07-23 NOTE — Progress Notes (Signed)
Inpatient Diabetes Program Recommendations  AACE/ADA: New Consensus Statement on Inpatient Glycemic Control (2015)  Target Ranges:  Prepandial:   less than 140 mg/dL      Peak postprandial:   less than 180 mg/dL (1-2 hours)      Critically ill patients:  140 - 180 mg/dL   Lab Results  Component Value Date   GLUCAP 311 (H) 07/23/2020   HGBA1C 9.7 (H) 07/15/2020    Review of Glycemic Control Results for Jimmy Olson, Jimmy Olson (MRN [id number]) as of 07/23/2020 12:04  Ref. Range 07/22/2020 08:03 07/22/2020 11:58 07/22/2020 15:48 07/22/2020 19:36 07/23/2020 07:20 07/23/2020 08:00 07/23/2020 11:02  Glucose-Capillary Latest Ref Range: 70 - 99 mg/dL 182 (H) 192 (H) 284 (H) 250 (H) 165 (H) 175 (H) 311 (H)   Home DM Meds: Levemir 45 units BID Humalog 5 units TID with meals   Current Orders: Levemir 25 units Daily                             Novolog 0-9 TID ac + hs  Inpatient Diabetes Program Recommendations:   Consider adding Novolog 4 units meal coverage if eats 50% & CBG >80  Thank you, Bethena Roys E. Lory Galan, RN, MSN, CDE  Diabetes Coordinator Inpatient Glycemic Control Team Team Pager [phone] (8am-5pm) 07/23/2020 12:07 PM

## 2020-07-23 NOTE — Progress Notes (Signed)
Mobility Specialist - Progress Note   07/23/20 1200  Mobility  Activity Ambulated in hall  Level of Assistance Independent  Assistive Device None  Distance Ambulated (ft)  (370)  Mobility Response Tolerated well  Mobility performed by Mobility specialist  $Mobility charge 1 Mobility    Pre-mobility: 68 HR, 99% SpO2 During mobility: 74 HR, 97% SpO2 Post-mobility: 70 HR, 98% SpO2   Pt was lying in bed upon arrival utilizing room air. Pt agreed to session. Pt denied nausea and fatigue, but does c/o pain in kidneys "3/10". Pt denied chest pain. Pt independent in all transfers this date, including ambulation. CGA donned for safety. Pt ambulated 370' in room/hallway without use of AD. No LOB noted. Pt denied dizziness, weakness in LE, and SOB. O2 maintained high 90s throughout session. Upon return to room, pt denied exhaustion and fatigue. Pt did rate RPE for activity a "3/10". Overall, pt tolerated session well. Pt was left EOB with all needs in reach. Nurse notified.    Kathee Delton Mobility Specialist 07/23/20, 12:42 PM

## 2020-07-23 NOTE — Consult Note (Signed)
[facility] Consult   Reason for Consult: Follow-up consult for this 60 year old man with anxiety and depression Referring Physician:  Mal Misty Patient Identification: MUSA SORICH MRN:  [id number] Principal Diagnosis: Severe recurrent major depression without psychotic features ([place]) Diagnosis:  Principal Problem:   Severe recurrent major depression without psychotic features ([place]) Active Problems:   Essential hypertension   Elevated troponin   Type II diabetes mellitus with renal manifestations ([place])   Acute on chronic HFrEF (heart failure with reduced ejection fraction) ([place])   Acute renal failure superimposed on stage 4 chronic kidney disease ([place])   HLD (hyperlipidemia)   ATN (acute tubular necrosis) ([place])   Demand ischemia ([place])   Coronary artery disease involving native coronary artery of native heart without angina pectoris   Total Time spent with patient: 20 minutes  Subjective:   Jimmy Olson is a 59 y.o. male patient admitted with "I am doing better".  HPI: Patient seen for follow-up.  Seen with a Spanish language interpreter.  He reports that his mood is better.  No longer entertaining any suicidal thought.  Anxiety is under better control.  Seems to be tolerating the addition of the low-dose clonazepam.  He is keeping abreast of his treatment and knows that he is not getting dialyzed today and there is hope for kidney recovery  Past Psychiatric History: No previous treatment  Risk to Self:   Risk to Others:   Prior Inpatient Therapy:   Prior Outpatient Therapy:    Past Medical History:  Past Medical History:  Diagnosis Date  . Cardiomyopathy ([place])    a. 07/2009 MV: EF 48%; b. 08/2019 Echo: EF 45-50%. Global HK. Mod LVH. Gr1 DD. Nl RV fxn. Nl PASP.  Marland Kitchen Chest pain    a. 07/2009 MV: EF 48%. No ischemia.  . Diabetes mellitus without complication ([place])   . HFmrEF (heart failure with mildy reduced ejection fraction) ([place])   . Hypertension     Past  Surgical History:  Procedure Laterality Date  . APPENDECTOMY    . RIGHT/LEFT HEART CATH AND CORONARY ANGIOGRAPHY N/A 07/19/2020   Procedure: RIGHT/LEFT HEART CATH AND CORONARY ANGIOGRAPHY;  Surgeon: Wellington Hampshire, MD;  Location: [facility];  Service: Cardiovascular;  Laterality: N/A;   Family History:  Family History  Problem Relation Age of Onset  . Kidney failure Mother        died @ 1  . Heart failure Mother   . Other Father        he never knew his father   Family Psychiatric  History: Son with schizophrenia Social History:  Social History   Substance and Sexual Activity  Alcohol Use Never     Social History   Substance and Sexual Activity  Drug Use Never    Social History   Socioeconomic History  . Marital status: Married    Spouse name: Not on file  . Number of children: Not on file  . Years of education: Not on file  . Highest education level: Not on file  Occupational History  . Not on file  Tobacco Use  . Smoking status: Never Smoker  . Smokeless tobacco: Never Used  Substance and Sexual Activity  . Alcohol use: Never  . Drug use: Never  . Sexual activity: Not on file  Other Topics Concern  . Not on file  Social History Narrative   Lives locally with wife and son.  He is currently unemployed - has worked in different industries.  Social Determinants of Health   Financial Resource Strain: Not on file  Food Insecurity: Not on file  Transportation Needs: Not on file  Physical Activity: Not on file  Stress: Not on file  Social Connections: Not on file   Additional Social History:    Allergies:  No Known Allergies  Labs:  Results for orders placed or performed during the hospital encounter of 07/15/20 (from the past 48 hour(s))  Glucose, capillary     Status: Abnormal   Collection Time: 07/21/20  8:06 PM  Result Value Ref Range   Glucose-Capillary 203 (H) 70 - 99 mg/dL    Comment: Glucose reference range applies only to samples  taken after fasting for at least 8 hours.  Basic metabolic panel     Status: Abnormal   Collection Time: 07/22/20  3:50 AM  Result Value Ref Range   Sodium 142 135 - 145 mmol/L   Potassium 5.0 3.5 - 5.1 mmol/L   Chloride 106 98 - 111 mmol/L   CO2 28 22 - 32 mmol/L   Glucose, Bld 200 (H) 70 - 99 mg/dL    Comment: Glucose reference range applies only to samples taken after fasting for at least 8 hours.   BUN 74 (H) 6 - 20 mg/dL   Creatinine, Ser 4.38 (H) 0.61 - 1.24 mg/dL   Calcium 8.6 (L) 8.9 - 10.3 mg/dL   GFR, Estimated 15 (L) >60 mL/min    Comment: (NOTE) Calculated using the CKD-EPI Creatinine Equation (2021)    Anion gap 8 5 - 15    Comment: Performed at [facility], [address] [place], [place] [address]  Hepatitis B surface antigen     Status: None   Collection Time: 07/22/20  3:50 AM  Result Value Ref Range   Hepatitis B Surface Ag NON REACTIVE NON REACTIVE    Comment: Performed at [facility] [address] [place], [place] [address]  Hepatitis B surface antibody,qualitative     Status: None   Collection Time: 07/22/20  3:50 AM  Result Value Ref Range   Hep B S Ab NON REACTIVE NON REACTIVE    Comment: (NOTE) Inconsistent with immunity, less than 10 mIU/mL.  Performed at [facility], [place] [address], [place], [place] [address]   Hepatitis B core antibody, total     Status: None   Collection Time: 07/22/20  3:50 AM  Result Value Ref Range   Hep B Core Total Ab NON REACTIVE NON REACTIVE    Comment: Performed at [facility] [address], [place], [place] [address]  Hepatitis C antibody     Status: None   Collection Time: 07/22/20  3:50 AM  Result Value Ref Range   HCV Ab NON REACTIVE NON REACTIVE    Comment: (NOTE) Nonreactive HCV antibody screen is consistent with no HCV infections,  unless recent infection is suspected or other evidence exists to indicate HCV infection.  Performed at [facility], [place] [address] [place], [place] [address]   Glucose, capillary     Status: Abnormal   Collection Time: 07/22/20  8:03 AM  Result Value Ref Range   Glucose-Capillary 182 (H) 70 - 99 mg/dL    Comment: Glucose reference range applies only to samples taken after fasting for at least 8 hours.  Glucose, capillary     Status: Abnormal   Collection Time: 07/22/20 11:58 AM  Result Value Ref Range   Glucose-Capillary 192 (H) 70 -  99 mg/dL    Comment: Glucose reference range applies only to samples taken after fasting for at least 8 hours.  Microalbumin / creatinine urine ratio     Status: Abnormal   Collection Time: 07/22/20  3:48 PM  Result Value Ref Range   Microalb, Ur 1,682.3 (H) Not Estab. ug/mL    Comment: (NOTE) Results confirmed on dilution.    Microalb Creat Ratio 2,485 (H) 0 - 29 mg/g creat    Comment: (NOTE)                       Normal:                0 -  29                       Moderately increased: 30 - 300                       Severely increased:       >300 Performed At: [facility] [address] [place], [name] [id number] Rush Farmer MD [id number]    Creatinine, Urine 67.7 Not Estab. mg/dL  Glucose, capillary     Status: Abnormal   Collection Time: 07/22/20  3:48 PM  Result Value Ref Range   Glucose-Capillary 284 (H) 70 - 99 mg/dL    Comment: Glucose reference range applies only to samples taken after fasting for at least 8 hours.  Glucose, capillary     Status: Abnormal   Collection Time: 07/22/20  7:36 PM  Result Value Ref Range   Glucose-Capillary 250 (H) 70 - 99 mg/dL    Comment: Glucose reference range applies only to samples taken after fasting for at least 8 hours.  Basic metabolic panel     Status: Abnormal   Collection Time: 07/23/20  3:56 AM  Result Value Ref Range   Sodium 139 135 - 145 mmol/L   Potassium 5.0 3.5 - 5.1 mmol/L   Chloride 104 98 - 111 mmol/L   CO2 25 22 - 32 mmol/L   Glucose, Bld 162 (H) 70 - 99 mg/dL    Comment: Glucose reference  range applies only to samples taken after fasting for at least 8 hours.   BUN 70 (H) 6 - 20 mg/dL   Creatinine, Ser 4.23 (H) 0.61 - 1.24 mg/dL   Calcium 8.8 (L) 8.9 - 10.3 mg/dL   GFR, Estimated 16 (L) >60 mL/min    Comment: (NOTE) Calculated using the CKD-EPI Creatinine Equation (2021)    Anion gap 10 5 - 15    Comment: Performed at [facility], [address] [place], [place] [address]  Glucose, capillary     Status: Abnormal   Collection Time: 07/23/20  7:20 AM  Result Value Ref Range   Glucose-Capillary 165 (H) 70 - 99 mg/dL    Comment: Glucose reference range applies only to samples taken after fasting for at least 8 hours.  Glucose, capillary     Status: Abnormal   Collection Time: 07/23/20  8:00 AM  Result Value Ref Range   Glucose-Capillary 175 (H) 70 - 99 mg/dL    Comment: Glucose reference range applies only to samples taken after fasting for at least 8 hours.  Glucose, capillary     Status: Abnormal   Collection Time: 07/23/20 11:02 AM  Result Value Ref Range   Glucose-Capillary 311 (H) 70 - 99 mg/dL    Comment: Glucose reference  range applies only to samples taken after fasting for at least 8 hours.  Glucose, capillary     Status: Abnormal   Collection Time: 07/23/20  5:16 PM  Result Value Ref Range   Glucose-Capillary 180 (H) 70 - 99 mg/dL    Comment: Glucose reference range applies only to samples taken after fasting for at least 8 hours.    Current Facility-Administered Medications  Medication Dose Route Frequency Provider Last Rate Last Admin  . 0.9 %  sodium chloride infusion  250 mL Intravenous PRN [name] A, MD      . acetaminophen (TYLENOL) tablet 650 mg  650 mg Oral Q6H PRN Wellington Hampshire, MD   650 mg at 07/22/20 2135  . albuterol (VENTOLIN HFA) 108 (90 Base) MCG/ACT inhaler 2 puff  2 puff Inhalation Q4H PRN Wellington Hampshire, MD      . alum & mag hydroxide-simeth (MAALOX/MYLANTA) 200-200-20 MG/5ML suspension 30 mL  30 mL Oral Q4H PRN  Wellington Hampshire, MD   30 mL at 07/22/20 0906  . amLODipine (NORVASC) tablet 2.5 mg  2.5 mg Oral Daily Theora Gianotti, NP   2.5 mg at 07/23/20 0844  . aspirin EC tablet 81 mg  81 mg Oral Daily [name] A, MD   81 mg at 07/23/20 0845  . atorvastatin (LIPITOR) tablet 80 mg  80 mg Oral Daily Theora Gianotti, NP   80 mg at 07/23/20 0843  . bisoprolol (ZEBETA) tablet 10 mg  10 mg Oral Daily Wellington Hampshire, MD   10 mg at 07/23/20 0845  . calcium carbonate (TUMS - dosed in mg elemental calcium) chewable tablet 200 mg of elemental calcium  1 tablet Oral TID PRN Wellington Hampshire, MD   200 mg of elemental calcium at 07/16/20 2342  . citalopram (CELEXA) tablet 20 mg  20 mg Oral Daily [name] A, MD   20 mg at 07/23/20 0844  . clonazePAM (KLONOPIN) disintegrating tablet 0.25 mg  0.25 mg Oral BID Jennye Boroughs, MD   0.25 mg at 07/23/20 0844  . dextromethorphan-guaiFENesin (MUCINEX DM) 30-600 MG per 12 hr tablet 1 tablet  1 tablet Oral BID PRN Wellington Hampshire, MD   1 tablet at 07/20/20 0438  . feeding supplement (GLUCERNA SHAKE) (GLUCERNA SHAKE) liquid 237 mL  237 mL Oral BID BM [name] A, MD   237 mL at 07/23/20 1454  . heparin injection 5,000 Units  5,000 Units Subcutaneous Q8H [name] A, MD   5,000 Units at 07/23/20 1515  . hydrALAZINE (APRESOLINE) tablet 100 mg  100 mg Oral BID [name] A, MD   100 mg at 07/23/20 0844  . insulin aspart (novoLOG) injection 0-5 Units  0-5 Units Subcutaneous QHS Wellington Hampshire, MD   2 Units at 07/22/20 2134  . insulin aspart (novoLOG) injection 0-9 Units  0-9 Units Subcutaneous TID WC Wellington Hampshire, MD   7 Units at 07/23/20 1146  . insulin detemir (LEVEMIR) injection 25 Units  25 Units Subcutaneous Daily Fritzi Mandes, MD   25 Units at 07/23/20 [phone]  . isosorbide mononitrate (IMDUR) 24 hr tablet 60 mg  60 mg Oral Daily Theora Gianotti, NP   60 mg at 07/23/20 1058  . morphine 2 MG/ML injection 2 mg  2  mg Intravenous Q2H PRN Athena Masse, MD   2 mg at 07/22/20 0228  . multivitamin with minerals tablet 1 tablet  1 tablet Oral Daily Wellington Hampshire, MD  1 tablet at 07/23/20 0843  . nitroGLYCERIN (NITROSTAT) SL tablet 0.4 mg  0.4 mg Sublingual Q5 min PRN Wellington Hampshire, MD   0.4 mg at 07/22/20 0208  . ondansetron (ZOFRAN) injection 4 mg  4 mg Intravenous Q6H PRN Wellington Hampshire, MD   4 mg at 07/16/20 1222  . ondansetron (ZOFRAN) tablet 4 mg  4 mg Oral Q6H PRN Wellington Hampshire, MD   4 mg at 07/20/20 1544  . pantoprazole (PROTONIX) EC tablet 40 mg  40 mg Oral Daily [name] A, MD   40 mg at 07/23/20 0845  . sodium chloride flush (NS) 0.9 % injection 3 mL  3 mL Intravenous Q12H Wellington Hampshire, MD   3 mL at 07/23/20 0850  . sodium chloride flush (NS) 0.9 % injection 3 mL  3 mL Intravenous PRN [name] A, MD   3 mL at 07/23/20 0850  . zolpidem (AMBIEN) tablet 10 mg  10 mg Oral QHS PRN Clapacs, Madie Reno, MD        Musculoskeletal: Strength & Muscle Tone: decreased Gait & Station: normal Patient leans: N/A  Psychiatric Specialty Exam: Physical Exam Constitutional:      Appearance: He is well-developed and well-nourished.  HENT:     Head: Normocephalic and atraumatic.  Eyes:     Conjunctiva/sclera: Conjunctivae normal.     Pupils: Pupils are equal, round, and reactive to light.  Cardiovascular:     Heart sounds: Normal heart sounds.  Pulmonary:     Effort: Pulmonary effort is normal.  Abdominal:     Palpations: Abdomen is soft.  Musculoskeletal:        General: Normal range of motion.     Cervical back: Normal range of motion.  Skin:    General: Skin is warm and dry.  Neurological:     General: No focal deficit present.     Mental Status: He is alert.  Psychiatric:        Attention and Perception: Attention normal.        Mood and Affect: Affect is blunt.        Speech: Speech is delayed.        Behavior: Behavior is slowed.        Thought Content:  Thought content is not paranoid. Thought content does not include homicidal or suicidal ideation.        Cognition and Memory: Cognition normal.        Judgment: Judgment normal.     Review of Systems  Constitutional: Negative.   HENT: Negative.   Eyes: Negative.   Respiratory: Negative.   Cardiovascular: Negative.   Gastrointestinal: Negative.   Musculoskeletal: Negative.   Skin: Negative.   Neurological: Negative.   Psychiatric/Behavioral: Negative.     Blood pressure 125/71, pulse 67, temperature 97.8 F (36.6 C), resp. rate 19, height '5\' 5"'$  (1.651 m), weight 72.2 kg, SpO2 98 %.Body mass index is 26.49 kg/m.  General Appearance: Casual  Eye Contact:  Good  Speech:  Clear and Coherent  Volume:  Normal  Mood:  Euthymic  Affect:  Constricted  Thought Process:  Goal Directed  Orientation:  Full (Time, Place, and Person)  Thought Content:  Logical  Suicidal Thoughts:  No  Homicidal Thoughts:  No  Memory:  Immediate;   Fair Recent;   Fair Remote;   Fair  Judgement:  Fair  Insight:  Fair  Psychomotor Activity:  Decreased  Concentration:  Concentration: Fair  Recall:  [employer]  of Knowledge:  Fair  Language:  Fair  Akathisia:  No  Handed:  Right  AIMS (if indicated):     Assets:  Desire for Improvement Housing Resilience  ADL's:  Intact  Cognition:  WNL  Sleep:        Treatment Plan Summary: Medication management and Plan Patient is doing better.  I spent some time explaining to him the rationale of the standing Klonopin.  He can continue this in the hospital and when he goes home use it as needed.  Continue citalopram seems to be doing well without any current indication that he would need hospitalization on the psychiatric ward.  Disposition: Patient does not meet criteria for psychiatric inpatient admission. Supportive therapy provided about ongoing stressors.  Alethia Berthold, MD 07/23/2020 5:32 PM

## 2020-07-23 NOTE — Progress Notes (Signed)
Progress Note  Patient Name: Jimmy Olson Date of Encounter: 07/23/2020  Primary Cardiologist: Ida Rogue, MD  Subjective   Resting in bed with eyes closed, easily arousable. States he feels better. Denies chest pain; c/o slight headache. States has pain and points to right flank area.  Inpatient Medications    Scheduled Meds: . amLODipine  2.5 mg Oral Daily  . aspirin EC  81 mg Oral Daily  . atorvastatin  80 mg Oral Daily  . bisoprolol  10 mg Oral Daily  . citalopram  20 mg Oral Daily  . clonazepam  0.25 mg Oral BID  . feeding supplement (GLUCERNA SHAKE)  237 mL Oral BID BM  . heparin  5,000 Units Subcutaneous Q8H  . hydrALAZINE  100 mg Oral BID  . insulin aspart  0-5 Units Subcutaneous QHS  . insulin aspart  0-9 Units Subcutaneous TID WC  . insulin detemir  25 Units Subcutaneous Daily  . isosorbide mononitrate  60 mg Oral Daily  . multivitamin with minerals  1 tablet Oral Daily  . pantoprazole  40 mg Oral Daily  . sodium chloride flush  3 mL Intravenous Q12H   Continuous Infusions: . sodium chloride      Vital Signs    Vitals:   07/22/20 1627 07/22/20 1935 07/23/20 0336 07/23/20 0722  BP: (!) 160/80 133/72 (!) 149/78 (!) 141/75  Pulse: 71 71 70 67  Resp:  '19 18 18  '$ Temp: 97.8 F (36.6 C) 97.6 F (36.4 C) 98.8 F (37.1 C) 98.1 F (36.7 C)  TempSrc: Oral     SpO2: 97% 97% 94% 98%  Weight:   72.2 kg   Height:        Intake/Output Summary (Last 24 hours) at 07/23/2020 0731 Last data filed at 07/23/2020 0350 Gross per 24 hour  Intake 240 ml  Output 1925 ml  Net -1685 ml   Filed Weights   07/21/20 0500 07/22/20 0429 07/23/20 0336  Weight: 74.6 kg 74.3 kg 72.2 kg    Physical Exam   GEN: Well nourished, well developed, in no acute distress.  HEENT: Grossly normal.  Neck: Supple, no JVD, carotid bruits, or masses. Cardiac: RRR, no murmurs, rubs, or gallops. No clubbing, cyanosis, edema.  Radials 2+, DP/PT 2+ and equal bilaterally.  Respiratory:   Respirations regular and unlabored, clear to auscultation bilaterally. GI: Soft, nontender, nondistended, BS + x 4. MS: no deformity or atrophy.  Skin: warm and dry, no rash. Neuro:  Strength and sensation are intact. Psych: AAOx3.  Normal affect.  Labs    Chemistry Recent Labs  Lab 07/21/20 0837 07/22/20 0350 07/23/20 0356  NA 137 142 139  K 4.7 5.0 5.0  CL 104 106 104  CO2 '23 28 25  '$ GLUCOSE 193* 200* 162*  BUN 73* 74* 70*  CREATININE 4.70* 4.38* 4.23*  CALCIUM 8.6* 8.6* 8.8*  GFRNONAA 14* 15* 16*  ANIONGAP '10 8 10     '$ Hematology Recent Labs  Lab 07/18/20 0552 07/19/20 0152 07/20/20 0443  WBC 8.2 6.6 5.6  RBC 3.92* 3.61* 3.25*  HGB 11.8* 10.9* 9.5*  HCT 34.5* 31.6* 29.1*  MCV 88.0 87.5 89.5  MCH 30.1 30.2 29.2  MCHC 34.2 34.5 32.6  RDW 12.0 12.1 12.0  PLT 216 219 200    Cardiac Enzymes  Recent Labs  Lab 07/15/20 1124 07/15/20 1943 07/15/20 2240 07/17/20 1026 07/20/20 0443  TROPONINIHS 176* 190* 200* 133* 94*      BNP Recent Labs  Lab 07/18/20  0552 07/20/20 0443  BNP 418.7* 584.1*     Lipids  Lab Results  Component Value Date   CHOL 186 07/16/2020   HDL 40 (L) 07/16/2020   LDLCALC 110 (H) 07/16/2020   TRIG 178 (H) 07/16/2020   CHOLHDL 4.7 07/16/2020    HbA1c  Lab Results  Component Value Date   HGBA1C 9.7 (H) 07/15/2020    Radiology    DG Chest Port 1 View  Result Date: 07/20/2020 CLINICAL DATA:  Chest pain. EXAM: PORTABLE CHEST 1 VIEW COMPARISON:  07/18/2020. FINDINGS: Borderline cardiomegaly again noted. No pulmonary venous congestion. Low lung volumes again noted. No focal infiltrate. No pleural effusion or pneumothorax. Degenerative change thoracic spine. IMPRESSION: 1. Borderline cardiomegaly again noted. No pulmonary venous congestion. 2. No acute pulmonary disease. Electronically Signed   By: Marcello Moores  Register   On: 07/20/2020 06:26    Telemetry    RSR @ rates [id number]  - Personally Reviewed  Cardiac Studies    ECHO  1.27.22  1. Left ventricular ejection fraction, by estimation, is 30 to 35%. The  left ventricle has moderately decreased function. The left ventricle  demonstrates global hypokinesis. The left ventricular internal cavity size  was moderately dilated. There is mild  left ventricular hypertrophy. Left ventricular diastolic parameters are  consistent with Grade II diastolic dysfunction (pseudonormalization). The  average left ventricular global longitudinal strain is -10.1 %. The global  longitudinal strain is abnormal.  2. Right ventricular systolic function is normal. The right ventricular  size is normal. Tricuspid regurgitation signal is inadequate for assessing  PA pressure.  3. Left atrial size was mildly dilated.  4. The inferior vena cava is normal in size with greater than 50%  respiratory variability, suggesting right atrial pressure of 3 mmHg.    R/L Heart Cath 1.31.22   Prox RCA lesion is 20% stenosed.  Mid RCA lesion is 30% stenosed.  Dist RCA lesion is 50% stenosed with 50% stenosed side branch in RPAV.  Prox LAD lesion is 40% stenosed.  1st Diag lesion is 70% stenosed.  Mid LAD lesion is 60% stenosed.  1. Moderately severely calcified coronary arteries with moderate two-vessel coronary artery disease involving the LAD and right coronary artery. No evidence of critical stenosis. 2. Right heart catheterization showed mildly elevated filling pressures, mild pulmonary hypertension and normal cardiac output. 3. Left ventricular angiography was not performed due to chronic kidney disease. EF was moderately reduced by echo.  Recommendations: Recommend aggressive medical therapy for nonobstructive coronary artery disease. Cardiomyopathy seems to be out of proportion to coronary artery disease.  Patient Profile   Mr. Nolting is a 60 year old man with history of chronic HFrEF, poorly controlled hypertension, hyperlipidemia, DM 2, CKD stage IV, and COVID-19  infection in 02/2020, who presented for acute HFrEF and elevated troponin on 07/15/20. Catheterization 1/31 showed nonobstructive CAD consistent with nonischemic cardiomyopathy. His left heart filling pressures were only mildly elevated.   Assessment & Plan    1. Acute on chronic combined systolic and diastolic HF/NICM: Admitted 1/27 w/progressive angina, hypertensive urgency, AKI, volume overload, and NSTEMI. Echo w/EF 30-35%, G2DD. Cath 1/31 showed mildly elev filling pressures. Volume status euvolemic. Continue  blocker, hydralazine, nitrate. Started CCB 2/3. No ACEI/ARB/ARNI/MRA in setting of acute CKD IV.   2. Unstable Angina/Demand Ischemia/non-obstructive CAD: Nonobstructive CAD on cath. Decreased Imdur to 60 mg on 2/3 due to headache, added amlodipine 2.5 for additional anti-anginal effect. Patient chest pain free at present.   3. Hyperlipidemia: LDL 110, not at  goal <70. Continue atorvastatin and aspirin for GDMT to prevent progression. Recheck lipid levels in 8 weeks w/consideration of zetia +/- PCSK9i if not at goal.   4. CKD IV: Lasix remains on hold. Creat 4.23 today, trending down from yesterday. K+ remains at 5.  Nephrology following.  No indication for HD at this time.  5. Essential HTN: BP stable @ systolic [id number] mmHg this morning, was 140 mmHg through the night. Started amlodipine 2.5 mg on 2/3, reduced Imdur to 60 mg QD.Continue  blocker, hydralazine, nitrate. No ACEI/ARB/ARNI/MRA in setting of acute CKD IV. Will continue to monitor BP trend.    6. Normocytic Anemia: H/H trending down, last checked 2/1. Currently on ASA 81 mg, no role for P2Y12. Continue to follow.   Signed, Murray Hodgkins, NP  07/23/2020, 7:31 AM    For questions or updates, please contact   Please consult [email or web address] for contact info under Cardiology/STEMI.

## 2020-07-23 NOTE — Progress Notes (Signed)
[place], [name] 07/23/20  Subjective:   Hospital day # 8  Overall feeling better.  Currently on room air. States he is able to void without any problem. Serum creatinine further improved a tiny bit Ate 100% of his breakfast this morning  Renal: 02/03 0701 - 02/04 0700 In: 240 [P.O.:240] Out: 1925 [Urine:1925] Lab Results  Component Value Date   CREATININE 4.23 (H) 07/23/2020   CREATININE 4.38 (H) 07/22/2020   CREATININE 4.70 (H) 07/21/2020     Objective:  Vital signs in last 24 hours:  Temp:  [97.6 F (36.4 C)-98.8 F (37.1 C)] 98.2 F (36.8 C) (02/04 1101) Pulse Rate:  [67-71] 70 (02/04 1101) Resp:  [16-19] 17 (02/04 1101) BP: (114-160)/(57-80) 114/57 (02/04 1101) SpO2:  [93 %-100 %] 93 % (02/04 1101) Weight:  [72.2 kg] 72.2 kg (02/04 0336)  Weight change: -2.087 kg Filed Weights   07/21/20 0500 07/22/20 0429 07/23/20 0336  Weight: 74.6 kg 74.3 kg 72.2 kg    Intake/Output:    Intake/Output Summary (Last 24 hours) at 07/23/2020 1329 Last data filed at 07/23/2020 0745 Gross per 24 hour  Intake 240 ml  Output 1500 ml  Net -1260 ml    Physical Exam: General:  No acute distress, laying in the bed  HEENT  anicteric, moist oral mucous membrane  Pulm/lungs  normal breathing effort, lungs are clear to auscultation  CVS/Heart  regular rhythm, no rub or gallop  Abdomen:   Soft, tender  Extremities:  No peripheral edema  Neurologic:  Alert, oriented, able to follow commands  Skin:  No acute rashes     Basic Metabolic Panel:  Recent Labs  Lab 07/18/20 0552 07/19/20 0152 07/20/20 0443 07/21/20 0837 07/22/20 0350 07/23/20 0356  NA 140 139 140 137 142 139  K 4.0 4.4 4.7 4.7 5.0 5.0  CL 105 105 106 104 106 104  CO2 '25 23 26 23 28 25  '$ GLUCOSE 160* 173* 126* 193* 200* 162*  BUN 55* 62* 68* 73* 74* 70*  CREATININE 3.25* 3.64* 3.85* 4.70* 4.38* 4.23*  CALCIUM 8.9 8.9 8.5* 8.6* 8.6* 8.8*  PHOS 4.3  --   --   --   --   --       CBC: Recent Labs  Lab 07/17/20 0615 07/18/20 0552 07/19/20 0152 07/20/20 0443  WBC 7.9 8.2 6.6 5.6  HGB 12.1* 11.8* 10.9* 9.5*  HCT 34.8* 34.5* 31.6* 29.1*  MCV 86.8 88.0 87.5 89.5  PLT 248 216 219 200      Lab Results  Component Value Date   HEPBSAG NON REACTIVE 07/22/2020   HEPBSAB NON REACTIVE 07/22/2020      Microbiology:  Recent Results (from the past 240 hour(s))  SARS Coronavirus 2 by RT PCR (hospital order, performed in [facility] hospital lab) Nasopharyngeal Nasopharyngeal Swab     Status: None   Collection Time: 07/15/20  3:59 AM   Specimen: Nasopharyngeal Swab  Result Value Ref Range Status   SARS Coronavirus 2 NEGATIVE NEGATIVE Final    Comment: (NOTE) SARS-CoV-2 target nucleic acids are NOT DETECTED.  The SARS-CoV-2 RNA is generally detectable in upper and lower respiratory specimens during the acute phase of infection. The lowest concentration of SARS-CoV-2 viral copies this assay can detect is 250 copies / mL. A negative result does not preclude SARS-CoV-2 infection and should not be used as the sole basis for treatment or other patient management decisions.  A negative result may occur with improper specimen collection /  handling, submission of specimen other than nasopharyngeal swab, presence of viral mutation(s) within the areas targeted by this assay, and inadequate number of viral copies (<250 copies / mL). A negative result must be combined with clinical observations, patient history, and epidemiological information.  Fact Sheet for Patients:   StrictlyIdeas.no  Fact Sheet for Healthcare Providers: BankingDealers.co.za  This test is not yet approved or  cleared by the [place] FDA and has been authorized for detection and/or diagnosis of SARS-CoV-2 by FDA under an Emergency Use Authorization (EUA).  This EUA will remain in effect (meaning this test can be used) for the duration of  the COVID-19 declaration under Section 564(b)(1) of the Act, 21 U.S.C. section 360bbb-3(b)(1), unless the authorization is terminated or revoked sooner.  Performed at [facility], [address] [place], [place] [address]   Culture, blood (single)     Status: None   Collection Time: 07/15/20  3:59 AM   Specimen: BLOOD  Result Value Ref Range Status   Specimen Description BLOOD RIGHT FA  Final   Special Requests   Final    BOTTLES DRAWN AEROBIC AND ANAEROBIC Blood Culture results may not be optimal due to an excessive volume of blood received in culture bottles   Culture   Final    NO GROWTH 5 DAYS Performed at [facility], [address] [place], [place] [address]    Report Status 07/20/2020 FINAL  Final    Coagulation Studies: No results for input(s): LABPROT, INR in the last 72 hours.  Urinalysis: No results for input(s): COLORURINE, LABSPEC, PHURINE, GLUCOSEU, HGBUR, BILIRUBINUR, KETONESUR, PROTEINUR, UROBILINOGEN, NITRITE, LEUKOCYTESUR in the last 72 hours.  Invalid input(s): APPERANCEUR    Imaging: No results found.   Medications:   . sodium chloride     . amLODipine  2.5 mg Oral Daily  . aspirin EC  81 mg Oral Daily  . atorvastatin  80 mg Oral Daily  . bisoprolol  10 mg Oral Daily  . citalopram  20 mg Oral Daily  . clonazepam  0.25 mg Oral BID  . feeding supplement (GLUCERNA SHAKE)  237 mL Oral BID BM  . heparin  5,000 Units Subcutaneous Q8H  . hydrALAZINE  100 mg Oral BID  . insulin aspart  0-5 Units Subcutaneous QHS  . insulin aspart  0-9 Units Subcutaneous TID WC  . insulin detemir  25 Units Subcutaneous Daily  . isosorbide mononitrate  60 mg Oral Daily  . multivitamin with minerals  1 tablet Oral Daily  . pantoprazole  40 mg Oral Daily  . sodium chloride flush  3 mL Intravenous Q12H   sodium chloride, acetaminophen, albuterol, alum & mag hydroxide-simeth, calcium carbonate, dextromethorphan-guaiFENesin, morphine injection,  nitroGLYCERIN, ondansetron (ZOFRAN) IV, ondansetron, sodium chloride flush, zolpidem  Assessment/ Plan:  60 y.o. male with diabetes, hypertension, CHF  admitted on 07/15/2020 for SOB (shortness of breath) [R06.02] Elevated troponin [R77.8] Acute CHF (congestive heart failure) (HCC) [I50.9] Acute on chronic systolic congestive heart failure (HCC) [I50.23] Acute on chronic systolic CHF (congestive heart failure) ([place]) [I50.23]  07/16/2020: 2D echo: LVEF 30 to 35%, global hypokinesis, moderately dilated left ventricular internal cavity, grade 2 diastolic dysfunction  #Acute kidney injury Likely secondary to ATN/Cardiorenal syndrome, IV contrast exposure 02/03 0701 - 02/04 0700 In: 240 [P.O.:240] Out: 1925 [Urine:1925] Lab Results  Component Value Date   CREATININE 4.23 (H) 07/23/2020   CREATININE 4.38 (H) 07/22/2020   CREATININE 4.70 (H) 07/21/2020  Slight improvement with creatinine every day is  noted  Continue to monitor renal function closely.  Electrolytes and volume status are acceptable.  No uremic symptoms.No acute indication for dialysis at present  #Chronic kidney disease stage IV Baseline creatinine 2.96/GFR 24 from July 16, 2020 CKD likely secondary to diabetes, hypertension and atherosclerosis  #Non-STEMI Status post cardiac cath which shows moderate to severe calcified coronary arteries and two-vessel coronary disease involving LAD and RCA. Medical management recommended. 40 mL IV contrast used for cath 07/19/2020  #Type 2 diabetes with CKD Lab Results  Component Value Date   HGBA1C 9.7 (H) 07/15/2020  Obtain urine microalbumin creatinine ratio Statin-Crestor Was on Zestril at home 30 mg daily. We will be able to restart once creatinine stabilizes.    LOS: [place] 2/4/20221:29 PM  [place], [place]  Note: This note was prepared with Dragon dictation. Any transcription errors are unintentional

## 2020-07-23 NOTE — Progress Notes (Addendum)
Progress Note    Jimmy Olson  [id number] DOB: Dec 25, 1960  DOA: 07/15/2020 PCP: [facility] [place]      Brief Narrative:    Medical records reviewed and are as summarized below:  Jimmy Olson is a 60 y.o. male with medical history significant ofsCHF withEF of 45 to 50%, hypertension, hyperlipidemia, diabetes mellitus, COVID-19 infection 4 months ago, CKD 3B, to the hospital with chest pain and chest tightness.  He was admitted to the hospital for acute on chronic systolic and diastolic CHF complicated by acute hypoxic respiratory failure.  He was initially treated with IV heparin infusion because of concern for acute NSTEMI.  He underwent left and right heart cath there was no evidence of critical stenosis.  However NSTEMI was ruled out.  Elevated troponins were attributed to demand ischemia.  He was treated with IV Lasix and oxygen.     Assessment/Plan:   Principal Problem:   Severe recurrent major depression without psychotic features ([place]) Active Problems:   Essential hypertension   Elevated troponin   Type II diabetes mellitus with renal manifestations (HCC)   Acute on chronic HFrEF (heart failure with reduced ejection fraction) (HCC)   Acute renal failure superimposed on stage 4 chronic kidney disease (HCC)   HLD (hyperlipidemia)   ATN (acute tubular necrosis) (HCC)   Demand ischemia (HCC)   Coronary artery disease involving native coronary artery of native heart without angina pectoris   Nutrition Problem: Inadequate oral intake Etiology: decreased appetite  Signs/Symptoms: per patient/family report   Body mass index is 26.49 kg/m.     Acute on chronic systolic and diastolic CHF: Lasix is still on hold.    Acute hypoxic respiratory failure: He has been weaned off of oxygen and he is tolerating room air.  AKI on CKD stage IV: Creatinine continues to trend down slowly.  No indication for hemodialysis at this time.  Monitor BMP.   Follow-up with nephrologist for further recommendations.  Nonobstructive CAD, elevated troponins: Elevated troponins attributed to demand ischemia.  S/p left and right heart cath which showed moderately severely calcified coronary arteries with moderate two-vessel coronary artery disease involving LAD and right coronary artery.  There was no evidence of critical stenosis. Continue aspirin, Imdur and Lipitor.  Hypertension: Continue metoprolol  Depression and anxiety: He has been evaluated by the psychiatrist.  He does not meet criteria for psychiatric inpatient admission.  Continue Celexa.  Clonazepam was added yesterday..   Diet Order            Diet heart healthy/carb modified Room service appropriate? Yes; Fluid consistency: Thin  Diet effective now                    Consultants:  Cardiologist  Nephrologist  Procedures:  Left and right heart cath on 07/19/2020    Medications:   . amLODipine  2.5 mg Oral Daily  . aspirin EC  81 mg Oral Daily  . atorvastatin  80 mg Oral Daily  . bisoprolol  10 mg Oral Daily  . citalopram  20 mg Oral Daily  . clonazepam  0.25 mg Oral BID  . feeding supplement (GLUCERNA SHAKE)  237 mL Oral BID BM  . heparin  5,000 Units Subcutaneous Q8H  . hydrALAZINE  100 mg Oral BID  . insulin aspart  0-5 Units Subcutaneous QHS  . insulin aspart  0-9 Units Subcutaneous TID WC  . insulin detemir  25 Units Subcutaneous Daily  .  isosorbide mononitrate  60 mg Oral Daily  . multivitamin with minerals  1 tablet Oral Daily  . pantoprazole  40 mg Oral Daily  . sodium chloride flush  3 mL Intravenous Q12H   Continuous Infusions: . sodium chloride       Anti-infectives (From admission, onward)   None             Family Communication/Anticipated D/C date and plan/Code Status   DVT prophylaxis: heparin injection 5,000 Units Start: 07/19/20 2200     Code Status: Full Code  Family Communication: None Disposition Plan:    Status is:  Inpatient  Remains inpatient appropriate because:Inpatient level of care appropriate due to severity of illness   Dispo: The patient is from: Home              Anticipated d/c is to: Home              Anticipated d/c date is: 1 day              Patient currently is not medically stable to d/c.   Difficult to place patient No           Subjective:   He feels better today.  No chest pain or shortness of breath.   Objective:    Vitals:   07/22/20 1935 07/23/20 0336 07/23/20 0722 07/23/20 1101  BP: 133/72 (!) 149/78 (!) 141/75 (!) 114/57  Pulse: 71 70 67 70  Resp: '19 18 18 17  '$ Temp: 97.6 F (36.4 C) 98.8 F (37.1 C) 98.1 F (36.7 C) 98.2 F (36.8 C)  TempSrc:    Oral  SpO2: 97% 94% 98% 93%  Weight:  72.2 kg    Height:       No data found.   Intake/Output Summary (Last 24 hours) at 07/23/2020 1450 Last data filed at 07/23/2020 0745 Gross per 24 hour  Intake 240 ml  Output 1325 ml  Net -1085 ml   Filed Weights   07/21/20 0500 07/22/20 0429 07/23/20 0336  Weight: 74.6 kg 74.3 kg 72.2 kg    Exam:   GEN: NAD SKIN: No rash EYES: EOMI ENT: MMM CV: RRR PULM: CTA B ABD: soft, ND, NT, +BS CNS: AAO x 3, non focal EXT: No edema or tenderness      Data Reviewed:   I have personally reviewed following labs and imaging studies:  Labs: Labs show the following:   Basic Metabolic Panel: Recent Labs  Lab 07/18/20 0552 07/19/20 0152 07/20/20 0443 07/21/20 0837 07/22/20 0350 07/23/20 0356  NA 140 139 140 137 142 139  K 4.0 4.4 4.7 4.7 5.0 5.0  CL 105 105 106 104 106 104  CO2 '25 23 26 23 28 25  '$ GLUCOSE 160* 173* 126* 193* 200* 162*  BUN 55* 62* 68* 73* 74* 70*  CREATININE 3.25* 3.64* 3.85* 4.70* 4.38* 4.23*  CALCIUM 8.9 8.9 8.5* 8.6* 8.6* 8.8*  PHOS 4.3  --   --   --   --   --    GFR Estimated Creatinine Clearance: 16.8 mL/min (A) (by C-G formula based on SCr of 4.23 mg/dL (H)). Liver Function Tests: No results for input(s): AST, ALT, ALKPHOS,  BILITOT, PROT, ALBUMIN in the last 168 hours. No results for input(s): LIPASE, AMYLASE in the last 168 hours. No results for input(s): AMMONIA in the last 168 hours. Coagulation profile No results for input(s): INR, PROTIME in the last 168 hours.  CBC: Recent Labs  Lab 07/17/20 0615 07/18/20  [id number] 07/19/20 0152 07/20/20 0443  WBC 7.9 8.2 6.6 5.6  HGB 12.1* 11.8* 10.9* 9.5*  HCT 34.8* 34.5* 31.6* 29.1*  MCV 86.8 88.0 87.5 89.5  PLT 248 216 219 200   Cardiac Enzymes: No results for input(s): CKTOTAL, CKMB, CKMBINDEX, TROPONINI in the last 168 hours. BNP (last 3 results) No results for input(s): PROBNP in the last 8760 hours. CBG: Recent Labs  Lab 07/22/20 1548 07/22/20 1936 07/23/20 0720 07/23/20 0800 07/23/20 1102  GLUCAP 284* 250* 165* 175* 311*   D-Dimer: No results for input(s): DDIMER in the last 72 hours. Hgb A1c: No results for input(s): HGBA1C in the last 72 hours. Lipid Profile: No results for input(s): CHOL, HDL, LDLCALC, TRIG, CHOLHDL, LDLDIRECT in the last 72 hours. Thyroid function studies: No results for input(s): TSH, T4TOTAL, T3FREE, THYROIDAB in the last 72 hours.  Invalid input(s): FREET3 Anemia work up: No results for input(s): VITAMINB12, FOLATE, FERRITIN, TIBC, IRON, RETICCTPCT in the last 72 hours. Sepsis Labs: Recent Labs  Lab 07/17/20 0615 07/18/20 0552 07/19/20 0152 07/20/20 0443  WBC 7.9 8.2 6.6 5.6    Microbiology Recent Results (from the past 240 hour(s))  SARS Coronavirus 2 by RT PCR (hospital order, performed in [facility] hospital lab) Nasopharyngeal Nasopharyngeal Swab     Status: None   Collection Time: 07/15/20  3:59 AM   Specimen: Nasopharyngeal Swab  Result Value Ref Range Status   SARS Coronavirus 2 NEGATIVE NEGATIVE Final    Comment: (NOTE) SARS-CoV-2 target nucleic acids are NOT DETECTED.  The SARS-CoV-2 RNA is generally detectable in upper and lower respiratory specimens during the acute phase of infection. The  lowest concentration of SARS-CoV-2 viral copies this assay can detect is 250 copies / mL. A negative result does not preclude SARS-CoV-2 infection and should not be used as the sole basis for treatment or other patient management decisions.  A negative result may occur with improper specimen collection / handling, submission of specimen other than nasopharyngeal swab, presence of viral mutation(s) within the areas targeted by this assay, and inadequate number of viral copies (<250 copies / mL). A negative result must be combined with clinical observations, patient history, and epidemiological information.  Fact Sheet for Patients:   StrictlyIdeas.no  Fact Sheet for Healthcare Providers: BankingDealers.co.za  This test is not yet approved or  cleared by the [place] FDA and has been authorized for detection and/or diagnosis of SARS-CoV-2 by FDA under an Emergency Use Authorization (EUA).  This EUA will remain in effect (meaning this test can be used) for the duration of the COVID-19 declaration under Section 564(b)(1) of the Act, 21 U.S.C. section 360bbb-3(b)(1), unless the authorization is terminated or revoked sooner.  Performed at [facility], [address] [place], [place] [address]   Culture, blood (single)     Status: None   Collection Time: 07/15/20  3:59 AM   Specimen: BLOOD  Result Value Ref Range Status   Specimen Description BLOOD RIGHT FA  Final   Special Requests   Final    BOTTLES DRAWN AEROBIC AND ANAEROBIC Blood Culture results may not be optimal due to an excessive volume of blood received in culture bottles   Culture   Final    NO GROWTH 5 DAYS Performed at [facility], [address] [place], [place] [address]    Report Status 07/20/2020 FINAL  Final    Procedures and diagnostic studies:  No results found.  LOS: 8 days   Dorothee Napierkowski  [facility]    Pager on [email or web address]. If 7PM-7AM, please contact night-coverage at [email or web address]     07/23/2020, 2:50 PM

## 2020-07-24 ENCOUNTER — Encounter: Payer: Self-pay | Admitting: Family Medicine

## 2020-07-24 DIAGNOSIS — F332 Major depressive disorder, recurrent severe without psychotic features: Secondary | ICD-10-CM | POA: Diagnosis not present

## 2020-07-24 LAB — GLUCOSE, CAPILLARY
Glucose-Capillary: 154 mg/dL — ABNORMAL HIGH (ref 70–99)
Glucose-Capillary: 271 mg/dL — ABNORMAL HIGH (ref 70–99)
Glucose-Capillary: 159 mg/dL — ABNORMAL HIGH (ref 70–99)
Glucose-Capillary: 201 mg/dL — ABNORMAL HIGH (ref 70–99)
Glucose-Capillary: 203 mg/dL — ABNORMAL HIGH (ref 70–99)

## 2020-07-24 LAB — BASIC METABOLIC PANEL
Anion gap: 7 (ref 5–15)
BUN: 66 mg/dL — ABNORMAL HIGH (ref 6–20)
CO2: 24 mmol/L (ref 22–32)
Calcium: 8.6 mg/dL — ABNORMAL LOW (ref 8.9–10.3)
Chloride: 108 mmol/L (ref 98–111)
Creatinine, Ser: 4 mg/dL — ABNORMAL HIGH (ref 0.61–1.24)
GFR, Estimated: 17 mL/min — ABNORMAL LOW (ref >60)
Glucose, Bld: 113 mg/dL — ABNORMAL HIGH (ref 70–99)
Potassium: 4.7 mmol/L (ref 3.5–5.1)
Sodium: 139 mmol/L (ref 135–145)

## 2020-07-24 NOTE — Progress Notes (Signed)
Progress Note    Jimmy Olson  [id number] DOB: 06-Jan-1961  DOA: 07/15/2020 [facility]CP: [facility]      Brief Narrative:    Medical records reviewed and are as summarized below:  ABDULHAMID UPPAL is a 60 y.o. male with medical history significant ofsCHF withEF of 45 to 50%, hypertension, hyperlipidemia, diabetes mellitus, COVID-19 infection 4 months ago, CKD 3B, to the hospital with chest pain and chest tightness.  He was admitted to the hospital for acute on chronic systolic and diastolic CHF complicated by acute hypoxic respiratory failure.  He was initially treated with IV heparin infusion because of concern for acute NSTEMI.  He underwent left and right heart cath there was no evidence of critical stenosis.  However NSTEMI was ruled out.  Elevated troponins were attributed to demand ischemia.  He was treated with IV Lasix and oxygen.     Assessment/Plan:   Principal Problem:   Severe recurrent major depression without psychotic features ([place]) Active Problems:   Essential hypertension   Elevated troponin   Type II diabetes mellitus with renal manifestations (HCC)   Acute on chronic HFrEF (heart failure with reduced ejection fraction) (HCC)   Acute renal failure superimposed on stage 4 chronic kidney disease (HCC)   HLD (hyperlipidemia)   ATN (acute tubular necrosis) (HCC)   Demand ischemia (HCC)   Coronary artery disease involving native coronary artery of native heart without angina pectoris   Nutrition Problem: Inadequate oral intake Etiology: decreased appetite  Signs/Symptoms: per patient/family report   Body mass index is 25.28 kg/m.     Acute on chronic systolic and diastolic CHF: Lasix is still on hold.    Acute hypoxic respiratory failure: He has been weaned off of oxygen and he is tolerating room air.  AKI on CKD stage IV: Creatinine is still trending down.  No indication for hemodialysis.  Monitor BMP and follow-up with  nephrologist for further recommendations.   Nonobstructive CAD, elevated troponins: Elevated troponins attributed to demand ischemia.  S/p left and right heart cath which showed moderately severely calcified coronary arteries with moderate two-vessel coronary artery disease involving LAD and right coronary artery.  There was no evidence of critical stenosis. Continue aspirin, Imdur and Lipitor.  Hypertension: Continue metoprolol  Anxiety and depression with suicidal ideation: I have notified the psychiatrist on-call, Dr. Danella Sensing.  Patient's nurse, Amy, has also been notified. Amy confirmed that patient had told her he was suicidal.  Surveyor, quantity has been ordered.    Diet Order            Diet heart healthy/carb modified Room service appropriate? Yes; Fluid consistency: Thin  Diet effective now                    Consultants:  Cardiologist  Nephrologist  Psychiatrist  Procedures:  Left and right heart cath on 07/19/2020    Medications:   . amLODipine  2.5 mg Oral Daily  . aspirin EC  81 mg Oral Daily  . atorvastatin  80 mg Oral Daily  . bisoprolol  10 mg Oral Daily  . citalopram  20 mg Oral Daily  . clonazepam  0.25 mg Oral BID  . feeding supplement (GLUCERNA SHAKE)  237 mL Oral BID BM  . heparin  5,000 Units Subcutaneous Q8H  . hydrALAZINE  100 mg Oral BID  . insulin aspart  0-5 Units Subcutaneous QHS  . insulin aspart  0-9 Units Subcutaneous TID WC  .  insulin detemir  25 Units Subcutaneous Daily  . isosorbide mononitrate  60 mg Oral Daily  . multivitamin with minerals  1 tablet Oral Daily  . pantoprazole  40 mg Oral Daily  . sodium chloride flush  3 mL Intravenous Q12H   Continuous Infusions: . sodium chloride       Anti-infectives (From admission, onward)   None             Family Communication/Anticipated D/C date and plan/Code Status   DVT prophylaxis: heparin injection 5,000 Units Start: 07/19/20 2200     Code Status: Full  Code  Family Communication: None Disposition Plan:    Status is: Inpatient  Remains inpatient appropriate because:Inpatient level of care appropriate due to severity of illness   Dispo: The patient is from: Home              Anticipated d/c is to: Home              Anticipated d/c date is: 1 day              Patient currently is not medically stable to d/c.   Difficult to place patient No           Subjective:   No chest pain or shortness of breath but he feels depressed.  I offered to give him a prescription for Celexa and Klonopin at discharge.  However, he told me he was not going to take these medicines because they want to do anything for him.  He said he is already taking too many medicines.  He said he was thinking of killing himself when he gets home.  He said once he gets home, nobody can stop him from doing what he wants to do.  He said he has no family-his wife has left him and his children do not look for him.  He has no job and he has no way of paying his bills.   Objective:    Vitals:   07/24/20 0329 07/24/20 0329 07/24/20 0737 07/24/20 1157  BP:  (!) 149/76 (!) 153/79 (!) 118/58  Pulse:  64 64 66  Resp:  '17 19 18  '$ Temp:  98.3 F (36.8 C) 98 F (36.7 C) 98.8 F (37.1 C)  TempSrc:  Oral Oral Oral  SpO2:  99% 97% 97%  Weight: 68.9 kg     Height:       No data found.   Intake/Output Summary (Last 24 hours) at 07/24/2020 1159 Last data filed at 07/24/2020 0328 Gross per 24 hour  Intake 240 ml  Output 1100 ml  Net -860 ml   Filed Weights   07/22/20 0429 07/23/20 0336 07/24/20 0329  Weight: 74.3 kg 72.2 kg 68.9 kg    Exam:   GEN: NAD SKIN: No rash EYES: EOMI ENT: MMM CV: RRR PULM: CTA B ABD: soft, ND, NT, +BS CNS: AAO x 3, non focal EXT: No edema or tenderness CNS: Depressed mood      Data Reviewed:   I have personally reviewed following labs and imaging studies:  Labs: Labs show the following:   Basic Metabolic  Panel: Recent Labs  Lab 07/18/20 0552 07/19/20 0152 07/20/20 0443 07/21/20 0837 07/22/20 0350 07/23/20 0356 07/24/20 0307  NA 140   < > 140 137 142 139 139  K 4.0   < > 4.7 4.7 5.0 5.0 4.7  CL 105   < > 106 104 106 104 108  CO2 25   < >  $'26 23 28 25 24  'D$ GLUCOSE 160*   < > 126* 193* 200* 162* 113*  BUN 55*   < > 68* 73* 74* 70* 66*  CREATININE 3.25*   < > 3.85* 4.70* 4.38* 4.23* 4.00*  CALCIUM 8.9   < > 8.5* 8.6* 8.6* 8.8* 8.6*  PHOS 4.3  --   --   --   --   --   --    < > = values in this interval not displayed.   GFR Estimated Creatinine Clearance: 17.7 mL/min (A) (by C-G formula based on SCr of 4 mg/dL (H)). Liver Function Tests: No results for input(s): AST, ALT, ALKPHOS, BILITOT, PROT, ALBUMIN in the last 168 hours. No results for input(s): LIPASE, AMYLASE in the last 168 hours. No results for input(s): AMMONIA in the last 168 hours. Coagulation profile No results for input(s): INR, PROTIME in the last 168 hours.  CBC: Recent Labs  Lab 07/18/20 0552 07/19/20 0152 07/20/20 0443  WBC 8.2 6.6 5.6  HGB 11.8* 10.9* 9.5*  HCT 34.5* 31.6* 29.1*  MCV 88.0 87.5 89.5  PLT 216 219 200   Cardiac Enzymes: No results for input(s): CKTOTAL, CKMB, CKMBINDEX, TROPONINI in the last 168 hours. BNP (last 3 results) No results for input(s): PROBNP in the last 8760 hours. CBG: Recent Labs  Lab 07/23/20 0800 07/23/20 1102 07/23/20 1716 07/23/20 2106 07/24/20 0736  GLUCAP 175* 311* 180* 178* 154*   D-Dimer: No results for input(s): DDIMER in the last 72 hours. Hgb A1c: No results for input(s): HGBA1C in the last 72 hours. Lipid Profile: No results for input(s): CHOL, HDL, LDLCALC, TRIG, CHOLHDL, LDLDIRECT in the last 72 hours. Thyroid function studies: No results for input(s): TSH, T4TOTAL, T3FREE, THYROIDAB in the last 72 hours.  Invalid input(s): FREET3 Anemia work up: No results for input(s): VITAMINB12, FOLATE, FERRITIN, TIBC, IRON, RETICCTPCT in the last 72  hours. Sepsis Labs: Recent Labs  Lab 07/18/20 0552 07/19/20 0152 07/20/20 0443  WBC 8.2 6.6 5.6    Microbiology Recent Results (from the past 240 hour(s))  SARS Coronavirus 2 by RT PCR (hospital order, performed in [facility] hospital lab) Nasopharyngeal Nasopharyngeal Swab     Status: None   Collection Time: 07/15/20  3:59 AM   Specimen: Nasopharyngeal Swab  Result Value Ref Range Status   SARS Coronavirus 2 NEGATIVE NEGATIVE Final    Comment: (NOTE) SARS-CoV-2 target nucleic acids are NOT DETECTED.  The SARS-CoV-2 RNA is generally detectable in upper and lower respiratory specimens during the acute phase of infection. The lowest concentration of SARS-CoV-2 viral copies this assay can detect is 250 copies / mL. A negative result does not preclude SARS-CoV-2 infection and should not be used as the sole basis for treatment or other patient management decisions.  A negative result may occur with improper specimen collection / handling, submission of specimen other than nasopharyngeal swab, presence of viral mutation(s) within the areas targeted by this assay, and inadequate number of viral copies (<250 copies / mL). A negative result must be combined with clinical observations, patient history, and epidemiological information.  Fact Sheet for Patients:   StrictlyIdeas.no  Fact Sheet for Healthcare Providers: BankingDealers.co.za  This test is not yet approved or  cleared by the [place] FDA and has been authorized for detection and/or diagnosis of SARS-CoV-2 by FDA under an Emergency Use Authorization (EUA).  This EUA will remain in effect (meaning this test can be used) for the duration of the COVID-19 declaration under  Section 564(b)(1) of the Act, 21 U.S.C. section 360bbb-3(b)(1), unless the authorization is terminated or revoked sooner.  Performed at [facility], [address] [place], [place]  [address]   Culture, blood (single)     Status: None   Collection Time: 07/15/20  3:59 AM   Specimen: BLOOD  Result Value Ref Range Status   Specimen Description BLOOD RIGHT FA  Final   Special Requests   Final    BOTTLES DRAWN AEROBIC AND ANAEROBIC Blood Culture results may not be optimal due to an excessive volume of blood received in culture bottles   Culture   Final    NO GROWTH 5 DAYS Performed at [facility], [address], [place],  [address]    Report Status 07/20/2020 FINAL  Final    Procedures and diagnostic studies:  No results found.             LOS: 9 days   Wilsie Kern  [employer], advertising on [email or web address]. If 7PM-7AM, please contact night-coverage at [email or web address]     07/24/2020, 11:59 AM

## 2020-07-24 NOTE — Progress Notes (Signed)
Patient received awake and alert. Denied suicide ideation. Asking for a razor to shave his facial hair. RN made NT aware to assist with shave using electrical razor.

## 2020-07-24 NOTE — Consult Note (Signed)
[facility] Consult   Reason for Consult:  suicidal statements Referring Physician:  Dr. Mal Misty Patient Identification: Jimmy Olson MRN:  [id number] Principal Diagnosis: Severe recurrent major depression without psychotic features [facility]) Diagnosis:  Principal Problem:   Severe recurrent major depression without psychotic features ([place]) Active Problems:   Essential hypertension   Elevated troponin   Type II diabetes mellitus with renal manifestations ([place])   Acute on chronic HFrEF (heart failure with reduced ejection fraction) ([place])   Acute renal failure superimposed on stage 4 chronic kidney disease (HCC)   HLD (hyperlipidemia)   ATN (acute tubular necrosis) ([place])   Demand ischemia ([place])   Coronary artery disease involving native coronary artery of native heart without angina pectoris   Total Time spent with patient: 30 minutes  Subjective:   Jimmy Olson is a 60 y.o. male patient admitted with chest pain and chest tightness. Psychiatry follows for depression and anxiety.  Patient was seen today for follow-up per main treatment team request due to report that patient making suicidal statements.  HPI: Patient seen for follow-up. Patient reports "feeling better" and denies making suicidal statements earlier and denies having suicidal thoughts, plans at the time of the interview, He explained that he saw a doctor this morning and doctor told him he will prescribe multiple medications upon discharge, including medication for suicidal thoughts; patient states he said he will not take the medication for suicidal thoughts as he does not have suicidal thoughts. Patient still agreeable to take medication "for depression and anxiety". Psychotropic medications reviewed with patient; he was explained that Celexa is prescribed for depression and anxiety and suicidal thoughts like one of possible symptoms of depression. Patient expressed understanding. Patient appears to be oriented for  future; his plan is to return back to work after he recovers. He is working as a Librarian, academic at [employer]. He reports having five children and that one of his sons is especially supportive. He reports separation from wife for last 10 years, although states they are still in contact and support each other if needed.He is hopeful for positive effect from the treatment he is receiving in the hospital. He againe denied having sucvidal or homicidal thoughts, plans, intents. Denies hallucinations, did not expressed any delusions. He is agreeable to continue psychotropic medications and will accept outpatient [place] resources.  Past Psychiatric History: No previous treatment   Past Medical History:  Past Medical History:  Diagnosis Date  . Cardiomyopathy ([place])    a. 07/2009 MV: EF 48%; b. 08/2019 Echo: EF 45-50%. Global HK. Mod LVH. Gr1 DD. Nl RV fxn. Nl PASP.  Marland Kitchen Chest pain    a. 07/2009 MV: EF 48%. No ischemia.  . Diabetes mellitus without complication ([place])   . HFmrEF (heart failure with mildy reduced ejection fraction) ([place])   . Hypertension     Past Surgical History:  Procedure Laterality Date  . APPENDECTOMY    . RIGHT/LEFT HEART CATH AND CORONARY ANGIOGRAPHY N/A 07/19/2020   Procedure: RIGHT/LEFT HEART CATH AND CORONARY ANGIOGRAPHY;  Surgeon: Wellington Hampshire, MD;  Location: [facility];  Service: Cardiovascular;  Laterality: N/A;   Family History:  Family History  Problem Relation Age of Onset  . Kidney failure Mother        died @ 14  . Heart failure Mother   . Other Father        he never knew his father   Family Psychiatric  History: no h/o suicides Social History:  Social History   Substance and Sexual Activity  Alcohol Use Never     Social History   Substance and Sexual Activity  Drug Use Never    Social History   Socioeconomic History  . Marital status: Married    Spouse name: Not on file  . Number of children: Not on file  . Years of education: Not  on file  . Highest education level: Not on file  Occupational History  . Not on file  Tobacco Use  . Smoking status: Never Smoker  . Smokeless tobacco: Never Used  Substance and Sexual Activity  . Alcohol use: Never  . Drug use: Never  . Sexual activity: Not on file  Other Topics Concern  . Not on file  Social History Narrative   Lives locally with wife and son.  He is currently unemployed - has worked in different industries.     Social Determinants of Health   Financial Resource Strain: Not on file  Food Insecurity: Not on file  Transportation Needs: Not on file  Physical Activity: Not on file  Stress: Not on file  Social Connections: Not on file   Additional Social History:    Allergies:  No Known Allergies  Labs:  Results for orders placed or performed during the hospital encounter of 07/15/20 (from the past 48 hour(s))  Microalbumin / creatinine urine ratio     Status: Abnormal   Collection Time: 07/22/20  3:48 PM  Result Value Ref Range   Microalb, Ur 1,682.3 (H) Not Estab. ug/mL    Comment: (NOTE) Results confirmed on dilution.    Microalb Creat Ratio 2,485 (H) 0 - 29 mg/g creat    Comment: (NOTE)                       Normal:                0 -  29                       Moderately increased: 30 - 300                       Severely increased:       >300 Performed At: [facility] [address] [place], [name] [id number] Rush Farmer MD [id number]    Creatinine, Urine 67.7 Not Estab. mg/dL  Glucose, capillary     Status: Abnormal   Collection Time: 07/22/20  3:48 PM  Result Value Ref Range   Glucose-Capillary 284 (H) 70 - 99 mg/dL    Comment: Glucose reference range applies only to samples taken after fasting for at least 8 hours.  Glucose, capillary     Status: Abnormal   Collection Time: 07/22/20  7:36 PM  Result Value Ref Range   Glucose-Capillary 250 (H) 70 - 99 mg/dL    Comment: Glucose reference range applies only to samples taken  after fasting for at least 8 hours.  Basic metabolic panel     Status: Abnormal   Collection Time: 07/23/20  3:56 AM  Result Value Ref Range   Sodium 139 135 - 145 mmol/L   Potassium 5.0 3.5 - 5.1 mmol/L   Chloride 104 98 - 111 mmol/L   CO2 25 22 - 32 mmol/L   Glucose, Bld 162 (H) 70 - 99 mg/dL    Comment: Glucose reference range applies only to samples taken after fasting for at least 8 hours.  BUN 70 (H) 6 - 20 mg/dL   Creatinine, Ser 4.23 (H) 0.61 - 1.24 mg/dL   Calcium 8.8 (L) 8.9 - 10.3 mg/dL   GFR, Estimated 16 (L) >60 mL/min    Comment: (NOTE) Calculated using the CKD-EPI Creatinine Equation (2021)    Anion gap 10 5 - 15    Comment: Performed at [facility], [address] [place], [place] [address]  Glucose, capillary     Status: Abnormal   Collection Time: 07/23/20  7:20 AM  Result Value Ref Range   Glucose-Capillary 165 (H) 70 - 99 mg/dL    Comment: Glucose reference range applies only to samples taken after fasting for at least 8 hours.  Glucose, capillary     Status: Abnormal   Collection Time: 07/23/20  8:00 AM  Result Value Ref Range   Glucose-Capillary 175 (H) 70 - 99 mg/dL    Comment: Glucose reference range applies only to samples taken after fasting for at least 8 hours.  Glucose, capillary     Status: Abnormal   Collection Time: 07/23/20 11:02 AM  Result Value Ref Range   Glucose-Capillary 311 (H) 70 - 99 mg/dL    Comment: Glucose reference range applies only to samples taken after fasting for at least 8 hours.  Glucose, capillary     Status: Abnormal   Collection Time: 07/23/20  5:16 PM  Result Value Ref Range   Glucose-Capillary 180 (H) 70 - 99 mg/dL    Comment: Glucose reference range applies only to samples taken after fasting for at least 8 hours.  Glucose, capillary     Status: Abnormal   Collection Time: 07/23/20  9:06 PM  Result Value Ref Range   Glucose-Capillary 178 (H) 70 - 99 mg/dL    Comment: Glucose reference range applies only  to samples taken after fasting for at least 8 hours.  Basic metabolic panel     Status: Abnormal   Collection Time: 07/24/20  3:07 AM  Result Value Ref Range   Sodium 139 135 - 145 mmol/L   Potassium 4.7 3.5 - 5.1 mmol/L   Chloride 108 98 - 111 mmol/L   CO2 24 22 - 32 mmol/L   Glucose, Bld 113 (H) 70 - 99 mg/dL    Comment: Glucose reference range applies only to samples taken after fasting for at least 8 hours.   BUN 66 (H) 6 - 20 mg/dL   Creatinine, Ser 4.00 (H) 0.61 - 1.24 mg/dL   Calcium 8.6 (L) 8.9 - 10.3 mg/dL   GFR, Estimated 17 (L) >60 mL/min    Comment: (NOTE) Calculated using the CKD-EPI Creatinine Equation (2021)    Anion gap 7 5 - 15    Comment: Performed at [facility], [address] [place], [place] [address]  Glucose, capillary     Status: Abnormal   Collection Time: 07/24/20  7:36 AM  Result Value Ref Range   Glucose-Capillary 154 (H) 70 - 99 mg/dL    Comment: Glucose reference range applies only to samples taken after fasting for at least 8 hours.  Glucose, capillary     Status: Abnormal   Collection Time: 07/24/20 12:02 PM  Result Value Ref Range   Glucose-Capillary 271 (H) 70 - 99 mg/dL    Comment: Glucose reference range applies only to samples taken after fasting for at least 8 hours.    Current Facility-Administered Medications  Medication Dose Route Frequency Provider Last Rate Last Admin  . 0.9 %  sodium chloride  infusion  250 mL Intravenous PRN [name] A, MD      . acetaminophen (TYLENOL) tablet 650 mg  650 mg Oral Q6H PRN Wellington Hampshire, MD   650 mg at 07/22/20 2135  . albuterol (VENTOLIN HFA) 108 (90 Base) MCG/ACT inhaler 2 puff  2 puff Inhalation Q4H PRN Wellington Hampshire, MD      . alum & mag hydroxide-simeth (MAALOX/MYLANTA) 200-200-20 MG/5ML suspension 30 mL  30 mL Oral Q4H PRN Wellington Hampshire, MD   30 mL at 07/22/20 0906  . amLODipine (NORVASC) tablet 2.5 mg  2.5 mg Oral Daily Theora Gianotti, NP   2.5 mg at  07/24/20 0836  . aspirin EC tablet 81 mg  81 mg Oral Daily [name] A, MD   81 mg at 07/24/20 0836  . atorvastatin (LIPITOR) tablet 80 mg  80 mg Oral Daily Theora Gianotti, NP   80 mg at 07/24/20 [id number]  . bisoprolol (ZEBETA) tablet 10 mg  10 mg Oral Daily Wellington Hampshire, MD   10 mg at 07/24/20 0837  . calcium carbonate (TUMS - dosed in mg elemental calcium) chewable tablet 200 mg of elemental calcium  1 tablet Oral TID PRN Wellington Hampshire, MD   200 mg of elemental calcium at 07/16/20 2342  . citalopram (CELEXA) tablet 20 mg  20 mg Oral Daily [name] A, MD   20 mg at 07/24/20 0836  . clonazePAM (KLONOPIN) disintegrating tablet 0.25 mg  0.25 mg Oral BID Jennye Boroughs, MD   0.25 mg at 07/23/20 2247  . dextromethorphan-guaiFENesin (MUCINEX DM) 30-600 MG per 12 hr tablet 1 tablet  1 tablet Oral BID PRN Wellington Hampshire, MD   1 tablet at 07/20/20 0438  . feeding supplement (GLUCERNA SHAKE) (GLUCERNA SHAKE) liquid 237 mL  237 mL Oral BID BM [name] A, MD   237 mL at 07/24/20 0843  . heparin injection 5,000 Units  5,000 Units Subcutaneous Q8H Wellington Hampshire, MD   5,000 Units at 07/24/20 [phone]  . hydrALAZINE (APRESOLINE) tablet 100 mg  100 mg Oral BID [name] A, MD   100 mg at 07/24/20 [id number]  . insulin aspart (novoLOG) injection 0-5 Units  0-5 Units Subcutaneous QHS Wellington Hampshire, MD   2 Units at 07/22/20 2134  . insulin aspart (novoLOG) injection 0-9 Units  0-9 Units Subcutaneous TID WC Wellington Hampshire, MD   2 Units at 07/24/20 [phone]  . insulin detemir (LEVEMIR) injection 25 Units  25 Units Subcutaneous Daily Fritzi Mandes, MD   25 Units at 07/24/20 [phone]  . isosorbide mononitrate (IMDUR) 24 hr tablet 60 mg  60 mg Oral Daily Theora Gianotti, NP   60 mg at 07/24/20 0836  . morphine 2 MG/ML injection 2 mg  2 mg Intravenous Q2H PRN Athena Masse, MD   2 mg at 07/22/20 0228  . multivitamin with minerals tablet 1 tablet  1 tablet Oral Daily [name] A, MD   1 tablet at 07/24/20 0837  . nitroGLYCERIN (NITROSTAT) SL tablet 0.4 mg  0.4 mg Sublingual Q5 min PRN Wellington Hampshire, MD   0.4 mg at 07/22/20 0208  . ondansetron (ZOFRAN) injection 4 mg  4 mg Intravenous Q6H PRN Wellington Hampshire, MD   4 mg at 07/16/20 1222  . ondansetron (ZOFRAN) tablet 4 mg  4 mg Oral Q6H PRN Wellington Hampshire, MD   4 mg at 07/20/20 1544  . pantoprazole (PROTONIX) EC  tablet 40 mg  40 mg Oral Daily [name] A, MD   40 mg at 07/24/20 0836  . sodium chloride flush (NS) 0.9 % injection 3 mL  3 mL Intravenous Q12H Wellington Hampshire, MD   3 mL at 07/24/20 [id number]  . sodium chloride flush (NS) 0.9 % injection 3 mL  3 mL Intravenous PRN [name] A, MD   3 mL at 07/23/20 0850  . zolpidem (AMBIEN) tablet 10 mg  10 mg Oral QHS PRN Clapacs, Madie Reno, MD         Psychiatric Specialty Exam: Physical Exam  Review of Systems  Blood pressure (!) 118/58, pulse 66, temperature 98.8 F (37.1 C), temperature source Oral, resp. rate 18, height '5\' 5"'$  (1.651 m), weight 68.9 kg, SpO2 97 %.Body mass index is 25.28 kg/m.  General Appearance: Casual  Eye Contact:  Good  Speech:  Clear and Coherent and Normal Rate  Volume:  Normal  Mood:  Euthymic  Affect:  Appropriate and Congruent  Thought Process:  Coherent, Goal Directed and Linear  Orientation:  Full (Time, Place, and Person)  Thought Content:  Logical  Suicidal Thoughts:  No  Homicidal Thoughts:  No  Memory:  Immediate;   Fair Recent;   Fair Remote;   Fair  Judgement:  Fair  Insight:  Fair  Psychomotor Activity:  Normal  Concentration:  Concentration: Fair and Attention Span: Fair  Recall:  [employer] of Knowledge:  Fair  Language:  Fair  Akathisia:  No  Handed:  Right  AIMS (if indicated):     Assets:  Communication Skills Desire for Improvement Financial Resources/Insurance Resilience Social Support  ADL's:  Intact  Cognition:  WNL  Sleep:        Treatment Plan  Summary: ASSESSMENT: Patient is seen and examined.  Patient is a 60 year old male who was admitted on Medicine service secondary to chest pain. Psychiatry is following for depression and anxiety. During my assessment, pt is calm, cooperative, and logical. Patient denies suicidal, homicidal thoughts,  hallucinations and paranoia; patient does not appear to be psychotic, gravely disabled or at acute risk of harming self or others. Patient does not meet criteria for an inpatient psychiatric admission at this time.  IMPRESSION: Adjustment disorder with depressed mood.   RECOMMENDATIONS: -No indication for inpatient psychiatric hospitalization. -No indication for sitter at this time  -continue current psych medications: Citalopram '20mg'$  PO daily for depression and anxiety Clonazepam 0.'25mg'$  PO BID for anxiety.  -SW to provide a patient with a list of outpatient mental health resources  -Relayed above to the primary team (Epic message sent)  -Psychiatry will follow as needed.   Disposition: No evidence of imminent risk to self or others at present.   Patient does not meet criteria for psychiatric inpatient admission.  Larita Fife, MD 07/24/2020 1:36 PM

## 2020-07-24 NOTE — Progress Notes (Signed)
[place], [name] [address] 07/24/20  Subjective:   Hospital day # 9 Patient appears comfortable in bed, denies chest pain or shortness of breath.  He has complaints of chronic epigastric pain, which he reports as worse recently Creatinine gradually improving.  Renal: 02/04 0701 - 02/05 0700 In: 240 [P.O.:240] Out: 1500 [Urine:1500] Lab Results  Component Value Date   CREATININE 4.00 (H) 07/24/2020   CREATININE 4.23 (H) 07/23/2020   CREATININE 4.38 (H) 07/22/2020     Objective:  Vital signs in last 24 hours:  Temp:  [97.8 F (36.6 C)-98.8 F (37.1 C)] 98.8 F (37.1 C) (02/05 1157) Pulse Rate:  [64-67] 66 (02/05 1157) Resp:  [17-19] 18 (02/05 1157) BP: (118-156)/(58-83) 118/58 (02/05 1157) SpO2:  [96 %-99 %] 97 % (02/05 1157) Weight:  [68.9 kg] 68.9 kg (02/05 0329)  Weight change: -3.311 kg Filed Weights   07/22/20 0429 07/23/20 0336 07/24/20 0329  Weight: 74.3 kg 72.2 kg 68.9 kg    Intake/Output:    Intake/Output Summary (Last 24 hours) at 07/24/2020 1440 Last data filed at 07/24/2020 1203 Gross per 24 hour  Intake 240 ml  Output 1100 ml  Net -860 ml    Physical Exam: General:  Appears slightly anxious  HEENT  normocephalic, atraumatic  Pulm/lungs  lungs clear to auscultation bilaterally, respirations symmetrical and unlabored  CVS/Heart  S1-S2, no rubs or gallops  Abdomen:   Soft, tender, non distended,epigastric tenderness +  Extremities:  No peripheral edema  Neurologic:  Oriented x3  Skin:  No acute rashes or lesions     Basic Metabolic Panel:  Recent Labs  Lab 07/18/20 0552 07/19/20 0152 07/20/20 0443 07/21/20 0837 07/22/20 0350 07/23/20 0356 07/24/20 0307  NA 140   < > 140 137 142 139 139  K 4.0   < > 4.7 4.7 5.0 5.0 4.7  CL 105   < > 106 104 106 104 108  CO2 25   < > '26 23 28 25 24  '$ GLUCOSE 160*   < > 126* 193* 200* 162* 113*  BUN 55*   < > 68* 73* 74* 70* 66*  CREATININE 3.25*   < > 3.85* 4.70* 4.38*  4.23* 4.00*  CALCIUM 8.9   < > 8.5* 8.6* 8.6* 8.8* 8.6*  PHOS 4.3  --   --   --   --   --   --    < > = values in this interval not displayed.     CBC: Recent Labs  Lab 07/18/20 0552 07/19/20 0152 07/20/20 0443  WBC 8.2 6.6 5.6  HGB 11.8* 10.9* 9.5*  HCT 34.5* 31.6* 29.1*  MCV 88.0 87.5 89.5  PLT 216 219 200      Lab Results  Component Value Date   HEPBSAG NON REACTIVE 07/22/2020   HEPBSAB NON REACTIVE 07/22/2020      Microbiology:  Recent Results (from the past 240 hour(s))  SARS Coronavirus 2 by RT PCR (hospital order, performed in [facility] hospital lab) Nasopharyngeal Nasopharyngeal Swab     Status: None   Collection Time: 07/15/20  3:59 AM   Specimen: Nasopharyngeal Swab  Result Value Ref Range Status   SARS Coronavirus 2 NEGATIVE NEGATIVE Final    Comment: (NOTE) SARS-CoV-2 target nucleic acids are NOT DETECTED.  The SARS-CoV-2 RNA is generally detectable in upper and lower respiratory specimens during the acute phase of infection. The lowest concentration of SARS-CoV-2 viral copies this assay can detect is 250 copies / mL.  A negative result does not preclude SARS-CoV-2 infection and should not be used as the sole basis for treatment or other patient management decisions.  A negative result may occur with improper specimen collection / handling, submission of specimen other than nasopharyngeal swab, presence of viral mutation(s) within the areas targeted by this assay, and inadequate number of viral copies (<250 copies / mL). A negative result must be combined with clinical observations, patient history, and epidemiological information.  Fact Sheet for Patients:   StrictlyIdeas.no  Fact Sheet for Healthcare Providers: BankingDealers.co.za  This test is not yet approved or  cleared by the [place] FDA and has been authorized for detection and/or diagnosis of SARS-CoV-2 by FDA under an Emergency Use  Authorization (EUA).  This EUA will remain in effect (meaning this test can be used) for the duration of the COVID-19 declaration under Section 564(b)(1) of the Act, 21 U.S.C. section 360bbb-3(b)(1), unless the authorization is terminated or revoked sooner.  Performed at [facility], [address] [place], [place] [address]   Culture, blood (single)     Status: None   Collection Time: 07/15/20  3:59 AM   Specimen: BLOOD  Result Value Ref Range Status   Specimen Description BLOOD RIGHT FA  Final   Special Requests   Final    BOTTLES DRAWN AEROBIC AND ANAEROBIC Blood Culture results may not be optimal due to an excessive volume of blood received in culture bottles   Culture   Final    NO GROWTH 5 DAYS Performed at [facility], [address], [place], [place] [address]    Report Status 07/20/2020 FINAL  Final    Coagulation Studies: No results for input(s): LABPROT, INR in the last 72 hours.  Urinalysis: No results for input(s): COLORURINE, LABSPEC, PHURINE, GLUCOSEU, HGBUR, BILIRUBINUR, KETONESUR, PROTEINUR, UROBILINOGEN, NITRITE, LEUKOCYTESUR in the last 72 hours.  Invalid input(s): APPERANCEUR    Imaging: No results found.   Medications:   . sodium chloride     . amLODipine  2.5 mg Oral Daily  . aspirin EC  81 mg Oral Daily  . atorvastatin  80 mg Oral Daily  . bisoprolol  10 mg Oral Daily  . citalopram  20 mg Oral Daily  . clonazepam  0.25 mg Oral BID  . feeding supplement (GLUCERNA SHAKE)  237 mL Oral BID BM  . heparin  5,000 Units Subcutaneous Q8H  . hydrALAZINE  100 mg Oral BID  . insulin aspart  0-5 Units Subcutaneous QHS  . insulin aspart  0-9 Units Subcutaneous TID WC  . insulin detemir  25 Units Subcutaneous Daily  . isosorbide mononitrate  60 mg Oral Daily  . multivitamin with minerals  1 tablet Oral Daily  . pantoprazole  40 mg Oral Daily  . sodium chloride flush  3 mL Intravenous Q12H   sodium chloride, acetaminophen,  albuterol, alum & mag hydroxide-simeth, calcium carbonate, dextromethorphan-guaiFENesin, morphine injection, nitroGLYCERIN, ondansetron (ZOFRAN) IV, ondansetron, sodium chloride flush, zolpidem  Assessment/ Plan:  60 y.o. male with diabetes, hypertension, CHF  admitted on 07/15/2020 for SOB (shortness of breath) [R06.02] Elevated troponin [R77.8] Acute CHF (congestive heart failure) (HCC) [I50.9] Acute on chronic systolic congestive heart failure (HCC) [I50.23] Acute on chronic systolic CHF (congestive heart failure) ([place]) [I50.23]  07/16/2020: 2D echo: LVEF 30 to 35%, global hypokinesis, moderately dilated left ventricular internal cavity, grade 2 diastolic dysfunction  #Acute kidney injury Likely secondary to ATN/Cardiorenal syndrome, IV contrast exposure 02/04 0701 - 02/05 0700 In: 240 [P.O.:240]  Out: 1500 [Urine:1500] Lab Results  Component Value Date   CREATININE 4.00 (H) 07/24/2020   CREATININE 4.23 (H) 07/23/2020   CREATININE 4.38 (H) 07/22/2020   Creatinine gradually and progressively improving Urine output 1500 mL for the preceding 24 hours No acute indication for dialysis  We will continue monitoring closely Instructed to increase oral fluid intake  #Chronic kidney disease stage IV Baseline creatinine 2.96/GFR 24 from July 16, 2020 CKD likely secondary to diabetes, hypertension and atherosclerosis  #Non-STEMI Patient denies shortness of breath or chest pain Cardiac cath during this admission revealed moderate two vessel CAD Cardiology team managing care  #Type 2 diabetes with CKD Lab Results  Component Value Date   HGBA1C 9.7 (H) 07/15/2020  Currently on insulin aspart and insulin detemir   LOS: 9 Jobe Mutch 2/5/20222:40 PM  [place]  Note: This note was prepared with Dragon dictation. Any transcription errors are unintentional

## 2020-07-25 LAB — GLUCOSE, CAPILLARY
Glucose-Capillary: 96 mg/dL (ref 70–99)
Glucose-Capillary: 247 mg/dL — ABNORMAL HIGH (ref 70–99)

## 2020-07-25 LAB — BASIC METABOLIC PANEL
Anion gap: 7 (ref 5–15)
BUN: 60 mg/dL — ABNORMAL HIGH (ref 6–20)
CO2: 24 mmol/L (ref 22–32)
Calcium: 8.5 mg/dL — ABNORMAL LOW (ref 8.9–10.3)
Chloride: 105 mmol/L (ref 98–111)
Creatinine, Ser: 3.73 mg/dL — ABNORMAL HIGH (ref 0.61–1.24)
GFR, Estimated: 18 mL/min — ABNORMAL LOW (ref >60)
Glucose, Bld: 245 mg/dL — ABNORMAL HIGH (ref 70–99)
Potassium: 5.3 mmol/L — ABNORMAL HIGH (ref 3.5–5.1)
Sodium: 136 mmol/L (ref 135–145)

## 2020-07-25 MED ORDER — CITALOPRAM HYDROBROMIDE 20 MG PO TABS: 20.0000 mg | ORAL_TABLET | Freq: Every day | ORAL | 0 refills | Status: DC

## 2020-07-25 MED ORDER — BISOPROLOL FUMARATE 10 MG PO TABS: 10.0000 mg | ORAL_TABLET | Freq: Every day | ORAL | 0 refills | Status: DC

## 2020-07-25 MED ORDER — ROSUVASTATIN CALCIUM 40 MG PO TABS: 40.0000 mg | ORAL_TABLET | Freq: Every day | ORAL | 0 refills | Status: DC

## 2020-07-25 MED ORDER — INSULIN DETEMIR 100 UNIT/ML FLEXPEN: 25.0000 [IU] | PEN_INJECTOR | Freq: Every day | SUBCUTANEOUS | Status: DC

## 2020-07-25 MED ORDER — NITROGLYCERIN 0.4 MG SL SUBL: 0.4000 mg | SUBLINGUAL_TABLET | SUBLINGUAL | 0 refills | Status: DC | PRN

## 2020-07-25 MED ORDER — ISOSORBIDE MONONITRATE ER 60 MG PO TB24: 60.0000 mg | ORAL_TABLET | Freq: Every day | ORAL | 0 refills | Status: DC

## 2020-07-25 MED ORDER — CLONAZEPAM 0.25 MG PO TBDP: 0.2500 mg | ORAL_TABLET | Freq: Two times a day (BID) | ORAL | 0 refills | Status: DC | PRN

## 2020-07-25 MED ORDER — HYDRALAZINE HCL 100 MG PO TABS: 100.0000 mg | ORAL_TABLET | Freq: Two times a day (BID) | ORAL | 0 refills | Status: DC

## 2020-07-25 MED ORDER — AMLODIPINE BESYLATE 2.5 MG PO TABS: 2.5000 mg | ORAL_TABLET | Freq: Every day | ORAL | 0 refills | Status: DC

## 2020-07-25 MED ORDER — ASPIRIN 81 MG PO TBEC: 81.0000 mg | DELAYED_RELEASE_TABLET | Freq: Every day | ORAL | Status: DC

## 2020-07-25 MED ORDER — SODIUM ZIRCONIUM CYCLOSILICATE 10 G PO PACK
10.0000 g | PACK | Freq: Once | ORAL | Status: DC
  Filled 2020-07-25: qty 1

## 2020-07-25 NOTE — Discharge Summary (Signed)
Physician Discharge Summary  Jimmy Olson [id number] DOB: Apr 10, 1961 DOA: 07/15/2020  PCP: [facility] [place] [facility] date: 07/15/2020 Discharge date: 07/25/2020  Discharge disposition: Home   Recommendations for Outpatient Follow-Up:   Follow-up with PCP in 1 week Follow-up with nephrologist in 1 week Follow-up with cardiologist in 3 weeks   Discharge Diagnosis:   Principal Problem:   Severe recurrent major depression without psychotic features ([place]) Active Problems:   Essential hypertension   Elevated troponin   Type II diabetes mellitus with renal manifestations (HCC)   Acute on chronic HFrEF (heart failure with reduced ejection fraction) (HCC)   Acute renal failure superimposed on stage 4 chronic kidney disease (HCC)   HLD (hyperlipidemia)   ATN (acute tubular necrosis) (HCC)   Demand ischemia (HCC)   Coronary artery disease involving native coronary artery of native heart without angina pectoris    Discharge Condition: Stable.  Diet recommendation:  Diet Order            Diet - low sodium heart healthy           Diet heart healthy/carb modified Room service appropriate? Yes; Fluid consistency: Thin  Diet effective now                   Code Status: Full Code     Hospital Course:   Mr. Jimmy Olson is a 60 y.o. male with medical history significant ofsCHF withEF of 45 to 50%, hypertension, hyperlipidemia, diabetes mellitus, COVID-19 infection 4 months ago, CKD IV, who presented to the hospital with chest pain and chest tightness.  He was admitted to the hospital for acute on chronic systolic and diastolic CHF complicated by acute hypoxic respiratory failure.  He was initially treated with IV heparin infusion because of concern for acute NSTEMI.  He underwent left and right heart cath there was no evidence of critical stenosis. NSTEMI was ruled out. Elevated troponins were attributed to demand ischemia.  He was treated with IV  Lasix and oxygen.  He developed acute kidney injury on CKD stage IV.  Lasix was held.  He had multiple risk factors for AKI including cardiorenal syndrome and IV contrast exposure.  Initially, there was consideration for hemodialysis.  However, plan for hemodialysis was aborted because his creatinine slowly improved.  He was evaluated by the psychiatrist because of anxiety and depression with suicidal ideation.  He was started on Celexa and clonazepam. He later denied any suicidal ideation.  On the day of discharge, I spoke to the patient about his diagnoses including depression and anxiety and also discussed the plan of care.  Spanish interpreter via iPad was used for this encounter.  Lysbeth Galas, his nurse, was also present at the bedside during this encounter.  Patient said that he was not suicidal and he was not depressed.  He said he had separated from his wife and he has 5 children who are not supportive.  He said he did not have any family support.  He said that when he looked on the camera in his house, he noticed that his wife, his son and daughter were in the house but nobody had reached out to him.  He said he had lost his job because of his prolonged absence from work (because of current hospitalization).  However, he said he was determined to recover from his illness and that he wanted to get better that is why he came to the hospital.  Of note, patient was evaluated  by the psychiatrist, Dr. Danella Sensing, on 07/24/2020 and patient was cleared for discharge from the psychiatric standpoint because he said he was not suicidal.  His condition has improved.  His creatinine continues to trend down.  He was given  [facility] for slightly elevated potassium at 5.3 prior to discharge.  Case was discussed with nephrologist, Dr. Holley Raring, and from renal standpoint, patient is okay for discharge.     Medical Consultants:    Cardiologist  Nephrologist  Psychiatrist   Discharge Exam:    Vitals:   07/24/20 2118  07/25/20 0441 07/25/20 0744 07/25/20 1121  BP: 137/75 (!) 143/76 (!) 141/77 119/68  Pulse: 67 67 66 63  Resp: '18 18 18 18  '$ Temp: (!) 97.3 F (36.3 C) 97.8 F (36.6 C) 97.8 F (36.6 C) 97.7 F (36.5 C)  TempSrc:  Oral Oral   SpO2: 98% 99% 98% 96%  Weight:  71.1 kg    Height:         GEN: NAD SKIN: No rash EYES: EOMI ENT: MMM CV: RRR PULM: CTA B ABD: soft, ND, NT, +BS CNS: AAO x 3, non focal EXT: No edema or tenderness PSYCH: Mood is normal.  Calm and cooperative.  No aggressive behavior.  No delusions or hallucinations   The results of significant diagnostics from this hospitalization (including imaging, microbiology, ancillary and laboratory) are listed below for reference.     Procedures and Diagnostic Studies:   DG Chest 1 View  Result Date: 07/17/2020 CLINICAL DATA:  60 year old male with a history of acute tubular necrosis EXAM: CHEST  1 VIEW COMPARISON:  07/15/2020 FINDINGS: Cardiomediastinal silhouette unchanged in size and contour. Low lung volumes with slight improved aeration at the lung bases. No pneumothorax or pleural effusion. IMPRESSION: Slight improved airspace opacity at the right lung base with persisting low lung volumes. Electronically Signed   By: Corrie Mckusick D.O.   On: 07/17/2020 12:43   US RENAL  Result Date: 07/17/2020 CLINICAL DATA:  Acute tubular necrosis, history diabetes mellitus, hypertension, cardiomyopathy EXAM: RENAL / URINARY TRACT ULTRASOUND COMPLETE COMPARISON:  CT abdomen and pelvis 02/01/2007 FINDINGS: Right Kidney: Renal measurements: 11.1 x 5.5 x 5.2 cm = volume: 167 mL. Normal cortical thickness. Increased cortical echogenicity. No mass, hydronephrosis, or shadowing calcification. Left Kidney: Renal measurements: 10.5 x 5.8 x 7.3 cm = volume: 234 mL. Normal cortical thickness. Increased cortical echogenicity. No mass, hydronephrosis, or shadowing calcification. Bladder: Appears normal for degree of bladder distention. Other: Prostate  gland enlarged 5.1 x 4.2 x 5.3 cm (volume = 59 cm^3) IMPRESSION: Medical renal disease changes of both kidneys. No evidence of renal mass or hydronephrosis. Prostatic enlargement. Electronically Signed   By: Lavonia Dana M.D.   On: 07/17/2020 17:02   ECHOCARDIOGRAM COMPLETE  Result Date: 07/16/2020    ECHOCARDIOGRAM REPORT   Patient Name:   Jimmy Olson Date of Exam: 07/16/2020 Medical Rec #:  [id number]     Height:       65.0 in Accession #:    [id number]    Weight:       154.8 lb Date of Birth:  [birth year]     BSA:          1.774 m Patient Age:    83 years      BP:           152/92 mmHg Patient Gender: M             HR:  69 bpm. Exam Location:  [facility] Procedure: 2D Echo, Color Doppler, Cardiac Doppler and Strain Analysis Indications:     [id number] CHF-Acute Systolic  History:         Patient has prior history of Echocardiogram examinations, most                  recent 09/11/2019. HFpEF and Cardiomyopathy; Risk                  Factors:Hypertension and Diabetes. Pt had COVID-19 01/2020.  Sonographer:     Charmayne Sheer RDCS (AE) Referring Phys:  [id number] Ivor Costa Diagnosing Phys: Ida Rogue MD  Sonographer Comments: Global longitudinal strain was attempted. IMPRESSIONS  1. Left ventricular ejection fraction, by estimation, is 30 to 35%. The left ventricle has moderately decreased function. The left ventricle demonstrates global hypokinesis. The left ventricular internal cavity size was moderately dilated. There is mild  left ventricular hypertrophy. Left ventricular diastolic parameters are consistent with Grade II diastolic dysfunction (pseudonormalization). The average left ventricular global longitudinal strain is -10.1 %. The global longitudinal strain is abnormal.  2. Right ventricular systolic function is normal. The right ventricular size is normal. Tricuspid regurgitation signal is inadequate for assessing PA pressure.  3. Left atrial size was mildly dilated.  4. The inferior vena cava is normal in size  with greater than 50% respiratory variability, suggesting right atrial pressure of 3 mmHg. FINDINGS  Left Ventricle: Left ventricular ejection fraction, by estimation, is 30 to 35%. The left ventricle has moderately decreased function. The left ventricle demonstrates global hypokinesis. The average left ventricular global longitudinal strain is -10.1 %. The global longitudinal strain is abnormal. The left ventricular internal cavity size was moderately dilated. There is mild left ventricular hypertrophy. Left ventricular diastolic parameters are consistent with Grade II diastolic dysfunction (pseudonormalization). Right Ventricle: The right ventricular size is normal. No increase in right ventricular wall thickness. Right ventricular systolic function is normal. Tricuspid regurgitation signal is inadequate for assessing PA pressure. Left Atrium: Left atrial size was mildly dilated. Right Atrium: Right atrial size was normal in size. Pericardium: There is no evidence of pericardial effusion. Mitral Valve: The mitral valve is normal in structure. Mild mitral valve regurgitation. No evidence of mitral valve stenosis. MV peak gradient, 2.8 mmHg. The mean mitral valve gradient is 2.0 mmHg. Tricuspid Valve: The tricuspid valve is normal in structure. Tricuspid valve regurgitation is not demonstrated. No evidence of tricuspid stenosis. Aortic Valve: The aortic valve was not well visualized. Aortic valve regurgitation is not visualized. No aortic stenosis is present. Aortic valve mean gradient measures 3.0 mmHg. Aortic valve peak gradient measures 5.1 mmHg. Aortic valve area, by VTI measures 2.34 cm. Pulmonic Valve: The pulmonic valve was normal in structure. Pulmonic valve regurgitation is not visualized. No evidence of pulmonic stenosis. Aorta: The aortic root is normal in size and structure. Venous: The inferior vena cava is normal in size with greater than 50% respiratory variability, suggesting right atrial pressure  of 3 mmHg. IAS/Shunts: No atrial level shunt detected by color flow Doppler.  LEFT VENTRICLE PLAX 2D LVIDd:         5.50 cm      Diastology LVIDs:         4.50 cm      LV e' medial:    4.35 cm/s LV PW:         1.20 cm      LV E/e' medial:  19.8 LV IVS:  0.70 cm      LV e' lateral:   5.55 cm/s LVOT diam:     2.20 cm      LV E/e' lateral: 15.5 LV SV:         54 LV SV Index:   30           2D Longitudinal Strain LVOT Area:     3.80 cm     2D Strain GLS (A2C):   -10.5 %                             2D Strain GLS (A3C):   -10.4 %                             2D Strain GLS (A4C):   -9.3 % LV Volumes (MOD)            2D Strain GLS Avg:     -10.1 % LV vol d, MOD A2C: 170.0 ml LV vol d, MOD A4C: 169.0 ml LV vol s, MOD A2C: 103.0 ml LV vol s, MOD A4C: 101.0 ml LV SV MOD A2C:     67.0 ml LV SV MOD A4C:     169.0 ml LV SV MOD BP:      69.6 ml LEFT ATRIUM           Index LA diam:      4.00 cm 2.25 cm/m LA Vol (A4C): 47.0 ml 26.50 ml/m  AORTIC VALVE                   PULMONIC VALVE AV Area (Vmax):    2.37 cm    PV Vmax:       0.63 m/s AV Area (Vmean):   2.48 cm    PV Vmean:      44.500 cm/s AV Area (VTI):     2.34 cm    PV VTI:        0.124 m AV Vmax:           113.00 cm/s PV Peak grad:  1.6 mmHg AV Vmean:          79.000 cm/s PV Mean grad:  1.0 mmHg AV VTI:            0.229 m AV Peak Grad:      5.1 mmHg AV Mean Grad:      3.0 mmHg LVOT Vmax:         70.50 cm/s LVOT Vmean:        51.600 cm/s LVOT VTI:          0.141 m LVOT/AV VTI ratio: 0.62  AORTA Ao Root diam: 3.00 cm MITRAL VALVE MV Area (PHT): 3.20 cm    SHUNTS MV Area VTI:   2.07 cm    Systemic VTI:  0.14 m MV Peak grad:  2.8 mmHg    Systemic Diam: 2.20 cm MV Mean grad:  2.0 mmHg MV Vmax:       0.84 m/s MV Vmean:      61.0 cm/s MV Decel Time: 237 msec MV E velocity: 86.10 cm/s MV A velocity: 60.40 cm/s MV E/A ratio:  1.43 Ida Rogue MD Electronically signed by Ida Rogue MD Signature Date/Time: 07/16/2020/11:09:56 AM    Final      Labs:   Basic  Metabolic Panel: Recent Labs  Lab 07/21/20 [id number] 07/22/20 0350 07/23/20 0356 07/24/20 0307 07/25/20  1129  NA 137 142 139 139 136  K 4.7 5.0 5.0 4.7 5.3*  CL 104 106 104 108 105  CO2 '23 28 25 24 24  '$ GLUCOSE 193* 200* 162* 113* 245*  BUN 73* 74* 70* 66* 60*  CREATININE 4.70* 4.38* 4.23* 4.00* 3.73*  CALCIUM 8.6* 8.6* 8.8* 8.6* 8.5*   GFR Estimated Creatinine Clearance: 19 mL/min (A) (by C-G formula based on SCr of 3.73 mg/dL (H)). Liver Function Tests: No results for input(s): AST, ALT, ALKPHOS, BILITOT, PROT, ALBUMIN in the last 168 hours. No results for input(s): LIPASE, AMYLASE in the last 168 hours. No results for input(s): AMMONIA in the last 168 hours. Coagulation profile No results for input(s): INR, PROTIME in the last 168 hours.  CBC: Recent Labs  Lab 07/19/20 0152 07/20/20 0443  WBC 6.6 5.6  HGB 10.9* 9.5*  HCT 31.6* 29.1*  MCV 87.5 89.5  PLT 219 200   Cardiac Enzymes: No results for input(s): CKTOTAL, CKMB, CKMBINDEX, TROPONINI in the last 168 hours. BNP: Invalid input(s): POCBNP CBG: Recent Labs  Lab 07/24/20 1645 07/24/20 1915 07/24/20 2118 07/25/20 0743 07/25/20 1120  GLUCAP 159* 201* 203* 96 247*   D-Dimer No results for input(s): DDIMER in the last 72 hours. Hgb A1c No results for input(s): HGBA1C in the last 72 hours. Lipid Profile No results for input(s): CHOL, HDL, LDLCALC, TRIG, CHOLHDL, LDLDIRECT in the last 72 hours. Thyroid function studies No results for input(s): TSH, T4TOTAL, T3FREE, THYROIDAB in the last 72 hours.  Invalid input(s): FREET3 Anemia work up No results for input(s): VITAMINB12, FOLATE, FERRITIN, TIBC, IRON, RETICCTPCT in the last 72 hours. Microbiology No results found for this or any previous visit (from the past 240 hour(s)).   Discharge Instructions:   Discharge Instructions    AMB Referral to Cardiac Rehabilitation - Phase II   Complete by: As directed    Diagnosis: NSTEMI   After initial evaluation and  assessments completed: Virtual Based Care may be provided alone or in conjunction with Phase 2 Cardiac Rehab based on patient barriers.: Yes   Diet - low sodium heart healthy   Complete by: As directed    Discharge instructions   Complete by: As directed    You can follow-up with your nephrologist with [facility] or you can follow-up with Dr. Murlean Iba in 1 week.   Increase activity slowly   Complete by: As directed      Allergies as of 07/25/2020   No Known Allergies     Medication List    STOP taking these medications   lisinopril 30 MG tablet Commonly known as: ZESTRIL     TAKE these medications   amLODipine 2.5 MG tablet Commonly known as: NORVASC Take 1 tablet (2.5 mg total) by mouth daily. What changed:   medication strength  how much to take   aspirin 81 MG EC tablet Take 1 tablet (81 mg total) by mouth daily. Swallow whole. Start taking on: July 26, 2020   bisoprolol 10 MG tablet Commonly known as: ZEBETA Take 1 tablet (10 mg total) by mouth daily.   citalopram 20 MG tablet Commonly known as: CELEXA Take 1 tablet (20 mg total) by mouth daily. Start taking on: July 26, 2020   clonazePAM 0.25 MG disintegrating tablet Commonly known as: KLONOPIN Take 1 tablet (0.25 mg total) by mouth 2 (two) times daily as needed (anxiety).   hydrALAZINE 100 MG tablet Commonly known as: APRESOLINE Take 1 tablet (100 mg total) by mouth  2 (two) times daily. What changed:   medication strength  how much to take  when to take this   insulin detemir 100 UNIT/ML FlexPen Commonly known as: LEVEMIR Inject 25 Units into the skin daily. What changed:   how much to take  when to take this   insulin lispro 100 UNIT/ML KwikPen Commonly known as: HUMALOG Inject 0.03 mLs (3 Units total) into the skin 3 (three) times daily with meals. Do not take if you do not eat more than 50% of meal What changed: how much to take   isosorbide mononitrate 60 MG 24 hr  tablet Commonly known as: IMDUR Take 1 tablet (60 mg total) by mouth daily. What changed:   medication strength  how much to take   nitroGLYCERIN 0.4 MG SL tablet Commonly known as: NITROSTAT Place 1 tablet (0.4 mg total) under the tongue every 5 (five) minutes as needed for chest pain.   rosuvastatin 40 MG tablet Commonly known as: CRESTOR Take 1 tablet (40 mg total) by mouth daily. What changed:   medication strength  how much to take   Vitamin D (Ergocalciferol) 1.25 MG (50000 UNIT) Caps capsule Commonly known as: DRISDOL Take 50,000 Units by mouth every Tuesday.       Follow-up Information    [facility] Follow up on 08/02/2020.   Specialty: Cardiology Why: at [facility] through the [place] entrance Contact information: [address] [place] [phone]       Loel Dubonnet, NP Follow up in 3 week(s).   Specialty: Cardiology Contact information: [address] [place] [address] [phone]        Murlean Iba, MD. Schedule an appointment as soon as possible for a visit in 1 week(s).   Specialty: Nephrology Contact information: [place] [address] [phone]                Time coordinating discharge: 40 minutes  Signed:  [facility] 07/25/2020, 3:28 PM   Pager on [email or web address]. If 7PM-7AM, please contact night-coverage at [email or web address]

## 2020-07-25 NOTE — Progress Notes (Addendum)
Discharge education completed at bedside with assistance from interpretor. Patient's questioned were all answered. MD Mal Misty made aware patient's disability paperwork needs to be completed and his place of employment requires note from hospital.  Currently waiting for patient's wife to arrive with his clothes and take him home.

## 2020-07-25 NOTE — Progress Notes (Signed)
MD Ayiku at bedside with myself. Conversation with patient with assistance from [place]. MD asked patient about suicidal ideation; patient denies any SI at this time. MD asked patient about family support and job security. Patient relayed via interpretor he has no family support but is able to return to his home and he had a job when he was admitted to hospital but has since then been fired due to absence from work. Patient was educated by MD about medications for depression he would be prescribed at time of discharge. Patient is agreeable to take prescribed medications at this time. MD explained plan of care to patient in order for discharge as his kidney function being monitored and if at appropriate level, he can go home. Patient was agreeable with the plan at this time.  Labs pending.

## 2020-07-25 NOTE — Progress Notes (Signed)
[place], [name] [address] 07/25/20  Subjective:   Hospital day # 10 Patient denies chest pain, shortness of breath, nausea or vomiting, still has complaints of epigastric tenderness.  Appetite fair. Renal function improving, urine output adequate.  Renal: 02/05 0701 - 02/06 0700 In: 360 [P.O.:360] Out: 1100 [Urine:1100] Lab Results  Component Value Date   CREATININE 3.73 (H) 07/25/2020   CREATININE 4.00 (H) 07/24/2020   CREATININE 4.23 (H) 07/23/2020     Objective:  Vital signs in last 24 hours:  Temp:  [97.3 F (36.3 C)-97.8 F (36.6 C)] 97.7 F (36.5 C) (02/06 1121) Pulse Rate:  [63-67] 63 (02/06 1121) Resp:  [18] 18 (02/06 1121) BP: (119-150)/(68-80) 119/68 (02/06 1121) SpO2:  [96 %-99 %] 96 % (02/06 1121) Weight:  [71.1 kg] 71.1 kg (02/06 0441)  Weight change: 2.177 kg Filed Weights   07/23/20 0336 07/24/20 0329 07/25/20 0441  Weight: 72.2 kg 68.9 kg 71.1 kg    Intake/Output:    Intake/Output Summary (Last 24 hours) at 07/25/2020 1429 Last data filed at 07/25/2020 1332 Gross per 24 hour  Intake 600 ml  Output 2000 ml  Net -1400 ml    Physical Exam: General:  Flat affect, in no acute distress  HEENT  sclerae and conjunctivae clear, oral mucous membranes moist  Pulm/lungs  Respirations even, unlabored, lungs clear  CVS/Heart  S1-S2, no rubs or gallops  Abdomen:   Epigastric tenderness +  Extremities:  No peripheral edema  Neurologic:  Awake, alert, oriented  Skin:  No acute rashes or lesions     Basic Metabolic Panel:  Recent Labs  Lab 07/21/20 0837 07/22/20 0350 07/23/20 0356 07/24/20 0307 07/25/20 1129  NA 137 142 139 139 136  K 4.7 5.0 5.0 4.7 5.3*  CL 104 106 104 108 105  CO2 '23 28 25 24 24  '$ GLUCOSE 193* 200* 162* 113* 245*  BUN 73* 74* 70* 66* 60*  CREATININE 4.70* 4.38* 4.23* 4.00* 3.73*  CALCIUM 8.6* 8.6* 8.8* 8.6* 8.5*     CBC: Recent Labs  Lab 07/19/20 0152 07/20/20 0443  WBC 6.6 5.6  HGB  10.9* 9.5*  HCT 31.6* 29.1*  MCV 87.5 89.5  PLT 219 200      Lab Results  Component Value Date   HEPBSAG NON REACTIVE 07/22/2020   HEPBSAB NON REACTIVE 07/22/2020      Microbiology:  No results found for this or any previous visit (from the past 240 hour(s)).  Coagulation Studies: No results for input(s): LABPROT, INR in the last 72 hours.  Urinalysis: No results for input(s): COLORURINE, LABSPEC, PHURINE, GLUCOSEU, HGBUR, BILIRUBINUR, KETONESUR, PROTEINUR, UROBILINOGEN, NITRITE, LEUKOCYTESUR in the last 72 hours.  Invalid input(s): APPERANCEUR    Imaging: No results found.   Medications:   . sodium chloride     . amLODipine  2.5 mg Oral Daily  . aspirin EC  81 mg Oral Daily  . atorvastatin  80 mg Oral Daily  . bisoprolol  10 mg Oral Daily  . citalopram  20 mg Oral Daily  . clonazepam  0.25 mg Oral BID  . feeding supplement (GLUCERNA SHAKE)  237 mL Oral BID BM  . heparin  5,000 Units Subcutaneous Q8H  . hydrALAZINE  100 mg Oral BID  . insulin aspart  0-5 Units Subcutaneous QHS  . insulin aspart  0-9 Units Subcutaneous TID WC  . insulin detemir  25 Units Subcutaneous Daily  . isosorbide mononitrate  60 mg Oral Daily  . multivitamin  with minerals  1 tablet Oral Daily  . pantoprazole  40 mg Oral Daily  . sodium chloride flush  3 mL Intravenous Q12H   sodium chloride, acetaminophen, albuterol, alum & mag hydroxide-simeth, calcium carbonate, dextromethorphan-guaiFENesin, morphine injection, nitroGLYCERIN, ondansetron (ZOFRAN) IV, ondansetron, sodium chloride flush, zolpidem  Assessment/ Plan:  60 y.o. male with diabetes, hypertension, CHF  admitted on 07/15/2020 for SOB (shortness of breath) [R06.02] Elevated troponin [R77.8] Acute CHF (congestive heart failure) (HCC) [I50.9] Acute on chronic systolic congestive heart failure (HCC) [I50.23] Acute on chronic systolic CHF (congestive heart failure) ([place]) [I50.23]  07/16/2020: 2D echo: LVEF 30 to 35%, global  hypokinesis, moderately dilated left ventricular internal cavity, grade 2 diastolic dysfunction  #Acute kidney injury Likely secondary to ATN/Cardiorenal syndrome, IV contrast exposure 02/05 0701 - 02/06 0700 In: 360 [P.O.:360] Out: 1100 [Urine:1100] Lab Results  Component Value Date   CREATININE 3.73 (H) 07/25/2020   CREATININE 4.00 (H) 07/24/2020   CREATININE 4.23 (H) 07/23/2020   Renal function improving progressively Urine output for the preceding 24 hours is 1100 mL We will continue monitoring closely No acute indication for dialysis  #Chronic kidney disease stage IV Baseline creatinine 2.96/GFR 24 from July 16, 2020 CKD likely secondary to diabetes, hypertension and atherosclerosis Discussed need for  close follow-up with nephrology team post discharge  #Non-STEMI No acute distress or chest pain Management per cardiology team  #Type 2 diabetes with CKD Lab Results  Component Value Date   HGBA1C 9.7 (H) 07/15/2020   Blood glucose 113 this morning Continue insulin regimen as per primary team   LOS: [place] 2/6/20222:29 PM  [place], [place]  Note: This note was prepared with Dragon dictation. Any transcription errors are unintentional

## 2020-07-25 NOTE — Progress Notes (Signed)
Patient continues to rest in bed throughout the day without any requests. I have checked on patient every hour and he has had no request at every interaction.

## 2020-08-01 NOTE — Progress Notes (Signed)
Patient ID: Jimmy Olson, male    DOB: 07-14-1960, 60 y.o.   MRN: [id number]  HPI  [facility] interpreter present during entire visit.   Mr Christe is a 60 y/o male with a history of CAD, DM, HTN, CKD and chronic heart failure.   Echo report from 07/16/20 reviewed and showed an EF of 30-35%.  RHC/LCH done 07/19/20 showed:  Prox RCA lesion is 20% stenosed.  Mid RCA lesion is 30% stenosed.  Dist RCA lesion is 50% stenosed with 50% stenosed side branch in RPAV.  Prox LAD lesion is 40% stenosed.  1st Diag lesion is 70% stenosed.  Mid LAD lesion is 60% stenosed.   1.  Moderately severely calcified coronary arteries with moderate two-vessel coronary artery disease involving the LAD and right coronary artery.  No evidence of critical stenosis. 2.  Right heart catheterization showed mildly elevated filling pressures, mild pulmonary hypertension and normal cardiac output. 3.  Left ventricular angiography was not performed due to chronic kidney disease.  EF was moderately reduced by echo.  Admitted 07/15/20 due to acute on chronic heart failure. Cardiology, nephrology and psychiatry consults obtained. Cath done due to concern about possible NSTEMI which was ruled out. Elevated troponin thought to be due to demand ischemia. Given IV lasix which was then held due to worsening renal function. Had suicidal ideation with anxiety/depressio and psychiatry evaluation done. Lokelma given for hyperkalemia. Discharged after 10 days.  He presents today for his initial visit with a chief complaint of moderate fatigue upon minimal exertion. He describes this as chronic in nature having been present for several months. He has associated shortness of breath, decreased appetite, chest pain, back pain, light-headedness, anxiety and difficulty sleeping along with this. He denies any abdominal distention, palpitations, pedal edema or cough.   Does not have scales so hasn't been weighing himself. He says that he hasn't  taken his medications since 8 am yesterday (it's now 9am today) because he says that they make him tired and upset his stomach if he doesn't eat with them. After further discussion, he says that he took his 2nd hydralazine at bedtime yesterday. Also says that he hasn't been taking his amlodipine at all because he doesn't have a prescription for it.   Asks for a note for his work as he says that his job is very physically demanding requiring a lot of running around and lifting heavy items. He gets fatigued and short of breath while doing the work and brings FMLA forms to be filled out as well. Says that he's also been feeling anxious since his recent admission.   Currently doesn't have any f/u appointments scheduled.   Past Medical History:  Diagnosis Date  . Cardiomyopathy ([place])    a. 07/2009 MV: EF 48%; b. 08/2019 Echo: EF 45-50%. Global HK. Mod LVH. Gr1 DD. Nl RV fxn. Nl PASP.  Marland Kitchen Chest pain    a. 07/2009 MV: EF 48%. No ischemia.  . CHF (congestive heart failure) ([place])   . Chronic kidney disease   . Coronary artery disease   . Diabetes mellitus without complication ([place])   . HFmrEF (heart failure with mildy reduced ejection fraction) ([place])   . Hypertension    Past Surgical History:  Procedure Laterality Date  . APPENDECTOMY    . RIGHT/LEFT HEART CATH AND CORONARY ANGIOGRAPHY N/A 07/19/2020   Procedure: RIGHT/LEFT HEART CATH AND CORONARY ANGIOGRAPHY;  Surgeon: Wellington Hampshire, MD;  Location: [place] [facility];  Service: Cardiovascular;  Laterality:  N/A;   Family History  Problem Relation Age of Onset  . Kidney failure Mother        died @ 74  . Heart failure Mother   . Other Father        he never knew his father   Social History   Tobacco Use  . Smoking status: Never Smoker  . Smokeless tobacco: Never Used  Substance Use Topics  . Alcohol use: Never   No Known Allergies Prior to Admission medications   Medication Sig Start Date End Date Taking? Authorizing Provider   aspirin EC 81 MG EC tablet Take 1 tablet (81 mg total) by mouth daily. Swallow whole. 07/26/20  Yes Jennye Boroughs, MD  bisoprolol (ZEBETA) 10 MG tablet Take 1 tablet (10 mg total) by mouth daily. 07/25/20  Yes Jennye Boroughs, MD  citalopram (CELEXA) 20 MG tablet Take 1 tablet (20 mg total) by mouth daily. 07/26/20  Yes Jennye Boroughs, MD  clonazePAM (KLONOPIN) 0.25 MG disintegrating tablet Take 1 tablet (0.25 mg total) by mouth 2 (two) times daily as needed (anxiety). 07/25/20  Yes Jennye Boroughs, MD  hydrALAZINE (APRESOLINE) 100 MG tablet Take 1 tablet (100 mg total) by mouth 2 (two) times daily. 07/25/20  Yes Jennye Boroughs, MD  insulin detemir (LEVEMIR) 100 UNIT/ML FlexPen Inject 25 Units into the skin daily. 07/25/20  Yes Jennye Boroughs, MD  insulin lispro (HUMALOG) 100 UNIT/ML KwikPen Inject 0.03 mLs (3 Units total) into the skin 3 (three) times daily with meals. Do not take if you do not eat more than 50% of meal Patient taking differently: Inject 5 Units into the skin 3 (three) times daily with meals. Do not take if you do not eat more than 50% of meal 09/15/19  Yes Nicole Kindred A, DO  isosorbide mononitrate (IMDUR) 60 MG 24 hr tablet Take 1 tablet (60 mg total) by mouth daily. 07/25/20  Yes Jennye Boroughs, MD  nitroGLYCERIN (NITROSTAT) 0.4 MG SL tablet Place 1 tablet (0.4 mg total) under the tongue every 5 (five) minutes as needed for chest pain. 07/25/20  Yes Jennye Boroughs, MD  rosuvastatin (CRESTOR) 40 MG tablet Take 1 tablet (40 mg total) by mouth daily. 07/25/20  Yes Jennye Boroughs, MD  Vitamin D, Ergocalciferol, (DRISDOL) 1.25 MG (50000 UNIT) CAPS capsule Take 50,000 Units by mouth every Tuesday.   Yes [provider]  amLODipine (NORVASC) 2.5 MG tablet Take 1 tablet (2.5 mg total) by mouth daily. Patient not taking: Reported on 08/02/2020 08/02/20 09/01/20  Alisa Graff, FNP    Review of Systems  Constitutional: Positive for appetite change (decreased) and fatigue (easily).  HENT:  Positive for postnasal drip. Negative for congestion, rhinorrhea and sore throat.   Eyes: Negative.   Respiratory: Positive for shortness of breath (with moderate exertion). Negative for cough.   Cardiovascular: Positive for chest pain (mild/ intermittent). Negative for palpitations and leg swelling.  Gastrointestinal: Negative for abdominal distention and abdominal pain.  Endocrine: Negative.   Genitourinary: Negative.   Musculoskeletal: Positive for back pain (upper back). Negative for neck pain.  Skin: Negative.   Allergic/Immunologic: Negative.   Neurological: Positive for light-headedness. Negative for dizziness.  Hematological: Negative for adenopathy. Does not bruise/bleed easily.  Psychiatric/Behavioral: Positive for sleep disturbance (due to back pain). Negative for dysphoric mood. The patient is nervous/anxious (at times).    Vitals:   08/02/20 0845  BP: (!) 195/105  Pulse: 85  Resp: 16  SpO2: 99%  Weight: 153 lb 8 oz (69.6 kg)  Height: '5\' 7"'$  (1.702 m)   Wt Readings from Last 3 Encounters:  08/02/20 153 lb 8 oz (69.6 kg)  07/25/20 156 lb 11.2 oz (71.1 kg)  03/06/20 158 lb 11.7 oz (72 kg)   Lab Results  Component Value Date   CREATININE 3.73 (H) 07/25/2020   CREATININE 4.00 (H) 07/24/2020   CREATININE 4.23 (H) 07/23/2020    Physical Exam Vitals and nursing note reviewed. Exam conducted with a chaperone present (interpreter).  Constitutional:      Appearance: He is well-developed.  HENT:     Head: Normocephalic and atraumatic.  Neck:     Vascular: No JVD.  Cardiovascular:     Rate and Rhythm: Normal rate and regular rhythm.  Pulmonary:     Effort: Pulmonary effort is normal. No respiratory distress.     Breath sounds: No wheezing or rales.  Abdominal:     Palpations: Abdomen is soft.     Tenderness: There is no abdominal tenderness.  Musculoskeletal:     Cervical back: Neck supple.     Right lower leg: No tenderness. No edema.     Left lower leg: No  tenderness. No edema.  Skin:    General: Skin is warm and dry.  Neurological:     General: No focal deficit present.     Mental Status: He is alert and oriented to person, place, and time.  Psychiatric:        Mood and Affect: Mood normal.        Behavior: Behavior normal.    Assessment & Plan:  1: Chronic heart failure with reduced ejection fraction- - NYHA class III - euvolemic today - scales given to patient and he was instructed to weigh every morning after using the bathroom, write the weight down and call for an overnight weight gain of > 2 pounds or a weekly weight gain of > 5 pounds - not adding salt to his food; reviewed the importance of closely following a '2000mg'$  sodium diet - scheduled an appointment with [facility] cardiology Mickle Plumb) for 08/13/20 - work note given until he sees them; will work on [employer] forms for him  - BNP 07/20/20 was 584.1  2: HTN- - BP elevated but it's been >24 hours since he last took all his medications (other than 2nd hydralazine dose at bedtime); emphasized that he needed to take his medications before coming to appointments to efficacy can be assessed - sent in RX for amlodipine 2.'5mg'$  to start taking once daily - we were unable to get through to [facility] so advised patient that he needed to call them and schedule an appointment  3: DM/  CKD- - scheduled an appointment with nephrology Candiss Norse) on 08/16/20 - BMP 07/25/20 reviewed and showed sodium 136, potassium 5.3, creatinine 3.73 and GFR 18 - A1c 07/15/20 was 9.7% - glucose at home this morning was 278  4: Anxiety- - reviewed how anxiety is a normal feeling with a health crisis and explained how anxiety can affect his BP and his heart - encouraged him to follow-up with PCP regarding this   Medication list reviewed.   Return in 3 weeks or sooner for any questions/problems before then.

## 2020-08-02 ENCOUNTER — Ambulatory Visit: Payer: BC Managed Care – PPO | Attending: Family | Admitting: Family

## 2020-08-02 ENCOUNTER — Encounter: Payer: Self-pay | Admitting: Family

## 2020-08-02 ENCOUNTER — Other Ambulatory Visit: Payer: Self-pay

## 2020-08-02 VITALS — BP 195/105 | HR 85 | Resp 16 | Ht 67.0 in | Wt 153.5 lb

## 2020-08-02 DIAGNOSIS — N189 Chronic kidney disease, unspecified: Secondary | ICD-10-CM | POA: Insufficient documentation

## 2020-08-02 DIAGNOSIS — F418 Other specified anxiety disorders: Secondary | ICD-10-CM

## 2020-08-02 DIAGNOSIS — I10 Essential (primary) hypertension: Secondary | ICD-10-CM

## 2020-08-02 DIAGNOSIS — I5020 Unspecified systolic (congestive) heart failure: Secondary | ICD-10-CM

## 2020-08-02 DIAGNOSIS — I2510 Atherosclerotic heart disease of native coronary artery without angina pectoris: Secondary | ICD-10-CM | POA: Diagnosis not present

## 2020-08-02 DIAGNOSIS — F419 Anxiety disorder, unspecified: Secondary | ICD-10-CM | POA: Insufficient documentation

## 2020-08-02 DIAGNOSIS — I429 Cardiomyopathy, unspecified: Secondary | ICD-10-CM | POA: Insufficient documentation

## 2020-08-02 DIAGNOSIS — I509 Heart failure, unspecified: Secondary | ICD-10-CM | POA: Diagnosis not present

## 2020-08-02 DIAGNOSIS — Z8249 Family history of ischemic heart disease and other diseases of the circulatory system: Secondary | ICD-10-CM | POA: Diagnosis not present

## 2020-08-02 DIAGNOSIS — Z79899 Other long term (current) drug therapy: Secondary | ICD-10-CM | POA: Insufficient documentation

## 2020-08-02 DIAGNOSIS — Z794 Long term (current) use of insulin: Secondary | ICD-10-CM | POA: Insufficient documentation

## 2020-08-02 DIAGNOSIS — E1122 Type 2 diabetes mellitus with diabetic chronic kidney disease: Secondary | ICD-10-CM | POA: Diagnosis not present

## 2020-08-02 DIAGNOSIS — N184 Chronic kidney disease, stage 4 (severe): Secondary | ICD-10-CM

## 2020-08-02 DIAGNOSIS — Z7982 Long term (current) use of aspirin: Secondary | ICD-10-CM | POA: Insufficient documentation

## 2020-08-02 DIAGNOSIS — I130 Hypertensive heart and chronic kidney disease with heart failure and stage 1 through stage 4 chronic kidney disease, or unspecified chronic kidney disease: Secondary | ICD-10-CM | POA: Diagnosis not present

## 2020-08-02 MED ORDER — AMLODIPINE BESYLATE 2.5 MG PO TABS: 2.5000 mg | ORAL_TABLET | Freq: Every day | ORAL | 5 refills | Status: DC

## 2020-08-02 NOTE — Patient Instructions (Addendum)
Begin weighing daily and call for an overnight weight gain of > 2 pounds or a weekly weight gain of >5 pounds. Empiece a pesarse en forma diaria y llamenos si sube 2 libras en una noche o si sube de peso 5 libras en una semana  Take your medications every morning before coming to your appointments.  Tome sus medicamentos cada maana antes de venir a sus citas.   Dr [facility] 08/16/20 at 1020am  [address] [place], [place] [address] [phone]

## 2020-08-13 ENCOUNTER — Ambulatory Visit: Payer: BC Managed Care – PPO | Admitting: Physician Assistant

## 2020-08-13 ENCOUNTER — Other Ambulatory Visit: Payer: Self-pay

## 2020-08-13 ENCOUNTER — Ambulatory Visit (INDEPENDENT_AMBULATORY_CARE_PROVIDER_SITE_OTHER): Payer: BC Managed Care – PPO | Admitting: Physician Assistant

## 2020-08-13 ENCOUNTER — Encounter: Payer: Self-pay | Admitting: Physician Assistant

## 2020-08-13 VITALS — BP 153/84 | HR 66 | Wt 154.0 lb

## 2020-08-13 DIAGNOSIS — I2510 Atherosclerotic heart disease of native coronary artery without angina pectoris: Secondary | ICD-10-CM

## 2020-08-13 DIAGNOSIS — N184 Chronic kidney disease, stage 4 (severe): Secondary | ICD-10-CM

## 2020-08-13 DIAGNOSIS — I5042 Chronic combined systolic (congestive) and diastolic (congestive) heart failure: Secondary | ICD-10-CM

## 2020-08-13 DIAGNOSIS — E785 Hyperlipidemia, unspecified: Secondary | ICD-10-CM

## 2020-08-13 DIAGNOSIS — I10 Essential (primary) hypertension: Secondary | ICD-10-CM | POA: Diagnosis not present

## 2020-08-13 DIAGNOSIS — Z789 Other specified health status: Secondary | ICD-10-CM

## 2020-08-13 DIAGNOSIS — D638 Anemia in other chronic diseases classified elsewhere: Secondary | ICD-10-CM

## 2020-08-13 NOTE — Patient Instructions (Addendum)
Medication Instructions:   Please pick up Amolidipine 2.5 mg once daily from your pharmacy  *If you need a refill on your cardiac medications before your next appointment, please call your pharmacy*   Lab Work: None  If you have labs (blood work) drawn today and your tests are completely normal, you will receive your results only by: Marland Kitchen MyChart Message (if you have MyChart) OR . A paper copy in the mail If you have any lab test that is abnormal or we need to change your treatment, we will call you to review the results.   Testing/Procedures: None   Follow-Up: At [facility], you and your health needs are our priority.  As part of our continuing mission to provide you with exceptional heart care, we have created designated Provider Care Teams.  These Care Teams include your primary Cardiologist (physician) and Advanced Practice Providers (APPs -  Physician Assistants and Nurse Practitioners) who all work together to provide you with the care you need, when you need it.  We recommend signing up for the patient portal called "MyChart".  Sign up information is provided on this After Visit Summary.  MyChart is used to connect with patients for Virtual Visits (Telemedicine).  Patients are able to view lab/test results, encounter notes, upcoming appointments, etc.  Non-urgent messages can be sent to your provider as well.   To learn more about what you can do with MyChart, go to [email or web address].    Your next appointment:   1 month(s)  The format for your next appointment:   In Person  Provider:   Ida Rogue, MD or Christell Faith, PA-C

## 2020-08-13 NOTE — Progress Notes (Signed)
Cardiology Office Note    Date:  08/13/2020   ID:  Feras, Mustapha [birth year], MRN [id number]  PCP:  [facility]  Cardiologist:  Ida Rogue, MD  Electrophysiologist:  None   Chief Complaint: Hospital follow-up  History of Present Illness:   Jimmy Olson is a 60 y.o. male with history of nonobstructive CAD by [place] in 06/2020, chronic combined systolic and diastolic CHF, NICM, CKD stage IV, Covid infection and 02/2020 status post monoclonal antibody infusion, DM2, HTN, HLD, anemia, and anxiety who presents for hospital follow-up after recent admission to [facility] from 1/27 to 2/6 for acute on chronic combined systolic and diastolic CHF complicated by acute on CKD stage IV, and demand ischemia.  Prior stress test in 2011 for chest pain was negative for significant ischemia.  He was readmitted to the hospital in 08/2019 with exertional chest pain with high-sensitivity troponin peaking at 199.  Echo showed an EF of 45 to 50%, global hypokinesis, moderate LVH, and grade 1 diastolic dysfunction.  His cardiomyopathy was felt to be in the setting of longstanding hypertension with recommendation to follow-up as an outpatient for Myoview.  Following this admission he was lost to follow-up.  More recently he was admitted in 06/2020 with acute on chronic combined systolic and diastolic CHF complicated by acute on CKD stage IV.  Echo on 07/16/2020 showed an EF of 30 to 35%, global hypokinesis, mildly dilated LV cavity size, mild LVH, grade 2 diastolic dysfunction, normal RV systolic function and ventricular cavity size, mildly dilated left atrium, and mild mitral valve regurgitation.  R/LHC on 07/19/2020 showed moderately calcified coronary arteries with moderate two-vessel CAD involving the LAD and RCA as outlined below with no evidence of critical stenosis.  RHC showed mildly elevated filling pressures, mild pulmonary hypertension, and normal cardiac output.  His cardiomyopathy was out of  proportion to his underlying CAD with medical management recommended.  During his admission he had developed acute on CKD stage IV with consideration for hemodialysis during admission though this was deferred given slow improvement in his creatinine.  During his admission he was evaluated by psychiatry with anxiety, depression, and suicidal ideation.  Discharge cardiac medications included amlodipine 2.5 mg, aspirin 81 mg, bisoprolol 10 mg, hydralazine 100 mg twice daily, Imdur 60 mg, and Crestor 40 mg.  Documented discharge weight of 71.1 kg.  Since his hospital discharge he has been seen by the [facility] CHF clinic and was noted to be hypertensive with BP 0000000 systolic.  Amlodipine was started (unclear if he started this following his discharge).  He comes in today accompanied by his son.  His entire visit is performed with the assistance of a medical interpreter.  Since his hospital discharge he indicates he has done well and denies any chest pain, dyspnea, palpitations, dizziness, presyncope, or syncope.  He is eager to return back to work.  He has not started amlodipine as directed at his hospital discharge or by his visit with the [facility] CHF clinic.  He indicates his BP at home typically runs in the 0000000 to Q000111Q systolic.  He denies eating a diet high in sodium though does eat out at a [employer] 2 days/week.  He drinks less than 2 L of fluid per day.  No issues from his cardiac cath site.  He has follow-up with nephrology on 2/28.  He does not have any issues or concerns at this time.   Labs independently reviewed: 07/2020 - potassium 5.3, BUN  60, serum creatinine 3.73, Hgb 9.5, PLT 200 06/2020 - magnesium 1.9, TC 186, TG 178, HDL 40, LDL 110, A1c 9.7, albumin 3.0, AST/ALT normal  Past Medical History:  Diagnosis Date  . Cardiomyopathy ([place])    a. 07/2009 MV: EF 48%; b. 08/2019 Echo: EF 45-50%. Global HK. Mod LVH. Gr1 DD. Nl RV fxn. Nl PASP.  Marland Kitchen Chest pain    a. 07/2009 MV: EF 48%. No  ischemia.  . Chronic kidney disease (CKD), stage IV (severe) ([place])   . Coronary artery disease   . Diabetes mellitus without complication ([place])   . HFmrEF (heart failure with mildy reduced ejection fraction) ([place])   . Hypertension     Past Surgical History:  Procedure Laterality Date  . APPENDECTOMY    . CARDIAC CATHETERIZATION    . NASAL SINUS SURGERY    . RIGHT/LEFT HEART CATH AND CORONARY ANGIOGRAPHY N/A 07/19/2020   Procedure: RIGHT/LEFT HEART CATH AND CORONARY ANGIOGRAPHY;  Surgeon: Wellington Hampshire, MD;  Location: [facility];  Service: Cardiovascular;  Laterality: N/A;    Current Medications: Current Meds  Medication Sig  . aspirin EC 81 MG EC tablet Take 1 tablet (81 mg total) by mouth daily. Swallow whole.  . bisoprolol (ZEBETA) 10 MG tablet Take 1 tablet (10 mg total) by mouth daily.  . citalopram (CELEXA) 20 MG tablet Take 1 tablet (20 mg total) by mouth daily.  . clonazePAM (KLONOPIN) 0.25 MG disintegrating tablet Take 1 tablet (0.25 mg total) by mouth 2 (two) times daily as needed (anxiety).  . hydrALAZINE (APRESOLINE) 100 MG tablet Take 1 tablet (100 mg total) by mouth 2 (two) times daily.  . insulin detemir (LEVEMIR) 100 unit/ml SOLN Inject 35 Units into the skin daily.  . insulin lispro (HUMALOG) 100 UNIT/ML injection Inject 5 Units into the skin 3 (three) times daily before meals.  . isosorbide mononitrate (IMDUR) 60 MG 24 hr tablet Take 1 tablet (60 mg total) by mouth daily.  . nitroGLYCERIN (NITROSTAT) 0.4 MG SL tablet Place 1 tablet (0.4 mg total) under the tongue every 5 (five) minutes as needed for chest pain.  . rosuvastatin (CRESTOR) 40 MG tablet Take 1 tablet (40 mg total) by mouth daily.    Allergies:   Patient has no known allergies.   Social History   Socioeconomic History  . Marital status: Married    Spouse name: Not on file  . Number of children: Not on file  . Years of education: Not on file  . Highest education level: Not on file   Occupational History  . Not on file  Tobacco Use  . Smoking status: Never Smoker  . Smokeless tobacco: Never Used  Vaping Use  . Vaping Use: Never used  Substance and Sexual Activity  . Alcohol use: Never  . Drug use: Never  . Sexual activity: Not on file  Other Topics Concern  . Not on file  Social History Narrative   Lives locally with wife and son.  He is currently unemployed - has worked in different industries.     Social Determinants of Health   Financial Resource Strain: Not on file  Food Insecurity: Not on file  Transportation Needs: Not on file  Physical Activity: Not on file  Stress: Not on file  Social Connections: Not on file     Family History:  The patient's family history includes Diabetes in his brother; Heart failure in his mother; Kidney failure in his mother; Other in his father.  ROS:   Review of Systems  Constitutional: Positive for malaise/fatigue. Negative for chills, diaphoresis, fever and weight loss.  HENT: Negative for congestion.   Eyes: Negative for discharge and redness.  Respiratory: Negative for cough, sputum production, shortness of breath and wheezing.   Cardiovascular: Negative for chest pain, palpitations, orthopnea, claudication, leg swelling and PND.  Gastrointestinal: Negative for abdominal pain, heartburn, nausea and vomiting.  Musculoskeletal: Negative for falls and myalgias.  Skin: Negative for rash.  Neurological: Negative for dizziness, tingling, tremors, sensory change, speech change, focal weakness, loss of consciousness and weakness.  Endo/Heme/Allergies: Does not bruise/bleed easily.  Psychiatric/Behavioral: Negative for substance abuse. The patient is not nervous/anxious.   All other systems reviewed and are negative.    EKGs/Labs/Other Studies Reviewed:    Studies reviewed were summarized above. The additional studies were reviewed today:  R/LHC 06/2020:  Prox RCA lesion is 20% stenosed.  Mid RCA lesion is 30%  stenosed.  Dist RCA lesion is 50% stenosed with 50% stenosed side branch in RPAV.  Prox LAD lesion is 40% stenosed.  1st Diag lesion is 70% stenosed.  Mid LAD lesion is 60% stenosed.   1.  Moderately severely calcified coronary arteries with moderate two-vessel coronary artery disease involving the LAD and right coronary artery.  No evidence of critical stenosis. 2.  Right heart catheterization showed mildly elevated filling pressures, mild pulmonary hypertension and normal cardiac output. 3.  Left ventricular angiography was not performed due to chronic kidney disease.  EF was moderately reduced by echo.  Recommendations: Recommend aggressive medical therapy for nonobstructive coronary artery disease.  Cardiomyopathy seems to be out of proportion to coronary artery disease. Monitor for contrast-induced nephropathy.  Only 40 mL of contrast was used.  We will do gentle hydration for 6 hours.  I suspect that he will require an oral diuretic by tomorrow. __________  2D echo 06/2020: 1. Left ventricular ejection fraction, by estimation, is 30 to 35%. The  left ventricle has moderately decreased function. The left ventricle  demonstrates global hypokinesis. The left ventricular internal cavity size  was moderately dilated. There is mild  left ventricular hypertrophy. Left ventricular diastolic parameters are  consistent with Grade II diastolic dysfunction (pseudonormalization). The  average left ventricular global longitudinal strain is -10.1 %. The global  longitudinal strain is abnormal.  2. Right ventricular systolic function is normal. The right ventricular  size is normal. Tricuspid regurgitation signal is inadequate for assessing  PA pressure.  3. Left atrial size was mildly dilated.  4. The inferior vena cava is normal in size with greater than 50%  respiratory variability, suggesting right atrial pressure of 3 mmHg. __________  2D echo 08/2019: 1. Left ventricular ejection  fraction, by estimation, is 45 to 50%. The  left ventricle has mildly decreased function. The left ventricle  demonstrates global hypokinesis. There is moderate left ventricular  hypertrophy. Left ventricular diastolic  parameters are consistent with Grade I diastolic dysfunction (impaired  relaxation).  2. Right ventricular systolic function is normal. The right ventricular  size is normal. There is normal pulmonary artery systolic pressure.  __________  Treadmill Myoview 07/2009: 1.     Normal treadmill EKG without evidence of ischemia or arrhythmia.  2.     Good exercise tolerance for age.  3.     Normal LV systolic function. Ejection fraction 48%.  4.     Normal myocardial perfusion without evidence of myocardial  ischemia.    EKG:  EKG is ordered today.  The  EKG ordered today demonstrates NSR, 66 bpm, nonspecific lateral ST-T changes which are somewhat improved when compared to prior tracing  Recent Labs: 07/15/2020: ALT 15 07/16/2020: Magnesium 1.9 07/20/2020: B Natriuretic Peptide 584.1; Hemoglobin 9.5; Platelets 200 07/25/2020: BUN 60; Creatinine, Ser 3.73; Potassium 5.3; Sodium 136  Recent Lipid Panel    Component Value Date/Time   CHOL 186 07/16/2020 0532   TRIG 178 (H) 07/16/2020 0532   HDL 40 (L) 07/16/2020 0532   CHOLHDL 4.7 07/16/2020 0532   VLDL 36 07/16/2020 0532   LDLCALC 110 (H) 07/16/2020 0532    PHYSICAL EXAM:    VS:  BP (!) 153/84   Pulse 66   Wt 154 lb (69.9 kg)   SpO2 98%   BMI 24.12 kg/m   BMI: Body mass index is 24.12 kg/m.  Physical Exam Vitals reviewed.  Constitutional:      Appearance: He is well-developed and well-nourished.  HENT:     Head: Normocephalic and atraumatic.  Eyes:     General:        Right eye: No discharge.        Left eye: No discharge.  Neck:     Vascular: No JVD.  Cardiovascular:     Rate and Rhythm: Normal rate and regular rhythm.     Pulses: No midsystolic click and no opening snap.          Posterior tibial  pulses are 2+ on the right side and 2+ on the left side.     Heart sounds: Normal heart sounds, S1 normal and S2 normal. Heart sounds not distant. No murmur heard. No friction rub.  Pulmonary:     Effort: Pulmonary effort is normal. No respiratory distress.     Breath sounds: Normal breath sounds. No decreased breath sounds, wheezing or rales.  Chest:     Chest wall: No tenderness.  Abdominal:     General: There is no distension.     Palpations: Abdomen is soft.     Tenderness: There is no abdominal tenderness.  Musculoskeletal:        General: No edema.     Cervical back: Normal range of motion.  Skin:    General: Skin is warm and dry.     Nails: There is no clubbing or cyanosis.  Neurological:     Mental Status: He is alert and oriented to person, place, and time.  Psychiatric:        Mood and Affect: Mood and affect normal.        Speech: Speech normal.        Behavior: Behavior normal.        Thought Content: Thought content normal.        Judgment: Judgment normal.     Wt Readings from Last 3 Encounters:  08/13/20 154 lb (69.9 kg)  08/02/20 153 lb 8 oz (69.6 kg)  07/25/20 156 lb 11.2 oz (71.1 kg)     ASSESSMENT & PLAN:   1. Chronic combined systolic and diastolic CHF/NICM: He appears euvolemic and well compensated with NYHA class II symptoms.  Continue current GDMT including bisoprolol, and Imdur/hydralazine.  Not requiring a standing diuretic.  Not on ACE inhibitor/ARB/ARN I/MRA secondary to CKD stage IV.  CHF education.  Recommend follow-up echo in several months time on maximally tolerated optimal GDMT to reevaluate his LV systolic function.  If his EF is less than 35% at that time consider referral to EP for consideration of ICD.  2. Nonobstructive CAD: No symptoms  concerning for angina.  Recent LHC with nonobstructive disease as outlined above.  Continue current medical therapy.  No indication for further ischemic testing at this time.  3. CKD stage IV with  hyperkalemia: Patient was given Lokelma prior to discharge earlier this month with a noted potassium at the time of 5.3.  He has follow-up with nephrology in 3 days.  4. HTN: Blood pressure 180/100 at triage with patient reported blood pressure readings in the 0000000 to Q000111Q systolic at home.  Repeat BP at the end of his visit 153/84.  Recommend he pick up the previously prescribed amlodipine, otherwise continue bisoprolol, hydralazine, and isosorbide mononitrate.  Low-sodium diet recommended.  5. HLD: LDL 110 from 06/2020 with normal LFT at that time.  Goal LDL less than 70.  He remains on rosuvastatin 40 mg daily.  Recommend follow-up fasting lipid panel and LFT in approximately 8 weeks time.  If LDL remains above goal at that time consider addition of ezetimibe.  6. Anemia of chronic disease: No symptoms concerning for bleeding.  Follow-up with PCP as directed.  7. Language barrier: Hospital medical interpreter utilized for today's visit.   Disposition: F/u with Dr. Rockey Situ or an APP in 1 month.   Medication Adjustments/Labs and Tests Ordered: Current medicines are reviewed at length with the patient today.  Concerns regarding medicines are outlined above. Medication changes, Labs and Tests ordered today are summarized above and listed in the Patient Instructions accessible in Encounters.   Signed, Christell Faith, PA-C 08/13/2020 4:29 PM     [facility] [address] [place], [place] [address] [phone]

## 2020-08-16 ENCOUNTER — Telehealth: Payer: Self-pay | Admitting: *Deleted

## 2020-08-16 DIAGNOSIS — I5022 Chronic systolic (congestive) heart failure: Secondary | ICD-10-CM | POA: Diagnosis not present

## 2020-08-16 DIAGNOSIS — E1122 Type 2 diabetes mellitus with diabetic chronic kidney disease: Secondary | ICD-10-CM | POA: Diagnosis not present

## 2020-08-16 DIAGNOSIS — I10 Essential (primary) hypertension: Secondary | ICD-10-CM | POA: Diagnosis not present

## 2020-08-16 DIAGNOSIS — N184 Chronic kidney disease, stage 4 (severe): Secondary | ICD-10-CM | POA: Diagnosis not present

## 2020-08-16 NOTE — Telephone Encounter (Signed)
Reviewed the patient's chart. Amlodipine 2.5 mg once daily is not currently listed on his medication list. I am unsure which pharmacy of the 2 listed on file this might have gone to.  I attempted to call [facility], but the only option was to leave a voice mail message. I left a message on the pharmacy line that we are trying to confirm if amlodipine 2.5 mg once daily had been sent in by a provider for this patient and if so, did he pick this up. I asked that the pharmacy call the office back to confirm.

## 2020-08-16 NOTE — Telephone Encounter (Signed)
-----   Message from Valora Corporal, RN sent at 08/13/2020  3:06 PM EST ----- Thurmond Butts wanted me to call this patients pharmacy to see if he picked up amlodipine 2.5 mg once daily. They are closed until Monday at 08:30 AM. He just wanted to see if the scripts were sent in and if patient had picked up. Just let Thurmond Butts know and patient was instructed to pick up on Monday morning. Thanks

## 2020-08-17 NOTE — Telephone Encounter (Signed)
Left voicemail message on spouse number listed using interpreter Cristan [phone]

## 2020-08-17 NOTE — Telephone Encounter (Signed)
Spoke with Leroy Sea at [facility]. He reports there was 2 prescriptions available for patient to pick up. He states patient has still not picked up either one. Brad read both scripts with number of refills. Had him cancel one and to keep the other with adequate refills. Will make provider aware and will reach out to encourage patient to please pick up and start when possible.

## 2020-08-17 NOTE — Telephone Encounter (Signed)
No answer/No voicemail box has been set up. 

## 2020-08-17 NOTE — Telephone Encounter (Signed)
Left voicemail message to call back to review patient medication.

## 2020-08-17 NOTE — Telephone Encounter (Signed)
Patient number called with Interpreter Cristan # [phone] and no answer/voicemail box has not been set up.

## 2020-08-18 ENCOUNTER — Encounter: Payer: Self-pay | Admitting: *Deleted

## 2020-08-18 NOTE — Telephone Encounter (Signed)
Called patient to request he pick up prescription at [facility] with interpreter [place] [phone]. There was no answer and no voicemail box has been set up for the number listed as patient number. Will request interpreter services to please write letter in Spanish to mail to patient.

## 2020-08-18 NOTE — Telephone Encounter (Signed)
Letter mailed to patient requesting he pick up prescription from [facility] and to call if any further questions.

## 2020-08-25 NOTE — Progress Notes (Deleted)
Patient ID: Jimmy Olson, male    DOB: September 24, 1960, 60 y.o.   MRN: [id number]  HPI  [facility] interpreter present during entire visit.   Jimmy Olson is a 60 y/o male with a history of CAD, DM, HTN, CKD and chronic heart failure.   Echo report from 07/16/20 reviewed and showed an EF of 30-35%.  RHC/LCH done 07/19/20 showed:  Prox RCA lesion is 20% stenosed.  Mid RCA lesion is 30% stenosed.  Dist RCA lesion is 50% stenosed with 50% stenosed side branch in RPAV.  Prox LAD lesion is 40% stenosed.  1st Diag lesion is 70% stenosed.  Mid LAD lesion is 60% stenosed.   1.  Moderately severely calcified coronary arteries with moderate two-vessel coronary artery disease involving the LAD and right coronary artery.  No evidence of critical stenosis. 2.  Right heart catheterization showed mildly elevated filling pressures, mild pulmonary hypertension and normal cardiac output. 3.  Left ventricular angiography was not performed due to chronic kidney disease.  EF was moderately reduced by echo.  Admitted 07/15/20 due to acute on chronic heart failure. Cardiology, nephrology and psychiatry consults obtained. Cath done due to concern about possible NSTEMI which was ruled out. Elevated troponin thought to be due to demand ischemia. Given IV lasix which was then held due to worsening renal function. Had suicidal ideation with anxiety/depressio and psychiatry evaluation done. Lokelma given for hyperkalemia. Discharged after 10 days.  He presents today for a follow-up visit with a chief complaint of      Past Medical History:  Diagnosis Date  . Cardiomyopathy ([place])    a. 07/2009 MV: EF 48%; b. 08/2019 Echo: EF 45-50%. Global HK. Mod LVH. Gr1 DD. Nl RV fxn. Nl PASP.  Marland Kitchen Chest pain    a. 07/2009 MV: EF 48%. No ischemia.  . Chronic kidney disease (CKD), stage IV (severe) ([place])   . Coronary artery disease   . Diabetes mellitus without complication ([place])   . HFmrEF (heart failure with mildy reduced ejection  fraction) ([place])   . Hypertension    Past Surgical History:  Procedure Laterality Date  . APPENDECTOMY    . CARDIAC CATHETERIZATION    . NASAL SINUS SURGERY    . RIGHT/LEFT HEART CATH AND CORONARY ANGIOGRAPHY N/A 07/19/2020   Procedure: RIGHT/LEFT HEART CATH AND CORONARY ANGIOGRAPHY;  Surgeon: Wellington Hampshire, MD;  Location: [facility];  Service: Cardiovascular;  Laterality: N/A;   Family History  Problem Relation Age of Onset  . Kidney failure Mother        died @ 51  . Heart failure Mother   . Other Father        he never knew his father  . Diabetes Brother    Social History   Tobacco Use  . Smoking status: Never Smoker  . Smokeless tobacco: Never Used  Substance Use Topics  . Alcohol use: Never   No Known Allergies   Review of Systems  Constitutional: Positive for appetite change (decreased) and fatigue (easily).  HENT: Positive for postnasal drip. Negative for congestion, rhinorrhea and sore throat.   Eyes: Negative.   Respiratory: Positive for shortness of breath (with moderate exertion). Negative for cough.   Cardiovascular: Positive for chest pain (mild/ intermittent). Negative for palpitations and leg swelling.  Gastrointestinal: Negative for abdominal distention and abdominal pain.  Endocrine: Negative.   Genitourinary: Negative.   Musculoskeletal: Positive for back pain (upper back). Negative for neck pain.  Skin: Negative.   Allergic/Immunologic:  Negative.   Neurological: Positive for light-headedness. Negative for dizziness.  Hematological: Negative for adenopathy. Does not bruise/bleed easily.  Psychiatric/Behavioral: Positive for sleep disturbance (due to back pain). Negative for dysphoric mood. The patient is nervous/anxious (at times).      Physical Exam Vitals and nursing note reviewed. Exam conducted with a chaperone present (interpreter).  Constitutional:      Appearance: He is well-developed.  HENT:     Head: Normocephalic and  atraumatic.  Neck:     Vascular: No JVD.  Cardiovascular:     Rate and Rhythm: Normal rate and regular rhythm.  Pulmonary:     Effort: Pulmonary effort is normal. No respiratory distress.     Breath sounds: No wheezing or rales.  Abdominal:     Palpations: Abdomen is soft.     Tenderness: There is no abdominal tenderness.  Musculoskeletal:     Cervical back: Neck supple.     Right lower leg: No tenderness. No edema.     Left lower leg: No tenderness. No edema.  Skin:    General: Skin is warm and dry.  Neurological:     General: No focal deficit present.     Mental Status: He is alert and oriented to person, place, and time.  Psychiatric:        Mood and Affect: Mood normal.        Behavior: Behavior normal.    Assessment & Plan:  1: Chronic heart failure with reduced ejection fraction- - NYHA class III - euvolemic today - scales given to patient and he was instructed to weigh every morning after using the bathroom, write the weight down and call for an overnight weight gain of > 2 pounds or a weekly weight gain of > 5 pounds - weight 153.8 from last visit here 1 month ago - not adding salt to his food; reviewed the importance of closely following a '2000mg'$  sodium diet - saw cardiology ([facility]) for 08/13/20 - work note given until he sees them; will work on [employer] forms for him  - BNP 07/20/20 was 584.1  2: HTN- - BP  - BMP 08/16/20 reviewed and showed sodium 136, potassium 4.6, creatinine 2.94 and GFR 23 - we were unable to get through to [facility] so advised patient that he needed to call them and schedule an appointment  3: DM/  CKD- - saw nephrology Candiss Norse) on 08/16/20 - A1c 07/15/20 was 9.7% - glucose at home this morning was   4: Anxiety- - reviewed how anxiety is a normal feeling with a health crisis and explained how anxiety can affect his BP and his heart - encouraged him to follow-up with PCP regarding this   Medication list reviewed.

## 2020-08-26 ENCOUNTER — Ambulatory Visit: Payer: BC Managed Care – PPO | Admitting: Family

## 2020-09-06 ENCOUNTER — Other Ambulatory Visit: Payer: Self-pay

## 2020-09-06 ENCOUNTER — Encounter: Payer: Self-pay | Admitting: Family

## 2020-09-06 ENCOUNTER — Ambulatory Visit: Payer: BC Managed Care – PPO | Attending: Family | Admitting: Family

## 2020-09-06 VITALS — BP 192/98 | HR 75 | Resp 18 | Ht 66.0 in | Wt 152.0 lb

## 2020-09-06 DIAGNOSIS — I5042 Chronic combined systolic (congestive) and diastolic (congestive) heart failure: Secondary | ICD-10-CM

## 2020-09-06 DIAGNOSIS — Z8249 Family history of ischemic heart disease and other diseases of the circulatory system: Secondary | ICD-10-CM | POA: Diagnosis not present

## 2020-09-06 DIAGNOSIS — Z7982 Long term (current) use of aspirin: Secondary | ICD-10-CM | POA: Diagnosis not present

## 2020-09-06 DIAGNOSIS — Z79899 Other long term (current) drug therapy: Secondary | ICD-10-CM | POA: Diagnosis not present

## 2020-09-06 DIAGNOSIS — F329 Major depressive disorder, single episode, unspecified: Secondary | ICD-10-CM

## 2020-09-06 DIAGNOSIS — E1122 Type 2 diabetes mellitus with diabetic chronic kidney disease: Secondary | ICD-10-CM | POA: Diagnosis not present

## 2020-09-06 DIAGNOSIS — N184 Chronic kidney disease, stage 4 (severe): Secondary | ICD-10-CM | POA: Insufficient documentation

## 2020-09-06 DIAGNOSIS — Z636 Dependent relative needing care at home: Secondary | ICD-10-CM | POA: Insufficient documentation

## 2020-09-06 DIAGNOSIS — I272 Pulmonary hypertension, unspecified: Secondary | ICD-10-CM | POA: Diagnosis not present

## 2020-09-06 DIAGNOSIS — I5022 Chronic systolic (congestive) heart failure: Secondary | ICD-10-CM | POA: Insufficient documentation

## 2020-09-06 DIAGNOSIS — Z563 Stressful work schedule: Secondary | ICD-10-CM | POA: Insufficient documentation

## 2020-09-06 DIAGNOSIS — Z6379 Other stressful life events affecting family and household: Secondary | ICD-10-CM | POA: Diagnosis not present

## 2020-09-06 DIAGNOSIS — Z794 Long term (current) use of insulin: Secondary | ICD-10-CM | POA: Insufficient documentation

## 2020-09-06 DIAGNOSIS — F32A Depression, unspecified: Secondary | ICD-10-CM | POA: Insufficient documentation

## 2020-09-06 DIAGNOSIS — I2510 Atherosclerotic heart disease of native coronary artery without angina pectoris: Secondary | ICD-10-CM | POA: Insufficient documentation

## 2020-09-06 DIAGNOSIS — I130 Hypertensive heart and chronic kidney disease with heart failure and stage 1 through stage 4 chronic kidney disease, or unspecified chronic kidney disease: Secondary | ICD-10-CM | POA: Insufficient documentation

## 2020-09-06 DIAGNOSIS — I10 Essential (primary) hypertension: Secondary | ICD-10-CM

## 2020-09-06 MED ORDER — BISOPROLOL FUMARATE 10 MG PO TABS: 10.0000 mg | ORAL_TABLET | Freq: Every day | ORAL | 6 refills | Status: DC

## 2020-09-06 MED ORDER — HYDRALAZINE HCL 100 MG PO TABS
100.0000 mg | ORAL_TABLET | Freq: Two times a day (BID) | ORAL | 6 refills | Status: DC
Start: 2020-09-06 — End: 2020-12-24

## 2020-09-06 NOTE — Patient Instructions (Signed)
Continue weighing daily and call for an overnight weight gain of > 2 pounds or a weekly weight gain of >5 pounds. 
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
## 2020-09-08 ENCOUNTER — Encounter: Payer: Self-pay | Admitting: Family

## 2020-09-12 NOTE — Progress Notes (Signed)
Cardiology Office Note    Date:  09/13/2020   ID:  Braxton, Colandrea January 08, 1961, MRN [id number]  PCP:  [facility]  Cardiologist:  Ida Rogue, MD  Electrophysiologist:  None   Chief Complaint: Follow up  History of Present Illness:   ASBERY KROESE is a 60 y.o. male with history of nonobstructive CAD by [place] in 06/2020, chronic combined systolic and diastolic CHF, NICM, CKD stage IV, Covid infection in 02/2020 status post monoclonal antibody infusion, DM2, HTN, HLD, anemia, and anxiety who presents for follow up of his cardiomyopathy.  Prior stress test in 2011 for chest pain was negative for significant ischemia.  He was admitted to the hospital in 08/2019 with exertional chest pain with high-sensitivity troponin peaking at 199.  Echo showed an EF of 45 to 50%, global hypokinesis, moderate LVH, and grade 1 diastolic dysfunction.  His cardiomyopathy was felt to be in the setting of longstanding hypertension with recommendation to follow-up as an outpatient for Myoview.  Following this admission he was lost to follow-up.  More recently he was admitted in 06/2020 with acute on chronic combined systolic and diastolic CHF complicated by acute on CKD stage IV.  Echo on 07/16/2020 showed an EF of 30 to 35%, global hypokinesis, mildly dilated LV cavity size, mild LVH, grade 2 diastolic dysfunction, normal RV systolic function and ventricular cavity size, mildly dilated left atrium, and mild mitral valve regurgitation.  R/LHC on 07/19/2020 showed moderately calcified coronary arteries with moderate two-vessel CAD involving the LAD and RCA as outlined below with no evidence of critical stenosis.  RHC showed mildly elevated filling pressures, mild pulmonary hypertension, and normal cardiac output.  His cardiomyopathy was out of proportion to his underlying CAD with medical management recommended.  During his admission he developed acute on CKD stage IV with consideration for  hemodialysis during admission though this was deferred given slow improvement in his creatinine.  During his admission he was evaluated by psychiatry with anxiety, depression, and suicidal ideation.    He was last seen in this office in late 07/2020 and reported he had done well following discharge.  He was eager to return back to work.  He indicated his BP was typically running in the 0000000 to [id number] systolic.  He did continue to consume a diet high in sodium.  BP was mildly elevated at 153/84.  He was not taking previously prescribed amlodipine and was advised to start this along with continuation of his remaining medications including bisoprolol, hydralazine, and Imdur.  He was recently seen by the [facility] CHF clinic on 09/06/2020 with a BP of 192/98.  He reported having not taking any of his medications yet that day and reported being out of hydralazine and bisoprolol for approximately 1 week.  His weight was stable.  Medications were refilled.  He comes in doing well from a cardiac perspective.  He indicates he is "70%" better which is 20% better than his last visit.  He is adherent to all medications and is tolerating them without issues.  He indicates his BP is typically 0000000 systolic at home.  He is trying to watch his salt and p.o. fluid intake though does continue to eat at a [place] buffet once per week.  He indicates he eats the vegetables and nonfried foods and tries to limit his sauce/salt consumption.  His weight remains stable.  He has stable two-pillow orthopnea.  No chest pain, dizziness, presyncope, or syncope.  No lower extremity  swelling, abdominal distention, orthopnea, or PND.  He is having to look for a new job as his current employer is unable to accommodate restrictions recommended by the [facility].  He indicates if the rash to work greater than 8 hours he becomes quite fatigued short of breath, and at times has noted palpitations.  Outside of overexertion he denies any  shortness of breath or palpitations.  Otherwise, he does not have any issues or concerns at this time.   Labs independently reviewed: 07/2020 - potassium 4.6, BUN 46, serum creatinine 2.94, albumin 3.3, Hgb 10.3, PLT 206 06/2020 - magnesium 1.9, TC 186, TG 178, HDL 40, LDL 110, A1c 9.7, AST/ALT normal   Past Medical History:  Diagnosis Date  . Cardiomyopathy ([place])    a. 07/2009 MV: EF 48%; b. 08/2019 Echo: EF 45-50%. Global HK. Mod LVH. Gr1 DD. Nl RV fxn. Nl PASP.  Marland Kitchen Chest pain    a. 07/2009 MV: EF 48%. No ischemia.  . Chronic kidney disease (CKD), stage IV (severe) ([place])   . Coronary artery disease   . Diabetes mellitus without complication ([place])   . HFmrEF (heart failure with mildy reduced ejection fraction) ([place])   . Hypertension     Past Surgical History:  Procedure Laterality Date  . APPENDECTOMY    . CARDIAC CATHETERIZATION    . NASAL SINUS SURGERY    . RIGHT/LEFT HEART CATH AND CORONARY ANGIOGRAPHY N/A 07/19/2020   Procedure: RIGHT/LEFT HEART CATH AND CORONARY ANGIOGRAPHY;  Surgeon: Wellington Hampshire, MD;  Location: [facility];  Service: Cardiovascular;  Laterality: N/A;    Current Medications: Current Meds  Medication Sig  . amLODipine (NORVASC) 2.5 MG tablet Take 2.5 mg by mouth daily.  Marland Kitchen aspirin EC 81 MG EC tablet Take 1 tablet (81 mg total) by mouth daily. Swallow whole.  . Cholecalciferol (VITAMIN D3) 1.25 MG (50000 UT) CAPS Take by mouth once a week.  . clonazePAM (KLONOPIN) 0.25 MG disintegrating tablet Take 1 tablet (0.25 mg total) by mouth 2 (two) times daily as needed (anxiety).  Marland Kitchen FLUoxetine (PROZAC) 10 MG capsule Take 10 mg by mouth as needed.  . hydrALAZINE (APRESOLINE) 100 MG tablet Take 1 tablet (100 mg total) by mouth 2 (two) times daily.  . insulin detemir (LEVEMIR) 100 unit/ml SOLN Inject 35 Units into the skin daily.  . insulin lispro (HUMALOG) 100 UNIT/ML injection Inject 5 Units into the skin 3 (three) times daily before meals.  . isosorbide  mononitrate (IMDUR) 30 MG 24 hr tablet Take 30 mg by mouth daily.  . nitroGLYCERIN (NITROSTAT) 0.4 MG SL tablet Place 1 tablet (0.4 mg total) under the tongue every 5 (five) minutes as needed for chest pain.  . rosuvastatin (CRESTOR) 10 MG tablet Take 10 mg by mouth daily.  Marland Kitchen torsemide (DEMADEX) 20 MG tablet Take 20 mg by mouth daily as needed.  . [DISCONTINUED] bisoprolol (ZEBETA) 10 MG tablet Take 1 tablet (10 mg total) by mouth daily.    Allergies:   Patient has no known allergies.   Social History   Socioeconomic History  . Marital status: Married    Spouse name: Not on file  . Number of children: Not on file  . Years of education: Not on file  . Highest education level: Not on file  Occupational History  . Not on file  Tobacco Use  . Smoking status: Never Smoker  . Smokeless tobacco: Never Used  Vaping Use  . Vaping Use: Never used  Substance and Sexual Activity  . Alcohol use: Never  . Drug use: Never  . Sexual activity: Not on file  Other Topics Concern  . Not on file  Social History Narrative   Lives locally with wife and son.  He is currently unemployed - has worked in different industries.     Social Determinants of Health   Financial Resource Strain: Not on file  Food Insecurity: Not on file  Transportation Needs: Not on file  Physical Activity: Not on file  Stress: Not on file  Social Connections: Not on file     Family History:  The patient's family history includes Diabetes in his brother; Heart failure in his mother; Kidney failure in his mother; Other in his father.  ROS:   Review of Systems  Constitutional: Positive for malaise/fatigue. Negative for chills, diaphoresis, fever and weight loss.       Improving fatigue  HENT: Negative for congestion.   Eyes: Negative for discharge and redness.  Respiratory: Positive for shortness of breath. Negative for cough, sputum production and wheezing.   Cardiovascular: Positive for palpitations. Negative for  chest pain, orthopnea, claudication, leg swelling and PND.  Gastrointestinal: Negative for abdominal pain, heartburn, nausea and vomiting.  Musculoskeletal: Negative for falls and myalgias.  Skin: Negative for rash.  Neurological: Negative for dizziness, tingling, tremors, sensory change, speech change, focal weakness, loss of consciousness and weakness.  Endo/Heme/Allergies: Does not bruise/bleed easily.  Psychiatric/Behavioral: Negative for substance abuse. The patient is not nervous/anxious.   All other systems reviewed and are negative.    EKGs/Labs/Other Studies Reviewed:    Studies reviewed were summarized above. The additional studies were reviewed today:  R/LHC 06/2020:  Prox RCA lesion is 20% stenosed.  Mid RCA lesion is 30% stenosed.  Dist RCA lesion is 50% stenosed with 50% stenosed side branch in RPAV.  Prox LAD lesion is 40% stenosed.  1st Diag lesion is 70% stenosed.  Mid LAD lesion is 60% stenosed.  1. Moderately severely calcified coronary arteries with moderate two-vessel coronary artery disease involving the LAD and right coronary artery. No evidence of critical stenosis. 2. Right heart catheterization showed mildly elevated filling pressures, mild pulmonary hypertension and normal cardiac output. 3. Left ventricular angiography was not performed due to chronic kidney disease. EF was moderately reduced by echo.  Recommendations: Recommend aggressive medical therapy for nonobstructive coronary artery disease. Cardiomyopathy seems to be out of proportion to coronary artery disease. Monitor for contrast-induced nephropathy. Only 40 mL of contrast was used. We will do gentle hydration for 6 hours. I suspect that he will require an oral diuretic by tomorrow. __________  2D echo 06/2020: 1. Left ventricular ejection fraction, by estimation, is 30 to 35%. The  left ventricle has moderately decreased function. The left ventricle  demonstrates global  hypokinesis. The left ventricular internal cavity size  was moderately dilated. There is mild  left ventricular hypertrophy. Left ventricular diastolic parameters are  consistent with Grade II diastolic dysfunction (pseudonormalization). The  average left ventricular global longitudinal strain is -10.1 %. The global  longitudinal strain is abnormal.  2. Right ventricular systolic function is normal. The right ventricular  size is normal. Tricuspid regurgitation signal is inadequate for assessing  PA pressure.  3. Left atrial size was mildly dilated.  4. The inferior vena cava is normal in size with greater than 50%  respiratory variability, suggesting right atrial pressure of 3 mmHg. __________  2D echo 08/2019: 1. Left ventricular ejection fraction, by estimation, is 45  to 50%. The  left ventricle has mildly decreased function. The left ventricle  demonstrates global hypokinesis. There is moderate left ventricular  hypertrophy. Left ventricular diastolic  parameters are consistent with Grade I diastolic dysfunction (impaired  relaxation).  2. Right ventricular systolic function is normal. The right ventricular  size is normal. There is normal pulmonary artery systolic pressure.  __________  Treadmill Myoview 07/2009: 1. Normal treadmill EKG without evidence of ischemia or arrhythmia.  2. Good exercise tolerance for age.  3. Normal LV systolic function. Ejection fraction 48%.  4. Normal myocardial perfusion without evidence of myocardial  ischemia.    EKG:  EKG is ordered today.  The EKG ordered today demonstrates NSR, 78 bpm, nonspecific lateral ST-T changes which were previously noted  Recent Labs: 07/15/2020: ALT 15 07/16/2020: Magnesium 1.9 07/20/2020: B Natriuretic Peptide 584.1; Hemoglobin 9.5; Platelets 200 07/25/2020: BUN 60; Creatinine, Ser 3.73; Potassium 5.3; Sodium 136  Recent Lipid Panel    Component Value Date/Time   CHOL 186 07/16/2020 0532    TRIG 178 (H) 07/16/2020 0532   HDL 40 (L) 07/16/2020 0532   CHOLHDL 4.7 07/16/2020 0532   VLDL 36 07/16/2020 0532   LDLCALC 110 (H) 07/16/2020 0532    PHYSICAL EXAM:    VS:  BP 140/60 (BP Location: Left Arm, Patient Position: Sitting, Cuff Size: Normal)   Pulse 78   Ht '5\' 6"'$  (1.676 m)   Wt 157 lb 6 oz (71.4 kg)   SpO2 98%   BMI 25.40 kg/m   BMI: Body mass index is 25.4 kg/m.  Physical Exam Vitals reviewed.  Constitutional:      Appearance: He is well-developed.  HENT:     Head: Normocephalic and atraumatic.  Eyes:     General:        Right eye: No discharge.        Left eye: No discharge.  Neck:     Vascular: No JVD.  Cardiovascular:     Rate and Rhythm: Normal rate and regular rhythm.     Pulses: No midsystolic click and no opening snap.          Posterior tibial pulses are 2+ on the right side and 2+ on the left side.     Heart sounds: Normal heart sounds, S1 normal and S2 normal. Heart sounds not distant. No murmur heard. No friction rub.  Pulmonary:     Effort: Pulmonary effort is normal. No respiratory distress.     Breath sounds: Normal breath sounds. No decreased breath sounds, wheezing or rales.  Chest:     Chest wall: No tenderness.  Abdominal:     General: There is no distension.     Palpations: Abdomen is soft.     Tenderness: There is no abdominal tenderness.  Musculoskeletal:     Cervical back: Normal range of motion.  Skin:    General: Skin is warm and dry.     Nails: There is no clubbing.  Neurological:     Mental Status: He is alert and oriented to person, place, and time.  Psychiatric:        Speech: Speech normal.        Behavior: Behavior normal.        Thought Content: Thought content normal.        Judgment: Judgment normal.     Wt Readings from Last 3 Encounters:  09/13/20 157 lb 6 oz (71.4 kg)  09/06/20 152 lb (68.9 kg)  08/13/20 154 lb (69.9 kg)  ASSESSMENT & PLAN:   1. Chronic combined systolic and diastolic CHF secondary  to NICM: He appears euvolemic and well compensated with NYHA class II symptoms.  Titrate bisoprolol to 20 mg daily.  Otherwise, he will continue current GDMT including Imdur/hydralazine.  Not on an ACEi/ARB/ARNI/MRA/SGLT2i in the setting of CKD stage IV.  CHF education discussed in detail.  In several months time recommend follow-up echo on maximally tolerated GDMT to reevaluate his LV systolic function.  If his EF remains less than 35% at that time consider referral to EP for consideration of ICD.  Work restrictions deferred to [employer] CHF clinic.  2. Nonobstructive CAD: No symptoms concerning for angina.  Recent LHC demonstrated nonobstructive disease as outlined above.  Continue current medical therapy including aspirin, amlodipine, bisoprolol, Imdur, and rosuvastatin.  No indication for further ischemic testing at this time.  3. CKD stage IV: Improved on most recent check with nephrology.  Followed by nephrology, follow-up as directed.  4. HTN: Blood pressure is improved from prior readings following his recent admission though does remain mildly elevated at 140/60.  Titrate bisoprolol to 20 mg daily as outlined above.  Otherwise, he will continue low-dose amlodipine as well as hydralazine and Imdur.  He has not needed any as needed torsemide as outlined above.  Low-sodium diet is recommended.  5. HLD: LDL 110 in 06/2020 with normal LFT at that time.  Goal LDL less than 70.  He remains on rosuvastatin 10 mg daily (uncertain where dosage change occurred as he was previously on 40 mg).  Recommend follow-up fasting lipid panel and LFT at his next visit.  If LDL remains above goal at that time recommend reescalation of rosuvastatin to 40 mg daily.  6. Anemia of chronic disease: No symptoms concerning for bleeding.  Hgb stable.  Follow-up with PCP as directed.  7. Language barrier: [place] medical interpreter utilized for today's visit.  Disposition: F/u with Dr. Rockey Situ or an APP in 4  weeks.   Medication Adjustments/Labs and Tests Ordered: Current medicines are reviewed at length with the patient today.  Concerns regarding medicines are outlined above. Medication changes, Labs and Tests ordered today are summarized above and listed in the Patient Instructions accessible in Encounters.   SignedChristell Faith, PA-C 09/13/2020 11:00 AM     [address] [place], [place] [address] [phone]

## 2020-09-13 ENCOUNTER — Ambulatory Visit (INDEPENDENT_AMBULATORY_CARE_PROVIDER_SITE_OTHER): Payer: BC Managed Care – PPO | Admitting: Physician Assistant

## 2020-09-13 ENCOUNTER — Other Ambulatory Visit: Payer: Self-pay

## 2020-09-13 ENCOUNTER — Encounter: Payer: Self-pay | Admitting: Physician Assistant

## 2020-09-13 VITALS — BP 140/60 | HR 78 | Ht 66.0 in | Wt 157.4 lb

## 2020-09-13 DIAGNOSIS — N184 Chronic kidney disease, stage 4 (severe): Secondary | ICD-10-CM | POA: Diagnosis not present

## 2020-09-13 DIAGNOSIS — I2510 Atherosclerotic heart disease of native coronary artery without angina pectoris: Secondary | ICD-10-CM

## 2020-09-13 DIAGNOSIS — I10 Essential (primary) hypertension: Secondary | ICD-10-CM

## 2020-09-13 DIAGNOSIS — I5042 Chronic combined systolic (congestive) and diastolic (congestive) heart failure: Secondary | ICD-10-CM

## 2020-09-13 DIAGNOSIS — Z789 Other specified health status: Secondary | ICD-10-CM

## 2020-09-13 DIAGNOSIS — D638 Anemia in other chronic diseases classified elsewhere: Secondary | ICD-10-CM

## 2020-09-13 DIAGNOSIS — E785 Hyperlipidemia, unspecified: Secondary | ICD-10-CM

## 2020-09-13 MED ORDER — BISOPROLOL FUMARATE 10 MG PO TABS: 20.0000 mg | ORAL_TABLET | Freq: Every day | ORAL | 3 refills | Status: DC

## 2020-09-13 NOTE — Patient Instructions (Signed)
Medication Instructions:  Your physician has recommended you make the following change in your medication:   1. INCREASE Bisoprolol 10 mg and take 2 tablets (20 mg) once daily   *If you need a refill on your cardiac medications before your next appointment, please call your pharmacy*   Lab Work: None  If you have labs (blood work) drawn today and your tests are completely normal, you will receive your results only by: Marland Kitchen MyChart Message (if you have MyChart) OR . A paper copy in the mail If you have any lab test that is abnormal or we need to change your treatment, we will call you to review the results.   Testing/Procedures: None   Follow-Up: At [facility], you and your health needs are our priority.  As part of our continuing mission to provide you with exceptional heart care, we have created designated Provider Care Teams.  These Care Teams include your primary Cardiologist (physician) and Advanced Practice Providers (APPs -  Physician Assistants and Nurse Practitioners) who all work together to provide you with the care you need, when you need it.  We recommend signing up for the patient portal called "MyChart".  Sign up information is provided on this After Visit Summary.  MyChart is used to connect with patients for Virtual Visits (Telemedicine).  Patients are able to view lab/test results, encounter notes, upcoming appointments, etc.  Non-urgent messages can be sent to your provider as well.   To learn more about what you can do with MyChart, go to [email or web address].    Your next appointment:   1 month(s)  The format for your next appointment:   In Person  Provider:   Ida Rogue, MD or Christell Faith, PA-C

## 2020-09-19 DIAGNOSIS — Z20822 Contact with and (suspected) exposure to covid-19: Secondary | ICD-10-CM | POA: Diagnosis not present

## 2020-10-18 ENCOUNTER — Encounter: Payer: Self-pay | Admitting: Physician Assistant

## 2020-10-18 ENCOUNTER — Ambulatory Visit (INDEPENDENT_AMBULATORY_CARE_PROVIDER_SITE_OTHER): Payer: Self-pay | Admitting: Physician Assistant

## 2020-10-18 ENCOUNTER — Other Ambulatory Visit: Payer: Self-pay

## 2020-10-18 VITALS — BP 190/94 | HR 98 | Ht 66.0 in | Wt 155.0 lb

## 2020-10-18 DIAGNOSIS — I10 Essential (primary) hypertension: Secondary | ICD-10-CM

## 2020-10-18 DIAGNOSIS — E785 Hyperlipidemia, unspecified: Secondary | ICD-10-CM

## 2020-10-18 DIAGNOSIS — I5042 Chronic combined systolic (congestive) and diastolic (congestive) heart failure: Secondary | ICD-10-CM

## 2020-10-18 DIAGNOSIS — Z79899 Other long term (current) drug therapy: Secondary | ICD-10-CM

## 2020-10-18 DIAGNOSIS — I2510 Atherosclerotic heart disease of native coronary artery without angina pectoris: Secondary | ICD-10-CM

## 2020-10-18 DIAGNOSIS — N184 Chronic kidney disease, stage 4 (severe): Secondary | ICD-10-CM

## 2020-10-18 NOTE — Patient Instructions (Signed)
Medication Instructions:  Your physician has recommended you make the following change in your medication:   1. STOP Bisoprolol 2. START Carvedilol (Coreg) 6.25 mg twice a day. This is available at [facility] for $4.00  Medication management paperwork provided and please call them for appointment.  *If you need a refill on your cardiac medications before your next appointment, please call your pharmacy*   Lab Work: None today  If you have labs (blood work) drawn today and your tests are completely normal, you will receive your results only by: Marland Kitchen MyChart Message (if you have MyChart) OR . A paper copy in the mail If you have any lab test that is abnormal or we need to change your treatment, we will call you to review the results.   Testing/Procedures: None   Follow-Up: At [facility], you and your health needs are our priority.  As part of our continuing mission to provide you with exceptional heart care, we have created designated Provider Care Teams.  These Care Teams include your primary Cardiologist (physician) and Advanced Practice Providers (APPs -  Physician Assistants and Nurse Practitioners) who all work together to provide you with the care you need, when you need it.  We recommend signing up for the patient portal called "MyChart".  Sign up information is provided on this After Visit Summary.  MyChart is used to connect with patients for Virtual Visits (Telemedicine).  Patients are able to view lab/test results, encounter notes, upcoming appointments, etc.  Non-urgent messages can be sent to your provider as well.   To learn more about what you can do with MyChart, go to [email or web address].    Your next appointment:   2 month(s)  The format for your next appointment:   In Person  Provider:   Ida Rogue, MD or Christell Faith, PA-C

## 2020-10-18 NOTE — Progress Notes (Signed)
Cardiology Office Note    Date:  10/18/2020   ID:  Jimmy, Olson 1960-12-18, MRN [id number]  PCP:  [facility] [place]  Cardiologist:  Ida Rogue, MD  Electrophysiologist:  None   Chief Complaint: Follow-up  History of Present Illness:   Jimmy Olson is a 60 y.o. male with history of nonobstructive CAD by [place] in 06/2020, chronic combined systolic and diastolic CHF,NICM,CKD stage IV, Covid infection in 02/2020 status post monoclonal antibody infusion, DM2, HTN, HLD, anemia, and anxiety who presents for follow up of his cardiomyopathy.  Prior stress test in 2011 for chest pain was negative for significant ischemia. He was admitted to the hospital in 08/2019 with exertional chest pain with high-sensitivity troponin peaking at 199. Echo showed an EF of 45 to 50%, global hypokinesis, moderate LVH, and grade 1 diastolic dysfunction. His cardiomyopathy was felt to be in the setting of longstanding hypertension with recommendation to follow-up as an outpatient for Myoview. Following this admission he was lost to follow-up. More recently, he was admitted in 06/2020 with acute on chronic combined systolic and diastolic CHF complicated by acute on CKD stage IV.Echo on 07/16/2020 showed an EF of 30 to 35%, global hypokinesis, mildly dilated LV cavity size, mild LVH, grade 2 diastolic dysfunction, normal RV systolic function and ventricular cavity size, mildly dilated left atrium, and mild mitral valve regurgitation. R/LHC on 07/19/2020 showed moderately calcified coronary arteries with moderate two-vessel CAD involving the LAD and RCA as outlined below with no evidence of critical stenosis. RHC showed mildly elevated filling pressures, mild pulmonary hypertension, and normal cardiac output. His cardiomyopathy was out of proportion to his underlying CAD with medical management recommended. During his admission he developed acute on CKD stage IV with consideration for  hemodialysis during admission though this was deferred given slow improvement in his creatinine. During his admission he was evaluated by psychiatry with anxiety, depression, and suicidal ideation.   He was seen in this office in late 07/2020 and reported he had done well following discharge.  He was eager to return back to work.  He indicated his BP was typically running in the 0000000 to [id number] systolic.  He did continue to consume a diet high in sodium.  BP was mildly elevated at 153/84.  He was not taking previously prescribed amlodipine and was advised to start this along with continuation of his remaining medications including bisoprolol, hydralazine, and Imdur.  I last saw him in the office in 08/2020, at which time he was doing well from a cardiac perspective.  He was adherent to all medications.  He reported a BP typically of [id number] systolic at home with a BP in the office of 140/60.  He did continue to eat at a [place] buffet once per week.  Bisoprolol was titrated to 20 mg daily.  He comes in doing well from a cardiac perspective.  He continues to feel like his energy and functional status are improving and he is now back to close to baseline.  He has been out of bisoprolol for 3 days and indicates he cannot afford to refill this.  He reports his BP is typically 1 teens to 120s at home.  He does report adherence to amlodipine, hydralazine, Imdur, and Crestor.  He reports having to take torsemide approximately 3 days/week due to mild lower extremity swelling.  He has not yet taken any of his medications this morning.  He did miss his nephrology appointment last month.  He  remains out of work.   Labs independently reviewed: 07/2020 -potassium 5.3, BUN 60, serum creatinine 3.73, albumin 3.3, Hgb 10.3, PLT 206 06/2020-magnesium 1.9, TC 186, TG 178, HDL 40, LDL 110, A1c 9.7, AST/ALT normal    Past Medical History:  Diagnosis Date  . Cardiomyopathy ([place])    a. 07/2009 MV: EF 48%; b. 08/2019  Echo: EF 45-50%. Global HK. Mod LVH. Gr1 DD. Nl RV fxn. Nl PASP.  Marland Kitchen Chest pain    a. 07/2009 MV: EF 48%. No ischemia.  . Chronic kidney disease (CKD), stage IV (severe) ([place])   . Coronary artery disease   . Diabetes mellitus without complication ([place])   . HFmrEF (heart failure with mildy reduced ejection fraction) ([place])   . Hypertension     Past Surgical History:  Procedure Laterality Date  . APPENDECTOMY    . CARDIAC CATHETERIZATION    . NASAL SINUS SURGERY    . RIGHT/LEFT HEART CATH AND CORONARY ANGIOGRAPHY N/A 07/19/2020   Procedure: RIGHT/LEFT HEART CATH AND CORONARY ANGIOGRAPHY;  Surgeon: Wellington Hampshire, MD;  Location: [facility];  Service: Cardiovascular;  Laterality: N/A;    Current Medications: Current Meds  Medication Sig  . acetaminophen (TYLENOL) 325 MG tablet Take 325 mg by mouth every 6 (six) hours as needed.  Marland Kitchen amLODipine (NORVASC) 2.5 MG tablet Take 2.5 mg by mouth daily.  Marland Kitchen aspirin EC 81 MG EC tablet Take 1 tablet (81 mg total) by mouth daily. Swallow whole.  . cetirizine (ZYRTEC) 10 MG tablet Take 10 mg by mouth daily as needed.  . clonazePAM (KLONOPIN) 0.25 MG disintegrating tablet Take 1 tablet (0.25 mg total) by mouth 2 (two) times daily as needed (anxiety).  Marland Kitchen FLUoxetine (PROZAC) 10 MG capsule Take 10 mg by mouth as needed.  . hydrALAZINE (APRESOLINE) 100 MG tablet Take 1 tablet (100 mg total) by mouth 2 (two) times daily.  . insulin detemir (LEVEMIR) 100 unit/ml SOLN Inject 35 Units into the skin daily.  . insulin lispro (HUMALOG) 100 UNIT/ML injection Inject 5 Units into the skin 3 (three) times daily before meals.  . isosorbide mononitrate (IMDUR) 30 MG 24 hr tablet Take 30 mg by mouth daily.  Mable Fill (OPCON-A OP) Apply to eye as needed.  . nitroGLYCERIN (NITROSTAT) 0.4 MG SL tablet Place 1 tablet (0.4 mg total) under the tongue every 5 (five) minutes as needed for chest pain.  Marland Kitchen PROAIR HFA 108 (90 Base) MCG/ACT inhaler Inhale 2  puffs into the lungs every 4 (four) hours as needed.  . rosuvastatin (CRESTOR) 10 MG tablet Take 10 mg by mouth daily.  Marland Kitchen torsemide (DEMADEX) 20 MG tablet Take 20 mg by mouth daily as needed.    Allergies:   Patient has no known allergies.   Social History   Socioeconomic History  . Marital status: Married    Spouse name: Not on file  . Number of children: Not on file  . Years of education: Not on file  . Highest education level: Not on file  Occupational History  . Not on file  Tobacco Use  . Smoking status: Never Smoker  . Smokeless tobacco: Never Used  Vaping Use  . Vaping Use: Never used  Substance and Sexual Activity  . Alcohol use: Never  . Drug use: Never  . Sexual activity: Not on file  Other Topics Concern  . Not on file  Social History Narrative   Lives locally with wife and son.  He is currently unemployed -  has worked in different industries.     Social Determinants of Health   Financial Resource Strain: Not on file  Food Insecurity: Not on file  Transportation Needs: Not on file  Physical Activity: Not on file  Stress: Not on file  Social Connections: Not on file     Family History:  The patient's family history includes Diabetes in his brother; Heart failure in his mother; Kidney failure in his mother; Other in his father.  ROS:   Review of Systems  Constitutional: Negative for chills, diaphoresis, fever, malaise/fatigue and weight loss.  HENT: Negative for congestion.   Eyes: Negative for discharge and redness.  Respiratory: Negative for cough, sputum production, shortness of breath and wheezing.   Cardiovascular: Negative for chest pain, palpitations, orthopnea, claudication, leg swelling and PND.  Gastrointestinal: Negative for abdominal pain, heartburn, nausea and vomiting.  Musculoskeletal: Negative for falls and myalgias.  Skin: Negative for rash.  Neurological: Negative for dizziness, tingling, tremors, sensory change, speech change, focal  weakness, loss of consciousness and weakness.  Endo/Heme/Allergies: Does not bruise/bleed easily.  Psychiatric/Behavioral: Negative for substance abuse. The patient is not nervous/anxious.   All other systems reviewed and are negative.    EKGs/Labs/Other Studies Reviewed:    Studies reviewed were summarized above. The additional studies were reviewed today:  R/LHC 06/2020:  Prox RCA lesion is 20% stenosed.  Mid RCA lesion is 30% stenosed.  Dist RCA lesion is 50% stenosed with 50% stenosed side branch in RPAV.  Prox LAD lesion is 40% stenosed.  1st Diag lesion is 70% stenosed.  Mid LAD lesion is 60% stenosed.  1. Moderately severely calcified coronary arteries with moderate two-vessel coronary artery disease involving the LAD and right coronary artery. No evidence of critical stenosis. 2. Right heart catheterization showed mildly elevated filling pressures, mild pulmonary hypertension and normal cardiac output. 3. Left ventricular angiography was not performed due to chronic kidney disease. EF was moderately reduced by echo.  Recommendations: Recommend aggressive medical therapy for nonobstructive coronary artery disease. Cardiomyopathy seems to be out of proportion to coronary artery disease. Monitor for contrast-induced nephropathy. Only 40 mL of contrast was used. We will do gentle hydration for 6 hours. I suspect that he will require an oral diuretic by tomorrow. __________  2D echo 06/2020: 1. Left ventricular ejection fraction, by estimation, is 30 to 35%. The  left ventricle has moderately decreased function. The left ventricle  demonstrates global hypokinesis. The left ventricular internal cavity size  was moderately dilated. There is mild  left ventricular hypertrophy. Left ventricular diastolic parameters are  consistent with Grade II diastolic dysfunction (pseudonormalization). The  average left ventricular global longitudinal strain is -10.1 %. The  global  longitudinal strain is abnormal.  2. Right ventricular systolic function is normal. The right ventricular  size is normal. Tricuspid regurgitation signal is inadequate for assessing  PA pressure.  3. Left atrial size was mildly dilated.  4. The inferior vena cava is normal in size with greater than 50%  respiratory variability, suggesting right atrial pressure of 3 mmHg. __________  2D echo 08/2019: 1. Left ventricular ejection fraction, by estimation, is 45 to 50%. The  left ventricle has mildly decreased function. The left ventricle  demonstrates global hypokinesis. There is moderate left ventricular  hypertrophy. Left ventricular diastolic  parameters are consistent with Grade I diastolic dysfunction (impaired  relaxation).  2. Right ventricular systolic function is normal. The right ventricular  size is normal. There is normal pulmonary artery systolic pressure.  __________  Treadmill Myoview 07/2009: 1. Normal treadmill EKG without evidence of ischemia or arrhythmia.  2. Good exercise tolerance for age.  3. Normal LV systolic function. Ejection fraction 48%.  4. Normal myocardial perfusion without evidence of myocardial  ischemia.   EKG:  EKG is not ordered today.    Recent Labs: 07/15/2020: ALT 15 07/16/2020: Magnesium 1.9 07/20/2020: B Natriuretic Peptide 584.1; Hemoglobin 9.5; Platelets 200 07/25/2020: BUN 60; Creatinine, Ser 3.73; Potassium 5.3; Sodium 136  Recent Lipid Panel    Component Value Date/Time   CHOL 186 07/16/2020 0532   TRIG 178 (H) 07/16/2020 0532   HDL 40 (L) 07/16/2020 0532   CHOLHDL 4.7 07/16/2020 0532   VLDL 36 07/16/2020 0532   LDLCALC 110 (H) 07/16/2020 0532    PHYSICAL EXAM:    VS:  BP (!) 190/94 (BP Location: Left Arm, Patient Position: Sitting, Cuff Size: Normal)   Pulse 98   Ht '5\' 6"'$  (1.676 m)   Wt 155 lb (70.3 kg)   SpO2 97%   BMI 25.02 kg/m   BMI: Body mass index is 25.02 kg/m.  Physical Exam Vitals  reviewed.  Constitutional:      Appearance: He is well-developed.  HENT:     Head: Normocephalic and atraumatic.  Eyes:     General:        Right eye: No discharge.        Left eye: No discharge.  Neck:     Vascular: No JVD.  Cardiovascular:     Rate and Rhythm: Normal rate and regular rhythm.     Pulses: No midsystolic click and no opening snap.          Posterior tibial pulses are 2+ on the right side and 2+ on the left side.     Heart sounds: Normal heart sounds, S1 normal and S2 normal. Heart sounds not distant. No murmur heard. No friction rub.  Pulmonary:     Effort: Pulmonary effort is normal. No respiratory distress.     Breath sounds: Normal breath sounds. No decreased breath sounds, wheezing or rales.  Chest:     Chest wall: No tenderness.  Abdominal:     General: There is no distension.     Palpations: Abdomen is soft.     Tenderness: There is no abdominal tenderness.  Musculoskeletal:     Cervical back: Normal range of motion.  Skin:    General: Skin is warm and dry.     Nails: There is no clubbing.  Neurological:     Mental Status: He is alert and oriented to person, place, and time.  Psychiatric:        Speech: Speech normal.        Behavior: Behavior normal.        Thought Content: Thought content normal.        Judgment: Judgment normal.     Wt Readings from Last 3 Encounters:  10/18/20 155 lb (70.3 kg)  09/13/20 157 lb 6 oz (71.4 kg)  09/06/20 152 lb (68.9 kg)     ASSESSMENT & PLAN:   1. Chronic combined systolic and diastolic CHF secondary to NICM: He appears euvolemic and well compensated with NYHA class II symptoms.  His weight is down 2 pounds today when compared to his last clinic visit.  He has been without bisoprolol for 3 days and cannot afford to refill this medication.  We will transition him to carvedilol 6.25 mg twice daily.  Otherwise he will continue Imdur/hydralazine and  as needed torsemide.  He is not on an ACEi/ARB/ARNI/MRA/SGLT2i in  the setting of CKD stage IV.  CHF education was again discussed in detail.  In several months time, recommend repeating a limited echo to reevaluate his LV systolic function.  If his EF remains less than 35% at that time consider referral to EP for consideration of ICD, pending financial status.  2. Nonobstructive CAD: No symptoms concerning for angina.  Recent LHC demonstrated nonobstructive disease as outlined above.  Continue current medical therapy including aspirin, amlodipine, carvedilol, Imdur, and rosuvastatin.  No indication for further ischemic testing at this time.  3. CKD stage IV: He missed his nephrology appointment last month.  Advised him the importance of following up with them.  Recommend he contact their office to reschedule an appointment.  He indicates he will.    4. HTN: Blood pressure is elevated in the office today though he has been without bisoprolol for 3 days and has not yet taken any of his other antihypertensive medications.  We will start him on carvedilol as outlined above and continue him on amlodipine, hydralazine, Imdur, and as needed torsemide.  Low-sodium diet recommended.  He will contact our office if his BP remains elevated at home.  5. HLD: LDL 110 in 06/2020 with normal LFT at that time.  Goal LDL less than 70.  He remains on Crestor.  Labs are deferred at this time secondary to finances.  6. Anemia of chronic disease: No symptoms concerning for bleeding.  Last hemoglobin stable.  Follow-up with PCP as directed.  7. Language barrier: [place] medical interpreter utilized for today's visit.  8. Medication management: Patient has been without a job for several months now and has had difficulty in paying for his medications.  We will refer him to medication management to see if he qualifies for their program.  It is unclear if he would qualify for Medicaid.  Perhaps his PCP can assist with this.  Disposition: F/u with Dr. Rockey Situ or an APP in 2  months.   Medication Adjustments/Labs and Tests Ordered: Current medicines are reviewed at length with the patient today.  Concerns regarding medicines are outlined above. Medication changes, Labs and Tests ordered today are summarized above and listed in the Patient Instructions accessible in Encounters.   Signed, Christell Faith, PA-C 10/18/2020 11:14 AM     [address] [place], [place] [id number] [phone]

## 2020-12-19 NOTE — Progress Notes (Signed)
Cardiology Office Note    Date:  12/21/2020   ID:  Jimmy Olson, DOB [birth year], MRN [id number]  PCP:  [facility]  Cardiologist:  Ida Rogue, MD  Electrophysiologist:  None   Chief Complaint: Follow up  History of Present Illness:   Jimmy Olson is a 60 y.o. male with history of nonobstructive CAD by [place] in 06/2020, chronic combined systolic and diastolic CHF, NICM, CKD stage IV, Covid infection in 02/2020 status post monoclonal antibody infusion, DM2, HTN, HLD, anemia, and anxiety who presents for follow up of his cardiomyopathy.   Prior stress test in 2011 for chest pain was negative for significant ischemia.  He was admitted to the hospital in 08/2019 with exertional chest pain with high-sensitivity troponin peaking at 199.  Echo showed an EF of 45 to 50%, global hypokinesis, moderate LVH, and grade 1 diastolic dysfunction.  His cardiomyopathy was felt to be in the setting of longstanding hypertension with recommendation to follow-up as an outpatient for Myoview.  Following this admission he was lost to follow-up.  More recently, he was admitted in 06/2020 with acute on chronic combined systolic and diastolic CHF complicated by acute on CKD stage IV.  Echo on 07/16/2020 showed an EF of 30 to 35%, global hypokinesis, mildly dilated LV cavity size, mild LVH, grade 2 diastolic dysfunction, normal RV systolic function and ventricular cavity size, mildly dilated left atrium, and mild mitral valve regurgitation.  R/LHC on 07/19/2020 showed moderately calcified coronary arteries with moderate two-vessel CAD involving the LAD and RCA as outlined below with no evidence of critical stenosis.  RHC showed mildly elevated filling pressures, mild pulmonary hypertension, and normal cardiac output.  His cardiomyopathy was out of proportion to his underlying CAD with medical management recommended.  During his admission he developed acute on CKD stage IV with consideration for  hemodialysis during admission though this was deferred given slow improvement in his creatinine.  During his admission he was evaluated by psychiatry with anxiety, depression, and suicidal ideation.     He was seen in this office in late 07/2020 and reported he had done well following discharge.  He was eager to return back to work.  He indicated his BP was typically running in the 0000000 to [id number] systolic.  He did continue to consume a diet high in sodium.  BP was mildly elevated at 153/84.  He was not taking previously prescribed amlodipine and was advised to start this along with continuation of his remaining medications including bisoprolol, hydralazine, and Imdur.  In follow up in 08/2020, he was adherent to all medications.  He did continue to eat out at a [place] buffet once per week. Bisoprolol was titrated to 20 mg.  He was last seen in 10/2020 and was doing well, feeling this his functional status continued to improve. He had been out of bisoprolol for several days. BP was in the 1-teens to [id number] systolic at home.  He was taking torsemide approximately 3 days per week due to mild lower extremity swelling.  He was transitioned to carvedilol 6.25 mg.   He comes in today noting a 1 day history of rhinorrhea without fever, chills, sore throat, or congestion.  No chest pain, dyspnea, palpitations, dizziness, presyncope, or syncope.  He remains out of work secondary to exertional fatigue associated with his cardiomyopathy.  He has not worked since his hospitalization earlier this year.  He has not yet heard from a medication management.  In this setting,  he is out of most medications.  He did not start the above-mentioned carvedilol.  Currently, from a cardiac perspective, he is taking Imdur and hydralazine.  He has not needed any as needed torsemide, though does have this medication.  His weight is stable.  No lower extremity swelling, abdominal distention, orthopnea, PND, or early satiety.    Labs  independently reviewed: 07/2020 - potassium 5.3, BUN 60, serum creatinine 3.73, albumin 3.3, Hgb 10.3, PLT 206 06/2020 - magnesium 1.9, TC 186, TG 178, HDL 40, LDL 110, A1c 9.7, AST/ALT normal   Past Medical History:  Diagnosis Date   Cardiomyopathy ([place])    a. 07/2009 MV: EF 48%; b. 08/2019 Echo: EF 45-50%. Global HK. Mod LVH. Gr1 DD. Nl RV fxn. Nl PASP.   Chest pain    a. 07/2009 MV: EF 48%. No ischemia.   Chronic kidney disease (CKD), stage IV (severe) (HCC)    Coronary artery disease    Diabetes mellitus without complication (HCC)    HFmrEF (heart failure with mildy reduced ejection fraction) ([place])    Hypertension     Past Surgical History:  Procedure Laterality Date   APPENDECTOMY     CARDIAC CATHETERIZATION     NASAL SINUS SURGERY     RIGHT/LEFT HEART CATH AND CORONARY ANGIOGRAPHY N/A 07/19/2020   Procedure: RIGHT/LEFT HEART CATH AND CORONARY ANGIOGRAPHY;  Surgeon: Wellington Hampshire, MD;  Location: [facility];  Service: Cardiovascular;  Laterality: N/A;    Current Medications: Current Meds  Medication Sig   acetaminophen (TYLENOL) 325 MG tablet Take 325 mg by mouth every 6 (six) hours as needed.   carvedilol (COREG) 6.25 MG tablet Take 1 tablet (6.25 mg total) by mouth 2 (two) times daily.   clonazePAM (KLONOPIN) 0.25 MG disintegrating tablet Take 1 tablet (0.25 mg total) by mouth 2 (two) times daily as needed (anxiety).   D3-50 1.25 MG (50000 UT) capsule Take 50,000 Units by mouth once a week.   hydrALAZINE (APRESOLINE) 100 MG tablet Take 1 tablet (100 mg total) by mouth 2 (two) times daily.   insulin detemir (LEVEMIR) 100 unit/ml SOLN Inject 35 Units into the skin daily.   insulin lispro (HUMALOG) 100 UNIT/ML injection Inject 5 Units into the skin 3 (three) times daily before meals.   isosorbide mononitrate (IMDUR) 30 MG 24 hr tablet Take 30 mg by mouth daily.   nitroGLYCERIN (NITROSTAT) 0.4 MG SL tablet Place 1 tablet (0.4 mg total) under the tongue every 5 (five)  minutes as needed for chest pain.   PROAIR HFA 108 (90 Base) MCG/ACT inhaler Inhale 2 puffs into the lungs every 4 (four) hours as needed.   [DISCONTINUED] lisinopril (ZESTRIL) 10 MG tablet Take 10 mg by mouth daily.    Allergies:   Patient has no known allergies.   Social History   Socioeconomic History   Marital status: Married    Spouse name: Not on file   Number of children: Not on file   Years of education: Not on file   Highest education level: Not on file  Occupational History   Not on file  Tobacco Use   Smoking status: Never   Smokeless tobacco: Never  Vaping Use   Vaping Use: Never used  Substance and Sexual Activity   Alcohol use: Never   Drug use: Never   Sexual activity: Not on file  Other Topics Concern   Not on file  Social History Narrative   Lives locally with wife and son.  He  is currently unemployed - has worked in different industries.     Social Determinants of Health   Financial Resource Strain: Not on file  Food Insecurity: Not on file  Transportation Needs: Not on file  Physical Activity: Not on file  Stress: Not on file  Social Connections: Not on file     Family History:  The patient's family history includes Diabetes in his brother; Heart failure in his mother; Kidney failure in his mother; Other in his father.  ROS:   Review of Systems  Constitutional:  Positive for malaise/fatigue. Negative for chills, diaphoresis, fever and weight loss.  HENT:  Negative for congestion.        Rhinorrhea x1 day  Eyes:  Negative for discharge and redness.  Respiratory:  Negative for cough, sputum production, shortness of breath and wheezing.   Cardiovascular:  Negative for chest pain, palpitations, orthopnea, claudication, leg swelling and PND.  Gastrointestinal:  Negative for abdominal pain, blood in stool, heartburn, melena, nausea and vomiting.  Musculoskeletal:  Negative for falls and myalgias.  Skin:  Negative for rash.  Neurological:  Negative  for dizziness, tingling, tremors, sensory change, speech change, focal weakness, loss of consciousness and weakness.  Endo/Heme/Allergies:  Does not bruise/bleed easily.  Psychiatric/Behavioral:  Negative for substance abuse. The patient is not nervous/anxious.   All other systems reviewed and are negative.   EKGs/Labs/Other Studies Reviewed:    Studies reviewed were summarized above. The additional studies were reviewed today:  R/LHC 06/2020: Prox RCA lesion is 20% stenosed. Mid RCA lesion is 30% stenosed. Dist RCA lesion is 50% stenosed with 50% stenosed side branch in RPAV. Prox LAD lesion is 40% stenosed. 1st Diag lesion is 70% stenosed. Mid LAD lesion is 60% stenosed.   1.  Moderately severely calcified coronary arteries with moderate two-vessel coronary artery disease involving the LAD and right coronary artery.  No evidence of critical stenosis. 2.  Right heart catheterization showed mildly elevated filling pressures, mild pulmonary hypertension and normal cardiac output. 3.  Left ventricular angiography was not performed due to chronic kidney disease.  EF was moderately reduced by echo.   Recommendations: Recommend aggressive medical therapy for nonobstructive coronary artery disease.  Cardiomyopathy seems to be out of proportion to coronary artery disease. Monitor for contrast-induced nephropathy.  Only 40 mL of contrast was used.  We will do gentle hydration for 6 hours.  I suspect that he will require an oral diuretic by tomorrow. __________   2D echo 06/2020: 1. Left ventricular ejection fraction, by estimation, is 30 to 35%. The  left ventricle has moderately decreased function. The left ventricle  demonstrates global hypokinesis. The left ventricular internal cavity size  was moderately dilated. There is mild   left ventricular hypertrophy. Left ventricular diastolic parameters are  consistent with Grade II diastolic dysfunction (pseudonormalization). The  average left  ventricular global longitudinal strain is -10.1 %. The global  longitudinal strain is abnormal.   2. Right ventricular systolic function is normal. The right ventricular  size is normal. Tricuspid regurgitation signal is inadequate for assessing  PA pressure.   3. Left atrial size was mildly dilated.   4. The inferior vena cava is normal in size with greater than 50%  respiratory variability, suggesting right atrial pressure of 3 mmHg. __________   2D echo 08/2019: 1. Left ventricular ejection fraction, by estimation, is 45 to 50%. The  left ventricle has mildly decreased function. The left ventricle  demonstrates global hypokinesis. There is moderate left ventricular  hypertrophy. Left ventricular diastolic  parameters are consistent with Grade I diastolic dysfunction (impaired  relaxation).   2. Right ventricular systolic function is normal. The right ventricular  size is normal. There is normal pulmonary artery systolic pressure. __________   Treadmill Myoview 07/2009:  1.     Normal treadmill EKG without evidence of ischemia or arrhythmia. 2.     Good exercise tolerance for age. 3.     Normal LV systolic function. Ejection fraction 48%. 4.     Normal myocardial perfusion without evidence of myocardial ischemia.    EKG:  EKG is ordered today.  The EKG ordered today demonstrates NSR, 77 bpm, T wave version in aVL with no significant changes when compared to prior tracing  Recent Labs: 07/15/2020: ALT 15 07/16/2020: Magnesium 1.9 07/20/2020: B Natriuretic Peptide 584.1; Hemoglobin 9.5; Platelets 200 07/25/2020: BUN 60; Creatinine, Ser 3.73; Potassium 5.3; Sodium 136  Recent Lipid Panel    Component Value Date/Time   CHOL 186 07/16/2020 0532   TRIG 178 (H) 07/16/2020 0532   HDL 40 (L) 07/16/2020 0532   CHOLHDL 4.7 07/16/2020 0532   VLDL 36 07/16/2020 0532   LDLCALC 110 (H) 07/16/2020 0532    PHYSICAL EXAM:    VS:  BP (!) 190/90 (BP Location: Left Arm, Patient Position:  Sitting, Cuff Size: Normal)   Pulse 77   Ht '5\' 6"'$  (1.676 m)   Wt 155 lb 8 oz (70.5 kg)   SpO2 99%   BMI 25.10 kg/m   BMI: Body mass index is 25.1 kg/m.  Physical Exam Vitals reviewed.  Constitutional:      Appearance: He is well-developed.  HENT:     Head: Normocephalic and atraumatic.  Eyes:     General:        Right eye: No discharge.        Left eye: No discharge.  Neck:     Vascular: No JVD.  Cardiovascular:     Rate and Rhythm: Normal rate and regular rhythm.     Pulses:          Posterior tibial pulses are 2+ on the right side and 2+ on the left side.     Heart sounds: Normal heart sounds, S1 normal and S2 normal. Heart sounds not distant. No midsystolic click and no opening snap. No murmur heard.   No friction rub.  Pulmonary:     Effort: Pulmonary effort is normal. No respiratory distress.     Breath sounds: Normal breath sounds. No decreased breath sounds, wheezing or rales.  Chest:     Chest wall: No tenderness.  Abdominal:     General: There is no distension.     Palpations: Abdomen is soft.     Tenderness: There is no abdominal tenderness.  Musculoskeletal:     Cervical back: Normal range of motion.  Skin:    General: Skin is warm and dry.     Nails: There is no clubbing.  Neurological:     Mental Status: He is alert and oriented to person, place, and time.  Psychiatric:        Speech: Speech normal.        Behavior: Behavior normal.        Thought Content: Thought content normal.        Judgment: Judgment normal.    Wt Readings from Last 3 Encounters:  12/21/20 155 lb 8 oz (70.5 kg)  10/18/20 155 lb (70.3 kg)  09/13/20 157 lb 6 oz (71.4 kg)  ASSESSMENT & PLAN:   Chronic combined systolic and diastolic CHF secondary to NICM: He appears euvolemic and well compensated with NYHA class II-III symptoms.  Likely in the setting of hypertensive heart disease.  Since he was last seen he was unable to pick up carvedilol secondary to finances.  He has not  heard from medication management.  He does remain on Imdur/hydralazine and as needed torsemide.  We will place him on carvedilol 6.25 mg twice daily, which will be $4 at [facility] per good Rx.  Not on an ACEi/ARB/ARN I/MRA/SGLT2i secondary to CKD stage IV.  Following several months on maximally tolerated GDMT, we will plan to pursue a repeat limited echo to evaluate his LV systolic function.  If his EF remains less than 35% at that time we will consider referral to EP for consideration of ICD.  Weight is stable.  CHF education.  Nonobstructive CAD: No symptoms concerning for angina.  Recent LHC demonstrated nonobstructive disease as outlined above.  Continue aspirin and Imdur.  Restart carvedilol.  He has been unable to afford rosuvastatin.  We will again reach out to medication management as outlined below for medication assistance in an effort to continue evidence-based medical therapy.  No indication for further ischemic testing at this time.  CKD stage IV: Reports he has been unable to establish with nephrology secondary to prior bill.  Encouraged him to contact their office to discuss this.  In the interim, we will check a BMP.  Hypertensive heart disease: Blood pressure remains suboptimally controlled at 190/90, in the setting of running out of medications secondary to finances.  He does indicate he will be able to afford carvedilol 6.25 mg twice daily at [facility], which will be $4 per good Rx.  Low-sodium diet.  Escalate evidence-based medical therapy as assistance through medication management allows.  HLD: LDL 110 in 06/2020 with normal LFT at that time.  Goal LDL < 70.  Not currently on statin therapy secondary to financial constraints.  We have reached out to medication management for assistance again.  Resume statin therapy as able.  Anemia of chronic disease: No symptoms concerning for bleeding.  Stable CBC on last check.  Follow-up with Philis Pique as directed.  Language barrier: [facility] medical interpreter utilized for today's visit.  IDDM: Per PCP.   Medication management: He has been out of work since his hospitalization in 07/2020 and cannot afford medications.  He is out of all medications except for as needed albuterol, hydralazine, Levemir, Humalog, Imdur, and as needed SL NTG.  He was previously referred to medication management at his last visit though indicates he has not heard from them.  We will contact their office today for an update.  Patient indicates he will also go by there today after his visit to discuss getting set up with their program.  Management of his underlying comorbid conditions will continue to be significantly difficult without assistance.   Disposition: F/u with Dr. Rockey Situ or an APP in 1 month.   Medication Adjustments/Labs and Tests Ordered: Current medicines are reviewed at length with the patient today.  Concerns regarding medicines are outlined above. Medication changes, Labs and Tests ordered today are summarized above and listed in the Patient Instructions accessible in Encounters.   Signed, Christell Faith, PA-C 12/21/2020 10:47 AM     [facility] [address] [place], [place] [address] [phone]

## 2020-12-21 ENCOUNTER — Other Ambulatory Visit: Payer: Self-pay

## 2020-12-21 ENCOUNTER — Encounter: Payer: Self-pay | Admitting: Physician Assistant

## 2020-12-21 ENCOUNTER — Ambulatory Visit (INDEPENDENT_AMBULATORY_CARE_PROVIDER_SITE_OTHER): Payer: Self-pay | Admitting: Physician Assistant

## 2020-12-21 VITALS — BP 190/90 | HR 77 | Ht 66.0 in | Wt 155.5 lb

## 2020-12-21 DIAGNOSIS — I110 Hypertensive heart disease with heart failure: Secondary | ICD-10-CM

## 2020-12-21 DIAGNOSIS — Z794 Long term (current) use of insulin: Secondary | ICD-10-CM

## 2020-12-21 DIAGNOSIS — I5042 Chronic combined systolic (congestive) and diastolic (congestive) heart failure: Secondary | ICD-10-CM

## 2020-12-21 DIAGNOSIS — N184 Chronic kidney disease, stage 4 (severe): Secondary | ICD-10-CM

## 2020-12-21 DIAGNOSIS — E785 Hyperlipidemia, unspecified: Secondary | ICD-10-CM

## 2020-12-21 DIAGNOSIS — E1122 Type 2 diabetes mellitus with diabetic chronic kidney disease: Secondary | ICD-10-CM

## 2020-12-21 DIAGNOSIS — I2510 Atherosclerotic heart disease of native coronary artery without angina pectoris: Secondary | ICD-10-CM

## 2020-12-21 DIAGNOSIS — I428 Other cardiomyopathies: Secondary | ICD-10-CM

## 2020-12-21 DIAGNOSIS — D638 Anemia in other chronic diseases classified elsewhere: Secondary | ICD-10-CM

## 2020-12-21 DIAGNOSIS — Z789 Other specified health status: Secondary | ICD-10-CM

## 2020-12-21 MED ORDER — CARVEDILOL 6.25 MG PO TABS: 6.2500 mg | ORAL_TABLET | Freq: Two times a day (BID) | ORAL | 6 refills | Status: DC

## 2020-12-21 NOTE — Patient Instructions (Addendum)
Medication Instructions:  Your physician has recommended you make the following change in your medication:   START Coreg (Carvedilol) 6.25 mg take 1 tablet twice a day.  [facility] has this medication for $4 using goodrx.   *If you need a refill on your cardiac medications before your next appointment, please call your pharmacy*   Lab Work: BMET today  If you have labs (blood work) drawn today and your tests are completely normal, you will receive your results only by: [place] (if you have MyChart) OR A paper copy in the mail If you have any lab test that is abnormal or we need to change your treatment, we will call you to review the results.   Testing/Procedures: None   Follow-Up: At [facility], you and your health needs are our priority.  As part of our continuing mission to provide you with exceptional heart care, we have created designated Provider Care Teams.  These Care Teams include your primary Cardiologist (physician) and Advanced Practice Providers (APPs -  Physician Assistants and Nurse Practitioners) who all work together to provide you with the care you need, when you need it.  We recommend signing up for the patient portal called "MyChart".  Sign up information is provided on this After Visit Summary.  MyChart is used to connect with patients for Virtual Visits (Telemedicine).  Patients are able to view lab/test results, encounter notes, upcoming appointments, etc.  Non-urgent messages can be sent to your provider as well.   To learn more about what you can do with MyChart, go to [email or web address].    Your next appointment:   1 month(s)  The format for your next appointment:   In Person  Provider:   Ida Rogue, MD or Christell Faith, PA-C

## 2020-12-22 LAB — BASIC METABOLIC PANEL
BUN/Creatinine Ratio: 14 (ref 9–20)
BUN: 51 mg/dL — ABNORMAL HIGH (ref 6–24)
CO2: 18 mmol/L — ABNORMAL LOW (ref 20–29)
Calcium: 9.5 mg/dL (ref 8.7–10.2)
Chloride: 111 mmol/L — ABNORMAL HIGH (ref 96–106)
Creatinine, Ser: 3.73 mg/dL — ABNORMAL HIGH (ref 0.76–1.27)
Glucose: 161 mg/dL — ABNORMAL HIGH (ref 65–99)
Potassium: 5.9 mmol/L — ABNORMAL HIGH (ref 3.5–5.2)
Sodium: 141 mmol/L (ref 134–144)
eGFR: 18 mL/min/{1.73_m2} — ABNORMAL LOW (ref >59)

## 2020-12-23 ENCOUNTER — Other Ambulatory Visit: Payer: Self-pay

## 2020-12-23 ENCOUNTER — Observation Stay
Admission: EM | Admit: 2020-12-23 | Discharge: 2020-12-24 | Disposition: A | Discharge status: Home / Self Care | Payer: Self-pay | Attending: Hospitalist | Admitting: Hospitalist

## 2020-12-23 ENCOUNTER — Telehealth: Payer: Self-pay | Admitting: *Deleted

## 2020-12-23 ENCOUNTER — Encounter: Payer: Self-pay | Admitting: Emergency Medicine

## 2020-12-23 ENCOUNTER — Observation Stay: Payer: Self-pay

## 2020-12-23 ENCOUNTER — Encounter: Payer: Self-pay | Admitting: Physician Assistant

## 2020-12-23 ENCOUNTER — Other Ambulatory Visit
Admission: RE | Admit: 2020-12-23 | Discharge: 2020-12-23 | Disposition: A | Discharge status: Home / Self Care | Payer: Self-pay | Attending: Physician Assistant | Admitting: Physician Assistant

## 2020-12-23 ENCOUNTER — Emergency Department: Payer: Self-pay

## 2020-12-23 DIAGNOSIS — I2510 Atherosclerotic heart disease of native coronary artery without angina pectoris: Secondary | ICD-10-CM | POA: Insufficient documentation

## 2020-12-23 DIAGNOSIS — Z794 Long term (current) use of insulin: Secondary | ICD-10-CM | POA: Insufficient documentation

## 2020-12-23 DIAGNOSIS — I25118 Atherosclerotic heart disease of native coronary artery with other forms of angina pectoris: Secondary | ICD-10-CM | POA: Diagnosis present

## 2020-12-23 DIAGNOSIS — Z20822 Contact with and (suspected) exposure to covid-19: Secondary | ICD-10-CM | POA: Insufficient documentation

## 2020-12-23 DIAGNOSIS — D631 Anemia in chronic kidney disease: Secondary | ICD-10-CM | POA: Insufficient documentation

## 2020-12-23 DIAGNOSIS — E1169 Type 2 diabetes mellitus with other specified complication: Secondary | ICD-10-CM | POA: Diagnosis present

## 2020-12-23 DIAGNOSIS — E1159 Type 2 diabetes mellitus with other circulatory complications: Secondary | ICD-10-CM | POA: Diagnosis present

## 2020-12-23 DIAGNOSIS — E875 Hyperkalemia: Secondary | ICD-10-CM | POA: Insufficient documentation

## 2020-12-23 DIAGNOSIS — I152 Hypertension secondary to endocrine disorders: Secondary | ICD-10-CM | POA: Diagnosis present

## 2020-12-23 DIAGNOSIS — N184 Chronic kidney disease, stage 4 (severe): Secondary | ICD-10-CM | POA: Insufficient documentation

## 2020-12-23 DIAGNOSIS — E1129 Type 2 diabetes mellitus with other diabetic kidney complication: Secondary | ICD-10-CM | POA: Diagnosis present

## 2020-12-23 DIAGNOSIS — N189 Chronic kidney disease, unspecified: Secondary | ICD-10-CM

## 2020-12-23 DIAGNOSIS — I5042 Chronic combined systolic (congestive) and diastolic (congestive) heart failure: Secondary | ICD-10-CM | POA: Insufficient documentation

## 2020-12-23 DIAGNOSIS — Z79899 Other long term (current) drug therapy: Secondary | ICD-10-CM | POA: Insufficient documentation

## 2020-12-23 DIAGNOSIS — E1165 Type 2 diabetes mellitus with hyperglycemia: Secondary | ICD-10-CM | POA: Diagnosis present

## 2020-12-23 DIAGNOSIS — I5043 Acute on chronic combined systolic (congestive) and diastolic (congestive) heart failure: Secondary | ICD-10-CM | POA: Diagnosis present

## 2020-12-23 DIAGNOSIS — E1122 Type 2 diabetes mellitus with diabetic chronic kidney disease: Secondary | ICD-10-CM | POA: Insufficient documentation

## 2020-12-23 DIAGNOSIS — E785 Hyperlipidemia, unspecified: Secondary | ICD-10-CM | POA: Diagnosis present

## 2020-12-23 DIAGNOSIS — R911 Solitary pulmonary nodule: Secondary | ICD-10-CM | POA: Insufficient documentation

## 2020-12-23 DIAGNOSIS — I130 Hypertensive heart and chronic kidney disease with heart failure and stage 1 through stage 4 chronic kidney disease, or unspecified chronic kidney disease: Secondary | ICD-10-CM | POA: Insufficient documentation

## 2020-12-23 HISTORY — DX: Hyperkalemia: E87.5

## 2020-12-23 LAB — BRAIN NATRIURETIC PEPTIDE: B Natriuretic Peptide: 121 pg/mL — ABNORMAL HIGH (ref 0.0–100.0)

## 2020-12-23 LAB — POTASSIUM: Potassium: 5.7 mmol/L — ABNORMAL HIGH (ref 3.5–5.1)

## 2020-12-23 LAB — CBG MONITORING, ED
Glucose-Capillary: 150 mg/dL — ABNORMAL HIGH (ref 70–99)
Glucose-Capillary: 128 mg/dL — ABNORMAL HIGH (ref 70–99)

## 2020-12-23 LAB — BASIC METABOLIC PANEL
Anion gap: 7 (ref 5–15)
BUN: 65 mg/dL — ABNORMAL HIGH (ref 6–20)
CO2: 19 mmol/L — ABNORMAL LOW (ref 22–32)
Calcium: 9 mg/dL (ref 8.9–10.3)
Chloride: 109 mmol/L (ref 98–111)
Creatinine, Ser: 4.07 mg/dL — ABNORMAL HIGH (ref 0.61–1.24)
GFR, Estimated: 16 mL/min — ABNORMAL LOW (ref >60)
Glucose, Bld: 321 mg/dL — ABNORMAL HIGH (ref 70–99)
Potassium: 5.9 mmol/L — ABNORMAL HIGH (ref 3.5–5.1)
Sodium: 135 mmol/L (ref 135–145)

## 2020-12-23 LAB — CBC
HCT: 27.3 % — ABNORMAL LOW (ref 39.0–52.0)
Hemoglobin: 8.9 g/dL — ABNORMAL LOW (ref 13.0–17.0)
MCH: 29.8 pg (ref 26.0–34.0)
MCHC: 32.6 g/dL (ref 30.0–36.0)
MCV: 91.3 fL (ref 80.0–100.0)
Platelets: 230 10*3/uL (ref 150–400)
RBC: 2.99 MIL/uL — ABNORMAL LOW (ref 4.22–5.81)
RDW: 12.2 % (ref 11.5–15.5)
WBC: 4.5 10*3/uL (ref 4.0–10.5)
nRBC: 0 % (ref 0.0–0.2)

## 2020-12-23 LAB — TROPONIN I (HIGH SENSITIVITY)
Troponin I (High Sensitivity): 69 ng/L — ABNORMAL HIGH (ref <18)
Troponin I (High Sensitivity): 69 ng/L — ABNORMAL HIGH (ref <18)

## 2020-12-23 MED ORDER — CARVEDILOL 6.25 MG PO TABS
6.2500 mg | ORAL_TABLET | Freq: Two times a day (BID) | ORAL | Status: DC
  Administered 2020-12-24: 6.25 mg via ORAL
  Filled 2020-12-23: qty 1

## 2020-12-23 MED ORDER — AMLODIPINE BESYLATE 5 MG PO TABS
2.5000 mg | ORAL_TABLET | Freq: Every day | ORAL | Status: DC
  Administered 2020-12-23 – 2020-12-24 (×2): 2.5 mg via ORAL
  Filled 2020-12-23 (×2): qty 1

## 2020-12-23 MED ORDER — ISOSORBIDE MONONITRATE ER 60 MG PO TB24: 30.0000 mg | ORAL_TABLET | Freq: Every day | ORAL | Status: DC

## 2020-12-23 MED ORDER — HEPARIN SODIUM (PORCINE) 5000 UNIT/ML IJ SOLN
5000.0000 [IU] | Freq: Three times a day (TID) | INTRAMUSCULAR | Status: DC
  Administered 2020-12-23 – 2020-12-24 (×2): 5000 [IU] via SUBCUTANEOUS
  Filled 2020-12-23 (×2): qty 1

## 2020-12-23 MED ORDER — PATIROMER SORBITEX CALCIUM 8.4 G PO PACK
8.4000 g | PACK | Freq: Once | ORAL | Status: AC
  Administered 2020-12-23: 8.4 g via ORAL
  Filled 2020-12-23: qty 1

## 2020-12-23 MED ORDER — ACETAMINOPHEN 650 MG RE SUPP: 650.0000 mg | Freq: Four times a day (QID) | RECTAL | Status: DC | PRN

## 2020-12-23 MED ORDER — ALBUTEROL SULFATE (2.5 MG/3ML) 0.083% IN NEBU: 3.0000 mL | INHALATION_SOLUTION | RESPIRATORY_TRACT | Status: DC | PRN

## 2020-12-23 MED ORDER — INSULIN ASPART 100 UNIT/ML IJ SOLN: 0.0000 [IU] | Freq: Every day | INTRAMUSCULAR | Status: DC

## 2020-12-23 MED ORDER — ACETAMINOPHEN 325 MG PO TABS: 650.0000 mg | ORAL_TABLET | Freq: Four times a day (QID) | ORAL | Status: DC | PRN

## 2020-12-23 MED ORDER — ONDANSETRON HCL 4 MG PO TABS: 4.0000 mg | ORAL_TABLET | Freq: Four times a day (QID) | ORAL | Status: DC | PRN

## 2020-12-23 MED ORDER — INSULIN ASPART 100 UNIT/ML IJ SOLN
0.0000 [IU] | Freq: Three times a day (TID) | INTRAMUSCULAR | Status: DC
  Administered 2020-12-24: 2 [IU] via SUBCUTANEOUS
  Filled 2020-12-23: qty 1

## 2020-12-23 MED ORDER — SODIUM CHLORIDE 0.9% FLUSH
3.0000 mL | Freq: Two times a day (BID) | INTRAVENOUS | Status: DC
  Administered 2020-12-24 (×2): 3 mL via INTRAVENOUS

## 2020-12-23 MED ORDER — ASPIRIN EC 81 MG PO TBEC
81.0000 mg | DELAYED_RELEASE_TABLET | Freq: Every day | ORAL | Status: DC
  Administered 2020-12-23 – 2020-12-24 (×2): 81 mg via ORAL
  Filled 2020-12-23 (×2): qty 1

## 2020-12-23 MED ORDER — ATORVASTATIN CALCIUM 20 MG PO TABS
40.0000 mg | ORAL_TABLET | Freq: Every day | ORAL | Status: DC
  Administered 2020-12-23: 40 mg via ORAL
  Filled 2020-12-23: qty 2

## 2020-12-23 MED ORDER — ONDANSETRON HCL 4 MG/2ML IJ SOLN: 4.0000 mg | Freq: Four times a day (QID) | INTRAMUSCULAR | Status: DC | PRN

## 2020-12-23 MED ORDER — LABETALOL HCL 5 MG/ML IV SOLN: 10.0000 mg | INTRAVENOUS | Status: DC | PRN

## 2020-12-23 MED ORDER — NITROGLYCERIN 0.4 MG SL SUBL: 0.4000 mg | SUBLINGUAL_TABLET | SUBLINGUAL | Status: DC | PRN

## 2020-12-23 MED ORDER — SENNOSIDES-DOCUSATE SODIUM 8.6-50 MG PO TABS: 1.0000 | ORAL_TABLET | Freq: Every evening | ORAL | Status: DC | PRN

## 2020-12-23 MED ORDER — INSULIN DETEMIR 100 UNIT/ML ~~LOC~~ SOLN
35.0000 [IU] | Freq: Every day | SUBCUTANEOUS | Status: DC
  Administered 2020-12-24: 35 [IU] via SUBCUTANEOUS
  Filled 2020-12-23 (×2): qty 0.35

## 2020-12-23 MED ORDER — HYDRALAZINE HCL 50 MG PO TABS
100.0000 mg | ORAL_TABLET | Freq: Two times a day (BID) | ORAL | Status: DC
  Administered 2020-12-23 – 2020-12-24 (×2): 100 mg via ORAL
  Filled 2020-12-23 (×2): qty 2

## 2020-12-23 NOTE — Telephone Encounter (Signed)
Spoke with patient using interpreter services [place] ID # [id number]. Reviewed results and recommendations with patient. He states that he is not able to go because of the bill. Advised that when he checks in if they tell him to pay to tell them he needs to be billed. Discussed the importance of having these labs done today. He stated that "why he needs to do this because he cannot pay and that he is going to die anyway". Again explained the importance of having this done. He then wanted address to our location and provided him with address and to arrive at the [facility] Entr[facility] with their address and had interpreter spell out the address in detail for him to write down. They reviewed this 3 times. Patient was agreeable to go for this lab to be done. He stated again that he is not able to pay for his medication and his blood pressure was 157 today. Discussed importance of trying to get assistance so he can take needed medications. Requested that he please go today to have this done and if possible to try and go within the next 2 hours. He stated he was on his way. Patient did verbalize understanding. Will update provider on this call and request for labs.

## 2020-12-23 NOTE — ED Notes (Signed)
Finger stick blood sugar was 128

## 2020-12-23 NOTE — ED Notes (Signed)
Of note, when applying cardiac electrodes pt's pectoral muscles are noted to be rolling with twitches/spasms which he states has been happening intermittently throughout the day. He took himself to the dr this morning because he was feeling weak and dizzy, and they sent him here due to elevated K+ levels. RN informed MD in duty of pt symptoms and repeated EKG.

## 2020-12-23 NOTE — Telephone Encounter (Signed)
Patient called using interpreter services [place] ID # [phone]. Patient did not answer and there is no voicemail available to leave message.

## 2020-12-23 NOTE — ED Notes (Signed)
Patient returned from CT

## 2020-12-23 NOTE — Telephone Encounter (Signed)
Spoke with patient using interpreter services [place] with ID# [phone]. Reviewed results and recommendations with patient multiple times. After repeating these instructions patient states he is sitting in his car with air conditioner on and feels like he cannot breathe. Inquired if he wanted to pick up medication or should he proceed to ED. He then stated where he should go. Advised that I would call ahead to make them aware he is coming in by private vehicle will require interpreter. Patient verbalized understanding and would head to ED.   Spoke with Audelia Acton RN charge nurse in ED and updated her on patient arriving and will need interpreter services. Reviewed that potassium is elevated, he is reporting shortness of breath, and will be coming in for treatment. She verbalized understanding.

## 2020-12-23 NOTE — Telephone Encounter (Signed)
-----   Message from Rise Mu, PA-C sent at 12/23/2020  2:48 PM EDT ----- Repeat potassium remains elevated though is improved from prior value, with known CKD stage IV.  Recommendations: -With his underlying CKD, I again recommended that he contact nephrology to be seen.  He indicated he was unable to see them as he had an outstanding bill.  Perhaps he can discuss this with them so he can establish care.  -With regards to his hyperkalemia, he is not on any medications that could contribute.  This is likely in the setting of his underlying renal disease.  Please ensure he is not using seasonings or salt substitutes that are high in potassium.  -At this point, given persistent hyperkalemia, we have 2 options:    1) Lokelma 10 g oral x1 to be taken today with a repeat BMP drawn within the next 24 to 48 hours  2) Proceed to the ED for further management and treatment of hyperkalemia

## 2020-12-23 NOTE — Progress Notes (Signed)
Clinical staff called the patient to report his persistent hyperkalemia and recommendations. He declined treatment with Lokelma. At that time, he reported to RN that he was preparing to go to the ED for new onset SOB, which had not been mentioned to [place] or evaluated by [place]. Office is calling the ED to notify.

## 2020-12-23 NOTE — Telephone Encounter (Signed)
-----   Message from Rise Mu, PA-C sent at 12/23/2020  7:22 AM EDT ----- Random glucose is elevated with known diabetes Renal function remains elevated, and is consistent with prior readings with known CKD stage IV Potassium is elevated  -He is not on any medications that can lead to hyperkalemia  -Recheck stat potassium today in the [facility], if this remains elevated he will either need Lokelma or to go to the ED given his underlying CKD

## 2020-12-23 NOTE — ED Triage Notes (Signed)
Pt comes into the ED via POV c/o abnormal labs.  Pt states via interpreter that his Potassium level is high.  Pt unsure what the exact number was, but was told by his physician to come in.  Pt states the blood was drawn this morning.  Pt denies any symptoms including CP, or dizziness.  PT does admit to [facility].  PT in NAD at this time with even and unlabored respirations.

## 2020-12-23 NOTE — ED Notes (Signed)
Patient transported to CT 

## 2020-12-23 NOTE — ED Provider Notes (Signed)
[facility] Emergency Department Provider Note   ____________________________________________   Event Date/Time   First MD Initiated Contact with Patient 12/23/20 1840     (approximate)  I have reviewed the triage vital signs and the nursing notes.   HISTORY  Chief Complaint Abnormal Lab    HPI Jimmy Olson is a 60 y.o. male with past medical history of hypertension, hyperlipidemia, diabetes, CAD, CHF, and CKD who presents to the ED for abnormal labs.  Patient states he was called by his cardiologist today and told that his potassium was high and he needs to go to the ER.  Patient states that he has been feeling bad ever since he left the hospital in February.  He complains of generalized weakness, fatigue, shortness of breath, and malaise.  He states he has had occasional sharp pain in the left side of his chest but describes this as mild and not present currently.  He denies any fevers, cough, pain or swelling in his legs.  He is prescribed Lasix by his cardiology team, but states his urine output has been decreased recently and he was told to stop taking this 2 days ago.  He has not noticed significantly increased swelling in his legs.  He was previously told that he might need dialysis during admission in February, but kidney function subsequently improved.  He has not followed up with nephrology since then.        Past Medical History:  Diagnosis Date   Cardiomyopathy ([place])    a. 07/2009 MV: EF 48%; b. 08/2019 Echo: EF 45-50%. Global HK. Mod LVH. Gr1 DD. Nl RV fxn. Nl PASP.   Chest pain    a. 07/2009 MV: EF 48%. No ischemia.   Chronic kidney disease (CKD), stage IV (severe) (HCC)    Coronary artery disease    Diabetes mellitus without complication (HCC)    HFmrEF (heart failure with mildy reduced ejection fraction) ([place])    Hypertension     Patient Active Problem List   Diagnosis Date Noted   Coronary artery disease involving native coronary artery  of native heart without angina pectoris    Demand ischemia (HCC)    ATN (acute tubular necrosis) (HCC)    Severe recurrent major depression without psychotic features ([place]) 07/16/2020   Elevated troponin 07/15/2020   Type II diabetes mellitus with renal manifestations ([place]) 07/15/2020   Acute on chronic HFrEF (heart failure with reduced ejection fraction) ([place]) 07/15/2020   Acute renal failure superimposed on stage 4 chronic kidney disease ([place]) 07/15/2020   HLD (hyperlipidemia) 07/15/2020   HFrEF (heart failure with reduced ejection fraction) ([place])    Essential hypertension    Acute on chronic respiratory failure with hypoxia ([place]) 09/13/2019   CAP (community acquired pneumonia) 09/11/2019   Acute respiratory failure with hypercapnia ([place]) 09/11/2019   Accelerated hypertension 09/11/2019   Type 2 diabetes mellitus with hyperlipidemia ([place]) 09/11/2019   Type 2 diabetes mellitus with other specified complication ([place]) [id number]   AKI (acute kidney injury) ([place]) 09/11/2019    Past Surgical History:  Procedure Laterality Date   APPENDECTOMY     CARDIAC CATHETERIZATION     NASAL SINUS SURGERY     RIGHT/LEFT HEART CATH AND CORONARY ANGIOGRAPHY N/A 07/19/2020   Procedure: RIGHT/LEFT HEART CATH AND CORONARY ANGIOGRAPHY;  Surgeon: Wellington Hampshire, MD;  Location: [facility];  Service: Cardiovascular;  Laterality: N/A;    Prior to Admission medications   Medication Sig Start Date End Date  Taking? Authorizing Provider  acetaminophen (TYLENOL) 325 MG tablet Take 325 mg by mouth every 6 (six) hours as needed. 09/20/20   [provider]  carvedilol (COREG) 6.25 MG tablet Take 1 tablet (6.25 mg total) by mouth 2 (two) times daily. 12/21/20   Dunn, Areta Haber, PA-C  clonazePAM (KLONOPIN) 0.25 MG disintegrating tablet Take 1 tablet (0.25 mg total) by mouth 2 (two) times daily as needed (anxiety). 07/25/20   Jennye Boroughs, MD  D3-50 1.25 MG (50000 UT) capsule Take 50,000 Units by mouth  once a week. 10/21/20   [provider]  hydrALAZINE (APRESOLINE) 100 MG tablet Take 1 tablet (100 mg total) by mouth 2 (two) times daily. 09/06/20   Darylene Price A, FNP  insulin detemir (LEVEMIR) 100 unit/ml SOLN Inject 35 Units into the skin daily.    [provider]  insulin lispro (HUMALOG) 100 UNIT/ML injection Inject 5 Units into the skin 3 (three) times daily before meals.    [provider]  isosorbide mononitrate (IMDUR) 30 MG 24 hr tablet Take 30 mg by mouth daily.    [provider]  nitroGLYCERIN (NITROSTAT) 0.4 MG SL tablet Place 1 tablet (0.4 mg total) under the tongue every 5 (five) minutes as needed for chest pain. 07/25/20   Jennye Boroughs, MD  PROAIR HFA 108 [phone] Base) MCG/ACT inhaler Inhale 2 puffs into the lungs every 4 (four) hours as needed. 10/07/20   [provider]    Allergies Patient has no known allergies.  Family History  Problem Relation Age of Onset   Kidney failure Mother        died @ 61   Heart failure Mother    Other Father        he never knew his father   Diabetes Brother     Social History Social History   Tobacco Use   Smoking status: Never   Smokeless tobacco: Never  Vaping Use   Vaping Use: Never used  Substance Use Topics   Alcohol use: Never   Drug use: Never    Review of Systems  Constitutional: No fever/chills.  Positive for generalized weakness and malaise. Eyes: No visual changes. ENT: No sore throat. Cardiovascular: Denies chest pain. Respiratory: Positive for shortness of breath. Gastrointestinal: No abdominal pain.  No nausea, no vomiting.  No diarrhea.  No constipation. Genitourinary: Negative for dysuria. Musculoskeletal: Negative for back pain. Skin: Negative for rash. Neurological: Negative for headaches, focal weakness or numbness.  ____________________________________________   PHYSICAL EXAM:  VITAL SIGNS: ED Triage Vitals  Enc Vitals Group     BP 12/23/20 1652 (!)  169/91     Pulse Rate 12/23/20 1652 100     Resp 12/23/20 1652 20     Temp 12/23/20 1652 99 F (37.2 C)     Temp Source 12/23/20 1652 Oral     SpO2 12/23/20 1652 98 %     Weight 12/23/20 1654 155 lb 8 oz (70.5 kg)     Height 12/23/20 1654 '5\' 6"'$  (1.676 m)     Head Circumference --      Peak Flow --      Pain Score 12/23/20 1654 0     Pain Loc --      Pain Edu? --      Excl. in [place]? --     Constitutional: Alert and oriented. Eyes: Conjunctivae are normal. Head: Atraumatic. Nose: No congestion/rhinnorhea. Mouth/Throat: Mucous membranes are moist. Neck: Normal ROM Cardiovascular: Normal rate, regular rhythm. Grossly  normal heart sounds.  2+ radial pulses bilaterally. Respiratory: Normal respiratory effort.  No retractions. Lungs CTAB. Gastrointestinal: Soft and nontender. No distention. Genitourinary: deferred Musculoskeletal: No lower extremity tenderness, trace pitting edema bilaterally. Neurologic:  Normal speech and language. No gross focal neurologic deficits are appreciated. Skin:  Skin is warm, dry and intact. No rash noted. Psychiatric: Mood and affect are normal. Speech and behavior are normal.  ____________________________________________   LABS (all labs ordered are listed, but only abnormal results are displayed)  Labs Reviewed  BASIC METABOLIC PANEL - Abnormal; Notable for the following components:      Result Value   Potassium 5.9 (*)    CO2 19 (*)    Glucose, Bld 321 (*)    BUN 65 (*)    Creatinine, Ser 4.07 (*)    GFR, Estimated 16 (*)    All other components within normal limits  CBC - Abnormal; Notable for the following components:   RBC 2.99 (*)    Hemoglobin 8.9 (*)    HCT 27.3 (*)    All other components within normal limits  TROPONIN I (HIGH SENSITIVITY) - Abnormal; Notable for the following components:   Troponin I (High Sensitivity) 69 (*)    All other components within normal limits  SARS CORONAVIRUS 2 (TAT 6-24 HRS)  TROPONIN I (HIGH  SENSITIVITY)   ____________________________________________  EKG  ED ECG REPORT I, Blake Divine, the attending physician, personally viewed and interpreted this ECG.   Date: 12/23/2020  EKG Time: 17:02  Rate: 80  Rhythm: normal sinus rhythm  Axis: Normal  Intervals:none  ST&T Change: None   PROCEDURES  Procedure(s) performed (including Critical Care):  Procedures   ____________________________________________   INITIAL IMPRESSION / ASSESSMENT AND PLAN / ED COURSE      60 year old male with past medical history of hypertension, hyperlipidemia, diabetes, CAD, CHF, and CKD who presents to the ED for elevated potassium noted on outpatient labs by his cardiologist.  Patient appears to have had gradually worsening renal function over the past few months since he was discharged from the hospital.  He has been lost to follow-up with nephrology.  Labs today show worsening creatinine and GFR with potassium of 5.9.  No EKG changes noted.  He is not in any respiratory distress and is maintaining O2 sats on room air.  Chest x-ray reviewed by me with no infiltrate, edema, or effusion.  Findings discussed with nephrology, who recommend proceeding with dose of Veltassa, patient not yet a candidate for dialysis.  Given his significant barriers to care and worsening renal function, plan to discuss with hospitalist for admission to ensure improvement in his potassium.      ____________________________________________   FINAL CLINICAL IMPRESSION(S) / ED DIAGNOSES  Final diagnoses:  Hyperkalemia  Chronic kidney disease, unspecified CKD stage     ED Discharge Orders     None        Note:  This document was prepared using Dragon voice recognition software and may include unintentional dictation errors.    Blake Divine, MD 12/23/20 [phone]

## 2020-12-23 NOTE — H&P (Signed)
History and Physical    Jimmy Olson [id number] DOB: [birth year] DOA: 12/23/2020  PCP: [facility] [place]  Patient coming from: Home  I have personally briefly reviewed patient's old medical records in [place]  Chief Complaint: Hyperkalemia  HPI: Jimmy Olson is a Spanish-speaking 60 y.o. male with medical history significant for chronic combined systolic and diastolic CHF (EF 99991111, G2 DD), CKD stage IV, IDT2DM, CAD, HTN, HLD, anemia of chronic disease, and anxiety who presented to the ED for evaluation of hyperkalemia.  Video interpreter is used to facilitate communication.  Patient states he came to the ED because he was told his potassium levels were high when checked by his cardiology team on an outpatient basis.  He was seen on 12/21/2020 in his cardiology clinic for follow-up.  It was noted that he had not been able to take several of his medications due to financial reasons.  Labs were obtained and potassium level was noted to be elevated at 5.9.  Repeat level was checked this morning (7/7) and was 5.7.  Patient subsequently came to the ED for further evaluation.  Patient also reports recent shortness of breath, worse at night and when laying flat.  He has been waking up short of breath from sleep.  He also noted left-sided chest pain earlier today which occurred after activity such as walking up a flight of stairs.  Pain is pressure-like with some radiation towards his left arm.  He reported some associated diaphoresis and nausea without emesis.  He has not had any difficulty with urination or lower extremity swelling.  ED Course:  Initial vitals showed BP 169/91, pulse 100, RR 20, temp 99.0 F, SPO2 98% on room air.  Labs show potassium 5.9, sodium 135, bicarb 19, BUN 65, creatinine 4.07, serum glucose 321, WBC 4.5, hemoglobin 8.9, platelets 230,000.  High-sensitivity troponin 69.  2 view chest x-ray shows questionable pulmonary nodularity within the  lower lobe on lateral view without pulmonary edema or pleural effusion.  EDP discussed with on-call nephrology who recommended administering Veltassa and that dialysis not currently indicated.  They will follow in hospital.  The hospitalist service was consulted to admit for further evaluation and management.  Review of Systems: All systems reviewed and are negative except as documented in history of present illness above.   Past Medical History:  Diagnosis Date   Cardiomyopathy ([place])    a. 07/2009 MV: EF 48%; b. 08/2019 Echo: EF 45-50%. Global HK. Mod LVH. Gr1 DD. Nl RV fxn. Nl PASP.   Chest pain    a. 07/2009 MV: EF 48%. No ischemia.   Chronic kidney disease (CKD), stage IV (severe) (HCC)    Coronary artery disease    Diabetes mellitus without complication (HCC)    HFmrEF (heart failure with mildy reduced ejection fraction) ([place])    Hypertension     Past Surgical History:  Procedure Laterality Date   APPENDECTOMY     CARDIAC CATHETERIZATION     NASAL SINUS SURGERY     RIGHT/LEFT HEART CATH AND CORONARY ANGIOGRAPHY N/A 07/19/2020   Procedure: RIGHT/LEFT HEART CATH AND CORONARY ANGIOGRAPHY;  Surgeon: Wellington Hampshire, MD;  Location: [facility];  Service: Cardiovascular;  Laterality: N/A;    Social History:  reports that he has never smoked. He has never used smokeless tobacco. He reports that he does not drink alcohol and does not use drugs.  No Known Allergies  Family History  Problem Relation Age of Onset  Kidney failure Mother        died @ 35   Heart failure Mother    Other Father        he never knew his father   Diabetes Brother      Prior to Admission medications   Medication Sig Start Date End Date Taking? Authorizing Provider  acetaminophen (TYLENOL) 325 MG tablet Take 325 mg by mouth every 6 (six) hours as needed. 09/20/20   [provider]  carvedilol (COREG) 6.25 MG tablet Take 1 tablet (6.25 mg total) by mouth 2 (two) times daily. 12/21/20    Dunn, Areta Haber, PA-C  clonazePAM (KLONOPIN) 0.25 MG disintegrating tablet Take 1 tablet (0.25 mg total) by mouth 2 (two) times daily as needed (anxiety). 07/25/20   Jennye Boroughs, MD  D3-50 1.25 MG (50000 UT) capsule Take 50,000 Units by mouth once a week. 10/21/20   [provider]  hydrALAZINE (APRESOLINE) 100 MG tablet Take 1 tablet (100 mg total) by mouth 2 (two) times daily. 09/06/20   Darylene Price A, FNP  insulin detemir (LEVEMIR) 100 unit/ml SOLN Inject 35 Units into the skin daily.    [provider]  insulin lispro (HUMALOG) 100 UNIT/ML injection Inject 5 Units into the skin 3 (three) times daily before meals.    [provider]  isosorbide mononitrate (IMDUR) 30 MG 24 hr tablet Take 30 mg by mouth daily.    [provider]  nitroGLYCERIN (NITROSTAT) 0.4 MG SL tablet Place 1 tablet (0.4 mg total) under the tongue every 5 (five) minutes as needed for chest pain. 07/25/20   Jennye Boroughs, MD  PROAIR HFA 108 [phone] Base) MCG/ACT inhaler Inhale 2 puffs into the lungs every 4 (four) hours as needed. 10/07/20   [provider]    Physical Exam: Vitals:   12/23/20 1652 12/23/20 1654 12/23/20 1847 12/23/20 1900  BP: (!) 169/91  (!) 195/75 (!) 195/85  Pulse: 100  83 75  Resp: 20  18 (!) 22  Temp: 99 F (37.2 C)     TempSrc: Oral     SpO2: 98%  98% 98%  Weight:  70.5 kg    Height:  '5\' 6"'$  (1.676 m)     Constitutional: Resting supine in bed, shivering but in NAD, calm, comfortable Eyes: PERRL, lids and conjunctivae normal ENMT: Mucous membranes are moist. Posterior pharynx clear of any exudate or lesions.Normal dentition.  Neck: normal, supple, no masses. Respiratory: Inspiratory crackles left lung base otherwise clear to auscultation.  Normal respiratory effort. No accessory muscle use.  Cardiovascular: Regular rate and rhythm, no murmurs / rubs / gallops. No extremity edema. 2+ pedal pulses. Abdomen: no tenderness, no masses palpated. No  hepatosplenomegaly. Bowel sounds positive.  Musculoskeletal: no clubbing / cyanosis. No joint deformity upper and lower extremities. Good ROM, no contractures. Normal muscle tone.  Skin: no rashes, lesions, ulcers. No induration Neurologic: CN 2-12 grossly intact. Sensation intact. Strength 5/5 in all 4.  Psychiatric: Normal judgment and insight. Alert and oriented x 3. Normal mood.   Labs on Admission: I have personally reviewed following labs and imaging studies  CBC: Recent Labs  Lab 12/23/20 1657  WBC 4.5  HGB 8.9*  HCT 27.3*  MCV 91.3  PLT [id number]   Basic Metabolic Panel: Recent Labs  Lab 12/21/20 1023 12/23/20 1420 12/23/20 1657  NA 141  --  135  K 5.9* 5.7* 5.9*  CL 111*  --  109  CO2 18*  --  19*  GLUCOSE  161*  --  321*  BUN 51*  --  65*  CREATININE 3.73*  --  4.07*  CALCIUM 9.5  --  9.0   GFR: Estimated Creatinine Clearance: 17.9 mL/min (A) (by C-G formula based on SCr of 4.07 mg/dL (H)). Liver Function Tests: No results for input(s): AST, ALT, ALKPHOS, BILITOT, PROT, ALBUMIN in the last 168 hours. No results for input(s): LIPASE, AMYLASE in the last 168 hours. No results for input(s): AMMONIA in the last 168 hours. Coagulation Profile: No results for input(s): INR, PROTIME in the last 168 hours. Cardiac Enzymes: No results for input(s): CKTOTAL, CKMB, CKMBINDEX, TROPONINI in the last 168 hours. BNP (last 3 results) No results for input(s): PROBNP in the last 8760 hours. HbA1C: No results for input(s): HGBA1C in the last 72 hours. CBG: No results for input(s): GLUCAP in the last 168 hours. Lipid Profile: No results for input(s): CHOL, HDL, LDLCALC, TRIG, CHOLHDL, LDLDIRECT in the last 72 hours. Thyroid Function Tests: No results for input(s): TSH, T4TOTAL, FREET4, T3FREE, THYROIDAB in the last 72 hours. Anemia Panel: No results for input(s): VITAMINB12, FOLATE, FERRITIN, TIBC, IRON, RETICCTPCT in the last 72 hours. Urine analysis: No results found for:  COLORURINE, APPEARANCEUR, LABSPEC, [place], GLUCOSEU, HGBUR, BILIRUBINUR, KETONESUR, PROTEINUR, UROBILINOGEN, NITRITE, LEUKOCYTESUR  Radiological Exams on Admission: DG Chest 2 View  Result Date: 12/23/2020 CLINICAL DATA:  Shortness of breath. EXAM: CHEST - 2 VIEW COMPARISON:  Chest x-ray 07/20/2020, CT chest 09/10/2020 FINDINGS: The heart size and mediastinal contours are unchanged. Aortic calcification. Question pulmonary nodularity within lower lobe on lateral view. No pulmonary edema. No pleural effusion. No pneumothorax. No acute osseous abnormality. IMPRESSION: Question pulmonary nodularity within lower lobe on lateral view. Recommend CT chest for further evaluation. Electronically Signed   By: Iven Finn M.D.   On: 12/23/2020 17:33    EKG: Personally reviewed. Normal sinus rhythm without acute ischemic changes or peaked T waves.  Assessment/Plan Principal Problem:   Hyperkalemia Active Problems:   Hypertension associated with diabetes ([place])   Type II diabetes mellitus with renal manifestations ([place])   Coronary artery disease involving native coronary artery of native heart without angina pectoris   Chronic combined systolic and diastolic CHF (congestive heart failure) (HCC)   CKD (chronic kidney disease), stage IV (HCC)   Hyperlipidemia associated with type 2 diabetes mellitus ([place])   Jimmy Olson is a Spanish-speaking 60 y.o. male with medical history significant for chronic combined systolic and diastolic CHF (EF 99991111, G2 DD), CKD stage IV, IDT2DM, CAD, HTN, HLD, anemia of chronic disease, and anxiety who is admitted with hyperkalemia.  Hyperkalemia: Potassium 5.9 on admission in the setting of CKD stage IV.  No EKG changes present.  He has been given Veltassa.  Repeat labs in AM.  CKD stage IV: Renal function relatively stable compared to recent baseline however has had progressively worsening renal function over the last year.  EDP discussed with nephrology, currently no  emergent indication for dialysis.  Chronic combined systolic and diastolic CHF: Appears euvolemic however does report recent orthopnea and PND.  Currently saturating well on room air.  CXR with nonspecific nodularity lower lobes.  CT chest ordered and pending.  Checking BNP. -Continue Coreg 6.25 mg twice daily, can increase if needed -Not on ACE/ARB/spironolactone due to CKD stage IV -Strict I/O's, daily weights  CAD: Describes recent exertional anginal type symptoms.  Troponin mildly elevated at 69 and flat on repeat, lower than prior levels.  EKG without any acute ischemic changes.  Underwent right/left heart cath 07/19/2020 which showed moderate two-vessel CAD involving LAD and RCA. -Start aspirin 81 mg daily, atorvastatin 40 mg daily -Continue Coreg, Imdur, nitroglycerin if needed  Insulin-dependent type 2 diabetes with hyperglycemia: Hyperglycemic on arrival.  Continue home Levemir 35 units, add SSI with HS coverage.  Hypertension: Hypertensive on admission likely due to difficulty obtaining medications.  Resume Coreg, hydralazine, Imdur, amlodipine.  TOC consult for medication assistance.  Pulmonary nodularity seen on chest x-ray: Follow-up CT chest recommended by radiology which is ordered.  Anemia of chronic kidney disease: Stable without evidence of active bleeding.  Hyperlipidemia: Start atorvastatin 40 mg daily.  DVT prophylaxis: Subcutaneous heparin Code Status: Full code, confirmed with patient Family Communication: Discussed with patient, he has discussed with family Disposition Plan: From home and likely discharge to home pending clinical progress Consults called: EDP discussed with on-call nephrology Level of care: Med-Surg Admission status:   Status is: Observation  The patient remains OBS appropriate and will d/c before 2 midnights.  Dispo: The patient is from: Home              Anticipated d/c is to: Home              Patient currently is not medically  stable to d/c.   Difficult to place patient No  Zada Finders MD [facility]  If 7PM-7AM, please contact night-coverage [email or web address]  12/23/2020, 7:25 PM

## 2020-12-23 NOTE — Telephone Encounter (Signed)
Update received from clinical staff.  Patient has not followed through with contacting medical management as previously directed multiple times.  He was again advised to contact medication management to see if he qualifies for medication assistance.

## 2020-12-24 ENCOUNTER — Other Ambulatory Visit: Payer: Self-pay

## 2020-12-24 DIAGNOSIS — E875 Hyperkalemia: Principal | ICD-10-CM

## 2020-12-24 LAB — BASIC METABOLIC PANEL
Anion gap: 7 (ref 5–15)
BUN: 60 mg/dL — ABNORMAL HIGH (ref 6–20)
CO2: 20 mmol/L — ABNORMAL LOW (ref 22–32)
Calcium: 9.1 mg/dL (ref 8.9–10.3)
Chloride: 111 mmol/L (ref 98–111)
Creatinine, Ser: 3.85 mg/dL — ABNORMAL HIGH (ref 0.61–1.24)
GFR, Estimated: 17 mL/min — ABNORMAL LOW (ref >60)
Glucose, Bld: 70 mg/dL (ref 70–99)
Potassium: 4.9 mmol/L (ref 3.5–5.1)
Sodium: 138 mmol/L (ref 135–145)

## 2020-12-24 LAB — CBC
HCT: 28.9 % — ABNORMAL LOW (ref 39.0–52.0)
Hemoglobin: 9.8 g/dL — ABNORMAL LOW (ref 13.0–17.0)
MCH: 29.3 pg (ref 26.0–34.0)
MCHC: 33.9 g/dL (ref 30.0–36.0)
MCV: 86.5 fL (ref 80.0–100.0)
Platelets: 248 10*3/uL (ref 150–400)
RBC: 3.34 MIL/uL — ABNORMAL LOW (ref 4.22–5.81)
RDW: 12 % (ref 11.5–15.5)
WBC: 5.1 10*3/uL (ref 4.0–10.5)
nRBC: 0 % (ref 0.0–0.2)

## 2020-12-24 LAB — HIV ANTIBODY (ROUTINE TESTING W REFLEX): HIV Screen 4th Generation wRfx: NONREACTIVE

## 2020-12-24 LAB — MAGNESIUM: Magnesium: 1.9 mg/dL (ref 1.7–2.4)

## 2020-12-24 LAB — GLUCOSE, CAPILLARY
Glucose-Capillary: 73 mg/dL (ref 70–99)
Glucose-Capillary: 170 mg/dL — ABNORMAL HIGH (ref 70–99)

## 2020-12-24 LAB — SARS CORONAVIRUS 2 (TAT 6-24 HRS): SARS Coronavirus 2: NEGATIVE

## 2020-12-24 MED ORDER — HYDRALAZINE HCL 100 MG PO TABS: 100.0000 mg | ORAL_TABLET | Freq: Three times a day (TID) | ORAL | 6 refills | Status: DC

## 2020-12-24 MED ORDER — TORSEMIDE 20 MG PO TABS: 20.0000 mg | ORAL_TABLET | Freq: Every day | ORAL | Status: DC | PRN

## 2020-12-24 MED ORDER — AMLODIPINE BESYLATE 10 MG PO TABS
10.0000 mg | ORAL_TABLET | Freq: Every day | ORAL | 0 refills | Status: DC
  Filled 2020-12-24: qty 30, 30d supply, fill #0

## 2020-12-24 NOTE — Discharge Summary (Signed)
Physician Discharge Summary   LYDON WENZINGER  male DOB: [birth year]  [id number]  PCP: [facility] [place] date: 12/23/2020 Discharge date: 12/24/2020 Admitted From: home Disposition:  home CODE STATUS: Full code  Discharge Instructions     Discharge instructions   Complete by: As directed    Your potassium level was high due to your worsening kidney function.  Please avoid eating too much foods that are high in potassium.  Dr. Enzo Bi [employer] Course:  For full details, please see H&P, progress notes, consult notes and ancillary notes.  Briefly,  Jimmy Olson is a Spanish-speaking 60 y.o. male with medical history significant for chronic combined systolic and diastolic CHF (EF [id number], G2 DD), CKD stage IV, IDT2DM, CAD, HTN, HLD, anemia of chronic disease, and anxiety who presented to the ED for evaluation of hyperkalemia.     He was seen on 12/21/2020 in his cardiology clinic for follow-up.  It was noted that he had not been able to take several of his medications due to financial reasons.  Labs were obtained and potassium level was noted to be elevated at 5.9.  Repeat level was checked this morning (7/7) and was 5.7.  Patient subsequently came to the ED for further evaluation.  Hyperkalemia, resolved Potassium 5.9 on admission in the setting of CKD stage IV.  No EKG changes present.  He received [place], and K+ was 4.9 next day before discharge. I requested information on dialysis diet from nephrology NP, which was delivered to pt, so help him eat a diet low in potassium.   CKD stage IV: Renal function relatively stable compared to recent baseline however has had progressively worsening renal function over the last year.  EDP discussed with nephrology, currently no emergent indication for dialysis.  Pt said he couldn't establish with outpatient nephrology due to no insurance.     Chronic combined systolic and diastolic CHF: Appears  euvolemic and saturating well on room air.   -Continue Coreg 6.25 mg twice daily. -Not on ACE/ARB/spironolactone due to CKD stage IV  CAD: Described recent exertional anginal type symptoms.  Troponin mildly elevated at 69 and flat on repeat, lower than prior levels.  EKG without any acute ischemic changes.  Underwent right/left heart cath 07/19/2020 which showed moderate two-vessel CAD involving LAD and RCA. -Continue Coreg, Imdur   Insulin-dependent type 2 diabetes with hyperglycemia: Hyperglycemic on arrival.   Continue home insulin regimen as below.   Hypertension: --cont Coreg, hydralazine, Imdur, amlodipine.  TOC consulted for medication assistance.   Pulmonary nodularity seen on chest x-ray: Follow-up CT chest recommended by radiology which is ordered.   Anemia of chronic kidney disease: Stable without evidence of active bleeding.    Discharge Diagnoses:  Principal Problem:   Hyperkalemia Active Problems:   Hypertension associated with diabetes ([place])   Type II diabetes mellitus with renal manifestations ([place])   Coronary artery disease involving native coronary artery of native heart without angina pectoris   Chronic combined systolic and diastolic CHF (congestive heart failure) (HCC)   CKD (chronic kidney disease), stage IV ([place])   Hyperlipidemia associated with type 2 diabetes mellitus ([place])     Discharge Instructions:  Allergies as of 12/24/2020   No Known Allergies      Medication List     STOP taking these medications    clonazePAM 0.25 MG disintegrating tablet Commonly known as: KLONOPIN   lisinopril 10 MG tablet  Commonly known as: ZESTRIL       TAKE these medications    acetaminophen 325 MG tablet Commonly known as: TYLENOL Take 325 mg by mouth every 6 (six) hours as needed.   amLODipine 10 MG tablet Commonly known as: NORVASC Take 1 tablet (10 mg total) by mouth daily. What changed:  medication strength how much to take   carvedilol 6.25  MG tablet Commonly known as: COREG Take 1 tablet (6.25 mg total) by mouth 2 (two) times daily.   cetirizine 10 MG tablet Commonly known as: ZYRTEC Take 10 mg by mouth daily as needed for allergies.   D3-50 1.25 MG (50000 UT) capsule Generic drug: Cholecalciferol Take 50,000 Units by mouth once a week.   FLUoxetine 10 MG capsule Commonly known as: PROZAC Take 10 mg by mouth 2 (two) times daily.   GNP Aspirin 81 MG EC tablet Generic drug: aspirin Take 81 mg by mouth daily.   hydrALAZINE 100 MG tablet Commonly known as: APRESOLINE Take 1 tablet (100 mg total) by mouth 3 (three) times daily. What changed: when to take this   insulin detemir 100 unit/ml Soln Commonly known as: LEVEMIR Inject 35 Units into the skin daily.   insulin lispro 100 UNIT/ML injection Commonly known as: HUMALOG Inject 5 Units into the skin 3 (three) times daily before meals.   isosorbide mononitrate 30 MG 24 hr tablet Commonly known as: IMDUR Take 30 mg by mouth daily.   nitroGLYCERIN 0.4 MG SL tablet Commonly known as: NITROSTAT Place 1 tablet (0.4 mg total) under the tongue every 5 (five) minutes as needed for chest pain.   ProAir HFA 108 (90 Base) MCG/ACT inhaler Generic drug: albuterol Inhale 2 puffs into the lungs every 4 (four) hours as needed.   torsemide 20 MG tablet Commonly known as: DEMADEX Take 1 tablet (20 mg total) by mouth daily as needed. What changed:  when to take this reasons to take this         Follow-up Information     Gollan, Kathlene November, MD .   Specialty: Cardiology Contact information: [place] [address] [phone]                 No Known Allergies   The results of significant diagnostics from this hospitalization (including imaging, microbiology, ancillary and laboratory) are listed below for reference.   Consultations:   Procedures/Studies: DG Chest 2 View  Result Date: 12/23/2020 CLINICAL DATA:   Shortness of breath. EXAM: CHEST - 2 VIEW COMPARISON:  Chest x-ray 07/20/2020, CT chest 09/10/2020 FINDINGS: The heart size and mediastinal contours are unchanged. Aortic calcification. Question pulmonary nodularity within lower lobe on lateral view. No pulmonary edema. No pleural effusion. No pneumothorax. No acute osseous abnormality. IMPRESSION: Question pulmonary nodularity within lower lobe on lateral view. Recommend CT chest for further evaluation. Electronically Signed   By: Iven Finn M.D.   On: 12/23/2020 17:33   CT CHEST WO CONTRAST  Result Date: 12/23/2020 CLINICAL DATA:  Elevated potassium level. EXAM: CT CHEST WITHOUT CONTRAST TECHNIQUE: Multidetector CT imaging of the chest was performed following the standard protocol without IV contrast. COMPARISON:  09/11/2019.  Chest radiograph 12/23/2020 FINDINGS: Cardiovascular: Cardiac enlargement. No pericardial effusions. Coronary artery and mild aortic calcification. Mediastinum/Nodes: No enlarged mediastinal or axillary lymph nodes. Thyroid gland, trachea, and esophagus demonstrate no significant findings. Lungs/Pleura: Lungs are clear. No pleural effusion or pneumothorax. Upper Abdomen: No acute abnormality. Musculoskeletal: Degenerative changes in the spine. No  destructive bone lesions. IMPRESSION: Cardiac enlargement. Mild aortic and coronary artery atherosclerosis. Lungs are clear. Electronically Signed   By: Lucienne Capers M.D.   On: 12/23/2020 20:37      Labs: BNP (last 3 results) Recent Labs    07/18/20 0552 07/20/20 0443 12/23/20 1657  BNP 418.7* 584.1* [id number]*   Basic Metabolic Panel: Recent Labs  Lab 12/21/20 1023 12/23/20 1420 12/23/20 1657 12/24/20 0429  NA 141  --  135 138  K 5.9* 5.7* 5.9* 4.9  CL 111*  --  109 111  CO2 18*  --  19* 20*  GLUCOSE 161*  --  321* 70  BUN 51*  --  65* 60*  CREATININE 3.73*  --  4.07* 3.85*  CALCIUM 9.5  --  9.0 9.1  MG  --   --   --  1.9   Liver Function Tests: No results for  input(s): AST, ALT, ALKPHOS, BILITOT, PROT, ALBUMIN in the last 168 hours. No results for input(s): LIPASE, AMYLASE in the last 168 hours. No results for input(s): AMMONIA in the last 168 hours. CBC: Recent Labs  Lab 12/23/20 1657 12/24/20 0429  WBC 4.5 5.1  HGB 8.9* 9.8*  HCT 27.3* 28.9*  MCV 91.3 86.5  PLT 230 248   Cardiac Enzymes: No results for input(s): CKTOTAL, CKMB, CKMBINDEX, TROPONINI in the last 168 hours. BNP: Invalid input(s): POCBNP CBG: Recent Labs  Lab 12/23/20 2202 12/23/20 2321 12/24/20 0735 12/24/20 1149  GLUCAP 150* 128* 73 170*   D-Dimer No results for input(s): DDIMER in the last 72 hours. Hgb A1c No results for input(s): HGBA1C in the last 72 hours. Lipid Profile No results for input(s): CHOL, HDL, LDLCALC, TRIG, CHOLHDL, LDLDIRECT in the last 72 hours. Thyroid function studies No results for input(s): TSH, T4TOTAL, T3FREE, THYROIDAB in the last 72 hours.  Invalid input(s): FREET3 Anemia work up No results for input(s): VITAMINB12, FOLATE, FERRITIN, TIBC, IRON, RETICCTPCT in the last 72 hours. Urinalysis No results found for: COLORURINE, APPEARANCEUR, [place], [place], GLUCOSEU, [place], [place], [place], PROTEINUR, UROBILINOGEN, NITRITE, LEUKOCYTESUR Sepsis Labs Invalid input(s): PROCALCITONIN,  WBC,  LACTICIDVEN Microbiology Recent Results (from the past 240 hour(s))  SARS CORONAVIRUS 2 (TAT 6-24 HRS) Nasopharyngeal Nasopharyngeal Swab     Status: None   Collection Time: 12/23/20  7:09 PM   Specimen: Nasopharyngeal Swab  Result Value Ref Range Status   SARS Coronavirus 2 NEGATIVE NEGATIVE Final    Comment: (NOTE) SARS-CoV-2 target nucleic acids are NOT DETECTED.  The SARS-CoV-2 RNA is generally detectable in upper and lower respiratory specimens during the acute phase of infection. Negative results do not preclude SARS-CoV-2 infection, do not rule out co-infections with other pathogens, and should not be used as the sole basis for  treatment or other patient management decisions. Negative results must be combined with clinical observations, patient history, and epidemiological information. The expected result is Negative.  Fact Sheet for Patients: SugarRoll.be  Fact Sheet for Healthcare Providers: https://www.woods-mathews.com/  This test is not yet approved or cleared by the [place] FDA and  has been authorized for detection and/or diagnosis of SARS-CoV-2 by FDA under an Emergency Use Authorization (EUA). This EUA will remain  in effect (meaning this test can be used) for the duration of the COVID-19 declaration under Se ction 564(b)(1) of the Act, 21 U.S.C. section 360bbb-3(b)(1), unless the authorization is terminated or revoked sooner.  Performed at [facility], [place] [address], [place], [place] [address]      Total  time spend on discharging this patient, including the last patient exam, discussing the hospital stay, instructions for ongoing care as it relates to all pertinent caregivers, as well as preparing the medical discharge records, prescriptions, and/or referrals as applicable, is 50 minutes.    Enzo Bi, MD  [facility] 12/24/2020, 1:54 PM

## 2020-12-24 NOTE — Progress Notes (Signed)
Patient discharged home via wheelchair to his car. Patient discharged with all pertinent information, prescriptions, medications, and personal belongings.  Patient able to teach back instructions.  IV site d/ced, gauze placed. No acute distress noted. Care relinquished.

## 2020-12-24 NOTE — TOC Initial Note (Signed)
Transition of Care [facility]) - Initial/Assessment Note    Patient Details  Name: Jimmy Olson MRN: [id number] Date of Birth: 1961/05/13  [facility] CM/SW Contact:    Magnus Ivan, LCSW Phone Number: 12/24/2020, 2:32 PM  Clinical Narrative:             CSW and Interpreter Jacqui met with patient at bedside. Patient to discharge home today. Provided low K diet info (in [place]) provided by NP. Provided and explained [facility] and [facility] application (Spanish version). Sent referral to [place] at [facility] via [facility] [place] as well. CSW arranged for [employer] to pick up medications from [facility] and deliver to [facility] prior to discharge.  Patient verbalized understanding and stated his wife will pick him up when discharged.    Expected Discharge Plan: Home/Self Care Barriers to Discharge: Barriers Resolved   Patient Goals and CMS Choice Patient states their goals for this hospitalization and ongoing recovery are:: to return home CMS Medicare.gov Compare Post Acute Care list provided to:: Patient Choice offered to / list presented to : Patient  Expected Discharge Plan and Services Expected Discharge Plan: Home/Self Care       Living arrangements for the past 2 months: Single Family Home Expected Discharge Date: 12/24/20                                    Prior Living Arrangements/Services Living arrangements for the past 2 months: Single Family Home Lives with:: Spouse Patient language and need for interpreter reviewed:: Yes Do you feel safe going back to the place where you live?: Yes      Need for Family Participation in Patient Care: Yes (Comment) Care giver support system in place?: Yes (comment)   Criminal Activity/Legal Involvement Pertinent to Current Situation/Hospitalization: No - Comment as needed  Activities of Daily Living Home Assistive Devices/Equipment: None ADL Screening (condition at time of  admission) Patient's cognitive ability adequate to safely complete daily activities?: Yes Is the patient deaf or have difficulty hearing?: No Does the patient have difficulty seeing, even when wearing glasses/contacts?: No Does the patient have difficulty concentrating, remembering, or making decisions?: No Patient able to express need for assistance with ADLs?: Yes Does the patient have difficulty dressing or bathing?: No Independently performs ADLs?: Yes (appropriate for developmental age) Does the patient have difficulty walking or climbing stairs?: No Weakness of Legs: None Weakness of Arms/Hands: None  Permission Sought/Granted Permission sought to share information with : Chartered certified accountant granted to share information with : Yes, Verbal Permission Granted     Permission granted to share info w AGENCY: [facility], [facility]        Emotional Assessment       Orientation: : Oriented to Self, Oriented to Place, Oriented to  Time, Oriented to Situation Alcohol / Substance Use: Not Applicable Psych Involvement: No (comment)  Admission diagnosis:  Hyperkalemia [E87.5] Chronic kidney disease, unspecified CKD stage [N18.9] Patient Active Problem List   Diagnosis Date Noted   Hyperkalemia 12/23/2020   Chronic combined systolic and diastolic CHF (congestive heart failure) ([place]) 12/23/2020   CKD (chronic kidney disease), stage IV ([place]) 12/23/2020   Hyperlipidemia associated with type 2 diabetes mellitus ([place]) 12/23/2020   Coronary artery disease involving native coronary artery of native heart without angina pectoris    Demand ischemia ([place])    ATN (acute tubular  necrosis) ([place])    Severe recurrent major depression without psychotic features ([place]) 07/16/2020   Elevated troponin 07/15/2020   Type II diabetes mellitus with renal manifestations ([place]) 07/15/2020   Acute on chronic HFrEF (heart failure with reduced ejection fraction) ([place])  07/15/2020   Acute renal failure superimposed on stage 4 chronic kidney disease ([place]) 07/15/2020   HLD (hyperlipidemia) 07/15/2020   HFrEF (heart failure with reduced ejection fraction) ([place])    Hypertension associated with diabetes ([place])    Acute on chronic respiratory failure with hypoxia ([place]) 09/13/2019   CAP (community acquired pneumonia) 09/11/2019   Acute respiratory failure with hypercapnia ([place]) 09/11/2019   Accelerated hypertension 09/11/2019   Type 2 diabetes mellitus with hyperlipidemia ([place]) 09/11/2019   Type 2 diabetes mellitus with other specified complication ([place]) 41/36/4383   AKI (acute kidney injury) ([place]) 09/11/2019   PCP:  [facility] [place]:   [place], [name] [address] Phone: [phone] Fax: [phone]  [facility] [address] [name] [address] Phone: [phone] Fax: [phone]  [place] [address] [place], [name] [address] Phone: [phone] Fax: [phone]     Social Determinants of Health (SDOH) Interventions    Readmission Risk Interventions No flowsheet data found.

## 2020-12-28 LAB — HEMOGLOBIN A1C
Hgb A1c MFr Bld: 10.3 % — ABNORMAL HIGH (ref 4.8–5.6)
Mean Plasma Glucose: 248.91 mg/dL

## 2021-01-20 ENCOUNTER — Telehealth: Payer: Self-pay | Admitting: Gerontology

## 2021-01-20 NOTE — Telephone Encounter (Addendum)
Pt was made aware of eligibility and is wanting to come pick up an application.. PT was given directions to [facility]  ----- Message from Gean Maidens sent at 01/20/2021  9:52 AM EDT ----- Please call patient ----- Message ----- From: Magnus Ivan, LCSW Sent: 12/24/2020   2:37 PM EDT To: Gean Maidens  [facility] referral. Will need interpreter services (Spanish). Thanks!

## 2021-01-21 ENCOUNTER — Encounter: Payer: Self-pay | Admitting: Cardiovascular Disease

## 2021-01-21 ENCOUNTER — Other Ambulatory Visit: Payer: Self-pay

## 2021-01-21 ENCOUNTER — Ambulatory Visit (INDEPENDENT_AMBULATORY_CARE_PROVIDER_SITE_OTHER): Payer: Self-pay | Admitting: Cardiovascular Disease

## 2021-01-21 VITALS — BP 180/60 | HR 78 | Ht 66.0 in | Wt 156.5 lb

## 2021-01-21 DIAGNOSIS — I110 Hypertensive heart disease with heart failure: Secondary | ICD-10-CM

## 2021-01-21 DIAGNOSIS — I25118 Atherosclerotic heart disease of native coronary artery with other forms of angina pectoris: Secondary | ICD-10-CM

## 2021-01-21 DIAGNOSIS — I5042 Chronic combined systolic (congestive) and diastolic (congestive) heart failure: Secondary | ICD-10-CM

## 2021-01-21 DIAGNOSIS — I10 Essential (primary) hypertension: Secondary | ICD-10-CM

## 2021-01-21 MED ORDER — ISOSORBIDE MONONITRATE ER 30 MG PO TB24
30.0000 mg | ORAL_TABLET | Freq: Two times a day (BID) | ORAL | 3 refills | Status: DC
Start: 2021-01-21 — End: 2021-06-07

## 2021-01-21 MED ORDER — TORSEMIDE 20 MG PO TABS
20.0000 mg | ORAL_TABLET | Freq: Every day | ORAL | 3 refills | Status: DC | PRN
Start: 2021-01-21 — End: 2021-06-07

## 2021-01-21 MED ORDER — HYDRALAZINE HCL 100 MG PO TABS: 100.0000 mg | ORAL_TABLET | Freq: Two times a day (BID) | ORAL | 3 refills | Status: DC

## 2021-01-21 MED ORDER — AMLODIPINE BESYLATE 10 MG PO TABS: 10.0000 mg | ORAL_TABLET | Freq: Every evening | ORAL | 3 refills | Status: DC

## 2021-01-21 MED ORDER — CARVEDILOL 6.25 MG PO TABS
6.2500 mg | ORAL_TABLET | Freq: Two times a day (BID) | ORAL | 3 refills | Status: DC
Start: 2021-01-21 — End: 2021-06-07

## 2021-01-21 NOTE — Patient Instructions (Addendum)
Fill out paperwork for medical management  Use good [employer] [email or web address] until medical management starts  Medication Instructions:  No changes  Morning Evening  Coreg 6.25 mg Amlodipine 10 mg  Hydralazine 100 mg Coreg 6.25 mg  Imdur 30 mg Hydralazine 100 mg  Torsemide 20 mg Imdur 30 mg    If you need a refill on your cardiac medications before your next appointment, please call your pharmacy.   Lab work: No new labs needed  Testing/Procedures: No new testing needed   Follow-Up: At [facility], you and your health needs are our priority.  As part of our continuing mission to provide you with exceptional heart care, we have created designated Provider Care Teams.  These Care Teams include your primary Cardiologist (physician) and Advanced Practice Providers (APPs -  Physician Assistants and Nurse Practitioners) who all work together to provide you with the care you need, when you need it.  You will need a follow up appointment in 6 months  Providers on your designated Care Team:   Murray Hodgkins, NP Christell Faith, PA-C Marrianne Mood, PA-C Cadence [place], [place]   COVID-19 Vaccine Information can be found at: [email or web address] For questions related to vaccine distribution or appointments, please email [email or web address] or call [phone].

## 2021-01-21 NOTE — Progress Notes (Signed)
Cardiology Office Note  Date:  01/21/2021   ID:  Jimmy, Olson 1960-11-22, MRN [id number]  PCP:  [facility] [place]   Chief Complaint  Patient presents with   1 month follow up     Patient c/o chest tightness & shortness of breath on exertion.  Medications reviewed by the patient verbally.     HPI:  Jimmy Olson is a 60 y.o. male with history of  nonobstructive CAD by LHC in 06/2020,  chronic combined systolic and diastolic CHF,  NICM,  CKD stage IV,  Covid infection in 02/2020 status post monoclonal antibody infusion,  DM2,  HTN,  HLD,  anemia, and anxiety  who presents for follow up of his cardiomyopathy.   Presents with translator today Did not take medication today, makes him tired, does not like to take medications when he has to drive Took meds yesterday He has 2 different amlodipine prescriptions 1 for 2.51 for 10 which was given to him on recent discharge from the hospital where he was seen for high potassium Reports he is missing many refills on his medications He has not signed up with medical management but he does have the forms Does not want to go back to [name] as he owes them $150 for prior medications, he does not have that money at this time Currently not working, interested in applying for disability  EKG personally reviewed by myself on todays visit Normal sinus rhythm nonspecific ST-T wave abnormality  Recent cardiac history reviewed  Prior stress test in 2011 for chest pain was negative for significant ischemia.   hospital in 08/2019 with exertional chest pain with high-sensitivity troponin peaking at 199.  Echo showed an EF of 45 to 50%, global hypokinesis, moderate LVH, and grade 1 diastolic dysfunction.    admitted in 06/2020 with acute on chronic combined systolic and diastolic CHF complicated by acute on CKD stage IV.   Echo on 07/16/2020 showed an EF of 30 to 35%, global hypokinesis, mildly dilated LV cavity size, mild LVH,  grade 2 diastolic dysfunction, normal RV systolic function and ventricular cavity size, mildly dilated left atrium, and mild mitral valve regurgitation.    R/LHC on 07/19/2020 showed moderately calcified coronary arteries with moderate two-vessel CAD involving the LAD and RCA  RHC showed mildly elevated filling pressures, mild pulmonary hypertension, and normal cardiac output.    acute on CKD stage IV with consideration for hemodialysis  evaluated by psychiatry with anxiety, depression, and suicidal ideation.     office in late 07/2020 continue to eat out at a [place] buffet once per week.  Bisoprolol was titrated to 20 mg.    transitioned to carvedilol 6.25 mg.   LOV,  did not start the above-mentioned carvedilol.    not been able to take several of his medications due to financial reasons.  Labs were obtained and potassium level was noted to be elevated at 5.9.  Repeat level was checked this morning (7/7) and was 5.7.  received Veltassa, and K+ was 4.9 next day before discharge.  he couldn't establish with outpatient nephrology due to no insurance.    Blood pressures at home: 160 to 170,before medications After meds: [id number] systolic Am pills at 9 Am , pm pills at 9 pm    PMH:   has a past medical history of Cardiomyopathy ([name]), Chest pain, Chronic kidney disease (CKD), stage IV (severe) ([name]), Coronary artery disease, Diabetes mellitus without complication ([name]), HFmrEF (heart failure with mildy  reduced ejection fraction) ([name]), and Hypertension.  PSH:    Past Surgical History:  Procedure Laterality Date   APPENDECTOMY     CARDIAC CATHETERIZATION     NASAL SINUS SURGERY     RIGHT/LEFT HEART CATH AND CORONARY ANGIOGRAPHY N/A 07/19/2020   Procedure: RIGHT/LEFT HEART CATH AND CORONARY ANGIOGRAPHY;  Surgeon: Wellington Hampshire, MD;  Location: [name];  Service: Cardiovascular;  Laterality: N/A;    Current Outpatient Medications  Medication Sig Dispense Refill    acetaminophen (TYLENOL) 325 MG tablet Take 325 mg by mouth every 6 (six) hours as needed.     cetirizine (ZYRTEC) 10 MG tablet Take 10 mg by mouth daily as needed for allergies.     D3-50 1.25 MG (50000 UT) capsule Take 50,000 Units by mouth once a week.     FLUoxetine (PROZAC) 10 MG capsule Take 10 mg by mouth 2 (two) times daily.     GNP ASPIRIN 81 MG EC tablet Take 81 mg by mouth daily.     insulin detemir (LEVEMIR) 100 unit/ml SOLN Inject 35 Units into the skin daily.     insulin lispro (HUMALOG) 100 UNIT/ML injection Inject 5 Units into the skin 3 (three) times daily before meals.     nitroGLYCERIN (NITROSTAT) 0.4 MG SL tablet Place 1 tablet (0.4 mg total) under the tongue every 5 (five) minutes as needed for chest pain. 30 tablet 0   PROAIR HFA 108 (90 Base) MCG/ACT inhaler Inhale 2 puffs into the lungs every 4 (four) hours as needed.     amLODipine (NORVASC) 10 MG tablet Take 1 tablet (10 mg total) by mouth every evening. 90 tablet 3   carvedilol (COREG) 6.25 MG tablet Take 1 tablet (6.25 mg total) by mouth 2 (two) times daily. 180 tablet 3   hydrALAZINE (APRESOLINE) 100 MG tablet Take 1 tablet (100 mg total) by mouth 2 (two) times daily. May take extra as needed for blood pressure >160 200 tablet 3   isosorbide mononitrate (IMDUR) 30 MG 24 hr tablet Take 1 tablet (30 mg total) by mouth in the morning and at bedtime. 180 tablet 3   torsemide (DEMADEX) 20 MG tablet Take 1 tablet (20 mg total) by mouth daily as needed. For swelling, weight gain, or shortness of breath 90 tablet 3   No current facility-administered medications for this visit.     Allergies:   Patient has no known allergies.   Social History:  The patient  reports that he has never smoked. He has never used smokeless tobacco. He reports that he does not drink alcohol and does not use drugs.   Family History:   family history includes Diabetes in his brother; Heart failure in his mother; Kidney failure in his mother; Other  in his father.    Review of Systems: Review of Systems  Constitutional: Negative.   HENT: Negative.    Respiratory: Negative.    Cardiovascular: Negative.   Gastrointestinal: Negative.   Musculoskeletal: Negative.   Neurological: Negative.   Psychiatric/Behavioral: Negative.    All other systems reviewed and are negative.   PHYSICAL EXAM: VS:  BP (!) 180/60 (BP Location: Left Arm, Patient Position: Sitting, Cuff Size: Normal)   Pulse 78   Ht '5\' 6"'$  (1.676 m)   Wt 156 lb 8 oz (71 kg)   SpO2 98%   BMI 25.26 kg/m  , BMI Body mass index is 25.26 kg/m. GEN: Well nourished, well developed, in no acute distress HEENT: normal Neck: no  JVD, carotid bruits, or masses Cardiac: RRR; no murmurs, rubs, or gallops,no edema  Respiratory:  clear to auscultation bilaterally, normal work of breathing GI: soft, nontender, nondistended, + BS MS: no deformity or atrophy Skin: warm and dry, no rash Neuro:  Strength and sensation are intact Psych: euthymic mood, full affect   Recent Labs: 07/15/2020: ALT 15 12/23/2020: B Natriuretic Peptide 121.0 12/24/2020: BUN 60; Creatinine, Ser 3.85; Hemoglobin 9.8; Magnesium 1.9; Platelets 248; Potassium 4.9; Sodium 138    Lipid Panel Lab Results  Component Value Date   CHOL 186 07/16/2020   HDL 40 (L) 07/16/2020   LDLCALC 110 (H) 07/16/2020   TRIG 178 (H) 07/16/2020      Wt Readings from Last 3 Encounters:  01/21/21 156 lb 8 oz (71 kg)  12/23/20 155 lb 8 oz (70.5 kg)  12/21/20 155 lb 8 oz (70.5 kg)       ASSESSMENT AND PLAN:  Problem List Items Addressed This Visit       Cardiology Problems   Chronic combined systolic and diastolic CHF (congestive heart failure) (HCC)   Relevant Medications   amLODipine (NORVASC) 10 MG tablet   carvedilol (COREG) 6.25 MG tablet   hydrALAZINE (APRESOLINE) 100 MG tablet   isosorbide mononitrate (IMDUR) 30 MG 24 hr tablet   torsemide (DEMADEX) 20 MG tablet   Accelerated hypertension   Relevant  Medications   amLODipine (NORVASC) 10 MG tablet   carvedilol (COREG) 6.25 MG tablet   hydrALAZINE (APRESOLINE) 100 MG tablet   isosorbide mononitrate (IMDUR) 30 MG 24 hr tablet   torsemide (DEMADEX) 20 MG tablet   Other Relevant Orders   EKG 12-Lead   Other Visit Diagnoses     Hypertensive heart disease with heart failure (HCC)    -  Primary   Relevant Medications   amLODipine (NORVASC) 10 MG tablet   carvedilol (COREG) 6.25 MG tablet   hydrALAZINE (APRESOLINE) 100 MG tablet   isosorbide mononitrate (IMDUR) 30 MG 24 hr tablet   torsemide (DEMADEX) 20 MG tablet   Coronary artery disease of native artery of native heart with stable angina pectoris (HCC)       Relevant Medications   amLODipine (NORVASC) 10 MG tablet   carvedilol (COREG) 6.25 MG tablet   hydrALAZINE (APRESOLINE) 100 MG tablet   isosorbide mononitrate (IMDUR) 30 MG 24 hr tablet   torsemide (DEMADEX) 20 MG tablet      Most of his visit today spent discussing his medications He has the forms for medical management but has not filled them out Translator has provided him with information on where to go for assistance filling them out, a Spanish-speaking facility Medications refilled He did not take his medications this morning, reports they make him feel tired and he does not like to drive after he has taken his medications Isosorbide increased up to 30 twice daily, that is where he reports he was already taking We Threw away some of his empty bottles to avoid redundancy Amlodipine will stay at 10 mg Recommended follow-up with [name]  Coronary artery disease with stable angina Ideally should be on a statin Will discuss with him in follow-up to avoid more medication confusion This could be started by [name]  Poorly controlled diabetes type 2 with complications Unable to afford his medications, recommended follow-up with [name] for further management A1c 10.3    Total  encounter time more than 25 minutes  Greater than 50% was spent in counseling and coordination  of care with the patient    Signed, Esmond Plants, M.D., Ph.D. [place], [place]
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
## 2021-02-28 ENCOUNTER — Ambulatory Visit: Payer: Self-pay | Attending: Family | Admitting: Family

## 2021-02-28 ENCOUNTER — Other Ambulatory Visit: Payer: Self-pay

## 2021-02-28 ENCOUNTER — Encounter: Payer: Self-pay | Admitting: Family

## 2021-02-28 VITALS — BP 147/75 | HR 82 | Resp 18 | Ht 66.0 in | Wt 156.0 lb

## 2021-02-28 DIAGNOSIS — I10 Essential (primary) hypertension: Secondary | ICD-10-CM

## 2021-02-28 DIAGNOSIS — I5042 Chronic combined systolic (congestive) and diastolic (congestive) heart failure: Secondary | ICD-10-CM

## 2021-02-28 DIAGNOSIS — I130 Hypertensive heart and chronic kidney disease with heart failure and stage 1 through stage 4 chronic kidney disease, or unspecified chronic kidney disease: Secondary | ICD-10-CM | POA: Insufficient documentation

## 2021-02-28 DIAGNOSIS — F32A Depression, unspecified: Secondary | ICD-10-CM | POA: Insufficient documentation

## 2021-02-28 DIAGNOSIS — E1122 Type 2 diabetes mellitus with diabetic chronic kidney disease: Secondary | ICD-10-CM

## 2021-02-28 DIAGNOSIS — F329 Major depressive disorder, single episode, unspecified: Secondary | ICD-10-CM

## 2021-02-28 DIAGNOSIS — I5022 Chronic systolic (congestive) heart failure: Secondary | ICD-10-CM | POA: Insufficient documentation

## 2021-02-28 DIAGNOSIS — Z794 Long term (current) use of insulin: Secondary | ICD-10-CM | POA: Insufficient documentation

## 2021-02-28 DIAGNOSIS — R0789 Other chest pain: Secondary | ICD-10-CM | POA: Insufficient documentation

## 2021-02-28 DIAGNOSIS — F419 Anxiety disorder, unspecified: Secondary | ICD-10-CM | POA: Insufficient documentation

## 2021-02-28 DIAGNOSIS — Z8249 Family history of ischemic heart disease and other diseases of the circulatory system: Secondary | ICD-10-CM | POA: Insufficient documentation

## 2021-02-28 DIAGNOSIS — Z9049 Acquired absence of other specified parts of digestive tract: Secondary | ICD-10-CM | POA: Insufficient documentation

## 2021-02-28 DIAGNOSIS — N184 Chronic kidney disease, stage 4 (severe): Secondary | ICD-10-CM | POA: Insufficient documentation

## 2021-02-28 DIAGNOSIS — R0602 Shortness of breath: Secondary | ICD-10-CM | POA: Insufficient documentation

## 2021-02-28 DIAGNOSIS — M549 Dorsalgia, unspecified: Secondary | ICD-10-CM | POA: Insufficient documentation

## 2021-02-28 DIAGNOSIS — G479 Sleep disorder, unspecified: Secondary | ICD-10-CM | POA: Insufficient documentation

## 2021-02-28 DIAGNOSIS — Z833 Family history of diabetes mellitus: Secondary | ICD-10-CM | POA: Insufficient documentation

## 2021-02-28 NOTE — Patient Instructions (Addendum)
Resume weighing daily and call for an overnight weight gain of > 2 pounds or a weekly weight gain of >5 pounds. 

## 2021-05-19 DIAGNOSIS — I82A12 Acute embolism and thrombosis of left axillary vein: Secondary | ICD-10-CM

## 2021-05-19 HISTORY — DX: Acute embolism and thrombosis of left axillary vein: I82.A12

## 2021-05-23 ENCOUNTER — Other Ambulatory Visit
Admission: RE | Admit: 2021-05-23 | Discharge: 2021-05-23 | Disposition: A | Discharge status: Home / Self Care | Payer: Medicaid Other | Attending: Nephrology | Admitting: Nephrology

## 2021-05-23 DIAGNOSIS — N185 Chronic kidney disease, stage 5: Secondary | ICD-10-CM | POA: Insufficient documentation

## 2021-05-23 DIAGNOSIS — E1122 Type 2 diabetes mellitus with diabetic chronic kidney disease: Secondary | ICD-10-CM | POA: Diagnosis not present

## 2021-05-23 LAB — COMPREHENSIVE METABOLIC PANEL
ALT: 42 U/L (ref 0–44)
AST: 27 U/L (ref 15–41)
Albumin: 3.7 g/dL (ref 3.5–5.0)
Alkaline Phosphatase: 80 U/L (ref 38–126)
Anion gap: 6 (ref 5–15)
BUN: 71 mg/dL — ABNORMAL HIGH (ref 6–20)
CO2: 20 mmol/L — ABNORMAL LOW (ref 22–32)
Calcium: 8.9 mg/dL (ref 8.9–10.3)
Chloride: 112 mmol/L — ABNORMAL HIGH (ref 98–111)
Creatinine, Ser: 3.98 mg/dL — ABNORMAL HIGH (ref 0.61–1.24)
GFR, Estimated: 17 mL/min — ABNORMAL LOW (ref >60)
Glucose, Bld: 259 mg/dL — ABNORMAL HIGH (ref 70–99)
Potassium: 5.2 mmol/L — ABNORMAL HIGH (ref 3.5–5.1)
Sodium: 138 mmol/L (ref 135–145)
Total Bilirubin: 0.7 mg/dL (ref 0.3–1.2)
Total Protein: 6.6 g/dL (ref 6.5–8.1)

## 2021-05-23 LAB — CBC
HCT: 24.3 % — ABNORMAL LOW (ref 39.0–52.0)
Hemoglobin: 8.4 g/dL — ABNORMAL LOW (ref 13.0–17.0)
MCH: 30.8 pg (ref 26.0–34.0)
MCHC: 34.6 g/dL (ref 30.0–36.0)
MCV: 89 fL (ref 80.0–100.0)
Platelets: 180 10*3/uL (ref 150–400)
RBC: 2.73 MIL/uL — ABNORMAL LOW (ref 4.22–5.81)
RDW: 12.1 % (ref 11.5–15.5)
WBC: 4.6 10*3/uL (ref 4.0–10.5)
nRBC: 0 % (ref 0.0–0.2)

## 2021-05-23 LAB — HEPATITIS B SURFACE ANTIGEN: Hepatitis B Surface Ag: NONREACTIVE

## 2021-05-24 LAB — HEPATITIS B SURFACE ANTIBODY, QUANTITATIVE: Hep B S AB Quant (Post): <3.1 m[IU]/mL — ABNORMAL LOW (ref >9.9)

## 2021-05-25 LAB — QUANTIFERON-TB GOLD PLUS (RQFGPL)
QuantiFERON Mitogen Value: >10 IU/mL
QuantiFERON Nil Value: 0.01 IU/mL
QuantiFERON TB1 Ag Value: 0.01 IU/mL
QuantiFERON TB2 Ag Value: 0 IU/mL

## 2021-05-25 LAB — QUANTIFERON-TB GOLD PLUS: QuantiFERON-TB Gold Plus: NEGATIVE

## 2021-05-31 ENCOUNTER — Emergency Department: Payer: Medicaid Other

## 2021-05-31 ENCOUNTER — Inpatient Hospital Stay
Admission: EM | Admit: 2021-05-31 | Discharge: 2021-06-07 | DRG: 280 | Disposition: A | Discharge status: Home Health | Payer: Medicaid Other | Attending: Internal Medicine | Admitting: Internal Medicine

## 2021-05-31 ENCOUNTER — Encounter: Payer: Self-pay | Admitting: Internal Medicine

## 2021-05-31 DIAGNOSIS — I2510 Atherosclerotic heart disease of native coronary artery without angina pectoris: Secondary | ICD-10-CM | POA: Diagnosis present

## 2021-05-31 DIAGNOSIS — Z833 Family history of diabetes mellitus: Secondary | ICD-10-CM

## 2021-05-31 DIAGNOSIS — I5023 Acute on chronic systolic (congestive) heart failure: Secondary | ICD-10-CM | POA: Diagnosis present

## 2021-05-31 DIAGNOSIS — N179 Acute kidney failure, unspecified: Secondary | ICD-10-CM | POA: Diagnosis present

## 2021-05-31 DIAGNOSIS — I2489 Other forms of acute ischemic heart disease: Secondary | ICD-10-CM | POA: Diagnosis present

## 2021-05-31 DIAGNOSIS — I214 Non-ST elevation (NSTEMI) myocardial infarction: Secondary | ICD-10-CM | POA: Diagnosis present

## 2021-05-31 DIAGNOSIS — Z841 Family history of disorders of kidney and ureter: Secondary | ICD-10-CM

## 2021-05-31 DIAGNOSIS — D649 Anemia, unspecified: Secondary | ICD-10-CM

## 2021-05-31 DIAGNOSIS — N186 End stage renal disease: Secondary | ICD-10-CM | POA: Diagnosis present

## 2021-05-31 DIAGNOSIS — Z8616 Personal history of COVID-19: Secondary | ICD-10-CM

## 2021-05-31 DIAGNOSIS — Z992 Dependence on renal dialysis: Secondary | ICD-10-CM

## 2021-05-31 DIAGNOSIS — D631 Anemia in chronic kidney disease: Secondary | ICD-10-CM | POA: Diagnosis present

## 2021-05-31 DIAGNOSIS — R0602 Shortness of breath: Secondary | ICD-10-CM | POA: Diagnosis present

## 2021-05-31 DIAGNOSIS — I509 Heart failure, unspecified: Secondary | ICD-10-CM

## 2021-05-31 DIAGNOSIS — J9601 Acute respiratory failure with hypoxia: Secondary | ICD-10-CM | POA: Diagnosis present

## 2021-05-31 DIAGNOSIS — I10 Essential (primary) hypertension: Secondary | ICD-10-CM

## 2021-05-31 DIAGNOSIS — Z794 Long term (current) use of insulin: Secondary | ICD-10-CM

## 2021-05-31 DIAGNOSIS — I248 Other forms of acute ischemic heart disease: Secondary | ICD-10-CM | POA: Diagnosis not present

## 2021-05-31 DIAGNOSIS — Z8249 Family history of ischemic heart disease and other diseases of the circulatory system: Secondary | ICD-10-CM

## 2021-05-31 DIAGNOSIS — N189 Chronic kidney disease, unspecified: Secondary | ICD-10-CM | POA: Diagnosis present

## 2021-05-31 DIAGNOSIS — I132 Hypertensive heart and chronic kidney disease with heart failure and with stage 5 chronic kidney disease, or end stage renal disease: Principal | ICD-10-CM | POA: Diagnosis present

## 2021-05-31 DIAGNOSIS — Z79899 Other long term (current) drug therapy: Secondary | ICD-10-CM

## 2021-05-31 DIAGNOSIS — E1122 Type 2 diabetes mellitus with diabetic chronic kidney disease: Secondary | ICD-10-CM | POA: Diagnosis present

## 2021-05-31 DIAGNOSIS — E785 Hyperlipidemia, unspecified: Secondary | ICD-10-CM | POA: Diagnosis present

## 2021-05-31 DIAGNOSIS — I161 Hypertensive emergency: Secondary | ICD-10-CM | POA: Diagnosis not present

## 2021-05-31 DIAGNOSIS — Z20822 Contact with and (suspected) exposure to covid-19: Secondary | ICD-10-CM | POA: Diagnosis present

## 2021-05-31 DIAGNOSIS — Z862 Personal history of diseases of the blood and blood-forming organs and certain disorders involving the immune mechanism: Secondary | ICD-10-CM | POA: Diagnosis present

## 2021-05-31 DIAGNOSIS — I82A11 Acute embolism and thrombosis of right axillary vein: Secondary | ICD-10-CM | POA: Diagnosis present

## 2021-05-31 DIAGNOSIS — S37009A Unspecified injury of unspecified kidney, initial encounter: Secondary | ICD-10-CM

## 2021-05-31 DIAGNOSIS — I5043 Acute on chronic combined systolic (congestive) and diastolic (congestive) heart failure: Secondary | ICD-10-CM | POA: Diagnosis present

## 2021-05-31 DIAGNOSIS — J189 Pneumonia, unspecified organism: Secondary | ICD-10-CM | POA: Diagnosis present

## 2021-05-31 HISTORY — DX: Other cardiomyopathies: I42.8

## 2021-05-31 HISTORY — DX: Atherosclerotic heart disease of native coronary artery without angina pectoris: I25.10

## 2021-05-31 HISTORY — DX: Chronic systolic (congestive) heart failure: I50.22

## 2021-05-31 LAB — BASIC METABOLIC PANEL
Anion gap: 9 (ref 5–15)
BUN: 69 mg/dL — ABNORMAL HIGH (ref 6–20)
CO2: 21 mmol/L — ABNORMAL LOW (ref 22–32)
Calcium: 9 mg/dL (ref 8.9–10.3)
Chloride: 106 mmol/L (ref 98–111)
Creatinine, Ser: 4.61 mg/dL — ABNORMAL HIGH (ref 0.61–1.24)
GFR, Estimated: 14 mL/min — ABNORMAL LOW (ref >60)
Glucose, Bld: 295 mg/dL — ABNORMAL HIGH (ref 70–99)
Potassium: 5.3 mmol/L — ABNORMAL HIGH (ref 3.5–5.1)
Sodium: 136 mmol/L (ref 135–145)

## 2021-05-31 LAB — CBG MONITORING, ED: Glucose-Capillary: 310 mg/dL — ABNORMAL HIGH (ref 70–99)

## 2021-05-31 LAB — CBC WITH DIFFERENTIAL/PLATELET
Abs Immature Granulocytes: 0.03 10*3/uL (ref 0.00–0.07)
Basophils Absolute: 0.1 10*3/uL (ref 0.0–0.1)
Basophils Relative: 1 %
Eosinophils Absolute: 0.3 10*3/uL (ref 0.0–0.5)
Eosinophils Relative: 4 %
HCT: 28.6 % — ABNORMAL LOW (ref 39.0–52.0)
Hemoglobin: 9.5 g/dL — ABNORMAL LOW (ref 13.0–17.0)
Immature Granulocytes: 0 %
Lymphocytes Relative: 35 %
Lymphs Abs: 3.1 10*3/uL (ref 0.7–4.0)
MCH: 30.4 pg (ref 26.0–34.0)
MCHC: 33.2 g/dL (ref 30.0–36.0)
MCV: 91.4 fL (ref 80.0–100.0)
Monocytes Absolute: 0.5 10*3/uL (ref 0.1–1.0)
Monocytes Relative: 5 %
Neutro Abs: 4.9 10*3/uL (ref 1.7–7.7)
Neutrophils Relative %: 55 %
Platelets: 231 10*3/uL (ref 150–400)
RBC: 3.13 MIL/uL — ABNORMAL LOW (ref 4.22–5.81)
RDW: 12.3 % (ref 11.5–15.5)
WBC: 8.9 10*3/uL (ref 4.0–10.5)
nRBC: 0 % (ref 0.0–0.2)

## 2021-05-31 LAB — TROPONIN I (HIGH SENSITIVITY): Troponin I (High Sensitivity): 241 ng/L (ref <18)

## 2021-05-31 LAB — RESP PANEL BY RT-PCR (FLU A&B, COVID) ARPGX2
Influenza A by PCR: NEGATIVE
Influenza B by PCR: NEGATIVE
SARS Coronavirus 2 by RT PCR: NEGATIVE

## 2021-05-31 LAB — BRAIN NATRIURETIC PEPTIDE: B Natriuretic Peptide: 1820.5 pg/mL — ABNORMAL HIGH (ref 0.0–100.0)

## 2021-05-31 MED ORDER — ASPIRIN 81 MG PO CHEW
324.0000 mg | CHEWABLE_TABLET | Freq: Once | ORAL | Status: AC
  Administered 2021-05-31: 324 mg via ORAL
  Filled 2021-05-31: qty 4

## 2021-05-31 MED ORDER — LORAZEPAM 2 MG/ML IJ SOLN
INTRAMUSCULAR | Status: AC
  Administered 2021-05-31: 1 mg via INTRAVENOUS
  Filled 2021-05-31: qty 1

## 2021-05-31 MED ORDER — NITROGLYCERIN IN D5W 200-5 MCG/ML-% IV SOLN
0.0000 ug/min | INTRAVENOUS | Status: DC
  Administered 2021-05-31: 100 ug/min via INTRAVENOUS
  Administered 2021-06-01: 10:00:00 5 ug/min via INTRAVENOUS
  Administered 2021-06-02 (×2): 133.333 ug/min via INTRAVENOUS
  Filled 2021-05-31 (×3): qty 250

## 2021-05-31 MED ORDER — LORAZEPAM 2 MG/ML IJ SOLN: 1.0000 mg | Freq: Once | INTRAMUSCULAR | Status: AC

## 2021-05-31 MED ORDER — FUROSEMIDE 10 MG/ML IJ SOLN
60.0000 mg | Freq: Once | INTRAMUSCULAR | Status: AC
  Administered 2021-05-31: 60 mg via INTRAVENOUS
  Filled 2021-05-31: qty 8

## 2021-05-31 NOTE — H&P (Addendum)
History and Physical    Jimmy Olson HFW:[id number] DOB: 10-29-1960 DOA: 05/31/2021  PCP: [facility] [place]    Patient coming from:  Home   Chief Complaint:  Shortness of breath   HPI:  Jimmy Olson is a 60 y.o. male seen in ed with complaints of shortness of breath that has been going on since earlier today.  Has progressively gotten worse throughout the day.  EMS was called and while attempting to provide supplemental oxygen patient did not tolerate the facemask at that point reported shortness of breath.  Patient denies any other complaints of headache nausea diarrhea fevers chills abdominal pain chest pain palpitations.  Pt has past medical history of chronic apathy, chronic kidney disease, heart disease, diabetes mellitus type 2. ED Course:  Vitals:   05/31/21 2330 05/31/21 2351 06/01/21 0000 06/01/21 0030  BP: (!) 192/101 (!) 175/94 (!) 166/86 (!) 173/113  Pulse: 91 88 86 81  Resp: 18 (!) 22 (!) 22 (!) 21  Temp:      TempSrc:      SpO2: 100% 100% 100% 100%  Weight:      Height:      The emergency room patient is alert awake oriented hypertensive in the systolics of 027X patient was started on BiPAP nitroglycerin drip and IV Lasix, troponin follow-up. The emergency room patient received aspirin 324, Lasix 60 mg IV, nitroglycerin drip currently. Pt has been told he will ne HD and currently he is making urine with no complaints or dysuria of anuria or incomplete emptying.    Review of Systems:  Review of Systems  Respiratory:  Positive for shortness of breath.   Cardiovascular:  Positive for leg swelling.  All other systems reviewed and are negative.   Past Medical History:  Diagnosis Date   Cardiomyopathy ([place])    a. 07/2009 MV: EF 48%; b. 08/2019 Echo: EF 45-50%. Global HK. Mod LVH. Gr1 DD. Nl RV fxn. Nl PASP.   Chest pain    a. 07/2009 MV: EF 48%. No ischemia.   Chronic kidney disease (CKD), stage IV (severe) (HCC)    Coronary artery disease     Diabetes mellitus without complication (HCC)    HFmrEF (heart failure with mildy reduced ejection fraction) ([place])    Hypertension     Past Surgical History:  Procedure Laterality Date   APPENDECTOMY     CARDIAC CATHETERIZATION     NASAL SINUS SURGERY     RIGHT/LEFT HEART CATH AND CORONARY ANGIOGRAPHY N/A 07/19/2020   Procedure: RIGHT/LEFT HEART CATH AND CORONARY ANGIOGRAPHY;  Surgeon: Wellington Hampshire, MD;  Location: [facility];  Service: Cardiovascular;  Laterality: N/A;     reports that he has never smoked. He has never used smokeless tobacco. He reports that he does not drink alcohol and does not use drugs.  No Known Allergies  Family History  Problem Relation Age of Onset   Kidney failure Mother        died @ 70   Heart failure Mother    Other Father        he never knew his father   Diabetes Brother     Prior to Admission medications   Medication Sig Start Date End Date Taking? Authorizing Provider  acetaminophen (TYLENOL) 325 MG tablet Take 325 mg by mouth every 6 (six) hours as needed. 09/20/20   [provider]  amLODipine (NORVASC) 10 MG tablet Take 1 tablet (10 mg total) by mouth every evening. Patient  not taking: Reported on 02/28/2021 01/21/21 04/21/21  Minna Merritts, MD  carvedilol (COREG) 6.25 MG tablet Take 1 tablet (6.25 mg total) by mouth 2 (two) times daily. 01/21/21   Minna Merritts, MD  cetirizine (ZYRTEC) 10 MG tablet Take 10 mg by mouth daily as needed for allergies.    [provider]  D3-50 1.25 MG (50000 UT) capsule Take 50,000 Units by mouth once a week. 10/21/20   [provider]  FLUoxetine (PROZAC) 10 MG capsule Take 10 mg by mouth 2 (two) times daily.    [provider]  GNP ASPIRIN 81 MG EC tablet Take 81 mg by mouth daily. 12/22/20   [provider]  hydrALAZINE (APRESOLINE) 100 MG tablet Take 1 tablet (100 mg total) by mouth 2 (two) times daily. May take extra as needed for blood pressure >160  01/21/21   Minna Merritts, MD  insulin detemir (LEVEMIR) 100 unit/ml SOLN Inject 35 Units into the skin daily.    [provider]  insulin lispro (HUMALOG) 100 UNIT/ML injection Inject 5 Units into the skin 3 (three) times daily before meals.    [provider]  isosorbide mononitrate (IMDUR) 30 MG 24 hr tablet Take 1 tablet (30 mg total) by mouth in the morning and at bedtime. 01/21/21   Minna Merritts, MD  nitroGLYCERIN (NITROSTAT) 0.4 MG SL tablet Place 1 tablet (0.4 mg total) under the tongue every 5 (five) minutes as needed for chest pain. 07/25/20   Jennye Boroughs, MD  PROAIR HFA 108 [phone] Base) MCG/ACT inhaler Inhale 2 puffs into the lungs every 4 (four) hours as needed. 10/07/20   [provider]  torsemide (DEMADEX) 20 MG tablet Take 1 tablet (20 mg total) by mouth daily as needed. For swelling, weight gain, or shortness of breath 01/21/21   Minna Merritts, MD    Physical Exam: Vitals:   05/31/21 2330 05/31/21 2351 06/01/21 0000 06/01/21 0030  BP: (!) 192/101 (!) 175/94 (!) 166/86 (!) 173/113  Pulse: 91 88 86 81  Resp: 18 (!) 22 (!) 22 (!) 21  Temp:      TempSrc:      SpO2: 100% 100% 100% 100%  Weight:      Height:       Physical Exam Constitutional:      General: He is in acute distress.     Appearance: He is ill-appearing.  HENT:     Head: Normocephalic and atraumatic.     Right Ear: External ear normal.     Left Ear: External ear normal.  Eyes:     Extraocular Movements: Extraocular movements intact.  Cardiovascular:     Rate and Rhythm: Normal rate and regular rhythm.     Pulses: Normal pulses.     Heart sounds: Normal heart sounds.  Pulmonary:     Effort: Tachypnea and respiratory distress present.     Breath sounds: Wheezing present.  Abdominal:     General: Bowel sounds are normal. There is no distension.     Palpations: Abdomen is soft. There is no mass.     Tenderness: There is no abdominal tenderness. There is no guarding.      Hernia: No hernia is present.  Musculoskeletal:     Right lower leg: No edema.     Left lower leg: No edema.  Neurological:     General: No focal deficit present.     Mental Status: He is alert and oriented to person, place, and  time.     Cranial Nerves: No cranial nerve deficit.     Motor: No weakness.  Psychiatric:        Mood and Affect: Mood normal.        Behavior: Behavior normal.   Labs on Admission: I have personally reviewed following labs and imaging studies  No results for input(s): CKTOTAL, CKMB, TROPONINI in the last 72 hours. Lab Results  Component Value Date   WBC 8.9 05/31/2021   HGB 9.5 (L) 05/31/2021   HCT 28.6 (L) 05/31/2021   MCV 91.4 05/31/2021   PLT 231 05/31/2021    Recent Labs  Lab 05/31/21 2210  NA 136  K 5.3*  CL 106  CO2 21*  BUN 69*  CREATININE 4.61*  CALCIUM 9.0  GLUCOSE 295*   Lab Results  Component Value Date   CHOL 186 07/16/2020   HDL 40 (L) 07/16/2020   LDLCALC 110 (H) 07/16/2020   TRIG 178 (H) 07/16/2020   No results found for: DDIMER Invalid input(s): POCBNP   COVID-19 Labs No results for input(s): DDIMER, FERRITIN, LDH, CRP in the last 72 hours. Lab Results  Component Value Date   SARSCOV2NAA NEGATIVE 05/31/2021   SARSCOV2NAA NEGATIVE 12/23/2020   [place] NEGATIVE 07/15/2020   SARSCOV2NAA POSITIVE (A) 03/06/2020    Radiological Exams on Admission: DG Chest Portable 1 View  Result Date: 05/31/2021 CLINICAL DATA:  Respiratory distress and shortness of breath. EXAM: PORTABLE CHEST 1 VIEW COMPARISON:  PA Lat chest and chest CT both 12/23/2020 FINDINGS: Mild cardiomegaly is noted with increased central vascular distension and flow cephalization and diffuse increased septal lines consistent with interstitial edema with minimal pleural effusions beginning to develop. There is faint perihilar interstitial haziness which is probably due to ground-glass edema , pneumonitis possible but less likely. No focal consolidation is  seen. There is thoracic spondylosis. IMPRESSION: Cardiomegaly with CHF pattern including generalized interstitial edema, perihilar haziness which is probably ground-glass edema and minimal pleural effusions forming. Clinical correlation and radiographic follow-up recommended. Electronically Signed   By: Telford Nab M.D.   On: 05/31/2021 22:13    EKG: Independently reviewed.  T wave inversions in lead sinus tachycardia at 100,  Assessment/Plan: Principal Problem:   SOB (shortness of breath) Active Problems:   Acute on chronic HFrEF (heart failure with reduced ejection fraction) (HCC)   Demand ischemia (HCC)   Anemia   AKI (acute kidney injury) ([place]) Shortness of breath/acute on chronic congestive heart failure/pulmonary vascular congestion secondary to hypertensive urgency/elevated troponin-demand ischemia: Patient is a 60 year old Hispanic male that speaks limited English presenting due to hypertensive urgency and respiratory failure secondary to pulmonary vascular congestion secondary to congestive heart failure.He is in moderate distress and is on bipap currently, will admit to stepdown with low threshold to transfer to ICU if needed. We will admit patient to stepdown unit for respiratory failure and pulmonary vascular congestion and congestive heart failure. Strict I's and O's, daily weights. Supplemental oxygen currently on BiPAP. Patient received 60 mg of Lasix IV, and has not had any output yet.  Acute on chronic congestive heart failure: We will continue aspirin 81, Coreg, as needed Lasix after first 24 hours scheduled dose, have heparin for DVT prophylaxis.  Aspiration, fall precautions/daily weights. Lasix 40 mg twice daily continued.  Demand ischemia/elevated troponin: We will cycle troponin.  If troponin continues to rise, will start heparin gtt. for NSTEMI. Repeat TNI is rising and we will start heparin drip per ACS/ UA/ NSTEMI protocol. Cont coreg, hydralazine  and  aspirin  Anemia: IV PPI therapy.  09/14/19 05:33 09/15/19 04:05 09/29/19 10:25 03/06/20 12:55 07/15/20 03:59 07/16/20 05:32 07/17/20 06:15 07/18/20 05:52 07/19/20 01:52 07/20/20 04:43 12/23/20 16:57 12/24/20 04:29 05/23/21 10:51 05/31/21 22:10  Hemoglobin 9.9 (L) 9.7 (L) 11.6 (L) 10.8 (L) 11.9 (L) 11.7 (L) 12.1 (L) 11.8 (L) 10.9 (L) 9.5 (L) 8.9 (L) 9.8 (L) 8.4 (L) 9.5 (L)  Attribute to anemic of chronic kidney disease.  AKI: Lab Results  Component Value Date   CREATININE 4.61 (H) 05/31/2021   CREATININE 3.98 (H) 05/23/2021   CREATININE 3.85 (H) 12/24/2020  Nephrology consult as deemed appropriate.  Avoid contrast studies and renally dose meds.   DVT prophylaxis:  Heparin.  Code Status:  Full code.  Family Communication:  PAETON, STUDER (Spouse)  [phone] (Home Phone)   Disposition Plan:  Home.  Consults called:  None.  Admission status: Inpatient.   Para Skeans MD [facility] [phone] How to contact the [facility] Attending or Consulting provider [place] or covering provider during after hours [place], for this patient.    Check the care team in [facility] and look for a) attending/consulting [facility] provider listed and b) the [facility] team listed Log into [email or web address] and use [place]'s universal password to access. If you do not have the password, please contact the hospital operator. Locate the [facility] provider you are looking for under [facility] and page to a number that you can be directly reached. If you still have difficulty reaching the provider, please page the [facility] (Director on Call) for the Hospitalists listed on amion for assistance. [email or web address] Password [email or web address] 06/01/2021, 12:55 AM

## 2021-05-31 NOTE — ED Triage Notes (Signed)
Pt brought in from home per EMS for c/o SOB, initial SPO2=97% RA.  Pt present to ED AA, spanish speaking. Appears very anxious.

## 2021-05-31 NOTE — ED Provider Notes (Addendum)
[facility] Emergency Department Provider Note   ____________________________________________   I have reviewed the triage vital signs and the nursing notes.   HISTORY  Chief Complaint Shortness of Breath   History limited by: Respiratory distress  HPI Jimmy Olson is a 60 y.o. male who presents to the emergency department today because of concerns for shortness of breath.  It appears that it got significantly worse today.  When EMS arrived he was quite tachypneic.  He would not tolerate facemask by EMS.  Patient denies any associated chest pain.  Has been coughing up some phlegm.  Denies any fevers.    Records reviewed. Per medical record review patient has a history of heart failure.   Past Medical History:  Diagnosis Date   Cardiomyopathy ([place])    a. 07/2009 MV: EF 48%; b. 08/2019 Echo: EF 45-50%. Global HK. Mod LVH. Gr1 DD. Nl RV fxn. Nl PASP.   Chest pain    a. 07/2009 MV: EF 48%. No ischemia.   Chronic kidney disease (CKD), stage IV (severe) (HCC)    Coronary artery disease    Diabetes mellitus without complication (HCC)    HFmrEF (heart failure with mildy reduced ejection fraction) ([place])    Hypertension     Patient Active Problem List   Diagnosis Date Noted   Hyperkalemia 12/23/2020   Chronic combined systolic and diastolic CHF (congestive heart failure) ([place]) 12/23/2020   CKD (chronic kidney disease), stage IV ([place]) 12/23/2020   Hyperlipidemia associated with type 2 diabetes mellitus ([place]) 12/23/2020   Coronary artery disease involving native coronary artery of native heart without angina pectoris    Demand ischemia (HCC)    ATN (acute tubular necrosis) (HCC)    Severe recurrent major depression without psychotic features ([place]) 07/16/2020   Elevated troponin 07/15/2020   Type II diabetes mellitus with renal manifestations ([place]) 07/15/2020   Acute on chronic HFrEF (heart failure with reduced ejection fraction) ([name]) 07/15/2020   Acute renal  failure superimposed on stage 4 chronic kidney disease ([place]) 07/15/2020   HLD (hyperlipidemia) 07/15/2020   HFrEF (heart failure with reduced ejection fraction) ([place])    Hypertension associated with diabetes ([place])    Acute on chronic respiratory failure with hypoxia ([place]) 09/13/2019   CAP (community acquired pneumonia) 09/11/2019   Acute respiratory failure with hypercapnia ([place]) 09/11/2019   Accelerated hypertension 09/11/2019   Type 2 diabetes mellitus with hyperlipidemia ([place]) 09/11/2019   Type 2 diabetes mellitus with other specified complication ([place]) 09/98/3382   AKI (acute kidney injury) ([place]) 09/11/2019    Past Surgical History:  Procedure Laterality Date   APPENDECTOMY     CARDIAC CATHETERIZATION     NASAL SINUS SURGERY     RIGHT/LEFT HEART CATH AND CORONARY ANGIOGRAPHY N/A 07/19/2020   Procedure: RIGHT/LEFT HEART CATH AND CORONARY ANGIOGRAPHY;  Surgeon: Wellington Hampshire, MD;  Location: [facility];  Service: Cardiovascular;  Laterality: N/A;    Prior to Admission medications   Medication Sig Start Date End Date Taking? Authorizing Provider  acetaminophen (TYLENOL) 325 MG tablet Take 325 mg by mouth every 6 (six) hours as needed. 09/20/20   [provider]  amLODipine (NORVASC) 10 MG tablet Take 1 tablet (10 mg total) by mouth every evening. Patient not taking: Reported on 02/28/2021 01/21/21 04/21/21  Minna Merritts, MD  carvedilol (COREG) 6.25 MG tablet Take 1 tablet (6.25 mg total) by mouth 2 (two) times daily. 01/21/21   Minna Merritts, MD  cetirizine (ZYRTEC) 10  MG tablet Take 10 mg by mouth daily as needed for allergies.    [provider]  D3-50 1.25 MG (50000 UT) capsule Take 50,000 Units by mouth once a week. 10/21/20   [provider]  FLUoxetine (PROZAC) 10 MG capsule Take 10 mg by mouth 2 (two) times daily.    [provider]  GNP ASPIRIN 81 MG EC tablet Take 81 mg by mouth daily. 12/22/20   [provider]   hydrALAZINE (APRESOLINE) 100 MG tablet Take 1 tablet (100 mg total) by mouth 2 (two) times daily. May take extra as needed for blood pressure >160 01/21/21   Minna Merritts, MD  insulin detemir (LEVEMIR) 100 unit/ml SOLN Inject 35 Units into the skin daily.    [provider]  insulin lispro (HUMALOG) 100 UNIT/ML injection Inject 5 Units into the skin 3 (three) times daily before meals.    [provider]  isosorbide mononitrate (IMDUR) 30 MG 24 hr tablet Take 1 tablet (30 mg total) by mouth in the morning and at bedtime. 01/21/21   Minna Merritts, MD  nitroGLYCERIN (NITROSTAT) 0.4 MG SL tablet Place 1 tablet (0.4 mg total) under the tongue every 5 (five) minutes as needed for chest pain. 07/25/20   Jennye Boroughs, MD  PROAIR HFA 108 [phone] Base) MCG/ACT inhaler Inhale 2 puffs into the lungs every 4 (four) hours as needed. 10/07/20   [provider]  torsemide (DEMADEX) 20 MG tablet Take 1 tablet (20 mg total) by mouth daily as needed. For swelling, weight gain, or shortness of breath 01/21/21   Minna Merritts, MD    Allergies Patient has no known allergies.  Family History  Problem Relation Age of Onset   Kidney failure Mother        died @ 22   Heart failure Mother    Other Father        he never knew his father   Diabetes Brother     Social History Social History   Tobacco Use   Smoking status: Never   Smokeless tobacco: Never  Vaping Use   Vaping Use: Never used  Substance Use Topics   Alcohol use: Never   Drug use: Never    Review of Systems Constitutional: No fever/chills Eyes: No visual changes. ENT: No sore throat. Cardiovascular: Denies chest pain. Respiratory: Positive for shortness of breath. Gastrointestinal: No abdominal pain.  No nausea, no vomiting.  No diarrhea.   Genitourinary: Negative for dysuria. Musculoskeletal: Negative for back pain. Skin: Negative for rash. Neurological: Negative for headaches, focal weakness or  numbness.  ____________________________________________   PHYSICAL EXAM:  VITAL SIGNS: ED Triage Vitals  Enc Vitals Group     BP 05/31/21 2230 (!) 237/107     Pulse Rate 05/31/21 2206 99     Resp 05/31/21 2206 (!) 23     Temp 05/31/21 2206 97.6 F (36.4 C)     Temp Source 05/31/21 2206 Oral     SpO2 05/31/21 2146 100 %     Weight 05/31/21 2207 165 lb 5.5 oz (75 kg)     Height 05/31/21 2207 5\' 2"  (1.575 m)    Constitutional: Alert and oriented.  Eyes: Conjunctivae are normal.  ENT      Head: Normocephalic and atraumatic.      Nose: No congestion/rhinnorhea.      Mouth/Throat: Mucous membranes are moist.      Neck: No stridor. Hematological/Lymphatic/Immunilogical: No cervical lymphadenopathy. Cardiovascular: Tachycardia, regular rhythm.  No  murmurs, rubs, or gallops.  Respiratory: Increased respiratory effort. Diffuse rales.  Gastrointestinal: Soft and non tender. No rebound. No guarding.  Genitourinary: Deferred Musculoskeletal: Normal range of motion in all extremities. No lower extremity edema. Neurologic:  Normal speech and language. No gross focal neurologic deficits are appreciated.  Skin:  Skin is warm, dry and intact. No rash noted. Psychiatric: Mood and affect are normal. Speech and behavior are normal. Patient exhibits appropriate insight and judgment.  ____________________________________________    LABS (pertinent positives/negatives)  COVID/influenza negative CBC wbc 8.9, hgb 9.5, plt 231 BMP na 136, k 5.3, glu 295, cr 4.61 Trop hs 241 ____________________________________________   EKG  I, Nance Pear, attending physician, personally viewed and interpreted this EKG  EKG Time: 2205 Rate: 100 Rhythm: sinus tachycardia Axis: normal Intervals: qtc 461 QRS: narrow, q waves v1 ST changes: no st elevation Impression: abnormal ekg ____________________________________________    RADIOLOGY  CXR Diffuse pulmonary  edema.  ____________________________________________   PROCEDURES  Procedures  CRITICAL CARE Performed by: Nance Pear   Total critical care time: 30 minutes  Critical care time was exclusive of separately billable procedures and treating other patients.  Critical care was necessary to treat or prevent imminent or life-threatening deterioration.  Critical care was time spent personally by me on the following activities: development of treatment plan with patient and/or surrogate as well as nursing, discussions with consultants, evaluation of patient's response to treatment, examination of patient, obtaining history from patient or surrogate, ordering and performing treatments and interventions, ordering and review of laboratory studies, ordering and review of radiographic studies, pulse oximetry and re-evaluation of patient's condition.  ____________________________________________   INITIAL IMPRESSION / ASSESSMENT AND PLAN / ED COURSE  Pertinent labs & imaging results that were available during my care of the patient were reviewed by me and considered in my medical decision making (see chart for details).   Patient presented to the emergency department today because of concerns for shortness of breath.  On exam patient was in respiratory distress with diffuse Rales on auscultation.  Patient was found to be significantly hypertensive.  Patient has history of heart failure.  Did have concerns for pulmonary edema.  Patient was written for nitro drip and Lasix.  Additionally patient was placed on BiPAP.  Initially when patient arrived he was quite anxious so he was given some Ativan to help patient tolerate treatment.  Patient's troponin did come back elevated.  Per chart review when he had had previous admission for CHF he also had elevated troponin at that time.  He did undergo cath which did not show any concerning lesions.  This was roughly 1 year ago.  At this time will give aspirin  but will hold off on heparin given low suspicion for true ACS at this time I think more likely related to the heart failure.  Patient will require admission to the hospital service.   ____________________________________________   FINAL CLINICAL IMPRESSION(S) / ED DIAGNOSES  Final diagnoses:  Congestive heart failure, unspecified HF chronicity, unspecified heart failure type ([place])  Hypertension, unspecified type     Note: This dictation was prepared with Dragon dictation. Any transcriptional errors that result from this process are unintentional     Nance Pear, MD 05/31/21 2320    Nance Pear, MD 05/31/21 2325

## 2021-06-01 ENCOUNTER — Encounter: Payer: Self-pay | Admitting: Internal Medicine

## 2021-06-01 ENCOUNTER — Other Ambulatory Visit (INDEPENDENT_AMBULATORY_CARE_PROVIDER_SITE_OTHER): Payer: Self-pay | Admitting: Vascular Surgery

## 2021-06-01 ENCOUNTER — Inpatient Hospital Stay
Admit: 2021-06-01 | Discharge: 2021-06-01 | Disposition: A | Discharge status: Home / Self Care | Payer: Medicaid Other | Attending: Internal Medicine | Admitting: Internal Medicine

## 2021-06-01 ENCOUNTER — Other Ambulatory Visit: Payer: Self-pay

## 2021-06-01 DIAGNOSIS — I5043 Acute on chronic combined systolic (congestive) and diastolic (congestive) heart failure: Secondary | ICD-10-CM | POA: Diagnosis present

## 2021-06-01 DIAGNOSIS — J9601 Acute respiratory failure with hypoxia: Secondary | ICD-10-CM | POA: Diagnosis present

## 2021-06-01 DIAGNOSIS — R0602 Shortness of breath: Secondary | ICD-10-CM | POA: Diagnosis present

## 2021-06-01 DIAGNOSIS — Z841 Family history of disorders of kidney and ureter: Secondary | ICD-10-CM | POA: Diagnosis not present

## 2021-06-01 DIAGNOSIS — E1122 Type 2 diabetes mellitus with diabetic chronic kidney disease: Secondary | ICD-10-CM | POA: Diagnosis present

## 2021-06-01 DIAGNOSIS — I132 Hypertensive heart and chronic kidney disease with heart failure and with stage 5 chronic kidney disease, or end stage renal disease: Secondary | ICD-10-CM | POA: Diagnosis present

## 2021-06-01 DIAGNOSIS — N189 Chronic kidney disease, unspecified: Secondary | ICD-10-CM | POA: Diagnosis present

## 2021-06-01 DIAGNOSIS — R778 Other specified abnormalities of plasma proteins: Secondary | ICD-10-CM | POA: Diagnosis not present

## 2021-06-01 DIAGNOSIS — I5023 Acute on chronic systolic (congestive) heart failure: Secondary | ICD-10-CM

## 2021-06-01 DIAGNOSIS — I82A11 Acute embolism and thrombosis of right axillary vein: Secondary | ICD-10-CM | POA: Diagnosis present

## 2021-06-01 DIAGNOSIS — Z833 Family history of diabetes mellitus: Secondary | ICD-10-CM | POA: Diagnosis not present

## 2021-06-01 DIAGNOSIS — Z20822 Contact with and (suspected) exposure to covid-19: Secondary | ICD-10-CM | POA: Diagnosis present

## 2021-06-01 DIAGNOSIS — I161 Hypertensive emergency: Secondary | ICD-10-CM | POA: Diagnosis not present

## 2021-06-01 DIAGNOSIS — I25118 Atherosclerotic heart disease of native coronary artery with other forms of angina pectoris: Secondary | ICD-10-CM | POA: Diagnosis not present

## 2021-06-01 DIAGNOSIS — I509 Heart failure, unspecified: Secondary | ICD-10-CM | POA: Diagnosis not present

## 2021-06-01 DIAGNOSIS — N185 Chronic kidney disease, stage 5: Secondary | ICD-10-CM

## 2021-06-01 DIAGNOSIS — Z8616 Personal history of COVID-19: Secondary | ICD-10-CM | POA: Diagnosis not present

## 2021-06-01 DIAGNOSIS — Z794 Long term (current) use of insulin: Secondary | ICD-10-CM | POA: Diagnosis not present

## 2021-06-01 DIAGNOSIS — I2510 Atherosclerotic heart disease of native coronary artery without angina pectoris: Secondary | ICD-10-CM | POA: Diagnosis present

## 2021-06-01 DIAGNOSIS — D631 Anemia in chronic kidney disease: Secondary | ICD-10-CM | POA: Diagnosis present

## 2021-06-01 DIAGNOSIS — I214 Non-ST elevation (NSTEMI) myocardial infarction: Secondary | ICD-10-CM | POA: Diagnosis present

## 2021-06-01 DIAGNOSIS — I248 Other forms of acute ischemic heart disease: Secondary | ICD-10-CM | POA: Diagnosis not present

## 2021-06-01 DIAGNOSIS — Z8249 Family history of ischemic heart disease and other diseases of the circulatory system: Secondary | ICD-10-CM | POA: Diagnosis not present

## 2021-06-01 DIAGNOSIS — Z992 Dependence on renal dialysis: Secondary | ICD-10-CM | POA: Diagnosis not present

## 2021-06-01 DIAGNOSIS — E785 Hyperlipidemia, unspecified: Secondary | ICD-10-CM | POA: Diagnosis present

## 2021-06-01 DIAGNOSIS — I469 Cardiac arrest, cause unspecified: Secondary | ICD-10-CM | POA: Diagnosis not present

## 2021-06-01 DIAGNOSIS — D649 Anemia, unspecified: Secondary | ICD-10-CM | POA: Diagnosis present

## 2021-06-01 DIAGNOSIS — Z79899 Other long term (current) drug therapy: Secondary | ICD-10-CM | POA: Diagnosis not present

## 2021-06-01 DIAGNOSIS — N186 End stage renal disease: Secondary | ICD-10-CM | POA: Diagnosis present

## 2021-06-01 DIAGNOSIS — Z862 Personal history of diseases of the blood and blood-forming organs and certain disorders involving the immune mechanism: Secondary | ICD-10-CM | POA: Diagnosis present

## 2021-06-01 LAB — CBC
HCT: 24.1 % — ABNORMAL LOW (ref 39.0–52.0)
HCT: 22.6 % — ABNORMAL LOW (ref 39.0–52.0)
Hemoglobin: 8 g/dL — ABNORMAL LOW (ref 13.0–17.0)
Hemoglobin: 7.5 g/dL — ABNORMAL LOW (ref 13.0–17.0)
MCH: 30 pg (ref 26.0–34.0)
MCH: 29.6 pg (ref 26.0–34.0)
MCHC: 33.2 g/dL (ref 30.0–36.0)
MCHC: 33.2 g/dL (ref 30.0–36.0)
MCV: 90.3 fL (ref 80.0–100.0)
MCV: 89.3 fL (ref 80.0–100.0)
Platelets: 183 10*3/uL (ref 150–400)
Platelets: 181 10*3/uL (ref 150–400)
RBC: 2.67 MIL/uL — ABNORMAL LOW (ref 4.22–5.81)
RBC: 2.53 MIL/uL — ABNORMAL LOW (ref 4.22–5.81)
RDW: 12.1 % (ref 11.5–15.5)
RDW: 12.1 % (ref 11.5–15.5)
WBC: 8.2 10*3/uL (ref 4.0–10.5)
WBC: 7 10*3/uL (ref 4.0–10.5)
nRBC: 0 % (ref 0.0–0.2)
nRBC: 0 % (ref 0.0–0.2)

## 2021-06-01 LAB — MAGNESIUM: Magnesium: 1.9 mg/dL (ref 1.7–2.4)

## 2021-06-01 LAB — HEPATITIS B SURFACE ANTIBODY,QUALITATIVE: Hep B S Ab: NONREACTIVE

## 2021-06-01 LAB — HEPATITIS B CORE ANTIBODY, TOTAL: Hep B Core Total Ab: NONREACTIVE

## 2021-06-01 LAB — CBG MONITORING, ED
Glucose-Capillary: 261 mg/dL — ABNORMAL HIGH (ref 70–99)
Glucose-Capillary: 70 mg/dL (ref 70–99)
Glucose-Capillary: 156 mg/dL — ABNORMAL HIGH (ref 70–99)

## 2021-06-01 LAB — CREATININE, SERUM
Creatinine, Ser: 4.46 mg/dL — ABNORMAL HIGH (ref 0.61–1.24)
GFR, Estimated: 15 mL/min — ABNORMAL LOW (ref >60)

## 2021-06-01 LAB — HEPARIN LEVEL (UNFRACTIONATED)
Heparin Unfractionated: 0.2 IU/mL — ABNORMAL LOW (ref 0.30–0.70)
Heparin Unfractionated: 0.37 IU/mL (ref 0.30–0.70)

## 2021-06-01 LAB — TROPONIN I (HIGH SENSITIVITY)
Troponin I (High Sensitivity): 370 ng/L (ref <18)
Troponin I (High Sensitivity): 1372 ng/L (ref <18)
Troponin I (High Sensitivity): 2862 ng/L (ref <18)
Troponin I (High Sensitivity): 3611 ng/L (ref <18)
Troponin I (High Sensitivity): 4844 ng/L (ref <18)

## 2021-06-01 LAB — PROTIME-INR
INR: 1.1 (ref 0.8–1.2)
Prothrombin Time: 13.7 seconds (ref 11.4–15.2)

## 2021-06-01 LAB — GLUCOSE, CAPILLARY: Glucose-Capillary: 217 mg/dL — ABNORMAL HIGH (ref 70–99)

## 2021-06-01 LAB — HEMOGLOBIN A1C
Hgb A1c MFr Bld: 8.7 % — ABNORMAL HIGH (ref 4.8–5.6)
Mean Plasma Glucose: 203 mg/dL

## 2021-06-01 LAB — APTT: aPTT: 25 seconds (ref 24–36)

## 2021-06-01 LAB — HEPATITIS B SURFACE ANTIGEN: Hepatitis B Surface Ag: NONREACTIVE

## 2021-06-01 MED ORDER — HEPARIN BOLUS VIA INFUSION
4000.0000 [IU] | Freq: Once | INTRAVENOUS | Status: AC
  Administered 2021-06-01: 03:00:00 4000 [IU] via INTRAVENOUS
  Filled 2021-06-01: qty 4000

## 2021-06-01 MED ORDER — ASPIRIN EC 81 MG PO TBEC
81.0000 mg | DELAYED_RELEASE_TABLET | Freq: Every day | ORAL | Status: DC
  Administered 2021-06-01 – 2021-06-07 (×6): 81 mg via ORAL
  Filled 2021-06-01 (×6): qty 1

## 2021-06-01 MED ORDER — ONDANSETRON HCL 4 MG/2ML IJ SOLN
4.0000 mg | Freq: Four times a day (QID) | INTRAMUSCULAR | Status: DC | PRN
  Filled 2021-06-01: qty 2

## 2021-06-01 MED ORDER — HYDRALAZINE HCL 20 MG/ML IJ SOLN
5.0000 mg | INTRAMUSCULAR | Status: DC | PRN
  Administered 2021-06-01: 03:00:00 5 mg via INTRAVENOUS
  Filled 2021-06-01: qty 1

## 2021-06-01 MED ORDER — DOCUSATE SODIUM 100 MG PO CAPS
100.0000 mg | ORAL_CAPSULE | Freq: Two times a day (BID) | ORAL | Status: DC
  Administered 2021-06-01 – 2021-06-07 (×12): 100 mg via ORAL
  Filled 2021-06-01 (×12): qty 1

## 2021-06-01 MED ORDER — ALBUTEROL SULFATE HFA 108 (90 BASE) MCG/ACT IN AERS: 2.0000 | INHALATION_SPRAY | RESPIRATORY_TRACT | Status: DC | PRN

## 2021-06-01 MED ORDER — ONDANSETRON HCL 4 MG PO TABS: 4.0000 mg | ORAL_TABLET | Freq: Four times a day (QID) | ORAL | Status: DC | PRN

## 2021-06-01 MED ORDER — MORPHINE SULFATE (PF) 2 MG/ML IV SOLN
2.0000 mg | INTRAVENOUS | Status: DC | PRN
  Administered 2021-06-01 – 2021-06-02 (×2): 2 mg via INTRAVENOUS
  Filled 2021-06-01 (×2): qty 1

## 2021-06-01 MED ORDER — ACETAMINOPHEN 325 MG PO TABS
650.0000 mg | ORAL_TABLET | Freq: Four times a day (QID) | ORAL | Status: DC | PRN
  Administered 2021-06-01: 14:00:00 650 mg via ORAL
  Filled 2021-06-01: qty 2

## 2021-06-01 MED ORDER — HYDRALAZINE HCL 50 MG PO TABS
50.0000 mg | ORAL_TABLET | Freq: Two times a day (BID) | ORAL | Status: DC
  Administered 2021-06-01 – 2021-06-02 (×3): 50 mg via ORAL
  Filled 2021-06-01 (×3): qty 1

## 2021-06-01 MED ORDER — CHLORHEXIDINE GLUCONATE CLOTH 2 % EX PADS
6.0000 | MEDICATED_PAD | Freq: Every day | CUTANEOUS | Status: DC
  Administered 2021-06-02 – 2021-06-07 (×6): 6 via TOPICAL
  Filled 2021-06-01: qty 6

## 2021-06-01 MED ORDER — INSULIN ASPART 100 UNIT/ML IJ SOLN
0.0000 [IU] | Freq: Three times a day (TID) | INTRAMUSCULAR | Status: DC
  Administered 2021-06-01: 08:00:00 8 [IU] via SUBCUTANEOUS
  Administered 2021-06-01: 17:00:00 3 [IU] via SUBCUTANEOUS
  Administered 2021-06-01: 11:00:00 0 [IU] via SUBCUTANEOUS
  Administered 2021-06-02 (×2): 3 [IU] via SUBCUTANEOUS
  Administered 2021-06-03: 18:00:00 5 [IU] via SUBCUTANEOUS
  Administered 2021-06-04 (×2): 3 [IU] via SUBCUTANEOUS
  Administered 2021-06-04: 5 [IU] via SUBCUTANEOUS
  Administered 2021-06-05: 17:00:00 8 [IU] via SUBCUTANEOUS
  Administered 2021-06-05 – 2021-06-06 (×3): 3 [IU] via SUBCUTANEOUS
  Administered 2021-06-06: 17:00:00 5 [IU] via SUBCUTANEOUS
  Administered 2021-06-07: 08:00:00 3 [IU] via SUBCUTANEOUS
  Administered 2021-06-07: 13:00:00 5 [IU] via SUBCUTANEOUS
  Filled 2021-06-01 (×14): qty 1

## 2021-06-01 MED ORDER — ATORVASTATIN CALCIUM 80 MG PO TABS
80.0000 mg | ORAL_TABLET | Freq: Every day | ORAL | Status: DC
  Administered 2021-06-01 – 2021-06-07 (×6): 80 mg via ORAL
  Filled 2021-06-01 (×3): qty 1
  Filled 2021-06-01: qty 4
  Filled 2021-06-01 (×2): qty 1

## 2021-06-01 MED ORDER — HEPARIN SODIUM (PORCINE) 5000 UNIT/ML IJ SOLN: 5000.0000 [IU] | Freq: Three times a day (TID) | INTRAMUSCULAR | Status: DC

## 2021-06-01 MED ORDER — POLYETHYLENE GLYCOL 3350 17 G PO PACK: 17.0000 g | PACK | Freq: Every day | ORAL | Status: DC | PRN

## 2021-06-01 MED ORDER — SODIUM CHLORIDE 0.9% FLUSH
3.0000 mL | Freq: Two times a day (BID) | INTRAVENOUS | Status: DC
  Administered 2021-06-01 – 2021-06-07 (×11): 3 mL via INTRAVENOUS

## 2021-06-01 MED ORDER — CARVEDILOL 6.25 MG PO TABS
6.2500 mg | ORAL_TABLET | Freq: Two times a day (BID) | ORAL | Status: DC
  Administered 2021-06-01 – 2021-06-02 (×3): 6.25 mg via ORAL
  Filled 2021-06-01 (×3): qty 1

## 2021-06-01 MED ORDER — NICOTINE 14 MG/24HR TD PT24
14.0000 mg | MEDICATED_PATCH | Freq: Every day | TRANSDERMAL | Status: DC
  Administered 2021-06-01 – 2021-06-02 (×2): 14 mg via TRANSDERMAL
  Filled 2021-06-01 (×2): qty 1

## 2021-06-01 MED ORDER — INSULIN DETEMIR 100 UNIT/ML FLEXPEN: 35.0000 [IU] | Freq: Every day | SUBCUTANEOUS | Status: DC

## 2021-06-01 MED ORDER — FUROSEMIDE 10 MG/ML IJ SOLN
40.0000 mg | Freq: Two times a day (BID) | INTRAMUSCULAR | Status: DC
  Administered 2021-06-01 – 2021-06-05 (×6): 40 mg via INTRAVENOUS
  Filled 2021-06-01 (×6): qty 4

## 2021-06-01 MED ORDER — ACETAMINOPHEN 650 MG RE SUPP
650.0000 mg | Freq: Four times a day (QID) | RECTAL | Status: DC | PRN
  Filled 2021-06-01: qty 1

## 2021-06-01 MED ORDER — ALBUTEROL SULFATE (2.5 MG/3ML) 0.083% IN NEBU: 2.5000 mg | INHALATION_SOLUTION | RESPIRATORY_TRACT | Status: DC | PRN

## 2021-06-01 MED ORDER — HEPARIN BOLUS VIA INFUSION
1000.0000 [IU] | Freq: Once | INTRAVENOUS | Status: AC
  Administered 2021-06-01: 12:00:00 1000 [IU] via INTRAVENOUS
  Filled 2021-06-01: qty 1000

## 2021-06-01 MED ORDER — OXYCODONE HCL 5 MG PO TABS
5.0000 mg | ORAL_TABLET | ORAL | Status: DC | PRN
  Administered 2021-06-02 – 2021-06-06 (×3): 5 mg via ORAL
  Filled 2021-06-01 (×3): qty 1

## 2021-06-01 MED ORDER — HEPARIN (PORCINE) 25000 UT/250ML-% IV SOLN
1100.0000 [IU]/h | INTRAVENOUS | Status: DC
Start: 2021-06-01 — End: 2021-06-03
  Administered 2021-06-01: 03:00:00 900 [IU]/h via INTRAVENOUS
  Administered 2021-06-02 – 2021-06-03 (×2): 1050 [IU]/h via INTRAVENOUS
  Filled 2021-06-01 (×3): qty 250

## 2021-06-01 NOTE — Progress Notes (Signed)
OT Cancellation Note  Patient Details Name: Jimmy Olson MRN: [id number] DOB: 02/25/1961   Cancelled Treatment:    Reason Eval/Treat Not Completed: Patient not medically ready. Order received and chart reviewed. Noted to have elevated troponin uptrending, per secure chat with MD, ok to hold until tomorrow to initiate therapy services. Will re-attempt as pt appropriate.   Dessie Coma, M.S. OTR/L  06/01/21, 8:56 AM  ascom [phone]

## 2021-06-01 NOTE — H&P (View-Only) (Signed)
[facility] Vascular Consult Note  MRN : [id number]  Jimmy Olson is a 60 y.o. (06/25/1960) male who presents with chief complaint of  Chief Complaint  Patient presents with   Shortness of Breath   History of Present Illness:  Jimmy Olson is a 60 year old male with a past medical history of chronic apathy, chronic kidney disease, heart disease, diabetes mellitus type 2 who presented to the [facility] emergency department with a chief complaint of shortness of breath.   The patient endorses a history of progressively worsening shortness of breath over the approximately one day. EMS was called and while attempting to provide supplemental oxygen the patient did not tolerate the facemask. Patient denies any other complaints of headache nausea diarrhea fevers chills abdominal pain chest pain palpitations.   Patient has a known history of chronic kidney disease.  He was evaluated by the nephrology service and they would like to initiate dialysis.  At this time, the patient does not have an adequate dialysis access and we were asked by Dr. Candiss Norse to place a PermCath.  Current Facility-Administered Medications  Medication Dose Route Frequency Provider Last Rate Last Admin   acetaminophen (TYLENOL) tablet 650 mg  650 mg Oral Q6H PRN Para Skeans, MD       Or   acetaminophen (TYLENOL) suppository 650 mg  650 mg Rectal Q6H PRN Para Skeans, MD       albuterol (PROVENTIL) (2.5 MG/3ML) 0.083% nebulizer solution 2.5 mg  2.5 mg Nebulization Q4H PRN Renda Rolls, RPH       aspirin EC tablet 81 mg  81 mg Oral Daily Florina Ou V, MD   81 mg at 06/01/21 0957   carvedilol (COREG) tablet 6.25 mg  6.25 mg Oral BID Florina Ou V, MD   6.25 mg at 06/01/21 1030   docusate sodium (COLACE) capsule 100 mg  100 mg Oral BID Florina Ou V, MD   100 mg at 06/01/21 0957   furosemide (LASIX) injection 40 mg  40 mg Intravenous BID Para Skeans, MD       heparin ADULT  infusion 100 units/mL (25000 units/271mL)  900 Units/hr Intravenous Continuous Renda Rolls, RPH 9 mL/hr at 06/01/21 1057 900 Units/hr at 06/01/21 1057   hydrALAZINE (APRESOLINE) injection 5 mg  5 mg Intravenous Q4H PRN Para Skeans, MD   5 mg at 06/01/21 0251   hydrALAZINE (APRESOLINE) tablet 50 mg  50 mg Oral BID Para Skeans, MD   50 mg at 06/01/21 0957   insulin aspart (novoLOG) injection 0-15 Units  0-15 Units Subcutaneous TID WC Florina Ou V, MD   0 Units at 06/01/21 1125   morphine 2 MG/ML injection 2 mg  2 mg Intravenous Q2H PRN Para Skeans, MD   2 mg at 06/01/21 8338   nicotine (NICODERM CQ - dosed in mg/24 hours) patch 14 mg  14 mg Transdermal Daily Florina Ou V, MD   14 mg at 06/01/21 0957   nitroGLYCERIN 50 mg in dextrose 5 % 250 mL (0.2 mg/mL) infusion  0-200 mcg/min Intravenous Continuous Nance Pear, MD 1.5 mL/hr at 06/01/21 1027 5 mcg/min at 06/01/21 1027   ondansetron (ZOFRAN) tablet 4 mg  4 mg Oral Q6H PRN Para Skeans, MD       Or   ondansetron (ZOFRAN) injection 4 mg  4 mg Intravenous Q6H PRN Para Skeans, MD       oxyCODONE (Oxy  IR/ROXICODONE) immediate release tablet 5 mg  5 mg Oral Q4H PRN Para Skeans, MD       polyethylene glycol (MIRALAX / GLYCOLAX) packet 17 g  17 g Oral Daily PRN Para Skeans, MD       sodium chloride flush (NS) 0.9 % injection 3 mL  3 mL Intravenous Q12H Para Skeans, MD   3 mL at 06/01/21 0903   Current Outpatient Medications  Medication Sig Dispense Refill   acetaminophen (TYLENOL) 325 MG tablet Take 325 mg by mouth every 6 (six) hours as needed.     amLODipine (NORVASC) 10 MG tablet Take 1 tablet (10 mg total) by mouth every evening. 90 tablet 3   atorvastatin (LIPITOR) 80 MG tablet Take 80 mg by mouth daily.     carvedilol (COREG) 25 MG tablet Take 25 mg by mouth 2 (two) times daily.     cetirizine (ZYRTEC) 10 MG tablet Take 10 mg by mouth daily as needed for allergies.     D3-50 1.25 MG (50000 UT) capsule Take 50,000 Units by  mouth once a week.     gabapentin (NEURONTIN) 100 MG capsule Take 200 mg by mouth at bedtime.     GNP ASPIRIN 81 MG EC tablet Take 81 mg by mouth daily.     hydrALAZINE (APRESOLINE) 100 MG tablet Take 1 tablet (100 mg total) by mouth 2 (two) times daily. May take extra as needed for blood pressure >160 200 tablet 3   insulin detemir (LEVEMIR) 100 unit/ml SOLN Inject 20 Units into the skin at bedtime.     isosorbide mononitrate (ISMO) 20 MG tablet Take 20 mg by mouth daily.     nitroGLYCERIN (NITROSTAT) 0.4 MG SL tablet Place 1 tablet (0.4 mg total) under the tongue every 5 (five) minutes as needed for chest pain. 30 tablet 0   PROAIR HFA 108 (90 Base) MCG/ACT inhaler Inhale 2 puffs into the lungs every 4 (four) hours as needed.     Vitamin D, Ergocalciferol, (DRISDOL) 1.25 MG (50000 UNIT) CAPS capsule Take 50,000 Units by mouth every 7 (seven) days.     carvedilol (COREG) 6.25 MG tablet Take 1 tablet (6.25 mg total) by mouth 2 (two) times daily. (Patient not taking: Reported on 06/01/2021) 180 tablet 3   FLUoxetine (PROZAC) 10 MG capsule Take 10 mg by mouth 2 (two) times daily. (Patient not taking: Reported on 06/01/2021)     insulin lispro (HUMALOG) 100 UNIT/ML injection Inject 3 Units into the skin 3 (three) times daily before meals.     isosorbide mononitrate (IMDUR) 30 MG 24 hr tablet Take 1 tablet (30 mg total) by mouth in the morning and at bedtime. (Patient not taking: Reported on 06/01/2021) 180 tablet 3   PARoxetine (PAXIL) 10 MG tablet Take 10 mg by mouth daily. (Patient not taking: Reported on 06/01/2021)     torsemide (DEMADEX) 20 MG tablet Take 1 tablet (20 mg total) by mouth daily as needed. For swelling, weight gain, or shortness of breath 90 tablet 3   Past Medical History:  Diagnosis Date   Chronic HFrEF (heart failure with reduced ejection fraction) ([place])    a. 06/2020 Echo: EF 30-35%, glob HK, GrII DD, nl RV fxn. Mild LAE.   Chronic kidney disease (CKD), stage IV (severe) ([place])     COVID-19 virus infection 02/2020   Diabetes mellitus without complication (HCC)    Hypertension    NICM (nonischemic cardiomyopathy) ([place])    a. 07/2009 MV: EF  48%; b. 08/2019 Echo: EF 45-50%. Global HK. Mod LVH. GrIDD; c. 06/2020 Echo: EF 30-35%, glob HK, mild LVH, GrII DD, nl RV fxn, mild LAE; d. 06/2020 Cath: nonobs dzs.   Nonobstructive CAD (coronary artery disease)    a. 07/2009 MV: EF 48%. No ischemia; b. 06/2020 Cath: LM min irregs, LAD 40p, 21m, D1 70, D2 min irregs, D3 nl, RI min irregs, LCX small, mild diff dzs, RCA 20p, 13m, 50d, RPAV 50-->Med Rx.   Past Surgical History:  Procedure Laterality Date   APPENDECTOMY     CARDIAC CATHETERIZATION     NASAL SINUS SURGERY     RIGHT/LEFT HEART CATH AND CORONARY ANGIOGRAPHY N/A 07/19/2020   Procedure: RIGHT/LEFT HEART CATH AND CORONARY ANGIOGRAPHY;  Surgeon: Wellington Hampshire, MD;  Location: [facility];  Service: Cardiovascular;  Laterality: N/A;   Social History Social History   Tobacco Use   Smoking status: Never   Smokeless tobacco: Never  Vaping Use   Vaping Use: Never used  Substance Use Topics   Alcohol use: Never   Drug use: Never   Family History Family History  Problem Relation Age of Onset   Kidney failure Mother        died @ 34   Heart failure Mother    Other Father        he never knew his father   Diabetes Brother   Positive for renal issues on his maternal side.  Denies peripheral artery disease or venous disease.  No Known Allergies  REVIEW OF SYSTEMS (Negative unless checked)  Constitutional: [] Weight loss  [] Fever  [] Chills Cardiac: [] Chest pain   [] Chest pressure   [] Palpitations   [x] Shortness of breath when laying flat   [x] Shortness of breath at rest   [x] Shortness of breath with exertion. Vascular:  [] Pain in legs with walking   [] Pain in legs at rest   [] Pain in legs when laying flat   [] Claudication   [] Pain in feet when walking  [] Pain in feet at rest  [] Pain in feet when laying flat    [] History of DVT   [] Phlebitis   [x] Swelling in legs   [] Varicose veins   [] Non-healing ulcers Pulmonary:   [] Uses home oxygen   [] Productive cough   [] Hemoptysis   [] Wheeze  [] COPD   [] Asthma Neurologic:  [] Dizziness  [] Blackouts   [] Seizures   [] History of stroke   [] History of TIA  [] Aphasia   [] Temporary blindness   [] Dysphagia   [] Weakness or numbness in arms   [] Weakness or numbness in legs Musculoskeletal:  [] Arthritis   [] Joint swelling   [] Joint pain   [] Low back pain Hematologic:  [] Easy bruising  [] Easy bleeding   [] Hypercoagulable state   [] Anemic  [] Hepatitis Gastrointestinal:  [] Blood in stool   [] Vomiting blood  [] Gastroesophageal reflux/heartburn   [] Difficulty swallowing. Genitourinary:  [x] Chronic kidney disease   [] Difficult urination  [] Frequent urination  [] Burning with urination   [] Blood in urine Skin:  [] Rashes   [] Ulcers   [] Wounds Psychological:  [] History of anxiety   []  History of major depression.  Physical Examination  Vitals:   06/01/21 0730 06/01/21 0900 06/01/21 0957 06/01/21 1030  BP: (!) 150/85 (!) 168/96 (!) 162/84 (!) 170/88  Pulse: 76 84  77  Resp: 12 18  17   Temp:      TempSrc:      SpO2: 100% 100%  99%  Weight:      Height:       Body mass index  is 30.24 kg/m. Gen:  WD/WN, NAD Head: [place]/AT, No temporalis wasting. Prominent temp pulse not noted. Ear/Nose/Throat: Hearing grossly intact, nares w/o erythema or drainage, oropharynx w/o Erythema/Exudate Eyes: Sclera non-icteric, conjunctiva clear Neck: Trachea midline.  No JVD.  Pulmonary:  Good air movement, respirations not labored, equal bilaterally.  Cardiac: RRR, normal S1, S2. Vascular:  Vessel Right Left  Radial Palpable Palpable  Ulnar Palpable Palpable   Gastrointestinal: soft, non-tender/non-distended. No guarding/reflex.  Musculoskeletal: M/S 5/5 throughout.  Extremities without ischemic changes.  No deformity or atrophy. No edema. Neurologic: Sensation grossly intact in extremities.   Symmetrical.  Speech is fluent. Motor exam as listed above. Psychiatric: Judgment intact, Mood & affect appropriate for pt's clinical situation. Dermatologic: No rashes or ulcers noted.  No cellulitis or open wounds. Lymph : No Cervical, Axillary, or Inguinal lymphadenopathy.  CBC Lab Results  Component Value Date   WBC 7.0 06/01/2021   HGB 7.5 (L) 06/01/2021   HCT 22.6 (L) 06/01/2021   MCV 89.3 06/01/2021   PLT 181 06/01/2021   BMET    Component Value Date/Time   NA 136 05/31/2021 2210   NA 141 12/21/2020 1023   K 5.3 (H) 05/31/2021 2210   CL 106 05/31/2021 2210   CO2 21 (L) 05/31/2021 2210   GLUCOSE 295 (H) 05/31/2021 2210   BUN 69 (H) 05/31/2021 2210   BUN 51 (H) 12/21/2020 1023   CREATININE 4.46 (H) 06/01/2021 0030   CALCIUM 9.0 05/31/2021 2210   GFRNONAA 15 (L) 06/01/2021 0030   GFRAA 30 (L) 03/06/2020 1255   Estimated Creatinine Clearance: 16 mL/min (A) (by C-G formula based on SCr of 4.46 mg/dL (H)).  COAG Lab Results  Component Value Date   INR 1.1 06/01/2021   Radiology DG Chest Portable 1 View  Result Date: 05/31/2021 CLINICAL DATA:  Respiratory distress and shortness of breath. EXAM: PORTABLE CHEST 1 VIEW COMPARISON:  PA Lat chest and chest CT both 12/23/2020 FINDINGS: Mild cardiomegaly is noted with increased central vascular distension and flow cephalization and diffuse increased septal lines consistent with interstitial edema with minimal pleural effusions beginning to develop. There is faint perihilar interstitial haziness which is probably due to ground-glass edema , pneumonitis possible but less likely. No focal consolidation is seen. There is thoracic spondylosis. IMPRESSION: Cardiomegaly with CHF pattern including generalized interstitial edema, perihilar haziness which is probably ground-glass edema and minimal pleural effusions forming. Clinical correlation and radiographic follow-up recommended. Electronically Signed   By: Telford Nab M.D.   On:  05/31/2021 22:13    Assessment/Plan Jimmy Olson is a 60 year old male with a past medical history of chronic apathy, chronic kidney disease, heart disease, diabetes mellitus type 2 who presented to the [facility] emergency department with a chief complaint of shortness of breath.   1.  Acute on chronic kidney disease: Patient with a known history of chronic kidney disease who presented to the [facility] emergency department complaining of shortness of breath.  He was evaluated by the nephrology service would like to initiate dialysis however he does not have an adequate access at this time.  Recommend placement of a PermCath so he can dialyze in the inpatient outpatient setting while formal work-up for a fistula versus a graft can be completed.  Procedure, risks and benefits were explained to the patient and his family was at the bedside.  All questions were answered.  We will plan on this tomorrow with Dr. Lucky Cowboy.  2.  Type 2  diabetes: On appropriate medications. Encouraged good control as its slows the progression of atherosclerotic disease.  3.  Hyperlipidemia: On aspirin and statin for medical management Encouraged good control as its slows the progression of atherosclerotic disease.  Discussed with Dr. Mayme Genta, PA-C 06/01/2021 11:54 AM  This note was created with Dragon medical transcription system.  Any error is purely unintentional.

## 2021-06-01 NOTE — ED Notes (Signed)
PT at bedside.

## 2021-06-01 NOTE — Progress Notes (Signed)
PROGRESS NOTE    Jimmy Olson  VFI:[id number] DOB: September 04, 1960 DOA: 05/31/2021 PCP: [facility]    Brief Narrative:   60 y.o. male seen in ed with complaints of shortness of breath that has been going on since earlier today.  Has progressively gotten worse throughout the day.  EMS was called and while attempting to provide supplemental oxygen patient did not tolerate the facemask at that point reported shortness of breath.  Patient denies any other complaints of headache nausea diarrhea fevers chills abdominal pain chest pain palpitations.   Assessment & Plan:   Principal Problem:   SOB (shortness of breath) Active Problems:   AKI (acute kidney injury) ([place])   Acute on chronic HFrEF (heart failure with reduced ejection fraction) (HCC)   Demand ischemia (HCC)   Anemia   Acute decompensated heart failure (HCC)  Acute on chronic systolic and diastolic congestive heart failure Acute hypoxic respiratory failure secondary to above Patient with known global heart failure Required BiPAP on admission Able to wean off of BiPAP 12/14 Plan: Continue IV Lasix Monitor kidney function and UOP Weaned off BiPAP, nasal cannula as needed Strict I's and O's Daily weights Nephrology consulted, will initiate hemodialysis during this admission  Hypertensive urgency/emergency Continue nitro drip, wean as tolerated Appreciate further cardiology recommendations Continue Coreg and Lasix  NSTEMI Uptrending troponins in the setting of decompensated heart failure On heparin gtt., will continue  Acute kidney injury on chronic kidney disease stage IV Patient known to the nephrology service I have explained the patient's kidney function has deteriorated to the point he will require hemodialysis Patient is agreeable Plan: Nephrology consultation Vascular consultation for dialysis access placement  Anemia of chronic kidney disease Hemoglobin stable Currently no  indication for transfusion   DVT prophylaxis: Heparin GTT Code Status: Full Family Communication: Attempted to call spouse [name] [phone] on 12/14.  No answer.  Voicemail not left. Disposition Plan: Status is: Inpatient  Remains inpatient appropriate because: Acute decompensated heart failure       Level of care: Progressive  Consultants:  Cardiology Nephrology Vascular surgery  Procedures:  None  Antimicrobials: None   Subjective: Seen and examined.  Resting in bed.  Shortness of breath improved.  History conducted in [place].  Endorses thirst  Objective: Vitals:   06/01/21 0730 06/01/21 0900 06/01/21 0957 06/01/21 1030  BP: (!) 150/85 (!) 168/96 (!) 162/84 (!) 170/88  Pulse: 76 84  77  Resp: 12 18  17   Temp:      TempSrc:      SpO2: 100% 100%  99%  Weight:      Height:        Intake/Output Summary (Last 24 hours) at 06/01/2021 1207 Last data filed at 06/01/2021 0753 Gross per 24 hour  Intake --  Output 1351 ml  Net -1351 ml   Filed Weights   05/31/21 2207  Weight: 75 kg    Examination:  General exam: No acute distress.  Fatigued Respiratory system: Scattered crackles bilaterally.  Normal work of breathing.  4L Cardiovascular system: S1-S2, RRR, no murmurs, 1+ pitting edema Gastrointestinal system: Soft, NT/ND, normal bowel sounds Central nervous system: Alert and oriented. No focal neurological deficits. Extremities: Symmetric 5 x 5 power. Skin: No rashes, lesions or ulcers Psychiatry: Judgement and insight appear normal. Mood & affect appropriate.     Data Reviewed: I have personally reviewed following labs and imaging studies  CBC: Recent Labs  Lab 05/31/21 2210 06/01/21 0204 06/01/21 0445  WBC  8.9 8.2 7.0  NEUTROABS 4.9  --   --   HGB 9.5* 8.0* 7.5*  HCT 28.6* 24.1* 22.6*  MCV 91.4 90.3 89.3  PLT 231 183 197   Basic Metabolic Panel: Recent Labs  Lab 05/31/21 2210 06/01/21 0026 06/01/21 0030  NA 136  --   --   K 5.3*   --   --   CL 106  --   --   CO2 21*  --   --   GLUCOSE 295*  --   --   BUN 69*  --   --   CREATININE 4.61*  --  4.46*  CALCIUM 9.0  --   --   MG  --  1.9  --    GFR: Estimated Creatinine Clearance: 16 mL/min (A) (by C-G formula based on SCr of 4.46 mg/dL (H)). Liver Function Tests: No results for input(s): AST, ALT, ALKPHOS, BILITOT, PROT, ALBUMIN in the last 168 hours. No results for input(s): LIPASE, AMYLASE in the last 168 hours. No results for input(s): AMMONIA in the last 168 hours. Coagulation Profile: Recent Labs  Lab 06/01/21 0204  INR 1.1   Cardiac Enzymes: No results for input(s): CKTOTAL, CKMB, CKMBINDEX, TROPONINI in the last 168 hours. BNP (last 3 results) No results for input(s): PROBNP in the last 8760 hours. HbA1C: No results for input(s): HGBA1C in the last 72 hours. CBG: Recent Labs  Lab 05/31/21 2211 06/01/21 0743 06/01/21 1123  GLUCAP 310* 261* 70   Lipid Profile: No results for input(s): CHOL, HDL, LDLCALC, TRIG, CHOLHDL, LDLDIRECT in the last 72 hours. Thyroid Function Tests: No results for input(s): TSH, T4TOTAL, FREET4, T3FREE, THYROIDAB in the last 72 hours. Anemia Panel: No results for input(s): VITAMINB12, FOLATE, FERRITIN, TIBC, IRON, RETICCTPCT in the last 72 hours. Sepsis Labs: No results for input(s): PROCALCITON, LATICACIDVEN in the last 168 hours.  Recent Results (from the past 240 hour(s))  Resp Panel by RT-PCR (Flu A&B, Covid) Nasopharyngeal Swab     Status: None   Collection Time: 05/31/21 10:10 PM   Specimen: Nasopharyngeal Swab; Nasopharyngeal(NP) swabs in vial transport medium  Result Value Ref Range Status   SARS Coronavirus 2 by RT PCR NEGATIVE NEGATIVE Final    Comment: (NOTE) SARS-CoV-2 target nucleic acids are NOT DETECTED.  The SARS-CoV-2 RNA is generally detectable in upper respiratory specimens during the acute phase of infection. The lowest concentration of SARS-CoV-2 viral copies this assay can detect is 138  copies/mL. A negative result does not preclude SARS-Cov-2 infection and should not be used as the sole basis for treatment or other patient management decisions. A negative result may occur with  improper specimen collection/handling, submission of specimen other than nasopharyngeal swab, presence of viral mutation(s) within the areas targeted by this assay, and inadequate number of viral copies(<138 copies/mL). A negative result must be combined with clinical observations, patient history, and epidemiological information. The expected result is Negative.  Fact Sheet for Patients:  EntrepreneurPulse.com.au  Fact Sheet for Healthcare Providers:  IncredibleEmployment.be  This test is no t yet approved or cleared by the [place] FDA and  has been authorized for detection and/or diagnosis of SARS-CoV-2 by FDA under an Emergency Use Authorization (EUA). This EUA will remain  in effect (meaning this test can be used) for the duration of the COVID-19 declaration under Section 564(b)(1) of the Act, 21 U.S.C.section 360bbb-3(b)(1), unless the authorization is terminated  or revoked sooner.       Influenza A by PCR NEGATIVE NEGATIVE  Final   Influenza B by PCR NEGATIVE NEGATIVE Final    Comment: (NOTE) The Xpert Xpress SARS-CoV-2/FLU/RSV plus assay is intended as an aid in the diagnosis of influenza from Nasopharyngeal swab specimens and should not be used as a sole basis for treatment. Nasal washings and aspirates are unacceptable for Xpert Xpress SARS-CoV-2/FLU/RSV testing.  Fact Sheet for Patients: EntrepreneurPulse.com.au  Fact Sheet for Healthcare Providers: IncredibleEmployment.be  This test is not yet approved or cleared by the [place] FDA and has been authorized for detection and/or diagnosis of SARS-CoV-2 by FDA under an Emergency Use Authorization (EUA). This EUA will remain in effect (meaning  this test can be used) for the duration of the COVID-19 declaration under Section 564(b)(1) of the Act, 21 U.S.C. section 360bbb-3(b)(1), unless the authorization is terminated or revoked.  Performed at [name], [address], [place], [place] [address]          Radiology Studies: DG Chest Portable 1 View  Result Date: 05/31/2021 CLINICAL DATA:  Respiratory distress and shortness of breath. EXAM: PORTABLE CHEST 1 VIEW COMPARISON:  PA Lat chest and chest CT both 12/23/2020 FINDINGS: Mild cardiomegaly is noted with increased central vascular distension and flow cephalization and diffuse increased septal lines consistent with interstitial edema with minimal pleural effusions beginning to develop. There is faint perihilar interstitial haziness which is probably due to ground-glass edema , pneumonitis possible but less likely. No focal consolidation is seen. There is thoracic spondylosis. IMPRESSION: Cardiomegaly with CHF pattern including generalized interstitial edema, perihilar haziness which is probably ground-glass edema and minimal pleural effusions forming. Clinical correlation and radiographic follow-up recommended. Electronically Signed   By: Telford Nab M.D.   On: 05/31/2021 22:13        Scheduled Meds:  aspirin EC  81 mg Oral Daily   carvedilol  6.25 mg Oral BID   docusate sodium  100 mg Oral BID   furosemide  40 mg Intravenous BID   hydrALAZINE  50 mg Oral BID   insulin aspart  0-15 Units Subcutaneous TID WC   nicotine  14 mg Transdermal Daily   sodium chloride flush  3 mL Intravenous Q12H   Continuous Infusions:  heparin 900 Units/hr (06/01/21 1057)   nitroGLYCERIN 5 mcg/min (06/01/21 1027)     LOS: 0 days    Time spent: 35 minutes    Sidney Ace, MD [facility]   If 7PM-7AM, please contact night-coverage  06/01/2021, 12:07 PM

## 2021-06-01 NOTE — Progress Notes (Signed)
[facility]  ROUNDING NOTE   Subjective:   Jimmy Olson is a 60 year old male with a past medical history consisting of coronary artery disease, diabetes, hypertension, chronic kidney disease stage IV.  He presents to the emergency department with complaints of shortness of breath.  He will be admitted for SOB (shortness of breath) [R06.02] Acute decompensated heart failure ([place]) [I50.9]   Patient is known to our office and is followed by Dr. Candiss Norse.  Patient was last seen in the office on November 10 of this year.  It was discussed with patient at that time the possibility of progression to end-stage renal disease.  Patient states he felt fine last night before bed.  He woke up this morning with shortness of breath, states he was unable to feel his diuretic prescription, but unable to state how long he has been without.  Denies nausea, vomiting, and diarrhea.  Denies loss of appetite and chest pain.  He was placed on BiPAP on ED arrival and given time dose of Lasix IV 60 mg with little urinary output.  Labs on arrival include sodium 136, potassium 5.3, glucose 295, creatinine 4.61 with GFR 14, elevated troponin and BNP greater than 1800.  Chest x-ray shows cardiomegaly with CHF pattern and probable groundglass edema with minimal pleural effusions.  We have been consulted to evaluate acute kidney injury versus end-stage renal disease.  Objective:  Vital signs in last 24 hours:  Temp:  [97.6 F (36.4 C)] 97.6 F (36.4 C) (12/13 2206) Pulse Rate:  [76-100] 77 (12/14 1030) Resp:  [12-25] 17 (12/14 1030) BP: (139-237)/(76-130) 170/88 (12/14 1030) SpO2:  [98 %-100 %] 99 % (12/14 1030) FiO2 (%):  [36 %] 36 % (12/14 0430) Weight:  [75 kg] 75 kg (12/13 2207)  Weight change:  Filed Weights   05/31/21 2207  Weight: 75 kg    Intake/Output: I/O last 3 completed shifts: In: -  Out: 851 [Urine:850; Stool:1]   Intake/Output this shift:  Total I/O In: -  Out: 500  [Urine:500]  Physical Exam: General: NAD, resting in bed  Head: Normocephalic, atraumatic. Moist oral mucosal membranes  Eyes: Anicteric  Lungs:  Coarse/[place] bilaterally, mild tachypnea, 3 L [place]  Heart: Regular rate and rhythm  Abdomen:  Soft, nontender  Extremities:  no peripheral edema.  Neurologic: Nonfocal, moving all four extremities  Skin: No lesions  Access: No access    Basic Metabolic Panel: Recent Labs  Lab 05/31/21 2210 06/01/21 0026 06/01/21 0030  NA 136  --   --   K 5.3*  --   --   CL 106  --   --   CO2 21*  --   --   GLUCOSE 295*  --   --   BUN 69*  --   --   CREATININE 4.61*  --  4.46*  CALCIUM 9.0  --   --   MG  --  1.9  --     Liver Function Tests: No results for input(s): AST, ALT, ALKPHOS, BILITOT, PROT, ALBUMIN in the last 168 hours. No results for input(s): LIPASE, AMYLASE in the last 168 hours. No results for input(s): AMMONIA in the last 168 hours.  CBC: Recent Labs  Lab 05/31/21 2210 06/01/21 0204 06/01/21 0445  WBC 8.9 8.2 7.0  NEUTROABS 4.9  --   --   HGB 9.5* 8.0* 7.5*  HCT 28.6* 24.1* 22.6*  MCV 91.4 90.3 89.3  PLT 231 183 181    Cardiac Enzymes: No results  for input(s): CKTOTAL, CKMB, CKMBINDEX, TROPONINI in the last 168 hours.  BNP: Invalid input(s): POCBNP  CBG: Recent Labs  Lab 05/31/21 2211 06/01/21 0743 06/01/21 1123  GLUCAP 310* 261* 57    Microbiology: Results for orders placed or performed during the hospital encounter of 05/31/21  Resp Panel by RT-PCR (Flu A&B, Covid) Nasopharyngeal Swab     Status: None   Collection Time: 05/31/21 10:10 PM   Specimen: Nasopharyngeal Swab; Nasopharyngeal(NP) swabs in vial transport medium  Result Value Ref Range Status   SARS Coronavirus 2 by RT PCR NEGATIVE NEGATIVE Final    Comment: (NOTE) SARS-CoV-2 target nucleic acids are NOT DETECTED.  The SARS-CoV-2 RNA is generally detectable in upper respiratory specimens during the acute phase of infection. The  lowest concentration of SARS-CoV-2 viral copies this assay can detect is 138 copies/mL. A negative result does not preclude SARS-Cov-2 infection and should not be used as the sole basis for treatment or other patient management decisions. A negative result may occur with  improper specimen collection/handling, submission of specimen other than nasopharyngeal swab, presence of viral mutation(s) within the areas targeted by this assay, and inadequate number of viral copies(<138 copies/mL). A negative result must be combined with clinical observations, patient history, and epidemiological information. The expected result is Negative.  Fact Sheet for Patients:  EntrepreneurPulse.com.au  Fact Sheet for Healthcare Providers:  IncredibleEmployment.be  This test is no t yet approved or cleared by the [place] FDA and  has been authorized for detection and/or diagnosis of SARS-CoV-2 by FDA under an Emergency Use Authorization (EUA). This EUA will remain  in effect (meaning this test can be used) for the duration of the COVID-19 declaration under Section 564(b)(1) of the Act, 21 U.S.C.section 360bbb-3(b)(1), unless the authorization is terminated  or revoked sooner.       Influenza A by PCR NEGATIVE NEGATIVE Final   Influenza B by PCR NEGATIVE NEGATIVE Final    Comment: (NOTE) The Xpert Xpress SARS-CoV-2/FLU/RSV plus assay is intended as an aid in the diagnosis of influenza from Nasopharyngeal swab specimens and should not be used as a sole basis for treatment. Nasal washings and aspirates are unacceptable for Xpert Xpress SARS-CoV-2/FLU/RSV testing.  Fact Sheet for Patients: EntrepreneurPulse.com.au  Fact Sheet for Healthcare Providers: IncredibleEmployment.be  This test is not yet approved or cleared by the [place] FDA and has been authorized for detection and/or diagnosis of SARS-CoV-2 by FDA under  an Emergency Use Authorization (EUA). This EUA will remain in effect (meaning this test can be used) for the duration of the COVID-19 declaration under Section 564(b)(1) of the Act, 21 U.S.C. section 360bbb-3(b)(1), unless the authorization is terminated or revoked.  Performed at [facility], [address] [place],  [address]     Coagulation Studies: Recent Labs    06/01/21 0204  LABPROT 13.7  INR 1.1    Urinalysis: No results for input(s): COLORURINE, LABSPEC, PHURINE, GLUCOSEU, HGBUR, BILIRUBINUR, KETONESUR, PROTEINUR, UROBILINOGEN, NITRITE, LEUKOCYTESUR in the last 72 hours.  Invalid input(s): APPERANCEUR    Imaging: DG Chest Portable 1 View  Result Date: 05/31/2021 CLINICAL DATA:  Respiratory distress and shortness of breath. EXAM: PORTABLE CHEST 1 VIEW COMPARISON:  PA Lat chest and chest CT both 12/23/2020 FINDINGS: Mild cardiomegaly is noted with increased central vascular distension and flow cephalization and diffuse increased septal lines consistent with interstitial edema with minimal pleural effusions beginning to develop. There is faint perihilar interstitial haziness which is probably due to ground-glass edema ,  pneumonitis possible but less likely. No focal consolidation is seen. There is thoracic spondylosis. IMPRESSION: Cardiomegaly with CHF pattern including generalized interstitial edema, perihilar haziness which is probably ground-glass edema and minimal pleural effusions forming. Clinical correlation and radiographic follow-up recommended. Electronically Signed   By: Telford Nab M.D.   On: 05/31/2021 22:13     Medications:    heparin 900 Units/hr (06/01/21 1057)   nitroGLYCERIN 5 mcg/min (06/01/21 1027)    aspirin EC  81 mg Oral Daily   carvedilol  6.25 mg Oral BID   docusate sodium  100 mg Oral BID   furosemide  40 mg Intravenous BID   heparin  1,000 Units Intravenous Once   hydrALAZINE  50 mg Oral BID   insulin aspart  0-15 Units  Subcutaneous TID WC   nicotine  14 mg Transdermal Daily   sodium chloride flush  3 mL Intravenous Q12H   acetaminophen **OR** acetaminophen, albuterol, hydrALAZINE, morphine injection, ondansetron **OR** ondansetron (ZOFRAN) IV, oxyCODONE, polyethylene glycol  Assessment/ Plan:  Mr. Jimmy Olson is a 60 y.o.  male ith a past medical history consisting of coronary artery disease, diabetes, hypertension, chronic kidney disease stage IV.  He presents to the emergency department with complaints of shortness of breath.  He will be admitted for SOB (shortness of breath) [R06.02] Acute decompensated heart failure (HCC) [I50.9]   End-stage renal disease requiring hemodialysis.  Based on chart review and recent office note.  We feel patient kidney disease has progressed to stage V requiring initiation of renal replacement therapy.  We have discussed this with patient and he is agreeable and prefers in center treatments at this time.  Consulted vascular for PermCath placement tomorrow.  We will order vein mapping for future fistula placement.  Plan to initiate dialysis once PermCath placed.  Dialysis coordinator aware of patient sent outpatient placement pending.  2. Anemia of chronic kidney disease Lab Results  Component Value Date   HGB 7.5 (L) 06/01/2021    Hemoglobin below target.  We will consider EPO with initiation of dialysis.  3. Secondary Hyperparathyroidism:  Lab Results  Component Value Date   CALCIUM 9.0 05/31/2021   PHOS 4.3 07/18/2020  Calcium and phosphorus within at desired goal at this time.  4. Diabetes mellitus type II with chronic kidney disease  insulin dependent. Home regimen includes NovoLog and Levemir. Most recent hemoglobin A1c is 10.3 on 12/24/20.  glucose elevated yesterday.  Back within range today  5.  Hypertension with chronic kidney disease.  Home regimen includes amlodipine, carvedilol, isosorbide, hydralazine and torsemide.  Currently receiving carvedilol,  furosemide, and hydralazine.  Current blood pressure 170/88.  6.  Chronic systolic heart failure.  EF from July 16, 2020 shows EF 30 to 35% with mild LVH and a grade 2 diastolic dysfunction.   LOS: 0   12/14/202212:20 PM

## 2021-06-01 NOTE — ED Notes (Signed)
Pt did well with PT. Pt now on 3L [place] at this time and has maintained 100% during the whole session.

## 2021-06-01 NOTE — Progress Notes (Signed)
ANTICOAGULATION CONSULT NOTE  Pharmacy Consult for heparin infusion Indication: NSTEMI  Patient Measurements: Height: 5\' 2"  (157.5 cm) Weight: 75 kg (165 lb 5.5 oz) IBW/kg (Calculated) : 54.6 Heparin Dosing Weight: 70.3 kg  Labs: Recent Labs    05/31/21 2210 05/31/21 2348 06/01/21 0030 06/01/21 0204 06/01/21 0445 06/01/21 0746 06/01/21 1002 06/01/21 1118 06/01/21 2108  HGB 9.5*  --   --  8.0* 7.5*  --   --   --   --   HCT 28.6*  --   --  24.1* 22.6*  --   --   --   --   PLT 231  --   --  183 181  --   --   --   --   APTT  --   --   --  25  --   --   --   --   --   LABPROT  --   --   --  13.7  --   --   --   --   --   INR  --   --   --  1.1  --   --   --   --   --   HEPARINUNFRC  --   --   --   --   --   --   --  0.20* 0.37  CREATININE 4.61*  --  4.46*  --   --   --   --   --   --   TROPONINIHS 241*   < >  --  1,372* 2,862* 3,611* 4,844*  --   --    < > = values in this interval not displayed.     Estimated Creatinine Clearance: 16 mL/min (A) (by C-G formula based on SCr of 4.46 mg/dL (H)).   Medical History: Past Medical History:  Diagnosis Date   Chronic HFrEF (heart failure with reduced ejection fraction) ([place])    a. 06/2020 Echo: EF 30-35%, glob HK, GrII DD, nl RV fxn. Mild LAE.   Chronic kidney disease (CKD), stage IV (severe) ([place])    COVID-19 virus infection 02/2020   Diabetes mellitus without complication (HCC)    Hypertension    NICM (nonischemic cardiomyopathy) ([place])    a. 07/2009 MV: EF 48%; b. 08/2019 Echo: EF 45-50%. Global HK. Mod LVH. GrIDD; c. 06/2020 Echo: EF 30-35%, glob HK, mild LVH, GrII DD, nl RV fxn, mild LAE; d. 06/2020 Cath: nonobs dzs.   Nonobstructive CAD (coronary artery disease)    a. 07/2009 MV: EF 48%. No ischemia; b. 06/2020 Cath: LM min irregs, LAD 40p, 4m, D1 70, D2 min irregs, D3 nl, RI min irregs, LCX small, mild diff dzs, RCA 20p, 51m, 50d, RPAV 50-->Med Rx.    Assessment: Pt is 60 yo male with h/o CKD, DM Type 2, Heart Disease  presenting to ED c/o SOB, found with slightly elevated Troponin I, trending up. Pharmacy has been consulted for heparin dosing/monitoring.  Goal of Therapy:  Heparin level 0.3-0.7 units/ml Monitor platelets by anticoagulation protocol: Yes   Plan:  Heparin level is therapeutic x 1 Maintain heparin infusion at 1050 units/hr Recheck confirmatory HL in 8 hrs CBC daily while on heparin.  Benita Gutter 06/01/2021 9:39 PM

## 2021-06-01 NOTE — Hospital Course (Signed)
12/8 neph - Patient reports no significant change in health status since we last saw him. His appetite is same. He has lost 5 pounds since his last visit. He continues to have some lower extremity edema, about 1+ in both legs. He has received kidney disease education by phone 71.8 kg (158 lb 3.2 oz)

## 2021-06-01 NOTE — ED Notes (Addendum)
Dr Tobie Poet notified of this pt's assesment/ lab reports .  Notable significant increase of trop levels . Pt on heparin and NTG drip at this time. Repeat EKG was  done  and  was showed to ERMD.  Pt reports CP pain score 4/10.  Pt noted w/ Breathing even-unlabored and appears comfortable on BiPAP at this time. Will continue to monitor this Pt.  Received call from Dr Tobie Poet @0602 ,  advise to continue treatments as is

## 2021-06-01 NOTE — Evaluation (Signed)
Physical Therapy Evaluation Patient Details Name: Jimmy Olson MRN: [id number] DOB: [birth year] Today's Date: 06/01/2021  History of Present Illness  60 y.o. male with complaints of shortness of breath, apparently he can typically self-manage with inhaler but that has run out.  Has progressively gotten worse and required O2 here in the ED.  Clinical Impression  Pt laying in bed on arrival but relatively quick to wake and participate.  Some issues with tele interpretor with lost connection, better luck on second call.  Pt did well with mobility and did not need assist to get to sitting EOB, however he is not near his baseline regarding ambulation as he typically only rarely uses cane and today clearly needed RW.  Pt on 6L on arrival with sats 100%, dropped down to 4 then 3L during activity with sats remaining in the high 90s.  Pt with no LOBs and showed mobility to safely go home, recommending HHPT if possible as he is not at his baseline.     Recommendations for follow up therapy are one component of a multi-disciplinary discharge planning process, led by the attending physician.  Recommendations may be updated based on patient status, additional functional criteria and insurance authorization.  Follow Up Recommendations Home health PT    Assistance Recommended at Discharge Intermittent Supervision/Assistance  Functional Status Assessment Patient has had a recent decline in their functional status and demonstrates the ability to make significant improvements in function in a reasonable and predictable amount of time.  Equipment Recommendations  None recommended by PT    Recommendations for Other Services       Precautions / Restrictions Precautions Precautions: Fall Restrictions Weight Bearing Restrictions: No      Mobility  Bed Mobility Overal bed mobility: Modified Independent             General bed mobility comments: Able to get to sitting EOB w/o assist, no need for hand  rails    Transfers Overall transfer level: Needs assistance Equipment used: Rolling walker (2 wheels) Transfers: Sit to/from Stand Sit to Stand: Min guard           General transfer comment: Pt initially with some difficulty shifting weight forward in the walker, but did ultimately rise w/o direct assist from elevated surface    Ambulation/Gait Ambulation/Gait assistance: Min guard Gait Distance (Feet): 75 Feet Assistive device: Rolling walker (2 wheels)         General Gait Details: Pt initially requests to use just a cane, however weakness and low back pain necessitated significant use of UEs on walker and thus we used it t/o the ambulation effort.  Pt with no LOBs but did endorse fatigue; his O2 sats remained in the high 90s, initially on 6L and tapering down to 3L with maintainance of good sats.  Stairs            Wheelchair Mobility    Modified Rankin (Stroke Patients Only)       Balance Overall balance assessment: Needs assistance Sitting-balance support: Bilateral upper extremity supported Sitting balance-Leahy Scale: Good       Standing balance-Leahy Scale: Fair Standing balance comment: clearly reliant on the walker, no buckling or staggering                             Pertinent Vitals/Pain Pain Assessment: 0-10 Pain Score: 4  Pain Location: chronic low back pain    Home Living Family/patient expects to be discharged  to:: Private residence Living Arrangements: Spouse/significant other;Children     Home Access: Stairs to enter Entrance Stairs-Rails: Chemical engineer of Steps: 4   Home Layout: One level Home Equipment: Conservation officer, nature (2 wheels) (BSC vs rails/riser?)      Prior Function Prior Level of Function : Needs assist             Mobility Comments: Pt reports that he is able to go out during brief errands, etc but does not do any prolonged walking/activity.  Will occasionally use SPC, but typically  uses no ADs.       Hand Dominance        Extremity/Trunk Assessment   Upper Extremity Assessment Upper Extremity Assessment: Overall WFL for tasks assessed;Generalized weakness    Lower Extremity Assessment Lower Extremity Assessment: Overall WFL for tasks assessed;Generalized weakness       Communication   Communication: Prefers language other than English (Tele-interp Freida Busman # [phone])  Cognition Arousal/Alertness: Awake/alert Behavior During Therapy: WFL for tasks assessed/performed Overall Cognitive Status: Within Functional Limits for tasks assessed                                          General Comments      Exercises     Assessment/Plan    PT Assessment Patient needs continued PT services  PT Problem List Decreased strength;Decreased activity tolerance;Decreased balance;Decreased mobility;Decreased knowledge of use of DME;Decreased safety awareness;Cardiopulmonary status limiting activity       PT Treatment Interventions DME instruction;Gait training;Stair training;Functional mobility training;Therapeutic activities;Therapeutic exercise;Balance training;Neuromuscular re-education;Patient/family education    PT Goals (Current goals can be found in the Care Plan section)  Acute Rehab PT Goals Patient Stated Goal: go home PT Goal Formulation: With patient Time For Goal Achievement: 06/15/21 Potential to Achieve Goals: Fair    Frequency Min 2X/week   Barriers to discharge        Co-evaluation               AM-PAC PT "6 Clicks" Mobility  Outcome Measure Help needed turning from your back to your side while in a flat bed without using bedrails?: None Help needed moving from lying on your back to sitting on the side of a flat bed without using bedrails?: None Help needed moving to and from a bed to a chair (including a wheelchair)?: A Little Help needed standing up from a chair using your arms (e.g., wheelchair or bedside chair)?: A  Little Help needed to walk in hospital room?: A Little Help needed climbing 3-5 steps with a railing? : A Little 6 Click Score: 20    End of Session Equipment Utilized During Treatment: Gait belt;Oxygen (6-3L) Activity Tolerance: Patient limited by fatigue;Patient tolerated treatment well Patient left: in bed;with call bell/phone within reach Nurse Communication: Mobility status;Other (comment) (O2 during activity) PT Visit Diagnosis: Muscle weakness (generalized) (M62.81);Difficulty in walking, not elsewhere classified (R26.2)    Time: 3729-0211 PT Time Calculation (min) (ACUTE ONLY): 40 min   Charges:   PT Evaluation $PT Eval Low Complexity: 1 Low PT Treatments $Gait Training: 8-22 mins        Kreg Shropshire, DPT 06/01/2021, 12:03 PM

## 2021-06-01 NOTE — ED Notes (Signed)
Per pt's MD request pt placed on 6L [place] at this time.

## 2021-06-01 NOTE — Progress Notes (Signed)
ANTICOAGULATION CONSULT NOTE  Pharmacy Consult for heparin infusion Indication: NSTEMI  No Known Allergies  Patient Measurements: Height: 5\' 2"  (157.5 cm) Weight: 75 kg (165 lb 5.5 oz) IBW/kg (Calculated) : 54.6 Heparin Dosing Weight: 70.3 kg  Vital Signs: BP: 170/88 (12/14 1030) Pulse Rate: 77 (12/14 1030)  Labs: Recent Labs    05/31/21 2210 05/31/21 2348 06/01/21 0030 06/01/21 0204 06/01/21 0445 06/01/21 0746 06/01/21 1002 06/01/21 1118  HGB 9.5*  --   --  8.0* 7.5*  --   --   --   HCT 28.6*  --   --  24.1* 22.6*  --   --   --   PLT 231  --   --  183 181  --   --   --   APTT  --   --   --  25  --   --   --   --   LABPROT  --   --   --  13.7  --   --   --   --   INR  --   --   --  1.1  --   --   --   --   HEPARINUNFRC  --   --   --   --   --   --   --  0.20*  CREATININE 4.61*  --  4.46*  --   --   --   --   --   TROPONINIHS 241*   < >  --  1,372* 2,862* 3,611* 4,844*  --    < > = values in this interval not displayed.     Estimated Creatinine Clearance: 16 mL/min (A) (by C-G formula based on SCr of 4.46 mg/dL (H)).   Medical History: Past Medical History:  Diagnosis Date   Chronic HFrEF (heart failure with reduced ejection fraction) ([place])    a. 06/2020 Echo: EF 30-35%, glob HK, GrII DD, nl RV fxn. Mild LAE.   Chronic kidney disease (CKD), stage IV (severe) ([place])    COVID-19 virus infection 02/2020   Diabetes mellitus without complication (HCC)    Hypertension    NICM (nonischemic cardiomyopathy) ([place])    a. 07/2009 MV: EF 48%; b. 08/2019 Echo: EF 45-50%. Global HK. Mod LVH. GrIDD; c. 06/2020 Echo: EF 30-35%, glob HK, mild LVH, GrII DD, nl RV fxn, mild LAE; d. 06/2020 Cath: nonobs dzs.   Nonobstructive CAD (coronary artery disease)    a. 07/2009 MV: EF 48%. No ischemia; b. 06/2020 Cath: LM min irregs, LAD 40p, 13m, D1 70, D2 min irregs, D3 nl, RI min irregs, LCX small, mild diff dzs, RCA 20p, 99m, 50d, RPAV 50-->Med Rx.    Assessment: Pt is 60 yo male with h/o  CKD, DM Type 2, Heart Disease presenting to ED c/o SOB, found with slightly elevated Troponin I, trending up. Pharmacy has been consulted for heparin dosing/monitoring.  12/14 1118 HL 0.20, subtherapeutic  Goal of Therapy:  Heparin level 0.3-0.7 units/ml Monitor platelets by anticoagulation protocol: Yes   Plan:  HL subtherapeutic. Will give 1000 unit bolus and increase heparin infusion rate to 1050 units/hr Recheck HL in 8 hr after rate change CBC daily while on heparin.  Sherilyn Banker, PharmD Clinical Pharmacist  06/01/2021 12:16 PM

## 2021-06-01 NOTE — Progress Notes (Signed)
ANTICOAGULATION CONSULT NOTE  Pharmacy Consult for heparin infusion Indication: NSTEMI  No Known Allergies  Patient Measurements: Height: 5\' 2"  (157.5 cm) Weight: 75 kg (165 lb 5.5 oz) IBW/kg (Calculated) : 54.6 Heparin Dosing Weight: 70.3 kg  Vital Signs: Temp: 97.6 F (36.4 C) (12/13 2206) Temp Source: Oral (12/13 2206) BP: 169/98 (12/14 0100) Pulse Rate: 78 (12/14 0100)  Labs: Recent Labs    05/31/21 2210 05/31/21 2348 06/01/21 0030  HGB 9.5*  --   --   HCT 28.6*  --   --   PLT 231  --   --   CREATININE 4.61*  --  4.46*  TROPONINIHS 241* 370*  --     Estimated Creatinine Clearance: 16 mL/min (A) (by C-G formula based on SCr of 4.46 mg/dL (H)).   Medical History: Past Medical History:  Diagnosis Date   Cardiomyopathy ([place])    a. 07/2009 MV: EF 48%; b. 08/2019 Echo: EF 45-50%. Global HK. Mod LVH. Gr1 DD. Nl RV fxn. Nl PASP.   Chest pain    a. 07/2009 MV: EF 48%. No ischemia.   Chronic kidney disease (CKD), stage IV (severe) (HCC)    Coronary artery disease    Diabetes mellitus without complication (HCC)    HFmrEF (heart failure with mildy reduced ejection fraction) ([place])    Hypertension     Assessment: Pt is 60 yo male with h/o CKD, DM Type 2, Heart Disease presenting to ED c/o SOB, found with slightly elevated Troponin I, trending up.  Goal of Therapy:  Heparin level 0.3-0.7 units/ml Monitor platelets by anticoagulation protocol: Yes   Plan:  Bolus 4000 units x 1 Start heparin infusion at 900 units/hr Based on CrCl, will check HL in 8 hr after start of infusion CBC daily while on heparin.  Renda Rolls, PharmD, MBA 06/01/2021 1:51 AM

## 2021-06-01 NOTE — Progress Notes (Signed)
Inpatient Diabetes Program Recommendations  AACE/ADA: New Consensus Statement on Inpatient Glycemic Control   Target Ranges:  Prepandial:   less than 140 mg/dL      Peak postprandial:   less than 180 mg/dL (1-2 hours)      Critically ill patients:  140 - 180 mg/dL    Latest Reference Range & Units 06/01/21 07:43  Glucose-Capillary 70 - 99 mg/dL 261 (H)    Latest Reference Range & Units 05/31/21 22:11  Glucose-Capillary 70 - 99 mg/dL 310 (H)   Review of Glycemic Control  Diabetes history: DM2 Outpatient Diabetes medications: Levemir 20 units QHS, Humalog 3 units TID with meals Current orders for Inpatient glycemic control: Novolog 0-15 units TID  Inpatient Diabetes Program Recommendations:    Insulin: If glucose is consistently over 180 mg/dl, please consider ordering Semglee 10 units Q24H.  Thanks Barnie Alderman, RN, MSN, CDE Diabetes Coordinator Inpatient Diabetes Program [phone] (Team Pager from 8am to 5pm)

## 2021-06-01 NOTE — Progress Notes (Signed)
[facility] Vascular Consult Note  MRN : [id number]  Jimmy Olson is a 60 y.o. (1960-12-03) male who presents with chief complaint of  Chief Complaint  Patient presents with   Shortness of Breath   History of Present Illness:  Jimmy Olson is a 60 year old male with a past medical history of chronic apathy, chronic kidney disease, heart disease, diabetes mellitus type 2 who presented to the [facility] emergency department with a chief complaint of shortness of breath.   The patient endorses a history of progressively worsening shortness of breath over the approximately one day. EMS was called and while attempting to provide supplemental oxygen the patient did not tolerate the facemask. Patient denies any other complaints of headache nausea diarrhea fevers chills abdominal pain chest pain palpitations.   Patient has a known history of chronic kidney disease.  He was evaluated by the nephrology service and they would like to initiate dialysis.  At this time, the patient does not have an adequate dialysis access and we were asked by Dr. Candiss Norse to place a PermCath.  Current Facility-Administered Medications  Medication Dose Route Frequency Provider Last Rate Last Admin   acetaminophen (TYLENOL) tablet 650 mg  650 mg Oral Q6H PRN Para Skeans, MD       Or   acetaminophen (TYLENOL) suppository 650 mg  650 mg Rectal Q6H PRN Para Skeans, MD       albuterol (PROVENTIL) (2.5 MG/3ML) 0.083% nebulizer solution 2.5 mg  2.5 mg Nebulization Q4H PRN Renda Rolls, RPH       aspirin EC tablet 81 mg  81 mg Oral Daily Florina Ou V, MD   81 mg at 06/01/21 0957   carvedilol (COREG) tablet 6.25 mg  6.25 mg Oral BID Florina Ou V, MD   6.25 mg at 06/01/21 1030   docusate sodium (COLACE) capsule 100 mg  100 mg Oral BID Florina Ou V, MD   100 mg at 06/01/21 0957   furosemide (LASIX) injection 40 mg  40 mg Intravenous BID Para Skeans, MD       heparin ADULT  infusion 100 units/mL (25000 units/253mL)  900 Units/hr Intravenous Continuous Renda Rolls, RPH 9 mL/hr at 06/01/21 1057 900 Units/hr at 06/01/21 1057   hydrALAZINE (APRESOLINE) injection 5 mg  5 mg Intravenous Q4H PRN Para Skeans, MD   5 mg at 06/01/21 0251   hydrALAZINE (APRESOLINE) tablet 50 mg  50 mg Oral BID Para Skeans, MD   50 mg at 06/01/21 0957   insulin aspart (novoLOG) injection 0-15 Units  0-15 Units Subcutaneous TID WC Florina Ou V, MD   0 Units at 06/01/21 1125   morphine 2 MG/ML injection 2 mg  2 mg Intravenous Q2H PRN Para Skeans, MD   2 mg at 06/01/21 1829   nicotine (NICODERM CQ - dosed in mg/24 hours) patch 14 mg  14 mg Transdermal Daily Florina Ou V, MD   14 mg at 06/01/21 0957   nitroGLYCERIN 50 mg in dextrose 5 % 250 mL (0.2 mg/mL) infusion  0-200 mcg/min Intravenous Continuous Nance Pear, MD 1.5 mL/hr at 06/01/21 1027 5 mcg/min at 06/01/21 1027   ondansetron (ZOFRAN) tablet 4 mg  4 mg Oral Q6H PRN Para Skeans, MD       Or   ondansetron (ZOFRAN) injection 4 mg  4 mg Intravenous Q6H PRN Para Skeans, MD       oxyCODONE (Oxy  IR/ROXICODONE) immediate release tablet 5 mg  5 mg Oral Q4H PRN Para Skeans, MD       polyethylene glycol (MIRALAX / GLYCOLAX) packet 17 g  17 g Oral Daily PRN Para Skeans, MD       sodium chloride flush (NS) 0.9 % injection 3 mL  3 mL Intravenous Q12H Para Skeans, MD   3 mL at 06/01/21 0903   Current Outpatient Medications  Medication Sig Dispense Refill   acetaminophen (TYLENOL) 325 MG tablet Take 325 mg by mouth every 6 (six) hours as needed.     amLODipine (NORVASC) 10 MG tablet Take 1 tablet (10 mg total) by mouth every evening. 90 tablet 3   atorvastatin (LIPITOR) 80 MG tablet Take 80 mg by mouth daily.     carvedilol (COREG) 25 MG tablet Take 25 mg by mouth 2 (two) times daily.     cetirizine (ZYRTEC) 10 MG tablet Take 10 mg by mouth daily as needed for allergies.     D3-50 1.25 MG (50000 UT) capsule Take 50,000 Units by  mouth once a week.     gabapentin (NEURONTIN) 100 MG capsule Take 200 mg by mouth at bedtime.     GNP ASPIRIN 81 MG EC tablet Take 81 mg by mouth daily.     hydrALAZINE (APRESOLINE) 100 MG tablet Take 1 tablet (100 mg total) by mouth 2 (two) times daily. May take extra as needed for blood pressure >160 200 tablet 3   insulin detemir (LEVEMIR) 100 unit/ml SOLN Inject 20 Units into the skin at bedtime.     isosorbide mononitrate (ISMO) 20 MG tablet Take 20 mg by mouth daily.     nitroGLYCERIN (NITROSTAT) 0.4 MG SL tablet Place 1 tablet (0.4 mg total) under the tongue every 5 (five) minutes as needed for chest pain. 30 tablet 0   PROAIR HFA 108 (90 Base) MCG/ACT inhaler Inhale 2 puffs into the lungs every 4 (four) hours as needed.     Vitamin D, Ergocalciferol, (DRISDOL) 1.25 MG (50000 UNIT) CAPS capsule Take 50,000 Units by mouth every 7 (seven) days.     carvedilol (COREG) 6.25 MG tablet Take 1 tablet (6.25 mg total) by mouth 2 (two) times daily. (Patient not taking: Reported on 06/01/2021) 180 tablet 3   FLUoxetine (PROZAC) 10 MG capsule Take 10 mg by mouth 2 (two) times daily. (Patient not taking: Reported on 06/01/2021)     insulin lispro (HUMALOG) 100 UNIT/ML injection Inject 3 Units into the skin 3 (three) times daily before meals.     isosorbide mononitrate (IMDUR) 30 MG 24 hr tablet Take 1 tablet (30 mg total) by mouth in the morning and at bedtime. (Patient not taking: Reported on 06/01/2021) 180 tablet 3   PARoxetine (PAXIL) 10 MG tablet Take 10 mg by mouth daily. (Patient not taking: Reported on 06/01/2021)     torsemide (DEMADEX) 20 MG tablet Take 1 tablet (20 mg total) by mouth daily as needed. For swelling, weight gain, or shortness of breath 90 tablet 3   Past Medical History:  Diagnosis Date   Chronic HFrEF (heart failure with reduced ejection fraction) ([place])    a. 06/2020 Echo: EF 30-35%, glob HK, GrII DD, nl RV fxn. Mild LAE.   Chronic kidney disease (CKD), stage IV (severe) ([place])     COVID-19 virus infection 02/2020   Diabetes mellitus without complication (HCC)    Hypertension    NICM (nonischemic cardiomyopathy) ([place])    a. 07/2009 MV: EF  48%; b. 08/2019 Echo: EF 45-50%. Global HK. Mod LVH. GrIDD; c. 06/2020 Echo: EF 30-35%, glob HK, mild LVH, GrII DD, nl RV fxn, mild LAE; d. 06/2020 Cath: nonobs dzs.   Nonobstructive CAD (coronary artery disease)    a. 07/2009 MV: EF 48%. No ischemia; b. 06/2020 Cath: LM min irregs, LAD 40p, 76m, D1 70, D2 min irregs, D3 nl, RI min irregs, LCX small, mild diff dzs, RCA 20p, 28m, 50d, RPAV 50-->Med Rx.   Past Surgical History:  Procedure Laterality Date   APPENDECTOMY     CARDIAC CATHETERIZATION     NASAL SINUS SURGERY     RIGHT/LEFT HEART CATH AND CORONARY ANGIOGRAPHY N/A 07/19/2020   Procedure: RIGHT/LEFT HEART CATH AND CORONARY ANGIOGRAPHY;  Surgeon: Wellington Hampshire, MD;  Location: [facility];  Service: Cardiovascular;  Laterality: N/A;   Social History Social History   Tobacco Use   Smoking status: Never   Smokeless tobacco: Never  Vaping Use   Vaping Use: Never used  Substance Use Topics   Alcohol use: Never   Drug use: Never   Family History Family History  Problem Relation Age of Onset   Kidney failure Mother        died @ 96   Heart failure Mother    Other Father        he never knew his father   Diabetes Brother   Positive for renal issues on his maternal side.  Denies peripheral artery disease or venous disease.  No Known Allergies  REVIEW OF SYSTEMS (Negative unless checked)  Constitutional: [] Weight loss  [] Fever  [] Chills Cardiac: [] Chest pain   [] Chest pressure   [] Palpitations   [x] Shortness of breath when laying flat   [x] Shortness of breath at rest   [x] Shortness of breath with exertion. Vascular:  [] Pain in legs with walking   [] Pain in legs at rest   [] Pain in legs when laying flat   [] Claudication   [] Pain in feet when walking  [] Pain in feet at rest  [] Pain in feet when laying flat    [] History of DVT   [] Phlebitis   [x] Swelling in legs   [] Varicose veins   [] Non-healing ulcers Pulmonary:   [] Uses home oxygen   [] Productive cough   [] Hemoptysis   [] Wheeze  [] COPD   [] Asthma Neurologic:  [] Dizziness  [] Blackouts   [] Seizures   [] History of stroke   [] History of TIA  [] Aphasia   [] Temporary blindness   [] Dysphagia   [] Weakness or numbness in arms   [] Weakness or numbness in legs Musculoskeletal:  [] Arthritis   [] Joint swelling   [] Joint pain   [] Low back pain Hematologic:  [] Easy bruising  [] Easy bleeding   [] Hypercoagulable state   [] Anemic  [] Hepatitis Gastrointestinal:  [] Blood in stool   [] Vomiting blood  [] Gastroesophageal reflux/heartburn   [] Difficulty swallowing. Genitourinary:  [x] Chronic kidney disease   [] Difficult urination  [] Frequent urination  [] Burning with urination   [] Blood in urine Skin:  [] Rashes   [] Ulcers   [] Wounds Psychological:  [] History of anxiety   []  History of major depression.  Physical Examination  Vitals:   06/01/21 0730 06/01/21 0900 06/01/21 0957 06/01/21 1030  BP: (!) 150/85 (!) 168/96 (!) 162/84 (!) 170/88  Pulse: 76 84  77  Resp: 12 18  17   Temp:      TempSrc:      SpO2: 100% 100%  99%  Weight:      Height:       Body mass index  is 30.24 kg/m. Gen:  WD/WN, NAD Head: [place]/AT, No temporalis wasting. Prominent temp pulse not noted. Ear/Nose/Throat: Hearing grossly intact, nares w/o erythema or drainage, oropharynx w/o Erythema/Exudate Eyes: Sclera non-icteric, conjunctiva clear Neck: Trachea midline.  No JVD.  Pulmonary:  Good air movement, respirations not labored, equal bilaterally.  Cardiac: RRR, normal S1, S2. Vascular:  Vessel Right Left  Radial Palpable Palpable  Ulnar Palpable Palpable   Gastrointestinal: soft, non-tender/non-distended. No guarding/reflex.  Musculoskeletal: M/S 5/5 throughout.  Extremities without ischemic changes.  No deformity or atrophy. No edema. Neurologic: Sensation grossly intact in extremities.   Symmetrical.  Speech is fluent. Motor exam as listed above. Psychiatric: Judgment intact, Mood & affect appropriate for pt's clinical situation. Dermatologic: No rashes or ulcers noted.  No cellulitis or open wounds. Lymph : No Cervical, Axillary, or Inguinal lymphadenopathy.  CBC Lab Results  Component Value Date   WBC 7.0 06/01/2021   HGB 7.5 (L) 06/01/2021   HCT 22.6 (L) 06/01/2021   MCV 89.3 06/01/2021   PLT 181 06/01/2021   BMET    Component Value Date/Time   NA 136 05/31/2021 2210   NA 141 12/21/2020 1023   K 5.3 (H) 05/31/2021 2210   CL 106 05/31/2021 2210   CO2 21 (L) 05/31/2021 2210   GLUCOSE 295 (H) 05/31/2021 2210   BUN 69 (H) 05/31/2021 2210   BUN 51 (H) 12/21/2020 1023   CREATININE 4.46 (H) 06/01/2021 0030   CALCIUM 9.0 05/31/2021 2210   GFRNONAA 15 (L) 06/01/2021 0030   GFRAA 30 (L) 03/06/2020 1255   Estimated Creatinine Clearance: 16 mL/min (A) (by C-G formula based on SCr of 4.46 mg/dL (H)).  COAG Lab Results  Component Value Date   INR 1.1 06/01/2021   Radiology DG Chest Portable 1 View  Result Date: 05/31/2021 CLINICAL DATA:  Respiratory distress and shortness of breath. EXAM: PORTABLE CHEST 1 VIEW COMPARISON:  PA Lat chest and chest CT both 12/23/2020 FINDINGS: Mild cardiomegaly is noted with increased central vascular distension and flow cephalization and diffuse increased septal lines consistent with interstitial edema with minimal pleural effusions beginning to develop. There is faint perihilar interstitial haziness which is probably due to ground-glass edema , pneumonitis possible but less likely. No focal consolidation is seen. There is thoracic spondylosis. IMPRESSION: Cardiomegaly with CHF pattern including generalized interstitial edema, perihilar haziness which is probably ground-glass edema and minimal pleural effusions forming. Clinical correlation and radiographic follow-up recommended. Electronically Signed   By: Telford Nab M.D.   On:  05/31/2021 22:13    Assessment/Plan Jimmy Olson is a 60 year old male with a past medical history of chronic apathy, chronic kidney disease, heart disease, diabetes mellitus type 2 who presented to the [facility] emergency department with a chief complaint of shortness of breath.   1.  Acute on chronic kidney disease: Patient with a known history of chronic kidney disease who presented to the [facility] emergency department complaining of shortness of breath.  He was evaluated by the nephrology service would like to initiate dialysis however he does not have an adequate access at this time.  Recommend placement of a PermCath so he can dialyze in the inpatient outpatient setting while formal work-up for a fistula versus a graft can be completed.  Procedure, risks and benefits were explained to the patient and his family was at the bedside.  All questions were answered.  We will plan on this tomorrow with Dr. Lucky Cowboy.  2.  Type 2  diabetes: On appropriate medications. Encouraged good control as its slows the progression of atherosclerotic disease.  3.  Hyperlipidemia: On aspirin and statin for medical management Encouraged good control as its slows the progression of atherosclerotic disease.  Discussed with Dr. Mayme Genta, PA-C 06/01/2021 11:54 AM  This note was created with Dragon medical transcription system.  Any error is purely unintentional.

## 2021-06-01 NOTE — Consult Note (Signed)
Cardiology Consult    Patient ID: Jimmy Olson MRN: [id number], DOB/AGE: Dec 30, 1960   Admit date: 05/31/2021 Date of Consult: 06/01/2021  Primary Physician: [facility], [place] Primary Cardiologist: Ida Rogue, MD Requesting Provider: Chauncey Cruel. Priscella Mann, MD  Patient Profile    Jimmy Olson is a 60 y.o. male with a history of NICM, HFrEF, nonobs CAD, CKD IV, DMII, HTN, and HL, who is being seen today for the evaluation of acute on chronic CHF and NSTEMI at the request of Dr. Priscella Mann.  Past Medical History   Past Medical History:  Diagnosis Date   Chronic HFrEF (heart failure with reduced ejection fraction) ([place])    a. 06/2020 Echo: EF 30-35%, glob HK, GrII DD, nl RV fxn. Mild LAE.   Chronic kidney disease (CKD), stage IV (severe) ([place])    COVID-19 virus infection 02/2020   Diabetes mellitus without complication (HCC)    Hypertension    NICM (nonischemic cardiomyopathy) ([place])    a. 07/2009 MV: EF 48%; b. 08/2019 Echo: EF 45-50%. Global HK. Mod LVH. GrIDD; c. 06/2020 Echo: EF 30-35%, glob HK, mild LVH, GrII DD, nl RV fxn, mild LAE; d. 06/2020 Cath: nonobs dzs.   Nonobstructive CAD (coronary artery disease)    a. 07/2009 MV: EF 48%. No ischemia; b. 06/2020 Cath: LM min irregs, LAD 40p, 47m, D1 70, D2 min irregs, D3 nl, RI min irregs, LCX small, mild diff dzs, RCA 20p, 76m, 50d, RPAV 50-->Med Rx.    Past Surgical History:  Procedure Laterality Date   APPENDECTOMY     CARDIAC CATHETERIZATION     NASAL SINUS SURGERY     RIGHT/LEFT HEART CATH AND CORONARY ANGIOGRAPHY N/A 07/19/2020   Procedure: RIGHT/LEFT HEART CATH AND CORONARY ANGIOGRAPHY;  Surgeon: Wellington Hampshire, MD;  Location: [facility];  Service: Cardiovascular;  Laterality: N/A;     Allergies  No Known Allergies  History of Present Illness    60 year old male with the above complex past medical history including nonischemic cardiomyopathy HFrEF (EF 30 to 35% January 2022), nonobstructive CAD,  stage IV chronic kidney disease, type 2 diabetes mellitus, hypertension, and hyperlipidemia.  Previously had a negative stress test in 2011.  In March 2021, he was admitted with chest pain and elevated troponin.  Echo showed an EF of 45 to 50% with global hypokinesis and moderate LVH.  He was markedly hypertensive at the time and recommendation was made for blood pressure management and outpatient Myoview.  He was lost to follow-up.  In January 2022, he was admitted with recurrent heart failure complicated by stage IV kidney disease.  Echo showed an EF of 30 to 35% with global hypokinesis, grade 2 diastolic dysfunction, and mild mitral regurgitation.  He underwent right and left heart catheterization which showed moderate, nonobstructive CAD and mildly elevated filling pressures.  Cardiomyopathy was felt to be nonischemic in nature.  [name] has been followed in the outpatient setting, and was last seen in cardiology clinic in August 2022.  He is also followed by nephrology.  Via interpreter, Mr. Kinzler reports that he was in his [place] until the evening of 12/13, when he was at home and developed sudden dyspnea associated with chest pressure.  He tried using the bathroom, but b/c of dyspnea, he was unable to get up off of the toilet and asked his wife to call EMS.  He notes that he became presyncopal prior to EMS arrival, but did not lose consciousness.  He began feeling better  w/ the application of O2.  On arrival to the ED, he was markedly hypertensive @ 192/101.  ECG showed sinus rhythm @ 100 w/ baseline artifact and lateral ST depression.  CXR notable for CM and CHF.  Labs w/ normocytic anemia (7.5/22.6), elevated creat sl above prior baseline @ 4.46, and HsTrop elevation (241  370  1372  2862  4844).  He was treated w/ asa, IV lasix, heparin, hydralazine, and IV ntg w/ improvement in dyspnea and chest pressure.  He is currently asymptomatic.  Inpatient Medications     aspirin EC  81 mg Oral Daily    carvedilol  6.25 mg Oral BID   docusate sodium  100 mg Oral BID   furosemide  40 mg Intravenous BID   hydrALAZINE  50 mg Oral BID   insulin aspart  0-15 Units Subcutaneous TID WC   nicotine  14 mg Transdermal Daily   sodium chloride flush  3 mL Intravenous Q12H    Family History    Family History  Problem Relation Age of Onset   Kidney failure Mother        died @ 24   Heart failure Mother    Other Father        he never knew his father   Diabetes Brother    He indicated that his mother is deceased. He indicated that his father is deceased. He indicated that his brother is alive.   Social History    Social History   Socioeconomic History   Marital status: Married    Spouse name: Not on file   Number of children: Not on file   Years of education: Not on file   Highest education level: Not on file  Occupational History   Not on file  Tobacco Use   Smoking status: Never   Smokeless tobacco: Never  Vaping Use   Vaping Use: Never used  Substance and Sexual Activity   Alcohol use: Never   Drug use: Never   Sexual activity: Not on file  Other Topics Concern   Not on file  Social History Narrative   Lives locally with wife and son.  He is currently unemployed - has worked in different industries.     Social Determinants of Health   Financial Resource Strain: Not on file  Food Insecurity: Not on file  Transportation Needs: Not on file  Physical Activity: Not on file  Stress: Not on file  Social Connections: Not on file  Intimate Partner Violence: Not on file     Review of Systems    General:  No chills, fever, night sweats or weight changes.  Cardiovascular:  +++ chest pain, +++ sudden dyspnea, no edema, +++ sudden orthopnea, +++ presyncope, no palpitations, paroxysmal nocturnal dyspnea. Dermatological: No rash, lesions/masses Respiratory: No cough, +++ dyspnea Urologic: No hematuria, dysuria Abdominal:   No nausea, vomiting, diarrhea, bright red blood per  rectum, melena, or hematemesis Neurologic:  No visual changes, wkns, changes in mental status. All other systems reviewed and are otherwise negative except as noted above.  Physical Exam    Blood pressure (!) 170/88, pulse 77, temperature 97.6 F (36.4 C), temperature source Oral, resp. rate 17, height 5\' 2"  (1.575 m), weight 75 kg, SpO2 99 %.  General: Pleasant, NAD Psych: Normal affect. Neuro: Alert and oriented X 3. Moves all extremities spontaneously. HEENT: Normal  Neck: Supple without bruits.  JVP mildly elevated. Lungs:  Resp regular and unlabored, bibasilar crackles. Heart: RRR no s3, s4, murmurs,  gallops. Abdomen: Soft, non-tender, non-distended, BS + x 4.  Extremities: No clubbing, cyanosis or edema. DP/PT1+, Radials 2+ and equal bilaterally.  Labs    Cardiac Enzymes Recent Labs  Lab 05/31/21 2348 06/01/21 0204 06/01/21 0445 06/01/21 0746 06/01/21 1002  TROPONINIHS 370* 1,372* 2,862* 3,611* 4,844*      Lab Results  Component Value Date   WBC 7.0 06/01/2021   HGB 7.5 (L) 06/01/2021   HCT 22.6 (L) 06/01/2021   MCV 89.3 06/01/2021   PLT 181 06/01/2021    Recent Labs  Lab 05/31/21 2210 06/01/21 0030  NA 136  --   K 5.3*  --   CL 106  --   CO2 21*  --   BUN 69*  --   CREATININE 4.61* 4.46*  CALCIUM 9.0  --   GLUCOSE 295*  --    Lab Results  Component Value Date   CHOL 186 07/16/2020   HDL 40 (L) 07/16/2020   LDLCALC 110 (H) 07/16/2020   TRIG 178 (H) 07/16/2020    Radiology Studies    DG Chest Portable 1 View  Result Date: 05/31/2021 CLINICAL DATA:  Respiratory distress and shortness of breath. EXAM: PORTABLE CHEST 1 VIEW COMPARISON:  PA Lat chest and chest CT both 12/23/2020 FINDINGS: Mild cardiomegaly is noted with increased central vascular distension and flow cephalization and diffuse increased septal lines consistent with interstitial edema with minimal pleural effusions beginning to develop. There is faint perihilar interstitial haziness which  is probably due to ground-glass edema , pneumonitis possible but less likely. No focal consolidation is seen. There is thoracic spondylosis. IMPRESSION: Cardiomegaly with CHF pattern including generalized interstitial edema, perihilar haziness which is probably ground-glass edema and minimal pleural effusions forming. Clinical correlation and radiographic follow-up recommended. Electronically Signed   By: Telford Nab M.D.   On: 05/31/2021 22:13    ECG & Cardiac Imaging    05/31/21 2205: RSR, 100, baseline artifact, lateral ST dep - personally reviewed. 06/01/21 0547: RSR, 89, nonspecific T changes  Assessment & Plan    1.  Acute on chronic HFrEF/NICM:  EF 30-35% by echo in 06/2020. Cath @ that time w/ moderate nonobs dzs.  He had recently been doing well but had sudden onset of dyspnea and chest pressure on the evening of 12/13, prompting him to call EMS.  In the ED, he was markedly hypertensive @ 192/101.  ECG w/ baseline artifact and lateral ST depression, HsTrops elevated (see #2).  Ss improved w/ O2, lasix, hydralazine, IV ntg, heparin.  Currently resting comfortably.  Bibasilar crackles one exam and CHF on cxr.  Seen by nephrology w/ plan to initiate HD for volume mgmt - defer diuretic dosing to nephrology.  Cont ? blocker, hydralazine/IV nitrate.  Will reconsider acei/arb/arni once on HD, though K mildly elevated @ 5.3 on presentation.  2.  NSTEMI/CAD:  In setting of #1, HsTrops elevated to 241   370   1372   2862  4844.  Currently c/p free following diuresis/heparin/IV ntg.  Moderate nonobs CAD on cath in 06/2020.  In light of current NSTEMI w/ significant HsTrop elevation, we will consider repeat diagnostic cath once he has been established on HD.  For now, cont hep x 48 hrs, nitrate (can likely switch to PO if BP/c/p stable), ? blocker, and resume home dose of high potency statin.    3.  Hypertensive Urgency:  in setting of above.  Cont ? blocker, hydral, nitrate.  Will increase hydralazine to  TID.  4.  HL:  resume home dose of atorva 80.  5.  CKD IV-V: Nephrology has seen w/ plan for initiation of HD.  6.  Normocytic anemia:  H/H lower @ 7.5/22.6.  Likely playing a role in ischemia.  Nephrology to consider EPO.  7.  DMII:  A1c 10.3 in July.  Per IM.  Signed, Murray Hodgkins, NP 06/01/2021, 12:32 PM  For questions or updates, please contact   Please consult [email or web address] for contact info under Cardiology/STEMI.

## 2021-06-01 NOTE — ED Notes (Signed)
ESL translator utilized. Pt informed of procedures to be done. Pt advised will need to be  switched  from high flow O2 to BiPAP

## 2021-06-01 NOTE — ED Notes (Signed)
Physician at bedside.

## 2021-06-01 NOTE — TOC Progression Note (Signed)
Transition of Care [facility]) - Progression Note    Patient Details  Name: CRIST KRUSZKA MRN: [id number] Date of Birth: [birth year]  Transition of Care [facility]) CM/SW Contact  Shelbie Hutching, RN Phone Number: 06/01/2021, 2:51 PM  Clinical Narrative:    Sharyn Creamer with [facility] is off this week so Thayer Headings with [facility] given referral.     Expected Discharge Plan: [place] Barriers to Discharge: Continued Medical Work up  Expected Discharge Plan and Services Expected Discharge Plan: [place]   Discharge Planning Services: CM Consult Post Acute Care Choice: [place] arrangements for the past 2 months: Single Family Home                 DME Arranged: N/A DME Agency: NA       HH Arranged: PT           Social Determinants of Health (SDOH) Interventions    Readmission Risk Interventions Readmission Risk Prevention Plan 06/01/2021  Transportation Screening Complete  PCP or Specialist Appt within 3-5 Days Complete  HRI or [place] Complete  Social Work Consult for [place] Planning/Counseling Complete  Palliative Care Screening Not Applicable  Medication Review (RN Care Manager) Referral to Pharmacy  Some recent data might be hidden

## 2021-06-01 NOTE — ED Notes (Signed)
Admit MD at bedside

## 2021-06-01 NOTE — TOC Initial Note (Signed)
Transition of Care [facility]) - Initial/Assessment Note    Patient Details  Name: Jimmy Olson MRN: [id number] Date of Birth: Jul 31, 1960  Transition of Care [facility]) CM/SW Contact:    Shelbie Hutching, RN Phone Number: 06/01/2021, 2:13 PM  Clinical Narrative:                 Patient admitted to the hospital for CHF and kidney failure.  Nephrology wants to start dialysis on patient and he is scheduled for dialysis catheter tomorrow.    RNCM met with patient at the bedside with the [place].  Patient is from home where he lives with his wife and adult son, son is disabled.  Patient is able to drive so is his wife so she will be able to provide transportation for him and help him at home as he is not safe to be alone.  His wife works nights and sleeps during the day but she is there at home if he needs her.  Patient has a walker, he has had home health PT in the past and agrees with that again.  Patient has no insurance so it will be charity HH.  Looks like he was just discharged from [facility] for skilled nursing.   Patient is current with [facility] for PCP services and gets his prescriptions from there.    Reached out to [employer] [employer] with [facility] to see if they can accept for charity [facility] services.    Expected Discharge Plan: [place] Barriers to Discharge: Continued Medical Work up   Patient Goals and CMS Choice   CMS Medicare.gov Compare Post Acute Care list provided to:: Patient Choice offered to / list presented to : Patient  Expected Discharge Plan and Services Expected Discharge Plan: [place]   Discharge Planning Services: CM Consult Post Acute Care Choice: [place] arrangements for the past 2 months: Single Family Home                 DME Arranged: N/A DME Agency: NA       HH Arranged: PT          Prior Living Arrangements/Services Living arrangements for the past 2 months: Single Family Home Lives with:: Adult  Children, Spouse Patient language and need for interpreter reviewed:: Yes (Spanish) Do you feel safe going back to the place where you live?: Yes      Need for Family Participation in Patient Care: Yes (Comment) Care giver support system in place?: Yes (comment) (wife) Current home services: DME (walker, 3 in 1) Criminal Activity/Legal Involvement Pertinent to Current Situation/Hospitalization: No - Comment as needed  Activities of Daily Living      Permission Sought/Granted Permission sought to share information with : Case Manager, Family Supports, Other (comment) Permission granted to share information with : Yes, Verbal Permission Granted  Share Information with NAME: Jovane Foutz  Permission granted to share info w AGENCY: home health agency  Permission granted to share info w Relationship: spouse  Permission granted to share info w Contact Information: [phone]  Emotional Assessment Appearance:: Appears stated age Attitude/Demeanor/Rapport: Engaged Affect (typically observed): Accepting Orientation: : Oriented to Self, Oriented to Place, Oriented to  Time, Oriented to Situation Alcohol / Substance Use: Not Applicable Psych Involvement: No (comment)  Admission diagnosis:  SOB (shortness of breath) [R06.02] Acute decompensated heart failure (HCC) [I50.9] Patient Active Problem List   Diagnosis Date Noted   SOB (shortness of breath) 06/01/2021  Anemia 06/01/2021   Acute decompensated heart failure ([place]) 06/01/2021   Hyperkalemia 12/23/2020   Chronic combined systolic and diastolic CHF (congestive heart failure) ([place]) 12/23/2020   CKD (chronic kidney disease), stage IV ([place]) 12/23/2020   Hyperlipidemia associated with type 2 diabetes mellitus ([place]) 12/23/2020   Coronary artery disease involving native coronary artery of native heart without angina pectoris    Demand ischemia (HCC)    ATN (acute tubular necrosis) (HCC)    Severe recurrent major depression without  psychotic features ([place]) 07/16/2020   Elevated troponin 07/15/2020   Type II diabetes mellitus with renal manifestations ([place]) 07/15/2020   Acute on chronic HFrEF (heart failure with reduced ejection fraction) ([place]) 07/15/2020   Acute renal failure superimposed on stage 4 chronic kidney disease ([place]) 07/15/2020   HLD (hyperlipidemia) 07/15/2020   HFrEF (heart failure with reduced ejection fraction) ([place])    Hypertension associated with diabetes ([place])    Acute on chronic respiratory failure with hypoxia ([place]) 09/13/2019   Community acquired pneumonia 09/11/2019   Acute respiratory failure with hypercapnia ([place]) 09/11/2019   Accelerated hypertension 09/11/2019   Type 2 diabetes mellitus with hyperlipidemia ([place]) 09/11/2019   Type 2 diabetes mellitus with other specified complication ([place]) 75/03/2584   AKI (acute kidney injury) ([place]) 09/11/2019   PCP:  [facility], [place]:   [place], [name] [address] Phone: [phone] Fax: [phone]  [facility] [address] [name] [address] Phone: [phone] Fax: [phone]  [place] [address], [name] - [name] [address] Phone: [phone] Fax: [phone]     Social Determinants of Health (SDOH) Interventions    Readmission Risk Interventions Readmission Risk Prevention Plan 06/01/2021  Transportation Screening Complete  PCP or Specialist Appt within 3-5 Days Complete  HRI or [place] Complete  Social Work Consult for [place] Planning/Counseling Complete  Palliative Care Screening Not Applicable  Medication Review (RN Care Manager) Referral to Pharmacy  Some recent data might be hidden

## 2021-06-02 ENCOUNTER — Encounter
Admission: EM | Disposition: A | Discharge status: Home Health | Payer: Self-pay | Source: Home / Self Care | Attending: Internal Medicine

## 2021-06-02 ENCOUNTER — Inpatient Hospital Stay: Payer: Medicaid Other

## 2021-06-02 ENCOUNTER — Inpatient Hospital Stay (HOSPITAL_COMMUNITY)
Admit: 2021-06-02 | Discharge: 2021-06-02 | Disposition: A | Discharge status: Home / Self Care | Payer: Medicaid Other | Attending: Internal Medicine | Admitting: Internal Medicine

## 2021-06-02 DIAGNOSIS — I10 Essential (primary) hypertension: Secondary | ICD-10-CM

## 2021-06-02 DIAGNOSIS — I25118 Atherosclerotic heart disease of native coronary artery with other forms of angina pectoris: Secondary | ICD-10-CM

## 2021-06-02 DIAGNOSIS — I509 Heart failure, unspecified: Secondary | ICD-10-CM

## 2021-06-02 DIAGNOSIS — N185 Chronic kidney disease, stage 5: Secondary | ICD-10-CM

## 2021-06-02 DIAGNOSIS — I248 Other forms of acute ischemic heart disease: Secondary | ICD-10-CM

## 2021-06-02 DIAGNOSIS — I469 Cardiac arrest, cause unspecified: Secondary | ICD-10-CM

## 2021-06-02 HISTORY — PX: DIALYSIS/PERMA CATHETER INSERTION: CATH118288

## 2021-06-02 LAB — BASIC METABOLIC PANEL
Anion gap: 8 (ref 5–15)
BUN: 76 mg/dL — ABNORMAL HIGH (ref 6–20)
CO2: 23 mmol/L (ref 22–32)
Calcium: 9.1 mg/dL (ref 8.9–10.3)
Chloride: 106 mmol/L (ref 98–111)
Creatinine, Ser: 4.69 mg/dL — ABNORMAL HIGH (ref 0.61–1.24)
GFR, Estimated: 14 mL/min — ABNORMAL LOW (ref >60)
Glucose, Bld: 194 mg/dL — ABNORMAL HIGH (ref 70–99)
Potassium: 5.1 mmol/L (ref 3.5–5.1)
Sodium: 137 mmol/L (ref 135–145)

## 2021-06-02 LAB — CBC
HCT: 22.9 % — ABNORMAL LOW (ref 39.0–52.0)
Hemoglobin: 7.8 g/dL — ABNORMAL LOW (ref 13.0–17.0)
MCH: 29.8 pg (ref 26.0–34.0)
MCHC: 34.1 g/dL (ref 30.0–36.0)
MCV: 87.4 fL (ref 80.0–100.0)
Platelets: 196 10*3/uL (ref 150–400)
RBC: 2.62 MIL/uL — ABNORMAL LOW (ref 4.22–5.81)
RDW: 12.1 % (ref 11.5–15.5)
WBC: 5.1 10*3/uL (ref 4.0–10.5)
nRBC: 0 % (ref 0.0–0.2)

## 2021-06-02 LAB — GLUCOSE, CAPILLARY
Glucose-Capillary: 153 mg/dL — ABNORMAL HIGH (ref 70–99)
Glucose-Capillary: 187 mg/dL — ABNORMAL HIGH (ref 70–99)
Glucose-Capillary: 185 mg/dL — ABNORMAL HIGH (ref 70–99)
Glucose-Capillary: 248 mg/dL — ABNORMAL HIGH (ref 70–99)

## 2021-06-02 LAB — HEPARIN LEVEL (UNFRACTIONATED): Heparin Unfractionated: 0.35 IU/mL (ref 0.30–0.70)

## 2021-06-02 LAB — ECHOCARDIOGRAM COMPLETE
AR max vel: 1.98 cm2
AV Area VTI: 1.9 cm2
AV Area mean vel: 1.92 cm2
AV Mean grad: 5 mmHg
AV Peak grad: 7.7 mmHg
Ao pk vel: 1.39 m/s
Area-P 1/2: 3.46 cm2
Height: 62 in
MV VTI: 2.57 cm2
S' Lateral: 3.8 cm
Weight: 2323.2 oz

## 2021-06-02 LAB — HEPATITIS B SURFACE ANTIBODY, QUANTITATIVE: Hep B S AB Quant (Post): <3.1 m[IU]/mL — ABNORMAL LOW (ref >9.9)

## 2021-06-02 SURGERY — DIALYSIS/PERMA CATHETER INSERTION
Anesthesia: Moderate Sedation

## 2021-06-02 MED ORDER — HYDROMORPHONE HCL 1 MG/ML IJ SOLN: 1.0000 mg | Freq: Once | INTRAMUSCULAR | Status: DC | PRN

## 2021-06-02 MED ORDER — HEPARIN SODIUM (PORCINE) 1000 UNIT/ML IJ SOLN
INTRAMUSCULAR | Status: AC
  Filled 2021-06-02: qty 10

## 2021-06-02 MED ORDER — FENTANYL CITRATE (PF) 100 MCG/2ML IJ SOLN
INTRAMUSCULAR | Status: DC | PRN
  Administered 2021-06-02: 50 ug via INTRAVENOUS

## 2021-06-02 MED ORDER — PENTAFLUOROPROP-TETRAFLUOROETH EX AERO
1.0000 "application " | INHALATION_SPRAY | CUTANEOUS | Status: DC | PRN
  Filled 2021-06-02: qty 30

## 2021-06-02 MED ORDER — CARVEDILOL 12.5 MG PO TABS
12.5000 mg | ORAL_TABLET | Freq: Two times a day (BID) | ORAL | Status: DC
  Administered 2021-06-02 – 2021-06-05 (×5): 12.5 mg via ORAL
  Filled 2021-06-02 (×5): qty 1

## 2021-06-02 MED ORDER — ALTEPLASE 2 MG IJ SOLR: 2.0000 mg | Freq: Once | INTRAMUSCULAR | Status: DC | PRN

## 2021-06-02 MED ORDER — FENTANYL CITRATE PF 50 MCG/ML IJ SOSY
PREFILLED_SYRINGE | INTRAMUSCULAR | Status: AC
  Filled 2021-06-02: qty 1

## 2021-06-02 MED ORDER — SODIUM CHLORIDE 0.9 % IV SOLN: INTRAVENOUS | Status: DC

## 2021-06-02 MED ORDER — LIDOCAINE-PRILOCAINE 2.5-2.5 % EX CREA
1.0000 "application " | TOPICAL_CREAM | CUTANEOUS | Status: DC | PRN
  Filled 2021-06-02: qty 5

## 2021-06-02 MED ORDER — LORAZEPAM 2 MG/ML IJ SOLN
0.5000 mg | Freq: Four times a day (QID) | INTRAMUSCULAR | Status: DC | PRN
  Administered 2021-06-02: 11:00:00 0.5 mg via INTRAVENOUS
  Filled 2021-06-02: qty 1

## 2021-06-02 MED ORDER — MIDAZOLAM HCL 2 MG/2ML IJ SOLN
INTRAMUSCULAR | Status: AC
  Filled 2021-06-02: qty 2

## 2021-06-02 MED ORDER — CEFAZOLIN SODIUM-DEXTROSE 1-4 GM/50ML-% IV SOLN
1.0000 g | Freq: Once | INTRAVENOUS | Status: AC
  Administered 2021-06-02: 1 g via INTRAVENOUS

## 2021-06-02 MED ORDER — HEPARIN SODIUM (PORCINE) 1000 UNIT/ML DIALYSIS
1000.0000 [IU] | INTRAMUSCULAR | Status: DC | PRN
  Filled 2021-06-02: qty 1

## 2021-06-02 MED ORDER — LIDOCAINE HCL (PF) 1 % IJ SOLN
5.0000 mL | INTRAMUSCULAR | Status: DC | PRN
  Filled 2021-06-02: qty 5

## 2021-06-02 MED ORDER — MIDAZOLAM HCL 2 MG/2ML IJ SOLN
INTRAMUSCULAR | Status: DC | PRN
  Administered 2021-06-02: 2 mg via INTRAVENOUS

## 2021-06-02 MED ORDER — DIPHENHYDRAMINE HCL 50 MG/ML IJ SOLN: 50.0000 mg | Freq: Once | INTRAMUSCULAR | Status: DC | PRN

## 2021-06-02 MED ORDER — FAMOTIDINE 20 MG PO TABS: 40.0000 mg | ORAL_TABLET | Freq: Once | ORAL | Status: DC | PRN

## 2021-06-02 MED ORDER — SODIUM CHLORIDE 0.9 % IV SOLN: 250.0000 mL | INTRAVENOUS | Status: DC | PRN

## 2021-06-02 MED ORDER — SODIUM CHLORIDE 0.9% FLUSH: 3.0000 mL | Freq: Two times a day (BID) | INTRAVENOUS | Status: DC

## 2021-06-02 MED ORDER — ONDANSETRON HCL 4 MG/2ML IJ SOLN
4.0000 mg | Freq: Four times a day (QID) | INTRAMUSCULAR | Status: DC | PRN
  Administered 2021-06-02: 4 mg via INTRAVENOUS

## 2021-06-02 MED ORDER — SODIUM CHLORIDE 0.9 % IV SOLN: 100.0000 mL | INTRAVENOUS | Status: DC | PRN

## 2021-06-02 MED ORDER — METHYLPREDNISOLONE SODIUM SUCC 125 MG IJ SOLR: 125.0000 mg | Freq: Once | INTRAMUSCULAR | Status: DC | PRN

## 2021-06-02 MED ORDER — HYDRALAZINE HCL 50 MG PO TABS
50.0000 mg | ORAL_TABLET | Freq: Three times a day (TID) | ORAL | Status: DC
  Administered 2021-06-02: 50 mg via ORAL
  Filled 2021-06-02: qty 1

## 2021-06-02 MED ORDER — MIDAZOLAM HCL 2 MG/ML PO SYRP: 8.0000 mg | ORAL_SOLUTION | Freq: Once | ORAL | Status: DC | PRN

## 2021-06-02 MED ORDER — SODIUM CHLORIDE 0.9% FLUSH: 3.0000 mL | INTRAVENOUS | Status: DC | PRN

## 2021-06-02 MED ORDER — HYDRALAZINE HCL 20 MG/ML IJ SOLN
10.0000 mg | INTRAMUSCULAR | Status: DC | PRN
  Administered 2021-06-02 – 2021-06-05 (×4): 10 mg via INTRAVENOUS
  Filled 2021-06-02 (×3): qty 1

## 2021-06-02 SURGICAL SUPPLY — 6 items
CATH GLIDEPATH RETRO 14.5X19 (CATHETERS) ×1 IMPLANT
DERMABOND ADVANCED (GAUZE/BANDAGES/DRESSINGS) ×1
DERMABOND ADVANCED .7 DNX12 (GAUZE/BANDAGES/DRESSINGS) IMPLANT
PACK ANGIOGRAPHY (CUSTOM PROCEDURE TRAY) ×1 IMPLANT
SUT MNCRL AB 4-0 PS2 18 (SUTURE) ×1 IMPLANT
SUT PROLENE 0 CT 1 30 (SUTURE) ×1 IMPLANT

## 2021-06-02 NOTE — Progress Notes (Signed)
ANTICOAGULATION CONSULT NOTE  Pharmacy Consult for heparin infusion Indication: NSTEMI  Patient Measurements: Height: 5\' 2"  (157.5 cm) Weight: 65.9 kg (145 lb 3.2 oz) (first weight done on telemetry) IBW/kg (Calculated) : 54.6 Heparin Dosing Weight: 70.3 kg  Labs: Recent Labs    05/31/21 2210 05/31/21 2348 06/01/21 0030 06/01/21 0204 06/01/21 0445 06/01/21 0746 06/01/21 1002 06/01/21 1118 06/01/21 2108 06/02/21 0705  HGB 9.5*  --   --  8.0* 7.5*  --   --   --   --  7.8*  HCT 28.6*  --   --  24.1* 22.6*  --   --   --   --  22.9*  PLT 231  --   --  183 181  --   --   --   --  196  APTT  --   --   --  25  --   --   --   --   --   --   LABPROT  --   --   --  13.7  --   --   --   --   --   --   INR  --   --   --  1.1  --   --   --   --   --   --   HEPARINUNFRC  --   --   --   --   --   --   --  0.20* 0.37 0.35  CREATININE 4.61*  --  4.46*  --   --   --   --   --   --  4.69*  TROPONINIHS 241*   < >  --  1,372* 2,862* 3,611* 4,844*  --   --   --    < > = values in this interval not displayed.     Estimated Creatinine Clearance: 14.4 mL/min (A) (by C-G formula based on SCr of 4.69 mg/dL (H)).   Medical History: Past Medical History:  Diagnosis Date   Chronic HFrEF (heart failure with reduced ejection fraction) ([place])    a. 06/2020 Echo: EF 30-35%, glob HK, GrII DD, nl RV fxn. Mild LAE.   Chronic kidney disease (CKD), stage IV (severe) ([place])    COVID-19 virus infection 02/2020   Diabetes mellitus without complication (HCC)    Hypertension    NICM (nonischemic cardiomyopathy) ([place])    a. 07/2009 MV: EF 48%; b. 08/2019 Echo: EF 45-50%. Global HK. Mod LVH. GrIDD; c. 06/2020 Echo: EF 30-35%, glob HK, mild LVH, GrII DD, nl RV fxn, mild LAE; d. 06/2020 Cath: nonobs dzs.   Nonobstructive CAD (coronary artery disease)    a. 07/2009 MV: EF 48%. No ischemia; b. 06/2020 Cath: LM min irregs, LAD 40p, 5m, D1 70, D2 min irregs, D3 nl, RI min irregs, LCX small, mild diff dzs, RCA 20p, 7m, 50d,  RPAV 50-->Med Rx.    Assessment: Pt is 60 yo male with h/o CKD, DM Type 2, Heart Disease presenting to ED c/o SOB, found with slightly elevated Troponin I, trending up. Pharmacy has been consulted for heparin dosing/monitoring.  Goal of Therapy:  Heparin level 0.3-0.7 units/ml Monitor platelets by anticoagulation protocol: Yes  Date/time  Level  Comment 12/14@2108   HL0.37  Therapeutic x1 @ 1050 units/hr 12/15@0705   HL 0.35 Therapeutic x2 @ 1050 units/hr   Plan:  Heparin level is therapeutic x 2 Maintain heparin infusion at 1050 units/hr Recheck HL with AM labs CBC daily while on heparin.  Zamariya Neal  Rodriguez-Laskin PharmD, BCPS 06/02/2021 8:32 AM

## 2021-06-02 NOTE — H&P (View-Only) (Signed)
Progress Note  Patient Name: Jimmy Olson Date of Encounter: 06/02/2021  [facility] Cardiologist: Ida Rogue, MD   Patient Profile     61 y.o. male with history of presumably Nonischemic Cardiomyopathy (EF out of proportion to moderate CAD noted by catheterization January 2022) with CKD-5/ESRD, DM-2, HTN, HLD and anemia of chronic disease admitted with dyspnea, pulm edema consistent with acute decompensated CHF/volume overload. -> Has been seen by nephrology with plans for dialysis catheter being placed 06/03/2019 in order to initiate dialysis this hospitalization. -> Has had elevated troponin levels over 4000 (non-STEMI versus demand ischemia with CKD/ESRD).  Interview conducted with the assistance of Spanish interpreter Herb Grays)  Subjective   Currently no chest pain, dizziness dyspnea.  No orthopnea.  No edema.  Some nausea.  Somewhat anxious, awaiting dialysis.  Inpatient Medications    Scheduled Meds:  aspirin EC  81 mg Oral Daily   atorvastatin  80 mg Oral Daily   carvedilol  6.25 mg Oral BID   Chlorhexidine Gluconate Cloth  6 each Topical Q0600   docusate sodium  100 mg Oral BID   furosemide  40 mg Intravenous BID   hydrALAZINE  50 mg Oral BID   insulin aspart  0-15 Units Subcutaneous TID WC   nicotine  14 mg Transdermal Daily   sodium chloride flush  3 mL Intravenous Q12H   Continuous Infusions:  heparin 1,050 Units/hr (06/02/21 0000)   nitroGLYCERIN 100 mcg/min (06/02/21 0640)   PRN Meds: acetaminophen **OR** acetaminophen, albuterol, hydrALAZINE, morphine injection, ondansetron **OR** ondansetron (ZOFRAN) IV, oxyCODONE, polyethylene glycol   Vital Signs    Vitals:   06/02/21 0531 06/02/21 0545 06/02/21 0600 06/02/21 0615  BP: (!) 155/78 (!) 156/71 (!) 162/82 (!) 155/77  Pulse: 84 79 82 82  Resp: 19 15 19 16   Temp:      TempSrc:      SpO2: 100% 100% 100% 100%  Weight:  65.9 kg    Height:        Intake/Output Summary (Last 24 hours) at  06/02/2021 0955 Last data filed at 06/02/2021 0925 Gross per 24 hour  Intake 660.92 ml  Output 2000 ml  Net -1339.08 ml   Last 3 Weights 06/02/2021 05/31/2021 02/28/2021  Weight (lbs) 145 lb 3.2 oz 165 lb 5.5 oz 156 lb  Weight (kg) 65.862 kg 75 kg 70.761 kg      Telemetry    Sinus rhythm with heart rate in the 60s 80s.- Personally Reviewed  ECG    No new EKG- Personally Reviewed  Physical Exam   GEN: No acute distress.  Resting comfortably in bed Neck: No JVD or HJR. Cardiac: RRR, normal S1-S2.  No M/R/G.   Respiratory: Clear to auscultation bilaterally. GI  Nonlabored, good air movement.: Soft, nontender, non-distended  MS: No edema; No deformity. Neuro:  Nonfocal  Psych: Normal affect   Labs    High Sensitivity Troponin:   Recent Labs  Lab 05/31/21 2348 06/01/21 0204 06/01/21 0445 06/01/21 0746 06/01/21 1002  TROPONINIHS 370* 1,372* 2,862* 3,611* 4,844*     Chemistry Recent Labs  Lab 05/31/21 2210 06/01/21 0026 06/01/21 0030 06/02/21 0705  NA 136  --   --  137  K 5.3*  --   --  5.1  CL 106  --   --  106  CO2 21*  --   --  23  GLUCOSE 295*  --   --  194*  BUN 69*  --   --  76*  CREATININE 4.61*  --  4.46* 4.69*  CALCIUM 9.0  --   --  9.1  MG  --  1.9  --   --   GFRNONAA 14*  --  15* 14*  ANIONGAP 9  --   --  8    Lipids No results for input(s): CHOL, TRIG, HDL, LABVLDL, LDLCALC, CHOLHDL in the last 168 hours.  Hematology Recent Labs  Lab 06/01/21 0204 06/01/21 0445 06/02/21 0705  WBC 8.2 7.0 5.1  RBC 2.67* 2.53* 2.62*  HGB 8.0* 7.5* 7.8*  HCT 24.1* 22.6* 22.9*  MCV 90.3 89.3 87.4  MCH 30.0 29.6 29.8  MCHC 33.2 33.2 34.1  RDW 12.1 12.1 12.1  PLT 183 181 196   Thyroid No results for input(s): TSH, FREET4 in the last 168 hours.  BNP Recent Labs  Lab 05/31/21 2210  BNP 1,820.5*    DDimer No results for input(s): DDIMER in the last 168 hours.   Radiology    [place] UE VEIN MAPPING LEFT (PRE-OP AVF)  Result Date: 06/02/2021 CLINICAL  DATA:  Preop evaluation for AV fistula creation EXAM: [place] EXTREM UP VEIN MAPPING COMPARISON:  None. FINDINGS: LEFT ARTERIES Wrist Radial Artery: Size 2.35mm Waveform triphasic Wrist Ulnar Artery: Size 2.51mm Waveform triphasic Prox. Forearm Radial Artery: Size 3.50mm Waveform triphasic Upper Arm Brachial Artery: Size 4.43mm Waveform triphasic LEFT VEINS Forearm Cephalic Vein: Prox 4.5WU Distal 2.81mm Depth 9.8JX Upper Arm Cephalic Vein: Prox 9.1YN Distal 4.51mm Depth 8.2-9.5AO Upper Arm Basilic Vein: Prox 1.3YQ Distal 3.78mm Depth 3.9-8.65mm Upper Arm Brachial Vein: Prox 4.16mm Distal 3.30mm Depth 8.5-14.70mm ADDITIONAL LEFT VEINS Axillary Vein:  6.37mm (occluded, noncompressible) Subclavian Vein: Patient: Yes Respiratory Phasicity: Present Internal Jugular Vein: Patent: Yes    Respiratory Phasicity: Present IMPRESSION: 1. DVT noted within the left axillary vein. 2. Left upper extremity vasculature measurements as above. These results will be called to the ordering clinician or representative by the Radiologist Assistant, and communication documented in the PACS or [employer]. Electronically Signed   By: Miachel Roux M.D.   On: 06/02/2021 09:27   DG Chest Portable 1 View  Result Date: 05/31/2021 CLINICAL DATA:  Respiratory distress and shortness of breath. EXAM: PORTABLE CHEST 1 VIEW COMPARISON:  PA Lat chest and chest CT both 12/23/2020 FINDINGS: Mild cardiomegaly is noted with increased central vascular distension and flow cephalization and diffuse increased septal lines consistent with interstitial edema with minimal pleural effusions beginning to develop. There is faint perihilar interstitial haziness which is probably due to ground-glass edema , pneumonitis possible but less likely. No focal consolidation is seen. There is thoracic spondylosis. IMPRESSION: Cardiomegaly with CHF pattern including generalized interstitial edema, perihilar haziness which is probably ground-glass edema and minimal pleural effusions  forming. Clinical correlation and radiographic follow-up recommended. Electronically Signed   By: Telford Nab M.D.   On: 05/31/2021 22:13    Cardiac Studies   Cardiac Cath January 2022: pRCA 20%, mRCA 30%, dRCA 50% involving 50% rPAV. pLAD ~40% &D1 70%, mLAD 60% (not flow limiting). RHC - mildly elevated filling pressures - mild pulm HTN with normal CO/CI.   Echo completed - not yet read -> EF appears to be notably improved compared with 30 to 35% seen in January 2022.  No obvious wall motion normalities.  Does berry to be somewhat dilated, but EF at least 45%, visual estimation.  Still appears to have GRII DD.   Assessment & Plan    Acute on chronic combined CHF: Echo pending, but EF appears to  be better than it had been. Currently on IV Lasix volume control seem to be hemodialysis.   On carvedilol and hydralazine plus nitrate.  Blood pressure still adequately controlled, can probably titrate up both.    Troponin elevation significant please consider possible non-STEMI versus demand ischemia. Faythe Dingwall moderate nonocclusive CAD. On IV heparin for at least 48 hours. Currently on IV NTG. Delaying catheterization until dialysis initiated. Potentially consider cardiac catheterization tomorrow if dialysis started today or tomorrow Aspirin, hold off on Thienopyridine pending HD cath placement and cardiac catheterization. HLD: High-dose atorvastatin  CKD-5 now ESRD.  Plans for HD cath placement today followed by initiation of HD.  (We will hold off on cardiac catheterization until confirmation of dialysis being performed)    Shared Decision Making/Informed Consent The risks [stroke (1 in 1000), death (1 in 1000), kidney failure [usually temporary] (1 in 500), bleeding (1 in 200), allergic reaction [possibly serious] (1 in 200)], benefits (diagnostic support and management of coronary artery disease) and alternatives of a cardiac catheterization were discussed in detail with Mr. Winokur and he is  willing to proceed.    For questions or updates, please contact [place] Please consult [email or web address] for contact info under        Signed, Glenetta Hew, MD  06/02/2021, 9:55 AM

## 2021-06-02 NOTE — Progress Notes (Signed)
Pt returned from dialysis in pain, BP systolic in the 544B, and agitated. Asked patient if he can stand to get his post dialysis weight and patient said he was not able to stand at this time. A bed weight was obtained and I apologize, but he cannot stand at this time.  Bridgette Habermann DNP RN

## 2021-06-02 NOTE — Progress Notes (Signed)
During procedure for PermCath placement, used Left arm PIV with NS infusing.

## 2021-06-02 NOTE — Progress Notes (Signed)
Progress Note  Patient Name: Jimmy Olson Date of Encounter: 06/02/2021  [facility] Cardiologist: Jimmy Rogue, MD   Patient Profile     60 y.o. male with history of presumably Nonischemic Cardiomyopathy (EF out of proportion to moderate CAD noted by catheterization January 2022) with CKD-5/ESRD, DM-2, HTN, HLD and anemia of chronic disease admitted with dyspnea, pulm edema consistent with acute decompensated CHF/volume overload. -> Has been seen by nephrology with plans for dialysis catheter being placed 06/03/2019 in order to initiate dialysis this hospitalization. -> Has had elevated troponin levels over 4000 (non-STEMI versus demand ischemia with CKD/ESRD).  Interview conducted with the assistance of Spanish interpreter Jimmy Olson)  Subjective   Currently no chest pain, dizziness dyspnea.  No orthopnea.  No edema.  Some nausea.  Somewhat anxious, awaiting dialysis.  Inpatient Medications    Scheduled Meds:  aspirin EC  81 mg Oral Daily   atorvastatin  80 mg Oral Daily   carvedilol  6.25 mg Oral BID   Chlorhexidine Gluconate Cloth  6 each Topical Q0600   docusate sodium  100 mg Oral BID   furosemide  40 mg Intravenous BID   hydrALAZINE  50 mg Oral BID   insulin aspart  0-15 Units Subcutaneous TID WC   nicotine  14 mg Transdermal Daily   sodium chloride flush  3 mL Intravenous Q12H   Continuous Infusions:  heparin 1,050 Units/hr (06/02/21 0000)   nitroGLYCERIN 100 mcg/min (06/02/21 0640)   PRN Meds: acetaminophen **OR** acetaminophen, albuterol, hydrALAZINE, morphine injection, ondansetron **OR** ondansetron (ZOFRAN) IV, oxyCODONE, polyethylene glycol   Vital Signs    Vitals:   06/02/21 0531 06/02/21 0545 06/02/21 0600 06/02/21 0615  BP: (!) 155/78 (!) 156/71 (!) 162/82 (!) 155/77  Pulse: 84 79 82 82  Resp: 19 15 19 16   Temp:      TempSrc:      SpO2: 100% 100% 100% 100%  Weight:  65.9 kg    Height:        Intake/Output Summary (Last 24 hours) at  06/02/2021 0955 Last data filed at 06/02/2021 0925 Gross per 24 hour  Intake 660.92 ml  Output 2000 ml  Net -1339.08 ml   Last 3 Weights 06/02/2021 05/31/2021 02/28/2021  Weight (lbs) 145 lb 3.2 oz 165 lb 5.5 oz 156 lb  Weight (kg) 65.862 kg 75 kg 70.761 kg      Telemetry    Sinus rhythm with heart rate in the 60s 80s.- Personally Reviewed  ECG    No new EKG- Personally Reviewed  Physical Exam   GEN: No acute distress.  Resting comfortably in bed Neck: No JVD or HJR. Cardiac: RRR, normal S1-S2.  No M/R/G.   Respiratory: Clear to auscultation bilaterally. GI  Nonlabored, good air movement.: Soft, nontender, non-distended  MS: No edema; No deformity. Neuro:  Nonfocal  Psych: Normal affect   Labs    High Sensitivity Troponin:   Recent Labs  Lab 05/31/21 2348 06/01/21 0204 06/01/21 0445 06/01/21 0746 06/01/21 1002  TROPONINIHS 370* 1,372* 2,862* 3,611* 4,844*     Chemistry Recent Labs  Lab 05/31/21 2210 06/01/21 0026 06/01/21 0030 06/02/21 0705  NA 136  --   --  137  K 5.3*  --   --  5.1  CL 106  --   --  106  CO2 21*  --   --  23  GLUCOSE 295*  --   --  194*  BUN 69*  --   --  76*  CREATININE 4.61*  --  4.46* 4.69*  CALCIUM 9.0  --   --  9.1  MG  --  1.9  --   --   GFRNONAA 14*  --  15* 14*  ANIONGAP 9  --   --  8    Lipids No results for input(s): CHOL, TRIG, HDL, LABVLDL, LDLCALC, CHOLHDL in the last 168 hours.  Hematology Recent Labs  Lab 06/01/21 0204 06/01/21 0445 06/02/21 0705  WBC 8.2 7.0 5.1  RBC 2.67* 2.53* 2.62*  HGB 8.0* 7.5* 7.8*  HCT 24.1* 22.6* 22.9*  MCV 90.3 89.3 87.4  MCH 30.0 29.6 29.8  MCHC 33.2 33.2 34.1  RDW 12.1 12.1 12.1  PLT 183 181 196   Thyroid No results for input(s): TSH, FREET4 in the last 168 hours.  BNP Recent Labs  Lab 05/31/21 2210  BNP 1,820.5*    DDimer No results for input(s): DDIMER in the last 168 hours.   Radiology    [place] UE VEIN MAPPING LEFT (PRE-OP AVF)  Result Date: 06/02/2021 CLINICAL  DATA:  Preop evaluation for AV fistula creation EXAM: [place] EXTREM UP VEIN MAPPING COMPARISON:  None. FINDINGS: LEFT ARTERIES Wrist Radial Artery: Size 2.68mm Waveform triphasic Wrist Ulnar Artery: Size 2.27mm Waveform triphasic Prox. Forearm Radial Artery: Size 3.5mm Waveform triphasic Upper Arm Brachial Artery: Size 4.58mm Waveform triphasic LEFT VEINS Forearm Cephalic Vein: Prox 6.7YP Distal 2.51mm Depth 9.5KD Upper Arm Cephalic Vein: Prox 3.2IZ Distal 4.85mm Depth 1.2-4.5YK Upper Arm Basilic Vein: Prox 9.9IP Distal 3.30mm Depth 3.9-8.9mm Upper Arm Brachial Vein: Prox 4.86mm Distal 3.38mm Depth 8.5-14.48mm ADDITIONAL LEFT VEINS Axillary Vein:  6.28mm (occluded, noncompressible) Subclavian Vein: Patient: Yes Respiratory Phasicity: Present Internal Jugular Vein: Patent: Yes    Respiratory Phasicity: Present IMPRESSION: 1. DVT noted within the left axillary vein. 2. Left upper extremity vasculature measurements as above. These results will be called to the ordering clinician or representative by the Radiologist Assistant, and communication documented in the PACS or [employer]. Electronically Signed   By: Miachel Roux M.D.   On: 06/02/2021 09:27   DG Chest Portable 1 View  Result Date: 05/31/2021 CLINICAL DATA:  Respiratory distress and shortness of breath. EXAM: PORTABLE CHEST 1 VIEW COMPARISON:  PA Lat chest and chest CT both 12/23/2020 FINDINGS: Mild cardiomegaly is noted with increased central vascular distension and flow cephalization and diffuse increased septal lines consistent with interstitial edema with minimal pleural effusions beginning to develop. There is faint perihilar interstitial haziness which is probably due to ground-glass edema , pneumonitis possible but less likely. No focal consolidation is seen. There is thoracic spondylosis. IMPRESSION: Cardiomegaly with CHF pattern including generalized interstitial edema, perihilar haziness which is probably ground-glass edema and minimal pleural effusions  forming. Clinical correlation and radiographic follow-up recommended. Electronically Signed   By: Telford Nab M.D.   On: 05/31/2021 22:13    Cardiac Studies   Cardiac Cath January 2022: pRCA 20%, mRCA 30%, dRCA 50% involving 50% rPAV. pLAD ~40% &D1 70%, mLAD 60% (not flow limiting). RHC - mildly elevated filling pressures - mild pulm HTN with normal CO/CI.   Echo completed - not yet read -> EF appears to be notably improved compared with 30 to 35% seen in January 2022.  No obvious wall motion normalities.  Does berry to be somewhat dilated, but EF at least 45%, visual estimation.  Still appears to have GRII DD.   Assessment & Plan    Acute on chronic combined CHF: Echo pending, but EF appears to  be better than it had been. Currently on IV Lasix volume control seem to be hemodialysis.   On carvedilol and hydralazine plus nitrate.  Blood pressure still adequately controlled, can probably titrate up both.    Troponin elevation significant please consider possible non-STEMI versus demand ischemia. Faythe Dingwall moderate nonocclusive CAD. On IV heparin for at least 48 hours. Currently on IV NTG. Delaying catheterization until dialysis initiated. Potentially consider cardiac catheterization tomorrow if dialysis started today or tomorrow Aspirin, hold off on Thienopyridine pending HD cath placement and cardiac catheterization. HLD: High-dose atorvastatin  CKD-5 now ESRD.  Plans for HD cath placement today followed by initiation of HD.  (We will hold off on cardiac catheterization until confirmation of dialysis being performed)    Shared Decision Making/Informed Consent The risks [stroke (1 in 1000), death (1 in 1000), kidney failure [usually temporary] (1 in 500), bleeding (1 in 200), allergic reaction [possibly serious] (1 in 200)], benefits (diagnostic support and management of coronary artery disease) and alternatives of a cardiac catheterization were discussed in detail with Mr. Nelles and he is  willing to proceed.    For questions or updates, please contact [place] Please consult [email or web address] for contact info under        Signed, Glenetta Hew, MD  06/02/2021, 9:55 AM

## 2021-06-02 NOTE — Progress Notes (Signed)
PT Cancellation Note  Patient Details Name: ALIE HARDGROVE MRN: [id number] DOB: 1960-12-24   Cancelled Treatment:    Reason Eval/Treat Not Completed: Patient at procedure or test/unavailable Pt out of room much of the day having dialysis catheter placed and then on to dialysis.  Kreg Shropshire, DPT 06/02/2021, 5:16 PM

## 2021-06-02 NOTE — Progress Notes (Signed)
*  PRELIMINARY RESULTS* Echocardiogram 2D Echocardiogram has been performed.  Jimmy Olson 06/02/2021, 7:31 AM

## 2021-06-02 NOTE — Progress Notes (Signed)
PROGRESS NOTE    Jimmy Olson  DDU:[id number] DOB: 25-Jun-1960 DOA: 05/31/2021 PCP: [facility] [place]    Brief Narrative:   60 y.o. male seen in ed with complaints of shortness of breath that has been going on since earlier today.  Has progressively gotten worse throughout the day.  EMS was called and while attempting to provide supplemental oxygen patient did not tolerate the facemask at that point reported shortness of breath.  Patient denies any other complaints of headache nausea diarrhea fevers chills abdominal pain chest pain palpitations.  Seen in consultation by cardiology and nephrology.  Per conversation with nephrology patient is known to their practice and kidney function has been deteriorating.  Decision was made after speaking with patient and family to initiate hemodialysis.  Vascular surgery involved in care.  Plan for catheter placement and initiation of HD 12/15   Assessment & Plan:   Principal Problem:   SOB (shortness of breath) Active Problems:   AKI (acute kidney injury) ([place])   Acute on chronic HFrEF (heart failure with reduced ejection fraction) (HCC)   Demand ischemia (HCC)   Anemia   Acute decompensated heart failure (HCC)  Acute on chronic systolic and diastolic congestive heart failure Acute hypoxic respiratory failure secondary to above Patient with known global heart failure Required BiPAP on admission Able to wean off of BiPAP 12/14 Respiratory status is improved Plan: continue IV loop diuretic for now pending dialysis initiation Monitor kidney function and UOP Weaned off BiPAP, nasal cannula as needed Strict I's and O's Daily weights Nephrology consulted Cardiology consulted Continue beta-blocker and hydralazine.  Uptitrate as tolerated  Hypertensive urgency/emergency Currently on IV nitroglycerin Continue Coreg and hydralazine Dose of Coreg increased to 12.5 twice daily Can likely increase hydralazine as well Appreciate  further cardiology recommendations  Right axillary DVT Noted on ultrasound vein mapping Notified vascular surgery nephrology following.  Plan for PermCath placement and initiation of HD today  NSTEMI Uptrending troponins in the setting of decompensated heart failure On heparin gtt., will continue  Acute kidney injury on chronic kidney disease stage IV Patient known to the nephrology service I have explained the patient's kidney function has deteriorated to the point he will require hemodialysis Patient is agreeable Plan: Nephrology consultation Vascular consultation for dialysis access placement  Anemia of chronic kidney disease Hemoglobin stable Currently no indication for transfusion   DVT prophylaxis: Heparin GTT Code Status: Full Family Communication: Attempted to call spouse Jimmy Olson [phone] on 12/14.  No answer.  Voicemail not left.  Discussed with spouse at bedside 12/15 Disposition Plan: Status is: Inpatient  Remains inpatient appropriate because: Acute decompensated heart failure  Level of care: Progressive  Consultants:  Cardiology Nephrology Vascular surgery  Procedures:  None  Antimicrobials: None   Subjective: Seen and examined.  Nervous about pending procedures.  Shortness of breath improved  Objective: Vitals:   06/02/21 0900 06/02/21 1009 06/02/21 1100 06/02/21 1101  BP: (!) 157/84 (!) 168/81 117/65 117/65  Pulse: 77 80 80 83  Resp: 18  18   Temp:    98.5 F (36.9 C)  TempSrc:      SpO2: 100%  100%   Weight:      Height:        Intake/Output Summary (Last 24 hours) at 06/02/2021 1208 Last data filed at 06/02/2021 1157 Gross per 24 hour  Intake 660.92 ml  Output 2200 ml  Net -1539.08 ml   Filed Weights   05/31/21 2207 06/02/21 0545  Weight:  75 kg 65.9 kg    Examination:  General exam: No apparent distress.  Appears nervous Respiratory system: Bibasilar crackles.  Normal work of breathing.  4 L Cardiovascular system: S1-S2,  RRR, no murmurs, 1+ pitting edema Gastrointestinal system: Soft, NT/ND, normal bowel sounds Central nervous system: Alert and oriented. No focal neurological deficits. Extremities: Symmetric 5 x 5 power. Skin: No rashes, lesions or ulcers Psychiatry: Judgement and insight appear normal. Mood & affect appropriate.     Data Reviewed: I have personally reviewed following labs and imaging studies  CBC: Recent Labs  Lab 05/31/21 2210 06/01/21 0204 06/01/21 0445 06/02/21 0705  WBC 8.9 8.2 7.0 5.1  NEUTROABS 4.9  --   --   --   HGB 9.5* 8.0* 7.5* 7.8*  HCT 28.6* 24.1* 22.6* 22.9*  MCV 91.4 90.3 89.3 87.4  PLT 231 183 181 366   Basic Metabolic Panel: Recent Labs  Lab 05/31/21 2210 06/01/21 0026 06/01/21 0030 06/02/21 0705  NA 136  --   --  137  K 5.3*  --   --  5.1  CL 106  --   --  106  CO2 21*  --   --  23  GLUCOSE 295*  --   --  194*  BUN 69*  --   --  76*  CREATININE 4.61*  --  4.46* 4.69*  CALCIUM 9.0  --   --  9.1  MG  --  1.9  --   --    GFR: Estimated Creatinine Clearance: 14.4 mL/min (A) (by C-G formula based on SCr of 4.69 mg/dL (H)). Liver Function Tests: No results for input(s): AST, ALT, ALKPHOS, BILITOT, PROT, ALBUMIN in the last 168 hours. No results for input(s): LIPASE, AMYLASE in the last 168 hours. No results for input(s): AMMONIA in the last 168 hours. Coagulation Profile: Recent Labs  Lab 06/01/21 0204  INR 1.1   Cardiac Enzymes: No results for input(s): CKTOTAL, CKMB, CKMBINDEX, TROPONINI in the last 168 hours. BNP (last 3 results) No results for input(s): PROBNP in the last 8760 hours. HbA1C: Recent Labs    06/01/21 0204  HGBA1C 8.7*   CBG: Recent Labs  Lab 06/01/21 1615 06/01/21 2033 06/02/21 0537 06/02/21 0943 06/02/21 1138  GLUCAP 156* 217* 153* 187* 185*   Lipid Profile: No results for input(s): CHOL, HDL, LDLCALC, TRIG, CHOLHDL, LDLDIRECT in the last 72 hours. Thyroid Function Tests: No results for input(s): TSH,  T4TOTAL, FREET4, T3FREE, THYROIDAB in the last 72 hours. Anemia Panel: No results for input(s): VITAMINB12, FOLATE, FERRITIN, TIBC, IRON, RETICCTPCT in the last 72 hours. Sepsis Labs: No results for input(s): PROCALCITON, LATICACIDVEN in the last 168 hours.  Recent Results (from the past 240 hour(s))  Resp Panel by RT-PCR (Flu A&B, Covid) Nasopharyngeal Swab     Status: None   Collection Time: 05/31/21 10:10 PM   Specimen: Nasopharyngeal Swab; Nasopharyngeal(NP) swabs in vial transport medium  Result Value Ref Range Status   SARS Coronavirus 2 by RT PCR NEGATIVE NEGATIVE Final    Comment: (NOTE) SARS-CoV-2 target nucleic acids are NOT DETECTED.  The SARS-CoV-2 RNA is generally detectable in upper respiratory specimens during the acute phase of infection. The lowest concentration of SARS-CoV-2 viral copies this assay can detect is 138 copies/mL. A negative result does not preclude SARS-Cov-2 infection and should not be used as the sole basis for treatment or other patient management decisions. A negative result may occur with  improper specimen collection/handling, submission of specimen other than nasopharyngeal  swab, presence of viral mutation(s) within the areas targeted by this assay, and inadequate number of viral copies(<138 copies/mL). A negative result must be combined with clinical observations, patient history, and epidemiological information. The expected result is Negative.  Fact Sheet for Patients:  EntrepreneurPulse.com.au  Fact Sheet for Healthcare Providers:  IncredibleEmployment.be  This test is no t yet approved or cleared by the [place] FDA and  has been authorized for detection and/or diagnosis of SARS-CoV-2 by FDA under an Emergency Use Authorization (EUA). This EUA will remain  in effect (meaning this test can be used) for the duration of the COVID-19 declaration under Section 564(b)(1) of the Act, 21 U.S.C.section  360bbb-3(b)(1), unless the authorization is terminated  or revoked sooner.       Influenza A by PCR NEGATIVE NEGATIVE Final   Influenza B by PCR NEGATIVE NEGATIVE Final    Comment: (NOTE) The Xpert Xpress SARS-CoV-2/FLU/RSV plus assay is intended as an aid in the diagnosis of influenza from Nasopharyngeal swab specimens and should not be used as a sole basis for treatment. Nasal washings and aspirates are unacceptable for Xpert Xpress SARS-CoV-2/FLU/RSV testing.  Fact Sheet for Patients: EntrepreneurPulse.com.au  Fact Sheet for Healthcare Providers: IncredibleEmployment.be  This test is not yet approved or cleared by the [place] FDA and has been authorized for detection and/or diagnosis of SARS-CoV-2 by FDA under an Emergency Use Authorization (EUA). This EUA will remain in effect (meaning this test can be used) for the duration of the COVID-19 declaration under Section 564(b)(1) of the Act, 21 U.S.C. section 360bbb-3(b)(1), unless the authorization is terminated or revoked.  Performed at [facility], [address] [place], [place] [address]          Radiology Studies: [place] UE VEIN MAPPING LEFT (PRE-OP AVF)  Result Date: 06/02/2021 CLINICAL DATA:  Preop evaluation for AV fistula creation EXAM: [place] EXTREM UP VEIN MAPPING COMPARISON:  None. FINDINGS: LEFT ARTERIES Wrist Radial Artery: Size 2.67mm Waveform triphasic Wrist Ulnar Artery: Size 2.42mm Waveform triphasic Prox. Forearm Radial Artery: Size 3.26mm Waveform triphasic Upper Arm Brachial Artery: Size 4.45mm Waveform triphasic LEFT VEINS Forearm Cephalic Vein: Prox 6.4PP Distal 2.42mm Depth 2.9JJ Upper Arm Cephalic Vein: Prox 8.8CZ Distal 4.7mm Depth 6.6-0.6TK Upper Arm Basilic Vein: Prox 1.6WF Distal 3.32mm Depth 3.9-8.60mm Upper Arm Brachial Vein: Prox 4.73mm Distal 3.32mm Depth 8.5-14.60mm ADDITIONAL LEFT VEINS Axillary Vein:  6.57mm (occluded, noncompressible) Subclavian Vein:  Patient: Yes Respiratory Phasicity: Present Internal Jugular Vein: Patent: Yes    Respiratory Phasicity: Present IMPRESSION: 1. DVT noted within the left axillary vein. 2. Left upper extremity vasculature measurements as above. These results will be called to the ordering clinician or representative by the Radiologist Assistant, and communication documented in the PACS or [employer]. Electronically Signed   By: Miachel Roux M.D.   On: 06/02/2021 09:27   DG Chest Portable 1 View  Result Date: 05/31/2021 CLINICAL DATA:  Respiratory distress and shortness of breath. EXAM: PORTABLE CHEST 1 VIEW COMPARISON:  PA Lat chest and chest CT both 12/23/2020 FINDINGS: Mild cardiomegaly is noted with increased central vascular distension and flow cephalization and diffuse increased septal lines consistent with interstitial edema with minimal pleural effusions beginning to develop. There is faint perihilar interstitial haziness which is probably due to ground-glass edema , pneumonitis possible but less likely. No focal consolidation is seen. There is thoracic spondylosis. IMPRESSION: Cardiomegaly with CHF pattern including generalized interstitial edema, perihilar haziness which is probably ground-glass edema and minimal pleural effusions forming. Clinical  correlation and radiographic follow-up recommended. Electronically Signed   By: Telford Nab M.D.   On: 05/31/2021 22:13   ECHOCARDIOGRAM COMPLETE  Result Date: 06/02/2021    ECHOCARDIOGRAM REPORT   Patient Name:   EZELL POKE Date of Exam: 06/02/2021 Medical Rec #:  [id number]     Height:       62.0 in Accession #:    [id number]    Weight:       145.2 lb Date of Birth:  1960-12-29     BSA:          1.668 m Patient Age:    45 years      BP:           155/77 mmHg Patient Gender: M             HR:           82 bpm. Exam Location:  [facility] Procedure: 2D Echo, Cardiac Doppler and Color Doppler Indications:     Cardiac arrest I 46.9  History:         Patient has no  prior history of Echocardiogram examinations.                  CHF; Risk Factors:Hypertension and Diabetes.  Sonographer:     Sherrie Sport Referring Phys:  [place] Diagnosing Phys: Ida Rogue MD IMPRESSIONS  1. Left ventricular ejection fraction, by estimation, is 50 to 55%. The left ventricle has low normal function. The left ventricle has no regional wall motion abnormalities. There is mild left ventricular hypertrophy. Left ventricular diastolic parameters are consistent with Grade I diastolic dysfunction (impaired relaxation).  2. Right ventricular systolic function is normal. The right ventricular size is normal. Tricuspid regurgitation signal is inadequate for assessing PA pressure.  3. The mitral valve is normal in structure. Mild mitral valve regurgitation. No evidence of mitral stenosis.  4. The aortic valve was not well visualized. Aortic valve regurgitation is not visualized. No aortic stenosis is present. FINDINGS  Left Ventricle: Left ventricular ejection fraction, by estimation, is 50 to 55%. The left ventricle has low normal function. The left ventricle has no regional wall motion abnormalities. The left ventricular internal cavity size was normal in size. There is mild left ventricular hypertrophy. Left ventricular diastolic parameters are consistent with Grade I diastolic dysfunction (impaired relaxation). Right Ventricle: The right ventricular size is normal. No increase in right ventricular wall thickness. Right ventricular systolic function is normal. Tricuspid regurgitation signal is inadequate for assessing PA pressure. Left Atrium: Left atrial size was normal in size. Right Atrium: Right atrial size was normal in size. Pericardium: There is no evidence of pericardial effusion. Mitral Valve: The mitral valve is normal in structure. Mild mitral valve regurgitation. No evidence of mitral valve stenosis. MV peak gradient, 6.0 mmHg. The mean mitral valve gradient is 2.0 mmHg.  Tricuspid Valve: The tricuspid valve is normal in structure. Tricuspid valve regurgitation is not demonstrated. No evidence of tricuspid stenosis. Aortic Valve: The aortic valve was not well visualized. Aortic valve regurgitation is not visualized. No aortic stenosis is present. Aortic valve mean gradient measures 5.0 mmHg. Aortic valve peak gradient measures 7.7 mmHg. Aortic valve area, by VTI measures 1.90 cm. Pulmonic Valve: The pulmonic valve was normal in structure. Pulmonic valve regurgitation is not visualized. No evidence of pulmonic stenosis. Aorta: The aortic root is normal in size and structure. Venous: The pulmonary veins were not well visualized. The inferior vena cava was not  well visualized. IAS/Shunts: No atrial level shunt detected by color flow Doppler.  LEFT VENTRICLE PLAX 2D LVIDd:         5.30 cm   Diastology LVIDs:         3.80 cm   LV e' medial:    3.15 cm/s LV PW:         1.70 cm   LV E/e' medial:  25.0 LV IVS:        1.05 cm   LV e' lateral:   4.46 cm/s LVOT diam:     2.00 cm   LV E/e' lateral: 17.7 LV SV:         54 LV SV Index:   32 LVOT Area:     3.14 cm  RIGHT VENTRICLE RV Basal diam:  4.30 cm RV S prime:     14.30 cm/s TAPSE (M-mode): 4.5 cm LEFT ATRIUM             Index        RIGHT ATRIUM           Index LA diam:        3.60 cm 2.16 cm/m   RA Area:     13.50 cm LA Vol (A2C):   66.7 ml 39.98 ml/m  RA Volume:   32.50 ml  19.48 ml/m LA Vol (A4C):   66.8 ml 40.04 ml/m LA Biplane Vol: 67.3 ml 40.34 ml/m  AORTIC VALVE                     PULMONIC VALVE AV Area (Vmax):    1.98 cm      PV Vmax:        0.96 m/s AV Area (Vmean):   1.92 cm      PV Vmean:       59.600 cm/s AV Area (VTI):     1.90 cm      PV VTI:         0.157 m AV Vmax:           139.00 cm/s   PV Peak grad:   3.7 mmHg AV Vmean:          103.000 cm/s  PV Mean grad:   2.0 mmHg AV VTI:            0.284 m       RVOT Peak grad: 4 mmHg AV Peak Grad:      7.7 mmHg AV Mean Grad:      5.0 mmHg LVOT Vmax:         87.50 cm/s LVOT  Vmean:        62.900 cm/s LVOT VTI:          0.172 m LVOT/AV VTI ratio: 0.61  AORTA Ao Root diam: 3.20 cm MITRAL VALVE                TRICUSPID VALVE MV Area (PHT): 3.46 cm     TR Peak grad:   14.3 mmHg MV Area VTI:   2.57 cm     TR Vmax:        189.00 cm/s MV Peak grad:  6.0 mmHg MV Mean grad:  2.0 mmHg     SHUNTS MV Vmax:       1.22 m/s     Systemic VTI:  0.17 m MV Vmean:      64.9 cm/s    Systemic Diam: 2.00 cm MV Decel Time: 219 msec     Pulmonic VTI:  0.168 m MV E velocity: 78.80 cm/s MV A velocity: 109.00 cm/s MV E/A ratio:  0.72 Ida Rogue MD Electronically signed by Ida Rogue MD Signature Date/Time: 06/02/2021/10:15:30 AM    Final         Scheduled Meds:  aspirin EC  81 mg Oral Daily   atorvastatin  80 mg Oral Daily   carvedilol  6.25 mg Oral BID   Chlorhexidine Gluconate Cloth  6 each Topical Q0600   docusate sodium  100 mg Oral BID   furosemide  40 mg Intravenous BID   hydrALAZINE  50 mg Oral BID   insulin aspart  0-15 Units Subcutaneous TID WC   sodium chloride flush  3 mL Intravenous Q12H   Continuous Infusions:  sodium chloride     [START ON 06/03/2021]  ceFAZolin (ANCEF) IV     heparin 1,050 Units/hr (06/02/21 0000)   nitroGLYCERIN 133.333 mcg/min (06/02/21 1008)     LOS: 1 day    Time spent: 25 minutes    Sidney Ace, MD [facility]   If 7PM-7AM, please contact night-coverage  06/02/2021, 12:08 PM

## 2021-06-02 NOTE — Interval H&P Note (Signed)
History and Physical Interval Note:  06/02/2021 1:16 PM  Jimmy Olson  has presented today for surgery, with the diagnosis of ESRD.  The various methods of treatment have been discussed with the patient and family. After consideration of risks, benefits and other options for treatment, the patient has consented to  Procedure(s): DIALYSIS/PERMA CATHETER INSERTION (N/A) as a surgical intervention.  The patient's history has been reviewed, patient examined, no change in status, stable for surgery.  I have reviewed the patient's chart and labs.  Questions were answered to the patient's satisfaction.     Leotis Pain

## 2021-06-02 NOTE — Progress Notes (Signed)
OT Cancellation Note  Patient Details Name: Jimmy Olson MRN: [id number] DOB: 1960/10/30   Cancelled Treatment:    Reason Eval/Treat Not Completed: Patient at procedure or test/ unavailable. Order received, chart reviewed. Per chart review, pt at ultrasound for HD catheter placement. Will follow and initiate services at later time/date as available.  Nino Glow, OTS   Nino Glow 06/02/2021, 8:50 AM

## 2021-06-02 NOTE — Op Note (Signed)
OPERATIVE NOTE    PRE-OPERATIVE DIAGNOSIS: 1. ESRD   POST-OPERATIVE DIAGNOSIS: same as above  PROCEDURE: Ultrasound guidance for vascular access to the right internal jugular vein Fluoroscopic guidance for placement of catheter Placement of a 19 cm tip to cuff tunneled hemodialysis catheter via the right internal jugular vein  SURGEON: Leotis Pain, MD  ANESTHESIA:  Local with Moderate conscious sedation for approximately 19 minutes using 2 mg of Versed and 50 mcg of Fentanyl  ESTIMATED BLOOD LOSS: 15 cc  FLUORO TIME: less than one minute  CONTRAST: none  FINDING(S): 1.  Patent right internal jugular vein  SPECIMEN(S):  None  INDICATIONS:   Jimmy Olson is a 60 y.o.male who presents with renal failure.  The patient needs long term dialysis access for their ESRD, and a Permcath is necessary.  Risks and benefits are discussed and informed consent is obtained.    DESCRIPTION: After obtaining full informed written consent, the patient was brought back to the vascular suited. The patient's right neck and chest were sterilely prepped and draped in a sterile surgical field was created. Moderate conscious sedation was administered during a face to face encounter with the patient throughout the procedure with my supervision of the RN administering medicines and monitoring the patient's vital signs, pulse oximetry, telemetry and mental status throughout from the start of the procedure until the patient was taken to the recovery room.  The right internal jugular vein was visualized with ultrasound and found to be patent. It was then accessed under direct ultrasound guidance and a permanent image was recorded. A wire was placed. After skin nick and dilatation, the peel-away sheath was placed over the wire. I then turned my attention to an area under the clavicle. Approximately 1-2 fingerbreadths below the clavicle a small counterincision was created and tunneled from the subclavicular incision to  the access site. Using fluoroscopic guidance, a 19 centimeter tip to cuff tunneled hemodialysis catheter was selected, and tunneled from the subclavicular incision to the access site. It was then placed through the peel-away sheath and the peel-away sheath was removed. Using fluoroscopic guidance the catheter tips were parked in the right atrium. The appropriate distal connectors were placed. It withdrew blood well and flushed easily with heparinized saline and a concentrated heparin solution was then placed. It was secured to the chest wall with 2 Prolene sutures. The access incision was closed single 4-0 Monocryl. A 4-0 Monocryl pursestring suture was placed around the exit site. Sterile dressings were placed. The patient tolerated the procedure well and was taken to the recovery room in stable condition.  COMPLICATIONS: None  CONDITION: Stable  Leotis Pain, MD 06/02/2021 2:47 PM   This note was created with Dragon Medical transcription system. Any errors in dictation are purely unintentional.

## 2021-06-02 NOTE — Progress Notes (Signed)
[facility]  ROUNDING NOTE   Subjective:   Jimmy Olson is a 60 year old male with a past medical history consisting of coronary artery disease, diabetes, hypertension, chronic kidney disease stage IV.  He presents to the emergency department with complaints of shortness of breath.  He will be admitted for SOB (shortness of breath) [R06.02] Kidney damage [S37.009A] Acute decompensated heart failure (HCC) [I50.9] Hypertension, unspecified type [I10] Congestive heart failure, unspecified HF chronicity, unspecified heart failure type [facility]) [I50.9]   Patient is known to our office and is followed by Dr. Candiss Norse.  Patient was last seen in the office on November 10 of this year.    Patient seen today after [place] vein mapping Currently NPO for permcath placement Denies pain and discomfort   Objective:  Vital signs in last 24 hours:  Temp:  [97.6 F (36.4 C)-98.5 F (36.9 C)] 98.2 F (36.8 C) (12/15 1250) Pulse Rate:  [71-89] 81 (12/15 1250) Resp:  [9-20] 18 (12/15 1250) BP: (117-189)/(64-94) 118/80 (12/15 1450) SpO2:  [94 %-100 %] 96 % (12/15 1250) Weight:  [65.9 kg] 65.9 kg (12/15 1250)  Weight change: -9.138 kg Filed Weights   05/31/21 2207 06/02/21 0545  Weight: 75 kg 65.9 kg    Intake/Output: I/O last 3 completed shifts: In: 660.9 [I.V.:660.9] Out: 2851 [Urine:2850; Stool:1]   Intake/Output this shift:  Total I/O In: -  Out: 700 [Urine:700]  Physical Exam: General: NAD, resting in bed  Head: Normocephalic, atraumatic. Moist oral mucosal membranes  Eyes: Anicteric  Lungs:  Crackles bilaterally, mild tachypnea, 3 L [place]  Heart: Regular rate and rhythm  Abdomen:  Soft, nontender  Extremities:  no peripheral edema.  Neurologic: Nonfocal, moving all four extremities  Skin: No lesions  Access: No access    Basic Metabolic Panel: Recent Labs  Lab 05/31/21 2210 06/01/21 0026 06/01/21 0030 06/02/21 0705  NA 136  --   --  137  K 5.3*  --   --  5.1  CL  106  --   --  106  CO2 21*  --   --  23  GLUCOSE 295*  --   --  194*  BUN 69*  --   --  76*  CREATININE 4.61*  --  4.46* 4.69*  CALCIUM 9.0  --   --  9.1  MG  --  1.9  --   --      Liver Function Tests: No results for input(s): AST, ALT, ALKPHOS, BILITOT, PROT, ALBUMIN in the last 168 hours. No results for input(s): LIPASE, AMYLASE in the last 168 hours. No results for input(s): AMMONIA in the last 168 hours.  CBC: Recent Labs  Lab 05/31/21 2210 06/01/21 0204 06/01/21 0445 06/02/21 0705  WBC 8.9 8.2 7.0 5.1  NEUTROABS 4.9  --   --   --   HGB 9.5* 8.0* 7.5* 7.8*  HCT 28.6* 24.1* 22.6* 22.9*  MCV 91.4 90.3 89.3 87.4  PLT 231 183 181 196     Cardiac Enzymes: No results for input(s): CKTOTAL, CKMB, CKMBINDEX, TROPONINI in the last 168 hours.  BNP: Invalid input(s): POCBNP  CBG: Recent Labs  Lab 06/01/21 1615 06/01/21 2033 06/02/21 0537 06/02/21 0943 06/02/21 1138  GLUCAP 156* 217* 153* 187* 185*     Microbiology: Results for orders placed or performed during the hospital encounter of 05/31/21  Resp Panel by RT-PCR (Flu A&B, Covid) Nasopharyngeal Swab     Status: None   Collection Time: 05/31/21 10:10 PM   Specimen:  Nasopharyngeal Swab; Nasopharyngeal(NP) swabs in vial transport medium  Result Value Ref Range Status   SARS Coronavirus 2 by RT PCR NEGATIVE NEGATIVE Final    Comment: (NOTE) SARS-CoV-2 target nucleic acids are NOT DETECTED.  The SARS-CoV-2 RNA is generally detectable in upper respiratory specimens during the acute phase of infection. The lowest concentration of SARS-CoV-2 viral copies this assay can detect is 138 copies/mL. A negative result does not preclude SARS-Cov-2 infection and should not be used as the sole basis for treatment or other patient management decisions. A negative result may occur with  improper specimen collection/handling, submission of specimen other than nasopharyngeal swab, presence of viral mutation(s) within  the areas targeted by this assay, and inadequate number of viral copies(<138 copies/mL). A negative result must be combined with clinical observations, patient history, and epidemiological information. The expected result is Negative.  Fact Sheet for Patients:  EntrepreneurPulse.com.au  Fact Sheet for Healthcare Providers:  IncredibleEmployment.be  This test is no t yet approved or cleared by the [place] FDA and  has been authorized for detection and/or diagnosis of SARS-CoV-2 by FDA under an Emergency Use Authorization (EUA). This EUA will remain  in effect (meaning this test can be used) for the duration of the COVID-19 declaration under Section 564(b)(1) of the Act, 21 U.S.C.section 360bbb-3(b)(1), unless the authorization is terminated  or revoked sooner.       Influenza A by PCR NEGATIVE NEGATIVE Final   Influenza B by PCR NEGATIVE NEGATIVE Final    Comment: (NOTE) The Xpert Xpress SARS-CoV-2/FLU/RSV plus assay is intended as an aid in the diagnosis of influenza from Nasopharyngeal swab specimens and should not be used as a sole basis for treatment. Nasal washings and aspirates are unacceptable for Xpert Xpress SARS-CoV-2/FLU/RSV testing.  Fact Sheet for Patients: EntrepreneurPulse.com.au  Fact Sheet for Healthcare Providers: IncredibleEmployment.be  This test is not yet approved or cleared by the [place] FDA and has been authorized for detection and/or diagnosis of SARS-CoV-2 by FDA under an Emergency Use Authorization (EUA). This EUA will remain in effect (meaning this test can be used) for the duration of the COVID-19 declaration under Section 564(b)(1) of the Act, 21 U.S.C. section 360bbb-3(b)(1), unless the authorization is terminated or revoked.  Performed at [facility], [address] [place], [place] [address]     Coagulation Studies: Recent Labs     06/01/21 0204  LABPROT 13.7  INR 1.1     Urinalysis: No results for input(s): COLORURINE, LABSPEC, PHURINE, GLUCOSEU, HGBUR, BILIRUBINUR, KETONESUR, PROTEINUR, UROBILINOGEN, NITRITE, LEUKOCYTESUR in the last 72 hours.  Invalid input(s): APPERANCEUR    Imaging: PERIPHERAL VASCULAR CATHETERIZATION  Result Date: 06/02/2021 See surgical note for result.  [place] UE VEIN MAPPING LEFT (PRE-OP AVF)  Result Date: 06/02/2021 CLINICAL DATA:  Preop evaluation for AV fistula creation EXAM: [place] EXTREM UP VEIN MAPPING COMPARISON:  None. FINDINGS: LEFT ARTERIES Wrist Radial Artery: Size 2.32mm Waveform triphasic Wrist Ulnar Artery: Size 2.77mm Waveform triphasic Prox. Forearm Radial Artery: Size 3.22mm Waveform triphasic Upper Arm Brachial Artery: Size 4.27mm Waveform triphasic LEFT VEINS Forearm Cephalic Vein: Prox 4.6FK Distal 2.17mm Depth 8.1EX Upper Arm Cephalic Vein: Prox 5.1ZG Distal 4.68mm Depth 0.1-7.4BS Upper Arm Basilic Vein: Prox 4.9QP Distal 3.69mm Depth 3.9-8.51mm Upper Arm Brachial Vein: Prox 4.72mm Distal 3.57mm Depth 8.5-14.83mm ADDITIONAL LEFT VEINS Axillary Vein:  6.93mm (occluded, noncompressible) Subclavian Vein: Patient: Yes Respiratory Phasicity: Present Internal Jugular Vein: Patent: Yes    Respiratory Phasicity: Present IMPRESSION: 1. DVT noted within the left  axillary vein. 2. Left upper extremity vasculature measurements as above. These results will be called to the ordering clinician or representative by the Radiologist Assistant, and communication documented in the PACS or [employer]. Electronically Signed   By: Miachel Roux M.D.   On: 06/02/2021 09:27   DG Chest Portable 1 View  Result Date: 05/31/2021 CLINICAL DATA:  Respiratory distress and shortness of breath. EXAM: PORTABLE CHEST 1 VIEW COMPARISON:  PA Lat chest and chest CT both 12/23/2020 FINDINGS: Mild cardiomegaly is noted with increased central vascular distension and flow cephalization and diffuse increased septal lines  consistent with interstitial edema with minimal pleural effusions beginning to develop. There is faint perihilar interstitial haziness which is probably due to ground-glass edema , pneumonitis possible but less likely. No focal consolidation is seen. There is thoracic spondylosis. IMPRESSION: Cardiomegaly with CHF pattern including generalized interstitial edema, perihilar haziness which is probably ground-glass edema and minimal pleural effusions forming. Clinical correlation and radiographic follow-up recommended. Electronically Signed   By: Telford Nab M.D.   On: 05/31/2021 22:13   ECHOCARDIOGRAM COMPLETE  Result Date: 06/02/2021    ECHOCARDIOGRAM REPORT   Patient Name:   Jimmy Olson Date of Exam: 06/02/2021 Medical Rec #:  [id number]     Height:       62.0 in Accession #:    [id number]    Weight:       145.2 lb Date of Birth:  Feb 13, 1961     BSA:          1.668 m Patient Age:    53 years      BP:           155/77 mmHg Patient Gender: M             HR:           82 bpm. Exam Location:  [facility] Procedure: 2D Echo, Cardiac Doppler and Color Doppler Indications:     Cardiac arrest I 46.9  History:         Patient has no prior history of Echocardiogram examinations.                  CHF; Risk Factors:Hypertension and Diabetes.  Sonographer:     Sherrie Sport Referring Phys:  [place] Diagnosing Phys: Ida Rogue MD IMPRESSIONS  1. Left ventricular ejection fraction, by estimation, is 50 to 55%. The left ventricle has low normal function. The left ventricle has no regional wall motion abnormalities. There is mild left ventricular hypertrophy. Left ventricular diastolic parameters are consistent with Grade I diastolic dysfunction (impaired relaxation).  2. Right ventricular systolic function is normal. The right ventricular size is normal. Tricuspid regurgitation signal is inadequate for assessing PA pressure.  3. The mitral valve is normal in structure. Mild mitral valve regurgitation. No evidence of  mitral stenosis.  4. The aortic valve was not well visualized. Aortic valve regurgitation is not visualized. No aortic stenosis is present. FINDINGS  Left Ventricle: Left ventricular ejection fraction, by estimation, is 50 to 55%. The left ventricle has low normal function. The left ventricle has no regional wall motion abnormalities. The left ventricular internal cavity size was normal in size. There is mild left ventricular hypertrophy. Left ventricular diastolic parameters are consistent with Grade I diastolic dysfunction (impaired relaxation). Right Ventricle: The right ventricular size is normal. No increase in right ventricular wall thickness. Right ventricular systolic function is normal. Tricuspid regurgitation signal is inadequate for assessing PA pressure. Left Atrium:  Left atrial size was normal in size. Right Atrium: Right atrial size was normal in size. Pericardium: There is no evidence of pericardial effusion. Mitral Valve: The mitral valve is normal in structure. Mild mitral valve regurgitation. No evidence of mitral valve stenosis. MV peak gradient, 6.0 mmHg. The mean mitral valve gradient is 2.0 mmHg. Tricuspid Valve: The tricuspid valve is normal in structure. Tricuspid valve regurgitation is not demonstrated. No evidence of tricuspid stenosis. Aortic Valve: The aortic valve was not well visualized. Aortic valve regurgitation is not visualized. No aortic stenosis is present. Aortic valve mean gradient measures 5.0 mmHg. Aortic valve peak gradient measures 7.7 mmHg. Aortic valve area, by VTI measures 1.90 cm. Pulmonic Valve: The pulmonic valve was normal in structure. Pulmonic valve regurgitation is not visualized. No evidence of pulmonic stenosis. Aorta: The aortic root is normal in size and structure. Venous: The pulmonary veins were not well visualized. The inferior vena cava was not well visualized. IAS/Shunts: No atrial level shunt detected by color flow Doppler.  LEFT VENTRICLE PLAX 2D LVIDd:          5.30 cm   Diastology LVIDs:         3.80 cm   LV e' medial:    3.15 cm/s LV PW:         1.70 cm   LV E/e' medial:  25.0 LV IVS:        1.05 cm   LV e' lateral:   4.46 cm/s LVOT diam:     2.00 cm   LV E/e' lateral: 17.7 LV SV:         54 LV SV Index:   32 LVOT Area:     3.14 cm  RIGHT VENTRICLE RV Basal diam:  4.30 cm RV S prime:     14.30 cm/s TAPSE (M-mode): 4.5 cm LEFT ATRIUM             Index        RIGHT ATRIUM           Index LA diam:        3.60 cm 2.16 cm/m   RA Area:     13.50 cm LA Vol (A2C):   66.7 ml 39.98 ml/m  RA Volume:   32.50 ml  19.48 ml/m LA Vol (A4C):   66.8 ml 40.04 ml/m LA Biplane Vol: 67.3 ml 40.34 ml/m  AORTIC VALVE                     PULMONIC VALVE AV Area (Vmax):    1.98 cm      PV Vmax:        0.96 m/s AV Area (Vmean):   1.92 cm      PV Vmean:       59.600 cm/s AV Area (VTI):     1.90 cm      PV VTI:         0.157 m AV Vmax:           139.00 cm/s   PV Peak grad:   3.7 mmHg AV Vmean:          103.000 cm/s  PV Mean grad:   2.0 mmHg AV VTI:            0.284 m       RVOT Peak grad: 4 mmHg AV Peak Grad:      7.7 mmHg AV Mean Grad:      5.0 mmHg LVOT Vmax:  87.50 cm/s LVOT Vmean:        62.900 cm/s LVOT VTI:          0.172 m LVOT/AV VTI ratio: 0.61  AORTA Ao Root diam: 3.20 cm MITRAL VALVE                TRICUSPID VALVE MV Area (PHT): 3.46 cm     TR Peak grad:   14.3 mmHg MV Area VTI:   2.57 cm     TR Vmax:        189.00 cm/s MV Peak grad:  6.0 mmHg MV Mean grad:  2.0 mmHg     SHUNTS MV Vmax:       1.22 m/s     Systemic VTI:  0.17 m MV Vmean:      64.9 cm/s    Systemic Diam: 2.00 cm MV Decel Time: 219 msec     Pulmonic VTI:  0.168 m MV E velocity: 78.80 cm/s MV A velocity: 109.00 cm/s MV E/A ratio:  0.72 Ida Rogue MD Electronically signed by Ida Rogue MD Signature Date/Time: 06/02/2021/10:15:30 AM    Final      Medications:    sodium chloride     sodium chloride     sodium chloride     [START ON 06/03/2021] sodium chloride     sodium chloride 10  mL/hr at 06/02/21 1310   heparin 1,050 Units/hr (06/02/21 1500)   nitroGLYCERIN 100 mcg/min (06/02/21 1500)    [MAR Hold] aspirin EC  81 mg Oral Daily   [MAR Hold] atorvastatin  80 mg Oral Daily   [MAR Hold] carvedilol  12.5 mg Oral BID   [MAR Hold] Chlorhexidine Gluconate Cloth  6 each Topical Q0600   [MAR Hold] docusate sodium  100 mg Oral BID   fentaNYL       [MAR Hold] furosemide  40 mg Intravenous BID   [MAR Hold] hydrALAZINE  50 mg Oral Q8H   [MAR Hold] insulin aspart  0-15 Units Subcutaneous TID WC   midazolam       [MAR Hold] sodium chloride flush  3 mL Intravenous Q12H   sodium chloride flush  3 mL Intravenous Q12H   sodium chloride, sodium chloride, sodium chloride, [MAR Hold] acetaminophen **OR** [MAR Hold] acetaminophen, [MAR Hold] albuterol, alteplase, diphenhydrAMINE, famotidine, fentaNYL, heparin, [MAR Hold] hydrALAZINE, [MAR Hold]  HYDROmorphone (DILAUDID) injection, lidocaine (PF), lidocaine-prilocaine, [MAR Hold] LORazepam, methylPREDNISolone (SOLU-MEDROL) injection, midazolam, midazolam, [MAR Hold]  morphine injection, [MAR Hold] ondansetron **OR** [MAR Hold] ondansetron (ZOFRAN) IV, [MAR Hold] ondansetron (ZOFRAN) IV, [MAR Hold] oxyCODONE, pentafluoroprop-tetrafluoroeth, [MAR Hold] polyethylene glycol, sodium chloride flush  Assessment/ Plan:  Jimmy Olson is a 60 y.o.  male ith a past medical history consisting of coronary artery disease, diabetes, hypertension, chronic kidney disease stage IV.  He presents to the emergency department with complaints of shortness of breath.  He will be admitted for SOB (shortness of breath) [R06.02] Kidney damage [S37.009A] Acute decompensated heart failure (HCC) [I50.9] Hypertension, unspecified type [I10] Congestive heart failure, unspecified HF chronicity, unspecified heart failure type ([place]) [I50.9]   End-stage renal disease requiring hemodialysis.  Based on chart review and recent office note.  We feel patient kidney disease  has progressed to stage V requiring initiation of renal replacement therapy.   - Appreciate vascular placing Permcath today.   - Will plan for first dialysis treatment after placement.   - dialysis coordinator will begin outpatient placement  2. Anemia of chronic kidney disease Lab Results  Component Value  Date   HGB 7.8 (L) 06/02/2021    Hemoglobin below goal. Will consider EPO with initiation of dialysis.  3. Secondary Hyperparathyroidism:  Lab Results  Component Value Date   CALCIUM 9.1 06/02/2021   PHOS 4.3 07/18/2020  Calcium and phosphorus at acceptable range  4. Diabetes mellitus type II with chronic kidney disease  insulin dependent. Home regimen includes NovoLog and Levemir. Most recent hemoglobin A1c is 10.3 on 12/24/20.  Glucose stable  5.  Hypertension with chronic kidney disease.  Home regimen includes amlodipine, carvedilol, isosorbide, hydralazine and torsemide.  Currently receiving carvedilol, furosemide, and hydralazine.  Current blood pressure 118/80  6.  Chronic systolic heart failure.  EF from July 16, 2020 shows EF 30 to 35% with mild LVH and a grade 2 diastolic dysfunction.   LOS: 1 [place] 12/15/20223:06 PM

## 2021-06-03 ENCOUNTER — Inpatient Hospital Stay
Admission: EM | Disposition: A | Discharge status: Home Health | Payer: Self-pay | Source: Home / Self Care | Attending: Internal Medicine

## 2021-06-03 DIAGNOSIS — R778 Other specified abnormalities of plasma proteins: Secondary | ICD-10-CM

## 2021-06-03 DIAGNOSIS — I2510 Atherosclerotic heart disease of native coronary artery without angina pectoris: Secondary | ICD-10-CM

## 2021-06-03 DIAGNOSIS — N186 End stage renal disease: Secondary | ICD-10-CM

## 2021-06-03 DIAGNOSIS — Z992 Dependence on renal dialysis: Secondary | ICD-10-CM

## 2021-06-03 HISTORY — PX: LEFT HEART CATH AND CORONARY ANGIOGRAPHY: CATH118249

## 2021-06-03 LAB — CBC WITH DIFFERENTIAL/PLATELET
Abs Immature Granulocytes: 0.01 10*3/uL (ref 0.00–0.07)
Basophils Absolute: 0 10*3/uL (ref 0.0–0.1)
Basophils Relative: 1 %
Eosinophils Absolute: 0.2 10*3/uL (ref 0.0–0.5)
Eosinophils Relative: 5 %
HCT: 23.5 % — ABNORMAL LOW (ref 39.0–52.0)
Hemoglobin: 8 g/dL — ABNORMAL LOW (ref 13.0–17.0)
Immature Granulocytes: 0 %
Lymphocytes Relative: 23 %
Lymphs Abs: 1.1 10*3/uL (ref 0.7–4.0)
MCH: 30 pg (ref 26.0–34.0)
MCHC: 34 g/dL (ref 30.0–36.0)
MCV: 88 fL (ref 80.0–100.0)
Monocytes Absolute: 0.6 10*3/uL (ref 0.1–1.0)
Monocytes Relative: 12 %
Neutro Abs: 2.8 10*3/uL (ref 1.7–7.7)
Neutrophils Relative %: 59 %
Platelets: 180 10*3/uL (ref 150–400)
RBC: 2.67 MIL/uL — ABNORMAL LOW (ref 4.22–5.81)
RDW: 12 % (ref 11.5–15.5)
WBC: 4.8 10*3/uL (ref 4.0–10.5)
nRBC: 0 % (ref 0.0–0.2)

## 2021-06-03 LAB — MAGNESIUM: Magnesium: 1.9 mg/dL (ref 1.7–2.4)

## 2021-06-03 LAB — GLUCOSE, CAPILLARY
Glucose-Capillary: 199 mg/dL — ABNORMAL HIGH (ref 70–99)
Glucose-Capillary: 234 mg/dL — ABNORMAL HIGH (ref 70–99)
Glucose-Capillary: 281 mg/dL — ABNORMAL HIGH (ref 70–99)

## 2021-06-03 LAB — BASIC METABOLIC PANEL
Anion gap: 7 (ref 5–15)
BUN: 50 mg/dL — ABNORMAL HIGH (ref 6–20)
CO2: 27 mmol/L (ref 22–32)
Calcium: 9 mg/dL (ref 8.9–10.3)
Chloride: 104 mmol/L (ref 98–111)
Creatinine, Ser: 3.82 mg/dL — ABNORMAL HIGH (ref 0.61–1.24)
GFR, Estimated: 17 mL/min — ABNORMAL LOW (ref >60)
Glucose, Bld: 206 mg/dL — ABNORMAL HIGH (ref 70–99)
Potassium: 4.3 mmol/L (ref 3.5–5.1)
Sodium: 138 mmol/L (ref 135–145)

## 2021-06-03 LAB — HEPARIN LEVEL (UNFRACTIONATED): Heparin Unfractionated: 0.26 IU/mL — ABNORMAL LOW (ref 0.30–0.70)

## 2021-06-03 SURGERY — LEFT HEART CATH AND CORONARY ANGIOGRAPHY
Anesthesia: Moderate Sedation

## 2021-06-03 MED ORDER — SODIUM CHLORIDE 0.9% FLUSH
3.0000 mL | Freq: Two times a day (BID) | INTRAVENOUS | Status: DC
  Administered 2021-06-03 – 2021-06-07 (×6): 3 mL via INTRAVENOUS

## 2021-06-03 MED ORDER — MIDAZOLAM HCL 2 MG/2ML IJ SOLN
INTRAMUSCULAR | Status: AC
  Filled 2021-06-03: qty 2

## 2021-06-03 MED ORDER — FENTANYL CITRATE (PF) 100 MCG/2ML IJ SOLN
INTRAMUSCULAR | Status: DC | PRN
  Administered 2021-06-03: 25 ug via INTRAVENOUS

## 2021-06-03 MED ORDER — HEPARIN SODIUM (PORCINE) 1000 UNIT/ML IJ SOLN
INTRAMUSCULAR | Status: AC
  Filled 2021-06-03: qty 10

## 2021-06-03 MED ORDER — ISOSORBIDE MONONITRATE ER 30 MG PO TB24
30.0000 mg | ORAL_TABLET | Freq: Every day | ORAL | Status: DC
  Administered 2021-06-03 – 2021-06-07 (×5): 30 mg via ORAL
  Filled 2021-06-03 (×5): qty 1

## 2021-06-03 MED ORDER — HEPARIN (PORCINE) IN NACL 1000-0.9 UT/500ML-% IV SOLN
INTRAVENOUS | Status: DC | PRN
  Administered 2021-06-03 (×2): 500 mL

## 2021-06-03 MED ORDER — SODIUM CHLORIDE 0.9 % IV SOLN: 250.0000 mL | INTRAVENOUS | Status: DC | PRN

## 2021-06-03 MED ORDER — LIDOCAINE HCL (PF) 1 % IJ SOLN
INTRAMUSCULAR | Status: DC | PRN
  Administered 2021-06-03: 2 mL

## 2021-06-03 MED ORDER — MIDAZOLAM HCL 2 MG/2ML IJ SOLN
INTRAMUSCULAR | Status: DC | PRN
  Administered 2021-06-03: 1 mg via INTRAVENOUS

## 2021-06-03 MED ORDER — LABETALOL HCL 5 MG/ML IV SOLN
20.0000 mg | Freq: Once | INTRAVENOUS | Status: AC
  Administered 2021-06-03: 20 mg via INTRAVENOUS
  Filled 2021-06-03: qty 4

## 2021-06-03 MED ORDER — ASPIRIN 81 MG PO CHEW
CHEWABLE_TABLET | ORAL | Status: AC
  Filled 2021-06-03: qty 1

## 2021-06-03 MED ORDER — EPOETIN ALFA 10000 UNIT/ML IJ SOLN: 4000.0000 [IU] | INTRAMUSCULAR | Status: DC

## 2021-06-03 MED ORDER — SODIUM CHLORIDE 0.9% FLUSH: 3.0000 mL | INTRAVENOUS | Status: DC | PRN

## 2021-06-03 MED ORDER — IOHEXOL 300 MG/ML  SOLN
INTRAMUSCULAR | Status: DC | PRN
  Administered 2021-06-03: 94 mL

## 2021-06-03 MED ORDER — FENTANYL CITRATE (PF) 100 MCG/2ML IJ SOLN
INTRAMUSCULAR | Status: AC
  Filled 2021-06-03: qty 2

## 2021-06-03 MED ORDER — SODIUM CHLORIDE 0.9% FLUSH: 3.0000 mL | Freq: Two times a day (BID) | INTRAVENOUS | Status: DC

## 2021-06-03 MED ORDER — HYDRALAZINE HCL 50 MG PO TABS
100.0000 mg | ORAL_TABLET | Freq: Three times a day (TID) | ORAL | Status: DC
  Administered 2021-06-03 – 2021-06-07 (×14): 100 mg via ORAL
  Filled 2021-06-03 (×14): qty 2

## 2021-06-03 MED ORDER — HEPARIN (PORCINE) IN NACL 1000-0.9 UT/500ML-% IV SOLN
INTRAVENOUS | Status: AC
  Filled 2021-06-03: qty 1000

## 2021-06-03 MED ORDER — LIDOCAINE 5 % EX PTCH
1.0000 | MEDICATED_PATCH | CUTANEOUS | Status: DC
  Administered 2021-06-03 – 2021-06-05 (×3): 1 via TRANSDERMAL
  Filled 2021-06-03 (×5): qty 1

## 2021-06-03 MED ORDER — VERAPAMIL HCL 2.5 MG/ML IV SOLN
INTRAVENOUS | Status: AC
  Filled 2021-06-03: qty 2

## 2021-06-03 MED ORDER — HEPARIN SODIUM (PORCINE) 1000 UNIT/ML IJ SOLN
INTRAMUSCULAR | Status: DC | PRN
  Administered 2021-06-03: 3000 [IU] via INTRAVENOUS

## 2021-06-03 MED ORDER — HYDRALAZINE HCL 20 MG/ML IJ SOLN
INTRAMUSCULAR | Status: AC
  Filled 2021-06-03: qty 1

## 2021-06-03 MED ORDER — HEPARIN BOLUS VIA INFUSION
1000.0000 [IU] | INTRAVENOUS | Status: AC
  Administered 2021-06-03: 1000 [IU] via INTRAVENOUS
  Filled 2021-06-03: qty 1000

## 2021-06-03 MED ORDER — VERAPAMIL HCL 2.5 MG/ML IV SOLN
INTRAVENOUS | Status: DC | PRN
  Administered 2021-06-03: 2.5 mg via INTRAVENOUS

## 2021-06-03 MED ORDER — LIDOCAINE HCL 1 % IJ SOLN
INTRAMUSCULAR | Status: AC
  Filled 2021-06-03: qty 20

## 2021-06-03 MED ORDER — SODIUM CHLORIDE 0.9 % IV SOLN: INTRAVENOUS | Status: DC

## 2021-06-03 SURGICAL SUPPLY — 10 items
CATH 5F 110X4 TIG (CATHETERS) ×1 IMPLANT
DEVICE RAD TR BAND REGULAR (VASCULAR PRODUCTS) ×1 IMPLANT
DRAPE BRACHIAL (DRAPES) ×1 IMPLANT
GLIDESHEATH SLEND SS 6F .021 (SHEATH) ×1 IMPLANT
GUIDEWIRE INQWIRE 1.5J.035X260 (WIRE) IMPLANT
INQWIRE 1.5J .035X260CM (WIRE) ×2
PACK CARDIAC CATH (CUSTOM PROCEDURE TRAY) ×2 IMPLANT
PROTECTION STATION PRESSURIZED (MISCELLANEOUS) ×2
SET ATX SIMPLICITY (MISCELLANEOUS) ×1 IMPLANT
STATION PROTECTION PRESSURIZED (MISCELLANEOUS) IMPLANT

## 2021-06-03 NOTE — Progress Notes (Signed)
°   06/02/21 1845 06/02/21 1900 06/02/21 1915  Vitals  BP (!) 165/81 (!) 157/79 (!) 174/85  MAP (mmHg) 104 100 110  Pulse Rate 79 82 81  ECG Heart Rate 81 83 82  Resp 17 18 16   During Hemodialysis Assessment  Blood Flow Rate (mL/min) 200 mL/min 200 mL/min 200 mL/min  Arterial Pressure (mmHg) -100 mmHg -100 mmHg -100 mmHg  Venous Pressure (mmHg) 170 mmHg 170 mmHg 170 mmHg  Transmembrane Pressure (mmHg) 40 mmHg 40 mmHg 40 mmHg  Ultrafiltration Rate (mL/min) 230 mL/min 230 mL/min 230 mL/min  Dialysate Flow Rate (mL/min) 300 ml/min 300 ml/min 300 ml/min  Conductivity: Machine  13.8 13.8 13.8  HD Safety Checks Performed Yes Yes Yes  Intra-Hemodialysis Comments Progressing as prescribed Progressing as prescribed Progressing as prescribed    06/02/21 1930  Vitals  BP  --   MAP (mmHg)  --   Pulse Rate  --   ECG Heart Rate  --   Resp  --   During Hemodialysis Assessment  Blood Flow Rate (mL/min)  --   Arterial Pressure (mmHg)  --   Venous Pressure (mmHg)  --   Transmembrane Pressure (mmHg)  --   Ultrafiltration Rate (mL/min)  --   Dialysate Flow Rate (mL/min)  --   Conductivity: Machine   --   HD Safety Checks Performed  --   Intra-Hemodialysis Comments Tx completed (Treatment end time 1934)

## 2021-06-03 NOTE — Progress Notes (Signed)
°   06/02/21 1715 06/02/21 1730 06/02/21 1745  Vitals  BP (!) 156/76 136/87 (!) 166/84  MAP (mmHg) 97 101 107  Pulse Rate 80 79 78  ECG Heart Rate 79 80 79  Resp 12 (!) 6 13  During Hemodialysis Assessment  Blood Flow Rate (mL/min) 200 mL/min 200 mL/min 200 mL/min  Arterial Pressure (mmHg) -40 mmHg -50 mmHg -70 mmHg  Venous Pressure (mmHg) 60 mmHg 60 mmHg 170 mmHg  Transmembrane Pressure (mmHg) 40 mmHg 40 mmHg 40 mmHg  Ultrafiltration Rate (mL/min) 230 mL/min 230 mL/min 230 mL/min  Dialysate Flow Rate (mL/min) 300 ml/min 300 ml/min 300 ml/min  Conductivity: Machine  13.8 13.8 13.8  HD Safety Checks Performed Yes Yes Yes  Dialysis Fluid Bolus Normal Saline  --   --   Bolus Amount (mL) 200 mL  --   --   Intra-Hemodialysis Comments Tx initiated (treatment start time 1718) Progressing as prescribed Progressing as prescribed    06/02/21 1800 06/02/21 1815 06/02/21 1830  Vitals  BP (!) 168/84 (!) 163/85 (!) 161/73  MAP (mmHg) 109 109 98  Pulse Rate 79 80 80  ECG Heart Rate 79 81 82  Resp 13 20 (!) 21  During Hemodialysis Assessment  Blood Flow Rate (mL/min) 200 mL/min 200 mL/min 200 mL/min  Arterial Pressure (mmHg) -70 mmHg -100 mmHg -100 mmHg  Venous Pressure (mmHg) 170 mmHg 170 mmHg 170 mmHg  Transmembrane Pressure (mmHg) 40 mmHg 40 mmHg 40 mmHg  Ultrafiltration Rate (mL/min) 230 mL/min 230 mL/min 230 mL/min  Dialysate Flow Rate (mL/min) 300 ml/min 300 ml/min 300 ml/min  Conductivity: Machine  13.8 13.8 13.8  HD Safety Checks Performed Yes Yes Yes  Dialysis Fluid Bolus  --   --   --   Bolus Amount (mL)  --   --   --   Intra-Hemodialysis Comments Progressing as prescribed Progressing as prescribed Progressing as prescribed

## 2021-06-03 NOTE — Progress Notes (Signed)
°   06/03/21 1045 06/03/21 1100 06/03/21 1103  Vitals  BP (!) 169/86 (!) 190/82 (!) 179/85  MAP (mmHg) 110 109 108  Pulse Rate 80 78 78  ECG Heart Rate 79 79 80  Resp 16 13 16   During Hemodialysis Assessment  Blood Flow Rate (mL/min) 250 mL/min 250 mL/min 250 mL/min  Arterial Pressure (mmHg) -70 mmHg -70 mmHg -70 mmHg  Venous Pressure (mmHg) 60 mmHg 60 mmHg 50 mmHg  Transmembrane Pressure (mmHg) 30 mmHg 40 mmHg 40 mmHg  Ultrafiltration Rate (mL/min) 510 mL/min 510 mL/min 510 mL/min  Dialysate Flow Rate (mL/min) 300 ml/min 300 ml/min 300 ml/min  Conductivity: Machine  13.9 13.9 13.9  HD Safety Checks Performed Yes Yes Yes  Intra-Hemodialysis Comments Progressing as prescribed Progressing as prescribed Progressing as prescribed

## 2021-06-03 NOTE — Progress Notes (Signed)
Informed NP that patient has been hypertensive all shift despite administering prn hydralazine IV x 2 and hydralazine po at HS. 1 time dose of labetalol given and scheduled po hydralazine increased from 50 mg to 100 mg starting @ 0600. Pt also medicated for pain from incision site for perm cath. Continue to monitor.  Bridgette Habermann DNP RN [phone] AM

## 2021-06-03 NOTE — Progress Notes (Signed)
PT Cancellation Note  Patient Details Name: Jimmy Olson MRN: [id number] DOB: 04-02-1961   Cancelled Treatment:    Reason Eval/Treat Not Completed: Patient at procedure or test/unavailable   Othniel Maret A Ingra Rother 06/03/2021, 12:43 PM

## 2021-06-03 NOTE — Progress Notes (Signed)
Utilizing the [employer] with Seth Bake [phone] post procedure, Dr. [name] discussed findings and plan for medical management and answered both patient and spouse's questions.

## 2021-06-03 NOTE — Progress Notes (Signed)
Progress Note  Patient Name: Jimmy Olson Date of Encounter: 06/03/2021  [facility] Cardiologist: Ida Rogue, MD   Patient Profile     60 y.o. male with history of presumably Nonischemic Cardiomyopathy (EF out of proportion to moderate CAD noted by catheterization January 2022) with CKD-5/ESRD, DM-2, HTN, HLD and anemia of chronic disease admitted with dyspnea, pulm edema consistent with acute decompensated CHF/volume overload. -> Has been seen by nephrology with plans for dialysis catheter being placed 06/03/2019 in order to initiate dialysis this hospitalization. -> Has had elevated troponin levels over 4000 (non-STEMI versus demand ischemia with CKD/ESRD).  Left heart catheterization was performed today which showed heavily calcified coronary arteries with moderate two-vessel coronary artery disease involving the LAD and right coronary artery.  No significant change overall from previous catheterization.    Subjective   The patient reports no chest pain or shortness of breath today.  Blood pressure continues to be very elevated.  Inpatient Medications    Scheduled Meds:  aspirin EC  81 mg Oral Daily   atorvastatin  80 mg Oral Daily   carvedilol  12.5 mg Oral BID   Chlorhexidine Gluconate Cloth  6 each Topical Q0600   docusate sodium  100 mg Oral BID   [START ON 06/04/2021] epoetin (EPOGEN/PROCRIT) injection  4,000 Units Intravenous Q T,Th,Sa-HD   furosemide  40 mg Intravenous BID   heparin sodium (porcine)       hydrALAZINE       hydrALAZINE  100 mg Oral Q8H   insulin aspart  0-15 Units Subcutaneous TID WC   isosorbide mononitrate  30 mg Oral Daily   lidocaine  1 patch Transdermal Q24H   sodium chloride flush  3 mL Intravenous Q12H   sodium chloride flush  3 mL Intravenous Q12H   Continuous Infusions:  sodium chloride     PRN Meds: sodium chloride, acetaminophen **OR** acetaminophen, albuterol, hydrALAZINE, HYDROmorphone (DILAUDID) injection, LORazepam,  morphine injection, ondansetron **OR** ondansetron (ZOFRAN) IV, ondansetron (ZOFRAN) IV, oxyCODONE, polyethylene glycol, sodium chloride flush   Vital Signs    Vitals:   06/03/21 1430 06/03/21 1445 06/03/21 1500 06/03/21 1515  BP: (!) 191/87 (!) 191/88 (!) 192/94   Pulse: 81 80 80 81  Resp: 12 12 14 13   Temp:      TempSrc:      SpO2: 99% 99% 99% 99%  Weight:      Height:        Intake/Output Summary (Last 24 hours) at 06/03/2021 1519 Last data filed at 06/03/2021 1210 Gross per 24 hour  Intake 352.16 ml  Output 1500 ml  Net -1147.84 ml    Last 3 Weights 06/03/2021 06/03/2021 06/03/2021  Weight (lbs) 137 lb 2 oz 141 lb 5 oz 142 lb 11.2 oz  Weight (kg) 62.2 kg 64.1 kg 64.728 kg      Telemetry    Sinus rhythm with heart rate in the 60s 80s.- Personally Reviewed  ECG    No new EKG- Personally Reviewed  Physical Exam   GEN: No acute distress.  Resting comfortably in bed Neck: No JVD or HJR. Cardiac: RRR, normal S1-S2.  No M/R/G.   Respiratory: Clear to auscultation bilaterally. GI  Nonlabored, good air movement.: Soft, nontender, non-distended  MS: No edema; No deformity. Neuro:  Nonfocal  Psych: Normal affect   Labs    High Sensitivity Troponin:   Recent Labs  Lab 05/31/21 2348 06/01/21 0204 06/01/21 0445 06/01/21 0746 06/01/21 1002  TROPONINIHS 370* 1,372* 2,862* 0,034*  4,844*      Chemistry Recent Labs  Lab 05/31/21 2210 06/01/21 0026 06/01/21 0030 06/02/21 0705 06/03/21 0648  NA 136  --   --  137 138  K 5.3*  --   --  5.1 4.3  CL 106  --   --  106 104  CO2 21*  --   --  23 27  GLUCOSE 295*  --   --  194* 206*  BUN 69*  --   --  76* 50*  CREATININE 4.61*  --  4.46* 4.69* 3.82*  CALCIUM 9.0  --   --  9.1 9.0  MG  --  1.9  --   --  1.9  GFRNONAA 14*  --  15* 14* 17*  ANIONGAP 9  --   --  8 7     Lipids No results for input(s): CHOL, TRIG, HDL, LABVLDL, LDLCALC, CHOLHDL in the last 168 hours.  Hematology Recent Labs  Lab 06/01/21 0445  06/02/21 0705 06/03/21 0648  WBC 7.0 5.1 4.8  RBC 2.53* 2.62* 2.67*  HGB 7.5* 7.8* 8.0*  HCT 22.6* 22.9* 23.5*  MCV 89.3 87.4 88.0  MCH 29.6 29.8 30.0  MCHC 33.2 34.1 34.0  RDW 12.1 12.1 12.0  PLT 181 196 180    Thyroid No results for input(s): TSH, FREET4 in the last 168 hours.  BNP Recent Labs  Lab 05/31/21 2210  BNP 1,820.5*     DDimer No results for input(s): DDIMER in the last 168 hours.   Radiology    CARDIAC CATHETERIZATION  Result Date: 06/03/2021   Prox LAD lesion is 40% stenosed.   Mid LAD lesion is 60% stenosed.   Prox RCA lesion is 20% stenosed.   Mid RCA lesion is 30% stenosed.   1st Diag lesion is 70% stenosed.   Dist RCA lesion is 30% stenosed.   RPDA lesion is 40% stenosed.   There is mild left ventricular systolic dysfunction.   LV end diastolic pressure is mildly elevated.   The left ventricular ejection fraction is 45-50% by visual estimate. 1.  Heavily calcified coronary arteries with moderate two-vessel coronary artery disease involving the LAD and right coronary artery.  No culprit is identified for non-STEMI. 2.  Mildly reduced LV systolic function and mildly elevated left ventricular end-diastolic pressure. Recommendations: The patient disease appears to be chronic with no acute culprit identified.  Suspect that elevated troponin is due to supply demand ischemia.  FFR guided PCI of the LAD can be considered in the future if the patient has refractory anginal symptoms.  For now, recommend aggressive medical therapy.   PERIPHERAL VASCULAR CATHETERIZATION  Result Date: 06/02/2021 See surgical note for result.  [place] UE VEIN MAPPING LEFT (PRE-OP AVF)  Result Date: 06/02/2021 CLINICAL DATA:  Preop evaluation for AV fistula creation EXAM: [place] EXTREM UP VEIN MAPPING COMPARISON:  None. FINDINGS: LEFT ARTERIES Wrist Radial Artery: Size 2.80mm Waveform triphasic Wrist Ulnar Artery: Size 2.58mm Waveform triphasic Prox. Forearm Radial Artery: Size 3.34mm Waveform triphasic  Upper Arm Brachial Artery: Size 4.62mm Waveform triphasic LEFT VEINS Forearm Cephalic Vein: Prox 7.3ZH Distal 2.40mm Depth 2.9JM Upper Arm Cephalic Vein: Prox 4.2AS Distal 4.75mm Depth 3.4-1.9QQ Upper Arm Basilic Vein: Prox 2.2LN Distal 3.32mm Depth 3.9-8.72mm Upper Arm Brachial Vein: Prox 4.25mm Distal 3.88mm Depth 8.5-14.82mm ADDITIONAL LEFT VEINS Axillary Vein:  6.28mm (occluded, noncompressible) Subclavian Vein: Patient: Yes Respiratory Phasicity: Present Internal Jugular Vein: Patent: Yes    Respiratory Phasicity: Present IMPRESSION: 1. DVT noted within the left  axillary vein. 2. Left upper extremity vasculature measurements as above. These results will be called to the ordering clinician or representative by the Radiologist Assistant, and communication documented in the PACS or [employer]. Electronically Signed   By: Miachel Roux M.D.   On: 06/02/2021 09:27   ECHOCARDIOGRAM COMPLETE  Result Date: 06/02/2021    ECHOCARDIOGRAM REPORT   Patient Name:   KYMARI NUON Date of Exam: 06/02/2021 Medical Rec #:  [id number]     Height:       62.0 in Accession #:    [id number]    Weight:       145.2 lb Date of Birth:  Jun 09, 1961     BSA:          1.668 m Patient Age:    49 years      BP:           155/77 mmHg Patient Gender: M             HR:           82 bpm. Exam Location:  [facility] Procedure: 2D Echo, Cardiac Doppler and Color Doppler Indications:     Cardiac arrest I 46.9  History:         Patient has no prior history of Echocardiogram examinations.                  CHF; Risk Factors:Hypertension and Diabetes.  Sonographer:     Sherrie Sport Referring Phys:  [place] Diagnosing Phys: Ida Rogue MD IMPRESSIONS  1. Left ventricular ejection fraction, by estimation, is 50 to 55%. The left ventricle has low normal function. The left ventricle has no regional wall motion abnormalities. There is mild left ventricular hypertrophy. Left ventricular diastolic parameters are consistent with Grade I diastolic  dysfunction (impaired relaxation).  2. Right ventricular systolic function is normal. The right ventricular size is normal. Tricuspid regurgitation signal is inadequate for assessing PA pressure.  3. The mitral valve is normal in structure. Mild mitral valve regurgitation. No evidence of mitral stenosis.  4. The aortic valve was not well visualized. Aortic valve regurgitation is not visualized. No aortic stenosis is present. FINDINGS  Left Ventricle: Left ventricular ejection fraction, by estimation, is 50 to 55%. The left ventricle has low normal function. The left ventricle has no regional wall motion abnormalities. The left ventricular internal cavity size was normal in size. There is mild left ventricular hypertrophy. Left ventricular diastolic parameters are consistent with Grade I diastolic dysfunction (impaired relaxation). Right Ventricle: The right ventricular size is normal. No increase in right ventricular wall thickness. Right ventricular systolic function is normal. Tricuspid regurgitation signal is inadequate for assessing PA pressure. Left Atrium: Left atrial size was normal in size. Right Atrium: Right atrial size was normal in size. Pericardium: There is no evidence of pericardial effusion. Mitral Valve: The mitral valve is normal in structure. Mild mitral valve regurgitation. No evidence of mitral valve stenosis. MV peak gradient, 6.0 mmHg. The mean mitral valve gradient is 2.0 mmHg. Tricuspid Valve: The tricuspid valve is normal in structure. Tricuspid valve regurgitation is not demonstrated. No evidence of tricuspid stenosis. Aortic Valve: The aortic valve was not well visualized. Aortic valve regurgitation is not visualized. No aortic stenosis is present. Aortic valve mean gradient measures 5.0 mmHg. Aortic valve peak gradient measures 7.7 mmHg. Aortic valve area, by VTI measures 1.90 cm. Pulmonic Valve: The pulmonic valve was normal in structure. Pulmonic valve regurgitation is not visualized.  No evidence of pulmonic stenosis. Aorta: The aortic root is normal in size and structure. Venous: The pulmonary veins were not well visualized. The inferior vena cava was not well visualized. IAS/Shunts: No atrial level shunt detected by color flow Doppler.  LEFT VENTRICLE PLAX 2D LVIDd:         5.30 cm   Diastology LVIDs:         3.80 cm   LV e' medial:    3.15 cm/s LV PW:         1.70 cm   LV E/e' medial:  25.0 LV IVS:        1.05 cm   LV e' lateral:   4.46 cm/s LVOT diam:     2.00 cm   LV E/e' lateral: 17.7 LV SV:         54 LV SV Index:   32 LVOT Area:     3.14 cm  RIGHT VENTRICLE RV Basal diam:  4.30 cm RV S prime:     14.30 cm/s TAPSE (M-mode): 4.5 cm LEFT ATRIUM             Index        RIGHT ATRIUM           Index LA diam:        3.60 cm 2.16 cm/m   RA Area:     13.50 cm LA Vol (A2C):   66.7 ml 39.98 ml/m  RA Volume:   32.50 ml  19.48 ml/m LA Vol (A4C):   66.8 ml 40.04 ml/m LA Biplane Vol: 67.3 ml 40.34 ml/m  AORTIC VALVE                     PULMONIC VALVE AV Area (Vmax):    1.98 cm      PV Vmax:        0.96 m/s AV Area (Vmean):   1.92 cm      PV Vmean:       59.600 cm/s AV Area (VTI):     1.90 cm      PV VTI:         0.157 m AV Vmax:           139.00 cm/s   PV Peak grad:   3.7 mmHg AV Vmean:          103.000 cm/s  PV Mean grad:   2.0 mmHg AV VTI:            0.284 m       RVOT Peak grad: 4 mmHg AV Peak Grad:      7.7 mmHg AV Mean Grad:      5.0 mmHg LVOT Vmax:         87.50 cm/s LVOT Vmean:        62.900 cm/s LVOT VTI:          0.172 m LVOT/AV VTI ratio: 0.61  AORTA Ao Root diam: 3.20 cm MITRAL VALVE                TRICUSPID VALVE MV Area (PHT): 3.46 cm     TR Peak grad:   14.3 mmHg MV Area VTI:   2.57 cm     TR Vmax:        189.00 cm/s MV Peak grad:  6.0 mmHg MV Mean grad:  2.0 mmHg     SHUNTS MV Vmax:       1.22 m/s     Systemic VTI:  0.17  m MV Vmean:      64.9 cm/s    Systemic Diam: 2.00 cm MV Decel Time: 219 msec     Pulmonic VTI:  0.168 m MV E velocity: 78.80 cm/s MV A velocity: 109.00 cm/s  MV E/A ratio:  0.72 Ida Rogue MD Electronically signed by Ida Rogue MD Signature Date/Time: 06/02/2021/10:15:30 AM    Final     Cardiac Studies   Cardiac Cath January 2022: pRCA 20%, mRCA 30%, dRCA 50% involving 50% rPAV. pLAD ~40% &D1 70%, mLAD 60% (not flow limiting). RHC - mildly elevated filling pressures - mild pulm HTN with normal CO/CI.   Echo completed - not yet read -> EF appears to be notably improved compared with 30 to 35% seen in January 2022.  No obvious wall motion normalities.  Does berry to be somewhat dilated, but EF at least 45%, visual estimation.  Still appears to have GRII DD.   Assessment & Plan    1.  Acute on chronic systolic heart failure: Mildly reduced LV systolic function.  EF appears to be improved compared to before.  Suspect that his cardiomyopathy is due to hypertensive heart disease. Fluid management per dialysis and nephrology. Optimize heart failure treatment. Continue carvedilol as well as a combination of Imdur and hydralazine. If blood pressure remains elevated, we could consider adding Entresto now that he will be on permanent dialysis.  2.  Elevated troponin likely due to supply demand ischemia.  Cardiac catheterization was done today showed no acute culprit lesions.  The patient does have heavily calcified coronary arteries with moderate two-vessel coronary artery disease that requires aggressive medical therapy.  LAD disease appears borderline and thus if the patient has refractory angina in the future, FFR guided PCI and atherectomy can be considered. I discontinued heparin drip. Continue low-dose aspirin and high-dose atorvastatin.  3.  End-stage renal disease: He is now on hemodialysis.    For questions or updates, please contact [facility] Please consult [email or web address] for contact info under        Signed, [name], MD  06/03/2021, 3:19 PM

## 2021-06-03 NOTE — Progress Notes (Signed)
[facility]  ROUNDING NOTE   Subjective:   Jimmy Olson is a 60 year old male with a past medical history consisting of coronary artery disease, diabetes, hypertension, chronic kidney disease stage IV.  He presents to the emergency department with complaints of shortness of breath.  He will be admitted for SOB (shortness of breath) [R06.02] Kidney damage [S37.009A] Acute decompensated heart failure ([place]) [I50.9] Hypertension, unspecified type [I10] Congestive heart failure, unspecified HF chronicity, unspecified heart failure type ([place]) [I50.9]  Patient seen and evaluated during dialysis   HEMODIALYSIS FLOWSHEET:  Blood Flow Rate (mL/min): 250 mL/min Arterial Pressure (mmHg): -70 mmHg Venous Pressure (mmHg): 60 mmHg Transmembrane Pressure (mmHg): 40 mmHg Ultrafiltration Rate (mL/min): 510 mL/min Dialysate Flow Rate (mL/min): 300 ml/min Conductivity: Machine : 13.9 Conductivity: Machine : 13.9 Dialysis Fluid Bolus: Normal Saline Bolus Amount (mL): 250 mL  No complaints at this time   Objective:  Vital signs in last 24 hours:  Temp:  [97.7 F (36.5 C)-98.3 F (36.8 C)] 98.2 F (36.8 C) (12/16 1215) Pulse Rate:  [78-121] 81 (12/16 1230) Resp:  [6-21] 18 (12/16 1230) BP: (97-197)/(67-102) 197/85 (12/16 1230) SpO2:  [94 %-100 %] 100 % (12/16 1230) Weight:  [62.2 kg-66.6 kg] 62.2 kg (12/16 1210)  Weight change: 0.738 kg Filed Weights   06/03/21 0418 06/03/21 0923 06/03/21 1210  Weight: 64.7 kg 64.1 kg 62.2 kg    Intake/Output: I/O last 3 completed shifts: In: 1306.2 [I.V.:1142.9; IV Piggyback:163.3] Out: 3200 [Urine:3200]   Intake/Output this shift:  Total I/O In: -  Out: 500 [Other:500]  Physical Exam: General: NAD, resting in bed  Head: Normocephalic, atraumatic. Moist oral mucosal membranes  Eyes: Anicteric  Lungs:  Diminished in bases, normal effort, 2 L [place]  Heart: Regular rate and rhythm  Abdomen:  Soft, nontender  Extremities:  no  peripheral edema.  Neurologic: Nonfocal, moving all four extremities  Skin: No lesions  Access: Rt IJ Permcath    Basic Metabolic Panel: Recent Labs  Lab 05/31/21 2210 06/01/21 0026 06/01/21 0030 06/02/21 0705 06/03/21 0648  NA 136  --   --  137 138  K 5.3*  --   --  5.1 4.3  CL 106  --   --  106 104  CO2 21*  --   --  23 27  GLUCOSE 295*  --   --  194* 206*  BUN 69*  --   --  76* 50*  CREATININE 4.61*  --  4.46* 4.69* 3.82*  CALCIUM 9.0  --   --  9.1 9.0  MG  --  1.9  --   --  1.9     Liver Function Tests: No results for input(s): AST, ALT, ALKPHOS, BILITOT, PROT, ALBUMIN in the last 168 hours. No results for input(s): LIPASE, AMYLASE in the last 168 hours. No results for input(s): AMMONIA in the last 168 hours.  CBC: Recent Labs  Lab 05/31/21 2210 06/01/21 0204 06/01/21 0445 06/02/21 0705 06/03/21 0648  WBC 8.9 8.2 7.0 5.1 4.8  NEUTROABS 4.9  --   --   --  2.8  HGB 9.5* 8.0* 7.5* 7.8* 8.0*  HCT 28.6* 24.1* 22.6* 22.9* 23.5*  MCV 91.4 90.3 89.3 87.4 88.0  PLT 231 183 181 196 180     Cardiac Enzymes: No results for input(s): CKTOTAL, CKMB, CKMBINDEX, TROPONINI in the last 168 hours.  BNP: Invalid input(s): POCBNP  CBG: Recent Labs  Lab 06/02/21 0537 06/02/21 0943 06/02/21 1138 06/02/21 2036 06/03/21 0740  GLUCAP  153* 187* 185* 248* 199*     Microbiology: Results for orders placed or performed during the hospital encounter of 05/31/21  Resp Panel by RT-PCR (Flu A&B, Covid) Nasopharyngeal Swab     Status: None   Collection Time: 05/31/21 10:10 PM   Specimen: Nasopharyngeal Swab; Nasopharyngeal(NP) swabs in vial transport medium  Result Value Ref Range Status   SARS Coronavirus 2 by RT PCR NEGATIVE NEGATIVE Final    Comment: (NOTE) SARS-CoV-2 target nucleic acids are NOT DETECTED.  The SARS-CoV-2 RNA is generally detectable in upper respiratory specimens during the acute phase of infection. The lowest concentration of SARS-CoV-2 viral copies  this assay can detect is 138 copies/mL. A negative result does not preclude SARS-Cov-2 infection and should not be used as the sole basis for treatment or other patient management decisions. A negative result may occur with  improper specimen collection/handling, submission of specimen other than nasopharyngeal swab, presence of viral mutation(s) within the areas targeted by this assay, and inadequate number of viral copies(<138 copies/mL). A negative result must be combined with clinical observations, patient history, and epidemiological information. The expected result is Negative.  Fact Sheet for Patients:  EntrepreneurPulse.com.au  Fact Sheet for Healthcare Providers:  IncredibleEmployment.be  This test is no t yet approved or cleared by the [place] FDA and  has been authorized for detection and/or diagnosis of SARS-CoV-2 by FDA under an Emergency Use Authorization (EUA). This EUA will remain  in effect (meaning this test can be used) for the duration of the COVID-19 declaration under Section 564(b)(1) of the Act, 21 U.S.C.section 360bbb-3(b)(1), unless the authorization is terminated  or revoked sooner.       Influenza A by PCR NEGATIVE NEGATIVE Final   Influenza B by PCR NEGATIVE NEGATIVE Final    Comment: (NOTE) The Xpert Xpress SARS-CoV-2/FLU/RSV plus assay is intended as an aid in the diagnosis of influenza from Nasopharyngeal swab specimens and should not be used as a sole basis for treatment. Nasal washings and aspirates are unacceptable for Xpert Xpress SARS-CoV-2/FLU/RSV testing.  Fact Sheet for Patients: EntrepreneurPulse.com.au  Fact Sheet for Healthcare Providers: IncredibleEmployment.be  This test is not yet approved or cleared by the [place] FDA and has been authorized for detection and/or diagnosis of SARS-CoV-2 by FDA under an Emergency Use Authorization (EUA). This EUA  will remain in effect (meaning this test can be used) for the duration of the COVID-19 declaration under Section 564(b)(1) of the Act, 21 U.S.C. section 360bbb-3(b)(1), unless the authorization is terminated or revoked.  Performed at [facility], [address] [place],  [address]     Coagulation Studies: Recent Labs    06/01/21 0204  LABPROT 13.7  INR 1.1     Urinalysis: No results for input(s): COLORURINE, LABSPEC, PHURINE, GLUCOSEU, HGBUR, BILIRUBINUR, KETONESUR, PROTEINUR, UROBILINOGEN, NITRITE, LEUKOCYTESUR in the last 72 hours.  Invalid input(s): APPERANCEUR    Imaging: PERIPHERAL VASCULAR CATHETERIZATION  Result Date: 06/02/2021 See surgical note for result.  [place] UE VEIN MAPPING LEFT (PRE-OP AVF)  Result Date: 06/02/2021 CLINICAL DATA:  Preop evaluation for AV fistula creation EXAM: [place] EXTREM UP VEIN MAPPING COMPARISON:  None. FINDINGS: LEFT ARTERIES Wrist Radial Artery: Size 2.70mm Waveform triphasic Wrist Ulnar Artery: Size 2.63mm Waveform triphasic Prox. Forearm Radial Artery: Size 3.31mm Waveform triphasic Upper Arm Brachial Artery: Size 4.45mm Waveform triphasic LEFT VEINS Forearm Cephalic Vein: Prox 5.6DJ Distal 2.50mm Depth 4.9FW Upper Arm Cephalic Vein: Prox 2.6VZ Distal 4.67mm Depth 8.5-8.8FO Upper Arm Basilic Vein: Prox 2.7XA  Distal 3.84mm Depth 3.9-8.20mm Upper Arm Brachial Vein: Prox 4.38mm Distal 3.68mm Depth 8.5-14.19mm ADDITIONAL LEFT VEINS Axillary Vein:  6.33mm (occluded, noncompressible) Subclavian Vein: Patient: Yes Respiratory Phasicity: Present Internal Jugular Vein: Patent: Yes    Respiratory Phasicity: Present IMPRESSION: 1. DVT noted within the left axillary vein. 2. Left upper extremity vasculature measurements as above. These results will be called to the ordering clinician or representative by the Radiologist Assistant, and communication documented in the PACS or [employer]. Electronically Signed   By: Miachel Roux M.D.   On: 06/02/2021  09:27   ECHOCARDIOGRAM COMPLETE  Result Date: 06/02/2021    ECHOCARDIOGRAM REPORT   Patient Name:   MERVILLE HIJAZI Date of Exam: 06/02/2021 Medical Rec #:  [id number]     Height:       62.0 in Accession #:    [id number]    Weight:       145.2 lb Date of Birth:  1961-04-10     BSA:          1.668 m Patient Age:    26 years      BP:           155/77 mmHg Patient Gender: M             HR:           82 bpm. Exam Location:  [facility] Procedure: 2D Echo, Cardiac Doppler and Color Doppler Indications:     Cardiac arrest I 46.9  History:         Patient has no prior history of Echocardiogram examinations.                  CHF; Risk Factors:Hypertension and Diabetes.  Sonographer:     Sherrie Sport Referring Phys:  [place] Diagnosing Phys: Ida Rogue MD IMPRESSIONS  1. Left ventricular ejection fraction, by estimation, is 50 to 55%. The left ventricle has low normal function. The left ventricle has no regional wall motion abnormalities. There is mild left ventricular hypertrophy. Left ventricular diastolic parameters are consistent with Grade I diastolic dysfunction (impaired relaxation).  2. Right ventricular systolic function is normal. The right ventricular size is normal. Tricuspid regurgitation signal is inadequate for assessing PA pressure.  3. The mitral valve is normal in structure. Mild mitral valve regurgitation. No evidence of mitral stenosis.  4. The aortic valve was not well visualized. Aortic valve regurgitation is not visualized. No aortic stenosis is present. FINDINGS  Left Ventricle: Left ventricular ejection fraction, by estimation, is 50 to 55%. The left ventricle has low normal function. The left ventricle has no regional wall motion abnormalities. The left ventricular internal cavity size was normal in size. There is mild left ventricular hypertrophy. Left ventricular diastolic parameters are consistent with Grade I diastolic dysfunction (impaired relaxation). Right Ventricle: The right  ventricular size is normal. No increase in right ventricular wall thickness. Right ventricular systolic function is normal. Tricuspid regurgitation signal is inadequate for assessing PA pressure. Left Atrium: Left atrial size was normal in size. Right Atrium: Right atrial size was normal in size. Pericardium: There is no evidence of pericardial effusion. Mitral Valve: The mitral valve is normal in structure. Mild mitral valve regurgitation. No evidence of mitral valve stenosis. MV peak gradient, 6.0 mmHg. The mean mitral valve gradient is 2.0 mmHg. Tricuspid Valve: The tricuspid valve is normal in structure. Tricuspid valve regurgitation is not demonstrated. No evidence of tricuspid stenosis. Aortic Valve: The aortic valve was not well visualized.  Aortic valve regurgitation is not visualized. No aortic stenosis is present. Aortic valve mean gradient measures 5.0 mmHg. Aortic valve peak gradient measures 7.7 mmHg. Aortic valve area, by VTI measures 1.90 cm. Pulmonic Valve: The pulmonic valve was normal in structure. Pulmonic valve regurgitation is not visualized. No evidence of pulmonic stenosis. Aorta: The aortic root is normal in size and structure. Venous: The pulmonary veins were not well visualized. The inferior vena cava was not well visualized. IAS/Shunts: No atrial level shunt detected by color flow Doppler.  LEFT VENTRICLE PLAX 2D LVIDd:         5.30 cm   Diastology LVIDs:         3.80 cm   LV e' medial:    3.15 cm/s LV PW:         1.70 cm   LV E/e' medial:  25.0 LV IVS:        1.05 cm   LV e' lateral:   4.46 cm/s LVOT diam:     2.00 cm   LV E/e' lateral: 17.7 LV SV:         54 LV SV Index:   32 LVOT Area:     3.14 cm  RIGHT VENTRICLE RV Basal diam:  4.30 cm RV S prime:     14.30 cm/s TAPSE (M-mode): 4.5 cm LEFT ATRIUM             Index        RIGHT ATRIUM           Index LA diam:        3.60 cm 2.16 cm/m   RA Area:     13.50 cm LA Vol (A2C):   66.7 ml 39.98 ml/m  RA Volume:   32.50 ml  19.48 ml/m LA  Vol (A4C):   66.8 ml 40.04 ml/m LA Biplane Vol: 67.3 ml 40.34 ml/m  AORTIC VALVE                     PULMONIC VALVE AV Area (Vmax):    1.98 cm      PV Vmax:        0.96 m/s AV Area (Vmean):   1.92 cm      PV Vmean:       59.600 cm/s AV Area (VTI):     1.90 cm      PV VTI:         0.157 m AV Vmax:           139.00 cm/s   PV Peak grad:   3.7 mmHg AV Vmean:          103.000 cm/s  PV Mean grad:   2.0 mmHg AV VTI:            0.284 m       RVOT Peak grad: 4 mmHg AV Peak Grad:      7.7 mmHg AV Mean Grad:      5.0 mmHg LVOT Vmax:         87.50 cm/s LVOT Vmean:        62.900 cm/s LVOT VTI:          0.172 m LVOT/AV VTI ratio: 0.61  AORTA Ao Root diam: 3.20 cm MITRAL VALVE                TRICUSPID VALVE MV Area (PHT): 3.46 cm     TR Peak grad:   14.3 mmHg MV Area VTI:   2.57 cm  TR Vmax:        189.00 cm/s MV Peak grad:  6.0 mmHg MV Mean grad:  2.0 mmHg     SHUNTS MV Vmax:       1.22 m/s     Systemic VTI:  0.17 m MV Vmean:      64.9 cm/s    Systemic Diam: 2.00 cm MV Decel Time: 219 msec     Pulmonic VTI:  0.168 m MV E velocity: 78.80 cm/s MV A velocity: 109.00 cm/s MV E/A ratio:  0.72 Ida Rogue MD Electronically signed by Ida Rogue MD Signature Date/Time: 06/02/2021/10:15:30 AM    Final      Medications:    heparin 1,100 Units/hr (06/03/21 0857)    aspirin EC  81 mg Oral Daily   atorvastatin  80 mg Oral Daily   carvedilol  12.5 mg Oral BID   Chlorhexidine Gluconate Cloth  6 each Topical Q0600   docusate sodium  100 mg Oral BID   furosemide  40 mg Intravenous BID   heparin sodium (porcine)       hydrALAZINE  100 mg Oral Q8H   insulin aspart  0-15 Units Subcutaneous TID WC   isosorbide mononitrate  30 mg Oral Daily   lidocaine  1 patch Transdermal Q24H   sodium chloride flush  3 mL Intravenous Q12H   acetaminophen **OR** acetaminophen, albuterol, hydrALAZINE, HYDROmorphone (DILAUDID) injection, LORazepam, morphine injection, ondansetron **OR** ondansetron (ZOFRAN) IV, ondansetron (ZOFRAN)  IV, oxyCODONE, polyethylene glycol  Assessment/ Plan:  Jimmy Olson is a 60 y.o.  male ith a past medical history consisting of coronary artery disease, diabetes, hypertension, chronic kidney disease stage IV.  He presents to the emergency department with complaints of shortness of breath.  He will be admitted for SOB (shortness of breath) [R06.02] Kidney damage [S37.009A] Acute decompensated heart failure (HCC) [I50.9] Hypertension, unspecified type [I10] Congestive heart failure, unspecified HF chronicity, unspecified heart failure type ([place]) [I50.9]   End-stage renal disease requiring hemodialysis.  Based on chart review and recent office note.  We feel patient kidney disease has progressed to stage V requiring initiation of renal replacement therapy.   - Appreciate vascular placing Permcath 06/02/21.   - Received first dialysis treatment on 06/02/21, tolerated well  - Receiving second treatment today with UF goal 521ml.   -Next treatment scheduled for tomorrow, in chair  -Dialysis coordinator aware of patient and outpatient clinic search in progress.   2. Anemia of chronic kidney disease Lab Results  Component Value Date   HGB 8.0 (L) 06/03/2021    Hemoglobin remains below target. Will order low dose EPO with dialysis.  3. Secondary Hyperparathyroidism:  Lab Results  Component Value Date   CALCIUM 9.0 06/03/2021   PHOS 4.3 07/18/2020  Calcium and phosphorus at acceptable range  4. Diabetes mellitus type II with chronic kidney disease  insulin dependent. Home regimen includes NovoLog and Levemir. Most recent hemoglobin A1c is 10.3 on 12/24/20.  Glucose elevated  5.  Hypertension with chronic kidney disease.  Home regimen includes amlodipine, carvedilol, isosorbide, hydralazine and torsemide.  Currently receiving carvedilol, furosemide, and hydralazine.  Current blood pressure 189/91 during dialysis  6.  Chronic systolic heart failure.  EF from July 16, 2020 shows EF 30  to 35% with mild LVH and a grade 2 diastolic dysfunction.   LOS: 2   12/16/202212:49 PM

## 2021-06-03 NOTE — Interval H&P Note (Signed)
History and Physical Interval Note:  06/03/2021 1:37 PM  Jimmy Olson  has presented today for surgery, with the diagnosis of non ST segment myocardial infarction.  The various methods of treatment have been discussed with the patient and family. After consideration of risks, benefits and other options for treatment, the patient has consented to  Procedure(s): LEFT HEART CATH AND CORONARY ANGIOGRAPHY (N/A) as a surgical intervention.  The patient's history has been reviewed, patient examined, no change in status, stable for surgery.  I have reviewed the patient's chart and labs.  Questions were answered to the patient's satisfaction.     [name]

## 2021-06-03 NOTE — Progress Notes (Signed)
ANTICOAGULATION CONSULT NOTE  Pharmacy Consult for heparin infusion Indication: NSTEMI  Patient Measurements: Height: 5\' 2"  (157.5 cm) Weight: 64.7 kg (142 lb 11.2 oz) IBW/kg (Calculated) : 54.6 Heparin Dosing Weight: 70.3 kg  Labs: Recent Labs    06/01/21 0030 06/01/21 0204 06/01/21 0445 06/01/21 0746 06/01/21 1002 06/01/21 1118 06/01/21 2108 06/02/21 0705 06/03/21 0648  HGB  --  8.0* 7.5*  --   --   --   --  7.8* 8.0*  HCT  --  24.1* 22.6*  --   --   --   --  22.9* 23.5*  PLT  --  183 181  --   --   --   --  196 180  APTT  --  25  --   --   --   --   --   --   --   LABPROT  --  13.7  --   --   --   --   --   --   --   INR  --  1.1  --   --   --   --   --   --   --   HEPARINUNFRC  --   --   --   --   --    < > 0.37 0.35 0.26*  CREATININE 4.46*  --   --   --   --   --   --  4.69* 3.82*  TROPONINIHS  --  1,372* 2,862* 3,611* 4,844*  --   --   --   --    < > = values in this interval not displayed.     Estimated Creatinine Clearance: 16.3 mL/min (A) (by C-G formula based on SCr of 3.82 mg/dL (H)).   Medical History: Past Medical History:  Diagnosis Date   Chronic HFrEF (heart failure with reduced ejection fraction) ([place])    a. 06/2020 Echo: EF 30-35%, glob HK, GrII DD, nl RV fxn. Mild LAE.   Chronic kidney disease (CKD), stage IV (severe) ([place])    COVID-19 virus infection 02/2020   Diabetes mellitus without complication (HCC)    Hypertension    NICM (nonischemic cardiomyopathy) ([place])    a. 07/2009 MV: EF 48%; b. 08/2019 Echo: EF 45-50%. Global HK. Mod LVH. GrIDD; c. 06/2020 Echo: EF 30-35%, glob HK, mild LVH, GrII DD, nl RV fxn, mild LAE; d. 06/2020 Cath: nonobs dzs.   Nonobstructive CAD (coronary artery disease)    a. 07/2009 MV: EF 48%. No ischemia; b. 06/2020 Cath: LM min irregs, LAD 40p, 27m, D1 70, D2 min irregs, D3 nl, RI min irregs, LCX small, mild diff dzs, RCA 20p, 17m, 50d, RPAV 50-->Med Rx.    Assessment: Pt is 60 yo male with h/o CKD, DM Type 2, Heart Disease  presenting to ED c/o SOB, found with slightly elevated Troponin I, trending up. Pharmacy has been consulted for heparin dosing/monitoring.  Goal of Therapy:  Heparin level 0.3-0.7 units/ml Monitor platelets by anticoagulation protocol: Yes  Date/time  Level  Comment 12/14@2108   HL0.37  Therapeutic x1 @ 1050 units/hr 12/15@0705   HL 0.35 Therapeutic x2 @ 1050 units/hr 12/16@0648   HL 0.29 @1050  units/hr   Plan:  Heparin level slightly subtherapeutic this morning RN confirmed heparin not HELD in the last 8hrs Bolus Heparin 1000 units now, then increase rate to 1100 unuts/hr Recheck HL 8hrs after rate change CBC daily while on heparin.  Bryden Darden Rodriguez-Dube PharmD, BCPS 06/03/2021 8:08 AM

## 2021-06-03 NOTE — Progress Notes (Signed)
PROGRESS NOTE    Jimmy Olson  KVQ:[id number] DOB: [birth year] DOA: 05/31/2021 PCP: [facility]    Brief Narrative:   60 y.o. male seen in ed with complaints of shortness of breath that has been going on since earlier today.  Has progressively gotten worse throughout the day.  EMS was called and while attempting to provide supplemental oxygen patient did not tolerate the facemask at that point reported shortness of breath.  Patient denies any other complaints of headache nausea diarrhea fevers chills abdominal pain chest pain palpitations.  Seen in consultation by cardiology and nephrology.  Per conversation with nephrology patient is known to their practice and kidney function has been deteriorating.  Decision was made after speaking with patient and family to initiate hemodialysis.  Vascular surgery involved in care.  Plan for catheter placement and initiation of HD 12/15.  Catheter successfully placed.  Patient initiated on hemodialysis.  Tolerating okay.  Has some mild pain at the dialysis catheter insertion site.  Blood pressures been difficult to control.  Remains suboptimal.   Assessment & Plan:   Principal Problem:   SOB (shortness of breath) Active Problems:   AKI (acute kidney injury) ([place])   Acute on chronic HFrEF (heart failure with reduced ejection fraction) (HCC)   Demand ischemia (HCC)   Anemia   Acute decompensated heart failure (HCC)   Acute on chronic systolic and diastolic congestive heart failure Acute hypoxic respiratory failure secondary to above Patient with known global heart failure Required BiPAP on admission Able to wean off of BiPAP 12/14 Respiratory status is improved  On 2 L Plan: Continue IV loop diuretic for now Dialysis per nephrology recommendations Nasal cannula as needed  Hypertensive urgency/emergency IV nitro drip discontinued before patient went to dialysis Blood pressure control remains  suboptimal Plan: Coreg 12.5 twice daily Imdur 30 mg daily Furosemide 40 twice daily Monitor and escalate regimen as necessary  Right axillary DVT Noted on ultrasound vein mapping Notified vascular surgery nephrology following.  Plan for PermCath placement and initiation of HD today  NSTEMI Uptrending troponins in the setting of decompensated heart failure On heparin gtt., will continue Plan for cardiac catheterization 12/16  Acute kidney injury on chronic kidney disease stage IV Patient known to the nephrology service I have explained the patient's kidney function has deteriorated to the point he will require hemodialysis Patient is agreeable Plan: PermCath in place right chest.  Initiated on hemodialysis per nephrology recommendations  Anemia of chronic kidney disease Hemoglobin stable Currently no indication for transfusion   DVT prophylaxis: Heparin GTT Code Status: Full Family Communication: Attempted to call spouse [name] [phone] on 12/14.  No answer.  Voicemail not left.  Discussed with spouse at bedside 12/15 Disposition Plan: Status is: Inpatient  Remains inpatient appropriate because: Acute decompensated heart failure.  New dialysis start.  Disposition plan pending.  Level of care: Progressive  Consultants:  Cardiology Nephrology Vascular surgery  Procedures:  None  Antimicrobials: None   Subjective: Seen and examined.  Complains about pain at chest catheter insertion site  Objective: Vitals:   06/03/21 1210 06/03/21 1215 06/03/21 1230 06/03/21 1305  BP: (!) 177/88 (!) 188/91 (!) 197/85   Pulse: 84 82 81 96  Resp: 17 19 18 14   Temp:  98.2 F (36.8 C)  98.1 F (36.7 C)  TempSrc:  Oral  Oral  SpO2: 100% 99% 100% 99%  Weight: 62.2 kg     Height:        Intake/Output  Summary (Last 24 hours) at 06/03/2021 1315 Last data filed at 06/03/2021 1210 Gross per 24 hour  Intake 645.3 ml  Output 1500 ml  Net -854.7 ml   Filed Weights   06/03/21  0418 06/03/21 0923 06/03/21 1210  Weight: 64.7 kg 64.1 kg 62.2 kg    Examination:  General exam: No apparent distress.  Appears nervous Respiratory system: Lungs clear.  Normal work breathing.  2 L Cardiovascular system: S1-S2, RRR, no murmurs, 1+ pitting edema, PermCath in place right chest Gastrointestinal system: Soft, NT/ND, normal bowel sounds Central nervous system: Alert and oriented. No focal neurological deficits. Extremities: Symmetric 5 x 5 power. Skin: No rashes, lesions or ulcers Psychiatry: Judgement and insight appear normal. Mood & affect appropriate.     Data Reviewed: I have personally reviewed following labs and imaging studies  CBC: Recent Labs  Lab 05/31/21 2210 06/01/21 0204 06/01/21 0445 06/02/21 0705 06/03/21 0648  WBC 8.9 8.2 7.0 5.1 4.8  NEUTROABS 4.9  --   --   --  2.8  HGB 9.5* 8.0* 7.5* 7.8* 8.0*  HCT 28.6* 24.1* 22.6* 22.9* 23.5*  MCV 91.4 90.3 89.3 87.4 88.0  PLT 231 183 181 196 300   Basic Metabolic Panel: Recent Labs  Lab 05/31/21 2210 06/01/21 0026 06/01/21 0030 06/02/21 0705 06/03/21 0648  NA 136  --   --  137 138  K 5.3*  --   --  5.1 4.3  CL 106  --   --  106 104  CO2 21*  --   --  23 27  GLUCOSE 295*  --   --  194* 206*  BUN 69*  --   --  76* 50*  CREATININE 4.61*  --  4.46* 4.69* 3.82*  CALCIUM 9.0  --   --  9.1 9.0  MG  --  1.9  --   --  1.9   GFR: Estimated Creatinine Clearance: 16.3 mL/min (A) (by C-G formula based on SCr of 3.82 mg/dL (H)). Liver Function Tests: No results for input(s): AST, ALT, ALKPHOS, BILITOT, PROT, ALBUMIN in the last 168 hours. No results for input(s): LIPASE, AMYLASE in the last 168 hours. No results for input(s): AMMONIA in the last 168 hours. Coagulation Profile: Recent Labs  Lab 06/01/21 0204  INR 1.1   Cardiac Enzymes: No results for input(s): CKTOTAL, CKMB, CKMBINDEX, TROPONINI in the last 168 hours. BNP (last 3 results) No results for input(s): PROBNP in the last 8760  hours. HbA1C: Recent Labs    06/01/21 0204  HGBA1C 8.7*   CBG: Recent Labs  Lab 06/02/21 0537 06/02/21 0943 06/02/21 1138 06/02/21 2036 06/03/21 0740  GLUCAP 153* 187* 185* 248* 199*   Lipid Profile: No results for input(s): CHOL, HDL, LDLCALC, TRIG, CHOLHDL, LDLDIRECT in the last 72 hours. Thyroid Function Tests: No results for input(s): TSH, T4TOTAL, FREET4, T3FREE, THYROIDAB in the last 72 hours. Anemia Panel: No results for input(s): VITAMINB12, FOLATE, FERRITIN, TIBC, IRON, RETICCTPCT in the last 72 hours. Sepsis Labs: No results for input(s): PROCALCITON, LATICACIDVEN in the last 168 hours.  Recent Results (from the past 240 hour(s))  Resp Panel by RT-PCR (Flu A&B, Covid) Nasopharyngeal Swab     Status: None   Collection Time: 05/31/21 10:10 PM   Specimen: Nasopharyngeal Swab; Nasopharyngeal(NP) swabs in vial transport medium  Result Value Ref Range Status   SARS Coronavirus 2 by RT PCR NEGATIVE NEGATIVE Final    Comment: (NOTE) SARS-CoV-2 target nucleic acids are NOT DETECTED.  The SARS-CoV-2  RNA is generally detectable in upper respiratory specimens during the acute phase of infection. The lowest concentration of SARS-CoV-2 viral copies this assay can detect is 138 copies/mL. A negative result does not preclude SARS-Cov-2 infection and should not be used as the sole basis for treatment or other patient management decisions. A negative result may occur with  improper specimen collection/handling, submission of specimen other than nasopharyngeal swab, presence of viral mutation(s) within the areas targeted by this assay, and inadequate number of viral copies(<138 copies/mL). A negative result must be combined with clinical observations, patient history, and epidemiological information. The expected result is Negative.  Fact Sheet for Patients:  EntrepreneurPulse.com.au  Fact Sheet for Healthcare Providers:   IncredibleEmployment.be  This test is no t yet approved or cleared by the [place] FDA and  has been authorized for detection and/or diagnosis of SARS-CoV-2 by FDA under an Emergency Use Authorization (EUA). This EUA will remain  in effect (meaning this test can be used) for the duration of the COVID-19 declaration under Section 564(b)(1) of the Act, 21 U.S.C.section 360bbb-3(b)(1), unless the authorization is terminated  or revoked sooner.       Influenza A by PCR NEGATIVE NEGATIVE Final   Influenza B by PCR NEGATIVE NEGATIVE Final    Comment: (NOTE) The Xpert Xpress SARS-CoV-2/FLU/RSV plus assay is intended as an aid in the diagnosis of influenza from Nasopharyngeal swab specimens and should not be used as a sole basis for treatment. Nasal washings and aspirates are unacceptable for Xpert Xpress SARS-CoV-2/FLU/RSV testing.  Fact Sheet for Patients: EntrepreneurPulse.com.au  Fact Sheet for Healthcare Providers: IncredibleEmployment.be  This test is not yet approved or cleared by the [place] FDA and has been authorized for detection and/or diagnosis of SARS-CoV-2 by FDA under an Emergency Use Authorization (EUA). This EUA will remain in effect (meaning this test can be used) for the duration of the COVID-19 declaration under Section 564(b)(1) of the Act, 21 U.S.C. section 360bbb-3(b)(1), unless the authorization is terminated or revoked.  Performed at [facility], [address], [place], [place] [address]          Radiology Studies: PERIPHERAL VASCULAR CATHETERIZATION  Result Date: 06/02/2021 See surgical note for result.  [place] UE VEIN MAPPING LEFT (PRE-OP AVF)  Result Date: 06/02/2021 CLINICAL DATA:  Preop evaluation for AV fistula creation EXAM: [place] EXTREM UP VEIN MAPPING COMPARISON:  None. FINDINGS: LEFT ARTERIES Wrist Radial Artery: Size 2.95mm Waveform triphasic Wrist Ulnar Artery: Size  2.62mm Waveform triphasic Prox. Forearm Radial Artery: Size 3.100mm Waveform triphasic Upper Arm Brachial Artery: Size 4.57mm Waveform triphasic LEFT VEINS Forearm Cephalic Vein: Prox 2.5EN Distal 2.68mm Depth 2.7PO Upper Arm Cephalic Vein: Prox 2.4MP Distal 4.83mm Depth 5.3-6.1WE Upper Arm Basilic Vein: Prox 3.1VQ Distal 3.73mm Depth 3.9-8.15mm Upper Arm Brachial Vein: Prox 4.42mm Distal 3.60mm Depth 8.5-14.61mm ADDITIONAL LEFT VEINS Axillary Vein:  6.53mm (occluded, noncompressible) Subclavian Vein: Patient: Yes Respiratory Phasicity: Present Internal Jugular Vein: Patent: Yes    Respiratory Phasicity: Present IMPRESSION: 1. DVT noted within the left axillary vein. 2. Left upper extremity vasculature measurements as above. These results will be called to the ordering clinician or representative by the Radiologist Assistant, and communication documented in the PACS or [employer]. Electronically Signed   By: Miachel Roux M.D.   On: 06/02/2021 09:27   ECHOCARDIOGRAM COMPLETE  Result Date: 06/02/2021    ECHOCARDIOGRAM REPORT   Patient Name:   [name] Date of Exam: 06/02/2021 Medical Rec #:  [id number]  Height:       62.0 in Accession #:    [id number]    Weight:       145.2 lb Date of Birth:  August 10, 1960     BSA:          1.668 m Patient Age:    55 years      BP:           155/77 mmHg Patient Gender: M             HR:           82 bpm. Exam Location:  [facility] Procedure: 2D Echo, Cardiac Doppler and Color Doppler Indications:     Cardiac arrest I 46.9  History:         Patient has no prior history of Echocardiogram examinations.                  CHF; Risk Factors:Hypertension and Diabetes.  Sonographer:     Sherrie Sport Referring Phys:  [place] Diagnosing Phys: Ida Rogue MD IMPRESSIONS  1. Left ventricular ejection fraction, by estimation, is 50 to 55%. The left ventricle has low normal function. The left ventricle has no regional wall motion abnormalities. There is mild left ventricular hypertrophy.  Left ventricular diastolic parameters are consistent with Grade I diastolic dysfunction (impaired relaxation).  2. Right ventricular systolic function is normal. The right ventricular size is normal. Tricuspid regurgitation signal is inadequate for assessing PA pressure.  3. The mitral valve is normal in structure. Mild mitral valve regurgitation. No evidence of mitral stenosis.  4. The aortic valve was not well visualized. Aortic valve regurgitation is not visualized. No aortic stenosis is present. FINDINGS  Left Ventricle: Left ventricular ejection fraction, by estimation, is 50 to 55%. The left ventricle has low normal function. The left ventricle has no regional wall motion abnormalities. The left ventricular internal cavity size was normal in size. There is mild left ventricular hypertrophy. Left ventricular diastolic parameters are consistent with Grade I diastolic dysfunction (impaired relaxation). Right Ventricle: The right ventricular size is normal. No increase in right ventricular wall thickness. Right ventricular systolic function is normal. Tricuspid regurgitation signal is inadequate for assessing PA pressure. Left Atrium: Left atrial size was normal in size. Right Atrium: Right atrial size was normal in size. Pericardium: There is no evidence of pericardial effusion. Mitral Valve: The mitral valve is normal in structure. Mild mitral valve regurgitation. No evidence of mitral valve stenosis. MV peak gradient, 6.0 mmHg. The mean mitral valve gradient is 2.0 mmHg. Tricuspid Valve: The tricuspid valve is normal in structure. Tricuspid valve regurgitation is not demonstrated. No evidence of tricuspid stenosis. Aortic Valve: The aortic valve was not well visualized. Aortic valve regurgitation is not visualized. No aortic stenosis is present. Aortic valve mean gradient measures 5.0 mmHg. Aortic valve peak gradient measures 7.7 mmHg. Aortic valve area, by VTI measures 1.90 cm. Pulmonic Valve: The pulmonic  valve was normal in structure. Pulmonic valve regurgitation is not visualized. No evidence of pulmonic stenosis. Aorta: The aortic root is normal in size and structure. Venous: The pulmonary veins were not well visualized. The inferior vena cava was not well visualized. IAS/Shunts: No atrial level shunt detected by color flow Doppler.  LEFT VENTRICLE PLAX 2D LVIDd:         5.30 cm   Diastology LVIDs:         3.80 cm   LV e' medial:    3.15 cm/s LV PW:  1.70 cm   LV E/e' medial:  25.0 LV IVS:        1.05 cm   LV e' lateral:   4.46 cm/s LVOT diam:     2.00 cm   LV E/e' lateral: 17.7 LV SV:         54 LV SV Index:   32 LVOT Area:     3.14 cm  RIGHT VENTRICLE RV Basal diam:  4.30 cm RV S prime:     14.30 cm/s TAPSE (M-mode): 4.5 cm LEFT ATRIUM             Index        RIGHT ATRIUM           Index LA diam:        3.60 cm 2.16 cm/m   RA Area:     13.50 cm LA Vol (A2C):   66.7 ml 39.98 ml/m  RA Volume:   32.50 ml  19.48 ml/m LA Vol (A4C):   66.8 ml 40.04 ml/m LA Biplane Vol: 67.3 ml 40.34 ml/m  AORTIC VALVE                     PULMONIC VALVE AV Area (Vmax):    1.98 cm      PV Vmax:        0.96 m/s AV Area (Vmean):   1.92 cm      PV Vmean:       59.600 cm/s AV Area (VTI):     1.90 cm      PV VTI:         0.157 m AV Vmax:           139.00 cm/s   PV Peak grad:   3.7 mmHg AV Vmean:          103.000 cm/s  PV Mean grad:   2.0 mmHg AV VTI:            0.284 m       RVOT Peak grad: 4 mmHg AV Peak Grad:      7.7 mmHg AV Mean Grad:      5.0 mmHg LVOT Vmax:         87.50 cm/s LVOT Vmean:        62.900 cm/s LVOT VTI:          0.172 m LVOT/AV VTI ratio: 0.61  AORTA Ao Root diam: 3.20 cm MITRAL VALVE                TRICUSPID VALVE MV Area (PHT): 3.46 cm     TR Peak grad:   14.3 mmHg MV Area VTI:   2.57 cm     TR Vmax:        189.00 cm/s MV Peak grad:  6.0 mmHg MV Mean grad:  2.0 mmHg     SHUNTS MV Vmax:       1.22 m/s     Systemic VTI:  0.17 m MV Vmean:      64.9 cm/s    Systemic Diam: 2.00 cm MV Decel Time: 219 msec      Pulmonic VTI:  0.168 m MV E velocity: 78.80 cm/s MV A velocity: 109.00 cm/s MV E/A ratio:  0.72 Ida Rogue MD Electronically signed by Ida Rogue MD Signature Date/Time: 06/02/2021/10:15:30 AM    Final         Scheduled Meds:  aspirin EC  81 mg Oral Daily   atorvastatin  80 mg Oral Daily  carvedilol  12.5 mg Oral BID   Chlorhexidine Gluconate Cloth  6 each Topical Q0600   docusate sodium  100 mg Oral BID   [START ON 06/04/2021] epoetin (EPOGEN/PROCRIT) injection  4,000 Units Intravenous Q T,Th,Sa-HD   furosemide  40 mg Intravenous BID   heparin sodium (porcine)       hydrALAZINE  100 mg Oral Q8H   insulin aspart  0-15 Units Subcutaneous TID WC   isosorbide mononitrate  30 mg Oral Daily   lidocaine  1 patch Transdermal Q24H   sodium chloride flush  3 mL Intravenous Q12H   sodium chloride flush  3 mL Intravenous Q12H   Continuous Infusions:  sodium chloride     [START ON 06/04/2021] sodium chloride 10 mL/hr at 06/03/21 1314   heparin 1,100 Units/hr (06/03/21 0857)     LOS: 2 days    Time spent: 25 minutes    Sidney Ace, MD [facility]   If 7PM-7AM, please contact night-coverage  06/03/2021, 1:15 PM

## 2021-06-03 NOTE — Progress Notes (Signed)
°   06/03/21 0939 06/03/21 0945 06/03/21 1000  Vitals  BP (!) 197/87 (!) 193/87 (!) 188/93  MAP (mmHg) 118 118 119  Pulse Rate 83 81 80  ECG Heart Rate 83 81 80  Resp (!) 21 15 14   During Hemodialysis Assessment  Blood Flow Rate (mL/min) 250 mL/min 250 mL/min 250 mL/min  Arterial Pressure (mmHg) -60 mmHg -60 mmHg -60 mmHg  Venous Pressure (mmHg) 60 mmHg 60 mmHg 60 mmHg  Transmembrane Pressure (mmHg) 40 mmHg 40 mmHg 40 mmHg  Ultrafiltration Rate (mL/min) 200 mL/min 200 mL/min 200 mL/min  Dialysate Flow Rate (mL/min) 300 ml/min 300 ml/min 300 ml/min  Conductivity: Machine  13.9 13.9 13.9  HD Safety Checks Performed Yes Yes Yes  Dialysis Fluid Bolus Normal Saline  --   --   Bolus Amount (mL) 250 mL  --   --   Intra-Hemodialysis Comments Tx initiated Progressing as prescribed Progressing as prescribed    06/03/21 1015 06/03/21 1030  Vitals  BP (!) 179/83 (!) 166/84  MAP (mmHg) 108 109  Pulse Rate 79 79  ECG Heart Rate 80 79  Resp 15 17  During Hemodialysis Assessment  Blood Flow Rate (mL/min) 250 mL/min 250 mL/min  Arterial Pressure (mmHg) -60 mmHg -70 mmHg  Venous Pressure (mmHg) 60 mmHg 60 mmHg  Transmembrane Pressure (mmHg) 40 mmHg 30 mmHg  Ultrafiltration Rate (mL/min) 200 mL/min 510 mL/min (pt was seen by dr Candiss Norse and Np shantelle with new order to remove 531ml of fluid  (uF goal .5L) UF rate then increased)  Dialysate Flow Rate (mL/min) 300 ml/min 300 ml/min  Conductivity: Machine  13.9 13.9  HD Safety Checks Performed Yes Yes  Dialysis Fluid Bolus  --   --   Bolus Amount (mL)  --   --   Intra-Hemodialysis Comments Progressing as prescribed Progressing as prescribed

## 2021-06-04 LAB — CBC
HCT: 24.3 % — ABNORMAL LOW (ref 39.0–52.0)
Hemoglobin: 8.3 g/dL — ABNORMAL LOW (ref 13.0–17.0)
MCH: 29.7 pg (ref 26.0–34.0)
MCHC: 34.2 g/dL (ref 30.0–36.0)
MCV: 87.1 fL (ref 80.0–100.0)
Platelets: 178 10*3/uL (ref 150–400)
RBC: 2.79 MIL/uL — ABNORMAL LOW (ref 4.22–5.81)
RDW: 11.9 % (ref 11.5–15.5)
WBC: 5.2 10*3/uL (ref 4.0–10.5)
nRBC: 0 % (ref 0.0–0.2)

## 2021-06-04 LAB — GLUCOSE, CAPILLARY
Glucose-Capillary: 209 mg/dL — ABNORMAL HIGH (ref 70–99)
Glucose-Capillary: 189 mg/dL — ABNORMAL HIGH (ref 70–99)
Glucose-Capillary: 250 mg/dL — ABNORMAL HIGH (ref 70–99)
Glucose-Capillary: 167 mg/dL — ABNORMAL HIGH (ref 70–99)
Glucose-Capillary: 148 mg/dL — ABNORMAL HIGH (ref 70–99)

## 2021-06-04 MED ORDER — HEPARIN SODIUM (PORCINE) 5000 UNIT/ML IJ SOLN
5000.0000 [IU] | Freq: Three times a day (TID) | INTRAMUSCULAR | Status: DC
  Administered 2021-06-04 – 2021-06-06 (×5): 5000 [IU] via SUBCUTANEOUS
  Filled 2021-06-04 (×6): qty 1

## 2021-06-04 NOTE — Plan of Care (Signed)
  Problem: Activity: Goal: Ability to return to baseline activity level will improve Outcome: Progressing   

## 2021-06-04 NOTE — Progress Notes (Signed)
PROGRESS NOTE    PHU RECORD  VZC:[id number] DOB: [birth year] DOA: 05/31/2021 PCP: [facility]    Brief Narrative:   60 y.o. male seen in ed with complaints of shortness of breath that has been going on since earlier today.  Has progressively gotten worse throughout the day.  EMS was called and while attempting to provide supplemental oxygen patient did not tolerate the facemask at that point reported shortness of breath.  Patient denies any other complaints of headache nausea diarrhea fevers chills abdominal pain chest pain palpitations.  Seen in consultation by cardiology and nephrology.  Per conversation with nephrology patient is known to their practice and kidney function has been deteriorating.  Decision was made after speaking with patient and family to initiate hemodialysis.  Vascular surgery involved in care.  Plan for catheter placement and initiation of HD 12/15.  Catheter successfully placed.  Patient initiated on hemodialysis.  Tolerating okay.  Has some mild pain at the dialysis catheter insertion site.  Blood pressures been difficult to control.  Remains suboptimal.   Assessment & Plan:   Principal Problem:   SOB (shortness of breath) Active Problems:   AKI (acute kidney injury) ([facility])   Acute on chronic HFrEF (heart failure with reduced ejection fraction) (HCC)   Demand ischemia (HCC)   Anemia   Acute decompensated heart failure (HCC)   Acute on chronic systolic and diastolic congestive heart failure Acute hypoxic respiratory failure secondary to above Patient with known global heart failure Required BiPAP on admission Able to wean off of BiPAP 12/14 Respiratory status is improved  Weaned to room air as of 12/17 Plan: Continue IV loop diuretic for now Dialysis per nephrology recommendations Nasal cannula as needed  Hypertensive urgency/emergency IV nitro drip discontinued before patient went to dialysis Blood pressure control remains  suboptimal Plan: Coreg 12.5 twice daily Imdur 30 mg daily Hydralazine 100 3 times daily Furosemide 40 twice daily Monitor and escalate regimen as necessary Dialysis should help with blood pressure control  Right axillary DVT Noted on ultrasound vein mapping Notified vascular surgery nephrology following May require therapeutic anticoagulation Will discuss with nephrology and vascular surgery  NSTEMI Uptrending troponins in the setting of decompensated heart failure Heparin drip stopped by cardiology  Acute kidney injury on chronic kidney disease stage IV Patient known to the nephrology service I have explained the patient's kidney function has deteriorated to the point he will require hemodialysis Patient is agreeable Plan: PermCath in place right chest.   Continue hemodialysis per nephrology recommendations  Anemia of chronic kidney disease Hemoglobin stable Currently no indication for transfusion   DVT prophylaxis: Heparin GTT Code Status: Full Family Communication: Attempted to call spouse [name] [phone] on 12/14.  No answer.  Voicemail not left.  Discussed with spouse at bedside 12/15 Disposition Plan: Status is: Inpatient  Remains inpatient appropriate because: Acute decompensated heart failure.  New dialysis start.  Disposition plan pending.  Level of care: Progressive  Consultants:  Cardiology Nephrology Vascular surgery  Procedures:  None  Antimicrobials: None   Subjective: Seen and examined.  Feeling better.  Improved blood pressure control  Objective: Vitals:   06/04/21 0355 06/04/21 0500 06/04/21 0700 06/04/21 0735  BP: (!) 158/79  (!) 147/67   Pulse: 86  85   Resp: 16  12   Temp: 98.2 F (36.8 C)   98.7 F (37.1 C)  TempSrc: Oral   Oral  SpO2: 97%  98%   Weight:  63.9 kg  Height:        Intake/Output Summary (Last 24 hours) at 06/04/2021 1212 Last data filed at 06/03/2021 2300 Gross per 24 hour  Intake 369.53 ml  Output 900  ml  Net -530.47 ml   Filed Weights   06/03/21 0923 06/03/21 1210 06/04/21 0500  Weight: 64.1 kg 62.2 kg 63.9 kg    Examination:  General exam: No acute distress.  Appears fatigued Respiratory system: Clear lungs.  Normal work of breathing.  Room air Cardiovascular system: S1-S2, RRR, no murmurs, no pedal edema Gastrointestinal system: Soft, NT/ND, normal bowel sounds Central nervous system: Alert and oriented. No focal neurological deficits. Extremities: Symmetric 5 x 5 power. Skin: No rashes, lesions or ulcers Psychiatry: Judgement and insight appear normal. Mood & affect appropriate.     Data Reviewed: I have personally reviewed following labs and imaging studies  CBC: Recent Labs  Lab 05/31/21 2210 06/01/21 0204 06/01/21 0445 06/02/21 0705 06/03/21 0648 06/04/21 0431  WBC 8.9 8.2 7.0 5.1 4.8 5.2  NEUTROABS 4.9  --   --   --  2.8  --   HGB 9.5* 8.0* 7.5* 7.8* 8.0* 8.3*  HCT 28.6* 24.1* 22.6* 22.9* 23.5* 24.3*  MCV 91.4 90.3 89.3 87.4 88.0 87.1  PLT 231 183 181 196 180 035   Basic Metabolic Panel: Recent Labs  Lab 05/31/21 2210 06/01/21 0026 06/01/21 0030 06/02/21 0705 06/03/21 0648  NA 136  --   --  137 138  K 5.3*  --   --  5.1 4.3  CL 106  --   --  106 104  CO2 21*  --   --  23 27  GLUCOSE 295*  --   --  194* 206*  BUN 69*  --   --  76* 50*  CREATININE 4.61*  --  4.46* 4.69* 3.82*  CALCIUM 9.0  --   --  9.1 9.0  MG  --  1.9  --   --  1.9   GFR: Estimated Creatinine Clearance: 16.3 mL/min (A) (by C-G formula based on SCr of 3.82 mg/dL (H)). Liver Function Tests: No results for input(s): AST, ALT, ALKPHOS, BILITOT, PROT, ALBUMIN in the last 168 hours. No results for input(s): LIPASE, AMYLASE in the last 168 hours. No results for input(s): AMMONIA in the last 168 hours. Coagulation Profile: Recent Labs  Lab 06/01/21 0204  INR 1.1   Cardiac Enzymes: No results for input(s): CKTOTAL, CKMB, CKMBINDEX, TROPONINI in the last 168 hours. BNP (last 3  results) No results for input(s): PROBNP in the last 8760 hours. HbA1C: No results for input(s): HGBA1C in the last 72 hours.  CBG: Recent Labs  Lab 06/03/21 1723 06/03/21 2112 06/04/21 0548 06/04/21 0818 06/04/21 1140  GLUCAP 234* 281* 209* 189* 250*   Lipid Profile: No results for input(s): CHOL, HDL, LDLCALC, TRIG, CHOLHDL, LDLDIRECT in the last 72 hours. Thyroid Function Tests: No results for input(s): TSH, T4TOTAL, FREET4, T3FREE, THYROIDAB in the last 72 hours. Anemia Panel: No results for input(s): VITAMINB12, FOLATE, FERRITIN, TIBC, IRON, RETICCTPCT in the last 72 hours. Sepsis Labs: No results for input(s): PROCALCITON, LATICACIDVEN in the last 168 hours.  Recent Results (from the past 240 hour(s))  Resp Panel by RT-PCR (Flu A&B, Covid) Nasopharyngeal Swab     Status: None   Collection Time: 05/31/21 10:10 PM   Specimen: Nasopharyngeal Swab; Nasopharyngeal(NP) swabs in vial transport medium  Result Value Ref Range Status   SARS Coronavirus 2 by RT PCR NEGATIVE NEGATIVE Final  Comment: (NOTE) SARS-CoV-2 target nucleic acids are NOT DETECTED.  The SARS-CoV-2 RNA is generally detectable in upper respiratory specimens during the acute phase of infection. The lowest concentration of SARS-CoV-2 viral copies this assay can detect is 138 copies/mL. A negative result does not preclude SARS-Cov-2 infection and should not be used as the sole basis for treatment or other patient management decisions. A negative result may occur with  improper specimen collection/handling, submission of specimen other than nasopharyngeal swab, presence of viral mutation(s) within the areas targeted by this assay, and inadequate number of viral copies(<138 copies/mL). A negative result must be combined with clinical observations, patient history, and epidemiological information. The expected result is Negative.  Fact Sheet for Patients:  EntrepreneurPulse.com.au  Fact  Sheet for Healthcare Providers:  IncredibleEmployment.be  This test is no t yet approved or cleared by the [place] FDA and  has been authorized for detection and/or diagnosis of SARS-CoV-2 by FDA under an Emergency Use Authorization (EUA). This EUA will remain  in effect (meaning this test can be used) for the duration of the COVID-19 declaration under Section 564(b)(1) of the Act, 21 U.S.C.section 360bbb-3(b)(1), unless the authorization is terminated  or revoked sooner.       Influenza A by PCR NEGATIVE NEGATIVE Final   Influenza B by PCR NEGATIVE NEGATIVE Final    Comment: (NOTE) The Xpert Xpress SARS-CoV-2/FLU/RSV plus assay is intended as an aid in the diagnosis of influenza from Nasopharyngeal swab specimens and should not be used as a sole basis for treatment. Nasal washings and aspirates are unacceptable for Xpert Xpress SARS-CoV-2/FLU/RSV testing.  Fact Sheet for Patients: EntrepreneurPulse.com.au  Fact Sheet for Healthcare Providers: IncredibleEmployment.be  This test is not yet approved or cleared by the [place] FDA and has been authorized for detection and/or diagnosis of SARS-CoV-2 by FDA under an Emergency Use Authorization (EUA). This EUA will remain in effect (meaning this test can be used) for the duration of the COVID-19 declaration under Section 564(b)(1) of the Act, 21 U.S.C. section 360bbb-3(b)(1), unless the authorization is terminated or revoked.  Performed at [facility], [address] [place], [place] [address]          Radiology Studies: CARDIAC CATHETERIZATION  Result Date: 06/03/2021   Prox LAD lesion is 40% stenosed.   Mid LAD lesion is 60% stenosed.   Prox RCA lesion is 20% stenosed.   Mid RCA lesion is 30% stenosed.   1st Diag lesion is 70% stenosed.   Dist RCA lesion is 30% stenosed.   RPDA lesion is 40% stenosed.   There is mild left ventricular systolic  dysfunction.   LV end diastolic pressure is mildly elevated.   The left ventricular ejection fraction is 45-50% by visual estimate. 1.  Heavily calcified coronary arteries with moderate two-vessel coronary artery disease involving the LAD and right coronary artery.  No culprit is identified for non-STEMI. 2.  Mildly reduced LV systolic function and mildly elevated left ventricular end-diastolic pressure. Recommendations: The patient disease appears to be chronic with no acute culprit identified.  Suspect that elevated troponin is due to supply demand ischemia.  FFR guided PCI of the LAD can be considered in the future if the patient has refractory anginal symptoms.  For now, recommend aggressive medical therapy.   PERIPHERAL VASCULAR CATHETERIZATION  Result Date: 06/02/2021 See surgical note for result.       Scheduled Meds:  aspirin EC  81 mg Oral Daily   atorvastatin  80 mg Oral  Daily   carvedilol  12.5 mg Oral BID   Chlorhexidine Gluconate Cloth  6 each Topical Q0600   docusate sodium  100 mg Oral BID   epoetin (EPOGEN/PROCRIT) injection  4,000 Units Intravenous Q T,Th,Sa-HD   furosemide  40 mg Intravenous BID   hydrALAZINE  100 mg Oral Q8H   insulin aspart  0-15 Units Subcutaneous TID WC   isosorbide mononitrate  30 mg Oral Daily   lidocaine  1 patch Transdermal Q24H   sodium chloride flush  3 mL Intravenous Q12H   sodium chloride flush  3 mL Intravenous Q12H   Continuous Infusions:  sodium chloride       LOS: 3 days    Time spent: 25 minutes    Sidney Ace, MD [facility]   If 7PM-7AM, please contact night-coverage  06/04/2021, 12:12 PM

## 2021-06-04 NOTE — Progress Notes (Addendum)
Progress Note  Patient Name: Jimmy Olson Date of Encounter: 06/04/2021  [facility] Cardiologist: Ida Rogue, MD   Patient Profile     60 y.o. male with history of presumably nonischemic cardiomyopathy (EF out of proportion to moderate CAD noted by catheterization January 2022) with CKD-5/ESRD on HD, DM2, HTN, HLD, and anemia of chronic disease admitted with dyspnea and pulmonary edema consistent with acute decompensated CHF/volume overload.  He has been seen by nephrology and vascular surgery and has undergone dialysis catheter placement in order to initiate dialysis this hospitalization.  He haas had elevated troponin levels over 4000 (non-STEMI versus demand ischemia with CKD/ESRD).  LHC performed 06/03/21 which showed heavily calcified coronary arteries with moderate two-vessel coronary artery disease involving the LAD and right coronary artery.  No significant change overall from previous catheterization.    Subjective   The patient reports no chest pain or shortness of breath.  Blood pressure improving.  Inpatient Medications    Scheduled Meds:  aspirin EC  81 mg Oral Daily   atorvastatin  80 mg Oral Daily   carvedilol  12.5 mg Oral BID   Chlorhexidine Gluconate Cloth  6 each Topical Q0600   docusate sodium  100 mg Oral BID   epoetin (EPOGEN/PROCRIT) injection  4,000 Units Intravenous Q T,Th,Sa-HD   furosemide  40 mg Intravenous BID   hydrALAZINE  100 mg Oral Q8H   insulin aspart  0-15 Units Subcutaneous TID WC   isosorbide mononitrate  30 mg Oral Daily   lidocaine  1 patch Transdermal Q24H   sodium chloride flush  3 mL Intravenous Q12H   sodium chloride flush  3 mL Intravenous Q12H   Continuous Infusions:  sodium chloride     PRN Meds: sodium chloride, acetaminophen **OR** acetaminophen, albuterol, hydrALAZINE, HYDROmorphone (DILAUDID) injection, LORazepam, morphine injection, ondansetron **OR** ondansetron (ZOFRAN) IV, ondansetron (ZOFRAN) IV, oxyCODONE,  polyethylene glycol, sodium chloride flush   Vital Signs    Vitals:   06/04/21 0000 06/04/21 0355 06/04/21 0500 06/04/21 0735  BP: 139/69 (!) 158/79    Pulse: 79 86    Resp: 12 16    Temp:  98.2 F (36.8 C)  98.7 F (37.1 C)  TempSrc:  Oral  Oral  SpO2: 97% 97%    Weight:   63.9 kg   Height:        Intake/Output Summary (Last 24 hours) at 06/04/2021 0741 Last data filed at 06/03/2021 2300 Gross per 24 hour  Intake 369.53 ml  Output 1400 ml  Net -1030.47 ml    Last 3 Weights 06/04/2021 06/03/2021 06/03/2021  Weight (lbs) 140 lb 14.4 oz 137 lb 2 oz 141 lb 5 oz  Weight (kg) 63.912 kg 62.2 kg 64.1 kg      Telemetry    SR - Personally Reviewed  ECG    No new tracings - Personally Reviewed  Physical Exam   GEN: No acute distress.  Resting comfortably in bed Neck: No JVD or HJR. Cardiac: RRR, normal S1-S2.  No M/R/G.  Right radial cardiac cath site without bleeding, bruising, swelling, warmth, erythema, or TTP.  Radial pulse 2+ proximal and distal to the arteriotomy site.  Respiratory: Clear to auscultation bilaterally. GI  Nonlabored, good air movement.: Soft, nontender, non-distended  MS: No edema; No deformity. Neuro:  Nonfocal  Psych: Normal affect   Labs    High Sensitivity Troponin:   Recent Labs  Lab 05/31/21 2348 06/01/21 0204 06/01/21 0445 06/01/21 0746 06/01/21 1002  TROPONINIHS 370* 1,372*  2,862* 3,611* 4,844*      Chemistry Recent Labs  Lab 05/31/21 2210 06/01/21 0026 06/01/21 0030 06/02/21 0705 06/03/21 0648  NA 136  --   --  137 138  K 5.3*  --   --  5.1 4.3  CL 106  --   --  106 104  CO2 21*  --   --  23 27  GLUCOSE 295*  --   --  194* 206*  BUN 69*  --   --  76* 50*  CREATININE 4.61*  --  4.46* 4.69* 3.82*  CALCIUM 9.0  --   --  9.1 9.0  MG  --  1.9  --   --  1.9  GFRNONAA 14*  --  15* 14* 17*  ANIONGAP 9  --   --  8 7     Lipids No results for input(s): CHOL, TRIG, HDL, LABVLDL, LDLCALC, CHOLHDL in the last 168 hours.   Hematology Recent Labs  Lab 06/02/21 0705 06/03/21 0648 06/04/21 0431  WBC 5.1 4.8 5.2  RBC 2.62* 2.67* 2.79*  HGB 7.8* 8.0* 8.3*  HCT 22.9* 23.5* 24.3*  MCV 87.4 88.0 87.1  MCH 29.8 30.0 29.7  MCHC 34.1 34.0 34.2  RDW 12.1 12.0 11.9  PLT 196 180 178    Thyroid No results for input(s): TSH, FREET4 in the last 168 hours.  BNP Recent Labs  Lab 05/31/21 2210  BNP 1,820.5*     DDimer No results for input(s): DDIMER in the last 168 hours.   Radiology    PERIPHERAL VASCULAR CATHETERIZATION  Result Date: 06/02/2021 See surgical note for result.  [place] UE VEIN MAPPING LEFT (PRE-OP AVF)  Result Date: 06/02/2021 IMPRESSION: 1. DVT noted within the left axillary vein. 2. Left upper extremity vasculature measurements as above. These results will be called to the ordering clinician or representative by the Radiologist Assistant, and communication documented in the PACS or [employer]. Electronically Signed   By: Miachel Roux M.D.   On: 06/02/2021 09:27    Cardiac Studies   Cardiac Cath January 2022: pRCA 20%, mRCA 30%, dRCA 50% involving 50% rPAV. pLAD ~40% &D1 70%, mLAD 60% (not flow limiting). RHC - mildly elevated filling pressures - mild pulm HTN with normal CO/CI.   Echo completed - not yet read -> EF appears to be notably improved compared with 30 to 35% seen in January 2022.  No obvious wall motion normalities.  Does berry to be somewhat dilated, but EF at least 45%, visual estimation.  Still appears to have GRII DD.   Assessment & Plan    1.  Acute on chronic systolic heart failure: Mildly reduced LV systolic function.  EF appears to be improved compared to before.  Suspect that his cardiomyopathy is due to hypertensive heart disease.  Fluid management per dialysis and nephrology.  Optimize heart failure treatment.  Continue carvedilol as well as a combination of Imdur and hydralazine.  Not on ACEi/ARB/ARNI/SGLT2i secondary to ESRD on HD.  If blood pressure remains  elevated, we could consider adding Entresto now that he will be on permanent dialysis.  Would like to see his BP trends on HD prior to initiating Entresto.  CHF education.    2.  Elevated troponin likely due to supply demand ischemia.  Cardiac catheterization was done 06/03/2021 and showed no acute culprit lesions.  The patient does have heavily calcified coronary arteries with moderate two-vessel coronary artery disease that requires aggressive medical therapy.  LAD disease appears borderline,  and thus if the patient has refractory angina in the future, FFR guided PCI and atherectomy can be considered.  Continue low-dose aspirin and high-dose atorvastatin.  3.  End-stage renal disease: He is now on hemodialysis.    For questions or updates, please contact [place] Please consult [email or web address] for contact info under        Signed, Christell Faith, PA-C  06/04/2021, 7:41 AM    Cardiology Attending  Patient seen and examined. Agree with the findings as noted above. On exam he is a pleasant 61 yo man NAD with minimal rales, RRR and no edema. Indwelling catheter is in place.  A/P A on C systolic heart failure - his dyspnea is much improved with the initiation of HD. Ok to DC from my perspective once HD is arranged. NSTEMI - his troponin elevation is due to demand from CHF in the setting of 2 vessel disease. Cath results noted. Continue secondary prevention is high dose statin therapy.  Disp - ok for DC from my perspective. His bp is not perfect but should continue to improve with HD and medication titration.   Carleene Overlie Johnel Yielding,MD

## 2021-06-04 NOTE — Progress Notes (Signed)
OT Cancellation Note  Patient Details Name: Jimmy Olson MRN: [id number] DOB: 12-Jan-1961   Cancelled Treatment:    Reason Eval/Treat Not Completed: Other (comment) Pt politely declines citing pain and states he hasn't eaten in 3 days. OT attempted to call nutrition x2 with pt's dinner request with no answer so called nurse and wrote order down at nurse's station.   Gerrianne Scale, [place], OTR/L ascom [phone] 06/04/21, 3:59 PM

## 2021-06-04 NOTE — Progress Notes (Signed)
[facility]  ROUNDING NOTE   Subjective:   Jimmy Olson is a 60 year old male with a past medical history consisting of coronary artery disease, diabetes, hypertension, chronic kidney disease stage IV.  He presents to the emergency department with complaints of shortness of breath.  He will be admitted for SOB (shortness of breath) [R06.02] Kidney damage [S37.009A] Acute decompensated heart failure ([place]) [I50.9] Hypertension, unspecified type [I10] Congestive heart failure, unspecified HF chronicity, unspecified heart failure type ([place]) [I50.9]  Patient seen resting in bed Alert and oriented Tolerating small meals, frustrated with ordered diet Weaned to room air   Third dialysis treatment planned today seated in chair   Objective:  Vital signs in last 24 hours:  Temp:  [98 F (36.7 C)-98.7 F (37.1 C)] 98.7 F (37.1 C) (12/17 0735) Pulse Rate:  [79-96] 85 (12/17 0700) Resp:  [11-19] 12 (12/17 0700) BP: (139-202)/(67-94) 147/67 (12/17 0700) SpO2:  [97 %-100 %] 98 % (12/17 0700) Weight:  [62.2 kg-63.9 kg] 63.9 kg (12/17 0500)  Weight change: -2.5 kg Filed Weights   06/03/21 0923 06/03/21 1210 06/04/21 0500  Weight: 64.1 kg 62.2 kg 63.9 kg    Intake/Output: I/O last 3 completed shifts: In: 495.5 [P.O.:240; I.V.:255.5] Out: 2400 [Urine:1900; Other:500]   Intake/Output this shift:  No intake/output data recorded.  Physical Exam: General: NAD, resting in bed  Head: Normocephalic, atraumatic. Moist oral mucosal membranes  Eyes: Anicteric  Lungs:  Diminished in bases, normal effort  Heart: Regular rate and rhythm  Abdomen:  Soft, nontender  Extremities:  no peripheral edema.  Neurologic: Nonfocal, moving all four extremities  Skin: No lesions  Access: Rt IJ Permcath    Basic Metabolic Panel: Recent Labs  Lab 05/31/21 2210 06/01/21 0026 06/01/21 0030 06/02/21 0705 06/03/21 0648  NA 136  --   --  137 138  K 5.3*  --   --  5.1 4.3  CL 106  --    --  106 104  CO2 21*  --   --  23 27  GLUCOSE 295*  --   --  194* 206*  BUN 69*  --   --  76* 50*  CREATININE 4.61*  --  4.46* 4.69* 3.82*  CALCIUM 9.0  --   --  9.1 9.0  MG  --  1.9  --   --  1.9     Liver Function Tests: No results for input(s): AST, ALT, ALKPHOS, BILITOT, PROT, ALBUMIN in the last 168 hours. No results for input(s): LIPASE, AMYLASE in the last 168 hours. No results for input(s): AMMONIA in the last 168 hours.  CBC: Recent Labs  Lab 05/31/21 2210 06/01/21 0204 06/01/21 0445 06/02/21 0705 06/03/21 0648 06/04/21 0431  WBC 8.9 8.2 7.0 5.1 4.8 5.2  NEUTROABS 4.9  --   --   --  2.8  --   HGB 9.5* 8.0* 7.5* 7.8* 8.0* 8.3*  HCT 28.6* 24.1* 22.6* 22.9* 23.5* 24.3*  MCV 91.4 90.3 89.3 87.4 88.0 87.1  PLT 231 183 181 196 180 178     Cardiac Enzymes: No results for input(s): CKTOTAL, CKMB, CKMBINDEX, TROPONINI in the last 168 hours.  BNP: Invalid input(s): POCBNP  CBG: Recent Labs  Lab 06/03/21 0740 06/03/21 1723 06/03/21 2112 06/04/21 0548 06/04/21 0818  GLUCAP 199* 234* 281* 209* 189*     Microbiology: Results for orders placed or performed during the hospital encounter of 05/31/21  Resp Panel by RT-PCR (Flu A&B, Covid) Nasopharyngeal Swab  Status: None   Collection Time: 05/31/21 10:10 PM   Specimen: Nasopharyngeal Swab; Nasopharyngeal(NP) swabs in vial transport medium  Result Value Ref Range Status   SARS Coronavirus 2 by RT PCR NEGATIVE NEGATIVE Final    Comment: (NOTE) SARS-CoV-2 target nucleic acids are NOT DETECTED.  The SARS-CoV-2 RNA is generally detectable in upper respiratory specimens during the acute phase of infection. The lowest concentration of SARS-CoV-2 viral copies this assay can detect is 138 copies/mL. A negative result does not preclude SARS-Cov-2 infection and should not be used as the sole basis for treatment or other patient management decisions. A negative result may occur with  improper specimen  collection/handling, submission of specimen other than nasopharyngeal swab, presence of viral mutation(s) within the areas targeted by this assay, and inadequate number of viral copies(<138 copies/mL). A negative result must be combined with clinical observations, patient history, and epidemiological information. The expected result is Negative.  Fact Sheet for Patients:  EntrepreneurPulse.com.au  Fact Sheet for Healthcare Providers:  IncredibleEmployment.be  This test is no t yet approved or cleared by the [place] FDA and  has been authorized for detection and/or diagnosis of SARS-CoV-2 by FDA under an Emergency Use Authorization (EUA). This EUA will remain  in effect (meaning this test can be used) for the duration of the COVID-19 declaration under Section 564(b)(1) of the Act, 21 U.S.C.section 360bbb-3(b)(1), unless the authorization is terminated  or revoked sooner.       Influenza A by PCR NEGATIVE NEGATIVE Final   Influenza B by PCR NEGATIVE NEGATIVE Final    Comment: (NOTE) The Xpert Xpress SARS-CoV-2/FLU/RSV plus assay is intended as an aid in the diagnosis of influenza from Nasopharyngeal swab specimens and should not be used as a sole basis for treatment. Nasal washings and aspirates are unacceptable for Xpert Xpress SARS-CoV-2/FLU/RSV testing.  Fact Sheet for Patients: EntrepreneurPulse.com.au  Fact Sheet for Healthcare Providers: IncredibleEmployment.be  This test is not yet approved or cleared by the [place] FDA and has been authorized for detection and/or diagnosis of SARS-CoV-2 by FDA under an Emergency Use Authorization (EUA). This EUA will remain in effect (meaning this test can be used) for the duration of the COVID-19 declaration under Section 564(b)(1) of the Act, 21 U.S.C. section 360bbb-3(b)(1), unless the authorization is terminated or revoked.  Performed at [facility], [address] [place], [place] [address]     Coagulation Studies: No results for input(s): LABPROT, INR in the last 72 hours.   Urinalysis: No results for input(s): COLORURINE, LABSPEC, PHURINE, GLUCOSEU, HGBUR, BILIRUBINUR, KETONESUR, PROTEINUR, UROBILINOGEN, NITRITE, LEUKOCYTESUR in the last 72 hours.  Invalid input(s): APPERANCEUR    Imaging: CARDIAC CATHETERIZATION  Result Date: 06/03/2021   Prox LAD lesion is 40% stenosed.   Mid LAD lesion is 60% stenosed.   Prox RCA lesion is 20% stenosed.   Mid RCA lesion is 30% stenosed.   1st Diag lesion is 70% stenosed.   Dist RCA lesion is 30% stenosed.   RPDA lesion is 40% stenosed.   There is mild left ventricular systolic dysfunction.   LV end diastolic pressure is mildly elevated.   The left ventricular ejection fraction is 45-50% by visual estimate. 1.  Heavily calcified coronary arteries with moderate two-vessel coronary artery disease involving the LAD and right coronary artery.  No culprit is identified for non-STEMI. 2.  Mildly reduced LV systolic function and mildly elevated left ventricular end-diastolic pressure. Recommendations: The patient disease appears to be chronic with no acute  culprit identified.  Suspect that elevated troponin is due to supply demand ischemia.  FFR guided PCI of the LAD can be considered in the future if the patient has refractory anginal symptoms.  For now, recommend aggressive medical therapy.   PERIPHERAL VASCULAR CATHETERIZATION  Result Date: 06/02/2021 See surgical note for result.    Medications:    sodium chloride      aspirin EC  81 mg Oral Daily   atorvastatin  80 mg Oral Daily   carvedilol  12.5 mg Oral BID   Chlorhexidine Gluconate Cloth  6 each Topical Q0600   docusate sodium  100 mg Oral BID   epoetin (EPOGEN/PROCRIT) injection  4,000 Units Intravenous Q T,Th,Sa-HD   furosemide  40 mg Intravenous BID   hydrALAZINE  100 mg Oral Q8H   insulin aspart  0-15 Units  Subcutaneous TID WC   isosorbide mononitrate  30 mg Oral Daily   lidocaine  1 patch Transdermal Q24H   sodium chloride flush  3 mL Intravenous Q12H   sodium chloride flush  3 mL Intravenous Q12H   sodium chloride, acetaminophen **OR** acetaminophen, albuterol, hydrALAZINE, HYDROmorphone (DILAUDID) injection, LORazepam, morphine injection, ondansetron **OR** ondansetron (ZOFRAN) IV, ondansetron (ZOFRAN) IV, oxyCODONE, polyethylene glycol, sodium chloride flush  Assessment/ Plan:  Mr. Jimmy Olson is a 60 y.o.  male ith a past medical history consisting of coronary artery disease, diabetes, hypertension, chronic kidney disease stage IV.  He presents to the emergency department with complaints of shortness of breath.  He will be admitted for SOB (shortness of breath) [R06.02] Kidney damage [S37.009A] Acute decompensated heart failure ([facility]) [I50.9] Hypertension, unspecified type [I10] Congestive heart failure, unspecified HF chronicity, unspecified heart failure type ([place]) [I50.9]   End-stage renal disease requiring hemodialysis.  Based on chart review and recent office note.  We feel patient kidney disease has progressed to stage V requiring initiation of renal replacement therapy. Appreciate vascular placing Permcath 06/02/21 and dialysis initiated on that day.   -Second dialysis treatment received yesterday, UF goal 500 mL removed successfully.  Patient tolerated treatment well  -Third treatment scheduled today to be completed seated in chair.  Patient adamant about seeing his wife today and refuses to complete treatment today.  Plans to dialyze on Monday.  This treatment will be completed with him seated in chair to prepare for outpatient placement.  -Dialysis coordinator aware of patient and outpatient clinic search in progress.   2. Anemia of chronic kidney disease Lab Results  Component Value Date   HGB 8.3 (L) 06/04/2021    Hemoglobin below goal.  Low-dose EPO ordered with  treatments  3. Secondary Hyperparathyroidism:  Lab Results  Component Value Date   CALCIUM 9.0 06/03/2021   PHOS 4.3 07/18/2020  Bone minerals within acceptable range at this time.  We will continue to monitor  4. Diabetes mellitus type II with chronic kidney disease  insulin dependent. Home regimen includes NovoLog and Levemir. Most recent hemoglobin A1c is 10.3 on 12/24/20.  Glucose remains elevated  5.  Hypertension with chronic kidney disease.  Home regimen includes amlodipine, carvedilol, isosorbide, hydralazine and torsemide.  Currently receiving carvedilol, furosemide, and hydralazine.  Blood pressure currently 147/67.  6.  Chronic systolic heart failure.  EF from July 16, 2020 shows EF 30 to 35% with mild LVH and a grade 2 diastolic dysfunction.   LOS: 3   12/17/202211:39 AM

## 2021-06-05 LAB — RENAL FUNCTION PANEL
Albumin: 3.2 g/dL — ABNORMAL LOW (ref 3.5–5.0)
Anion gap: 9 (ref 5–15)
BUN: 53 mg/dL — ABNORMAL HIGH (ref 6–20)
CO2: 27 mmol/L (ref 22–32)
Calcium: 9.1 mg/dL (ref 8.9–10.3)
Chloride: 97 mmol/L — ABNORMAL LOW (ref 98–111)
Creatinine, Ser: 7.05 mg/dL — ABNORMAL HIGH (ref 0.61–1.24)
GFR, Estimated: 8 mL/min — ABNORMAL LOW (ref >60)
Glucose, Bld: 150 mg/dL — ABNORMAL HIGH (ref 70–99)
Phosphorus: 5.3 mg/dL — ABNORMAL HIGH (ref 2.5–4.6)
Potassium: 4.8 mmol/L (ref 3.5–5.1)
Sodium: 133 mmol/L — ABNORMAL LOW (ref 135–145)

## 2021-06-05 LAB — GLUCOSE, CAPILLARY
Glucose-Capillary: 156 mg/dL — ABNORMAL HIGH (ref 70–99)
Glucose-Capillary: 192 mg/dL — ABNORMAL HIGH (ref 70–99)
Glucose-Capillary: 265 mg/dL — ABNORMAL HIGH (ref 70–99)
Glucose-Capillary: 107 mg/dL — ABNORMAL HIGH (ref 70–99)

## 2021-06-05 MED ORDER — FUROSEMIDE 10 MG/ML IJ SOLN
40.0000 mg | INTRAMUSCULAR | Status: DC
  Administered 2021-06-07: 10:00:00 40 mg via INTRAVENOUS
  Filled 2021-06-05: qty 4

## 2021-06-05 MED ORDER — CARVEDILOL 25 MG PO TABS
25.0000 mg | ORAL_TABLET | Freq: Two times a day (BID) | ORAL | Status: DC
  Administered 2021-06-05 – 2021-06-07 (×4): 25 mg via ORAL
  Filled 2021-06-05 (×4): qty 1

## 2021-06-05 NOTE — Evaluation (Signed)
Occupational Therapy Evaluation Patient Details Name: Jimmy Olson MRN: [id number] DOB: Jun 30, 1960 Today's Date: 06/05/2021   History of Present Illness 60 y.o. male with complaints of shortness of breath, apparently he can typically self-manage with inhaler but that has run out.  Has progressively gotten worse and required O2 here in the ED.   Clinical Impression   Jimmy Olson has made considerable progress since last receiving rehab services (PT) 4 days previous. Today he is able to perform bed mobility, transfers, dressing, grooming (shaving), and toileting INDly, ambulating to bathroom without AD. He does have brief moments of sway/instability while standing/ambulating, but self-recovers quickly. Pt has a RW at home but reports that he does not use it. He states he would be more likely to use a SPC if he had one. He also reports that he has occasional bouts of SOB, which he believes he could manage better at home (and avoid trips to ER) if he had portable O2 available PRN. Pt appears to be at/near baseline performance of fxl mobility this day, and is Mod I for ADL. He is on room air this AM, with O2 sats in upper 90s. No further OT services required at present. Do recommend home O2 and SPC for pt, as well as assistance with acquiring inhaler meds if needed, given that this hospitalization was preceded by pt experiencing SOB at home and being out of meds for his inhaler.     Recommendations for follow up therapy are one component of a multi-disciplinary discharge planning process, led by the attending physician.  Recommendations may be updated based on patient status, additional functional criteria and insurance authorization.   Follow Up Recommendations  No OT follow up    Assistance Recommended at Discharge Intermittent Supervision/Assistance  Functional Status Assessment  Patient has had a recent decline in their functional status and demonstrates the ability to make significant  improvements in function in a reasonable and predictable amount of time.  Equipment Recommendations  Other (comment) (SPC, Home O2)    Recommendations for Other Services Other (comment) (assistance acquiring meds?)     Precautions / Restrictions Precautions Precautions: Fall Restrictions Weight Bearing Restrictions: No      Mobility Bed Mobility Overal bed mobility: Modified Independent             General bed mobility comments: Able to get to sitting EOB w/o assist, no need for hand rails    Transfers Overall transfer level: Needs assistance Equipment used: None Transfers: Sit to/from Stand Sit to Stand: Modified independent (Device/Increase time)                  Balance Overall balance assessment: Needs assistance Sitting-balance support: Bilateral upper extremity supported Sitting balance-Leahy Scale: Normal     Standing balance support: During functional activity;No upper extremity supported Standing balance-Leahy Scale: Good Standing balance comment: a few moments of instability in standing, walking w/o AD. Pt able to quickly self-correct.                           ADL either performed or assessed with clinical judgement   ADL Overall ADL's : At baseline                                       General ADL Comments: Pt able to perform LB dressing, toileting, grooming INDly, no AD required  Vision         Perception     Praxis      Pertinent Vitals/Pain Pain Score: 4  Pain Location: Permcath surgical site     Hand Dominance     Extremity/Trunk Assessment Upper Extremity Assessment Upper Extremity Assessment: Overall WFL for tasks assessed   Lower Extremity Assessment Lower Extremity Assessment: Overall WFL for tasks assessed       Communication Communication Communication: Prefers language other than English   Cognition Arousal/Alertness: Awake/alert Behavior During Therapy: WFL for tasks  assessed/performed Overall Cognitive Status: Within Functional Limits for tasks assessed                                       General Comments       Exercises Other Exercises Other Exercises: Bed mobility, grooming, dressing, toileting, transfers, ambulation. Educ re: role of OT, POC, DC options, safe use of DME.   Shoulder Instructions      Home Living Family/patient expects to be discharged to:: Private residence Living Arrangements: Spouse/significant other Available Help at Discharge: Family   Home Access: Stairs to enter Technical brewer of Steps: 4 Entrance Stairs-Rails: Left;Right Home Layout: One level               [place]: Conservation officer, nature (2 wheels)   Additional Comments: has RW at home but does not use. He states he would use a cane if he had one.      Prior Functioning/Environment Prior Level of Function : Needs assist             Mobility Comments: Pt reports that he is able to go out during brief errands, etc but does not do any prolonged walking/activity.  Typically uses no ADs. ADLs Comments: IND in dressing, bathing, toileting. Does not drive. Assists with cooking. Wife does shopping and most housework.        OT Problem List: Decreased strength;Impaired balance (sitting and/or standing);Decreased knowledge of use of DME or AE;Decreased activity tolerance      OT Treatment/Interventions:      OT Goals(Current goals can be found in the care plan section) Acute Rehab OT Goals Patient Stated Goal: to have O2 at home to use PRN OT Goal Formulation: With patient Time For Goal Achievement: 06/19/21 Potential to Achieve Goals: Good  OT Frequency:     Barriers to D/C:            Co-evaluation              AM-PAC OT "6 Clicks" Daily Activity     Outcome Measure Help from another person eating meals?: None Help from another person taking care of personal grooming?: None Help from another person toileting,  which includes using toliet, bedpan, or urinal?: None Help from another person bathing (including washing, rinsing, drying)?: None Help from another person to put on and taking off regular upper body clothing?: None Help from another person to put on and taking off regular lower body clothing?: None 6 Click Score: 24   End of Session    Activity Tolerance: Patient tolerated treatment well Patient left: in chair;with nursing/sitter in room;with call bell/phone within reach  OT Visit Diagnosis: Unsteadiness on feet (R26.81)                Time: 4259-5638 OT Time Calculation (min): 10 min Charges:  OT General Charges $OT Visit: 1 Visit  OT Evaluation $OT Eval Low Complexity: 1 Low OT Treatments $Self Care/Home Management : 8-22 mins Josiah Lobo, PhD, MS, OTR/L 06/05/21, 10:07 AM

## 2021-06-05 NOTE — Progress Notes (Signed)
[facility]  ROUNDING NOTE   Subjective:   Jimmy Olson is a 60 year old male with a past medical history consisting of coronary artery disease, diabetes, hypertension, chronic kidney disease stage IV.  He presents to the emergency department with complaints of shortness of breath.  He will be admitted for SOB (shortness of breath) [R06.02] Kidney damage [S37.009A] Acute decompensated heart failure ([place]) [I50.9] Hypertension, unspecified type [I10] Congestive heart failure, unspecified HF chronicity, unspecified heart failure type ([place]) [I50.9]  Update Patient laying in bed Complains of soreness at permcath site Denies shortness of breath Denies nausea and vomiting   Objective:  Vital signs in last 24 hours:  Temp:  [98 F (36.7 C)-98.4 F (36.9 C)] 98.4 F (36.9 C) (12/18 0356) Pulse Rate:  [73-79] 76 (12/18 0356) Resp:  [17-18] 17 (12/18 0356) BP: (143-174)/(71-86) 169/71 (12/18 0600) SpO2:  [98 %-100 %] 100 % (12/18 0356) Weight:  [64 kg] 64 kg (12/18 0356)  Weight change: -0.098 kg Filed Weights   06/03/21 1210 06/04/21 0500 06/05/21 0356  Weight: 62.2 kg 63.9 kg 64 kg    Intake/Output: I/O last 3 completed shifts: In: 240 [P.O.:240] Out: 750 [Urine:750]   Intake/Output this shift:  No intake/output data recorded.  Physical Exam: General: NAD, resting in bed  Head: Normocephalic, atraumatic. Moist oral mucosal membranes  Eyes: Anicteric  Lungs:  Clear to ausculatation, normal effort  Heart: Regular rate and rhythm  Abdomen:  Soft, nontender  Extremities:  no peripheral edema.  Neurologic: Nonfocal, moving all four extremities  Skin: No lesions  Access: Rt IJ Permcath    Basic Metabolic Panel: Recent Labs  Lab 05/31/21 2210 06/01/21 0026 06/01/21 0030 06/02/21 0705 06/03/21 0648  NA 136  --   --  137 138  K 5.3*  --   --  5.1 4.3  CL 106  --   --  106 104  CO2 21*  --   --  23 27  GLUCOSE 295*  --   --  194* 206*  BUN 69*  --    --  76* 50*  CREATININE 4.61*  --  4.46* 4.69* 3.82*  CALCIUM 9.0  --   --  9.1 9.0  MG  --  1.9  --   --  1.9     Liver Function Tests: No results for input(s): AST, ALT, ALKPHOS, BILITOT, PROT, ALBUMIN in the last 168 hours. No results for input(s): LIPASE, AMYLASE in the last 168 hours. No results for input(s): AMMONIA in the last 168 hours.  CBC: Recent Labs  Lab 05/31/21 2210 06/01/21 0204 06/01/21 0445 06/02/21 0705 06/03/21 0648 06/04/21 0431  WBC 8.9 8.2 7.0 5.1 4.8 5.2  NEUTROABS 4.9  --   --   --  2.8  --   HGB 9.5* 8.0* 7.5* 7.8* 8.0* 8.3*  HCT 28.6* 24.1* 22.6* 22.9* 23.5* 24.3*  MCV 91.4 90.3 89.3 87.4 88.0 87.1  PLT 231 183 181 196 180 178     Cardiac Enzymes: No results for input(s): CKTOTAL, CKMB, CKMBINDEX, TROPONINI in the last 168 hours.  BNP: Invalid input(s): POCBNP  CBG: Recent Labs  Lab 06/04/21 0818 06/04/21 1140 06/04/21 1638 06/04/21 2100 06/05/21 0805  GLUCAP 189* 250* 167* 148* 156*     Microbiology: Results for orders placed or performed during the hospital encounter of 05/31/21  Resp Panel by RT-PCR (Flu A&B, Covid) Nasopharyngeal Swab     Status: None   Collection Time: 05/31/21 10:10 PM   Specimen:  Nasopharyngeal Swab; Nasopharyngeal(NP) swabs in vial transport medium  Result Value Ref Range Status   SARS Coronavirus 2 by RT PCR NEGATIVE NEGATIVE Final    Comment: (NOTE) SARS-CoV-2 target nucleic acids are NOT DETECTED.  The SARS-CoV-2 RNA is generally detectable in upper respiratory specimens during the acute phase of infection. The lowest concentration of SARS-CoV-2 viral copies this assay can detect is 138 copies/mL. A negative result does not preclude SARS-Cov-2 infection and should not be used as the sole basis for treatment or other patient management decisions. A negative result may occur with  improper specimen collection/handling, submission of specimen other than nasopharyngeal swab, presence of viral  mutation(s) within the areas targeted by this assay, and inadequate number of viral copies(<138 copies/mL). A negative result must be combined with clinical observations, patient history, and epidemiological information. The expected result is Negative.  Fact Sheet for Patients:  EntrepreneurPulse.com.au  Fact Sheet for Healthcare Providers:  IncredibleEmployment.be  This test is no t yet approved or cleared by the [place] FDA and  has been authorized for detection and/or diagnosis of SARS-CoV-2 by FDA under an Emergency Use Authorization (EUA). This EUA will remain  in effect (meaning this test can be used) for the duration of the COVID-19 declaration under Section 564(b)(1) of the Act, 21 U.S.C.section 360bbb-3(b)(1), unless the authorization is terminated  or revoked sooner.       Influenza A by PCR NEGATIVE NEGATIVE Final   Influenza B by PCR NEGATIVE NEGATIVE Final    Comment: (NOTE) The Xpert Xpress SARS-CoV-2/FLU/RSV plus assay is intended as an aid in the diagnosis of influenza from Nasopharyngeal swab specimens and should not be used as a sole basis for treatment. Nasal washings and aspirates are unacceptable for Xpert Xpress SARS-CoV-2/FLU/RSV testing.  Fact Sheet for Patients: EntrepreneurPulse.com.au  Fact Sheet for Healthcare Providers: IncredibleEmployment.be  This test is not yet approved or cleared by the [place] FDA and has been authorized for detection and/or diagnosis of SARS-CoV-2 by FDA under an Emergency Use Authorization (EUA). This EUA will remain in effect (meaning this test can be used) for the duration of the COVID-19 declaration under Section 564(b)(1) of the Act, 21 U.S.C. section 360bbb-3(b)(1), unless the authorization is terminated or revoked.  Performed at [facility], [address] [place], [place] [address]     Coagulation Studies: No  results for input(s): LABPROT, INR in the last 72 hours.   Urinalysis: No results for input(s): COLORURINE, LABSPEC, PHURINE, GLUCOSEU, HGBUR, BILIRUBINUR, KETONESUR, PROTEINUR, UROBILINOGEN, NITRITE, LEUKOCYTESUR in the last 72 hours.  Invalid input(s): APPERANCEUR    Imaging: CARDIAC CATHETERIZATION  Result Date: 06/03/2021   Prox LAD lesion is 40% stenosed.   Mid LAD lesion is 60% stenosed.   Prox RCA lesion is 20% stenosed.   Mid RCA lesion is 30% stenosed.   1st Diag lesion is 70% stenosed.   Dist RCA lesion is 30% stenosed.   RPDA lesion is 40% stenosed.   There is mild left ventricular systolic dysfunction.   LV end diastolic pressure is mildly elevated.   The left ventricular ejection fraction is 45-50% by visual estimate. 1.  Heavily calcified coronary arteries with moderate two-vessel coronary artery disease involving the LAD and right coronary artery.  No culprit is identified for non-STEMI. 2.  Mildly reduced LV systolic function and mildly elevated left ventricular end-diastolic pressure. Recommendations: The patient disease appears to be chronic with no acute culprit identified.  Suspect that elevated troponin is due to supply demand  ischemia.  FFR guided PCI of the LAD can be considered in the future if the patient has refractory anginal symptoms.  For now, recommend aggressive medical therapy.     Medications:    sodium chloride      aspirin EC  81 mg Oral Daily   atorvastatin  80 mg Oral Daily   carvedilol  12.5 mg Oral BID   Chlorhexidine Gluconate Cloth  6 each Topical Q0600   docusate sodium  100 mg Oral BID   epoetin (EPOGEN/PROCRIT) injection  4,000 Units Intravenous Q T,Th,Sa-HD   furosemide  40 mg Intravenous BID   heparin injection (subcutaneous)  5,000 Units Subcutaneous Q8H   hydrALAZINE  100 mg Oral Q8H   insulin aspart  0-15 Units Subcutaneous TID WC   isosorbide mononitrate  30 mg Oral Daily   lidocaine  1 patch Transdermal Q24H   sodium chloride flush  3  mL Intravenous Q12H   sodium chloride flush  3 mL Intravenous Q12H   sodium chloride, acetaminophen **OR** acetaminophen, albuterol, hydrALAZINE, HYDROmorphone (DILAUDID) injection, LORazepam, morphine injection, ondansetron **OR** ondansetron (ZOFRAN) IV, ondansetron (ZOFRAN) IV, oxyCODONE, polyethylene glycol, sodium chloride flush  Assessment/ Plan:  Mr. Jimmy Olson is a 60 y.o.  male ith a past medical history consisting of coronary artery disease, diabetes, hypertension, chronic kidney disease stage IV.  He presents to the emergency department with complaints of shortness of breath.  He will be admitted for SOB (shortness of breath) [R06.02] Kidney damage [S37.009A] Acute decompensated heart failure (HCC) [I50.9] Hypertension, unspecified type [I10] Congestive heart failure, unspecified HF chronicity, unspecified heart failure type ([place]) [I50.9]   End-stage renal disease requiring hemodialysis.  Based on chart review and recent office note.  We feel patient kidney disease has progressed to stage V requiring initiation of renal replacement therapy. Appreciate vascular placing Permcath 06/02/21 and dialysis initiated on that day.   -Plan to perform third dialysis treatment tomorrow, seated in chair.   -Dialysis coordinator aware of patient and outpatient clinic search in progress.   2. Anemia of chronic kidney disease Lab Results  Component Value Date   HGB 8.3 (L) 06/04/2021   Continue low-dose EPO ordered with treatments  3. Secondary Hyperparathyroidism:  Lab Results  Component Value Date   CALCIUM 9.0 06/03/2021   PHOS 4.3 07/18/2020   We will continue to monitor  4. Diabetes mellitus type II with chronic kidney disease  insulin dependent. Home regimen includes NovoLog and Levemir. Most recent hemoglobin A1c is 10.3 on 12/24/20. Glucose stable   5.  Hypertension with chronic kidney disease.  Home regimen includes amlodipine, carvedilol, isosorbide, hydralazine and  torsemide.  Currently receiving carvedilol, furosemide, and hydralazine.  BP 169/71  6.  Chronic systolic heart failure.  EF from July 16, 2020 shows EF 30 to 35% with mild LVH and a grade 2 diastolic dysfunction. Will re-dose Furosemide to 40mg  on nondialysis days only.   LOS: 4   12/18/202210:49 AM

## 2021-06-05 NOTE — Progress Notes (Signed)
PROGRESS NOTE    Jimmy Olson  LEX:[id number] DOB: Dec 19, 1960 DOA: 05/31/2021 PCP: [facility] [place]    Brief Narrative:   60 y.o. male seen in ed with complaints of shortness of breath that has been going on since earlier today.  Has progressively gotten worse throughout the day.  EMS was called and while attempting to provide supplemental oxygen patient did not tolerate the facemask at that point reported shortness of breath.  Patient denies any other complaints of headache nausea diarrhea fevers chills abdominal pain chest pain palpitations.  Seen in consultation by cardiology and nephrology.  Per conversation with nephrology patient is known to their practice and kidney function has been deteriorating.  Decision was made after speaking with patient and family to initiate hemodialysis.  Vascular surgery involved in care.  Plan for catheter placement and initiation of HD 12/15.  Catheter successfully placed.  Patient initiated on hemodialysis.  Tolerating okay.  Has some mild pain at the dialysis catheter insertion site.  Blood pressures been difficult to control.  Remains suboptimal.   Assessment & Plan:   Principal Problem:   SOB (shortness of breath) Active Problems:   AKI (acute kidney injury) ([place])   Acute on chronic HFrEF (heart failure with reduced ejection fraction) (HCC)   Demand ischemia (HCC)   Anemia   Acute decompensated heart failure (H[facility])   Acute on chronic systolic and diastolic congestive heart failure Acute hypoxic respiratory failure secondary to above Patient with known global heart failure Required BiPAP on admission Able to wean off of BiPAP 12/14 Respiratory status is improved  Weaned to room air as of 12/17 Plan: Continue IV furosemide per nephrology Dialysis per nephrology recommendations Nasal cannula as needed continue IV furosemide  Hypertensive urgency/emergency IV nitro drip discontinued before patient went to  dialysis Blood pressure control remains suboptimal Plan: Increase Coreg 25 mg twice daily Imdur 30 mg daily Hydralazine 100 3 times daily Furosemide 40 twice daily Monitor and escalate regimen as necessary Dialysis should help with blood pressure control  Right axillary DVT Noted on ultrasound vein mapping Notified vascular surgery nephrology following May require therapeutic anticoagulation Heparin GTT stopped Will discuss with nephrology and vascular surgery Currently continue subcu heparin  NSTEMI Uptrending troponins in the setting of decompensated heart failure Heparin drip stopped by cardiology  Acute kidney injury on chronic kidney disease stage IV Patient known to the nephrology service I have explained the patient's kidney function has deteriorated to the point he will require hemodialysis Patient is agreeable Plan: PermCath in place right chest.   Continue hemodialysis per nephrology recommendations  Anemia of chronic kidney disease Hemoglobin stable Currently no indication for transfusion   DVT prophylaxis: Heparin GTT Code Status: Full Family Communication: Attempted to call spouse [name] [phone] on 12/14.  No answer.  Voicemail not left.  Discussed with spouse at bedside 12/15 Disposition Plan: Status is: Inpatient  Remains inpatient appropriate because: Acute decompensated heart failure.  New dialysis start.  Disposition plan pending.  Level of care: Progressive  Consultants:  Cardiology Nephrology Vascular surgery  Procedures:  None  Antimicrobials: None   Subjective: Seen and examined.  Complains of pain near catheter insertion site  Objective: Vitals:   06/04/21 2033 06/04/21 2341 06/05/21 0356 06/05/21 0600  BP: (!) 157/84 (!) 143/76 (!) 174/86 (!) 169/71  Pulse: 73 79 76   Resp: 18 17 17    Temp: 98.2 F (36.8 C) 98 F (36.7 C) 98.4 F (36.9 C)   TempSrc: Oral  Oral Oral   SpO2: 100% 98% 100%   Weight:   64 kg   Height:         Intake/Output Summary (Last 24 hours) at 06/05/2021 1151 Last data filed at 06/05/2021 0900 Gross per 24 hour  Intake 480 ml  Output 250 ml  Net 230 ml   Filed Weights   06/03/21 1210 06/04/21 0500 06/05/21 0356  Weight: 62.2 kg 63.9 kg 64 kg    Examination:  General exam: No acute distress.  Fatigued Respiratory system: Lungs clear.  Normal work of breathing.  Room air Cardiovascular system: S1-S2, RRR, no murmurs, no pedal edema Gastrointestinal system: Soft, NT/ND, normal bowel sounds Central nervous system: Alert and oriented. No focal neurological deficits. Extremities: Symmetric 5 x 5 power. Skin: No rashes, lesions or ulcers Psychiatry: Judgement and insight appear normal. Mood & affect appropriate.     Data Reviewed: I have personally reviewed following labs and imaging studies  CBC: Recent Labs  Lab 05/31/21 2210 06/01/21 0204 06/01/21 0445 06/02/21 0705 06/03/21 0648 06/04/21 0431  WBC 8.9 8.2 7.0 5.1 4.8 5.2  NEUTROABS 4.9  --   --   --  2.8  --   HGB 9.5* 8.0* 7.5* 7.8* 8.0* 8.3*  HCT 28.6* 24.1* 22.6* 22.9* 23.5* 24.3*  MCV 91.4 90.3 89.3 87.4 88.0 87.1  PLT 231 183 181 196 180 086   Basic Metabolic Panel: Recent Labs  Lab 05/31/21 2210 06/01/21 0026 06/01/21 0030 06/02/21 0705 06/03/21 0648 06/05/21 0951  NA 136  --   --  137 138 133*  K 5.3*  --   --  5.1 4.3 4.8  CL 106  --   --  106 104 97*  CO2 21*  --   --  23 27 27   GLUCOSE 295*  --   --  194* 206* 150*  BUN 69*  --   --  76* 50* 53*  CREATININE 4.61*  --  4.46* 4.69* 3.82* 7.05*  CALCIUM 9.0  --   --  9.1 9.0 9.1  MG  --  1.9  --   --  1.9  --   PHOS  --   --   --   --   --  5.3*   GFR: Estimated Creatinine Clearance: 8.8 mL/min (A) (by C-G formula based on SCr of 7.05 mg/dL (H)). Liver Function Tests: Recent Labs  Lab 06/05/21 0951  ALBUMIN 3.2*   No results for input(s): LIPASE, AMYLASE in the last 168 hours. No results for input(s): AMMONIA in the last 168  hours. Coagulation Profile: Recent Labs  Lab 06/01/21 0204  INR 1.1   Cardiac Enzymes: No results for input(s): CKTOTAL, CKMB, CKMBINDEX, TROPONINI in the last 168 hours. BNP (last 3 results) No results for input(s): PROBNP in the last 8760 hours. HbA1C: No results for input(s): HGBA1C in the last 72 hours.  CBG: Recent Labs  Lab 06/04/21 1140 06/04/21 1638 06/04/21 2100 06/05/21 0805 06/05/21 1142  GLUCAP 250* 167* 148* 156* 192*   Lipid Profile: No results for input(s): CHOL, HDL, LDLCALC, TRIG, CHOLHDL, LDLDIRECT in the last 72 hours. Thyroid Function Tests: No results for input(s): TSH, T4TOTAL, FREET4, T3FREE, THYROIDAB in the last 72 hours. Anemia Panel: No results for input(s): VITAMINB12, FOLATE, FERRITIN, TIBC, IRON, RETICCTPCT in the last 72 hours. Sepsis Labs: No results for input(s): PROCALCITON, LATICACIDVEN in the last 168 hours.  Recent Results (from the past 240 hour(s))  Resp Panel by RT-PCR (Flu A&B, Covid) Nasopharyngeal  Swab     Status: None   Collection Time: 05/31/21 10:10 PM   Specimen: Nasopharyngeal Swab; Nasopharyngeal(NP) swabs in vial transport medium  Result Value Ref Range Status   SARS Coronavirus 2 by RT PCR NEGATIVE NEGATIVE Final    Comment: (NOTE) SARS-CoV-2 target nucleic acids are NOT DETECTED.  The SARS-CoV-2 RNA is generally detectable in upper respiratory specimens during the acute phase of infection. The lowest concentration of SARS-CoV-2 viral copies this assay can detect is 138 copies/mL. A negative result does not preclude SARS-Cov-2 infection and should not be used as the sole basis for treatment or other patient management decisions. A negative result may occur with  improper specimen collection/handling, submission of specimen other than nasopharyngeal swab, presence of viral mutation(s) within the areas targeted by this assay, and inadequate number of viral copies(<138 copies/mL). A negative result must be combined  with clinical observations, patient history, and epidemiological information. The expected result is Negative.  Fact Sheet for Patients:  EntrepreneurPulse.com.au  Fact Sheet for Healthcare Providers:  IncredibleEmployment.be  This test is no t yet approved or cleared by the [place] FDA and  has been authorized for detection and/or diagnosis of SARS-CoV-2 by FDA under an Emergency Use Authorization (EUA). This EUA will remain  in effect (meaning this test can be used) for the duration of the COVID-19 declaration under Section 564(b)(1) of the Act, 21 U.S.C.section 360bbb-3(b)(1), unless the authorization is terminated  or revoked sooner.       Influenza A by PCR NEGATIVE NEGATIVE Final   Influenza B by PCR NEGATIVE NEGATIVE Final    Comment: (NOTE) The Xpert Xpress SARS-CoV-2/FLU/RSV plus assay is intended as an aid in the diagnosis of influenza from Nasopharyngeal swab specimens and should not be used as a sole basis for treatment. Nasal washings and aspirates are unacceptable for Xpert Xpress SARS-CoV-2/FLU/RSV testing.  Fact Sheet for Patients: EntrepreneurPulse.com.au  Fact Sheet for Healthcare Providers: IncredibleEmployment.be  This test is not yet approved or cleared by the [place] FDA and has been authorized for detection and/or diagnosis of SARS-CoV-2 by FDA under an Emergency Use Authorization (EUA). This EUA will remain in effect (meaning this test can be used) for the duration of the COVID-19 declaration under Section 564(b)(1) of the Act, 21 U.S.C. section 360bbb-3(b)(1), unless the authorization is terminated or revoked.  Performed at [facility], [address], [place], [place] [address]          Radiology Studies: CARDIAC CATHETERIZATION  Result Date: 06/03/2021   Prox LAD lesion is 40% stenosed.   Mid LAD lesion is 60% stenosed.   Prox RCA lesion is  20% stenosed.   Mid RCA lesion is 30% stenosed.   1st Diag lesion is 70% stenosed.   Dist RCA lesion is 30% stenosed.   RPDA lesion is 40% stenosed.   There is mild left ventricular systolic dysfunction.   LV end diastolic pressure is mildly elevated.   The left ventricular ejection fraction is 45-50% by visual estimate. 1.  Heavily calcified coronary arteries with moderate two-vessel coronary artery disease involving the LAD and right coronary artery.  No culprit is identified for non-STEMI. 2.  Mildly reduced LV systolic function and mildly elevated left ventricular end-diastolic pressure. Recommendations: The patient disease appears to be chronic with no acute culprit identified.  Suspect that elevated troponin is due to supply demand ischemia.  FFR guided PCI of the LAD can be considered in the future if the patient has refractory anginal  symptoms.  For now, recommend aggressive medical therapy.        Scheduled Meds:  aspirin EC  81 mg Oral Daily   atorvastatin  80 mg Oral Daily   carvedilol  12.5 mg Oral BID   Chlorhexidine Gluconate Cloth  6 each Topical Q0600   docusate sodium  100 mg Oral BID   epoetin (EPOGEN/PROCRIT) injection  4,000 Units Intravenous Q T,Th,Sa-HD   [START ON 06/07/2021] furosemide  40 mg Intravenous Q T,Th,Sat-1800   heparin injection (subcutaneous)  5,000 Units Subcutaneous Q8H   hydrALAZINE  100 mg Oral Q8H   insulin aspart  0-15 Units Subcutaneous TID WC   isosorbide mononitrate  30 mg Oral Daily   lidocaine  1 patch Transdermal Q24H   sodium chloride flush  3 mL Intravenous Q12H   sodium chloride flush  3 mL Intravenous Q12H   Continuous Infusions:  sodium chloride       LOS: 4 days    Time spent: 25 minutes    Sidney Ace, MD [facility]   If 7PM-7AM, please contact night-coverage  06/05/2021, 11:51 AM

## 2021-06-06 ENCOUNTER — Encounter: Payer: Self-pay | Admitting: Cardiovascular Disease

## 2021-06-06 LAB — GLUCOSE, CAPILLARY
Glucose-Capillary: 147 mg/dL — ABNORMAL HIGH (ref 70–99)
Glucose-Capillary: 193 mg/dL — ABNORMAL HIGH (ref 70–99)
Glucose-Capillary: 138 mg/dL — ABNORMAL HIGH (ref 70–99)
Glucose-Capillary: 247 mg/dL — ABNORMAL HIGH (ref 70–99)
Glucose-Capillary: 200 mg/dL — ABNORMAL HIGH (ref 70–99)

## 2021-06-06 MED ORDER — EPOETIN ALFA 10000 UNIT/ML IJ SOLN: 4000.0000 [IU] | INTRAMUSCULAR | Status: DC

## 2021-06-06 MED ORDER — EPOETIN ALFA 4000 UNIT/ML IJ SOLN
INTRAMUSCULAR | Status: AC
  Administered 2021-06-06: 4000 [IU] via INTRAVENOUS_CENTRAL
  Filled 2021-06-06: qty 1

## 2021-06-06 MED ORDER — APIXABAN 5 MG PO TABS
10.0000 mg | ORAL_TABLET | Freq: Two times a day (BID) | ORAL | Status: DC
  Administered 2021-06-06 – 2021-06-07 (×2): 10 mg via ORAL
  Filled 2021-06-06 (×2): qty 2

## 2021-06-06 MED ORDER — APIXABAN 5 MG PO TABS: 5.0000 mg | ORAL_TABLET | Freq: Two times a day (BID) | ORAL | Status: DC

## 2021-06-06 MED ORDER — HEPARIN SODIUM (PORCINE) 1000 UNIT/ML IJ SOLN
INTRAMUSCULAR | Status: AC
  Administered 2021-06-06: 3800 [IU]
  Filled 2021-06-06: qty 10

## 2021-06-06 NOTE — Progress Notes (Signed)
Patient has been accepted at [facility] MWF 12:00pm. Start date Wednesday 12/21 at 11:00am.

## 2021-06-06 NOTE — Progress Notes (Signed)
Pt completed  HD tx. Pt was asymptomatic, UF removed total net was 335ml.

## 2021-06-06 NOTE — Progress Notes (Signed)
BP was 98/67 pt asymptomatic, bp trend going down, uf off, flushed 100cc nss, UF goal decreased to .5L uf net from 1L uf net. then rebp again - 121/67

## 2021-06-06 NOTE — Progress Notes (Signed)
[facility]  ROUNDING NOTE   Subjective:   Jimmy Olson is a 60 year old male with a past medical history consisting of coronary artery disease, diabetes, hypertension, chronic kidney disease stage IV.  He presents to the emergency department with complaints of shortness of breath.  He will be admitted for SOB (shortness of breath) [R06.02] Kidney damage [S37.009A] Acute decompensated heart failure ([place]) [I50.9] Hypertension, unspecified type [I10] Congestive heart failure, unspecified HF chronicity, unspecified heart failure type ([place]) [I50.9]  Update Patient seen and evaluated during dialysis   HEMODIALYSIS FLOWSHEET:  Blood Flow Rate (mL/min): 300 mL/min Arterial Pressure (mmHg): -120 mmHg Venous Pressure (mmHg): 80 mmHg Transmembrane Pressure (mmHg): 70 mmHg Ultrafiltration Rate (mL/min): 310 mL/min Dialysate Flow Rate (mL/min): 500 ml/min Conductivity: Machine : 13.5 Conductivity: Machine : 13.5 Dialysis Fluid Bolus: Normal Saline Bolus Amount (mL): 100 mL  No complaints at this time   Objective:  Vital signs in last 24 hours:  Temp:  [98 F (36.7 C)-99.3 F (37.4 C)] 98 F (36.7 C) (12/19 1303) Pulse Rate:  [71-78] 71 (12/19 1303) Resp:  [10-20] 14 (12/19 1303) BP: (106-177)/(67-92) 155/80 (12/19 1303) SpO2:  [97 %-100 %] 100 % (12/19 1303) Weight:  [65.1 kg] 65.1 kg (12/19 0936)  Weight change:  Filed Weights   06/04/21 0500 06/05/21 0356 06/06/21 0936  Weight: 63.9 kg 64 kg 65.1 kg    Intake/Output: I/O last 3 completed shifts: In: 240 [P.O.:240] Out: 250 [Urine:250]   Intake/Output this shift:  Total I/O In: -  Out: 376 [Other:376]  Physical Exam: General: NAD, resting in bed  Head: Normocephalic, atraumatic. Moist oral mucosal membranes  Eyes: Anicteric  Lungs:  Clear to ausculatation, normal effort  Heart: Regular rate and rhythm  Abdomen:  Soft, nontender  Extremities:  no peripheral edema.  Neurologic: Nonfocal, moving all  four extremities  Skin: No lesions  Access: Rt IJ Permcath    Basic Metabolic Panel: Recent Labs  Lab 05/31/21 2210 06/01/21 0026 06/01/21 0030 06/02/21 0705 06/03/21 0648 06/05/21 0951  NA 136  --   --  137 138 133*  K 5.3*  --   --  5.1 4.3 4.8  CL 106  --   --  106 104 97*  CO2 21*  --   --  23 27 27   GLUCOSE 295*  --   --  194* 206* 150*  BUN 69*  --   --  76* 50* 53*  CREATININE 4.61*  --  4.46* 4.69* 3.82* 7.05*  CALCIUM 9.0  --   --  9.1 9.0 9.1  MG  --  1.9  --   --  1.9  --   PHOS  --   --   --   --   --  5.3*     Liver Function Tests: Recent Labs  Lab 06/05/21 0951  ALBUMIN 3.2*   No results for input(s): LIPASE, AMYLASE in the last 168 hours. No results for input(s): AMMONIA in the last 168 hours.  CBC: Recent Labs  Lab 05/31/21 2210 06/01/21 0204 06/01/21 0445 06/02/21 0705 06/03/21 0648 06/04/21 0431  WBC 8.9 8.2 7.0 5.1 4.8 5.2  NEUTROABS 4.9  --   --   --  2.8  --   HGB 9.5* 8.0* 7.5* 7.8* 8.0* 8.3*  HCT 28.6* 24.1* 22.6* 22.9* 23.5* 24.3*  MCV 91.4 90.3 89.3 87.4 88.0 87.1  PLT 231 183 181 196 180 178     Cardiac Enzymes: No results for input(s): CKTOTAL,  CKMB, CKMBINDEX, TROPONINI in the last 168 hours.  BNP: Invalid input(s): POCBNP  CBG: Recent Labs  Lab 06/05/21 0805 06/05/21 1142 06/05/21 1635 06/05/21 1950 06/06/21 0900  GLUCAP 156* 192* 265* 107* 193*     Microbiology: Results for orders placed or performed during the hospital encounter of 05/31/21  Resp Panel by RT-PCR (Flu A&B, Covid) Nasopharyngeal Swab     Status: None   Collection Time: 05/31/21 10:10 PM   Specimen: Nasopharyngeal Swab; Nasopharyngeal(NP) swabs in vial transport medium  Result Value Ref Range Status   SARS Coronavirus 2 by RT PCR NEGATIVE NEGATIVE Final    Comment: (NOTE) SARS-CoV-2 target nucleic acids are NOT DETECTED.  The SARS-CoV-2 RNA is generally detectable in upper respiratory specimens during the acute phase of infection. The  lowest concentration of SARS-CoV-2 viral copies this assay can detect is 138 copies/mL. A negative result does not preclude SARS-Cov-2 infection and should not be used as the sole basis for treatment or other patient management decisions. A negative result may occur with  improper specimen collection/handling, submission of specimen other than nasopharyngeal swab, presence of viral mutation(s) within the areas targeted by this assay, and inadequate number of viral copies(<138 copies/mL). A negative result must be combined with clinical observations, patient history, and epidemiological information. The expected result is Negative.  Fact Sheet for Patients:  EntrepreneurPulse.com.au  Fact Sheet for Healthcare Providers:  IncredibleEmployment.be  This test is no t yet approved or cleared by the [place] FDA and  has been authorized for detection and/or diagnosis of SARS-CoV-2 by FDA under an Emergency Use Authorization (EUA). This EUA will remain  in effect (meaning this test can be used) for the duration of the COVID-19 declaration under Section 564(b)(1) of the Act, 21 U.S.C.section 360bbb-3(b)(1), unless the authorization is terminated  or revoked sooner.       Influenza A by PCR NEGATIVE NEGATIVE Final   Influenza B by PCR NEGATIVE NEGATIVE Final    Comment: (NOTE) The Xpert Xpress SARS-CoV-2/FLU/RSV plus assay is intended as an aid in the diagnosis of influenza from Nasopharyngeal swab specimens and should not be used as a sole basis for treatment. Nasal washings and aspirates are unacceptable for Xpert Xpress SARS-CoV-2/FLU/RSV testing.  Fact Sheet for Patients: EntrepreneurPulse.com.au  Fact Sheet for Healthcare Providers: IncredibleEmployment.be  This test is not yet approved or cleared by the [place] FDA and has been authorized for detection and/or diagnosis of SARS-CoV-2 by FDA under  an Emergency Use Authorization (EUA). This EUA will remain in effect (meaning this test can be used) for the duration of the COVID-19 declaration under Section 564(b)(1) of the Act, 21 U.S.C. section 360bbb-3(b)(1), unless the authorization is terminated or revoked.  Performed at [facility], [address] [place], [place] [address]     Coagulation Studies: No results for input(s): LABPROT, INR in the last 72 hours.   Urinalysis: No results for input(s): COLORURINE, LABSPEC, PHURINE, GLUCOSEU, HGBUR, BILIRUBINUR, KETONESUR, PROTEINUR, UROBILINOGEN, NITRITE, LEUKOCYTESUR in the last 72 hours.  Invalid input(s): APPERANCEUR    Imaging: No results found.   Medications:    sodium chloride      aspirin EC  81 mg Oral Daily   atorvastatin  80 mg Oral Daily   carvedilol  25 mg Oral BID   Chlorhexidine Gluconate Cloth  6 each Topical Q0600   docusate sodium  100 mg Oral BID   epoetin (EPOGEN/PROCRIT) injection  4,000 Units Intravenous Q M,W,F-HD   [START ON 06/07/2021] furosemide  40 mg Intravenous Q T,Th,Sat-1800   heparin injection (subcutaneous)  5,000 Units Subcutaneous Q8H   hydrALAZINE  100 mg Oral Q8H   insulin aspart  0-15 Units Subcutaneous TID WC   isosorbide mononitrate  30 mg Oral Daily   lidocaine  1 patch Transdermal Q24H   sodium chloride flush  3 mL Intravenous Q12H   sodium chloride flush  3 mL Intravenous Q12H   sodium chloride, acetaminophen **OR** acetaminophen, albuterol, hydrALAZINE, HYDROmorphone (DILAUDID) injection, LORazepam, morphine injection, ondansetron **OR** ondansetron (ZOFRAN) IV, oxyCODONE, polyethylene glycol, sodium chloride flush  Assessment/ Plan:  Mr. TUCKER MINTER is a 60 y.o.  male ith a past medical history consisting of coronary artery disease, diabetes, hypertension, chronic kidney disease stage IV.  He presents to the emergency department with complaints of shortness of breath.  He will be admitted for SOB (shortness of  breath) [R06.02] Kidney damage [S37.009A] Acute decompensated heart failure (HCC) [I50.9] Hypertension, unspecified type [I10] Congestive heart failure, unspecified HF chronicity, unspecified heart failure type ([place]) [I50.9]   End-stage renal disease requiring hemodialysis.  Based on chart review and recent office note.  We feel patient kidney disease has progressed to stage V requiring initiation of renal replacement therapy. Appreciate vascular placing Permcath 06/02/21 and dialysis initiated on that day.   -Receiving third dialysis treatment, UF goal of 376 mL achieved.  -Next dialysis treatment scheduled for Wednesday.  -Appreciate dialysis coordinator confirming outpatient clinic and [facility], on MWF schedule.  Patient can receive first treatment on Wednesday.  2. Anemia of chronic kidney disease Lab Results  Component Value Date   HGB 8.3 (L) 06/04/2021   Hemoglobin remains below target. Continue low-dose EPO ordered with treatments  3. Secondary Hyperparathyroidism:  Lab Results  Component Value Date   CALCIUM 9.1 06/05/2021   PHOS 5.3 (H) 06/05/2021  Calcium at goal.  Phosphorus remains within acceptable limits.  4. Diabetes mellitus type II with chronic kidney disease  insulin dependent. Home regimen includes NovoLog and Levemir. Most recent hemoglobin A1c is 10.3 on 12/24/20.  Primary team managing SSI.   5.  Hypertension with chronic kidney disease.  Home regimen includes amlodipine, carvedilol, isosorbide, hydralazine and torsemide.  Currently receiving carvedilol, furosemide, and hydralazine.  BP currently 155/80 during dialysis.  6.  Chronic systolic heart failure.  EF from July 16, 2020 shows EF 30 to 35% with mild LVH and a grade 2 diastolic dysfunction.  Furosemide redosed nondialysis days only.   LOS: 5   12/19/20221:28 PM

## 2021-06-06 NOTE — TOC Progression Note (Signed)
Transition of Care [facility]) - Progression Note    Patient Details  Name: Jimmy Olson MRN: [id number] Date of Birth: [birth year]  Transition of Care [facility]) CM/SW [place], [employer] Phone Number: 06/06/2021, 9:59 AM  Clinical Narrative:     TOC continues to follow for discharge to [place] agency on rotation to send referral.   Per initial assessment note, patient is from home where he lives with his wife and adult son, son is disabled.  Patient is able to drive so is his wife so she will be able to provide transportation for him and help him at home as he is not safe to be alone.    His wife works nights and sleeps during the day but she is there at home if he needs her.  Patient has a walker, he has had home health PT in the past and agrees with that again.  Patient has no insurance so it will be charity HH.     Patient is current with [facility] for PCP services and gets his prescriptions from there.       Expected Discharge Plan: [place] Barriers to Discharge: Continued Medical Work up  Expected Discharge Plan and Services Expected Discharge Plan: [place]   Discharge Planning Services: CM Consult Post Acute Care Choice: [place] arrangements for the past 2 months: Single Family Home Expected Discharge Date: 06/02/21               DME Arranged: N/A DME Agency: NA       HH Arranged: PT           Social Determinants of Health (SDOH) Interventions    Readmission Risk Interventions Readmission Risk Prevention Plan 06/01/2021  Transportation Screening Complete  PCP or Specialist Appt within 3-5 Days Complete  HRI or [place] Complete  Social Work Consult for [place] Planning/Counseling Complete  Palliative Care Screening Not Applicable  Medication Review Press photographer) Referral to Pharmacy  Some recent data might be hidden

## 2021-06-06 NOTE — Final Consult Note (Signed)
ANTICOAGULATION CONSULT NOTE - Consult  Pharmacy Consult for Eliquis Indication: UE DVT  No Known Allergies  Patient Measurements: Height: 5\' 2"  (157.5 cm) Weight: 65.1 kg (143 lb 8.3 oz) IBW/kg (Calculated) : 54.6  Vital Signs: Temp: 98.1 F (36.7 C) (12/19 1340) Temp Source: Oral (12/19 1340) BP: 154/80 (12/19 1340) Pulse Rate: 74 (12/19 1340)  Labs: Recent Labs    06/04/21 0431 06/05/21 0951  HGB 8.3*  --   HCT 24.3*  --   PLT 178  --   CREATININE  --  7.05*    Estimated Creatinine Clearance: 8.8 mL/min (A) (by C-G formula based on SCr of 7.05 mg/dL (H)).   Medications:  Hep gtt x48hrs >> HSQ q8h for ppx dosing >> starting Eliquis DVT dosing 12/19  Assessment: 60yo male h/o CKD, DM, HFmrEF, HTN, CAD admitted for treatment of NSTEMI & diagnosis of LUE DVT c/b AKI on CKD4 progressed to ESRD-HD. Pharmacy consulted to initiation of Eliquis  Goal of Therapy:  Monitor platelets by anticoagulation protocol: Yes   Plan:  Stop HSQ q8h for ppx and since interruption of therapeutic heparin gtt with prophylaxis doses, will begin full course of Eliquis  starting with 10mg  BID x7 days (first dose now); followed by 5mg  BID thereafter.  Lorna Dibble, PharmD, [facility] Clinical Pharmacist 06/06/2021,3:59 PM

## 2021-06-06 NOTE — Progress Notes (Signed)
11:25- BP was 133/67 UF turned on

## 2021-06-06 NOTE — Progress Notes (Signed)
PROGRESS NOTE    Jimmy Olson  CBJ:[id number] DOB: 1961/01/31 DOA: 05/31/2021 PCP: [facility] [place]    Brief Narrative:   60 y.o. male seen in ed with complaints of shortness of breath that has been going on since earlier today.  Has progressively gotten worse throughout the day.  EMS was called and while attempting to provide supplemental oxygen patient did not tolerate the facemask at that point reported shortness of breath.  Patient denies any other complaints of headache nausea diarrhea fevers chills abdominal pain chest pain palpitations.  Seen in consultation by cardiology and nephrology.  Per conversation with nephrology patient is known to their practice and kidney function has been deteriorating.  Decision was made after speaking with patient and family to initiate hemodialysis.  Vascular surgery involved in care.  Plan for catheter placement and initiation of HD 12/15.  Catheter successfully placed.  Patient initiated on hemodialysis.  Tolerating okay.  Has some mild pain at the dialysis catheter insertion site.  Blood pressures been difficult to control.  Remains suboptimal.   Assessment & Plan:   Principal Problem:   SOB (shortness of breath) Active Problems:   AKI (acute kidney injury) ([place])   Acute on chronic HFrEF (heart failure with reduced ejection fraction) (HCC)   Demand ischemia (HCC)   Anemia   Acute decompensated heart failure (HCC)   Acute on chronic systolic and diastolic congestive heart failure Acute hypoxic respiratory failure secondary to above Patient with known global heart failure Required BiPAP on admission Able to wean off of BiPAP 12/14 Respiratory status is improved  Weaned to room air as of 12/17 Plan: Continue IV furosemide per nephrology Dialysis per nephrology recommendations Nasal cannula if needed  Hypertensive urgency/emergency IV nitro drip discontinued before patient went to dialysis Blood pressure control  remains suboptimal Plan: Continue Coreg 25 mg twice daily Imdur 30 mg daily Hydralazine 100 3 times daily Furosemide 40 twice daily Monitor and escalate regimen as necessary Dialysis should help with blood pressure control  Right axillary DVT Noted on ultrasound vein mapping Notified vascular surgery nephrology following May require therapeutic anticoagulation Heparin GTT stopped Plan: Continue subcutaneous heparin for now Will discuss risk/benefit of therapeutic anticoagulation  NSTEMI Uptrending troponins in the setting of decompensated heart failure Heparin drip stopped by cardiology  Acute kidney injury on chronic kidney disease stage IV Patient known to the nephrology service I have explained the patient's kidney function has deteriorated to the point he will require hemodialysis Patient is agreeable Plan: PermCath in place right chest.   Continue hemodialysis per nephrology recommendations  Anemia of chronic kidney disease Hemoglobin stable Currently no indication for transfusion   DVT prophylaxis: Heparin GTT Code Status: Full Family Communication: Attempted to call spouse Jimmy Olson [phone] on 12/14.  No answer.  Voicemail not left.  Discussed with spouse at bedside 12/15 Disposition Plan: Status is: Inpatient  Remains inpatient appropriate because: Acute decompensated heart failure.  New dialysis start.  Disposition plan pending.  Level of care: Progressive  Consultants:  Cardiology Nephrology Vascular surgery  Procedures:  None  Antimicrobials: None   Subjective: Seen and examined.  Complains of pain near catheter insertion site.  Objective: Vitals:   06/06/21 1230 06/06/21 1245 06/06/21 1300 06/06/21 1303  BP: (!) 142/82 (!) 142/80 (!) 152/77 (!) 155/80  Pulse: 74 77 72 71  Resp: 14 14 14 14   Temp:    98 F (36.7 C)  TempSrc:    Oral  SpO2: 100% 100% 100%  100%  Weight:      Height:        Intake/Output Summary (Last 24 hours) at  06/06/2021 1331 Last data filed at 06/06/2021 1303 Gross per 24 hour  Intake 0 ml  Output 376 ml  Net -376 ml   Filed Weights   06/04/21 0500 06/05/21 0356 06/06/21 0936  Weight: 63.9 kg 64 kg 65.1 kg    Examination:  General exam: No acute distress Respiratory system: Lungs clear.  Normal work of breathing.  Room air Cardiovascular system: S1-S2, RRR, no murmurs, no pedal edema Gastrointestinal system: Soft, NT/ND, normal bowel sounds Central nervous system: Alert and oriented. No focal neurological deficits. Extremities: Symmetric 5 x 5 power. Skin: No rashes, lesions or ulcers Psychiatry: Judgement and insight appear normal. Mood & affect flattened.     Data Reviewed: I have personally reviewed following labs and imaging studies  CBC: Recent Labs  Lab 05/31/21 2210 06/01/21 0204 06/01/21 0445 06/02/21 0705 06/03/21 0648 06/04/21 0431  WBC 8.9 8.2 7.0 5.1 4.8 5.2  NEUTROABS 4.9  --   --   --  2.8  --   HGB 9.5* 8.0* 7.5* 7.8* 8.0* 8.3*  HCT 28.6* 24.1* 22.6* 22.9* 23.5* 24.3*  MCV 91.4 90.3 89.3 87.4 88.0 87.1  PLT 231 183 181 196 180 938   Basic Metabolic Panel: Recent Labs  Lab 05/31/21 2210 06/01/21 0026 06/01/21 0030 06/02/21 0705 06/03/21 0648 06/05/21 0951  NA 136  --   --  137 138 133*  K 5.3*  --   --  5.1 4.3 4.8  CL 106  --   --  106 104 97*  CO2 21*  --   --  23 27 27   GLUCOSE 295*  --   --  194* 206* 150*  BUN 69*  --   --  76* 50* 53*  CREATININE 4.61*  --  4.46* 4.69* 3.82* 7.05*  CALCIUM 9.0  --   --  9.1 9.0 9.1  MG  --  1.9  --   --  1.9  --   PHOS  --   --   --   --   --  5.3*   GFR: Estimated Creatinine Clearance: 8.8 mL/min (A) (by C-G formula based on SCr of 7.05 mg/dL (H)). Liver Function Tests: Recent Labs  Lab 06/05/21 0951  ALBUMIN 3.2*   No results for input(s): LIPASE, AMYLASE in the last 168 hours. No results for input(s): AMMONIA in the last 168 hours. Coagulation Profile: Recent Labs  Lab 06/01/21 0204  INR  1.1   Cardiac Enzymes: No results for input(s): CKTOTAL, CKMB, CKMBINDEX, TROPONINI in the last 168 hours. BNP (last 3 results) No results for input(s): PROBNP in the last 8760 hours. HbA1C: No results for input(s): HGBA1C in the last 72 hours.  CBG: Recent Labs  Lab 06/05/21 0805 06/05/21 1142 06/05/21 1635 06/05/21 1950 06/06/21 0900  GLUCAP 156* 192* 265* 107* 193*   Lipid Profile: No results for input(s): CHOL, HDL, LDLCALC, TRIG, CHOLHDL, LDLDIRECT in the last 72 hours. Thyroid Function Tests: No results for input(s): TSH, T4TOTAL, FREET4, T3FREE, THYROIDAB in the last 72 hours. Anemia Panel: No results for input(s): VITAMINB12, FOLATE, FERRITIN, TIBC, IRON, RETICCTPCT in the last 72 hours. Sepsis Labs: No results for input(s): PROCALCITON, LATICACIDVEN in the last 168 hours.  Recent Results (from the past 240 hour(s))  Resp Panel by RT-PCR (Flu A&B, Covid) Nasopharyngeal Swab     Status: None   Collection Time:  05/31/21 10:10 PM   Specimen: Nasopharyngeal Swab; Nasopharyngeal(NP) swabs in vial transport medium  Result Value Ref Range Status   SARS Coronavirus 2 by RT PCR NEGATIVE NEGATIVE Final    Comment: (NOTE) SARS-CoV-2 target nucleic acids are NOT DETECTED.  The SARS-CoV-2 RNA is generally detectable in upper respiratory specimens during the acute phase of infection. The lowest concentration of SARS-CoV-2 viral copies this assay can detect is 138 copies/mL. A negative result does not preclude SARS-Cov-2 infection and should not be used as the sole basis for treatment or other patient management decisions. A negative result may occur with  improper specimen collection/handling, submission of specimen other than nasopharyngeal swab, presence of viral mutation(s) within the areas targeted by this assay, and inadequate number of viral copies(<138 copies/mL). A negative result must be combined with clinical observations, patient history, and  epidemiological information. The expected result is Negative.  Fact Sheet for Patients:  EntrepreneurPulse.com.au  Fact Sheet for Healthcare Providers:  IncredibleEmployment.be  This test is no t yet approved or cleared by the [place] FDA and  has been authorized for detection and/or diagnosis of SARS-CoV-2 by FDA under an Emergency Use Authorization (EUA). This EUA will remain  in effect (meaning this test can be used) for the duration of the COVID-19 declaration under Section 564(b)(1) of the Act, 21 U.S.C.section 360bbb-3(b)(1), unless the authorization is terminated  or revoked sooner.       Influenza A by PCR NEGATIVE NEGATIVE Final   Influenza B by PCR NEGATIVE NEGATIVE Final    Comment: (NOTE) The Xpert Xpress SARS-CoV-2/FLU/RSV plus assay is intended as an aid in the diagnosis of influenza from Nasopharyngeal swab specimens and should not be used as a sole basis for treatment. Nasal washings and aspirates are unacceptable for Xpert Xpress SARS-CoV-2/FLU/RSV testing.  Fact Sheet for Patients: EntrepreneurPulse.com.au  Fact Sheet for Healthcare Providers: IncredibleEmployment.be  This test is not yet approved or cleared by the [place] FDA and has been authorized for detection and/or diagnosis of SARS-CoV-2 by FDA under an Emergency Use Authorization (EUA). This EUA will remain in effect (meaning this test can be used) for the duration of the COVID-19 declaration under Section 564(b)(1) of the Act, 21 U.S.C. section 360bbb-3(b)(1), unless the authorization is terminated or revoked.  Performed at [facility], [address] [place], [place] [address]          Radiology Studies: No results found.      Scheduled Meds:  aspirin EC  81 mg Oral Daily   atorvastatin  80 mg Oral Daily   carvedilol  25 mg Oral BID   Chlorhexidine Gluconate Cloth  6 each Topical Q0600    docusate sodium  100 mg Oral BID   epoetin (EPOGEN/PROCRIT) injection  4,000 Units Intravenous Q M,W,F-HD   [START ON 06/07/2021] furosemide  40 mg Intravenous Q T,Th,Sat-1800   heparin injection (subcutaneous)  5,000 Units Subcutaneous Q8H   hydrALAZINE  100 mg Oral Q8H   insulin aspart  0-15 Units Subcutaneous TID WC   isosorbide mononitrate  30 mg Oral Daily   lidocaine  1 patch Transdermal Q24H   sodium chloride flush  3 mL Intravenous Q12H   sodium chloride flush  3 mL Intravenous Q12H   Continuous Infusions:  sodium chloride       LOS: 5 days    Time spent: 25 minutes    Sidney Ace, MD [facility]   If 7PM-7AM, please contact night-coverage  06/06/2021, 1:31 PM

## 2021-06-06 NOTE — Progress Notes (Signed)
PT Cancellation Note  Patient Details Name: Jimmy Olson MRN: [id number] DOB: 12/26/1960   Cancelled Treatment:    Reason Eval/Treat Not Completed: Patient at procedure or test/unavailable (Chart reviewed, treatment attempted. Pt OTF for HD. Will resume services on later date/time as pt is available.)  12:13 PM, 06/06/21 Etta Grandchild, PT, DPT Physical Therapist - [facility]  [phone] ([place])     [place] [facility] 06/06/2021, 12:13 PM

## 2021-06-07 ENCOUNTER — Other Ambulatory Visit: Payer: Self-pay

## 2021-06-07 LAB — CBC
HCT: 22.4 % — ABNORMAL LOW (ref 39.0–52.0)
Hemoglobin: 7.8 g/dL — ABNORMAL LOW (ref 13.0–17.0)
MCH: 30 pg (ref 26.0–34.0)
MCHC: 34.8 g/dL (ref 30.0–36.0)
MCV: 86.2 fL (ref 80.0–100.0)
Platelets: 191 10*3/uL (ref 150–400)
RBC: 2.6 MIL/uL — ABNORMAL LOW (ref 4.22–5.81)
RDW: 11.7 % (ref 11.5–15.5)
WBC: 5.2 10*3/uL (ref 4.0–10.5)
nRBC: 0 % (ref 0.0–0.2)

## 2021-06-07 LAB — GLUCOSE, CAPILLARY
Glucose-Capillary: 172 mg/dL — ABNORMAL HIGH (ref 70–99)
Glucose-Capillary: 209 mg/dL — ABNORMAL HIGH (ref 70–99)
Glucose-Capillary: 281 mg/dL — ABNORMAL HIGH (ref 70–99)

## 2021-06-07 MED ORDER — ISOSORBIDE MONONITRATE ER 30 MG PO TB24
30.0000 mg | ORAL_TABLET | Freq: Two times a day (BID) | ORAL | 0 refills | Status: DC
  Filled 2021-06-07: qty 60, 30d supply, fill #0

## 2021-06-07 MED ORDER — ATORVASTATIN CALCIUM 80 MG PO TABS
80.0000 mg | ORAL_TABLET | Freq: Every day | ORAL | 0 refills | Status: DC
  Filled 2021-06-07: qty 30, 30d supply, fill #0

## 2021-06-07 MED ORDER — APIXABAN 5 MG PO TABS
ORAL_TABLET | ORAL | 0 refills | Status: DC
  Filled 2021-06-07: qty 84, 36d supply, fill #0

## 2021-06-07 MED ORDER — APIXABAN (ELIQUIS) VTE STARTER PACK (10MG AND 5MG)
ORAL_TABLET | ORAL | 0 refills | Status: DC
  Filled 2021-06-07: qty 1, fill #0

## 2021-06-07 MED ORDER — HYDRALAZINE HCL 100 MG PO TABS
100.0000 mg | ORAL_TABLET | Freq: Three times a day (TID) | ORAL | 0 refills | Status: DC
  Filled 2021-06-07: qty 90, 30d supply, fill #0

## 2021-06-07 MED ORDER — PROAIR HFA 108 (90 BASE) MCG/ACT IN AERS
2.0000 | INHALATION_SPRAY | RESPIRATORY_TRACT | 0 refills | Status: DC | PRN
  Filled 2021-06-07: qty 8.5, 17d supply, fill #0

## 2021-06-07 MED ORDER — GABAPENTIN 300 MG PO CAPS
300.0000 mg | ORAL_CAPSULE | Freq: Every day | ORAL | 0 refills | Status: DC
  Filled 2021-06-07: qty 7, 7d supply, fill #0

## 2021-06-07 MED ORDER — CARVEDILOL 25 MG PO TABS
25.0000 mg | ORAL_TABLET | Freq: Two times a day (BID) | ORAL | 0 refills | Status: DC
  Filled 2021-06-07: qty 60, 30d supply, fill #0

## 2021-06-07 MED ORDER — ASPIRIN 81 MG PO TBEC
81.0000 mg | DELAYED_RELEASE_TABLET | Freq: Every day | ORAL | 0 refills | Status: DC
  Filled 2021-06-07: qty 30, 30d supply, fill #0

## 2021-06-07 MED ORDER — INSULIN PEN NEEDLE 32G X 4 MM MISC
0 refills | Status: DC
  Filled 2021-06-07: qty 100, 100d supply, fill #0

## 2021-06-07 MED ORDER — GABAPENTIN 100 MG PO CAPS
200.0000 mg | ORAL_CAPSULE | Freq: Three times a day (TID) | ORAL | 0 refills | Status: DC
  Filled 2021-06-07: qty 42, 7d supply, fill #0

## 2021-06-07 MED ORDER — LEVEMIR FLEXTOUCH 100 UNIT/ML ~~LOC~~ SOPN
20.0000 [IU] | PEN_INJECTOR | Freq: Every day | SUBCUTANEOUS | 0 refills | Status: DC
  Filled 2021-06-07: qty 15, 75d supply, fill #0

## 2021-06-07 NOTE — Discharge Instructions (Signed)
.  rxavs

## 2021-06-07 NOTE — Progress Notes (Addendum)
Physical Therapy Treatment Patient Details Name: Jimmy Olson MRN: [id number] DOB: 09-24-1960 Today's Date: 06/07/2021   History of Present Illness 60 y.o. male with complaints of shortness of breath, apparently he can typically self-manage with inhaler but that has run out.  Has progressively gotten worse and required O2 here in the ED.    PT Comments    OOB and completes x 2 laps on unit pushing O2 tank most of distance.  Some imbalances but able to self correct.  2 lpm sats remain >97%.   No AD today and pt stated he felt good without RW.  Pt with multiple questions regarding medical plan of care.  Reached out to MD in regards to pt's request to talk with him. "I don't want to die."  Encouragement given.   Recommendations for follow up therapy are one component of a multi-disciplinary discharge planning process, led by the attending physician.  Recommendations may be updated based on patient status, additional functional criteria and insurance authorization.  Follow Up Recommendations  Home health PT     Assistance Recommended at Discharge Intermittent Supervision/Assistance  Equipment Recommendations  None recommended by PT    Recommendations for Other Services       Precautions / Restrictions Precautions Precautions: Fall Restrictions Weight Bearing Restrictions: No     Mobility  Bed Mobility Overal bed mobility: Modified Independent                  Transfers Overall transfer level: Modified independent                      Ambulation/Gait Ambulation/Gait assistance: Supervision Gait Distance (Feet): 320 Feet Assistive device: None Gait Pattern/deviations: Step-through pattern Gait velocity: decreased     General Gait Details: some imbalances but able to self correct and push O2 holder 1 1/2 laps   Stairs             Wheelchair Mobility    Modified Rankin (Stroke Patients Only)       Balance Overall balance assessment:  Needs assistance Sitting-balance support: Feet supported Sitting balance-Leahy Scale: Normal     Standing balance support: No upper extremity supported Standing balance-Leahy Scale: Good                              Cognition Arousal/Alertness: Awake/alert Behavior During Therapy: WFL for tasks assessed/performed Overall Cognitive Status: Within Functional Limits for tasks assessed                                          Exercises      General Comments        Pertinent Vitals/Pain Pain Assessment: No/denies pain    Home Living                          Prior Function            PT Goals (current goals can now be found in the care plan section) Progress towards PT goals: Progressing toward goals    Frequency    Min 2X/week      PT Plan Current plan remains appropriate    Co-evaluation              AM-PAC PT "6 Clicks" Mobility   Outcome Measure  Help needed turning from your back to your side while in a flat bed without using bedrails?: None Help needed moving from lying on your back to sitting on the side of a flat bed without using bedrails?: None Help needed moving to and from a bed to a chair (including a wheelchair)?: None Help needed standing up from a chair using your arms (e.g., wheelchair or bedside chair)?: None Help needed to walk in hospital room?: A Little Help needed climbing 3-5 steps with a railing? : A Little 6 Click Score: 22    End of Session Equipment Utilized During Treatment: Gait belt;Oxygen Activity Tolerance: Patient tolerated treatment well Patient left: in bed;with call bell/phone within reach;with family/visitor present Nurse Communication: Mobility status PT Visit Diagnosis: Muscle weakness (generalized) (M62.81);Difficulty in walking, not elsewhere classified (R26.2)     Time: 0145-0201 PT Time Calculation (min) (ACUTE ONLY): 16 min  Charges:  $Gait Training: 8-22 mins                     Chesley Noon, PTA 06/07/21, 2:12 PM

## 2021-06-07 NOTE — Progress Notes (Signed)
Currently waiting on financial clearance, otherwise patient is scheduled to start tomorrow at [facility].

## 2021-06-07 NOTE — Discharge Summary (Signed)
Physician Discharge Summary  Jimmy Olson CHY:[id number] DOB: [birth year] DOA: 05/31/2021  PCP: [facility] date: 05/31/2021 Discharge date: 06/07/2021  Admitted From: Home Disposition: Home with home health  Recommendations for Outpatient Follow-up:  Follow up with PCP in 1-2 weeks Follow-up with nephrology Follow-up vascular surgery  Home Health: Yes PT OT Equipment/Devices: Yes PermCath right chest  Discharge Condition: Stable CODE STATUS: Full Diet recommendation: Low-sodium  Brief/Interim Summary:  59 y.o. male seen in ed with complaints of shortness of breath that has been going on since earlier today.  Has progressively gotten worse throughout the day.  EMS was called and while attempting to provide supplemental oxygen patient did not tolerate the facemask at that point reported shortness of breath.  Patient denies any other complaints of headache nausea diarrhea fevers chills abdominal pain chest pain palpitations.   Seen in consultation by cardiology and nephrology.  Per conversation with nephrology patient is known to their practice and kidney function has been deteriorating.  Decision was made after speaking with patient and family to initiate hemodialysis.  Vascular surgery involved in care.  Plan for catheter placement and initiation of HD 12/15.   Catheter successfully placed.  Patient initiated on hemodialysis.  Tolerating okay.  Has some mild pain at the dialysis catheter insertion site.  Blood pressures been difficult to control.  Remains suboptimal.   12/20: Patient medically ready for discharge.  Outpatient hemodialysis chair has been arranged however there is some issue with payer source.  Patient is uninsured and may or may not be Medicare eligible.  Dialysis coordinator looking into it.  Patient has an extremely high medication burden and may not have financial means to afford this.  He is essentially medically ready for discharge and  I have completed discharge orders however I feel we are lacking safe disposition plan at this time.  Dialysis coordinator did confirm that patient's Medicare will be able to cover his medications as well as his dialysis.  Should pick up in about 90 days and will retroactively pay.  This is explained the patient.  All medications prescribed at discharge have been delivered to his bed.  Discharge barriers have been settled at this time, patient will be discharged home with home health services.  Despite best efforts patient is a highly complex with multiple severe comorbidities and will remain a high risk for readmission.    Discharge Diagnoses:  Principal Problem:   SOB (shortness of breath) Active Problems:   AKI (acute kidney injury) ([place])   Acute on chronic HFrEF (heart failure with reduced ejection fraction) (HCC)   Demand ischemia (HCC)   Anemia   Acute decompensated heart failure (HCC)  Acute on chronic systolic and diastolic congestive heart failure Acute hypoxic respiratory failure secondary to above Patient with known global heart failure Required BiPAP on admission Able to wean off of BiPAP 12/14 Respiratory status is improved  Weaned to room air as of 12/17 Plan: Dialysis per nephrology recommendations Outpatient HD schedule has been confirmed.  Patient medically stable for discharge at this time   Hypertensive urgency/emergency IV nitro drip discontinued before patient went to dialysis Blood pressure control remains suboptimal Plan: Continue Coreg 25 mg twice daily Imdur 30 mg daily Hydralazine 100 3 times daily Furosemide 40 twice daily Monitor and escalate regimen as necessary Dialysis should help with blood pressure control Very high medication burden.  Unlikely the patient will be able to afford this medication.  1 month supply has been  sent to medication management the patient has great questions about his ability to afford these medications beyond this point.    Right axillary DVT Noted on ultrasound vein mapping Notified vascular surgery nephrology following May require therapeutic anticoagulation Heparin GTT stopped Plan: Discussed case with vascular surgery.  Nephrology and vascular okay with Eliquis.  This is been initiated and 1 month supply has been sent to medication management however patient states he will not be able to afford this medication beyond that time.  Dialysis coordinator and TOC followed up.  Patient is Medicare eligible and will pick up within 90 days.  Retroactive pay.  This is been explained to patient.   NSTEMI Uptrending troponins in the setting of decompensated heart failure Heparin drip stopped by cardiology   Acute kidney injury on chronic kidney disease stage IV Patient known to the nephrology service I have explained the patient's kidney function has deteriorated to the point he will require hemodialysis Patient is agreeable Plan: PermCath in place right chest.   Continue hemodialysis per nephrology recommendations Outpatient follow-up with vascular for vein mapping and fistula creation   Anemia of chronic kidney disease Hemoglobin stable Currently no indication for transfusion    Discharge Instructions  Discharge Instructions     Call MD for:  redness, tenderness, or signs of infection (pain, swelling, bleeding, redness, odor or green/yellow discharge around incision site)   Complete by: As directed    Call MD for:  severe or increased pain, loss or decreased feeling  in affected limb(s)   Complete by: As directed    Call MD for:  temperature >100.5   Complete by: As directed    Diet - low sodium heart healthy   Complete by: As directed    Diet - low sodium heart healthy   Complete by: As directed    Driving Restrictions   Complete by: As directed    No driving for 24 hours   Increase activity slowly   Complete by: As directed    Increase activity slowly   Complete by: As directed    Lifting  restrictions   Complete by: As directed    No lifting for 24 hours   No dressing needed   Complete by: As directed    Replace only if drainage present   Resume previous diet   Complete by: As directed       Allergies as of 06/07/2021   No Known Allergies      Medication List     STOP taking these medications    amLODipine 10 MG tablet Commonly known as: NORVASC   FLUoxetine 10 MG capsule Commonly known as: PROZAC   insulin lispro 100 UNIT/ML injection Commonly known as: HUMALOG   PARoxetine 10 MG tablet Commonly known as: PAXIL   torsemide 20 MG tablet Commonly known as: DEMADEX       TAKE these medications    acetaminophen 325 MG tablet Commonly known as: TYLENOL Take 325 mg by mouth every 6 (six) hours as needed.   Aspirin Adult Low Strength 81 MG EC tablet Generic drug: aspirin Tome 1 tableta (81 mg en total) por va oral al da. (Take 1 tablet (81 mg total) by mouth daily.)   atorvastatin 80 MG tablet Commonly known as: LIPITOR Tome 1 tableta (80 mg en total) por va oral diariamente. (Take 1 tablet (80 mg total) by mouth daily.)   carvedilol 25 MG tablet Commonly known as: COREG Tome 1 tableta (25 mg en total) por  va oral 2 (dos) veces al [employer]. (Take 1 tablet (25 mg total) by mouth 2 (two) times daily.) What changed:  medication strength how much to take Another medication with the same name was removed. Continue taking this medication, and follow the directions you see here.   cetirizine 10 MG tablet Commonly known as: ZYRTEC Take 10 mg by mouth daily as needed for allergies.   Comfort EZ Pen Needles 32G X 4 MM Misc Generic drug: Insulin Pen Needle Use como se indica. (USE AS DIRECTED)   D3-50 1.25 MG (50000 UT) capsule Generic drug: Cholecalciferol Take 50,000 Units by mouth once a week.   Eliquis 5 MG Tabs tablet Generic drug: apixaban Tome 2 tabletas (10 mg en total) por va oral 2 (dos) [place] [address] [place] tome 1  tableta (5 mg en total) por va oral 2 (dos) veces al [employer]. (Take 2 tablets (10 mg total) by mouth 2 (two) times daily for 6 days. Then take 1 tablet (5 mg total) by mouth 2 (two) times daily.) Start taking on: June 07, 2021   gabapentin 300 MG capsule Commonly known as: NEURONTIN Tome 1 cpsula (300 mg en total) por va oral antes de [employer]. (Take 1 capsule (300 mg total) by mouth at bedtime for 7 days.) What changed:  medication strength how much to take   hydrALAZINE 100 MG tablet Commonly known as: APRESOLINE Tome 1 tableta (100 mg en total) por va oral una vez cada 8 (ocho) horas. (Take 1 tablet (100 mg total) by mouth once every 8 (eight) hours.) What changed:  when to take this additional instructions   isosorbide mononitrate 30 MG 24 hr tablet Commonly known as: IMDUR Tome 1 tableta (30 mg en total) por va oral por la maana y al acostarse. (Take 1 tablet (30 mg total) by mouth in the morning and at bedtime.) What changed: Another medication with the same name was removed. Continue taking this medication, and follow the directions you see here.   Levemir FlexTouch 100 UNIT/ML FlexPen Generic drug: insulin detemir Inyecte 20 Unidades en la piel una vez al da a la hora de [place]. (Inject 20 Units into the skin once daily at bedtime.) What changed: medication strength   nitroGLYCERIN 0.4 MG SL tablet Commonly known as: NITROSTAT Place 1 tablet (0.4 mg total) under the tongue every 5 (five) minutes as needed for chest pain.   ProAir HFA 108 (90 Base) MCG/ACT inhaler Generic drug: albuterol Inhale 2 inhalaciones en los pulmones cada 4 (cuatro) horas segn sea necesario. (Inhale 2 puffs into the lungs every 4 (four) hours as needed.)   Vitamin D (Ergocalciferol) 1.25 MG (50000 UNIT) Caps capsule Commonly known as: DRISDOL Take 50,000 Units by mouth every 7 (seven) days.        No Known Allergies  Consultations: Nephrology Vascular  surgery   Procedures/Studies: CARDIAC CATHETERIZATION  Result Date: 06/03/2021   Prox LAD lesion is 40% stenosed.   Mid LAD lesion is 60% stenosed.   Prox RCA lesion is 20% stenosed.   Mid RCA lesion is 30% stenosed.   1st Diag lesion is 70% stenosed.   Dist RCA lesion is 30% stenosed.   RPDA lesion is 40% stenosed.   There is mild left ventricular systolic dysfunction.   LV end diastolic pressure is mildly elevated.   The left ventricular ejection fraction is 45-50% by visual estimate. 1.  Heavily calcified coronary arteries with moderate two-vessel coronary artery disease  involving the LAD and right coronary artery.  No culprit is identified for non-STEMI. 2.  Mildly reduced LV systolic function and mildly elevated left ventricular end-diastolic pressure. Recommendations: The patient disease appears to be chronic with no acute culprit identified.  Suspect that elevated troponin is due to supply demand ischemia.  FFR guided PCI of the LAD can be considered in the future if the patient has refractory anginal symptoms.  For now, recommend aggressive medical therapy.   PERIPHERAL VASCULAR CATHETERIZATION  Result Date: 06/02/2021 See surgical note for result.  [place] UE VEIN MAPPING LEFT (PRE-OP AVF)  Result Date: 06/02/2021 CLINICAL DATA:  Preop evaluation for AV fistula creation EXAM: [place] EXTREM UP VEIN MAPPING COMPARISON:  None. FINDINGS: LEFT ARTERIES Wrist Radial Artery: Size 2.44mm Waveform triphasic Wrist Ulnar Artery: Size 2.3mm Waveform triphasic Prox. Forearm Radial Artery: Size 3.18mm Waveform triphasic Upper Arm Brachial Artery: Size 4.73mm Waveform triphasic LEFT VEINS Forearm Cephalic Vein: Prox 7.2CN Distal 2.58mm Depth 4.7SJ Upper Arm Cephalic Vein: Prox 6.2EZ Distal 4.27mm Depth 6.6-2.9UT Upper Arm Basilic Vein: Prox 6.5YY Distal 3.26mm Depth 3.9-8.79mm Upper Arm Brachial Vein: Prox 4.89mm Distal 3.17mm Depth 8.5-14.26mm ADDITIONAL LEFT VEINS Axillary Vein:  6.48mm (occluded, noncompressible)  Subclavian Vein: Patient: Yes Respiratory Phasicity: Present Internal Jugular Vein: Patent: Yes    Respiratory Phasicity: Present IMPRESSION: 1. DVT noted within the left axillary vein. 2. Left upper extremity vasculature measurements as above. These results will be called to the ordering clinician or representative by the Radiologist Assistant, and communication documented in the PACS or [employer]. Electronically Signed   By: Miachel Roux M.D.   On: 06/02/2021 09:27   DG Chest Portable 1 View  Result Date: 05/31/2021 CLINICAL DATA:  Respiratory distress and shortness of breath. EXAM: PORTABLE CHEST 1 VIEW COMPARISON:  PA Lat chest and chest CT both 12/23/2020 FINDINGS: Mild cardiomegaly is noted with increased central vascular distension and flow cephalization and diffuse increased septal lines consistent with interstitial edema with minimal pleural effusions beginning to develop. There is faint perihilar interstitial haziness which is probably due to ground-glass edema , pneumonitis possible but less likely. No focal consolidation is seen. There is thoracic spondylosis. IMPRESSION: Cardiomegaly with CHF pattern including generalized interstitial edema, perihilar haziness which is probably ground-glass edema and minimal pleural effusions forming. Clinical correlation and radiographic follow-up recommended. Electronically Signed   By: Telford Nab M.D.   On: 05/31/2021 22:13   ECHOCARDIOGRAM COMPLETE  Result Date: 06/02/2021    ECHOCARDIOGRAM REPORT   Patient Name:   CAESAR MANNELLA Date of Exam: 06/02/2021 Medical Rec #:  [id number]     Height:       62.0 in Accession #:    [id number]    Weight:       145.2 lb Date of Birth:  [birth year]     BSA:          1.668 m Patient Age:    35 years      BP:           155/77 mmHg Patient Gender: M             HR:           82 bpm. Exam Location:  [facility] Procedure: 2D Echo, Cardiac Doppler and Color Doppler Indications:     Cardiac arrest I 46.9  History:          Patient has no prior history of Echocardiogram examinations.  CHF; Risk Factors:Hypertension and Diabetes.  Sonographer:     Sherrie Sport Referring Phys:  [place] Diagnosing Phys: Ida Rogue MD IMPRESSIONS  1. Left ventricular ejection fraction, by estimation, is 50 to 55%. The left ventricle has low normal function. The left ventricle has no regional wall motion abnormalities. There is mild left ventricular hypertrophy. Left ventricular diastolic parameters are consistent with Grade I diastolic dysfunction (impaired relaxation).  2. Right ventricular systolic function is normal. The right ventricular size is normal. Tricuspid regurgitation signal is inadequate for assessing PA pressure.  3. The mitral valve is normal in structure. Mild mitral valve regurgitation. No evidence of mitral stenosis.  4. The aortic valve was not well visualized. Aortic valve regurgitation is not visualized. No aortic stenosis is present. FINDINGS  Left Ventricle: Left ventricular ejection fraction, by estimation, is 50 to 55%. The left ventricle has low normal function. The left ventricle has no regional wall motion abnormalities. The left ventricular internal cavity size was normal in size. There is mild left ventricular hypertrophy. Left ventricular diastolic parameters are consistent with Grade I diastolic dysfunction (impaired relaxation). Right Ventricle: The right ventricular size is normal. No increase in right ventricular wall thickness. Right ventricular systolic function is normal. Tricuspid regurgitation signal is inadequate for assessing PA pressure. Left Atrium: Left atrial size was normal in size. Right Atrium: Right atrial size was normal in size. Pericardium: There is no evidence of pericardial effusion. Mitral Valve: The mitral valve is normal in structure. Mild mitral valve regurgitation. No evidence of mitral valve stenosis. MV peak gradient, 6.0 mmHg. The mean mitral valve gradient is 2.0  mmHg. Tricuspid Valve: The tricuspid valve is normal in structure. Tricuspid valve regurgitation is not demonstrated. No evidence of tricuspid stenosis. Aortic Valve: The aortic valve was not well visualized. Aortic valve regurgitation is not visualized. No aortic stenosis is present. Aortic valve mean gradient measures 5.0 mmHg. Aortic valve peak gradient measures 7.7 mmHg. Aortic valve area, by VTI measures 1.90 cm. Pulmonic Valve: The pulmonic valve was normal in structure. Pulmonic valve regurgitation is not visualized. No evidence of pulmonic stenosis. Aorta: The aortic root is normal in size and structure. Venous: The pulmonary veins were not well visualized. The inferior vena cava was not well visualized. IAS/Shunts: No atrial level shunt detected by color flow Doppler.  LEFT VENTRICLE PLAX 2D LVIDd:         5.30 cm   Diastology LVIDs:         3.80 cm   LV e' medial:    3.15 cm/s LV PW:         1.70 cm   LV E/e' medial:  25.0 LV IVS:        1.05 cm   LV e' lateral:   4.46 cm/s LVOT diam:     2.00 cm   LV E/e' lateral: 17.7 LV SV:         54 LV SV Index:   32 LVOT Area:     3.14 cm  RIGHT VENTRICLE RV Basal diam:  4.30 cm RV S prime:     14.30 cm/s TAPSE (M-mode): 4.5 cm LEFT ATRIUM             Index        RIGHT ATRIUM           Index LA diam:        3.60 cm 2.16 cm/m   RA Area:     13.50 cm LA Vol (  A2C):   66.7 ml 39.98 ml/m  RA Volume:   32.50 ml  19.48 ml/m LA Vol (A4C):   66.8 ml 40.04 ml/m LA Biplane Vol: 67.3 ml 40.34 ml/m  AORTIC VALVE                     PULMONIC VALVE AV Area (Vmax):    1.98 cm      PV Vmax:        0.96 m/s AV Area (Vmean):   1.92 cm      PV Vmean:       59.600 cm/s AV Area (VTI):     1.90 cm      PV VTI:         0.157 m AV Vmax:           139.00 cm/s   PV Peak grad:   3.7 mmHg AV Vmean:          103.000 cm/s  PV Mean grad:   2.0 mmHg AV VTI:            0.284 m       RVOT Peak grad: 4 mmHg AV Peak Grad:      7.7 mmHg AV Mean Grad:      5.0 mmHg LVOT Vmax:         87.50  cm/s LVOT Vmean:        62.900 cm/s LVOT VTI:          0.172 m LVOT/AV VTI ratio: 0.61  AORTA Ao Root diam: 3.20 cm MITRAL VALVE                TRICUSPID VALVE MV Area (PHT): 3.46 cm     TR Peak grad:   14.3 mmHg MV Area VTI:   2.57 cm     TR Vmax:        189.00 cm/s MV Peak grad:  6.0 mmHg MV Mean grad:  2.0 mmHg     SHUNTS MV Vmax:       1.22 m/s     Systemic VTI:  0.17 m MV Vmean:      64.9 cm/s    Systemic Diam: 2.00 cm MV Decel Time: 219 msec     Pulmonic VTI:  0.168 m MV E velocity: 78.80 cm/s MV A velocity: 109.00 cm/s MV E/A ratio:  0.72 Ida Rogue MD Electronically signed by Ida Rogue MD Signature Date/Time: 06/02/2021/10:15:30 AM    Final       Subjective: Seen and examined on day of discharge still appears fatigued but overall stable.  Blood pressure control improved despite high medication burden.  Medically stable for discharge  Discharge Exam: Vitals:   06/07/21 1241 06/07/21 1519  BP: (!) 162/78 (!) 143/75  Pulse:  79  Resp:  20  Temp:  98 F (36.7 C)  SpO2:  100%   Vitals:   06/07/21 0736 06/07/21 1103 06/07/21 1241 06/07/21 1519  BP: (!) 158/74 (!) 152/80 (!) 162/78 (!) 143/75  Pulse: 84 80  79  Resp: 20 16  20   Temp: 98.2 F (36.8 C) 98.2 F (36.8 C)  98 F (36.7 C)  TempSrc: Oral Oral  Oral  SpO2: 96% 95%  100%  Weight:      Height:        General: Pt is alert, awake, not in acute distress Cardiovascular: RRR, S1/S2 +, no rubs, no gallops Respiratory: CTA bilaterally, no wheezing, no rhonchi Abdominal: Soft, NT, ND, bowel sounds +  Extremities: no edema, no cyanosis    The results of significant diagnostics from this hospitalization (including imaging, microbiology, ancillary and laboratory) are listed below for reference.     Microbiology: Recent Results (from the past 240 hour(s))  Resp Panel by RT-PCR (Flu A&B, Covid) Nasopharyngeal Swab     Status: None   Collection Time: 05/31/21 10:10 PM   Specimen: Nasopharyngeal Swab;  Nasopharyngeal(NP) swabs in vial transport medium  Result Value Ref Range Status   SARS Coronavirus 2 by RT PCR NEGATIVE NEGATIVE Final    Comment: (NOTE) SARS-CoV-2 target nucleic acids are NOT DETECTED.  The SARS-CoV-2 RNA is generally detectable in upper respiratory specimens during the acute phase of infection. The lowest concentration of SARS-CoV-2 viral copies this assay can detect is 138 copies/mL. A negative result does not preclude SARS-Cov-2 infection and should not be used as the sole basis for treatment or other patient management decisions. A negative result may occur with  improper specimen collection/handling, submission of specimen other than nasopharyngeal swab, presence of viral mutation(s) within the areas targeted by this assay, and inadequate number of viral copies(<138 copies/mL). A negative result must be combined with clinical observations, patient history, and epidemiological information. The expected result is Negative.  Fact Sheet for Patients:  EntrepreneurPulse.com.au  Fact Sheet for Healthcare Providers:  IncredibleEmployment.be  This test is no t yet approved or cleared by the [place] FDA and  has been authorized for detection and/or diagnosis of SARS-CoV-2 by FDA under an Emergency Use Authorization (EUA). This EUA will remain  in effect (meaning this test can be used) for the duration of the COVID-19 declaration under Section 564(b)(1) of the Act, 21 U.S.C.section 360bbb-3(b)(1), unless the authorization is terminated  or revoked sooner.       Influenza A by PCR NEGATIVE NEGATIVE Final   Influenza B by PCR NEGATIVE NEGATIVE Final    Comment: (NOTE) The Xpert Xpress SARS-CoV-2/FLU/RSV plus assay is intended as an aid in the diagnosis of influenza from Nasopharyngeal swab specimens and should not be used as a sole basis for treatment. Nasal washings and aspirates are unacceptable for Xpert Xpress  SARS-CoV-2/FLU/RSV testing.  Fact Sheet for Patients: EntrepreneurPulse.com.au  Fact Sheet for Healthcare Providers: IncredibleEmployment.be  This test is not yet approved or cleared by the [place] FDA and has been authorized for detection and/or diagnosis of SARS-CoV-2 by FDA under an Emergency Use Authorization (EUA). This EUA will remain in effect (meaning this test can be used) for the duration of the COVID-19 declaration under Section 564(b)(1) of the Act, 21 U.S.C. section 360bbb-3(b)(1), unless the authorization is terminated or revoked.  Performed at [facility], [address] [place], [place] [address]      Labs: BNP (last 3 results) Recent Labs    07/20/20 0443 12/23/20 1657 05/31/21 2210  BNP 584.1* 121.0* 6,045.4*   Basic Metabolic Panel: Recent Labs  Lab 05/31/21 2210 06/01/21 0026 06/01/21 0030 06/02/21 0705 06/03/21 0648 06/05/21 0951  NA 136  --   --  137 138 133*  K 5.3*  --   --  5.1 4.3 4.8  CL 106  --   --  106 104 97*  CO2 21*  --   --  23 27 27   GLUCOSE 295*  --   --  194* 206* 150*  BUN 69*  --   --  76* 50* 53*  CREATININE 4.61*  --  4.46* 4.69* 3.82* 7.05*  CALCIUM 9.0  --   --  9.1 9.0 9.1  MG  --  1.9  --   --  1.9  --   PHOS  --   --   --   --   --  5.3*   Liver Function Tests: Recent Labs  Lab 06/05/21 0951  ALBUMIN 3.2*   No results for input(s): LIPASE, AMYLASE in the last 168 hours. No results for input(s): AMMONIA in the last 168 hours. CBC: Recent Labs  Lab 05/31/21 2210 06/01/21 0204 06/01/21 0445 06/02/21 0705 06/03/21 0648 06/04/21 0431 06/07/21 0618  WBC 8.9   < > 7.0 5.1 4.8 5.2 5.2  NEUTROABS 4.9  --   --   --  2.8  --   --   HGB 9.5*   < > 7.5* 7.8* 8.0* 8.3* 7.8*  HCT 28.6*   < > 22.6* 22.9* 23.5* 24.3* 22.4*  MCV 91.4   < > 89.3 87.4 88.0 87.1 86.2  PLT 231   < > 181 196 180 178 191   < > = values in this interval not displayed.   Cardiac  Enzymes: No results for input(s): CKTOTAL, CKMB, CKMBINDEX, TROPONINI in the last 168 hours. BNP: Invalid input(s): POCBNP CBG: Recent Labs  Lab 06/06/21 1648 06/06/21 2044 06/07/21 0739 06/07/21 1151 06/07/21 1520  GLUCAP 247* 200* 172* 209* 281*   D-Dimer No results for input(s): DDIMER in the last 72 hours. Hgb A1c No results for input(s): HGBA1C in the last 72 hours. Lipid Profile No results for input(s): CHOL, HDL, LDLCALC, TRIG, CHOLHDL, LDLDIRECT in the last 72 hours. Thyroid function studies No results for input(s): TSH, T4TOTAL, T3FREE, THYROIDAB in the last 72 hours.  Invalid input(s): FREET3 Anemia work up No results for input(s): VITAMINB12, FOLATE, FERRITIN, TIBC, IRON, RETICCTPCT in the last 72 hours. Urinalysis No results found for: COLORURINE, APPEARANCEUR, [place], [place], GLUCOSEU, [place], [place], [place], PROTEINUR, UROBILINOGEN, NITRITE, LEUKOCYTESUR Sepsis Labs Invalid input(s): PROCALCITONIN,  WBC,  LACTICIDVEN Microbiology Recent Results (from the past 240 hour(s))  Resp Panel by RT-PCR (Flu A&B, Covid) Nasopharyngeal Swab     Status: None   Collection Time: 05/31/21 10:10 PM   Specimen: Nasopharyngeal Swab; Nasopharyngeal(NP) swabs in vial transport medium  Result Value Ref Range Status   SARS Coronavirus 2 by RT PCR NEGATIVE NEGATIVE Final    Comment: (NOTE) SARS-CoV-2 target nucleic acids are NOT DETECTED.  The SARS-CoV-2 RNA is generally detectable in upper respiratory specimens during the acute phase of infection. The lowest concentration of SARS-CoV-2 viral copies this assay can detect is 138 copies/mL. A negative result does not preclude SARS-Cov-2 infection and should not be used as the sole basis for treatment or other patient management decisions. A negative result may occur with  improper specimen collection/handling, submission of specimen other than nasopharyngeal swab, presence of viral mutation(s) within the areas targeted  by this assay, and inadequate number of viral copies(<138 copies/mL). A negative result must be combined with clinical observations, patient history, and epidemiological information. The expected result is Negative.  Fact Sheet for Patients:  EntrepreneurPulse.com.au  Fact Sheet for Healthcare Providers:  IncredibleEmployment.be  This test is no t yet approved or cleared by the [place] FDA and  has been authorized for detection and/or diagnosis of SARS-CoV-2 by FDA under an Emergency Use Authorization (EUA). This EUA will remain  in effect (meaning this test can be used) for the duration of the COVID-19 declaration under Section 564(b)(1) of the Act, 21 U.S.C.section 360bbb-3(b)(1), unless the authorization is terminated  or revoked  sooner.       Influenza A by PCR NEGATIVE NEGATIVE Final   Influenza B by PCR NEGATIVE NEGATIVE Final    Comment: (NOTE) The Xpert Xpress SARS-CoV-2/FLU/RSV plus assay is intended as an aid in the diagnosis of influenza from Nasopharyngeal swab specimens and should not be used as a sole basis for treatment. Nasal washings and aspirates are unacceptable for Xpert Xpress SARS-CoV-2/FLU/RSV testing.  Fact Sheet for Patients: EntrepreneurPulse.com.au  Fact Sheet for Healthcare Providers: IncredibleEmployment.be  This test is not yet approved or cleared by the [place] FDA and has been authorized for detection and/or diagnosis of SARS-CoV-2 by FDA under an Emergency Use Authorization (EUA). This EUA will remain in effect (meaning this test can be used) for the duration of the COVID-19 declaration under Section 564(b)(1) of the Act, 21 U.S.C. section 360bbb-3(b)(1), unless the authorization is terminated or revoked.  Performed at [facility], [address] [place], [place] [address]      Time coordinating discharge: Over 30  minutes  SIGNED:   Sidney Ace, MD  [facility] 06/07/2021, 3:25 PM Pager   If 7PM-7AM, please contact night-coverage

## 2021-06-07 NOTE — Progress Notes (Signed)
PROGRESS NOTE    Jimmy Olson  HQI:[id number] DOB: [birth year] DOA: 05/31/2021 PCP: [name]    Brief Narrative:   60 y.o. male seen in ed with complaints of shortness of breath that has been going on since earlier today.  Has progressively gotten worse throughout the day.  EMS was called and while attempting to provide supplemental oxygen patient did not tolerate the facemask at that point reported shortness of breath.  Patient denies any other complaints of headache nausea diarrhea fevers chills abdominal pain chest pain palpitations.  Seen in consultation by cardiology and nephrology.  Per conversation with nephrology patient is known to their practice and kidney function has been deteriorating.  Decision was made after speaking with patient and family to initiate hemodialysis.  Vascular surgery involved in care.  Plan for catheter placement and initiation of HD 12/15.  Catheter successfully placed.  Patient initiated on hemodialysis.  Tolerating okay.  Has some mild pain at the dialysis catheter insertion site.  Blood pressures been difficult to control.  Remains suboptimal.  12/20: Patient medically ready for discharge.  Outpatient hemodialysis chair has been arranged however there is some issue with payer source.  Patient is uninsured and may or may not be Medicare eligible.  Dialysis coordinator looking into it.  Patient has an extremely high medication burden and may not have financial means to afford this.  He is essentially medically ready for discharge and I have completed discharge orders however I feel we are lacking safe disposition plan at this time.   Assessment & Plan:   Principal Problem:   SOB (shortness of breath) Active Problems:   AKI (acute kidney injury) ([place])   Acute on chronic HFrEF (heart failure with reduced ejection fraction) (H[facility])   Demand ischemia (HCC)   Anemia   Acute decompensated heart failure (HCC)   Acute on chronic  systolic and diastolic congestive heart failure Acute hypoxic respiratory failure secondary to above Patient with known global heart failure Required BiPAP on admission Able to wean off of BiPAP 12/14 Respiratory status is improved  Weaned to room air as of 12/17 Plan: Dialysis per nephrology recommendations Can consider ambulatory desaturation test but patient is on room air and normal work of breathing  Hypertensive urgency/emergency IV nitro drip discontinued before patient went to dialysis Blood pressure control remains suboptimal Plan: Continue Coreg 25 mg twice daily Imdur 30 mg daily Hydralazine 100 3 times daily Furosemide 40 twice daily Monitor and escalate regimen as necessary Dialysis should help with blood pressure control Very high medication burden.  Unlikely the patient will be able to afford this medication.  1 month supply has been sent to medication management the patient has great questions about his ability to afford these medications beyond this point.  Right axillary DVT Noted on ultrasound vein mapping Notified vascular surgery nephrology following May require therapeutic anticoagulation Heparin GTT stopped Plan: Discussed case with vascular surgery.  Nephrology and vascular okay with Eliquis.  This is been initiated and 1 month supply has been sent to medication management however patient states he will not be able to afford this medication beyond that time.  Will need follow-up from [facility] and dialysis coordinator  NSTEMI Uptrending troponins in the setting of decompensated heart failure Heparin drip stopped by cardiology  Acute kidney injury on chronic kidney disease stage IV Patient known to the nephrology service I have explained the patient's kidney function has deteriorated to the point he will require hemodialysis Patient is  agreeable Plan: PermCath in place right chest.   Continue hemodialysis per nephrology recommendations Outpatient follow-up  with vascular for vein mapping and fistula creation  Anemia of chronic kidney disease Hemoglobin stable Currently no indication for transfusion   DVT prophylaxis: Eliquis Code Status: Full Family Communication: Attempted to call spouse [name] [phone] on 12/14.  No answer.  Voicemail not left.  Discussed with spouse at bedside 12/15 Disposition Plan: Status is: Inpatient  Remains inpatient appropriate because: Acute decompensated heart failure.  New dialysis start.  Refractory hypertension.  High medication burden.  Patient is essentially medically ready for discharge however I feel we are lacking safe disposition plan and I have canceled the discharge for today.  Level of care: Progressive  Consultants:  Cardiology Nephrology Vascular surgery  Procedures:  None  Antimicrobials: None   Subjective: Seen and examined.  Complains of pain near catheter insertion site.  Objective: Vitals:   06/07/21 0500 06/07/21 0736 06/07/21 1103 06/07/21 1241  BP:  (!) 158/74 (!) 152/80 (!) 162/78  Pulse:  84 80   Resp:  20 16   Temp:  98.2 F (36.8 C) 98.2 F (36.8 C)   TempSrc:  Oral Oral   SpO2:  96% 95%   Weight: 64.8 kg     Height:        Intake/Output Summary (Last 24 hours) at 06/07/2021 1422 Last data filed at 06/07/2021 1350 Gross per 24 hour  Intake 360 ml  Output 300 ml  Net 60 ml   Filed Weights   06/05/21 0356 06/06/21 0936 06/07/21 0500  Weight: 64 kg 65.1 kg 64.8 kg    Examination:  General exam: No acute distress.  Appears fatigued Respiratory system: Poor respiratory effort.  Lungs clear.  Normal work of breathing.  Room air Cardiovascular system: S1-S2, RRR, no murmurs, no pedal edema Gastrointestinal system: Soft, NT/ND, normal bowel sounds Central nervous system: Alert and oriented. No focal neurological deficits. Extremities: Symmetric 5 x 5 power. Skin: No rashes, lesions or ulcers Psychiatry: Judgement and insight appear normal. Mood & affect  flattened.     Data Reviewed: I have personally reviewed following labs and imaging studies  CBC: Recent Labs  Lab 05/31/21 2210 06/01/21 0204 06/01/21 0445 06/02/21 0705 06/03/21 0648 06/04/21 0431 06/07/21 0618  WBC 8.9   < > 7.0 5.1 4.8 5.2 5.2  NEUTROABS 4.9  --   --   --  2.8  --   --   HGB 9.5*   < > 7.5* 7.8* 8.0* 8.3* 7.8*  HCT 28.6*   < > 22.6* 22.9* 23.5* 24.3* 22.4*  MCV 91.4   < > 89.3 87.4 88.0 87.1 86.2  PLT 231   < > 181 196 180 178 191   < > = values in this interval not displayed.   Basic Metabolic Panel: Recent Labs  Lab 05/31/21 2210 06/01/21 0026 06/01/21 0030 06/02/21 0705 06/03/21 0648 06/05/21 0951  NA 136  --   --  137 138 133*  K 5.3*  --   --  5.1 4.3 4.8  CL 106  --   --  106 104 97*  CO2 21*  --   --  23 27 27   GLUCOSE 295*  --   --  194* 206* 150*  BUN 69*  --   --  76* 50* 53*  CREATININE 4.61*  --  4.46* 4.69* 3.82* 7.05*  CALCIUM 9.0  --   --  9.1 9.0 9.1  MG  --  1.9  --   --  1.9  --   PHOS  --   --   --   --   --  5.3*   GFR: Estimated Creatinine Clearance: 8.8 mL/min (A) (by C-G formula based on SCr of 7.05 mg/dL (H)). Liver Function Tests: Recent Labs  Lab 06/05/21 0951  ALBUMIN 3.2*   No results for input(s): LIPASE, AMYLASE in the last 168 hours. No results for input(s): AMMONIA in the last 168 hours. Coagulation Profile: Recent Labs  Lab 06/01/21 0204  INR 1.1   Cardiac Enzymes: No results for input(s): CKTOTAL, CKMB, CKMBINDEX, TROPONINI in the last 168 hours. BNP (last 3 results) No results for input(s): PROBNP in the last 8760 hours. HbA1C: No results for input(s): HGBA1C in the last 72 hours.  CBG: Recent Labs  Lab 06/06/21 1339 06/06/21 1648 06/06/21 2044 06/07/21 0739 06/07/21 1151  GLUCAP 138* 247* 200* 172* 209*   Lipid Profile: No results for input(s): CHOL, HDL, LDLCALC, TRIG, CHOLHDL, LDLDIRECT in the last 72 hours. Thyroid Function Tests: No results for input(s): TSH, T4TOTAL, FREET4,  T3FREE, THYROIDAB in the last 72 hours. Anemia Panel: No results for input(s): VITAMINB12, FOLATE, FERRITIN, TIBC, IRON, RETICCTPCT in the last 72 hours. Sepsis Labs: No results for input(s): PROCALCITON, LATICACIDVEN in the last 168 hours.  Recent Results (from the past 240 hour(s))  Resp Panel by RT-PCR (Flu A&B, Covid) Nasopharyngeal Swab     Status: None   Collection Time: 05/31/21 10:10 PM   Specimen: Nasopharyngeal Swab; Nasopharyngeal(NP) swabs in vial transport medium  Result Value Ref Range Status   SARS Coronavirus 2 by RT PCR NEGATIVE NEGATIVE Final    Comment: (NOTE) SARS-CoV-2 target nucleic acids are NOT DETECTED.  The SARS-CoV-2 RNA is generally detectable in upper respiratory specimens during the acute phase of infection. The lowest concentration of SARS-CoV-2 viral copies this assay can detect is 138 copies/mL. A negative result does not preclude SARS-Cov-2 infection and should not be used as the sole basis for treatment or other patient management decisions. A negative result may occur with  improper specimen collection/handling, submission of specimen other than nasopharyngeal swab, presence of viral mutation(s) within the areas targeted by this assay, and inadequate number of viral copies(<138 copies/mL). A negative result must be combined with clinical observations, patient history, and epidemiological information. The expected result is Negative.  Fact Sheet for Patients:  EntrepreneurPulse.com.au  Fact Sheet for Healthcare Providers:  IncredibleEmployment.be  This test is no t yet approved or cleared by the [place] FDA and  has been authorized for detection and/or diagnosis of SARS-CoV-2 by FDA under an Emergency Use Authorization (EUA). This EUA will remain  in effect (meaning this test can be used) for the duration of the COVID-19 declaration under Section 564(b)(1) of the Act, 21 U.S.C.section 360bbb-3(b)(1),  unless the authorization is terminated  or revoked sooner.       Influenza A by PCR NEGATIVE NEGATIVE Final   Influenza B by PCR NEGATIVE NEGATIVE Final    Comment: (NOTE) The Xpert Xpress SARS-CoV-2/FLU/RSV plus assay is intended as an aid in the diagnosis of influenza from Nasopharyngeal swab specimens and should not be used as a sole basis for treatment. Nasal washings and aspirates are unacceptable for Xpert Xpress SARS-CoV-2/FLU/RSV testing.  Fact Sheet for Patients: EntrepreneurPulse.com.au  Fact Sheet for Healthcare Providers: IncredibleEmployment.be  This test is not yet approved or cleared by the [place] FDA and has been authorized for detection and/or diagnosis of SARS-CoV-2  by FDA under an Emergency Use Authorization (EUA). This EUA will remain in effect (meaning this test can be used) for the duration of the COVID-19 declaration under Section 564(b)(1) of the Act, 21 U.S.C. section 360bbb-3(b)(1), unless the authorization is terminated or revoked.  Performed at [facility], [address], [place], [place] [address]          Radiology Studies: No results found.      Scheduled Meds:  apixaban  10 mg Oral BID   Followed by   Derrill Memo ON 06/13/2021] apixaban  5 mg Oral BID   aspirin EC  81 mg Oral Daily   atorvastatin  80 mg Oral Daily   carvedilol  25 mg Oral BID   Chlorhexidine Gluconate Cloth  6 each Topical Q0600   docusate sodium  100 mg Oral BID   epoetin (EPOGEN/PROCRIT) injection  4,000 Units Intravenous Q M,W,F-HD   furosemide  40 mg Intravenous Q T,Th,Sat-1800   hydrALAZINE  100 mg Oral Q8H   insulin aspart  0-15 Units Subcutaneous TID WC   isosorbide mononitrate  30 mg Oral Daily   lidocaine  1 patch Transdermal Q24H   sodium chloride flush  3 mL Intravenous Q12H   sodium chloride flush  3 mL Intravenous Q12H   Continuous Infusions:  sodium chloride       LOS: 6 days    Time  spent: 25 minutes    Sidney Ace, MD [facility]   If 7PM-7AM, please contact night-coverage  06/07/2021, 2:22 PM

## 2021-06-07 NOTE — Plan of Care (Signed)
°  Problem: Education: Goal: Understanding of CV disease, CV risk reduction, and recovery process will improve Outcome: Progressing Goal: Individualized Educational Video(s) Outcome: Progressing   Problem: Activity: Goal: Ability to return to baseline activity level will improve Outcome: Progressing   Problem: Cardiovascular: Goal: Ability to achieve and maintain adequate cardiovascular perfusion will improve Outcome: Progressing Goal: Vascular access site(s) Level 0-1 will be maintained Outcome: Progressing   Problem: Health Behavior/Discharge Planning: Goal: Ability to safely manage health-related needs after discharge will improve Outcome: Progressing   Problem: Education: Goal: Knowledge of General Education information will improve Description: Including pain rating scale, medication(s)/side effects and non-pharmacologic comfort measures Outcome: Progressing   Problem: Health Behavior/Discharge Planning: Goal: Ability to manage health-related needs will improve Outcome: Progressing   Problem: Clinical Measurements: Goal: Ability to maintain clinical measurements within normal limits will improve Outcome: Progressing Goal: Will remain free from infection Outcome: Progressing Goal: Diagnostic test results will improve Outcome: Progressing Goal: Respiratory complications will improve Outcome: Progressing Goal: Cardiovascular complication will be avoided Outcome: Progressing   Problem: Activity: Goal: Risk for activity intolerance will decrease Outcome: Progressing   Problem: Nutrition: Goal: Adequate nutrition will be maintained Outcome: Progressing   Problem: Coping: Goal: Level of anxiety will decrease Outcome: Progressing   Problem: Elimination: Goal: Will not experience complications related to bowel motility Outcome: Progressing Goal: Will not experience complications related to urinary retention Outcome: Progressing   Problem: Pain Managment: Goal:  General experience of comfort will improve Outcome: Progressing   Problem: Safety: Goal: Ability to remain free from injury will improve Outcome: Progressing   Problem: Skin Integrity: Goal: Risk for impaired skin integrity will decrease Outcome: Progressing   Problem: Education: Goal: Knowledge of disease and its progression will improve Outcome: Progressing Goal: Individualized Educational Video(s) Outcome: Progressing   Problem: Fluid Volume: Goal: Compliance with measures to maintain balanced fluid volume will improve Outcome: Progressing   Problem: Health Behavior/Discharge Planning: Goal: Ability to manage health-related needs will improve Outcome: Progressing   Problem: Nutritional: Goal: Ability to make healthy dietary choices will improve Outcome: Progressing   Problem: Clinical Measurements: Goal: Complications related to the disease process, condition or treatment will be avoided or minimized Outcome: Progressing

## 2021-06-07 NOTE — Progress Notes (Signed)
[facility]  ROUNDING NOTE   Subjective:   Jimmy Olson is a 60 year old male with a past medical history consisting of coronary artery disease, diabetes, hypertension, chronic kidney disease stage IV.  He presents to the emergency department with complaints of shortness of breath.  He will be admitted for SOB (shortness of breath) [R06.02] Kidney damage [S37.009A] Acute decompensated heart failure ([place]) [I50.9] Hypertension, unspecified type [I10] Congestive heart failure, unspecified HF chronicity, unspecified heart failure type ([place]) [I50.9]  Update Patient seen resting in bed, alert and oriented Tolerating small meals, denies nausea and vomiting Denies shortness of breath dialysis yesterday tolerated well    Objective:  Vital signs in last 24 hours:  Temp:  [98 F (36.7 C)-98.8 F (37.1 C)] 98.2 F (36.8 C) (12/20 0736) Pulse Rate:  [71-87] 84 (12/20 0736) Resp:  [10-20] 20 (12/20 0736) BP: (106-168)/(67-83) 158/74 (12/20 0736) SpO2:  [96 %-100 %] 96 % (12/20 0736) Weight:  [64.8 kg] 64.8 kg (12/20 0500)  Weight change:  Filed Weights   06/05/21 0356 06/06/21 0936 06/07/21 0500  Weight: 64 kg 65.1 kg 64.8 kg    Intake/Output: I/O last 3 completed shifts: In: -  Out: 976 [Urine:600; Other:376]   Intake/Output this shift:  Total I/O In: 240 [P.O.:240] Out: -   Physical Exam: General: NAD, resting in bed  Head: Normocephalic, atraumatic. Moist oral mucosal membranes  Eyes: Anicteric  Lungs:  Clear to ausculatation, normal effort  Heart: Regular rate and rhythm  Abdomen:  Soft, nontender  Extremities:  no peripheral edema.  Neurologic: Nonfocal, moving all four extremities  Skin: No lesions  Access: Rt IJ Permcath    Basic Metabolic Panel: Recent Labs  Lab 05/31/21 2210 06/01/21 0026 06/01/21 0030 06/02/21 0705 06/03/21 0648 06/05/21 0951  NA 136  --   --  137 138 133*  K 5.3*  --   --  5.1 4.3 4.8  CL 106  --   --  106 104 97*   CO2 21*  --   --  23 27 27   GLUCOSE 295*  --   --  194* 206* 150*  BUN 69*  --   --  76* 50* 53*  CREATININE 4.61*  --  4.46* 4.69* 3.82* 7.05*  CALCIUM 9.0  --   --  9.1 9.0 9.1  MG  --  1.9  --   --  1.9  --   PHOS  --   --   --   --   --  5.3*     Liver Function Tests: Recent Labs  Lab 06/05/21 0951  ALBUMIN 3.2*    No results for input(s): LIPASE, AMYLASE in the last 168 hours. No results for input(s): AMMONIA in the last 168 hours.  CBC: Recent Labs  Lab 05/31/21 2210 06/01/21 0204 06/01/21 0445 06/02/21 0705 06/03/21 0648 06/04/21 0431 06/07/21 0618  WBC 8.9   < > 7.0 5.1 4.8 5.2 5.2  NEUTROABS 4.9  --   --   --  2.8  --   --   HGB 9.5*   < > 7.5* 7.8* 8.0* 8.3* 7.8*  HCT 28.6*   < > 22.6* 22.9* 23.5* 24.3* 22.4*  MCV 91.4   < > 89.3 87.4 88.0 87.1 86.2  PLT 231   < > 181 196 180 178 191   < > = values in this interval not displayed.     Cardiac Enzymes: No results for input(s): CKTOTAL, CKMB, CKMBINDEX, TROPONINI in the  last 168 hours.  BNP: Invalid input(s): POCBNP  CBG: Recent Labs  Lab 06/06/21 0900 06/06/21 1339 06/06/21 1648 06/06/21 2044 06/07/21 0739  GLUCAP 193* 138* 247* 200* 172*     Microbiology: Results for orders placed or performed during the hospital encounter of 05/31/21  Resp Panel by RT-PCR (Flu A&B, Covid) Nasopharyngeal Swab     Status: None   Collection Time: 05/31/21 10:10 PM   Specimen: Nasopharyngeal Swab; Nasopharyngeal(NP) swabs in vial transport medium  Result Value Ref Range Status   SARS Coronavirus 2 by RT PCR NEGATIVE NEGATIVE Final    Comment: (NOTE) SARS-CoV-2 target nucleic acids are NOT DETECTED.  The SARS-CoV-2 RNA is generally detectable in upper respiratory specimens during the acute phase of infection. The lowest concentration of SARS-CoV-2 viral copies this assay can detect is 138 copies/mL. A negative result does not preclude SARS-Cov-2 infection and should not be used as the sole basis for  treatment or other patient management decisions. A negative result may occur with  improper specimen collection/handling, submission of specimen other than nasopharyngeal swab, presence of viral mutation(s) within the areas targeted by this assay, and inadequate number of viral copies(<138 copies/mL). A negative result must be combined with clinical observations, patient history, and epidemiological information. The expected result is Negative.  Fact Sheet for Patients:  EntrepreneurPulse.com.au  Fact Sheet for Healthcare Providers:  IncredibleEmployment.be  This test is no t yet approved or cleared by the [place] FDA and  has been authorized for detection and/or diagnosis of SARS-CoV-2 by FDA under an Emergency Use Authorization (EUA). This EUA will remain  in effect (meaning this test can be used) for the duration of the COVID-19 declaration under Section 564(b)(1) of the Act, 21 U.S.C.section 360bbb-3(b)(1), unless the authorization is terminated  or revoked sooner.       Influenza A by PCR NEGATIVE NEGATIVE Final   Influenza B by PCR NEGATIVE NEGATIVE Final    Comment: (NOTE) The Xpert Xpress SARS-CoV-2/FLU/RSV plus assay is intended as an aid in the diagnosis of influenza from Nasopharyngeal swab specimens and should not be used as a sole basis for treatment. Nasal washings and aspirates are unacceptable for Xpert Xpress SARS-CoV-2/FLU/RSV testing.  Fact Sheet for Patients: EntrepreneurPulse.com.au  Fact Sheet for Healthcare Providers: IncredibleEmployment.be  This test is not yet approved or cleared by the [place] FDA and has been authorized for detection and/or diagnosis of SARS-CoV-2 by FDA under an Emergency Use Authorization (EUA). This EUA will remain in effect (meaning this test can be used) for the duration of the COVID-19 declaration under Section 564(b)(1) of the Act, 21  U.S.C. section 360bbb-3(b)(1), unless the authorization is terminated or revoked.  Performed at [facility], [address] [place], [place] [address]     Coagulation Studies: No results for input(s): LABPROT, INR in the last 72 hours.   Urinalysis: No results for input(s): COLORURINE, LABSPEC, PHURINE, GLUCOSEU, HGBUR, BILIRUBINUR, KETONESUR, PROTEINUR, UROBILINOGEN, NITRITE, LEUKOCYTESUR in the last 72 hours.  Invalid input(s): APPERANCEUR    Imaging: No results found.   Medications:    sodium chloride      apixaban  10 mg Oral BID   Followed by   Derrill Memo ON 06/13/2021] apixaban  5 mg Oral BID   aspirin EC  81 mg Oral Daily   atorvastatin  80 mg Oral Daily   carvedilol  25 mg Oral BID   Chlorhexidine Gluconate Cloth  6 each Topical Q0600   docusate sodium  100 mg Oral  BID   epoetin (EPOGEN/PROCRIT) injection  4,000 Units Intravenous Q M,W,F-HD   furosemide  40 mg Intravenous Q T,Th,Sat-1800   hydrALAZINE  100 mg Oral Q8H   insulin aspart  0-15 Units Subcutaneous TID WC   isosorbide mononitrate  30 mg Oral Daily   lidocaine  1 patch Transdermal Q24H   sodium chloride flush  3 mL Intravenous Q12H   sodium chloride flush  3 mL Intravenous Q12H   sodium chloride, acetaminophen **OR** acetaminophen, albuterol, hydrALAZINE, HYDROmorphone (DILAUDID) injection, LORazepam, morphine injection, ondansetron **OR** ondansetron (ZOFRAN) IV, oxyCODONE, polyethylene glycol, sodium chloride flush  Assessment/ Plan:  Jimmy Olson is a 60 y.o.  male ith a past medical history consisting of coronary artery disease, diabetes, hypertension, chronic kidney disease stage IV.  He presents to the emergency department with complaints of shortness of breath.  He will be admitted for SOB (shortness of breath) [R06.02] Kidney damage [S37.009A] Acute decompensated heart failure (HCC) [I50.9] Hypertension, unspecified type [I10] Congestive heart failure, unspecified HF  chronicity, unspecified heart failure type ([place]) [I50.9]   End-stage renal disease requiring hemodialysis.  Based on chart review and recent office note.  We feel patient kidney disease has progressed to stage V requiring initiation of renal replacement therapy. Appreciate vascular placing Permcath 06/02/21 and dialysis initiated on that day.   -Received third dialysis treatment yesterday, tolerated well  -Next dialysis treatment scheduled for Wednesday.  -Appreciate dialysis coordinator confirming outpatient clinic and [facility], on MWF schedule.  Patient can receive first treatment on Wednesday.  2. Anemia of chronic kidney disease Lab Results  Component Value Date   HGB 7.8 (L) 06/07/2021   Continue EPO at low-dose during dialysis treatments  3. Secondary Hyperparathyroidism:  Lab Results  Component Value Date   CALCIUM 9.1 06/05/2021   PHOS 5.3 (H) 06/05/2021  Calcium at goal.  Phosphorus remains within acceptable limits.  4. Diabetes mellitus type II with chronic kidney disease  insulin dependent. Home regimen includes NovoLog and Levemir. Most recent hemoglobin A1c is 10.3 on 12/24/20.  Primary team managing SSI.   5.  Hypertension with chronic kidney disease.  Home regimen includes amlodipine, carvedilol, isosorbide, hydralazine and torsemide.  Currently receiving carvedilol, furosemide, and hydralazine.  BP stable for this patient  6.  Chronic systolic heart failure.  EF from July 16, 2020 shows EF 30 to 35% with mild LVH and a grade 2 diastolic dysfunction.  Furosemide redosed nondialysis days only.   LOS: 6   12/20/202210:37 AM

## 2021-06-07 NOTE — TOC Transition Note (Addendum)
Transition of Care [facility]) - CM/SW Discharge Note   Patient Details  Name: Jimmy Olson MRN: [id number] Date of Birth: 05-Sep-1960  Transition of Care [facility]) CM/SW Contact:  Alberteen Sam, LCSW Phone Number: 06/07/2021, 10:14 AM   Clinical Narrative:     Update: [facility] has arranged vehicle to pick patient up to take home. RN made aware.   Patient to discharge home with [facility] PT and OT, patient is uninsured so referral sent to [place] with [place] for [employer] services.   Patient has walker at home and no DME needs.   Per HD coordinator patient is set up at outpatient HD facility.   Patient is current with [facility] for PCP services and gets his prescriptions from there.     No further dc needs identified at this time.    Final next level of care: [place] Barriers to Discharge: No Barriers Identified   Patient Goals and CMS Choice Patient states their goals for this hospitalization and ongoing recovery are:: to go home CMS Medicare.gov Compare Post Acute Care list provided to:: Patient Choice offered to / list presented to : Patient  Discharge Placement                    Patient and family notified of of transfer: 06/07/21  Discharge Plan and Services   Discharge Planning Services: CM Consult Post Acute Care Choice: Home Health          DME Arranged: N/A DME Agency: NA       HH Arranged: PT, OT HH Agency: [place] Date [place]: 06/07/21 Time [place]: 2111 Representative spoke with at [place]: [place]  Social Determinants of Health ([place]) Interventions     Readmission Risk Interventions Readmission Risk Prevention Plan 06/01/2021  Transportation Screening Complete  PCP or Specialist Appt within 3-5 Days Complete  HRI or [place] Complete  Social Work Consult for [place] Planning/Counseling [place] Screening Not Applicable   Medication Review Press photographer) Referral to Pharmacy  Some recent data might be hidden

## 2021-06-19 DIAGNOSIS — G459 Transient cerebral ischemic attack, unspecified: Secondary | ICD-10-CM

## 2021-06-19 HISTORY — DX: Transient cerebral ischemic attack, unspecified: G45.9

## 2021-06-21 NOTE — Progress Notes (Deleted)
Patient ID: Jimmy Olson, male    DOB: August 28, 1960, 61 y.o.   MRN: [id number]  HPI  [facility] interpreter present during entire visit.   Jimmy Olson is a 61 y/o male with a history of CAD, DM, HTN, CKD and chronic heart failure.   Echo report from 06/02/21 reviewed and showed an EF of 50-55% along with mild LVH and mild Jimmy. Echo report from 07/16/20 reviewed and showed an EF of 30-35%.  LHC done 06/03/21 showed: Prox LAD lesion is 40% stenosed.   Mid LAD lesion is 60% stenosed.   Prox RCA lesion is 20% stenosed.   Mid RCA lesion is 30% stenosed.   1st Diag lesion is 70% stenosed.   Dist RCA lesion is 30% stenosed.   RPDA lesion is 40% stenosed.   There is mild left ventricular systolic dysfunction.   LV end diastolic pressure is mildly elevated.   The left ventricular ejection fraction is 45-50% by visual estimate.  RHC/LCH done 07/19/20 showed: Prox RCA lesion is 20% stenosed. Mid RCA lesion is 30% stenosed. Dist RCA lesion is 50% stenosed with 50% stenosed side branch in RPAV. Prox LAD lesion is 40% stenosed. 1st Diag lesion is 70% stenosed. Mid LAD lesion is 60% stenosed.   1.  Moderately severely calcified coronary arteries with moderate two-vessel coronary artery disease involving the LAD and right coronary artery.  No evidence of critical stenosis. 2.  Right heart catheterization showed mildly elevated filling pressures, mild pulmonary hypertension and normal cardiac output. 3.  Left ventricular angiography was not performed due to chronic kidney disease.  EF was moderately reduced by echo.  Admitted 05/31/21 due to worsening shortness of breath. Cardiology, Nephrology and vascular surgeon consults obtained. Catheter placed and hemodialysis started. Initially placed on bipap but able to be weaned off. Troponins elevated due to HF. Discharged after 7 days. Admitted 12/23/20 due to hyperkalemia due to stage IV CKD. Treated with veltassa. Nephrology consult completed. Discharged the  following day.   He presents today for a follow-up visit with a chief complaint of   Past Medical History:  Diagnosis Date   Chronic HFrEF (heart failure with reduced ejection fraction) ([place])    a. 06/2020 Echo: EF 30-35%, glob HK, GrII DD, nl RV fxn. Mild LAE.   Chronic kidney disease (CKD), stage IV (severe) ([place])    COVID-19 virus infection 02/2020   Diabetes mellitus without complication (HCC)    Hypertension    NICM (nonischemic cardiomyopathy) ([place])    a. 07/2009 MV: EF 48%; b. 08/2019 Echo: EF 45-50%. Global HK. Mod LVH. GrIDD; c. 06/2020 Echo: EF 30-35%, glob HK, mild LVH, GrII DD, nl RV fxn, mild LAE; d. 06/2020 Cath: nonobs dzs.   Nonobstructive CAD (coronary artery disease)    a. 07/2009 MV: EF 48%. No ischemia; b. 06/2020 Cath: LM min irregs, LAD 40p, 52m, D1 70, D2 min irregs, D3 nl, RI min irregs, LCX small, mild diff dzs, RCA 20p, 48m, 50d, RPAV 50-->Med Rx.   Past Surgical History:  Procedure Laterality Date   APPENDECTOMY     CARDIAC CATHETERIZATION     DIALYSIS/PERMA CATHETER INSERTION N/A 06/02/2021   Procedure: DIALYSIS/PERMA CATHETER INSERTION;  Surgeon: Algernon Huxley, MD;  Location: [place] [facility];  Service: Cardiovascular;  Laterality: N/A;   LEFT HEART CATH AND CORONARY ANGIOGRAPHY N/A 06/03/2021   Procedure: LEFT HEART CATH AND CORONARY ANGIOGRAPHY;  Surgeon: Wellington Hampshire, MD;  Location: [place] [facility];  Service: Cardiovascular;  Laterality:  N/A;   NASAL SINUS SURGERY     RIGHT/LEFT HEART CATH AND CORONARY ANGIOGRAPHY N/A 07/19/2020   Procedure: RIGHT/LEFT HEART CATH AND CORONARY ANGIOGRAPHY;  Surgeon: Wellington Hampshire, MD;  Location: [place] [facility];  Service: Cardiovascular;  Laterality: N/A;   Family History  Problem Relation Age of Onset   Kidney failure Mother        died @ 80   Heart failure Mother    Other Father        he never knew his father   Diabetes Brother    Social History   Tobacco Use   Smoking status: Never   Smokeless  tobacco: Never  Substance Use Topics   Alcohol use: Never   No Known Allergies   Review of Systems  Constitutional:  Positive for fatigue (easily). Negative for appetite change.  HENT:  Negative for congestion, postnasal drip, rhinorrhea and sore throat.   Eyes: Negative.   Respiratory:  Positive for shortness of breath (with moderate exertion). Negative for cough.   Cardiovascular:  Positive for chest pain (at times). Negative for palpitations and leg swelling.  Gastrointestinal:  Negative for abdominal distention and abdominal pain.  Endocrine: Negative.   Genitourinary: Negative.   Musculoskeletal:  Positive for back pain (upper back). Negative for neck pain.  Skin: Negative.   Allergic/Immunologic: Negative.   Neurological:  Negative for dizziness and light-headedness.  Hematological:  Negative for adenopathy. Does not bruise/bleed easily.  Psychiatric/Behavioral:  Positive for dysphoric mood and sleep disturbance (due to back pain). The patient is nervous/anxious (at times).      Physical Exam Vitals and nursing note reviewed. Exam conducted with a chaperone present (interpreter).  Constitutional:      Appearance: He is well-developed.  HENT:     Head: Normocephalic and atraumatic.  Neck:     Vascular: No JVD.  Cardiovascular:     Rate and Rhythm: Normal rate and regular rhythm.  Pulmonary:     Effort: Pulmonary effort is normal. No respiratory distress.     Breath sounds: No wheezing or rales.  Abdominal:     Palpations: Abdomen is soft.     Tenderness: There is no abdominal tenderness.  Musculoskeletal:     Cervical back: Neck supple.     Right lower leg: No tenderness. No edema.     Left lower leg: No tenderness. No edema.  Skin:    General: Skin is warm and dry.  Neurological:     General: No focal deficit present.     Mental Status: He is alert and oriented to person, place, and time.  Psychiatric:        Mood and Affect: Mood normal.        Behavior:  Behavior normal.   Assessment & Plan:  1: Chronic heart failure with now preserved ejection fraction along with LVH- - NYHA class III - euvolemic today - not weighing daily but does have scales; reminded to weigh daily and  call for an overnight weight gain of > 2 pounds or a weekly weight gain of > 5 pounds - weight up 156 pounds from last visit here 4 months ago - not adding salt to his food; reviewed the importance of closely following a 2000mg  sodium diet - saw cardiology Rockey Situ) 01/21/21 - BNP 05/31/21 was 1820.5  2: HTN w/ CKD with dialysis- - BP - BMP 06/05/21 reviewed and showed sodium 133, potassium 4.8, creatinine 7.05 and GFR 8 - sees PCP at [facility]  [place] - saw nephrology Candiss Norse) 05/26/21  3: DM- - A1c 06/01/21 was 8.7% - glucose at home this morning was   4: Depression- - continued to provide emotional support for patient regarding his stress with his son   Medication bottles reviewed.

## 2021-06-22 ENCOUNTER — Telehealth: Payer: Self-pay | Admitting: Family

## 2021-06-22 ENCOUNTER — Ambulatory Visit: Payer: Self-pay | Admitting: Family

## 2021-06-22 NOTE — Telephone Encounter (Signed)
Patient did not show for his Heart Failure Clinic appointment on 06/22/21. Will attempt to reschedule.

## 2021-06-24 ENCOUNTER — Other Ambulatory Visit: Payer: Self-pay

## 2021-06-27 ENCOUNTER — Other Ambulatory Visit: Payer: Self-pay

## 2021-06-28 ENCOUNTER — Other Ambulatory Visit: Payer: Self-pay

## 2021-06-29 ENCOUNTER — Other Ambulatory Visit: Payer: Self-pay

## 2021-07-07 ENCOUNTER — Other Ambulatory Visit: Payer: Self-pay

## 2021-07-11 ENCOUNTER — Other Ambulatory Visit: Payer: Self-pay

## 2021-07-11 MED ORDER — ISOSORBIDE MONONITRATE 20 MG PO TABS
ORAL_TABLET | ORAL | 1 refills | Status: DC
Start: 2021-07-11 — End: 2021-07-13

## 2021-07-11 MED ORDER — ROSUVASTATIN CALCIUM 40 MG PO TABS
40.0000 mg | ORAL_TABLET | Freq: Every day | ORAL | 1 refills | Status: DC
Start: 2021-07-11 — End: 2021-08-18
  Filled 2021-07-12: qty 30, 30d supply, fill #0

## 2021-07-11 MED ORDER — ELIQUIS 5 MG PO TABS
5.0000 mg | ORAL_TABLET | Freq: Two times a day (BID) | ORAL | 1 refills | Status: DC
  Filled 2021-07-12: qty 60, 30d supply, fill #0
  Filled 2021-08-18: qty 30, 15d supply, fill #1
  Filled 2021-09-05: qty 180, 90d supply, fill #2
  Filled 2021-09-05: qty 30, 15d supply, fill #2
  Filled 2022-01-18: qty 90, 45d supply, fill #0

## 2021-07-12 ENCOUNTER — Other Ambulatory Visit: Payer: Self-pay

## 2021-07-12 MED ORDER — PROAIR HFA 108 (90 BASE) MCG/ACT IN AERS
2.0000 | INHALATION_SPRAY | RESPIRATORY_TRACT | 5 refills | Status: DC | PRN
  Filled 2021-07-12 – 2021-07-13 (×2): qty 8.5, 17d supply, fill #0

## 2021-07-12 MED ORDER — ASPIRIN 81 MG PO TBEC
81.0000 mg | DELAYED_RELEASE_TABLET | Freq: Every day | ORAL | 5 refills | Status: DC
  Filled 2021-07-12: qty 30, 30d supply, fill #0

## 2021-07-12 MED ORDER — INSULIN DETEMIR 100 UNIT/ML FLEXPEN
20.0000 [IU] | PEN_INJECTOR | Freq: Every day | SUBCUTANEOUS | 5 refills | Status: DC
  Filled 2021-07-12: qty 15, 75d supply, fill #0
  Filled 2021-10-07: qty 30, 150d supply, fill #0

## 2021-07-12 MED ORDER — ISOSORBIDE MONONITRATE ER 30 MG PO TB24
30.0000 mg | ORAL_TABLET | Freq: Two times a day (BID) | ORAL | 5 refills | Status: DC
  Filled 2021-07-12: qty 60, 30d supply, fill #0

## 2021-07-12 MED ORDER — CARVEDILOL 25 MG PO TABS
25.0000 mg | ORAL_TABLET | Freq: Two times a day (BID) | ORAL | 5 refills | Status: DC
  Filled 2021-07-12: qty 60, 30d supply, fill #0

## 2021-07-12 MED ORDER — HYDRALAZINE HCL 100 MG PO TABS
100.0000 mg | ORAL_TABLET | Freq: Three times a day (TID) | ORAL | 5 refills | Status: DC
  Filled 2021-07-12: qty 90, 30d supply, fill #0

## 2021-07-13 ENCOUNTER — Ambulatory Visit: Payer: Self-pay

## 2021-07-13 ENCOUNTER — Other Ambulatory Visit: Payer: Self-pay

## 2021-07-13 MED ORDER — ISOSORBIDE DINITRATE 30 MG PO TABS: ORAL_TABLET | ORAL | 1 refills | Status: DC

## 2021-07-13 MED ORDER — ALBUTEROL SULFATE HFA 108 (90 BASE) MCG/ACT IN AERS
INHALATION_SPRAY | RESPIRATORY_TRACT | 10 refills | Status: DC
  Filled 2021-07-13: qty 6.7, 25d supply, fill #0
  Filled 2021-09-23: qty 6.7, 25d supply, fill #1
  Filled 2022-01-18: qty 6.7, 25d supply, fill #0

## 2021-07-14 ENCOUNTER — Other Ambulatory Visit: Payer: Self-pay

## 2021-07-14 ENCOUNTER — Observation Stay: Payer: Medicaid Other

## 2021-07-14 ENCOUNTER — Observation Stay
Admission: EM | Admit: 2021-07-14 | Discharge: 2021-07-15 | Disposition: A | Discharge status: Home / Self Care | Payer: Medicaid Other | Attending: Internal Medicine | Admitting: Internal Medicine

## 2021-07-14 ENCOUNTER — Emergency Department: Payer: Medicaid Other

## 2021-07-14 DIAGNOSIS — I2510 Atherosclerotic heart disease of native coronary artery without angina pectoris: Secondary | ICD-10-CM | POA: Insufficient documentation

## 2021-07-14 DIAGNOSIS — N186 End stage renal disease: Secondary | ICD-10-CM

## 2021-07-14 DIAGNOSIS — I5023 Acute on chronic systolic (congestive) heart failure: Secondary | ICD-10-CM | POA: Diagnosis not present

## 2021-07-14 DIAGNOSIS — R531 Weakness: Secondary | ICD-10-CM | POA: Diagnosis not present

## 2021-07-14 DIAGNOSIS — I82A19 Acute embolism and thrombosis of unspecified axillary vein: Secondary | ICD-10-CM

## 2021-07-14 DIAGNOSIS — E1165 Type 2 diabetes mellitus with hyperglycemia: Secondary | ICD-10-CM | POA: Diagnosis present

## 2021-07-14 DIAGNOSIS — R42 Dizziness and giddiness: Secondary | ICD-10-CM

## 2021-07-14 DIAGNOSIS — R519 Headache, unspecified: Secondary | ICD-10-CM | POA: Diagnosis present

## 2021-07-14 DIAGNOSIS — R202 Paresthesia of skin: Secondary | ICD-10-CM | POA: Diagnosis not present

## 2021-07-14 DIAGNOSIS — D631 Anemia in chronic kidney disease: Secondary | ICD-10-CM | POA: Diagnosis not present

## 2021-07-14 DIAGNOSIS — Z20822 Contact with and (suspected) exposure to covid-19: Secondary | ICD-10-CM | POA: Diagnosis not present

## 2021-07-14 DIAGNOSIS — I25118 Atherosclerotic heart disease of native coronary artery with other forms of angina pectoris: Secondary | ICD-10-CM | POA: Diagnosis present

## 2021-07-14 DIAGNOSIS — M898X9 Other specified disorders of bone, unspecified site: Secondary | ICD-10-CM | POA: Diagnosis not present

## 2021-07-14 DIAGNOSIS — I132 Hypertensive heart and chronic kidney disease with heart failure and with stage 5 chronic kidney disease, or end stage renal disease: Secondary | ICD-10-CM | POA: Insufficient documentation

## 2021-07-14 DIAGNOSIS — G459 Transient cerebral ischemic attack, unspecified: Secondary | ICD-10-CM | POA: Diagnosis not present

## 2021-07-14 DIAGNOSIS — Z86718 Personal history of other venous thrombosis and embolism: Secondary | ICD-10-CM

## 2021-07-14 DIAGNOSIS — E1122 Type 2 diabetes mellitus with diabetic chronic kidney disease: Secondary | ICD-10-CM | POA: Diagnosis not present

## 2021-07-14 DIAGNOSIS — I82A12 Acute embolism and thrombosis of left axillary vein: Secondary | ICD-10-CM | POA: Diagnosis not present

## 2021-07-14 DIAGNOSIS — I5020 Unspecified systolic (congestive) heart failure: Secondary | ICD-10-CM | POA: Diagnosis present

## 2021-07-14 DIAGNOSIS — Z992 Dependence on renal dialysis: Secondary | ICD-10-CM | POA: Diagnosis not present

## 2021-07-14 DIAGNOSIS — N189 Chronic kidney disease, unspecified: Secondary | ICD-10-CM

## 2021-07-14 DIAGNOSIS — Z8616 Personal history of COVID-19: Secondary | ICD-10-CM | POA: Diagnosis not present

## 2021-07-14 DIAGNOSIS — Z794 Long term (current) use of insulin: Secondary | ICD-10-CM | POA: Diagnosis not present

## 2021-07-14 DIAGNOSIS — Z79899 Other long term (current) drug therapy: Secondary | ICD-10-CM | POA: Diagnosis not present

## 2021-07-14 DIAGNOSIS — E785 Hyperlipidemia, unspecified: Secondary | ICD-10-CM | POA: Diagnosis present

## 2021-07-14 DIAGNOSIS — Z7901 Long term (current) use of anticoagulants: Secondary | ICD-10-CM

## 2021-07-14 DIAGNOSIS — R200 Anesthesia of skin: Secondary | ICD-10-CM

## 2021-07-14 DIAGNOSIS — Z7982 Long term (current) use of aspirin: Secondary | ICD-10-CM | POA: Diagnosis not present

## 2021-07-14 DIAGNOSIS — E1129 Type 2 diabetes mellitus with other diabetic kidney complication: Secondary | ICD-10-CM | POA: Diagnosis present

## 2021-07-14 DIAGNOSIS — Z862 Personal history of diseases of the blood and blood-forming organs and certain disorders involving the immune mechanism: Secondary | ICD-10-CM

## 2021-07-14 HISTORY — DX: Non-ST elevation (NSTEMI) myocardial infarction: I21.4

## 2021-07-14 HISTORY — DX: Other forms of acute ischemic heart disease: I24.8

## 2021-07-14 HISTORY — DX: Acute kidney failure with tubular necrosis: N17.0

## 2021-07-14 LAB — COMPREHENSIVE METABOLIC PANEL
ALT: 19 U/L (ref 0–44)
AST: 20 U/L (ref 15–41)
Albumin: 3.7 g/dL (ref 3.5–5.0)
Alkaline Phosphatase: 105 U/L (ref 38–126)
Anion gap: 10 (ref 5–15)
BUN: 31 mg/dL — ABNORMAL HIGH (ref 6–20)
CO2: 23 mmol/L (ref 22–32)
Calcium: 9.7 mg/dL (ref 8.9–10.3)
Chloride: 102 mmol/L (ref 98–111)
Creatinine, Ser: 3.05 mg/dL — ABNORMAL HIGH (ref 0.61–1.24)
GFR, Estimated: 23 mL/min — ABNORMAL LOW (ref >60)
Glucose, Bld: 164 mg/dL — ABNORMAL HIGH (ref 70–99)
Potassium: 4.2 mmol/L (ref 3.5–5.1)
Sodium: 135 mmol/L (ref 135–145)
Total Bilirubin: 0.8 mg/dL (ref 0.3–1.2)
Total Protein: 7 g/dL (ref 6.5–8.1)

## 2021-07-14 LAB — ETHANOL: Alcohol, Ethyl (B): <10 mg/dL (ref <10)

## 2021-07-14 LAB — CBC
HCT: 28.2 % — ABNORMAL LOW (ref 39.0–52.0)
Hemoglobin: 9.6 g/dL — ABNORMAL LOW (ref 13.0–17.0)
MCH: 30.4 pg (ref 26.0–34.0)
MCHC: 34 g/dL (ref 30.0–36.0)
MCV: 89.2 fL (ref 80.0–100.0)
Platelets: 217 10*3/uL (ref 150–400)
RBC: 3.16 MIL/uL — ABNORMAL LOW (ref 4.22–5.81)
RDW: 12 % (ref 11.5–15.5)
WBC: 7.3 10*3/uL (ref 4.0–10.5)
nRBC: 0 % (ref 0.0–0.2)

## 2021-07-14 LAB — LIPID PANEL
Cholesterol: 167 mg/dL (ref 0–200)
HDL: 40 mg/dL — ABNORMAL LOW (ref >40)
LDL Cholesterol: 96 mg/dL (ref 0–99)
Total CHOL/HDL Ratio: 4.2 RATIO
Triglycerides: 155 mg/dL — ABNORMAL HIGH (ref <150)
VLDL: 31 mg/dL (ref 0–40)

## 2021-07-14 LAB — HEPATITIS B SURFACE ANTIBODY,QUALITATIVE: Hep B S Ab: NONREACTIVE

## 2021-07-14 LAB — DIFFERENTIAL
Abs Immature Granulocytes: 0.04 10*3/uL (ref 0.00–0.07)
Basophils Absolute: 0 10*3/uL (ref 0.0–0.1)
Basophils Relative: 1 %
Eosinophils Absolute: 0.1 10*3/uL (ref 0.0–0.5)
Eosinophils Relative: 2 %
Immature Granulocytes: 1 %
Lymphocytes Relative: 13 %
Lymphs Abs: 1 10*3/uL (ref 0.7–4.0)
Monocytes Absolute: 0.5 10*3/uL (ref 0.1–1.0)
Monocytes Relative: 7 %
Neutro Abs: 5.6 10*3/uL (ref 1.7–7.7)
Neutrophils Relative %: 76 %

## 2021-07-14 LAB — CBG MONITORING, ED: Glucose-Capillary: 171 mg/dL — ABNORMAL HIGH (ref 70–99)

## 2021-07-14 LAB — PROTIME-INR
INR: 1 (ref 0.8–1.2)
Prothrombin Time: 13.6 seconds (ref 11.4–15.2)

## 2021-07-14 LAB — APTT: aPTT: 28 seconds (ref 24–36)

## 2021-07-14 LAB — RESP PANEL BY RT-PCR (FLU A&B, COVID) ARPGX2
Influenza A by PCR: NEGATIVE
Influenza B by PCR: NEGATIVE
SARS Coronavirus 2 by RT PCR: NEGATIVE

## 2021-07-14 LAB — GLUCOSE, CAPILLARY: Glucose-Capillary: 162 mg/dL — ABNORMAL HIGH (ref 70–99)

## 2021-07-14 LAB — HEPATITIS B SURFACE ANTIGEN: Hepatitis B Surface Ag: NONREACTIVE

## 2021-07-14 MED ORDER — ACETAMINOPHEN 160 MG/5ML PO SOLN
650.0000 mg | ORAL | Status: DC | PRN
  Filled 2021-07-14: qty 20.3

## 2021-07-14 MED ORDER — INSULIN ASPART 100 UNIT/ML IJ SOLN: 0.0000 [IU] | Freq: Every day | INTRAMUSCULAR | Status: DC

## 2021-07-14 MED ORDER — LORAZEPAM 2 MG/ML IJ SOLN
INTRAMUSCULAR | Status: AC
  Filled 2021-07-14: qty 1

## 2021-07-14 MED ORDER — DIAZEPAM 5 MG PO TABS
5.0000 mg | ORAL_TABLET | Freq: Once | ORAL | Status: AC
  Administered 2021-07-14: 5 mg via ORAL
  Filled 2021-07-14: qty 1

## 2021-07-14 MED ORDER — ACETAMINOPHEN 650 MG RE SUPP: 650.0000 mg | RECTAL | Status: DC | PRN

## 2021-07-14 MED ORDER — ASPIRIN EC 81 MG PO TBEC
81.0000 mg | DELAYED_RELEASE_TABLET | Freq: Every day | ORAL | Status: DC
  Administered 2021-07-15: 81 mg via ORAL
  Filled 2021-07-14: qty 1

## 2021-07-14 MED ORDER — ALTEPLASE 2 MG IJ SOLR
2.0000 mg | Freq: Once | INTRAMUSCULAR | Status: DC | PRN
  Filled 2021-07-14: qty 2

## 2021-07-14 MED ORDER — MORPHINE SULFATE (PF) 4 MG/ML IV SOLN
4.0000 mg | Freq: Once | INTRAVENOUS | Status: AC
  Administered 2021-07-14: 4 mg via INTRAVENOUS
  Filled 2021-07-14: qty 1

## 2021-07-14 MED ORDER — ROSUVASTATIN CALCIUM 20 MG PO TABS
40.0000 mg | ORAL_TABLET | Freq: Every day | ORAL | Status: DC
  Administered 2021-07-14: 40 mg via ORAL
  Filled 2021-07-14: qty 2

## 2021-07-14 MED ORDER — STROKE: EARLY STAGES OF RECOVERY BOOK: Freq: Once | Status: AC

## 2021-07-14 MED ORDER — INSULIN DETEMIR 100 UNIT/ML ~~LOC~~ SOLN
10.0000 [IU] | Freq: Every day | SUBCUTANEOUS | Status: DC
  Filled 2021-07-14 (×2): qty 0.1

## 2021-07-14 MED ORDER — PENTAFLUOROPROP-TETRAFLUOROETH EX AERO
1.0000 "application " | INHALATION_SPRAY | CUTANEOUS | Status: DC | PRN
  Filled 2021-07-14: qty 30

## 2021-07-14 MED ORDER — HEPARIN SODIUM (PORCINE) 1000 UNIT/ML DIALYSIS
1000.0000 [IU] | INTRAMUSCULAR | Status: DC | PRN
Start: 2021-07-14 — End: 2021-07-15
  Filled 2021-07-14: qty 1

## 2021-07-14 MED ORDER — SODIUM CHLORIDE 0.9 % IV SOLN: 100.0000 mL | INTRAVENOUS | Status: DC | PRN

## 2021-07-14 MED ORDER — MORPHINE SULFATE (PF) 2 MG/ML IV SOLN
2.0000 mg | Freq: Once | INTRAVENOUS | Status: AC
  Administered 2021-07-14: 2 mg via INTRAVENOUS
  Filled 2021-07-14: qty 1

## 2021-07-14 MED ORDER — ONDANSETRON HCL 4 MG/2ML IJ SOLN: 4.0000 mg | Freq: Once | INTRAMUSCULAR | Status: DC

## 2021-07-14 MED ORDER — LIDOCAINE HCL (PF) 1 % IJ SOLN
5.0000 mL | INTRAMUSCULAR | Status: DC | PRN
  Filled 2021-07-14: qty 5

## 2021-07-14 MED ORDER — ONDANSETRON HCL 4 MG/2ML IJ SOLN
4.0000 mg | Freq: Once | INTRAMUSCULAR | Status: AC
  Administered 2021-07-14: 4 mg via INTRAVENOUS
  Filled 2021-07-14: qty 2

## 2021-07-14 MED ORDER — LABETALOL HCL 5 MG/ML IV SOLN
10.0000 mg | Freq: Once | INTRAVENOUS | Status: AC
  Administered 2021-07-14: 22:00:00 10 mg via INTRAVENOUS
  Filled 2021-07-14: qty 4

## 2021-07-14 MED ORDER — INSULIN ASPART 100 UNIT/ML IJ SOLN: 0.0000 [IU] | Freq: Three times a day (TID) | INTRAMUSCULAR | Status: DC

## 2021-07-14 MED ORDER — CHLORHEXIDINE GLUCONATE CLOTH 2 % EX PADS
6.0000 | MEDICATED_PAD | Freq: Every day | CUTANEOUS | Status: DC
  Administered 2021-07-15: 6 via TOPICAL
  Filled 2021-07-14: qty 6

## 2021-07-14 MED ORDER — APIXABAN 5 MG PO TABS
5.0000 mg | ORAL_TABLET | Freq: Two times a day (BID) | ORAL | Status: DC
  Administered 2021-07-14 – 2021-07-15 (×2): 5 mg via ORAL
  Filled 2021-07-14 (×2): qty 1

## 2021-07-14 MED ORDER — SODIUM CHLORIDE 0.9% FLUSH
3.0000 mL | Freq: Once | INTRAVENOUS | Status: AC
  Administered 2021-07-14: 3 mL via INTRAVENOUS

## 2021-07-14 MED ORDER — LIDOCAINE-PRILOCAINE 2.5-2.5 % EX CREA
1.0000 "application " | TOPICAL_CREAM | CUTANEOUS | Status: DC | PRN
  Filled 2021-07-14: qty 5

## 2021-07-14 MED ORDER — ACETAMINOPHEN 325 MG PO TABS: 650.0000 mg | ORAL_TABLET | ORAL | Status: DC | PRN

## 2021-07-14 NOTE — Progress Notes (Signed)
°   07/14/21 1600  Clinical Encounter Type  Visited With Patient not available  Referral From Nurse  Consult/Referral To Chaplain   Chaplain responded to code stroke. Patient went to MRI and unavailable. Code stroke cancelled. Will follow up after further testing.

## 2021-07-14 NOTE — Assessment & Plan Note (Signed)
Continue Crestor. Check lipid panel.  

## 2021-07-14 NOTE — ED Notes (Signed)
Resumed care from [place].  Pt alert. Pt has a headache.  Family with pt.

## 2021-07-14 NOTE — Progress Notes (Addendum)
Patient went down earlier for MRI but was unable to complete due to pain.   After giving medication for pain and hypertension, Patient was reassessed, noted to be sleeping comfortably in bed. I had to wake him to recheck vitals and asked if his pain was better. Patient said, "yes much better".   I reminded patient that he still needs to get his MRI and that it's very important, so since he was feeling better I would see if they could do it now. Patient refused, "I don't want MRI right now, maybe later". Educated on the importance of the imaging and that since his pain was no longer an issue it would be best time to do it. Patient still refused, provider on call notified of refusal.  Update @2336 - Pt agreed to do MRI if we gave him something to help him relax due to anxiety about MRI.

## 2021-07-14 NOTE — Subjective & Objective (Addendum)
CC: headache HPI: 61 year old Hispanic male with a history of type 2 diabetes, chronic systolic heart failure EF of 50%, end-stage renal disease, coronary artery disease, hyperlipidemia, severe depression presents to the ER today from community clinic.  Patient noted to have a severe headache around 11 AM..  Patient arrived by EMS.  Patient was seen in clinic today with left arm numbness and dizziness.  Unable to stand without assistance.  Patient has a history of a known DVT.  Is on Eliquis.  Code stroke was called.  Teleneurology seen and assessed the patient.  Code stroke was canceled.  CT head was read as negative.  MRI of the brain and MRA of head neck have been ordered.  Patient continues have a headache.  IV morphine has been ordered.  [facility] hospitalist contacted for admission.

## 2021-07-14 NOTE — ED Provider Notes (Signed)
[facility] Provider Note    Event Date/Time   First MD Initiated Contact with Patient 07/14/21 1317     (approximate)  History   Chief Complaint: Code Stroke  HPI  Jimmy Olson is a 61 y.o. male with a past medical history of CHF, ESRD on HD, cardiomyopathy, presents to the emergency department for a significant headache and possible left upper extremity numbness.  According to report [facility] with a significant headache and left upper extremity numbness started around 11:30 AM.  Patient was made a code stroke upon arrival to the emergency department and brought directly to the [facility].  I have reviewed the CT images do not see any acute bleed.  Patient is Spanish-speaking even with the interpreter patient does not answer most questions, will follow some commands at times.  The commands he was able to follow did not show any definitive deficits.  Physical Exam   Triage Vital Signs: ED Triage Vitals [07/14/21 1311]  Enc Vitals Group     BP (!) 171/105     Pulse Rate 97     Resp 20     Temp      Temp src      SpO2 99 %     Weight      Height      Head Circumference      Peak Flow      Pain Score      Pain Loc      Pain Edu?      Excl. in [place]?     Most recent vital signs: Vitals:   07/14/21 1311  BP: (!) 171/105  Pulse: 97  Resp: 20  SpO2: 99%    General: Patient is awake, moaning and moving around on the CT table.  However after a conversation patient was able to remain still for the CT. CV:  Good peripheral perfusion.  Regular rate and rhythm  Resp:  Normal effort.  Equal breath sounds bilaterally.  Abd:  No distention.  Soft, nontender.  No rebound or guarding. Other:  Equal grip strength bilaterally.  No apparent pronator drift.  No obvious cranial nerve deficits.  Appears to move all extremities well  EKG viewed and interpreted by myself shows a normal sinus rhythm at 91 bpm with a narrow QRS,  normal axis, normal intervals besides slight QTC prolongation, nonspecific ST changes with no ST elevation.  RADIOLOGY  I have personally reviewed the CT images, no acute finding on my evaluation. CT head read as negative by radiology   MEDICATIONS ORDERED IN ED: Medications  sodium chloride flush (NS) 0.9 % injection 3 mL (has no administration in time range)  LORazepam (ATIVAN) 2 MG/ML injection (has no administration in time range)     IMPRESSION / MDM / ASSESSMENT AND PLAN / ED COURSE  I reviewed the triage vital signs and the nursing notes.  Patient presents to the emergency department from the clinic for severe headache and left upper extremity numbness.  Here the patient appears to have a headache is moaning holding his head at times.  Even with the interpreter patient is not answering most questions, will just repeat himself but not answer the question.  Patient made a code stroke upon arrival due to the symptoms occurring at 11:30 AM.  However on my examination patient does not appear to have any obvious neurological deficit.  Somewhat difficult to get an accurate neurological exam given  the patient's lack of attention 1 following commands but patient does have good grip strength bilaterally no pronator drift bilaterally no apparent cranial nerve deficits.  Moves both lower extremities at times appears to have good strength in all extremities.  I have reviewed the CT images the head no acute bleed on my evaluation awaiting radiology read.  Neurology is currently seeing the patient as well.  CT head appears negative.  No significant finding on initial lab work.  Creatinine of 3.0 however the patient is on dialysis.  COVID and flu are negative.  MRI is pending.  Patient care signed out to Dr. Quentin Cornwall.  FINAL CLINICAL IMPRESSION(S) / ED DIAGNOSES   Headache   Note:  This document was prepared using Dragon voice recognition software and may include unintentional dictation errors.    Harvest Dark, MD 07/15/21 [phone]

## 2021-07-14 NOTE — Assessment & Plan Note (Signed)
-   Continue Eliquis 

## 2021-07-14 NOTE — Assessment & Plan Note (Signed)
Chronic.  EF of 50%.

## 2021-07-14 NOTE — Consult Note (Signed)
NEURO HOSPITALIST CONSULT NOTE   Requestig physician: Dr. Kerman Passey  Reason for Consult: Left sided weakness  History obtained from:  Patient and Chart     HPI:                                                                                                                                          Jimmy Olson is an 61 y.o. male with a history of DVT (on Eliquis), CAD (on ASA and rosuvastatin), NICM, chronic HFrEF (EF 30-35% on Jan 2022 echo), ESRD on HD, recent permacath insertion, DM, HTN, who presented to the ED via EMS from [facility] [facility], with worst HA ever, left arm numbness, dizziness and inability to stand without assistance beginning at approximately 11:30 AM today. Code Stroke was called after arrival to the ED.   Vitals on arrival:  BP 200/68, 186/106, 190/108 HR 98-106 99% RA CBG 278  ROS: Indicates that he has a bifrontal and bitemporal headache by gesturing. Left side is with sensory numbness involving entirety of LUE as well to a lesser extent as his LLE. Has weakness subjectively of LUE and LLE. Has blurry vision. Endorses some trouble speaking. Positive for CP and SOB. No abdominal pain. No pain with urination. Positive for nausea. Other ROS as per HPI.   Past Medical History:  Diagnosis Date   Chronic HFrEF (heart failure with reduced ejection fraction) ([place])    a. 06/2020 Echo: EF 30-35%, glob HK, GrII DD, nl RV fxn. Mild LAE.   Chronic kidney disease (CKD), stage IV (severe) ([place])    COVID-19 virus infection 02/2020   Diabetes mellitus without complication ([facility])    Hypertension    NICM (nonischemic cardiomyopathy) ([place])    a. 07/2009 MV: EF 48%; b. 08/2019 Echo: EF 45-50%. Global HK. Mod LVH. GrIDD; c. 06/2020 Echo: EF 30-35%, glob HK, mild LVH, GrII DD, nl RV fxn, mild LAE; d. 06/2020 Cath: nonobs dzs.   Nonobstructive CAD (coronary artery disease)    a. 07/2009 MV: EF 48%. No ischemia; b. 06/2020 Cath: LM min irregs, LAD 40p,  62m, D1 70, D2 min irregs, D3 nl, RI min irregs, LCX small, mild diff dzs, RCA 20p, 47m, 50d, RPAV 50-->Med Rx.    Past Surgical History:  Procedure Laterality Date   APPENDECTOMY     CARDIAC CATHETERIZATION     DIALYSIS/PERMA CATHETER INSERTION N/A 06/02/2021   Procedure: DIALYSIS/PERMA CATHETER INSERTION;  Surgeon: Algernon Huxley, MD;  Location: [facility];  Service: Cardiovascular;  Laterality: N/A;   LEFT HEART CATH AND CORONARY ANGIOGRAPHY N/A 06/03/2021   Procedure: LEFT HEART CATH AND CORONARY ANGIOGRAPHY;  Surgeon: Wellington Hampshire, MD;  Location: [facility];  Service: Cardiovascular;  Laterality: N/A;   NASAL SINUS  SURGERY     RIGHT/LEFT HEART CATH AND CORONARY ANGIOGRAPHY N/A 07/19/2020   Procedure: RIGHT/LEFT HEART CATH AND CORONARY ANGIOGRAPHY;  Surgeon: Wellington Hampshire, MD;  Location: [facility];  Service: Cardiovascular;  Laterality: N/A;    Family History  Problem Relation Age of Onset   Kidney failure Mother        died @ 22   Heart failure Mother    Other Father        he never knew his father   Diabetes Brother              Social History:  reports that he has never smoked. He has never used smokeless tobacco. He reports that he does not drink alcohol and does not use drugs.  No Known Allergies  HOME MEDICATIONS:                                                                                                                     No current facility-administered medications on file prior to encounter.   Current Outpatient Medications on File Prior to Encounter  Medication Sig Dispense Refill   acetaminophen (TYLENOL) 325 MG tablet Take 325 mg by mouth every 6 (six) hours as needed.     albuterol (PROVENTIL HFA) 108 (90 Base) MCG/ACT inhaler INHALE 2 PUFFS INTO THE LUNGS ONCE EVERY 6 HOURS AS NEEDED FOR WHEEZING OR SHORTNESS OF BREATH. 6.7 g 10   apixaban (ELIQUIS) 5 MG TABS tablet TAKE 1 TABLET BY MOUTH TWICE DAILY. 180 tablet 1    aspirin (ASPIRIN ADULT LOW STRENGTH) 81 MG EC tablet Take 1 tablet (81 mg total) by mouth daily. 30 tablet 5   carvedilol (COREG) 25 MG tablet Take 1 tablet (25 mg total) by mouth 2 (two) times daily. 60 tablet 5   cetirizine (ZYRTEC) 10 MG tablet Take 10 mg by mouth daily as needed for allergies.     D3-50 1.25 MG (50000 UT) capsule Take 50,000 Units by mouth once a week.     gabapentin (NEURONTIN) 300 MG capsule Take 1 capsule (300 mg total) by mouth at bedtime for 7 days. 7 capsule 0   hydrALAZINE (APRESOLINE) 100 MG tablet Take 1 tablet (100 mg total) by mouth once every 8 (eight) hours. 90 tablet 5   insulin detemir (LEVEMIR FLEXTOUCH) 100 UNIT/ML FlexPen Inject 20 Units into the skin once daily at bedtime. 15 mL 5   Insulin Pen Needle 32G X 4 MM MISC USE AS DIRECTED 100 each 0   isosorbide mononitrate (IMDUR) 30 MG 24 hr tablet Take 1 tablet (30 mg total) by mouth in the morning and at bedtime. 60 tablet 5   nitroGLYCERIN (NITROSTAT) 0.4 MG SL tablet Place 1 tablet (0.4 mg total) under the tongue every 5 (five) minutes as needed for chest pain. 30 tablet 0   rosuvastatin (CRESTOR) 40 MG tablet TAKE 1 TABLET BY MOUTH ONCE DAILY. 90 tablet 1   Vitamin D, Ergocalciferol, (DRISDOL) 1.25 MG (50000 UNIT) CAPS  capsule Take 50,000 Units by mouth every 7 (seven) days.     [DISCONTINUED] atorvastatin (LIPITOR) 80 MG tablet Take 1 tablet (80 mg total) by mouth daily. 30 tablet 0   [DISCONTINUED] isosorbide dinitrate (ISORDIL) 30 MG tablet TAKE 1 TABLET BY MOUTH ONCE DAILY. 90 tablet 1      ROS:                                                                                                                                       As per HPI. Denies any other symptoms.   Blood pressure (!) 171/105, pulse 97, resp. rate 20, SpO2 99 %.   General Examination:                                                                                                       Physical Exam  HEENT-  [place]/AT    Chest:  Right permacath in place Lungs- Strained quality to breathing while in apparent pain, but no gross wheezing audible. Extremities- No edema   Neurological Examination Mental Status: Awake with pained affect. Speech is sparse in the context of pain, but fluent and nondysarthric. Orientation for basic questions intact. Able to demonstrate comprehension of all questions and commands, with some requiring repetition in the context of decreased attention in the setting of pain.  Cranial Nerves: II: Temporal visual fields intact with no extinction to DSS. PERRL.   III,IV, VI: No ptosis. EOMI without nystagmus.   V: Temp sensation equal bilaterally VII: Grimace is symmetric VIII: Hearing intact to voice IX,X: Palate rises symmetrically XI: Symmetric XII: Midline tongue extension Motor: Giveway weakness and bobbing drift of all 4 limbs that improves with coaching. Maximum strength elicitable is 4+/5 BUE proximally and distally without asymmetry, and 4+/5 BLE without asymmetry.  Sensory: Subjective hyperesthesia to temp LUE. Subjective normal temp sensation to BLE. FT intact x 4. No extinction to DSS.  Deep Tendon Reflexes: 2+ and symmetric throughout Plantars: Right: downgoing   Left: downgoing Cerebellar: No ataxia with finger to nose bilaterally, but with difficulty following commands in the context of pain and inattention Gait: Deferred   Lab Results: Basic Metabolic Panel: No results for input(s): NA, K, CL, CO2, GLUCOSE, BUN, CREATININE, CALCIUM, MG, PHOS in the last 168 hours.  CBC: No results for input(s): WBC, NEUTROABS, HGB, HCT, MCV, PLT in the last 168 hours.  Cardiac Enzymes: No results for input(s): CKTOTAL, CKMB, CKMBINDEX, TROPONINI in the last 168 hours.  Lipid Panel:  No results for input(s): CHOL, TRIG, HDL, CHOLHDL, VLDL, LDLCALC in the last 168 hours.  Imaging: No results found.   Assessment: 61 year old male with a history of DVT (on Eliquis), CAD (on ASA and  rosuvastatin), NICM, chronic HFrEF (EF 30-35% on Jan 2022 echo), ESRD on HD, recent permacath insertion, DM, HTN, who presented to the ED via EMS from [facility], with worst HA ever, left arm numbness, dizziness and inability to stand without assistance beginning at approximately 11:30 AM today. Code Stroke was called after arrival to the ED.   1. Exam is nonfocal except for subjective hyperesthesia to temp of LUE. Poor attention in the context of severe headache pain endorsed by the patient somewhat limits the evaluation.  2. CT head: There is no acute intracranial hemorrhage or evidence of acute infarction. ASPECT score is 10. 3. Overall presentation most likely secondary to acute holocephalic headache. No neck stiffness, leukocytosis or fever to suggest a meningitis. DDx for headache includes secondary to hypertensive urgency. If headache persists after normotension is achieved, then more likely to be secondary to acute tension type or migraine headache. Stroke unlikely.   Recommendations: 1. MRI brain with MRA of head and neck, without contrast (has ESRD) 2. Management of acute headache pain per ED protocols.  3. TTE 4. Cardiac telemetry 5. Continue Eliquis (indication is for DVT) 6. On ASA and rosuvastatin for CAD 7. BP management. Goal normotensive.    Electronically signed: Dr. Kerney Elbe 07/14/2021, 1:20 PM

## 2021-07-14 NOTE — Assessment & Plan Note (Signed)
Chronic. 

## 2021-07-14 NOTE — Assessment & Plan Note (Signed)
Is on aspirin and Eliquis.

## 2021-07-14 NOTE — ED Notes (Signed)
Pt to CT

## 2021-07-14 NOTE — Assessment & Plan Note (Signed)
Patient is on dialysis Monday Wednesday Friday.  Secure chat sent to on-call nephrology for routine dialysis tomorrow.  Wife states the patient went to dialysis yesterday.  He dialyzes through a tunneled hemodialysis catheter on the right side.

## 2021-07-14 NOTE — ED Notes (Signed)
Cbg 171

## 2021-07-14 NOTE — ED Notes (Signed)
Called code stroke to [place]

## 2021-07-14 NOTE — ED Triage Notes (Signed)
Pt comes into the ED via EMS from [facility], with worst HA ever, left arm numbness, dizziness, unable to stand without assistance at approximately 1130am today.  Pt has known DVT  200/68 186/106 190/108 #[id number] HR98-106 99%RA [id number]

## 2021-07-14 NOTE — ED Notes (Signed)
Pt presents to ED via EMS with c/o of "worst headache of his life." Wife states pt has HX of migraines but has never seen him like this. Pt keeps tossing and turning holding his neck grimacing in pain.   Initial stroke scale by this RN was 0. Pt is able to answer all questions correctly and follows commands correctly. Pt denies any recent alcohol or drug use. Pt is hypertensive on arrival, wife states HX of this. Pt is currently A&Ox4.

## 2021-07-14 NOTE — H&P (Addendum)
History and Physical    Jimmy Olson ZOX:[id number] DOB: [birth year] DOA: 07/14/2021  PCP: [facility] [place]   Patient coming from: Clinic  I have personally briefly reviewed patient's old medical records in [place]  CC: headache HPI: 61 year old Hispanic male with a history of type 2 diabetes, chronic systolic heart failure EF of 50%, end-stage renal disease, coronary artery disease, hyperlipidemia, severe depression presents to the ER today from community clinic.  Patient noted to have a severe headache around 11 AM..  Patient arrived by EMS.  Patient was seen in clinic today with left arm numbness and dizziness.  Unable to stand without assistance.  Patient has a history of a known DVT.  Is on Eliquis.  Code stroke was called.  Teleneurology seen and assessed the patient.  Code stroke was canceled.  CT head was read as negative.  MRI of the brain and MRA of head neck have been ordered.  Patient continues have a headache.  IV morphine has been ordered.  [facility] hospitalist contacted for admission.   ED Course: Code stroke called by EDP.  Patient seen by teleneurology.  CT head was negative.  tPA was not given due to the patient being on systemic anticoagulation at the time of incident.  MRI of the brain and MRA of the brain and neck were ordered.  Review of Systems:  Review of Systems  Constitutional: Negative.  Negative for chills, fever and weight loss.  HENT: Negative.  Negative for ear pain, hearing loss and tinnitus.   Eyes: Negative.  Negative for blurred vision and double vision.  Respiratory: Negative.  Negative for cough, hemoptysis and sputum production.   Cardiovascular: Negative.  Negative for chest pain and palpitations.  Gastrointestinal: Negative.  Negative for heartburn, nausea and vomiting.  Genitourinary: Negative.   Musculoskeletal: Negative.   Skin: Negative.   Neurological:  Positive for headaches.       Left sided arm weakness   Endo/Heme/Allergies: Negative.   Psychiatric/Behavioral: Negative.    All other systems reviewed and are negative.  Past Medical History:  Diagnosis Date   Acute on chronic HFrEF (heart failure with reduced ejection fraction) ([place]) 07/15/2020   ATN (acute tubular necrosis) (HCC)    Chronic HFrEF (heart failure with reduced ejection fraction) ([place])    a. 06/2020 Echo: EF 30-35%, glob HK, GrII DD, nl RV fxn. Mild LAE.   Chronic kidney disease (CKD), stage IV (severe) ([place])    COVID-19 virus infection 02/2020   Demand ischemia (HCC)    Diabetes mellitus without complication ([place])    Elevated troponin 07/15/2020   Hyperkalemia 12/23/2020   Hypertension    NICM (nonischemic cardiomyopathy) ([place])    a. 07/2009 MV: EF 48%; b. 08/2019 Echo: EF 45-50%. Global HK. Mod LVH. GrIDD; c. 06/2020 Echo: EF 30-35%, glob HK, mild LVH, GrII DD, nl RV fxn, mild LAE; d. 06/2020 Cath: nonobs dzs.   Nonobstructive CAD (coronary artery disease)    a. 07/2009 MV: EF 48%. No ischemia; b. 06/2020 Cath: LM min irregs, LAD 40p, 47m, D1 70, D2 min irregs, D3 nl, RI min irregs, LCX small, mild diff dzs, RCA 20p, 74m, 50d, RPAV 50-->Med Rx.   NSTEMI (non-ST elevated myocardial infarction) [facility])     Past Surgical History:  Procedure Laterality Date   APPENDECTOMY     CARDIAC CATHETERIZATION     DIALYSIS/PERMA CATHETER INSERTION N/A 06/02/2021   Procedure: DIALYSIS/PERMA CATHETER INSERTION;  Surgeon: Algernon Huxley, MD;  Location: [facility]  INVASIVE CV LAB;  Service: Cardiovascular;  Laterality: N/A;   LEFT HEART CATH AND CORONARY ANGIOGRAPHY N/A 06/03/2021   Procedure: LEFT HEART CATH AND CORONARY ANGIOGRAPHY;  Surgeon: Wellington Hampshire, MD;  Location: [place] CV LAB;  Service: Cardiovascular;  Laterality: N/A;   NASAL SINUS SURGERY     RIGHT/LEFT HEART CATH AND CORONARY ANGIOGRAPHY N/A 07/19/2020   Procedure: RIGHT/LEFT HEART CATH AND CORONARY ANGIOGRAPHY;  Surgeon: Wellington Hampshire, MD;  Location: [place] CV LAB;   Service: Cardiovascular;  Laterality: N/A;     reports that he has never smoked. He has never used smokeless tobacco. He reports that he does not drink alcohol and does not use drugs.  No Known Allergies  Family History  Problem Relation Age of Onset   Kidney failure Mother        died @ 70   Heart failure Mother    Other Father        he never knew his father   Diabetes Brother     Prior to Admission medications   Medication Sig Start Date End Date Taking? Authorizing Provider  acetaminophen (TYLENOL) 325 MG tablet Take 325 mg by mouth every 6 (six) hours as needed. 09/20/20   [provider]  albuterol (PROVENTIL HFA) 108 (90 Base) MCG/ACT inhaler INHALE 2 PUFFS INTO THE LUNGS ONCE EVERY 6 HOURS AS NEEDED FOR WHEEZING OR SHORTNESS OF BREATH. 07/13/21     apixaban (ELIQUIS) 5 MG TABS tablet TAKE 1 TABLET BY MOUTH TWICE DAILY. 07/11/21     aspirin (ASPIRIN ADULT LOW STRENGTH) 81 MG EC tablet Take 1 tablet (81 mg total) by mouth daily. 07/12/21     carvedilol (COREG) 25 MG tablet Take 1 tablet (25 mg total) by mouth 2 (two) times daily. 07/12/21 08/13/21    cetirizine (ZYRTEC) 10 MG tablet Take 10 mg by mouth daily as needed for allergies.    [provider]  D3-50 1.25 MG (50000 UT) capsule Take 50,000 Units by mouth once a week. 10/21/20   [provider]  gabapentin (NEURONTIN) 300 MG capsule Take 1 capsule (300 mg total) by mouth at bedtime for 7 days. 06/07/21 06/14/21  Sidney Ace, MD  hydrALAZINE (APRESOLINE) 100 MG tablet Take 1 tablet (100 mg total) by mouth once every 8 (eight) hours. 07/12/21 08/13/21    insulin detemir (LEVEMIR FLEXTOUCH) 100 UNIT/ML FlexPen Inject 20 Units into the skin once daily at bedtime. 07/12/21 08/11/21    Insulin Pen Needle 32G X 4 MM MISC USE AS DIRECTED 06/07/21   Ralene Muskrat B, MD  isosorbide mononitrate (IMDUR) 30 MG 24 hr tablet Take 1 tablet (30 mg total) by mouth in the morning and at bedtime. 07/12/21 08/13/21     nitroGLYCERIN (NITROSTAT) 0.4 MG SL tablet Place 1 tablet (0.4 mg total) under the tongue every 5 (five) minutes as needed for chest pain. 07/25/20   Jennye Boroughs, MD  rosuvastatin (CRESTOR) 40 MG tablet TAKE 1 TABLET BY MOUTH ONCE DAILY. 07/11/21     Vitamin D, Ergocalciferol, (DRISDOL) 1.25 MG (50000 UNIT) CAPS capsule Take 50,000 Units by mouth every 7 (seven) days.    [provider]  atorvastatin (LIPITOR) 80 MG tablet Take 1 tablet (80 mg total) by mouth daily. 06/07/21 07/11/21  Sidney Ace, MD  isosorbide dinitrate (ISORDIL) 30 MG tablet TAKE 1 TABLET BY MOUTH ONCE DAILY. 07/12/21 07/13/21      Physical Exam: Vitals:   07/14/21 1500 07/14/21 1515 07/14/21 1530 07/14/21  1552  BP: (!) 197/99  (!) 194/94   Pulse: 83 80 80   Resp: 19 20 15    Temp:    98.3 F (36.8 C)  TempSrc:    Oral  SpO2: 100% 100% 100%     Physical Exam Vitals and nursing note reviewed.  Constitutional:      General: He is not in acute distress.    Appearance: He is ill-appearing. He is not toxic-appearing or diaphoretic.     Comments: Appears uncomfortable and in pain.  HENT:     Head: Normocephalic and atraumatic.     Nose: Nose normal.  Eyes:     General: No scleral icterus. Cardiovascular:     Rate and Rhythm: Normal rate and regular rhythm.     Pulses: Normal pulses.  Pulmonary:     Effort: Pulmonary effort is normal. No respiratory distress.     Breath sounds: No wheezing or rales.  Abdominal:     General: Abdomen is flat. Bowel sounds are normal. There is no distension.     Tenderness: There is no abdominal tenderness. There is no guarding.  Musculoskeletal:     Right lower leg: No edema.     Left lower leg: No edema.  Skin:    General: Skin is warm and dry.     Capillary Refill: Capillary refill takes less than 2 seconds.  Neurological:     Mental Status: He is alert and oriented to person, place, and time.     Comments: ??left hand grip strength +4-5/5. Variable.      Labs on Admission: I have personally reviewed following labs and imaging studies  CBC: Recent Labs  Lab 07/14/21 1347  WBC 7.3  NEUTROABS 5.6  HGB 9.6*  HCT 28.2*  MCV 89.2  PLT 497   Basic Metabolic Panel: Recent Labs  Lab 07/14/21 1347  NA 135  K 4.2  CL 102  CO2 23  GLUCOSE 164*  BUN 31*  CREATININE 3.05*  CALCIUM 9.7   GFR: CrCl cannot be calculated (Unknown ideal weight.). Liver Function Tests: Recent Labs  Lab 07/14/21 1347  AST 20  ALT 19  ALKPHOS 105  BILITOT 0.8  PROT 7.0  ALBUMIN 3.7   No results for input(s): LIPASE, AMYLASE in the last 168 hours. No results for input(s): AMMONIA in the last 168 hours. Coagulation Profile: Recent Labs  Lab 07/14/21 1347  INR 1.0   Cardiac Enzymes: No results for input(s): CKTOTAL, CKMB, CKMBINDEX, TROPONINI in the last 168 hours. BNP (last 3 results) No results for input(s): PROBNP in the last 8760 hours. HbA1C: No results for input(s): HGBA1C in the last 72 hours. CBG: Recent Labs  Lab 07/14/21 1310  GLUCAP 171*   Lipid Profile: No results for input(s): CHOL, HDL, LDLCALC, TRIG, CHOLHDL, LDLDIRECT in the last 72 hours. Thyroid Function Tests: No results for input(s): TSH, T4TOTAL, FREET4, T3FREE, THYROIDAB in the last 72 hours. Anemia Panel: No results for input(s): VITAMINB12, FOLATE, FERRITIN, TIBC, IRON, RETICCTPCT in the last 72 hours. Urine analysis: No results found for: COLORURINE, APPEARANCEUR, LABSPEC, [place], GLUCOSEU, [place], BILIRUBINUR, KETONESUR, PROTEINUR, UROBILINOGEN, NITRITE, LEUKOCYTESUR  Radiological Exams on Admission: I have personally reviewed images CT HEAD CODE STROKE WO CONTRAST  Result Date: 07/14/2021 CLINICAL DATA:  Neuro deficit, acute, stroke suspected left arm numbness/HA/unstable Gait EXAM: CT HEAD WITHOUT CONTRAST TECHNIQUE: Contiguous axial images were obtained from the base of the skull through the vertex without intravenous contrast. RADIATION DOSE REDUCTION:  This exam was performed  according to the departmental dose-optimization program which includes automated exposure control, adjustment of the mA and/or kV according to patient size and/or use of iterative reconstruction technique. COMPARISON:  None. FINDINGS: Brain: No acute intracranial hemorrhage, mass effect, or edema. Gray-white differentiation is preserved. No extra-axial collection. Ventricles and sulci are normal in size and configuration. Vascular: No hyperdense vessel. There is mild intracranial atherosclerotic calcification at the skull base. Skull: Unremarkable. Sinuses/Orbits: No significant abnormality. Other: None. ASPECTS ([place] Stroke Program Early CT Score) - Ganglionic level infarction (caudate, lentiform nuclei, internal capsule, insula, M1-M3 cortex): 7 - Supraganglionic infarction (M4-M6 cortex): 3 Total score (0-10 with 10 being normal): 10 IMPRESSION: There is no acute intracranial hemorrhage or evidence of acute infarction. ASPECT score is 10. Electronically Signed   By: Macy Mis M.D.   On: 07/14/2021 13:36    EKG: I have personally reviewed EKG: NSR   Assessment/Plan Principal Problem:   TIA (transient ischemic attack) Active Problems:   Acute deep vein thrombosis (DVT) of axillary vein (HCC) - left. diagnosed 05-2021   HFrEF (heart failure with reduced ejection fraction) (HCC)   Type II diabetes mellitus with renal manifestations ([place])   HLD (hyperlipidemia)   Coronary artery disease involving native coronary artery of native heart without angina pectoris   History of anemia due to CKD   ESRD on hemodialysis ([place])    TIA (transient ischemic attack) Admit to medical telemetry bed.  Neurology has seen the patient and placed orders for MRI of the brain and MRA of the head and neck.  We will hold off on any further blood pressure medications until MRI has either ruled in or ruled out stroke.  May need to allow for permissive hypertension if he has had an ischemic  stroke.  No tPA given due to the patient being on systemic anticoagulation with Eliquis. Check echo. Check lipid panel. Continue asa and eliquis.  ESRD on hemodialysis [facility]) Patient is on dialysis Monday Wednesday Friday.  Secure chat sent to on-call nephrology for routine dialysis tomorrow.  Wife states the patient went to dialysis yesterday.  He dialyzes through a tunneled hemodialysis catheter on the right side.  HFrEF (heart failure with reduced ejection fraction) (HCC) Chronic.  EF of 50%.  Type II diabetes mellitus with renal manifestations (HCC) Sliding scale insulin in addition to Levemir.  HLD (hyperlipidemia) Continue Crestor.  Check lipid panel.  Coronary artery disease involving native coronary artery of native heart without angina pectoris Is on aspirin and Eliquis.  Acute deep vein thrombosis (DVT) of axillary vein (HCC) - left. diagnosed 05-2021 Continue Eliquis.  History of anemia due to CKD Chronic  DVT prophylaxis: Eliquis Code Status: Full Code Family Communication: discussed with pt's wife Ana at bedside  Disposition Plan: return home  Consults called: neurology  Admission status: Observation, Telemetry bed   Kristopher Oppenheim, DO [facility] 07/14/2021, 3:53 PM

## 2021-07-14 NOTE — ED Notes (Signed)
Interpreter requested to CT 1 and assisted with communication. Pt not compliant with CT, Dr Kerman Passey to CT 1 to assess.

## 2021-07-14 NOTE — ED Notes (Addendum)
Pt in CT. Neuro cart in room and button pressed. Angi on screen and provided with all needed information.

## 2021-07-14 NOTE — ED Notes (Signed)
Meds given for headache.  Family with pt.

## 2021-07-14 NOTE — ED Notes (Signed)
Informed RN bed assigned 

## 2021-07-14 NOTE — ED Notes (Signed)
Pt sleeping. 

## 2021-07-14 NOTE — Assessment & Plan Note (Addendum)
Admit to medical telemetry bed.  Neurology has seen the patient and placed orders for MRI of the brain and MRA of the head and neck.  We will hold off on any further blood pressure medications until MRI has either ruled in or ruled out stroke.  May need to allow for permissive hypertension if he has had an ischemic stroke.  No tPA given due to the patient being on systemic anticoagulation with Eliquis. Check echo. Check lipid panel. Continue asa and eliquis.

## 2021-07-14 NOTE — Assessment & Plan Note (Signed)
Sliding scale insulin in addition to Levemir.

## 2021-07-14 NOTE — Code Documentation (Signed)
Stroke Response Nurse Documentation Code Documentation  Jimmy Olson is a 61 y.o. male arriving to [facility] ED via [facility] on 07/14/2021. On Eliquis (apixaban) daily. Code stroke was activated by ED RN.   Patient from [facility] drew Clinic where he was LKW at 1130 and now complaining of left arm numbness and severe headache.   Stroke team at the bedside on patient arrival. Labs drawn and patient cleared for CT by Dr. Kerman Passey. Patient to CT with team. NIHSS 3, see documentation for details and code stroke times. CT head completed. Patient is not a candidate for IV Thrombolytic due to daily eliquis taken this am.   Bedside handoff with ED RN Elsie Amis.    Velta Addison Stroke Designer, fashion/clothing

## 2021-07-14 NOTE — ED Notes (Signed)
Pt resting quietly.  Siderails up x 2.

## 2021-07-15 ENCOUNTER — Observation Stay: Payer: Medicaid Other

## 2021-07-15 ENCOUNTER — Observation Stay (HOSPITAL_BASED_OUTPATIENT_CLINIC_OR_DEPARTMENT_OTHER)
Admit: 2021-07-15 | Discharge: 2021-07-15 | Disposition: A | Discharge status: Home / Self Care | Payer: Medicaid Other | Attending: Internal Medicine | Admitting: Internal Medicine

## 2021-07-15 DIAGNOSIS — G459 Transient cerebral ischemic attack, unspecified: Secondary | ICD-10-CM

## 2021-07-15 LAB — ECHOCARDIOGRAM COMPLETE
AR max vel: 2.48 cm2
AV Area VTI: 3.04 cm2
AV Area mean vel: 2.91 cm2
AV Mean grad: 2 mmHg
AV Peak grad: 5 mmHg
Ao pk vel: 1.12 m/s
Area-P 1/2: 7.82 cm2
Calc EF: 48.7 %
MV VTI: 3.07 cm2
S' Lateral: 4.16 cm
Single Plane A2C EF: 44.7 %
Single Plane A4C EF: 52 %

## 2021-07-15 LAB — GLUCOSE, CAPILLARY
Glucose-Capillary: 150 mg/dL — ABNORMAL HIGH (ref 70–99)
Glucose-Capillary: 160 mg/dL — ABNORMAL HIGH (ref 70–99)

## 2021-07-15 LAB — HEMOGLOBIN A1C
Hgb A1c MFr Bld: 10.1 % — ABNORMAL HIGH (ref 4.8–5.6)
Mean Plasma Glucose: 243 mg/dL

## 2021-07-15 MED ORDER — GADOBUTROL 1 MMOL/ML IV SOLN
6.0000 mL | Freq: Once | INTRAVENOUS | Status: AC | PRN
  Administered 2021-07-15: 6 mL via INTRAVENOUS

## 2021-07-15 MED ORDER — LORAZEPAM 2 MG/ML IJ SOLN
1.0000 mg | INTRAMUSCULAR | Status: AC
  Administered 2021-07-15: 1 mg via INTRAVENOUS
  Filled 2021-07-15: qty 1

## 2021-07-15 MED ORDER — HEPARIN SODIUM (PORCINE) 1000 UNIT/ML IJ SOLN
INTRAMUSCULAR | Status: AC
  Filled 2021-07-15: qty 10

## 2021-07-15 NOTE — Evaluation (Signed)
Physical Therapy Evaluation Patient Details Name: Jimmy Olson MRN: [id number] DOB: June 09, 1961 Today's Date: 07/15/2021  History of Present Illness  61 year old Hispanic male with a history of type 2 diabetes, chronic systolic heart failure EF of 50%, end-stage renal disease, coronary artery disease, hyperlipidemia, severe depression presents to the ER today from community clinic.  Patient noted to have a severe headache around 11 AM. Determined to not have acute CVA.   Clinical Impression  Patient received in recliner, sleeping. He is agreeable to PT assessment. Patient to go for dialysis shortly. He is min guard for sit to stand from recliner. Unsteady without UE support in standing. Ambulated 150 feet with +1 min assist for balance and safety. He will continue to benefit from skilled PT while here to improve functional independence and safety with mobility for return home. Recommending Outpatient PT at this time for continued balance training and education if patient has transportation for appointments.         Recommendations for follow up therapy are one component of a multi-disciplinary discharge planning process, led by the attending physician.  Recommendations may be updated based on patient status, additional functional criteria and insurance authorization.  Follow Up Recommendations Outpatient PT    Assistance Recommended at Discharge Frequent or constant Supervision/Assistance  Patient can return home with the following  A little help with walking and/or transfers;A little help with bathing/dressing/bathroom;Assistance with cooking/housework;Assist for transportation;Help with stairs or ramp for entrance    Equipment Recommendations Rolling walker (2 wheels)  Recommendations for Other Services       Functional Status Assessment Patient has had a recent decline in their functional status and demonstrates the ability to make significant improvements in function in a reasonable and  predictable amount of time.     Precautions / Restrictions Precautions Precautions: Fall Restrictions Weight Bearing Restrictions: No      Mobility  Bed Mobility Overal bed mobility: Modified Independent             General bed mobility comments: increased time and effort but no physical assist    Transfers Overall transfer level: Needs assistance Equipment used: None Transfers: Sit to/from Stand Sit to Stand: Min guard                Ambulation/Gait Ambulation/Gait assistance: Min assist Gait Distance (Feet): 150 Feet Assistive device: 1 person hand held assist Gait Pattern/deviations: Step-through pattern, Staggering left, Staggering right Gait velocity: decr     General Gait Details: patient unsteady with ambulation, requiring min assist for steadying without AD  Stairs            Wheelchair Mobility    Modified Rankin (Stroke Patients Only)       Balance Overall balance assessment: Needs assistance Sitting-balance support: Feet supported Sitting balance-Leahy Scale: Good     Standing balance support: Single extremity supported, During functional activity Standing balance-Leahy Scale: Poor Standing balance comment: min assist with dynamic mobility                             Pertinent Vitals/Pain Pain Assessment Pain Assessment: No/denies pain    Home Living Family/patient expects to be discharged to:: Private residence Living Arrangements: Spouse/significant other Available Help at Discharge: Family Type of Home: House Home Access: Stairs to enter Entrance Stairs-Rails: Psychiatric nurse of Steps: 3-4   Home Layout: One level Home Equipment: Conservation officer, nature (2 wheels) Additional Comments: has RW at home but does  not use. He states he would use a cane if he had one.    Prior Function Prior Level of Function : Needs assist       Physical Assist : Mobility (physical) Mobility (physical): Gait    Mobility Comments: Pt reports he does not use AD for ambulation. ADLs Comments: Ind in self care. His wife assists with IADL tasks. He does not drive.     Hand Dominance        Extremity/Trunk Assessment   Upper Extremity Assessment Upper Extremity Assessment: Generalized weakness    Lower Extremity Assessment Lower Extremity Assessment: Generalized weakness    Cervical / Trunk Assessment Cervical / Trunk Assessment: Normal  Communication   Communication: Prefers language other than English  Cognition Arousal/Alertness: Awake/alert Behavior During Therapy: WFL for tasks assessed/performed Overall Cognitive Status: Within Functional Limits for tasks assessed                                          General Comments      Exercises     Assessment/Plan    PT Assessment Patient needs continued PT services  PT Problem List Decreased mobility;Decreased balance;Decreased knowledge of use of DME;Decreased knowledge of precautions;Decreased safety awareness       PT Treatment Interventions Gait training;Therapeutic exercise;Balance training;Stair training;Functional mobility training;Therapeutic activities;Patient/family education;DME instruction    PT Goals (Current goals can be found in the Care Plan section)  Acute Rehab PT Goals Patient Stated Goal: none stated PT Goal Formulation: With patient Time For Goal Achievement: 07/21/21    Frequency Min 2X/week     Co-evaluation               AM-PAC PT "6 Clicks" Mobility  Outcome Measure Help needed turning from your back to your side while in a flat bed without using bedrails?: None Help needed moving from lying on your back to sitting on the side of a flat bed without using bedrails?: None Help needed moving to and from a bed to a chair (including a wheelchair)?: A Little Help needed standing up from a chair using your arms (e.g., wheelchair or bedside chair)?: A Little Help needed to walk  in hospital room?: A Lot Help needed climbing 3-5 steps with a railing? : A Lot 6 Click Score: 18    End of Session Equipment Utilized During Treatment: Gait belt Activity Tolerance: Patient tolerated treatment well Patient left: in bed;with call bell/phone within reach;with nursing/sitter in room (NT coming to transport patient to HD) Nurse Communication: Mobility status PT Visit Diagnosis: Unsteadiness on feet (R26.81);Difficulty in walking, not elsewhere classified (R26.2);Ataxic gait (R26.0)    Time: 2355-7322 PT Time Calculation (min) (ACUTE ONLY): 9 min   Charges:   PT Evaluation $PT Eval Moderate Complexity: 1 Mod          Iyannah Blake, PT, GCS 07/15/21,11:34 AM

## 2021-07-15 NOTE — Progress Notes (Signed)
SLP Cancellation Note  Patient Details Name: Jimmy Olson MRN: [id number] DOB: Nov 14, 1960   Cancelled treatment:       Reason Eval/Treat Not Completed: SLP screened, no needs identified, will sign off (chart reviewed; consulted NSG then met w/ pt w/ the Interpreter, [name]) Pt denied any difficulty swallowing and is currently on a regular diet; tolerates swallowing pills w/ water per NSG. Pt conversed in conversation (both [place] and [place]) w/out overt expressive/receptive deficits noted; pt denied any current speech-language deficits. Speech clear. Noted MRI results: "no acute finding".  No further skilled ST services indicated as pt appears at his baseline. Pt agreed. NSG to reconsult if any change in status while admitted.        [name], MS, CCC-SLP Speech Language Pathologist Rehab Services; [place] [phone] (ascom) [name] 07/15/2021, 8:43 AM

## 2021-07-15 NOTE — Progress Notes (Signed)
Imaging studies reviewed:  MRI brain: 1. No acute finding. 2. Small remote cortical infarcts in the left occipital lobe and left precentral gyrus.   Neck MRA:  Focal irregularity and narrowing at the proximal left ICA with 50-60% stenosis and outpouching which may reflect plaque ulceration.   Intracranial MRA:  Negative.  Most recent TTE, 06/02/21: 1. Left ventricular ejection fraction, by estimation, is 50 to 55%. The  left ventricle has low normal function. The left ventricle has no regional  wall motion abnormalities. There is mild left ventricular hypertrophy.  Left ventricular diastolic parameters are consistent with Grade I diastolic dysfunction (impaired relaxation).   2. Right ventricular systolic function is normal. The right ventricular  size is normal. Tricuspid regurgitation signal is inadequate for assessing  PA pressure.   3. The mitral valve is normal in structure. Mild mitral valve  regurgitation. No evidence of mitral stenosis.   4. The aortic valve was not well visualized. Aortic valve regurgitation  is not visualized. No aortic stenosis is present.   A/R: 61 year old male with a history of DVT (on Eliquis), CAD (on ASA and rosuvastatin), NICM, chronic HFrEF (EF 30-35% on Jan 2022 echo), ESRD on HD, recent permacath insertion, DM, HTN, who presented to the ED yesterday via EMS from [facility], with worst HA ever, left arm numbness, dizziness and inability to stand without assistance beginning at approximately 11:30 AM.  - No acute stroke on MRI. However, neck MRA suggests presence of ulcerated plaque in the proximal left ICA with 50-60% stenosis - Carotid ultrasound and Vascular Surgery consult are recommended, which can be obtained inpatient or outpatient. - Had a TTE within the last 2 months. No indication for a repeat study at this time.  - Continue Eliquis - Will need outpatient Neurology follow up for TIA.  - Neurohospitalist service  will sign off. Please call if there are additional questions.   Electronically signed: Dr. Kerney Elbe

## 2021-07-15 NOTE — Discharge Summary (Signed)
Physician Discharge Summary  Jimmy Olson GXQ:[id number] DOB: 04/10/1961 DOA: 07/14/2021  PCP[facility] date: 07/14/2021 Discharge date: 07/15/2021  Admitted From: home Disposition:  home  Recommendations for Outpatient Follow-up:  Follow up with PCP in 1-2 weeks Follow-up with vascular surgery Follow-up with neurology Please obtain BMP/CBC in one week Please follow up on the following pending results:  Home Health:no  Equipment/Devices: none  Discharge Condition: Stable Code Status:   Code Status: Full Code Diet recommendation:  Diet Order             Diet renal/carb modified with fluid restriction Diet-HS Snack? Nothing; Fluid restriction: 1200 mL Fluid; Room service appropriate? Yes; Fluid consistency: Thin  Diet effective now                    Brief/Interim Summary: 61 year old Hispanic male with a history of type 2 diabetes, chronic systolic heart failure EF of 50%, end-stage renal disease, coronary artery disease, hyperlipidemia, severe depression presents to the ER today from community clinic.  Patient noted to have a severe headache around 11 AM..  Patient arrived by EMS.Patient was seen in clinic today with left arm numbness and dizziness.  Unable to stand without assistance. Patient has a history of a known DVT.  Is on Eliquis. Code stroke was called.  Teleneurology seen and assessed the patient.  Code stroke was canceled.  CT head was read as negative. MRI of the brain and MRA of head neck have been ordered. Patient continues have a headache.  IV morphine has been ordered. Patient is admitted for further management.  After sedation underwent MRI brain, no acute stroke on MRI but neck MRA suggest presence of ulcerated plaque in the proximal left ICA with 50 to 60% stenosis.  Had TEE within the last 2 months.  Neurology has seen patient in follow-up advised to continue Eliquis likely it was a TIA episode.  Patient will need to  follow-up with vascular surgery and have carotid ultrasound as outpatient. He will be getting dialysis and will go home today  Discharge Diagnoses:   Severe headache along with left arm numbness and dizziness inability to stand without assistance: Work-up showed no acute stroke, likely TIA.  Continue Eliquis.  Vascular surgery and cardiology signed as outpatient advised.  Outpatient PT OT per PT. TTE was done recently and ordered on admission.  Will need outpatient visit follow-up for TIA  ESRD on HD MWF getting HD today prior to discharge.  As did catheter on right chest.  Chronic systolic CHF with EF 14% fluid management with dialysis Type 2 diabetes mellitus continue home insulin regimen Hyperlipidemia continue Crestor improving fairly stable CAD on aspirin and Eliquis Acute DVT of axillary vein since 12/22 and on Eliquis will continue the same Anemia of renal disease Metabolic bone disease   Consults: neurology  Subjective: Alert awake oriented resting comfortably no more headache this morning.  Looking forward to going home today.  Discharge Exam: Vitals:   07/15/21 1230 07/15/21 1245  BP: (!) 172/84 (!) 172/86  Pulse: 75 78  Resp:    Temp:    SpO2: 98%    General: Pt is alert, awake, not in acute distress Cardiovascular: RRR, S1/S2 +, no rubs, no gallops Respiratory: CTA bilaterally, no wheezing, no rhonchi Abdominal: Soft, NT, ND, bowel sounds + Extremities: no edema, no cyanosis  Discharge Instructions  Discharge Instructions     Ambulatory referral to Neurology   Complete by: As  directed    An appointment is requested in approximately: 2 weeks If not able to follow-up with local neurology please call the office for follow-up of TIA   Discharge instructions   Complete by: As directed    He will need to follow-up with the vascular surgeon number provided please call the office of Dr. Lucky Cowboy  Follow-up with neurology for hospital follow-up for TIA  Please call  call MD or return to ER for similar or worsening recurring problem that brought you to hospital or if any fever,nausea/vomiting,abdominal pain, uncontrolled pain, chest pain,  shortness of breath or any other alarming symptoms.  Please follow-up your doctor as instructed in a week time and call the office for appointment.  Please avoid alcohol, smoking, or any other illicit substance and maintain healthy habits including taking your regular medications as prescribed.  You were cared for by a hospitalist during your hospital stay. If you have any questions about your discharge medications or the care you received while you were in the hospital after you are discharged, you can call the unit and ask to speak with the hospitalist on call if the hospitalist that took care of you is not available.  Once you are discharged, your primary care physician will handle any further medical issues. Please note that NO REFILLS for any discharge medications will be authorized once you are discharged, as it is imperative that you return to your primary care physician (or establish a relationship with a primary care physician if you do not have one) for your aftercare needs so that they can reassess your need for medications and monitor your lab values   Increase activity slowly   Complete by: As directed    No wound care   Complete by: As directed       Allergies as of 07/15/2021   No Known Allergies      Medication List     TAKE these medications    acetaminophen 325 MG tablet Commonly known as: TYLENOL Take 325 mg by mouth every 6 (six) hours as needed.   albuterol 108 (90 Base) MCG/ACT inhaler Commonly known as: Proventil HFA INHALE 2 PULSACIONES EN LOS PULMONES UNA VEZ CADA [place]. (INHALE 2 PUFFS INTO THE LUNGS ONCE EVERY 6 HOURS AS NEEDED FOR WHEEZING OR SHORTNESS OF BREATH.)   Aspirin Adult Low Strength 81 MG EC tablet Generic drug:  aspirin Tome 1 tableta (81 mg en total) por va oral al da. (Take 1 tablet (81 mg total) by mouth daily.)   carvedilol 25 MG tablet Commonly known as: COREG Tome 1 tableta (25 mg en total) por va oral 2 (dos) veces al [employer]. (Take 1 tablet (25 mg total) by mouth 2 (two) times daily.)   cetirizine 10 MG tablet Commonly known as: ZYRTEC Take 10 mg by mouth daily as needed for allergies.   Comfort EZ Pen Needles 32G X 4 MM Misc Generic drug: Insulin Pen Needle Use como se indica. (USE AS DIRECTED)   D3-50 1.25 MG (50000 UT) capsule Generic drug: Cholecalciferol Take 50,000 Units by mouth once a week.   Eliquis 5 MG Tabs tablet Generic drug: apixaban TOME 1 TABLETA POR BOCA DOS VECES AL DA. (TAKE 1 TABLET BY MOUTH TWICE DAILY.)   gabapentin 300 MG capsule Commonly known as: NEURONTIN Tome 1 cpsula (300 mg en total) por va oral antes de [employer]. (Take 1 capsule (300 mg total) by  mouth at bedtime for 7 days.)   hydrALAZINE 100 MG tablet Commonly known as: APRESOLINE Tome 1 tableta (100 mg en total) por va oral una vez cada 8 (ocho) horas. (Take 1 tablet (100 mg total) by mouth once every 8 (eight) hours.)   insulin detemir 100 UNIT/ML FlexPen Commonly known as: Levemir FlexTouch Inyecte 20 Unidades en la piel una vez al da a la hora de [place]. (Inject 20 Units into the skin once daily at bedtime.)   isosorbide mononitrate 30 MG 24 hr tablet Commonly known as: IMDUR Tome 1 tableta (30 mg en total) por va oral por la maana y al acostarse. (Take 1 tablet (30 mg total) by mouth in the morning and at bedtime.)   nitroGLYCERIN 0.4 MG SL tablet Commonly known as: NITROSTAT Place 1 tablet (0.4 mg total) under the tongue every 5 (five) minutes as needed for chest pain.   rosuvastatin 40 MG tablet Commonly known as: CRESTOR Tome 1 tableta por va oral una vez al da. (TAKE 1 TABLET BY MOUTH ONCE DAILY.)        Follow-up [place],  [place] Follow up in 1 week(s).   Specialty: General Practice Contact information: [place] [address] [phone]         Minna Merritts, MD .   Specialty: Cardiology Contact information: [name] [address] [phone]         Algernon Huxley, MD Follow up in 2 week(s).   Specialties: Vascular Surgery, Radiology, Interventional Cardiology Why: Please call Dr. Bunnie Domino office for further follow-up of your abnormal neck scan for carotid ultrasound and further plan Contact information: [place] [address] [phone]         Vladimir Crofts, MD Follow up in 1 week(s).   Specialty: Neurology Contact information: [place] [facility] [place] [address] [phone]                No Known Allergies  The results of significant diagnostics from this hospitalization (including imaging, microbiology, ancillary and laboratory) are listed below for reference.    Microbiology: Recent Results (from the past 240 hour(s))  Resp Panel by RT-PCR (Flu A&B, Covid) Nasopharyngeal Swab     Status: None   Collection Time: 07/14/21  4:35 PM   Specimen: Nasopharyngeal Swab; Nasopharyngeal(NP) swabs in vial transport medium  Result Value Ref Range Status   SARS Coronavirus 2 by RT PCR NEGATIVE NEGATIVE Final    Comment: (NOTE) SARS-CoV-2 target nucleic acids are NOT DETECTED.  The SARS-CoV-2 RNA is generally detectable in upper respiratory specimens during the acute phase of infection. The lowest concentration of SARS-CoV-2 viral copies this assay can detect is 138 copies/mL. A negative result does not preclude SARS-Cov-2 infection and should not be used as the sole basis for treatment or other patient management decisions. A negative result may occur with  improper specimen collection/handling, submission of specimen other than  nasopharyngeal swab, presence of viral mutation(s) within the areas targeted by this assay, and inadequate number of viral copies(<138 copies/mL). A negative result must be combined with clinical observations, patient history, and epidemiological information. The expected result is Negative.  Fact Sheet for Patients:  EntrepreneurPulse.com.au  Fact Sheet for Healthcare Providers:  IncredibleEmployment.be  This test is no t yet approved or cleared by the [place] FDA and  has been authorized for detection and/or diagnosis  of SARS-CoV-2 by FDA under an Emergency Use Authorization (EUA). This EUA will remain  in effect (meaning this test can be used) for the duration of the COVID-19 declaration under Section 564(b)(1) of the Act, 21 U.S.C.section 360bbb-3(b)(1), unless the authorization is terminated  or revoked sooner.       Influenza A by PCR NEGATIVE NEGATIVE Final   Influenza B by PCR NEGATIVE NEGATIVE Final    Comment: (NOTE) The Xpert Xpress SARS-CoV-2/FLU/RSV plus assay is intended as an aid in the diagnosis of influenza from Nasopharyngeal swab specimens and should not be used as a sole basis for treatment. Nasal washings and aspirates are unacceptable for Xpert Xpress SARS-CoV-2/FLU/RSV testing.  Fact Sheet for Patients: EntrepreneurPulse.com.au  Fact Sheet for Healthcare Providers: IncredibleEmployment.be  This test is not yet approved or cleared by the [place] FDA and has been authorized for detection and/or diagnosis of SARS-CoV-2 by FDA under an Emergency Use Authorization (EUA). This EUA will remain in effect (meaning this test can be used) for the duration of the COVID-19 declaration under Section 564(b)(1) of the Act, 21 U.S.C. section 360bbb-3(b)(1), unless the authorization is terminated or revoked.  Performed at [facility], [address], [place], [place]  [address]     Procedures/Studies: MR ANGIO HEAD WO CONTRAST  Result Date: 07/15/2021 CLINICAL DATA:  Acute stroke suspected. Left arm numbness, headache, and unsteady gait. EXAM: MRI HEAD WITHOUT CONTRAST MRA HEAD WITHOUT CONTRAST MRA OF THE NECK WITHOUT AND WITH CONTRAST TECHNIQUE: Multiplanar, multi-echo pulse sequences of the brain and surrounding structures were acquired without intravenous contrast. Angiographic images of the Circle of Willis were acquired using MRA technique without intravenous contrast. Angiographic images of the neck were acquired using MRA technique without and with intravenous contrast. Carotid stenosis measurements (when applicable) are obtained utilizing NASCET criteria, using the distal internal carotid diameter as the denominator. CONTRAST:  58mL GADAVIST GADOBUTROL 1 MMOL/ML IV SOLN COMPARISON:  No pertinent prior exam. FINDINGS: MR HEAD FINDINGS Brain: No acute infarction, hemorrhage, hydrocephalus, extra-axial collection or mass lesion. Small area of encephalomalacia along the left occipital cortex and separately at the high left precentral gyrus, usually from infarct. Few remote hemispheric white matter insults with nonspecific pattern. Brain volume is normal Vascular: Normal flow voids Skull and upper cervical spine: Normal marrow signal Sinuses/Orbits: Negative Other: Intermittent motion artifact MRA HEAD FINDINGS Anterior circulation: Vessels are smooth and widely patent. Negative for aneurysm or vascular malformation. Posterior circulation: Vessels are smooth and widely patent. Negative for aneurysm or vascular malformation. Anatomic variants:None significant MRA NECK FINDINGS Aortic arch: 2 vessel arch which is normal diameter and smoothly contoured. Right carotid system: Smoothly contoured and diffusely patent. Left carotid system: Proximal ICA narrowing with medial outpouching measuring 3-4 mm. The more distal of 2 narrowings at this site causes 50-60% stenosis. Vertebral  arteries: No proximal subclavian or vertebral stenosis. Undulating left vertebral artery but no beaded or dissection appearance. IMPRESSION: Brain MRI: 1. No acute finding. 2. Small remote cortical infarcts in the left occipital lobe and left precentral gyrus. Neck MRA: Focal irregularity and narrowing at the proximal left ICA with 50-60% stenosis and outpouching which may reflect plaque ulceration. Intracranial MRA: Negative. Electronically Signed   By: Jorje Guild M.D.   On: 07/15/2021 04:13   MR Angiogram Neck W or Wo Contrast  Result Date: 07/15/2021 CLINICAL DATA:  Acute stroke suspected. Left arm numbness, headache, and unsteady gait. EXAM: MRI HEAD WITHOUT CONTRAST MRA HEAD WITHOUT CONTRAST MRA OF THE NECK  WITHOUT AND WITH CONTRAST TECHNIQUE: Multiplanar, multi-echo pulse sequences of the brain and surrounding structures were acquired without intravenous contrast. Angiographic images of the Circle of Willis were acquired using MRA technique without intravenous contrast. Angiographic images of the neck were acquired using MRA technique without and with intravenous contrast. Carotid stenosis measurements (when applicable) are obtained utilizing NASCET criteria, using the distal internal carotid diameter as the denominator. CONTRAST:  37mL GADAVIST GADOBUTROL 1 MMOL/ML IV SOLN COMPARISON:  No pertinent prior exam. FINDINGS: MR HEAD FINDINGS Brain: No acute infarction, hemorrhage, hydrocephalus, extra-axial collection or mass lesion. Small area of encephalomalacia along the left occipital cortex and separately at the high left precentral gyrus, usually from infarct. Few remote hemispheric white matter insults with nonspecific pattern. Brain volume is normal Vascular: Normal flow voids Skull and upper cervical spine: Normal marrow signal Sinuses/Orbits: Negative Other: Intermittent motion artifact MRA HEAD FINDINGS Anterior circulation: Vessels are smooth and widely patent. Negative for aneurysm or vascular  malformation. Posterior circulation: Vessels are smooth and widely patent. Negative for aneurysm or vascular malformation. Anatomic variants:None significant MRA NECK FINDINGS Aortic arch: 2 vessel arch which is normal diameter and smoothly contoured. Right carotid system: Smoothly contoured and diffusely patent. Left carotid system: Proximal ICA narrowing with medial outpouching measuring 3-4 mm. The more distal of 2 narrowings at this site causes 50-60% stenosis. Vertebral arteries: No proximal subclavian or vertebral stenosis. Undulating left vertebral artery but no beaded or dissection appearance. IMPRESSION: Brain MRI: 1. No acute finding. 2. Small remote cortical infarcts in the left occipital lobe and left precentral gyrus. Neck MRA: Focal irregularity and narrowing at the proximal left ICA with 50-60% stenosis and outpouching which may reflect plaque ulceration. Intracranial MRA: Negative. Electronically Signed   By: Jorje Guild M.D.   On: 07/15/2021 04:13   MR BRAIN WO CONTRAST  Result Date: 07/15/2021 CLINICAL DATA:  Acute stroke suspected. Left arm numbness, headache, and unsteady gait. EXAM: MRI HEAD WITHOUT CONTRAST MRA HEAD WITHOUT CONTRAST MRA OF THE NECK WITHOUT AND WITH CONTRAST TECHNIQUE: Multiplanar, multi-echo pulse sequences of the brain and surrounding structures were acquired without intravenous contrast. Angiographic images of the Circle of Willis were acquired using MRA technique without intravenous contrast. Angiographic images of the neck were acquired using MRA technique without and with intravenous contrast. Carotid stenosis measurements (when applicable) are obtained utilizing NASCET criteria, using the distal internal carotid diameter as the denominator. CONTRAST:  41mL GADAVIST GADOBUTROL 1 MMOL/ML IV SOLN COMPARISON:  No pertinent prior exam. FINDINGS: MR HEAD FINDINGS Brain: No acute infarction, hemorrhage, hydrocephalus, extra-axial collection or mass lesion. Small area of  encephalomalacia along the left occipital cortex and separately at the high left precentral gyrus, usually from infarct. Few remote hemispheric white matter insults with nonspecific pattern. Brain volume is normal Vascular: Normal flow voids Skull and upper cervical spine: Normal marrow signal Sinuses/Orbits: Negative Other: Intermittent motion artifact MRA HEAD FINDINGS Anterior circulation: Vessels are smooth and widely patent. Negative for aneurysm or vascular malformation. Posterior circulation: Vessels are smooth and widely patent. Negative for aneurysm or vascular malformation. Anatomic variants:None significant MRA NECK FINDINGS Aortic arch: 2 vessel arch which is normal diameter and smoothly contoured. Right carotid system: Smoothly contoured and diffusely patent. Left carotid system: Proximal ICA narrowing with medial outpouching measuring 3-4 mm. The more distal of 2 narrowings at this site causes 50-60% stenosis. Vertebral arteries: No proximal subclavian or vertebral stenosis. Undulating left vertebral artery but no beaded or dissection appearance. IMPRESSION: Brain MRI: 1. No  acute finding. 2. Small remote cortical infarcts in the left occipital lobe and left precentral gyrus. Neck MRA: Focal irregularity and narrowing at the proximal left ICA with 50-60% stenosis and outpouching which may reflect plaque ulceration. Intracranial MRA: Negative. Electronically Signed   By: Jorje Guild M.D.   On: 07/15/2021 04:13   CT HEAD CODE STROKE WO CONTRAST  Result Date: 07/14/2021 CLINICAL DATA:  Neuro deficit, acute, stroke suspected left arm numbness/HA/unstable Gait EXAM: CT HEAD WITHOUT CONTRAST TECHNIQUE: Contiguous axial images were obtained from the base of the skull through the vertex without intravenous contrast. RADIATION DOSE REDUCTION: This exam was performed according to the departmental dose-optimization program which includes automated exposure control, adjustment of the mA and/or kV according  to patient size and/or use of iterative reconstruction technique. COMPARISON:  None. FINDINGS: Brain: No acute intracranial hemorrhage, mass effect, or edema. Gray-white differentiation is preserved. No extra-axial collection. Ventricles and sulci are normal in size and configuration. Vascular: No hyperdense vessel. There is mild intracranial atherosclerotic calcification at the skull base. Skull: Unremarkable. Sinuses/Orbits: No significant abnormality. Other: None. ASPECTS ([place] Stroke Program Early CT Score) - Ganglionic level infarction (caudate, lentiform nuclei, internal capsule, insula, M1-M3 cortex): 7 - Supraganglionic infarction (M4-M6 cortex): 3 Total score (0-10 with 10 being normal): 10 IMPRESSION: There is no acute intracranial hemorrhage or evidence of acute infarction. ASPECT score is 10. Electronically Signed   By: Macy Mis M.D.   On: 07/14/2021 13:36    Labs: BNP (last 3 results) Recent Labs    07/20/20 0443 12/23/20 1657 05/31/21 2210  BNP 584.1* 121.0* 0,998.3*   Basic Metabolic Panel: Recent Labs  Lab 07/14/21 1347  NA 135  K 4.2  CL 102  CO2 23  GLUCOSE 164*  BUN 31*  CREATININE 3.05*  CALCIUM 9.7   Liver Function Tests: Recent Labs  Lab 07/14/21 1347  AST 20  ALT 19  ALKPHOS 105  BILITOT 0.8  PROT 7.0  ALBUMIN 3.7   No results for input(s): LIPASE, AMYLASE in the last 168 hours. No results for input(s): AMMONIA in the last 168 hours. CBC: Recent Labs  Lab 07/14/21 1347  WBC 7.3  NEUTROABS 5.6  HGB 9.6*  HCT 28.2*  MCV 89.2  PLT 217   Cardiac Enzymes: No results for input(s): CKTOTAL, CKMB, CKMBINDEX, TROPONINI in the last 168 hours. BNP: Invalid input(s): POCBNP CBG: Recent Labs  Lab 07/14/21 1310 07/14/21 2154 07/15/21 0748  GLUCAP 171* 162* 150*   D-Dimer No results for input(s): DDIMER in the last 72 hours. Hgb A1c No results for input(s): HGBA1C in the last 72 hours. Lipid Profile Recent Labs    07/14/21 2022   CHOL 167  HDL 40*  LDLCALC 96  TRIG 155*  CHOLHDL 4.2   Thyroid function studies No results for input(s): TSH, T4TOTAL, T3FREE, THYROIDAB in the last 72 hours.  Invalid input(s): FREET3 Anemia work up No results for input(s): VITAMINB12, FOLATE, FERRITIN, TIBC, IRON, RETICCTPCT in the last 72 hours. Urinalysis No results found for: COLORURINE, APPEARANCEUR, [place], [place], GLUCOSEU, [place], [place], [place], PROTEINUR, UROBILINOGEN, NITRITE, LEUKOCYTESUR Sepsis Labs Invalid input(s): PROCALCITONIN,  WBC,  LACTICIDVEN Microbiology Recent Results (from the past 240 hour(s))  Resp Panel by RT-PCR (Flu A&B, Covid) Nasopharyngeal Swab     Status: None   Collection Time: 07/14/21  4:35 PM   Specimen: Nasopharyngeal Swab; Nasopharyngeal(NP) swabs in vial transport medium  Result Value Ref Range Status   SARS Coronavirus 2 by RT PCR NEGATIVE NEGATIVE Final  Comment: (NOTE) SARS-CoV-2 target nucleic acids are NOT DETECTED.  The SARS-CoV-2 RNA is generally detectable in upper respiratory specimens during the acute phase of infection. The lowest concentration of SARS-CoV-2 viral copies this assay can detect is 138 copies/mL. A negative result does not preclude SARS-Cov-2 infection and should not be used as the sole basis for treatment or other patient management decisions. A negative result may occur with  improper specimen collection/handling, submission of specimen other than nasopharyngeal swab, presence of viral mutation(s) within the areas targeted by this assay, and inadequate number of viral copies(<138 copies/mL). A negative result must be combined with clinical observations, patient history, and epidemiological information. The expected result is Negative.  Fact Sheet for Patients:  EntrepreneurPulse.com.au  Fact Sheet for Healthcare Providers:  IncredibleEmployment.be  This test is no t yet approved or cleared by the [place] FDA and  has been authorized for detection and/or diagnosis of SARS-CoV-2 by FDA under an Emergency Use Authorization (EUA). This EUA will remain  in effect (meaning this test can be used) for the duration of the COVID-19 declaration under Section 564(b)(1) of the Act, 21 U.S.C.section 360bbb-3(b)(1), unless the authorization is terminated  or revoked sooner.       Influenza A by PCR NEGATIVE NEGATIVE Final   Influenza B by PCR NEGATIVE NEGATIVE Final    Comment: (NOTE) The Xpert Xpress SARS-CoV-2/FLU/RSV plus assay is intended as an aid in the diagnosis of influenza from Nasopharyngeal swab specimens and should not be used as a sole basis for treatment. Nasal washings and aspirates are unacceptable for Xpert Xpress SARS-CoV-2/FLU/RSV testing.  Fact Sheet for Patients: EntrepreneurPulse.com.au  Fact Sheet for Healthcare Providers: IncredibleEmployment.be  This test is not yet approved or cleared by the [place] FDA and has been authorized for detection and/or diagnosis of SARS-CoV-2 by FDA under an Emergency Use Authorization (EUA). This EUA will remain in effect (meaning this test can be used) for the duration of the COVID-19 declaration under Section 564(b)(1) of the Act, 21 U.S.C. section 360bbb-3(b)(1), unless the authorization is terminated or revoked.  Performed at [facility], [address], [place], [place] [address]      Time coordinating discharge: 25 minutes  SIGNED: Antonieta Pert, MD  [facility] 07/15/2021, 12:58 PM  If 7PM-7AM, please contact night-coverage [email or web address]

## 2021-07-15 NOTE — Evaluation (Signed)
Occupational Therapy Evaluation Patient Details Name: Jimmy Olson MRN: [id number] DOB: 12-Oct-1960 Today's Date: 07/15/2021   History of Present Illness 61 year old Hispanic male with a history of type 2 diabetes, chronic systolic heart failure EF of 50%, end-stage renal disease, coronary artery disease, hyperlipidemia, severe depression presents to the ER today from community clinic.  Patient noted to have a severe headache around 11 AM. Determined to not have acute CVA.   Clinical Impression   Patient presenting with decreased Ind in self care, balance, functional mobility/transfers, endurance, and safety awareness. Patient declines use of interpreter this session. Pt reports living with wife at home and being independent with self care tasks without use of AD. He has a cane and RW but does not use. Pt needing min guard - min A for functional mobility and self care tasks in standing without use of AD. OT recommends use of RW at discharge for balance and safety as well as to conserve energy. PTA. Patient will benefit from acute OT to increase overall independence in the areas of ADLs, functional mobility,and safety awareness in order to safely discharge home with family.      Recommendations for follow up therapy are one component of a multi-disciplinary discharge planning process, led by the attending physician.  Recommendations may be updated based on patient status, additional functional criteria and insurance authorization.   Follow Up Recommendations  Home health OT    Assistance Recommended at Discharge Intermittent Supervision/Assistance  Patient can return home with the following A little help with walking and/or transfers;A little help with bathing/dressing/bathroom    Functional Status Assessment  Patient has had a recent decline in their functional status and demonstrates the ability to make significant improvements in function in a reasonable and predictable amount of time.   Equipment Recommendations  Other (comment) (pt has all needed equipment)    Recommendations for Other Services       Precautions / Restrictions Precautions Precautions: Fall      Mobility Bed Mobility Overal bed mobility: Modified Independent             General bed mobility comments: increased time and effort but no physical assist    Transfers Overall transfer level: Needs assistance Equipment used: None Transfers: Sit to/from Stand, Bed to chair/wheelchair/BSC Sit to Stand: Supervision     Step pivot transfers: Min guard            Balance Overall balance assessment: Needs assistance Sitting-balance support: Feet supported Sitting balance-Leahy Scale: Good     Standing balance support: During functional activity Standing balance-Leahy Scale: Fair Standing balance comment: min guard - min A for balance                           ADL either performed or assessed with clinical judgement   ADL Overall ADL's : Needs assistance/impaired     Grooming: Wash/dry hands;Wash/dry face;Oral care;Standing;Supervision/safety           Upper Body Dressing : Min guard Upper Body Dressing Details (indicate cue type and reason): to don hospital gown Lower Body Dressing: Sitting/lateral leans;Min guard Lower Body Dressing Details (indicate cue type and reason): socks             Functional mobility during ADLs: Min guard       Vision Patient Visual Report: No change from baseline       Perception     Praxis      Pertinent  Vitals/Pain Pain Assessment Pain Assessment: No/denies pain     Hand Dominance     Extremity/Trunk Assessment Upper Extremity Assessment Upper Extremity Assessment: Generalized weakness;Overall [facility] for tasks assessed   Lower Extremity Assessment Lower Extremity Assessment: Defer to PT evaluation       Communication Communication Communication: Prefers language other than English   Cognition  Arousal/Alertness: Awake/alert Behavior During Therapy: WFL for tasks assessed/performed Overall Cognitive Status: Within Functional Limits for tasks assessed                                                  Home Living Family/patient expects to be discharged to:: Private residence Living Arrangements: Spouse/significant other Available Help at Discharge: Family Type of Home: House Home Access: Stairs to enter Technical brewer of Steps: 3-4 Entrance Stairs-Rails: Left;Right Home Layout: One level               Home Equipment: Conservation officer, nature (2 wheels);Cane - single point          Prior Functioning/Environment Prior Level of Function : Needs assist             Mobility Comments: Pt reports he does not use AD for ambulation. ADLs Comments: Ind in self care. His wife assists with IADL tasks. He does not drive.        OT Problem List: Decreased strength;Decreased activity tolerance;Impaired balance (sitting and/or standing);Decreased knowledge of use of DME or AE;Decreased safety awareness      OT Treatment/Interventions: Self-care/ADL training;Therapeutic exercise;Therapeutic activities;DME and/or AE instruction;Patient/family education;Balance training    OT Goals(Current goals can be found in the care plan section) Acute Rehab OT Goals Patient Stated Goal: to go home and feel better OT Goal Formulation: With patient Time For Goal Achievement: 07/29/21 Potential to Achieve Goals: Good ADL Goals Pt Will Perform Grooming: with modified independence;standing Pt Will Perform Lower Body Dressing: with modified independence;sit to/from stand Pt Will Transfer to Toilet: with modified independence;ambulating Pt Will Perform Toileting - Clothing Manipulation and hygiene: with modified independence;sit to/from stand  OT Frequency: Min 2X/week       AM-PAC OT "6 Clicks" Daily Activity     Outcome Measure Help from another person eating meals?:  None Help from another person taking care of personal grooming?: A Little Help from another person toileting, which includes using toliet, bedpan, or urinal?: A Little Help from another person bathing (including washing, rinsing, drying)?: None Help from another person to put on and taking off regular upper body clothing?: A Little Help from another person to put on and taking off regular lower body clothing?: A Little 6 Click Score: 20   End of Session Nurse Communication: Mobility status  Activity Tolerance: Patient tolerated treatment well Patient left: in bed;with call bell/phone within reach;with bed alarm set  OT Visit Diagnosis: Unsteadiness on feet (R26.81);Muscle weakness (generalized) (M62.81)                Time: 7048-8891 OT Time Calculation (min): 12 min Charges:  OT General Charges $OT Visit: 1 Visit OT Evaluation $OT Eval Low Complexity: 1 Low  Darleen Crocker, MS, OTR/L , CBIS ascom [phone]  07/15/21, 11:05 AM

## 2021-07-15 NOTE — Progress Notes (Signed)
Pt being discharged home, discharge instruction reviewed with pt, wife and daughter, states understanding, pt with no complaints

## 2021-07-15 NOTE — Progress Notes (Signed)
*  PRELIMINARY RESULTS* Echocardiogram 2D Echocardiogram has been performed.  Jimmy Olson 07/15/2021, 11:55 AM

## 2021-07-15 NOTE — Progress Notes (Signed)
[facility]  ROUNDING NOTE   Subjective:   Jimmy Olson is a 61 y.o. Hispanic male with past medical concerns including CAD, hyperlipidemia, DVT on Eliquis, severe depression, and end stage renal disease on dialysis. Patient presents to ED with from the community clinic with a severe headache, dizziness and left arm numbness. Patient is admitted under observation for TIA (transient ischemic attack) [G45.9] Numbness [R20.0] Acute nonintractable headache, unspecified headache type [R51.9]  Patient is known to our clinic and receives dialysis at [facility] on a MWF schedule, supervised by Dr Candiss Norse. Patient received his last treatment on Wednesday. Patient states headache began about 11am on day of arrival. Reports being at the clinic for evaluation of arm numbness and dizziness. Required assistance with standing. Denies nausea, vomiting, and diarrhea. Denies shortness of breath.   Patient seen while working with PT, ambulating in hall Alert and oriented Denies discomfort and pain Tolerating meals    Objective:  Vital signs in last 24 hours:  Temp:  [97.8 F (36.6 C)-98.3 F (36.8 C)] 97.8 F (36.6 C) (01/27 0744) Pulse Rate:  [78-97] 78 (01/27 0744) Resp:  [10-31] 17 (01/27 1033) BP: (129-199)/(66-105) 175/82 (01/27 0744) SpO2:  [97 %-100 %] 98 % (01/27 0744)  Weight change:  There were no vitals filed for this visit.  Intake/Output: I/O last 3 completed shifts: In: -  Out: 250 [Urine:250]   Intake/Output this shift:  Total I/O In: 240 [P.O.:240] Out: -   Physical Exam: General: NAD, sitting in chair  Head: Normocephalic, atraumatic. Moist oral mucosal membranes  Eyes: Anicteric  Lungs:  Clear to auscultation, normal effort  Heart: Regular rate and rhythm  Abdomen:  Soft, nontender  Extremities:  no peripheral edema.  Neurologic: Nonfocal, moving all four extremities  Skin: No lesions  Access: Rt IJ Permcath    Basic Metabolic Panel: Recent  Labs  Lab 07/14/21 1347  NA 135  K 4.2  CL 102  CO2 23  GLUCOSE 164*  BUN 31*  CREATININE 3.05*  CALCIUM 9.7    Liver Function Tests: Recent Labs  Lab 07/14/21 1347  AST 20  ALT 19  ALKPHOS 105  BILITOT 0.8  PROT 7.0  ALBUMIN 3.7   No results for input(s): LIPASE, AMYLASE in the last 168 hours. No results for input(s): AMMONIA in the last 168 hours.  CBC: Recent Labs  Lab 07/14/21 1347  WBC 7.3  NEUTROABS 5.6  HGB 9.6*  HCT 28.2*  MCV 89.2  PLT 217    Cardiac Enzymes: No results for input(s): CKTOTAL, CKMB, CKMBINDEX, TROPONINI in the last 168 hours.  BNP: Invalid input(s): POCBNP  CBG: Recent Labs  Lab 07/14/21 1310 07/14/21 2154 07/15/21 0748  GLUCAP 171* 162* 150*    Microbiology: Results for orders placed or performed during the hospital encounter of 07/14/21  Resp Panel by RT-PCR (Flu A&B, Covid) Nasopharyngeal Swab     Status: None   Collection Time: 07/14/21  4:35 PM   Specimen: Nasopharyngeal Swab; Nasopharyngeal(NP) swabs in vial transport medium  Result Value Ref Range Status   SARS Coronavirus 2 by RT PCR NEGATIVE NEGATIVE Final    Comment: (NOTE) SARS-CoV-2 target nucleic acids are NOT DETECTED.  The SARS-CoV-2 RNA is generally detectable in upper respiratory specimens during the acute phase of infection. The lowest concentration of SARS-CoV-2 viral copies this assay can detect is 138 copies/mL. A negative result does not preclude SARS-Cov-2 infection and should not be used as the sole basis for treatment  or other patient management decisions. A negative result may occur with  improper specimen collection/handling, submission of specimen other than nasopharyngeal swab, presence of viral mutation(s) within the areas targeted by this assay, and inadequate number of viral copies(<138 copies/mL). A negative result must be combined with clinical observations, patient history, and epidemiological information. The expected result is  Negative.  Fact Sheet for Patients:  EntrepreneurPulse.com.au  Fact Sheet for Healthcare Providers:  IncredibleEmployment.be  This test is no t yet approved or cleared by the [place] FDA and  has been authorized for detection and/or diagnosis of SARS-CoV-2 by FDA under an Emergency Use Authorization (EUA). This EUA will remain  in effect (meaning this test can be used) for the duration of the COVID-19 declaration under Section 564(b)(1) of the Act, 21 U.S.C.section 360bbb-3(b)(1), unless the authorization is terminated  or revoked sooner.       Influenza A by PCR NEGATIVE NEGATIVE Final   Influenza B by PCR NEGATIVE NEGATIVE Final    Comment: (NOTE) The Xpert Xpress SARS-CoV-2/FLU/RSV plus assay is intended as an aid in the diagnosis of influenza from Nasopharyngeal swab specimens and should not be used as a sole basis for treatment. Nasal washings and aspirates are unacceptable for Xpert Xpress SARS-CoV-2/FLU/RSV testing.  Fact Sheet for Patients: EntrepreneurPulse.com.au  Fact Sheet for Healthcare Providers: IncredibleEmployment.be  This test is not yet approved or cleared by the [place] FDA and has been authorized for detection and/or diagnosis of SARS-CoV-2 by FDA under an Emergency Use Authorization (EUA). This EUA will remain in effect (meaning this test can be used) for the duration of the COVID-19 declaration under Section 564(b)(1) of the Act, 21 U.S.C. section 360bbb-3(b)(1), unless the authorization is terminated or revoked.  Performed at [facility], [address] [place],  [address]     Coagulation Studies: Recent Labs    07/14/21 1347  LABPROT 13.6  INR 1.0    Urinalysis: No results for input(s): COLORURINE, LABSPEC, PHURINE, GLUCOSEU, HGBUR, BILIRUBINUR, KETONESUR, PROTEINUR, UROBILINOGEN, NITRITE, LEUKOCYTESUR in the last 72 hours.  Invalid  input(s): APPERANCEUR    Imaging: MR ANGIO HEAD WO CONTRAST  Result Date: 07/15/2021 CLINICAL DATA:  Acute stroke suspected. Left arm numbness, headache, and unsteady gait. EXAM: MRI HEAD WITHOUT CONTRAST MRA HEAD WITHOUT CONTRAST MRA OF THE NECK WITHOUT AND WITH CONTRAST TECHNIQUE: Multiplanar, multi-echo pulse sequences of the brain and surrounding structures were acquired without intravenous contrast. Angiographic images of the Circle of Willis were acquired using MRA technique without intravenous contrast. Angiographic images of the neck were acquired using MRA technique without and with intravenous contrast. Carotid stenosis measurements (when applicable) are obtained utilizing NASCET criteria, using the distal internal carotid diameter as the denominator. CONTRAST:  37mL GADAVIST GADOBUTROL 1 MMOL/ML IV SOLN COMPARISON:  No pertinent prior exam. FINDINGS: MR HEAD FINDINGS Brain: No acute infarction, hemorrhage, hydrocephalus, extra-axial collection or mass lesion. Small area of encephalomalacia along the left occipital cortex and separately at the high left precentral gyrus, usually from infarct. Few remote hemispheric white matter insults with nonspecific pattern. Brain volume is normal Vascular: Normal flow voids Skull and upper cervical spine: Normal marrow signal Sinuses/Orbits: Negative Other: Intermittent motion artifact MRA HEAD FINDINGS Anterior circulation: Vessels are smooth and widely patent. Negative for aneurysm or vascular malformation. Posterior circulation: Vessels are smooth and widely patent. Negative for aneurysm or vascular malformation. Anatomic variants:None significant MRA NECK FINDINGS Aortic arch: 2 vessel arch which is normal diameter and smoothly contoured. Right carotid system: Smoothly  contoured and diffusely patent. Left carotid system: Proximal ICA narrowing with medial outpouching measuring 3-4 mm. The more distal of 2 narrowings at this site causes 50-60% stenosis.  Vertebral arteries: No proximal subclavian or vertebral stenosis. Undulating left vertebral artery but no beaded or dissection appearance. IMPRESSION: Brain MRI: 1. No acute finding. 2. Small remote cortical infarcts in the left occipital lobe and left precentral gyrus. Neck MRA: Focal irregularity and narrowing at the proximal left ICA with 50-60% stenosis and outpouching which may reflect plaque ulceration. Intracranial MRA: Negative. Electronically Signed   By: Jorje Guild M.D.   On: 07/15/2021 04:13   MR Angiogram Neck W or Wo Contrast  Result Date: 07/15/2021 CLINICAL DATA:  Acute stroke suspected. Left arm numbness, headache, and unsteady gait. EXAM: MRI HEAD WITHOUT CONTRAST MRA HEAD WITHOUT CONTRAST MRA OF THE NECK WITHOUT AND WITH CONTRAST TECHNIQUE: Multiplanar, multi-echo pulse sequences of the brain and surrounding structures were acquired without intravenous contrast. Angiographic images of the Circle of Willis were acquired using MRA technique without intravenous contrast. Angiographic images of the neck were acquired using MRA technique without and with intravenous contrast. Carotid stenosis measurements (when applicable) are obtained utilizing NASCET criteria, using the distal internal carotid diameter as the denominator. CONTRAST:  58mL GADAVIST GADOBUTROL 1 MMOL/ML IV SOLN COMPARISON:  No pertinent prior exam. FINDINGS: MR HEAD FINDINGS Brain: No acute infarction, hemorrhage, hydrocephalus, extra-axial collection or mass lesion. Small area of encephalomalacia along the left occipital cortex and separately at the high left precentral gyrus, usually from infarct. Few remote hemispheric white matter insults with nonspecific pattern. Brain volume is normal Vascular: Normal flow voids Skull and upper cervical spine: Normal marrow signal Sinuses/Orbits: Negative Other: Intermittent motion artifact MRA HEAD FINDINGS Anterior circulation: Vessels are smooth and widely patent. Negative for aneurysm  or vascular malformation. Posterior circulation: Vessels are smooth and widely patent. Negative for aneurysm or vascular malformation. Anatomic variants:None significant MRA NECK FINDINGS Aortic arch: 2 vessel arch which is normal diameter and smoothly contoured. Right carotid system: Smoothly contoured and diffusely patent. Left carotid system: Proximal ICA narrowing with medial outpouching measuring 3-4 mm. The more distal of 2 narrowings at this site causes 50-60% stenosis. Vertebral arteries: No proximal subclavian or vertebral stenosis. Undulating left vertebral artery but no beaded or dissection appearance. IMPRESSION: Brain MRI: 1. No acute finding. 2. Small remote cortical infarcts in the left occipital lobe and left precentral gyrus. Neck MRA: Focal irregularity and narrowing at the proximal left ICA with 50-60% stenosis and outpouching which may reflect plaque ulceration. Intracranial MRA: Negative. Electronically Signed   By: Jorje Guild M.D.   On: 07/15/2021 04:13   MR BRAIN WO CONTRAST  Result Date: 07/15/2021 CLINICAL DATA:  Acute stroke suspected. Left arm numbness, headache, and unsteady gait. EXAM: MRI HEAD WITHOUT CONTRAST MRA HEAD WITHOUT CONTRAST MRA OF THE NECK WITHOUT AND WITH CONTRAST TECHNIQUE: Multiplanar, multi-echo pulse sequences of the brain and surrounding structures were acquired without intravenous contrast. Angiographic images of the Circle of Willis were acquired using MRA technique without intravenous contrast. Angiographic images of the neck were acquired using MRA technique without and with intravenous contrast. Carotid stenosis measurements (when applicable) are obtained utilizing NASCET criteria, using the distal internal carotid diameter as the denominator. CONTRAST:  46mL GADAVIST GADOBUTROL 1 MMOL/ML IV SOLN COMPARISON:  No pertinent prior exam. FINDINGS: MR HEAD FINDINGS Brain: No acute infarction, hemorrhage, hydrocephalus, extra-axial collection or mass lesion.  Small area of encephalomalacia along the left occipital cortex and  separately at the high left precentral gyrus, usually from infarct. Few remote hemispheric white matter insults with nonspecific pattern. Brain volume is normal Vascular: Normal flow voids Skull and upper cervical spine: Normal marrow signal Sinuses/Orbits: Negative Other: Intermittent motion artifact MRA HEAD FINDINGS Anterior circulation: Vessels are smooth and widely patent. Negative for aneurysm or vascular malformation. Posterior circulation: Vessels are smooth and widely patent. Negative for aneurysm or vascular malformation. Anatomic variants:None significant MRA NECK FINDINGS Aortic arch: 2 vessel arch which is normal diameter and smoothly contoured. Right carotid system: Smoothly contoured and diffusely patent. Left carotid system: Proximal ICA narrowing with medial outpouching measuring 3-4 mm. The more distal of 2 narrowings at this site causes 50-60% stenosis. Vertebral arteries: No proximal subclavian or vertebral stenosis. Undulating left vertebral artery but no beaded or dissection appearance. IMPRESSION: Brain MRI: 1. No acute finding. 2. Small remote cortical infarcts in the left occipital lobe and left precentral gyrus. Neck MRA: Focal irregularity and narrowing at the proximal left ICA with 50-60% stenosis and outpouching which may reflect plaque ulceration. Intracranial MRA: Negative. Electronically Signed   By: Jorje Guild M.D.   On: 07/15/2021 04:13   CT HEAD CODE STROKE WO CONTRAST  Result Date: 07/14/2021 CLINICAL DATA:  Neuro deficit, acute, stroke suspected left arm numbness/HA/unstable Gait EXAM: CT HEAD WITHOUT CONTRAST TECHNIQUE: Contiguous axial images were obtained from the base of the skull through the vertex without intravenous contrast. RADIATION DOSE REDUCTION: This exam was performed according to the departmental dose-optimization program which includes automated exposure control, adjustment of the mA  and/or kV according to patient size and/or use of iterative reconstruction technique. COMPARISON:  None. FINDINGS: Brain: No acute intracranial hemorrhage, mass effect, or edema. Gray-white differentiation is preserved. No extra-axial collection. Ventricles and sulci are normal in size and configuration. Vascular: No hyperdense vessel. There is mild intracranial atherosclerotic calcification at the skull base. Skull: Unremarkable. Sinuses/Orbits: No significant abnormality. Other: None. ASPECTS ([place] Stroke Program Early CT Score) - Ganglionic level infarction (caudate, lentiform nuclei, internal capsule, insula, M1-M3 cortex): 7 - Supraganglionic infarction (M4-M6 cortex): 3 Total score (0-10 with 10 being normal): 10 IMPRESSION: There is no acute intracranial hemorrhage or evidence of acute infarction. ASPECT score is 10. Electronically Signed   By: Macy Mis M.D.   On: 07/14/2021 13:36     Medications:    sodium chloride     sodium chloride      apixaban  5 mg Oral BID   aspirin EC  81 mg Oral Daily   Chlorhexidine Gluconate Cloth  6 each Topical Q0600   insulin aspart  0-5 Units Subcutaneous QHS   insulin aspart  0-6 Units Subcutaneous TID WC   insulin detemir  10 Units Subcutaneous QHS   rosuvastatin  40 mg Oral Daily   sodium chloride, sodium chloride, acetaminophen **OR** acetaminophen (TYLENOL) oral liquid 160 mg/5 mL **OR** acetaminophen, alteplase, heparin, lidocaine (PF), lidocaine-prilocaine, pentafluoroprop-tetrafluoroeth  Assessment/ Plan:  Mr. Jimmy Olson is a 61 y.o.  male with past medical concerns including CAD, hyperlipidemia, DVT on Eliquis, severe depression, and end stage renal disease on dialysis. Patient presents to ED with from the community clinic with a severe headache, dizziness and left arm numbness. Patient is admitted under observation for TIA (transient ischemic attack) [G45.9] Numbness [R20.0] Acute nonintractable headache, unspecified headache type  [R51.9]  [facility]/MWF/Rt IJ Permcath/65.0 kg  Anemia with chronic kidney disease.  Normocytic.  Hemoglobin 9.6.  Within acceptable range but will continue to  monitor  2.  End-stage renal disease on hemodialysis.  Will maintain outpatient schedule during this admission.  Plan to dialyze today, UF goal 0.5 to 1 L as tolerated.  Next treatment scheduled for Monday.  3. Secondary Hyperparathyroidism:  Lab Results  Component Value Date   CALCIUM 9.7 07/14/2021   PHOS 5.3 (H) 06/05/2021    Calcium and phosphorus within acceptable range.  We will continue to monitor  4.  Transient ischemic attack.  Neurology following.  MRI negative for acute stroke.  MRA shows presence of ulcerated plaque in the proximal left ICA with 50 to 60% stenosis.  Recommending carotid ultrasound.  Continue Eliquis and outpatient follow-up with neurology at discharge.   LOS: 0   1/27/202311:20 AM

## 2021-07-15 NOTE — Progress Notes (Signed)
Cross Cover Dose of valium and additional dose of IV ativan was need for patient o complete MRI

## 2021-07-16 LAB — HEPATITIS B SURFACE ANTIBODY, QUANTITATIVE: Hep B S AB Quant (Post): <3.1 m[IU]/mL — ABNORMAL LOW (ref >9.9)

## 2021-07-24 NOTE — Progress Notes (Signed)
Cardiology Office Note  Date:  07/25/2021   ID:  Jimmy Olson, Jimmy Olson 07-21-1960, MRN [id number]  PCP:  [facility]   Chief Complaint  Patient presents with   6 month follow up     Patient c/o shortness of breath, irregular heartbeats and chest pain. Medications reviewed by the patient verbally.      HPI:  Jimmy Olson is a 61 y.o. male with history of  nonobstructive CAD by LHC in 06/2020,  chronic combined systolic and diastolic CHF,  NICM,  CKD stage IV/esrd, on HD Covid infection in 02/2020 status post monoclonal antibody infusion,  DM2,  HTN,  HLD, " anemia, and anxiety  who presents for follow up of his cardiomyopathy, CAD/nsetmi, DVT lower extremity December 2022 on Eliquis  Last seen in clinic by myself August 2022  Seen in the hospital for shortness of breath, pulmonary edema, troponin 4000 Left heart catheterization June 03, 2021  status post ablation of AVNRT 05/20/2019. 1.  Heavily calcified coronary arteries with moderate two-vessel coronary artery disease involving the LAD and right coronary artery.  No culprit is identified for non-STEMI. 2.  Mildly reduced LV systolic function and mildly elevated left ventricular end-diastolic pressure.  disease appears to be chronic with no acute culprit identified  Echocardiogram reviewed Left ventricular ejection fraction, by estimation, is 45 to 50%. The  left ventricle has mildly decreased function. The left ventricle  demonstrates global hypokinesis. There is mild left ventricular  hypertrophy. Left ventricular diastolic parameters  are consistent with Grade I diastolic dysfunction (impaired relaxation).   2. Right ventricular systolic function is normal. The right ventricular  size is normal. Tricuspid regurgitation signal is inadequate for assessing  PA pressure.   discharge from the hospital July 15, 2021 Admission to the hospital for Severe headache along with left arm numbness and  dizziness inability to stand without assistance: Work-up showed no acute stroke, likely TIA.  Continue Eliquis was recommendation  In follow-up today presents with translator reports that he continues to have PND/orthopnea SOB at night, "water" in lungs, feel like he needs oxygen Sleeping in a recliner Esrd on HD, MOn/wed/fri Reports dialysis was cut back from 4 hours to 3 hours 45 minutes  Not on lasix Does not drink too much  On asa and eliquis (DVT)  Gets his medications for medical management  EKG personally reviewed by myself on todays visit Normal sinus rhythm nonspecific ST-T wave abnormality  Other past medical history reviewed  hospital in 08/2019 with exertional chest pain with high-sensitivity troponin peaking at 199.  Echo showed an EF of 45 to 50%, global hypokinesis, moderate LVH, and grade 1 diastolic dysfunction.    admitted in 06/2020 with acute on chronic combined systolic and diastolic CHF complicated by acute on CKD stage IV.   Echo on 07/16/2020 showed an EF of 30 to 35%, global hypokinesis, mildly dilated LV cavity size, mild LVH, grade 2 diastolic dysfunction, normal RV systolic function and ventricular cavity size, mildly dilated left atrium, and mild mitral valve regurgitation.    R/LHC on 07/19/2020 showed moderately calcified coronary arteries with moderate two-vessel CAD involving the LAD and RCA  RHC showed mildly elevated filling pressures, mild pulmonary hypertension, and normal cardiac output.    acute on CKD stage IV with consideration for hemodialysis  evaluated by psychiatry with anxiety, depression, and suicidal ideation.     PMH:   has a past medical history of Acute on chronic HFrEF (heart failure with  reduced ejection fraction) ([place]) (07/15/2020), ATN (acute tubular necrosis) (HCC), Chronic HFrEF (heart failure with reduced ejection fraction) ([place]), Chronic kidney disease (CKD), stage IV (severe) ([place]), COVID-19 virus infection (02/2020), Demand  ischemia ([place]), Diabetes mellitus without complication ([place]), Elevated troponin (07/15/2020), Hyperkalemia (12/23/2020), Hypertension, NICM (nonischemic cardiomyopathy) ([place]), Nonobstructive CAD (coronary artery disease), and NSTEMI (non-ST elevated myocardial infarction) ([place]).  PSH:    Past Surgical History:  Procedure Laterality Date   APPENDECTOMY     CARDIAC CATHETERIZATION     DIALYSIS/PERMA CATHETER INSERTION N/A 06/02/2021   Procedure: DIALYSIS/PERMA CATHETER INSERTION;  Surgeon: Algernon Huxley, MD;  Location: [facility];  Service: Cardiovascular;  Laterality: N/A;   LEFT HEART CATH AND CORONARY ANGIOGRAPHY N/A 06/03/2021   Procedure: LEFT HEART CATH AND CORONARY ANGIOGRAPHY;  Surgeon: Wellington Hampshire, MD;  Location: [facility];  Service: Cardiovascular;  Laterality: N/A;   NASAL SINUS SURGERY     RIGHT/LEFT HEART CATH AND CORONARY ANGIOGRAPHY N/A 07/19/2020   Procedure: RIGHT/LEFT HEART CATH AND CORONARY ANGIOGRAPHY;  Surgeon: Wellington Hampshire, MD;  Location: [facility];  Service: Cardiovascular;  Laterality: N/A;    Current Outpatient Medications  Medication Sig Dispense Refill   acetaminophen (TYLENOL) 325 MG tablet Take 325 mg by mouth every 6 (six) hours as needed.     albuterol (PROVENTIL HFA) 108 (90 Base) MCG/ACT inhaler INHALE 2 PUFFS INTO THE LUNGS ONCE EVERY 6 HOURS AS NEEDED FOR WHEEZING OR SHORTNESS OF BREATH. 6.7 g 10   apixaban (ELIQUIS) 5 MG TABS tablet TAKE 1 TABLET BY MOUTH TWICE DAILY. 180 tablet 1   aspirin (ASPIRIN ADULT LOW STRENGTH) 81 MG EC tablet Take 1 tablet (81 mg total) by mouth daily. 30 tablet 5   carvedilol (COREG) 25 MG tablet Take 1 tablet (25 mg total) by mouth 2 (two) times daily. 60 tablet 5   cetirizine (ZYRTEC) 10 MG tablet Take 10 mg by mouth daily as needed for allergies.     D3-50 1.25 MG (50000 UT) capsule Take 50,000 Units by mouth once a week.     gabapentin (NEURONTIN) 300 MG capsule Take 1 capsule (300 mg  total) by mouth at bedtime for 7 days. 7 capsule 0   HUMALOG KWIKPEN 100 UNIT/ML KwikPen Inject 3 Units into the skin 3 (three) times daily.     hydrALAZINE (APRESOLINE) 100 MG tablet Take 1 tablet (100 mg total) by mouth once every 8 (eight) hours. 90 tablet 5   insulin detemir (LEVEMIR FLEXTOUCH) 100 UNIT/ML FlexPen Inject 20 Units into the skin once daily at bedtime. 15 mL 5   Insulin Pen Needle 32G X 4 MM MISC USE AS DIRECTED 100 each 0   isosorbide mononitrate (IMDUR) 30 MG 24 hr tablet Take 1 tablet (30 mg total) by mouth in the morning and at bedtime. 60 tablet 5   nitroGLYCERIN (NITROSTAT) 0.4 MG SL tablet Place 1 tablet (0.4 mg total) under the tongue every 5 (five) minutes as needed for chest pain. 30 tablet 0   rosuvastatin (CRESTOR) 40 MG tablet TAKE 1 TABLET BY MOUTH ONCE DAILY. 90 tablet 1   No current facility-administered medications for this visit.    Allergies:   Patient has no known allergies.   Social History:  The patient  reports that he has never smoked. He has never used smokeless tobacco. He reports that he does not drink alcohol and does not use drugs.   Family History:   family history includes Diabetes in  his brother; Heart failure in his mother; Kidney failure in his mother; Other in his father.    Review of Systems: Review of Systems  Constitutional: Negative.   HENT: Negative.    Respiratory:  Positive for shortness of breath.   Cardiovascular: Negative.   Gastrointestinal: Negative.   Musculoskeletal: Negative.   Neurological: Negative.   Psychiatric/Behavioral: Negative.    All other systems reviewed and are negative.   PHYSICAL EXAM: VS:  BP 130/62 (BP Location: Left Arm, Patient Position: Sitting, Cuff Size: Normal)    Ht 5\' 6"  (1.676 m)    Wt 149 lb (67.6 kg)    SpO2 98%    BMI 24.05 kg/m  , BMI Body mass index is 24.05 kg/m. GEN: Well nourished, well developed, in no acute distress HEENT: normal Neck: no JVD, carotid bruits, or  masses Cardiac: RRR; no murmurs, rubs, or gallops,no edema  Respiratory:  clear to auscultation bilaterally, normal work of breathing GI: soft, nontender, nondistended, + BS MS: no deformity or atrophy Skin: warm and dry, no rash Neuro:  Strength and sensation are intact Psych: euthymic mood, full affect   Recent Labs: 05/31/2021: B Natriuretic Peptide 1,820.5 06/03/2021: Magnesium 1.9 07/14/2021: ALT 19; BUN 31; Creatinine, Ser 3.05; Hemoglobin 9.6; Platelets 217; Potassium 4.2; Sodium 135    Lipid Panel Lab Results  Component Value Date   CHOL 167 07/14/2021   HDL 40 (L) 07/14/2021   LDLCALC 96 07/14/2021   TRIG 155 (H) 07/14/2021      Wt Readings from Last 3 Encounters:  07/25/21 149 lb (67.6 kg)  07/15/21 134 lb 11.2 oz (61.1 kg)  06/07/21 142 lb 12.8 oz (64.8 kg)     ASSESSMENT AND PLAN:  Problem List Items Addressed This Visit       Cardiology Problems   Accelerated hypertension   Chronic combined systolic and diastolic CHF (congestive heart failure) (HCC) - Primary     Other   Type II diabetes mellitus with renal manifestations (HCC)   Relevant Medications   HUMALOG KWIKPEN 100 UNIT/ML KwikPen   Other Visit Diagnoses     Primary hypertension       Hypertensive heart disease with heart failure ([place])       Coronary artery disease of native artery of native heart with stable angina pectoris ([place])       Chronic kidney disease (CKD), stage IV (severe) ([place])          End-stage renal disease on hemodialysis Dialysis Monday Wednesday and Friday He is concerned about his " fluid" causing shortness of breath when supine Recommend he try Lasix 80 mg on Saturday and Sunday Talk with Dr. Candiss Norse if this is appropriate and if he needs diuretic Tuesday and Thursday as well  Coronary artery disease with stable angina Currently with no symptoms of angina. No further workup at this time. Continue current medication regimen., Reiterated the need to stay on his  Crestor  Poorly controlled diabetes type 2 with complications Management per [name]    Total encounter time more than 25 minutes  Greater than 50% was spent in counseling and coordination of care with the patient    Signed, Esmond Plants, M.D., Ph.D. [place], [place]

## 2021-07-25 ENCOUNTER — Other Ambulatory Visit: Payer: Self-pay

## 2021-07-25 ENCOUNTER — Ambulatory Visit (INDEPENDENT_AMBULATORY_CARE_PROVIDER_SITE_OTHER): Payer: Medicaid Other | Admitting: Cardiovascular Disease

## 2021-07-25 ENCOUNTER — Encounter: Payer: Self-pay | Admitting: Cardiovascular Disease

## 2021-07-25 VITALS — BP 130/62 | Ht 66.0 in | Wt 149.0 lb

## 2021-07-25 DIAGNOSIS — I10 Essential (primary) hypertension: Secondary | ICD-10-CM | POA: Diagnosis not present

## 2021-07-25 DIAGNOSIS — I5042 Chronic combined systolic (congestive) and diastolic (congestive) heart failure: Secondary | ICD-10-CM | POA: Diagnosis not present

## 2021-07-25 DIAGNOSIS — Z794 Long term (current) use of insulin: Secondary | ICD-10-CM

## 2021-07-25 DIAGNOSIS — E1122 Type 2 diabetes mellitus with diabetic chronic kidney disease: Secondary | ICD-10-CM | POA: Diagnosis not present

## 2021-07-25 DIAGNOSIS — I25118 Atherosclerotic heart disease of native coronary artery with other forms of angina pectoris: Secondary | ICD-10-CM

## 2021-07-25 DIAGNOSIS — I110 Hypertensive heart disease with heart failure: Secondary | ICD-10-CM | POA: Diagnosis not present

## 2021-07-25 DIAGNOSIS — G459 Transient cerebral ischemic attack, unspecified: Secondary | ICD-10-CM

## 2021-07-25 DIAGNOSIS — N184 Chronic kidney disease, stage 4 (severe): Secondary | ICD-10-CM

## 2021-07-25 MED ORDER — ROSUVASTATIN CALCIUM 40 MG PO TABS
40.0000 mg | ORAL_TABLET | Freq: Every day | ORAL | 3 refills | Status: DC
  Filled 2021-07-25: qty 90, 90d supply, fill #0

## 2021-07-25 MED ORDER — ISOSORBIDE MONONITRATE ER 30 MG PO TB24
30.0000 mg | ORAL_TABLET | Freq: Two times a day (BID) | ORAL | 6 refills | Status: DC
  Filled 2021-07-25 – 2021-08-18 (×2): qty 60, 30d supply, fill #0
  Filled 2021-09-23: qty 60, 30d supply, fill #1

## 2021-07-25 MED ORDER — HYDRALAZINE HCL 100 MG PO TABS
100.0000 mg | ORAL_TABLET | Freq: Three times a day (TID) | ORAL | 6 refills | Status: DC
  Filled 2021-07-25 – 2021-08-18 (×2): qty 90, 30d supply, fill #0
  Filled 2021-09-23: qty 90, 30d supply, fill #1
  Filled 2021-10-24: qty 90, 30d supply, fill #2
  Filled 2022-01-18: qty 90, 30d supply, fill #0

## 2021-07-25 MED ORDER — FUROSEMIDE 80 MG PO TABS: ORAL_TABLET | ORAL | 3 refills | Status: DC

## 2021-07-25 MED ORDER — CARVEDILOL 25 MG PO TABS
25.0000 mg | ORAL_TABLET | Freq: Two times a day (BID) | ORAL | 6 refills | Status: DC
  Filled 2021-07-25 – 2021-08-18 (×2): qty 60, 30d supply, fill #0
  Filled 2021-09-23: qty 60, 30d supply, fill #1
  Filled 2021-10-24: qty 60, 30d supply, fill #2

## 2021-07-25 NOTE — Patient Instructions (Addendum)
Medication Instructions:  Madaline Brilliant to stop aspirin Stay on eliquis until June 2023 Back on aspirin in June Can discuss at follow-up appt.   Please START Lasix/furosemide 80 mg daily as needed on nondialysis days Take for shortness of breath or swelling  If you need a refill on your cardiac medications before your next appointment, please call your pharmacy.   Lab work: No new labs needed  Testing/Procedures: Carotid ultrasound Your physician has requested that you have a carotid duplex. This test is an ultrasound of the carotid arteries in your neck. It looks at blood flow through these arteries that supply the brain with blood. Allow one hour for this exam. There are no restrictions or special instructions.   Follow-Up: At [facility], you and your health needs are our priority.  As part of our continuing mission to provide you with exceptional heart care, we have created designated Provider Care Teams.  These Care Teams include your primary Cardiologist (physician) and Advanced Practice Providers (APPs -  Physician Assistants and Nurse Practitioners) who all work together to provide you with the care you need, when you need it.  You will need a follow up appointment in 6 months, App ok  Providers on your designated Care Team:   Murray Hodgkins, NP Christell Faith, PA-C Cadence Kathlen Mody, [place]  COVID-19 Vaccine Information can be found at: [email or web address].uk For questions related to vaccine distribution or appointments, please email [email or web address] or call [phone].

## 2021-07-26 ENCOUNTER — Ambulatory Visit: Payer: Self-pay

## 2021-07-26 ENCOUNTER — Other Ambulatory Visit: Payer: Self-pay

## 2021-07-27 ENCOUNTER — Other Ambulatory Visit: Payer: Self-pay

## 2021-07-27 ENCOUNTER — Ambulatory Visit: Payer: Self-pay | Admitting: Pharmacy Technician

## 2021-07-27 DIAGNOSIS — Z79899 Other long term (current) drug therapy: Secondary | ICD-10-CM

## 2021-07-27 NOTE — Progress Notes (Signed)
Patient speaks Spanish.  Interpretation provided by [facility] ID# [employer].    Met with patient completed financial assistance application for [place] due to recent hospital visit.  Patient requested that I fax information to Patient [employer] [facility].    Completed [facility] application and contract.  Patient agreed to all terms of the [facility] contract.    Patient approved to receive medication assistance at [facility] until time for re-certification in 2800, and as long as eligibility criteria continues to be met.    Provided patient with Civil engineer, contracting based on his particular needs.    Eliquis & Levemir Prescription Application completed with patient.  Forwarded to Dr. Rebeca Alert for signature.  Upon receipt of signed applications from provider, [place] Prescription Applications will be submitted to [place].  [facility]

## 2021-07-28 ENCOUNTER — Ambulatory Visit: Payer: Self-pay | Admitting: Gerontology

## 2021-08-03 DIAGNOSIS — I6529 Occlusion and stenosis of unspecified carotid artery: Secondary | ICD-10-CM | POA: Insufficient documentation

## 2021-08-03 NOTE — Progress Notes (Signed)
MRN : [id number]  Jimmy Olson is a 61 y.o. (02/19/1961) male who presents with chief complaint of might have had a stroke.  History of Present Illness:    The patient is seen for evaluation of dialysis access.  The patient has never had an arm accesses.    Current access is via a catheter which is functioning poorly.  There have been several episodes of catheter infection.  Patient is also seen for chronic occlusive thrombus of his arm.  He has been on Eliquis for anticoagulation.  His access concern is complicated by a recent ultrasound that suggested thrombus in the left axillary vein.  No swelling of the left arm.  No trauma or PIC lines.  No pain in the left arm  He is right handed.  The patient is also seen for evaluation of carotid stenosis. The carotid stenosis was identified after he presented to [facility] with sudden onset of left arm numbness associated with dizziness.  Because of these complaints he was admitted on July 14, 2021.  Subsequent MRI/MRA was negative for infarct but did suggest a 60 to 65% stenosis of the left carotid.  Right carotid demonstrated minimal disease.  The patient denies amaurosis fugax. There is no recurrence of TIA symptoms or focal motor deficits. There is no prior documented CVA.  There is no history of migraine headaches. There is no history of seizures.  The patient is taking enteric-coated aspirin 81 mg daily.  The patient has a history of coronary artery disease, no recent episodes of angina or shortness of breath. The patient denies PAD or claudication symptoms. There is a history of hyperlipidemia which is being treated with a statin.    No outpatient medications have been marked as taking for the 08/04/21 encounter (Appointment) with Delana Meyer, Dolores Lory, MD.    Past Medical History:  Diagnosis Date   Acute on chronic HFrEF (heart failure with reduced ejection fraction) ([place]) 07/15/2020   ATN (acute tubular  necrosis) (HCC)    Chronic HFrEF (heart failure with reduced ejection fraction) ([place])    a. 06/2020 Echo: EF 30-35%, glob HK, GrII DD, nl RV fxn. Mild LAE.   Chronic kidney disease (CKD), stage IV (severe) ([place])    COVID-19 virus infection 02/2020   Demand ischemia (HCC)    Diabetes mellitus without complication ([place])    Elevated troponin 07/15/2020   Hyperkalemia 12/23/2020   Hypertension    NICM (nonischemic cardiomyopathy) ([place])    a. 07/2009 MV: EF 48%; b. 08/2019 Echo: EF 45-50%. Global HK. Mod LVH. GrIDD; c. 06/2020 Echo: EF 30-35%, glob HK, mild LVH, GrII DD, nl RV fxn, mild LAE; d. 06/2020 Cath: nonobs dzs.   Nonobstructive CAD (coronary artery disease)    a. 07/2009 MV: EF 48%. No ischemia; b. 06/2020 Cath: LM min irregs, LAD 40p, 23m, D1 70, D2 min irregs, D3 nl, RI min irregs, LCX small, mild diff dzs, RCA 20p, 83m, 50d, RPAV 50-->Med Rx.   NSTEMI (non-ST elevated myocardial infarction) [facility])     Past Surgical History:  Procedure Laterality Date   APPENDECTOMY     CARDIAC CATHETERIZATION     DIALYSIS/PERMA CATHETER INSERTION N/A 06/02/2021   Procedure: DIALYSIS/PERMA CATHETER INSERTION;  Surgeon: Algernon Huxley, MD;  Location: [place] [facility];  Service: Cardiovascular;  Laterality: N/A;   LEFT HEART CATH AND CORONARY ANGIOGRAPHY N/A 06/03/2021   Procedure: LEFT HEART CATH AND CORONARY ANGIOGRAPHY;  Surgeon: Wellington Hampshire, MD;  Location:  [place] [facility];  Service: Cardiovascular;  Laterality: N/A;   NASAL SINUS SURGERY     RIGHT/LEFT HEART CATH AND CORONARY ANGIOGRAPHY N/A 07/19/2020   Procedure: RIGHT/LEFT HEART CATH AND CORONARY ANGIOGRAPHY;  Surgeon: Wellington Hampshire, MD;  Location: [place] [facility];  Service: Cardiovascular;  Laterality: N/A;    Social History Social History   Tobacco Use   Smoking status: Never   Smokeless tobacco: Never  Vaping Use   Vaping Use: Never used  Substance Use Topics   Alcohol use: Never   Drug use: Never    Family  History Family History  Problem Relation Age of Onset   Kidney failure Mother        died @ 37   Heart failure Mother    Other Father        he never knew his father   Diabetes Brother     No Known Allergies   REVIEW OF SYSTEMS (Negative unless checked)  Constitutional: [] Weight loss  [] Fever  [] Chills Cardiac: [] Chest pain   [] Chest pressure   [] Palpitations   [] Shortness of breath when laying flat   [] Shortness of breath with exertion. Vascular:  [] Pain in legs with walking   [] Pain in legs at rest  [x] History of DVT   [] Phlebitis   [] Swelling in legs   [] Varicose veins   [] Non-healing ulcers Pulmonary:   [] Uses home oxygen   [] Productive cough   [] Hemoptysis   [] Wheeze  [] COPD   [] Asthma Neurologic:  [] Dizziness   [] Seizures   [] History of stroke   [x] History of TIA  [] Aphasia   [] Vissual changes   [] Weakness or numbness in arm   [] Weakness or numbness in leg Musculoskeletal:   [] Joint swelling   [] Joint pain   [] Low back pain Hematologic:  [] Easy bruising  [] Easy bleeding   [] Hypercoagulable state   [] Anemic Gastrointestinal:  [] Diarrhea   [] Vomiting  [] Gastroesophageal reflux/heartburn   [] Difficulty swallowing. Genitourinary:  [] Chronic kidney disease   [] Difficult urination  [] Frequent urination   [] Blood in urine Skin:  [] Rashes   [] Ulcers  Psychological:  [] History of anxiety   []  History of major depression.  Physical Examination  There were no vitals filed for this visit. There is no height or weight on file to calculate BMI. Gen: WD/WN, NAD Head: [place]/AT, No temporalis wasting.  Ear/Nose/Throat: Hearing grossly intact, nares w/o erythema or drainage Eyes: PER, EOMI, sclera nonicteric.  Neck: Supple, no masses.  No bruit or JVD.  Pulmonary:  Good air movement, no audible wheezing, no use of accessory muscles.  Cardiac: RRR, normal S1, S2, no Murmurs. Vascular:  left arm is symmetric compared to left  visible cephalic veins bilaterally.  Left carotid bruit Vessel Right  Left  Radial Palpable Palpable  Carotid Palpable Palpable  Gastrointestinal: soft, non-distended. No guarding/no peritoneal signs.  Musculoskeletal: M/S 5/5 throughout.  No visible deformity.  Neurologic: CN 2-12 intact. Pain and light touch intact in extremities.  Symmetrical.  Speech is fluent. Motor exam as listed above. Psychiatric: Judgment intact, Mood & affect appropriate for pt's clinical situation. Dermatologic: No rashes or ulcers noted.  No changes consistent with cellulitis.   CBC Lab Results  Component Value Date   WBC 7.3 07/14/2021   HGB 9.6 (L) 07/14/2021   HCT 28.2 (L) 07/14/2021   MCV 89.2 07/14/2021   PLT 217 07/14/2021    BMET    Component Value Date/Time   NA 135 07/14/2021 1347   NA 141 12/21/2020 1023  K 4.2 07/14/2021 1347   CL 102 07/14/2021 1347   CO2 23 07/14/2021 1347   GLUCOSE 164 (H) 07/14/2021 1347   BUN 31 (H) 07/14/2021 1347   BUN 51 (H) 12/21/2020 1023   CREATININE 3.05 (H) 07/14/2021 1347   CALCIUM 9.7 07/14/2021 1347   GFRNONAA 23 (L) 07/14/2021 1347   GFRAA 30 (L) 03/06/2020 1255   Estimated Creatinine Clearance: 23.8 mL/min (A) (by C-G formula based on SCr of 3.05 mg/dL (H)).  COAG Lab Results  Component Value Date   INR 1.0 07/14/2021   INR 1.1 06/01/2021    Radiology MR ANGIO HEAD WO CONTRAST  Result Date: 07/15/2021 CLINICAL DATA:  Acute stroke suspected. Left arm numbness, headache, and unsteady gait. EXAM: MRI HEAD WITHOUT CONTRAST MRA HEAD WITHOUT CONTRAST MRA OF THE NECK WITHOUT AND WITH CONTRAST TECHNIQUE: Multiplanar, multi-echo pulse sequences of the brain and surrounding structures were acquired without intravenous contrast. Angiographic images of the Circle of Willis were acquired using MRA technique without intravenous contrast. Angiographic images of the neck were acquired using MRA technique without and with intravenous contrast. Carotid stenosis measurements (when applicable) are obtained utilizing NASCET criteria,  using the distal internal carotid diameter as the denominator. CONTRAST:  39mL GADAVIST GADOBUTROL 1 MMOL/ML IV SOLN COMPARISON:  No pertinent prior exam. FINDINGS: MR HEAD FINDINGS Brain: No acute infarction, hemorrhage, hydrocephalus, extra-axial collection or mass lesion. Small area of encephalomalacia along the left occipital cortex and separately at the high left precentral gyrus, usually from infarct. Few remote hemispheric white matter insults with nonspecific pattern. Brain volume is normal Vascular: Normal flow voids Skull and upper cervical spine: Normal marrow signal Sinuses/Orbits: Negative Other: Intermittent motion artifact MRA HEAD FINDINGS Anterior circulation: Vessels are smooth and widely patent. Negative for aneurysm or vascular malformation. Posterior circulation: Vessels are smooth and widely patent. Negative for aneurysm or vascular malformation. Anatomic variants:None significant MRA NECK FINDINGS Aortic arch: 2 vessel arch which is normal diameter and smoothly contoured. Right carotid system: Smoothly contoured and diffusely patent. Left carotid system: Proximal ICA narrowing with medial outpouching measuring 3-4 mm. The more distal of 2 narrowings at this site causes 50-60% stenosis. Vertebral arteries: No proximal subclavian or vertebral stenosis. Undulating left vertebral artery but no beaded or dissection appearance. IMPRESSION: Brain MRI: 1. No acute finding. 2. Small remote cortical infarcts in the left occipital lobe and left precentral gyrus. Neck MRA: Focal irregularity and narrowing at the proximal left ICA with 50-60% stenosis and outpouching which may reflect plaque ulceration. Intracranial MRA: Negative. Electronically Signed   By: Jorje Guild M.D.   On: 07/15/2021 04:13   MR Angiogram Neck W or Wo Contrast  Result Date: 07/15/2021 CLINICAL DATA:  Acute stroke suspected. Left arm numbness, headache, and unsteady gait. EXAM: MRI HEAD WITHOUT CONTRAST MRA HEAD WITHOUT  CONTRAST MRA OF THE NECK WITHOUT AND WITH CONTRAST TECHNIQUE: Multiplanar, multi-echo pulse sequences of the brain and surrounding structures were acquired without intravenous contrast. Angiographic images of the Circle of Willis were acquired using MRA technique without intravenous contrast. Angiographic images of the neck were acquired using MRA technique without and with intravenous contrast. Carotid stenosis measurements (when applicable) are obtained utilizing NASCET criteria, using the distal internal carotid diameter as the denominator. CONTRAST:  28mL GADAVIST GADOBUTROL 1 MMOL/ML IV SOLN COMPARISON:  No pertinent prior exam. FINDINGS: MR HEAD FINDINGS Brain: No acute infarction, hemorrhage, hydrocephalus, extra-axial collection or mass lesion. Small area of encephalomalacia along the left occipital cortex and separately at the  high left precentral gyrus, usually from infarct. Few remote hemispheric white matter insults with nonspecific pattern. Brain volume is normal Vascular: Normal flow voids Skull and upper cervical spine: Normal marrow signal Sinuses/Orbits: Negative Other: Intermittent motion artifact MRA HEAD FINDINGS Anterior circulation: Vessels are smooth and widely patent. Negative for aneurysm or vascular malformation. Posterior circulation: Vessels are smooth and widely patent. Negative for aneurysm or vascular malformation. Anatomic variants:None significant MRA NECK FINDINGS Aortic arch: 2 vessel arch which is normal diameter and smoothly contoured. Right carotid system: Smoothly contoured and diffusely patent. Left carotid system: Proximal ICA narrowing with medial outpouching measuring 3-4 mm. The more distal of 2 narrowings at this site causes 50-60% stenosis. Vertebral arteries: No proximal subclavian or vertebral stenosis. Undulating left vertebral artery but no beaded or dissection appearance. IMPRESSION: Brain MRI: 1. No acute finding. 2. Small remote cortical infarcts in the left  occipital lobe and left precentral gyrus. Neck MRA: Focal irregularity and narrowing at the proximal left ICA with 50-60% stenosis and outpouching which may reflect plaque ulceration. Intracranial MRA: Negative. Electronically Signed   By: Jorje Guild M.D.   On: 07/15/2021 04:13   MR BRAIN WO CONTRAST  Result Date: 07/15/2021 CLINICAL DATA:  Acute stroke suspected. Left arm numbness, headache, and unsteady gait. EXAM: MRI HEAD WITHOUT CONTRAST MRA HEAD WITHOUT CONTRAST MRA OF THE NECK WITHOUT AND WITH CONTRAST TECHNIQUE: Multiplanar, multi-echo pulse sequences of the brain and surrounding structures were acquired without intravenous contrast. Angiographic images of the Circle of Willis were acquired using MRA technique without intravenous contrast. Angiographic images of the neck were acquired using MRA technique without and with intravenous contrast. Carotid stenosis measurements (when applicable) are obtained utilizing NASCET criteria, using the distal internal carotid diameter as the denominator. CONTRAST:  37mL GADAVIST GADOBUTROL 1 MMOL/ML IV SOLN COMPARISON:  No pertinent prior exam. FINDINGS: MR HEAD FINDINGS Brain: No acute infarction, hemorrhage, hydrocephalus, extra-axial collection or mass lesion. Small area of encephalomalacia along the left occipital cortex and separately at the high left precentral gyrus, usually from infarct. Few remote hemispheric white matter insults with nonspecific pattern. Brain volume is normal Vascular: Normal flow voids Skull and upper cervical spine: Normal marrow signal Sinuses/Orbits: Negative Other: Intermittent motion artifact MRA HEAD FINDINGS Anterior circulation: Vessels are smooth and widely patent. Negative for aneurysm or vascular malformation. Posterior circulation: Vessels are smooth and widely patent. Negative for aneurysm or vascular malformation. Anatomic variants:None significant MRA NECK FINDINGS Aortic arch: 2 vessel arch which is normal diameter and  smoothly contoured. Right carotid system: Smoothly contoured and diffusely patent. Left carotid system: Proximal ICA narrowing with medial outpouching measuring 3-4 mm. The more distal of 2 narrowings at this site causes 50-60% stenosis. Vertebral arteries: No proximal subclavian or vertebral stenosis. Undulating left vertebral artery but no beaded or dissection appearance. IMPRESSION: Brain MRI: 1. No acute finding. 2. Small remote cortical infarcts in the left occipital lobe and left precentral gyrus. Neck MRA: Focal irregularity and narrowing at the proximal left ICA with 50-60% stenosis and outpouching which may reflect plaque ulceration. Intracranial MRA: Negative. Electronically Signed   By: Jorje Guild M.D.   On: 07/15/2021 04:13   ECHOCARDIOGRAM COMPLETE  Result Date: 07/15/2021    ECHOCARDIOGRAM REPORT   Patient Name:   ONDRA DEBOARD Date of Exam: 07/15/2021 Medical Rec #:  [id number]     Height:       62.0 in Accession #:    [id number]    Weight:  142.8 lb Date of Birth:  1960-07-29     BSA:          1.657 m Patient Age:    58 years      BP:           175/82 mmHg Patient Gender: M             HR:           79 bpm. Exam Location:  [facility] Procedure: 2D Echo, Color Doppler and Cardiac Doppler Indications:     G45.9 TIA  History:         Patient has prior history of Echocardiogram examinations, most                  recent 06/02/2021. HFrEF, nonobstructive CAD, CKD,                  Signs/Symptoms:Chest Pain; Risk Factors:Hypertension and                  Diabetes.  Sonographer:     Charmayne Sheer Referring Phys:  [id number] ERIC CHEN Diagnosing Phys: Ida Rogue MD IMPRESSIONS  1. Left ventricular ejection fraction, by estimation, is 45 to 50%. The left ventricle has mildly decreased function. The left ventricle demonstrates global hypokinesis. There is mild left ventricular hypertrophy. Left ventricular diastolic parameters are consistent with Grade I diastolic dysfunction (impaired relaxation).  2. Right  ventricular systolic function is normal. The right ventricular size is normal. Tricuspid regurgitation signal is inadequate for assessing PA pressure.  3. The mitral valve is normal in structure. No evidence of mitral valve regurgitation. No evidence of mitral stenosis.  4. The aortic valve was not well visualized. Aortic valve regurgitation is not visualized. No aortic stenosis is present.  5. The inferior vena cava is normal in size with greater than 50% respiratory variability, suggesting right atrial pressure of 3 mmHg. FINDINGS  Left Ventricle: Left ventricular ejection fraction, by estimation, is 45 to 50%. The left ventricle has mildly decreased function. The left ventricle demonstrates global hypokinesis. The left ventricular internal cavity size was normal in size. There is  mild left ventricular hypertrophy. Left ventricular diastolic parameters are consistent with Grade I diastolic dysfunction (impaired relaxation). Right Ventricle: The right ventricular size is normal. No increase in right ventricular wall thickness. Right ventricular systolic function is normal. Tricuspid regurgitation signal is inadequate for assessing PA pressure. Left Atrium: Left atrial size was normal in size. Right Atrium: Right atrial size was normal in size. Pericardium: There is no evidence of pericardial effusion. Mitral Valve: The mitral valve is normal in structure. No evidence of mitral valve regurgitation. No evidence of mitral valve stenosis. MV peak gradient, 4.7 mmHg. The mean mitral valve gradient is 2.0 mmHg. Tricuspid Valve: The tricuspid valve is normal in structure. Tricuspid valve regurgitation is not demonstrated. No evidence of tricuspid stenosis. Aortic Valve: The aortic valve was not well visualized. Aortic valve regurgitation is not visualized. No aortic stenosis is present. Aortic valve mean gradient measures 2.0 mmHg. Aortic valve peak gradient measures 5.0 mmHg. Aortic valve area, by VTI measures 3.04 cm.  Pulmonic Valve: The pulmonic valve was normal in structure. Pulmonic valve regurgitation is not visualized. No evidence of pulmonic stenosis. Aorta: The aortic root is normal in size and structure. Venous: The inferior vena cava is normal in size with greater than 50% respiratory variability, suggesting right atrial pressure of 3 mmHg. IAS/Shunts: No atrial level shunt detected by color flow Doppler.  LEFT VENTRICLE PLAX 2D LVIDd:         5.29 cm      Diastology LVIDs:         4.16 cm      LV e' medial:    5.11 cm/s LV PW:         1.05 cm      LV E/e' medial:  13.6 LV IVS:        0.82 cm      LV e' lateral:   4.79 cm/s LVOT diam:     2.20 cm      LV E/e' lateral: 14.5 LV SV:         60 LV SV Index:   36 LVOT Area:     3.80 cm  LV Volumes (MOD) LV vol d, MOD A2C: 146.0 ml LV vol d, MOD A4C: 180.0 ml LV vol s, MOD A2C: 80.7 ml LV vol s, MOD A4C: 86.4 ml LV SV MOD A2C:     65.3 ml LV SV MOD A4C:     180.0 ml LV SV MOD BP:      79.7 ml RIGHT VENTRICLE RV Basal diam:  3.03 cm LEFT ATRIUM             Index        RIGHT ATRIUM           Index LA diam:        4.00 cm 2.41 cm/m   RA Area:     10.70 cm LA Vol (A2C):   46.3 ml 27.95 ml/m  RA Volume:   22.60 ml  13.64 ml/m LA Vol (A4C):   43.6 ml 26.32 ml/m LA Biplane Vol: 48.5 ml 29.28 ml/m  AORTIC VALVE                    PULMONIC VALVE AV Area (Vmax):    2.48 cm     PV Vmax:       0.80 m/s AV Area (Vmean):   2.91 cm     PV Vmean:      57.000 cm/s AV Area (VTI):     3.04 cm     PV VTI:        0.135 m AV Vmax:           112.00 cm/s  PV Peak grad:  2.6 mmHg AV Vmean:          72.300 cm/s  PV Mean grad:  1.0 mmHg AV VTI:            0.199 m AV Peak Grad:      5.0 mmHg AV Mean Grad:      2.0 mmHg LVOT Vmax:         73.10 cm/s LVOT Vmean:        55.300 cm/s LVOT VTI:          0.159 m LVOT/AV VTI ratio: 0.80  AORTA Ao Root diam: 3.10 cm MITRAL VALVE MV Area (PHT): 7.82 cm     SHUNTS MV Area VTI:   3.07 cm     Systemic VTI:  0.16 m MV Peak grad:  4.7 mmHg     Systemic  Diam: 2.20 cm MV Mean grad:  2.0 mmHg MV Vmax:       1.08 m/s MV Vmean:      64.3 cm/s MV Decel Time: 97 msec MV E velocity: 69.40 cm/s MV A velocity: 102.00 cm/s MV E/A ratio:  0.68 Ida Rogue MD  Electronically signed by Ida Rogue MD Signature Date/Time: 07/15/2021/1:31:02 PM    Final    CT HEAD CODE STROKE WO CONTRAST  Result Date: 07/14/2021 CLINICAL DATA:  Neuro deficit, acute, stroke suspected left arm numbness/HA/unstable Gait EXAM: CT HEAD WITHOUT CONTRAST TECHNIQUE: Contiguous axial images were obtained from the base of the skull through the vertex without intravenous contrast. RADIATION DOSE REDUCTION: This exam was performed according to the departmental dose-optimization program which includes automated exposure control, adjustment of the mA and/or kV according to patient size and/or use of iterative reconstruction technique. COMPARISON:  None. FINDINGS: Brain: No acute intracranial hemorrhage, mass effect, or edema. Gray-white differentiation is preserved. No extra-axial collection. Ventricles and sulci are normal in size and configuration. Vascular: No hyperdense vessel. There is mild intracranial atherosclerotic calcification at the skull base. Skull: Unremarkable. Sinuses/Orbits: No significant abnormality. Other: None. ASPECTS ([place] Stroke Program Early CT Score) - Ganglionic level infarction (caudate, lentiform nuclei, internal capsule, insula, M1-M3 cortex): 7 - Supraganglionic infarction (M4-M6 cortex): 3 Total score (0-10 with 10 being normal): 10 IMPRESSION: There is no acute intracranial hemorrhage or evidence of acute infarction. ASPECT score is 10. Electronically Signed   By: Macy Mis M.D.   On: 07/14/2021 13:36     Assessment/Plan 1. ESRD on hemodialysis [facility]) Recommend:  The patient currently has catheter based access.  Patient should have vein mapping of his arms to plan for upper extremity access creation.  The goal is to improve the quality of dialysis  therapy.  The risks, benefits and alternative therapies with respect to to the different kinds of dialysis accesee were reviewed in detail with the patient.  All questions were answered.  The patient agrees to proceed with mapping.     A total of 30 minutes was spent with this patient and greater than 50% was spent in counseling and coordination of care with the patient.  Discussion included the treatment options for vascular access crreation including indications for surgery and intervention.  Also discussed is the appropriate timing of treatment.  In addition medical therapy was discussed.  - VAS [place] UPPER EXT VEIN MAPPING (PRE-OP AVF); Future  2. Acute deep vein thrombosis (DVT) of axillary vein of left upper extremity (HCC) Vein mapping will also allow evaluation of his possible axillary DVT.  He may need venography.  Continue Eliquis for now  3. Stenosis of left carotid artery Recommend:  Given the patient's asymptomatic subcritical stenosis no further invasive testing or surgery at this time.  Duplex ultrasound shows 50-69% stenosis of the left ICA.  Continue antiplatelet therapy as prescribed Continue management of CAD, HTN and Hyperlipidemia Healthy heart diet,  encouraged exercise at least 4 times per week Follow up in 6 months with duplex ultrasound and physical exam    4. Hypertension associated with diabetes ([place]) Continue antihypertensive medications as already ordered, these medications have been reviewed and there are no changes at this time.   5. Coronary artery disease involving native coronary artery of native heart without angina pectoris Continue cardiac and antihypertensive medications as already ordered and reviewed, no changes at this time.  Continue statin as ordered and reviewed, no changes at this time  Nitrates PRN for chest pain   6. Type 2 diabetes mellitus with stage 4 chronic kidney disease, without long-term current use of insulin (HCC) Continue  hypoglycemic medications as already ordered, these medications have been reviewed and there are no changes at this time.  Hgb A1C to be monitored as already arranged by  primary service     Hortencia Pilar, MD  08/03/2021 3:21 PM

## 2021-08-04 ENCOUNTER — Encounter (INDEPENDENT_AMBULATORY_CARE_PROVIDER_SITE_OTHER): Payer: Self-pay

## 2021-08-04 ENCOUNTER — Ambulatory Visit (INDEPENDENT_AMBULATORY_CARE_PROVIDER_SITE_OTHER): Payer: Medicaid Other | Admitting: Vascular Surgery

## 2021-08-04 ENCOUNTER — Other Ambulatory Visit (INDEPENDENT_AMBULATORY_CARE_PROVIDER_SITE_OTHER): Payer: Self-pay | Admitting: Vascular Surgery

## 2021-08-04 ENCOUNTER — Other Ambulatory Visit (INDEPENDENT_AMBULATORY_CARE_PROVIDER_SITE_OTHER): Payer: Self-pay

## 2021-08-04 ENCOUNTER — Encounter (INDEPENDENT_AMBULATORY_CARE_PROVIDER_SITE_OTHER): Payer: Self-pay | Admitting: Vascular Surgery

## 2021-08-04 ENCOUNTER — Other Ambulatory Visit: Payer: Self-pay

## 2021-08-04 VITALS — BP 155/75 | HR 80 | Resp 16 | Ht 66.0 in | Wt 148.8 lb

## 2021-08-04 DIAGNOSIS — E1122 Type 2 diabetes mellitus with diabetic chronic kidney disease: Secondary | ICD-10-CM

## 2021-08-04 DIAGNOSIS — N184 Chronic kidney disease, stage 4 (severe): Secondary | ICD-10-CM

## 2021-08-04 DIAGNOSIS — I82A12 Acute embolism and thrombosis of left axillary vein: Secondary | ICD-10-CM | POA: Diagnosis not present

## 2021-08-04 DIAGNOSIS — N186 End stage renal disease: Secondary | ICD-10-CM

## 2021-08-04 DIAGNOSIS — E1159 Type 2 diabetes mellitus with other circulatory complications: Secondary | ICD-10-CM

## 2021-08-04 DIAGNOSIS — I6522 Occlusion and stenosis of left carotid artery: Secondary | ICD-10-CM

## 2021-08-04 DIAGNOSIS — I2510 Atherosclerotic heart disease of native coronary artery without angina pectoris: Secondary | ICD-10-CM

## 2021-08-04 DIAGNOSIS — I152 Hypertension secondary to endocrine disorders: Secondary | ICD-10-CM

## 2021-08-04 DIAGNOSIS — Z992 Dependence on renal dialysis: Secondary | ICD-10-CM

## 2021-08-06 ENCOUNTER — Encounter (INDEPENDENT_AMBULATORY_CARE_PROVIDER_SITE_OTHER): Payer: Self-pay | Admitting: Vascular Surgery

## 2021-08-07 DIAGNOSIS — Z833 Family history of diabetes mellitus: Secondary | ICD-10-CM

## 2021-08-07 DIAGNOSIS — I5043 Acute on chronic combined systolic (congestive) and diastolic (congestive) heart failure: Secondary | ICD-10-CM | POA: Diagnosis present

## 2021-08-07 DIAGNOSIS — Z841 Family history of disorders of kidney and ureter: Secondary | ICD-10-CM

## 2021-08-07 DIAGNOSIS — D631 Anemia in chronic kidney disease: Secondary | ICD-10-CM | POA: Diagnosis present

## 2021-08-07 DIAGNOSIS — Z79899 Other long term (current) drug therapy: Secondary | ICD-10-CM

## 2021-08-07 DIAGNOSIS — U071 COVID-19: Secondary | ICD-10-CM | POA: Diagnosis present

## 2021-08-07 DIAGNOSIS — J1282 Pneumonia due to coronavirus disease 2019: Secondary | ICD-10-CM | POA: Diagnosis present

## 2021-08-07 DIAGNOSIS — R519 Headache, unspecified: Secondary | ICD-10-CM | POA: Diagnosis not present

## 2021-08-07 DIAGNOSIS — N2581 Secondary hyperparathyroidism of renal origin: Secondary | ICD-10-CM | POA: Diagnosis present

## 2021-08-07 DIAGNOSIS — Z28310 Unvaccinated for covid-19: Secondary | ICD-10-CM

## 2021-08-07 DIAGNOSIS — Z992 Dependence on renal dialysis: Secondary | ICD-10-CM

## 2021-08-07 DIAGNOSIS — Z8249 Family history of ischemic heart disease and other diseases of the circulatory system: Secondary | ICD-10-CM

## 2021-08-07 DIAGNOSIS — N186 End stage renal disease: Secondary | ICD-10-CM | POA: Diagnosis present

## 2021-08-07 DIAGNOSIS — I161 Hypertensive emergency: Secondary | ICD-10-CM | POA: Diagnosis present

## 2021-08-07 DIAGNOSIS — J9601 Acute respiratory failure with hypoxia: Secondary | ICD-10-CM | POA: Diagnosis present

## 2021-08-07 DIAGNOSIS — E1165 Type 2 diabetes mellitus with hyperglycemia: Secondary | ICD-10-CM | POA: Diagnosis present

## 2021-08-07 DIAGNOSIS — Z7901 Long term (current) use of anticoagulants: Secondary | ICD-10-CM

## 2021-08-07 DIAGNOSIS — I428 Other cardiomyopathies: Secondary | ICD-10-CM | POA: Diagnosis present

## 2021-08-07 DIAGNOSIS — E1122 Type 2 diabetes mellitus with diabetic chronic kidney disease: Secondary | ICD-10-CM | POA: Diagnosis present

## 2021-08-07 DIAGNOSIS — I2510 Atherosclerotic heart disease of native coronary artery without angina pectoris: Secondary | ICD-10-CM | POA: Diagnosis present

## 2021-08-07 DIAGNOSIS — M5431 Sciatica, right side: Secondary | ICD-10-CM | POA: Diagnosis present

## 2021-08-07 DIAGNOSIS — A4189 Other specified sepsis: Principal | ICD-10-CM | POA: Diagnosis present

## 2021-08-07 DIAGNOSIS — I6522 Occlusion and stenosis of left carotid artery: Secondary | ICD-10-CM | POA: Diagnosis present

## 2021-08-07 DIAGNOSIS — Z794 Long term (current) use of insulin: Secondary | ICD-10-CM

## 2021-08-07 DIAGNOSIS — I132 Hypertensive heart and chronic kidney disease with heart failure and with stage 5 chronic kidney disease, or end stage renal disease: Secondary | ICD-10-CM | POA: Diagnosis present

## 2021-08-07 DIAGNOSIS — Z86718 Personal history of other venous thrombosis and embolism: Secondary | ICD-10-CM

## 2021-08-07 DIAGNOSIS — I21A1 Myocardial infarction type 2: Secondary | ICD-10-CM | POA: Diagnosis present

## 2021-08-07 DIAGNOSIS — I252 Old myocardial infarction: Secondary | ICD-10-CM

## 2021-08-07 DIAGNOSIS — F32A Depression, unspecified: Secondary | ICD-10-CM | POA: Diagnosis present

## 2021-08-07 DIAGNOSIS — Z8673 Personal history of transient ischemic attack (TIA), and cerebral infarction without residual deficits: Secondary | ICD-10-CM

## 2021-08-07 DIAGNOSIS — E785 Hyperlipidemia, unspecified: Secondary | ICD-10-CM | POA: Diagnosis present

## 2021-08-07 NOTE — ED Triage Notes (Signed)
Pt states he thinks he has the flu. Pt with strong cough noted. Pt states he is short of breath. Pt is a dialysis pt. Pt appears in distress. Information obtained with assist of rebecca spanish interpreter # [phone].

## 2021-08-08 ENCOUNTER — Other Ambulatory Visit: Payer: Self-pay

## 2021-08-08 ENCOUNTER — Inpatient Hospital Stay (HOSPITAL_COMMUNITY)
Admit: 2021-08-08 | Discharge: 2021-08-08 | Disposition: A | Discharge status: Home / Self Care | Payer: Medicaid Other | Attending: Internal Medicine | Admitting: Internal Medicine

## 2021-08-08 ENCOUNTER — Encounter: Payer: Self-pay | Admitting: Internal Medicine

## 2021-08-08 ENCOUNTER — Inpatient Hospital Stay
Admission: EM | Admit: 2021-08-08 | Discharge: 2021-08-14 | DRG: 871 | Disposition: A | Discharge status: Home / Self Care | Payer: Medicaid Other | Attending: Internal Medicine | Admitting: Internal Medicine

## 2021-08-08 ENCOUNTER — Emergency Department: Payer: Medicaid Other

## 2021-08-08 DIAGNOSIS — N186 End stage renal disease: Secondary | ICD-10-CM | POA: Diagnosis present

## 2021-08-08 DIAGNOSIS — D631 Anemia in chronic kidney disease: Secondary | ICD-10-CM | POA: Diagnosis present

## 2021-08-08 DIAGNOSIS — I161 Hypertensive emergency: Secondary | ICD-10-CM | POA: Diagnosis present

## 2021-08-08 DIAGNOSIS — J1282 Pneumonia due to coronavirus disease 2019: Secondary | ICD-10-CM | POA: Diagnosis present

## 2021-08-08 DIAGNOSIS — Z7901 Long term (current) use of anticoagulants: Secondary | ICD-10-CM | POA: Diagnosis not present

## 2021-08-08 DIAGNOSIS — I10 Essential (primary) hypertension: Secondary | ICD-10-CM

## 2021-08-08 DIAGNOSIS — I132 Hypertensive heart and chronic kidney disease with heart failure and with stage 5 chronic kidney disease, or end stage renal disease: Secondary | ICD-10-CM | POA: Diagnosis present

## 2021-08-08 DIAGNOSIS — F32A Depression, unspecified: Secondary | ICD-10-CM | POA: Diagnosis present

## 2021-08-08 DIAGNOSIS — I214 Non-ST elevation (NSTEMI) myocardial infarction: Secondary | ICD-10-CM | POA: Diagnosis not present

## 2021-08-08 DIAGNOSIS — E785 Hyperlipidemia, unspecified: Secondary | ICD-10-CM

## 2021-08-08 DIAGNOSIS — Z992 Dependence on renal dialysis: Secondary | ICD-10-CM

## 2021-08-08 DIAGNOSIS — E1165 Type 2 diabetes mellitus with hyperglycemia: Secondary | ICD-10-CM

## 2021-08-08 DIAGNOSIS — Z28310 Unvaccinated for covid-19: Secondary | ICD-10-CM | POA: Diagnosis not present

## 2021-08-08 DIAGNOSIS — U071 COVID-19: Secondary | ICD-10-CM | POA: Diagnosis present

## 2021-08-08 DIAGNOSIS — N185 Chronic kidney disease, stage 5: Secondary | ICD-10-CM | POA: Diagnosis present

## 2021-08-08 DIAGNOSIS — N2581 Secondary hyperparathyroidism of renal origin: Secondary | ICD-10-CM | POA: Diagnosis present

## 2021-08-08 DIAGNOSIS — I5022 Chronic systolic (congestive) heart failure: Secondary | ICD-10-CM

## 2021-08-08 DIAGNOSIS — I248 Other forms of acute ischemic heart disease: Secondary | ICD-10-CM | POA: Diagnosis present

## 2021-08-08 DIAGNOSIS — E1122 Type 2 diabetes mellitus with diabetic chronic kidney disease: Secondary | ICD-10-CM | POA: Diagnosis present

## 2021-08-08 DIAGNOSIS — I2510 Atherosclerotic heart disease of native coronary artery without angina pectoris: Secondary | ICD-10-CM | POA: Diagnosis present

## 2021-08-08 DIAGNOSIS — R531 Weakness: Secondary | ICD-10-CM

## 2021-08-08 DIAGNOSIS — I6522 Occlusion and stenosis of left carotid artery: Secondary | ICD-10-CM | POA: Diagnosis present

## 2021-08-08 DIAGNOSIS — I5043 Acute on chronic combined systolic (congestive) and diastolic (congestive) heart failure: Secondary | ICD-10-CM | POA: Diagnosis present

## 2021-08-08 DIAGNOSIS — A419 Sepsis, unspecified organism: Secondary | ICD-10-CM | POA: Diagnosis present

## 2021-08-08 DIAGNOSIS — R778 Other specified abnormalities of plasma proteins: Secondary | ICD-10-CM

## 2021-08-08 DIAGNOSIS — I428 Other cardiomyopathies: Secondary | ICD-10-CM | POA: Diagnosis present

## 2021-08-08 DIAGNOSIS — Z86718 Personal history of other venous thrombosis and embolism: Secondary | ICD-10-CM | POA: Diagnosis not present

## 2021-08-08 DIAGNOSIS — R739 Hyperglycemia, unspecified: Secondary | ICD-10-CM

## 2021-08-08 DIAGNOSIS — I21A1 Myocardial infarction type 2: Secondary | ICD-10-CM | POA: Diagnosis present

## 2021-08-08 DIAGNOSIS — Z794 Long term (current) use of insulin: Secondary | ICD-10-CM | POA: Diagnosis not present

## 2021-08-08 DIAGNOSIS — Z8673 Personal history of transient ischemic attack (TIA), and cerebral infarction without residual deficits: Secondary | ICD-10-CM | POA: Diagnosis not present

## 2021-08-08 DIAGNOSIS — J9601 Acute respiratory failure with hypoxia: Secondary | ICD-10-CM | POA: Diagnosis present

## 2021-08-08 DIAGNOSIS — I509 Heart failure, unspecified: Secondary | ICD-10-CM

## 2021-08-08 DIAGNOSIS — R0602 Shortness of breath: Secondary | ICD-10-CM | POA: Diagnosis present

## 2021-08-08 DIAGNOSIS — A4189 Other specified sepsis: Secondary | ICD-10-CM | POA: Diagnosis present

## 2021-08-08 LAB — RESP PANEL BY RT-PCR (FLU A&B, COVID) ARPGX2
Influenza A by PCR: NEGATIVE
Influenza B by PCR: NEGATIVE
SARS Coronavirus 2 by RT PCR: POSITIVE — AB

## 2021-08-08 LAB — CBC WITH DIFFERENTIAL/PLATELET
Abs Immature Granulocytes: 0.05 10*3/uL (ref 0.00–0.07)
Basophils Absolute: 0 10*3/uL (ref 0.0–0.1)
Basophils Relative: 0 %
Eosinophils Absolute: 0.1 10*3/uL (ref 0.0–0.5)
Eosinophils Relative: 1 %
HCT: 28.9 % — ABNORMAL LOW (ref 39.0–52.0)
Hemoglobin: 9.4 g/dL — ABNORMAL LOW (ref 13.0–17.0)
Immature Granulocytes: 1 %
Lymphocytes Relative: 7 %
Lymphs Abs: 0.5 10*3/uL — ABNORMAL LOW (ref 0.7–4.0)
MCH: 29 pg (ref 26.0–34.0)
MCHC: 32.5 g/dL (ref 30.0–36.0)
MCV: 89.2 fL (ref 80.0–100.0)
Monocytes Absolute: 0.5 10*3/uL (ref 0.1–1.0)
Monocytes Relative: 7 %
Neutro Abs: 6 10*3/uL (ref 1.7–7.7)
Neutrophils Relative %: 84 %
Platelets: 223 10*3/uL (ref 150–400)
RBC: 3.24 MIL/uL — ABNORMAL LOW (ref 4.22–5.81)
RDW: 12.5 % (ref 11.5–15.5)
WBC: 7.3 10*3/uL (ref 4.0–10.5)
nRBC: 0 % (ref 0.0–0.2)

## 2021-08-08 LAB — COMPREHENSIVE METABOLIC PANEL
ALT: 25 U/L (ref 0–44)
AST: 27 U/L (ref 15–41)
Albumin: 3.8 g/dL (ref 3.5–5.0)
Alkaline Phosphatase: 131 U/L — ABNORMAL HIGH (ref 38–126)
Anion gap: 7 (ref 5–15)
BUN: 35 mg/dL — ABNORMAL HIGH (ref 6–20)
CO2: 23 mmol/L (ref 22–32)
Calcium: 8.8 mg/dL — ABNORMAL LOW (ref 8.9–10.3)
Chloride: 103 mmol/L (ref 98–111)
Creatinine, Ser: 3.81 mg/dL — ABNORMAL HIGH (ref 0.61–1.24)
GFR, Estimated: 18 mL/min — ABNORMAL LOW (ref >60)
Glucose, Bld: 504 mg/dL (ref 70–99)
Potassium: 4.6 mmol/L (ref 3.5–5.1)
Sodium: 133 mmol/L — ABNORMAL LOW (ref 135–145)
Total Bilirubin: 1 mg/dL (ref 0.3–1.2)
Total Protein: 7.4 g/dL (ref 6.5–8.1)

## 2021-08-08 LAB — BRAIN NATRIURETIC PEPTIDE: B Natriuretic Peptide: 2160.8 pg/mL — ABNORMAL HIGH (ref 0.0–100.0)

## 2021-08-08 LAB — PROCALCITONIN: Procalcitonin: 0.15 ng/mL

## 2021-08-08 LAB — LIPID PANEL
Cholesterol: 90 mg/dL (ref 0–200)
HDL: 35 mg/dL — ABNORMAL LOW (ref >40)
LDL Cholesterol: 46 mg/dL (ref 0–99)
Total CHOL/HDL Ratio: 2.6 RATIO
Triglycerides: 47 mg/dL (ref <150)
VLDL: 9 mg/dL (ref 0–40)

## 2021-08-08 LAB — TROPONIN I (HIGH SENSITIVITY)
Troponin I (High Sensitivity): 983 ng/L (ref <18)
Troponin I (High Sensitivity): 3577 ng/L (ref <18)

## 2021-08-08 LAB — PROTIME-INR
INR: 1.2 (ref 0.8–1.2)
Prothrombin Time: 15.4 seconds — ABNORMAL HIGH (ref 11.4–15.2)

## 2021-08-08 LAB — CBG MONITORING, ED: Glucose-Capillary: 317 mg/dL — ABNORMAL HIGH (ref 70–99)

## 2021-08-08 LAB — GLUCOSE, CAPILLARY
Glucose-Capillary: 132 mg/dL — ABNORMAL HIGH (ref 70–99)
Glucose-Capillary: 91 mg/dL (ref 70–99)
Glucose-Capillary: 153 mg/dL — ABNORMAL HIGH (ref 70–99)
Glucose-Capillary: 60 mg/dL — ABNORMAL LOW (ref 70–99)
Glucose-Capillary: 85 mg/dL (ref 70–99)

## 2021-08-08 LAB — ECHOCARDIOGRAM LIMITED
Height: 66 in
S' Lateral: 4.13 cm
Weight: 2405.66 oz

## 2021-08-08 LAB — APTT
aPTT: 33 seconds (ref 24–36)
aPTT: 77 seconds — ABNORMAL HIGH (ref 24–36)
aPTT: 57 seconds — ABNORMAL HIGH (ref 24–36)

## 2021-08-08 LAB — LACTIC ACID, PLASMA: Lactic Acid, Venous: 0.7 mmol/L (ref 0.5–1.9)

## 2021-08-08 LAB — HEPARIN LEVEL (UNFRACTIONATED): Heparin Unfractionated: >1.1 IU/mL — ABNORMAL HIGH (ref 0.30–0.70)

## 2021-08-08 MED ORDER — ROSUVASTATIN CALCIUM 20 MG PO TABS
40.0000 mg | ORAL_TABLET | Freq: Every day | ORAL | Status: DC
  Administered 2021-08-08: 40 mg via ORAL
  Filled 2021-08-08: qty 2

## 2021-08-08 MED ORDER — ASPIRIN 81 MG PO CHEW
324.0000 mg | CHEWABLE_TABLET | Freq: Once | ORAL | Status: AC
  Administered 2021-08-08: 324 mg via ORAL
  Filled 2021-08-08: qty 4

## 2021-08-08 MED ORDER — ACETAMINOPHEN 325 MG PO TABS
650.0000 mg | ORAL_TABLET | ORAL | Status: DC | PRN
  Administered 2021-08-08 – 2021-08-13 (×4): 650 mg via ORAL
  Filled 2021-08-08 (×6): qty 2

## 2021-08-08 MED ORDER — INSULIN ASPART 100 UNIT/ML IJ SOLN: 0.0000 [IU] | Freq: Every day | INTRAMUSCULAR | Status: DC

## 2021-08-08 MED ORDER — LIDOCAINE HCL (PF) 1 % IJ SOLN: 5.0000 mL | INTRAMUSCULAR | Status: DC | PRN

## 2021-08-08 MED ORDER — SODIUM CHLORIDE 0.9 % IV SOLN: 100.0000 mg | Freq: Every day | INTRAVENOUS | Status: DC

## 2021-08-08 MED ORDER — LACTATED RINGERS IV SOLN: INTRAVENOUS | Status: DC

## 2021-08-08 MED ORDER — CHLORHEXIDINE GLUCONATE CLOTH 2 % EX PADS
6.0000 | MEDICATED_PAD | Freq: Every day | CUTANEOUS | Status: DC
  Administered 2021-08-09 – 2021-08-14 (×7): 6 via TOPICAL

## 2021-08-08 MED ORDER — VANCOMYCIN HCL 1500 MG/300ML IV SOLN
1500.0000 mg | Freq: Once | INTRAVENOUS | Status: AC
  Administered 2021-08-08: 1500 mg via INTRAVENOUS
  Filled 2021-08-08: qty 300

## 2021-08-08 MED ORDER — ALBUTEROL SULFATE HFA 108 (90 BASE) MCG/ACT IN AERS
2.0000 | INHALATION_SPRAY | Freq: Four times a day (QID) | RESPIRATORY_TRACT | Status: DC
  Administered 2021-08-08 – 2021-08-14 (×25): 2 via RESPIRATORY_TRACT
  Filled 2021-08-08: qty 6.7

## 2021-08-08 MED ORDER — ACETAMINOPHEN 500 MG PO TABS
1000.0000 mg | ORAL_TABLET | Freq: Once | ORAL | Status: AC
  Administered 2021-08-08: 1000 mg via ORAL
  Filled 2021-08-08: qty 2

## 2021-08-08 MED ORDER — HYDRALAZINE HCL 50 MG PO TABS
100.0000 mg | ORAL_TABLET | Freq: Three times a day (TID) | ORAL | Status: DC
  Administered 2021-08-08 – 2021-08-14 (×18): 100 mg via ORAL
  Filled 2021-08-08 (×19): qty 2

## 2021-08-08 MED ORDER — HEPARIN SODIUM (PORCINE) 1000 UNIT/ML IJ SOLN
INTRAMUSCULAR | Status: AC
  Filled 2021-08-08: qty 10

## 2021-08-08 MED ORDER — HEPARIN BOLUS VIA INFUSION
3800.0000 [IU] | Freq: Once | INTRAVENOUS | Status: AC
  Administered 2021-08-08: 3800 [IU] via INTRAVENOUS
  Filled 2021-08-08: qty 3800

## 2021-08-08 MED ORDER — HEPARIN (PORCINE) 25000 UT/250ML-% IV SOLN
900.0000 [IU]/h | INTRAVENOUS | Status: AC
  Administered 2021-08-08: 750 [IU]/h via INTRAVENOUS
  Administered 2021-08-09: 900 [IU]/h via INTRAVENOUS
  Filled 2021-08-08 (×2): qty 250

## 2021-08-08 MED ORDER — GUAIFENESIN-DM 100-10 MG/5ML PO SYRP
10.0000 mL | ORAL_SOLUTION | ORAL | Status: DC | PRN
  Administered 2021-08-10 – 2021-08-14 (×2): 10 mL via ORAL
  Filled 2021-08-08 (×2): qty 10

## 2021-08-08 MED ORDER — METRONIDAZOLE 500 MG/100ML IV SOLN
500.0000 mg | Freq: Once | INTRAVENOUS | Status: AC
  Administered 2021-08-08: 500 mg via INTRAVENOUS
  Filled 2021-08-08: qty 100

## 2021-08-08 MED ORDER — INSULIN ASPART 100 UNIT/ML IJ SOLN
5.0000 [IU] | Freq: Once | INTRAMUSCULAR | Status: AC
  Administered 2021-08-08: 5 [IU] via SUBCUTANEOUS
  Filled 2021-08-08: qty 1

## 2021-08-08 MED ORDER — SODIUM CHLORIDE 0.9 % IV SOLN: 200.0000 mg | Freq: Once | INTRAVENOUS | Status: DC

## 2021-08-08 MED ORDER — INSULIN ASPART 100 UNIT/ML IJ SOLN
10.0000 [IU] | Freq: Once | INTRAMUSCULAR | Status: AC
  Administered 2021-08-08: 10 [IU] via INTRAVENOUS
  Filled 2021-08-08: qty 1

## 2021-08-08 MED ORDER — SODIUM CHLORIDE 0.9 % IV SOLN: 100.0000 mL | INTRAVENOUS | Status: DC | PRN

## 2021-08-08 MED ORDER — ISOSORBIDE MONONITRATE ER 30 MG PO TB24
30.0000 mg | ORAL_TABLET | Freq: Every day | ORAL | Status: DC
Start: 2021-08-08 — End: 2021-08-14
  Administered 2021-08-08 – 2021-08-14 (×6): 30 mg via ORAL
  Filled 2021-08-08 (×6): qty 1

## 2021-08-08 MED ORDER — INSULIN ASPART 100 UNIT/ML IJ SOLN
0.0000 [IU] | Freq: Three times a day (TID) | INTRAMUSCULAR | Status: DC
  Administered 2021-08-08 – 2021-08-09 (×3): 2 [IU] via SUBCUTANEOUS
  Filled 2021-08-08 (×3): qty 1

## 2021-08-08 MED ORDER — VANCOMYCIN HCL IN DEXTROSE 1-5 GM/200ML-% IV SOLN: 1000.0000 mg | Freq: Once | INTRAVENOUS | Status: DC

## 2021-08-08 MED ORDER — FUROSEMIDE 10 MG/ML IJ SOLN
40.0000 mg | Freq: Two times a day (BID) | INTRAMUSCULAR | Status: DC
  Administered 2021-08-08 – 2021-08-09 (×4): 40 mg via INTRAVENOUS
  Filled 2021-08-08 (×4): qty 4

## 2021-08-08 MED ORDER — SODIUM CHLORIDE 0.9 % IV SOLN
100.0000 mg | Freq: Every day | INTRAVENOUS | Status: AC
  Administered 2021-08-09 – 2021-08-10 (×2): 100 mg via INTRAVENOUS
  Filled 2021-08-08 (×2): qty 20

## 2021-08-08 MED ORDER — CARVEDILOL 25 MG PO TABS
25.0000 mg | ORAL_TABLET | Freq: Two times a day (BID) | ORAL | Status: DC
  Administered 2021-08-08 – 2021-08-14 (×13): 25 mg via ORAL
  Filled 2021-08-08 (×14): qty 1

## 2021-08-08 MED ORDER — LIDOCAINE-PRILOCAINE 2.5-2.5 % EX CREA: 1.0000 "application " | TOPICAL_CREAM | CUTANEOUS | Status: DC | PRN

## 2021-08-08 MED ORDER — HEPARIN BOLUS VIA INFUSION
950.0000 [IU] | Freq: Once | INTRAVENOUS | Status: AC
  Administered 2021-08-08: 950 [IU] via INTRAVENOUS
  Filled 2021-08-08: qty 950

## 2021-08-08 MED ORDER — ASPIRIN EC 81 MG PO TBEC
81.0000 mg | DELAYED_RELEASE_TABLET | Freq: Every day | ORAL | Status: DC
  Administered 2021-08-09 – 2021-08-14 (×5): 81 mg via ORAL
  Filled 2021-08-08 (×7): qty 1

## 2021-08-08 MED ORDER — SODIUM CHLORIDE 0.9 % IV SOLN
200.0000 mg | Freq: Once | INTRAVENOUS | Status: AC
  Administered 2021-08-08: 200 mg via INTRAVENOUS
  Filled 2021-08-08: qty 200

## 2021-08-08 MED ORDER — ALTEPLASE 2 MG IJ SOLR: 2.0000 mg | Freq: Once | INTRAMUSCULAR | Status: DC | PRN

## 2021-08-08 MED ORDER — SODIUM CHLORIDE 0.9 % IV SOLN
2.0000 g | Freq: Once | INTRAVENOUS | Status: AC
  Administered 2021-08-08: 2 g via INTRAVENOUS
  Filled 2021-08-08: qty 2

## 2021-08-08 MED ORDER — HEPARIN SODIUM (PORCINE) 1000 UNIT/ML DIALYSIS: 1000.0000 [IU] | INTRAMUSCULAR | Status: DC | PRN

## 2021-08-08 MED ORDER — HYDROCOD POLI-CHLORPHE POLI ER 10-8 MG/5ML PO SUER
5.0000 mL | Freq: Two times a day (BID) | ORAL | Status: DC | PRN
  Administered 2021-08-09 – 2021-08-14 (×3): 5 mL via ORAL
  Filled 2021-08-08 (×4): qty 5

## 2021-08-08 MED ORDER — ATORVASTATIN CALCIUM 80 MG PO TABS
80.0000 mg | ORAL_TABLET | Freq: Every day | ORAL | Status: DC
  Administered 2021-08-09 – 2021-08-14 (×6): 80 mg via ORAL
  Filled 2021-08-08 (×6): qty 1

## 2021-08-08 MED ORDER — NITROGLYCERIN 0.4 MG SL SUBL: 0.4000 mg | SUBLINGUAL_TABLET | SUBLINGUAL | Status: DC | PRN

## 2021-08-08 MED ORDER — PENTAFLUOROPROP-TETRAFLUOROETH EX AERO: 1.0000 "application " | INHALATION_SPRAY | CUTANEOUS | Status: DC | PRN

## 2021-08-08 MED ORDER — ONDANSETRON HCL 4 MG/2ML IJ SOLN: 4.0000 mg | Freq: Four times a day (QID) | INTRAMUSCULAR | Status: DC | PRN

## 2021-08-08 MED ORDER — INSULIN DETEMIR 100 UNIT/ML ~~LOC~~ SOLN
20.0000 [IU] | Freq: Every day | SUBCUTANEOUS | Status: DC
  Administered 2021-08-08: 20 [IU] via SUBCUTANEOUS
  Filled 2021-08-08 (×2): qty 0.2

## 2021-08-08 MED ORDER — FUROSEMIDE 10 MG/ML IJ SOLN
40.0000 mg | Freq: Once | INTRAMUSCULAR | Status: AC
  Administered 2021-08-08: 40 mg via INTRAVENOUS
  Filled 2021-08-08: qty 4

## 2021-08-08 NOTE — ED Provider Notes (Signed)
[facility] Provider Note    Event Date/Time   First MD Initiated Contact with Patient 08/08/21 0008     (approximate)   History   Shortness of Breath   HPI  Jimmy Olson is a 61 y.o. male with history of end-stage renal disease on hemodialysis, hypertension, diabetes, hyperlipidemia, nonischemic cardiomyopathy, CAD who presents to the emergency department with complaints of fevers, chills, chest tightness, shortness of breath, cough for the past day.  He states he thinks he has the flu.  He has had a flu vaccine but has not been vaccinated against COVID-19.  No sick contacts or recent travel.  States he receives dialysis through a tunneled catheter in the right chest on Monday, Wednesday and Friday.  Was last dialyzed Friday and denies missing any dialysis sessions.  He denies any history of PE or DVT.  No lower extremity swelling or pain.  States he was recently admitted to the hospital for heart attack and a stroke.  States he does not wear oxygen at home.  States he has only been on dialysis since his admission in December 2022.  Reports he still makes urine.   History provided by patient and wife using Spanish interpreter.    Past Medical History:  Diagnosis Date   Acute on chronic HFrEF (heart failure with reduced ejection fraction) ([place]) 07/15/2020   ATN (acute tubular necrosis) (HCC)    Chronic HFrEF (heart failure with reduced ejection fraction) ([place])    a. 06/2020 Echo: EF 30-35%, glob HK, GrII DD, nl RV fxn. Mild LAE.   Chronic kidney disease (CKD), stage IV (severe) ([place])    COVID-19 virus infection 02/2020   Demand ischemia (HCC)    Diabetes mellitus without complication ([employer])    Elevated troponin 07/15/2020   Hyperkalemia 12/23/2020   Hypertension    NICM (nonischemic cardiomyopathy) ([place])    a. 07/2009 MV: EF 48%; b. 08/2019 Echo: EF 45-50%. Global HK. Mod LVH. GrIDD; c. 06/2020 Echo: EF 30-35%, glob HK, mild LVH, GrII DD, nl RV fxn, mild LAE; d.  06/2020 Cath: nonobs dzs.   Nonobstructive CAD (coronary artery disease)    a. 07/2009 MV: EF 48%. No ischemia; b. 06/2020 Cath: LM min irregs, LAD 40p, 12m, D1 70, D2 min irregs, D3 nl, RI min irregs, LCX small, mild diff dzs, RCA 20p, 38m, 50d, RPAV 50-->Med Rx.   NSTEMI (non-ST elevated myocardial infarction) [facility])     Past Surgical History:  Procedure Laterality Date   APPENDECTOMY     CARDIAC CATHETERIZATION     DIALYSIS/PERMA CATHETER INSERTION N/A 06/02/2021   Procedure: DIALYSIS/PERMA CATHETER INSERTION;  Surgeon: Algernon Huxley, MD;  Location: [place] [facility];  Service: Cardiovascular;  Laterality: N/A;   LEFT HEART CATH AND CORONARY ANGIOGRAPHY N/A 06/03/2021   Procedure: LEFT HEART CATH AND CORONARY ANGIOGRAPHY;  Surgeon: Wellington Hampshire, MD;  Location: [place] [facility];  Service: Cardiovascular;  Laterality: N/A;   NASAL SINUS SURGERY     RIGHT/LEFT HEART CATH AND CORONARY ANGIOGRAPHY N/A 07/19/2020   Procedure: RIGHT/LEFT HEART CATH AND CORONARY ANGIOGRAPHY;  Surgeon: Wellington Hampshire, MD;  Location: [place] [facility];  Service: Cardiovascular;  Laterality: N/A;    MEDICATIONS:  Prior to Admission medications   Medication Sig Start Date End Date Taking? Authorizing Provider  acetaminophen (TYLENOL) 325 MG tablet Take 325 mg by mouth every 6 (six) hours as needed. Patient not taking: Reported on 08/04/2021 09/20/20   [provider]  albuterol (PROVENTIL HFA) 108 (90 Base) MCG/ACT inhaler INHALE 2 PUFFS INTO THE LUNGS ONCE EVERY 6 HOURS AS NEEDED FOR WHEEZING OR SHORTNESS OF BREATH. Patient not taking: Reported on 08/04/2021 07/13/21     apixaban (ELIQUIS) 5 MG TABS tablet TAKE 1 TABLET BY MOUTH TWICE DAILY. 07/11/21     carvedilol (COREG) 25 MG tablet Take 1 tablet (25 mg total) by mouth 2 (two) times daily. 07/25/21   Minna Merritts, MD  cetirizine (ZYRTEC) 10 MG tablet Take 10 mg by mouth daily as needed for allergies.    [provider]  D3-50 1.25  MG (50000 UT) capsule Take 50,000 Units by mouth once a week. Patient not taking: Reported on 08/04/2021 10/21/20   [provider]  furosemide (LASIX) 80 MG tablet Take 80 mg daily as needed on nondialysis days for swelling or shortness of breath Patient not taking: Reported on 08/04/2021 07/25/21   Minna Merritts, MD  gabapentin (NEURONTIN) 300 MG capsule Take 1 capsule (300 mg total) by mouth at bedtime for 7 days. 06/07/21 07/25/21  Sidney Ace, MD  HUMALOG KWIKPEN 100 UNIT/ML KwikPen Inject 3 Units into the skin 3 (three) times daily. 07/11/21   [provider]  hydrALAZINE (APRESOLINE) 100 MG tablet Take 1 tablet (100 mg total) by mouth once every 8 (eight) hours. 07/25/21   Minna Merritts, MD  insulin detemir (LEVEMIR FLEXTOUCH) 100 UNIT/ML FlexPen Inject 20 Units into the skin once daily at bedtime. 07/12/21 08/11/21    Insulin Pen Needle 32G X 4 MM MISC USE AS DIRECTED 06/07/21   Ralene Muskrat B, MD  isosorbide mononitrate (IMDUR) 30 MG 24 hr tablet Take 1 tablet (30 mg total) by mouth in the morning and at bedtime. 07/25/21   Minna Merritts, MD  nitroGLYCERIN (NITROSTAT) 0.4 MG SL tablet Place 1 tablet (0.4 mg total) under the tongue every 5 (five) minutes as needed for chest pain. 07/25/20   Jennye Boroughs, MD  rosuvastatin (CRESTOR) 40 MG tablet TAKE 1 TABLET BY MOUTH ONCE DAILY. 07/25/21   Minna Merritts, MD  atorvastatin (LIPITOR) 80 MG tablet Take 1 tablet (80 mg total) by mouth daily. 06/07/21 07/11/21  Sidney Ace, MD  isosorbide dinitrate (ISORDIL) 30 MG tablet TAKE 1 TABLET BY MOUTH ONCE DAILY. 07/12/21 07/13/21      Physical Exam   Triage Vital Signs: ED Triage Vitals  Enc Vitals Group     BP 08/08/21 0003 (!) 206/106     Pulse Rate 08/08/21 0000 (!) 122     Resp 08/08/21 0000 (!) 26     Temp 08/08/21 0002 (!) 100.8 F (38.2 C)     Temp Source 08/08/21 0000 Oral     SpO2 08/08/21 0000 90 %     Weight 08/08/21 0000 140 lb (63.5 kg)      Height 08/08/21 0000 5\' 6"  (1.676 m)     Head Circumference --      Peak Flow --      Pain Score --      Pain Loc --      Pain Edu? --      Excl. in [place]? --     Most recent vital signs: Vitals:   08/08/21 0137 08/08/21 0200  BP: (!) 183/99 (!) 181/90  Pulse: 98 92  Resp:  (!) 22  Temp:  99.8 F (37.7 C)  SpO2: 94% 98%    CONSTITUTIONAL: Alert and oriented and responds appropriately to questions.  Febrile,  in mild respiratory distress, chronically ill-appearing HEAD: Normocephalic, atraumatic EYES: Conjunctivae clear, pupils appear equal, sclera nonicteric ENT: normal nose; moist mucous membranes NECK: Supple, normal ROM CARD: Regular and tachycardic, S1 and S2 appreciated; no murmurs, no clicks, no rubs, no gallops, tunneled catheter in the right chest wall without surrounding redness, warmth or tenderness RESP: Patient is tachypneic.  No wheezing, rhonchi or rales appreciated.  He is speaking truncated sentences.  Hypoxic to the mid 80s on room air at rest. ABD/GI: Normal bowel sounds; non-distended; soft, non-tender, no rebound, no guarding, no peritoneal signs BACK: The back appears normal EXT: Normal ROM in all joints; no deformity noted, no edema; no cyanosis, no calf tenderness or calf swelling SKIN: Normal color for age and race; warm; no rash on exposed skin NEURO: Moves all extremities equally, normal speech PSYCH: The patient's mood and manner are appropriate.   ED Results / Procedures / Treatments   LABS: (all labs ordered are listed, but only abnormal results are displayed) Labs Reviewed  RESP PANEL BY RT-PCR (FLU A&B, COVID) ARPGX2 - Abnormal; Notable for the following components:      Result Value   SARS Coronavirus 2 by RT PCR POSITIVE (*)    All other components within normal limits  COMPREHENSIVE METABOLIC PANEL - Abnormal; Notable for the following components:   Sodium 133 (*)    Glucose, Bld 504 (*)    BUN 35 (*)    Creatinine, Ser 3.81 (*)     Calcium 8.8 (*)    Alkaline Phosphatase 131 (*)    GFR, Estimated 18 (*)    All other components within normal limits  CBC WITH DIFFERENTIAL/PLATELET - Abnormal; Notable for the following components:   RBC 3.24 (*)    Hemoglobin 9.4 (*)    HCT 28.9 (*)    Lymphs Abs 0.5 (*)    All other components within normal limits  PROTIME-INR - Abnormal; Notable for the following components:   Prothrombin Time 15.4 (*)    All other components within normal limits  BRAIN NATRIURETIC PEPTIDE - Abnormal; Notable for the following components:   B Natriuretic Peptide 2,160.8 (*)    All other components within normal limits  HEPARIN LEVEL (UNFRACTIONATED) - Abnormal; Notable for the following components:   Heparin Unfractionated >1.10 (*)    All other components within normal limits  CBG MONITORING, ED - Abnormal; Notable for the following components:   Glucose-Capillary 317 (*)    All other components within normal limits  TROPONIN I (HIGH SENSITIVITY) - Abnormal; Notable for the following components:   Troponin I (High Sensitivity) 983 (*)    All other components within normal limits  CULTURE, BLOOD (ROUTINE X 2)  CULTURE, BLOOD (ROUTINE X 2)  LACTIC ACID, PLASMA  PROCALCITONIN  APTT  URINALYSIS, ROUTINE W REFLEX MICROSCOPIC  CBG MONITORING, ED  TROPONIN I (HIGH SENSITIVITY)     EKG:  EKG Interpretation  Date/Time:  Monday August 08 2021 02:15:27 EST Ventricular Rate:  89 PR Interval:  154 QRS Duration: 103 QT Interval:  352 QTC Calculation: 429 R Axis:   24 Text Interpretation: Sinus rhythm LVH with secondary repolarization abnormality Confirmed by Pryor Curia [phone]) on 08/08/2021 2:48:03 AM         RADIOLOGY: My personal review and interpretation of imaging: Chest x-ray shows pulmonary edema.  I have personally reviewed all radiology reports.   DG Chest 1 View  Result Date: 08/08/2021 CLINICAL DATA:  Cough and fevers EXAM: PORTABLE CHEST 1 VIEW  COMPARISON:  05/31/2021  FINDINGS: Cardiac shadow is enlarged but stable. Dialysis catheter is noted in satisfactory position. Mild vascular congestion is again noted the related to fluid overload. Interstitial edema is noted. No bony abnormality is seen. IMPRESSION: CHF with interstitial edema stable from the prior exam Electronically Signed   By: Inez Catalina M.D.   On: 08/08/2021 00:30     PROCEDURES:  Critical Care performed: Yes, see critical care procedure note(s)   CRITICAL CARE Performed by: Pryor Curia   Total critical care time: 65 minutes  Critical care time was exclusive of separately billable procedures and treating other patients.  Critical care was necessary to treat or prevent imminent or life-threatening deterioration.  Critical care was time spent personally by me on the following activities: development of treatment plan with patient and/or surrogate as well as nursing, discussions with consultants, evaluation of patient's response to treatment, examination of patient, obtaining history from patient or surrogate, ordering and performing treatments and interventions, ordering and review of laboratory studies, ordering and review of radiographic studies, pulse oximetry and re-evaluation of patient's condition.   Marland Kitchen1-3 Lead EKG Interpretation Performed by: Swan Zayed, Delice Bison, DO Authorized by: Corie Allis, Delice Bison, DO     Interpretation: abnormal     ECG rate:  112   ECG rate assessment: tachycardic     Rhythm: sinus tachycardia     Ectopy: none     Conduction: normal      IMPRESSION / MDM / ASSESSMENT AND PLAN / ED COURSE  I reviewed the triage vital signs and the nursing notes.    Patient here with complaints of fevers, cough, chest pain and shortness of breath.  Febrile, tachycardic, tachypneic and hypoxic here.  The patient is on the cardiac monitor to evaluate for evidence of arrhythmia and/or significant heart rate changes.   DIFFERENTIAL DIAGNOSIS (includes but not limited to):    COVID-19, influenza, pneumonia, CHF, PE, pneumothorax, bacteremia, UTI, sepsis, other viral URI, ACS, dissection   PLAN: We will obtain CBC, CMP, troponin, BNP, chest x-ray, urinalysis, cultures, COVID and flu swabs.  Will give broad-spectrum antibiotics and start gentle IV hydration.  He is hypertensive and not in septic shock therefore we will hold 30 mL/kg IV fluid bolus at this time given he is on dialysis.  Given his new oxygen requirement, patient will need admission.   Patient is also at risk for PE but he is on Eliquis.  He has recently had a DVT of the right upper extremity.  Unfortunately not able to get a CTA of his chest given he has only been on dialysis for about 3 months.  MEDICATIONS GIVEN IN ED: Medications  vancomycin (VANCOREADY) IVPB 1500 mg/300 mL (1,500 mg Intravenous New Bag/Given 08/08/21 0146)  heparin ADULT infusion 100 units/mL (25000 units/251mL) (750 Units/hr Intravenous New Bag/Given 08/08/21 0246)  remdesivir 200 mg in sodium chloride 0.9% 250 mL IVPB (200 mg Intravenous New Bag/Given 08/08/21 0252)    Followed by  remdesivir 100 mg in sodium chloride 0.9 % 100 mL IVPB (has no administration in time range)  ceFEPIme (MAXIPIME) 2 g in sodium chloride 0.9 % 100 mL IVPB (0 g Intravenous Stopped 08/08/21 0244)  metroNIDAZOLE (FLAGYL) IVPB 500 mg (0 mg Intravenous Stopped 08/08/21 0147)  acetaminophen (TYLENOL) tablet 1,000 mg (1,000 mg Oral Given 08/08/21 0114)  furosemide (LASIX) injection 40 mg (40 mg Intravenous Given 08/08/21 0157)  insulin aspart (novoLOG) injection 10 Units (10 Units Intravenous Given 08/08/21 0156)  aspirin chewable tablet 324  mg (324 mg Oral Given 08/08/21 0247)  heparin bolus via infusion 3,800 Units (3,800 Units Intravenous Bolus from Bag 08/08/21 0246)     ED COURSE: Patient's labs show no leukocytosis or leukopenia.  He is anemic which is stable compared to labs about a month ago.  He is hyperglycemic but not in DKA.  Normal potassium.  Troponin  elevated at 983.  BNP is 2160.  Normal lactic.  Procalcitonin is 0.15.  He is positive for COVID-19.  Will give remdesivir.  We will hold steroids at this time for hypoxia in the setting of COVID-19 given his hyperglycemia and will discuss with hospitalist.  Chest x-ray reviewed by myself and radiologist and shows pulmonary edema but no infiltrate or pneumothorax.  Patient not having chest pain currently.  Will give aspirin and start heparin for NSTEMI which I think is due to demand ischemia from sepsis from COVID-19.  Will give IV insulin for his hyperglycemia and watch his blood sugar closely.  Holding on IV fluids at this time given pulmonary edema seen on chest x-ray and no hypotension or elevated lactic.  He is getting IV Lasix as he still makes urine.  Will discuss with hospitalist for admission.  His heart rate and respiratory rate have improved and he appears more comfortable at this time.  Patient and wife updated with Spanish interpreter.   CONSULTS:  Consulted and discussed patient's case with hospitalist, Dr. Damita Dunnings.  I have recommended admission and consulting physician agrees and will place admission orders.  Patient (and family if present) agree with this plan.   I reviewed all nursing notes, vitals, pertinent previous records.  All labs, EKGs, imaging ordered have been independently reviewed and interpreted by myself.    OUTSIDE RECORDS REVIEWED: Reviewed patient's recent admission to the hospital 07/14/2021 to 07/15/2021 for concerns for TIA.  Patient on Eliquis.  Also reviewed his admission from 05/31/2021 to 06/07/2021 for CHF exacerbation and respiratory failure requiring BiPAP, hypertensive emergency, right axillary DVT, NSTEMI due to decompensated heart failure, worsening renal function requiring hemodialysis.       FINAL CLINICAL IMPRESSION(S) / ED DIAGNOSES   Final diagnoses:  COVID-19  Acute on chronic congestive heart failure, unspecified heart failure type (HCC)   NSTEMI (non-ST elevated myocardial infarction) ([place])  Acute respiratory failure with hypoxia ([place])  Acute sepsis ([place])  Hyperglycemia     Rx / DC Orders   ED Discharge Orders     None        Note:  This document was prepared using Dragon voice recognition software and may include unintentional dictation errors.   Cheng Dec, Delice Bison, DO 08/08/21 [phone]

## 2021-08-08 NOTE — Progress Notes (Signed)
ANTICOAGULATION CONSULT NOTE - Initial Consult  Pharmacy Consult for Heparin  Indication: chest pain/ACS  No Known Allergies  Patient Measurements: Height: 5\' 6"  (167.6 cm) Weight: 63.5 kg (140 lb) IBW/kg (Calculated) : 63.8 Heparin Dosing Weight: 63.5 kg   Vital Signs: Temp: 99.8 F (37.7 C) (02/20 0200) Temp Source: Oral (02/20 0200) BP: 181/90 (02/20 0200) Pulse Rate: 92 (02/20 0200)  Labs: Recent Labs    08/08/21 0004 08/08/21 0023  HGB 9.4*  --   HCT 28.9*  --   PLT 223  --   LABPROT 15.4*  --   INR 1.2  --   CREATININE 3.81*  --   TROPONINIHS  --  983*    Estimated Creatinine Clearance: 19 mL/min (A) (by C-G formula based on SCr of 3.81 mg/dL (H)).   Medical History: Past Medical History:  Diagnosis Date   Acute on chronic HFrEF (heart failure with reduced ejection fraction) ([place]) 07/15/2020   ATN (acute tubular necrosis) (HCC)    Chronic HFrEF (heart failure with reduced ejection fraction) ([place])    a. 06/2020 Echo: EF 30-35%, glob HK, GrII DD, nl RV fxn. Mild LAE.   Chronic kidney disease (CKD), stage IV (severe) ([place])    COVID-19 virus infection 02/2020   Demand ischemia (HCC)    Diabetes mellitus without complication ([place])    Elevated troponin 07/15/2020   Hyperkalemia 12/23/2020   Hypertension    NICM (nonischemic cardiomyopathy) ([place])    a. 07/2009 MV: EF 48%; b. 08/2019 Echo: EF 45-50%. Global HK. Mod LVH. GrIDD; c. 06/2020 Echo: EF 30-35%, glob HK, mild LVH, GrII DD, nl RV fxn, mild LAE; d. 06/2020 Cath: nonobs dzs.   Nonobstructive CAD (coronary artery disease)    a. 07/2009 MV: EF 48%. No ischemia; b. 06/2020 Cath: LM min irregs, LAD 40p, 27m, D1 70, D2 min irregs, D3 nl, RI min irregs, LCX small, mild diff dzs, RCA 20p, 66m, 50d, RPAV 50-->Med Rx.   NSTEMI (non-ST elevated myocardial infarction) ([place])     Medications:  (Not in a hospital admission)   Assessment: Pharmacy consulted to dose heparin in this 61 year old male admitted for ACS/NSTEMI.  Pt  was on Eliquis 5 mg PO BID PTA for DVT tx,  last dose on 2/19 AM.  CrCl = 19 ml/min   Goal of Therapy:  Heparin level 0.3-0.7 units/ml aPTT 66 - 102 seconds Monitor platelets by anticoagulation protocol: Yes   Plan:  Will order heparin 3800 units IV X 1 bolus and increase drip rate to 750 units/hr.   Will use aPTT to dose heparin until HL and aPTT correlate. Will draw aPTT 8 hrs after start of drip. Will check HL and CBC daily.   Pearlie Nies D 08/08/2021,2:17 AM

## 2021-08-08 NOTE — Progress Notes (Signed)
CODE SEPSIS - PHARMACY COMMUNICATION  **Broad Spectrum Antibiotics should be administered within 1 hour of Sepsis diagnosis**  Time Code Sepsis Called/Page Received:  2/20 @ 0023  Antibiotics Ordered: Metro, Cefepime, Vanc   Time of 1st antibiotic administration: Metronidazole 500 mg IV X 1 0112   Additional action taken by pharmacy:   If necessary, Name of Provider/Nurse Contacted:     Kyndall Amero D ,PharmD Clinical Pharmacist  08/08/2021  2:04 AM

## 2021-08-08 NOTE — Assessment & Plan Note (Addendum)
Blood sugar over 500, given IV insulin 10 units in the ED.   Sugars have improved. Continue basal Levemir Sliding scale insulin

## 2021-08-08 NOTE — Progress Notes (Signed)
Remdesivir - Pharmacy Brief Note   O:  ALT: 25  CXR:  SpO2: 98 % on RA   A/P:  Remdesivir 200 mg IVPB once followed by 100 mg IVPB daily x 4 days.   Omauri Boeve D 08/08/2021 2:26 AM

## 2021-08-08 NOTE — Assessment & Plan Note (Addendum)
Hemoglobin stable and at baseline.  Continue to monitor

## 2021-08-08 NOTE — Assessment & Plan Note (Addendum)
Patient is denying chest pain and EKG nonacute but with troponin [phone], Could be related to demand ischemia versus type I NSTEMI Initially placed on heparin infusion. Continue rosuvastatin, isosorbide Two-vessel CAD on [facility] 06/2020 Cardiology consulted. Continue ASA, Lipitor, Coreg, Imdur Resumed Eliquis No further cardiac testing or intervention at this time.  Cardiology signed off.  Outpatient follow up.

## 2021-08-08 NOTE — H&P (Addendum)
History and Physical    Patient: Jimmy Olson DOB: [birth year] DOA: 08/08/2021 DOS: the patient was seen and examined on 08/08/2021 PCP: [facility] [place]  Patient coming from: Home  Chief Complaint:  Chief Complaint  Patient presents with   Shortness of Breath    HPI: Jimmy Olson is a 61 y.o. male with medical history significant for insulin-dependent type 2 diabetes   (EF 45 to 50%, G1DD 07/15/21), ESRD initiated on HD 05/2021, anemia of CKD,  two-vessel CAD on [facility] 06/2020, depression, DVT of axillary vein 05/2021 on Eliquis, recently hospitalized from 1/26 to 1/27 with a TIA, with MRA neck showing ulcerated plaque proximal left ICA with 50 to 60% stenosis, who presents to the ED with a 2-day history of nonproductive cough, nasal congestion and shortness of breath.  He is vaccinated against influenza but not COVID.  Denies chest pain and denies missing dialysis.  Denies lower extremity edema.  Denies fever or chills, abdominal pain, vomiting or diarrhea.  ED course: Tmax 100.8 with heart rate 122 on arrival, tachypneic to 26 with BP 208/106, O2 sat 87% on room air Blood work: Troponin [phone], BNP 2160 WBC 7.3 with lactic acid 0.7, procalcitonin 0.15 Blood glucose 504 Hemoglobin 9.4 COVID-positive  EKG, personally viewed and interpreted: Sinus at 89 with no acute ST-T wave changes  Imaging: Chest x-ray with CHF with interstitial edema stable from prior exam  Patient started on IV heparin and given a chewable aspirin for NSTEMI.  Also started on remdesivir, vancomycin, cefepime and metronidazole.  Given IV insulin, 10 units.  Hospitalist consulted for admission.  Data reviewed:  Relevant notes from primary care and specialist visits, past discharge summaries available in EHR, including Care Everywhere. Prior diagnostic testing as pertinent to current admission diagnoses Updated medications and problem lists for reconciliation ED course, including  vitals, labs, imaging, treatment and response to treatment Triage notes and ED providers notes Notable results as noted in HPI   Review of Systems: As mentioned in the history of present illness. All other systems reviewed and are negative. Past Medical History:  Diagnosis Date   Acute on chronic HFrEF (heart failure with reduced ejection fraction) ([place]) 07/15/2020   ATN (acute tubular necrosis) (HCC)    Chronic HFrEF (heart failure with reduced ejection fraction) ([place])    a. 06/2020 Echo: EF 30-35%, glob HK, GrII DD, nl RV fxn. Mild LAE.   Chronic kidney disease (CKD), stage IV (severe) ([place])    COVID-19 virus infection 02/2020   Demand ischemia (HCC)    Diabetes mellitus without complication ([place])    Elevated troponin 07/15/2020   Hyperkalemia 12/23/2020   Hypertension    NICM (nonischemic cardiomyopathy) ([place])    a. 07/2009 MV: EF 48%; b. 08/2019 Echo: EF 45-50%. Global HK. Mod LVH. GrIDD; c. 06/2020 Echo: EF 30-35%, glob HK, mild LVH, GrII DD, nl RV fxn, mild LAE; d. 06/2020 Cath: nonobs dzs.   Nonobstructive CAD (coronary artery disease)    a. 07/2009 MV: EF 48%. No ischemia; b. 06/2020 Cath: LM min irregs, LAD 40p, 1m, D1 70, D2 min irregs, D3 nl, RI min irregs, LCX small, mild diff dzs, RCA 20p, 77m, 50d, RPAV 50-->Med Rx.   NSTEMI (non-ST elevated myocardial infarction) [facility])    Past Surgical History:  Procedure Laterality Date   APPENDECTOMY     CARDIAC CATHETERIZATION     DIALYSIS/PERMA CATHETER INSERTION N/A 06/02/2021   Procedure: DIALYSIS/PERMA CATHETER INSERTION;  Surgeon: Algernon Huxley, MD;  Location: [place] [facility];  Service: Cardiovascular;  Laterality: N/A;   LEFT HEART CATH AND CORONARY ANGIOGRAPHY N/A 06/03/2021   Procedure: LEFT HEART CATH AND CORONARY ANGIOGRAPHY;  Surgeon: Wellington Hampshire, MD;  Location: [place] [facility];  Service: Cardiovascular;  Laterality: N/A;   NASAL SINUS SURGERY     RIGHT/LEFT HEART CATH AND CORONARY ANGIOGRAPHY N/A 07/19/2020    Procedure: RIGHT/LEFT HEART CATH AND CORONARY ANGIOGRAPHY;  Surgeon: Wellington Hampshire, MD;  Location: [place] [facility];  Service: Cardiovascular;  Laterality: N/A;   Social History:  reports that he has never smoked. He has never used smokeless tobacco. He reports that he does not drink alcohol and does not use drugs.  No Known Allergies  Family History  Problem Relation Age of Onset   Kidney failure Mother        died @ 81   Heart failure Mother    Other Father        he never knew his father   Diabetes Brother     Prior to Admission medications   Medication Sig Start Date End Date Taking? Authorizing Provider  acetaminophen (TYLENOL) 325 MG tablet Take 325 mg by mouth every 6 (six) hours as needed. Patient not taking: Reported on 08/04/2021 09/20/20   [provider]  albuterol (PROVENTIL HFA) 108 (90 Base) MCG/ACT inhaler INHALE 2 PUFFS INTO THE LUNGS ONCE EVERY 6 HOURS AS NEEDED FOR WHEEZING OR SHORTNESS OF BREATH. Patient not taking: Reported on 08/04/2021 07/13/21     apixaban (ELIQUIS) 5 MG TABS tablet TAKE 1 TABLET BY MOUTH TWICE DAILY. 07/11/21     carvedilol (COREG) 25 MG tablet Take 1 tablet (25 mg total) by mouth 2 (two) times daily. 07/25/21   Minna Merritts, MD  cetirizine (ZYRTEC) 10 MG tablet Take 10 mg by mouth daily as needed for allergies.    [provider]  D3-50 1.25 MG (50000 UT) capsule Take 50,000 Units by mouth once a week. Patient not taking: Reported on 08/04/2021 10/21/20   [provider]  furosemide (LASIX) 80 MG tablet Take 80 mg daily as needed on nondialysis days for swelling or shortness of breath Patient not taking: Reported on 08/04/2021 07/25/21   Minna Merritts, MD  gabapentin (NEURONTIN) 300 MG capsule Take 1 capsule (300 mg total) by mouth at bedtime for 7 days. 06/07/21 07/25/21  Sidney Ace, MD  HUMALOG KWIKPEN 100 UNIT/ML KwikPen Inject 3 Units into the skin 3 (three) times daily. 07/11/21   [provider]  hydrALAZINE (APRESOLINE) 100 MG tablet Take 1 tablet (100 mg total) by mouth once every 8 (eight) hours. 07/25/21   Minna Merritts, MD  insulin detemir (LEVEMIR FLEXTOUCH) 100 UNIT/ML FlexPen Inject 20 Units into the skin once daily at bedtime. 07/12/21 08/11/21    Insulin Pen Needle 32G X 4 MM MISC USE AS DIRECTED 06/07/21   Ralene Muskrat B, MD  isosorbide mononitrate (IMDUR) 30 MG 24 hr tablet Take 1 tablet (30 mg total) by mouth in the morning and at bedtime. 07/25/21   Minna Merritts, MD  nitroGLYCERIN (NITROSTAT) 0.4 MG SL tablet Place 1 tablet (0.4 mg total) under the tongue every 5 (five) minutes as needed for chest pain. 07/25/20   Jennye Boroughs, MD  rosuvastatin (CRESTOR) 40 MG tablet TAKE 1 TABLET BY MOUTH ONCE DAILY. 07/25/21   Minna Merritts, MD  atorvastatin (LIPITOR) 80 MG tablet Take 1 tablet (80 mg total) by mouth daily.  06/07/21 07/11/21  Sidney Ace, MD  isosorbide dinitrate (ISORDIL) 30 MG tablet TAKE 1 TABLET BY MOUTH ONCE DAILY. 07/12/21 07/13/21      Physical Exam: Vitals:   08/08/21 0050 08/08/21 0055 08/08/21 0137 08/08/21 0200  BP:   (!) 183/99 (!) 181/90  Pulse:   98 92  Resp:    (!) 22  Temp:    99.8 F (37.7 C)  TempSrc:    Oral  SpO2: (!) 87% 94% 94% 98%  Weight:      Height:       Physical Exam Vitals and nursing note reviewed.  Constitutional:      General: He is not in acute distress.    Appearance: Normal appearance. He is ill-appearing.  HENT:     Head: Normocephalic and atraumatic.  Cardiovascular:     Rate and Rhythm: Normal rate and regular rhythm.     Pulses: Normal pulses.     Heart sounds: Normal heart sounds. No murmur heard. Pulmonary:     Effort: Pulmonary effort is normal. Tachypnea present.     Breath sounds: Rales present. No wheezing or rhonchi.  Abdominal:     General: Bowel sounds are normal.     Palpations: Abdomen is soft.     Tenderness: There is no abdominal tenderness.  Musculoskeletal:        General: No  swelling or tenderness. Normal range of motion.     Cervical back: Normal range of motion and neck supple.     Right lower leg: No edema.     Left lower leg: No edema.  Skin:    General: Skin is warm and dry.  Neurological:     General: No focal deficit present.     Mental Status: He is alert. Mental status is at baseline.  Psychiatric:        Mood and Affect: Mood normal.        Behavior: Behavior normal.       Assessment and Plan: * Acute on chronic combined systolic and diastolic CHF (congestive heart failure) (HCC) Secondary to hypertensive emergency, possible primary ACS.  No missed dialysis sessions IV Lasix Continue home carvedilol, hydralazine, Imdur Daily weights with intake and output monitoring Blood pressure control Expecting further improvement with dialysis EF 45 to 50% with G1 DD on 07/15/2021,  NSTEMI (non-ST elevated myocardial infarction) [facility])- (present on admission) Patient is denying chest pain and EKG nonacute but with troponin [phone], Could be related to demand ischemia versus type I NSTEMI Heparin infusion Not currently on aspirin likely due to systemic anticoagulation Continue rosuvastatin, isosorbide Two-vessel CAD on [facility] 06/2020 Cardiology consult to evaluate for need for further restratification We will get repeat echo to evaluate for focal wall motion abnormality  Uncontrolled type 2 diabetes mellitus with hyperglycemia, with long-term current use of insulin (HCC) Blood sugar over 500, receiving IV insulin 10 units in the ED Continue basal Levemir Sliding scale insulin  Acute respiratory failure with hypoxia ([place]) Suspect mostly related to CHF than to COVID Patient with shortness of breath O2 sat 87% on room air, tachypnea with increased work of breathing Supplemental O2 to keep sats over 94% Treat COVID Antibiotics not continued for possible bacterial pneumonia given procalcitonin of 0.15  Hypertensive emergency BP 208/106 on arrival To  address medication compliance will improve Resuming home antihypertensives of carvedilol, hydralazine, Imdur Receiving IV Lasix  COVID-19 virus infection Was negative for COVID on 07/14/2021 so recent infection suspected Symptomatic for cough and shortness of  breath with nasal congestion but no fever.  Chest x-ray more consistent with CHF Continue remdesivir Antitussives, albuterol IV steroids not initiated at this time as hypoxia is mild and believed mostly related to CHF rather than COVID-pneumonia Continue to reassess for consideration of steroid  Anemia of chronic kidney failure, stage 5 (HCC) Hemoglobin stable and at baseline.  Continue to monitor   Chronic anticoagulation As above.  Eliquis on hold  History of DVT (deep vein thrombosis) Axillary DVT in December 2022 Holding Eliquis due to IV insulin infusion   ESRD on hemodialysis [facility]) Initiated on hemodialysis in December 2022 Nephrology consult for continuation of dialysis       Advance Care Planning:   Code Status: Prior   Consults: cardiology, renal  Family Communication: Wife at bedside  Severity of Illness: The appropriate patient status for this patient is INPATIENT. Inpatient status is judged to be reasonable and necessary in order to provide the required intensity of service to ensure the patient's safety. The patient's presenting symptoms, physical exam findings, and initial radiographic and laboratory data in the context of their chronic comorbidities is felt to place them at high risk for further clinical deterioration. Furthermore, it is not anticipated that the patient will be medically stable for discharge from the hospital within 2 midnights of admission.   * I certify that at the point of admission it is my clinical judgment that the patient will require inpatient hospital care spanning beyond 2 midnights from the point of admission due to high intensity of service, high risk for further deterioration  and high frequency of surveillance required.*  Author: Athena Masse, MD 08/08/2021 3:09 AM  For on call review [email or web address].

## 2021-08-08 NOTE — Assessment & Plan Note (Addendum)
BP 208/106 on arrival BP improved on home regimen and with dialysis and volume removal. On carvedilol, hydralazine, Imdur

## 2021-08-08 NOTE — Assessment & Plan Note (Addendum)
Was negative for COVID on 07/14/2021 so recent infection suspected Symptomatic for cough and shortness of breath with nasal congestion but no fever.  Chest x-ray more consistent with CHF Completed remdesivir Antitussives, albuterol Steroids were deferred as it was felt hypoxia was mild and believed mostly related to CHF rather than COVID-pneumonia.  Now on room air.

## 2021-08-08 NOTE — Progress Notes (Signed)
[facility]  ROUNDING NOTE   Subjective:   Jimmy Olson is a 62 year old Hispanic male with past medical conditions including anemia, two-vessel CAD, diabetes, depression, DVT with Eliquis, and end-stage renal disease on hemodialysis.  Patient presents to the emergency department with complaints of shortness of breath, cough and nasal congestion.  Patient was admitted for Hyperglycemia [R73.9] NSTEMI (non-ST elevated myocardial infarction) ([place]) [I21.4] Acute respiratory failure with hypoxia (HCC) [J96.01] Acute on chronic congestive heart failure, unspecified heart failure type ([place]) [I50.9] Acute sepsis ([place]) [A41.9] COVID-19 [U07.1]  Patient is known to our practice and receives outpatient dialysis treatments at [facility] on a MWF schedule, supervised by Dr. Candiss Norse.  Patient reports last treatment received on Friday.  Patient states he has been experiencing the symptoms for the past couple days.  Currently on 2.5 L nasal cannula, room air baseline.  Denies missed treatments.  Denies nausea, vomiting, and diarrhea.  Denies fever or chills.  Denies chest pain and other discomfort.  Patient was found to be COVID-positive on admission.  Patient has not been vaccinated against COVID but did receive influenza vaccine.  Labs on admission include sodium 133, glucose 504, BUN 35, and creatinine 3.81 with GFR 18.  Chest x-ray shows no acute changes.   Objective:  Vital signs in last 24 hours:  Temp:  [98.1 F (36.7 C)-100.8 F (38.2 C)] 98.1 F (36.7 C) (02/20 1158) Pulse Rate:  [81-122] 83 (02/20 1158) Resp:  [15-27] 18 (02/20 1158) BP: (142-206)/(80-106) 142/81 (02/20 1158) SpO2:  [87 %-100 %] 98 % (02/20 1158) Weight:  [63.5 kg-68.2 kg] 68.2 kg (02/20 0500)  Weight change:  Filed Weights   08/08/21 0000 08/08/21 0500  Weight: 63.5 kg 68.2 kg    Intake/Output: I/O last 3 completed shifts: In: 37 [P.O.:50; I.V.:25] Out: 400 [Urine:400]   Intake/Output  this shift:  Total I/O In: 477 [P.O.:477] Out: 1025 [Urine:1025]  Physical Exam: General: NAD  Head: Normocephalic, atraumatic. Moist oral mucosal membranes  Eyes: Anicteric  Lungs:  Rales, normal effort, 2.5L [place]  Heart: Regular rate and rhythm  Abdomen:  Soft, nontender  Extremities:  1+ peripheral edema.  Neurologic: Nonfocal, moving all four extremities  Skin: No lesions  Access: Right IJ PermCath    Basic Metabolic Panel: Recent Labs  Lab 08/08/21 0004  NA 133*  K 4.6  CL 103  CO2 23  GLUCOSE 504*  BUN 35*  CREATININE 3.81*  CALCIUM 8.8*    Liver Function Tests: Recent Labs  Lab 08/08/21 0004  AST 27  ALT 25  ALKPHOS 131*  BILITOT 1.0  PROT 7.4  ALBUMIN 3.8   No results for input(s): LIPASE, AMYLASE in the last 168 hours. No results for input(s): AMMONIA in the last 168 hours.  CBC: Recent Labs  Lab 08/08/21 0004  WBC 7.3  NEUTROABS 6.0  HGB 9.4*  HCT 28.9*  MCV 89.2  PLT 223    Cardiac Enzymes: No results for input(s): CKTOTAL, CKMB, CKMBINDEX, TROPONINI in the last 168 hours.  BNP: Invalid input(s): POCBNP  CBG: Recent Labs  Lab 08/08/21 0250 08/08/21 0802 08/08/21 1201  GLUCAP 317* 132* 33    Microbiology: Results for orders placed or performed during the hospital encounter of 08/08/21  Culture, blood (Routine x 2)     Status: None (Preliminary result)   Collection Time: 08/08/21 12:04 AM   Specimen: Left Antecubital; Blood  Result Value Ref Range Status   Specimen Description LEFT ANTECUBITAL  Final  Special Requests   Final    BOTTLES DRAWN AEROBIC AND ANAEROBIC Blood Culture results may not be optimal due to an excessive volume of blood received in culture bottles   Culture   Final    NO GROWTH < 12 HOURS Performed at [facility], [address], [place], [place] [address]    Report Status PENDING  Incomplete  Resp Panel by RT-PCR (Flu A&B, Covid) Nasopharyngeal Swab     Status: Abnormal   Collection Time:  08/08/21 12:13 AM   Specimen: Nasopharyngeal Swab; Nasopharyngeal(NP) swabs in vial transport medium  Result Value Ref Range Status   SARS Coronavirus 2 by RT PCR POSITIVE (A) NEGATIVE Final    Comment: (NOTE) SARS-CoV-2 target nucleic acids are DETECTED.  The SARS-CoV-2 RNA is generally detectable in upper respiratory specimens during the acute phase of infection. Positive results are indicative of the presence of the identified virus, but do not rule out bacterial infection or co-infection with other pathogens not detected by the test. Clinical correlation with patient history and other diagnostic information is necessary to determine patient infection status. The expected result is Negative.  Fact Sheet for Patients: EntrepreneurPulse.com.au  Fact Sheet for Healthcare Providers: IncredibleEmployment.be  This test is not yet approved or cleared by the [place] FDA and  has been authorized for detection and/or diagnosis of SARS-CoV-2 by FDA under an Emergency Use Authorization (EUA).  This EUA will remain in effect (meaning this test can be used) for the duration of  the COVID-19 declaration under Section 564(b)(1) of the A ct, 21 U.S.C. section 360bbb-3(b)(1), unless the authorization is terminated or revoked sooner.     Influenza A by PCR NEGATIVE NEGATIVE Final   Influenza B by PCR NEGATIVE NEGATIVE Final    Comment: (NOTE) The Xpert Xpress SARS-CoV-2/FLU/RSV plus assay is intended as an aid in the diagnosis of influenza from Nasopharyngeal swab specimens and should not be used as a sole basis for treatment. Nasal washings and aspirates are unacceptable for Xpert Xpress SARS-CoV-2/FLU/RSV testing.  Fact Sheet for Patients: EntrepreneurPulse.com.au  Fact Sheet for Healthcare Providers: IncredibleEmployment.be  This test is not yet approved or cleared by the [place] FDA and has been  authorized for detection and/or diagnosis of SARS-CoV-2 by FDA under an Emergency Use Authorization (EUA). This EUA will remain in effect (meaning this test can be used) for the duration of the COVID-19 declaration under Section 564(b)(1) of the Act, 21 U.S.C. section 360bbb-3(b)(1), unless the authorization is terminated or revoked.  Performed at [facility], [address] [place], [place] [address]   Culture, blood (Routine x 2)     Status: None (Preliminary result)   Collection Time: 08/08/21  1:50 AM   Specimen: BLOOD  Result Value Ref Range Status   Specimen Description BLOOD LEFT  Final   Special Requests   Final    BOTTLES DRAWN AEROBIC AND ANAEROBIC Blood Culture adequate volume   Culture   Final    NO GROWTH < 12 HOURS Performed at [facility], [address], [place], [place] [address]    Report Status PENDING  Incomplete    Coagulation Studies: Recent Labs    08/08/21 0004  LABPROT 15.4*  INR 1.2    Urinalysis: No results for input(s): COLORURINE, LABSPEC, PHURINE, GLUCOSEU, HGBUR, BILIRUBINUR, KETONESUR, PROTEINUR, UROBILINOGEN, NITRITE, LEUKOCYTESUR in the last 72 hours.  Invalid input(s): APPERANCEUR    Imaging: DG Chest 1 View  Result Date: 08/08/2021 CLINICAL DATA:  Cough and  fevers EXAM: PORTABLE CHEST 1 VIEW COMPARISON:  05/31/2021 FINDINGS: Cardiac shadow is enlarged but stable. Dialysis catheter is noted in satisfactory position. Mild vascular congestion is again noted the related to fluid overload. Interstitial edema is noted. No bony abnormality is seen. IMPRESSION: CHF with interstitial edema stable from the prior exam Electronically Signed   By: Inez Catalina M.D.   On: 08/08/2021 00:30   ECHOCARDIOGRAM LIMITED  Result Date: 08/08/2021    ECHOCARDIOGRAM LIMITED REPORT   Patient Name:   Jimmy Olson Date of Exam: 08/08/2021 Medical Rec #:  [id number]     Height:       66.0 in Accession #:    [id number]    Weight:       150.4 lb  Date of Birth:  23-Jun-1960     BSA:          1.771 m Patient Age:    60 years      BP:           152/87 mmHg Patient Gender: M             HR:           84 bpm. Exam Location:  [facility] Procedure: Limited Echo, Limited Color Doppler, Cardiac Doppler and Strain            Analysis Indications:     I21.4 NSTEMI  History:         Patient has prior history of Echocardiogram examinations, most                  recent 07/15/2021. HFrEF, CKD, stage IV; Risk                  Factors:Hypertension and Diabetes. Pt tested positive for                  COVID-19 on 08/08/21.  Sonographer:     Charmayne Sheer Referring Phys:  [id number] Athena Masse Diagnosing Phys: [name] MD  Sonographer Comments: Global longitudinal strain was attempted. IMPRESSIONS  1. Left ventricular ejection fraction, by estimation, is 40 to 45%. The left ventricle has mildly decreased function. The left ventricle demonstrates global hypokinesis. The left ventricular internal cavity size was mildly dilated. There is mild left ventricular hypertrophy. Left ventricular diastolic function could not be evaluated. The average left ventricular global longitudinal strain is -8.6 %. The global longitudinal strain is abnormal.  2. Right ventricular systolic function is normal. The right ventricular size is normal. Tricuspid regurgitation signal is inadequate for assessing PA pressure.  3. Left atrial size was mildly dilated.  4. The mitral valve is normal in structure. No evidence of mitral valve regurgitation. No evidence of mitral stenosis.  5. The aortic valve is normal in structure. Aortic valve regurgitation is not visualized. Aortic valve sclerosis is present, with no evidence of aortic valve stenosis.  6. The inferior vena cava is normal in size with greater than 50% respiratory variability, suggesting right atrial pressure of 3 mmHg. FINDINGS  Left Ventricle: Left ventricular ejection fraction, by estimation, is 40 to 45%. The left ventricle has mildly decreased  function. The left ventricle demonstrates global hypokinesis. The average left ventricular global longitudinal strain is -8.6 %. The  global longitudinal strain is abnormal. The left ventricular internal cavity size was mildly dilated. There is mild left ventricular hypertrophy. Left ventricular diastolic function could not be evaluated. Right Ventricle: The right ventricular size is normal. No increase in right ventricular wall  thickness. Right ventricular systolic function is normal. Tricuspid regurgitation signal is inadequate for assessing PA pressure. Left Atrium: Left atrial size was mildly dilated. Right Atrium: Right atrial size was normal in size. Pericardium: There is no evidence of pericardial effusion. Mitral Valve: The mitral valve is normal in structure. Mild mitral annular calcification. No evidence of mitral valve stenosis. Tricuspid Valve: The tricuspid valve is normal in structure. Tricuspid valve regurgitation is not demonstrated. No evidence of tricuspid stenosis. Aortic Valve: The aortic valve is normal in structure. Aortic valve regurgitation is not visualized. Aortic valve sclerosis is present, with no evidence of aortic valve stenosis. Pulmonic Valve: The pulmonic valve was normal in structure. Pulmonic valve regurgitation is not visualized. No evidence of pulmonic stenosis. Aorta: The aortic root is normal in size and structure. Venous: The inferior vena cava is normal in size with greater than 50% respiratory variability, suggesting right atrial pressure of 3 mmHg. IAS/Shunts: No atrial level shunt detected by color flow Doppler. LEFT VENTRICLE PLAX 2D LVIDd:         5.26 cm LVIDs:         4.13 cm 2D Longitudinal Strain LV PW:         1.38 cm 2D Strain GLS Avg:     -8.6 % LV IVS:        1.05 cm  LEFT ATRIUM         Index LA diam:    4.40 cm 2.48 cm/m [name] MD Electronically signed by [name] MD Signature Date/Time: 08/08/2021/10:23:08 AM    Final      Medications:     heparin 750 Units/hr (08/08/21 0246)   [START ON 08/09/2021] remdesivir 100 mg in NS 100 mL      heparin sodium (porcine)       albuterol  2 puff Inhalation Q6H   [START ON 08/09/2021] aspirin EC  81 mg Oral Daily   [START ON 08/09/2021] atorvastatin  80 mg Oral Daily   carvedilol  25 mg Oral BID WC   Chlorhexidine Gluconate Cloth  6 each Topical Q0600   furosemide  40 mg Intravenous BID   hydrALAZINE  100 mg Oral Q8H   insulin aspart  0-15 Units Subcutaneous TID WC   insulin aspart  0-5 Units Subcutaneous QHS   insulin detemir  20 Units Subcutaneous QHS   isosorbide mononitrate  30 mg Oral Daily   acetaminophen, chlorpheniramine-HYDROcodone, guaiFENesin-dextromethorphan, nitroGLYCERIN, ondansetron (ZOFRAN) IV  Assessment/ Plan:  Jimmy Olson is a 61 y.o.  male with past medical conditions including anemia, two-vessel CAD, diabetes, depression, DVT with Eliquis, and end-stage renal disease on hemodialysis.  Patient presents to the emergency department with complaints of shortness of breath, cough and nasal congestion.  Patient was admitted for Hyperglycemia [R73.9] NSTEMI (non-ST elevated myocardial infarction) ([place]) [I21.4] Acute respiratory failure with hypoxia (HCC) [J96.01] Acute on chronic congestive heart failure, unspecified heart failure type ([place]) [I50.9] Acute sepsis ([place]) [A41.9] COVID-19 [U07.1]  [facility]/MWF/right IJ PermCath  End-stage renal disease on hemodialysis.  Will maintain outpatient schedule if possible.  Plans to dialyze patient this afternoon with UF goal 1 L as tolerated.  Next treatment scheduled for Wednesday.  2. Anemia of chronic kidney disease  Normocytic Lab Results  Component Value Date   HGB 9.4 (L) 08/08/2021  Hemoglobin remains within acceptable range, will continue to monitor.  Micera received outpatient  3. Secondary Hyperparathyroidism:  Lab Results  Component Value Date   CALCIUM 8.8 (L)  08/08/2021   PHOS 5.3 (H)  06/05/2021  Calcium and phosphorus remain within acceptable range.  We will continue to monitor.  4. 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 Home regimen includes Humalog and Levemir. Most recent hemoglobin A1c is 10.1 on 07/14/21.  Glucose stable  5.  Hypertension with chronic kidney disease.  Home regimen includes furosemide, carvedilol, hydralazine, and isosorbide.  Currently receiving these medications.    LOS: 0   2/20/20232:58 PM

## 2021-08-08 NOTE — Assessment & Plan Note (Addendum)
As above.  Eliquis resumed.

## 2021-08-08 NOTE — Assessment & Plan Note (Addendum)
Axillary DVT in December 2022 Continue Eliquis

## 2021-08-08 NOTE — Consult Note (Signed)
Cardiology Consultation:   Patient ID: Jimmy Olson MRN: [id number]; DOB: 07/25/1960  Admit date: 08/08/2021 Date of Consult: 08/08/2021  PCP:  [facility] [place] [facility] Cardiologist:  Ida Rogue, MD        Patient Profile:   Jimmy Olson is a 61 y.o. male with a hx of moderate two-vessel coronary artery disease who is being seen 08/08/2021 for the evaluation of elevated troponin at the request of Dr. Damita Dunnings.  History of Present Illness:   Jimmy Olson is a 61 year old male with past medical history of insulin-dependent type 2 diabetes, chronic systolic heart failure with an EF of 45%, end-stage renal disease initiated dialysis in December 2022, anemia of chronic kidney disease, borderline two-vessel coronary artery disease on left heart catheterization in December 2022, DVT of the left axillary vein in December 2022 on Eliquis and recent admission for TIA with MRI showing ulcerated plaque of the proximal left ICA. The patient had previous COVID-19 infection and currently is not vaccinated.  He presented with fatigue, nonproductive cough, nasal congestion and shortness of breath.  He had low-grade fever on presentation with significant tachycardia with a heart rate of 122 bpm, he was tachypneic at 26 and severely hypertensive at 208/106.  He was noted to have elevated troponin which gradually increased to slightly above 3000.  He had a similar presentation in December 2022.  He denies chest pain.  EKG showed no ischemic changes.   Past Medical History:  Diagnosis Date   Acute on chronic HFrEF (heart failure with reduced ejection fraction) ([place]) 07/15/2020   ATN (acute tubular necrosis) (HCC)    Chronic HFrEF (heart failure with reduced ejection fraction) ([place])    a. 06/2020 Echo: EF 30-35%, glob HK, GrII DD, nl RV fxn. Mild LAE.   Chronic kidney disease (CKD), stage IV (severe) ([place])    COVID-19 virus infection 02/2020   Demand ischemia (HCC)     Diabetes mellitus without complication ([place])    Elevated troponin 07/15/2020   Hyperkalemia 12/23/2020   Hypertension    NICM (nonischemic cardiomyopathy) ([place])    a. 07/2009 MV: EF 48%; b. 08/2019 Echo: EF 45-50%. Global HK. Mod LVH. GrIDD; c. 06/2020 Echo: EF 30-35%, glob HK, mild LVH, GrII DD, nl RV fxn, mild LAE; d. 06/2020 Cath: nonobs dzs.   Nonobstructive CAD (coronary artery disease)    a. 07/2009 MV: EF 48%. No ischemia; b. 06/2020 Cath: LM min irregs, LAD 40p, 33m, D1 70, D2 min irregs, D3 nl, RI min irregs, LCX small, mild diff dzs, RCA 20p, 36m, 50d, RPAV 50-->Med Rx.   NSTEMI (non-ST elevated myocardial infarction) [facility])     Past Surgical History:  Procedure Laterality Date   APPENDECTOMY     CARDIAC CATHETERIZATION     DIALYSIS/PERMA CATHETER INSERTION N/A 06/02/2021   Procedure: DIALYSIS/PERMA CATHETER INSERTION;  Surgeon: Algernon Huxley, MD;  Location: [place] [facility];  Service: Cardiovascular;  Laterality: N/A;   LEFT HEART CATH AND CORONARY ANGIOGRAPHY N/A 06/03/2021   Procedure: LEFT HEART CATH AND CORONARY ANGIOGRAPHY;  Surgeon: Wellington Hampshire, MD;  Location: [place] [facility];  Service: Cardiovascular;  Laterality: N/A;   NASAL SINUS SURGERY     RIGHT/LEFT HEART CATH AND CORONARY ANGIOGRAPHY N/A 07/19/2020   Procedure: RIGHT/LEFT HEART CATH AND CORONARY ANGIOGRAPHY;  Surgeon: Wellington Hampshire, MD;  Location: [place] [facility];  Service: Cardiovascular;  Laterality: N/A;     Home Medications:  Prior to Admission  medications   Medication Sig Start Date End Date Taking? Authorizing Provider  apixaban (ELIQUIS) 5 MG TABS tablet TAKE 1 TABLET BY MOUTH TWICE DAILY. 07/11/21  Yes   carvedilol (COREG) 25 MG tablet Take 1 tablet (25 mg total) by mouth 2 (two) times daily. 07/25/21  Yes Gollan, Kathlene November, MD  HUMALOG KWIKPEN 100 UNIT/ML KwikPen Inject 3 Units into the skin 3 (three) times daily. 07/11/21  Yes [provider]  hydrALAZINE (APRESOLINE) 100 MG tablet  Take 1 tablet (100 mg total) by mouth once every 8 (eight) hours. 07/25/21  Yes Gollan, Kathlene November, MD  insulin detemir (LEVEMIR FLEXTOUCH) 100 UNIT/ML FlexPen Inject 20 Units into the skin once daily at bedtime. 07/12/21 08/11/21 Yes   isosorbide mononitrate (IMDUR) 30 MG 24 hr tablet Take 1 tablet (30 mg total) by mouth in the morning and at bedtime. 07/25/21  Yes Gollan, Kathlene November, MD  rosuvastatin (CRESTOR) 40 MG tablet TAKE 1 TABLET BY MOUTH ONCE DAILY. 07/25/21  Yes Minna Merritts, MD  acetaminophen (TYLENOL) 325 MG tablet Take 325 mg by mouth every 6 (six) hours as needed. Patient not taking: Reported on 08/04/2021 09/20/20   [provider]  albuterol (PROVENTIL HFA) 108 (90 Base) MCG/ACT inhaler INHALE 2 PUFFS INTO THE LUNGS ONCE EVERY 6 HOURS AS NEEDED FOR WHEEZING OR SHORTNESS OF BREATH. Patient not taking: Reported on 08/04/2021 07/13/21     cetirizine (ZYRTEC) 10 MG tablet Take 10 mg by mouth daily as needed for allergies.    [provider]  D3-50 1.25 MG (50000 UT) capsule Take 50,000 Units by mouth once a week. Patient not taking: Reported on 08/04/2021 10/21/20   [provider]  furosemide (LASIX) 80 MG tablet Take 80 mg daily as needed on nondialysis days for swelling or shortness of breath Patient not taking: Reported on 08/04/2021 07/25/21   Minna Merritts, MD  gabapentin (NEURONTIN) 300 MG capsule Take 1 capsule (300 mg total) by mouth at bedtime for 7 days. 06/07/21 07/25/21  Sidney Ace, MD  Insulin Pen Needle 32G X 4 MM MISC USE AS DIRECTED 06/07/21   Ralene Muskrat B, MD  nitroGLYCERIN (NITROSTAT) 0.4 MG SL tablet Place 1 tablet (0.4 mg total) under the tongue every 5 (five) minutes as needed for chest pain. 07/25/20   Jennye Boroughs, MD  atorvastatin (LIPITOR) 80 MG tablet Take 1 tablet (80 mg total) by mouth daily. 06/07/21 07/11/21  Sidney Ace, MD  isosorbide dinitrate (ISORDIL) 30 MG tablet TAKE 1 TABLET BY MOUTH ONCE DAILY. 07/12/21 07/13/21       Inpatient Medications: Scheduled Meds:  albuterol  2 puff Inhalation Q6H   [START ON 08/09/2021] aspirin EC  81 mg Oral Daily   [START ON 08/09/2021] atorvastatin  80 mg Oral Daily   carvedilol  25 mg Oral BID WC   Chlorhexidine Gluconate Cloth  6 each Topical Q0600   furosemide  40 mg Intravenous BID   heparin sodium (porcine)       hydrALAZINE  100 mg Oral Q8H   insulin aspart  0-15 Units Subcutaneous TID WC   insulin aspart  0-5 Units Subcutaneous QHS   insulin detemir  20 Units Subcutaneous QHS   isosorbide mononitrate  30 mg Oral Daily   Continuous Infusions:  sodium chloride     sodium chloride     heparin 750 Units/hr (08/08/21 0246)   [START ON 08/09/2021] remdesivir 100 mg in NS 100 mL     PRN Meds:  sodium chloride, sodium chloride, acetaminophen, alteplase, chlorpheniramine-HYDROcodone, guaiFENesin-dextromethorphan, heparin, lidocaine (PF), lidocaine-prilocaine, nitroGLYCERIN, ondansetron (ZOFRAN) IV, pentafluoroprop-tetrafluoroeth  Allergies:   No Known Allergies  Social History:   Social History   Socioeconomic History   Marital status: Married    Spouse name: Not on file   Number of children: Not on file   Years of education: Not on file   Highest education level: Not on file  Occupational History   Not on file  Tobacco Use   Smoking status: Never   Smokeless tobacco: Never  Vaping Use   Vaping Use: Never used  Substance and Sexual Activity   Alcohol use: Never   Drug use: Never   Sexual activity: Not on file  Other Topics Concern   Not on file  Social History Narrative   Lives locally with wife and son.  He is currently unemployed - has worked in different industries.     Social Determinants of Health   Financial Resource Strain: Not on file  Food Insecurity: Not on file  Transportation Needs: Not on file  Physical Activity: Not on file  Stress: Not on file  Social Connections: Not on file  Intimate Partner Violence: Not on file    Family  History:    Family History  Problem Relation Age of Onset   Kidney failure Mother        died @ 3   Heart failure Mother    Other Father        he never knew his father   Diabetes Brother      ROS:  Please see the history of present illness.   All other ROS reviewed and negative.     Physical Exam/Data:   Vitals:   08/08/21 1719 08/08/21 1730 08/08/21 1731 08/08/21 1733  BP: (!) 156/79  (!) 158/85   Pulse: 83  85   Resp: (!) 21 18 16 15   Temp:      TempSrc:      SpO2: 100%  100%   Weight:      Height:        Intake/Output Summary (Last 24 hours) at 08/08/2021 1743 Last data filed at 08/08/2021 1100 Gross per 24 hour  Intake 552 ml  Output 1425 ml  Net -873 ml   Last 3 Weights 08/08/2021 08/08/2021 08/08/2021  Weight (lbs) 148 lb 9.4 oz 150 lb 5.7 oz 140 lb  Weight (kg) 67.4 kg 68.2 kg 63.504 kg     Body mass index is 23.98 kg/m.  General:  Well nourished, well developed, in no acute distress HEENT: normal Neck: no JVD Vascular: No carotid bruits; Distal pulses 2+ bilaterally Cardiac:  normal S1, S2; RRR; no murmur  Lungs:  clear to auscultation bilaterally, no wheezing, rhonchi or rales  Abd: soft, nontender, no hepatomegaly  Ext: no edema Musculoskeletal:  No deformities, BUE and BLE strength normal and equal Skin: warm and dry  Neuro:  CNs 2-12 intact, no focal abnormalities noted Psych:  Normal affect   EKG:  The EKG was personally reviewed and demonstrates: Sinus rhythm with no significant ST or T wave changes. Telemetry:  Telemetry was personally reviewed and demonstrates: Normal sinus rhythm  Relevant CV Studies: I personally reviewed his echocardiogram which was done today which showed an EF of 40 to 45% with global hypokinesis.  Laboratory Data:  High Sensitivity Troponin:   Recent Labs  Lab 08/08/21 0023 08/08/21 0227  TROPONINIHS 983* 3,577*     Chemistry Recent Labs  Lab  08/08/21 0004  NA 133*  K 4.6  CL 103  CO2 23  GLUCOSE 504*   BUN 35*  CREATININE 3.81*  CALCIUM 8.8*  GFRNONAA 18*  ANIONGAP 7    Recent Labs  Lab 08/08/21 0004  PROT 7.4  ALBUMIN 3.8  AST 27  ALT 25  ALKPHOS 131*  BILITOT 1.0   Lipids  Recent Labs  Lab 08/08/21 0424  CHOL 90  TRIG 47  HDL 35*  LDLCALC 46  CHOLHDL 2.6    Hematology Recent Labs  Lab 08/08/21 0004  WBC 7.3  RBC 3.24*  HGB 9.4*  HCT 28.9*  MCV 89.2  MCH 29.0  MCHC 32.5  RDW 12.5  PLT 223   Thyroid No results for input(s): TSH, FREET4 in the last 168 hours.  BNP Recent Labs  Lab 08/08/21 0023  BNP 2,160.8*    DDimer No results for input(s): DDIMER in the last 168 hours.   Radiology/Studies:  DG Chest 1 View  Result Date: 08/08/2021 CLINICAL DATA:  Cough and fevers EXAM: PORTABLE CHEST 1 VIEW COMPARISON:  05/31/2021 FINDINGS: Cardiac shadow is enlarged but stable. Dialysis catheter is noted in satisfactory position. Mild vascular congestion is again noted the related to fluid overload. Interstitial edema is noted. No bony abnormality is seen. IMPRESSION: CHF with interstitial edema stable from the prior exam Electronically Signed   By: Inez Catalina M.D.   On: 08/08/2021 00:30   ECHOCARDIOGRAM LIMITED  Result Date: 08/08/2021    ECHOCARDIOGRAM LIMITED REPORT   Patient Name:   Jimmy Olson Date of Exam: 08/08/2021 Medical Rec #:  [id number]     Height:       66.0 in Accession #:    [id number]    Weight:       150.4 lb Date of Birth:  07/23/1960     BSA:          1.771 m Patient Age:    63 years      BP:           152/87 mmHg Patient Gender: M             HR:           84 bpm. Exam Location:  [facility] Procedure: Limited Echo, Limited Color Doppler, Cardiac Doppler and Strain            Analysis Indications:     I21.4 NSTEMI  History:         Patient has prior history of Echocardiogram examinations, most                  recent 07/15/2021. HFrEF, CKD, stage IV; Risk                  Factors:Hypertension and Diabetes. Pt tested positive for                  COVID-19  on 08/08/21.  Sonographer:     Charmayne Sheer Referring Phys:  [id number] Athena Masse Diagnosing Phys: [name] MD  Sonographer Comments: Global longitudinal strain was attempted. IMPRESSIONS  1. Left ventricular ejection fraction, by estimation, is 40 to 45%. The left ventricle has mildly decreased function. The left ventricle demonstrates global hypokinesis. The left ventricular internal cavity size was mildly dilated. There is mild left ventricular hypertrophy. Left ventricular diastolic function could not be evaluated. The average left ventricular global longitudinal strain is -8.6 %. The global longitudinal strain is abnormal.  2. Right ventricular systolic  function is normal. The right ventricular size is normal. Tricuspid regurgitation signal is inadequate for assessing PA pressure.  3. Left atrial size was mildly dilated.  4. The mitral valve is normal in structure. No evidence of mitral valve regurgitation. No evidence of mitral stenosis.  5. The aortic valve is normal in structure. Aortic valve regurgitation is not visualized. Aortic valve sclerosis is present, with no evidence of aortic valve stenosis.  6. The inferior vena cava is normal in size with greater than 50% respiratory variability, suggesting right atrial pressure of 3 mmHg. FINDINGS  Left Ventricle: Left ventricular ejection fraction, by estimation, is 40 to 45%. The left ventricle has mildly decreased function. The left ventricle demonstrates global hypokinesis. The average left ventricular global longitudinal strain is -8.6 %. The  global longitudinal strain is abnormal. The left ventricular internal cavity size was mildly dilated. There is mild left ventricular hypertrophy. Left ventricular diastolic function could not be evaluated. Right Ventricle: The right ventricular size is normal. No increase in right ventricular wall thickness. Right ventricular systolic function is normal. Tricuspid regurgitation signal is inadequate for assessing  PA pressure. Left Atrium: Left atrial size was mildly dilated. Right Atrium: Right atrial size was normal in size. Pericardium: There is no evidence of pericardial effusion. Mitral Valve: The mitral valve is normal in structure. Mild mitral annular calcification. No evidence of mitral valve stenosis. Tricuspid Valve: The tricuspid valve is normal in structure. Tricuspid valve regurgitation is not demonstrated. No evidence of tricuspid stenosis. Aortic Valve: The aortic valve is normal in structure. Aortic valve regurgitation is not visualized. Aortic valve sclerosis is present, with no evidence of aortic valve stenosis. Pulmonic Valve: The pulmonic valve was normal in structure. Pulmonic valve regurgitation is not visualized. No evidence of pulmonic stenosis. Aorta: The aortic root is normal in size and structure. Venous: The inferior vena cava is normal in size with greater than 50% respiratory variability, suggesting right atrial pressure of 3 mmHg. IAS/Shunts: No atrial level shunt detected by color flow Doppler. LEFT VENTRICLE PLAX 2D LVIDd:         5.26 cm LVIDs:         4.13 cm 2D Longitudinal Strain LV PW:         1.38 cm 2D Strain GLS Avg:     -8.6 % LV IVS:        1.05 cm  LEFT ATRIUM         Index LA diam:    4.40 cm 2.48 cm/m [name] MD Electronically signed by [name] MD Signature Date/Time: 08/08/2021/10:23:08 AM    Final      Assessment and Plan:   Elevated troponin: I suspect is likely due to supply demand ischemia in the setting of COVID-19 infection with significant tachycardia and hypertension on presentation.  This is in the setting of background coronary artery disease with borderline significant two-vessel coronary artery disease on previous cardiac catheterization in December 2022.  I doubt a primary cardiac event although this cannot be completely excluded.  I agree with unfractionated heparin drip for 48 hours.  Continue aspirin.  I personally reviewed his cardiac  catheterization images done in December.  Given recurrent presentations with significantly elevated troponin in the setting of acute illnesses, it is worth interrogating his LAD stenosis with fractional flow reserve with treatment if needed.  However, I do not recommend doing this at the present time until he recovers from his current illness.  We will plan on scheduling the procedure  at [facility] as an outpatient once he recovers given that if the LAD is significant, it will certainly require atherectomy considering the degree of calcifications.  In addition, the first diagonal stenosis might need to be treated.  Continue aspirin and high-dose atorvastatin. Chronic systolic heart failure likely due to a combination of ischemic and nonischemic cardiomyopathy.  Fluid management per dialysis.  Continue carvedilol 25 mg twice daily. Essential hypertension: Blood pressure is not controlled.  Consider increasing the dose of Imdur. Hyperlipidemia: Continue high-dose atorvastatin. Left axillary vein DVT related to dialysis access: This was diagnosed in December 2022 and he has been on Eliquis since then.    For questions or updates, please contact [place] Please consult [email or web address] for contact info under    Signed, [name], MD  08/08/2021 5:43 PM

## 2021-08-08 NOTE — Assessment & Plan Note (Addendum)
Resolved.  Suspect mostly related to CHF than to COVID.  Presented with shortness of breath O2 sat 87% on room air, tachypnea with increased work of breathing. Supplemental O2 to keep sats over 94% Completed remdesivir for COVID

## 2021-08-08 NOTE — Assessment & Plan Note (Addendum)
Secondary to hypertensive emergency, possible primary ACS.  No missed dialysis sessions Treated with IV Lasix, now on home PO Lasix PRN Continue home carvedilol, hydralazine, Imdur Daily weights with intake and output monitoring EF 45 to 50% with G1 DD on 07/15/2021

## 2021-08-08 NOTE — Progress Notes (Addendum)
Progress Note    Jimmy Olson  JJK:[id number] DOB: 17-Jul-1960  DOA: 08/08/2021 [facility]CP: [facility] [place]      Brief Narrative:    Medical records reviewed and are as summarized below:  Jimmy Olson is a 61 y.o. male with medical history significant for insulin-dependent type 2 diabetes, chronic systolic and diastolic CHF (EF 45 to 37%, G1DD 07/15/21), ESRD initiated on HD 05/2021, anemia of CKD,  two-vessel CAD on LHC 06/2020, depression, DVT of axillary vein 05/2021 on Eliquis, recently hospitalized from 1/26 to 1/27 with a TIA, with MRA neck showing ulcerated plaque proximal left ICA with 50 to 60% stenosis.  He presented to the hospital because of dry cough, nasal congestion and shortness of breath.  He is vaccinated against influenza but not COVID.     Assessment/Plan:   Principal Problem:   Acute on chronic combined systolic and diastolic CHF (congestive heart failure) (HCC) Active Problems:   Hypertensive emergency   Acute respiratory failure with hypoxia ([place])   Uncontrolled type 2 diabetes mellitus with hyperglycemia, with long-term current use of insulin (HCC)   ESRD on hemodialysis ([place])   NSTEMI (non-ST elevated myocardial infarction) ([place])   History of DVT (deep vein thrombosis)   Chronic anticoagulation   COVID-19 virus infection   Anemia of chronic kidney failure, stage 5 (HCC)    Body mass index is 23.98 kg/m.   Acute on chronic combined systolic and diastolic CHF: Continue IV Lasix and antihypertensives.  Monitor BMP, daily weight and urine output.  2D echo in January 2023 showed EF estimated at 45 to 16%, grade 1 diastolic dysfunction.  Probable acute NSTEMI: Troponins 983 to 3,577.  Continue IV heparin infusion.  Consulted cardiologist ([place] [facility]) to assist with management.  Acute hypoxic respiratory failure: Continue 2.5 L oxygen and taper off as able.   Hypertensive emergency: BP has improved.  Continue  antihypertensives  ESRD, anemia of chronic disease, secondary hyperparathyroidism: Follow-up with nephrologist for hemodialysis  Type II DM with severe hyperglycemia: Continue Levemir and use NovoLog as needed for hyperglycemia.  COVID-19 infection: It is not clear if he has pneumonia from COVID-19 infection.  Continue IV remdesivir  Left axillary vein DVT diagnosed on 06/02/2021: Eliquis held because he is on IV heparin   Diet Order             Diet heart healthy/carb modified Room service appropriate? Yes; Fluid consistency: Thin  Diet effective now                      Consultants: Nephrologist Cardiologist  Procedures: None    Medications:    albuterol  2 puff Inhalation Q6H   [START ON 08/09/2021] aspirin EC  81 mg Oral Daily   [START ON 08/09/2021] atorvastatin  80 mg Oral Daily   carvedilol  25 mg Oral BID WC   Chlorhexidine Gluconate Cloth  6 each Topical Q0600   furosemide  40 mg Intravenous BID   heparin sodium (porcine)       hydrALAZINE  100 mg Oral Q8H   insulin aspart  0-15 Units Subcutaneous TID WC   insulin aspart  0-5 Units Subcutaneous QHS   insulin detemir  20 Units Subcutaneous QHS   isosorbide mononitrate  30 mg Oral Daily   Continuous Infusions:  heparin 750 Units/hr (08/08/21 0246)   [START ON 08/09/2021] remdesivir 100 mg in NS 100 mL       Anti-infectives (From admission,  onward)    Start     Dose/Rate Route Frequency Ordered Stop   08/09/21 1000  remdesivir 100 mg in sodium chloride 0.9 % 100 mL IVPB       See Hyperspace for full Linked Orders Report.   100 mg 200 mL/hr over 30 Minutes Intravenous Daily 08/08/21 0226 08/11/21 0959   08/09/21 1000  remdesivir 100 mg in sodium chloride 0.9 % 100 mL IVPB  Status:  Discontinued       See Hyperspace for full Linked Orders Report.   100 mg 200 mL/hr over 30 Minutes Intravenous Daily 08/08/21 0337 08/08/21 0340   08/08/21 0345  remdesivir 200 mg in sodium chloride 0.9% 250 mL IVPB   Status:  Discontinued       See Hyperspace for full Linked Orders Report.   200 mg 580 mL/hr over 30 Minutes Intravenous Once 08/08/21 3662 08/08/21 0340   08/08/21 0230  remdesivir 200 mg in sodium chloride 0.9% 250 mL IVPB       See Hyperspace for full Linked Orders Report.   200 mg 580 mL/hr over 30 Minutes Intravenous Once 08/08/21 0226 08/08/21 0322   08/08/21 0030  ceFEPIme (MAXIPIME) 2 g in sodium chloride 0.9 % 100 mL IVPB        2 g 200 mL/hr over 30 Minutes Intravenous  Once 08/08/21 0023 08/08/21 0244   08/08/21 0030  metroNIDAZOLE (FLAGYL) IVPB 500 mg        500 mg 100 mL/hr over 60 Minutes Intravenous  Once 08/08/21 0023 08/08/21 0147   08/08/21 0030  vancomycin (VANCOCIN) IVPB 1000 mg/200 mL premix  Status:  Discontinued        1,000 mg 200 mL/hr over 60 Minutes Intravenous  Once 08/08/21 0023 08/08/21 0026   08/08/21 0030  vancomycin (VANCOREADY) IVPB 1500 mg/300 mL        1,500 mg 150 mL/hr over 120 Minutes Intravenous  Once 08/08/21 0026 08/08/21 0346              Family Communication/Anticipated D/C date and plan/Code Status   DVT prophylaxis:      Code Status: Full Code  Family Communication: None Disposition Plan: Plan to discharge home in 2 to 3 days   Status is: Inpatient Remains inpatient appropriate because:  ? NSTEMI, CHF exacerbation               Subjective:   Interval events noted.  He complains of cough and shortness of breath.  No chest pain  Objective:    Vitals:   08/08/21 0759 08/08/21 1158 08/08/21 1556 08/08/21 1626  BP: (!) 152/87 (!) 142/81 139/72   Pulse: 88 83 87   Resp: 18 18 18    Temp: 98.5 F (36.9 C) 98.1 F (36.7 C) 99 F (37.2 C)   TempSrc:      SpO2: 100% 98% 100%   Weight:    67.4 kg  Height:       No data found.   Intake/Output Summary (Last 24 hours) at 08/08/2021 1639 Last data filed at 08/08/2021 1100 Gross per 24 hour  Intake 552 ml  Output 1425 ml  Net -873 ml   Filed Weights    08/08/21 0000 08/08/21 0500 08/08/21 1626  Weight: 63.5 kg 68.2 kg 67.4 kg    Exam:  GEN: NAD SKIN: Warm and dry EYES: No pallor or icterus ENT: MMM CV: RRR PULM: CTA B ABD: soft, ND, NT, +BS CNS: AAO x 3, non focal EXT: No  edema or tenderness        Data Reviewed:   I have personally reviewed following labs and imaging studies:  Labs: Labs show the following:   Basic Metabolic Panel: Recent Labs  Lab 08/08/21 0004  NA 133*  K 4.6  CL 103  CO2 23  GLUCOSE 504*  BUN 35*  CREATININE 3.81*  CALCIUM 8.8*   GFR Estimated Creatinine Clearance: 19.1 mL/min (A) (by C-G formula based on SCr of 3.81 mg/dL (H)). Liver Function Tests: Recent Labs  Lab 08/08/21 0004  AST 27  ALT 25  ALKPHOS 131*  BILITOT 1.0  PROT 7.4  ALBUMIN 3.8   No results for input(s): LIPASE, AMYLASE in the last 168 hours. No results for input(s): AMMONIA in the last 168 hours. Coagulation profile Recent Labs  Lab 08/08/21 0004  INR 1.2    CBC: Recent Labs  Lab 08/08/21 0004  WBC 7.3  NEUTROABS 6.0  HGB 9.4*  HCT 28.9*  MCV 89.2  PLT 223   Cardiac Enzymes: No results for input(s): CKTOTAL, CKMB, CKMBINDEX, TROPONINI in the last 168 hours. BNP (last 3 results) No results for input(s): PROBNP in the last 8760 hours. CBG: Recent Labs  Lab 08/08/21 0250 08/08/21 0802 08/08/21 1201 08/08/21 1601  GLUCAP 317* 132* 91 153*   D-Dimer: No results for input(s): DDIMER in the last 72 hours. Hgb A1c: No results for input(s): HGBA1C in the last 72 hours. Lipid Profile: Recent Labs    08/08/21 0424  CHOL 90  HDL 35*  LDLCALC 46  TRIG 47  CHOLHDL 2.6   Thyroid function studies: No results for input(s): TSH, T4TOTAL, T3FREE, THYROIDAB in the last 72 hours.  Invalid input(s): FREET3 Anemia work up: No results for input(s): VITAMINB12, FOLATE, FERRITIN, TIBC, IRON, RETICCTPCT in the last 72 hours. Sepsis Labs: Recent Labs  Lab 08/08/21 0004 08/08/21 0013  08/08/21 0115  PROCALCITON  --  0.15  --   WBC 7.3  --   --   LATICACIDVEN  --   --  0.7    Microbiology Recent Results (from the past 240 hour(s))  Culture, blood (Routine x 2)     Status: None (Preliminary result)   Collection Time: 08/08/21 12:04 AM   Specimen: Left Antecubital; Blood  Result Value Ref Range Status   Specimen Description LEFT ANTECUBITAL  Final   Special Requests   Final    BOTTLES DRAWN AEROBIC AND ANAEROBIC Blood Culture results may not be optimal due to an excessive volume of blood received in culture bottles   Culture   Final    NO GROWTH < 12 HOURS Performed at [facility], [address] [place], [place] [address]    Report Status PENDING  Incomplete  Resp Panel by RT-PCR (Flu A&B, Covid) Nasopharyngeal Swab     Status: Abnormal   Collection Time: 08/08/21 12:13 AM   Specimen: Nasopharyngeal Swab; Nasopharyngeal(NP) swabs in vial transport medium  Result Value Ref Range Status   SARS Coronavirus 2 by RT PCR POSITIVE (A) NEGATIVE Final    Comment: (NOTE) SARS-CoV-2 target nucleic acids are DETECTED.  The SARS-CoV-2 RNA is generally detectable in upper respiratory specimens during the acute phase of infection. Positive results are indicative of the presence of the identified virus, but do not rule out bacterial infection or co-infection with other pathogens not detected by the test. Clinical correlation with patient history and other diagnostic information is necessary to determine patient infection status. The expected result is Negative.  Fact Sheet for Patients: EntrepreneurPulse.com.au  Fact Sheet for Healthcare Providers: IncredibleEmployment.be  This test is not yet approved or cleared by the [place] FDA and  has been authorized for detection and/or diagnosis of SARS-CoV-2 by FDA under an Emergency Use Authorization (EUA).  This EUA will remain in effect (meaning this test can be used) for  the duration of  the COVID-19 declaration under Section 564(b)(1) of the A ct, 21 U.S.C. section 360bbb-3(b)(1), unless the authorization is terminated or revoked sooner.     Influenza A by PCR NEGATIVE NEGATIVE Final   Influenza B by PCR NEGATIVE NEGATIVE Final    Comment: (NOTE) The Xpert Xpress SARS-CoV-2/FLU/RSV plus assay is intended as an aid in the diagnosis of influenza from Nasopharyngeal swab specimens and should not be used as a sole basis for treatment. Nasal washings and aspirates are unacceptable for Xpert Xpress SARS-CoV-2/FLU/RSV testing.  Fact Sheet for Patients: EntrepreneurPulse.com.au  Fact Sheet for Healthcare Providers: IncredibleEmployment.be  This test is not yet approved or cleared by the [place] FDA and has been authorized for detection and/or diagnosis of SARS-CoV-2 by FDA under an Emergency Use Authorization (EUA). This EUA will remain in effect (meaning this test can be used) for the duration of the COVID-19 declaration under Section 564(b)(1) of the Act, 21 U.S.C. section 360bbb-3(b)(1), unless the authorization is terminated or revoked.  Performed at [facility], [address] [place], [place] [address]   Culture, blood (Routine x 2)     Status: None (Preliminary result)   Collection Time: 08/08/21  1:50 AM   Specimen: BLOOD  Result Value Ref Range Status   Specimen Description BLOOD LEFT  Final   Special Requests   Final    BOTTLES DRAWN AEROBIC AND ANAEROBIC Blood Culture adequate volume   Culture   Final    NO GROWTH < 12 HOURS Performed at [facility], [address] [place], [place] [address]    Report Status PENDING  Incomplete    Procedures and diagnostic studies:  DG Chest 1 View  Result Date: 08/08/2021 CLINICAL DATA:  Cough and fevers EXAM: PORTABLE CHEST 1 VIEW COMPARISON:  05/31/2021 FINDINGS: Cardiac shadow is enlarged but stable. Dialysis catheter is noted  in satisfactory position. Mild vascular congestion is again noted the related to fluid overload. Interstitial edema is noted. No bony abnormality is seen. IMPRESSION: CHF with interstitial edema stable from the prior exam Electronically Signed   By: Inez Catalina M.D.   On: 08/08/2021 00:30   ECHOCARDIOGRAM LIMITED  Result Date: 08/08/2021    ECHOCARDIOGRAM LIMITED REPORT   Patient Name:   Jimmy Olson Date of Exam: 08/08/2021 Medical Rec #:  [id number]     Height:       66.0 in Accession #:    [id number]    Weight:       150.4 lb Date of Birth:  Jan 05, 1961     BSA:          1.771 m Patient Age:    63 years      BP:           152/87 mmHg Patient Gender: M             HR:           84 bpm. Exam Location:  [facility] Procedure: Limited Echo, Limited Color Doppler, Cardiac Doppler and Strain            Analysis Indications:     I21.4 NSTEMI  History:         Patient has prior history of Echocardiogram examinations, most                  recent 07/15/2021. HFrEF, CKD, stage IV; Risk                  Factors:Hypertension and Diabetes. Pt tested positive for                  COVID-19 on 08/08/21.  Sonographer:     Charmayne Sheer Referring Phys:  [id number] Athena Masse Diagnosing Phys: [name] MD  Sonographer Comments: Global longitudinal strain was attempted. IMPRESSIONS  1. Left ventricular ejection fraction, by estimation, is 40 to 45%. The left ventricle has mildly decreased function. The left ventricle demonstrates global hypokinesis. The left ventricular internal cavity size was mildly dilated. There is mild left ventricular hypertrophy. Left ventricular diastolic function could not be evaluated. The average left ventricular global longitudinal strain is -8.6 %. The global longitudinal strain is abnormal.  2. Right ventricular systolic function is normal. The right ventricular size is normal. Tricuspid regurgitation signal is inadequate for assessing PA pressure.  3. Left atrial size was mildly dilated.  4. The mitral  valve is normal in structure. No evidence of mitral valve regurgitation. No evidence of mitral stenosis.  5. The aortic valve is normal in structure. Aortic valve regurgitation is not visualized. Aortic valve sclerosis is present, with no evidence of aortic valve stenosis.  6. The inferior vena cava is normal in size with greater than 50% respiratory variability, suggesting right atrial pressure of 3 mmHg. FINDINGS  Left Ventricle: Left ventricular ejection fraction, by estimation, is 40 to 45%. The left ventricle has mildly decreased function. The left ventricle demonstrates global hypokinesis. The average left ventricular global longitudinal strain is -8.6 %. The  global longitudinal strain is abnormal. The left ventricular internal cavity size was mildly dilated. There is mild left ventricular hypertrophy. Left ventricular diastolic function could not be evaluated. Right Ventricle: The right ventricular size is normal. No increase in right ventricular wall thickness. Right ventricular systolic function is normal. Tricuspid regurgitation signal is inadequate for assessing PA pressure. Left Atrium: Left atrial size was mildly dilated. Right Atrium: Right atrial size was normal in size. Pericardium: There is no evidence of pericardial effusion. Mitral Valve: The mitral valve is normal in structure. Mild mitral annular calcification. No evidence of mitral valve stenosis. Tricuspid Valve: The tricuspid valve is normal in structure. Tricuspid valve regurgitation is not demonstrated. No evidence of tricuspid stenosis. Aortic Valve: The aortic valve is normal in structure. Aortic valve regurgitation is not visualized. Aortic valve sclerosis is present, with no evidence of aortic valve stenosis. Pulmonic Valve: The pulmonic valve was normal in structure. Pulmonic valve regurgitation is not visualized. No evidence of pulmonic stenosis. Aorta: The aortic root is normal in size and structure. Venous: The inferior vena cava  is normal in size with greater than 50% respiratory variability, suggesting right atrial pressure of 3 mmHg. IAS/Shunts: No atrial level shunt detected by color flow Doppler. LEFT VENTRICLE PLAX 2D LVIDd:         5.26 cm LVIDs:         4.13 cm 2D Longitudinal Strain LV PW:         1.38 cm 2D Strain GLS Avg:     -8.6 % LV IVS:        1.05 cm  LEFT ATRIUM  Index LA diam:    4.40 cm 2.48 cm/m [name] MD Electronically signed by [name] MD Signature Date/Time: 08/08/2021/10:23:08 AM    Final                LOS: 0 days   [place]  [facility]   Pager on [email or web address]. If 7PM-7AM, please contact night-coverage at [email or web address]     08/08/2021, 4:39 PM

## 2021-08-08 NOTE — ED Notes (Signed)
Pt has urinal and knows need of ua

## 2021-08-08 NOTE — TOC Initial Note (Signed)
Transition of Care [facility]) - Initial/Assessment Note    Patient Details  Name: Jimmy Olson MRN: [id number] Date of Birth: 09-Mar-1961  [facility] CM/SW Contact:    Alberteen Sam, LCSW Phone Number: 08/08/2021, 8:47 AM  Clinical Narrative:                  Patient known to unit from recent admission.   Hx of charity HH PT and OT through [place].   Patient has walker at home and no DME needs.    Patient attends outpatient HD.   Patient is current with [facility] for PCP services and gets his prescriptions from there.     TOC will follow for needs.    Expected Discharge Plan: [place] Barriers to Discharge: Continued Medical Work up   Patient Goals and CMS Choice Patient states their goals for this hospitalization and ongoing recovery are:: to go home CMS Medicare.gov Compare Post Acute Care list provided to:: Patient Choice offered to / list presented to : Patient  Expected Discharge Plan and Services Expected Discharge Plan: [place]                                              Prior Living Arrangements/Services   Lives with:: Self                   Activities of Daily Living Home Assistive Devices/Equipment: None ADL Screening (condition at time of admission) Patient's cognitive ability adequate to safely complete daily activities?: Yes Is the patient deaf or have difficulty hearing?: No Does the patient have difficulty seeing, even when wearing glasses/contacts?: No Does the patient have difficulty concentrating, remembering, or making decisions?: No Patient able to express need for assistance with ADLs?: No Does the patient have difficulty dressing or bathing?: No Independently performs ADLs?: Yes (appropriate for developmental age) Does the patient have difficulty walking or climbing stairs?: No Weakness of Legs: None Weakness of Arms/Hands: None  Permission Sought/Granted                   Emotional Assessment       Orientation: : Oriented to Place, Oriented to Self, Oriented to  Time, Oriented to Situation Alcohol / Substance Use: Not Applicable Psych Involvement: No (comment)  Admission diagnosis:  Hyperglycemia [R73.9] NSTEMI (non-ST elevated myocardial infarction) ([place]) [I21.4] Acute respiratory failure with hypoxia ([place]) [J96.01] Acute on chronic congestive heart failure, unspecified heart failure type ([place]) [I50.9] Acute sepsis ([place]) [A41.9] COVID-19 [U07.1] Patient Active Problem List   Diagnosis Date Noted   NSTEMI (non-ST elevated myocardial infarction) ([place]) 08/08/2021   History of DVT (deep vein thrombosis) 08/08/2021   Chronic anticoagulation 08/08/2021   Carotid stenosis 08/03/2021   ESRD on hemodialysis ([place]) 07/14/2021   TIA (transient ischemic attack) 07/14/2021   Acute deep vein thrombosis (DVT) of axillary vein ([place]) - left. diagnosed 05-2021 07/14/2021   History of anemia due to CKD 06/01/2021   Acute on chronic combined systolic and diastolic CHF (congestive heart failure) ([place]) 12/23/2020   Hyperlipidemia associated with type 2 diabetes mellitus ([place]) 12/23/2020   Coronary artery disease involving native coronary artery of native heart without angina pectoris    Severe recurrent major depression without psychotic features ([place]) 07/16/2020   Uncontrolled type 2 diabetes mellitus with hyperglycemia, with  long-term current use of insulin ([place]) 07/15/2020   Acute on chronic HFrEF (heart failure with reduced ejection fraction) ([place]) 07/15/2020   HLD (hyperlipidemia) 07/15/2020   COVID-19 virus infection 02/2020   Anemia of chronic kidney failure, stage 5 ([place]) 02/2020   HFrEF (heart failure with reduced ejection fraction) ([place])    Hypertension associated with diabetes ([place])    Acute respiratory failure with hypoxia ([place]) 09/13/2019   Hypertensive emergency 09/11/2019   Type 2 diabetes mellitus with hyperlipidemia ([place])  09/11/2019   Type 2 diabetes mellitus with other specified complication ([place]) 46/28/6381   PCP:  [facility] [place]:   [place], [name] [address] Phone: [phone] Fax: [phone]  [facility] [address], [place] [address] Phone: [phone] Fax: [phone]  [place], [name] - [place] [address] Phone: [phone] Fax: [phone]     Social Determinants of Health (SDOH) Interventions    Readmission Risk Interventions Readmission Risk Prevention Plan 06/01/2021  Transportation Screening Complete  PCP or Specialist Appt within 3-5 Days Complete  HRI or [place] Complete  Social Work Consult for Industry Planning/Counseling Complete  Palliative Care Screening Not Applicable  Medication Review (RN Care Manager) Referral to Pharmacy  Some recent data might be hidden

## 2021-08-08 NOTE — Progress Notes (Signed)
PHARMACY -  BRIEF ANTIBIOTIC NOTE   Pharmacy has received consult(s) for Vancomycin, Cefepime from an ED provider.  The patient's profile has been reviewed for ht/wt/allergies/indication/available labs.    One time order(s) placed for  Vancomycin 1500 mg IV X 1 and Cefepime 2 gm IV X 1  Further antibiotics/pharmacy consults should be ordered by admitting physician if indicated.                       Thank you, Ciclaly Mulcahey D 08/08/2021  12:27 AM

## 2021-08-08 NOTE — Progress Notes (Signed)
ANTICOAGULATION CONSULT NOTE - Initial Consult  Pharmacy Consult for Heparin  Indication: chest pain/ACS  No Known Allergies  Patient Measurements: Height: 5\' 6"  (167.6 cm) Weight: 66.6 kg (146 lb 13.2 oz) IBW/kg (Calculated) : 63.8 Heparin Dosing Weight: 63.5 kg   Labs: Recent Labs    08/08/21 0004 08/08/21 0023 08/08/21 0227 08/08/21 1107 08/08/21 1854  HGB 9.4*  --   --   --   --   HCT 28.9*  --   --   --   --   PLT 223  --   --   --   --   APTT 33  --   --  77* 57*  LABPROT 15.4*  --   --   --   --   INR 1.2  --   --   --   --   HEPARINUNFRC >1.10*  --   --   --   --   CREATININE 3.81*  --   --   --   --   TROPONINIHS  --  983* 3,577*  --   --      Estimated Creatinine Clearance: 19.1 mL/min (A) (by C-G formula based on SCr of 3.81 mg/dL (H)).  Medications:  Medications Prior to Admission  Medication Sig Dispense Refill Last Dose   apixaban (ELIQUIS) 5 MG TABS tablet TAKE 1 TABLET BY MOUTH TWICE DAILY. 180 tablet 1 08/07/2021 at 0900   carvedilol (COREG) 25 MG tablet Take 1 tablet (25 mg total) by mouth 2 (two) times daily. 60 tablet 6 08/07/2021 at 0900   HUMALOG KWIKPEN 100 UNIT/ML KwikPen Inject 3 Units into the skin 3 (three) times daily.   08/07/2021   hydrALAZINE (APRESOLINE) 100 MG tablet Take 1 tablet (100 mg total) by mouth once every 8 (eight) hours. 90 tablet 6 08/07/2021 at 0900   insulin detemir (LEVEMIR FLEXTOUCH) 100 UNIT/ML FlexPen Inject 20 Units into the skin once daily at bedtime. 15 mL 5 08/07/2021 at 2100   isosorbide mononitrate (IMDUR) 30 MG 24 hr tablet Take 1 tablet (30 mg total) by mouth in the morning and at bedtime. 60 tablet 6 08/07/2021 at 2100   rosuvastatin (CRESTOR) 40 MG tablet TAKE 1 TABLET BY MOUTH ONCE DAILY. 90 tablet 3 08/07/2021 at 2100   acetaminophen (TYLENOL) 325 MG tablet Take 325 mg by mouth every 6 (six) hours as needed. (Patient not taking: Reported on 08/04/2021)      albuterol (PROVENTIL HFA) 108 (90 Base) MCG/ACT inhaler  INHALE 2 PUFFS INTO THE LUNGS ONCE EVERY 6 HOURS AS NEEDED FOR WHEEZING OR SHORTNESS OF BREATH. (Patient not taking: Reported on 08/04/2021) 6.7 g 10    cetirizine (ZYRTEC) 10 MG tablet Take 10 mg by mouth daily as needed for allergies.   prn at unknown   D3-50 1.25 MG (50000 UT) capsule Take 50,000 Units by mouth once a week. (Patient not taking: Reported on 08/04/2021)      furosemide (LASIX) 80 MG tablet Take 80 mg daily as needed on nondialysis days for swelling or shortness of breath (Patient not taking: Reported on 08/04/2021) 90 tablet 3    gabapentin (NEURONTIN) 300 MG capsule Take 1 capsule (300 mg total) by mouth at bedtime for 7 days. 7 capsule 0    Insulin Pen Needle 32G X 4 MM MISC USE AS DIRECTED 100 each 0    nitroGLYCERIN (NITROSTAT) 0.4 MG SL tablet Place 1 tablet (0.4 mg total) under the tongue every 5 (five) minutes as needed  for chest pain. 30 tablet 0 prn at unknown    Assessment: 61 year old male with a past medical history of diabetes, ESRD on HD, anemia of CKD, two-vessel CAD, depression, DVT 12/22 on eliquis, TIA 1/26 with MRA neck showing ulcerated plaque proximal left ICA with 50-60% stenosis who presented with nonproductive cough, nasal congestion, and shortness of breath. Patient was found to be COVID (+) with elevated troponin (3577 max), BNP (2160), and concern for NSTEMI. Pharmacy consulted to dose heparin for ACS/NSTEMI.    Pt was on Eliquis 5 mg PO BID PTA for DVT, last dose on 2/19 AM.  CrCl = 19 ml/min   Date     Time     aPTT/HL     Rate/Comment 0220  1107   77                750 u/hr; therapeutic 0220  1854    57           750 > 900 units/hr  Goal of Therapy:  Heparin level 0.3-0.7 units/ml aPTT 66 - 102 seconds Monitor platelets by anticoagulation protocol: Yes   Plan:  0220 1854  aPTT= 57, subtherapeutic       --Heparin bolus of 950 units x 1  --Increase heparin infusion to 900 u/hr  --Check aPTT/HL in 8 hours following rate change --Will use aPTT to  dose heparin until HL and aPTT correlate --Will check HL and CBC daily per protocol   Dorothe Pea, PharmD, BCPS Clinical Pharmacist   08/08/2021,8:45 PM

## 2021-08-08 NOTE — Assessment & Plan Note (Signed)
Initiated on hemodialysis in December 2022 Nephrology consult for continuation of dialysis

## 2021-08-08 NOTE — Progress Notes (Addendum)
ANTICOAGULATION CONSULT NOTE - Initial Consult  Pharmacy Consult for Heparin  Indication: chest pain/ACS  No Known Allergies  Patient Measurements: Height: 5\' 6"  (167.6 cm) Weight: 68.2 kg (150 lb 5.7 oz) IBW/kg (Calculated) : 63.8 Heparin Dosing Weight: 63.5 kg   Labs: Recent Labs    08/08/21 0004 08/08/21 0023 08/08/21 0227 08/08/21 1107  HGB 9.4*  --   --   --   HCT 28.9*  --   --   --   PLT 223  --   --   --   APTT 33  --   --  77*  LABPROT 15.4*  --   --   --   INR 1.2  --   --   --   HEPARINUNFRC >1.10*  --   --   --   CREATININE 3.81*  --   --   --   TROPONINIHS  --  983* 3,577*  --      Estimated Creatinine Clearance: 19.1 mL/min (A) (by C-G formula based on SCr of 3.81 mg/dL (H)).  Medications:  Medications Prior to Admission  Medication Sig Dispense Refill Last Dose   apixaban (ELIQUIS) 5 MG TABS tablet TAKE 1 TABLET BY MOUTH TWICE DAILY. 180 tablet 1 08/07/2021 at 0900   carvedilol (COREG) 25 MG tablet Take 1 tablet (25 mg total) by mouth 2 (two) times daily. 60 tablet 6 08/07/2021 at 0900   HUMALOG KWIKPEN 100 UNIT/ML KwikPen Inject 3 Units into the skin 3 (three) times daily.   08/07/2021   hydrALAZINE (APRESOLINE) 100 MG tablet Take 1 tablet (100 mg total) by mouth once every 8 (eight) hours. 90 tablet 6 08/07/2021 at 0900   insulin detemir (LEVEMIR FLEXTOUCH) 100 UNIT/ML FlexPen Inject 20 Units into the skin once daily at bedtime. 15 mL 5 08/07/2021 at 2100   isosorbide mononitrate (IMDUR) 30 MG 24 hr tablet Take 1 tablet (30 mg total) by mouth in the morning and at bedtime. 60 tablet 6 08/07/2021 at 2100   rosuvastatin (CRESTOR) 40 MG tablet TAKE 1 TABLET BY MOUTH ONCE DAILY. 90 tablet 3 08/07/2021 at 2100   acetaminophen (TYLENOL) 325 MG tablet Take 325 mg by mouth every 6 (six) hours as needed. (Patient not taking: Reported on 08/04/2021)      albuterol (PROVENTIL HFA) 108 (90 Base) MCG/ACT inhaler INHALE 2 PUFFS INTO THE LUNGS ONCE EVERY 6 HOURS AS NEEDED FOR  WHEEZING OR SHORTNESS OF BREATH. (Patient not taking: Reported on 08/04/2021) 6.7 g 10    cetirizine (ZYRTEC) 10 MG tablet Take 10 mg by mouth daily as needed for allergies.   prn at unknown   D3-50 1.25 MG (50000 UT) capsule Take 50,000 Units by mouth once a week. (Patient not taking: Reported on 08/04/2021)      furosemide (LASIX) 80 MG tablet Take 80 mg daily as needed on nondialysis days for swelling or shortness of breath (Patient not taking: Reported on 08/04/2021) 90 tablet 3    gabapentin (NEURONTIN) 300 MG capsule Take 1 capsule (300 mg total) by mouth at bedtime for 7 days. 7 capsule 0    Insulin Pen Needle 32G X 4 MM MISC USE AS DIRECTED 100 each 0    nitroGLYCERIN (NITROSTAT) 0.4 MG SL tablet Place 1 tablet (0.4 mg total) under the tongue every 5 (five) minutes as needed for chest pain. 30 tablet 0 prn at unknown    Assessment: 61 year old male with a past medical history of diabetes, ESRD on HD,  anemia of CKD, two-vessel CAD, depression, DVT 12/22 on eliquis, TIA 1/26 with MRA neck showing ulcerated plaque proximal left ICA with 50-60% stenosis who presented with nonproductive cough, nasal congestion, and shortness of breath. Patient was found to be COVID (+) with elevated troponin (3577 max), BNP (2160), and concern for NSTEMI. Pharmacy consulted to dose heparin for ACS/NSTEMI.    Pt was on Eliquis 5 mg PO BID PTA for DVT, last dose on 2/19 AM.  CrCl = 19 ml/min   Date     Time     aPTT/HL     Rate/Comment 0220  1107   77                750 u/hr; therapeutic  Goal of Therapy:  Heparin level 0.3-0.7 units/ml aPTT 66 - 102 seconds Monitor platelets by anticoagulation protocol: Yes   Plan:  --Heparin level therapeutic x 1 --Continue heparin infusion 750 u/hr  --Check aPTT in 8 hours at 1900 --Will use aPTT to dose heparin until HL and aPTT correlate --Will check HL and CBC daily per protocol   Jimmy Olson 08/08/2021,12:09 PM

## 2021-08-08 NOTE — Progress Notes (Signed)
*  PRELIMINARY RESULTS* Echocardiogram 2D Echocardiogram has been performed.  Jimmy Olson Jimmy Olson 08/08/2021, 9:30 AM

## 2021-08-08 NOTE — Sepsis Progress Note (Signed)
[facility]link following for Sepsis Protocol 

## 2021-08-09 DIAGNOSIS — I248 Other forms of acute ischemic heart disease: Secondary | ICD-10-CM

## 2021-08-09 DIAGNOSIS — I5043 Acute on chronic combined systolic (congestive) and diastolic (congestive) heart failure: Secondary | ICD-10-CM

## 2021-08-09 DIAGNOSIS — U071 COVID-19: Secondary | ICD-10-CM

## 2021-08-09 DIAGNOSIS — Z992 Dependence on renal dialysis: Secondary | ICD-10-CM

## 2021-08-09 DIAGNOSIS — N186 End stage renal disease: Secondary | ICD-10-CM

## 2021-08-09 DIAGNOSIS — J9601 Acute respiratory failure with hypoxia: Secondary | ICD-10-CM

## 2021-08-09 LAB — BASIC METABOLIC PANEL
Anion gap: 11 (ref 5–15)
BUN: 25 mg/dL — ABNORMAL HIGH (ref 6–20)
CO2: 26 mmol/L (ref 22–32)
Calcium: 8.6 mg/dL — ABNORMAL LOW (ref 8.9–10.3)
Chloride: 97 mmol/L — ABNORMAL LOW (ref 98–111)
Creatinine, Ser: 3.33 mg/dL — ABNORMAL HIGH (ref 0.61–1.24)
GFR, Estimated: 21 mL/min — ABNORMAL LOW (ref >60)
Glucose, Bld: 136 mg/dL — ABNORMAL HIGH (ref 70–99)
Potassium: 3.6 mmol/L (ref 3.5–5.1)
Sodium: 134 mmol/L — ABNORMAL LOW (ref 135–145)

## 2021-08-09 LAB — CBC
HCT: 29.2 % — ABNORMAL LOW (ref 39.0–52.0)
Hemoglobin: 10 g/dL — ABNORMAL LOW (ref 13.0–17.0)
MCH: 29.7 pg (ref 26.0–34.0)
MCHC: 34.2 g/dL (ref 30.0–36.0)
MCV: 86.6 fL (ref 80.0–100.0)
Platelets: 190 10*3/uL (ref 150–400)
RBC: 3.37 MIL/uL — ABNORMAL LOW (ref 4.22–5.81)
RDW: 12.7 % (ref 11.5–15.5)
WBC: 4.6 10*3/uL (ref 4.0–10.5)
nRBC: 0 % (ref 0.0–0.2)

## 2021-08-09 LAB — APTT
aPTT: 90 seconds — ABNORMAL HIGH (ref 24–36)
aPTT: 88 seconds — ABNORMAL HIGH (ref 24–36)

## 2021-08-09 LAB — GLUCOSE, CAPILLARY
Glucose-Capillary: 149 mg/dL — ABNORMAL HIGH (ref 70–99)
Glucose-Capillary: 216 mg/dL — ABNORMAL HIGH (ref 70–99)
Glucose-Capillary: 281 mg/dL — ABNORMAL HIGH (ref 70–99)
Glucose-Capillary: 165 mg/dL — ABNORMAL HIGH (ref 70–99)

## 2021-08-09 LAB — HEPATITIS B SURFACE ANTIGEN: Hepatitis B Surface Ag: NONREACTIVE

## 2021-08-09 LAB — HEPATITIS B SURFACE ANTIBODY,QUALITATIVE: Hep B S Ab: NONREACTIVE

## 2021-08-09 LAB — HEPARIN LEVEL (UNFRACTIONATED): Heparin Unfractionated: >1.1 IU/mL — ABNORMAL HIGH (ref 0.30–0.70)

## 2021-08-09 MED ORDER — INSULIN ASPART 100 UNIT/ML IJ SOLN
0.0000 [IU] | Freq: Three times a day (TID) | INTRAMUSCULAR | Status: DC
  Administered 2021-08-09: 5 [IU] via SUBCUTANEOUS
  Administered 2021-08-10: 3 [IU] via SUBCUTANEOUS
  Administered 2021-08-11: 5 [IU] via SUBCUTANEOUS
  Administered 2021-08-12: 3 [IU] via SUBCUTANEOUS
  Administered 2021-08-12: 5 [IU] via SUBCUTANEOUS
  Administered 2021-08-13: 3 [IU] via SUBCUTANEOUS
  Administered 2021-08-13: 2 [IU] via SUBCUTANEOUS
  Administered 2021-08-13: 3 [IU] via SUBCUTANEOUS
  Administered 2021-08-14: 1 [IU] via SUBCUTANEOUS
  Administered 2021-08-14: 5 [IU] via SUBCUTANEOUS
  Filled 2021-08-09 (×10): qty 1

## 2021-08-09 MED ORDER — OXYCODONE HCL 5 MG PO TABS
5.0000 mg | ORAL_TABLET | Freq: Three times a day (TID) | ORAL | Status: AC | PRN
  Administered 2021-08-09: 5 mg via ORAL
  Filled 2021-08-09: qty 1

## 2021-08-09 MED ORDER — APIXABAN 5 MG PO TABS
5.0000 mg | ORAL_TABLET | Freq: Two times a day (BID) | ORAL | Status: DC
  Administered 2021-08-09 – 2021-08-14 (×10): 5 mg via ORAL
  Filled 2021-08-09 (×10): qty 1

## 2021-08-09 MED ORDER — INSULIN DETEMIR 100 UNIT/ML ~~LOC~~ SOLN
8.0000 [IU] | Freq: Every day | SUBCUTANEOUS | Status: DC
  Administered 2021-08-09 – 2021-08-13 (×5): 8 [IU] via SUBCUTANEOUS
  Filled 2021-08-09 (×6): qty 0.08

## 2021-08-09 NOTE — Progress Notes (Signed)
Inpatient Diabetes Program Recommendations  AACE/ADA: New Consensus Statement on Inpatient Glycemic Control (2015)  Target Ranges:  Prepandial:   less than 140 mg/dL      Peak postprandial:   less than 180 mg/dL (1-2 hours)      Critically ill patients:  140 - 180 mg/dL   Lab Results  Component Value Date   GLUCAP 149 (H) 08/09/2021   HGBA1C 10.1 (H) 07/14/2021    Review of Glycemic Control  Latest Reference Range & Units 07/15/21 16:03 08/08/21 02:50 08/08/21 08:02 08/08/21 12:01 08/08/21 16:01 08/08/21 20:39 08/08/21 20:57 08/09/21 08:15  Glucose-Capillary 70 - 99 mg/dL 160 (H) 317 (H) 132 (H) 91 153 (H) 60 (L) 85 149 (H)  (H): Data is abnormally high (L): Data is abnormally low  Diabetes history: DM2 Outpatient Diabetes medications: Levemir 20 units, Nov 3 tid [facility].  Current orders for Inpatient glycemic control:Levemir 20 units qd, 0-15 units tid, 0-5 hs  Inpatient Diabetes Program Recommendations:   -Decrease Levemir to 8 units qd -Decrease Novolog correction to 0-9 units tid, 0-5 units hs  Thank you, Bethena Roys E. Summer Mccolgan, RN, MSN, CDE  Diabetes Coordinator Inpatient Glycemic Control Team Team Pager [phone] (8am-5pm) 08/09/2021 11:08 AM

## 2021-08-09 NOTE — Progress Notes (Signed)
Tolerating and saturating well on room air.

## 2021-08-09 NOTE — Progress Notes (Signed)
[facility]  ROUNDING NOTE   Subjective:   Jimmy Olson is a 61 year old Hispanic male with past medical conditions including anemia, two-vessel CAD, diabetes, depression, DVT with Eliquis, and end-stage renal disease on hemodialysis.  Patient presents to the emergency department with complaints of shortness of breath, cough and nasal congestion.  Patient was admitted for Hyperglycemia [R73.9] NSTEMI (non-ST elevated myocardial infarction) ([place]) [I21.4] Acute respiratory failure with hypoxia (H[facility]) [J96.01] Acute on chronic congestive heart failure, unspecified heart failure type ([place]) [I50.9] Acute sepsis ([place]) [A41.9] COVID-19 [U07.1]  Patient is known to our practice and receives outpatient dialysis treatments at [facility] on a MWF schedule, supervised by Dr. Candiss Norse.    Patient seen and evaluated at bedside Currently on room air Reports poor appetite but currently hungry Denies pain and discomfort Continues to complain of weakness   Objective:  Vital signs in last 24 hours:  Temp:  [97.9 F (36.6 C)-99.6 F (37.6 C)] 98.1 F (36.7 C) (02/21 1150) Pulse Rate:  [80-88] 83 (02/21 1152) Resp:  [11-29] 20 (02/21 1150) BP: (92-178)/(61-90) 92/62 (02/21 1152) SpO2:  [95 %-100 %] 95 % (02/21 1152) Weight:  [64.7 kg-67.4 kg] 64.7 kg (02/21 0510)  Weight change: 3.896 kg Filed Weights   08/08/21 1626 08/08/21 1948 08/09/21 0510  Weight: 67.4 kg 66.6 kg 64.7 kg    Intake/Output: I/O last 3 completed shifts: In: 792 [P.O.:767; I.V.:25] Out: 2948 [Urine:1945; Other:1003]   Intake/Output this shift:  Total I/O In: -  Out: 450 [Urine:450]  Physical Exam: General: NAD  Head: Normocephalic, atraumatic. Moist oral mucosal membranes  Eyes: Anicteric  Lungs:  Mild basilar crackles, normal effort  Heart: Regular rate and rhythm  Abdomen:  Soft, nontender  Extremities: Trace peripheral edema.  Neurologic: Nonfocal, moving all four extremities  Skin:  No lesions  Access: Right IJ PermCath    Basic Metabolic Panel: Recent Labs  Lab 08/08/21 0004 08/09/21 0547  NA 133* 134*  K 4.6 3.6  CL 103 97*  CO2 23 26  GLUCOSE 504* 136*  BUN 35* 25*  CREATININE 3.81* 3.33*  CALCIUM 8.8* 8.6*     Liver Function Tests: Recent Labs  Lab 08/08/21 0004  AST 27  ALT 25  ALKPHOS 131*  BILITOT 1.0  PROT 7.4  ALBUMIN 3.8    No results for input(s): LIPASE, AMYLASE in the last 168 hours. No results for input(s): AMMONIA in the last 168 hours.  CBC: Recent Labs  Lab 08/08/21 0004 08/09/21 0547  WBC 7.3 4.6  NEUTROABS 6.0  --   HGB 9.4* 10.0*  HCT 28.9* 29.2*  MCV 89.2 86.6  PLT 223 190     Cardiac Enzymes: No results for input(s): CKTOTAL, CKMB, CKMBINDEX, TROPONINI in the last 168 hours.  BNP: Invalid input(s): POCBNP  CBG: Recent Labs  Lab 08/08/21 1601 08/08/21 2039 08/08/21 2057 08/09/21 0815 08/09/21 1143  GLUCAP 153* 60* 85 149* 216*     Microbiology: Results for orders placed or performed during the hospital encounter of 08/08/21  Culture, blood (Routine x 2)     Status: None (Preliminary result)   Collection Time: 08/08/21 12:04 AM   Specimen: Left Antecubital; Blood  Result Value Ref Range Status   Specimen Description LEFT ANTECUBITAL  Final   Special Requests   Final    BOTTLES DRAWN AEROBIC AND ANAEROBIC Blood Culture results may not be optimal due to an excessive volume of blood received in culture bottles   Culture   Final  NO GROWTH 1 DAY Performed at [facility], [address] [place], [place] [address]    Report Status PENDING  Incomplete  Resp Panel by RT-PCR (Flu A&B, Covid) Nasopharyngeal Swab     Status: Abnormal   Collection Time: 08/08/21 12:13 AM   Specimen: Nasopharyngeal Swab; Nasopharyngeal(NP) swabs in vial transport medium  Result Value Ref Range Status   SARS Coronavirus 2 by RT PCR POSITIVE (A) NEGATIVE Final    Comment: (NOTE) SARS-CoV-2 target nucleic  acids are DETECTED.  The SARS-CoV-2 RNA is generally detectable in upper respiratory specimens during the acute phase of infection. Positive results are indicative of the presence of the identified virus, but do not rule out bacterial infection or co-infection with other pathogens not detected by the test. Clinical correlation with patient history and other diagnostic information is necessary to determine patient infection status. The expected result is Negative.  Fact Sheet for Patients: EntrepreneurPulse.com.au  Fact Sheet for Healthcare Providers: IncredibleEmployment.be  This test is not yet approved or cleared by the [place] FDA and  has been authorized for detection and/or diagnosis of SARS-CoV-2 by FDA under an Emergency Use Authorization (EUA).  This EUA will remain in effect (meaning this test can be used) for the duration of  the COVID-19 declaration under Section 564(b)(1) of the A ct, 21 U.S.C. section 360bbb-3(b)(1), unless the authorization is terminated or revoked sooner.     Influenza A by PCR NEGATIVE NEGATIVE Final   Influenza B by PCR NEGATIVE NEGATIVE Final    Comment: (NOTE) The Xpert Xpress SARS-CoV-2/FLU/RSV plus assay is intended as an aid in the diagnosis of influenza from Nasopharyngeal swab specimens and should not be used as a sole basis for treatment. Nasal washings and aspirates are unacceptable for Xpert Xpress SARS-CoV-2/FLU/RSV testing.  Fact Sheet for Patients: EntrepreneurPulse.com.au  Fact Sheet for Healthcare Providers: IncredibleEmployment.be  This test is not yet approved or cleared by the [place] FDA and has been authorized for detection and/or diagnosis of SARS-CoV-2 by FDA under an Emergency Use Authorization (EUA). This EUA will remain in effect (meaning this test can be used) for the duration of the COVID-19 declaration under Section 564(b)(1) of  the Act, 21 U.S.C. section 360bbb-3(b)(1), unless the authorization is terminated or revoked.  Performed at [facility], [address] [place], [place] [address]   Culture, blood (Routine x 2)     Status: None (Preliminary result)   Collection Time: 08/08/21  1:50 AM   Specimen: BLOOD  Result Value Ref Range Status   Specimen Description BLOOD LEFT  Final   Special Requests   Final    BOTTLES DRAWN AEROBIC AND ANAEROBIC Blood Culture adequate volume   Culture   Final    NO GROWTH 1 DAY Performed at [facility], [address] [place], [place] [address]    Report Status PENDING  Incomplete    Coagulation Studies: Recent Labs    08/08/21 0004  LABPROT 15.4*  INR 1.2     Urinalysis: No results for input(s): COLORURINE, LABSPEC, PHURINE, GLUCOSEU, HGBUR, BILIRUBINUR, KETONESUR, PROTEINUR, UROBILINOGEN, NITRITE, LEUKOCYTESUR in the last 72 hours.  Invalid input(s): APPERANCEUR    Imaging: DG Chest 1 View  Result Date: 08/08/2021 CLINICAL DATA:  Cough and fevers EXAM: PORTABLE CHEST 1 VIEW COMPARISON:  05/31/2021 FINDINGS: Cardiac shadow is enlarged but stable. Dialysis catheter is noted in satisfactory position. Mild vascular congestion is again noted the related to fluid overload. Interstitial edema is noted. No bony abnormality  is seen. IMPRESSION: CHF with interstitial edema stable from the prior exam Electronically Signed   By: Inez Catalina M.D.   On: 08/08/2021 00:30   ECHOCARDIOGRAM LIMITED  Result Date: 08/08/2021    ECHOCARDIOGRAM LIMITED REPORT   Patient Name:   RODY KEADLE Date of Exam: 08/08/2021 Medical Rec #:  [id number]     Height:       66.0 in Accession #:    [id number]    Weight:       150.4 lb Date of Birth:  06-13-1961     BSA:          1.771 m Patient Age:    71 years      BP:           152/87 mmHg Patient Gender: M             HR:           84 bpm. Exam Location:  [facility] Procedure: Limited Echo, Limited Color Doppler, Cardiac Doppler  and Strain            Analysis Indications:     I21.4 NSTEMI  History:         Patient has prior history of Echocardiogram examinations, most                  recent 07/15/2021. HFrEF, CKD, stage IV; Risk                  Factors:Hypertension and Diabetes. Pt tested positive for                  COVID-19 on 08/08/21.  Sonographer:     Charmayne Sheer Referring Phys:  [id number] Athena Masse Diagnosing Phys: [name] MD  Sonographer Comments: Global longitudinal strain was attempted. IMPRESSIONS  1. Left ventricular ejection fraction, by estimation, is 40 to 45%. The left ventricle has mildly decreased function. The left ventricle demonstrates global hypokinesis. The left ventricular internal cavity size was mildly dilated. There is mild left ventricular hypertrophy. Left ventricular diastolic function could not be evaluated. The average left ventricular global longitudinal strain is -8.6 %. The global longitudinal strain is abnormal.  2. Right ventricular systolic function is normal. The right ventricular size is normal. Tricuspid regurgitation signal is inadequate for assessing PA pressure.  3. Left atrial size was mildly dilated.  4. The mitral valve is normal in structure. No evidence of mitral valve regurgitation. No evidence of mitral stenosis.  5. The aortic valve is normal in structure. Aortic valve regurgitation is not visualized. Aortic valve sclerosis is present, with no evidence of aortic valve stenosis.  6. The inferior vena cava is normal in size with greater than 50% respiratory variability, suggesting right atrial pressure of 3 mmHg. FINDINGS  Left Ventricle: Left ventricular ejection fraction, by estimation, is 40 to 45%. The left ventricle has mildly decreased function. The left ventricle demonstrates global hypokinesis. The average left ventricular global longitudinal strain is -8.6 %. The  global longitudinal strain is abnormal. The left ventricular internal cavity size was mildly dilated. There is  mild left ventricular hypertrophy. Left ventricular diastolic function could not be evaluated. Right Ventricle: The right ventricular size is normal. No increase in right ventricular wall thickness. Right ventricular systolic function is normal. Tricuspid regurgitation signal is inadequate for assessing PA pressure. Left Atrium: Left atrial size was mildly dilated. Right Atrium: Right atrial size was normal in size. Pericardium: There is no evidence of pericardial effusion.  Mitral Valve: The mitral valve is normal in structure. Mild mitral annular calcification. No evidence of mitral valve stenosis. Tricuspid Valve: The tricuspid valve is normal in structure. Tricuspid valve regurgitation is not demonstrated. No evidence of tricuspid stenosis. Aortic Valve: The aortic valve is normal in structure. Aortic valve regurgitation is not visualized. Aortic valve sclerosis is present, with no evidence of aortic valve stenosis. Pulmonic Valve: The pulmonic valve was normal in structure. Pulmonic valve regurgitation is not visualized. No evidence of pulmonic stenosis. Aorta: The aortic root is normal in size and structure. Venous: The inferior vena cava is normal in size with greater than 50% respiratory variability, suggesting right atrial pressure of 3 mmHg. IAS/Shunts: No atrial level shunt detected by color flow Doppler. LEFT VENTRICLE PLAX 2D LVIDd:         5.26 cm LVIDs:         4.13 cm 2D Longitudinal Strain LV PW:         1.38 cm 2D Strain GLS Avg:     -8.6 % LV IVS:        1.05 cm  LEFT ATRIUM         Index LA diam:    4.40 cm 2.48 cm/m [name] MD Electronically signed by [name] MD Signature Date/Time: 08/08/2021/10:23:08 AM    Final      Medications:    sodium chloride     sodium chloride     heparin 900 Units/hr (08/09/21 0200)   remdesivir 100 mg in NS 100 mL Stopped (08/09/21 0933)    albuterol  2 puff Inhalation Q6H   apixaban  5 mg Oral BID   aspirin EC  81 mg Oral Daily    atorvastatin  80 mg Oral Daily   carvedilol  25 mg Oral BID WC   Chlorhexidine Gluconate Cloth  6 each Topical Q0600   furosemide  40 mg Intravenous BID   hydrALAZINE  100 mg Oral Q8H   insulin aspart  0-9 Units Subcutaneous TID WC   insulin detemir  8 Units Subcutaneous QHS   isosorbide mononitrate  30 mg Oral Daily   sodium chloride, sodium chloride, acetaminophen, alteplase, chlorpheniramine-HYDROcodone, guaiFENesin-dextromethorphan, heparin, lidocaine (PF), lidocaine-prilocaine, nitroGLYCERIN, ondansetron (ZOFRAN) IV, oxyCODONE, pentafluoroprop-tetrafluoroeth  Assessment/ Plan:  Mr. Jimmy Olson is a 60 y.o.  male with past medical conditions including anemia, two-vessel CAD, diabetes, depression, DVT with Eliquis, and end-stage renal disease on hemodialysis.  Patient presents to the emergency department with complaints of shortness of breath, cough and nasal congestion.  Patient was admitted for Hyperglycemia [R73.9] NSTEMI (non-ST elevated myocardial infarction) ([place]) [I21.4] Acute respiratory failure with hypoxia (HCC) [J96.01] Acute on chronic congestive heart failure, unspecified heart failure type ([place]) [I50.9] Acute sepsis ([place]) [A41.9] COVID-19 [U07.1]  [facility]/MWF/right IJ PermCath  End-stage renal disease on hemodialysis.  Will maintain outpatient schedule if possible.  Dialysis received yesterday, UF goal 1 L achieved.  Next treatment scheduled for tomorrow.  Dialysis coordinator aware of patient and COVID status, will make the necessary arrangements to continue treatment outpatient at discharge  2. Anemia of chronic kidney disease  Normocytic Lab Results  Component Value Date   HGB 10.0 (L) 08/09/2021  Hemoglobin remains within acceptable range.  Micera received outpatient  3. Secondary Hyperparathyroidism:  Lab Results  Component Value Date   CALCIUM 8.6 (L) 08/09/2021   PHOS 5.3 (H) 06/05/2021  We will continue to monitor bone mineral  metabolism during this admission  4. Diabetes mellitus type  II with chronic kidney disease insulin dependent. Home regimen includes Humalog and Levemir. Most recent hemoglobin A1c is 10.1 on 07/14/21.    5.  Hypertension with chronic kidney disease.  Home regimen includes furosemide, carvedilol, hydralazine, and isosorbide.  Currently receiving these medications.  Blood pressure currently soft 92/62.    LOS: [place] 2/21/20232:05 PM

## 2021-08-09 NOTE — Progress Notes (Signed)
Progress Note    Jimmy Olson  MEQ:[id number] DOB: 06-04-1961  DOA: 08/08/2021 PCP: [facility] [place]      Brief Narrative:    Medical records reviewed and are as summarized below:  Jimmy Olson is a 61 y.o. male with medical history significant for insulin-dependent type 2 diabetes, chronic systolic and diastolic CHF (EF 45 to 29%, G1DD 07/15/21), ESRD initiated on HD 05/2021, anemia of CKD,  two-vessel CAD on LHC 06/2020, depression, DVT of axillary vein 05/2021 on Eliquis, recently hospitalized from 1/26 to 1/27 with a TIA, with MRA neck showing ulcerated plaque proximal left ICA with 50 to 60% stenosis.  He presented to the hospital because of dry cough, nasal congestion and shortness of breath.  He is vaccinated against influenza but not COVID.     Assessment/Plan:   Principal Problem:   Acute on chronic combined systolic and diastolic CHF (congestive heart failure) (HCC) Active Problems:   Hypertensive emergency   Acute respiratory failure with hypoxia ([place])   Uncontrolled type 2 diabetes mellitus with hyperglycemia, with long-term current use of insulin (HCC)   ESRD on hemodialysis ([place])   NSTEMI (non-ST elevated myocardial infarction) ([place])   History of DVT (deep vein thrombosis)   Chronic anticoagulation   COVID-19 virus infection   Anemia of chronic kidney failure, stage 5 (HCC)    Body mass index is 23.02 kg/m.   Acute on chronic combined systolic and diastolic CHF: Continue IV Lasix.  Monitor BMP, daily weight and urine output. 2D echo in January 2023 showed EF estimated at 45 to 79%, grade 1 diastolic dysfunction.  Elevated troponin suspected to be from demand ischemia: Troponins 983 to 3,577.  Continue with IV heparin infusion for total of 48 hours by cardiologist.  Plan for cardiac cath to be arranged in the outpatient setting.  Follow-up with cardiologist for further recommendations.    Acute hypoxic respiratory failure: Improved.   He is tolerating room air.  Hypertensive emergency: BP is better.  Continue antihypertensives  ESRD, anemia of chronic disease, secondary hyperparathyroidism: Plan for hemodialysis tomorrow.  Follow-up with nephrologist  Type II DM with severe hyperglycemia, hypoglycemia on 08/08/2021: Glucose dropped to 60 last night.  Decrease Levemir from 20 to 8 units nightly.  Use NovoLog as needed for hyperglycemia  COVID-19 infection: It is not clear if he has pneumonia from COVID-19 infection.  Continue IV remdesivir  Left axillary vein DVT diagnosed on 06/02/2021: Eliquis held because he is on IV heparin   Diet Order             Diet heart healthy/carb modified Room service appropriate? Yes; Fluid consistency: Thin  Diet effective now                      Consultants: Nephrologist Cardiologist  Procedures: None    Medications:    albuterol  2 puff Inhalation Q6H   apixaban  5 mg Oral BID   aspirin EC  81 mg Oral Daily   atorvastatin  80 mg Oral Daily   carvedilol  25 mg Oral BID WC   Chlorhexidine Gluconate Cloth  6 each Topical Q0600   furosemide  40 mg Intravenous BID   hydrALAZINE  100 mg Oral Q8H   insulin aspart  0-9 Units Subcutaneous TID WC   insulin detemir  8 Units Subcutaneous QHS   isosorbide mononitrate  30 mg Oral Daily   Continuous Infusions:  sodium chloride  sodium chloride     heparin 900 Units/hr (08/09/21 0200)   remdesivir 100 mg in NS 100 mL Stopped (08/09/21 0933)     Anti-infectives (From admission, onward)    Start     Dose/Rate Route Frequency Ordered Stop   08/09/21 1000  remdesivir 100 mg in sodium chloride 0.9 % 100 mL IVPB       See Hyperspace for full Linked Orders Report.   100 mg 200 mL/hr over 30 Minutes Intravenous Daily 08/08/21 0226 08/11/21 0959   08/09/21 1000  remdesivir 100 mg in sodium chloride 0.9 % 100 mL IVPB  Status:  Discontinued       See Hyperspace for full Linked Orders Report.   100 mg 200 mL/hr over 30  Minutes Intravenous Daily 08/08/21 0337 08/08/21 0340   08/08/21 0345  remdesivir 200 mg in sodium chloride 0.9% 250 mL IVPB  Status:  Discontinued       See Hyperspace for full Linked Orders Report.   200 mg 580 mL/hr over 30 Minutes Intravenous Once 08/08/21 7741 08/08/21 0340   08/08/21 0230  remdesivir 200 mg in sodium chloride 0.9% 250 mL IVPB       See Hyperspace for full Linked Orders Report.   200 mg 580 mL/hr over 30 Minutes Intravenous Once 08/08/21 0226 08/08/21 0322   08/08/21 0030  ceFEPIme (MAXIPIME) 2 g in sodium chloride 0.9 % 100 mL IVPB        2 g 200 mL/hr over 30 Minutes Intravenous  Once 08/08/21 0023 08/08/21 0244   08/08/21 0030  metroNIDAZOLE (FLAGYL) IVPB 500 mg        500 mg 100 mL/hr over 60 Minutes Intravenous  Once 08/08/21 0023 08/08/21 0147   08/08/21 0030  vancomycin (VANCOCIN) IVPB 1000 mg/200 mL premix  Status:  Discontinued        1,000 mg 200 mL/hr over 60 Minutes Intravenous  Once 08/08/21 0023 08/08/21 0026   08/08/21 0030  vancomycin (VANCOREADY) IVPB 1500 mg/300 mL        1,500 mg 150 mL/hr over 120 Minutes Intravenous  Once 08/08/21 0026 08/08/21 0346              Family Communication/Anticipated D/C date and plan/Code Status   DVT prophylaxis:  apixaban (ELIQUIS) tablet 5 mg     Code Status: Full Code  Family Communication: None Disposition Plan: Plan to discharge home in 2 to 3 days   Status is: Inpatient Remains inpatient appropriate because:  ? NSTEMI, CHF exacerbation, COVID-19 infection               Subjective:   C/o shortness of breath and right buttock pain from sciatica.  Objective:    Vitals:   08/09/21 0951 08/09/21 1007 08/09/21 1150 08/09/21 1152  BP:   105/62 92/62  Pulse:  81 83 83  Resp:   20   Temp:   98.1 F (36.7 C)   TempSrc:   Oral   SpO2: 99% 99% 97% 95%  Weight:      Height:       No data found.   Intake/Output Summary (Last 24 hours) at 08/09/2021 1403 Last data filed at  08/09/2021 0903 Gross per 24 hour  Intake 240 ml  Output 1973 ml  Net -1733 ml   Filed Weights   08/08/21 1626 08/08/21 1948 08/09/21 0510  Weight: 67.4 kg 66.6 kg 64.7 kg    Exam:  GEN: NAD SKIN: No rash EYES: EOMI ENT: MMM  CV: RRR PULM: CTA B ABD: soft, ND, NT, +BS CNS: AAO x 3, non focal EXT: No edema or tenderness MSK: No spinal tenderness       Data Reviewed:   I have personally reviewed following labs and imaging studies:  Labs: Labs show the following:   Basic Metabolic Panel: Recent Labs  Lab 08/08/21 0004 08/09/21 0547  NA 133* 134*  K 4.6 3.6  CL 103 97*  CO2 23 26  GLUCOSE 504* 136*  BUN 35* 25*  CREATININE 3.81* 3.33*  CALCIUM 8.8* 8.6*   GFR Estimated Creatinine Clearance: 21.8 mL/min (A) (by C-G formula based on SCr of 3.33 mg/dL (H)). Liver Function Tests: Recent Labs  Lab 08/08/21 0004  AST 27  ALT 25  ALKPHOS 131*  BILITOT 1.0  PROT 7.4  ALBUMIN 3.8   No results for input(s): LIPASE, AMYLASE in the last 168 hours. No results for input(s): AMMONIA in the last 168 hours. Coagulation profile Recent Labs  Lab 08/08/21 0004  INR 1.2    CBC: Recent Labs  Lab 08/08/21 0004 08/09/21 0547  WBC 7.3 4.6  NEUTROABS 6.0  --   HGB 9.4* 10.0*  HCT 28.9* 29.2*  MCV 89.2 86.6  PLT 223 190   Cardiac Enzymes: No results for input(s): CKTOTAL, CKMB, CKMBINDEX, TROPONINI in the last 168 hours. BNP (last 3 results) No results for input(s): PROBNP in the last 8760 hours. CBG: Recent Labs  Lab 08/08/21 1601 08/08/21 2039 08/08/21 2057 08/09/21 0815 08/09/21 1143  GLUCAP 153* 60* 85 149* 216*   D-Dimer: No results for input(s): DDIMER in the last 72 hours. Hgb A1c: No results for input(s): HGBA1C in the last 72 hours. Lipid Profile: Recent Labs    08/08/21 0424  CHOL 90  HDL 35*  LDLCALC 46  TRIG 47  CHOLHDL 2.6   Thyroid function studies: No results for input(s): TSH, T4TOTAL, T3FREE, THYROIDAB in the last 72  hours.  Invalid input(s): FREET3 Anemia work up: No results for input(s): VITAMINB12, FOLATE, FERRITIN, TIBC, IRON, RETICCTPCT in the last 72 hours. Sepsis Labs: Recent Labs  Lab 08/08/21 0004 08/08/21 0013 08/08/21 0115 08/09/21 0547  PROCALCITON  --  0.15  --   --   WBC 7.3  --   --  4.6  LATICACIDVEN  --   --  0.7  --     Microbiology Recent Results (from the past 240 hour(s))  Culture, blood (Routine x 2)     Status: None (Preliminary result)   Collection Time: 08/08/21 12:04 AM   Specimen: Left Antecubital; Blood  Result Value Ref Range Status   Specimen Description LEFT ANTECUBITAL  Final   Special Requests   Final    BOTTLES DRAWN AEROBIC AND ANAEROBIC Blood Culture results may not be optimal due to an excessive volume of blood received in culture bottles   Culture   Final    NO GROWTH 1 DAY Performed at [facility], [address], [place],  [address]    Report Status PENDING  Incomplete  Resp Panel by RT-PCR (Flu A&B, Covid) Nasopharyngeal Swab     Status: Abnormal   Collection Time: 08/08/21 12:13 AM   Specimen: Nasopharyngeal Swab; Nasopharyngeal(NP) swabs in vial transport medium  Result Value Ref Range Status   SARS Coronavirus 2 by RT PCR POSITIVE (A) NEGATIVE Final    Comment: (NOTE) SARS-CoV-2 target nucleic acids are DETECTED.  The SARS-CoV-2 RNA is generally detectable in upper respiratory specimens during the acute phase  of infection. Positive results are indicative of the presence of the identified virus, but do not rule out bacterial infection or co-infection with other pathogens not detected by the test. Clinical correlation with patient history and other diagnostic information is necessary to determine patient infection status. The expected result is Negative.  Fact Sheet for Patients: EntrepreneurPulse.com.au  Fact Sheet for Healthcare Providers: IncredibleEmployment.be  This test is not  yet approved or cleared by the [place] FDA and  has been authorized for detection and/or diagnosis of SARS-CoV-2 by FDA under an Emergency Use Authorization (EUA).  This EUA will remain in effect (meaning this test can be used) for the duration of  the COVID-19 declaration under Section 564(b)(1) of the A ct, 21 U.S.C. section 360bbb-3(b)(1), unless the authorization is terminated or revoked sooner.     Influenza A by PCR NEGATIVE NEGATIVE Final   Influenza B by PCR NEGATIVE NEGATIVE Final    Comment: (NOTE) The Xpert Xpress SARS-CoV-2/FLU/RSV plus assay is intended as an aid in the diagnosis of influenza from Nasopharyngeal swab specimens and should not be used as a sole basis for treatment. Nasal washings and aspirates are unacceptable for Xpert Xpress SARS-CoV-2/FLU/RSV testing.  Fact Sheet for Patients: EntrepreneurPulse.com.au  Fact Sheet for Healthcare Providers: IncredibleEmployment.be  This test is not yet approved or cleared by the [place] FDA and has been authorized for detection and/or diagnosis of SARS-CoV-2 by FDA under an Emergency Use Authorization (EUA). This EUA will remain in effect (meaning this test can be used) for the duration of the COVID-19 declaration under Section 564(b)(1) of the Act, 21 U.S.C. section 360bbb-3(b)(1), unless the authorization is terminated or revoked.  Performed at [facility], [address] [place], [place] [address]   Culture, blood (Routine x 2)     Status: None (Preliminary result)   Collection Time: 08/08/21  1:50 AM   Specimen: BLOOD  Result Value Ref Range Status   Specimen Description BLOOD LEFT  Final   Special Requests   Final    BOTTLES DRAWN AEROBIC AND ANAEROBIC Blood Culture adequate volume   Culture   Final    NO GROWTH 1 DAY Performed at [facility], [address], [place], [place] [address]    Report Status PENDING  Incomplete     Procedures and diagnostic studies:  DG Chest 1 View  Result Date: 08/08/2021 CLINICAL DATA:  Cough and fevers EXAM: PORTABLE CHEST 1 VIEW COMPARISON:  05/31/2021 FINDINGS: Cardiac shadow is enlarged but stable. Dialysis catheter is noted in satisfactory position. Mild vascular congestion is again noted the related to fluid overload. Interstitial edema is noted. No bony abnormality is seen. IMPRESSION: CHF with interstitial edema stable from the prior exam Electronically Signed   By: Inez Catalina M.D.   On: 08/08/2021 00:30   ECHOCARDIOGRAM LIMITED  Result Date: 08/08/2021    ECHOCARDIOGRAM LIMITED REPORT   Patient Name:   HIPOLITO MARTINEZLOPEZ Date of Exam: 08/08/2021 Medical Rec #:  [id number]     Height:       66.0 in Accession #:    [id number]    Weight:       150.4 lb Date of Birth:  [birth year]     BSA:          1.771 m Patient Age:    60 years      BP:           152/87 mmHg Patient Gender: M  HR:           84 bpm. Exam Location:  [facility] Procedure: Limited Echo, Limited Color Doppler, Cardiac Doppler and Strain            Analysis Indications:     I21.4 NSTEMI  History:         Patient has prior history of Echocardiogram examinations, most                  recent 07/15/2021. HFrEF, CKD, stage IV; Risk                  Factors:Hypertension and Diabetes. Pt tested positive for                  COVID-19 on 08/08/21.  Sonographer:     Charmayne Sheer Referring Phys:  [id number] Athena Masse Diagnosing Phys: [name] MD  Sonographer Comments: Global longitudinal strain was attempted. IMPRESSIONS  1. Left ventricular ejection fraction, by estimation, is 40 to 45%. The left ventricle has mildly decreased function. The left ventricle demonstrates global hypokinesis. The left ventricular internal cavity size was mildly dilated. There is mild left ventricular hypertrophy. Left ventricular diastolic function could not be evaluated. The average left ventricular global longitudinal strain is -8.6 %. The global  longitudinal strain is abnormal.  2. Right ventricular systolic function is normal. The right ventricular size is normal. Tricuspid regurgitation signal is inadequate for assessing PA pressure.  3. Left atrial size was mildly dilated.  4. The mitral valve is normal in structure. No evidence of mitral valve regurgitation. No evidence of mitral stenosis.  5. The aortic valve is normal in structure. Aortic valve regurgitation is not visualized. Aortic valve sclerosis is present, with no evidence of aortic valve stenosis.  6. The inferior vena cava is normal in size with greater than 50% respiratory variability, suggesting right atrial pressure of 3 mmHg. FINDINGS  Left Ventricle: Left ventricular ejection fraction, by estimation, is 40 to 45%. The left ventricle has mildly decreased function. The left ventricle demonstrates global hypokinesis. The average left ventricular global longitudinal strain is -8.6 %. The  global longitudinal strain is abnormal. The left ventricular internal cavity size was mildly dilated. There is mild left ventricular hypertrophy. Left ventricular diastolic function could not be evaluated. Right Ventricle: The right ventricular size is normal. No increase in right ventricular wall thickness. Right ventricular systolic function is normal. Tricuspid regurgitation signal is inadequate for assessing PA pressure. Left Atrium: Left atrial size was mildly dilated. Right Atrium: Right atrial size was normal in size. Pericardium: There is no evidence of pericardial effusion. Mitral Valve: The mitral valve is normal in structure. Mild mitral annular calcification. No evidence of mitral valve stenosis. Tricuspid Valve: The tricuspid valve is normal in structure. Tricuspid valve regurgitation is not demonstrated. No evidence of tricuspid stenosis. Aortic Valve: The aortic valve is normal in structure. Aortic valve regurgitation is not visualized. Aortic valve sclerosis is present, with no evidence of  aortic valve stenosis. Pulmonic Valve: The pulmonic valve was normal in structure. Pulmonic valve regurgitation is not visualized. No evidence of pulmonic stenosis. Aorta: The aortic root is normal in size and structure. Venous: The inferior vena cava is normal in size with greater than 50% respiratory variability, suggesting right atrial pressure of 3 mmHg. IAS/Shunts: No atrial level shunt detected by color flow Doppler. LEFT VENTRICLE PLAX 2D LVIDd:         5.26 cm LVIDs:  4.13 cm 2D Longitudinal Strain LV PW:         1.38 cm 2D Strain GLS Avg:     -8.6 % LV IVS:        1.05 cm  LEFT ATRIUM         Index LA diam:    4.40 cm 2.48 cm/m [name] MD Electronically signed by [name] MD Signature Date/Time: 08/08/2021/10:23:08 AM    Final                LOS: 1 day   Bassel Gaskill  [facility]   Pager on [email or web address]. If 7PM-7AM, please contact night-coverage at [email or web address]     08/09/2021, 2:03 PM

## 2021-08-09 NOTE — Progress Notes (Addendum)
Hemodialysis patient known at [facility] MWF 12:00pm. Due to being COVID positive, patient will treat in isolation at clinic, schedule will be TTS 9:30am.

## 2021-08-09 NOTE — Progress Notes (Signed)
[place] for Heparin  Indication: chest pain/ACS  No Known Allergies  Patient Measurements: Height: 5\' 6"  (167.6 cm) Weight: 64.7 kg (142 lb 10.2 oz) IBW/kg (Calculated) : 63.8 Heparin Dosing Weight: 63.5 kg   Labs: Recent Labs    08/08/21 0004 08/08/21 0023 08/08/21 0227 08/08/21 1107 08/08/21 1854 08/09/21 0547  HGB 9.4*  --   --   --   --  10.0*  HCT 28.9*  --   --   --   --  29.2*  PLT 223  --   --   --   --  190  APTT 33  --   --  77* 57* 90*  LABPROT 15.4*  --   --   --   --   --   INR 1.2  --   --   --   --   --   HEPARINUNFRC >1.10*  --   --   --   --   --   CREATININE 3.81*  --   --   --   --  3.33*  TROPONINIHS  --  983* 3,577*  --   --   --      Estimated Creatinine Clearance: 21.8 mL/min (A) (by C-G formula based on SCr of 3.33 mg/dL (H)).  Medications:  Medications Prior to Admission  Medication Sig Dispense Refill Last Dose   apixaban (ELIQUIS) 5 MG TABS tablet TAKE 1 TABLET BY MOUTH TWICE DAILY. 180 tablet 1 08/07/2021 at 0900   carvedilol (COREG) 25 MG tablet Take 1 tablet (25 mg total) by mouth 2 (two) times daily. 60 tablet 6 08/07/2021 at 0900   HUMALOG KWIKPEN 100 UNIT/ML KwikPen Inject 3 Units into the skin 3 (three) times daily.   08/07/2021   hydrALAZINE (APRESOLINE) 100 MG tablet Take 1 tablet (100 mg total) by mouth once every 8 (eight) hours. 90 tablet 6 08/07/2021 at 0900   insulin detemir (LEVEMIR FLEXTOUCH) 100 UNIT/ML FlexPen Inject 20 Units into the skin once daily at bedtime. 15 mL 5 08/07/2021 at 2100   isosorbide mononitrate (IMDUR) 30 MG 24 hr tablet Take 1 tablet (30 mg total) by mouth in the morning and at bedtime. 60 tablet 6 08/07/2021 at 2100   rosuvastatin (CRESTOR) 40 MG tablet TAKE 1 TABLET BY MOUTH ONCE DAILY. 90 tablet 3 08/07/2021 at 2100   acetaminophen (TYLENOL) 325 MG tablet Take 325 mg by mouth every 6 (six) hours as needed. (Patient not taking: Reported on 08/04/2021)      albuterol  (PROVENTIL HFA) 108 (90 Base) MCG/ACT inhaler INHALE 2 PUFFS INTO THE LUNGS ONCE EVERY 6 HOURS AS NEEDED FOR WHEEZING OR SHORTNESS OF BREATH. (Patient not taking: Reported on 08/04/2021) 6.7 g 10    cetirizine (ZYRTEC) 10 MG tablet Take 10 mg by mouth daily as needed for allergies.   prn at unknown   D3-50 1.25 MG (50000 UT) capsule Take 50,000 Units by mouth once a week. (Patient not taking: Reported on 08/04/2021)      furosemide (LASIX) 80 MG tablet Take 80 mg daily as needed on nondialysis days for swelling or shortness of breath (Patient not taking: Reported on 08/04/2021) 90 tablet 3    gabapentin (NEURONTIN) 300 MG capsule Take 1 capsule (300 mg total) by mouth at bedtime for 7 days. 7 capsule 0    Insulin Pen Needle 32G X 4 MM MISC USE AS DIRECTED 100 each 0    nitroGLYCERIN (NITROSTAT) 0.4 MG  SL tablet Place 1 tablet (0.4 mg total) under the tongue every 5 (five) minutes as needed for chest pain. 30 tablet 0 prn at unknown    Assessment: 61 year old male with a past medical history of diabetes, ESRD on HD, anemia of CKD, two-vessel CAD, depression, DVT 12/22 on eliquis, TIA 1/26 with MRA neck showing ulcerated plaque proximal left ICA with 50-60% stenosis who presented with nonproductive cough, nasal congestion, and shortness of breath. Patient was found to be COVID (+) with elevated troponin (3577 max), BNP (2160), and concern for NSTEMI. Pharmacy consulted to dose heparin for ACS/NSTEMI.    Pt was on Eliquis 5 mg PO BID PTA for DVT, last dose on 2/19 AM.  CrCl = 19 ml/min   Date     Time     aPTT/HL     Rate/Comment 0220  1107   77                750 u/hr; therapeutic 0220  1854    57           750 > 900 units/hr 2/21  0547   90 / Pending  Therapeutic x 1  Goal of Therapy:  Heparin level 0.3-0.7 units/ml aPTT 66 - 102 seconds Monitor platelets by anticoagulation protocol: Yes   Plan:  --Continue heparin infusion at 900 u/hr  --Recheck aPTT in 8 hrs to confirm --Will use aPTT to  dose heparin until HL and aPTT correlate --Will check HL and CBC daily per protocol   Renda Rolls, PharmD, [facility] 08/09/2021 6:38 AM

## 2021-08-09 NOTE — Progress Notes (Addendum)
Progress Note  Patient Name: Jimmy Olson Date of Encounter: 08/09/2021  Primary Cardiologist: Jimmy Olson  Subjective   No chest pain, shortness of breath, chest pain, or palpitations.  Tolerated HD well yesterday evening.  Inpatient Medications    Scheduled Meds:  albuterol  2 puff Inhalation Q6H   aspirin EC  81 mg Oral Daily   atorvastatin  80 mg Oral Daily   carvedilol  25 mg Oral BID WC   Chlorhexidine Gluconate Cloth  6 each Topical Q0600   furosemide  40 mg Intravenous BID   hydrALAZINE  100 mg Oral Q8H   insulin aspart  0-15 Units Subcutaneous TID WC   insulin aspart  0-5 Units Subcutaneous QHS   insulin detemir  20 Units Subcutaneous QHS   isosorbide mononitrate  30 mg Oral Daily   Continuous Infusions:  sodium chloride     sodium chloride     heparin 900 Units/hr (08/09/21 0200)   remdesivir 100 mg in NS 100 mL     PRN Meds: sodium chloride, sodium chloride, acetaminophen, alteplase, chlorpheniramine-HYDROcodone, guaiFENesin-dextromethorphan, heparin, lidocaine (PF), lidocaine-prilocaine, nitroGLYCERIN, ondansetron (ZOFRAN) IV, pentafluoroprop-tetrafluoroeth   Vital Signs    Vitals:   08/08/21 2353 08/09/21 0510 08/09/21 0511 08/09/21 0812  BP: 136/68  (!) 175/68 (!) 154/73  Pulse: 85  83 88  Resp: 15   19  Temp: 97.9 F (36.6 C)  98.9 F (37.2 C) 97.9 F (36.6 C)  TempSrc:      SpO2: 98%  100% 95%  Weight:  64.7 kg    Height:        Intake/Output Summary (Last 24 hours) at 08/09/2021 0825 Last data filed at 08/08/2021 2200 Gross per 24 hour  Intake 717 ml  Output 2048 ml  Net -1331 ml   Filed Weights   08/08/21 1626 08/08/21 1948 08/09/21 0510  Weight: 67.4 kg 66.6 kg 64.7 kg    Telemetry    SR with artifact - Personally Reviewed  ECG    No new tracings - Personally Reviewed  Physical Exam   GEN: No acute distress.   Neck: Right tunneled HD catheter in place.  No gross JVD. Cardiac: RRR, no murmurs, rubs, or gallops.   Respiratory: Clear to auscultation bilaterally.  GI: Soft, nontender, non-distended.   MS: No edema; No deformity. Neuro:  Alert and oriented x 3; Nonfocal.  Psych: Normal affect.  Labs    Chemistry Recent Labs  Lab 08/08/21 0004 08/09/21 0547  NA 133* 134*  K 4.6 3.6  CL 103 97*  CO2 23 26  GLUCOSE 504* 136*  BUN 35* 25*  CREATININE 3.81* 3.33*  CALCIUM 8.8* 8.6*  PROT 7.4  --   ALBUMIN 3.8  --   AST 27  --   ALT 25  --   ALKPHOS 131*  --   BILITOT 1.0  --   GFRNONAA 18* 21*  ANIONGAP 7 11     Hematology Recent Labs  Lab 08/08/21 0004 08/09/21 0547  WBC 7.3 4.6  RBC 3.24* 3.37*  HGB 9.4* 10.0*  HCT 28.9* 29.2*  MCV 89.2 86.6  MCH 29.0 29.7  MCHC 32.5 34.2  RDW 12.5 12.7  PLT 223 190    Cardiac EnzymesNo results for input(s): TROPONINI in the last 168 hours. No results for input(s): TROPIPOC in the last 168 hours.   BNP Recent Labs  Lab 08/08/21 0023  BNP 2,160.8*     DDimer No results for input(s): DDIMER in the last 168  hours.   Radiology    DG Chest 1 View  Result Date: 08/08/2021 IMPRESSION: CHF with interstitial edema stable from the prior exam Electronically Signed   By: Jimmy Olson M.D.   On: 08/08/2021 00:30     Cardiac Studies   Limited echo 08/08/2021: 1. Left ventricular ejection fraction, by estimation, is 40 to 45%. The  left ventricle has mildly decreased function. The left ventricle  demonstrates global hypokinesis. The left ventricular internal cavity size  was mildly dilated. There is mild left  ventricular hypertrophy. Left ventricular diastolic function could not be  evaluated. The average left ventricular global longitudinal strain is -8.6  %. The global longitudinal strain is abnormal.   2. Right ventricular systolic function is normal. The right ventricular  size is normal. Tricuspid regurgitation signal is inadequate for assessing  PA pressure.   3. Left atrial size was mildly dilated.   4. The mitral valve is  normal in structure. No evidence of mitral valve  regurgitation. No evidence of mitral stenosis.   5. The aortic valve is normal in structure. Aortic valve regurgitation is  not visualized. Aortic valve sclerosis is present, with no evidence of  aortic valve stenosis.   6. The inferior vena cava is normal in size with greater than 50%  respiratory variability, suggesting right atrial pressure of 3 mmHg. ___________  2D echo 07/15/2021: 1. Left ventricular ejection fraction, by estimation, is 45 to 50%. The  left ventricle has mildly decreased function. The left ventricle  demonstrates global hypokinesis. There is mild left ventricular  hypertrophy. Left ventricular diastolic parameters  are consistent with Grade I diastolic dysfunction (impaired relaxation).   2. Right ventricular systolic function is normal. The right ventricular  size is normal. Tricuspid regurgitation signal is inadequate for assessing  PA pressure.   3. The mitral valve is normal in structure. No evidence of mitral valve  regurgitation. No evidence of mitral stenosis.   4. The aortic valve was not well visualized. Aortic valve regurgitation  is not visualized. No aortic stenosis is present.   5. The inferior vena cava is normal in size with greater than 50%  respiratory variability, suggesting right atrial pressure of 3 mmHg. ___________  LHC 06/03/2021:   Prox LAD lesion is 40% stenosed.   Mid LAD lesion is 60% stenosed.   Prox RCA lesion is 20% stenosed.   Mid RCA lesion is 30% stenosed.   1st Diag lesion is 70% stenosed.   Dist RCA lesion is 30% stenosed.   RPDA lesion is 40% stenosed.   There is mild left ventricular systolic dysfunction.   LV Jimmy Olson diastolic pressure is mildly elevated.   The left ventricular ejection fraction is 45-50% by visual estimate.   1.  Heavily calcified coronary arteries with moderate two-vessel coronary artery disease involving the LAD and right coronary artery.  No culprit is  identified for non-STEMI. 2.  Mildly reduced LV systolic function and mildly elevated left ventricular Jimmy Olson-diastolic pressure.   Recommendations: The patient disease appears to be chronic with no acute culprit identified.  Suspect that elevated troponin is due to supply demand ischemia.  FFR guided PCI of the LAD can be considered in the future if the patient has refractory anginal symptoms.  For now, recommend aggressive medical therapy.   Patient Profile     61 y.o. male with history of 2- vessel CAD, HFrEF, ESRD on HD, IDDM, DVT in the left axillary vein in 05/2021 on Eliquis, anemia of chronic disease,  TIA, HTN, and HLD who was admitted with Covid with unvaccinated status, and we are seeing for demand ischemia.   Assessment & Plan    1. CAD involving the native coronary arteries with demand ischemia: -Currently, without chest pain -Elevated high sensitivity troponin felt to be supply demand ischemia in the setting of Covid infection, ESRD, anemia, hypertension, and tachycardia in the setting of underlying CAD -Though with noted elevation, cannot completely exclude a primary cardiac event -Continue heparin gtt for a total of 48 hours (will complete tonight; plan to transition to apixaban at 2200) -Given recurrent presentations with significantly elevated troponin in the setting of acute illnesses, it is worth interrogating his LAD stenosis with FFR with treatment if needed -However, pursuing this is not recommended at this time until he recovers from his current illness -We will plan on scheduling the procedure at [facility] as an outpatient once he recovers given that if the LAD is significant, it will require atherectomy considering the degree of calcifications -In addition, the first diagonal stenosis might need to be treated -Continue ASA and Lipitor   2. HFrEF secondary to combined ICM and NICM: -Volume management per HD; continue furosemide per nephrology -Continue  carvedilol and hydralazine/isosorbide mononitrate -Relative hypotension and ESRD status preclude further escalation of GDMT -Further ischemic evaluation planned as an outpatient as outlined above  3. HTN: -Blood pressure stable -Continue current medical therapy  4. HLD: -LDL 46 -PTA Lipitor 80 mg  5. Left axillary vein DVT: -Noted in 05/2021 -Heparin gtt for now; transition to apixaban tonight  For questions or updates, please contact [place] Please consult [email or web address] for contact info under [facility].  Signed, Nelva Bush, MD [facility] 08/09/2021, 8:25 AM

## 2021-08-09 NOTE — Progress Notes (Signed)
ANTICOAGULATION CONSULT NOTE   Pharmacy Consult for Heparin  Indication: chest pain/ACS  No Known Allergies  Patient Measurements: Height: 5\' 6"  (167.6 cm) Weight: 64.7 kg (142 lb 10.2 oz) IBW/kg (Calculated) : 63.8 Heparin Dosing Weight: 63.5 kg   Labs: Recent Labs    08/08/21 0004 08/08/21 0023 08/08/21 0227 08/08/21 1107 08/08/21 1854 08/09/21 0547 08/09/21 1356  HGB 9.4*  --   --   --   --  10.0*  --   HCT 28.9*  --   --   --   --  29.2*  --   PLT 223  --   --   --   --  190  --   APTT 33  --   --    < > 57* 90* 88*  LABPROT 15.4*  --   --   --   --   --   --   INR 1.2  --   --   --   --   --   --   HEPARINUNFRC >1.10*  --   --   --   --  >1.10*  --   CREATININE 3.81*  --   --   --   --  3.33*  --   TROPONINIHS  --  983* 3,577*  --   --   --   --    < > = values in this interval not displayed.     Estimated Creatinine Clearance: 21.8 mL/min (A) (by C-G formula based on SCr of 3.33 mg/dL (H)).  Medications:  Medications Prior to Admission  Medication Sig Dispense Refill Last Dose   apixaban (ELIQUIS) 5 MG TABS tablet TAKE 1 TABLET BY MOUTH TWICE DAILY. 180 tablet 1 08/07/2021 at 0900   carvedilol (COREG) 25 MG tablet Take 1 tablet (25 mg total) by mouth 2 (two) times daily. 60 tablet 6 08/07/2021 at 0900   HUMALOG KWIKPEN 100 UNIT/ML KwikPen Inject 3 Units into the skin 3 (three) times daily.   08/07/2021   hydrALAZINE (APRESOLINE) 100 MG tablet Take 1 tablet (100 mg total) by mouth once every 8 (eight) hours. 90 tablet 6 08/07/2021 at 0900   insulin detemir (LEVEMIR FLEXTOUCH) 100 UNIT/ML FlexPen Inject 20 Units into the skin once daily at bedtime. 15 mL 5 08/07/2021 at 2100   isosorbide mononitrate (IMDUR) 30 MG 24 hr tablet Take 1 tablet (30 mg total) by mouth in the morning and at bedtime. 60 tablet 6 08/07/2021 at 2100   rosuvastatin (CRESTOR) 40 MG tablet TAKE 1 TABLET BY MOUTH ONCE DAILY. 90 tablet 3 08/07/2021 at 2100   acetaminophen (TYLENOL) 325 MG tablet Take  325 mg by mouth every 6 (six) hours as needed. (Patient not taking: Reported on 08/04/2021)      albuterol (PROVENTIL HFA) 108 (90 Base) MCG/ACT inhaler INHALE 2 PUFFS INTO THE LUNGS ONCE EVERY 6 HOURS AS NEEDED FOR WHEEZING OR SHORTNESS OF BREATH. (Patient not taking: Reported on 08/04/2021) 6.7 g 10    cetirizine (ZYRTEC) 10 MG tablet Take 10 mg by mouth daily as needed for allergies.   prn at unknown   D3-50 1.25 MG (50000 UT) capsule Take 50,000 Units by mouth once a week. (Patient not taking: Reported on 08/04/2021)      furosemide (LASIX) 80 MG tablet Take 80 mg daily as needed on nondialysis days for swelling or shortness of breath (Patient not taking: Reported on 08/04/2021) 90 tablet 3    gabapentin (NEURONTIN) 300 MG capsule Take  1 capsule (300 mg total) by mouth at bedtime for 7 days. 7 capsule 0    Insulin Pen Needle 32G X 4 MM MISC USE AS DIRECTED 100 each 0    nitroGLYCERIN (NITROSTAT) 0.4 MG SL tablet Place 1 tablet (0.4 mg total) under the tongue every 5 (five) minutes as needed for chest pain. 30 tablet 0 prn at unknown    Assessment: 61 year old male with a past medical history of diabetes, ESRD on HD, anemia of CKD, two-vessel CAD, depression, DVT 12/22 on eliquis, TIA 1/26 with MRA neck showing ulcerated plaque proximal left ICA with 50-60% stenosis who presented with nonproductive cough, nasal congestion, and shortness of breath. Patient was found to be COVID (+) with elevated troponin (3577 max), BNP (2160), and concern for NSTEMI. Pharmacy consulted to dose heparin for ACS/NSTEMI.    Pt was on Eliquis 5 mg PO BID PTA for DVT, last dose on 2/19 AM.  CrCl = 19 ml/min   Date     Time     aPTT/HL     Rate/Comment 0220  1107   77                750 u/hr; therapeutic 0220  1854    57           750 > 900 units/hr 2/21  0547   90 / Pending  Therapeutic x 1 2/21  1356   88  Therapeutic x 2  Goal of Therapy:  Heparin level 0.3-0.7 units/ml aPTT 66 - 102 seconds Monitor platelets by  anticoagulation protocol: Yes   Plan:  --Continue heparin infusion at 900 u/hr until 2200, then transition to Eliquis. --Will check HL and CBC daily per protocol   Lucio Litsey Rodriguez-Smeltz PharmD, BCPS 08/09/2021 3:42 PM

## 2021-08-10 DIAGNOSIS — I214 Non-ST elevation (NSTEMI) myocardial infarction: Secondary | ICD-10-CM

## 2021-08-10 DIAGNOSIS — I5043 Acute on chronic combined systolic (congestive) and diastolic (congestive) heart failure: Secondary | ICD-10-CM | POA: Diagnosis not present

## 2021-08-10 LAB — CBC
HCT: 30.1 % — ABNORMAL LOW (ref 39.0–52.0)
Hemoglobin: 10 g/dL — ABNORMAL LOW (ref 13.0–17.0)
MCH: 28.7 pg (ref 26.0–34.0)
MCHC: 33.2 g/dL (ref 30.0–36.0)
MCV: 86.5 fL (ref 80.0–100.0)
Platelets: 179 10*3/uL (ref 150–400)
RBC: 3.48 MIL/uL — ABNORMAL LOW (ref 4.22–5.81)
RDW: 12.7 % (ref 11.5–15.5)
WBC: 3.6 10*3/uL — ABNORMAL LOW (ref 4.0–10.5)
nRBC: 0 % (ref 0.0–0.2)

## 2021-08-10 LAB — BASIC METABOLIC PANEL WITH GFR
Anion gap: 10 (ref 5–15)
BUN: 42 mg/dL — ABNORMAL HIGH (ref 6–20)
CO2: 27 mmol/L (ref 22–32)
Calcium: 8.4 mg/dL — ABNORMAL LOW (ref 8.9–10.3)
Chloride: 97 mmol/L — ABNORMAL LOW (ref 98–111)
Creatinine, Ser: 4.78 mg/dL — ABNORMAL HIGH (ref 0.61–1.24)
GFR, Estimated: 13 mL/min — ABNORMAL LOW
Glucose, Bld: 99 mg/dL (ref 70–99)
Potassium: 3.6 mmol/L (ref 3.5–5.1)
Sodium: 134 mmol/L — ABNORMAL LOW (ref 135–145)

## 2021-08-10 LAB — GLUCOSE, CAPILLARY
Glucose-Capillary: 81 mg/dL (ref 70–99)
Glucose-Capillary: 223 mg/dL — ABNORMAL HIGH (ref 70–99)
Glucose-Capillary: 104 mg/dL — ABNORMAL HIGH (ref 70–99)

## 2021-08-10 LAB — HEPATITIS B SURFACE ANTIBODY, QUANTITATIVE: Hep B S AB Quant (Post): <3.1 m[IU]/mL — ABNORMAL LOW (ref >9.9)

## 2021-08-10 MED ORDER — HEPARIN SODIUM (PORCINE) 1000 UNIT/ML IJ SOLN
INTRAMUSCULAR | Status: AC
  Filled 2021-08-10: qty 10

## 2021-08-10 MED ORDER — FUROSEMIDE 10 MG/ML IJ SOLN
40.0000 mg | Freq: Two times a day (BID) | INTRAMUSCULAR | Status: DC
Start: 2021-08-10 — End: 2021-08-12
  Administered 2021-08-10 – 2021-08-12 (×5): 40 mg via INTRAVENOUS
  Filled 2021-08-10 (×5): qty 4

## 2021-08-10 NOTE — Progress Notes (Signed)
Progress Note    Jimmy Olson  SWN:[id number] DOB: 1961/04/21  DOA: 08/08/2021 PCP: [facility] [place] [facility]      Brief Narrative:    Medical records reviewed and are as summarized below:  Jimmy Olson is a 61 y.o. male with medical history significant for insulin-dependent type 2 diabetes, chronic systolic and diastolic CHF (EF 45 to 93%, G1DD 07/15/21), ESRD initiated on HD 05/2021, anemia of CKD,  two-vessel CAD on LHC 06/2020, depression, DVT of axillary vein 05/2021 on Eliquis, recently hospitalized from 1/26 to 1/27 with a TIA, with MRA neck showing ulcerated plaque proximal left ICA with 50 to 60% stenosis.  He presented to the hospital because of dry cough, nasal congestion and shortness of breath.  He is vaccinated against influenza but not COVID.     Assessment/Plan:   Principal Problem:   Acute on chronic combined systolic and diastolic CHF (congestive heart failure) (HCC) Active Problems:   Hypertensive emergency   Acute respiratory failure with hypoxia ([place])   Uncontrolled type 2 diabetes mellitus with hyperglycemia, with long-term current use of insulin ([place])   Demand ischemia (HCC)   ESRD on hemodialysis ([place])   NSTEMI (non-ST elevated myocardial infarction) ([place])   History of DVT (deep vein thrombosis)   Chronic anticoagulation   COVID-19 virus infection   Anemia of chronic kidney failure, stage 5 (HCC)    Body mass index is 23.02 kg/m.   Acute on chronic combined systolic and diastolic CHF:  2D echo in January 2023 showed EF estimated at 45 to 81%, grade 1 diastolic dysfunction. Getting HD and iv lasix per nephrology On 2/22 exam, he does not appear volume overloaded   daily weight and urine output.   Elevated troponin suspected to be from demand ischemia: Troponins 983 to 3,577.   S/p  IV heparin infusion for total of 48 hours by cardiologist.   Plan for cardiac cath to be arranged in the outpatient setting.  Follow-up with  cardiologist for further recommendations.    Acute hypoxic respiratory failure: Improved.  He is tolerating room air.  Hypertensive emergency: BP is better.  Continue antihypertensives  ESRD, anemia of chronic disease, secondary hyperparathyroidism: Plan for hemodialysis today   Follow-up with nephrologist  Type II DM with severe hyperglycemia, hypoglycemia on 08/08/2021: Glucose dropped to 60 last night.  Decrease Levemir from 20 to 8 units nightly.  Use NovoLog as needed for hyperglycemia  COVID-19 infection: It is not clear if he has pneumonia from COVID-19 infection.  He reports coughing started on Sunday, Continue IV remdesivir, today is day three of remdesivir, f/u cpr in am, he continue reports feeling weak, may need total of 5 days treatment pending crp level  Left axillary vein DVT diagnosed on 06/02/2021: Eliquis held  initially due to heparin drip , now back on eliquis   Diet Order             Diet heart healthy/carb modified Room service appropriate? Yes; Fluid consistency: Thin  Diet effective now                      Consultants: Nephrologist Cardiologist  Procedures: None    Medications:    albuterol  2 puff Inhalation Q6H   apixaban  5 mg Oral BID   aspirin EC  81 mg Oral Daily   atorvastatin  80 mg Oral Daily   carvedilol  25 mg Oral BID WC   Chlorhexidine Gluconate Cloth  6 each Topical Q0600   furosemide  40 mg Intravenous BID   hydrALAZINE  100 mg Oral Q8H   insulin aspart  0-9 Units Subcutaneous TID WC   insulin detemir  8 Units Subcutaneous QHS   isosorbide mononitrate  30 mg Oral Daily   Continuous Infusions:  sodium chloride     sodium chloride     remdesivir 100 mg in NS 100 mL Stopped (08/09/21 0933)     Anti-infectives (From admission, onward)    Start     Dose/Rate Route Frequency Ordered Stop   08/09/21 1000  remdesivir 100 mg in sodium chloride 0.9 % 100 mL IVPB       See Hyperspace for full Linked Orders Report.   100  mg 200 mL/hr over 30 Minutes Intravenous Daily 08/08/21 0226 08/11/21 0959   08/09/21 1000  remdesivir 100 mg in sodium chloride 0.9 % 100 mL IVPB  Status:  Discontinued       See Hyperspace for full Linked Orders Report.   100 mg 200 mL/hr over 30 Minutes Intravenous Daily 08/08/21 0337 08/08/21 0340   08/08/21 0345  remdesivir 200 mg in sodium chloride 0.9% 250 mL IVPB  Status:  Discontinued       See Hyperspace for full Linked Orders Report.   200 mg 580 mL/hr over 30 Minutes Intravenous Once 08/08/21 9562 08/08/21 0340   08/08/21 0230  remdesivir 200 mg in sodium chloride 0.9% 250 mL IVPB       See Hyperspace for full Linked Orders Report.   200 mg 580 mL/hr over 30 Minutes Intravenous Once 08/08/21 0226 08/08/21 0322   08/08/21 0030  ceFEPIme (MAXIPIME) 2 g in sodium chloride 0.9 % 100 mL IVPB        2 g 200 mL/hr over 30 Minutes Intravenous  Once 08/08/21 0023 08/08/21 0244   08/08/21 0030  metroNIDAZOLE (FLAGYL) IVPB 500 mg        500 mg 100 mL/hr over 60 Minutes Intravenous  Once 08/08/21 0023 08/08/21 0147   08/08/21 0030  vancomycin (VANCOCIN) IVPB 1000 mg/200 mL premix  Status:  Discontinued        1,000 mg 200 mL/hr over 60 Minutes Intravenous  Once 08/08/21 0023 08/08/21 0026   08/08/21 0030  vancomycin (VANCOREADY) IVPB 1500 mg/300 mL        1,500 mg 150 mL/hr over 120 Minutes Intravenous  Once 08/08/21 0026 08/08/21 0346              Family Communication/Anticipated D/C date and plan/Code Status   DVT prophylaxis:  apixaban (ELIQUIS) tablet 5 mg     Code Status: Full Code  Family Communication: None Disposition Plan: remain on iv lasix, getting HD today, d/c in 24-48hrs , f/u on crp level to decide on further dosing of remdesivir, he continue to reports feeling weak, not back to his baseline    Status is: Inpatient Remains inpatient appropriate because:  ? NSTEMI, CHF exacerbation, COVID-19 infection      Subjective:   C/o feeling weak, less  cough, denies chest pain, he is on room air at rest, he reports not ready to go home due to weakness He reports no appetite   Objective:    Vitals:   08/09/21 1936 08/10/21 0007 08/10/21 0412 08/10/21 0815  BP: 137/69 (!) 149/72 (!) 147/79 (!) 169/85  Pulse: 75 80 80 78  Resp: 18 18 19    Temp: 98.3 F (36.8 C) 98.2 F (36.8 C) 98.2 F (36.8  C)   TempSrc: Oral Oral    SpO2: 98% 97% 96% 97%  Weight:      Height:       No data found.   Intake/Output Summary (Last 24 hours) at 08/10/2021 0827 Last data filed at 08/10/2021 0100 Gross per 24 hour  Intake 1808.88 ml  Output 1510 ml  Net 298.88 ml   Filed Weights   08/08/21 1626 08/08/21 1948 08/09/21 0510  Weight: 67.4 kg 66.6 kg 64.7 kg    Exam:  GEN: appear weak, does not appear volume overloaded  SKIN: No rash EYES: EOMI ENT: MMM CV: RRR PULM: CTA B ABD: soft, ND, NT, +BS CNS: AAO x 3, non focal EXT: No edema or tenderness MSK: No spinal tenderness       Data Reviewed:   I have personally reviewed following labs and imaging studies:  Labs: Labs show the following:   Basic Metabolic Panel: Recent Labs  Lab 08/08/21 0004 08/09/21 0547 08/10/21 0618  NA 133* 134* 134*  K 4.6 3.6 3.6  CL 103 97* 97*  CO2 23 26 27   GLUCOSE 504* 136* 99  BUN 35* 25* 42*  CREATININE 3.81* 3.33* 4.78*  CALCIUM 8.8* 8.6* 8.4*   GFR Estimated Creatinine Clearance: 15.2 mL/min (A) (by C-G formula based on SCr of 4.78 mg/dL (H)). Liver Function Tests: Recent Labs  Lab 08/08/21 0004  AST 27  ALT 25  ALKPHOS 131*  BILITOT 1.0  PROT 7.4  ALBUMIN 3.8   No results for input(s): LIPASE, AMYLASE in the last 168 hours. No results for input(s): AMMONIA in the last 168 hours. Coagulation profile Recent Labs  Lab 08/08/21 0004  INR 1.2    CBC: Recent Labs  Lab 08/08/21 0004 08/09/21 0547 08/10/21 0618  WBC 7.3 4.6 3.6*  NEUTROABS 6.0  --   --   HGB 9.4* 10.0* 10.0*  HCT 28.9* 29.2* 30.1*  MCV 89.2 86.6 86.5   PLT 223 190 179   Cardiac Enzymes: No results for input(s): CKTOTAL, CKMB, CKMBINDEX, TROPONINI in the last 168 hours. BNP (last 3 results) No results for input(s): PROBNP in the last 8760 hours. CBG: Recent Labs  Lab 08/09/21 0815 08/09/21 1143 08/09/21 1654 08/09/21 2114 08/10/21 0817  GLUCAP 149* 216* 281* 165* 81   D-Dimer: No results for input(s): DDIMER in the last 72 hours. Hgb A1c: No results for input(s): HGBA1C in the last 72 hours. Lipid Profile: Recent Labs    08/08/21 0424  CHOL 90  HDL 35*  LDLCALC 46  TRIG 47  CHOLHDL 2.6   Thyroid function studies: No results for input(s): TSH, T4TOTAL, T3FREE, THYROIDAB in the last 72 hours.  Invalid input(s): FREET3 Anemia work up: No results for input(s): VITAMINB12, FOLATE, FERRITIN, TIBC, IRON, RETICCTPCT in the last 72 hours. Sepsis Labs: Recent Labs  Lab 08/08/21 0004 08/08/21 0013 08/08/21 0115 08/09/21 0547 08/10/21 0618  PROCALCITON  --  0.15  --   --   --   WBC 7.3  --   --  4.6 3.6*  LATICACIDVEN  --   --  0.7  --   --     Microbiology Recent Results (from the past 240 hour(s))  Culture, blood (Routine x 2)     Status: None (Preliminary result)   Collection Time: 08/08/21 12:04 AM   Specimen: Left Antecubital; Blood  Result Value Ref Range Status   Specimen Description LEFT ANTECUBITAL  Final   Special Requests   Final    BOTTLES  DRAWN AEROBIC AND ANAEROBIC Blood Culture results may not be optimal due to an excessive volume of blood received in culture bottles   Culture   Final    NO GROWTH 2 DAYS Performed at [facility], [address] [place], [place] [address]    Report Status PENDING  Incomplete  Resp Panel by RT-PCR (Flu A&B, Covid) Nasopharyngeal Swab     Status: Abnormal   Collection Time: 08/08/21 12:13 AM   Specimen: Nasopharyngeal Swab; Nasopharyngeal(NP) swabs in vial transport medium  Result Value Ref Range Status   SARS Coronavirus 2 by RT PCR POSITIVE (A)  NEGATIVE Final    Comment: (NOTE) SARS-CoV-2 target nucleic acids are DETECTED.  The SARS-CoV-2 RNA is generally detectable in upper respiratory specimens during the acute phase of infection. Positive results are indicative of the presence of the identified virus, but do not rule out bacterial infection or co-infection with other pathogens not detected by the test. Clinical correlation with patient history and other diagnostic information is necessary to determine patient infection status. The expected result is Negative.  Fact Sheet for Patients: EntrepreneurPulse.com.au  Fact Sheet for Healthcare Providers: IncredibleEmployment.be  This test is not yet approved or cleared by the [place] FDA and  has been authorized for detection and/or diagnosis of SARS-CoV-2 by FDA under an Emergency Use Authorization (EUA).  This EUA will remain in effect (meaning this test can be used) for the duration of  the COVID-19 declaration under Section 564(b)(1) of the A ct, 21 U.S.C. section 360bbb-3(b)(1), unless the authorization is terminated or revoked sooner.     Influenza A by PCR NEGATIVE NEGATIVE Final   Influenza B by PCR NEGATIVE NEGATIVE Final    Comment: (NOTE) The Xpert Xpress SARS-CoV-2/FLU/RSV plus assay is intended as an aid in the diagnosis of influenza from Nasopharyngeal swab specimens and should not be used as a sole basis for treatment. Nasal washings and aspirates are unacceptable for Xpert Xpress SARS-CoV-2/FLU/RSV testing.  Fact Sheet for Patients: EntrepreneurPulse.com.au  Fact Sheet for Healthcare Providers: IncredibleEmployment.be  This test is not yet approved or cleared by the [place] FDA and has been authorized for detection and/or diagnosis of SARS-CoV-2 by FDA under an Emergency Use Authorization (EUA). This EUA will remain in effect (meaning this test can be used) for the  duration of the COVID-19 declaration under Section 564(b)(1) of the Act, 21 U.S.C. section 360bbb-3(b)(1), unless the authorization is terminated or revoked.  Performed at [facility], [address] [place], [place] [address]   Culture, blood (Routine x 2)     Status: None (Preliminary result)   Collection Time: 08/08/21  1:50 AM   Specimen: BLOOD  Result Value Ref Range Status   Specimen Description BLOOD LEFT  Final   Special Requests   Final    BOTTLES DRAWN AEROBIC AND ANAEROBIC Blood Culture adequate volume   Culture   Final    NO GROWTH 2 DAYS Performed at [facility], [address] [place], [place] [address]    Report Status PENDING  Incomplete    Procedures and diagnostic studies:  ECHOCARDIOGRAM LIMITED  Result Date: 08/08/2021    ECHOCARDIOGRAM LIMITED REPORT   Patient Name:   Jimmy Olson Date of Exam: 08/08/2021 Medical Rec #:  [id number]     Height:       66.0 in Accession #:    [id number]    Weight:       150.4 lb Date of Birth:  1961-05-25  BSA:          1.771 m Patient Age:    61 years      BP:           152/87 mmHg Patient Gender: M             HR:           84 bpm. Exam Location:  [facility] Procedure: Limited Echo, Limited Color Doppler, Cardiac Doppler and Strain            Analysis Indications:     I21.4 NSTEMI  History:         Patient has prior history of Echocardiogram examinations, most                  recent 07/15/2021. HFrEF, CKD, stage IV; Risk                  Factors:Hypertension and Diabetes. Pt tested positive for                  COVID-19 on 08/08/21.  Sonographer:     Charmayne Sheer Referring Phys:  [id number] Athena Masse Diagnosing Phys: [name] MD  Sonographer Comments: Global longitudinal strain was attempted. IMPRESSIONS  1. Left ventricular ejection fraction, by estimation, is 40 to 45%. The left ventricle has mildly decreased function. The left ventricle demonstrates global hypokinesis. The left ventricular internal cavity size  was mildly dilated. There is mild left ventricular hypertrophy. Left ventricular diastolic function could not be evaluated. The average left ventricular global longitudinal strain is -8.6 %. The global longitudinal strain is abnormal.  2. Right ventricular systolic function is normal. The right ventricular size is normal. Tricuspid regurgitation signal is inadequate for assessing PA pressure.  3. Left atrial size was mildly dilated.  4. The mitral valve is normal in structure. No evidence of mitral valve regurgitation. No evidence of mitral stenosis.  5. The aortic valve is normal in structure. Aortic valve regurgitation is not visualized. Aortic valve sclerosis is present, with no evidence of aortic valve stenosis.  6. The inferior vena cava is normal in size with greater than 50% respiratory variability, suggesting right atrial pressure of 3 mmHg. FINDINGS  Left Ventricle: Left ventricular ejection fraction, by estimation, is 40 to 45%. The left ventricle has mildly decreased function. The left ventricle demonstrates global hypokinesis. The average left ventricular global longitudinal strain is -8.6 %. The  global longitudinal strain is abnormal. The left ventricular internal cavity size was mildly dilated. There is mild left ventricular hypertrophy. Left ventricular diastolic function could not be evaluated. Right Ventricle: The right ventricular size is normal. No increase in right ventricular wall thickness. Right ventricular systolic function is normal. Tricuspid regurgitation signal is inadequate for assessing PA pressure. Left Atrium: Left atrial size was mildly dilated. Right Atrium: Right atrial size was normal in size. Pericardium: There is no evidence of pericardial effusion. Mitral Valve: The mitral valve is normal in structure. Mild mitral annular calcification. No evidence of mitral valve stenosis. Tricuspid Valve: The tricuspid valve is normal in structure. Tricuspid valve regurgitation is not  demonstrated. No evidence of tricuspid stenosis. Aortic Valve: The aortic valve is normal in structure. Aortic valve regurgitation is not visualized. Aortic valve sclerosis is present, with no evidence of aortic valve stenosis. Pulmonic Valve: The pulmonic valve was normal in structure. Pulmonic valve regurgitation is not visualized. No evidence of pulmonic stenosis. Aorta: The aortic root is normal in size and structure. Venous: The  inferior vena cava is normal in size with greater than 50% respiratory variability, suggesting right atrial pressure of 3 mmHg. IAS/Shunts: No atrial level shunt detected by color flow Doppler. LEFT VENTRICLE PLAX 2D LVIDd:         5.26 cm LVIDs:         4.13 cm 2D Longitudinal Strain LV PW:         1.38 cm 2D Strain GLS Avg:     -8.6 % LV IVS:        1.05 cm  LEFT ATRIUM         Index LA diam:    4.40 cm 2.48 cm/m [name] MD Electronically signed by [name] MD Signature Date/Time: 08/08/2021/10:23:08 AM    Final                LOS: 2 days   Florencia Reasons MD PhD FACP [facility]   Pager on [email or web address]. If 7PM-7AM, please contact night-coverage at [email or web address]     08/10/2021, 8:27 AM

## 2021-08-10 NOTE — TOC Progression Note (Signed)
Transition of Care [facility]) - Progression Note    Patient Details  Name: Jimmy Olson MRN: [id number] Date of Birth: 09-Jan-1961  Transition of [facility]) CM/SW [place], LCSW Phone Number: 08/10/2021, 9:16 AM  Clinical Narrative:   CSW acknowledges consult for heart failure screen. Sent secure chat to heart failure nurse navigator to notify.  Expected Discharge Plan: [place] Barriers to Discharge: Continued Medical Work up  Expected Discharge Plan and Services Expected Discharge Plan: [place]                                               Social Determinants of Health (SDOH) Interventions    Readmission Risk Interventions Readmission Risk Prevention Plan 08/08/2021 06/01/2021  Transportation Screening Complete Complete  PCP or Specialist Appt within 3-5 Days Complete Complete  HRI or Home Care Consult Complete Complete  Social Work Consult for [place] Planning/Counseling Complete Complete  Palliative Care Screening Not Applicable Not Applicable  Medication Review Press photographer) Complete Referral to Pharmacy  Some recent data might be hidden

## 2021-08-10 NOTE — Plan of Care (Signed)
  Problem: Health Behavior/Discharge Planning: Goal: Ability to manage health-related needs will improve Outcome: Progressing   Problem: Clinical Measurements: Goal: Ability to maintain clinical measurements within normal limits will improve Outcome: Progressing   

## 2021-08-10 NOTE — Progress Notes (Signed)
[facility]  ROUNDING NOTE   Subjective:   Jimmy Olson is a 61 year old Hispanic male with past medical conditions including anemia, two-vessel CAD, diabetes, depression, DVT with Eliquis, and end-stage renal disease on hemodialysis.  Patient presents to the emergency department with complaints of shortness of breath, cough and nasal congestion.  Patient was admitted for Hyperglycemia [R73.9] NSTEMI (non-ST elevated myocardial infarction) ([place]) [I21.4] Acute respiratory failure with hypoxia (HCC) [J96.01] Acute on chronic congestive heart failure, unspecified heart failure type ([place]) [I50.9] Acute sepsis ([place]) [A41.9] COVID-19 [U07.1]  Patient is known to our practice and receives outpatient dialysis treatments at [facility] on a MWF schedule, supervised by Dr. Candiss Norse.    Patient resting quietly Alert and oriented Poor appetite but improving  Scheduled for dialysis later today   Objective:  Vital signs in last 24 hours:  Temp:  [98.2 F (36.8 C)-98.9 F (37.2 C)] 98.9 F (37.2 C) (02/22 1141) Pulse Rate:  [72-80] 72 (02/22 1141) Resp:  [18-20] 19 (02/22 0412) BP: (129-169)/(66-85) 129/77 (02/22 1141) SpO2:  [96 %-99 %] 97 % (02/22 1141) Weight:  [63.1 kg] 63.1 kg (02/22 1209)  Weight change:  Filed Weights   08/08/21 1948 08/09/21 0510 08/10/21 1209  Weight: 66.6 kg 64.7 kg 63.1 kg    Intake/Output: I/O last 3 completed shifts: In: 2138 [P.O.:1760; I.V.:283.3; IV Piggyback:94.7] Out: 3033 [Urine:2030; Other:1003]   Intake/Output this shift:  Total I/O In: 100 [IV Piggyback:100] Out: 350 [Urine:350]  Physical Exam: General: NAD  Head: Normocephalic, atraumatic. Moist oral mucosal membranes  Eyes: Anicteric  Lungs:  Clear bilaterally, normal effort  Heart: Regular rate and rhythm  Abdomen:  Soft, nontender  Extremities: Trace peripheral edema.  Neurologic: Nonfocal, moving all four extremities  Skin: No lesions  Access: Right IJ  PermCath    Basic Metabolic Panel: Recent Labs  Lab 08/08/21 0004 08/09/21 0547 08/10/21 0618  NA 133* 134* 134*  K 4.6 3.6 3.6  CL 103 97* 97*  CO2 23 26 27   GLUCOSE 504* 136* 99  BUN 35* 25* 42*  CREATININE 3.81* 3.33* 4.78*  CALCIUM 8.8* 8.6* 8.4*     Liver Function Tests: Recent Labs  Lab 08/08/21 0004  AST 27  ALT 25  ALKPHOS 131*  BILITOT 1.0  PROT 7.4  ALBUMIN 3.8    No results for input(s): LIPASE, AMYLASE in the last 168 hours. No results for input(s): AMMONIA in the last 168 hours.  CBC: Recent Labs  Lab 08/08/21 0004 08/09/21 0547 08/10/21 0618  WBC 7.3 4.6 3.6*  NEUTROABS 6.0  --   --   HGB 9.4* 10.0* 10.0*  HCT 28.9* 29.2* 30.1*  MCV 89.2 86.6 86.5  PLT 223 190 179     Cardiac Enzymes: No results for input(s): CKTOTAL, CKMB, CKMBINDEX, TROPONINI in the last 168 hours.  BNP: Invalid input(s): POCBNP  CBG: Recent Labs  Lab 08/09/21 1143 08/09/21 1654 08/09/21 2114 08/10/21 0817 08/10/21 1141  GLUCAP 216* 281* 165* 81 223*     Microbiology: Results for orders placed or performed during the hospital encounter of 08/08/21  Culture, blood (Routine x 2)     Status: None (Preliminary result)   Collection Time: 08/08/21 12:04 AM   Specimen: Left Antecubital; Blood  Result Value Ref Range Status   Specimen Description LEFT ANTECUBITAL  Final   Special Requests   Final    BOTTLES DRAWN AEROBIC AND ANAEROBIC Blood Culture results may not be optimal due to an excessive volume of  blood received in culture bottles   Culture   Final    NO GROWTH 2 DAYS Performed at [facility], [address] [place], [place] [address]    Report Status PENDING  Incomplete  Resp Panel by RT-PCR (Flu A&B, Covid) Nasopharyngeal Swab     Status: Abnormal   Collection Time: 08/08/21 12:13 AM   Specimen: Nasopharyngeal Swab; Nasopharyngeal(NP) swabs in vial transport medium  Result Value Ref Range Status   SARS Coronavirus 2 by RT PCR POSITIVE  (A) NEGATIVE Final    Comment: (NOTE) SARS-CoV-2 target nucleic acids are DETECTED.  The SARS-CoV-2 RNA is generally detectable in upper respiratory specimens during the acute phase of infection. Positive results are indicative of the presence of the identified virus, but do not rule out bacterial infection or co-infection with other pathogens not detected by the test. Clinical correlation with patient history and other diagnostic information is necessary to determine patient infection status. The expected result is Negative.  Fact Sheet for Patients: EntrepreneurPulse.com.au  Fact Sheet for Healthcare Providers: IncredibleEmployment.be  This test is not yet approved or cleared by the [place] FDA and  has been authorized for detection and/or diagnosis of SARS-CoV-2 by FDA under an Emergency Use Authorization (EUA).  This EUA will remain in effect (meaning this test can be used) for the duration of  the COVID-19 declaration under Section 564(b)(1) of the A ct, 21 U.S.C. section 360bbb-3(b)(1), unless the authorization is terminated or revoked sooner.     Influenza A by PCR NEGATIVE NEGATIVE Final   Influenza B by PCR NEGATIVE NEGATIVE Final    Comment: (NOTE) The Xpert Xpress SARS-CoV-2/FLU/RSV plus assay is intended as an aid in the diagnosis of influenza from Nasopharyngeal swab specimens and should not be used as a sole basis for treatment. Nasal washings and aspirates are unacceptable for Xpert Xpress SARS-CoV-2/FLU/RSV testing.  Fact Sheet for Patients: EntrepreneurPulse.com.au  Fact Sheet for Healthcare Providers: IncredibleEmployment.be  This test is not yet approved or cleared by the [place] FDA and has been authorized for detection and/or diagnosis of SARS-CoV-2 by FDA under an Emergency Use Authorization (EUA). This EUA will remain in effect (meaning this test can be used) for the  duration of the COVID-19 declaration under Section 564(b)(1) of the Act, 21 U.S.C. section 360bbb-3(b)(1), unless the authorization is terminated or revoked.  Performed at [facility], [address] [place], [place] [address]   Culture, blood (Routine x 2)     Status: None (Preliminary result)   Collection Time: 08/08/21  1:50 AM   Specimen: BLOOD  Result Value Ref Range Status   Specimen Description BLOOD LEFT  Final   Special Requests   Final    BOTTLES DRAWN AEROBIC AND ANAEROBIC Blood Culture adequate volume   Culture   Final    NO GROWTH 2 DAYS Performed at [facility], [address] [place] [address]    Report Status PENDING  Incomplete    Coagulation Studies: Recent Labs    08/08/21 0004  LABPROT 15.4*  INR 1.2     Urinalysis: No results for input(s): COLORURINE, LABSPEC, PHURINE, GLUCOSEU, HGBUR, BILIRUBINUR, KETONESUR, PROTEINUR, UROBILINOGEN, NITRITE, LEUKOCYTESUR in the last 72 hours.  Invalid input(s): APPERANCEUR    Imaging: No results found.   Medications:    sodium chloride     sodium chloride      albuterol  2 puff Inhalation Q6H   apixaban  5 mg Oral BID   aspirin EC  81 mg Oral Daily   atorvastatin  80 mg Oral Daily   carvedilol  25 mg Oral BID WC   Chlorhexidine Gluconate Cloth  6 each Topical Q0600   furosemide  40 mg Intravenous BID   hydrALAZINE  100 mg Oral Q8H   insulin aspart  0-9 Units Subcutaneous TID WC   insulin detemir  8 Units Subcutaneous QHS   isosorbide mononitrate  30 mg Oral Daily   sodium chloride, sodium chloride, acetaminophen, alteplase, chlorpheniramine-HYDROcodone, guaiFENesin-dextromethorphan, heparin, lidocaine (PF), lidocaine-prilocaine, nitroGLYCERIN, ondansetron (ZOFRAN) IV, oxyCODONE, pentafluoroprop-tetrafluoroeth  Assessment/ Plan:  Mr. MASASHI SNOWDON is a 61 y.o.  male with past medical conditions including anemia, two-vessel CAD, diabetes, depression, DVT with Eliquis,  and end-stage renal disease on hemodialysis.  Patient presents to the emergency department with complaints of shortness of breath, cough and nasal congestion.  Patient was admitted for Hyperglycemia [R73.9] NSTEMI (non-ST elevated myocardial infarction) ([place]) [I21.4] Acute respiratory failure with hypoxia (HCC) [J96.01] Acute on chronic congestive heart failure, unspecified heart failure type ([place]) [I50.9] Acute sepsis ([place]) [A41.9] COVID-19 [U07.1]  [facility]/MWF/right IJ PermCath  End-stage renal disease on hemodialysis.  Will maintain outpatient schedule if possible.  Scheduled to receive dialysis later today on COVID shift.  Dialysis coordinator aware of COVID status and making necessary outpatient arrangements.  2. Anemia of chronic kidney disease  Normocytic Lab Results  Component Value Date   HGB 10.0 (L) 08/10/2021  Mircera received outpatient We will continue to monitor hemoglobin  3. Secondary Hyperparathyroidism:  Lab Results  Component Value Date   CALCIUM 8.4 (L) 08/10/2021   PHOS 5.3 (H) 06/05/2021  Calcium and phosphorus within acceptable range   4. 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 Home regimen includes Humalog and Levemir. Most recent hemoglobin A1c is 10.1 on 07/14/21.  Glucose slightly elevated.   5.  Hypertension with chronic kidney disease.  Home regimen includes furosemide, carvedilol, hydralazine, and isosorbide.  Currently receiving these medications.  Blood pressure stable for this patient    LOS: 2 Colon Flattery, NP 2/22/20233:32 PM

## 2021-08-10 NOTE — Progress Notes (Signed)
Progress Note  Patient Name: Jimmy Olson Date of Encounter: 08/10/2021  [facility] Cardiologist: Ida Rogue, MD   Subjective   Denies chest pain, denies shortness of breath.  No acute events overnight.  Inpatient Medications    Scheduled Meds:  albuterol  2 puff Inhalation Q6H   apixaban  5 mg Oral BID   aspirin EC  81 mg Oral Daily   atorvastatin  80 mg Oral Daily   carvedilol  25 mg Oral BID WC   Chlorhexidine Gluconate Cloth  6 each Topical Q0600   furosemide  40 mg Intravenous BID   hydrALAZINE  100 mg Oral Q8H   insulin aspart  0-9 Units Subcutaneous TID WC   insulin detemir  8 Units Subcutaneous QHS   isosorbide mononitrate  30 mg Oral Daily   Continuous Infusions:  sodium chloride     sodium chloride     PRN Meds: sodium chloride, sodium chloride, acetaminophen, alteplase, chlorpheniramine-HYDROcodone, guaiFENesin-dextromethorphan, heparin, lidocaine (PF), lidocaine-prilocaine, nitroGLYCERIN, ondansetron (ZOFRAN) IV, oxyCODONE, pentafluoroprop-tetrafluoroeth   Vital Signs    Vitals:   08/10/21 0412 08/10/21 0815 08/10/21 1141 08/10/21 1209  BP: (!) 147/79 (!) 169/85 129/77   Pulse: 80 78 72   Resp: 19     Temp: 98.2 F (36.8 C)  98.9 F (37.2 C)   TempSrc:      SpO2: 96% 97% 97%   Weight:    63.1 kg  Height:        Intake/Output Summary (Last 24 hours) at 08/10/2021 1236 Last data filed at 08/10/2021 1100 Gross per 24 hour  Intake 1334.19 ml  Output 1410 ml  Net -75.81 ml   Last 3 Weights 08/10/2021 08/09/2021 08/08/2021  Weight (lbs) 139 lb 3.2 oz 142 lb 10.2 oz 146 lb 13.2 oz  Weight (kg) 63.141 kg 64.7 kg 66.6 kg      Telemetry    Sinus rhythm- Personally Reviewed  ECG     - Personally Reviewed  Physical Exam   GEN: No acute distress.   Neck: No JVD Cardiac: RRR, no murmurs, rubs, or gallops.  Respiratory: Clear to auscultation bilaterally. GI: Soft, nontender, non-distended  MS: No edema; No deformity. Neuro:  Nonfocal   Psych: Normal affect   Labs    High Sensitivity Troponin:   Recent Labs  Lab 08/08/21 0023 08/08/21 0227  TROPONINIHS 983* 3,577*     Chemistry Recent Labs  Lab 08/08/21 0004 08/09/21 0547 08/10/21 0618  NA 133* 134* 134*  K 4.6 3.6 3.6  CL 103 97* 97*  CO2 23 26 27   GLUCOSE 504* 136* 99  BUN 35* 25* 42*  CREATININE 3.81* 3.33* 4.78*  CALCIUM 8.8* 8.6* 8.4*  PROT 7.4  --   --   ALBUMIN 3.8  --   --   AST 27  --   --   ALT 25  --   --   ALKPHOS 131*  --   --   BILITOT 1.0  --   --   GFRNONAA 18* 21* 13*  ANIONGAP 7 11 10     Lipids  Recent Labs  Lab 08/08/21 0424  CHOL 90  TRIG 47  HDL 35*  LDLCALC 46  CHOLHDL 2.6    Hematology Recent Labs  Lab 08/08/21 0004 08/09/21 0547 08/10/21 0618  WBC 7.3 4.6 3.6*  RBC 3.24* 3.37* 3.48*  HGB 9.4* 10.0* 10.0*  HCT 28.9* 29.2* 30.1*  MCV 89.2 86.6 86.5  MCH 29.0 29.7 28.7  MCHC 32.5 34.2  33.2  RDW 12.5 12.7 12.7  PLT 223 190 179   Thyroid No results for input(s): TSH, FREET4 in the last 168 hours.  BNP Recent Labs  Lab 08/08/21 0023  BNP 2,160.8*    DDimer No results for input(s): DDIMER in the last 168 hours.   Radiology    No results found.  Cardiac Studies   TTE 07/2021 1. Left ventricular ejection fraction, by estimation, is 40 to 45%. The  left ventricle has mildly decreased function. The left ventricle  demonstrates global hypokinesis. The left ventricular internal cavity size  was mildly dilated. There is mild left  ventricular hypertrophy. Left ventricular diastolic function could not be  evaluated. The average left ventricular global longitudinal strain is -8.6  %. The global longitudinal strain is abnormal.   2. Right ventricular systolic function is normal. The right ventricular  size is normal. Tricuspid regurgitation signal is inadequate for assessing  PA pressure.   3. Left atrial size was mildly dilated.   4. The mitral valve is normal in structure. No evidence of mitral valve   regurgitation. No evidence of mitral stenosis.   5. The aortic valve is normal in structure. Aortic valve regurgitation is  not visualized. Aortic valve sclerosis is present, with no evidence of  aortic valve stenosis.   6. The inferior vena cava is normal in size with greater than 50%  respiratory variability, suggesting right atrial pressure of 3 mmHg.   Patient Profile     61 y.o. male with history of two-vessel CAD, end-stage renal disease on HD, HFrEF EF 40 to 45%, DVT on Eliquis, TIA admitted with COVID-19 infection, being seen for elevated troponins.  Assessment & Plan    Two-vessel CAD (LAD, RCA), elevated troponins/NSTEMI -S/p heparin x48 hours -Aspirin, Lipitor, Coreg, Imdur -Plan left heart cath as outpatient after acute illness, will likely need arthrectomy. -No additional cardiac testing or intervention of -Restart Eliquis for DVT history  2.  Cardiomyopathy EF 40 to 45% -Appears euvolemic -Continue Coreg, hydralazine, Imdur.  3.  COVID-19 infection -Management as per medicine team.  4.  ESRD on HD -Dialysis as per nephrology  No additional cardiac testing or intervention planned this admission.  Continue medications as above.  Management of COVID-19 as per medicine team.  Follow-up as outpatient for left heart cath planning.  Please let us know if additional input is needed.  Cardiology will sign off.  Total encounter time more than 50 minutes  Greater than 50% was spent in counseling and coordination of care with the patient      Signed, Kate Sable, MD  08/10/2021, 12:36 PM

## 2021-08-11 DIAGNOSIS — I5043 Acute on chronic combined systolic (congestive) and diastolic (congestive) heart failure: Secondary | ICD-10-CM | POA: Diagnosis not present

## 2021-08-11 LAB — GLUCOSE, CAPILLARY
Glucose-Capillary: 103 mg/dL — ABNORMAL HIGH (ref 70–99)
Glucose-Capillary: 142 mg/dL — ABNORMAL HIGH (ref 70–99)
Glucose-Capillary: 216 mg/dL — ABNORMAL HIGH (ref 70–99)
Glucose-Capillary: 253 mg/dL — ABNORMAL HIGH (ref 70–99)
Glucose-Capillary: 261 mg/dL — ABNORMAL HIGH (ref 70–99)
Glucose-Capillary: 62 mg/dL — ABNORMAL LOW (ref 70–99)

## 2021-08-11 LAB — BASIC METABOLIC PANEL
Anion gap: 9 (ref 5–15)
BUN: 29 mg/dL — ABNORMAL HIGH (ref 6–20)
CO2: 29 mmol/L (ref 22–32)
Calcium: 8.4 mg/dL — ABNORMAL LOW (ref 8.9–10.3)
Chloride: 97 mmol/L — ABNORMAL LOW (ref 98–111)
Creatinine, Ser: 3.53 mg/dL — ABNORMAL HIGH (ref 0.61–1.24)
GFR, Estimated: 19 mL/min — ABNORMAL LOW (ref >60)
Glucose, Bld: 131 mg/dL — ABNORMAL HIGH (ref 70–99)
Potassium: 3.6 mmol/L (ref 3.5–5.1)
Sodium: 135 mmol/L (ref 135–145)

## 2021-08-11 LAB — CBC
HCT: 30.3 % — ABNORMAL LOW (ref 39.0–52.0)
Hemoglobin: 10.2 g/dL — ABNORMAL LOW (ref 13.0–17.0)
MCH: 29.1 pg (ref 26.0–34.0)
MCHC: 33.7 g/dL (ref 30.0–36.0)
MCV: 86.3 fL (ref 80.0–100.0)
Platelets: 183 10*3/uL (ref 150–400)
RBC: 3.51 MIL/uL — ABNORMAL LOW (ref 4.22–5.81)
RDW: 12.7 % (ref 11.5–15.5)
WBC: 2.9 10*3/uL — ABNORMAL LOW (ref 4.0–10.5)
nRBC: 0 % (ref 0.0–0.2)

## 2021-08-11 LAB — C-REACTIVE PROTEIN: CRP: 2.9 mg/dL — ABNORMAL HIGH (ref <1.0)

## 2021-08-11 NOTE — Plan of Care (Signed)
  Problem: Health Behavior/Discharge Planning: Goal: Ability to manage health-related needs will improve Outcome: Progressing   Problem: Clinical Measurements: Goal: Ability to maintain clinical measurements within normal limits will improve Outcome: Progressing   

## 2021-08-11 NOTE — Progress Notes (Signed)
[facility]  ROUNDING NOTE   Subjective:   Jimmy Olson is a 61 year old Hispanic male with past medical conditions including anemia, two-vessel CAD, diabetes, depression, DVT with Eliquis, and end-stage renal disease on hemodialysis.  Patient presents to the emergency department with complaints of shortness of breath, cough and nasal congestion.  Patient was admitted for Hyperglycemia [R73.9] NSTEMI (non-ST elevated myocardial infarction) ([place]) [I21.4] Acute respiratory failure with hypoxia (HCC) [J96.01] Acute on chronic congestive heart failure, unspecified heart failure type ([place]) [I50.9] Acute sepsis ([place]) [A41.9] COVID-19 [U07.1]  Patient is known to our practice and receives outpatient dialysis treatments at [facility] on a MWF schedule, supervised by Dr. Candiss Norse.    Patient seen resting in bed, family at bedside Alert and oriented Denies pain and discomfort  Dialysis received yesterday   Objective:  Vital signs in last 24 hours:  Temp:  [98 F (36.7 C)-98.7 F (37.1 C)] 98.7 F (37.1 C) (02/23 1121) Pulse Rate:  [69-84] 78 (02/23 1121) Resp:  [0-19] 19 (02/23 0500) BP: (91-163)/(59-85) 91/59 (02/23 1121) SpO2:  [96 %-100 %] 99 % (02/23 1121) Weight:  [62.5 kg-63.9 kg] 62.9 kg (02/23 0546)  Weight change:  Filed Weights   08/10/21 2014 08/10/21 2318 08/11/21 0546  Weight: 63.9 kg 62.5 kg 62.9 kg    Intake/Output: I/O last 3 completed shifts: In: 731.1 [P.O.:560; I.V.:71.1; IV Piggyback:100] Out: 9826 [Urine:1710; Other:1034]   Intake/Output this shift:  Total I/O In: -  Out: 500 [Urine:500]  Physical Exam: General: NAD  Head: Normocephalic, atraumatic. Moist oral mucosal membranes  Eyes: Anicteric  Lungs:  Clear bilaterally, normal effort  Heart: Regular rate and rhythm  Abdomen:  Soft, nontender  Extremities: Trace peripheral edema.  Neurologic: Nonfocal, moving all four extremities  Skin: No lesions  Access: Right IJ  PermCath    Basic Metabolic Panel: Recent Labs  Lab 08/08/21 0004 08/09/21 0547 08/10/21 0618 08/11/21 0619  NA 133* 134* 134* 135  K 4.6 3.6 3.6 3.6  CL 103 97* 97* 97*  CO2 23 26 27 29   GLUCOSE 504* 136* 99 131*  BUN 35* 25* 42* 29*  CREATININE 3.81* 3.33* 4.78* 3.53*  CALCIUM 8.8* 8.6* 8.4* 8.4*     Liver Function Tests: Recent Labs  Lab 08/08/21 0004  AST 27  ALT 25  ALKPHOS 131*  BILITOT 1.0  PROT 7.4  ALBUMIN 3.8    No results for input(s): LIPASE, AMYLASE in the last 168 hours. No results for input(s): AMMONIA in the last 168 hours.  CBC: Recent Labs  Lab 08/08/21 0004 08/09/21 0547 08/10/21 0618 08/11/21 0619  WBC 7.3 4.6 3.6* 2.9*  NEUTROABS 6.0  --   --   --   HGB 9.4* 10.0* 10.0* 10.2*  HCT 28.9* 29.2* 30.1* 30.3*  MCV 89.2 86.6 86.5 86.3  PLT 223 190 179 183     Cardiac Enzymes: No results for input(s): CKTOTAL, CKMB, CKMBINDEX, TROPONINI in the last 168 hours.  BNP: Invalid input(s): POCBNP  CBG: Recent Labs  Lab 08/10/21 1141 08/10/21 1556 08/11/21 0000 08/11/21 0810 08/11/21 1123  GLUCAP 223* 104* 142* 103* 28*     Microbiology: Results for orders placed or performed during the hospital encounter of 08/08/21  Culture, blood (Routine x 2)     Status: None (Preliminary result)   Collection Time: 08/08/21 12:04 AM   Specimen: Left Antecubital; Blood  Result Value Ref Range Status   Specimen Description LEFT ANTECUBITAL  Final   Special Requests  Final    BOTTLES DRAWN AEROBIC AND ANAEROBIC Blood Culture results may not be optimal due to an excessive volume of blood received in culture bottles   Culture   Final    NO GROWTH 3 DAYS Performed at [facility], [address] [place], [place] [address]    Report Status PENDING  Incomplete  Resp Panel by RT-PCR (Flu A&B, Covid) Nasopharyngeal Swab     Status: Abnormal   Collection Time: 08/08/21 12:13 AM   Specimen: Nasopharyngeal Swab; Nasopharyngeal(NP) swabs  in vial transport medium  Result Value Ref Range Status   SARS Coronavirus 2 by RT PCR POSITIVE (A) NEGATIVE Final    Comment: (NOTE) SARS-CoV-2 target nucleic acids are DETECTED.  The SARS-CoV-2 RNA is generally detectable in upper respiratory specimens during the acute phase of infection. Positive results are indicative of the presence of the identified virus, but do not rule out bacterial infection or co-infection with other pathogens not detected by the test. Clinical correlation with patient history and other diagnostic information is necessary to determine patient infection status. The expected result is Negative.  Fact Sheet for Patients: EntrepreneurPulse.com.au  Fact Sheet for Healthcare Providers: IncredibleEmployment.be  This test is not yet approved or cleared by the [place] FDA and  has been authorized for detection and/or diagnosis of SARS-CoV-2 by FDA under an Emergency Use Authorization (EUA).  This EUA will remain in effect (meaning this test can be used) for the duration of  the COVID-19 declaration under Section 564(b)(1) of the A ct, 21 U.S.C. section 360bbb-3(b)(1), unless the authorization is terminated or revoked sooner.     Influenza A by PCR NEGATIVE NEGATIVE Final   Influenza B by PCR NEGATIVE NEGATIVE Final    Comment: (NOTE) The Xpert Xpress SARS-CoV-2/FLU/RSV plus assay is intended as an aid in the diagnosis of influenza from Nasopharyngeal swab specimens and should not be used as a Olson basis for treatment. Nasal washings and aspirates are unacceptable for Xpert Xpress SARS-CoV-2/FLU/RSV testing.  Fact Sheet for Patients: EntrepreneurPulse.com.au  Fact Sheet for Healthcare Providers: IncredibleEmployment.be  This test is not yet approved or cleared by the [place] FDA and has been authorized for detection and/or diagnosis of SARS-CoV-2 by FDA under an Emergency  Use Authorization (EUA). This EUA will remain in effect (meaning this test can be used) for the duration of the COVID-19 declaration under Section 564(b)(1) of the Act, 21 U.S.C. section 360bbb-3(b)(1), unless the authorization is terminated or revoked.  Performed at [facility], [address] [place], [place] [address]   Culture, blood (Routine x 2)     Status: None (Preliminary result)   Collection Time: 08/08/21  1:50 AM   Specimen: BLOOD  Result Value Ref Range Status   Specimen Description BLOOD LEFT  Final   Special Requests   Final    BOTTLES DRAWN AEROBIC AND ANAEROBIC Blood Culture adequate volume   Culture   Final    NO GROWTH 3 DAYS Performed at [facility], [address] [place], [place] [address]    Report Status PENDING  Incomplete    Coagulation Studies: No results for input(s): LABPROT, INR in the last 72 hours.   Urinalysis: No results for input(s): COLORURINE, LABSPEC, PHURINE, GLUCOSEU, HGBUR, BILIRUBINUR, KETONESUR, PROTEINUR, UROBILINOGEN, NITRITE, LEUKOCYTESUR in the last 72 hours.  Invalid input(s): APPERANCEUR    Imaging: No results found.   Medications:      albuterol  2 puff Inhalation Q6H   apixaban  5  mg Oral BID   aspirin EC  81 mg Oral Daily   atorvastatin  80 mg Oral Daily   carvedilol  25 mg Oral BID WC   Chlorhexidine Gluconate Cloth  6 each Topical Q0600   furosemide  40 mg Intravenous BID   hydrALAZINE  100 mg Oral Q8H   insulin aspart  0-9 Units Subcutaneous TID WC   insulin detemir  8 Units Subcutaneous QHS   isosorbide mononitrate  30 mg Oral Daily   acetaminophen, chlorpheniramine-HYDROcodone, guaiFENesin-dextromethorphan, nitroGLYCERIN, ondansetron (ZOFRAN) IV  Assessment/ Plan:  Mr. Jimmy Olson is a 61 y.o.  male with past medical conditions including anemia, two-vessel CAD, diabetes, depression, DVT with Eliquis, and end-stage renal disease on hemodialysis.  Patient presents to the emergency  department with complaints of shortness of breath, cough and nasal congestion.  Patient was admitted for Hyperglycemia [R73.9] NSTEMI (non-ST elevated myocardial infarction) ([place]) [I21.4] Acute respiratory failure with hypoxia (HCC) [J96.01] Acute on chronic congestive heart failure, unspecified heart failure type ([place]) [I50.9] Acute sepsis ([place]) [A41.9] COVID-19 [U07.1]  [facility]/MWF/right IJ PermCath  End-stage renal disease on hemodialysis.  Will maintain outpatient schedule if possible.  Dialysis received at bedside yesterday, UF goal 1 L achieved.  Next treatment scheduled for Friday.  Dialysis coordinator aware of patient and making needed arrangements for outpatient treatment with COVID status.  2. Anemia of chronic kidney disease  Normocytic Lab Results  Component Value Date   HGB 10.2 (L) 08/11/2021  Mircera received outpatient   3. Secondary Hyperparathyroidism:  Lab Results  Component Value Date   CALCIUM 8.4 (L) 08/11/2021   PHOS 5.3 (H) 06/05/2021  Hemoglobin remains at goal we will continue to monitor bone mineral metabolism during this admission.  We will obtain updated phosphorus with dialysis tomorrow  4. 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 Home regimen includes Humalog and Levemir. Most recent hemoglobin A1c is 10.1 on 07/14/21.    Glucose appears elevated around lunchtime daily.  Primary team to manage sliding scale insulin  5.  Hypertension with chronic kidney disease.  Home regimen includes furosemide, carvedilol, hydralazine, and isosorbide.  Currently receiving these medications.  Blood pressure stable    LOS: 3 Colon Flattery, NP 2/23/20231:26 PM

## 2021-08-11 NOTE — Progress Notes (Signed)
Progress Note    Jimmy Olson  ZSW:[id number] DOB: 12-14-1960  DOA: 08/08/2021 PCP: [facility] [place]      Brief Narrative:    Medical records reviewed and are as summarized below:  Jimmy Olson is a 61 y.o. male with medical history significant for insulin-dependent type 2 diabetes, chronic systolic and diastolic CHF (EF 45 to 32%, G1DD 07/15/21), ESRD initiated on HD 05/2021, anemia of CKD,  two-vessel CAD on LHC 06/2020, depression, DVT of axillary vein 05/2021 on Eliquis, recently hospitalized from 1/26 to 1/27 with a TIA, with MRA neck showing ulcerated plaque proximal left ICA with 50 to 60% stenosis.  He presented to the hospital because of dry cough, nasal congestion and shortness of breath.  He is vaccinated against influenza but not COVID.     Assessment/Plan:   Principal Problem:   Acute on chronic combined systolic and diastolic CHF (congestive heart failure) (HCC) Active Problems:   Acute respiratory failure with hypoxia (HCC)   Hypertensive emergency   Uncontrolled type 2 diabetes mellitus with hyperglycemia, with long-term current use of insulin ([place])   Demand ischemia (HCC)   ESRD on hemodialysis ([place])   NSTEMI (non-ST elevated myocardial infarction) ([place])   History of DVT (deep vein thrombosis)   Chronic anticoagulation   COVID-19 virus infection   Anemia of chronic kidney failure, stage 5 (HCC)    Body mass index is 22.38 kg/m.  COVID-19 infection: It is not clear if he has pneumonia from COVID-19 infection.  He reports coughing started on Sunday.  Still feeling very weak and fatigued, SOB. --Continue remdesivir to complete full 5 days   Acute on chronic combined systolic and diastolic CHF:  2D echo in January 2023 showed EF estimated at 45 to 20%, grade 1 diastolic dysfunction. Getting HD and iv lasix per nephrology On 2/22 exam, he does not appear volume overloaded   daily weight and urine output.   Elevated troponin  suspected to be from demand ischemia: Troponins 983 to 3,577.   S/p  IV heparin infusion for total of 48 hours by cardiologist.   Plan for cardiac cath to be arranged in the outpatient setting.  Follow-up with cardiologist for further recommendations.    Acute hypoxic respiratory failure: Improved.  He is tolerating room air.  Hypertensive emergency: BP is better.  Continue antihypertensives  ESRD, anemia of chronic disease, secondary hyperparathyroidism: Plan for hemodialysis today   Follow-up with nephrologist  Type II DM with severe hyperglycemia, hypoglycemia on 08/08/2021: Glucose dropped to 60 last night.  Decrease Levemir from 20 to 8 units nightly.  Use NovoLog as needed for hyperglycemia  Left axillary vein DVT diagnosed on 06/02/2021:  Eliquis held  initially due to heparin drip , now back on eliquis   Diet Order             Diet heart healthy/carb modified Room service appropriate? Yes; Fluid consistency: Thin  Diet effective now                      Consultants: Nephrologist Cardiologist  Procedures: None    Medications:    albuterol  2 puff Inhalation Q6H   apixaban  5 mg Oral BID   aspirin EC  81 mg Oral Daily   atorvastatin  80 mg Oral Daily   carvedilol  25 mg Oral BID WC   Chlorhexidine Gluconate Cloth  6 each Topical Q0600   furosemide  40 mg Intravenous  BID   hydrALAZINE  100 mg Oral Q8H   insulin aspart  0-9 Units Subcutaneous TID WC   insulin detemir  8 Units Subcutaneous QHS   isosorbide mononitrate  30 mg Oral Daily   Continuous Infusions:     Anti-infectives (From admission, onward)    Start     Dose/Rate Route Frequency Ordered Stop   08/09/21 1000  remdesivir 100 mg in sodium chloride 0.9 % 100 mL IVPB       See Hyperspace for full Linked Orders Report.   100 mg 200 mL/hr over 30 Minutes Intravenous Daily 08/08/21 0226 08/10/21 0922   08/09/21 1000  remdesivir 100 mg in sodium chloride 0.9 % 100 mL IVPB  Status:  Discontinued        See Hyperspace for full Linked Orders Report.   100 mg 200 mL/hr over 30 Minutes Intravenous Daily 08/08/21 0337 08/08/21 0340   08/08/21 0345  remdesivir 200 mg in sodium chloride 0.9% 250 mL IVPB  Status:  Discontinued       See Hyperspace for full Linked Orders Report.   200 mg 580 mL/hr over 30 Minutes Intravenous Once 08/08/21 6073 08/08/21 0340   08/08/21 0230  remdesivir 200 mg in sodium chloride 0.9% 250 mL IVPB       See Hyperspace for full Linked Orders Report.   200 mg 580 mL/hr over 30 Minutes Intravenous Once 08/08/21 0226 08/08/21 0322   08/08/21 0030  ceFEPIme (MAXIPIME) 2 g in sodium chloride 0.9 % 100 mL IVPB        2 g 200 mL/hr over 30 Minutes Intravenous  Once 08/08/21 0023 08/08/21 0244   08/08/21 0030  metroNIDAZOLE (FLAGYL) IVPB 500 mg        500 mg 100 mL/hr over 60 Minutes Intravenous  Once 08/08/21 0023 08/08/21 0147   08/08/21 0030  vancomycin (VANCOCIN) IVPB 1000 mg/200 mL premix  Status:  Discontinued        1,000 mg 200 mL/hr over 60 Minutes Intravenous  Once 08/08/21 0023 08/08/21 0026   08/08/21 0030  vancomycin (VANCOREADY) IVPB 1500 mg/300 mL        1,500 mg 150 mL/hr over 120 Minutes Intravenous  Once 08/08/21 0026 08/08/21 0346              Family Communication/Anticipated D/C date and plan/Code Status   DVT prophylaxis:  apixaban (ELIQUIS) tablet 5 mg     Code Status: Full Code  Family Communication: Wife at bedside on rounds    Status is: Inpatient Remains inpatient appropriate because:  severity of illness COVID-19 infection continuing on IV therapy as above, poor PO intake      Subjective:   Seen with wife at bedside.  Reports being extremely weak and tired.  Appetite a little better.  Felt very SOB last night trying to sleep.  Denies fever/chills.  Says feeling worse today.  Objective:    Vitals:   08/11/21 0546 08/11/21 0809 08/11/21 1121 08/11/21 1541  BP:  (!) 143/78 (!) 91/59 115/76  Pulse:  76 78 72   Resp:      Temp:  98.6 F (37 C) 98.7 F (37.1 C) 99.1 F (37.3 C)  TempSrc:      SpO2:  98% 99% 100%  Weight: 62.9 kg     Height:       No data found.   Intake/Output Summary (Last 24 hours) at 08/11/2021 1928 Last data filed at 08/11/2021 1157 Gross per 24 hour  Intake --  Output 1534 ml  Net -1534 ml   Filed Weights   08/10/21 2014 08/10/21 2318 08/11/21 0546  Weight: 63.9 kg 62.5 kg 62.9 kg    Exam:  GEN: appear weak and ill feeling, NAD SKIN: No rashes seen, dry and normal temp EYES: EOMI, hearing grossly normal ENT: MMM CV: RRR PULM: CTA B, normal respiratory effort CNS: AAO x 3, non focal EXT: No edema, normal tone        Data Reviewed:   I have personally reviewed following labs and imaging studies:  Labs: Labs show the following:   Basic Metabolic Panel: Recent Labs  Lab 08/08/21 0004 08/09/21 0547 08/10/21 0618 08/11/21 0619  NA 133* 134* 134* 135  K 4.6 3.6 3.6 3.6  CL 103 97* 97* 97*  CO2 23 26 27 29   GLUCOSE 504* 136* 99 131*  BUN 35* 25* 42* 29*  CREATININE 3.81* 3.33* 4.78* 3.53*  CALCIUM 8.8* 8.6* 8.4* 8.4*   GFR Estimated Creatinine Clearance: 20.3 mL/min (A) (by C-G formula based on SCr of 3.53 mg/dL (H)). Liver Function Tests: Recent Labs  Lab 08/08/21 0004  AST 27  ALT 25  ALKPHOS 131*  BILITOT 1.0  PROT 7.4  ALBUMIN 3.8   No results for input(s): LIPASE, AMYLASE in the last 168 hours. No results for input(s): AMMONIA in the last 168 hours. Coagulation profile Recent Labs  Lab 08/08/21 0004  INR 1.2    CBC: Recent Labs  Lab 08/08/21 0004 08/09/21 0547 08/10/21 0618 08/11/21 0619  WBC 7.3 4.6 3.6* 2.9*  NEUTROABS 6.0  --   --   --   HGB 9.4* 10.0* 10.0* 10.2*  HCT 28.9* 29.2* 30.1* 30.3*  MCV 89.2 86.6 86.5 86.3  PLT 223 190 179 183   Cardiac Enzymes: No results for input(s): CKTOTAL, CKMB, CKMBINDEX, TROPONINI in the last 168 hours. BNP (last 3 results) No results for input(s): PROBNP in the  last 8760 hours. CBG: Recent Labs  Lab 08/11/21 0000 08/11/21 0810 08/11/21 1123 08/11/21 1543 08/11/21 1713  GLUCAP 142* 103* 261* 62* 216*   D-Dimer: No results for input(s): DDIMER in the last 72 hours. Hgb A1c: No results for input(s): HGBA1C in the last 72 hours. Lipid Profile: No results for input(s): CHOL, HDL, LDLCALC, TRIG, CHOLHDL, LDLDIRECT in the last 72 hours.  Thyroid function studies: No results for input(s): TSH, T4TOTAL, T3FREE, THYROIDAB in the last 72 hours.  Invalid input(s): FREET3 Anemia work up: No results for input(s): VITAMINB12, FOLATE, FERRITIN, TIBC, IRON, RETICCTPCT in the last 72 hours. Sepsis Labs:             LOS: 3 days   Ezekiel Slocumb, DO [facility]     08/11/2021, 7:28 PM

## 2021-08-12 DIAGNOSIS — I5043 Acute on chronic combined systolic (congestive) and diastolic (congestive) heart failure: Secondary | ICD-10-CM | POA: Diagnosis not present

## 2021-08-12 DIAGNOSIS — R531 Weakness: Secondary | ICD-10-CM

## 2021-08-12 LAB — CBC
HCT: 30.6 % — ABNORMAL LOW (ref 39.0–52.0)
Hemoglobin: 10.5 g/dL — ABNORMAL LOW (ref 13.0–17.0)
MCH: 29.6 pg (ref 26.0–34.0)
MCHC: 34.3 g/dL (ref 30.0–36.0)
MCV: 86.2 fL (ref 80.0–100.0)
Platelets: 190 10*3/uL (ref 150–400)
RBC: 3.55 MIL/uL — ABNORMAL LOW (ref 4.22–5.81)
RDW: 12.6 % (ref 11.5–15.5)
WBC: 3.3 10*3/uL — ABNORMAL LOW (ref 4.0–10.5)
nRBC: 0 % (ref 0.0–0.2)

## 2021-08-12 LAB — GLUCOSE, CAPILLARY
Glucose-Capillary: 214 mg/dL — ABNORMAL HIGH (ref 70–99)
Glucose-Capillary: 280 mg/dL — ABNORMAL HIGH (ref 70–99)
Glucose-Capillary: 178 mg/dL — ABNORMAL HIGH (ref 70–99)
Glucose-Capillary: 119 mg/dL — ABNORMAL HIGH (ref 70–99)
Glucose-Capillary: 304 mg/dL — ABNORMAL HIGH (ref 70–99)

## 2021-08-12 LAB — BASIC METABOLIC PANEL
Anion gap: 11 (ref 5–15)
BUN: 51 mg/dL — ABNORMAL HIGH (ref 6–20)
CO2: 27 mmol/L (ref 22–32)
Calcium: 8.2 mg/dL — ABNORMAL LOW (ref 8.9–10.3)
Chloride: 97 mmol/L — ABNORMAL LOW (ref 98–111)
Creatinine, Ser: 5.29 mg/dL — ABNORMAL HIGH (ref 0.61–1.24)
GFR, Estimated: 12 mL/min — ABNORMAL LOW (ref >60)
Glucose, Bld: 120 mg/dL — ABNORMAL HIGH (ref 70–99)
Potassium: 3.9 mmol/L (ref 3.5–5.1)
Sodium: 135 mmol/L (ref 135–145)

## 2021-08-12 LAB — MAGNESIUM: Magnesium: 2.2 mg/dL (ref 1.7–2.4)

## 2021-08-12 LAB — PHOSPHORUS: Phosphorus: 5.9 mg/dL — ABNORMAL HIGH (ref 2.5–4.6)

## 2021-08-12 LAB — C-REACTIVE PROTEIN: CRP: 2.5 mg/dL — ABNORMAL HIGH (ref <1.0)

## 2021-08-12 MED ORDER — HEPARIN SODIUM (PORCINE) 1000 UNIT/ML IJ SOLN
INTRAMUSCULAR | Status: AC
  Filled 2021-08-12: qty 10

## 2021-08-12 MED ORDER — FUROSEMIDE 40 MG PO TABS
80.0000 mg | ORAL_TABLET | Freq: Every day | ORAL | Status: DC | PRN
  Filled 2021-08-12: qty 2

## 2021-08-12 NOTE — Progress Notes (Signed)
Progress Note   Patient: Jimmy Olson XBD:[id number] DOB: 02/23/1961 DOA: 08/08/2021     4 DOS: the patient was seen and examined on 08/12/2021   Brief hospital course: Jimmy Olson is a 61 y.o. male with medical history significant for insulin-dependent type 2 diabetes, chronic systolic and diastolic CHF (EF 45 to 83%, G1DD 07/15/21), ESRD initiated on HD 05/2021, anemia of CKD,  two-vessel CAD on LHC 06/2020, depression, DVT of axillary vein 05/2021 on Eliquis, recently hospitalized from 1/26 to 1/27 with a TIA, with MRA neck showing ulcerated plaque proximal left ICA with 50 to 60% stenosis.  He presented to the hospital because of dry cough, nasal congestion and shortness of breath.  He is vaccinated against influenza but not COVID.  Assessment and Plan:  COVID-19 infection: It is not clear if he has pneumonia from COVID-19 infection.  He reports coughing started on Sunday.  Still feeling very weak and fatigued, SOB. --Continue remdesivir to complete full 5 days --Supportive care --Airborne & contact precautions   Acute on chronic combined systolic and diastolic CHF:  2D echo in January 2023 showed EF estimated at 45 to 41%, grade 1 diastolic dysfunction. Dialysis per nephrology. Transition IV lasix back to home PO Lasix PRN sob/edema Daily weight and urine output.   Elevated troponin suspected to be from demand ischemia: Troponins 983 to 3,577.   S/p  IV heparin infusion x 48 hours by cardiologist.   Plan for cardiac cath to be arranged in the outpatient setting.  Follow-up with cardiologist for further recommendations.    Acute hypoxic respiratory failure: POA due to Covid-19 and ?volume overload.  Resolved.   Hypertensive emergency: POA, BP improved.   Continue antihypertensives   ESRD, anemia of chronic disease, secondary hyperparathyroidism: Nephrology following for dialysis.  Type II DM with severe hyperglycemia, hypoglycemia on 08/08/2021: Glucose dropped to 60 last night.   Decrease Levemir from 20 to 8 units nightly.  Use NovoLog as needed for hyperglycemia   Left axillary vein DVT diagnosed on 06/02/2021:  Eliquis held  initially due to heparin drip , now back on eliquis     Subjective: Pt says he feels a little better today, but still weak and tired.  Has not been up to walk around but wants to see how he feels with mobility.  Otherwise hopes to go home soon but feels far too weak still.  Physical Exam: Vitals:   08/12/21 1803 08/12/21 1811 08/12/21 1813 08/12/21 1815  BP: 115/76  (!) 103/59   Pulse: 76     Resp: (!) 22 (!) 21 19 15   Temp:      TempSrc:      SpO2: 100%     Weight:      Height:       General exam: awake, alert, no acute distress, mildly ill-appearing HEENT: moist mucus membranes, hearing grossly normal  Respiratory system: CTAB, no wheezes, rales or rhonchi, normal respiratory effort. Cardiovascular system: normal S1/S2, RRR, no pedal edema.   Gastrointestinal system: soft, NT, ND, no HSM felt, +bowel sounds. Central nervous system: A&O x3. no gross focal neurologic deficits, normal speech Extremities: moves all, no edema, normal tone Skin: dry, intact, normal temperature Psychiatry: normal mood, congruent affect   Data Reviewed:  Labs notable for Cl 97, glucose 120, BUN 51, Cr 5.29, CRP 2.5, WBC 3.3, Hbg 10.5,   Family Communication: wife at bedside on rounds  Disposition: Status is: Inpatient Remains inpatient appropriate because: severity of illness, on IV  therapies, anticipate d/c in 24-48 hours if further clinical improvement           Planned Discharge Destination: Home     Time spent: 35 minutes  Author: Ezekiel Slocumb, DO 08/12/2021 6:22 PM  For on call review [email or web address].

## 2021-08-12 NOTE — Progress Notes (Signed)
[facility]  ROUNDING NOTE   Subjective:   Jimmy Olson is a 61 year old Hispanic male with past medical conditions including anemia, two-vessel CAD, diabetes, depression, DVT with Eliquis, and end-stage renal disease on hemodialysis.  Patient presents to the emergency department with complaints of shortness of breath, cough and nasal congestion.  Patient was admitted for Hyperglycemia [R73.9] NSTEMI (non-ST elevated myocardial infarction) ([place]) [I21.4] Acute respiratory failure with hypoxia (HCC) [J96.01] Acute on chronic congestive heart failure, unspecified heart failure type ([place]) [I50.9] Acute sepsis ([place]) [A41.9] COVID-19 [U07.1]  Patient is known to our practice and receives outpatient dialysis treatments at [facility] on a MWF schedule, supervised by Dr. Candiss Norse.    Patient resting comfortably Denies pain and discomfort Continues to complain of fatigue and poor appetite Intermittent shortness of breath, remains on room air   Objective:  Vital signs in last 24 hours:  Temp:  [98.1 F (36.7 C)-99.1 F (37.3 C)] 98.7 F (37.1 C) (02/24 0447) Pulse Rate:  [71-78] 71 (02/24 0447) Resp:  [17-18] 18 (02/24 0447) BP: (91-155)/(59-85) 147/73 (02/24 0447) SpO2:  [99 %-100 %] 100 % (02/24 0447) Weight:  [62.8 kg] 62.8 kg (02/24 0447)  Weight change: -0.341 kg Filed Weights   08/10/21 2318 08/11/21 0546 08/12/21 0447  Weight: 62.5 kg 62.9 kg 62.8 kg    Intake/Output: I/O last 3 completed shifts: In: -  Out: 8469 [Urine:800; Other:1034]   Intake/Output this shift:  No intake/output data recorded.  Physical Exam: General: NAD  Head: Normocephalic, atraumatic. Moist oral mucosal membranes  Eyes: Anicteric  Lungs:  Clear bilaterally, normal effort  Heart: Regular rate and rhythm  Abdomen:  Soft, nontender  Extremities: Trace peripheral edema.  Neurologic: Nonfocal, moving all four extremities  Skin: No lesions  Access: Right IJ PermCath     Basic Metabolic Panel: Recent Labs  Lab 08/08/21 0004 08/09/21 0547 08/10/21 0618 08/11/21 0619 08/12/21 0647  NA 133* 134* 134* 135 135  K 4.6 3.6 3.6 3.6 3.9  CL 103 97* 97* 97* 97*  CO2 23 26 27 29 27   GLUCOSE 504* 136* 99 131* 120*  BUN 35* 25* 42* 29* 51*  CREATININE 3.81* 3.33* 4.78* 3.53* 5.29*  CALCIUM 8.8* 8.6* 8.4* 8.4* 8.2*  MG  --   --   --   --  2.2  PHOS  --   --   --   --  5.9*     Liver Function Tests: Recent Labs  Lab 08/08/21 0004  AST 27  ALT 25  ALKPHOS 131*  BILITOT 1.0  PROT 7.4  ALBUMIN 3.8    No results for input(s): LIPASE, AMYLASE in the last 168 hours. No results for input(s): AMMONIA in the last 168 hours.  CBC: Recent Labs  Lab 08/08/21 0004 08/09/21 0547 08/10/21 0618 08/11/21 0619 08/12/21 0647  WBC 7.3 4.6 3.6* 2.9* 3.3*  NEUTROABS 6.0  --   --   --   --   HGB 9.4* 10.0* 10.0* 10.2* 10.5*  HCT 28.9* 29.2* 30.1* 30.3* 30.6*  MCV 89.2 86.6 86.5 86.3 86.2  PLT 223 190 179 183 190     Cardiac Enzymes: No results for input(s): CKTOTAL, CKMB, CKMBINDEX, TROPONINI in the last 168 hours.  BNP: Invalid input(s): POCBNP  CBG: Recent Labs  Lab 08/11/21 0810 08/11/21 1123 08/11/21 1543 08/11/21 1713 08/11/21 2018  GLUCAP 103* 261* 62* 216* 253*     Microbiology: Results for orders placed or performed during the hospital encounter of  08/08/21  Culture, blood (Routine x 2)     Status: None (Preliminary result)   Collection Time: 08/08/21 12:04 AM   Specimen: Left Antecubital; Blood  Result Value Ref Range Status   Specimen Description LEFT ANTECUBITAL  Final   Special Requests   Final    BOTTLES DRAWN AEROBIC AND ANAEROBIC Blood Culture results may not be optimal due to an excessive volume of blood received in culture bottles   Culture   Final    NO GROWTH 4 DAYS Performed at [facility], [address], [place], [place] [address]    Report Status PENDING  Incomplete  Resp Panel by RT-PCR (Flu A&B,  Covid) Nasopharyngeal Swab     Status: Abnormal   Collection Time: 08/08/21 12:13 AM   Specimen: Nasopharyngeal Swab; Nasopharyngeal(NP) swabs in vial transport medium  Result Value Ref Range Status   SARS Coronavirus 2 by RT PCR POSITIVE (A) NEGATIVE Final    Comment: (NOTE) SARS-CoV-2 target nucleic acids are DETECTED.  The SARS-CoV-2 RNA is generally detectable in upper respiratory specimens during the acute phase of infection. Positive results are indicative of the presence of the identified virus, but do not rule out bacterial infection or co-infection with other pathogens not detected by the test. Clinical correlation with patient history and other diagnostic information is necessary to determine patient infection status. The expected result is Negative.  Fact Sheet for Patients: EntrepreneurPulse.com.au  Fact Sheet for Healthcare Providers: IncredibleEmployment.be  This test is not yet approved or cleared by the [place] FDA and  has been authorized for detection and/or diagnosis of SARS-CoV-2 by FDA under an Emergency Use Authorization (EUA).  This EUA will remain in effect (meaning this test can be used) for the duration of  the COVID-19 declaration under Section 564(b)(1) of the A ct, 21 U.S.C. section 360bbb-3(b)(1), unless the authorization is terminated or revoked sooner.     Influenza A by PCR NEGATIVE NEGATIVE Final   Influenza B by PCR NEGATIVE NEGATIVE Final    Comment: (NOTE) The Xpert Xpress SARS-CoV-2/FLU/RSV plus assay is intended as an aid in the diagnosis of influenza from Nasopharyngeal swab specimens and should not be used as a sole basis for treatment. Nasal washings and aspirates are unacceptable for Xpert Xpress SARS-CoV-2/FLU/RSV testing.  Fact Sheet for Patients: EntrepreneurPulse.com.au  Fact Sheet for Healthcare Providers: IncredibleEmployment.be  This test is not  yet approved or cleared by the [place] FDA and has been authorized for detection and/or diagnosis of SARS-CoV-2 by FDA under an Emergency Use Authorization (EUA). This EUA will remain in effect (meaning this test can be used) for the duration of the COVID-19 declaration under Section 564(b)(1) of the Act, 21 U.S.C. section 360bbb-3(b)(1), unless the authorization is terminated or revoked.  Performed at [facility], [address] [place], [place] [address]   Culture, blood (Routine x 2)     Status: None (Preliminary result)   Collection Time: 08/08/21  1:50 AM   Specimen: BLOOD  Result Value Ref Range Status   Specimen Description BLOOD LEFT  Final   Special Requests   Final    BOTTLES DRAWN AEROBIC AND ANAEROBIC Blood Culture adequate volume   Culture   Final    NO GROWTH 4 DAYS Performed at [facility], [address] [place], [place] [address]    Report Status PENDING  Incomplete    Coagulation Studies: No results for input(s): LABPROT, INR in the last 72 hours.   Urinalysis: No results  for input(s): COLORURINE, LABSPEC, [place], GLUCOSEU, HGBUR, BILIRUBINUR, KETONESUR, PROTEINUR, UROBILINOGEN, NITRITE, LEUKOCYTESUR in the last 72 hours.  Invalid input(s): APPERANCEUR    Imaging: No results found.   Medications:      albuterol  2 puff Inhalation Q6H   apixaban  5 mg Oral BID   aspirin EC  81 mg Oral Daily   atorvastatin  80 mg Oral Daily   carvedilol  25 mg Oral BID WC   Chlorhexidine Gluconate Cloth  6 each Topical Q0600   furosemide  40 mg Intravenous BID   heparin sodium (porcine)       hydrALAZINE  100 mg Oral Q8H   insulin aspart  0-9 Units Subcutaneous TID WC   insulin detemir  8 Units Subcutaneous QHS   isosorbide mononitrate  30 mg Oral Daily   acetaminophen, chlorpheniramine-HYDROcodone, guaiFENesin-dextromethorphan, nitroGLYCERIN, ondansetron (ZOFRAN) IV  Assessment/ Plan:  Mr. Jimmy Olson is a 61 y.o.  male with  past medical conditions including anemia, two-vessel CAD, diabetes, depression, DVT with Eliquis, and end-stage renal disease on hemodialysis.  Patient presents to the emergency department with complaints of shortness of breath, cough and nasal congestion.  Patient was admitted for Hyperglycemia [R73.9] NSTEMI (non-ST elevated myocardial infarction) ([place]) [I21.4] Acute respiratory failure with hypoxia (HCC) [J96.01] Acute on chronic congestive heart failure, unspecified heart failure type ([place]) [I50.9] Acute sepsis ([place]) [A41.9] COVID-19 [U07.1]  [facility]/MWF/right IJ PermCath  End-stage renal disease on hemodialysis.  Will maintain outpatient schedule if possible.    Plans to dialyze patient later today due to COVID status.  UF goal 1 L as tolerated.  Next treatment scheduled for Monday.  2. Anemia of chronic kidney disease  Normocytic Lab Results  Component Value Date   HGB 10.5 (L) 08/12/2021  Mircera received outpatient Hemoglobin remains at goal  3. Secondary Hyperparathyroidism:  Lab Results  Component Value Date   CALCIUM 8.2 (L) 08/12/2021   PHOS 5.9 (H) 08/12/2021  We will continue to monitor bone mineral metabolism during this admission.  Phosphorus remains elevated yet acceptable.  No need for binders at this time  4. 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 Home regimen includes Humalog and Levemir. Most recent hemoglobin A1c is 10.1 on 07/14/21.    Glucose remains elevated.  Primary team to manage SSI  5.  Hypertension with chronic kidney disease.  Home regimen includes furosemide, carvedilol, hydralazine, and isosorbide.  Currently receiving these medications.  Blood pressure 111/65    LOS: 4 Colon Flattery, NP 2/24/202310:06 AM

## 2021-08-12 NOTE — Hospital Course (Addendum)
Jimmy Olson is a 61 y.o. male with medical history significant for insulin-dependent type 2 diabetes, chronic systolic and diastolic CHF (EF 45 to 72%, G1DD 07/15/21), ESRD initiated on HD 05/2021, anemia of CKD,  two-vessel CAD on LHC 06/2020, depression, DVT of axillary vein 05/2021 on Eliquis, recently hospitalized from 1/26 to 1/27 with a TIA, with MRA neck showing ulcerated plaque proximal left ICA with 50 to 60% stenosis.  He presented to the hospital because of dry cough, nasal congestion and shortness of breath.  He is vaccinated against influenza but not COVID.  2/25: pt remains profoundly weak and fatigued   2/26: pt doing better, tolerance for ambulating improved sufficiently to manage at home and get to outpatient dialysis.  Clinically improved and medically stable for discharge home.  Patient and wife in agreement.  Next dialysis is Monday.

## 2021-08-12 NOTE — Assessment & Plan Note (Addendum)
Due to COVID infection and chronic comorbidities. PT and OT evaluations.  They declined [place] therapy at discharge. Mobilize as tolerated, as much as possible. Optimize nutrition.

## 2021-08-13 DIAGNOSIS — I5043 Acute on chronic combined systolic (congestive) and diastolic (congestive) heart failure: Secondary | ICD-10-CM | POA: Diagnosis not present

## 2021-08-13 LAB — BASIC METABOLIC PANEL
Anion gap: 12 (ref 5–15)
BUN: 30 mg/dL — ABNORMAL HIGH (ref 6–20)
CO2: 29 mmol/L (ref 22–32)
Calcium: 7.9 mg/dL — ABNORMAL LOW (ref 8.9–10.3)
Chloride: 91 mmol/L — ABNORMAL LOW (ref 98–111)
Creatinine, Ser: 3.62 mg/dL — ABNORMAL HIGH (ref 0.61–1.24)
GFR, Estimated: 19 mL/min — ABNORMAL LOW (ref >60)
Glucose, Bld: 351 mg/dL — ABNORMAL HIGH (ref 70–99)
Potassium: 3.9 mmol/L (ref 3.5–5.1)
Sodium: 132 mmol/L — ABNORMAL LOW (ref 135–145)

## 2021-08-13 LAB — CBC
HCT: 27.7 % — ABNORMAL LOW (ref 39.0–52.0)
Hemoglobin: 9.3 g/dL — ABNORMAL LOW (ref 13.0–17.0)
MCH: 28.6 pg (ref 26.0–34.0)
MCHC: 33.6 g/dL (ref 30.0–36.0)
MCV: 85.2 fL (ref 80.0–100.0)
Platelets: 187 10*3/uL (ref 150–400)
RBC: 3.25 MIL/uL — ABNORMAL LOW (ref 4.22–5.81)
RDW: 12.2 % (ref 11.5–15.5)
WBC: 3.9 10*3/uL — ABNORMAL LOW (ref 4.0–10.5)
nRBC: 0 % (ref 0.0–0.2)

## 2021-08-13 LAB — CULTURE, BLOOD (ROUTINE X 2)
Culture: NO GROWTH
Culture: NO GROWTH
Special Requests: ADEQUATE

## 2021-08-13 LAB — GLUCOSE, CAPILLARY
Glucose-Capillary: 210 mg/dL — ABNORMAL HIGH (ref 70–99)
Glucose-Capillary: 181 mg/dL — ABNORMAL HIGH (ref 70–99)
Glucose-Capillary: 243 mg/dL — ABNORMAL HIGH (ref 70–99)
Glucose-Capillary: 196 mg/dL — ABNORMAL HIGH (ref 70–99)

## 2021-08-13 NOTE — Progress Notes (Signed)
Progress Note   Patient: Jimmy Olson:[id number] DOB: February 28, 1961 DOA: 08/08/2021     5 DOS: the patient was seen and examined on 08/13/2021   Brief hospital course: IKER NUTTALL is a 61 y.o. male with medical history significant for insulin-dependent type 2 diabetes, chronic systolic and diastolic CHF (EF 45 to 26%, G1DD 07/15/21), ESRD initiated on HD 05/2021, anemia of CKD,  two-vessel CAD on [facility] 06/2020, depression, DVT of axillary vein 05/2021 on Eliquis, recently hospitalized from 1/26 to 1/27 with a TIA, with MRA neck showing ulcerated plaque proximal left ICA with 50 to 60% stenosis.  He presented to the hospital because of dry cough, nasal congestion and shortness of breath.  He is vaccinated against influenza but not COVID.  2/25: pt remains profoundly weak and fatigued   Assessment and Plan: * Acute on chronic combined systolic and diastolic CHF (congestive heart failure) ([place])- (present on admission) Secondary to hypertensive emergency, possible primary ACS.  No missed dialysis sessions Treated with IV Lasix, now on home PO Lasix PRN Continue home carvedilol, hydralazine, Imdur Daily weights with intake and output monitoring EF 45 to 50% with G1 DD on 07/15/2021  Acute respiratory failure with hypoxia ([place])- (present on admission) Resolved.  Suspect mostly related to CHF than to COVID.  Presented with shortness of breath O2 sat 87% on room air, tachypnea with increased work of breathing. Supplemental O2 to keep sats over 94% Completed remdesivir for COVID Antibiotics not continued for possible bacterial pneumonia given procalcitonin of 0.15  Generalized weakness Due to COVID infection and chronic comorbidities. PT and OT evaluations. Mobilize as tolerated, as much as possible. Out of bed to chair at least daily. Optimize nutrition.  Anemia of chronic kidney failure, stage 5 (HCC)- (present on admission) Hemoglobin stable and at baseline.  Continue to monitor   COVID-19  virus infection Was negative for COVID on 07/14/2021 so recent infection suspected Symptomatic for cough and shortness of breath with nasal congestion but no fever.  Chest x-ray more consistent with CHF Completed remdesivir Antitussives, albuterol Steroids were deferred as it was felt hypoxia was mild and believed mostly related to CHF rather than COVID-pneumonia.  Now on room air.  Chronic anticoagulation As above.  Eliquis on hold  History of DVT (deep vein thrombosis) Axillary DVT in December 2022 Continue Eliquis   NSTEMI (non-ST elevated myocardial infarction) ([place])- (present on admission) Patient is denying chest pain and EKG nonacute but with troponin [phone], Could be related to demand ischemia versus type I NSTEMI Initially placed on heparin infusion. Continue rosuvastatin, isosorbide Two-vessel CAD on [facility] 06/2020 Cardiology consulted. Continue ASA, Lipitor, Coreg, Imdur Resume Eliquis No further cardiac testing or intervention at this time. Cardiology signed off. Outpatient follow up.  ESRD on hemodialysis [facility]) Initiated on hemodialysis in December 2022 Nephrology consult for continuation of dialysis  Demand ischemia [facility])- (present on admission) Due to hypoxia and ESRD, CHF. No chest pain or acute ischemic ECG changes.  Uncontrolled type 2 diabetes mellitus with hyperglycemia, with long-term current use of insulin (HCC) Blood sugar over 500, given IV insulin 10 units in the ED.   Sugars have improved. Continue basal Levemir Sliding scale insulin  Hypertensive emergency BP 208/106 on arrival To address medication compliance will improve Resuming home antihypertensives of carvedilol, hydralazine, Imdur        Subjective: Pt was sleeping but woke to voice this AM.  He reports feeling bad this AM which he attributes to having a headache and being  unable to sleep overnight after returning from dialysis.  Reports feeling extremely weak and has not been up out  of bed yet.    Physical Exam: Vitals:   08/12/21 2355 08/13/21 0530 08/13/21 0851 08/13/21 1142  BP: (!) 165/86 (!) 157/74 (!) 148/79 121/70  Pulse: 76 72 73 77  Resp: 18 18 16 16   Temp: 97.8 F (36.6 C) 98.4 F (36.9 C) 98 F (36.7 C) 97.8 F (36.6 C)  TempSrc:      SpO2: 100% 100% 100% 100%  Weight:  62.8 kg    Height:       General exam: awake, alert, no acute distress, ill-appearing HEENT: moist mucus membranes, hearing grossly normal  Respiratory system: CTAB with diminished bases, no wheezes, rales or rhonchi, normal respiratory effort. Cardiovascular system: normal S1/S2, RRR, R IJ dialysis catheter in place.   Gastrointestinal system: soft, NT, ND, no HSM felt, +bowel sounds. Central nervous system: no gross focal neurologic deficits, normal speech Extremities: no edema, normal tone Skin: dry, intact, normal temperature Psychiatry: normal mood, congruent affect   Data Reviewed:  Labs notable for Na 132, Cl 91, glucose 351 (CBGs later 210, 181), BUN 30, Cr 3.62, Ca 7.9, WBC 3.9, Hbg 9.3  Family Communication: none at bedside today.  Wife at bedside past two days on rounds  Disposition: Status is: Inpatient Remains inpatient appropriate because: severity of illness with ongoing profound weakness. Anticipate d/c tomorrow if furhter improvement after seen by PT and OT.          Planned Discharge Destination: Home with Home Health     Time spent: 35 minutes  Author: Ezekiel Slocumb, DO 08/13/2021 2:30 PM  For on call review [email or web address].

## 2021-08-13 NOTE — Assessment & Plan Note (Signed)
Due to hypoxia and ESRD, CHF. No chest pain or acute ischemic ECG changes.

## 2021-08-13 NOTE — Progress Notes (Signed)
[facility]  ROUNDING NOTE   Subjective:   Jimmy Olson is a 61 year old Hispanic male with past medical conditions including anemia, two-vessel CAD, diabetes, depression, DVT with Eliquis, and end-stage renal disease on hemodialysis.  Patient presents to the emergency department with complaints of shortness of breath, cough and nasal congestion.  Patient was admitted for Hyperglycemia [R73.9] NSTEMI (non-ST elevated myocardial infarction) ([place]) [I21.4] Acute respiratory failure with hypoxia (HCC) [J96.01] Acute on chronic congestive heart failure, unspecified heart failure type ([place]) [I50.9] Acute sepsis ([place]) [A41.9] COVID-19 [U07.1]  Patient is known to our practice and receives outpatient dialysis treatments at [facility] on a MWF schedule, supervised by Dr. Candiss Norse.    Patient resting comfortably Patient offers no new specific physical complaint.  Patient states he is feeling better than before  Objective:  Vital signs in last 24 hours:  Temp:  [97.8 F (36.6 C)-98.6 F (37 C)] 98.4 F (36.9 C) (02/25 0530) Pulse Rate:  [71-78] 72 (02/25 0530) Resp:  [6-31] 18 (02/25 0530) BP: (103-184)/(59-87) 157/74 (02/25 0530) SpO2:  [99 %-100 %] 100 % (02/25 0530) Weight:  [62.8 kg-63.7 kg] 62.8 kg (02/25 0530)  Weight change: 0.9 kg Filed Weights   08/12/21 1425 08/12/21 1956 08/13/21 0530  Weight: 63.7 kg 63.3 kg 62.8 kg    Intake/Output: I/O last 3 completed shifts: In: 240 [P.O.:240] Out: -1    Intake/Output this shift:  No intake/output data recorded.  Physical Exam: General: NAD  Head: Normocephalic, atraumatic. Moist oral mucosal membranes  Eyes: Anicteric  Lungs:  Clear bilaterally, normal effort  Heart: Regular rate and rhythm  Abdomen:  Soft, nontender  Extremities: Trace peripheral edema.  Neurologic: Nonfocal, moving all four extremities  Skin: No lesions  Access: Right IJ PermCath    Basic Metabolic Panel: Recent Labs  Lab  08/09/21 0547 08/10/21 0618 08/11/21 0619 08/12/21 0647 08/13/21 0358  NA 134* 134* 135 135 132*  K 3.6 3.6 3.6 3.9 3.9  CL 97* 97* 97* 97* 91*  CO2 26 27 29 27 29   GLUCOSE 136* 99 131* 120* 351*  BUN 25* 42* 29* 51* 30*  CREATININE 3.33* 4.78* 3.53* 5.29* 3.62*  CALCIUM 8.6* 8.4* 8.4* 8.2* 7.9*  MG  --   --   --  2.2  --   PHOS  --   --   --  5.9*  --     Liver Function Tests: Recent Labs  Lab 08/08/21 0004  AST 27  ALT 25  ALKPHOS 131*  BILITOT 1.0  PROT 7.4  ALBUMIN 3.8   No results for input(s): LIPASE, AMYLASE in the last 168 hours. No results for input(s): AMMONIA in the last 168 hours.  CBC: Recent Labs  Lab 08/08/21 0004 08/09/21 0547 08/10/21 0618 08/11/21 0619 08/12/21 0647 08/13/21 0358  WBC 7.3 4.6 3.6* 2.9* 3.3* 3.9*  NEUTROABS 6.0  --   --   --   --   --   HGB 9.4* 10.0* 10.0* 10.2* 10.5* 9.3*  HCT 28.9* 29.2* 30.1* 30.3* 30.6* 27.7*  MCV 89.2 86.6 86.5 86.3 86.2 85.2  PLT 223 190 179 183 190 187    Cardiac Enzymes: No results for input(s): CKTOTAL, CKMB, CKMBINDEX, TROPONINI in the last 168 hours.  BNP: Invalid input(s): POCBNP  CBG: Recent Labs  Lab 08/12/21 1007 08/12/21 1239 08/12/21 1701 08/12/21 1806 08/12/21 2048  GLUCAP 214* 280* 178* 119* 304*    Microbiology: Results for orders placed or performed during the hospital encounter  of 08/08/21  Culture, blood (Routine x 2)     Status: None   Collection Time: 08/08/21 12:04 AM   Specimen: Left Antecubital; Blood  Result Value Ref Range Status   Specimen Description LEFT ANTECUBITAL  Final   Special Requests   Final    BOTTLES DRAWN AEROBIC AND ANAEROBIC Blood Culture results may not be optimal due to an excessive volume of blood received in culture bottles   Culture   Final    NO GROWTH 5 DAYS Performed at [facility], [address] [place], [place] [address]    Report Status 08/13/2021 FINAL  Final  Resp Panel by RT-PCR (Flu A&B, Covid) Nasopharyngeal Swab      Status: Abnormal   Collection Time: 08/08/21 12:13 AM   Specimen: Nasopharyngeal Swab; Nasopharyngeal(NP) swabs in vial transport medium  Result Value Ref Range Status   SARS Coronavirus 2 by RT PCR POSITIVE (A) NEGATIVE Final    Comment: (NOTE) SARS-CoV-2 target nucleic acids are DETECTED.  The SARS-CoV-2 RNA is generally detectable in upper respiratory specimens during the acute phase of infection. Positive results are indicative of the presence of the identified virus, but do not rule out bacterial infection or co-infection with other pathogens not detected by the test. Clinical correlation with patient history and other diagnostic information is necessary to determine patient infection status. The expected result is Negative.  Fact Sheet for Patients: EntrepreneurPulse.com.au  Fact Sheet for Healthcare Providers: IncredibleEmployment.be  This test is not yet approved or cleared by the [place] FDA and  has been authorized for detection and/or diagnosis of SARS-CoV-2 by FDA under an Emergency Use Authorization (EUA).  This EUA will remain in effect (meaning this test can be used) for the duration of  the COVID-19 declaration under Section 564(b)(1) of the A ct, 21 U.S.C. section 360bbb-3(b)(1), unless the authorization is terminated or revoked sooner.     Influenza A by PCR NEGATIVE NEGATIVE Final   Influenza B by PCR NEGATIVE NEGATIVE Final    Comment: (NOTE) The Xpert Xpress SARS-CoV-2/FLU/RSV plus assay is intended as an aid in the diagnosis of influenza from Nasopharyngeal swab specimens and should not be used as a sole basis for treatment. Nasal washings and aspirates are unacceptable for Xpert Xpress SARS-CoV-2/FLU/RSV testing.  Fact Sheet for Patients: EntrepreneurPulse.com.au  Fact Sheet for Healthcare Providers: IncredibleEmployment.be  This test is not yet approved or cleared by  the [place] FDA and has been authorized for detection and/or diagnosis of SARS-CoV-2 by FDA under an Emergency Use Authorization (EUA). This EUA will remain in effect (meaning this test can be used) for the duration of the COVID-19 declaration under Section 564(b)(1) of the Act, 21 U.S.C. section 360bbb-3(b)(1), unless the authorization is terminated or revoked.  Performed at [facility], [address] [place], [place] [address]   Culture, blood (Routine x 2)     Status: None   Collection Time: 08/08/21  1:50 AM   Specimen: BLOOD  Result Value Ref Range Status   Specimen Description BLOOD LEFT  Final   Special Requests   Final    BOTTLES DRAWN AEROBIC AND ANAEROBIC Blood Culture adequate volume   Culture   Final    NO GROWTH 5 DAYS Performed at [facility], [address] [place], [place] [address]    Report Status 08/13/2021 FINAL  Final    Coagulation Studies: No results for input(s): LABPROT, INR in the last 72 hours.   Urinalysis: No results for  input(s): COLORURINE, LABSPEC, PHURINE, GLUCOSEU, HGBUR, BILIRUBINUR, KETONESUR, PROTEINUR, UROBILINOGEN, NITRITE, LEUKOCYTESUR in the last 72 hours.  Invalid input(s): APPERANCEUR    Imaging: No results found.   Medications:      albuterol  2 puff Inhalation Q6H   apixaban  5 mg Oral BID   aspirin EC  81 mg Oral Daily   atorvastatin  80 mg Oral Daily   carvedilol  25 mg Oral BID WC   Chlorhexidine Gluconate Cloth  6 each Topical Q0600   hydrALAZINE  100 mg Oral Q8H   insulin aspart  0-9 Units Subcutaneous TID WC   insulin detemir  8 Units Subcutaneous QHS   isosorbide mononitrate  30 mg Oral Daily   acetaminophen, chlorpheniramine-HYDROcodone, furosemide, guaiFENesin-dextromethorphan, nitroGLYCERIN, ondansetron (ZOFRAN) IV  Assessment/ Plan:  Mr. Jimmy Olson is a 61 y.o.  male with past medical conditions including anemia, two-vessel CAD, diabetes, depression, DVT with Eliquis, and  end-stage renal disease on hemodialysis.  Patient presents to the emergency department with complaints of shortness of breath, cough and nasal congestion.  Patient was admitted for Hyperglycemia [R73.9] NSTEMI (non-ST elevated myocardial infarction) ([place]) [I21.4] Acute respiratory failure with hypoxia (HCC) [J96.01] Acute on chronic congestive heart failure, unspecified heart failure type ([place]) [I50.9] Acute sepsis ([place]) [A41.9] COVID-19 [U07.1]  [facility]/MWF/right IJ PermCath  End-stage renal disease on hemodialysis.  Will maintain outpatient schedule if possible.   Patient was last dialyzed yesterday Plans to dialyze patient later today due to COVID status.   No need for renal placement therapy today Plan is for patient to have next treatment scheduled for Monday.  2. Anemia of chronic kidney disease  Normocytic Lab Results  Component Value Date   HGB 9.3 (L) 08/13/2021  Mircera received outpatient Hemoglobin remains at goal  3. Secondary Hyperparathyroidism:  Lab Results  Component Value Date   CALCIUM 7.9 (L) 08/13/2021   PHOS 5.9 (H) 08/12/2021  We will continue to monitor bone mineral metabolism during this admission.   Phosphorus remains elevated yet acceptable.    4. 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 Home regimen includes Humalog and Levemir. Most recent hemoglobin A1c is 10.1 on 07/14/21.    Glucose remains elevated.  Primary team to manage SSI  5.  Hypertension with chronic kidney disease.  Home regimen includes furosemide, carvedilol, hydralazine, and isosorbide.  Currently receiving these medications.  Blood pressure is stable    LOS: 5 Perkins Molina s Jarquis Walker, NP 2/25/20238:24 AM

## 2021-08-13 NOTE — Evaluation (Signed)
Physical Therapy Evaluation Patient Details Name: Jimmy Olson MRN: [id number] DOB: [birth year] Today's Date: 08/13/2021  History of Present Illness  Pt is a 61 y/o M admitted on 08/08/21 after presenting to the hospital with c/o dry cough, congestion & SOB. Pt tested positive for Covid-19. Pt was recently hospitalized on 1/26-1/27/23 with TIA with MRA of neck showing ulcerated plaque proximal L ICA with 50-60% stenosis. PMH: insulin-dependent type 2 DM, chronic systolic & diastolic CHF, ESRD on HD, anemia of CKD, two-vessel CAD, depression, DVT of axillary vein on eliquis  Clinical Impression  Pt seen for PT evaluation with wife present & interpreter arriving to assist [facility]). Pt demonstrates significant BLE weakness during standing & step pivot transfers, requiring UE support. Provided pt with RW & pt able to ambulate increased distances in room with CGA. Reviewed need for assistance & to use RW upon return home, as well as assistance for stair negotiation. Provided pt & wife with cloth gait belt. Pt performs BLE strengthening exercises with cuing for technique. Will continue to follow pt acutely to address strength, balance, endurance & gait with LRAD.       Recommendations for follow up therapy are one component of a multi-disciplinary discharge planning process, led by the attending physician.  Recommendations may be updated based on patient status, additional functional criteria and insurance authorization.  Follow Up Recommendations Home health PT    Assistance Recommended at Discharge Intermittent Supervision/Assistance  Patient can return home with the following  A little help with walking and/or transfers;A little help with bathing/dressing/bathroom;Assistance with cooking/housework;Direct supervision/assist for financial management;Assist for transportation;Help with stairs or ramp for entrance    Equipment Recommendations Rolling walker (2 wheels)  Recommendations for  Other Services       Functional Status Assessment Patient has had a recent decline in their functional status and demonstrates the ability to make significant improvements in function in a reasonable and predictable amount of time.     Precautions / Restrictions Precautions Precautions: Fall Restrictions Weight Bearing Restrictions: No      Mobility  Bed Mobility Overal bed mobility: Modified Independent             General bed mobility comments: supine>sit with bed rails, HOB slightly elevated    Transfers Overall transfer level: Needs assistance Equipment used: None Transfers: Sit to/from Stand, Bed to chair/wheelchair/BSC Sit to Stand: Min assist   Step pivot transfers: Min assist       General transfer comment: BLE weakness noted, pt with wobbly BLE    Ambulation/Gait Ambulation/Gait assistance: Min guard Gait Distance (Feet):  (3 laps to door & back of room equaling ~90 ft) Assistive device: Rolling walker (2 wheels)   Gait velocity: decreased     General Gait Details: Education/cuing to ambulate within base of AD vs pushing RW too far out in front of him.  Stairs            Wheelchair Mobility    Modified Rankin (Stroke Patients Only)       Balance Overall balance assessment: Needs assistance Sitting-balance support: Feet supported, Bilateral upper extremity supported Sitting balance-Leahy Scale: Fair     Standing balance support: No upper extremity supported, During functional activity Standing balance-Leahy Scale: Poor                               Pertinent Vitals/Pain Pain Assessment Pain Assessment: No/denies pain    Home Living Family/patient  expects to be discharged to:: Private residence Living Arrangements: Spouse/significant other Available Help at Discharge: Family (wife works night shift, sleeps around 2pm each day) Type of Home: House Home Access: Stairs to enter Entrance Stairs-Rails: Right;Left;Can  reach both Technical brewer of Steps: 2-3   Home Layout: One [place]: None      Prior Function Prior Level of Function : Independent/Modified Independent             Mobility Comments: Pt reports he does not use AD for ambulation.       Hand Dominance        Extremity/Trunk Assessment   Upper Extremity Assessment Upper Extremity Assessment: Overall WFL for tasks assessed    Lower Extremity Assessment Lower Extremity Assessment: Generalized weakness    Cervical / Trunk Assessment Cervical / Trunk Assessment: Normal  Communication   Communication: Prefers language other than [place];Interpreter utilized Civil engineer, contracting utilized, Building services engineer)  Cognition Arousal/Alertness: Awake/alert Behavior During Therapy: WFL for tasks assessed/performed Overall Cognitive Status: Within Functional Limits for tasks assessed                                          General Comments General comments (skin integrity, edema, etc.): Pt on room air with SpO2 >90% despite pt c/o Slight SOB after gait.    Exercises Other Exercises Other Exercises: 5x sit<>stand without BUE support with focus on BLE strengthening with pt requiring mod assist Other Exercises: Pt performs mini squats with BUE support on RW & min assist for BLE strengthening.   Assessment/Plan    PT Assessment Patient needs continued PT services  PT Problem List Decreased strength;Decreased mobility;Decreased safety awareness;Decreased activity tolerance;Decreased balance;Cardiopulmonary status limiting activity;Decreased knowledge of use of DME       PT Treatment Interventions DME instruction;Therapeutic exercise;Gait training;Balance training;Stair training;Neuromuscular re-education;Functional mobility training;Patient/family education;Therapeutic activities    PT Goals (Current goals can be found in the Care Plan section)  Acute Rehab PT Goals Patient Stated Goal: get  better PT Goal Formulation: With patient Time For Goal Achievement: 08/27/21 Potential to Achieve Goals: Good    Frequency Min 2X/week     Co-evaluation               AM-PAC PT "6 Clicks" Mobility  Outcome Measure Help needed turning from your back to your side while in a flat bed without using bedrails?: None Help needed moving from lying on your back to sitting on the side of a flat bed without using bedrails?: A Little Help needed moving to and from a bed to a chair (including a wheelchair)?: A Little Help needed standing up from a chair using your arms (e.g., wheelchair or bedside chair)?: A Little Help needed to walk in hospital room?: A Little Help needed climbing 3-5 steps with a railing? : A Lot 6 Click Score: 18    End of Session Equipment Utilized During Treatment: Gait belt Activity Tolerance: Patient tolerated treatment well Patient left: in chair;with chair alarm set;with call bell/phone within reach;with family/visitor present Nurse Communication: Mobility status PT Visit Diagnosis: Muscle weakness (generalized) (M62.81);Unsteadiness on feet (R26.81)    Time: 7948-0165 PT Time Calculation (min) (ACUTE ONLY): 17 min   Charges:   PT Evaluation $PT Eval Low Complexity: 1 Low PT Treatments $Therapeutic Activity: 8-22 mins        Lavone Nian, PT, DPT 08/13/21, 12:28 PM  Waunita Schooner 08/13/2021, 12:26 PM

## 2021-08-14 DIAGNOSIS — I5043 Acute on chronic combined systolic (congestive) and diastolic (congestive) heart failure: Secondary | ICD-10-CM | POA: Diagnosis not present

## 2021-08-14 LAB — CBC
HCT: 27.6 % — ABNORMAL LOW (ref 39.0–52.0)
Hemoglobin: 9.3 g/dL — ABNORMAL LOW (ref 13.0–17.0)
MCH: 28.5 pg (ref 26.0–34.0)
MCHC: 33.7 g/dL (ref 30.0–36.0)
MCV: 84.7 fL (ref 80.0–100.0)
Platelets: 208 10*3/uL (ref 150–400)
RBC: 3.26 MIL/uL — ABNORMAL LOW (ref 4.22–5.81)
RDW: 12.3 % (ref 11.5–15.5)
WBC: 4.5 10*3/uL (ref 4.0–10.5)
nRBC: 0 % (ref 0.0–0.2)

## 2021-08-14 LAB — GLUCOSE, CAPILLARY
Glucose-Capillary: 126 mg/dL — ABNORMAL HIGH (ref 70–99)
Glucose-Capillary: 270 mg/dL — ABNORMAL HIGH (ref 70–99)

## 2021-08-14 MED ORDER — PHENYLEPHRINE HCL 0.5 % NA SOLN
1.0000 [drp] | Freq: Once | NASAL | Status: AC
Start: 2021-08-14 — End: 2021-08-14
  Administered 2021-08-14: 1 [drp] via NASAL
  Filled 2021-08-14: qty 15

## 2021-08-14 MED ORDER — GUAIFENESIN-DM 100-10 MG/5ML PO SYRP: 10.0000 mL | ORAL_SOLUTION | ORAL | 0 refills | Status: DC | PRN

## 2021-08-14 MED ORDER — ASPIRIN 81 MG PO TBEC: 81.0000 mg | DELAYED_RELEASE_TABLET | Freq: Every day | ORAL | 1 refills | Status: DC

## 2021-08-14 MED ORDER — ATORVASTATIN CALCIUM 80 MG PO TABS: 80.0000 mg | ORAL_TABLET | Freq: Every day | ORAL | 1 refills | Status: DC

## 2021-08-14 NOTE — TOC Transition Note (Addendum)
Transition of Care [facility]) - CM/SW Discharge Note   Patient Details  Name: Jimmy Olson MRN: [id number] Date of Birth: 1960/11/01  [facility] of [facility]) CM/SW Contact:  Magnus Ivan, LCSW Phone Number: 08/14/2021, 1:40 PM   Clinical Narrative:   Patient to DC home today.  Bedside RN Elana Alm has spoken to spouse who feels comfortable doing PT exercises with patient and declines Home Health at this time.  Patient needs RW delivered to bedside prior to DC. CSW made referral to [facility] with [employer] for RW to be delivered to bedside today before DC.     Final next level of care: Home/Self Care Barriers to Discharge: Barriers Resolved   Patient Goals and CMS Choice Patient states their goals for this hospitalization and ongoing recovery are:: home with spouse CMS Medicare.gov Compare Post Acute Care list provided to:: Patient Choice offered to / list presented to : Patient, Spouse  Discharge Placement                       Discharge Plan and Services                DME Arranged: Walker rolling DME Agency: [employer] Date DME Agency Contacted: 08/14/21   Representative spoke with at DME Agency: [place]) Interventions     Readmission Risk Interventions Readmission Risk Prevention Plan 08/08/2021 06/01/2021  Transportation Screening Complete Complete  PCP or Specialist Appt within 3-5 Days Complete Complete  HRI or [place] Complete Complete  Social Work Consult for [place] Planning/Counseling Complete Complete  Palliative Care Screening Not Applicable Not Applicable  Medication Review Press photographer) Complete Referral to Pharmacy  Some recent data might be hidden

## 2021-08-14 NOTE — Progress Notes (Signed)
Patient had a nose bleed last night, on-call notified new order given (see MAR), bleeding stopped after treatment. We continue to monitor

## 2021-08-14 NOTE — Discharge Summary (Addendum)
Physician Discharge Summary   Patient: Jimmy Olson MRN: [id number] DOB: 1960/06/22  Admit date:     08/08/2021  Discharge date: 08/29/21  Discharge Physician: Ezekiel Slocumb   PCP: [facility]   Recommendations at discharge:    Follow up with nephrology and attend usual scheduled dialysis sessions starting tomorrow Follow up with PCP in 1-2 weeks Recheck BMP/CBC in 1-2 weeks Follow up with vascular surgery in March as scheduled    Discharge Diagnoses: Principal Problem:   Acute on chronic combined systolic and diastolic CHF (congestive heart failure) ([place]) Active Problems:   Acute respiratory failure with hypoxia ([place])   Hypertensive emergency   Uncontrolled type 2 diabetes mellitus with hyperglycemia, with long-term current use of insulin ([place])   ESRD on hemodialysis ([place])   NSTEMI (non-ST elevated myocardial infarction) ([place])   History of DVT (deep vein thrombosis)   Chronic anticoagulation   COVID-19 virus infection   Anemia of chronic kidney failure, stage 5 (HCC)   Generalized weakness   Sepsis ([place]) NSTEMI type II - due to demand ischemia   Hospital Course: Jimmy Olson is a 61 y.o. male with medical history significant for insulin-dependent type 2 diabetes, chronic systolic and diastolic CHF (EF 45 to 10%, G1DD 07/15/21), ESRD initiated on HD 05/2021, anemia of CKD,  two-vessel CAD on [facility] 06/2020, depression, DVT of axillary vein 05/2021 on Eliquis, recently hospitalized from 1/26 to 1/27 with a TIA, with MRA neck showing ulcerated plaque proximal left ICA with 50 to 60% stenosis.  He presented to the hospital because of dry cough, nasal congestion and shortness of breath.  He is vaccinated against influenza but not COVID.  2/25: pt remains profoundly weak and fatigued   2/26: pt doing better, tolerance for ambulating improved sufficiently to manage at home and get to outpatient dialysis.  Clinically improved and medically stable for  discharge home.  Patient and wife in agreement.  Next dialysis is Monday.  Assessment and Plan: * Acute on chronic combined systolic and diastolic CHF (congestive heart failure) ([place]) Secondary to hypertensive emergency, possible primary ACS.  No missed dialysis sessions Treated with IV Lasix, now on home PO Lasix PRN Continue home carvedilol, hydralazine, Imdur Daily weights with intake and output monitoring EF 45 to 50% with G1 DD on 07/15/2021  Acute respiratory failure with hypoxia (HCC) Resolved.  Suspect mostly related to CHF than to COVID.  Presented with shortness of breath O2 sat 87% on room air, tachypnea with increased work of breathing. Supplemental O2 to keep sats over 94% Completed remdesivir for COVID  Sepsis ([place]) POA with fever and tachycardia in setting of Covid-19 pneumonia.  Sepsis physiology resolved.  Generalized weakness Due to COVID infection and chronic comorbidities. PT and OT evaluations.  They declined [place] therapy at discharge. Mobilize as tolerated, as much as possible. Optimize nutrition.  Anemia of chronic kidney failure, stage 5 (HCC) Hemoglobin stable and at baseline.  Continue to monitor   COVID-19 virus infection Was negative for COVID on 07/14/2021 so recent infection suspected Symptomatic for cough and shortness of breath with nasal congestion but no fever.  Chest x-ray more consistent with CHF Completed remdesivir Antitussives, albuterol Steroids were deferred as it was felt hypoxia was mild and believed mostly related to CHF rather than COVID-pneumonia.  Now on room air.  Chronic anticoagulation As above.  Eliquis resumed.  History of DVT (deep vein thrombosis) Axillary DVT in December 2022 Continue Eliquis   NSTEMI (non-ST elevated  myocardial infarction) [facility]) Patient is denying chest pain and EKG nonacute but with troponin [phone], Could be related to demand ischemia versus type I NSTEMI Initially placed on heparin  infusion. Continue rosuvastatin, isosorbide Two-vessel CAD on [facility] 06/2020 Cardiology consulted. Continue ASA, Lipitor, Coreg, Imdur Resumed Eliquis No further cardiac testing or intervention at this time.  Cardiology signed off.  Outpatient follow up.  ESRD on hemodialysis [facility]) Initiated on hemodialysis in December 2022 Nephrology consult for continuation of dialysis  Demand ischemia [facility]) Due to hypoxia and ESRD, CHF. No chest pain or acute ischemic ECG changes.  Uncontrolled type 2 diabetes mellitus with hyperglycemia, with long-term current use of insulin (HCC) Blood sugar over 500, given IV insulin 10 units in the ED.   Sugars have improved. Continue basal Levemir Sliding scale insulin  Hypertensive emergency BP 208/106 on arrival BP improved on home regimen and with dialysis and volume removal. On carvedilol, hydralazine, Imdur           Consultants: Nephrology, Cardiology Procedures performed: Dialysis Disposition: Home Diet recommendation:  Renal diet  DISCHARGE MEDICATION: Allergies as of 08/14/2021   No Known Allergies      Medication List     STOP taking these medications    D3-50 1.25 MG (50000 UT) capsule Generic drug: Cholecalciferol   rosuvastatin 40 MG tablet Commonly known as: CRESTOR       TAKE these medications    albuterol 108 (90 Base) MCG/ACT inhaler Commonly known as: Proventil HFA INHALE 2 PULSACIONES EN LOS PULMONES UNA VEZ CADA [place]. (INHALE 2 PUFFS INTO THE LUNGS ONCE EVERY 6 HOURS AS NEEDED FOR WHEEZING OR SHORTNESS OF BREATH.)   carvedilol 25 MG tablet Commonly known as: COREG Tome 1 tableta (25 mg en total) por va oral 2 (dos) veces al [employer]. (Take 1 tablet (25 mg total) by mouth 2 (two) times daily.)   Comfort EZ Pen Needles 32G X 4 MM Misc Generic drug: Insulin Pen Needle Use como se indica. (USE AS DIRECTED)   Eliquis 5 MG Tabs tablet Generic drug:  apixaban TOME 1 TABLETA POR BOCA DOS VECES AL DA. (TAKE 1 TABLET BY MOUTH TWICE DAILY.)   furosemide 80 MG tablet Commonly known as: LASIX Take 80 mg daily as needed on nondialysis days for swelling or shortness of breath   HumaLOG KwikPen 100 UNIT/ML KwikPen Generic drug: insulin lispro Inject 3 Units into the skin 3 (three) times daily.   hydrALAZINE 100 MG tablet Commonly known as: APRESOLINE Tome 1 tableta (100 mg en total) por va oral una vez cada 8 (ocho) horas. (Take 1 tablet (100 mg total) by mouth once every 8 (eight) hours.)   insulin detemir 100 UNIT/ML FlexPen Commonly known as: Levemir FlexTouch Inyecte 20 Unidades en la piel una vez al da a la hora de [place]. (Inject 20 Units into the skin once daily at bedtime.)   isosorbide mononitrate 30 MG 24 hr tablet Commonly known as: IMDUR Tome 1 tableta (30 mg en total) por va oral por la maana y al acostarse. (Take 1 tablet (30 mg total) by mouth in the morning and at bedtime.)   nitroGLYCERIN 0.4 MG SL tablet Commonly known as: NITROSTAT Place 1 tablet (0.4 mg total) under the tongue every 5 (five) minutes as needed for chest pain.         Discharge Exam: Filed Weights   08/12/21 1956 08/13/21 0530 08/14/21 0424  Weight: 63.3 kg 62.8 kg  62.9 kg   General exam: awake, alert, no acute distress, appears fatigued  HEENT: atraumatic, clear conjunctiva, anicteric sclera, moist mucus membranes, hearing grossly normal  Respiratory system: CTAB diminished bases, no wheezes, rales or rhonchi, normal respiratory effort. Cardiovascular system: normal S1/S2, RRR, R IJ HD cath in place, no pedal edema.   Central nervous system: o gross focal neurologic deficits, normal speech Extremities: moves all , no edema, normal tone Skin: dry, intact, normal temperature Psychiatry: normal mood, congruent affect, judgement and insight appear normal   Condition at discharge: stable  The results of significant diagnostics  from this hospitalization (including imaging, microbiology, ancillary and laboratory) are listed below for reference.   Imaging Studies: DG Chest 1 View  Result Date: 08/08/2021 CLINICAL DATA:  Cough and fevers EXAM: PORTABLE CHEST 1 VIEW COMPARISON:  05/31/2021 FINDINGS: Cardiac shadow is enlarged but stable. Dialysis catheter is noted in satisfactory position. Mild vascular congestion is again noted the related to fluid overload. Interstitial edema is noted. No bony abnormality is seen. IMPRESSION: CHF with interstitial edema stable from the prior exam Electronically Signed   By: Inez Catalina M.D.   On: 08/08/2021 00:30   VAS US CAROTID  Result Date: 08/18/2021 Carotid Arterial Duplex Study Patient Name:  Jimmy Olson  Date of Exam:   08/18/2021 Medical Rec #: [id number]      Accession #:    [id number] Date of Birth: Jun 03, 1961      Patient Gender: M Patient Age:   29 years Exam Location:  [place] Procedure:      VAS US CAROTID Referring Phys: Ida Rogue --------------------------------------------------------------------------------  Indications:       TIA. Risk Factors:      Hypertension, Diabetes, no history of smoking, prior MI,                    coronary artery disease. Other Factors:     Patient became very dizzy when he sat up after this exam.                    This is apparently very common and he needed to sit for 5-8                    minutes for the symptom to subside. Comparison Study:  No previous on record Performing Technologist: Pilar Jarvis RDMS, RVT, RDCS  Examination Guidelines: A complete evaluation includes B-mode imaging, spectral Doppler, color Doppler, and power Doppler as needed of all accessible portions of each vessel. Bilateral testing is considered an integral part of a complete examination. Limited examinations for reoccurring indications may be performed as noted.  Right Carotid Findings: +----------+--------+--------+---------------------+------------------+--------+              PSV cm/s EDV cm/s Stenosis              Plaque Description Comments  +----------+--------+--------+---------------------+------------------+--------+  CCA Prox   70       10                                                          +----------+--------+--------+---------------------+------------------+--------+  CCA Distal 83       18       No plaque visualized.                              +----------+--------+--------+---------------------+------------------+--------+  ICA Prox   38       9        Normal                                             +----------+--------+--------+---------------------+------------------+--------+  ICA Mid    75       25       Normal                                             +----------+--------+--------+---------------------+------------------+--------+  ICA Distal 57       20                                                          +----------+--------+--------+---------------------+------------------+--------+  ECA        109      11                                                          +----------+--------+--------+---------------------+------------------+--------+ +----------+--------+-------+----------------+-------------------+             PSV cm/s EDV cms Describe         Arm Pressure (mmHG)  +----------+--------+-------+----------------+-------------------+  Subclavian 123              Multiphasic, WNL 155                  +----------+--------+-------+----------------+-------------------+ +---------+--------+--+--------+--+---------+  Vertebral PSV cm/s 58 EDV cm/s 14 Antegrade  +---------+--------+--+--------+--+---------+  Left Carotid Findings: +----------+--------+--------+--------+------------------+---------------------+             PSV cm/s EDV cm/s Stenosis Plaque Description Comments               +----------+--------+--------+--------+------------------+---------------------+  CCA Prox   91       17                                                           +----------+--------+--------+--------+------------------+---------------------+  CCA Distal 86       20       <50%                                               +----------+--------+--------+--------+------------------+---------------------+  ICA Prox   188      58       40-59%   focal, smooth and  calcific                                  +----------+--------+--------+--------+------------------+---------------------+  ICA Mid    235      69       60-79%                      Presence of plaque                                                               could not be assessed  +----------+--------+--------+--------+------------------+---------------------+  ICA Distal 159      44                                                          +----------+--------+--------+--------+------------------+---------------------+  ECA        97                                                                   +----------+--------+--------+--------+------------------+---------------------+ +----------+--------+--------+----------------+-------------------+             PSV cm/s EDV cm/s Describe         Arm Pressure (mmHG)  +----------+--------+--------+----------------+-------------------+  Subclavian 128               Multiphasic, WNL 155                  +----------+--------+--------+----------------+-------------------+ +---------+--------+--+--------+--+---------+  Vertebral PSV cm/s 58 EDV cm/s 14 Antegrade  +---------+--------+--+--------+--+---------+   Summary: Right Carotid: There is no evidence of stenosis in the right ICA.                Non-hemodynamically significant plaque <50% noted in the CCA. The                ECA appears <50% stenosed. Left Carotid: Velocities in the left ICA are consistent with a 60-79% stenosis.               Non-hemodynamically significant plaque <50% noted in the CCA. The               ECA appears <50% stenosed. Vertebrals:   Bilateral vertebral arteries demonstrate antegrade flow. Subclavians: Normal flow hemodynamics were seen in bilateral subclavian              arteries. *See table(s) above for measurements and observations. Suggest follow up study in 12 months. Electronically signed by Quay Burow MD on 08/18/2021 at 4:46:13 PM.    Final    ECHOCARDIOGRAM LIMITED  Result Date: 08/08/2021    ECHOCARDIOGRAM LIMITED REPORT   Patient Name:   Jimmy Olson Date of Exam: 08/08/2021 Medical Rec #:  [id number]     Height:       66.0 in Accession #:    [id number]  Weight:       150.4 lb Date of Birth:  [birth year]     BSA:          1.771 m Patient Age:    38 years      BP:           152/87 mmHg Patient Gender: M             HR:           84 bpm. Exam Location:  [facility] Procedure: Limited Echo, Limited Color Doppler, Cardiac Doppler and Strain            Analysis Indications:     I21.4 NSTEMI  History:         Patient has prior history of Echocardiogram examinations, most                  recent 07/15/2021. HFrEF, CKD, stage IV; Risk                  Factors:Hypertension and Diabetes. Pt tested positive for                  COVID-19 on 08/08/21.  Sonographer:     Charmayne Sheer Referring Phys:  [id number] Athena Masse Diagnosing Phys: [name] MD  Sonographer Comments: Global longitudinal strain was attempted. IMPRESSIONS  1. Left ventricular ejection fraction, by estimation, is 40 to 45%. The left ventricle has mildly decreased function. The left ventricle demonstrates global hypokinesis. The left ventricular internal cavity size was mildly dilated. There is mild left ventricular hypertrophy. Left ventricular diastolic function could not be evaluated. The average left ventricular global longitudinal strain is -8.6 %. The global longitudinal strain is abnormal.  2. Right ventricular systolic function is normal. The right ventricular size is normal. Tricuspid regurgitation signal is inadequate for assessing PA pressure.  3. Left atrial size  was mildly dilated.  4. The mitral valve is normal in structure. No evidence of mitral valve regurgitation. No evidence of mitral stenosis.  5. The aortic valve is normal in structure. Aortic valve regurgitation is not visualized. Aortic valve sclerosis is present, with no evidence of aortic valve stenosis.  6. The inferior vena cava is normal in size with greater than 50% respiratory variability, suggesting right atrial pressure of 3 mmHg. FINDINGS  Left Ventricle: Left ventricular ejection fraction, by estimation, is 40 to 45%. The left ventricle has mildly decreased function. The left ventricle demonstrates global hypokinesis. The average left ventricular global longitudinal strain is -8.6 %. The  global longitudinal strain is abnormal. The left ventricular internal cavity size was mildly dilated. There is mild left ventricular hypertrophy. Left ventricular diastolic function could not be evaluated. Right Ventricle: The right ventricular size is normal. No increase in right ventricular wall thickness. Right ventricular systolic function is normal. Tricuspid regurgitation signal is inadequate for assessing PA pressure. Left Atrium: Left atrial size was mildly dilated. Right Atrium: Right atrial size was normal in size. Pericardium: There is no evidence of pericardial effusion. Mitral Valve: The mitral valve is normal in structure. Mild mitral annular calcification. No evidence of mitral valve stenosis. Tricuspid Valve: The tricuspid valve is normal in structure. Tricuspid valve regurgitation is not demonstrated. No evidence of tricuspid stenosis. Aortic Valve: The aortic valve is normal in structure. Aortic valve regurgitation is not visualized. Aortic valve sclerosis is present, with no evidence of aortic valve stenosis. Pulmonic Valve: The pulmonic valve was normal in structure. Pulmonic valve regurgitation is not visualized.  No evidence of pulmonic stenosis. Aorta: The aortic root is normal in size and  structure. Venous: The inferior vena cava is normal in size with greater than 50% respiratory variability, suggesting right atrial pressure of 3 mmHg. IAS/Shunts: No atrial level shunt detected by color flow Doppler. LEFT VENTRICLE PLAX 2D LVIDd:         5.26 cm LVIDs:         4.13 cm 2D Longitudinal Strain LV PW:         1.38 cm 2D Strain GLS Avg:     -8.6 % LV IVS:        1.05 cm  LEFT ATRIUM         Index LA diam:    4.40 cm 2.48 cm/m [name] MD Electronically signed by [name] MD Signature Date/Time: 08/08/2021/10:23:08 AM    Final     Microbiology: Results for orders placed or performed during the hospital encounter of 08/08/21  Culture, blood (Routine x 2)     Status: None   Collection Time: 08/08/21 12:04 AM   Specimen: Left Antecubital; Blood  Result Value Ref Range Status   Specimen Description LEFT ANTECUBITAL  Final   Special Requests   Final    BOTTLES DRAWN AEROBIC AND ANAEROBIC Blood Culture results may not be optimal due to an excessive volume of blood received in culture bottles   Culture   Final    NO GROWTH 5 DAYS Performed at [facility], [address] [place], [place] [address]    Report Status 08/13/2021 FINAL  Final  Resp Panel by RT-PCR (Flu A&B, Covid) Nasopharyngeal Swab     Status: Abnormal   Collection Time: 08/08/21 12:13 AM   Specimen: Nasopharyngeal Swab; Nasopharyngeal(NP) swabs in vial transport medium  Result Value Ref Range Status   SARS Coronavirus 2 by RT PCR POSITIVE (A) NEGATIVE Final    Comment: (NOTE) SARS-CoV-2 target nucleic acids are DETECTED.  The SARS-CoV-2 RNA is generally detectable in upper respiratory specimens during the acute phase of infection. Positive results are indicative of the presence of the identified virus, but do not rule out bacterial infection or co-infection with other pathogens not detected by the test. Clinical correlation with patient history and other diagnostic information is necessary to  determine patient infection status. The expected result is Negative.  Fact Sheet for Patients: EntrepreneurPulse.com.au  Fact Sheet for Healthcare Providers: IncredibleEmployment.be  This test is not yet approved or cleared by the [place] FDA and  has been authorized for detection and/or diagnosis of SARS-CoV-2 by FDA under an Emergency Use Authorization (EUA).  This EUA will remain in effect (meaning this test can be used) for the duration of  the COVID-19 declaration under Section 564(b)(1) of the A ct, 21 U.S.C. section 360bbb-3(b)(1), unless the authorization is terminated or revoked sooner.     Influenza A by PCR NEGATIVE NEGATIVE Final   Influenza B by PCR NEGATIVE NEGATIVE Final    Comment: (NOTE) The Xpert Xpress SARS-CoV-2/FLU/RSV plus assay is intended as an aid in the diagnosis of influenza from Nasopharyngeal swab specimens and should not be used as a sole basis for treatment. Nasal washings and aspirates are unacceptable for Xpert Xpress SARS-CoV-2/FLU/RSV testing.  Fact Sheet for Patients: EntrepreneurPulse.com.au  Fact Sheet for Healthcare Providers: IncredibleEmployment.be  This test is not yet approved or cleared by the [place] FDA and has been authorized for detection and/or diagnosis of SARS-CoV-2 by FDA under an Emergency Use Authorization (EUA). This  EUA will remain in effect (meaning this test can be used) for the duration of the COVID-19 declaration under Section 564(b)(1) of the Act, 21 U.S.C. section 360bbb-3(b)(1), unless the authorization is terminated or revoked.  Performed at [facility], [address] [place], [place] [address]   Culture, blood (Routine x 2)     Status: None   Collection Time: 08/08/21  1:50 AM   Specimen: BLOOD  Result Value Ref Range Status   Specimen Description BLOOD LEFT  Final   Special Requests   Final    BOTTLES DRAWN  AEROBIC AND ANAEROBIC Blood Culture adequate volume   Culture   Final    NO GROWTH 5 DAYS Performed at [facility], [address] [place], [place] [address]    Report Status 08/13/2021 FINAL  Final    Labs: CBC: No results for input(s): WBC, NEUTROABS, HGB, HCT, MCV, PLT in the last 168 hours.  Basic Metabolic Panel: No results for input(s): NA, K, CL, CO2, GLUCOSE, BUN, CREATININE, CALCIUM, MG, PHOS in the last 168 hours.  Liver Function Tests: No results for input(s): AST, ALT, ALKPHOS, BILITOT, PROT, ALBUMIN in the last 168 hours.  CBG: No results for input(s): GLUCAP in the last 168 hours.   Discharge time spent: less than 30 minutes.  Signed: Ezekiel Slocumb, DO [facility] 08/29/2021

## 2021-08-14 NOTE — Plan of Care (Signed)
°  Problem: Health Behavior/Discharge Planning: Goal: Ability to manage health-related needs will improve 08/14/2021 1141 by Orvan Seen, RN Outcome: Progressing 08/14/2021 1141 by Orvan Seen, RN Outcome: Progressing   Problem: Clinical Measurements: Goal: Ability to maintain clinical measurements within normal limits will improve 08/14/2021 1141 by Orvan Seen, RN Outcome: Progressing 08/14/2021 1141 by Orvan Seen, RN Outcome: Progressing Goal: Will remain free from infection 08/14/2021 1141 by Orvan Seen, RN Outcome: Progressing 08/14/2021 1141 by Orvan Seen, RN Outcome: Progressing Goal: Diagnostic test results will improve 08/14/2021 1141 by Orvan Seen, RN Outcome: Progressing 08/14/2021 1141 by Orvan Seen, RN Outcome: Progressing Goal: Respiratory complications will improve 08/14/2021 1141 by Orvan Seen, RN Outcome: Progressing 08/14/2021 1141 by Orvan Seen, RN Outcome: Progressing Goal: Cardiovascular complication will be avoided 08/14/2021 1141 by Orvan Seen, RN Outcome: Progressing 08/14/2021 1141 by Orvan Seen, RN Outcome: Progressing   Problem: Activity: Goal: Risk for activity intolerance will decrease 08/14/2021 1141 by Orvan Seen, RN Outcome: Progressing 08/14/2021 1141 by Orvan Seen, RN Outcome: Progressing   Problem: Nutrition: Goal: Adequate nutrition will be maintained 08/14/2021 1141 by Orvan Seen, RN Outcome: Progressing 08/14/2021 1141 by Orvan Seen, RN Outcome: Progressing   Problem: Coping: Goal: Level of anxiety will decrease 08/14/2021 1141 by Orvan Seen, RN Outcome: Progressing 08/14/2021 1141 by Orvan Seen, RN Outcome: Progressing   Problem: Education: Goal: Knowledge of risk factors and measures for prevention of condition will improve 08/14/2021 1141 by Orvan Seen, RN Outcome: Progressing 08/14/2021 1141 by Orvan Seen, RN Outcome:  Progressing   Problem: Coping: Goal: Psychosocial and spiritual needs will be supported 08/14/2021 1141 by Orvan Seen, RN Outcome: Progressing 08/14/2021 1141 by Orvan Seen, RN Outcome: Progressing   Problem: Respiratory: Goal: Will maintain a patent airway 08/14/2021 1141 by Orvan Seen, RN Outcome: Progressing 08/14/2021 1141 by Orvan Seen, RN Outcome: Progressing Goal: Complications related to the disease process, condition or treatment will be avoided or minimized 08/14/2021 1141 by Orvan Seen, RN Outcome: Progressing 08/14/2021 1141 by Orvan Seen, RN Outcome: Progressing

## 2021-08-14 NOTE — Progress Notes (Signed)
Patient discharged to home accompanied by wife with all belongings including Rolling Walker. A+Ox4. VSS.  Medications and discharge instructions reviewed with patient and wife.  All questions answered.  PIV x2 removed, no bleeding, intact. Care RN is Lexicographer. Patient satisfied with overall care at [facility].

## 2021-08-14 NOTE — Progress Notes (Signed)
[facility]  ROUNDING NOTE   Subjective:   Jimmy Olson is a 60 year old Hispanic male with past medical conditions including anemia, two-vessel CAD, diabetes, depression, DVT with Eliquis, and end-stage renal disease on hemodialysis.  Patient presents to the emergency department with complaints of shortness of breath, cough and nasal congestion.  Patient was admitted for Hyperglycemia [R73.9] NSTEMI (non-ST elevated myocardial infarction) ([place]) [I21.4] Acute respiratory failure with hypoxia (HCC) [J96.01] Acute on chronic congestive heart failure, unspecified heart failure type ([place]) [I50.9] Acute sepsis ([place]) [A41.9] COVID-19 [U07.1]  Patient is known to our practice and receives outpatient dialysis treatments at [facility] on a MWF schedule, supervised by Dr. Candiss Norse.    Patient resting comfortably Patient offers no new specific physical complaint.  Patient states he is feeling better than before  Objective:  Vital signs in last 24 hours:  Temp:  [97.7 F (36.5 C)-98.4 F (36.9 C)] 97.8 F (36.6 C) (02/26 0835) Pulse Rate:  [73-81] 75 (02/26 0835) Resp:  [16] 16 (02/26 0835) BP: (121-187)/(70-95) 158/80 (02/26 0835) SpO2:  [97 %-100 %] 100 % (02/26 0835) Weight:  [62.9 kg] 62.9 kg (02/26 0424)  Weight change: -0.832 kg Filed Weights   08/12/21 1956 08/13/21 0530 08/14/21 0424  Weight: 63.3 kg 62.8 kg 62.9 kg    Intake/Output: I/O last 3 completed shifts: In: 120 [P.O.:120] Out: 499 [Urine:500]   Intake/Output this shift:  No intake/output data recorded.  Physical Exam: General: NAD  Head: Normocephalic, atraumatic. Moist oral mucosal membranes  Eyes: Anicteric  Lungs:  Clear bilaterally, normal effort  Heart: Regular rate and rhythm  Abdomen:  Soft, nontender  Extremities: Trace peripheral edema.  Neurologic: Nonfocal, moving all four extremities  Skin: No lesions  Access: Right IJ PermCath    Basic Metabolic Panel: Recent Labs   Lab 08/09/21 0547 08/10/21 0618 08/11/21 0619 08/12/21 0647 08/13/21 0358  NA 134* 134* 135 135 132*  K 3.6 3.6 3.6 3.9 3.9  CL 97* 97* 97* 97* 91*  CO2 26 27 29 27 29   GLUCOSE 136* 99 131* 120* 351*  BUN 25* 42* 29* 51* 30*  CREATININE 3.33* 4.78* 3.53* 5.29* 3.62*  CALCIUM 8.6* 8.4* 8.4* 8.2* 7.9*  MG  --   --   --  2.2  --   PHOS  --   --   --  5.9*  --     Liver Function Tests: Recent Labs  Lab 08/08/21 0004  AST 27  ALT 25  ALKPHOS 131*  BILITOT 1.0  PROT 7.4  ALBUMIN 3.8   No results for input(s): LIPASE, AMYLASE in the last 168 hours. No results for input(s): AMMONIA in the last 168 hours.  CBC: Recent Labs  Lab 08/08/21 0004 08/09/21 0547 08/10/21 0618 08/11/21 0619 08/12/21 0647 08/13/21 0358 08/14/21 0637  WBC 7.3   < > 3.6* 2.9* 3.3* 3.9* 4.5  NEUTROABS 6.0  --   --   --   --   --   --   HGB 9.4*   < > 10.0* 10.2* 10.5* 9.3* 9.3*  HCT 28.9*   < > 30.1* 30.3* 30.6* 27.7* 27.6*  MCV 89.2   < > 86.5 86.3 86.2 85.2 84.7  PLT 223   < > 179 183 190 187 208   < > = values in this interval not displayed.    Cardiac Enzymes: No results for input(s): CKTOTAL, CKMB, CKMBINDEX, TROPONINI in the last 168 hours.  BNP: Invalid input(s): POCBNP  CBG:  Recent Labs  Lab 08/13/21 0848 08/13/21 1139 08/13/21 1707 08/13/21 2104 08/14/21 0833  GLUCAP 210* 181* 243* 196* 126*    Microbiology: Results for orders placed or performed during the hospital encounter of 08/08/21  Culture, blood (Routine x 2)     Status: None   Collection Time: 08/08/21 12:04 AM   Specimen: Left Antecubital; Blood  Result Value Ref Range Status   Specimen Description LEFT ANTECUBITAL  Final   Special Requests   Final    BOTTLES DRAWN AEROBIC AND ANAEROBIC Blood Culture results may not be optimal due to an excessive volume of blood received in culture bottles   Culture   Final    NO GROWTH 5 DAYS Performed at [facility], [address] [place], [place]  [id number]    Report Status 08/13/2021 FINAL  Final  Resp Panel by RT-PCR (Flu A&B, Covid) Nasopharyngeal Swab     Status: Abnormal   Collection Time: 08/08/21 12:13 AM   Specimen: Nasopharyngeal Swab; Nasopharyngeal(NP) swabs in vial transport medium  Result Value Ref Range Status   SARS Coronavirus 2 by RT PCR POSITIVE (A) NEGATIVE Final    Comment: (NOTE) SARS-CoV-2 target nucleic acids are DETECTED.  The SARS-CoV-2 RNA is generally detectable in upper respiratory specimens during the acute phase of infection. Positive results are indicative of the presence of the identified virus, but do not rule out bacterial infection or co-infection with other pathogens not detected by the test. Clinical correlation with patient history and other diagnostic information is necessary to determine patient infection status. The expected result is Negative.  Fact Sheet for Patients: EntrepreneurPulse.com.au  Fact Sheet for Healthcare Providers: IncredibleEmployment.be  This test is not yet approved or cleared by the [place] FDA and  has been authorized for detection and/or diagnosis of SARS-CoV-2 by FDA under an Emergency Use Authorization (EUA).  This EUA will remain in effect (meaning this test can be used) for the duration of  the COVID-19 declaration under Section 564(b)(1) of the A ct, 21 U.S.C. section 360bbb-3(b)(1), unless the authorization is terminated or revoked sooner.     Influenza A by PCR NEGATIVE NEGATIVE Final   Influenza B by PCR NEGATIVE NEGATIVE Final    Comment: (NOTE) The Xpert Xpress SARS-CoV-2/FLU/RSV plus assay is intended as an aid in the diagnosis of influenza from Nasopharyngeal swab specimens and should not be used as a sole basis for treatment. Nasal washings and aspirates are unacceptable for Xpert Xpress SARS-CoV-2/FLU/RSV testing.  Fact Sheet for Patients: EntrepreneurPulse.com.au  Fact Sheet for  Healthcare Providers: IncredibleEmployment.be  This test is not yet approved or cleared by the [place] FDA and has been authorized for detection and/or diagnosis of SARS-CoV-2 by FDA under an Emergency Use Authorization (EUA). This EUA will remain in effect (meaning this test can be used) for the duration of the COVID-19 declaration under Section 564(b)(1) of the Act, 21 U.S.C. section 360bbb-3(b)(1), unless the authorization is terminated or revoked.  Performed at [facility], [address] [place], [place] [address]   Culture, blood (Routine x 2)     Status: None   Collection Time: 08/08/21  1:50 AM   Specimen: BLOOD  Result Value Ref Range Status   Specimen Description BLOOD LEFT  Final   Special Requests   Final    BOTTLES DRAWN AEROBIC AND ANAEROBIC Blood Culture adequate volume   Culture   Final    NO GROWTH 5 DAYS Performed at [facility], [address] [place], [place] [address]    Report Status 08/13/2021 FINAL  Final    Coagulation Studies: No results for input(s): LABPROT, INR in the last 72 hours.   Urinalysis: No results for input(s): COLORURINE, LABSPEC, PHURINE, GLUCOSEU, HGBUR, BILIRUBINUR, KETONESUR, PROTEINUR, UROBILINOGEN, NITRITE, LEUKOCYTESUR in the last 72 hours.  Invalid input(s): APPERANCEUR    Imaging: No results found.   Medications:      albuterol  2 puff Inhalation Q6H   apixaban  5 mg Oral BID   aspirin EC  81 mg Oral Daily   atorvastatin  80 mg Oral Daily   carvedilol  25 mg Oral BID WC   Chlorhexidine Gluconate Cloth  6 each Topical Q0600   hydrALAZINE  100 mg Oral Q8H   insulin aspart  0-9 Units Subcutaneous TID WC   insulin detemir  8 Units Subcutaneous QHS   isosorbide mononitrate  30 mg Oral Daily   acetaminophen, chlorpheniramine-HYDROcodone, furosemide, guaiFENesin-dextromethorphan, nitroGLYCERIN, ondansetron (ZOFRAN) IV  Assessment/ Plan:  Jimmy Olson is a 61 y.o.   male with past medical conditions including anemia, two-vessel CAD, diabetes, depression, DVT with Eliquis, and end-stage renal disease on hemodialysis.  Patient presents to the emergency department with complaints of shortness of breath, cough and nasal congestion.  Patient was admitted for Hyperglycemia [R73.9] NSTEMI (non-ST elevated myocardial infarction) ([place]) [I21.4] Acute respiratory failure with hypoxia (HCC) [J96.01] Acute on chronic congestive heart failure, unspecified heart failure type ([place]) [I50.9] Acute sepsis ([place]) [A41.9] COVID-19 [U07.1]  [facility]/MWF/right IJ PermCath  End-stage renal disease on hemodialysis.  Will maintain outpatient schedule if possible.   Patient was last dialyzed yesterday  Patient was last dialyzed on Friday No need for renal placement therapy today Plan is for patient to have next treatment scheduled for Monday.  2. Anemia of chronic kidney disease  Normocytic Lab Results  Component Value Date   HGB 9.3 (L) 08/14/2021  Mircera received outpatient Hemoglobin remains at goal  3. Secondary Hyperparathyroidism:  Lab Results  Component Value Date   CALCIUM 7.9 (L) 08/13/2021   PHOS 5.9 (H) 08/12/2021  We will continue to monitor bone mineral metabolism during this admission.   Phosphorus remains elevated yet acceptable.    4. 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 Home regimen includes Humalog and Levemir. Most recent hemoglobin A1c is 10.1 on 07/14/21.    Glucose remains elevated.  Primary team to manage SSI  5.  Hypertension with chronic kidney disease.  Home regimen includes furosemide, carvedilol, hydralazine, and isosorbide.  Currently receiving these medications.  Blood pressure is stable   Plan We will dialyze patient tomorrow in case patient still admitted    LOS: [place], NP 2/26/20238:51 AM

## 2021-08-15 ENCOUNTER — Telehealth: Payer: Self-pay | Admitting: Pharmacist

## 2021-08-15 NOTE — Telephone Encounter (Signed)
08/15/2021 2:52:35 PM - Levemir Flex & needles faxed to [place] - Monday, August 15, 2021 2:49 PM --  PAP enrollment faxed to [employer] for Levemir flex & needles.  08/15/2021 2:49:03 PM - Eliquis faxed to [facility] - Monday, August 15, 2021 2:47 PM -- PAP enrollment faxed to [facility] for Eliquis 5mg .

## 2021-08-18 ENCOUNTER — Ambulatory Visit (INDEPENDENT_AMBULATORY_CARE_PROVIDER_SITE_OTHER): Payer: Medicaid Other

## 2021-08-18 ENCOUNTER — Other Ambulatory Visit: Payer: Self-pay

## 2021-08-18 DIAGNOSIS — G459 Transient cerebral ischemic attack, unspecified: Secondary | ICD-10-CM | POA: Diagnosis not present

## 2021-08-18 DIAGNOSIS — I6522 Occlusion and stenosis of left carotid artery: Secondary | ICD-10-CM

## 2021-08-18 HISTORY — DX: Occlusion and stenosis of left carotid artery: I65.22

## 2021-08-18 MED ORDER — ROSUVASTATIN CALCIUM 40 MG PO TABS
40.0000 mg | ORAL_TABLET | Freq: Every day | ORAL | 1 refills | Status: DC
  Filled 2021-08-18: qty 30, 30d supply, fill #0

## 2021-08-22 ENCOUNTER — Telehealth: Payer: Self-pay

## 2021-08-22 NOTE — Telephone Encounter (Signed)
Attempted to reach pt regarding results, no answer,  ?mailbox full ?Will attempt at later time.  ?

## 2021-08-23 ENCOUNTER — Other Ambulatory Visit: Payer: Self-pay

## 2021-08-23 MED ORDER — ASPIRIN 81 MG PO TBEC
81.0000 mg | DELAYED_RELEASE_TABLET | Freq: Every day | ORAL | 5 refills | Status: DC
  Filled 2021-08-23: qty 30, 30d supply, fill #0
  Filled 2021-09-05: qty 90, 90d supply, fill #0

## 2021-08-24 ENCOUNTER — Telehealth: Payer: Self-pay

## 2021-08-24 NOTE — Telephone Encounter (Signed)
Attempt #2 to reach pt via phone, unsuccessful ?Mailbox remains full, unable to leave message ?Will try again at later time ? ?Possible need for interpreter ?

## 2021-08-26 ENCOUNTER — Telehealth: Payer: Self-pay

## 2021-08-26 ENCOUNTER — Other Ambulatory Visit: Payer: Self-pay

## 2021-08-26 MED ORDER — ATORVASTATIN CALCIUM 80 MG PO TABS
80.0000 mg | ORAL_TABLET | Freq: Every day | ORAL | 3 refills | Status: DC
Start: 2021-08-26 — End: 2021-09-16

## 2021-08-26 MED ORDER — EZETIMIBE 10 MG PO TABS: 10.0000 mg | ORAL_TABLET | Freq: Every day | ORAL | 3 refills | Status: DC

## 2021-08-26 MED ORDER — ROSUVASTATIN CALCIUM 40 MG PO TABS
40.0000 mg | ORAL_TABLET | Freq: Every day | ORAL | 3 refills | Status: DC
Start: 2021-08-26 — End: 2021-10-08

## 2021-08-26 NOTE — Telephone Encounter (Signed)
Able to reach pt with the help of hospital spanish interpreter by 3-way calling, regarding his recent Carotid ultrasound, Dr. Rockey Situ had a chance to review his results and advised  ? ?"Carotid ultrasound  ?Left Carotid: Velocities in the left ICA are consistent with a 60-79% stenosis.  ? ?He has 2 statins on his list can we clarify  ?We also need to add Zetia 10 mg daily for goal LDL less than 70 preferably less than 60  ? ?Needs repeat carotid ultrasound 1 year " ? ? ?Jimmy Olson reports not having his Lipitor or Crestor, needs sent in to [facility] clinic along with new medication Zetia. All three cholesterol medications sent to [facility], pt will pick up newly order medications., Otherwise all questions were address and no additional concerns at this time. Agreeable to plan, will call back for anything further, verbalized appt for June 20 at 11:20 am with Dr. Rockey Situ.  ? ?

## 2021-08-28 NOTE — Progress Notes (Signed)
Patient ID: Jimmy Olson, male    DOB: 04/21/1961, 61 y.o.   MRN: [id number]  HPI  Jimmy Olson interpreter present during entire visit.   Jimmy Olson is a 61 y/o male with a history of CAD, DM, HTN, CKD and chronic heart failure.   Echo report from 08/08/21 reviewed and showed an EF of 40-45%, global hypokinesis, LVH, LA mildly dilated. Echo report from 07/16/20 reviewed and showed an EF of 30-35%.  RHC/LCH done 07/19/20 showed: Prox RCA lesion is 20% stenosed. Mid RCA lesion is 30% stenosed. Dist RCA lesion is 50% stenosed with 50% stenosed side branch in RPAV. Prox LAD lesion is 40% stenosed. 1st Diag lesion is 70% stenosed. Mid LAD lesion is 60% stenosed.   1.  Moderately severely calcified coronary arteries with moderate two-vessel coronary artery disease involving the LAD and right coronary artery.  No evidence of critical stenosis. 2.  Right heart catheterization showed mildly elevated filling pressures, mild pulmonary hypertension and normal cardiac output. 3.  Left ventricular angiography was not performed due to chronic kidney disease.  EF was moderately reduced by echo.  Admitted 2/20-2/26/23 due to HF exacerbation, incidentally found to have COVID 19.  He presents today for a follow-up visit with a chief complaint of moderate fatigue with little exertion. He describes this as chronic in nature having been present for "quite awhile". He has associated shortness of breath, intermittent chest pain, back pain, anxiety and difficulty sleeping along with this. He denies any dizziness, cough, pedal edema, palpitations or abdominal distention.   Not weighing daily, states his scales do not work.   Started on HD in December 2022, MWF.    Past Medical History:  Diagnosis Date   Acute on chronic HFrEF (heart failure with reduced ejection fraction) ([place]) 07/15/2020   ATN (acute tubular necrosis) (HCC)    Chronic HFrEF (heart failure with reduced ejection fraction) ([place])    a. 06/2020 Echo: EF  30-35%, glob HK, GrII DD, nl RV fxn. Mild LAE.   Chronic kidney disease (CKD), stage IV (severe) ([place])    COVID-19 virus infection 02/2020   Demand ischemia (HCC)    Diabetes mellitus without complication ([place])    Elevated troponin 07/15/2020   Hyperkalemia 12/23/2020   Hypertension    NICM (nonischemic cardiomyopathy) ([place])    a. 07/2009 MV: EF 48%; b. 08/2019 Echo: EF 45-50%. Global HK. Mod LVH. GrIDD; c. 06/2020 Echo: EF 30-35%, glob HK, mild LVH, GrII DD, nl RV fxn, mild LAE; d. 06/2020 Cath: nonobs dzs.   Nonobstructive CAD (coronary artery disease)    a. 07/2009 MV: EF 48%. No ischemia; b. 06/2020 Cath: LM min irregs, LAD 40p, 35m, D1 70, D2 min irregs, D3 nl, RI min irregs, LCX small, mild diff dzs, RCA 20p, 70m, 50d, RPAV 50-->Med Rx.   NSTEMI (non-ST elevated myocardial infarction) [facility])    Past Surgical History:  Procedure Laterality Date   APPENDECTOMY     CARDIAC CATHETERIZATION     DIALYSIS/PERMA CATHETER INSERTION N/A 06/02/2021   Procedure: DIALYSIS/PERMA CATHETER INSERTION;  Surgeon: Algernon Huxley, MD;  Location: [place] [facility];  Service: Cardiovascular;  Laterality: N/A;   LEFT HEART CATH AND CORONARY ANGIOGRAPHY N/A 06/03/2021   Procedure: LEFT HEART CATH AND CORONARY ANGIOGRAPHY;  Surgeon: Wellington Hampshire, MD;  Location: [place] [facility];  Service: Cardiovascular;  Laterality: N/A;   NASAL SINUS SURGERY     RIGHT/LEFT HEART CATH AND CORONARY ANGIOGRAPHY N/A 07/19/2020   Procedure: RIGHT/LEFT HEART  CATH AND CORONARY ANGIOGRAPHY;  Surgeon: Wellington Hampshire, MD;  Location: [place] [facility];  Service: Cardiovascular;  Laterality: N/A;   Family History  Problem Relation Age of Onset   Kidney failure Mother        died @ 8   Heart failure Mother    Other Father        he never knew his father   Diabetes Brother    Social History   Tobacco Use   Smoking status: Never   Smokeless tobacco: Never  Substance Use Topics   Alcohol use: Never   No Known  Allergies  Prior to Admission medications   Medication Sig Start Date End Date Taking? Authorizing Provider  albuterol (PROVENTIL HFA) 108 (90 Base) MCG/ACT inhaler INHALE 2 PUFFS INTO THE LUNGS ONCE EVERY 6 HOURS AS NEEDED FOR WHEEZING OR SHORTNESS OF BREATH. 07/13/21  Yes   apixaban (ELIQUIS) 5 MG TABS tablet TAKE 1 TABLET BY MOUTH TWICE DAILY. 07/11/21  Yes   aspirin (ASPIRIN ADULT LOW STRENGTH) 81 MG EC tablet Take 1 tablet (81 mg total) by mouth daily. 08/23/21  Yes   carvedilol (COREG) 25 MG tablet Take 1 tablet (25 mg total) by mouth 2 (two) times daily. 07/25/21  Yes Gollan, Kathlene November, MD  HUMALOG KWIKPEN 100 UNIT/ML KwikPen Inject 3 Units into the skin 3 (three) times daily. 07/11/21  Yes [provider]  hydrALAZINE (APRESOLINE) 100 MG tablet Take 1 tablet (100 mg total) by mouth once every 8 (eight) hours. 07/25/21  Yes Gollan, Kathlene November, MD  insulin detemir (LEVEMIR FLEXTOUCH) 100 UNIT/ML FlexPen Inject 20 Units into the skin once daily at bedtime. 07/12/21  Yes   Insulin Pen Needle 32G X 4 MM MISC USE AS DIRECTED 06/07/21  Yes Sreenath, Sudheer B, MD  isosorbide mononitrate (IMDUR) 30 MG 24 hr tablet Take 1 tablet (30 mg total) by mouth in the morning and at bedtime. 07/25/21  Yes Gollan, Kathlene November, MD  nitroGLYCERIN (NITROSTAT) 0.4 MG SL tablet Place 1 tablet (0.4 mg total) under the tongue every 5 (five) minutes as needed for chest pain. 07/25/20  Yes Jennye Boroughs, MD  rosuvastatin (CRESTOR) 40 MG tablet TAKE 1 TABLET BY MOUTH ONCE DAILY. 08/26/21  Yes Minna Merritts, MD  atorvastatin (LIPITOR) 80 MG tablet Take 1 tablet (80 mg total) by mouth daily. Patient not taking: Reported on 08/29/2021 08/26/21   Minna Merritts, MD  ezetimibe (ZETIA) 10 MG tablet Take 1 tablet (10 mg total) by mouth daily. Patient not taking: Reported on 08/29/2021 08/26/21   Minna Merritts, MD  furosemide (LASIX) 80 MG tablet Take 80 mg daily as needed on nondialysis days for swelling or shortness of  breath Patient not taking: Reported on 08/04/2021 07/25/21   Minna Merritts, MD  gabapentin (NEURONTIN) 300 MG capsule Take 1 capsule (300 mg total) by mouth at bedtime for 7 days. 06/07/21 08/18/21  Sidney Ace, MD  isosorbide dinitrate (ISORDIL) 30 MG tablet TAKE 1 TABLET BY MOUTH ONCE DAILY. 07/12/21 07/13/21      Review of Systems  Constitutional:  Positive for fatigue (easily). Negative for appetite change.  HENT:  Negative for congestion, postnasal drip, rhinorrhea and sore throat.   Eyes: Negative.   Respiratory:  Positive for shortness of breath (with moderate exertion). Negative for cough.   Cardiovascular:  Positive for chest pain (at times). Negative for palpitations and leg swelling.  Gastrointestinal:  Negative for abdominal distention and abdominal pain.  Endocrine: Negative.   Genitourinary: Negative.   Musculoskeletal:  Positive for back pain (upper back). Negative for neck pain.  Skin: Negative.   Allergic/Immunologic: Negative.   Neurological:  Negative for dizziness and light-headedness.  Hematological:  Negative for adenopathy. Does not bruise/bleed easily.  Psychiatric/Behavioral:  Positive for sleep disturbance (2 pillows). The patient is nervous/anxious (at times).    Vitals:   08/29/21 0933 08/29/21 0957  BP: (!) 181/80 (!) 147/75  Pulse: 85   Resp: 18   SpO2: 100%   Weight: 152 lb (68.9 kg)   Height: 5\' 6"  (1.676 m)    Wt Readings from Last 3 Encounters:  08/29/21 152 lb (68.9 kg)  08/14/21 138 lb 9.6 oz (62.9 kg)  08/04/21 148 lb 12.8 oz (67.5 kg)    Lab Results  Component Value Date   CREATININE 3.62 (H) 08/13/2021   CREATININE 5.29 (H) 08/12/2021   CREATININE 3.53 (H) 08/11/2021   Physical Exam Vitals and nursing note reviewed. Exam conducted with a chaperone present (interpreter and wife).  Constitutional:      General: He is not in acute distress.    Appearance: He is well-developed.  HENT:     Head: Normocephalic and atraumatic.   Neck:     Vascular: No JVD.  Cardiovascular:     Rate and Rhythm: Normal rate and regular rhythm.     Heart sounds: No murmur heard. Pulmonary:     Effort: Pulmonary effort is normal. No respiratory distress.     Breath sounds: No wheezing, rhonchi or rales.  Abdominal:     Palpations: Abdomen is soft.     Tenderness: There is no abdominal tenderness.  Musculoskeletal:     Cervical back: Neck supple.     Right lower leg: No tenderness. No edema.     Left lower leg: No tenderness. No edema.  Skin:    General: Skin is warm and dry.  Neurological:     General: No focal deficit present.     Mental Status: He is alert and oriented to person, place, and time.  Psychiatric:        Mood and Affect: Mood normal.        Behavior: Behavior normal.   Assessment & Plan:  1: Chronic heart failure with reduced ejection fraction- - NYHA class III - euvolemic today - he has not been weighing daily but state his baseline is 150-155 - scales provided; reminded to weigh daily and  call for an overnight weight gain of > 2 pounds or a weekly weight gain of > 5 pounds - weight up 14 lbs from last visit, however he states at that time he was dieting and that was an inaccurate weight for him - on GDMT of coreg - dialysis limits other GDMT - not adding salt to his food; reviewed the importance of closely following a 2000mg  sodium diet - saw cardiology Rockey Situ) 07/25/21 - BNP 08/08/21 was 2160  2: HTN- - BP 181/80, rechecked 147/75 - coreg, hydralazine - BMP 08/13/21 reviewed and showed sodium 132, potassium 3.9, creatinine 3.62 and GFR 19 - sees PCP at [facility] in ~ 5 months  3: DM - A1c 07/14/21 was 10.1% - had not checked his BS today yet  4: ESRD - HD started 12/22; Dr. Candiss Norse - MWF; HD Tuesday this week as he did not want to miss his HF appt today; HD tomorrow   Medication bottles reviewed.   Return in 3 months or sooner  for any questions/problems before then.

## 2021-08-29 ENCOUNTER — Encounter: Payer: Self-pay | Admitting: Family

## 2021-08-29 ENCOUNTER — Other Ambulatory Visit: Payer: Self-pay

## 2021-08-29 ENCOUNTER — Ambulatory Visit: Payer: Medicaid Other | Attending: Family | Admitting: Family

## 2021-08-29 VITALS — BP 147/75 | HR 85 | Resp 18 | Ht 66.0 in | Wt 152.0 lb

## 2021-08-29 DIAGNOSIS — A419 Sepsis, unspecified organism: Secondary | ICD-10-CM | POA: Diagnosis present

## 2021-08-29 DIAGNOSIS — F419 Anxiety disorder, unspecified: Secondary | ICD-10-CM | POA: Insufficient documentation

## 2021-08-29 DIAGNOSIS — Z992 Dependence on renal dialysis: Secondary | ICD-10-CM | POA: Insufficient documentation

## 2021-08-29 DIAGNOSIS — G479 Sleep disorder, unspecified: Secondary | ICD-10-CM | POA: Diagnosis not present

## 2021-08-29 DIAGNOSIS — N186 End stage renal disease: Secondary | ICD-10-CM | POA: Diagnosis not present

## 2021-08-29 DIAGNOSIS — Z8616 Personal history of COVID-19: Secondary | ICD-10-CM | POA: Insufficient documentation

## 2021-08-29 DIAGNOSIS — M549 Dorsalgia, unspecified: Secondary | ICD-10-CM | POA: Insufficient documentation

## 2021-08-29 DIAGNOSIS — I10 Essential (primary) hypertension: Secondary | ICD-10-CM

## 2021-08-29 DIAGNOSIS — I272 Pulmonary hypertension, unspecified: Secondary | ICD-10-CM | POA: Diagnosis not present

## 2021-08-29 DIAGNOSIS — I132 Hypertensive heart and chronic kidney disease with heart failure and with stage 5 chronic kidney disease, or end stage renal disease: Secondary | ICD-10-CM | POA: Diagnosis present

## 2021-08-29 DIAGNOSIS — R450 Nervousness: Secondary | ICD-10-CM | POA: Diagnosis not present

## 2021-08-29 DIAGNOSIS — Z794 Long term (current) use of insulin: Secondary | ICD-10-CM

## 2021-08-29 DIAGNOSIS — E1122 Type 2 diabetes mellitus with diabetic chronic kidney disease: Secondary | ICD-10-CM | POA: Diagnosis not present

## 2021-08-29 DIAGNOSIS — I5022 Chronic systolic (congestive) heart failure: Secondary | ICD-10-CM | POA: Diagnosis not present

## 2021-08-29 DIAGNOSIS — Z79899 Other long term (current) drug therapy: Secondary | ICD-10-CM | POA: Diagnosis not present

## 2021-08-29 DIAGNOSIS — I2510 Atherosclerotic heart disease of native coronary artery without angina pectoris: Secondary | ICD-10-CM | POA: Insufficient documentation

## 2021-08-29 NOTE — Assessment & Plan Note (Signed)
POA with fever and tachycardia in setting of Covid-19 pneumonia.  Sepsis physiology resolved.

## 2021-08-29 NOTE — Patient Instructions (Signed)
Start weighing daily. If you have a big weight gain, call us (3 lbs/overnight). ? ?Return in 3 months.  ?

## 2021-09-05 ENCOUNTER — Other Ambulatory Visit: Payer: Self-pay

## 2021-09-06 ENCOUNTER — Emergency Department: Payer: Medicaid Other

## 2021-09-06 ENCOUNTER — Other Ambulatory Visit: Payer: Self-pay

## 2021-09-06 ENCOUNTER — Encounter: Payer: Self-pay | Admitting: Emergency Medicine

## 2021-09-06 ENCOUNTER — Emergency Department
Admission: EM | Admit: 2021-09-06 | Discharge: 2021-09-06 | Disposition: A | Discharge status: Home / Self Care | Payer: Medicaid Other | Attending: Emergency Medicine | Admitting: Emergency Medicine

## 2021-09-06 DIAGNOSIS — Z7901 Long term (current) use of anticoagulants: Secondary | ICD-10-CM | POA: Diagnosis not present

## 2021-09-06 DIAGNOSIS — M79642 Pain in left hand: Secondary | ICD-10-CM | POA: Diagnosis not present

## 2021-09-06 DIAGNOSIS — I2510 Atherosclerotic heart disease of native coronary artery without angina pectoris: Secondary | ICD-10-CM | POA: Diagnosis not present

## 2021-09-06 DIAGNOSIS — R519 Headache, unspecified: Secondary | ICD-10-CM | POA: Diagnosis not present

## 2021-09-06 DIAGNOSIS — R739 Hyperglycemia, unspecified: Secondary | ICD-10-CM | POA: Diagnosis present

## 2021-09-06 DIAGNOSIS — E1122 Type 2 diabetes mellitus with diabetic chronic kidney disease: Secondary | ICD-10-CM | POA: Insufficient documentation

## 2021-09-06 DIAGNOSIS — N186 End stage renal disease: Secondary | ICD-10-CM | POA: Insufficient documentation

## 2021-09-06 DIAGNOSIS — M542 Cervicalgia: Secondary | ICD-10-CM | POA: Diagnosis not present

## 2021-09-06 DIAGNOSIS — E1165 Type 2 diabetes mellitus with hyperglycemia: Secondary | ICD-10-CM | POA: Diagnosis not present

## 2021-09-06 LAB — CBC WITH DIFFERENTIAL/PLATELET
Abs Immature Granulocytes: 0.02 10*3/uL (ref 0.00–0.07)
Basophils Absolute: 0 10*3/uL (ref 0.0–0.1)
Basophils Relative: 0 %
Eosinophils Absolute: 0.1 10*3/uL (ref 0.0–0.5)
Eosinophils Relative: 2 %
HCT: 27.4 % — ABNORMAL LOW (ref 39.0–52.0)
Hemoglobin: 9.4 g/dL — ABNORMAL LOW (ref 13.0–17.0)
Immature Granulocytes: 0 %
Lymphocytes Relative: 13 %
Lymphs Abs: 0.8 10*3/uL (ref 0.7–4.0)
MCH: 29 pg (ref 26.0–34.0)
MCHC: 34.3 g/dL (ref 30.0–36.0)
MCV: 84.6 fL (ref 80.0–100.0)
Monocytes Absolute: 0.4 10*3/uL (ref 0.1–1.0)
Monocytes Relative: 6 %
Neutro Abs: 4.9 10*3/uL (ref 1.7–7.7)
Neutrophils Relative %: 79 %
Platelets: 206 10*3/uL (ref 150–400)
RBC: 3.24 MIL/uL — ABNORMAL LOW (ref 4.22–5.81)
RDW: 12.5 % (ref 11.5–15.5)
WBC: 6.2 10*3/uL (ref 4.0–10.5)
nRBC: 0 % (ref 0.0–0.2)

## 2021-09-06 LAB — BASIC METABOLIC PANEL
Anion gap: 9 (ref 5–15)
BUN: 26 mg/dL — ABNORMAL HIGH (ref 6–20)
CO2: 26 mmol/L (ref 22–32)
Calcium: 8.4 mg/dL — ABNORMAL LOW (ref 8.9–10.3)
Chloride: 98 mmol/L (ref 98–111)
Creatinine, Ser: 2.93 mg/dL — ABNORMAL HIGH (ref 0.61–1.24)
GFR, Estimated: 24 mL/min — ABNORMAL LOW (ref >60)
Glucose, Bld: 531 mg/dL (ref 70–99)
Potassium: 5 mmol/L (ref 3.5–5.1)
Sodium: 133 mmol/L — ABNORMAL LOW (ref 135–145)

## 2021-09-06 LAB — CBG MONITORING, ED
Glucose-Capillary: 478 mg/dL — ABNORMAL HIGH (ref 70–99)
Glucose-Capillary: 156 mg/dL — ABNORMAL HIGH (ref 70–99)

## 2021-09-06 MED ORDER — OXYCODONE HCL 5 MG PO TABS
5.0000 mg | ORAL_TABLET | Freq: Once | ORAL | Status: AC
  Administered 2021-09-06: 5 mg via ORAL
  Filled 2021-09-06: qty 1

## 2021-09-06 MED ORDER — INSULIN ASPART 100 UNIT/ML IJ SOLN
15.0000 [IU] | Freq: Once | INTRAMUSCULAR | Status: AC
  Administered 2021-09-06: 15 [IU] via INTRAVENOUS
  Filled 2021-09-06: qty 1

## 2021-09-06 MED ORDER — LIDOCAINE 5 % EX PTCH
1.0000 | MEDICATED_PATCH | CUTANEOUS | Status: DC
  Administered 2021-09-06: 1 via TRANSDERMAL
  Filled 2021-09-06: qty 1

## 2021-09-06 MED ORDER — ACETAMINOPHEN 500 MG PO TABS
1000.0000 mg | ORAL_TABLET | Freq: Once | ORAL | Status: AC
  Administered 2021-09-06: 1000 mg via ORAL
  Filled 2021-09-06: qty 2

## 2021-09-06 MED ORDER — LIDOCAINE 5 % EX PTCH: 1.0000 | MEDICATED_PATCH | Freq: Two times a day (BID) | CUTANEOUS | 0 refills | Status: DC

## 2021-09-06 NOTE — ED Notes (Signed)
Urinal give to pt per request. ?

## 2021-09-06 NOTE — Discharge Instructions (Signed)
Use Tylenol for pain and fevers.  Up to 1000 mg per dose, up to 4 times per day.  Do not take more than 4000 mg of Tylenol/acetaminophen within 24 hours.. ° °Please use lidocaine patches at your site of pain.  Apply 1 patch at a time, leave on for 12 hours, then remove for 12 hours.  12 hours on, 12 hours off.  Do not apply more than 1 patch at a time. ° °

## 2021-09-06 NOTE — ED Notes (Signed)
Dc instructions and scripts reviewed with pt no questions or concerns at this time.  
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
## 2021-09-06 NOTE — ED Notes (Signed)
Jimmy Julian, MD at bedside with spanish interpreter and this RN. ?

## 2021-09-08 ENCOUNTER — Ambulatory Visit (INDEPENDENT_AMBULATORY_CARE_PROVIDER_SITE_OTHER): Payer: Medicaid Other

## 2021-09-08 ENCOUNTER — Ambulatory Visit (INDEPENDENT_AMBULATORY_CARE_PROVIDER_SITE_OTHER): Payer: Medicaid Other | Admitting: Vascular Surgery

## 2021-09-08 ENCOUNTER — Encounter (INDEPENDENT_AMBULATORY_CARE_PROVIDER_SITE_OTHER): Payer: Self-pay | Admitting: Vascular Surgery

## 2021-09-08 ENCOUNTER — Other Ambulatory Visit: Payer: Self-pay

## 2021-09-08 VITALS — BP 176/88 | HR 79 | Resp 16 | Wt 146.4 lb

## 2021-09-08 DIAGNOSIS — Z992 Dependence on renal dialysis: Secondary | ICD-10-CM

## 2021-09-08 DIAGNOSIS — I152 Hypertension secondary to endocrine disorders: Secondary | ICD-10-CM

## 2021-09-08 DIAGNOSIS — I214 Non-ST elevation (NSTEMI) myocardial infarction: Secondary | ICD-10-CM | POA: Diagnosis not present

## 2021-09-08 DIAGNOSIS — I6522 Occlusion and stenosis of left carotid artery: Secondary | ICD-10-CM | POA: Diagnosis not present

## 2021-09-08 DIAGNOSIS — N186 End stage renal disease: Secondary | ICD-10-CM

## 2021-09-08 DIAGNOSIS — E1169 Type 2 diabetes mellitus with other specified complication: Secondary | ICD-10-CM

## 2021-09-08 DIAGNOSIS — E785 Hyperlipidemia, unspecified: Secondary | ICD-10-CM

## 2021-09-08 DIAGNOSIS — E1159 Type 2 diabetes mellitus with other circulatory complications: Secondary | ICD-10-CM | POA: Diagnosis not present
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
## 2021-09-09 ENCOUNTER — Telehealth: Payer: Self-pay | Admitting: Cardiovascular Disease

## 2021-09-09 NOTE — Telephone Encounter (Signed)
Jimmy Olson calling to make office aware  ?They had a discussion with patient today about medications and patient stated he no longer wants to take atorvastatin 80 MG - states the other medications already make him feel weak ?He did not pick up medication before leaving ? ?

## 2021-09-15 ENCOUNTER — Telehealth (INDEPENDENT_AMBULATORY_CARE_PROVIDER_SITE_OTHER): Payer: Self-pay

## 2021-09-15 NOTE — Telephone Encounter (Signed)
Spoke with the patient using [employer] interpreters and patient is scheduled with Dr. Lucky Cowboy for a left radialcephalic fistula surgery on 09/21/21 at [facility]. Pre-op phone call is on 09/16/21 between 1-5 pm. Pre-surgical instructions were discussed and will be mailed. I then spoke with the patient's daughter Marianna Fuss and gave her all of the information as well. ?

## 2021-09-16 ENCOUNTER — Other Ambulatory Visit (INDEPENDENT_AMBULATORY_CARE_PROVIDER_SITE_OTHER): Payer: Self-pay | Admitting: Nurse Practitioner

## 2021-09-16 ENCOUNTER — Encounter
Admission: RE | Admit: 2021-09-16 | Discharge: 2021-09-16 | Disposition: A | Discharge status: Home / Self Care | Payer: Medicaid Other | Source: Ambulatory Visit | Attending: Vascular Surgery | Admitting: Vascular Surgery

## 2021-09-16 ENCOUNTER — Telehealth: Payer: Self-pay | Admitting: *Deleted

## 2021-09-16 ENCOUNTER — Other Ambulatory Visit: Payer: Self-pay

## 2021-09-16 DIAGNOSIS — N186 End stage renal disease: Secondary | ICD-10-CM

## 2021-09-16 HISTORY — DX: Sepsis, unspecified organism: A41.9

## 2021-09-16 HISTORY — DX: Dependence on renal dialysis: Z99.2

## 2021-09-16 HISTORY — DX: Acute embolism and thrombosis of left axillary vein: I82.A12

## 2021-09-16 HISTORY — DX: Transient cerebral ischemic attack, unspecified: G45.9

## 2021-09-16 HISTORY — DX: End stage renal disease: N18.6

## 2021-09-16 HISTORY — DX: Anemia, unspecified: D64.9

## 2021-09-16 NOTE — Patient Instructions (Addendum)
Su procedimiento est? programado para: jueves 13 de abril ?Pres?ntese en el mostrador de Music therapist. ?Para saber su hora de llegada, llame al [phone] entre la 1 p. m. y las 3 p. m. el: mi?rcoles 12 de abril ? ?RECORDAR: ?Las instrucciones que no se siguen por Paramedic en un riesgo m?dico grave, que puede incluir la muerte; o seg?n el criterio de su cirujano y Health and safety inspector, es posible que sea necesario reprogramar su cirug?a. ? ?No coma alimentos despu?s de la medianoche de la noche anterior a la cirug?a. ?No masticar chicle, pastillas o caramelos duros. ? ?Sin embargo, puede beber [employer] 2 horas antes de la fecha prevista para la cirug?a. No beba nada dentro de las 2 horas de su hora de llegada programada. ? ?TOME ESTOS MEDICAMENTOS LA MA?ANA DE LA CIRUG?A CON UN SORBO DE AGUA: ? ?1. Inhalador de albuterol ?2. Carvedilol ?3. Hidralazina ?4. Mononitrato de isosorbida ?5. Rosuvastatina ? ?Use inhaladores el d?a de la cirug?a y ll?velos al hospital. ? ?Solo tome la mitad (13 unidades) de su insulina levemir la noche anterior a la cirug?a. ?NO se aplique insulina la ma?ana de la cirug?a. ? ?Siga las recomendaciones del cardi?logo, neum?logo o PCP con respecto a la suspensi?n de Eliquis. ?Seg?n el protocolo del Dr. Lucky Cowboy; Suspender apixaban (Eliquis) 2 d?as antes de la cirug?a. El ?ltimo d?a para tomar Eliquis es el lunes 10 de abril. Reanude DESPU?S de la cirug?a seg?n las instrucciones del cirujano. ? ?Una semana antes de la cirug?a: a partir del 6 de abril ?Detener los antiinflamatorios (AINE) como Advil, Aleve, Ibuprofen, Motrin, Naproxen, Naprosyn y productos a base de aspirina como Excedrin, Goodys Powder, [employer]. ?Suspenda CUALQUIER suplemento de venta libre hasta despu?s de la cirug?a. ?Sin embargo, puede continuar tomando Tylenol si es necesario para el dolor hasta el d?a de la cirug?a. ? ?No alcohol por 24 horas antes o despu?s de la cirug?a. ? ?No  fumar, incluidos los cigarrillos electr?nicos, durante las 24 horas previas a la cirug?a. ?No productos de tabaco masticables durante al menos 6 horas antes de la cirug?a. ?Sin parches de nicotina el d?a de la cirug?a. ? ?No use ning?n medicamento "recreativo" durante al menos una semana antes de su cirug?a. ?Tenga en cuenta que la combinaci?n de coca?na y anestesia puede tener resultados negativos, incluso la [place]. ?Si la prueba de coca?na da positivo, se cancelar? la cirug?a. ? ?En la ma?ana de la cirug?a cep?llese los dientes con pasta dental y agua, puede enjuagarse la boca con enjuague bucal si lo desea. ?No trague ninguna pasta de dientes o enjuague bucal. ? ?Use CHG Soap como se indica en la hoja de instrucciones. ? ?No use joyas. ? ?No use lociones, talcos ni perfumes. ? ?No se afeite el cuerpo desde el cuello hacia abajo 48 horas antes de la cirug?a en caso de que se corte, lo que podr?a dejar un sitio para la infecci?n. ?Adem?s, la piel reci?n afeitada puede irritarse si se [place] el jab?n CHG. ? ?No se pueden usar lentes de contacto, aud?fonos ni dentaduras postizas en la cirug?a. ? ?No lleve objetos de valor al hospital. [facility] no se responsabiliza por pertenencias u objetos de valor extraviados o perdidos. ? ?Informe a su m?dico si hay alg?n cambio en su condici?n m?dica (resfriado, fiebre, infecci?n). ? ?Use ropa c?moda (espec?fica para su tipo de cirug?a) al hospital. ? ?Despu?s de la cirug?a, puede ayudar a prevenir complicaciones pulmonares haciendo ejercicios de respiraci?n. ?  Tome respiraciones profundas y tosa cada 1-2 horas. Su m?dico puede ordenar un dispositivo llamado espir?metro de incentivo para ayudarlo a Ambulance person profundamente. ? ?Si le dan de alta el d?a de la cirug?a, no se le permitir? Theatre stage manager. ?Necesitar? un adulto responsable (mayor de 18 a?os) que lo lleve a su casa y se quede con usted esa noche. ? ?Si viaja en transporte p?blico, deber? ir acompa?ado de un adulto  responsable (mayor de 18 a?os). ?Confirme con su m?dico que es aceptable usar el transporte p?blico. ? ?Llame al Maryln Manuel de Preadmisi?n al [phone] si tiene alguna pregunta sobre estas instrucciones. ? ?Pol?tica de visitas a la cirug?a: ? ?Los [employer] se someten a Furniture conservator/restorer cirug?a o procedimiento pueden [employer] familiares o personas de apoyo con ellos, siempre que la persona no sea COVID-19 positiva o experimente sus s?ntomas. ? ? ? ? ? ? ?Your procedure is scheduled on: Thursday, April 13 ?Report to the Registration Desk on the 1st floor of the [facility]. ?To find out your arrival time, please call [phone] between 1PM - 3PM on: Wednesday, April 12 ? ?REMEMBER: ?Instructions that are not followed completely may result in serious medical risk, up to and including death; or upon the discretion of your surgeon and anesthesiologist your surgery may need to be rescheduled. ? ?Do not eat food after midnight the night before surgery.  ?No gum chewing, lozengers or hard candies. ? ?You may however, drink water up to 2 hours before you are scheduled to arrive for your surgery. Do not drink anything within 2 hours of your scheduled arrival time. ? ?TAKE THESE MEDICATIONS THE MORNING OF SURGERY WITH A SIP OF WATER: ? ?Albuterol inhaler ?Carvedilol ?Hydralazine ?Isosorbide mononitrate ?Rosuvastatin ? ?Use inhalers on the day of surgery and bring to the hospital. ? ?Only take 1/2 (13 units) of your levemir insulin the night before surgery. ?Do NOT take any insulin on the morning of surgery. ? ?Follow recommendations from Cardiologist, Pulmonologist or PCP regarding stopping Eliquis. ?According to Dr. Bunnie Domino protocol; Hold apixaban (Eliquis) 2 days prior to surgery. Last day to take Eliquis is Monday, April 10. Resume AFTER surgery per surgeon instruction.  ? ?One week prior to surgery: starting April 6 ?Stop Anti-inflammatories (NSAIDS) such as Advil, Aleve, Ibuprofen, Motrin, Naproxen, Naprosyn  and Aspirin based products such as Excedrin, Goodys Powder, BC Powder. ?Stop ANY OVER THE COUNTER supplements until after surgery. ?You may however, continue to take Tylenol if needed for pain up until the day of surgery. ? ?No Alcohol for 24 hours before or after surgery. ? ?No Smoking including e-cigarettes for 24 hours prior to surgery.  ?No chewable tobacco products for at least 6 hours prior to surgery.  ?No nicotine patches on the day of surgery. ? ?Do not use any "recreational" drugs for at least a week prior to your surgery.  ?Please be advised that the combination of cocaine and anesthesia may have negative outcomes, up to and including death. ?If you test positive for cocaine, your surgery will be cancelled. ? ?On the morning of surgery brush your teeth with toothpaste and water, you may rinse your mouth with mouthwash if you wish. ?Do not swallow any toothpaste or mouthwash. ? ?Use CHG Soap as directed on instruction sheet. ? ?Do not wear jewelry. ? ?Do not wear lotions, powders, or perfumes.  ? ?Do not shave body from the neck down 48 hours prior to surgery just in case you cut yourself which  could leave a site for infection.  ?Also, freshly shaved skin may become irritated if using the CHG soap. ? ?Contact lenses, hearing aids and dentures may not be worn into surgery. ? ?Do not bring valuables to the hospital. [facility] is not responsible for any missing/lost belongings or valuables.  ? ?Notify your doctor if there is any change in your medical condition (cold, fever, infection). ? ?Wear comfortable clothing (specific to your surgery type) to the hospital. ? ?After surgery, you can help prevent lung complications by doing breathing exercises.  ?Take deep breaths and cough every 1-2 hours. Your doctor may order a device called an Incentive Spirometer to help you take deep breaths. ? ?If you are being discharged the day of surgery, you will not be allowed to drive home. ?You will need a responsible  adult (18 years or older) to drive you home and stay with you that night.  ? ?If you are taking public transportation, you will need to have a responsible adult (18 years or older) with you. ?Please con

## 2021-09-16 NOTE — Telephone Encounter (Signed)
Request for pre-operative cardiac clearance ?Received: Today ?Karen Kitchens, NP  P Cv Div Preop Callback ?Request for pre-operative cardiac clearance:  ?   ?1. What type of surgery is being performed?  ?ARTERIOVENOUS (AV) FISTULA CREATION ( RADIAL CEPHALIC)  ? ?2. When is this surgery scheduled?  ?09/29/2021  ?   ?3. Type of clearance being requested (medical, pharmacy, both).  ?BOTH  ?   ?4. Are there any medications that need to be held prior to surgery?  ?APIXABAN  ? ?5. Practice name and name of physician performing surgery?  ?Performing surgeon: Dr. Leotis Pain, MD  ?Requesting clearance: Honor Loh, FNP-C    ?   ?6. Anesthesia type (none, local, MAC, general)?  ?GENERAL  ? ?7. What is the office phone and fax number?    ?Phone: [phone]  ?Fax: [phone]  ? ?ATTENTION: Unable to create telephone message as per your standard workflow. Directed by [facility] providers to send requests for cardiac clearance to this pool for appropriate distribution to provider covering pre-operative clearances.  ? ?Honor Loh, MSN, APRN, FNP-C, CEN  ?[place]  ?Peri-operative Services Nurse Practitioner  ?Phone: [phone]  ?09/16/21 3:49 PM   ?

## 2021-09-16 NOTE — Pre-Procedure Instructions (Signed)
During pre-op phone interview, patient indicated that he has dialysis on M-W-F and the dialysis center could not switch his dialysis day to accommodate for his surgery being scheduled on those days. Dr. Bunnie Domino office made aware that his surgery was currently scheduled on a Wednesday, April 5. Surgery was then ultimately rescheduled for Thursday, April 13. Return call to patient to verify if that was good with him which it is. Surgery rescheduled for April 13.  ?

## 2021-09-16 NOTE — Telephone Encounter (Signed)
? ?  Primary Cardiologist: Ida Rogue, MD ? ?Chart reviewed as part of pre-operative protocol coverage. Given past medical history and time since last visit, based on ACC/AHA guidelines, Jimmy Olson would be at acceptable risk for the planned procedure without further cardiovascular testing.  ? ?His apixaban is not prescribed by cardiology.  It is managed by another provider.  Please seek recommendations on holding apixaban from prescribing provider. ? ? ?I will route this recommendation to the requesting party via Epic fax function and remove from pre-op pool. ? ?Please call with questions. ? ?Jossie Ng. Ashyah Quizon NP-C ? ?  ?09/16/2021, 4:56 PM ?[place] ?[address] ?Office [phone] Fax [phone] ? ? ? ?

## 2021-09-19 ENCOUNTER — Encounter: Payer: Self-pay | Admitting: Vascular Surgery

## 2021-09-19 ENCOUNTER — Other Ambulatory Visit: Payer: Self-pay

## 2021-09-19 NOTE — Progress Notes (Signed)
?Perioperative Services ? ?Pre-Admission/Anesthesia Testing Clinical Review ? ?Date: 09/27/21 ? ?Patient Demographics:  ?Name: Jimmy Olson ?DOB:   09-Jul-1960 ?MRN:   [id number] ? ?Planned Surgical Procedure(s):  ? ? Case: [id number] Date/Time: 09/29/21 1230  ? Procedure: ARTERIOVENOUS (AV) FISTULA CREATION ( RADIAL CEPHALIC) (Left)  ? Anesthesia type: General  ? Pre-op diagnosis: ESRD  ? Location: [facility] OR ROOM 09 / [facility] ORS FOR ANESTHESIA GROUP  ? Surgeons: Algernon Huxley, MD  ? ?NOTE: Available PAT nursing documentation and vital signs have been reviewed. Clinical nursing staff has updated patient's PMH/PSHx, current medication list, and drug allergies/intolerances to ensure comprehensive history available to assist in medical decision making as it pertains to the aforementioned surgical procedure and anticipated anesthetic course. Extensive review of available clinical information performed. [place] PMH and PSHx updated with any diagnoses/procedures that  may have been inadvertently omitted during his intake with the pre-admission testing department's nursing staff. ? ?Clinical Discussion:  ?Jimmy Olson is a 61 y.o. male who is submitted for pre-surgical anesthesia review and clearance prior to him undergoing the above procedure. Patient has never been a smoker. Pertinent PMH includes: CAD, NSTEMI, NICM, HFrEF, carotid artery stenosis, TIA, DVT, HTN, HLD, T2DM, ESRD on dialysis, anemia. ? ?Patient is followed by cardiology Rockey Situ, MD). He was last seen in the cardiology clinic on 07/25/2021; notes reviewed.  At the time of his clinic visit, patient reporting nocturnal shortness of breath, PND, and orthopnea.  Patient sleeping in a recliner and reporting that he "feels like he needs oxygen".  Patient denied any episodes of chest pain, palpitations, significant peripheral edema, vertiginous symptoms, or presyncope/syncope.  Patient on hemodialysis on Monday, Wednesday, and Friday.  He reports that his dialysis  treatments were cut back from 4 hours to 3 hours and 45 minutes.  Past medical history significant for cardiovascular diagnoses. ? ?Patient suffered an NSTEMI on 07/15/2020.  Subsequent diagnostic right/left heart catheterization performed on 07/19/2020 revealing multivessel CAD; 20% proximal RCA, 30% mid RCA, 50% distal RCA, 50% RPAV, 40% proximal LAD, 70% D1, and 60% mid LAD. LVEDP elevated at 19 mmHg.  PA pressure 37/16; mean 26 mmHg.  PCWP 13 mmHg.  Intervention deferred opting for medical management. ? ?Diagnostic left heart catheterization performed on 06/03/2021 revealing a mildly reduced left ventricular systolic function with an EF of 45-50%.  LVEDP mildly elevated at 18 mmHg.  There was multivessel CAD; 40% proximal LAD, 60% mid LAD, 20% proximal RCA, 30% mid RCA, 70% D1, 30% distal RCA, and 40% RPDA.  Intervention deferred opting for medical management. ? ?Patient suffered a TIA and 06/2021.  Patient admitted for severe headache along with LEFT upper extremity numbness, vertiginous symptoms, and inability to stand unassisted.  Imaging revealed no evidence of acute stroke. Patient diagnosed with TIA. Recommendations from neurology were for him to continue daily anticoagulation therapy. ? ?Last TTE was performed on 08/08/2021 revealing a mildly decreased left ventricular systolic function with an EF of 40-45%. There was global hypokinesis demonstrated. Mild LVH. GLS -8.6.  Left atrium mildly dilated. There is no evidence of significant valvular regurgitation or transvalvular gradient suggestive of stenosis.    ? ?Carotid Doppler study performed on 08/18/2021 revealed a 67-34% LICA stenosis with no evidence of stenosis contralaterally. Non-hemodynamically significant plaque (<50%) noted in the BILATERAL CCA's. Bilateral vertebral arteries demonstrated antegrade flow. There were normal flow hemodynamics in the BILATERAL subclavian arteries. ? ?Patient with an upper extremity DVT diagnosed in 05/2021.  He  remains on chronic  anticoagulation therapy using apixaban; compliant with therapy with no evidence or reports of GI bleeding.  Blood pressure reasonably controlled at 130/62 on currently prescribed beta-blocker, diuretic, vasodilator, and nitrate therapies.  In addition to scheduled nitrate therapy, patient has SL NTG to use on a as needed basis; denies recent use.  Patient is on a statin for his HLD diagnosis.  T2DM poorly controlled on currently prescribed regimen; last HgbA1c was 10.1% when checked on 07/14/2021.  Functional capacity limited by underlying cardiopulmonary diagnoses and multiple medical comorbidities.  With that being said, patient still felt to be able to achieve at least 4 METS of activity without angina/anginal equivalent symptoms.  Given patient's reports of PND and orthopnea, furosemide 80 mg on Saturday and Sunday recommended.  Additionally, patient was encouraged to discuss symptoms with nephrology to determine need for diuretic dose on Tuesday and Thursdays as well.  No other changes were made to his medication regimen.  Patient to follow-up with outpatient cardiology in 3 months or sooner if needed. ? ?Jimmy Olson is scheduled for a LEFT RADIOCEPHALIC ARTERIOVENOUS FISTULA CREATION on 09/29/2021 with Dr. Leotis Pain, MD.  Given patient's past medical history significant for cardiovascular diagnoses, presurgical cardiac clearance was sought by the PAT team. Patient initially cleared by cardiology for this procedure at an acceptable risk, however ECG changes were noted during his PAT appointment. Patient with TWI both acute and progressive changes (TWI I, II, aVL, V3-V6; T wave flattening in aVF). Tracing reviewed with cardiology APP who recommended further evaluation by primary cardiologist prior to undergoing planned surgical intervention. Patient was seen by Dr. Rockey Situ, MD on 09/27/2021; notes reviewed. Patient doing well overall from a cardiovascular perspective. He denied any  significant chest pain or shortness of breath. Patient fatigued secondary to hemodialysis and current medication regimen. He was off of prescribed furosemide (appeared euvolemic on exam) and now on dual beta blocker therapy (bisoprolol + carvedilol). Following consultation, cardiology advising that "patient is optimized for planned procedure with no further workup needed at this time. Patient made proceed with an overall ACCEPTABLE risk of significant perioperative cardiovascular complications".  Again, this patient is on daily anticoagulation therapy.  He has been instructed on recommendations from his vascular surgeon for holding his apixaban dose for 2 days prior to his procedure with plans to restart since postoperative bleeding risk felt to be minimized.  The patient was advised that his last dose of apixaban should be on 09/26/2021. ? ?Patient denies previous perioperative complications with anesthesia in the past.  In review his EMR, there are no records available for review pertaining to past procedural/anesthetic courses within the [facility] system. ? ? ?  09/27/2021  ?  3:22 PM 09/16/2021  ?  2:58 PM 09/08/2021  ?  8:45 AM  ?Vitals with BMI  ?Height 5\' 6"  5\' 6"    ?Weight 150 lbs 4 oz 148 lbs 146 lbs 6 oz  ?BMI 24.26 23.9 23.64  ?Systolic 914  782  ?Diastolic 60  88  ?Pulse 80  79  ? ? ?Providers/Specialists:  ? ?NOTE: Primary physician provider listed below. Patient may have been seen by APP or partner within same practice.  ? ?PROVIDER ROLE / SPECIALTY LAST OV  ?Algernon Huxley, MD Vascular Surgery (Surgeon) 09/08/2021  ?[facility] [place] Primary Care Provider ???  ?Ida Rogue, MD Cardiology 09/27/2021  ?Murlean Iba, MD Nephrology 05/26/2021  ? ?Allergies:  ?Patient has no known allergies. ? ?Current Home Medications:  ? ?No current facility-administered  medications for this encounter.  ? ? albuterol (PROVENTIL HFA) 108 (90 Base) MCG/ACT inhaler  ? apixaban (ELIQUIS) 5 MG TABS  tablet  ? carvedilol (COREG) 25 MG tablet  ? ezetimibe (ZETIA) 10 MG tablet  ? HUMALOG KWIKPEN 100 UNIT/ML KwikPen  ? hydrALAZINE (APRESOLINE) 100 MG tablet  ? insulin detemir (LEVEMIR FLEXTOUCH) 100 UNIT/ML FlexPen  ? In

## 2021-09-20 ENCOUNTER — Encounter: Payer: Self-pay | Admitting: Urgent Care

## 2021-09-20 ENCOUNTER — Encounter
Admission: RE | Admit: 2021-09-20 | Discharge: 2021-09-20 | Disposition: A | Discharge status: Home / Self Care | Payer: Medicare Other | Source: Ambulatory Visit | Attending: Vascular Surgery | Admitting: Vascular Surgery

## 2021-09-20 ENCOUNTER — Telehealth: Payer: Self-pay | Admitting: *Deleted

## 2021-09-20 DIAGNOSIS — Z01818 Encounter for other preprocedural examination: Secondary | ICD-10-CM | POA: Diagnosis present

## 2021-09-20 DIAGNOSIS — N186 End stage renal disease: Secondary | ICD-10-CM | POA: Insufficient documentation

## 2021-09-20 LAB — CBC WITH DIFFERENTIAL/PLATELET
Abs Immature Granulocytes: 0.01 10*3/uL (ref 0.00–0.07)
Basophils Absolute: 0 10*3/uL (ref 0.0–0.1)
Basophils Relative: 1 %
Eosinophils Absolute: 0.2 10*3/uL (ref 0.0–0.5)
Eosinophils Relative: 5 %
HCT: 29.7 % — ABNORMAL LOW (ref 39.0–52.0)
Hemoglobin: 10 g/dL — ABNORMAL LOW (ref 13.0–17.0)
Immature Granulocytes: 0 %
Lymphocytes Relative: 22 %
Lymphs Abs: 0.9 10*3/uL (ref 0.7–4.0)
MCH: 28.2 pg (ref 26.0–34.0)
MCHC: 33.7 g/dL (ref 30.0–36.0)
MCV: 83.7 fL (ref 80.0–100.0)
Monocytes Absolute: 0.4 10*3/uL (ref 0.1–1.0)
Monocytes Relative: 9 %
Neutro Abs: 2.7 10*3/uL (ref 1.7–7.7)
Neutrophils Relative %: 63 %
Platelets: 164 10*3/uL (ref 150–400)
RBC: 3.55 MIL/uL — ABNORMAL LOW (ref 4.22–5.81)
RDW: 13.7 % (ref 11.5–15.5)
WBC: 4.3 10*3/uL (ref 4.0–10.5)
nRBC: 0 % (ref 0.0–0.2)

## 2021-09-20 LAB — BASIC METABOLIC PANEL
Anion gap: 9 (ref 5–15)
BUN: 32 mg/dL — ABNORMAL HIGH (ref 6–20)
CO2: 28 mmol/L (ref 22–32)
Calcium: 8.8 mg/dL — ABNORMAL LOW (ref 8.9–10.3)
Chloride: 100 mmol/L (ref 98–111)
Creatinine, Ser: 3.17 mg/dL — ABNORMAL HIGH (ref 0.61–1.24)
GFR, Estimated: 22 mL/min — ABNORMAL LOW (ref >60)
Glucose, Bld: 343 mg/dL — ABNORMAL HIGH (ref 70–99)
Potassium: 3.7 mmol/L (ref 3.5–5.1)
Sodium: 137 mmol/L (ref 135–145)

## 2021-09-20 NOTE — Progress Notes (Signed)
?  Perioperative Services ?Pre-Admission/Anesthesia Testing ? ?  ?Date: 09/20/21 ? ?Name: Jimmy Olson ?MRN:   [id number] ? ?Re: EKG changes and need for further evaluation ? ?Planned Surgical Procedure(s):  ?ARTERIOVENOUS (AV) FISTULA CREATION ( RADIAL CEPHALIC)          [id number] ? ?Clinical Notes:  ?Patient scheduled for the above procedure on 09/29/2021 with Jimmy Olson.  In preparation for his procedure, patient presented to the PAT clinic on the morning of 09/20/2021 for preoperative labs and ECG.  Preoperative ECG revealed normal sinus rhythm at a rate of 73 bpm.  There were inferior and anterolateral ST and T wave abnormalities noted.  In review of his previous tracing, anterolateral TWIs (V3-V6, 1, aVL) appear to have progressed, and there is a presumably new TWI in lead II; T wave flattening noted in aVF. ? ? ? ?Of note, patient has been cleared by cardiology to proceed with his vascular procedure.  In light of the ECG changes noted on today's tracing, cardiovascular team was asked to review ECG and make recommendations on patient disposition.  ? ?It is important to note that patient underwent diagnostic left heart catheterization performed on 06/03/2021 revealing a mildly reduced left ventricular systolic function with an EF of 45-50%.  LVEDP mildly elevated at 18 mmHg.  There was multivessel CAD; 40% proximal LAD, 60% mid LAD, 20% proximal RCA, 30% mid RCA, 70% D1, 30% distal RCA, and 40% RPDA.  Intervention deferred opting for medical management ? ?ECG reviewed by Jimmy Olson who agreed with the above assessment. Per cardiology APP, patient needs to be seen in clinic by Jimmy Olson or covering physician prior to undergoing his vascular surgery. I have been in contact with scheduling Jimmy Eves, RN). I will plan on forward a copy of this note to her for follow up and patient scheduling.  ? ?Impression and Plan:  ?Jimmy Olson with both acute and progressive ECG changes. Reviewed with cardiology APP who  recommended in office evaluation prior to proceeding with the planned vascular surgery. Cardiology to reach out to patient to schedule.  Communication sent to primary attending surgeon Jimmy Cowboy, MD) to make him aware of the aforementioned.  No changes are being made to the surgical schedule at this time.  We will plan on following up on patient disposition from cardiology, taking into account of course, any and all presurgical recommendations.  Appreciate consult and subsequent follow-up from [facility] team [place], PA-C and [place], Therapist, sports) today.  ? ?Honor Loh, MSN, APRN, FNP-C, CEN ?[place]  ?Peri-operative Services Nurse Practitioner ?Phone: [phone] ?09/20/21 11:39 AM ? ?NOTE: This note has been prepared using [employer]. Despite my best ability to proofread, there is always the potential that unintentional transcriptional errors may still occur from this process. ? ?

## 2021-09-20 NOTE — Telephone Encounter (Signed)
Pt has been scheduled to see Dr. Rockey Situ 09/27/21 @ 3:20, clearance will be addressed at that time. ? ?Will let Honor Loh, NP, know.  ?

## 2021-09-23 ENCOUNTER — Ambulatory Visit: Payer: Medicaid Other | Admitting: Cardiovascular Disease

## 2021-09-23 ENCOUNTER — Other Ambulatory Visit: Payer: Self-pay

## 2021-09-26 NOTE — Progress Notes (Signed)
Cardiology Office Note ? ?Date:  09/27/2021  ? ?ID:  Jimmy Olson, DOB 16-Feb-1961, MRN [id number] ? ?PCP:  [facility] [place]  ? ?Chief Complaint  ?Patient presents with  ? Pre op clearance   ?  ARTERIOVENOUS (AV) FISTULA CREATION ( RADIAL CEPHALIC. Patient c/o shortness of breath and chest tightness/indigestion.   ? ? ?HPI:  ?Jimmy Olson is a 61 y.o. male with history of  ?nonobstructive CAD by [place] in 06/2020,  ?chronic combined systolic and diastolic CHF,  ?NICM,  ?CKD stage IV/esrd, on HD ?Covid infection in 02/2020 status post monoclonal antibody infusion,  ?DM2,  ?HTN,  ?HLD, " ?anemia, and anxiety  ?On Eliquis for TIA, DVT ?who presents for follow up of his cardiomyopathy, CAD/nsetmi, DVT lower extremity December 2022 on Eliquis ? ?Last seen in clinic by myself August 2022 ?Reports he is doing well with dialysis and fluid removal ?Typical run time 3:30 to 3:45 three times a week ?Sleeping ok, more during the daytime as medications make you feel tired ? ?Reports he is taking both bisoprolol and carvedilol ?No longer takes Lasix, off gabapentin ? ?Scheduled for surgery AV fistula in 2 days time with Dr. Lucky Cowboy ? ?Denies significant chest pain concerning for angina ? ?EKG personally reviewed by myself on todays visit ?Normal sinus rhythm rate 80 bpm T wave inversions V3 through V6 1 and aVL ? ?Past medical history reviewed ?Left heart catheterization June 03, 2021  ?status post ablation of AVNRT 05/20/2019. ?1.  Heavily calcified coronary arteries with moderate two-vessel coronary artery disease involving the LAD and right coronary artery.  No culprit is identified for non-STEMI. ?2.  Mildly reduced LV systolic function and mildly elevated left ventricular end-diastolic pressure. ? disease appears to be chronic with no acute culprit identified ? ?Echocardiogram  ?Left ventricular ejection fraction, by estimation, is 45 to 50%. The  ?left ventricle has mildly decreased function. The left ventricle   ?demonstrates global hypokinesis. There is mild left ventricular  ?hypertrophy. Left ventricular diastolic parameters  ?are consistent with Grade I diastolic dysfunction (impaired relaxation).  ? 2. Right ventricular systolic function is normal. The right ventricular  ?size is normal. Tricuspid regurgitation signal is inadequate for assessing  ?PA pressure.  ? ?discharge from the hospital July 15, 2021 ?Admission to the hospital for Severe headache along with left arm numbness and dizziness inability to stand without assistance: ?Work-up showed no acute stroke, likely TIA.  Continue Eliquis was recommendation ? ? hospital in 08/2019 with exertional chest pain with high-sensitivity troponin peaking at 199.  Echo showed an EF of 45 to 50%, global hypokinesis, moderate LVH, and grade 1 diastolic dysfunction.   ? ?admitted in 06/2020 with acute on chronic combined systolic and diastolic CHF complicated by acute on CKD stage IV.   ?Echo on 07/16/2020 showed an EF of 30 to 35%, global hypokinesis, mildly dilated LV cavity size, mild LVH, grade 2 diastolic dysfunction, normal RV systolic function and ventricular cavity size, mildly dilated left atrium, and mild mitral valve regurgitation.   ? ?R/LHC on 07/19/2020 showed moderately calcified coronary arteries with moderate two-vessel CAD involving the LAD and RCA  ?RHC showed mildly elevated filling pressures, mild pulmonary hypertension, and normal cardiac output.   ? ?acute on CKD stage IV with consideration for hemodialysis  ?evaluated by psychiatry with anxiety, depression, and suicidal ideation.   ? ? ?PMH:   has a past medical history of Anemia, Anxiety and depression, ATN (acute tubular necrosis) ([place]), CAD (  coronary artery disease), Chronic HFrEF (heart failure with reduced ejection fraction) ([place]), DVT of axillary vein, acute left ([place]) (05/2021), ESRD (end stage renal disease) on dialysis [facility]), History of 2019 novel coronavirus disease (COVID-19) (03/06/2020),  HLD (hyperlipidemia), Hyperkalemia (12/23/2020), Hypertension, Left carotid artery stenosis (08/18/2021), Long term current use of anticoagulant, NICM (nonischemic cardiomyopathy) ([place]), NSTEMI (non-ST elevated myocardial infarction) ([place]) (07/15/2020), Sepsis ([place]), Suicidal ideations, T1DM (type 1 diabetes mellitus) ([place]), and TIA (transient ischemic attack) (06/2021). ? ?PSH:    ?Past Surgical History:  ?Procedure Laterality Date  ? APPENDECTOMY    ? CARDIAC CATHETERIZATION    ? DIALYSIS/PERMA CATHETER INSERTION N/A 06/02/2021  ? Procedure: DIALYSIS/PERMA CATHETER INSERTION;  Surgeon: Algernon Huxley, MD;  Location: [place] [facility];  Service: Cardiovascular;  Laterality: N/A;  ? LEFT HEART CATH AND CORONARY ANGIOGRAPHY N/A 06/03/2021  ? Procedure: LEFT HEART CATH AND CORONARY ANGIOGRAPHY;  Surgeon: Wellington Hampshire, MD;  Location: [place] [facility];  Service: Cardiovascular;  Laterality: N/A;  ? NASAL SINUS SURGERY    ? RIGHT/LEFT HEART CATH AND CORONARY ANGIOGRAPHY N/A 07/19/2020  ? Procedure: RIGHT/LEFT HEART CATH AND CORONARY ANGIOGRAPHY;  Surgeon: Wellington Hampshire, MD;  Location: [place] [facility];  Service: Cardiovascular;  Laterality: N/A;  ? ? ?Current Outpatient Medications  ?Medication Sig Dispense Refill  ? albuterol (PROVENTIL HFA) 108 (90 Base) MCG/ACT inhaler INHALE 2 PUFFS INTO THE LUNGS ONCE EVERY 6 HOURS AS NEEDED FOR WHEEZING OR SHORTNESS OF BREATH. 6.7 g 10  ? apixaban (ELIQUIS) 5 MG TABS tablet TAKE 1 TABLET BY MOUTH TWICE DAILY. (Patient taking differently: Take 5 mg by mouth daily.) 180 tablet 1  ? bisoprolol (ZEBETA) 10 MG tablet Take 10 mg by mouth daily.    ? carvedilol (COREG) 25 MG tablet Take 1 tablet (25 mg total) by mouth 2 (two) times daily. 60 tablet 6  ? ezetimibe (ZETIA) 10 MG tablet Take by mouth.    ? HUMALOG KWIKPEN 100 UNIT/ML KwikPen Inject into the skin 3 (three) times daily. Sliding scale    ? hydrALAZINE (APRESOLINE) 100 MG tablet Take 1 tablet (100 mg total) by  mouth once every 8 (eight) hours. (Patient taking differently: Take 100 mg by mouth 2 (two) times daily.) 90 tablet 6  ? insulin detemir (LEVEMIR FLEXTOUCH) 100 UNIT/ML FlexPen Inject 20 Units into the skin once daily at bedtime. (Patient taking differently: Inject 25 Units into the skin at bedtime.) 15 mL 5  ? isosorbide mononitrate (IMDUR) 30 MG 24 hr tablet Take 1 tablet (30 mg total) by mouth in the morning and at bedtime. 60 tablet 6  ? nitroGLYCERIN (NITROSTAT) 0.4 MG SL tablet Place 1 tablet (0.4 mg total) under the tongue every 5 (five) minutes as needed for chest pain. 30 tablet 0  ? rosuvastatin (CRESTOR) 40 MG tablet TAKE 1 TABLET BY MOUTH ONCE DAILY. 90 tablet 3  ? furosemide (LASIX) 80 MG tablet Take 80 mg daily as needed on nondialysis days for swelling or shortness of breath (Patient not taking: Reported on 09/27/2021) 90 tablet 3  ? Insulin Pen Needle 32G X 4 MM MISC USE AS DIRECTED (Patient not taking: Reported on 09/27/2021) 100 each 0  ? ?No current facility-administered medications for this visit.  ? ? ?Allergies:   Patient has no known allergies.  ? ?Social History:  The patient  reports that he has never smoked. He has never used smokeless tobacco. He reports that he does not currently use alcohol. He reports that he  does not use drugs.  ? ?Family History:   family history includes Diabetes in his brother; Heart failure in his mother; Kidney failure in his mother; Other in his father.  ? ? ?Review of Systems: ?Review of Systems  ?Constitutional: Negative.   ?HENT: Negative.    ?Respiratory: Negative.    ?Cardiovascular: Negative.   ?Gastrointestinal: Negative.   ?Musculoskeletal: Negative.   ?Neurological: Negative.   ?Psychiatric/Behavioral: Negative.    ?All other systems reviewed and are negative. ? ? ?PHYSICAL EXAM: ?VS:  BP 120/60 (BP Location: Left Arm, Patient Position: Sitting, Cuff Size: Normal)   Pulse 80   Ht 5\' 6"  (1.676 m)   Wt 68.2 kg   SpO2 98%   BMI 24.25 kg/m?  , BMI Body  mass index is 24.25 kg/m?Marland Kitchen ?Constitutional:  oriented to person, place, and time. No distress.  ?HENT:  ?Head: Grossly normal ?Eyes:  no discharge. No scleral icterus.  ?Neck: No JVD, no carotid bruits  ?Card

## 2021-09-27 ENCOUNTER — Encounter: Payer: Self-pay | Admitting: Vascular Surgery

## 2021-09-27 ENCOUNTER — Encounter: Payer: Self-pay | Admitting: Cardiovascular Disease

## 2021-09-27 ENCOUNTER — Ambulatory Visit (INDEPENDENT_AMBULATORY_CARE_PROVIDER_SITE_OTHER): Payer: Medicare Other | Admitting: Cardiovascular Disease

## 2021-09-27 VITALS — BP 120/60 | HR 80 | Ht 66.0 in | Wt 150.2 lb

## 2021-09-27 DIAGNOSIS — E782 Mixed hyperlipidemia: Secondary | ICD-10-CM

## 2021-09-27 DIAGNOSIS — I5020 Unspecified systolic (congestive) heart failure: Secondary | ICD-10-CM

## 2021-09-27 DIAGNOSIS — I25118 Atherosclerotic heart disease of native coronary artery with other forms of angina pectoris: Secondary | ICD-10-CM

## 2021-09-27 DIAGNOSIS — I5042 Chronic combined systolic (congestive) and diastolic (congestive) heart failure: Secondary | ICD-10-CM

## 2021-09-27 DIAGNOSIS — E1122 Type 2 diabetes mellitus with diabetic chronic kidney disease: Secondary | ICD-10-CM

## 2021-09-27 DIAGNOSIS — I110 Hypertensive heart disease with heart failure: Secondary | ICD-10-CM

## 2021-09-27 DIAGNOSIS — N186 End stage renal disease: Secondary | ICD-10-CM | POA: Diagnosis not present

## 2021-09-27 DIAGNOSIS — Z794 Long term (current) use of insulin: Secondary | ICD-10-CM

## 2021-09-27 DIAGNOSIS — Z992 Dependence on renal dialysis: Secondary | ICD-10-CM

## 2021-09-27 DIAGNOSIS — I5022 Chronic systolic (congestive) heart failure: Secondary | ICD-10-CM

## 2021-09-27 DIAGNOSIS — N184 Chronic kidney disease, stage 4 (severe): Secondary | ICD-10-CM

## 2021-09-27 NOTE — Patient Instructions (Addendum)
Medication Instructions:  ?Stop bisoprolol ?Continue coreg  ? ?Continue to hold the eliquis until after the surgery ?Ask the surgeon when to restart eliquis ? ?If you need a refill on your cardiac medications before your next appointment, please call your pharmacy.  ? ?Lab work: ?No new labs needed ? ?Testing/Procedures: ?No new testing needed ? ?Follow-Up: ?At [facility], you and your health needs are our priority.  As part of our continuing mission to provide you with exceptional heart care, we have created designated Provider Care Teams.  These Care Teams include your primary Cardiologist (physician) and Advanced Practice Providers (APPs -  Physician Assistants and Nurse Practitioners) who all work together to provide you with the care you need, when you need it. ? ?You will need a follow up appointment in 12 months ? ?Providers on your designated Care Team:   ?Murray Hodgkins, NP ?Christell Faith, PA-C ?Cadence Kathlen Mody, PA-C ? ?COVID-19 Vaccine Information can be found at: [email or web address].[email or web address] For questions related to vaccine distribution or appointments, please email [email or web address] or call [phone].  ? ?

## 2021-09-28 MED ORDER — ORAL CARE MOUTH RINSE
15.0000 mL | Freq: Once | OROMUCOSAL | Status: AC
  Administered 2021-09-29: 15 mL via OROMUCOSAL

## 2021-09-28 MED ORDER — CHLORHEXIDINE GLUCONATE 0.12 % MT SOLN: 15.0000 mL | Freq: Once | OROMUCOSAL | Status: AC

## 2021-09-28 MED ORDER — SODIUM CHLORIDE 0.9 % IV SOLN: INTRAVENOUS | Status: DC

## 2021-09-28 MED ORDER — CEFAZOLIN SODIUM-DEXTROSE 2-4 GM/100ML-% IV SOLN
2.0000 g | INTRAVENOUS | Status: AC
  Administered 2021-09-29: 2 g via INTRAVENOUS

## 2021-09-28 MED ORDER — CHLORHEXIDINE GLUCONATE CLOTH 2 % EX PADS: 6.0000 | MEDICATED_PAD | Freq: Once | CUTANEOUS | Status: DC

## 2021-09-28 MED ORDER — FAMOTIDINE 20 MG PO TABS: 20.0000 mg | ORAL_TABLET | Freq: Once | ORAL | Status: AC

## 2021-09-29 ENCOUNTER — Other Ambulatory Visit: Payer: Self-pay

## 2021-09-29 ENCOUNTER — Encounter
Admission: RE | Disposition: A | Discharge status: Home / Self Care | Payer: Self-pay | Source: Ambulatory Visit | Attending: Vascular Surgery

## 2021-09-29 ENCOUNTER — Ambulatory Visit: Payer: Medicare Other | Admitting: Urgent Care

## 2021-09-29 ENCOUNTER — Encounter: Payer: Self-pay | Admitting: Vascular Surgery

## 2021-09-29 ENCOUNTER — Ambulatory Visit
Admission: RE | Admit: 2021-09-29 | Discharge: 2021-09-29 | Disposition: A | Discharge status: Home / Self Care | Payer: Medicare Other | Source: Ambulatory Visit | Attending: Vascular Surgery | Admitting: Vascular Surgery

## 2021-09-29 ENCOUNTER — Ambulatory Visit: Payer: Medicare Other

## 2021-09-29 DIAGNOSIS — Z992 Dependence on renal dialysis: Secondary | ICD-10-CM | POA: Diagnosis not present

## 2021-09-29 DIAGNOSIS — Z8673 Personal history of transient ischemic attack (TIA), and cerebral infarction without residual deficits: Secondary | ICD-10-CM | POA: Insufficient documentation

## 2021-09-29 DIAGNOSIS — Z8249 Family history of ischemic heart disease and other diseases of the circulatory system: Secondary | ICD-10-CM | POA: Insufficient documentation

## 2021-09-29 DIAGNOSIS — Z794 Long term (current) use of insulin: Secondary | ICD-10-CM | POA: Diagnosis not present

## 2021-09-29 DIAGNOSIS — I252 Old myocardial infarction: Secondary | ICD-10-CM | POA: Insufficient documentation

## 2021-09-29 DIAGNOSIS — Z8616 Personal history of COVID-19: Secondary | ICD-10-CM | POA: Diagnosis not present

## 2021-09-29 DIAGNOSIS — I2510 Atherosclerotic heart disease of native coronary artery without angina pectoris: Secondary | ICD-10-CM | POA: Diagnosis not present

## 2021-09-29 DIAGNOSIS — E1122 Type 2 diabetes mellitus with diabetic chronic kidney disease: Secondary | ICD-10-CM | POA: Diagnosis present

## 2021-09-29 DIAGNOSIS — N186 End stage renal disease: Secondary | ICD-10-CM

## 2021-09-29 DIAGNOSIS — E785 Hyperlipidemia, unspecified: Secondary | ICD-10-CM | POA: Insufficient documentation

## 2021-09-29 DIAGNOSIS — Z833 Family history of diabetes mellitus: Secondary | ICD-10-CM | POA: Diagnosis not present

## 2021-09-29 DIAGNOSIS — I132 Hypertensive heart and chronic kidney disease with heart failure and with stage 5 chronic kidney disease, or end stage renal disease: Secondary | ICD-10-CM | POA: Diagnosis not present

## 2021-09-29 DIAGNOSIS — I5022 Chronic systolic (congestive) heart failure: Secondary | ICD-10-CM | POA: Diagnosis not present

## 2021-09-29 DIAGNOSIS — Z79899 Other long term (current) drug therapy: Secondary | ICD-10-CM | POA: Insufficient documentation

## 2021-09-29 DIAGNOSIS — I6522 Occlusion and stenosis of left carotid artery: Secondary | ICD-10-CM | POA: Diagnosis not present

## 2021-09-29 HISTORY — DX: Type 1 diabetes mellitus without complications: E10.9

## 2021-09-29 HISTORY — DX: Long term (current) use of insulin: Z79.4

## 2021-09-29 HISTORY — DX: End stage renal disease: Z99.2

## 2021-09-29 HISTORY — DX: Hyperlipidemia, unspecified: E78.5

## 2021-09-29 HISTORY — DX: Long term (current) use of anticoagulants: Z79.01

## 2021-09-29 HISTORY — PX: AV FISTULA PLACEMENT: SHX1204

## 2021-09-29 HISTORY — DX: End stage renal disease: N18.6

## 2021-09-29 HISTORY — DX: Suicidal ideations: R45.851

## 2021-09-29 HISTORY — DX: Type 2 diabetes mellitus without complications: E11.9

## 2021-09-29 HISTORY — DX: Depression, unspecified: F32.A

## 2021-09-29 HISTORY — DX: Anxiety disorder, unspecified: F41.9

## 2021-09-29 LAB — POCT I-STAT, CHEM 8
BUN: 32 mg/dL — ABNORMAL HIGH (ref 6–20)
Calcium, Ion: 1.23 mmol/L (ref 1.15–1.40)
Chloride: 102 mmol/L (ref 98–111)
Creatinine, Ser: 3.9 mg/dL — ABNORMAL HIGH (ref 0.61–1.24)
Glucose, Bld: 240 mg/dL — ABNORMAL HIGH (ref 70–99)
HCT: 32 % — ABNORMAL LOW (ref 39.0–52.0)
Hemoglobin: 10.9 g/dL — ABNORMAL LOW (ref 13.0–17.0)
Potassium: 4.4 mmol/L (ref 3.5–5.1)
Sodium: 139 mmol/L (ref 135–145)
TCO2: 27 mmol/L (ref 22–32)

## 2021-09-29 LAB — TYPE AND SCREEN
ABO/RH(D): B POS
Antibody Screen: NEGATIVE

## 2021-09-29 LAB — GLUCOSE, CAPILLARY: Glucose-Capillary: 186 mg/dL — ABNORMAL HIGH (ref 70–99)

## 2021-09-29 LAB — ABO/RH: ABO/RH(D): B POS

## 2021-09-29 SURGERY — ARTERIOVENOUS (AV) FISTULA CREATION
Anesthesia: Monitor Anesthesia Care | Site: Arm Lower | Laterality: Left

## 2021-09-29 MED ORDER — BUPIVACAINE HCL (PF) 0.5 % IJ SOLN
INTRAMUSCULAR | Status: AC
  Filled 2021-09-29: qty 10

## 2021-09-29 MED ORDER — INSULIN ASPART 100 UNIT/ML IJ SOLN
INTRAMUSCULAR | Status: AC
  Filled 2021-09-29: qty 1

## 2021-09-29 MED ORDER — SODIUM CHLORIDE FLUSH 0.9 % IV SOLN
INTRAVENOUS | Status: AC
  Filled 2021-09-29: qty 10

## 2021-09-29 MED ORDER — LIDOCAINE HCL (PF) 1 % IJ SOLN
INTRAMUSCULAR | Status: AC
  Filled 2021-09-29: qty 2

## 2021-09-29 MED ORDER — BUPIVACAINE HCL (PF) 0.5 % IJ SOLN
INTRAMUSCULAR | Status: AC
  Filled 2021-09-29: qty 20

## 2021-09-29 MED ORDER — MIDAZOLAM HCL 2 MG/2ML IJ SOLN
INTRAMUSCULAR | Status: AC
  Filled 2021-09-29: qty 2

## 2021-09-29 MED ORDER — HEPARIN SODIUM (PORCINE) 5000 UNIT/ML IJ SOLN
INTRAMUSCULAR | Status: DC | PRN
  Administered 2021-09-29: 5000 [IU]

## 2021-09-29 MED ORDER — HEPARIN SODIUM (PORCINE) 5000 UNIT/ML IJ SOLN
INTRAMUSCULAR | Status: AC
  Filled 2021-09-29: qty 1

## 2021-09-29 MED ORDER — LIDOCAINE HCL (PF) 2 % IJ SOLN
INTRAMUSCULAR | Status: AC
  Filled 2021-09-29: qty 5

## 2021-09-29 MED ORDER — SODIUM CHLORIDE 0.9 % IV SOLN
INTRAVENOUS | Status: AC | PRN
  Administered 2021-09-29: 500 mL

## 2021-09-29 MED ORDER — INSULIN ASPART 100 UNIT/ML IJ SOLN
5.0000 [IU] | Freq: Once | INTRAMUSCULAR | Status: AC
  Administered 2021-09-29: 5 [IU] via SUBCUTANEOUS

## 2021-09-29 MED ORDER — BUPIVACAINE HCL (PF) 0.5 % IJ SOLN
INTRAMUSCULAR | Status: DC | PRN
  Administered 2021-09-29: 20 mL via PERINEURAL

## 2021-09-29 MED ORDER — HYDROCODONE-ACETAMINOPHEN 5-325 MG PO TABS: 2.0000 | ORAL_TABLET | Freq: Four times a day (QID) | ORAL | 0 refills | Status: DC | PRN

## 2021-09-29 MED ORDER — PROPOFOL 500 MG/50ML IV EMUL
INTRAVENOUS | Status: DC | PRN
  Administered 2021-09-29: 60 ug/kg/min via INTRAVENOUS

## 2021-09-29 MED ORDER — PROPOFOL 500 MG/50ML IV EMUL
INTRAVENOUS | Status: AC
  Filled 2021-09-29: qty 50

## 2021-09-29 MED ORDER — LIDOCAINE HCL (CARDIAC) PF 100 MG/5ML IV SOSY
PREFILLED_SYRINGE | INTRAVENOUS | Status: DC | PRN
Start: 2021-09-29 — End: 2021-09-29
  Administered 2021-09-29: 40 mg via INTRAVENOUS

## 2021-09-29 MED ORDER — FAMOTIDINE 20 MG PO TABS
ORAL_TABLET | ORAL | Status: AC
Start: 2021-09-29 — End: 2021-09-29
  Administered 2021-09-29: 20 mg via ORAL
  Filled 2021-09-29: qty 1

## 2021-09-29 MED ORDER — HEPARIN SODIUM (PORCINE) 1000 UNIT/ML IJ SOLN
INTRAMUSCULAR | Status: AC
  Filled 2021-09-29: qty 10

## 2021-09-29 MED ORDER — BUPIVACAINE-EPINEPHRINE (PF) 0.5% -1:200000 IJ SOLN
INTRAMUSCULAR | Status: AC
  Filled 2021-09-29: qty 30

## 2021-09-29 MED ORDER — HEPARIN SODIUM (PORCINE) 1000 UNIT/ML IJ SOLN
INTRAMUSCULAR | Status: DC | PRN
  Administered 2021-09-29: 3000 [IU] via INTRAVENOUS

## 2021-09-29 MED ORDER — BUPIVACAINE HCL (PF) 0.5 % IJ SOLN
INTRAMUSCULAR | Status: AC
  Filled 2021-09-29: qty 30

## 2021-09-29 MED ORDER — LIDOCAINE HCL (PF) 1 % IJ SOLN
INTRAMUSCULAR | Status: DC | PRN
  Administered 2021-09-29: 1 mL via SUBCUTANEOUS

## 2021-09-29 MED ORDER — MIDAZOLAM HCL 2 MG/2ML IJ SOLN
INTRAMUSCULAR | Status: AC
Start: 2021-09-29 — End: 2021-09-29
  Administered 2021-09-29: 1 mg
  Filled 2021-09-29: qty 2

## 2021-09-29 MED ORDER — CEFAZOLIN SODIUM-DEXTROSE 2-4 GM/100ML-% IV SOLN
INTRAVENOUS | Status: AC
  Filled 2021-09-29: qty 100

## 2021-09-29 SURGICAL SUPPLY — 57 items
ADH SKN CLS APL DERMABOND .7 (GAUZE/BANDAGES/DRESSINGS) ×1
APL PRP STRL LF DISP 70% ISPRP (MISCELLANEOUS) ×1
BAG DECANTER FOR FLEXI CONT (MISCELLANEOUS) ×2 IMPLANT
BLADE SURG SZ11 CARB STEEL (BLADE) ×2 IMPLANT
BOOT SUTURE AID YELLOW STND (SUTURE) ×2 IMPLANT
BRUSH SCRUB EZ  4% CHG (MISCELLANEOUS) ×2
BRUSH SCRUB EZ 4% CHG (MISCELLANEOUS) ×1 IMPLANT
CHLORAPREP W/TINT 26 (MISCELLANEOUS) ×2 IMPLANT
CLIP SPRNG 6 S-JAW DBL (CLIP) ×1 IMPLANT
CLIP SPRNG 6MM S-JAW DBL (CLIP) ×2
DERMABOND ADVANCED (GAUZE/BANDAGES/DRESSINGS) ×1
DERMABOND ADVANCED .7 DNX12 (GAUZE/BANDAGES/DRESSINGS) ×1 IMPLANT
ELECT CAUTERY BLADE 6.4 (BLADE) ×2 IMPLANT
ELECT REM PT RETURN 9FT ADLT (ELECTROSURGICAL) ×2
ELECTRODE REM PT RTRN 9FT ADLT (ELECTROSURGICAL) ×1 IMPLANT
GEL ULTRASOUND 20GR AQUASONIC (MISCELLANEOUS) IMPLANT
GLOVE SURG SYN 7.0 (GLOVE) ×2 IMPLANT
GLOVE SURG SYN 7.0 PF PI (GLOVE) ×1 IMPLANT
GOWN STRL REUS W/ TWL LRG LVL3 (GOWN DISPOSABLE) ×1 IMPLANT
GOWN STRL REUS W/ TWL XL LVL3 (GOWN DISPOSABLE) ×2 IMPLANT
GOWN STRL REUS W/TWL LRG LVL3 (GOWN DISPOSABLE) ×2
GOWN STRL REUS W/TWL XL LVL3 (GOWN DISPOSABLE) ×4
HEMOSTAT SURGICEL 2X3 (HEMOSTASIS) ×2 IMPLANT
IV NS 500ML (IV SOLUTION) ×2
IV NS 500ML BAXH (IV SOLUTION) ×1 IMPLANT
KIT TURNOVER KIT A (KITS) ×2 IMPLANT
LABEL OR SOLS (LABEL) ×2 IMPLANT
LOOP RED MAXI  1X406MM (MISCELLANEOUS) ×1
LOOP VESSEL MAXI  1X406 RED (MISCELLANEOUS) ×1
LOOP VESSEL MAXI 1X406 RED (MISCELLANEOUS) ×1 IMPLANT
LOOP VESSEL MINI 0.8X406 BLUE (MISCELLANEOUS) ×1 IMPLANT
LOOPS BLUE MINI 0.8X406MM (MISCELLANEOUS) ×1
MANIFOLD NEPTUNE II (INSTRUMENTS) ×2 IMPLANT
NDL FILTER BLUNT 18X1 1/2 (NEEDLE) ×1 IMPLANT
NEEDLE FILTER BLUNT 18X 1/2SAF (NEEDLE) ×1
NEEDLE FILTER BLUNT 18X1 1/2 (NEEDLE) ×1 IMPLANT
NS IRRIG 500ML POUR BTL (IV SOLUTION) ×2 IMPLANT
PACK EXTREMITY ARMC (MISCELLANEOUS) ×2 IMPLANT
PAD PREP 24X41 OB/GYN DISP (PERSONAL CARE ITEMS) ×2 IMPLANT
SLING ARM M TX990204 (SOFTGOODS) ×1 IMPLANT
SOLUTION CELL SAVER (CLIP) ×1 IMPLANT
STOCKINETTE 48X4 2 PLY STRL (GAUZE/BANDAGES/DRESSINGS) ×1 IMPLANT
STOCKINETTE STRL 4IN 9604848 (GAUZE/BANDAGES/DRESSINGS) ×2 IMPLANT
SUT MNCRL AB 4-0 PS2 18 (SUTURE) ×2 IMPLANT
SUT PROLENE 6 0 BV (SUTURE) ×8 IMPLANT
SUT SILK 2 0 (SUTURE) ×2
SUT SILK 2-0 18XBRD TIE 12 (SUTURE) ×1 IMPLANT
SUT SILK 3 0 (SUTURE) ×2
SUT SILK 3-0 18XBRD TIE 12 (SUTURE) ×1 IMPLANT
SUT SILK 4 0 (SUTURE) ×2
SUT SILK 4-0 18XBRD TIE 12 (SUTURE) ×1 IMPLANT
SUT VIC AB 3-0 SH 27 (SUTURE) ×2
SUT VIC AB 3-0 SH 27X BRD (SUTURE) ×1 IMPLANT
SYR 20ML LL LF (SYRINGE) ×2 IMPLANT
SYR 3ML LL SCALE MARK (SYRINGE) ×2 IMPLANT
SYR TB 1ML 27GX1/2 LL (SYRINGE) IMPLANT
WATER STERILE IRR 500ML POUR (IV SOLUTION) ×2 IMPLANT

## 2021-09-29 NOTE — Anesthesia Procedure Notes (Signed)
Anesthesia Regional Block: Supraclavicular block  ? ?Pre-Anesthetic Checklist: , timeout performed,  Correct Patient, Correct Site, Correct Laterality,  Correct Procedure, Correct Position, site marked,  Risks and benefits discussed,  Surgical consent,  Pre-op evaluation,  At surgeon's request and post-op pain management ? ?Laterality: Upper and Left ? ?Prep: chloraprep     ?  ?Needles:  ?Injection technique: Single-shot ? ?Needle Type: Stimiplex   ? ? ?Needle Length: 9cm  ?Needle Gauge: 22  ? ? ? ?Additional Needles: ? ? ?Procedures:,,,, ultrasound used (permanent image in chart),,    ?Narrative:  ?Start time: 09/29/2021 12:25 PM ?End time: 09/29/2021 12:27 PM ?Injection made incrementally with aspirations every 20 mL. ? ?Performed by: Personally  ?Anesthesiologist: Iran Ouch, MD ? ?Additional Notes: ?Patient consented for risk and benefits of nerve block including but not limited to nerve damage, failed block, bleeding and infection.  Patient voiced understanding. ? ?Functioning IV was confirmed and monitors were applied.  Timeout done prior to procedure and prior to any sedation being given to the patient.  Patient confirmed procedure site prior to any sedation given to the patient.  A 8mm 22ga Stimuplex needle was used. Sterile prep,hand hygiene and sterile gloves were used.  Minimal sedation used for procedure.  No paresthesia endorsed by patient during the procedure.  Negative aspiration and negative test dose prior to incremental administration of local anesthetic. The patient tolerated the procedure well with no immediate complications. ? ? ? ?

## 2021-09-29 NOTE — Anesthesia Preprocedure Evaluation (Addendum)
Anesthesia Evaluation  ?Patient identified by MRN, date of birth, ID band ?Patient awake ? ? ? ?Reviewed: ?Allergy & Precautions, NPO status , Patient's Chart, lab work & pertinent test results, reviewed documented beta blocker date and time  ? ?Airway ?Mallampati: III ? ?TM Distance: >3 FB ?Neck ROM: full ? ? ? Dental ?no notable dental hx. ? ?  ?Pulmonary ? ?  ?Pulmonary exam normal ? ? ? ? ? ? ? Cardiovascular ?hypertension, Pt. on home beta blockers ?+ CAD, + Past MI (06/2020) and +CHF  ?Normal cardiovascular exam ? ?06/2020 Cath: nonobs dzs. e.)  LHC 06/03/2021; EF 45-50%; LVEDP 18 mmHg; nonobstructive CAD. f.) b.)  TTE 08/08/2021: EF 40-45%; global HK; mild LVH; GLS -8.6; mild LA dilation. ? ?Left carotid artery stenosis- Carotid Doppler 40/98/1191: 47-82% LICA stenosis ? ?DVT apixaban ?  ?Neuro/Psych ?PSYCHIATRIC DISORDERS Anxiety TIA (06/2021)  ? GI/Hepatic ?negative GI ROS, Neg liver ROS,   ?Endo/Other  ?diabetes, Type 2, Insulin Dependent10.1 A1C ? Renal/GU ?Dialysis and ESRFRenal disease  ?negative genitourinary ?  ?Musculoskeletal ? ? Abdominal ?Normal abdominal exam  (+)   ?Peds ? Hematology ? ?(+) Blood dyscrasia, anemia ,   ?Anesthesia Other Findings ?Past Medical History: ?No date: Anemia ?No date: Anxiety and depression ?No date: ATN (acute tubular necrosis) ([place]) ?No date: CAD (coronary artery disease) ?    Comment:  a.) R/LHC 07/19/2020: 20% pRCA, 30% mRCA, 50% dRCA, 50%  ?             RPAV, 40% pLAD, 70% D1, 60% mLAD; LVEDP 19 mmHg; PA 37/16 ?             (26 mmHg); PCWP 13 mmHg. b.) LHC 06/03/2021: EF 45-50%;  ?             LVEDP 18 mmHg; 40% pLAD, 60% mLAD, 20% pRCA, 30% mRCA,  ?             70% D1, 30% dRCA, 40% RPDA. ?No date: Chronic HFrEF (heart failure with reduced ejection fraction)  ?(HCC) ?    Comment:  a.) 06/2020 Echo: EF 30-35%, glob HK, GrII DD, nl RV fxn. ?             Mild LAE. b.)  TTE 08/08/2021: EF 40-45%; global HK; mild ?             LVH; GLS  -8.6; mild LA dilation. ?05/2021: DVT of axillary vein, acute left ([place]) ?    Comment:  a.) Tx'd with apixaban ?No date: ESRD (end stage renal disease) on dialysis [facility]) ?    Comment:  a.) M-W-F ?03/06/2020: History of 2019 novel coronavirus disease (COVID-19) ?    Comment:  a.) s/p Tx with monoclonal Ab infusion ?No date: HLD (hyperlipidemia) ?12/23/2020: Hyperkalemia ?No date: Hypertension ?08/18/2021: Left carotid artery stenosis ?    Comment:  a.) Carotid Doppler 95/62/1308: 65-78% LICA stenosis ?No date: Long term current use of anticoagulant ?    Comment:  a.) apixaban for DVT ?No date: NICM (nonischemic cardiomyopathy) ([place]) ?    Comment:  a.) 07/2009 MV: EF 48%; b.) 08/2019 Echo: EF 45-50%.  ?             Global HK. Mod LVH. GrIDD; c.) 06/2020 Echo: EF 30-35%,  ?             glob HK, mild LVH, GrII DD, nl RV fxn, mild LAE; d.)  ?  06/2020 Cath: nonobs dzs. e.)  LHC 06/03/2021; EF 45-50%;  ?             LVEDP 18 mmHg; nonobstructive CAD. f.) b.)  TTE  ?             08/08/2021: EF 40-45%; global HK; mild LVH; GLS -8.6;  ?             mild LA dilation. ?07/15/2020: NSTEMI (non-ST elevated myocardial infarction) ([place]) ?    Comment:  a.) hsTnI trended: 124--> 127--> 176 --> 190 --> 200  ?             ng/L. ?No date: Sepsis ([place]) ?No date: Suicidal ideations ?06/2021: TIA (transient ischemic attack) ?No date: Type 2 diabetes mellitus treated with insulin ([place]) ? ?Past Surgical History: ?No date: APPENDECTOMY ?No date: CARDIAC CATHETERIZATION ?06/02/2021: DIALYSIS/PERMA CATHETER INSERTION; N/A ?    Comment:  Procedure: DIALYSIS/PERMA CATHETER INSERTION;  Surgeon:  ?             Algernon Huxley, MD;  Location: [facility];   ?             Service: Cardiovascular;  Laterality: N/A; ?06/03/2021: LEFT HEART CATH AND CORONARY ANGIOGRAPHY; N/A ?    Comment:  Procedure: LEFT HEART CATH AND CORONARY ANGIOGRAPHY;   ?             Surgeon: Wellington Hampshire, MD;  Location: [facility]  ?             CV LAB;   Service: Cardiovascular;  Laterality: N/A; ?No date: NASAL SINUS SURGERY ?07/19/2020: RIGHT/LEFT HEART CATH AND CORONARY ANGIOGRAPHY; N/A ?    Comment:  Procedure: RIGHT/LEFT HEART CATH AND CORONARY  ?             ANGIOGRAPHY;  Surgeon: Wellington Hampshire, MD;  Location:  ?             [facility];  Service: Cardiovascular;   ?             Laterality: N/A; ? ? ? ? Reproductive/Obstetrics ?negative OB ROS ? ?  ? ? ? ? ? ? ? ? ? ? ? ? ? ?  ?  ? ? ? ? ? ? ?Anesthesia Physical ?Anesthesia Plan ? ?ASA: 3 ? ?Anesthesia Plan: General  ? ?Post-op Pain Management: Regional block*  ? ?Induction: Intravenous ? ?PONV Risk Score and Plan: 2 and Propofol infusion and TIVA ? ?Airway Management Planned: Natural Airway ? ?Additional Equipment:  ? ?Intra-op Plan:  ? ?Post-operative Plan:  ? ?Informed Consent: I have reviewed the patients History and Physical, chart, labs and discussed the procedure including the risks, benefits and alternatives for the proposed anesthesia with the patient or authorized representative who has indicated his/her understanding and acceptance.  ? ? ? ?Dental advisory given ? ?Plan Discussed with: Anesthesiologist, CRNA and Surgeon ? ?Anesthesia Plan Comments:   ? ? ? ? ? ?Anesthesia Quick Evaluation ? ?

## 2021-09-29 NOTE — Anesthesia Postprocedure Evaluation (Signed)
Anesthesia Post Note ? ?Patient: JAKIAH GOREE ? ?Procedure(s) Performed: ARTERIOVENOUS (AV) FISTULA CREATION ( RADIAL CEPHALIC) (Left: Arm Lower) ? ?Patient location during evaluation: PACU ?Anesthesia Type: Regional ?Level of consciousness: awake and alert, oriented and patient cooperative ?Pain management: pain level controlled ?Vital Signs Assessment: post-procedure vital signs reviewed and stable ?Respiratory status: spontaneous breathing, nonlabored ventilation and respiratory function stable ?Cardiovascular status: blood pressure returned to baseline and stable ?Postop Assessment: adequate PO intake ?Anesthetic complications: no ? ? ?No notable events documented. ? ? ?Last Vitals:  ?Vitals:  ? 09/29/21 1432 09/29/21 1445  ?BP: (!) 176/89 (!) 168/82  ?Pulse: 77 72  ?Resp: 15 19  ?Temp:    ?SpO2: 98% 98%  ?  ?Last Pain:  ?Vitals:  ? 09/29/21 1415  ?PainSc: 0-No pain  ? ? ?  ?  ?  ?  ?  ?  ? ?Darrin Nipper ? ? ? ? ?

## 2021-09-29 NOTE — Discharge Instructions (Signed)

## 2021-09-29 NOTE — Interval H&P Note (Signed)
History and Physical Interval Note: ? ?09/29/2021 ?11:08 AM ? ?Jimmy Olson  has presented today for surgery, with the diagnosis of ESRD.  The various methods of treatment have been discussed with the patient and family. After consideration of risks, benefits and other options for treatment, the patient has consented to  Procedure(s): ?ARTERIOVENOUS (AV) FISTULA CREATION ( RADIAL CEPHALIC) (Left) as a surgical intervention.  The patient's history has been reviewed, patient examined, no change in status, stable for surgery.  I have reviewed the patient's chart and labs.  Questions were answered to the patient's satisfaction.   ? ? ?Leotis Pain ? ? ?

## 2021-09-29 NOTE — Op Note (Signed)
[facility] ? ? ?OPERATIVE NOTE ? ? ?PROCEDURE: ?Left radiocephaic arteriovenous fistula placement ? ?PRE-OPERATIVE DIAGNOSIS: 1. ESRD ? ? ?POST-OPERATIVE DIAGNOSIS: Same ? ?SURGEON: Leotis Pain, MD ? ?ASSISTANT(S): none ? ?ANESTHESIA: general ? ?ESTIMATED BLOOD LOSS: 10 cc ? ?FINDING(S): ?none ? ?SPECIMEN(S):  None ? ?INDICATIONS:   ?Jimmy Olson is a 61 y.o. male who presents with renal failure.  The patient is scheduled for left radiocephalic arteriovenous fistula placement when non-invasive studies suggested adequate anatomy for fistula creation at this location.  The patient is aware the risks include but are not limited to: bleeding, infection, steal syndrome, nerve damage, ischemic monomelic neuropathy, failure to mature, and need for additional procedures.  The patient is aware of the risks of the procedure and elects to proceed forward. ? ?DESCRIPTION: ?After full informed written consent was obtained from the patient, the patient was brought back to the operating room and placed supine upon the operating table.  Prior to induction, the patient received IV antibiotics.   After obtaining adequate anesthesia, the patient was then prepped and draped in the standard fashion for a left arm access procedure.  I turned my attention first to identifying the patient's distal cephalic vein and radial artery.  I made an incision at the level of the distal forearm and wrist and dissected through the subcutaneous tissue and fascia to gain exposure of the radial artery.  This was noted to be be useable for fistula creation.  This was dissected out proximally and distally and controlled with vessel loops.  I then dissected out the cephalic vein.  This was noted to be patent and adequate size for fistula creation. I then gave the patient 3000 units of intravenous heparin.  The distal segment of the vein was ligated with a  2-0 silk, and the vein was transected.  I then instilled the heparinized saline  into the vein and clamped it.  At this point, I reset my exposure of the radial artery and placed the artery under tension proximally and distally.  I made an arteriotomy with a #11 blade, and then I extended the arteriotomy with a Potts scissor.  I injected heparinized saline proximal and distal to this arteriotomy.  The vein was then sewn to the artery in an end-to-side configuration with a running stitch of 6-0 Prolene.  Prior to completing this anastomosis, I allowed the vein and artery to backbleed.  There was no evidence of clot from any vessels.  I completed the anastomosis in the usual fashion and then released all vessel loops and clamps.  There was a palpable  thrill in the venous outflow, and there was a palpable radial pulse beyond the anastomosis.  At this point, I irrigated out the surgical wound. Surgicel was placed. There was no further active bleeding.  The subcutaneous tissue was reapproximated with a running stitch of 3-0 Vicryl.  The skin was then reapproximated with a running subcuticular stitch of 4-0 Monocryl.  The skin was then cleaned, dried, and reinforced with Dermabond.  The patient tolerated this procedure well and was taken to the recovery room in stable condition ? ?COMPLICATIONS: None ? ?CONDITION: Stable ? ? ?Leotis Pain, MD ?09/29/2021 ?2:47 PM ? ? ?This note was created with Dragon Medical transcription system. Any errors in dictation are purely unintentional.  ?

## 2021-09-29 NOTE — Transfer of Care (Signed)
Immediate Anesthesia Transfer of Care Note ? ?Patient: Jimmy Olson ? ?Procedure(s) Performed: ARTERIOVENOUS (AV) FISTULA CREATION ( RADIAL CEPHALIC) (Left: Arm Lower) ? ?Patient Location: PACU ? ?Anesthesia Type:MAC and Regional ? ?Level of Consciousness: awake, alert  and oriented ? ?Airway & Oxygen Therapy: Patient Spontanous Breathing and Patient connected to face mask oxygen ? ?Post-op Assessment: Report given to RN, Post -op Vital signs reviewed and stable and Patient moving all extremities ? ?Post vital signs: Reviewed and stable ? ?Last Vitals:  ?Vitals Value Taken Time  ?BP 170/86 09/29/21 1415  ?Temp 36.1 ?C 09/29/21 1415  ?Pulse 72 09/29/21 1423  ?Resp 21 09/29/21 1423  ?SpO2 99 % 09/29/21 1423  ? ? ?Last Pain:  ?Vitals:  ? 09/29/21 1415  ?PainSc: 0-No pain  ?   ? ?  ? ?Complications: No notable events documented. ?

## 2021-09-30 ENCOUNTER — Encounter: Payer: Self-pay | Admitting: Vascular Surgery

## 2021-10-01 ENCOUNTER — Inpatient Hospital Stay: Payer: Medicare Other

## 2021-10-01 ENCOUNTER — Inpatient Hospital Stay
Admission: EM | Admit: 2021-10-01 | Discharge: 2021-10-08 | DRG: 252 | Disposition: A | Discharge status: Home / Self Care | Payer: Medicare Other | Attending: Internal Medicine | Admitting: Internal Medicine

## 2021-10-01 ENCOUNTER — Emergency Department: Payer: Medicare Other

## 2021-10-01 ENCOUNTER — Other Ambulatory Visit: Payer: Self-pay

## 2021-10-01 DIAGNOSIS — Z7901 Long term (current) use of anticoagulants: Secondary | ICD-10-CM

## 2021-10-01 DIAGNOSIS — I25118 Atherosclerotic heart disease of native coronary artery with other forms of angina pectoris: Secondary | ICD-10-CM | POA: Diagnosis not present

## 2021-10-01 DIAGNOSIS — E1169 Type 2 diabetes mellitus with other specified complication: Secondary | ICD-10-CM | POA: Diagnosis present

## 2021-10-01 DIAGNOSIS — E785 Hyperlipidemia, unspecified: Secondary | ICD-10-CM | POA: Diagnosis present

## 2021-10-01 DIAGNOSIS — Z79899 Other long term (current) drug therapy: Secondary | ICD-10-CM

## 2021-10-01 DIAGNOSIS — N186 End stage renal disease: Secondary | ICD-10-CM | POA: Diagnosis present

## 2021-10-01 DIAGNOSIS — I639 Cerebral infarction, unspecified: Secondary | ICD-10-CM | POA: Diagnosis not present

## 2021-10-01 DIAGNOSIS — Z8616 Personal history of COVID-19: Secondary | ICD-10-CM

## 2021-10-01 DIAGNOSIS — Z992 Dependence on renal dialysis: Secondary | ICD-10-CM

## 2021-10-01 DIAGNOSIS — Z833 Family history of diabetes mellitus: Secondary | ICD-10-CM

## 2021-10-01 DIAGNOSIS — I161 Hypertensive emergency: Secondary | ICD-10-CM | POA: Diagnosis present

## 2021-10-01 DIAGNOSIS — Z86718 Personal history of other venous thrombosis and embolism: Secondary | ICD-10-CM | POA: Diagnosis not present

## 2021-10-01 DIAGNOSIS — E1122 Type 2 diabetes mellitus with diabetic chronic kidney disease: Secondary | ICD-10-CM | POA: Diagnosis present

## 2021-10-01 DIAGNOSIS — I998 Other disorder of circulatory system: Secondary | ICD-10-CM

## 2021-10-01 DIAGNOSIS — F419 Anxiety disorder, unspecified: Secondary | ICD-10-CM | POA: Diagnosis present

## 2021-10-01 DIAGNOSIS — R109 Unspecified abdominal pain: Secondary | ICD-10-CM | POA: Clinically undetermined

## 2021-10-01 DIAGNOSIS — I5022 Chronic systolic (congestive) heart failure: Secondary | ICD-10-CM | POA: Diagnosis present

## 2021-10-01 DIAGNOSIS — R079 Chest pain, unspecified: Secondary | ICD-10-CM | POA: Diagnosis present

## 2021-10-01 DIAGNOSIS — M7989 Other specified soft tissue disorders: Secondary | ICD-10-CM | POA: Diagnosis present

## 2021-10-01 DIAGNOSIS — I69398 Other sequelae of cerebral infarction: Secondary | ICD-10-CM | POA: Diagnosis not present

## 2021-10-01 DIAGNOSIS — I248 Other forms of acute ischemic heart disease: Secondary | ICD-10-CM | POA: Diagnosis present

## 2021-10-01 DIAGNOSIS — R29898 Other symptoms and signs involving the musculoskeletal system: Secondary | ICD-10-CM

## 2021-10-01 DIAGNOSIS — R1010 Upper abdominal pain, unspecified: Secondary | ICD-10-CM | POA: Diagnosis not present

## 2021-10-01 DIAGNOSIS — Y832 Surgical operation with anastomosis, bypass or graft as the cause of abnormal reaction of the patient, or of later complication, without mention of misadventure at the time of the procedure: Secondary | ICD-10-CM | POA: Diagnosis present

## 2021-10-01 DIAGNOSIS — M79602 Pain in left arm: Secondary | ICD-10-CM | POA: Diagnosis not present

## 2021-10-01 DIAGNOSIS — E11649 Type 2 diabetes mellitus with hypoglycemia without coma: Secondary | ICD-10-CM | POA: Diagnosis not present

## 2021-10-01 DIAGNOSIS — E440 Moderate protein-calorie malnutrition: Secondary | ICD-10-CM | POA: Insufficient documentation

## 2021-10-01 DIAGNOSIS — I10 Essential (primary) hypertension: Secondary | ICD-10-CM | POA: Diagnosis not present

## 2021-10-01 DIAGNOSIS — I5020 Unspecified systolic (congestive) heart failure: Secondary | ICD-10-CM | POA: Diagnosis present

## 2021-10-01 DIAGNOSIS — K30 Functional dyspepsia: Secondary | ICD-10-CM | POA: Diagnosis present

## 2021-10-01 DIAGNOSIS — R739 Hyperglycemia, unspecified: Secondary | ICD-10-CM

## 2021-10-01 DIAGNOSIS — I2510 Atherosclerotic heart disease of native coronary artery without angina pectoris: Secondary | ICD-10-CM | POA: Diagnosis present

## 2021-10-01 DIAGNOSIS — I252 Old myocardial infarction: Secondary | ICD-10-CM | POA: Diagnosis not present

## 2021-10-01 DIAGNOSIS — Z794 Long term (current) use of insulin: Secondary | ICD-10-CM

## 2021-10-01 DIAGNOSIS — T82898A Other specified complication of vascular prosthetic devices, implants and grafts, initial encounter: Principal | ICD-10-CM | POA: Diagnosis present

## 2021-10-01 DIAGNOSIS — F32A Depression, unspecified: Secondary | ICD-10-CM | POA: Clinically undetermined

## 2021-10-01 DIAGNOSIS — I132 Hypertensive heart and chronic kidney disease with heart failure and with stage 5 chronic kidney disease, or end stage renal disease: Secondary | ICD-10-CM | POA: Diagnosis present

## 2021-10-01 DIAGNOSIS — E1165 Type 2 diabetes mellitus with hyperglycemia: Secondary | ICD-10-CM | POA: Diagnosis present

## 2021-10-01 DIAGNOSIS — D631 Anemia in chronic kidney disease: Secondary | ICD-10-CM | POA: Diagnosis present

## 2021-10-01 DIAGNOSIS — Z841 Family history of disorders of kidney and ureter: Secondary | ICD-10-CM

## 2021-10-01 DIAGNOSIS — J9601 Acute respiratory failure with hypoxia: Secondary | ICD-10-CM | POA: Diagnosis present

## 2021-10-01 DIAGNOSIS — N2581 Secondary hyperparathyroidism of renal origin: Secondary | ICD-10-CM | POA: Diagnosis present

## 2021-10-01 DIAGNOSIS — Z8249 Family history of ischemic heart disease and other diseases of the circulatory system: Secondary | ICD-10-CM

## 2021-10-01 DIAGNOSIS — I214 Non-ST elevation (NSTEMI) myocardial infarction: Secondary | ICD-10-CM

## 2021-10-01 DIAGNOSIS — Z7902 Long term (current) use of antithrombotics/antiplatelets: Secondary | ICD-10-CM

## 2021-10-01 DIAGNOSIS — K219 Gastro-esophageal reflux disease without esophagitis: Secondary | ICD-10-CM | POA: Diagnosis not present

## 2021-10-01 LAB — TROPONIN I (HIGH SENSITIVITY): Troponin I (High Sensitivity): 210 ng/L (ref <18)

## 2021-10-01 LAB — CBC WITH DIFFERENTIAL/PLATELET
Abs Immature Granulocytes: 0.02 10*3/uL (ref 0.00–0.07)
Basophils Absolute: 0 10*3/uL (ref 0.0–0.1)
Basophils Relative: 0 %
Eosinophils Absolute: 0.1 10*3/uL (ref 0.0–0.5)
Eosinophils Relative: 1 %
HCT: 31.6 % — ABNORMAL LOW (ref 39.0–52.0)
Hemoglobin: 10.2 g/dL — ABNORMAL LOW (ref 13.0–17.0)
Immature Granulocytes: 0 %
Lymphocytes Relative: 14 %
Lymphs Abs: 0.7 10*3/uL (ref 0.7–4.0)
MCH: 28.5 pg (ref 26.0–34.0)
MCHC: 32.3 g/dL (ref 30.0–36.0)
MCV: 88.3 fL (ref 80.0–100.0)
Monocytes Absolute: 0.5 10*3/uL (ref 0.1–1.0)
Monocytes Relative: 9 %
Neutro Abs: 4.1 10*3/uL (ref 1.7–7.7)
Neutrophils Relative %: 76 %
Platelets: 176 10*3/uL (ref 150–400)
RBC: 3.58 MIL/uL — ABNORMAL LOW (ref 4.22–5.81)
RDW: 14.4 % (ref 11.5–15.5)
WBC: 5.4 10*3/uL (ref 4.0–10.5)
nRBC: 0 % (ref 0.0–0.2)

## 2021-10-01 LAB — BLOOD GAS, VENOUS
Acid-Base Excess: 1.3 mmol/L (ref 0.0–2.0)
Bicarbonate: 25 mmol/L (ref 20.0–28.0)
O2 Saturation: 99.6 %
Patient temperature: 37
pCO2, Ven: 36 mmHg — ABNORMAL LOW (ref 44–60)
pH, Ven: 7.45 — ABNORMAL HIGH (ref 7.25–7.43)
pO2, Ven: 103 mmHg — ABNORMAL HIGH (ref 32–45)

## 2021-10-01 LAB — BASIC METABOLIC PANEL
Anion gap: 9 (ref 5–15)
BUN: 45 mg/dL — ABNORMAL HIGH (ref 6–20)
CO2: 28 mmol/L (ref 22–32)
Calcium: 9 mg/dL (ref 8.9–10.3)
Chloride: 96 mmol/L — ABNORMAL LOW (ref 98–111)
Creatinine, Ser: 4.06 mg/dL — ABNORMAL HIGH (ref 0.61–1.24)
GFR, Estimated: 16 mL/min — ABNORMAL LOW (ref >60)
Glucose, Bld: 515 mg/dL (ref 70–99)
Potassium: 4.4 mmol/L (ref 3.5–5.1)
Sodium: 133 mmol/L — ABNORMAL LOW (ref 135–145)

## 2021-10-01 LAB — BETA-HYDROXYBUTYRIC ACID: Beta-Hydroxybutyric Acid: 0.09 mmol/L (ref 0.05–0.27)

## 2021-10-01 LAB — LACTIC ACID, PLASMA: Lactic Acid, Venous: 0.8 mmol/L (ref 0.5–1.9)

## 2021-10-01 MED ORDER — HYDROMORPHONE HCL 1 MG/ML IJ SOLN
1.0000 mg | Freq: Once | INTRAMUSCULAR | Status: AC
  Administered 2021-10-01: 1 mg via INTRAVENOUS
  Filled 2021-10-01: qty 1

## 2021-10-01 MED ORDER — ONDANSETRON HCL 4 MG/2ML IJ SOLN
4.0000 mg | Freq: Four times a day (QID) | INTRAMUSCULAR | Status: DC | PRN
Start: 2021-10-01 — End: 2021-10-08
  Administered 2021-10-03: 4 mg via INTRAVENOUS
  Filled 2021-10-01: qty 2

## 2021-10-01 MED ORDER — NICARDIPINE HCL IN NACL 20-0.86 MG/200ML-% IV SOLN
3.0000 mg/h | INTRAVENOUS | Status: DC
  Administered 2021-10-01: 5 mg/h via INTRAVENOUS
  Filled 2021-10-01: qty 200

## 2021-10-01 MED ORDER — SODIUM CHLORIDE 0.9 % IV SOLN: INTRAVENOUS | Status: DC

## 2021-10-01 MED ORDER — DIPHENHYDRAMINE HCL 25 MG PO CAPS
25.0000 mg | ORAL_CAPSULE | Freq: Four times a day (QID) | ORAL | Status: DC | PRN
  Administered 2021-10-02: 25 mg via ORAL
  Filled 2021-10-01 (×3): qty 1

## 2021-10-01 MED ORDER — ROSUVASTATIN CALCIUM 10 MG PO TABS: 10.0000 mg | ORAL_TABLET | Freq: Every day | ORAL | Status: DC

## 2021-10-01 MED ORDER — LABETALOL HCL 5 MG/ML IV SOLN
10.0000 mg | Freq: Once | INTRAVENOUS | Status: AC
  Administered 2021-10-01: 10 mg via INTRAVENOUS
  Filled 2021-10-01: qty 4

## 2021-10-01 MED ORDER — NITROGLYCERIN 0.4 MG SL SUBL
0.4000 mg | SUBLINGUAL_TABLET | SUBLINGUAL | Status: DC | PRN
  Administered 2021-10-01: 0.4 mg via SUBLINGUAL
  Filled 2021-10-01: qty 1

## 2021-10-01 MED ORDER — EZETIMIBE 10 MG PO TABS
10.0000 mg | ORAL_TABLET | Freq: Every day | ORAL | Status: DC
  Administered 2021-10-02 – 2021-10-08 (×7): 10 mg via ORAL
  Filled 2021-10-01 (×7): qty 1

## 2021-10-01 MED ORDER — CARVEDILOL 25 MG PO TABS
25.0000 mg | ORAL_TABLET | Freq: Two times a day (BID) | ORAL | Status: DC
  Administered 2021-10-01 – 2021-10-08 (×14): 25 mg via ORAL
  Filled 2021-10-01: qty 1
  Filled 2021-10-01: qty 2
  Filled 2021-10-01 (×9): qty 1
  Filled 2021-10-01: qty 2
  Filled 2021-10-01: qty 1
  Filled 2021-10-01: qty 2

## 2021-10-01 MED ORDER — SERTRALINE HCL 50 MG PO TABS: 50.0000 mg | ORAL_TABLET | Freq: Every day | ORAL | Status: DC

## 2021-10-01 MED ORDER — IOHEXOL 350 MG/ML SOLN
75.0000 mL | Freq: Once | INTRAVENOUS | Status: AC | PRN
  Administered 2021-10-02: 75 mL via INTRAVENOUS

## 2021-10-01 MED ORDER — HYDRALAZINE HCL 50 MG PO TABS
100.0000 mg | ORAL_TABLET | Freq: Two times a day (BID) | ORAL | Status: DC
  Administered 2021-10-01: 100 mg via ORAL
  Filled 2021-10-01: qty 2

## 2021-10-01 MED ORDER — VANCOMYCIN VARIABLE DOSE PER UNSTABLE RENAL FUNCTION (PHARMACIST DOSING): Status: DC

## 2021-10-01 MED ORDER — SODIUM CHLORIDE 0.9 % IV SOLN
1.0000 g | INTRAVENOUS | Status: DC
  Administered 2021-10-02: 1 g via INTRAVENOUS
  Filled 2021-10-01: qty 1

## 2021-10-01 MED ORDER — INSULIN ASPART 100 UNIT/ML IJ SOLN
0.0000 [IU] | INTRAMUSCULAR | Status: DC
  Administered 2021-10-01: 15 [IU] via SUBCUTANEOUS
  Administered 2021-10-02 (×2): 3 [IU] via SUBCUTANEOUS
  Administered 2021-10-02: 8 [IU] via SUBCUTANEOUS
  Administered 2021-10-03: 3 [IU] via SUBCUTANEOUS
  Filled 2021-10-01 (×4): qty 1

## 2021-10-01 MED ORDER — ACETAMINOPHEN 325 MG PO TABS
650.0000 mg | ORAL_TABLET | ORAL | Status: DC | PRN
  Administered 2021-10-07: 650 mg via ORAL

## 2021-10-01 MED ORDER — ALBUTEROL SULFATE (2.5 MG/3ML) 0.083% IN NEBU: 3.0000 mL | INHALATION_SOLUTION | Freq: Four times a day (QID) | RESPIRATORY_TRACT | Status: DC | PRN

## 2021-10-01 MED ORDER — VANCOMYCIN HCL 1500 MG/300ML IV SOLN
1500.0000 mg | Freq: Once | INTRAVENOUS | Status: AC
  Administered 2021-10-02: 1500 mg via INTRAVENOUS
  Filled 2021-10-01: qty 300

## 2021-10-01 MED ORDER — NITROGLYCERIN 0.4 MG SL SUBL: 0.4000 mg | SUBLINGUAL_TABLET | SUBLINGUAL | Status: DC | PRN

## 2021-10-01 MED ORDER — NITROGLYCERIN 2 % TD OINT
1.0000 [in_us] | TOPICAL_OINTMENT | Freq: Once | TRANSDERMAL | Status: AC
  Administered 2021-10-01: 1 [in_us] via TOPICAL
  Filled 2021-10-01: qty 1

## 2021-10-01 MED ORDER — ASPIRIN 81 MG PO CHEW
324.0000 mg | CHEWABLE_TABLET | Freq: Once | ORAL | Status: AC
  Administered 2021-10-01: 324 mg via ORAL
  Filled 2021-10-01: qty 4

## 2021-10-01 MED ORDER — HEPARIN SODIUM (PORCINE) 5000 UNIT/ML IJ SOLN
5000.0000 [IU] | Freq: Three times a day (TID) | INTRAMUSCULAR | Status: DC
  Administered 2021-10-01 – 2021-10-03 (×5): 5000 [IU] via SUBCUTANEOUS
  Filled 2021-10-01 (×5): qty 1

## 2021-10-01 MED ORDER — MORPHINE SULFATE (PF) 2 MG/ML IV SOLN
2.0000 mg | INTRAVENOUS | Status: DC | PRN
  Administered 2021-10-01 – 2021-10-07 (×8): 2 mg via INTRAVENOUS
  Filled 2021-10-01 (×9): qty 1

## 2021-10-01 MED ORDER — ISOSORBIDE MONONITRATE ER 30 MG PO TB24
30.0000 mg | ORAL_TABLET | Freq: Every day | ORAL | Status: DC
  Administered 2021-10-02 – 2021-10-08 (×7): 30 mg via ORAL
  Filled 2021-10-01 (×8): qty 1

## 2021-10-01 MED ORDER — LABETALOL HCL 5 MG/ML IV SOLN
10.0000 mg | Freq: Once | INTRAVENOUS | Status: AC
  Administered 2021-10-02: 10 mg via INTRAVENOUS
  Filled 2021-10-01: qty 4

## 2021-10-01 NOTE — ED Notes (Signed)
Pt placed on cardiac, BP, pulse ox monitors. ?

## 2021-10-01 NOTE — ED Notes (Addendum)
This RN received critical glucose of 515. This RN notified Dr. Tamala Julian in person. ?

## 2021-10-01 NOTE — ED Triage Notes (Addendum)
Pt c/o left arm pain that is not controlled with hydrocodone since 4 am today. Pt is post procedure for dialysis fistula 2 days ago. Left arm is swollen- nonpitting edema, warm to touch, CMS intact. Pt reports episode of emesis 4 hours ago. Pt is AOS4, NAD noted. Pt denies SOB, CP, dizziness. ?Dialysis port in right chest- pt states will be removed.  ?

## 2021-10-01 NOTE — Consult Note (Signed)
?[facility] ?Vascular Consult Note ? ?MRN : [id number] ? ?Jimmy Olson is a 61 y.o. ([birth year]) male who presents with chief complaint of No chief complaint on file. ?. ? ?History of Present Illness: Patient known to service. S/P LEFT radiocephalic AV fistula creation on 09/29/2021. Via Interpreter: Presents with LEFT arm and hand pain with increasing numbness and tingling in fingers and palm that began late last night. States he took pain medication with some relief. However, the symptoms progressed this evening. Patient was hypertensive SBP 200s with chest pain and shortness of breath in the ER. Troponins 200. History of elevated troponins, LAD, RCA disease, EF 50-60% on cardiac cath. ? ?Current Facility-Administered Medications  ?Medication Dose Route Frequency Provider Last Rate Last Admin  ? carvedilol (COREG) tablet 25 mg  25 mg Oral BID Lucrezia Starch, MD   25 mg at 10/01/21 2009  ? insulin aspart (novoLOG) injection 0-15 Units  0-15 Units Subcutaneous Q4H Lucrezia Starch, MD      ? morphine (PF) 2 MG/ML injection 2 mg  2 mg Intravenous Q3H PRN Para Skeans, MD   2 mg at 10/01/21 2137  ? nitroGLYCERIN (NITROGLYN) 2 % ointment 1 inch  1 inch Topical Once Para Skeans, MD      ? nitroGLYCERIN (NITROSTAT) SL tablet 0.4 mg  0.4 mg Sublingual Q5 min PRN Para Skeans, MD   0.4 mg at 10/01/21 2130  ? ?Current Outpatient Medications  ?Medication Sig Dispense Refill  ? albuterol (PROVENTIL HFA) 108 (90 Base) MCG/ACT inhaler INHALE 2 PUFFS INTO THE LUNGS ONCE EVERY 6 HOURS AS NEEDED FOR WHEEZING OR SHORTNESS OF BREATH. 6.7 g 10  ? apixaban (ELIQUIS) 5 MG TABS tablet TAKE 1 TABLET BY MOUTH TWICE DAILY. (Patient taking differently: Take 5 mg by mouth daily.) 180 tablet 1  ? carvedilol (COREG) 25 MG tablet Take 1 tablet (25 mg total) by mouth 2 (two) times daily. 60 tablet 6  ? ezetimibe (ZETIA) 10 MG tablet Take by mouth.    ? HUMALOG KWIKPEN 100 UNIT/ML KwikPen Inject into the skin 3  (three) times daily. Sliding scale    ? hydrALAZINE (APRESOLINE) 100 MG tablet Take 1 tablet (100 mg total) by mouth once every 8 (eight) hours. (Patient taking differently: Take 100 mg by mouth 2 (two) times daily.) 90 tablet 6  ? HYDROcodone-acetaminophen (NORCO/VICODIN) 5-325 MG tablet Take 2 tablets by mouth every 6 (six) hours as needed for moderate pain. 20 tablet 0  ? insulin detemir (LEVEMIR FLEXTOUCH) 100 UNIT/ML FlexPen Inject 20 Units into the skin once daily at bedtime. (Patient taking differently: Inject 25 Units into the skin at bedtime.) 15 mL 5  ? Insulin Pen Needle 32G X 4 MM MISC USE AS DIRECTED 100 each 0  ? isosorbide mononitrate (IMDUR) 30 MG 24 hr tablet Take 1 tablet (30 mg total) by mouth in the morning and at bedtime. 60 tablet 6  ? nitroGLYCERIN (NITROSTAT) 0.4 MG SL tablet Place 1 tablet (0.4 mg total) under the tongue every 5 (five) minutes as needed for chest pain. 30 tablet 0  ? omeprazole (PRILOSEC) 20 MG capsule Take 20 mg by mouth daily.    ? rosuvastatin (CRESTOR) 40 MG tablet TAKE 1 TABLET BY MOUTH ONCE DAILY. 90 tablet 3  ? sertraline (ZOLOFT) 50 MG tablet Take 50 mg by mouth daily.    ? ? ?Past Medical History:  ?Diagnosis Date  ? Anemia   ? Anxiety and depression   ?  ATN (acute tubular necrosis) (HCC)   ? CAD (coronary artery disease)   ? a.) R/LHC 07/19/2020: 20% pRCA, 30% mRCA, 50% dRCA, 50% RPAV, 40% pLAD, 70% D1, 60% mLAD; LVEDP 19 mmHg; PA 37/16 (26 mmHg); PCWP 13 mmHg. b.) LHC 06/03/2021: EF 45-50%; LVEDP 18 mmHg; 40% pLAD, 60% mLAD, 20% pRCA, 30% mRCA, 70% D1, 30% dRCA, 40% RPDA.  ? Chronic HFrEF (heart failure with reduced ejection fraction) ([place])   ? a.) 06/2020 Echo: EF 30-35%, glob HK, GrII DD, nl RV fxn. Mild LAE. b.)  TTE 08/08/2021: EF 40-45%; global HK; mild LVH; GLS -8.6; mild LA dilation.  ? DVT of axillary vein, acute left ([place]) 05/2021  ? a.) Tx'd with apixaban  ? ESRD (end stage renal disease) on dialysis [facility])   ? a.) M-W-F  ? History of 2019 novel  coronavirus disease (COVID-19) 03/06/2020  ? a.) s/p Tx with monoclonal Ab infusion  ? HLD (hyperlipidemia)   ? Hyperkalemia 12/23/2020  ? Hypertension   ? Left carotid artery stenosis 08/18/2021  ? a.) Carotid Doppler 76/28/3151: 76-16% LICA stenosis  ? Long term current use of anticoagulant   ? a.) apixaban for DVT  ? NICM (nonischemic cardiomyopathy) ([place])   ? a.) 07/2009 MV: EF 48%; b.) 08/2019 Echo: EF 45-50%. Global HK. Mod LVH. GrIDD; c.) 06/2020 Echo: EF 30-35%, glob HK, mild LVH, GrII DD, nl RV fxn, mild LAE; d.) 06/2020 Cath: nonobs dzs. e.)  LHC 06/03/2021; EF 45-50%; LVEDP 18 mmHg; nonobstructive CAD. f.) b.)  TTE 08/08/2021: EF 40-45%; global HK; mild LVH; GLS -8.6; mild LA dilation.  ? NSTEMI (non-ST elevated myocardial infarction) ([place]) 07/15/2020  ? a.) hsTnI trended: 124--> 127--> 176 --> 190 --> 200 ng/L.  ? Sepsis ([place])   ? Suicidal ideations   ? TIA (transient ischemic attack) 06/2021  ? Type 2 diabetes mellitus treated with insulin ([place])   ? ? ?Past Surgical History:  ?Procedure Laterality Date  ? APPENDECTOMY    ? AV FISTULA PLACEMENT Left 09/29/2021  ? Procedure: ARTERIOVENOUS (AV) FISTULA CREATION ( RADIAL CEPHALIC);  Surgeon: Algernon Huxley, MD;  Location: [facility];  Service: Vascular;  Laterality: Left;  ? CARDIAC CATHETERIZATION    ? DIALYSIS/PERMA CATHETER INSERTION N/A 06/02/2021  ? Procedure: DIALYSIS/PERMA CATHETER INSERTION;  Surgeon: Algernon Huxley, MD;  Location: [facility];  Service: Cardiovascular;  Laterality: N/A;  ? LEFT HEART CATH AND CORONARY ANGIOGRAPHY N/A 06/03/2021  ? Procedure: LEFT HEART CATH AND CORONARY ANGIOGRAPHY;  Surgeon: Wellington Hampshire, MD;  Location: [facility];  Service: Cardiovascular;  Laterality: N/A;  ? NASAL SINUS SURGERY    ? RIGHT/LEFT HEART CATH AND CORONARY ANGIOGRAPHY N/A 07/19/2020  ? Procedure: RIGHT/LEFT HEART CATH AND CORONARY ANGIOGRAPHY;  Surgeon: Wellington Hampshire, MD;  Location: [facility];  Service: Cardiovascular;   Laterality: N/A;  ? ? ?Social History ?Social History  ? ?Tobacco Use  ? Smoking status: Never  ? Smokeless tobacco: Never  ?Vaping Use  ? Vaping Use: Never used  ?Substance Use Topics  ? Alcohol use: Not Currently  ? Drug use: Never  ? ? ?Family History ?Family History  ?Problem Relation Age of Onset  ? Kidney failure Mother   ?     died @ 38  ? Heart failure Mother   ? Other Father   ?     he never knew his father  ? Diabetes Brother   ? ? ?No Known Allergies ? ? ?REVIEW  OF SYSTEMS (Negative unless checked) ? ?Constitutional: [] Weight loss  [] Fever  [] Chills ?Cardiac: [x] Chest pain   [x] Chest pressure   [] Palpitations   [] Shortness of breath when laying flat   [x] Shortness of breath at rest   [] Shortness of breath with exertion. ?Vascular:  [] Pain in legs with walking   [] Pain in legs at rest   [] Pain in legs when laying flat   [] Claudication   [] Pain in feet when walking  [] Pain in feet at rest  [] Pain in feet when laying flat   [] History of DVT   [] Phlebitis   [] Swelling in legs   [] Varicose veins   [] Non-healing ulcers ?Pulmonary:   [] Uses home oxygen   [] Productive cough   [] Hemoptysis   [] Wheeze  [] COPD   [] Asthma ?Neurologic:  [] Dizziness  [] Blackouts   [] Seizures   [] History of stroke   [] History of TIA  [] Aphasia   [] Temporary blindness   [] Dysphagia   [] Weakness or numbness in arms   [] Weakness or numbness in legs ?Musculoskeletal:  [] Arthritis   [] Joint swelling   [] Joint pain   [] Low back pain ?Hematologic:  [] Easy bruising  [] Easy bleeding   [] Hypercoagulable state   [] Anemic  [] Hepatitis ?Gastrointestinal:  [] Blood in stool   [] Vomiting blood  [] Gastroesophageal reflux/heartburn   [] Difficulty swallowing. ?Genitourinary:  [x] Chronic kidney disease   [] Difficult urination  [] Frequent urination  [] Burning with urination   [] Blood in urine ?Skin:  [] Rashes   [] Ulcers   [] Wounds ?Psychological:  [] History of anxiety   []  History of major depression. ? ?Physical Examination ? ?Vitals:  ? 10/01/21 1920  10/01/21 1920 10/01/21 1953 10/01/21 2000  ?BP:  (!) 207/106 (!) 216/101 (!) 219/102  ?Pulse:  82 85 82  ?Resp:  18 (!) 26 16  ?Temp:  98.7 ?F (37.1 ?C)    ?TempSrc:  Oral    ?SpO2:  94% 98% 100%  ?Height: 5\' 6"  (1.676 m

## 2021-10-01 NOTE — ED Notes (Signed)
Pt here for swelling to L arm; L hand numbness, onset after having new AV fistula placed on 09/29/2021. Pt in severe pain at this point. Obvious swelling noted to L hand. Pt states he cannot feel touch to the L hand. ?

## 2021-10-01 NOTE — ED Notes (Signed)
[place], light green, red, blue top tubes; gray top tube on ice; blood cx's x1 set sent to lab. ?

## 2021-10-01 NOTE — ED Provider Notes (Signed)
? ?[facility] ?Provider Note ? ? ? Event Date/Time  ? First MD Initiated Contact with Patient 10/01/21 1924   ?  (approximate) ? ? ?History  ? ?No chief complaint on file. ? ? ?HPI ? ?Jimmy Olson is a 61 y.o. male with a past medical history of DM, HTN, CAD, CHF, DVT on Eliquis ESRD and recent left upper extremity AV fistula placement on 4/13 who presents the emergency room stating he has had worsening pain and swelling and numbness in his hand with pain spreading up his arm all of his shoulder.  States he has sensation in his forearm but cannot feel his fingers.  He has not had any fevers, chest pain, cough or shortness of breath but did vomit once today.  States he has been taking hydrocodone as prescribed for his pain but he feels it is not doing anything.  No new back pain, neck pain, trauma, or any other acute concerns.  He has not missed any recent dialysis sessions. ? ?  ? ? ?Physical Exam  ?Triage Vital Signs: ?ED Triage Vitals  ?Enc Vitals Group  ?   BP 10/01/21 1920 (!) 207/106  ?   Pulse Rate 10/01/21 1920 82  ?   Resp 10/01/21 1920 18  ?   Temp 10/01/21 1920 98.7 ?F (37.1 ?C)  ?   Temp Source 10/01/21 1920 Oral  ?   SpO2 10/01/21 1920 94 %  ?   Weight --   ?   Height 10/01/21 1920 5\' 6"  (1.676 m)  ?   Head Circumference --   ?   Peak Flow --   ?   Pain Score 10/01/21 1919 10  ?   Pain Loc --   ?   Pain Edu? --   ?   Excl. in [place]? --   ? ? ?Most recent vital signs: ?Vitals:  ? 10/01/21 2230 10/01/21 2240  ?BP: (!) 191/84 (!) 168/82  ?Pulse:    ?Resp:    ?Temp:    ?SpO2:    ? ? ?General: Awake, seems extremely uncomfortable. ?CV:  Audible bruit at site of recent AV fistula placement proximal palpable pulse. ?Resp:  Normal effort.  ?Abd:  No distention.  ?Other:  Patient's left hand diffusely edematous.  He is able to move his digits.  He is essentially no sensation to light touch in his digits or palm.  Sensation is intact to light touch in the wrist.  There is some warmth and  erythema around the forearm and wrist at the incision site itself appears clean dry and intact without any bleeding purulence. ? ? ?ED Results / Procedures / Treatments  ?Labs ?(all labs ordered are listed, but only abnormal results are displayed) ?Labs Reviewed  ?CBC WITH DIFFERENTIAL/PLATELET - Abnormal; Notable for the following components:  ?    Result Value  ? RBC 3.58 (*)   ? Hemoglobin 10.2 (*)   ? HCT 31.6 (*)   ? All other components within normal limits  ?BASIC METABOLIC PANEL - Abnormal; Notable for the following components:  ? Sodium 133 (*)   ? Chloride 96 (*)   ? Glucose, Bld 515 (*)   ? BUN 45 (*)   ? Creatinine, Ser 4.06 (*)   ? GFR, Estimated 16 (*)   ? All other components within normal limits  ?BLOOD GAS, VENOUS - Abnormal; Notable for the following components:  ? pH, Ven 7.45 (*)   ? pCO2, Ven 36 (*)   ?  pO2, Ven 103 (*)   ? All other components within normal limits  ?TROPONIN I (HIGH SENSITIVITY) - Abnormal; Notable for the following components:  ? Troponin I (High Sensitivity) 210 (*)   ? All other components within normal limits  ?MRSA NEXT GEN BY PCR, NASAL  ?CULTURE, BLOOD (ROUTINE X 2)  ?CULTURE, BLOOD (ROUTINE X 2)  ?LACTIC ACID, PLASMA  ?BETA-HYDROXYBUTYRIC ACID  ?LACTIC ACID, PLASMA  ?LIPID PANEL  ?TROPONIN I (HIGH SENSITIVITY)  ?TROPONIN I (HIGH SENSITIVITY)  ? ? ? ?EKG ? ?ECGs remarkable sinus rhythm with a ventricular rate of 87, normal axis, unremarkable intervals with some T wave inversions and depression in V5 and V6 as well as subtle change in V4 and lead I largely similar to ECG from 4/11. ? ? ?RADIOLOGY ? ?Chest x-ray shows right tunneled cath in place and cardiomegaly with some appears to be mild pulmonary edema without clear focal consolidation, large effusion or pneumothorax. ? ? ?PROCEDURES: ? ?Critical Care performed: Yes, see critical care procedure note(s) ? ?Procedures ? ?The patient is on the cardiac monitor to evaluate for evidence of arrhythmia and/or significant  heart rate changes. ? ? ?MEDICATIONS ORDERED IN ED: ?Medications  ?carvedilol (COREG) tablet 25 mg (25 mg Oral Given 10/01/21 2009)  ?insulin aspart (novoLOG) injection 0-15 Units (has no administration in time range)  ?nitroGLYCERIN (NITROSTAT) SL tablet 0.4 mg (0.4 mg Sublingual Given 10/01/21 2130)  ?morphine (PF) 2 MG/ML injection 2 mg (2 mg Intravenous Given 10/01/21 2137)  ?vancomycin (VANCOREADY) IVPB 1500 mg/300 mL (has no administration in time range)  ?vancomycin variable dose per unstable renal function (pharmacist dosing) (has no administration in time range)  ?labetalol (NORMODYNE) injection 10 mg (has no administration in time range)  ?ezetimibe (ZETIA) tablet 10 mg (has no administration in time range)  ?hydrALAZINE (APRESOLINE) tablet 100 mg (has no administration in time range)  ?isosorbide mononitrate (IMDUR) 24 hr tablet 30 mg (has no administration in time range)  ?nitroGLYCERIN (NITROSTAT) SL tablet 0.4 mg (has no administration in time range)  ?rosuvastatin (CRESTOR) tablet 40 mg (has no administration in time range)  ?sertraline (ZOLOFT) tablet 50 mg (has no administration in time range)  ?albuterol (PROVENTIL) (2.5 MG/3ML) 0.083% nebulizer solution 3 mL (has no administration in time range)  ?acetaminophen (TYLENOL) tablet 650 mg (has no administration in time range)  ?ondansetron (ZOFRAN) injection 4 mg (has no administration in time range)  ?heparin injection 5,000 Units (has no administration in time range)  ?0.9 %  sodium chloride infusion (has no administration in time range)  ?nicardipine (CARDENE) 20mg  in 0.86% saline 256ml IV infusion (0.1 mg/ml) (3 mg/hr Intravenous Infusion Verify 10/01/21 2247)  ?ceFEPIme (MAXIPIME) 1 g in sodium chloride 0.9 % 100 mL IVPB (has no administration in time range)  ?iohexol (OMNIPAQUE) 350 MG/ML injection 75 mL (has no administration in time range)  ?HYDROmorphone (DILAUDID) injection 1 mg (1 mg Intravenous Given 10/01/21 1943)  ?aspirin chewable tablet 324  mg (324 mg Oral Given 10/01/21 2139)  ?labetalol (NORMODYNE) injection 10 mg (10 mg Intravenous Given 10/01/21 2133)  ?nitroGLYCERIN (NITROGLYN) 2 % ointment 1 inch (1 inch Topical Given 10/01/21 2203)  ? ? ? ?IMPRESSION / MDM / ASSESSMENT AND PLAN / ED COURSE  ?I reviewed the triage vital signs and the nursing notes. ?             ?               ? ?Differential diagnosis includes, but is not  limited to cellulitis, compartment syndrome, nerve injury, vessel injury versus other deep space infection. ? ?CBC shows no leukocytosis and hemoglobin of 10.2 compared to 10.92 days ago.  Normal platelets.  BMP is remarkable for glucose of 515 and kidney function consistent with known history of ESRD without other significant acute derangements.  Beta-hydroxybutyrate is 0.09.  VBG with a pH of 7.45 with a PCO2 of 36 and bicarb 25 and overall this picture is not consistent with DKA.  Lactic acid is 0.8.  Patient has no fever or leukocytosis and overall have a lower suspicion for acute infectious process at this time. ? ?Of note after patient was seen at bedside by vascular surgeon Dr. Lorenso Courier, consulted and patient was in the process of getting with the hospital service he began complaining of some chest pain.  EKG and troponins added to basic labs.  Dr. Lorenso Courier recommends hospitalist admission with plan to operate on patient's left arm tomorrow with concern for steal syndrome. ? ? ?ECGs remarkable sinus rhythm with a ventricular rate of 87, normal axis, unremarkable intervals with some T wave inversions and depression in V5 and V6 as well as subtle change in V4 and lead I largely similar to ECG from 4/11.  Initial opponent 210 compared to 3577 1 month ago.  Suspect demand ischemia.  We will give ASA and some as needed nitroglycerin and labetalol for his blood pressure.  I consulted with cardiologist Dr. Rockey Situ who agreed with treating blood pressure.  Patient is on Eliquis and will defer heparin at this time. ? ?Chest x-ray shows  right tunneled cath in place and cardiomegaly with some appears to be mild pulmonary edema without clear focal consolidation, large effusion or pneumothorax. ? ?I will admit to medicine service for further evalua

## 2021-10-01 NOTE — H&P (Signed)
?History and Physical  ? ? ?Patient: Jimmy Olson TOI:[id number] DOB: [birth year] ?DOA: 10/01/2021 ?DOS: the patient was seen and examined on 10/02/2021 ?PCP: [facility] [place]  ?Patient coming from: Home ? ?Chief Complaint: No chief complaint on file. ? ? ?HPI 1/11/Windle A Wombles is a 61 y.o. male with medical history significant of diabetes mellitus type 2, end-stage renal disease on CAD, history of DVT presenting with left arm pain swelling and numbness x1 day.   ?The pain he described as constant and progressive. ?Pt was d/c on the 13th after radial a-V fistula  ?Pt coming back on the 15th with complaint of left upper extremity pain and swelling affecting left hand . ?Pt has not redness and warmth and on area of pain.  ?Pain 7/10/ and pian is constant , started in morning about 4 am.  ?Pt does not report fevers or chills.  ?No other associated symptoms. ?Pain is worse is with moving and feels tight and like the nerves are pulling and also numbness. ? ?When I was seeing pt Dr. Lorenso Courier was at bedside d/w pt. ?A min later pt was c/o chest pain 8/10, frowning and sob/ with htn elevated troponin.  ?Reports that he did not have chest pain at home.  ? ?Chart review shows that patient has a 61 year old Hispanic male Spanish-speaking only with a history of nonobstructive CAD by left heart cath in January 2022, chronic combined systolic and congestive heart failure, nonischemic cardiomyopathy, end-stage renal disease on hemodialysis, diabetes mellitus type 2, hypertension, anemia, history of TIA and DVT on Eliquis scheduled for fistula surgery with Dr. Lucky Cowboy on 09/29/2021. ?Patient was seen by cardiology Dr. Rockey Situ on 09/27/2021.  EKG at that time was sinus rhythm at 80 with T wave inversions in V3 to V6 with 1 and aVL.  Patient was discharged 07/15/2021 when he was admitted for left arm numbness and dizziness and inability to stand without assistance with concern for CVA work-up was negative for CVA suspecting  TIA.  Patient was continued on Eliquis. ? ?Patient on my initial evaluation is complaining of chest pain 8 out of 10 precordial region pressure nonradiating with shortness of breath while hypertensive emergency. ?Patient states this chest pain is much worse on taking deep breaths. ?Patient given nitroglycerin x3 sublingual, with persistent chest pain that the 3 nitros the chest pain went down to a 5 out of 10.  Which was about 1520 minutes later we started patient on Nitropaste. ?Blood pressure still elevated patient started on nicardipine drip. ?Repeat blood pressure was systolic 833 at which point nurse was asked via secure chat message to stop the nicardipine drip. ?Patient was then transported upstairs to stepdown unit. ?Patient also had a CT chest PE protocol and left upper extremity CT imaging with contrast, to identify any dissections or PE or any soft tissue infection, as patient was having persistent chest pain with elevated troponins. ? ?Review of Systems: Review of Systems  ?Respiratory:  Positive for shortness of breath.   ?Cardiovascular:  Positive for chest pain.  ?Neurological:  Positive for tingling.  ? ?Past Medical History:  ?Diagnosis Date  ? Anemia   ? Anxiety and depression   ? ATN (acute tubular necrosis) (HCC)   ? CAD (coronary artery disease)   ? a.) R/LHC 07/19/2020: 20% pRCA, 30% mRCA, 50% dRCA, 50% RPAV, 40% pLAD, 70% D1, 60% mLAD; LVEDP 19 mmHg; PA 37/16 (26 mmHg); PCWP 13 mmHg. b.) LHC 06/03/2021: EF 45-50%; LVEDP 18 mmHg; 40% pLAD,  60% mLAD, 20% pRCA, 30% mRCA, 70% D1, 30% dRCA, 40% RPDA.  ? Chronic HFrEF (heart failure with reduced ejection fraction) ([place])   ? a.) 06/2020 Echo: EF 30-35%, glob HK, GrII DD, nl RV fxn. Mild LAE. b.)  TTE 08/08/2021: EF 40-45%; global HK; mild LVH; GLS -8.6; mild LA dilation.  ? DVT of axillary vein, acute left ([place]) 05/2021  ? a.) Tx'd with apixaban  ? ESRD (end stage renal disease) on dialysis [facility])   ? a.) M-W-F  ? History of 2019 novel coronavirus  disease (COVID-19) 03/06/2020  ? a.) s/p Tx with monoclonal Ab infusion  ? HLD (hyperlipidemia)   ? Hyperkalemia 12/23/2020  ? Hypertension   ? Left carotid artery stenosis 08/18/2021  ? a.) Carotid Doppler 93/26/7124: 58-09% LICA stenosis  ? Long term current use of anticoagulant   ? a.) apixaban for DVT  ? NICM (nonischemic cardiomyopathy) ([place])   ? a.) 07/2009 MV: EF 48%; b.) 08/2019 Echo: EF 45-50%. Global HK. Mod LVH. GrIDD; c.) 06/2020 Echo: EF 30-35%, glob HK, mild LVH, GrII DD, nl RV fxn, mild LAE; d.) 06/2020 Cath: nonobs dzs. e.)  LHC 06/03/2021; EF 45-50%; LVEDP 18 mmHg; nonobstructive CAD. f.) b.)  TTE 08/08/2021: EF 40-45%; global HK; mild LVH; GLS -8.6; mild LA dilation.  ? NSTEMI (non-ST elevated myocardial infarction) ([place]) 07/15/2020  ? a.) hsTnI trended: 124--> 127--> 176 --> 190 --> 200 ng/L.  ? Sepsis ([place])   ? Suicidal ideations   ? TIA (transient ischemic attack) 06/2021  ? Type 2 diabetes mellitus treated with insulin ([place])   ? ?Past Surgical History:  ?Procedure Laterality Date  ? APPENDECTOMY    ? AV FISTULA PLACEMENT Left 09/29/2021  ? Procedure: ARTERIOVENOUS (AV) FISTULA CREATION ( RADIAL CEPHALIC);  Surgeon: Algernon Huxley, MD;  Location: [facility] ORS;  Service: Vascular;  Laterality: Left;  ? CARDIAC CATHETERIZATION    ? DIALYSIS/PERMA CATHETER INSERTION N/A 06/02/2021  ? Procedure: DIALYSIS/PERMA CATHETER INSERTION;  Surgeon: Algernon Huxley, MD;  Location: [place] [facility];  Service: Cardiovascular;  Laterality: N/A;  ? LEFT HEART CATH AND CORONARY ANGIOGRAPHY N/A 06/03/2021  ? Procedure: LEFT HEART CATH AND CORONARY ANGIOGRAPHY;  Surgeon: Wellington Hampshire, MD;  Location: [place] [facility];  Service: Cardiovascular;  Laterality: N/A;  ? NASAL SINUS SURGERY    ? RIGHT/LEFT HEART CATH AND CORONARY ANGIOGRAPHY N/A 07/19/2020  ? Procedure: RIGHT/LEFT HEART CATH AND CORONARY ANGIOGRAPHY;  Surgeon: Wellington Hampshire, MD;  Location: [place] [facility];  Service: Cardiovascular;  Laterality: N/A;   ? ?Social History:  reports that he has never smoked. He has never used smokeless tobacco. He reports that he does not currently use alcohol. He reports that he does not use drugs. ? ?No Known Allergies ? ?Family History  ?Problem Relation Age of Onset  ? Kidney failure Mother   ?     died @ 86  ? Heart failure Mother   ? Other Father   ?     he never knew his father  ? Diabetes Brother   ? ? ?Prior to Admission medications   ?Medication Sig Start Date End Date Taking? Authorizing Provider  ?albuterol (PROVENTIL HFA) 108 (90 Base) MCG/ACT inhaler INHALE 2 PUFFS INTO THE LUNGS ONCE EVERY 6 HOURS AS NEEDED FOR WHEEZING OR SHORTNESS OF BREATH. 07/13/21     ?apixaban (ELIQUIS) 5 MG TABS tablet TAKE 1 TABLET BY MOUTH TWICE DAILY. ?Patient taking differently: Take 5 mg by mouth daily. 07/11/21     ?  carvedilol (COREG) 25 MG tablet Take 1 tablet (25 mg total) by mouth 2 (two) times daily. 07/25/21   Minna Merritts, MD  ?ezetimibe (ZETIA) 10 MG tablet Take by mouth. 08/26/21   [provider]  ?Cleda Clarks 100 UNIT/ML KwikPen Inject into the skin 3 (three) times daily. Sliding scale 07/11/21   [provider]  ?hydrALAZINE (APRESOLINE) 100 MG tablet Take 1 tablet (100 mg total) by mouth once every 8 (eight) hours. ?Patient taking differently: Take 100 mg by mouth 2 (two) times daily. 07/25/21   Minna Merritts, MD  ?HYDROcodone-acetaminophen (NORCO/VICODIN) 5-325 MG tablet Take 2 tablets by mouth every 6 (six) hours as needed for moderate pain. 09/29/21 09/29/22  Algernon Huxley, MD  ?insulin detemir (LEVEMIR FLEXTOUCH) 100 UNIT/ML FlexPen Inject 20 Units into the skin once daily at bedtime. ?Patient taking differently: Inject 25 Units into the skin at bedtime. 07/12/21     ?Insulin Pen Needle 32G X 4 MM MISC USE AS DIRECTED 06/07/21   Ralene Muskrat B, MD  ?isosorbide mononitrate (IMDUR) 30 MG 24 hr tablet Take 1 tablet (30 mg total) by mouth in the morning and at bedtime. 07/25/21   Minna Merritts, MD   ?nitroGLYCERIN (NITROSTAT) 0.4 MG SL tablet Place 1 tablet (0.4 mg total) under the tongue every 5 (five) minutes as needed for chest pain. 07/25/20   Jennye Boroughs, MD  ?rosuvastatin (CRESTOR) 40 MG tablet T

## 2021-10-01 NOTE — ED Notes (Signed)
Pt transported to CT via stretcher with CT tech. °

## 2021-10-01 NOTE — Consult Note (Signed)
Pharmacy Antibiotic Note ? ?Jimmy Olson is a 61 y.o. male admitted on 10/01/2021 with bacteremia.  Pharmacy has been consulted for Cefepime and Vancomycin dosing. ? ?Noted procedure for Left radiocephaic arteriovenous fistula placement on 09/29/2021. ? ?Plan: ?Vancomycin 1500mg  IV x1 dose today. ?Will follow up HD section and cultures to determine additional regimen. Expected need 750mg  on dialysis days.  ?Target level 15-81mcg/mL ? ?2. Cefepime 1gm every 24 hours ? ? ?Height: 5\' 6"  (167.6 cm) ?IBW/kg (Calculated) : 63.8 ? ?Temp (24hrs), Avg:98.7 ?F (37.1 ?C), Min:98.7 ?F (37.1 ?C), Max:98.7 ?F (37.1 ?C) ? ?Recent Labs  ?Lab 09/29/21 ?1216 10/01/21 ?1937 10/01/21 ?1940  ?WBC  --  5.4  --   ?CREATININE 3.90* 4.06*  --   ?LATICACIDVEN  --   --  0.8  ?  ?Estimated Creatinine Clearance: 17.9 mL/min (A) (by C-G formula based on SCr of 4.06 mg/dL (H)).   ? ?No Known Allergies ? ?Antimicrobials this admission: ?4/15 Vancomycin >>  ?4/15 Cefepime >>  ? ?Dose adjustments this admission: Vanco and cefepime for ESRD ? ?Microbiology results: ?4/15 BCx: pending collection ?4/15 MRSA PCR: pending collection ? ?Thank you for allowing pharmacy to be a part of this patient?s care. ? ?Latisa Belay Rodriguez-Pies PharmD, BCPS ?10/01/2021 10:27 PM ? ?

## 2021-10-01 NOTE — ED Notes (Signed)
Report given to Christie Beckers, RN via phone. ?

## 2021-10-01 NOTE — ED Notes (Signed)
VBG sent to lab on ice; RT notified by this RN. ?

## 2021-10-02 ENCOUNTER — Encounter
Admission: EM | Disposition: A | Discharge status: Home / Self Care | Payer: Self-pay | Source: Home / Self Care | Attending: Internal Medicine

## 2021-10-02 ENCOUNTER — Inpatient Hospital Stay: Payer: Medicare Other

## 2021-10-02 ENCOUNTER — Encounter: Payer: Self-pay | Admitting: Internal Medicine

## 2021-10-02 ENCOUNTER — Inpatient Hospital Stay: Payer: Medicare Other | Admitting: Certified Registered"

## 2021-10-02 ENCOUNTER — Other Ambulatory Visit: Payer: Self-pay

## 2021-10-02 DIAGNOSIS — I5020 Unspecified systolic (congestive) heart failure: Secondary | ICD-10-CM

## 2021-10-02 DIAGNOSIS — Z992 Dependence on renal dialysis: Secondary | ICD-10-CM

## 2021-10-02 DIAGNOSIS — I161 Hypertensive emergency: Secondary | ICD-10-CM | POA: Diagnosis not present

## 2021-10-02 DIAGNOSIS — I25118 Atherosclerotic heart disease of native coronary artery with other forms of angina pectoris: Secondary | ICD-10-CM | POA: Diagnosis not present

## 2021-10-02 DIAGNOSIS — I248 Other forms of acute ischemic heart disease: Secondary | ICD-10-CM | POA: Diagnosis not present

## 2021-10-02 DIAGNOSIS — N186 End stage renal disease: Secondary | ICD-10-CM | POA: Diagnosis not present

## 2021-10-02 DIAGNOSIS — Z86718 Personal history of other venous thrombosis and embolism: Secondary | ICD-10-CM

## 2021-10-02 DIAGNOSIS — M79602 Pain in left arm: Secondary | ICD-10-CM

## 2021-10-02 DIAGNOSIS — I2510 Atherosclerotic heart disease of native coronary artery without angina pectoris: Secondary | ICD-10-CM

## 2021-10-02 DIAGNOSIS — I10 Essential (primary) hypertension: Secondary | ICD-10-CM

## 2021-10-02 DIAGNOSIS — R079 Chest pain, unspecified: Secondary | ICD-10-CM | POA: Diagnosis present

## 2021-10-02 HISTORY — PX: LIGATION OF ARTERIOVENOUS  FISTULA: SHX5948

## 2021-10-02 LAB — GLUCOSE, CAPILLARY
Glucose-Capillary: 88 mg/dL (ref 70–99)
Glucose-Capillary: 136 mg/dL — ABNORMAL HIGH (ref 70–99)
Glucose-Capillary: 159 mg/dL — ABNORMAL HIGH (ref 70–99)
Glucose-Capillary: 176 mg/dL — ABNORMAL HIGH (ref 70–99)
Glucose-Capillary: 82 mg/dL (ref 70–99)
Glucose-Capillary: 268 mg/dL — ABNORMAL HIGH (ref 70–99)
Glucose-Capillary: 159 mg/dL — ABNORMAL HIGH (ref 70–99)

## 2021-10-02 LAB — LIPID PANEL
Cholesterol: 113 mg/dL (ref 0–200)
HDL: 34 mg/dL — ABNORMAL LOW (ref >40)
LDL Cholesterol: 45 mg/dL (ref 0–99)
Total CHOL/HDL Ratio: 3.3 RATIO
Triglycerides: 170 mg/dL — ABNORMAL HIGH (ref <150)
VLDL: 34 mg/dL (ref 0–40)

## 2021-10-02 LAB — MRSA NEXT GEN BY PCR, NASAL: MRSA by PCR Next Gen: NOT DETECTED

## 2021-10-02 LAB — TROPONIN I (HIGH SENSITIVITY)
Troponin I (High Sensitivity): 199 ng/L (ref <18)
Troponin I (High Sensitivity): 224 ng/L (ref <18)

## 2021-10-02 LAB — LACTIC ACID, PLASMA: Lactic Acid, Venous: 0.7 mmol/L (ref 0.5–1.9)

## 2021-10-02 SURGERY — LIGATION OF ARTERIOVENOUS  FISTULA
Anesthesia: Monitor Anesthesia Care | Site: Arm Lower | Laterality: Left

## 2021-10-02 MED ORDER — LABETALOL HCL 5 MG/ML IV SOLN: 10.0000 mg | INTRAVENOUS | Status: DC | PRN

## 2021-10-02 MED ORDER — HYDROCODONE-ACETAMINOPHEN 5-325 MG PO TABS
1.0000 | ORAL_TABLET | Freq: Four times a day (QID) | ORAL | Status: DC | PRN
  Administered 2021-10-02 – 2021-10-06 (×6): 1 via ORAL
  Filled 2021-10-02 (×7): qty 1

## 2021-10-02 MED ORDER — MIDAZOLAM HCL 2 MG/2ML IJ SOLN
INTRAMUSCULAR | Status: DC | PRN
  Administered 2021-10-02: 2 mg via INTRAVENOUS

## 2021-10-02 MED ORDER — MIDAZOLAM HCL 2 MG/2ML IJ SOLN
INTRAMUSCULAR | Status: AC
  Filled 2021-10-02: qty 2

## 2021-10-02 MED ORDER — FENTANYL CITRATE (PF) 100 MCG/2ML IJ SOLN
INTRAMUSCULAR | Status: AC
  Filled 2021-10-02: qty 2

## 2021-10-02 MED ORDER — NITROGLYCERIN 2 % TD OINT
1.0000 [in_us] | TOPICAL_OINTMENT | Freq: Four times a day (QID) | TRANSDERMAL | Status: DC | PRN
  Administered 2021-10-02: 1 [in_us] via TOPICAL
  Filled 2021-10-02: qty 1

## 2021-10-02 MED ORDER — KETAMINE HCL 50 MG/5ML IJ SOSY
PREFILLED_SYRINGE | INTRAMUSCULAR | Status: AC
  Filled 2021-10-02: qty 5

## 2021-10-02 MED ORDER — ROPIVACAINE HCL 5 MG/ML IJ SOLN
INTRAMUSCULAR | Status: AC
  Filled 2021-10-02: qty 30

## 2021-10-02 MED ORDER — PROPOFOL 10 MG/ML IV BOLUS
INTRAVENOUS | Status: AC
  Filled 2021-10-02: qty 20

## 2021-10-02 MED ORDER — EPOETIN ALFA 10000 UNIT/ML IJ SOLN
4000.0000 [IU] | INTRAMUSCULAR | Status: DC
  Administered 2021-10-03 – 2021-10-07 (×3): 4000 [IU] via INTRAVENOUS
  Filled 2021-10-02: qty 0.4

## 2021-10-02 MED ORDER — INSULIN DETEMIR 100 UNIT/ML ~~LOC~~ SOLN
6.0000 [IU] | Freq: Every day | SUBCUTANEOUS | Status: DC
  Administered 2021-10-02 – 2021-10-04 (×3): 6 [IU] via SUBCUTANEOUS
  Filled 2021-10-02 (×3): qty 0.06

## 2021-10-02 MED ORDER — LIDOCAINE HCL (PF) 1 % IJ SOLN
INTRAMUSCULAR | Status: DC | PRN
  Administered 2021-10-02: 5 mL via SUBCUTANEOUS

## 2021-10-02 MED ORDER — LIDOCAINE HCL (PF) 2 % IJ SOLN
INTRAMUSCULAR | Status: AC
  Filled 2021-10-02: qty 5

## 2021-10-02 MED ORDER — FENTANYL CITRATE (PF) 100 MCG/2ML IJ SOLN
INTRAMUSCULAR | Status: DC | PRN
  Administered 2021-10-02: 50 ug via INTRAVENOUS

## 2021-10-02 MED ORDER — HEPARIN 5000 UNITS IN NS 1000 ML (FLUSH)
INTRAMUSCULAR | Status: DC | PRN
  Administered 2021-10-02: 500 mL via INTRAMUSCULAR

## 2021-10-02 MED ORDER — LIDOCAINE HCL (PF) 1 % IJ SOLN
INTRAMUSCULAR | Status: AC
  Filled 2021-10-02: qty 5

## 2021-10-02 MED ORDER — CHLORHEXIDINE GLUCONATE CLOTH 2 % EX PADS
6.0000 | MEDICATED_PAD | Freq: Every day | CUTANEOUS | Status: DC
  Administered 2021-10-02 – 2021-10-08 (×6): 6 via TOPICAL

## 2021-10-02 MED ORDER — ROPIVACAINE HCL 5 MG/ML IJ SOLN
INTRAMUSCULAR | Status: DC | PRN
  Administered 2021-10-02: 30 mL via PERINEURAL

## 2021-10-02 MED ORDER — HYDRALAZINE HCL 50 MG PO TABS
100.0000 mg | ORAL_TABLET | Freq: Three times a day (TID) | ORAL | Status: DC
  Administered 2021-10-02 – 2021-10-08 (×18): 100 mg via ORAL
  Filled 2021-10-02 (×19): qty 2

## 2021-10-02 MED ORDER — ROSUVASTATIN CALCIUM 10 MG PO TABS
40.0000 mg | ORAL_TABLET | Freq: Every day | ORAL | Status: DC
Start: 2021-10-02 — End: 2021-10-04
  Administered 2021-10-02 – 2021-10-04 (×3): 40 mg via ORAL
  Filled 2021-10-02 (×3): qty 4

## 2021-10-02 SURGICAL SUPPLY — 53 items
BAG DECANTER FOR FLEXI CONT (MISCELLANEOUS) ×2 IMPLANT
BLADE SURG SZ11 CARB STEEL (BLADE) ×2 IMPLANT
BOOT SUTURE AID YELLOW STND (SUTURE) IMPLANT
CHLORAPREP W/TINT 26 (MISCELLANEOUS) ×2 IMPLANT
CLIP VESOCCLUDE MED 6/CT (CLIP) IMPLANT
CLIP VESOCCLUDE SM WIDE 6/CT (CLIP) IMPLANT
DERMABOND ADVANCED (GAUZE/BANDAGES/DRESSINGS) ×1
DERMABOND ADVANCED .7 DNX12 (GAUZE/BANDAGES/DRESSINGS) ×1 IMPLANT
DRESSING SURGICEL FIBRLLR 1X2 (HEMOSTASIS) ×1 IMPLANT
DRSG SURGICEL FIBRILLAR 1X2 (HEMOSTASIS) ×2
DRSG TEGADERM 2X2.25 PEDS (GAUZE/BANDAGES/DRESSINGS) ×1 IMPLANT
ELECT CAUTERY BLADE 6.4 (BLADE) ×2 IMPLANT
ELECT REM PT RETURN 9FT ADLT (ELECTROSURGICAL) ×2
ELECTRODE REM PT RTRN 9FT ADLT (ELECTROSURGICAL) ×1 IMPLANT
GLOVE SURG SYN 8.0 (GLOVE) ×2 IMPLANT
GLOVE SURG SYN 8.0 PF PI (GLOVE) ×1 IMPLANT
GOWN STRL REUS W/ TWL LRG LVL3 (GOWN DISPOSABLE) ×1 IMPLANT
GOWN STRL REUS W/ TWL XL LVL3 (GOWN DISPOSABLE) ×1 IMPLANT
GOWN STRL REUS W/TWL LRG LVL3 (GOWN DISPOSABLE) ×2
GOWN STRL REUS W/TWL XL LVL3 (GOWN DISPOSABLE) ×2
IV NS 500ML (IV SOLUTION) ×2
IV NS 500ML BAXH (IV SOLUTION) ×1 IMPLANT
KIT TURNOVER KIT A (KITS) ×2 IMPLANT
LABEL OR SOLS (LABEL) ×2 IMPLANT
LOOP RED MAXI  1X406MM (MISCELLANEOUS)
LOOP VESSEL MAXI  1X406 RED (MISCELLANEOUS)
LOOP VESSEL MAXI 1X406 RED (MISCELLANEOUS) IMPLANT
LOOP VESSEL MINI 0.8X406 BLUE (MISCELLANEOUS) IMPLANT
LOOPS BLUE MINI 0.8X406MM (MISCELLANEOUS)
MANIFOLD NEPTUNE II (INSTRUMENTS) ×2 IMPLANT
NDL FILTER BLUNT 18X1 1/2 (NEEDLE) ×1 IMPLANT
NEEDLE FILTER BLUNT 18X 1/2SAF (NEEDLE) ×1
NEEDLE FILTER BLUNT 18X1 1/2 (NEEDLE) ×1 IMPLANT
PACK EXTREMITY ARMC (MISCELLANEOUS) ×2 IMPLANT
PAD PREP 24X41 OB/GYN DISP (PERSONAL CARE ITEMS) ×2 IMPLANT
SPONGE GAUZE 2X2 8PLY STRL LF (GAUZE/BANDAGES/DRESSINGS) ×1 IMPLANT
STOCKINETTE 48X4 2 PLY STRL (GAUZE/BANDAGES/DRESSINGS) ×1 IMPLANT
STOCKINETTE STRL 4IN 9604848 (GAUZE/BANDAGES/DRESSINGS) ×2 IMPLANT
SUT ETHIBOND 0 36 GRN (SUTURE) IMPLANT
SUT MNCRL+ 5-0 UNDYED PC-3 (SUTURE) ×1 IMPLANT
SUT MONOCRYL 5-0 (SUTURE) ×2
SUT PROLENE 6 0 BV (SUTURE) IMPLANT
SUT SILK 2 0 (SUTURE)
SUT SILK 2-0 18XBRD TIE 12 (SUTURE) IMPLANT
SUT SILK 3 0 (SUTURE)
SUT SILK 3-0 18XBRD TIE 12 (SUTURE) IMPLANT
SUT SILK 4 0 (SUTURE)
SUT SILK 4-0 18XBRD TIE 12 (SUTURE) IMPLANT
SUT VIC AB 3-0 SH 27 (SUTURE) ×2
SUT VIC AB 3-0 SH 27X BRD (SUTURE) ×1 IMPLANT
SYR 20ML LL LF (SYRINGE) ×2 IMPLANT
SYR 3ML LL SCALE MARK (SYRINGE) ×2 IMPLANT
WATER STERILE IRR 500ML POUR (IV SOLUTION) ×2 IMPLANT

## 2021-10-02 NOTE — Assessment & Plan Note (Addendum)
Strict I's and O's. ?Patient is euvolemic.  Next HD session is on Monday. ?Continue patient on Coreg, Imdur, labetalol. ?Dr. Rockey Situ cardiology is following ?

## 2021-10-02 NOTE — Assessment & Plan Note (Addendum)
Much improved status post ligation for left steal syndrome ? ? ?

## 2021-10-02 NOTE — Consult Note (Signed)
?Cardiology Consultation:  ? ?Patient ID: Jimmy Olson ?MRN: [id number]; DOB: Nov 11, 1960 ? ?Admit date: 10/01/2021 ?Date of Consult: 10/02/2021 ? ?PCP:  [facility] [place] ?  ?[facility] Providers ?Cardiologist:  Ida Rogue, MD   { ?Physician requesting consult: Dr. Florina Ou ?Reason for consult: Chest pain, elevated troponin, preop evaluation ? ? ?Patient Profile:  ? ?Jimmy Olson is a 61 y.o. male with a hx of insulin-dependent diabetes, severe coronary artery disease, end-stage renal disease on hemodialysis, ischemic and nonischemic cardiomyopathy, ejection fraction 40%, carotid stenosis, hypertension, who presents to the hospital with arm pain swelling numbness down to the hand spreading up to his shoulder ? ?History of Present Illness:  ? ?Jimmy Olson is a 61 year old male with past medical history of insulin-dependent type 2 diabetes, chronic systolic heart failure with an EF of 45%, end-stage renal disease initiated dialysis in December 2022, anemia of chronic kidney disease, two-vessel coronary artery disease on left heart catheterization in December 2022, DVT of the left axillary vein in December 2022 on Eliquis and recent admission for TIA with MRI showing ulcerated plaque of the proximal left ICA. ? ?Recent admission for non-STEMI February 2023 no intervention performed given prior catheterization June 03, 2021 ?Plan was to get him through his COVID infection then consider intervention in [place] given need for atherectomy ? ?Recent visit in clinic several weeks ago, denied chest pain symptoms and was scheduled for AV fistula creation surgery, had already stopped his Eliquis in preparation ?He underwent surgery April 13 without complications ?Presented to the hospital yesterday April 15 with hypertension pressures of 016 systolic, pain and numbness swelling in the left hand forearm up to the shoulder, seen by vascular surgery and given progressive nature of his symptoms  and concern for vascular steal, it was recommended he have left AV fistula ligation for steal ? ?For blood pressure control was started on Nitropaste ?Pressures overnight 010X to 323F systolic ? ?Reports from admitting physician that he reported having some chest pain ?Troponin of 200, nontrending ? ? ?Past Medical History:  ?Diagnosis Date  ? Anemia   ? Anxiety and depression   ? ATN (acute tubular necrosis) (HCC)   ? CAD (coronary artery disease)   ? a.) R/LHC 07/19/2020: 20% pRCA, 30% mRCA, 50% dRCA, 50% RPAV, 40% pLAD, 70% D1, 60% mLAD; LVEDP 19 mmHg; PA 37/16 (26 mmHg); PCWP 13 mmHg. b.) LHC 06/03/2021: EF 45-50%; LVEDP 18 mmHg; 40% pLAD, 60% mLAD, 20% pRCA, 30% mRCA, 70% D1, 30% dRCA, 40% RPDA.  ? Chronic HFrEF (heart failure with reduced ejection fraction) ([place])   ? a.) 06/2020 Echo: EF 30-35%, glob HK, GrII DD, nl RV fxn. Mild LAE. b.)  TTE 08/08/2021: EF 40-45%; global HK; mild LVH; GLS -8.6; mild LA dilation.  ? DVT of axillary vein, acute left ([place]) 05/2021  ? a.) Tx'd with apixaban  ? ESRD (end stage renal disease) on dialysis [facility])   ? a.) M-W-F  ? History of 2019 novel coronavirus disease (COVID-19) 03/06/2020  ? a.) s/p Tx with monoclonal Ab infusion  ? HLD (hyperlipidemia)   ? Hyperkalemia 12/23/2020  ? Hypertension   ? Left carotid artery stenosis 08/18/2021  ? a.) Carotid Doppler 57/32/2025: 42-70% LICA stenosis  ? Long term current use of anticoagulant   ? a.) apixaban for DVT  ? NICM (nonischemic cardiomyopathy) ([place])   ? a.) 07/2009 MV: EF 48%; b.) 08/2019 Echo: EF 45-50%. Global HK. Mod LVH. GrIDD; c.) 06/2020 Echo: EF 30-35%, glob  HK, mild LVH, GrII DD, nl RV fxn, mild LAE; d.) 06/2020 Cath: nonobs dzs. e.)  LHC 06/03/2021; EF 45-50%; LVEDP 18 mmHg; nonobstructive CAD. f.) b.)  TTE 08/08/2021: EF 40-45%; global HK; mild LVH; GLS -8.6; mild LA dilation.  ? NSTEMI (non-ST elevated myocardial infarction) ([place]) 07/15/2020  ? a.) hsTnI trended: 124--> 127--> 176 --> 190 --> 200 ng/L.  ? Sepsis ([place])   ?  Suicidal ideations   ? TIA (transient ischemic attack) 06/2021  ? Type 2 diabetes mellitus treated with insulin ([place])   ? ? ?Past Surgical History:  ?Procedure Laterality Date  ? APPENDECTOMY    ? AV FISTULA PLACEMENT Left 09/29/2021  ? Procedure: ARTERIOVENOUS (AV) FISTULA CREATION ( RADIAL CEPHALIC);  Surgeon: Algernon Huxley, MD;  Location: [facility] ORS;  Service: Vascular;  Laterality: Left;  ? CARDIAC CATHETERIZATION    ? DIALYSIS/PERMA CATHETER INSERTION N/A 06/02/2021  ? Procedure: DIALYSIS/PERMA CATHETER INSERTION;  Surgeon: Algernon Huxley, MD;  Location: [place] [facility];  Service: Cardiovascular;  Laterality: N/A;  ? LEFT HEART CATH AND CORONARY ANGIOGRAPHY N/A 06/03/2021  ? Procedure: LEFT HEART CATH AND CORONARY ANGIOGRAPHY;  Surgeon: Wellington Hampshire, MD;  Location: [place] [facility];  Service: Cardiovascular;  Laterality: N/A;  ? NASAL SINUS SURGERY    ? RIGHT/LEFT HEART CATH AND CORONARY ANGIOGRAPHY N/A 07/19/2020  ? Procedure: RIGHT/LEFT HEART CATH AND CORONARY ANGIOGRAPHY;  Surgeon: Wellington Hampshire, MD;  Location: [place] [facility];  Service: Cardiovascular;  Laterality: N/A;  ?  ? ?Home Medications:  ?Prior to Admission medications   ?Medication Sig Start Date End Date Taking? Authorizing Provider  ?albuterol (PROVENTIL HFA) 108 (90 Base) MCG/ACT inhaler INHALE 2 PUFFS INTO THE LUNGS ONCE EVERY 6 HOURS AS NEEDED FOR WHEEZING OR SHORTNESS OF BREATH. 07/13/21  Yes   ?apixaban (ELIQUIS) 5 MG TABS tablet TAKE 1 TABLET BY MOUTH TWICE DAILY. ?Patient taking differently: Take 5 mg by mouth daily. 07/11/21  Yes   ?carvedilol (COREG) 25 MG tablet Take 1 tablet (25 mg total) by mouth 2 (two) times daily. 07/25/21  Yes Minna Merritts, MD  ?ezetimibe (ZETIA) 10 MG tablet Take 10 mg by mouth daily. 08/26/21  Yes [provider]  ?Cleda Clarks 100 UNIT/ML KwikPen Inject into the skin 3 (three) times daily. Sliding scale 07/11/21  Yes [provider]  ?hydrALAZINE (APRESOLINE) 100 MG tablet  Take 1 tablet (100 mg total) by mouth once every 8 (eight) hours. ?Patient taking differently: Take 100 mg by mouth 2 (two) times daily. 07/25/21  Yes Minna Merritts, MD  ?HYDROcodone-acetaminophen (NORCO/VICODIN) 5-325 MG tablet Take 2 tablets by mouth every 6 (six) hours as needed for moderate pain. 09/29/21 09/29/22 Yes Dew, Erskine Squibb, MD  ?insulin detemir (LEVEMIR FLEXTOUCH) 100 UNIT/ML FlexPen Inject 20 Units into the skin once daily at bedtime. ?Patient taking differently: Inject 25 Units into the skin at bedtime. 07/12/21  Yes   ?Insulin Pen Needle 32G X 4 MM MISC USE AS DIRECTED 06/07/21  Yes Sreenath, Sudheer B, MD  ?isosorbide mononitrate (IMDUR) 30 MG 24 hr tablet Take 1 tablet (30 mg total) by mouth in the morning and at bedtime. 07/25/21  Yes Minna Merritts, MD  ?nitroGLYCERIN (NITROSTAT) 0.4 MG SL tablet Place 1 tablet (0.4 mg total) under the tongue every 5 (five) minutes as needed for chest pain. 07/25/20  Yes Jennye Boroughs, MD  ?rosuvastatin (CRESTOR) 40 MG tablet TAKE 1 TABLET BY MOUTH ONCE DAILY. 08/26/21  Yes Kyrian Stage,  Kathlene November, MD  ?torsemide (DEMADEX) 20 MG tablet Take 20 mg by mouth daily as needed.   Yes [provider]  ?omeprazole (PRILOSEC) 20 MG capsule Take 20 mg by mouth daily. ?Patient not taking: Reported on 10/01/2021 09/13/21   [provider]  ?sertraline (ZOLOFT) 50 MG tablet Take 50 mg by mouth daily. ?Patient not taking: Reported on 10/01/2021 09/13/21   [provider]  ?gabapentin (NEURONTIN) 300 MG capsule Take 1 capsule (300 mg total) by mouth at bedtime for 7 days. 06/07/21 08/18/21  Sidney Ace, MD  ?isosorbide dinitrate (ISORDIL) 30 MG tablet TAKE 1 TABLET BY MOUTH ONCE DAILY. 07/12/21 07/13/21    ? ? ?Inpatient Medications: ?Scheduled Meds: ? carvedilol  25 mg Oral BID  ? Chlorhexidine Gluconate Cloth  6 each Topical Daily  ? ezetimibe  10 mg Oral Daily  ? heparin  5,000 Units Subcutaneous Q8H  ? hydrALAZINE  100 mg Oral Q8H  ? insulin aspart  0-15  Units Subcutaneous Q4H  ? isosorbide mononitrate  30 mg Oral Daily  ? labetalol  10 mg Intravenous Once  ? rosuvastatin  10 mg Oral Daily  ? vancomycin variable dose per unstable renal function (pharmaci

## 2021-10-02 NOTE — Assessment & Plan Note (Addendum)
Cardiology following.  Continue Coreg and Imdur, aspirin.  He denies any anginal symptoms ? ?

## 2021-10-02 NOTE — Assessment & Plan Note (Addendum)
Patient is on Eliquis for his history of DVT in December 2022 ?He has been off of his Eliquis for the past 2 days -resume per vascular surgery ?continue patient on heparin for DVT prophylaxis. ?

## 2021-10-02 NOTE — Transfer of Care (Signed)
Immediate Anesthesia Transfer of Care Note ? ?Patient: Jimmy Olson ? ?Procedure(s) Performed: LIGATION OF ARTERIOVENOUS  FISTULA (Left: Arm Lower) ? ?Patient Location: ICU ? ?Anesthesia Type:MAC and Regional ? ?Level of Consciousness: awake, alert  and oriented ? ?Airway & Oxygen Therapy: Patient Spontanous Breathing and Patient connected to nasal cannula oxygen ? ?Post-op Assessment: Report given to RN and Post -op Vital signs reviewed and stable ? ?Post vital signs: Reviewed and stable ? ?Last Vitals:  ?Vitals Value Taken Time  ?BP    ?Temp    ?Pulse    ?Resp    ?SpO2    ? ? ?Last Pain:  ?Vitals:  ? 10/02/21 0200  ?TempSrc: Oral  ?PainSc:   ?   ? ?  ? ?Complications: No notable events documented. ?

## 2021-10-02 NOTE — Assessment & Plan Note (Addendum)
Blood pressure (!) 154/76, pulse 82, temperature 98.2 ?F (36.8 ?C), temperature source Oral, resp. rate 14, height 5\' 6"  (1.676 m), SpO2 100 %. ?Initially required nicardipine drip. ?continue patient on Imdur, labetalol, Coreg, along with as needed labetalol ?Increase hydralazine to 100 mg p.o. 4 times daily for better blood pressure control ? ?

## 2021-10-02 NOTE — Assessment & Plan Note (Addendum)
None while here.  Monitor on telemetry is clear is 40 to 45% on last cath ? ?

## 2021-10-02 NOTE — H&P (Signed)
[facility] ?History & Physical Update ? ?The patient was interviewed and re-examined.  The patient's previous History and Physical has been reviewed and is unchanged.  There is no change in the plan of care. We plan to proceed with the scheduled procedure. Patient with LEFT Radiocephalic AV fistula with steal syndrome; hand ischemia and evidence of cardiac demand ischemia. Will proceed with Left Radiocephalic AV fistula Ligation. All risks, benefits and alternatives explained via interpreter. Including but not limited to infection, bleeding, limb loss, cardiac issues, further procedures. All questions answered. Understanding expressed. Consent obtained. ? ?Evaristo Bury, MD ? ?10/02/2021, 7:16 AM ?  ?

## 2021-10-02 NOTE — Progress Notes (Signed)
Inpatient Diabetes Program Recommendations ? ?AACE/ADA: New Consensus Statement on Inpatient Glycemic Control (2015) ? ?Target Ranges:  Prepandial:   less than 140 mg/dL ?     Peak postprandial:   less than 180 mg/dL (1-2 hours) ?     Critically ill patients:  140 - 180 mg/dL  ? ?Lab Results  ?Component Value Date  ? GLUCAP 176 (H) 10/02/2021  ? HGBA1C 10.1 (H) 07/14/2021  ? ? Latest Reference Range & Units 10/01/21 19:37  ?Glucose 70 - 99 mg/dL 515 (HH)  ? ? ?Review of Glycemic Control ? Latest Reference Range & Units 10/02/21 08:56 10/02/21 11:33  ?Glucose-Capillary 70 - 99 mg/dL 159 (H) 176 (H)  ? ?Diabetes history: DM  ?Outpatient Diabetes medications:  ?Humalog kwikpen tid with meals ?Levemir 25 units q HS ?Current orders for Inpatient glycemic control:  ?Novolog 0-15 units q 4 hours ?Inpatient Diabetes Program Recommendations:   ?Consider reducing Novolog to sensitive.  Consider also adding Levemir 6 units daily.  ? ?Thanks,  ?Adah Perl, RN, BC-ADM ?Inpatient Diabetes Coordinator ?Pager [phone]  (8a-5p) ? ? ? ?

## 2021-10-02 NOTE — Assessment & Plan Note (Addendum)
Glycemic protocol per every 6 hours Accu-Chek.  Consult diabetic nurse ?

## 2021-10-02 NOTE — Hospital Course (Addendum)
61 y.o. male with medical history significant of diabetes mellitus type 2, end-stage renal disease on CAD, history of DVT presenting with left arm pain swelling and numbness x1 day admitted for left steal syndrome  ? ?4/16: Ligation of left radiocephalic AV fistula ?4/17: Transfer to floor.  Carotid Doppler showed no hemodynamically significant stenosis.  Left upper extremity Doppler negative for DVT ?4/18: Neurology, PT/OT evaluations, MRI of the brain negative for acute stroke ?4/19: dialysis, plan for MRI of Left UE to further evaluate ? ?4/20-21: new onset abdominal pain, worsening, tenderness on exam, improved spontaneously.  GI to follow up outpatient. ?

## 2021-10-02 NOTE — Anesthesia Preprocedure Evaluation (Addendum)
Anesthesia Evaluation  ?Patient identified by MRN, date of birth, ID band ?Patient awake ? ? ? ?Reviewed: ?Allergy & Precautions, NPO status , Patient's Chart, lab work & pertinent test results, reviewed documented beta blocker date and time  ? ?History of Anesthesia Complications ?Negative for: history of anesthetic complications ? ?Airway ?Mallampati: III ? ?TM Distance: >3 FB ?Neck ROM: full ? ? ? Dental ? ?(+) Poor Dentition, Dental Advidsory Given ?  ?Pulmonary ?neg pulmonary ROS,  ?  ?Pulmonary exam normal ? ? ? ? ? ? ? Cardiovascular ?hypertension, Pt. on home beta blockers ?+ angina + CAD, + Past MI (06/2020) and +CHF  ?(-) Cardiac Stents Normal cardiovascular exam(-) dysrhythmias (-) Valvular Problems/Murmurs ? ?06/2020 Cath: nonobs dzs. e.)  LHC 06/03/2021; EF 45-50%; LVEDP 18 mmHg; nonobstructive CAD. f.) b.)  TTE 08/08/2021: EF 40-45%; global HK; mild LVH; GLS -8.6; mild LA dilation. ? ?Left carotid artery stenosis- Carotid Doppler 76/80/8811: 03-15% LICA stenosis ? ?DVT apixaban ?  ?Neuro/Psych ?PSYCHIATRIC DISORDERS Anxiety Depression TIA (06/2021)  ? GI/Hepatic ?negative GI ROS, Neg liver ROS,   ?Endo/Other  ?diabetes, Type 2, Insulin Dependent10.1 A1C ? Renal/GU ?Dialysis and ESRFRenal disease  ?negative genitourinary ?  ?Musculoskeletal ? ? Abdominal ?Normal abdominal exam  (+)   ?Peds ? Hematology ? ?(+) Blood dyscrasia, anemia ,   ?Anesthesia Other Findings ?Past Medical History: ?No date: Anemia ?No date: Anxiety and depression ?No date: ATN (acute tubular necrosis) ([place]) ?No date: CAD (coronary artery disease) ?    Comment:  a.) R/LHC 07/19/2020: 20% pRCA, 30% mRCA, 50% dRCA, 50%  ?             RPAV, 40% pLAD, 70% D1, 60% mLAD; LVEDP 19 mmHg; PA 37/16 ?             (26 mmHg); PCWP 13 mmHg. b.) LHC 06/03/2021: EF 45-50%;  ?             LVEDP 18 mmHg; 40% pLAD, 60% mLAD, 20% pRCA, 30% mRCA,  ?             70% D1, 30% dRCA, 40% RPDA. ?No date: Chronic HFrEF (heart  failure with reduced ejection fraction)  ?(HCC) ?    Comment:  a.) 06/2020 Echo: EF 30-35%, glob HK, GrII DD, nl RV fxn. ?             Mild LAE. b.)  TTE 08/08/2021: EF 40-45%; global HK; mild ?             LVH; GLS -8.6; mild LA dilation. ?05/2021: DVT of axillary vein, acute left ([place]) ?    Comment:  a.) Tx'd with apixaban ?No date: ESRD (end stage renal disease) on dialysis [facility]) ?    Comment:  a.) M-W-F ?03/06/2020: History of 2019 novel coronavirus disease (COVID-19) ?    Comment:  a.) s/p Tx with monoclonal Ab infusion ?No date: HLD (hyperlipidemia) ?12/23/2020: Hyperkalemia ?No date: Hypertension ?08/18/2021: Left carotid artery stenosis ?    Comment:  a.) Carotid Doppler 94/58/5929: 24-46% LICA stenosis ?No date: Long term current use of anticoagulant ?    Comment:  a.) apixaban for DVT ?No date: NICM (nonischemic cardiomyopathy) ([place]) ?    Comment:  a.) 07/2009 MV: EF 48%; b.) 08/2019 Echo: EF 45-50%.  ?             Global HK. Mod LVH. GrIDD; c.) 06/2020 Echo: EF 30-35%,  ?  glob HK, mild LVH, GrII DD, nl RV fxn, mild LAE; d.)  ?             06/2020 Cath: nonobs dzs. e.)  LHC 06/03/2021; EF 45-50%;  ?             LVEDP 18 mmHg; nonobstructive CAD. f.) b.)  TTE  ?             08/08/2021: EF 40-45%; global HK; mild LVH; GLS -8.6;  ?             mild LA dilation. ?07/15/2020: NSTEMI (non-ST elevated myocardial infarction) ([place]) ?    Comment:  a.) hsTnI trended: 124--> 127--> 176 --> 190 --> 200  ?             ng/L. ?No date: Sepsis ([place]) ?No date: Suicidal ideations ?06/2021: TIA (transient ischemic attack) ?No date: Type 2 diabetes mellitus treated with insulin ([place]) ? ?Past Surgical History: ?No date: APPENDECTOMY ?No date: CARDIAC CATHETERIZATION ?06/02/2021: DIALYSIS/PERMA CATHETER INSERTION; N/A ?    Comment:  Procedure: DIALYSIS/PERMA CATHETER INSERTION;  Surgeon:  ?             Algernon Huxley, MD;  Location: [place] [facility] LAB;   ?             Service: Cardiovascular;  Laterality:  N/A; ?06/03/2021: LEFT HEART CATH AND CORONARY ANGIOGRAPHY; N/A ?    Comment:  Procedure: LEFT HEART CATH AND CORONARY ANGIOGRAPHY;   ?             Surgeon: Wellington Hampshire, MD;  Location: [place]  ?             CV LAB;  Service: Cardiovascular;  Laterality: N/A; ?No date: NASAL SINUS SURGERY ?07/19/2020: RIGHT/LEFT HEART CATH AND CORONARY ANGIOGRAPHY; N/A ?    Comment:  Procedure: RIGHT/LEFT HEART CATH AND CORONARY  ?             ANGIOGRAPHY;  Surgeon: Wellington Hampshire, MD;  Location:  ?             [place] CV LAB;  Service: Cardiovascular;   ?             Laterality: N/A; ? ? ? ? Reproductive/Obstetrics ?negative OB ROS ? ?  ? ? ? ? ? ? ? ? ? ? ? ? ? ?  ?  ? ? ? ? ? ? ? ? ?Anesthesia Physical ? ?Anesthesia Plan ? ?ASA: 3 and emergent ? ?Anesthesia Plan: General  ? ?Post-op Pain Management: Regional block*  ? ?Induction: Intravenous ? ?PONV Risk Score and Plan: 2 and Propofol infusion and TIVA ? ?Airway Management Planned: Natural Airway ? ?Additional Equipment:  ? ?Intra-op Plan:  ? ?Post-operative Plan:  ? ?Informed Consent: I have reviewed the patients History and Physical, chart, labs and discussed the procedure including the risks, benefits and alternatives for the proposed anesthesia with the patient or authorized representative who has indicated his/her understanding and acceptance.  ? ? ? ?Dental advisory given ? ?Plan Discussed with: Anesthesiologist, CRNA and Surgeon ? ?Anesthesia Plan Comments:   ? ? ? ? ? ?Anesthesia Quick Evaluation ? ?

## 2021-10-02 NOTE — Assessment & Plan Note (Signed)
Blood pressure (!) 154/76, pulse 82, temperature 98.2 ?F (36.8 ?C), temperature source Oral, resp. rate 14, height 5\' 6"  (1.676 m), SpO2 100 %. ?SpO2: 100 % ? ? ?

## 2021-10-02 NOTE — Progress Notes (Signed)
?  Progress Note ? ? ?Patient: Jimmy Olson SRP:[id number] DOB: 06-06-1961 DOA: 10/01/2021     1 ?DOS: the patient was seen and examined on 10/02/2021 ?  ?Brief hospital course: ?61 y.o. male with medical history significant of diabetes mellitus type 2, end-stage renal disease on CAD, history of DVT presenting with left arm pain swelling and numbness x1 day admitted for left steal syndrome  ? ?4/16: Ligation of left radiocephalic AV fistula ? ? ?Assessment and Plan: ?* Diffuse pain in left upper extremity ?Much improved status post ligation for left steal syndrome ? ? ? ?Hypertensive emergency ?Blood pressure (!) 154/76, pulse 82, temperature 98.2 ?F (36.8 ?C), temperature source Oral, resp. rate 14, height 5\' 6"  (1.676 m), SpO2 100 %. ?Initially required nicardipine drip. ?continue patient on Imdur, labetalol, Coreg, along with as needed labetalol ?Increase hydralazine to 100 mg p.o. 4 times daily for better blood pressure control ? ? ?HFrEF (heart failure with reduced ejection fraction) ([place]) ?Strict I's and O's. ?Patient is euvolemic.  Next HD session is on Monday. ?Continue patient on Coreg, Imdur, labetalol. ?Dr. Rockey Situ cardiology is following ? ?Chest pain ?None while here.  Monitor on telemetry is clear is 40 to 45% on last cath ? ? ?Coronary artery disease involving native coronary artery of native heart without angina pectoris ?Cardiology following.  Continue Coreg and Imdur, aspirin.  He denies any anginal symptoms ? ? ?Type 2 diabetes mellitus with hyperlipidemia ([place]) ?Glycemic protocol per every 6 hours Accu-Chek.  Consult diabetic nurse ? ?ESRD on hemodialysis ([place]) ?Next HD on Monday per nephrology ? ?History of DVT (deep vein thrombosis) ?Patient is on Eliquis for his history of DVT in December 2022 ?He has been off of his Eliquis for the past 2 days -resume per vascular surgery ?continue patient on heparin for DVT prophylaxis. ? ? ? ? ?  ? ?Subjective: Minimal pain in the left arm.  No other symptoms.   Son and wife at the bedside.  Patient would like ice chips ? ?Physical Exam: ?Vitals:  ? 10/02/21 1030 10/02/21 1100 10/02/21 1130 10/02/21 1200  ?BP: (!) 189/97 (!) 183/91 (!) 167/81 (!) 164/93  ?Pulse: 77 75 79 86  ?Resp: 16 10 (!) 21 (!) 22  ?Temp:    97.8 ?F (36.6 ?C)  ?TempSrc:      ?SpO2: 96% 93% 94% 96%  ?Height:      ? ?61 year old male lying in the bed comfortably without any acute distress ?Lungs clear to auscultation bilaterally, no wheezing rales rhonchi or crepitation ?Cardiovascular S1-S2 normal, no murmur rubs or gallop ?Abdomen soft, benign ?Neuro alert and oriented, nonfocal ?Psych normal mood and affect ?HD access-right IJ tunneled dialysis catheter ? ?Data Reviewed: ? ?Hemoglobin 10.2 ? ?Family Communication: Son and wife updated at bedside ? ?Disposition: ?Status is: Inpatient ?Remains inpatient appropriate because: Needs dialysis tomorrow and better blood pressure control ? ? Planned Discharge Destination: Home with Home Health ? ? ? DVT prophylaxis-subcu heparin ?Time spent: 35 minutes ? ?Author: ?Max Sane, MD ?10/02/2021 12:39 PM ? ?For on call review [email or web address].  ?

## 2021-10-02 NOTE — Progress Notes (Signed)
[facility] ?[place], [name] ?10/02/21 ? ?Subjective:  ? ?LOS: 1 ? ?Patient was admitted for steal syndrome of the left hand with acute ischemia.  He underwent emergent ligation of the AV fistula this morning.  States that pain in the left hand feels better. ?Last HD was on Friday as per his routine schedule. ?Denies any shortness of breath at present. ?No leg edema. ? ?Objective:  ?Vital signs in last 24 hours:  ?Temp:  [98.2 ?F (36.8 ?C)-98.7 ?F (37.1 ?C)] 98.2 ?F (36.8 ?C) (04/16 0200) ?Pulse Rate:  [80-99] 80 (04/16 0930) ?Resp:  [14-27] 22 (04/16 0930) ?BP: (145-223)/(72-115) 193/100 (04/16 0930) ?SpO2:  [91 %-100 %] 98 % (04/16 0930) ? ?Weight change:  ?There were no vitals filed for this visit. ? ?Intake/Output: ?  ? ?Intake/Output Summary (Last 24 hours) at 10/02/2021 0954 ?Last data filed at 10/02/2021 0820 ?Gross per 24 hour  ?Intake 181.97 ml  ?Output 5 ml  ?Net 176.97 ml  ? ? ?Physical Exam: ?General:  No acute distress, laying in the bed  ?HEENT  anicteric, moist oral mucous membrane  ?Pulm/lungs  normal breathing effort, lungs are clear to auscultation  ?CVS/Heart  regular rhythm, no rub or gallop  ?Abdomen:   Soft, nontender  ?Extremities:  No peripheral edema  ?Neurologic:  Alert, oriented, able to follow commands  ?Skin:  No acute rashes  ?Right IJ tunneled dialysis catheter ? ? ? ?Basic Metabolic Panel: ? ?Recent Labs  ?Lab 09/29/21 ?1216 10/01/21 ?1937  ?NA 139 133*  ?K 4.4 4.4  ?CL 102 96*  ?CO2  --  28  ?GLUCOSE 240* 515*  ?BUN 32* 45*  ?CREATININE 3.90* 4.06*  ?CALCIUM  --  9.0  ? ? ? ?CBC: ?Recent Labs  ?Lab 09/29/21 ?1216 10/01/21 ?1937  ?WBC  --  5.4  ?NEUTROABS  --  4.1  ?HGB 10.9* 10.2*  ?HCT 32.0* 31.6*  ?MCV  --  88.3  ?PLT  --  176  ? ? ?  ?Lab Results  ?Component Value Date  ? HEPBSAG NON REACTIVE 08/08/2021  ? HEPBSAB NON REACTIVE 08/08/2021  ? ? ? ? ?Microbiology: ? ?Recent Results (from the past 240 hour(s))  ?Culture, blood (Routine X 2) w Reflex to ID Panel      Status: None (Preliminary result)  ? Collection Time: 10/01/21 10:29 PM  ? Specimen: BLOOD  ?Result Value Ref Range Status  ? Specimen Description BLOOD BLOOD RIGHT ARM  Final  ? Special Requests   Final  ?  BOTTLES DRAWN AEROBIC AND ANAEROBIC Blood Culture adequate volume  ? Culture   Final  ?  NO GROWTH < 12 HOURS ?Performed at [facility], [address], [place], [place] [address] ?  ? Report Status PENDING  Incomplete  ?MRSA Next Gen by PCR, Nasal     Status: None  ? Collection Time: 10/02/21  1:04 AM  ? Specimen: Nasal Mucosa; Nasal Swab  ?Result Value Ref Range Status  ? MRSA by PCR Next Gen NOT DETECTED NOT DETECTED Final  ?  Comment: (NOTE) ?The GeneXpert MRSA Assay (FDA approved for NASAL specimens only), ?is one component of a comprehensive MRSA colonization surveillance ?program. It is not intended to diagnose MRSA infection nor to guide ?or monitor treatment for MRSA infections. ?Test performance is not FDA approved in patients less than 2 years ?old. ?Performed at [facility], [place], ?[name] [address] ?  ? ? ?Coagulation Studies: ?No results for input(s): LABPROT, INR  in the last 72 hours. ? ?Urinalysis: ?No results for input(s): COLORURINE, LABSPEC, Martin, GLUCOSEU, HGBUR, BILIRUBINUR, KETONESUR, PROTEINUR, UROBILINOGEN, NITRITE, LEUKOCYTESUR in the last 72 hours. ? ?Invalid input(s): APPERANCEUR  ? ? ?Imaging: ?CT Angio Chest Pulmonary Embolism (PE) W or WO Contrast ? ?Result Date: 10/02/2021 ?CLINICAL DATA:  Pulmonary embolism (PE) suspected, high prob. Left arm pain EXAM: CT ANGIOGRAPHY CHEST WITH CONTRAST TECHNIQUE: Multidetector CT imaging of the chest was performed using the standard protocol during bolus administration of intravenous contrast. Multiplanar CT image reconstructions and MIPs were obtained to evaluate the vascular anatomy. RADIATION DOSE REDUCTION: This exam was performed according to the departmental dose-optimization program which  includes automated exposure control, adjustment of the mA and/or kV according to patient size and/or use of iterative reconstruction technique. CONTRAST:  40mL OMNIPAQUE IOHEXOL 350 MG/ML SOLN COMPARISON:  None. FINDINGS: Cardiovascular: There is adequate opacification of the pulmonary arterial tree. No intraluminal filling defect identified to suggest acute pulmonary embolism. The central pulmonary arteries are of normal caliber. Cardiac size is mildly enlarged with left ventricular hypertrophy noted. Right internal jugular hemodialysis catheter tip noted within the right atrium. Extensive multi-vessel coronary artery calcification. No pericardial effusion. Minimal atherosclerotic calcification within the thoracic aorta. No aortic aneurysm. Bovine arch anatomy with wide patency of the arch vasculature proximally. Mediastinum/Nodes: No enlarged mediastinal, hilar, or axillary lymph nodes. Thyroid gland, trachea, and esophagus demonstrate no significant findings. Lungs/Pleura: Mild bibasilar atelectasis. The lungs are otherwise clear. No pneumothorax or pleural effusion. No central obstructing lesion. Upper Abdomen: No acute abnormality. Musculoskeletal: No chest wall abnormality. No acute or significant osseous findings. Review of the MIP images confirms the above findings. IMPRESSION: No pulmonary embolism. Extensive multi-vessel coronary artery calcification. Mild cardiomegaly with left ventricular hypertrophy. Aortic Atherosclerosis (ICD10-I70.0). Electronically Signed   By: Fidela Salisbury M.D.   On: 10/02/2021 00:59  ? ?DG Chest Portable 1 View ? ?Result Date: 10/01/2021 ?CLINICAL DATA:  Chest pain EXAM: PORTABLE CHEST 1 VIEW COMPARISON:  09/06/2021 FINDINGS: Right-sided central venous catheter tip over the cavoatrial region. No focal opacity, pleural effusion or pneumothorax. Normal cardiac size. IMPRESSION: No active disease. Electronically Signed   By: Donavan Foil M.D.   On: 10/01/2021 22:08  ? ?[place] OR NERVE  BLOCK-IMAGE ONLY [facility]) ? ?Result Date: 10/02/2021 ?There is no interpretation for this exam.  This order is for images obtained during a surgical procedure.  Please See "Surgeries" Tab for more information regarding the procedure.  ? ?CT Extrem Up Entire Arm L W/CM ? ?Result Date: 10/02/2021 ?CLINICAL DATA:  Upper extremity pain, non-localized (Ped [id number]). End-stage renal disease status post dialysis fistula creation 2 days prior. Left arm swelling. EXAM: CT OF THE UPPER LEFT EXTREMITY WITH CONTRAST TECHNIQUE: Multidetector CT imaging of the upper left extremity was performed according to the standard protocol following intravenous contrast administration. RADIATION DOSE REDUCTION: This exam was performed according to the departmental dose-optimization program which includes automated exposure control, adjustment of the mA and/or kV according to patient size and/or use of iterative reconstruction technique. CONTRAST:  80mL OMNIPAQUE IOHEXOL 350 MG/ML SOLN COMPARISON:  None. FINDINGS: Bones/Joint/Cartilage Normal alignment. No acute fracture or dislocation. Mild glenohumeral degenerative arthritis. Mild ulnar humeral degenerative arthritis with asymmetric joint space narrowing. Degenerative enthesopathy at the insertion of the triceps tendon upon the olecranon. Ligaments Suboptimally assessed by CT. Muscles and Tendons Unremarkable Soft tissues A left radiocephalic fistula seen at the wrist with surrounding subcutaneous gas likely related to reported recent creation there is moderate subcutaneous edema  within the left forearm and left hand. The venous vasculature of the left upper extremity is patent. Moderate arteriosclerosis involving the peripheral left ulnar artery. IMPRESSION: Status post left radiocephalic fistula creation. Moderate subcutaneous edema of the left forearm and left hand may relate to resultant increased venous pressure, but is nonspecific. No central venous obstruction identified within the left  upper extremity. Electronically Signed   By: Fidela Salisbury M.D.   On: 10/02/2021 01:12   ? ? ?Medications:  ? ? sodium chloride 10 mL/hr at 10/02/21 0745  ? ceFEPime (MAXIPIME) IV 1 g (10/02/21 0157)  ? niCAR

## 2021-10-02 NOTE — Anesthesia Procedure Notes (Signed)
Anesthesia Regional Block: Supraclavicular block  ? ?Pre-Anesthetic Checklist: , timeout performed,  Correct Patient, Correct Site, Correct Laterality,  Correct Procedure, Correct Position, site marked,  Risks and benefits discussed,  Surgical consent,  Pre-op evaluation,  At surgeon's request and post-op pain management ? ?Laterality: Left and Upper ? ?Prep: chloraprep     ?  ?Needles:  ?Injection technique: Single-shot ? ?Needle Type: Stimiplex   ? ? ?Needle Length: 5cm  ?Needle Gauge: 22  ? ? ? ?Additional Needles: ? ? ?Procedures:, nerve stimulator,,, ultrasound used (permanent image in chart),,    ?Narrative:  ?Start time: 10/02/2021 7:41 AM ?End time: 10/02/2021 7:47 AM ?Injection made incrementally with aspirations every 5 mL. ? ?Performed by: Personally  ?Anesthesiologist: Martha Clan, MD ?CRNA: Esaw Grandchild, CRNA ? ?Additional Notes: ?Functioning IV was confirmed and monitors were applied.  A 22mm 22ga Stimuplex needle was used. Sterile prep and drape,hand hygiene and sterile gloves were used.  Negative aspiration and negative test dose prior to incremental administration of local anesthetic. The patient tolerated the procedure well. ? ? ? ? ? ?

## 2021-10-02 NOTE — Op Note (Signed)
[facility] ? ? ?OPERATIVE NOTE ? ?DATE: 10/02/2021 ? ?PRE-OPERATIVE DIAGNOSIS: ESRD, steal syndrome LEFT hand with acute ischemia with patent radiocephalic AVF; Cardiac Ischemia ? ?POST-OPERATIVE DIAGNOSIS: same as above ? ?PROCEDURE: ?1.   Ligation of LEFT Radiocephalic AV fistula ? ?SURGEON: Kayleb Warshaw, Mariann Barter, MD ? ?ASSISTANT(S): none ? ?ANESTHESIA: Regional block, sedation ? ?ESTIMATED BLOOD LOSS: minimal ? ?FINDING(S): ?1.  Radial, ulnar and palmar arch pulses post ligation ? ?SPECIMEN(S):  None ? ?INDICATIONS:   ?Patient is a 61 y.o.male who presents with steal syndrome and acute ischemia of the LEFT hand, with cardiac demand ischemia and a patent LEFT radiocephalic AV fistula. It was felt that a ligation of the  fistula would help the steal symptoms, prevent further hand ischemia and cardiac demand ischemia. Risks and benefits were discussed and the patient was agreeable to proceed.. ? ?DESCRIPTION: ?After obtaining full informed written consent, the patient was brought back to the operating room and placed supine upon the operating table.  The patient received IV antibiotics prior to induction.  After obtaining adequate regional block anesthesia, the patient was prepped and draped in the standard fashion.The previous incision for the radiocephalic anastomosis was opened with a 15 blade. A small retractor help facilitate our exposure. I then dissected out the radiocephalic fistula with care to avoid injury to the surrounding structures. The cephalic vein just proximal to the anastomosis was then identified. I occluded the fistula and ensured continued patency of the radial,ulnar and palmar arch with doppler. I then doubly ligated the cephalic vein portion of the fistula with 2-0 silk ties. The wound was then irrigated and closed with a 3-0 Vicryl and a 4-0 Monocryl. Dermabond was placed as a dressing. The patient was taken to the recovery room in stable condition having tolerated the  procedure well. ? ?COMPLICATIONS: None ? ?CONDITION: Stable ? ? ?Jamesetta So A ?10/02/2021 ?8:32 AM ? ? ?This note was created with Dragon Medical transcription system. Any errors in dictation are purely unintentional.  ?

## 2021-10-02 NOTE — Assessment & Plan Note (Addendum)
Next HD on Monday per nephrology ?

## 2021-10-02 NOTE — Progress Notes (Addendum)
0700 Transported to surgery via bed with OR nurse and Dr. Lorenso Courier. 0830 Back from surgery. CRNA reports the patient has Versed,Fentanyl and a subscapular block for procedure. Patient complains of numbness in that arm but no pain. All pulses doppler in left arm . Rested most of the day. 1600 Patient able to swallow without issues and is on room air now. Bath done and transferred to chair. Diet ordered. Patient still complains of pain in the left arm. Provider secure chatted concerning pain. Arm repositioned and pain decreased. 1816 Ate most of dinner. Given prn for pain as requested. Remains in NSR. Blood pressure mostly controlled today. ?

## 2021-10-03 ENCOUNTER — Inpatient Hospital Stay: Payer: Medicare Other

## 2021-10-03 ENCOUNTER — Encounter: Payer: Self-pay | Admitting: Vascular Surgery

## 2021-10-03 DIAGNOSIS — N186 End stage renal disease: Secondary | ICD-10-CM | POA: Diagnosis not present

## 2021-10-03 DIAGNOSIS — I5020 Unspecified systolic (congestive) heart failure: Secondary | ICD-10-CM | POA: Diagnosis not present

## 2021-10-03 DIAGNOSIS — I161 Hypertensive emergency: Secondary | ICD-10-CM | POA: Diagnosis not present

## 2021-10-03 DIAGNOSIS — M79602 Pain in left arm: Secondary | ICD-10-CM | POA: Diagnosis not present

## 2021-10-03 DIAGNOSIS — I248 Other forms of acute ischemic heart disease: Secondary | ICD-10-CM | POA: Diagnosis not present

## 2021-10-03 LAB — CBC
HCT: 33.8 % — ABNORMAL LOW (ref 39.0–52.0)
Hemoglobin: 11.1 g/dL — ABNORMAL LOW (ref 13.0–17.0)
MCH: 29.1 pg (ref 26.0–34.0)
MCHC: 32.8 g/dL (ref 30.0–36.0)
MCV: 88.5 fL (ref 80.0–100.0)
Platelets: 176 10*3/uL (ref 150–400)
RBC: 3.82 MIL/uL — ABNORMAL LOW (ref 4.22–5.81)
RDW: 14.6 % (ref 11.5–15.5)
WBC: 6.7 10*3/uL (ref 4.0–10.5)
nRBC: 0 % (ref 0.0–0.2)

## 2021-10-03 LAB — BASIC METABOLIC PANEL
Anion gap: 11 (ref 5–15)
BUN: 58 mg/dL — ABNORMAL HIGH (ref 6–20)
CO2: 25 mmol/L (ref 22–32)
Calcium: 8.8 mg/dL — ABNORMAL LOW (ref 8.9–10.3)
Chloride: 99 mmol/L (ref 98–111)
Creatinine, Ser: 5.37 mg/dL — ABNORMAL HIGH (ref 0.61–1.24)
GFR, Estimated: 12 mL/min — ABNORMAL LOW (ref >60)
Glucose, Bld: 241 mg/dL — ABNORMAL HIGH (ref 70–99)
Potassium: 4.3 mmol/L (ref 3.5–5.1)
Sodium: 135 mmol/L (ref 135–145)

## 2021-10-03 LAB — GLUCOSE, CAPILLARY
Glucose-Capillary: 115 mg/dL — ABNORMAL HIGH (ref 70–99)
Glucose-Capillary: 168 mg/dL — ABNORMAL HIGH (ref 70–99)
Glucose-Capillary: 236 mg/dL — ABNORMAL HIGH (ref 70–99)
Glucose-Capillary: 258 mg/dL — ABNORMAL HIGH (ref 70–99)
Glucose-Capillary: 259 mg/dL — ABNORMAL HIGH (ref 70–99)
Glucose-Capillary: 362 mg/dL — ABNORMAL HIGH (ref 70–99)
Glucose-Capillary: 411 mg/dL — ABNORMAL HIGH (ref 70–99)
Glucose-Capillary: 52 mg/dL — ABNORMAL LOW (ref 70–99)

## 2021-10-03 MED ORDER — INSULIN ASPART 100 UNIT/ML IJ SOLN
0.0000 [IU] | INTRAMUSCULAR | Status: DC
  Administered 2021-10-03: 5 [IU] via SUBCUTANEOUS
  Administered 2021-10-03: 9 [IU] via SUBCUTANEOUS
  Administered 2021-10-04: 2 [IU] via SUBCUTANEOUS
  Administered 2021-10-04 (×2): 5 [IU] via SUBCUTANEOUS
  Administered 2021-10-04 – 2021-10-05 (×2): 3 [IU] via SUBCUTANEOUS
  Administered 2021-10-05: 2 [IU] via SUBCUTANEOUS
  Administered 2021-10-05: 1 [IU] via SUBCUTANEOUS
  Administered 2021-10-05: 3 [IU] via SUBCUTANEOUS
  Administered 2021-10-05: 1 [IU] via SUBCUTANEOUS
  Administered 2021-10-06 (×2): 3 [IU] via SUBCUTANEOUS
  Administered 2021-10-06 – 2021-10-07 (×3): 2 [IU] via SUBCUTANEOUS
  Administered 2021-10-07 (×2): 3 [IU] via SUBCUTANEOUS
  Administered 2021-10-08: 2 [IU] via SUBCUTANEOUS
  Filled 2021-10-03 (×20): qty 1

## 2021-10-03 MED ORDER — DEXTROSE 50 % IV SOLN
INTRAVENOUS | Status: AC
  Administered 2021-10-03: 12.5 g via INTRAVENOUS
  Filled 2021-10-03: qty 50

## 2021-10-03 MED ORDER — APIXABAN 5 MG PO TABS
5.0000 mg | ORAL_TABLET | Freq: Two times a day (BID) | ORAL | Status: DC
  Administered 2021-10-03 – 2021-10-08 (×9): 5 mg via ORAL
  Filled 2021-10-03 (×10): qty 1

## 2021-10-03 MED ORDER — HEPARIN SODIUM (PORCINE) 1000 UNIT/ML IJ SOLN
INTRAMUSCULAR | Status: AC
  Filled 2021-10-03: qty 10

## 2021-10-03 MED ORDER — DEXTROSE 50 % IV SOLN: 12.5000 g | INTRAVENOUS | Status: AC

## 2021-10-03 MED ORDER — EPOETIN ALFA 4000 UNIT/ML IJ SOLN
INTRAMUSCULAR | Status: AC
  Filled 2021-10-03: qty 1

## 2021-10-03 MED ORDER — IOHEXOL 300 MG/ML  SOLN
75.0000 mL | Freq: Once | INTRAMUSCULAR | Status: AC | PRN
  Administered 2021-10-03: 75 mL via INTRAVENOUS

## 2021-10-03 NOTE — Progress Notes (Signed)
[facility] ?[place], [name] ?10/03/21 ? ?Subjective:  ? ?LOS: 2 ? ?Patient was admitted for steal syndrome of the left hand with acute ischemia.  He underwent emergent ligation of the AV fistula this morning.  States that pain in the left hand feels better. ?Last HD was on Friday as per his routine schedule. ?Denies any shortness of breath at present. ?No leg edema. ?Daughter and wife in the room with him ? ?Objective:  ?Vital signs in last 24 hours:  ?Temp:  [97.5 ?F (36.4 ?C)-98.5 ?F (36.9 ?C)] 98 ?F (36.7 ?C) (04/17 1936) ?Pulse Rate:  [68-88] 76 (04/17 1936) ?Resp:  [10-23] 18 (04/17 1936) ?BP: (126-196)/(65-120) 196/88 (04/17 1936) ?SpO2:  [95 %-100 %] 99 % (04/17 1936) ?Weight:  [68.2 kg] 68.2 kg (04/17 1332) ? ?Weight change:  ?Filed Weights  ? 10/03/21 1332  ?Weight: 68.2 kg  ? ? ?Intake/Output: ?  ? ?Intake/Output Summary (Last 24 hours) at 10/03/2021 1939 ?Last data filed at 10/03/2021 1400 ?Gross per 24 hour  ?Intake 360 ml  ?Output 800 ml  ?Net -440 ml  ? ? ? ?Physical Exam: ?General:  No acute distress, laying in the bed  ?HEENT  anicteric, moist oral mucous membrane  ?Pulm/lungs  normal breathing effort, lungs are clear to auscultation  ?CVS/Heart  regular rhythm, no rub or gallop  ?Abdomen:   Soft, nontender  ?Extremities:  No peripheral edema, left hand warm  ?Neurologic:  Alert, oriented, able to follow commands  ?Skin:  No acute rashes  ?Right IJ tunneled dialysis catheter ? ? ? ?Basic Metabolic Panel: ? ?Recent Labs  ?Lab 09/29/21 ?1216 10/01/21 ?1937 10/03/21 ?0506  ?NA 139 133* 135  ?K 4.4 4.4 4.3  ?CL 102 96* 99  ?CO2  --  28 25  ?GLUCOSE 240* 515* 241*  ?BUN 32* 45* 58*  ?CREATININE 3.90* 4.06* 5.37*  ?CALCIUM  --  9.0 8.8*  ? ? ? ? ?CBC: ?Recent Labs  ?Lab 09/29/21 ?1216 10/01/21 ?1937 10/03/21 ?0506  ?WBC  --  5.4 6.7  ?NEUTROABS  --  4.1  --   ?HGB 10.9* 10.2* 11.1*  ?HCT 32.0* 31.6* 33.8*  ?MCV  --  88.3 88.5  ?PLT  --  176 176  ? ? ? ?  ?Lab Results  ?Component Value  Date  ? HEPBSAG NON REACTIVE 08/08/2021  ? HEPBSAB NON REACTIVE 08/08/2021  ? ? ? ? ?Microbiology: ? ?Recent Results (from the past 240 hour(s))  ?Culture, blood (Routine X 2) w Reflex to ID Panel     Status: None (Preliminary result)  ? Collection Time: 10/01/21 10:29 PM  ? Specimen: BLOOD  ?Result Value Ref Range Status  ? Specimen Description BLOOD BLOOD RIGHT ARM  Final  ? Special Requests   Final  ?  BOTTLES DRAWN AEROBIC AND ANAEROBIC Blood Culture adequate volume  ? Culture   Final  ?  NO GROWTH 2 DAYS ?Performed at [facility], [address], [place], [place] [address] ?  ? Report Status PENDING  Incomplete  ?MRSA Next Gen by PCR, Nasal     Status: None  ? Collection Time: 10/02/21  1:04 AM  ? Specimen: Nasal Mucosa; Nasal Swab  ?Result Value Ref Range Status  ? MRSA by PCR Next Gen NOT DETECTED NOT DETECTED Final  ?  Comment: (NOTE) ?The GeneXpert MRSA Assay (FDA approved for NASAL specimens only), ?is one component of a comprehensive MRSA colonization surveillance ?program. It is not intended to diagnose MRSA infection nor  to guide ?or monitor treatment for MRSA infections. ?Test performance is not FDA approved in patients less than 2 years ?old. ?Performed at [facility], [place], ?[name] [address] ?  ?Culture, blood (Routine X 2) w Reflex to ID Panel     Status: None (Preliminary result)  ? Collection Time: 10/02/21 10:28 PM  ? Specimen: BLOOD  ?Result Value Ref Range Status  ? Specimen Description BLOOD RIGHT HAND  Final  ? Special Requests   Final  ?  BOTTLES DRAWN AEROBIC AND ANAEROBIC Blood Culture adequate volume  ? Culture   Final  ?  NO GROWTH < 12 HOURS ?Performed at [facility], [address], [place], [place] [address] ?  ? Report Status PENDING  Incomplete  ? ? ?Coagulation Studies: ?No results for input(s): LABPROT, INR in the last 72 hours. ? ?Urinalysis: ?No results for input(s): COLORURINE, LABSPEC, [place], GLUCOSEU, HGBUR, BILIRUBINUR,  KETONESUR, PROTEINUR, UROBILINOGEN, NITRITE, LEUKOCYTESUR in the last 72 hours. ? ?Invalid input(s): APPERANCEUR  ? ? ?Imaging: ?CT HEAD W & WO CONTRAST (5MM) ? ?Result Date: 10/03/2021 ?CLINICAL DATA:  Transient ischemic attack (TIA) EXAM: CT HEAD WITHOUT AND WITH CONTRAST TECHNIQUE: Contiguous axial images were obtained from the base of the skull through the vertex without and with intravenous contrast. RADIATION DOSE REDUCTION: This exam was performed according to the departmental dose-optimization program which includes automated exposure control, adjustment of the mA and/or kV according to patient size and/or use of iterative reconstruction technique. CONTRAST:  80mL OMNIPAQUE IOHEXOL 300 MG/ML  SOLN COMPARISON:  CT head 09/06/2021 FINDINGS: Brain: There is no acute intracranial hemorrhage, mass, mass effect, or edema. No abnormal enhancement. Gray-white differentiation is preserved. There is no extra-axial fluid collection. Ventricles and sulci are within normal limits in size and configuration. Vascular: There is atherosclerotic calcification at the skull base. Skull: Calvarium is unremarkable. Sinuses/Orbits: Paranasal sinus mucosal thickening. Orbits are unremarkable. Other: None. IMPRESSION: No acute intracranial hemorrhage or evidence of acute infarction. No mass or enhancement. Electronically Signed   By: Macy Mis M.D.   On: 10/03/2021 07:46  ? ?CT Angio Chest Pulmonary Embolism (PE) W or WO Contrast ? ?Result Date: 10/02/2021 ?CLINICAL DATA:  Pulmonary embolism (PE) suspected, high prob. Left arm pain EXAM: CT ANGIOGRAPHY CHEST WITH CONTRAST TECHNIQUE: Multidetector CT imaging of the chest was performed using the standard protocol during bolus administration of intravenous contrast. Multiplanar CT image reconstructions and MIPs were obtained to evaluate the vascular anatomy. RADIATION DOSE REDUCTION: This exam was performed according to the departmental dose-optimization program which includes  automated exposure control, adjustment of the mA and/or kV according to patient size and/or use of iterative reconstruction technique. CONTRAST:  66mL OMNIPAQUE IOHEXOL 350 MG/ML SOLN COMPARISON:  None. FINDINGS: Cardiovascular: There is adequate opacification of the pulmonary arterial tree. No intraluminal filling defect identified to suggest acute pulmonary embolism. The central pulmonary arteries are of normal caliber. Cardiac size is mildly enlarged with left ventricular hypertrophy noted. Right internal jugular hemodialysis catheter tip noted within the right atrium. Extensive multi-vessel coronary artery calcification. No pericardial effusion. Minimal atherosclerotic calcification within the thoracic aorta. No aortic aneurysm. Bovine arch anatomy with wide patency of the arch vasculature proximally. Mediastinum/Nodes: No enlarged mediastinal, hilar, or axillary lymph nodes. Thyroid gland, trachea, and esophagus demonstrate no significant findings. Lungs/Pleura: Mild bibasilar atelectasis. The lungs are otherwise clear. No pneumothorax or pleural effusion. No central obstructing lesion. Upper Abdomen: No acute abnormality. Musculoskeletal: No chest wall abnormality. No acute or  significant osseous findings. Review of the MIP images confirms the above findings. IMPRESSION: No pulmonary embolism. Extensive multi-vessel coronary artery calcification. Mild cardiomegaly with left ventricular hypertrophy. Aortic Atherosclerosis (ICD10-I70.0). Electronically Signed   By: Fidela Salisbury M.D.   On: 10/02/2021 00:59  ? ?US Carotid Bilateral ? ?Result Date: 10/03/2021 ?CLINICAL DATA:  Left arm weakness Left upper extremity ischemia EXAM: BILATERAL CAROTID DUPLEX ULTRASOUND TECHNIQUE: Pearline Cables scale imaging, color Doppler and duplex ultrasound were performed of bilateral carotid and vertebral arteries in the neck. COMPARISON:  None. FINDINGS: Criteria: Quantification of carotid stenosis is based on velocity parameters that  correlate the residual internal carotid diameter with NASCET-based stenosis levels, using the diameter of the distal internal carotid lumen as the denominator for stenosis measurement. The following velocity mea

## 2021-10-03 NOTE — Progress Notes (Signed)
OT Cancellation Note ? ?Patient Details ?Name: Jimmy Olson ?MRN: [id number] ?DOB: 1961/04/27 ? ? ?Cancelled Treatment:    Reason Eval/Treat Not Completed: Medical issues which prohibited therapy. Consult received and chart reveiwed.  Patient noted with doppler to L UE pending to rule out DVT.  Per guidelines, will hold therapy evaluations until test complete/results received.  Will continue to follow and initiate as appropriate.  ? ?Dessie Coma, M.S. OTR/L  ?10/03/21, 1:52 PM  ?ascom [phone] ? ?

## 2021-10-03 NOTE — Assessment & Plan Note (Addendum)
None while here.  Monitor on telemetry-ejection fraction 40 to 45% on last cath ? ?

## 2021-10-03 NOTE — Assessment & Plan Note (Addendum)
Strict I's and O's. ?Getting dialyzed today ?Continue patient on Coreg, Imdur, labetalol. ?cardiology is following ?

## 2021-10-03 NOTE — Progress Notes (Signed)
Patient states arm and hand pain have improved. Denies further chest pain. Motor/sensory improved. Hand/arm warm. Still some weakness in arm and hand. Confusion and some lethargy. States he does not feel well. Aware of hypoglycemia-treated. ?Will obtain Head CT, carotid duplex to ensure no TIA/CVA as he has a history of similar symptoms in the past. Will obtain LEFT arm venous and arterial studies as [place] was unable to perform this weekend. ?

## 2021-10-03 NOTE — Progress Notes (Signed)
Patient CBG noted at 52. Patient was alert and disoriented. Diaphoretic. He was given a Sprite and was able to consume the entire drink and D50. He eventually came around and stated that he felt really bad and had no idea why. He was told that he was hypoglycemic but that his blood sugar was coming up. Upon recheck, CBG was 158. Patient was bathed and bed linens changed. Provider notified and changed sliding scale. Patient is now alert and oriented and in no distress at this time. Call bell in reach. Will continue to monitor.  ?

## 2021-10-03 NOTE — Assessment & Plan Note (Signed)
Patient is on Eliquis for his history of DVT in December 2022 ?Eliquis restarted ?

## 2021-10-03 NOTE — Progress Notes (Signed)
?  Progress Note ? ? ?Patient: Jimmy Olson OAC:[id number] DOB: 1961/02/13 DOA: 10/01/2021     2 ?DOS: the patient was seen and examined on 10/03/2021 ?  ?Brief hospital course: ?61 y.o. male with medical history significant of diabetes mellitus type 2, end-stage renal disease on CAD, history of DVT presenting with left arm pain swelling and numbness x1 day admitted for left steal syndrome  ? ?4/16: Ligation of left radiocephalic AV fistula ?4/17: Transfer to floor.  Carotid Doppler showed no hemodynamically significant stenosis.  Left upper extremity Doppler negative for DVT ? ? ?Assessment and Plan: ?* Diffuse pain in left upper extremity ?Still has some pain.  Left upper extremity Doppler negative for DVT.  Carotid Dopplers showing no hemodynamically significant stenosis.  Vascular surgery recommends PT OT and pain control ? ? ? ?Hypertensive emergency ?Blood pressure (!) 141/120, pulse 73, temperature 97.8 ?F (36.6 ?C), temperature source Oral, resp. rate 14, height 5\' 6"  (1.676 m), weight 68.2 kg, SpO2 99 %. ?Initially required nicardipine drip but now weaned off. ?continue patient on Imdur, labetalol, Coreg, along with as needed labetalol ?Increase hydralazine to 100 mg p.o. 4 times daily for better blood pressure control ? ? ?HFrEF (heart failure with reduced ejection fraction) ([place]) ?Strict I's and O's. ?Getting dialyzed today ?Continue patient on Coreg, Imdur, labetalol. ?cardiology is following ? ?Chest pain ?None while here.  Monitor on telemetry-ejection fraction 40 to 45% on last cath ? ? ?Coronary artery disease involving native coronary artery of native heart without angina pectoris ?Cardiology following.  Continue Coreg and Imdur, aspirin.  He denies any anginal symptoms ? ? ?Type 2 diabetes mellitus with hyperlipidemia ([place]) ?Glycemic protocol per every 6 hours Accu-Chek.  diabetic nurse following. ?At hypoglycemic event reported last night.. CBG 52 and patient diaphoretic. Sprite given and 12.5g D50  ordered. q4H Novolog changed to sensitive scale. ? ?ESRD on hemodialysis ([place]) ?HD today ? ?History of DVT (deep vein thrombosis) ?Patient is on Eliquis for his history of DVT in December 2022 ?Eliquis restarted ? ? ? ? ?  ? ?Subjective: Complains of left lower extremity pain.  Daughter at bedside ? ?Physical Exam: ?Vitals:  ? 10/03/21 1645 10/03/21 1700 10/03/21 1715 10/03/21 1730  ?BP: (!) 165/87 (!) 171/98 (!) 170/87 (!) 141/120  ?Pulse: 73 72 71 73  ?Resp: 20 14 13 14   ?Temp:      ?TempSrc:      ?SpO2: 99% 100% 99% 99%  ?Weight:      ?Height:      ? ?61 year old male lying in the bed comfortably without any acute distress ?Lungs clear to auscultation bilaterally, no wheezing rales rhonchi or crepitation ?Cardiovascular S1-S2 normal, no murmur rubs or gallop ?Abdomen soft, benign ?Neuro alert and oriented, nonfocal ?Psych normal mood and affect ?HD access-right IJ tunneled dialysis catheter ? ?Data Reviewed: ? ?Was hypoglycemic earlier this morning but now sugar running high ? ?Family Communication: Daughter updated at bedside ? ?Disposition: ?Status is: Inpatient ?Remains inpatient appropriate because: Left upper extremity pain control and management of steal syndrome ? ? Planned Discharge Destination: Home ? ? ? DVT prophylaxis-Eliquis ?Time spent: 35 minutes ? ?Author: ?Max Sane, MD ?10/03/2021 5:58 PM ? ?For on call review [email or web address].  ?

## 2021-10-03 NOTE — Assessment & Plan Note (Addendum)
Blood pressure (!) 141/120, pulse 73, temperature 97.8 ?F (36.6 ?C), temperature source Oral, resp. rate 14, height 5\' 6"  (1.676 m), weight 68.2 kg, SpO2 99 %. ?Initially required nicardipine drip but now weaned off. ?continue patient on Imdur, labetalol, Coreg, along with as needed labetalol ?Increase hydralazine to 100 mg p.o. 4 times daily for better blood pressure control ? ?

## 2021-10-03 NOTE — TOC Initial Note (Signed)
Transition of Care (TOC) - Initial/Assessment Note  ? ? ?Patient Details  ?Name: Jimmy Olson ?MRN: [id number] ?Date of Birth: 30-Aug-1960 ? ?Transition of Care (TOC) CM/SW Contact:    ?Candie Chroman, LCSW ?Phone Number: ?10/03/2021, 2:33 PM ? ?Clinical Narrative:  Readmission prevention screen complete. [employer] [phone]). CSW met with patient, introduced role, and explained that discharge planning would be discussed. Patient lives home with his wife. She assists him as needed. PCP is [name]. No specific provider, just whoever is available. Wife drives him to appointments. He uses the [employer]. No issues obtaining medications. No home health or DME use prior to admission. MD is aware it may be difficulty to find home health due to only insurance being Medicaid. PT/OT evals pending. No further concerns. CSW encouraged patient to contact CSW as needed. CSW will continue to follow patient for support and facilitate return home once medically stable.               ? ?Expected Discharge Plan: Home/Self Care ?Barriers to Discharge: Continued Medical Work up ? ? ?Patient Goals and CMS Choice ?  ?  ?  ? ?Expected Discharge Plan and Services ?Expected Discharge Plan: Home/Self Care ?  ?  ?Post Acute Care Choice: NA ?Living arrangements for the past 2 months: [place] ?                ?  ?  ?  ?  ?  ?  ?  ?  ?  ?  ? ?Prior Living Arrangements/Services ?Living arrangements for the past 2 months: [place] ?Lives with:: Spouse ?Patient language and need for interpreter reviewed:: Yes (Spanish) ?Do you feel safe going back to the place where you live?: Yes      ?Need for Family Participation in Patient Care: Yes (Comment) ?Care giver support system in place?: Yes (comment) ?  ?Criminal Activity/Legal Involvement Pertinent to Current Situation/Hospitalization: No - Comment as needed ? ?Activities of Daily Living ?  ?  ? ?Permission Sought/Granted ?  ?  ?   ?   ?    ?   ? ?Emotional Assessment ?Appearance:: Appears stated age ?Attitude/Demeanor/Rapport: Engaged, Gracious ?Affect (typically observed): Accepting, Appropriate, Calm ?Orientation: : Oriented to Self, Oriented to Place, Oriented to  Time, Oriented to Situation ?Alcohol / Substance Use: Not Applicable ?Psych Involvement: No (comment) ? ?Admission diagnosis:  Hyperglycemia [R73.9] ?ESRD (end stage renal disease) ([place]) [N18.6] ?NSTEMI (non-ST elevated myocardial infarction) ([place]) [I21.4] ?Hypertensive emergency [I16.1] ?Diffuse pain in left upper extremity [M79.602] ?Pain of left upper extremity [M79.602] ?Patient Active Problem List  ? Diagnosis Date Noted  ? Chest pain 10/02/2021  ? Diffuse pain in left upper extremity 10/01/2021  ? Sepsis ([place]) 08/29/2021  ? Generalized weakness 08/12/2021  ? NSTEMI (non-ST elevated myocardial infarction) ([place]) 08/08/2021  ? History of DVT (deep vein thrombosis) 08/08/2021  ? Chronic anticoagulation 08/08/2021  ? Carotid stenosis 08/03/2021  ? ESRD on hemodialysis ([place]) 07/14/2021  ? TIA (transient ischemic attack) 07/14/2021  ? Acute deep vein thrombosis (DVT) of axillary vein (H[facility]) - left. diagnosed 05-2021 07/14/2021  ? History of anemia due to CKD 06/01/2021  ? Acute on chronic combined systolic and diastolic CHF (congestive heart failure) ([place]) 12/23/2020  ? Hyperlipidemia associated with type 2 diabetes mellitus ([place]) 12/23/2020  ? Coronary artery disease involving native coronary artery of native heart without angina pectoris   ? Demand ischemia ([place])   ? Severe  recurrent major depression without psychotic features ([place]) 07/16/2020  ? Uncontrolled type 2 diabetes mellitus with hyperglycemia, with long-term current use of insulin ([place]) 07/15/2020  ? Acute on chronic HFrEF (heart failure with reduced ejection fraction) ([place]) 07/15/2020  ? HLD (hyperlipidemia) 07/15/2020  ? COVID-19 virus infection 02/2020  ? Anemia of chronic kidney failure, stage 5 ([place]) 02/2020  ? HFrEF  (heart failure with reduced ejection fraction) ([place])   ? Hypertension associated with diabetes ([place])   ? Hypertensive emergency 09/11/2019  ? Type 2 diabetes mellitus with hyperlipidemia ([place]) 09/11/2019  ? ?PCP:  [facility] ?Pharmacy:   ?[facility] [place] [address] ?[address] ?[name] [address] ?Phone: [phone] Fax: [phone] ? ?[facility] ?[address] ?[name] [address] ?Phone: [phone] Fax: [phone] ? ?[facility] [place] ?[address] ?[name] [address] ?Phone: [phone] Fax: [phone] ? ? ? ? ?Social Determinants of Health (SDOH) Interventions ?  ? ?Readmission Risk Interventions ? ?  10/03/2021  ?  2:32 PM 08/08/2021  ?  8:49 AM 06/01/2021  ?  2:05 PM  ?Readmission Risk Prevention Plan  ?Transportation Screening Complete Complete Complete  ?PCP or Specialist Appt within 3-5 Days Complete Complete Complete  ?[place] or Home Care Consult  Complete Complete  ?Social Work Consult for [place] Planning/Counseling Complete Complete Complete  ?Palliative Care Screening Not Applicable Not Applicable Not Applicable  ?Medication Review Press photographer) Complete Complete Referral to Pharmacy  ? ? ? ?

## 2021-10-03 NOTE — Progress Notes (Signed)
? ?Progress Note ? ?Patient Name: Jimmy Olson ?Date of Encounter: 10/03/2021 ? ?[facility] Cardiologist: Ida Rogue, MD  ? ?Subjective  ? ?Patient overall is about the same. BP is OK sill elevated at times.  ? ?Inpatient Medications  ?  ?Scheduled Meds: ? carvedilol  25 mg Oral BID  ? Chlorhexidine Gluconate Cloth  6 each Topical Daily  ? epoetin (EPOGEN/PROCRIT) injection  4,000 Units Intravenous Q M,W,F-HD  ? ezetimibe  10 mg Oral Daily  ? heparin  5,000 Units Subcutaneous Q8H  ? hydrALAZINE  100 mg Oral Q8H  ? insulin aspart  0-9 Units Subcutaneous Q4H  ? insulin detemir  6 Units Subcutaneous Daily  ? isosorbide mononitrate  30 mg Oral Daily  ? rosuvastatin  40 mg Oral Daily  ? ?Continuous Infusions: ? ?PRN Meds: ?acetaminophen, albuterol, diphenhydrAMINE, HYDROcodone-acetaminophen, labetalol, morphine injection, nitroGLYCERIN, nitroGLYCERIN, nitroGLYCERIN, ondansetron (ZOFRAN) IV  ? ?Vital Signs  ?  ?Vitals:  ? 10/03/21 0500 10/03/21 0537 10/03/21 0600 10/03/21 0800  ?BP:  128/65  (!) 143/75  ?Pulse: 71  68 77  ?Resp: 20  12 14   ?Temp:    97.8 ?F (36.6 ?C)  ?TempSrc:    Oral  ?SpO2: 96%  96% 98%  ?Height:      ? ? ?Intake/Output Summary (Last 24 hours) at 10/03/2021 1206 ?Last data filed at 10/03/2021 0900 ?Gross per 24 hour  ?Intake 1035 ml  ?Output 300 ml  ?Net 735 ml  ? ? ?  09/27/2021  ?  3:22 PM 09/16/2021  ?  2:58 PM 09/08/2021  ?  8:45 AM  ?Last 3 Weights  ?Weight (lbs) 150 lb 4 oz 148 lb 146 lb 6.4 oz  ?Weight (kg) 68.153 kg 67.132 kg 66.407 kg  ?   ? ?Telemetry  ?  ?NSR HR 70s - Personally Reviewed ? ?ECG  ?  ?No new - Personally Reviewed ? ?Physical Exam  ? ?GEN: No acute distress.   ?Neck: No JVD ?Cardiac: RRR, no murmurs, rubs, or gallops.  ?Respiratory: Clear to auscultation bilaterally. ?GI: Soft, nontender, non-distended  ?MS: No edema; No deformity. ?Neuro:  Nonfocal  ?Psych: Normal affect  ? ?Labs  ?  ?High Sensitivity Troponin:   ?Recent Labs  ?Lab 10/01/21 ?1937 10/02/21 ?0002  10/02/21 ?0203  ?TROPONINIHS 210* 199* 224*  ?   ?Chemistry ?Recent Labs  ?Lab 09/29/21 ?1216 10/01/21 ?1937 10/03/21 ?0506  ?NA 139 133* 135  ?K 4.4 4.4 4.3  ?CL 102 96* 99  ?CO2  --  28 25  ?GLUCOSE 240* 515* 241*  ?BUN 32* 45* 58*  ?CREATININE 3.90* 4.06* 5.37*  ?CALCIUM  --  9.0 8.8*  ?GFRNONAA  --  16* 12*  ?ANIONGAP  --  9 11  ?  ?Lipids  ?Recent Labs  ?Lab 10/02/21 ?0203  ?CHOL 113  ?TRIG 170*  ?HDL 34*  ?[place] 45  ?CHOLHDL 3.3  ?  ?Hematology ?Recent Labs  ?Lab 09/29/21 ?1216 10/01/21 ?1937 10/03/21 ?0506  ?WBC  --  5.4 6.7  ?RBC  --  3.58* 3.82*  ?HGB 10.9* 10.2* 11.1*  ?HCT 32.0* 31.6* 33.8*  ?MCV  --  88.3 88.5  ?MCH  --  28.5 29.1  ?MCHC  --  32.3 32.8  ?RDW  --  14.4 14.6  ?PLT  --  176 176  ? ?Thyroid No results for input(s): TSH, FREET4 in the last 168 hours.  ?BNPNo results for input(s): BNP, PROBNP in the last 168 hours.  ?DDimer No results for input(s):  DDIMER in the last 168 hours.  ? ?Radiology  ?  ?CT HEAD W & WO CONTRAST (5MM) ? ?Result Date: 10/03/2021 ?CLINICAL DATA:  Transient ischemic attack (TIA) EXAM: CT HEAD WITHOUT AND WITH CONTRAST TECHNIQUE: Contiguous axial images were obtained from the base of the skull through the vertex without and with intravenous contrast. RADIATION DOSE REDUCTION: This exam was performed according to the departmental dose-optimization program which includes automated exposure control, adjustment of the mA and/or kV according to patient size and/or use of iterative reconstruction technique. CONTRAST:  84mL OMNIPAQUE IOHEXOL 300 MG/ML  SOLN COMPARISON:  CT head 09/06/2021 FINDINGS: Brain: There is no acute intracranial hemorrhage, mass, mass effect, or edema. No abnormal enhancement. Gray-white differentiation is preserved. There is no extra-axial fluid collection. Ventricles and sulci are within normal limits in size and configuration. Vascular: There is atherosclerotic calcification at the skull base. Skull: Calvarium is unremarkable. Sinuses/Orbits: Paranasal  sinus mucosal thickening. Orbits are unremarkable. Other: None. IMPRESSION: No acute intracranial hemorrhage or evidence of acute infarction. No mass or enhancement. Electronically Signed   By: Macy Mis M.D.   On: 10/03/2021 07:46  ? ?CT Angio Chest Pulmonary Embolism (PE) W or WO Contrast ? ?Result Date: 10/02/2021 ?CLINICAL DATA:  Pulmonary embolism (PE) suspected, high prob. Left arm pain EXAM: CT ANGIOGRAPHY CHEST WITH CONTRAST TECHNIQUE: Multidetector CT imaging of the chest was performed using the standard protocol during bolus administration of intravenous contrast. Multiplanar CT image reconstructions and MIPs were obtained to evaluate the vascular anatomy. RADIATION DOSE REDUCTION: This exam was performed according to the departmental dose-optimization program which includes automated exposure control, adjustment of the mA and/or kV according to patient size and/or use of iterative reconstruction technique. CONTRAST:  7mL OMNIPAQUE IOHEXOL 350 MG/ML SOLN COMPARISON:  None. FINDINGS: Cardiovascular: There is adequate opacification of the pulmonary arterial tree. No intraluminal filling defect identified to suggest acute pulmonary embolism. The central pulmonary arteries are of normal caliber. Cardiac size is mildly enlarged with left ventricular hypertrophy noted. Right internal jugular hemodialysis catheter tip noted within the right atrium. Extensive multi-vessel coronary artery calcification. No pericardial effusion. Minimal atherosclerotic calcification within the thoracic aorta. No aortic aneurysm. Bovine arch anatomy with wide patency of the arch vasculature proximally. Mediastinum/Nodes: No enlarged mediastinal, hilar, or axillary lymph nodes. Thyroid gland, trachea, and esophagus demonstrate no significant findings. Lungs/Pleura: Mild bibasilar atelectasis. The lungs are otherwise clear. No pneumothorax or pleural effusion. No central obstructing lesion. Upper Abdomen: No acute abnormality.  Musculoskeletal: No chest wall abnormality. No acute or significant osseous findings. Review of the MIP images confirms the above findings. IMPRESSION: No pulmonary embolism. Extensive multi-vessel coronary artery calcification. Mild cardiomegaly with left ventricular hypertrophy. Aortic Atherosclerosis (ICD10-I70.0). Electronically Signed   By: Fidela Salisbury M.D.   On: 10/02/2021 00:59  ? ?US Carotid Bilateral ? ?Result Date: 10/03/2021 ?CLINICAL DATA:  Left arm weakness Left upper extremity ischemia EXAM: BILATERAL CAROTID DUPLEX ULTRASOUND TECHNIQUE: Pearline Cables scale imaging, color Doppler and duplex ultrasound were performed of bilateral carotid and vertebral arteries in the neck. COMPARISON:  None. FINDINGS: Criteria: Quantification of carotid stenosis is based on velocity parameters that correlate the residual internal carotid diameter with NASCET-based stenosis levels, using the diameter of the distal internal carotid lumen as the denominator for stenosis measurement. The following velocity measurements were obtained: RIGHT ICA: 59/19 cm/sec CCA: 78/29 cm/sec SYSTOLIC ICA/CCA RATIO:  0.9 ECA: 94 cm/sec LEFT ICA: 245/64 cm/sec CCA: 56/21 cm/sec SYSTOLIC ICA/CCA RATIO:  3.3 ECA: 123 cm/sec RIGHT CAROTID  ARTERY: No significant atheromatous plaque. RIGHT VERTEBRAL ARTERY:  Antegrade flow. LEFT CAROTID ARTERY: Mild atheromatous plaque of the carotid bifurcation. LEFT VERTEBRAL ARTERY:  Antegrade flow. IMPRESSION: 1. No significant stenosis of the right internal carotid artery. 2. 50-69% stenosis of the left internal carotid artery. The amount of plaque burden is less than expected for such high velocities. Velocities may be artifact. Consider further evaluation with CT angiography of the neck to better quantify degree of stenosis of the left internal carotid artery. Electronically Signed   By: Miachel Roux M.D.   On: 10/03/2021 10:51  ? ?DG Chest Portable 1 View ? ?Result Date: 10/01/2021 ?CLINICAL DATA:  Chest pain  EXAM: PORTABLE CHEST 1 VIEW COMPARISON:  09/06/2021 FINDINGS: Right-sided central venous catheter tip over the cavoatrial region. No focal opacity, pleural effusion or pneumothorax. Normal cardiac size. IMPRESSION: No

## 2021-10-03 NOTE — Assessment & Plan Note (Signed)
Still has some pain.  Left upper extremity Doppler negative for DVT.  Carotid Dopplers showing no hemodynamically significant stenosis.  Vascular surgery recommends PT OT and pain control ? ? ?

## 2021-10-03 NOTE — Assessment & Plan Note (Addendum)
Glycemic protocol per every 6 hours Accu-Chek.  diabetic nurse following. ?At hypoglycemic event reported last night.. CBG 52 and patient diaphoretic. Sprite given and 12.5g D50 ordered. q4H Novolog changed to sensitive scale. ?

## 2021-10-03 NOTE — Assessment & Plan Note (Signed)
Cardiology following.  Continue Coreg and Imdur, aspirin.  He denies any anginal symptoms ? ?

## 2021-10-03 NOTE — Progress Notes (Signed)
Hemodialysis Post Treatment Note ?October 03, 2021 ?Access: RIJ Catheter ?Scheduled Treatment Hours: 3 hrs. ?UF Removed: 1-liter ?Next Scheduled Treatment: 10/05/21 ?Note: ?Patient completes hemodialysis treatment without incident. Tunneled catheter intact, no signs of infection. Targeted UF met with 1-liter fluid removed, blood flow rate maintained throughout treatment. Patient express discomfort to arm, indicates post procedural expectation. Patient transferred to general surgery unit, report given to nurse.  ?

## 2021-10-03 NOTE — Anesthesia Postprocedure Evaluation (Signed)
Anesthesia Post Note ? ?Patient: Jimmy Olson ? ?Procedure(s) Performed: LIGATION OF ARTERIOVENOUS  FISTULA (Left: Arm Lower) ? ?Patient location during evaluation: SICU ?Anesthesia Type: Regional ?Level of consciousness: awake and alert ?Pain management: pain level controlled ?Vital Signs Assessment: post-procedure vital signs reviewed and stable ?Respiratory status: spontaneous breathing, respiratory function stable and nonlabored ventilation ?Cardiovascular status: stable ?Postop Assessment: no apparent nausea or vomiting ?Anesthetic complications: no ? ? ?No notable events documented. ? ? ?Last Vitals:  ?Vitals:  ? 10/03/21 0600 10/03/21 0800  ?BP:  (!) 143/75  ?Pulse: 68 77  ?Resp: 12 14  ?Temp:  36.6 ?C  ?SpO2: 96% 98%  ?  ?Last Pain:  ?Vitals:  ? 10/03/21 0800  ?TempSrc: Oral  ?PainSc: 7   ? ? ?  ?  ?  ?  ?  ?  ? ?Mia Milan B [place] ? ? ? ? ?

## 2021-10-03 NOTE — Progress Notes (Signed)
Inpatient Diabetes Program Recommendations ? ?AACE/ADA: New Consensus Statement on Inpatient Glycemic Control ? ?Target Ranges:  Prepandial:   less than 140 mg/dL ?     Peak postprandial:   less than 180 mg/dL (1-2 hours) ?     Critically ill patients:  140 - 180 mg/dL  ? ? Latest Reference Range & Units 10/02/21 23:27 10/03/21 03:29 10/03/21 04:17 10/03/21 06:19 10/03/21 07:16  ?Glucose-Capillary 70 - 99 mg/dL 159 (H) 52 (L) 168 (H) 258 (H) 259 (H)  ? ? Latest Reference Range & Units 10/02/21 08:56 10/02/21 11:33 10/02/21 15:41 10/02/21 19:22  ?Glucose-Capillary 70 - 99 mg/dL 159 (H) 176 (H) 82 268 (H)  ? ?Review of Glycemic Control ? ?Diabetes history: DM2 ?Outpatient Diabetes medications: Levemir 25 units QHS, Humalog TID per sliding scale ?Current orders for Inpatient glycemic control: Levemir 6 units daily, Novlog 0-9 units Q4H ? ?Inpatient Diabetes Program Recommendations:   ? ?Insulin: Noted hypoglycemia at 3:29 am today and that Novolog correction scale was decreased to sensitive scale. Agree with current orders. ? ?Thanks, ?Barnie Alderman, RN, MSN, CDE ?Diabetes Coordinator ?Inpatient Diabetes Program ?[phone] (Team Pager from 8am to 5pm) ? ? ? ? ?

## 2021-10-03 NOTE — Progress Notes (Signed)
?   10/03/21 1100  ?Clinical Encounter Type  ?Visited With Patient and family together  ?Visit Type Initial  ? ?Chaplain had introductory conversation with patient and family members to make aware of Chaplain services and to provide support. ?

## 2021-10-03 NOTE — Progress Notes (Signed)
PT Cancellation Note ? ?Patient Details ?Name: Jimmy Olson ?MRN: [id number] ?DOB: Jul 07, 1960 ? ? ?Cancelled Treatment:    Reason Eval/Treat Not Completed: Medical issues which prohibited therapy (Consult received and chart reveiwed.  Patient noted with doppler to L UE pending to rule out DVT.  Per guidelines, will hold therapy evaluations until test complete/results received.  Will continue to follow and initiate as appropriate.) ? ? ?[name] H. Owens Shark, PT, DPT, NCS ?10/03/21, 1:45 PM ?[phone] ? ?

## 2021-10-03 NOTE — Progress Notes (Addendum)
0700 Back from CT. ?0840 DOWN FOR DOPPLER STUDIES AND ULTRASOUND. ?0930 BACK TO ROOM. ?

## 2021-10-03 NOTE — Assessment & Plan Note (Signed)
HD today

## 2021-10-03 NOTE — Progress Notes (Signed)
? ?      CROSS COVER NOTE ? ?NAME: KIYAAN HAQ ?MRN: [id number] ?DOB : 1960-12-13 ? ? ?Hypoglycemic event reported by nursing. CBG 52 and patient diaphoretic. Sprite given and 12.5g D50 ordered. q4H Novolog changed to sensitive scale.  ? ? ?Neomia Glass MHA, MSN, FNP-BC ?Nurse Practitioner ?[facility] ?[place] ?Pager [phone] ? ?

## 2021-10-04 ENCOUNTER — Inpatient Hospital Stay: Payer: Medicare Other

## 2021-10-04 DIAGNOSIS — M79602 Pain in left arm: Secondary | ICD-10-CM | POA: Diagnosis not present

## 2021-10-04 DIAGNOSIS — R29898 Other symptoms and signs involving the musculoskeletal system: Secondary | ICD-10-CM | POA: Diagnosis not present

## 2021-10-04 DIAGNOSIS — I248 Other forms of acute ischemic heart disease: Secondary | ICD-10-CM | POA: Diagnosis not present

## 2021-10-04 DIAGNOSIS — I639 Cerebral infarction, unspecified: Secondary | ICD-10-CM

## 2021-10-04 DIAGNOSIS — I5020 Unspecified systolic (congestive) heart failure: Secondary | ICD-10-CM | POA: Diagnosis not present

## 2021-10-04 DIAGNOSIS — T82898A Other specified complication of vascular prosthetic devices, implants and grafts, initial encounter: Secondary | ICD-10-CM | POA: Diagnosis present

## 2021-10-04 DIAGNOSIS — I161 Hypertensive emergency: Secondary | ICD-10-CM | POA: Diagnosis not present

## 2021-10-04 LAB — GLUCOSE, CAPILLARY
Glucose-Capillary: 169 mg/dL — ABNORMAL HIGH (ref 70–99)
Glucose-Capillary: 160 mg/dL — ABNORMAL HIGH (ref 70–99)
Glucose-Capillary: 248 mg/dL — ABNORMAL HIGH (ref 70–99)
Glucose-Capillary: 254 mg/dL — ABNORMAL HIGH (ref 70–99)
Glucose-Capillary: 105 mg/dL — ABNORMAL HIGH (ref 70–99)

## 2021-10-04 MED ORDER — SENNOSIDES-DOCUSATE SODIUM 8.6-50 MG PO TABS
2.0000 | ORAL_TABLET | Freq: Two times a day (BID) | ORAL | Status: DC
  Administered 2021-10-04 – 2021-10-06 (×4): 2 via ORAL
  Filled 2021-10-04 (×7): qty 2

## 2021-10-04 MED ORDER — LORAZEPAM 2 MG/ML IJ SOLN
0.5000 mg | Freq: Once | INTRAMUSCULAR | Status: AC | PRN
  Administered 2021-10-04: 0.5 mg via INTRAVENOUS
  Filled 2021-10-04: qty 1

## 2021-10-04 MED ORDER — INSULIN DETEMIR 100 UNIT/ML ~~LOC~~ SOLN
8.0000 [IU] | Freq: Every day | SUBCUTANEOUS | Status: DC
  Administered 2021-10-05: 8 [IU] via SUBCUTANEOUS
  Filled 2021-10-04 (×2): qty 0.08

## 2021-10-04 MED ORDER — INSULIN ASPART 100 UNIT/ML IJ SOLN
2.0000 [IU] | Freq: Three times a day (TID) | INTRAMUSCULAR | Status: DC
Start: 2021-10-05 — End: 2021-10-06
  Administered 2021-10-05 (×3): 2 [IU] via SUBCUTANEOUS
  Filled 2021-10-04 (×3): qty 1

## 2021-10-04 MED ORDER — POLYETHYLENE GLYCOL 3350 17 G PO PACK
17.0000 g | PACK | Freq: Every day | ORAL | Status: DC
Start: 2021-10-04 — End: 2021-10-07
  Administered 2021-10-04 – 2021-10-06 (×3): 17 g via ORAL
  Filled 2021-10-04 (×3): qty 1

## 2021-10-04 MED ORDER — LOSARTAN POTASSIUM 25 MG PO TABS
25.0000 mg | ORAL_TABLET | Freq: Every day | ORAL | Status: DC
  Administered 2021-10-04: 25 mg via ORAL
  Filled 2021-10-04: qty 1

## 2021-10-04 MED ORDER — ROSUVASTATIN CALCIUM 10 MG PO TABS
10.0000 mg | ORAL_TABLET | Freq: Every day | ORAL | Status: DC
  Administered 2021-10-05 – 2021-10-08 (×4): 10 mg via ORAL
  Filled 2021-10-04 (×4): qty 1

## 2021-10-04 NOTE — Assessment & Plan Note (Signed)
Glycemic protocol per every 6 hours Accu-Chek.  diabetic nurse following. ?Increase Levemir to 8 units subcu daily and add NovoLog 2 units subcu with each meals.  Continue sliding scale ?

## 2021-10-04 NOTE — Progress Notes (Signed)
? ?Progress Note ? ?Patient Name: Jimmy Olson ?Date of Encounter: 10/04/2021 ? ?[facility] Cardiologist: Ida Rogue, MD  ? ?Subjective  ? ?Patient reports upper arm and neck pain. BP still elevated.  ? ?Inpatient Medications  ?  ?Scheduled Meds: ? apixaban  5 mg Oral BID  ? carvedilol  25 mg Oral BID  ? Chlorhexidine Gluconate Cloth  6 each Topical Daily  ? epoetin (EPOGEN/PROCRIT) injection  4,000 Units Intravenous Q M,W,F-HD  ? ezetimibe  10 mg Oral Daily  ? hydrALAZINE  100 mg Oral Q8H  ? insulin aspart  0-9 Units Subcutaneous Q4H  ? insulin detemir  6 Units Subcutaneous Daily  ? isosorbide mononitrate  30 mg Oral Daily  ? losartan  25 mg Oral QHS  ? [START ON 10/05/2021] rosuvastatin  10 mg Oral Daily  ? ?Continuous Infusions: ? ?PRN Meds: ?acetaminophen, albuterol, diphenhydrAMINE, HYDROcodone-acetaminophen, labetalol, morphine injection, nitroGLYCERIN, nitroGLYCERIN, ondansetron (ZOFRAN) IV  ? ?Vital Signs  ?  ?Vitals:  ? 10/03/21 1900 10/03/21 1936 10/04/21 0359 10/04/21 0817  ?BP: (!) 199/105 (!) 196/88 (!) 168/84 (!) 185/87  ?Pulse: 72 76 77 81  ?Resp: 13 18 20 18   ?Temp: 98.4 ?F (36.9 ?C) 98 ?F (36.7 ?C) 98.4 ?F (36.9 ?C) 98 ?F (36.7 ?C)  ?TempSrc: Oral   Oral  ?SpO2: 99% 99% 97% 98%  ?Weight: 65.9 kg     ?Height:      ? ? ?Intake/Output Summary (Last 24 hours) at 10/04/2021 1214 ?Last data filed at 10/04/2021 1024 ?Gross per 24 hour  ?Intake 220 ml  ?Output 926 ml  ?Net -706 ml  ? ? ?  10/03/2021  ?  7:00 PM 10/03/2021  ?  1:32 PM 09/27/2021  ?  3:22 PM  ?Last 3 Weights  ?Weight (lbs) 145 lb 4.5 oz 150 lb 4 oz 150 lb 4 oz  ?Weight (kg) 65.9 kg 68.153 kg 68.153 kg  ?   ? ?Telemetry  ?  ?N/A - Personally Reviewed ? ?ECG  ?  ?No new - Personally Reviewed ? ?Physical Exam  ? ?GEN: No acute distress.   ?Neck: No JVD ?Cardiac: RRR, no murmurs, rubs, or gallops.  ?Respiratory: Clear to auscultation bilaterally. ?GI: Soft, nontender, non-distended  ?MS: No edema; No deformity. ?Neuro:  Nonfocal  ?Psych:  Normal affect  ? ?Labs  ?  ?High Sensitivity Troponin:   ?Recent Labs  ?Lab 10/01/21 ?1937 10/02/21 ?0002 10/02/21 ?0203  ?TROPONINIHS 210* 199* 224*  ?   ?Chemistry ?Recent Labs  ?Lab 09/29/21 ?1216 10/01/21 ?1937 10/03/21 ?0506  ?NA 139 133* 135  ?K 4.4 4.4 4.3  ?CL 102 96* 99  ?CO2  --  28 25  ?GLUCOSE 240* 515* 241*  ?BUN 32* 45* 58*  ?CREATININE 3.90* 4.06* 5.37*  ?CALCIUM  --  9.0 8.8*  ?GFRNONAA  --  16* 12*  ?ANIONGAP  --  9 11  ?  ?Lipids  ?Recent Labs  ?Lab 10/02/21 ?0203  ?CHOL 113  ?TRIG 170*  ?HDL 34*  ?[place] 45  ?CHOLHDL 3.3  ?  ?Hematology ?Recent Labs  ?Lab 09/29/21 ?1216 10/01/21 ?1937 10/03/21 ?0506  ?WBC  --  5.4 6.7  ?RBC  --  3.58* 3.82*  ?HGB 10.9* 10.2* 11.1*  ?HCT 32.0* 31.6* 33.8*  ?MCV  --  88.3 88.5  ?MCH  --  28.5 29.1  ?MCHC  --  32.3 32.8  ?RDW  --  14.4 14.6  ?PLT  --  176 176  ? ?Thyroid No results for  input(s): TSH, FREET4 in the last 168 hours.  ?BNPNo results for input(s): BNP, PROBNP in the last 168 hours.  ?DDimer No results for input(s): DDIMER in the last 168 hours.  ? ?Radiology  ?  ?CT HEAD W & WO CONTRAST (5MM) ? ?Result Date: 10/03/2021 ?CLINICAL DATA:  Transient ischemic attack (TIA) EXAM: CT HEAD WITHOUT AND WITH CONTRAST TECHNIQUE: Contiguous axial images were obtained from the base of the skull through the vertex without and with intravenous contrast. RADIATION DOSE REDUCTION: This exam was performed according to the departmental dose-optimization program which includes automated exposure control, adjustment of the mA and/or kV according to patient size and/or use of iterative reconstruction technique. CONTRAST:  62mL OMNIPAQUE IOHEXOL 300 MG/ML  SOLN COMPARISON:  CT head 09/06/2021 FINDINGS: Brain: There is no acute intracranial hemorrhage, mass, mass effect, or edema. No abnormal enhancement. Gray-white differentiation is preserved. There is no extra-axial fluid collection. Ventricles and sulci are within normal limits in size and configuration. Vascular: There is  atherosclerotic calcification at the skull base. Skull: Calvarium is unremarkable. Sinuses/Orbits: Paranasal sinus mucosal thickening. Orbits are unremarkable. Other: None. IMPRESSION: No acute intracranial hemorrhage or evidence of acute infarction. No mass or enhancement. Electronically Signed   By: Macy Mis M.D.   On: 10/03/2021 07:46  ? ?US Carotid Bilateral ? ?Result Date: 10/03/2021 ?CLINICAL DATA:  Left arm weakness Left upper extremity ischemia EXAM: BILATERAL CAROTID DUPLEX ULTRASOUND TECHNIQUE: Pearline Cables scale imaging, color Doppler and duplex ultrasound were performed of bilateral carotid and vertebral arteries in the neck. COMPARISON:  None. FINDINGS: Criteria: Quantification of carotid stenosis is based on velocity parameters that correlate the residual internal carotid diameter with NASCET-based stenosis levels, using the diameter of the distal internal carotid lumen as the denominator for stenosis measurement. The following velocity measurements were obtained: RIGHT ICA: 59/19 cm/sec CCA: 75/64 cm/sec SYSTOLIC ICA/CCA RATIO:  0.9 ECA: 94 cm/sec LEFT ICA: 245/64 cm/sec CCA: 33/29 cm/sec SYSTOLIC ICA/CCA RATIO:  3.3 ECA: 123 cm/sec RIGHT CAROTID ARTERY: No significant atheromatous plaque. RIGHT VERTEBRAL ARTERY:  Antegrade flow. LEFT CAROTID ARTERY: Mild atheromatous plaque of the carotid bifurcation. LEFT VERTEBRAL ARTERY:  Antegrade flow. IMPRESSION: 1. No significant stenosis of the right internal carotid artery. 2. 50-69% stenosis of the left internal carotid artery. The amount of plaque burden is less than expected for such high velocities. Velocities may be artifact. Consider further evaluation with CT angiography of the neck to better quantify degree of stenosis of the left internal carotid artery. Electronically Signed   By: Miachel Roux M.D.   On: 10/03/2021 10:51  ? ?US Venous Img Upper Uni Left (DVT) ? ?Result Date: 10/03/2021 ?CLINICAL DATA:  Left upper extremity pain for 2 days Ischemia of  left upper extremity. Left radiocephalic AV fistula ligation on 04/16. EXAM: LEFT UPPER EXTREMITY VENOUS DOPPLER ULTRASOUND TECHNIQUE: Gray-scale sonography with graded compression, as well as color Doppler and duplex ultrasound were performed to evaluate the upper extremity deep venous system from the level of the subclavian vein and including the jugular, axillary, basilic, radial, ulnar and upper cephalic vein. Spectral Doppler was utilized to evaluate flow at rest and with distal augmentation maneuvers. COMPARISON:  None. FINDINGS: Contralateral Subclavian Vein: Respiratory phasicity is normal and symmetric with the symptomatic side. No evidence of thrombus. Normal compressibility. Internal Jugular Vein: No evidence of thrombus. Normal compressibility, respiratory phasicity and response to augmentation. Subclavian Vein: No evidence of thrombus. Normal compressibility, respiratory phasicity and response to augmentation. Axillary Vein: No evidence of thrombus. Normal  compressibility, respiratory phasicity and response to augmentation. Cephalic Vein: No evidence of thrombus. Normal compressibility, respiratory phasicity and response to augmentation. Basilic Vein: No evidence of thrombus. Normal compressibility, respiratory phasicity and response to augmentation. Brachial Veins: No evidence of thrombus. Normal compressibility, respiratory phasicity and response to augmentation. Radial Veins: No evidence of thrombus. Normal compressibility, respiratory phasicity and response to augmentation. Ulnar Veins: No evidence of thrombus. Normal compressibility, respiratory phasicity and response to augmentation. Venous Reflux:  None visualized. Other Findings:  None visualized. IMPRESSION: No evidence of DVT within the left upper extremity. Electronically Signed   By: Miachel Roux M.D.   On: 10/03/2021 10:54  ? ?DG Shoulder Left ? ?Result Date: 10/04/2021 ?CLINICAL DATA:  Left shoulder pain EXAM: LEFT SHOULDER - 2+ VIEW  COMPARISON:  None. FINDINGS: No fracture or dislocation is seen. Degenerative changes are noted with bony spurs in the inferior aspect of shoulder joint and in the left AC joint. No abnormal soft tissue calcificati

## 2021-10-04 NOTE — Consult Note (Signed)
?                    NEURO HOSPITALIST CONSULT NOTE  ? ?Requestig physician: Dr. Manuella Ghazi ? ?Reason for Consult: LUE weakness ? ?History obtained from:  Patient and Chart    ? ?HPI:                                                                                                                                         ? Jimmy Olson is an 61 y.o. male with an extensive PMHx as documented below, including ESRD on HD, DM, CAD, HFrEF, left axillary DVT, HLD, HTN, left carotid artery stenosis and TIA who was admitted to the hospital on 4/15 with LUE pain, swelling and numbness x 1 day. He states that the pain and swelling are due to newly placed A-V fistula in the left arm. Pain was described as constant, 7/10/ and worse with movement; the pain is associated with a tight feeling as though his nerves are pulling, in addition to a sensation of numbness. He was hypertensive and was started on a nicardipine drip. He also was complaining of chest pain. CT chest PE protocol  ? ?He is on Eliquis for management of a DVT. He had been hospitalized in January for left arm numbness and dizziness along with inability to stand without assistance. CVA work-up was negative but TIA was suspected. The patient was continued on Eliquis at that time. ? ?Of note, he had been off of his Eliquis for 2 days prior to admission in anticipation of a procedure for his LUE AV fistula.  ? ?Arterial U/S of LUE showed no evidence of focal arterial occlusion or stenosis, but there was slightly lower peak systolic velocity in the distal radial artery at the wrist. LUE venous ultrasound showed no evidence of DVT within the left upper extremity. CTA chest was negative for PE.  ?  ?CT head this admission showed no acute intracranial hemorrhage or evidence of acute infarction. ? ?Carotid ultrasound showed no significant stenosis of the right internal carotid artery, 50-69% stenosis of the left internal carotid artery with a . plaque burden less than expected for  the high velocities.  ? ?Of note, MRI in January revealed small remote cortical infarcts in the left occipital lobe and left precentral gyrus. ? ? ?Past Medical History:  ?Diagnosis Date  ? Anemia   ? Anxiety and depression   ? ATN (acute tubular necrosis) (HCC)   ? CAD (coronary artery disease)   ? a.) R/LHC 07/19/2020: 20% pRCA, 30% mRCA, 50% dRCA, 50% RPAV, 40% pLAD, 70% D1, 60% mLAD; LVEDP 19 mmHg; PA 37/16 (26 mmHg); PCWP 13 mmHg. b.) LHC 06/03/2021: EF 45-50%; LVEDP 18 mmHg; 40% pLAD, 60% mLAD, 20% pRCA, 30% mRCA, 70% D1, 30% dRCA, 40% RPDA.  ? Chronic HFrEF (heart failure with reduced ejection fraction) ([place])   ? a.)  06/2020 Echo: EF 30-35%, glob HK, GrII DD, nl RV fxn. Mild LAE. b.)  TTE 08/08/2021: EF 40-45%; global HK; mild LVH; GLS -8.6; mild LA dilation.  ? DVT of axillary vein, acute left ([place]) 05/2021  ? a.) Tx'd with apixaban  ? ESRD (end stage renal disease) on dialysis [facility])   ? a.) M-W-F  ? History of 2019 novel coronavirus disease (COVID-19) 03/06/2020  ? a.) s/p Tx with monoclonal Ab infusion  ? HLD (hyperlipidemia)   ? Hyperkalemia 12/23/2020  ? Hypertension   ? Left carotid artery stenosis 08/18/2021  ? a.) Carotid Doppler 02/54/2706: 23-76% LICA stenosis  ? Long term current use of anticoagulant   ? a.) apixaban for DVT  ? NICM (nonischemic cardiomyopathy) ([place])   ? a.) 07/2009 MV: EF 48%; b.) 08/2019 Echo: EF 45-50%. Global HK. Mod LVH. GrIDD; c.) 06/2020 Echo: EF 30-35%, glob HK, mild LVH, GrII DD, nl RV fxn, mild LAE; d.) 06/2020 Cath: nonobs dzs. e.)  LHC 06/03/2021; EF 45-50%; LVEDP 18 mmHg; nonobstructive CAD. f.) b.)  TTE 08/08/2021: EF 40-45%; global HK; mild LVH; GLS -8.6; mild LA dilation.  ? NSTEMI (non-ST elevated myocardial infarction) ([place]) 07/15/2020  ? a.) hsTnI trended: 124--> 127--> 176 --> 190 --> 200 ng/L.  ? Sepsis ([place])   ? Suicidal ideations   ? TIA (transient ischemic attack) 06/2021  ? Type 2 diabetes mellitus treated with insulin ([place])   ? ? ?Past Surgical History:   ?Procedure Laterality Date  ? APPENDECTOMY    ? AV FISTULA PLACEMENT Left 09/29/2021  ? Procedure: ARTERIOVENOUS (AV) FISTULA CREATION ( RADIAL CEPHALIC);  Surgeon: Algernon Huxley, MD;  Location: [facility] ORS;  Service: Vascular;  Laterality: Left;  ? CARDIAC CATHETERIZATION    ? DIALYSIS/PERMA CATHETER INSERTION N/A 06/02/2021  ? Procedure: DIALYSIS/PERMA CATHETER INSERTION;  Surgeon: Algernon Huxley, MD;  Location: [facility];  Service: Cardiovascular;  Laterality: N/A;  ? LEFT HEART CATH AND CORONARY ANGIOGRAPHY N/A 06/03/2021  ? Procedure: LEFT HEART CATH AND CORONARY ANGIOGRAPHY;  Surgeon: Wellington Hampshire, MD;  Location: [facility];  Service: Cardiovascular;  Laterality: N/A;  ? LIGATION OF ARTERIOVENOUS  FISTULA Left 10/02/2021  ? Procedure: LIGATION OF ARTERIOVENOUS  FISTULA;  Surgeon: Evaristo Bury, MD;  Location: [facility] OR[facility];  Service: Vascular;  Laterality: Left;  ? NASAL SINUS SURGERY    ? RIGHT/LEFT HEART CATH AND CORONARY ANGIOGRAPHY N/A 07/19/2020  ? Procedure: RIGHT/LEFT HEART CATH AND CORONARY ANGIOGRAPHY;  Surgeon: Wellington Hampshire, MD;  Location: [facility];  Service: Cardiovascular;  Laterality: N/A;  ? ? ?Family History  ?Problem Relation Age of Onset  ? Kidney failure Mother   ?     died @ 69  ? Heart failure Mother   ? Other Father   ?     he never knew his father  ? Diabetes Brother   ?          ? ?Social History:  reports that he has never smoked. He has never used smokeless tobacco. He reports that he does not currently use alcohol. He reports that he does not use drugs. ? ?No Known Allergies ? ?MEDICATIONS:                                                                                                                     ?  Prior to Admission:  ?Medications Prior to Admission  ?Medication Sig Dispense Refill Last Dose  ? albuterol (PROVENTIL HFA) 108 (90 Base) MCG/ACT inhaler INHALE 2 PUFFS INTO THE LUNGS ONCE EVERY 6 HOURS AS NEEDED FOR WHEEZING OR SHORTNESS OF BREATH.  6.7 g 10 prn at prn  ? apixaban (ELIQUIS) 5 MG TABS tablet TAKE 1 TABLET BY MOUTH TWICE DAILY. (Patient taking differently: Take 5 mg by mouth daily.) 180 tablet 1 10/01/2021 at 0830  ? carvedilol (COREG) 25 MG tablet Take 1 tablet (25 mg total) by mouth 2 (two) times daily. 60 tablet 6 10/01/2021 at 0830  ? ezetimibe (ZETIA) 10 MG tablet Take 10 mg by mouth daily.   10/01/2021 at 0830  ? HUMALOG KWIKPEN 100 UNIT/ML KwikPen Inject into the skin 3 (three) times daily. Sliding scale   prn at prn  ? hydrALAZINE (APRESOLINE) 100 MG tablet Take 1 tablet (100 mg total) by mouth once every 8 (eight) hours. (Patient taking differently: Take 100 mg by mouth 2 (two) times daily.) 90 tablet 6 10/01/2021 at 0830  ? HYDROcodone-acetaminophen (NORCO/VICODIN) 5-325 MG tablet Take 2 tablets by mouth every 6 (six) hours as needed for moderate pain. 20 tablet 0 10/01/2021 at 1600  ? insulin detemir (LEVEMIR FLEXTOUCH) 100 UNIT/ML FlexPen Inject 20 Units into the skin once daily at bedtime. (Patient taking differently: Inject 25 Units into the skin at bedtime.) 15 mL 5 09/28/2021 at prn  ? Insulin Pen Needle 32G X 4 MM MISC USE AS DIRECTED 100 each 0   ? isosorbide mononitrate (IMDUR) 30 MG 24 hr tablet Take 1 tablet (30 mg total) by mouth in the morning and at bedtime. 60 tablet 6 10/01/2021 at 0830  ? nitroGLYCERIN (NITROSTAT) 0.4 MG SL tablet Place 1 tablet (0.4 mg total) under the tongue every 5 (five) minutes as needed for chest pain. 30 tablet 0 prn at prn  ? rosuvastatin (CRESTOR) 40 MG tablet TAKE 1 TABLET BY MOUTH ONCE DAILY. 90 tablet 3 10/01/2021 at 0830  ? torsemide (DEMADEX) 20 MG tablet Take 20 mg by mouth daily as needed.   Past Week at prn  ? omeprazole (PRILOSEC) 20 MG capsule Take 20 mg by mouth daily. (Patient not taking: Reported on 10/01/2021)   Not Taking  ? sertraline (ZOLOFT) 50 MG tablet Take 50 mg by mouth daily. (Patient not taking: Reported on 10/01/2021)   Not Taking  ? ?Scheduled: ? apixaban  5 mg Oral BID  ?  carvedilol  25 mg Oral BID  ? Chlorhexidine Gluconate Cloth  6 each Topical Daily  ? epoetin (EPOGEN/PROCRIT) injection  4,000 Units Intravenous Q M,W,F-HD  ? ezetimibe  10 mg Oral Daily  ? hydrALAZINE  100 mg O

## 2021-10-04 NOTE — Assessment & Plan Note (Signed)
Status post emergent ligation of the AV fistula on 4/16 by vascular ?Patient continues complain of left arm pain, numbness, weakness and swelling ?

## 2021-10-04 NOTE — Progress Notes (Signed)
Inpatient Diabetes Program Recommendations ? ?AACE/ADA: New Consensus Statement on Inpatient Glycemic Control (2015) ? ?Target Ranges:  Prepandial:   less than 140 mg/dL ?     Peak postprandial:   less than 180 mg/dL (1-2 hours) ?     Critically ill patients:  140 - 180 mg/dL  ? ? Latest Reference Range & Units 10/03/21 07:16 10/03/21 12:20 10/03/21 20:06 10/03/21 23:52 10/04/21 03:55  ?Glucose-Capillary 70 - 99 mg/dL 259 (H) 362 (H) 115 (H) 236 (H) 169 (H)  ? ?Review of Glycemic Control ? ?Diabetes history: DM2 ?Outpatient Diabetes medications: Levemir 25 units QHS, Humalog TID per sliding scale ?Current orders for Inpatient glycemic control: Levemir 6 units daily, Novlog 0-9 units Q4H ?  ?Inpatient Diabetes Program Recommendations:   ? ?Insulin: Please consider increasing Levemir to 8 units daily and ordering Novolog 2 units TID with meals for meal coverage if patient eats at least 50% of meals. ? ?Thanks, ?Barnie Alderman, RN, MSN, CDE ?Diabetes Coordinator ?Inpatient Diabetes Program ?[phone] (Team Pager from 8am to 5pm) ? ? ?

## 2021-10-04 NOTE — Assessment & Plan Note (Addendum)
Blood pressure (!) 167/88, pulse 78, temperature 98.4 ?F (36.9 ?C), temperature source Oral, resp. rate 18, height 5\' 6"  (1.676 m), weight 65.9 kg, SpO2 96 %. ?Initially required nicardipine drip but now weaned off. ?continue patient on Imdur, hydralazine, labetalol, Coreg, along with as needed labetalol ?Added losartan 25 mg p.o. daily today for better blood pressure control ? ?

## 2021-10-04 NOTE — Progress Notes (Signed)
[facility] ?[place], [name] ?10/04/21 ? ?Subjective:  ? ?LOS: 3 ? ?Patient seen resting in bed, daughter at bedside ?Alert and oriented ?Currently appears very uncomfortable, complaining of left shoulder pain. ?Dialysis received yesterday, tolerated well ?Patient seen later in the morning in excruciating left shoulder pain.  Nurse at bedside providing IV pain medication.  Patient states this pain is different from the pain he was admitted for in his hand. ? ?Objective:  ?Vital signs in last 24 hours:  ?Temp:  [98 ?F (36.7 ?C)-98.4 ?F (36.9 ?C)] 98 ?F (36.7 ?C) (04/18 9833) ?Pulse Rate:  [71-81] 81 (04/18 0817) ?Resp:  [12-23] 18 (04/18 0817) ?BP: (141-199)/(74-120) 185/87 (04/18 0817) ?SpO2:  [95 %-100 %] 98 % (04/18 0817) ?Weight:  [65.9 kg] 65.9 kg (04/17 1900) ? ?Weight change:  ?Filed Weights  ? 10/03/21 1332 10/03/21 1900  ?Weight: 68.2 kg 65.9 kg  ? ? ?Intake/Output: ?  ? ?Intake/Output Summary (Last 24 hours) at 10/04/2021 1431 ?Last data filed at 10/04/2021 1024 ?Gross per 24 hour  ?Intake 220 ml  ?Output 526 ml  ?Net -306 ml  ? ? ? ?Physical Exam: ?General: Restless, grimacing moaning loudly  ?HEENT  anicteric, moist oral mucous membrane  ?Pulm/lungs  normal breathing effort, lungs are clear to auscultation  ?CVS/Heart  regular rhythm, no rub or gallop  ?Abdomen:   Soft, nontender  ?Extremities:  No peripheral edema, left hand warm  ?Neurologic:  Alert, oriented, able to follow commands  ?Skin:  No acute rashes  ?Right IJ tunneled dialysis catheter ? ? ? ?Basic Metabolic Panel: ? ?Recent Labs  ?Lab 09/29/21 ?1216 10/01/21 ?1937 10/03/21 ?0506  ?NA 139 133* 135  ?K 4.4 4.4 4.3  ?CL 102 96* 99  ?CO2  --  28 25  ?GLUCOSE 240* 515* 241*  ?BUN 32* 45* 58*  ?CREATININE 3.90* 4.06* 5.37*  ?CALCIUM  --  9.0 8.8*  ? ? ? ? ?CBC: ?Recent Labs  ?Lab 09/29/21 ?1216 10/01/21 ?1937 10/03/21 ?0506  ?WBC  --  5.4 6.7  ?NEUTROABS  --  4.1  --   ?HGB 10.9* 10.2* 11.1*  ?HCT 32.0* 31.6* 33.8*  ?MCV  --  88.3  88.5  ?PLT  --  176 176  ? ? ? ?  ?Lab Results  ?Component Value Date  ? HEPBSAG NON REACTIVE 08/08/2021  ? HEPBSAB NON REACTIVE 08/08/2021  ? ? ? ? ?Microbiology: ? ?Recent Results (from the past 240 hour(s))  ?Culture, blood (Routine X 2) w Reflex to ID Panel     Status: None (Preliminary result)  ? Collection Time: 10/01/21 10:29 PM  ? Specimen: BLOOD  ?Result Value Ref Range Status  ? Specimen Description BLOOD BLOOD RIGHT ARM  Final  ? Special Requests   Final  ?  BOTTLES DRAWN AEROBIC AND ANAEROBIC Blood Culture adequate volume  ? Culture   Final  ?  NO GROWTH 3 DAYS ?Performed at [facility], [address] [place], [place] [address] ?  ? Report Status PENDING  Incomplete  ?MRSA Next Gen by PCR, Nasal     Status: None  ? Collection Time: 10/02/21  1:04 AM  ? Specimen: Nasal Mucosa; Nasal Swab  ?Result Value Ref Range Status  ? MRSA by PCR Next Gen NOT DETECTED NOT DETECTED Final  ?  Comment: (NOTE) ?The GeneXpert MRSA Assay (FDA approved for NASAL specimens only), ?is one component of a comprehensive MRSA colonization surveillance ?program. It is not intended to diagnose MRSA infection nor to  guide ?or monitor treatment for MRSA infections. ?Test performance is not FDA approved in patients less than 2 years ?old. ?Performed at [facility], [place], ?[name] [address] ?  ?Culture, blood (Routine X 2) w Reflex to ID Panel     Status: None (Preliminary result)  ? Collection Time: 10/02/21 10:28 PM  ? Specimen: BLOOD  ?Result Value Ref Range Status  ? Specimen Description BLOOD RIGHT HAND  Final  ? Special Requests   Final  ?  BOTTLES DRAWN AEROBIC AND ANAEROBIC Blood Culture adequate volume  ? Culture   Final  ?  NO GROWTH 2 DAYS ?Performed at [facility], [address], [place], [place] [address] ?  ? Report Status PENDING  Incomplete  ? ? ?Coagulation Studies: ?No results for input(s): LABPROT, INR in the last 72 hours. ? ?Urinalysis: ?No results for input(s):  COLORURINE, LABSPEC, [place], GLUCOSEU, HGBUR, BILIRUBINUR, KETONESUR, PROTEINUR, UROBILINOGEN, NITRITE, LEUKOCYTESUR in the last 72 hours. ? ?Invalid input(s): APPERANCEUR  ? ? ?Imaging: ?CT HEAD W & WO CONTRAST (5MM) ? ?Result Date: 10/03/2021 ?CLINICAL DATA:  Transient ischemic attack (TIA) EXAM: CT HEAD WITHOUT AND WITH CONTRAST TECHNIQUE: Contiguous axial images were obtained from the base of the skull through the vertex without and with intravenous contrast. RADIATION DOSE REDUCTION: This exam was performed according to the departmental dose-optimization program which includes automated exposure control, adjustment of the mA and/or kV according to patient size and/or use of iterative reconstruction technique. CONTRAST:  7mL OMNIPAQUE IOHEXOL 300 MG/ML  SOLN COMPARISON:  CT head 09/06/2021 FINDINGS: Brain: There is no acute intracranial hemorrhage, mass, mass effect, or edema. No abnormal enhancement. Gray-white differentiation is preserved. There is no extra-axial fluid collection. Ventricles and sulci are within normal limits in size and configuration. Vascular: There is atherosclerotic calcification at the skull base. Skull: Calvarium is unremarkable. Sinuses/Orbits: Paranasal sinus mucosal thickening. Orbits are unremarkable. Other: None. IMPRESSION: No acute intracranial hemorrhage or evidence of acute infarction. No mass or enhancement. Electronically Signed   By: Macy Mis M.D.   On: 10/03/2021 07:46  ? ?US Carotid Bilateral ? ?Result Date: 10/03/2021 ?CLINICAL DATA:  Left arm weakness Left upper extremity ischemia EXAM: BILATERAL CAROTID DUPLEX ULTRASOUND TECHNIQUE: Pearline Cables scale imaging, color Doppler and duplex ultrasound were performed of bilateral carotid and vertebral arteries in the neck. COMPARISON:  None. FINDINGS: Criteria: Quantification of carotid stenosis is based on velocity parameters that correlate the residual internal carotid diameter with NASCET-based stenosis levels, using the  diameter of the distal internal carotid lumen as the denominator for stenosis measurement. The following velocity measurements were obtained: RIGHT ICA: 59/19 cm/sec CCA: 24/40 cm/sec SYSTOLIC ICA/CCA RATIO:  0.9 ECA: 94 cm/sec LEFT ICA: 245/64 cm/sec CCA: 10/27 cm/sec SYSTOLIC ICA/CCA RATIO:  3.3 ECA: 123 cm/sec RIGHT CAROTID ARTERY: No significant atheromatous plaque. RIGHT VERTEBRAL ARTERY:  Antegrade flow. LEFT CAROTID ARTERY: Mild atheromatous plaque of the carotid bifurcation. LEFT VERTEBRAL ARTERY:  Antegrade flow. IMPRESSION: 1. No significant stenosis of the right internal carotid artery. 2. 50-69% stenosis of the left internal carotid artery. The amount of plaque burden is less than expected for such high velocities. Velocities may be artifact. Consider further evaluation with CT angiography of the neck to better quantify degree of stenosis of the left internal carotid artery. Electronically Signed   By: Miachel Roux M.D.   On: 10/03/2021 10:51  ? ?US Venous Img Upper Uni Left (DVT) ? ?Result Date: 10/03/2021 ?CLINICAL DATA:  Left upper extremity pain  for 2 days Ischemia of left upper extremity. Left radiocephalic AV fistula ligation on 04/16. EXAM: LEFT UPPER EXTREMITY VENOUS DOPPLER ULTRASOUND TECHNIQUE: Gray-scale sonography with graded compression, as well as color Doppler and duplex ultrasound were performed to evaluate the upper extremity deep venous system from the level of the subclavian vein and including the jugular, axillary, basilic, radial, ulnar and upper cephalic vein. Spectral Doppler was utilized to evaluate flow at rest and with distal augmentation maneuvers. COMPARISON:  None. FINDINGS: Contralateral Subclavian Vein: Respiratory phasicity is normal and symmetric with the symptomatic side. No evidence of thrombus. Normal compressibility. Internal Jugular Vein: No evidence of thrombus. Normal compressibility, respiratory phasicity and response to augmentation. Subclavian Vein: No evidence  of thrombus. Normal compressibility, respiratory phasicity and response to augmentation. Axillary Vein: No evidence of thrombus. Normal compressibility, respiratory phasicity and response to augmentation.

## 2021-10-04 NOTE — Progress Notes (Signed)
2 Days Post-Op  ? ?Subjective/Chief Complaint: ?Patient with continued LEFT arm and hand pain with swelling of hand. States pain is intermittent but is severe at times from back of neck to hand. Able to move hand now ? ? ?Objective: ?Vital signs in last 24 hours: ?Temp:  [97.8 ?F (36.6 ?C)-98.4 ?F (36.9 ?C)] 98 ?F (36.7 ?C) (04/18 2122) ?Pulse Rate:  [71-81] 81 (04/18 0817) ?Resp:  [12-23] 18 (04/18 0817) ?BP: (126-199)/(74-120) 185/87 (04/18 0817) ?SpO2:  [95 %-100 %] 98 % (04/18 0817) ?Weight:  [65.9 kg-68.2 kg] 65.9 kg (04/17 1900) ?Last BM Date : 10/03/21 ? ?Intake/Output from previous day: ?04/17 0701 - 04/18 0700 ?In: 360 [P.O.:360] ?Out: 1326 [Urine:800] ?Intake/Output this shift: ?Total I/O ?In: 220 [P.O.:220] ?Out: -  ? ?General appearance: alert and mild distress ?Cardio: regular rate and rhythm ?Extremities: LEFT ARM: warm, palpable radial and ulnar pulse, Strength in hand now 3/5, sensory intact, edema of hand, incision- C/D/I, pain with movement at shoulder ? ?Lab Results:  ?Recent Labs  ?  10/01/21 ?1937 10/03/21 ?0506  ?WBC 5.4 6.7  ?HGB 10.2* 11.1*  ?HCT 31.6* 33.8*  ?PLT 176 176  ? ?BMET ?Recent Labs  ?  10/01/21 ?1937 10/03/21 ?0506  ?NA 133* 135  ?K 4.4 4.3  ?CL 96* 99  ?CO2 28 25  ?GLUCOSE 515* 241*  ?BUN 45* 58*  ?CREATININE 4.06* 5.37*  ?CALCIUM 9.0 8.8*  ? ?PT/INR ?No results for input(s): LABPROT, INR in the last 72 hours. ?ABG ?Recent Labs  ?  10/01/21 ?2013  ?HCO3 25.0  ? ? ?Studies/Results: ?CT HEAD W & WO CONTRAST (5MM) ? ?Result Date: 10/03/2021 ?CLINICAL DATA:  Transient ischemic attack (TIA) EXAM: CT HEAD WITHOUT AND WITH CONTRAST TECHNIQUE: Contiguous axial images were obtained from the base of the skull through the vertex without and with intravenous contrast. RADIATION DOSE REDUCTION: This exam was performed according to the departmental dose-optimization program which includes automated exposure control, adjustment of the mA and/or kV according to patient size and/or use of  iterative reconstruction technique. CONTRAST:  68mL OMNIPAQUE IOHEXOL 300 MG/ML  SOLN COMPARISON:  CT head 09/06/2021 FINDINGS: Brain: There is no acute intracranial hemorrhage, mass, mass effect, or edema. No abnormal enhancement. Gray-white differentiation is preserved. There is no extra-axial fluid collection. Ventricles and sulci are within normal limits in size and configuration. Vascular: There is atherosclerotic calcification at the skull base. Skull: Calvarium is unremarkable. Sinuses/Orbits: Paranasal sinus mucosal thickening. Orbits are unremarkable. Other: None. IMPRESSION: No acute intracranial hemorrhage or evidence of acute infarction. No mass or enhancement. Electronically Signed   By: Macy Mis M.D.   On: 10/03/2021 07:46  ? ?US Carotid Bilateral ? ?Result Date: 10/03/2021 ?CLINICAL DATA:  Left arm weakness Left upper extremity ischemia EXAM: BILATERAL CAROTID DUPLEX ULTRASOUND TECHNIQUE: Pearline Cables scale imaging, color Doppler and duplex ultrasound were performed of bilateral carotid and vertebral arteries in the neck. COMPARISON:  None. FINDINGS: Criteria: Quantification of carotid stenosis is based on velocity parameters that correlate the residual internal carotid diameter with NASCET-based stenosis levels, using the diameter of the distal internal carotid lumen as the denominator for stenosis measurement. The following velocity measurements were obtained: RIGHT ICA: 59/19 cm/sec CCA: 48/25 cm/sec SYSTOLIC ICA/CCA RATIO:  0.9 ECA: 94 cm/sec LEFT ICA: 245/64 cm/sec CCA: 00/37 cm/sec SYSTOLIC ICA/CCA RATIO:  3.3 ECA: 123 cm/sec RIGHT CAROTID ARTERY: No significant atheromatous plaque. RIGHT VERTEBRAL ARTERY:  Antegrade flow. LEFT CAROTID ARTERY: Mild atheromatous plaque of the carotid bifurcation. LEFT VERTEBRAL ARTERY:  Antegrade flow. IMPRESSION: 1. No significant stenosis of the right internal carotid artery. 2. 50-69% stenosis of the left internal carotid artery. The amount of plaque burden is  less than expected for such high velocities. Velocities may be artifact. Consider further evaluation with CT angiography of the neck to better quantify degree of stenosis of the left internal carotid artery. Electronically Signed   By: Miachel Roux M.D.   On: 10/03/2021 10:51  ? ?US Venous Img Upper Uni Left (DVT) ? ?Result Date: 10/03/2021 ?CLINICAL DATA:  Left upper extremity pain for 2 days Ischemia of left upper extremity. Left radiocephalic AV fistula ligation on 04/16. EXAM: LEFT UPPER EXTREMITY VENOUS DOPPLER ULTRASOUND TECHNIQUE: Gray-scale sonography with graded compression, as well as color Doppler and duplex ultrasound were performed to evaluate the upper extremity deep venous system from the level of the subclavian vein and including the jugular, axillary, basilic, radial, ulnar and upper cephalic vein. Spectral Doppler was utilized to evaluate flow at rest and with distal augmentation maneuvers. COMPARISON:  None. FINDINGS: Contralateral Subclavian Vein: Respiratory phasicity is normal and symmetric with the symptomatic side. No evidence of thrombus. Normal compressibility. Internal Jugular Vein: No evidence of thrombus. Normal compressibility, respiratory phasicity and response to augmentation. Subclavian Vein: No evidence of thrombus. Normal compressibility, respiratory phasicity and response to augmentation. Axillary Vein: No evidence of thrombus. Normal compressibility, respiratory phasicity and response to augmentation. Cephalic Vein: No evidence of thrombus. Normal compressibility, respiratory phasicity and response to augmentation. Basilic Vein: No evidence of thrombus. Normal compressibility, respiratory phasicity and response to augmentation. Brachial Veins: No evidence of thrombus. Normal compressibility, respiratory phasicity and response to augmentation. Radial Veins: No evidence of thrombus. Normal compressibility, respiratory phasicity and response to augmentation. Ulnar Veins: No evidence  of thrombus. Normal compressibility, respiratory phasicity and response to augmentation. Venous Reflux:  None visualized. Other Findings:  None visualized. IMPRESSION: No evidence of DVT within the left upper extremity. Electronically Signed   By: Miachel Roux M.D.   On: 10/03/2021 10:54   ? ?Anti-infectives: ?Anti-infectives (From admission, onward)  ? ? Start     Dose/Rate Route Frequency Ordered Stop  ? 10/01/21 2300  ceFEPIme (MAXIPIME) 1 g in sodium chloride 0.9 % 100 mL IVPB  Status:  Discontinued       ? 1 g ?200 mL/hr over 30 Minutes Intravenous Every 24 hours 10/01/21 2213 10/02/21 1244  ? 10/01/21 2215  vancomycin (VANCOREADY) IVPB 1500 mg/300 mL       ? 1,500 mg ?150 mL/hr over 120 Minutes Intravenous  Once 10/01/21 2203 10/02/21 0202  ? 10/01/21 2202  vancomycin variable dose per unstable renal function (pharmacist dosing)  Status:  Discontinued       ?  Does not apply See admin instructions 10/01/21 2203 10/02/21 1244  ? ?  ? ? ?Assessment/Plan: ?s/p Procedure(s): ?LIGATION OF ARTERIOVENOUS  FISTULA (Left) ?Patient with continued pain in LEFT arm and hand- improved but remains significant at times. All imaging thus far is without etiology of pain. ?Will discuss with Primary team. ?Consider Neurology consult ?Continue HD via permacath ?Will reevaluate outpatient for other longt erm HD access ? LOS: 3 days  ? ? ?Lurlene Ronda A ?10/04/2021 ? ?

## 2021-10-04 NOTE — TOC Progression Note (Signed)
Transition of Care (TOC) - Progression Note  ? ? ?Patient Details  ?Name: Jimmy Olson ?MRN: [id number] ?Date of Birth: 09-13-1960 ? ?Transition of Care (TOC) CM/SW Contact  ?Candie Chroman, LCSW ?Phone Number: ?10/04/2021, 1:20 PM ? ?Clinical Narrative:   Came by to discuss SNF recommendations. Per RN, he is currently getting an ultrasound. Will try again later. ? ?Expected Discharge Plan: Home/Self Care ?Barriers to Discharge: Continued Medical Work up ? ?Expected Discharge Plan and Services ?Expected Discharge Plan: Home/Self Care ?  ?  ?Post Acute Care Choice: NA ?Living arrangements for the past 2 months: [place] ?                ?  ?  ?  ?  ?  ?  ?  ?  ?  ?  ? ? ?Social Determinants of Health (SDOH) Interventions ?  ? ?Readmission Risk Interventions ? ?  10/03/2021  ?  2:32 PM 08/08/2021  ?  8:49 AM 06/01/2021  ?  2:05 PM  ?Readmission Risk Prevention Plan  ?Transportation Screening Complete Complete Complete  ?PCP or Specialist Appt within 3-5 Days Complete Complete Complete  ?[place] or Home Care Consult  Complete Complete  ?Social Work Consult for [place] Planning/Counseling Complete Complete Complete  ?Palliative Care Screening Not Applicable Not Applicable Not Applicable  ?Medication Review Press photographer) Complete Complete Referral to Pharmacy  ? ? ?

## 2021-10-04 NOTE — Assessment & Plan Note (Signed)
Patient is on Eliquis for his history of DVT in December 2022 ?Continue Eliquis ?

## 2021-10-04 NOTE — Evaluation (Signed)
Occupational Therapy Evaluation ?Patient Details ?Name: Jimmy Olson ?MRN: [id number] ?DOB: [birth year] ?Today's Date: 10/04/2021 ? ? ?History of Present Illness presented to ER secondary to L UE swelling, edema; admitted for management/evaluation of possible STEAL syndrome to L UE (s/p L radialcephalic AV fistula creation 09/29/21).  Hospital course significant for L UE emergent ligation; now pending neuro consult.  ? ?Clinical Impression ?  ?Jimmy Olson was seen for OT/PT co-evaluation this date. Prior to hospital admission, pt was MOD I for moblity using RW as needed, endorses ~3 recent falls. Pt lives with spouse in home c 3 STE. Pt presents to acute OT demonstrating impaired ADL performance and functional mobility 2/2 decreased activity tolerance, increased edema, and impaired functional use of non-dominant LUE. Pt endorses 10/10 pain in LUE at rest and with mobility.  ? ?Pt currently requires MIN A exit R side of bed. MIN A for LB access seated EOB - uses RUE only. Anticipate MOD A for UBD in sitting, poor tolerance for LUE ROM. MIN A x2 sit<>stand - limited by dizziness and pain. Pt would benefit from skilled OT to address noted impairments and functional limitations (see below for any additional details). Upon hospital discharge, recommend STR to maximize pt safety and return to PLOF.   ? ?Recommendations for follow up therapy are one component of a multi-disciplinary discharge planning process, led by the attending physician.  Recommendations may be updated based on patient status, additional functional criteria and insurance authorization.  ? ?Follow Up Recommendations ? Skilled nursing-short term rehab (<3 hours/day)  ?  ?Assistance Recommended at Discharge Frequent or constant Supervision/Assistance  ?Patient can return home with the following A lot of help with walking and/or transfers;A lot of help with bathing/dressing/bathroom;Help with stairs or ramp for entrance ? ?  ?Functional Status Assessment ?  Patient has had a recent decline in their functional status and demonstrates the ability to make significant improvements in function in a reasonable and predictable amount of time.  ?Equipment Recommendations ? BSC/3in1  ?  ?Recommendations for Other Services   ? ? ?  ?Precautions / Restrictions Precautions ?Precautions: Fall ?Restrictions ?Weight Bearing Restrictions: No  ? ?  ? ?Mobility Bed Mobility ?Overal bed mobility: Needs Assistance ?Bed Mobility: Sit to Supine, Supine to Sit ?  ?  ?Supine to sit: Min assist ?Sit to supine: Min guard ?  ?  ?  ? ?Transfers ?Overall transfer level: Needs assistance ?Equipment used: 2 person hand held assist ?Transfers: Sit to/from Stand ?Sit to Stand: Min assist, +2 physical assistance ?  ?  ?  ?  ?  ?General transfer comment: tolerates ~15 seconds ?  ? ?  ?Balance Overall balance assessment: Needs assistance ?Sitting-balance support: No upper extremity supported, Feet supported ?Sitting balance-Leahy Scale: Good ?  ?  ?Standing balance support: Bilateral upper extremity supported ?Standing balance-Leahy Scale: Fair ?  ?  ?  ?  ?  ?  ?  ?  ?  ?  ?  ?  ?   ? ?ADL either performed or assessed with clinical judgement  ? ?ADL Overall ADL's : Needs assistance/impaired ?  ?  ?  ?  ?  ?  ?  ?  ?  ?  ?  ?  ?  ?  ?  ?  ?  ?  ?  ?General ADL Comments: MIN A for LB access seated EOB - uses RUE only. MOD A don gown in sitting. MIN A x2 for ADL t/f - limited by  dizziness and pain  ? ? ? ? ?Pertinent Vitals/Pain Pain Assessment ?Pain Assessment: Faces ?Faces Pain Scale: Hurts worst ?Pain Location: LUE - shoulder worst ?Pain Descriptors / Indicators: Moaning, Restless, Crying ?Pain Intervention(s): Limited activity within patient's tolerance, Repositioned, Ice applied  ? ? ? ?Hand Dominance Right ?  ?Extremity/Trunk Assessment Upper Extremity Assessment ?Upper Extremity Assessment: LUE deficits/detail ?LUE Deficits / Details: does not achieve full fist 2/2 edema/pain, moves digits/wrist 3/5  grossly. 2-/5 at shoulder/elbow ?LUE: Unable to fully assess due to pain ?  ?Lower Extremity Assessment ?Lower Extremity Assessment: Overall WFL for tasks assessed ?  ?  ?  ?Communication Communication ?Communication: Prefers language other than [place];Interpreter utilized Margaree Mackintosh Lukaitis present for interpreter services) ?  ?Cognition Arousal/Alertness: Awake/alert ?Behavior During Therapy: [facility] for tasks assessed/performed ?Overall Cognitive Status: Within Functional Limits for tasks assessed ?  ?  ?  ?  ?  ?  ?  ?  ?  ?  ?  ?  ?  ?  ?  ?  ?  ?  ?  ?   ?   ?   ? ? ?Home Living Family/patient expects to be discharged to:: Private residence ?Living Arrangements: Spouse/significant other;Children ?Available Help at Discharge: Family;Available PRN/intermittently ?Type of Home: House ?Home Access: Stairs to enter ?Entrance Stairs-Number of Steps: 2-3 ?Entrance Stairs-Rails: Right;Left;Can reach both ?Home Layout: One level ?  ?  ?  ?  ?  ?  ?  ?Home Equipment: Conservation officer, nature (2 wheels) ?  ?  ?  ? ?  ?Prior Functioning/Environment Prior Level of Function : Independent/Modified Independent ?  ?  ?  ?  ?  ?  ?Mobility Comments: Intermittent use of RW at baseline; does endorse multiple falls due to dizziness at times ?ADLs Comments: Ind in self care. His wife assists with IADL tasks. He does not drive. ?  ? ?  ?  ?OT Problem List: Decreased strength;Decreased range of motion;Decreased activity tolerance;Impaired balance (sitting and/or standing);Decreased safety awareness;Pain;Impaired UE functional use ?  ?   ?OT Treatment/Interventions: Self-care/ADL training;Therapeutic exercise;Energy conservation;DME and/or AE instruction;Therapeutic activities;Patient/family education;Balance training  ?  ?OT Goals(Current goals can be found in the care plan section) Acute Rehab OT Goals ?Patient Stated Goal: to improve pain ?OT Goal Formulation: With patient/family ?Time For Goal Achievement: 10/18/21 ?Potential to Achieve Goals:  Good ?ADL Goals ?Pt Will Perform Grooming: sitting;with set-up;with supervision ?Pt Will Perform Lower Body Dressing: with modified independence;sitting/lateral leans ?Pt Will Transfer to Toilet: with min guard assist;ambulating;regular height toilet  ?OT Frequency: Min 2X/week ?  ? ?Co-evaluation PT/OT/SLP Co-Evaluation/Treatment: Yes ?Reason for Co-Treatment: To address functional/ADL transfers;Complexity of the patient's impairments (multi-system involvement) ?PT goals addressed during session: Mobility/safety with mobility ?OT goals addressed during session: ADL's and self-care ?  ? ?  ?AM-PAC OT "6 Clicks" Daily Activity     ?Outcome Measure Help from another person eating meals?: A Little ?Help from another person taking care of personal grooming?: A Little ?Help from another person toileting, which includes using toliet, bedpan, or urinal?: A Lot ?Help from another person bathing (including washing, rinsing, drying)?: A Lot ?Help from another person to put on and taking off regular upper body clothing?: A Lot ?Help from another person to put on and taking off regular lower body clothing?: A Little ?6 Click Score: 15 ?  ?End of Session   ? ?Activity Tolerance: Patient tolerated treatment well ?Patient left: in bed;with call bell/phone within reach;with bed alarm set;with family/visitor present ? ?OT Visit  Diagnosis: Other abnormalities of gait and mobility (R26.89);Muscle weakness (generalized) (M62.81)  ?              ?Time: 1497-0263 ?OT Time Calculation (min): 24 min ?Charges:  OT General Charges ?$OT Visit: 1 Visit ?OT Evaluation ?$OT Eval Moderate Complexity: 1 Mod ?OT Treatments ?$Self Care/Home Management : 8-22 mins ? ?Dessie Coma, M.S. OTR/L  ?10/04/21, 11:19 AM  ?ascom [phone] ? ?

## 2021-10-04 NOTE — Progress Notes (Signed)
?Progress Note ? ? ?Patient: Jimmy Olson ZOX:[id number] DOB: March 01, 1961 DOA: 10/01/2021     3 ?DOS: the patient was seen and examined on 10/04/2021 ?  ?Brief hospital course: ?61 y.o. male with medical history significant of diabetes mellitus type 2, end-stage renal disease on CAD, history of DVT presenting with left arm pain swelling and numbness x1 day admitted for left steal syndrome  ? ?4/16: Ligation of left radiocephalic AV fistula ?4/17: Transfer to floor.  Carotid Doppler showed no hemodynamically significant stenosis.  Left upper extremity Doppler negative for DVT ?4/18: Neurology, PT, OT consult, MRI of the brain ? ? ?Assessment and Plan: ?* Diffuse pain in left upper extremity ?Still has weakness, swelling and severe pain.  Left upper extremity Doppler negative for DVT.  Carotid Dopplers showing no hemodynamically significant stenosis.  Vascular surgery recommends neurology, PT OT and pain control.  A lot of his symptoms has worsened since AV fistula placement on 4/13.  He did have some residual weakness from prior stroke in the same area ? ?Neurology recommending MRI of the brain (I have ordered), if negative, consider MRI of the left upper extremity to further assess ? ? ? ? ?Hypertensive emergency ?Blood pressure (!) 167/88, pulse 78, temperature 98.4 ?F (36.9 ?C), temperature source Oral, resp. rate 18, height 5\' 6"  (1.676 m), weight 65.9 kg, SpO2 96 %. ?Initially required nicardipine drip but now weaned off. ?continue patient on Imdur, hydralazine, labetalol, Coreg, along with as needed labetalol ?Added losartan 25 mg p.o. daily today for better blood pressure control ? ? ?HFrEF (heart failure with reduced ejection fraction) ([place]) ?EF 40 to 45% during echo in February 2023 ?Continue patient on Coreg, Imdur, labetalol, hydralazine and losartan ?cardiology is following ? ?Chest pain ?None while here.  Monitor on telemetry-ejection fraction 40 to 45% on last cath ? ? ?Coronary artery disease involving  native coronary artery of native heart without angina pectoris ?Cardiology following.  Continue Coreg and Imdur, aspirin.  He denies any anginal symptoms ? ? ?Type 2 diabetes mellitus with hyperlipidemia ([place]) ?Glycemic protocol per every 6 hours Accu-Chek.  diabetic nurse following. ?Increase Levemir to 8 units subcu daily and add NovoLog 2 units subcu with each meals.  Continue sliding scale ? ?ESRD on hemodialysis ([place]) ?HD per nephrology ? ?History of DVT (deep vein thrombosis) ?Patient is on Eliquis for his history of DVT in December 2022 ?Continue Eliquis ? ?Steal syndrome as complication of dialysis access [facility]) ?Status post emergent ligation of the AV fistula on 4/16 by vascular ?Patient continues complain of left arm pain, numbness, weakness and swelling ? ? ? ? ?  ? ?Subjective: Complains of left upper extremity pain, weakness and difficulty with activity ? ?Physical Exam: ?Vitals:  ? 10/03/21 1936 10/04/21 0359 10/04/21 0817 10/04/21 1532  ?BP: (!) 196/88 (!) 168/84 (!) 185/87 (!) 167/88  ?Pulse: 76 77 81 78  ?Resp: 18 20 18 18   ?Temp: 98 ?F (36.7 ?C) 98.4 ?F (36.9 ?C) 98 ?F (36.7 ?C) 98.4 ?F (36.9 ?C)  ?TempSrc:   Oral Oral  ?SpO2: 99% 97% 98% 96%  ?Weight:      ?Height:      ? ?61 year old male lying in the bed comfortably without any acute distress ?Lungs clear to auscultation bilaterally, no wheezing rales rhonchi or crepitation ?Cardiovascular S1-S2 normal, no murmur rubs or gallop ?Abdomen soft, benign ?Neuro alert and oriented, nonfocal ?Left upper extremity: Significant tenderness, swelling and limited mobility likely due to severe pain ?Psych normal mood and  affect ?HD access-right IJ tunneled dialysis catheter ? ?Data Reviewed: ? ?Hemoglobin 11.1 ? ?Family Communication: Wife and son updated at bedside ? ?Disposition: ?Status is: Inpatient ?Remains inpatient appropriate because: Likely needs SNF placement per PT, OT eval.  Getting MRI of the brain tomorrow left upper extremity symptoms ? ?  Planned Discharge Destination: Skilled nursing facility ? ? ? DVT prophylaxis-Eliquis ?Time spent: 35 minutes ? ?Author: Max Sane, MD ?10/04/2021 9:08 PM ? ?For on call review [email or web address].  ?

## 2021-10-04 NOTE — Assessment & Plan Note (Signed)
EF 40 to 45% during echo in February 2023 ?Continue patient on Coreg, Imdur, labetalol, hydralazine and losartan ?cardiology is following ?

## 2021-10-04 NOTE — Assessment & Plan Note (Signed)
Still has weakness, swelling and severe pain.  Left upper extremity Doppler negative for DVT.  Carotid Dopplers showing no hemodynamically significant stenosis.  Vascular surgery recommends neurology, PT OT and pain control.  A lot of his symptoms has worsened since AV fistula placement on 4/13.  He did have some residual weakness from prior stroke in the same area ? ?Neurology recommending MRI of the brain (I have ordered), if negative, consider MRI of the left upper extremity to further assess ? ? ? ?

## 2021-10-04 NOTE — Assessment & Plan Note (Signed)
HD per nephrology ?

## 2021-10-04 NOTE — Evaluation (Signed)
Physical Therapy Evaluation ?Patient Details ?Name: Jimmy Olson ?MRN: [id number] ?DOB: [birth year] ?Today's Date: 10/04/2021 ? ?History of Present Illness ? presented to ER secondary to L UE swelling, edema; admitted for management/evaluation of possible STEAL syndrome to L UE (s/p L radialcephalic AV fistula creation 09/29/21).  Hospital course significant for L UE emergent ligation; now pending neuro consult.  ?Clinical Impression ? Patient seated edge of bed with OT upon arrival to session; L UE supported on pillows for comfort.  Patient with continuous grimacing, moaning in pain despite position change, UE support.  L UE grossly edematous, distal > proximal; tolerates very minimal movement to L shoulder/elbow due to pain.  Currently able to complete bed mobility with cga/close sup; sit/stand, and standing balance with bilat UE support.  Tolerates standing only 10-15 seconds due to pain and onset of dizziness (requiring return to seated position).  Attempted formal orthostatics, but patient unable to maintain upright position due to pain, persistent dizziness.  BP stable and WFL once read in supine.  Will continue to assess and progress in subsequent sessions as appropriate. ?Would benefit from skilled PT to address above deficits and promote optimal return to PLOF; recommend transition to STR upon discharge from acute hospitalization, given inability to safely negotiate household environment at this time. ? ?   ? ?Recommendations for follow up therapy are one component of a multi-disciplinary discharge planning process, led by the attending physician.  Recommendations may be updated based on patient status, additional functional criteria and insurance authorization. ? ?Follow Up Recommendations Skilled nursing-short term rehab (<3 hours/day) ? ?  ?Assistance Recommended at Discharge Frequent or constant Supervision/Assistance  ?Patient can return home with the following ? A lot of help with walking and/or  transfers;A lot of help with bathing/dressing/bathroom;Assistance with cooking/housework;Help with stairs or ramp for entrance;Assist for transportation ? ?  ?Equipment Recommendations    ?Recommendations for Other Services ?    ?  ?Functional Status Assessment Patient has had a recent decline in their functional status and demonstrates the ability to make significant improvements in function in a reasonable and predictable amount of time.  ? ?  ?Precautions / Restrictions Precautions ?Precautions: Fall ?Restrictions ?Weight Bearing Restrictions: No  ? ?  ? ?Mobility ? Bed Mobility ?Overal bed mobility: Needs Assistance ?Bed Mobility: Sit to Supine ?  ?  ?  ?Sit to supine: Supervision, Min guard ?  ?  ?  ? ?Transfers ?Overall transfer level: Needs assistance ?Equipment used: 2 person hand held assist ?Transfers: Sit to/from Stand ?Sit to Stand: Min assist, +2 physical assistance ?  ?  ?  ?  ?  ?General transfer comment: assist for lift off, steadying and L UE support as able; unable to tolerate greater than 10-15 seconds due to dizziness with transition to upright ?  ? ?Ambulation/Gait ?  ?  ?  ?  ?  ?  ?  ?General Gait Details: deferred due to dizziness, pain ? ?Stairs ?  ?  ?  ?  ?  ? ?Wheelchair Mobility ?  ? ?Modified Rankin (Stroke Patients Only) ?  ? ?  ? ?Balance Overall balance assessment: Needs assistance ?Sitting-balance support: No upper extremity supported, Feet supported ?Sitting balance-Leahy Scale: Good ?  ?  ?Standing balance support: Bilateral upper extremity supported ?Standing balance-Leahy Scale: Fair ?  ?  ?  ?  ?  ?  ?  ?  ?  ?  ?  ?  ?   ? ? ? ?Pertinent Vitals/Pain Pain  Assessment ?Pain Assessment: Faces ?Faces Pain Scale: Hurts worst ?Pain Location: L UE ?Pain Descriptors / Indicators: Moaning, Restless, Crying ?Pain Intervention(s): Limited activity within patient's tolerance, Monitored during session, Repositioned, Ice applied (ice pack to L shoulder cleared per attending and RN)   ? ? ?Home Living Family/patient expects to be discharged to:: Private residence ?Living Arrangements: Spouse/significant other;Children ?Available Help at Discharge: Family ?Type of Home: House ?Home Access: Stairs to enter ?Entrance Stairs-Rails: Right;Left;Can reach both ?Entrance Stairs-Number of Steps: 2-3 ?  ?Home Layout: One level ?Home Equipment: Conservation officer, nature (2 wheels) ?   ?  ?Prior Function Prior Level of Function : Independent/Modified Independent ?  ?  ?  ?  ?  ?  ?Mobility Comments: Intermittent use of RW at baseline; does endorse multiple falls due to dizziness at times ?  ?  ? ? ?Hand Dominance  ? Dominant Hand: Right ? ?  ?Extremity/Trunk Assessment  ? Upper Extremity Assessment ?Upper Extremity Assessment:  (L UE grossly edematous, distal > proximal; 2-/5 strength L shoulder, elbow, 3+/5 L wrist and hand) ?  ? ?Lower Extremity Assessment ?Lower Extremity Assessment: Overall WFL for tasks assessed (grossly at least 4/5 throughout; supports body weight without buckling) ?  ? ?   ?Communication  ? Communication: Prefers language other than [place];Interpreter utilized Margaree Mackintosh Lukaitis present for interpreter services)  ?Cognition Arousal/Alertness: Awake/alert ?Behavior During Therapy: [facility] for tasks assessed/performed ?Overall Cognitive Status: Within Functional Limits for tasks assessed ?  ?  ?  ?  ?  ?  ?  ?  ?  ?  ?  ?  ?  ?  ?  ?  ?  ?  ?  ? ?  ?General Comments   ? ?  ?Exercises Other Exercises ?Other Exercises: Attempted orthostatic assessment after reports of dizziness-unable to tolerate continued upright position for formal assessment.  BP (upon return to supine) 128/81; HR 85  ? ?Assessment/Plan  ?  ?PT Assessment Patient needs continued PT services  ?PT Problem List Decreased strength;Decreased range of motion;Decreased activity tolerance;Decreased balance;Decreased mobility;Pain ? ?   ?  ?PT Treatment Interventions DME instruction;Gait training;Stair training;Functional mobility  training;Therapeutic activities;Therapeutic exercise;Balance training;Patient/family education   ? ?PT Goals (Current goals can be found in the Care Plan section)  ?Acute Rehab PT Goals ?Patient Stated Goal: to make the pain better ?PT Goal Formulation: With patient/family ?Time For Goal Achievement: 10/18/21 ?Potential to Achieve Goals: Fair ? ?  ?Frequency Min 2X/week ?  ? ? ?Co-evaluation PT/OT/SLP Co-Evaluation/Treatment: Yes ?Reason for Co-Treatment: Complexity of the patient's impairments (multi-system involvement) ?PT goals addressed during session: Mobility/safety with mobility ?OT goals addressed during session: ADL's and self-care ?  ? ? ?  ?AM-PAC PT "6 Clicks" Mobility  ?Outcome Measure Help needed turning from your back to your side while in a flat bed without using bedrails?: None ?Help needed moving from lying on your back to sitting on the side of a flat bed without using bedrails?: A Little ?Help needed moving to and from a bed to a chair (including a wheelchair)?: A Little ?Help needed standing up from a chair using your arms (e.g., wheelchair or bedside chair)?: A Little ?Help needed to walk in hospital room?: A Lot ?Help needed climbing 3-5 steps with a railing? : A Lot ?6 Click Score: 17 ? ?  ?End of Session   ?Activity Tolerance: Patient limited by pain ?Patient left: in bed;with bed alarm set;with family/visitor present ?Nurse Communication: Mobility status ?PT Visit Diagnosis: Pain;Muscle weakness (generalized) (  M62.81);Difficulty in walking, not elsewhere classified (R26.2) ?Pain - Right/Left: Left ?Pain - part of body: Shoulder;Arm ?  ? ?Time: 4081-4481 ?PT Time Calculation (min) (ACUTE ONLY): 19 min ? ? ?Charges:   PT Evaluation ?$PT Eval Moderate Complexity: 1 Mod ?  ?  ?   ? ? ?Karliah Kowalchuk H. Owens Shark, PT, DPT, NCS ?10/04/21, 10:41 AM ?[phone] ? ? ?

## 2021-10-05 ENCOUNTER — Inpatient Hospital Stay: Payer: Medicare Other

## 2021-10-05 DIAGNOSIS — M79602 Pain in left arm: Secondary | ICD-10-CM | POA: Diagnosis not present

## 2021-10-05 DIAGNOSIS — T82898A Other specified complication of vascular prosthetic devices, implants and grafts, initial encounter: Secondary | ICD-10-CM | POA: Diagnosis not present

## 2021-10-05 DIAGNOSIS — M7989 Other specified soft tissue disorders: Secondary | ICD-10-CM | POA: Diagnosis not present

## 2021-10-05 DIAGNOSIS — I161 Hypertensive emergency: Secondary | ICD-10-CM | POA: Diagnosis not present

## 2021-10-05 LAB — CBC
HCT: 30.8 % — ABNORMAL LOW (ref 39.0–52.0)
Hemoglobin: 10 g/dL — ABNORMAL LOW (ref 13.0–17.0)
MCH: 28.7 pg (ref 26.0–34.0)
MCHC: 32.5 g/dL (ref 30.0–36.0)
MCV: 88.3 fL (ref 80.0–100.0)
Platelets: 163 10*3/uL (ref 150–400)
RBC: 3.49 MIL/uL — ABNORMAL LOW (ref 4.22–5.81)
RDW: 13.8 % (ref 11.5–15.5)
WBC: 5.7 10*3/uL (ref 4.0–10.5)
nRBC: 0 % (ref 0.0–0.2)

## 2021-10-05 LAB — GLUCOSE, CAPILLARY
Glucose-Capillary: 113 mg/dL — ABNORMAL HIGH (ref 70–99)
Glucose-Capillary: 124 mg/dL — ABNORMAL HIGH (ref 70–99)
Glucose-Capillary: 129 mg/dL — ABNORMAL HIGH (ref 70–99)
Glucose-Capillary: 174 mg/dL — ABNORMAL HIGH (ref 70–99)
Glucose-Capillary: 214 mg/dL — ABNORMAL HIGH (ref 70–99)
Glucose-Capillary: 232 mg/dL — ABNORMAL HIGH (ref 70–99)
Glucose-Capillary: 250 mg/dL — ABNORMAL HIGH (ref 70–99)

## 2021-10-05 LAB — HEPATITIS B SURFACE ANTIBODY,QUALITATIVE: Hep B S Ab: NONREACTIVE

## 2021-10-05 LAB — HEPATITIS B SURFACE ANTIGEN: Hepatitis B Surface Ag: NONREACTIVE

## 2021-10-05 MED ORDER — LORAZEPAM 2 MG/ML IJ SOLN
1.0000 mg | Freq: Once | INTRAMUSCULAR | Status: AC
  Administered 2021-10-05: 1 mg via INTRAVENOUS
  Filled 2021-10-05: qty 1

## 2021-10-05 MED ORDER — IBUPROFEN 400 MG PO TABS
400.0000 mg | ORAL_TABLET | Freq: Two times a day (BID) | ORAL | Status: DC
  Administered 2021-10-05 – 2021-10-06 (×3): 400 mg via ORAL
  Filled 2021-10-05 (×4): qty 1

## 2021-10-05 MED ORDER — HEPARIN SODIUM (PORCINE) 1000 UNIT/ML DIALYSIS
20.0000 [IU]/kg | INTRAMUSCULAR | Status: DC | PRN
  Administered 2021-10-05: 3800 [IU] via INTRAVENOUS_CENTRAL

## 2021-10-05 MED ORDER — LOSARTAN POTASSIUM 50 MG PO TABS
50.0000 mg | ORAL_TABLET | Freq: Every day | ORAL | Status: DC
  Administered 2021-10-05 – 2021-10-07 (×3): 50 mg via ORAL
  Filled 2021-10-05 (×4): qty 1

## 2021-10-05 NOTE — Progress Notes (Signed)
?Progress Note ? ? ?Patient: Jimmy Olson GXQ:[id number] DOB: 03-04-1961 DOA: 10/01/2021     4 ?DOS: the patient was seen and examined on 10/05/2021 ?  ?Brief hospital course: ?61 y.o. male with medical history significant of diabetes mellitus type 2, end-stage renal disease on CAD, history of DVT presenting with left arm pain swelling and numbness x1 day admitted for left steal syndrome  ? ?4/16: Ligation of left radiocephalic AV fistula ?4/17: Transfer to floor.  Carotid Doppler showed no hemodynamically significant stenosis.  Left upper extremity Doppler negative for DVT ?4/18: Neurology, PT/OT evaluations, MRI of the brain negative for acute stroke ?4/19: dialysis, plan for MRI of Left UE to further evaluate ? ?Assessment and Plan: ?* Diffuse pain in left upper extremity ?Still has weakness, swelling and severe pain.  Left upper extremity Doppler negative for DVT.  Carotid Dopplers showing no hemodynamically significant stenosis.  Vascular surgery recommends neurology, PT OT and pain control.  A lot of his symptoms has worsened since AV fistula placement on 4/13.  He did have some residual weakness from prior stroke in the same area ? ?Neurology recommended MRI of the brain which was negative for stroke.   ? ?--MRI of the left upper extremity to further assess - pending ? ? ? ? ?Steal syndrome as complication of dialysis access [facility]) ?Status post emergent ligation of the AV fistula on 4/16 by vascular ?Patient continues complain of left arm pain, numbness, weakness and swelling ? ?Hypertensive emergency ?Blood pressure (!) 167/88, pulse 78, temperature 98.4 ?F (36.9 ?C), temperature source Oral, resp. rate 18, height 5\' 6"  (1.676 m), weight 65.9 kg, SpO2 96 %. ?Initially required nicardipine drip but now weaned off. ?continue patient on Imdur, hydralazine, labetalol, Coreg, along with as needed labetalol ?Added losartan 25 mg p.o. daily today for better blood pressure control ? ? ?HFrEF (heart failure with reduced  ejection fraction) ([place]) ?EF 40 to 45% during echo in February 2023 ?Continue patient on Coreg, Imdur, labetalol, hydralazine and losartan ?cardiology is following ? ?Chest pain ?None while here.  Monitor on telemetry-ejection fraction 40 to 45% on last cath ? ? ?Coronary artery disease involving native coronary artery of native heart without angina pectoris ?Cardiology following.  Continue Coreg and Imdur, aspirin.  He denies any anginal symptoms ? ? ?Type 2 diabetes mellitus with hyperlipidemia ([place]) ?Glycemic protocol per every 6 hours Accu-Chek.  diabetic nurse following. ?Increase Levemir to 8 units subcu daily and add NovoLog 2 units subcu with each meals.  Continue sliding scale ? ?ESRD on hemodialysis ([place]) ?HD per nephrology ? ?History of DVT (deep vein thrombosis) ?Patient is on Eliquis for his history of DVT in December 2022 ?Continue Eliquis ? ? ? ? ?  ? ?Subjective: Pt seen in dialysis today.  Reports ongoing severe pain from the left shoulder down to distal forearm, in addition to numbness in the finger tips.  No active chest pain.  No current other acute complaints, just expresses frustration about persistent pain. ? ?Physical Exam: ?Vitals:  ? 10/05/21 1315 10/05/21 1322 10/05/21 1330 10/05/21 1337  ?BP: (!) 202/102 (!) 207/95 (!) 203/94   ?Pulse: 75 78 82   ?Resp: (!) 25 20 14    ?Temp:    98.5 ?F (36.9 ?C)  ?TempSrc:    Oral  ?SpO2: 99% 99% 98%   ?Weight:    66.1 kg  ?Height:      ? ?General exam: awake, alert, no acute distress, chronically ill-appearing ?HEENT: atraumatic, clear conjunctiva, anicteric sclera,  moist mucus membranes, hearing grossly normal  ?Respiratory system: normal respiratory effort, on room air. ?Cardiovascular system: RRR, no lower extremity edema, diffuse left upper extremity edema.   ?Central nervous system: A&O x3. no gross focal neurologic deficits, normal speech ?Extremities: Left upper extremity with diffuse edema, pain on palpation of the anterior shoulder, proximal  upper arm down to the distal forearm, normal tone, distal left upper extremity is warm on palpation and without cyanosis ?Skin: dry, intact, normal temperature ?Psychiatry: normal mood, congruent affect, judgement and insight appear normal ? ? ?Data Reviewed: ? ?Notable labs: Hemoglobin 10.0.  CBGs controlled. ? ?Family Communication: None at bedside, seen in dialysis ? ?Disposition: ?Status is: Inpatient ?Remains inpatient appropriate because: Ongoing evaluation of persistent left upper extremity pain and swelling not appropriate for the outpatient setting ? ? Planned Discharge Destination: Skilled nursing facility ? ? ? ?Time spent: 35 minutes ? ?Author: ?Ezekiel Slocumb, DO ?10/05/2021 2:00 PM ? ?For on call review [email or web address].  ?

## 2021-10-05 NOTE — Progress Notes (Signed)
[facility] ?[place], [name] ?10/05/21 ? ?Subjective:  ? ?LOS: 4 ?Patient seen and evaluated during dialysis ?  ?HEMODIALYSIS FLOWSHEET: ? ?Blood Flow Rate (mL/min): 400 mL/min ?Arterial Pressure (mmHg): -160 mmHg ?Venous Pressure (mmHg): 140 mmHg ?Transmembrane Pressure (mmHg): 40 mmHg ?Ultrafiltration Rate (mL/min): 340 mL/min ?Dialysate Flow Rate (mL/min): 600 ml/min ?Conductivity: Machine : 13.7 ?Conductivity: Machine : 13.7 ?Dialysis Fluid Bolus: Normal Saline ?Bolus Amount (mL): 250 mL ? ?Continues to complain of left shoulder pain and discomfort ?Requesting medication ? ? ?Objective:  ?Vital signs in last 24 hours:  ?Temp:  [98.3 ?F (36.8 ?C)-98.9 ?F (37.2 ?C)] 98.8 ?F (37.1 ?C) (04/19 0454) ?Pulse Rate:  [78-87] 80 (04/19 1100) ?Resp:  [12-27] 22 (04/19 1100) ?BP: (166-190)/(84-120) 181/94 (04/19 1100) ?SpO2:  [96 %-99 %] 99 % (04/19 1100) ?Weight:  [66.9 kg] 66.9 kg (04/19 0949) ? ?Weight change:  ?Filed Weights  ? 10/03/21 1332 10/03/21 1900 10/05/21 0949  ?Weight: 68.2 kg 65.9 kg 66.9 kg  ? ? ?Intake/Output: ?  ? ?Intake/Output Summary (Last 24 hours) at 10/05/2021 1107 ?Last data filed at 10/04/2021 2100 ?Gross per 24 hour  ?Intake 0 ml  ?Output 0 ml  ?Net 0 ml  ? ? ? ?Physical Exam: ?General: NAD, laying in bed  ?HEENT  anicteric, moist oral mucous membrane  ?Pulm/lungs  normal breathing effort, lungs are clear to auscultation  ?CVS/Heart  regular rhythm, no rub or gallop  ?Abdomen:   Soft, nontender  ?Extremities:  No peripheral edema, left hand warm  ?Neurologic:  Alert, oriented, able to follow commands  ?Skin:  No acute rashes  ?Right IJ tunneled dialysis catheter ? ? ? ?Basic Metabolic Panel: ? ?Recent Labs  ?Lab 09/29/21 ?1216 10/01/21 ?1937 10/03/21 ?0506  ?NA 139 133* 135  ?K 4.4 4.4 4.3  ?CL 102 96* 99  ?CO2  --  28 25  ?GLUCOSE 240* 515* 241*  ?BUN 32* 45* 58*  ?CREATININE 3.90* 4.06* 5.37*  ?CALCIUM  --  9.0 8.8*  ? ? ? ? ?CBC: ?Recent Labs  ?Lab 09/29/21 ?1216  10/01/21 ?1937 10/03/21 ?0506  ?WBC  --  5.4 6.7  ?NEUTROABS  --  4.1  --   ?HGB 10.9* 10.2* 11.1*  ?HCT 32.0* 31.6* 33.8*  ?MCV  --  88.3 88.5  ?PLT  --  176 176  ? ? ? ?  ?Lab Results  ?Component Value Date  ? HEPBSAG NON REACTIVE 08/08/2021  ? HEPBSAB NON REACTIVE 08/08/2021  ? ? ? ? ?Microbiology: ? ?Recent Results (from the past 240 hour(s))  ?Culture, blood (Routine X 2) w Reflex to ID Panel     Status: None (Preliminary result)  ? Collection Time: 10/01/21 10:29 PM  ? Specimen: BLOOD  ?Result Value Ref Range Status  ? Specimen Description BLOOD BLOOD RIGHT ARM  Final  ? Special Requests   Final  ?  BOTTLES DRAWN AEROBIC AND ANAEROBIC Blood Culture adequate volume  ? Culture   Final  ?  NO GROWTH 4 DAYS ?Performed at [facility], [address] [place], [place] [address] ?  ? Report Status PENDING  Incomplete  ?MRSA Next Gen by PCR, Nasal     Status: None  ? Collection Time: 10/02/21  1:04 AM  ? Specimen: Nasal Mucosa; Nasal Swab  ?Result Value Ref Range Status  ? MRSA by PCR Next Gen NOT DETECTED NOT DETECTED Final  ?  Comment: (NOTE) ?The GeneXpert MRSA Assay (FDA approved for NASAL specimens only), ?is one component of  a comprehensive MRSA colonization surveillance ?program. It is not intended to diagnose MRSA infection nor to guide ?or monitor treatment for MRSA infections. ?Test performance is not FDA approved in patients less than 2 years ?old. ?Performed at [facility], [place], ?[name] [address] ?  ?Culture, blood (Routine X 2) w Reflex to ID Panel     Status: None (Preliminary result)  ? Collection Time: 10/02/21 10:28 PM  ? Specimen: BLOOD  ?Result Value Ref Range Status  ? Specimen Description BLOOD RIGHT HAND  Final  ? Special Requests   Final  ?  BOTTLES DRAWN AEROBIC AND ANAEROBIC Blood Culture adequate volume  ? Culture   Final  ?  NO GROWTH 3 DAYS ?Performed at [facility], [address], [place], [place] [address] ?  ? Report Status PENDING   Incomplete  ? ? ?Coagulation Studies: ?No results for input(s): LABPROT, INR in the last 72 hours. ? ?Urinalysis: ?No results for input(s): COLORURINE, LABSPEC, [place], GLUCOSEU, HGBUR, BILIRUBINUR, KETONESUR, PROTEINUR, UROBILINOGEN, NITRITE, LEUKOCYTESUR in the last 72 hours. ? ?Invalid input(s): APPERANCEUR  ? ? ?Imaging: ?MR BRAIN WO CONTRAST ? ?Result Date: 10/05/2021 ?CLINICAL DATA:  Altered mental status EXAM: MRI HEAD WITHOUT CONTRAST TECHNIQUE: Multiplanar, multiecho pulse sequences of the brain and surrounding structures were obtained without intravenous contrast. COMPARISON:  None. FINDINGS: Brain: No acute infarct, mass effect or extra-axial collection. No acute or chronic hemorrhage. There is multifocal hyperintense T2-weighted signal within the white matter. Parenchymal volume and CSF spaces are normal. The midline structures are normal. Vascular: Major flow voids are preserved. Skull and upper cervical spine: Normal calvarium and skull base. Visualized upper cervical spine and soft tissues are normal. Sinuses/Orbits:No paranasal sinus fluid levels or advanced mucosal thickening. No mastoid or middle ear effusion. Normal orbits. IMPRESSION: 1. No acute intracranial abnormality. 2. Findings of chronic microvascular ischemia. Electronically Signed   By: Ulyses Jarred M.D.   On: 10/05/2021 00:08  ? ?[place] UPPER EXTREMITY DUPLEX LEFT (NON-WBI) ? ?Result Date: 10/04/2021 ?CLINICAL DATA:  Right upper extremity ischemia. Radiocephalic arteriovenous fistula recently created. EXAM: RIGHT UPPER EXTREMITY ARTERIAL DUPLEX SCAN TECHNIQUE: Gray-scale sonography as well as color Doppler and duplex ultrasound was performed to evaluate the arteries of the upper extremity. COMPARISON:  None. FINDINGS: Normal triphasic arterial waveforms visualized within the left subclavian, axillary, brachial, radial and ulnar arteries. Peak systolic velocities are relatively preserved throughout except for the distal radial artery at the  wrist were they are lower at 54 centimeters/second. IMPRESSION: 1. No evidence of focal arterial occlusion or stenosis. 2. Slightly lower peak systolic velocity in the distal radial artery at the wrist. Electronically Signed   By: Jacqulynn Cadet M.D.   On: 10/04/2021 14:36  ? ?DG Shoulder Left ? ?Result Date: 10/04/2021 ?CLINICAL DATA:  Left shoulder pain EXAM: LEFT SHOULDER - 2+ VIEW COMPARISON:  None. FINDINGS: No fracture or dislocation is seen. Degenerative changes are noted with bony spurs in the inferior aspect of shoulder joint and in the left AC joint. No abnormal soft tissue calcifications are seen. In the AP view, there is metallic density superimposed over the lateral cortex of mid shaft of left humerus. In the CT done on 84/13/2440, the metallic density appears to be lying in the soft tissues anterior to the shaft of left humerus. IMPRESSION: No fracture or dislocation is seen. There are no abnormal soft tissue calcifications adjacent to the proximal left humerus. Degenerative changes are noted with bony spurs in the left  shoulder and left AC joints. Electronically Signed   By: Elmer Picker M.D.   On: 10/04/2021 11:23   ? ? ?Medications:  ? ? ? ? apixaban  5 mg Oral BID  ? carvedilol  25 mg Oral BID  ? Chlorhexidine Gluconate Cloth  6 each Topical Daily  ? epoetin (EPOGEN/PROCRIT) injection  4,000 Units Intravenous Q M,W,F-HD  ? ezetimibe  10 mg Oral Daily  ? hydrALAZINE  100 mg Oral Q8H  ? insulin aspart  0-9 Units Subcutaneous Q4H  ? insulin aspart  2 Units Subcutaneous TID WC  ? insulin detemir  8 Units Subcutaneous Daily  ? isosorbide mononitrate  30 mg Oral Daily  ? losartan  25 mg Oral QHS  ? polyethylene glycol  17 g Oral Daily  ? rosuvastatin  10 mg Oral Daily  ? senna-docusate  2 tablet Oral BID  ? ?acetaminophen, albuterol, diphenhydrAMINE, heparin, HYDROcodone-acetaminophen, labetalol, morphine injection, nitroGLYCERIN, nitroGLYCERIN, ondansetron (ZOFRAN) IV ? ?Assessment/ Plan:  ?61  y.o. male with  was admitted on 10/01/2021 for ? ?Principal Problem: ?  Diffuse pain in left upper extremity ?Active Problems: ?  Hypertensive emergency ?  Type 2 diabetes mellitus with hyperlipidemia ([place]) ?  HFrE

## 2021-10-05 NOTE — Progress Notes (Signed)
Hemodialysis Post Treatment Note: ? ?Tx date:10/05/2021 ?Tx time: 2 hours and 38 minutes ?Access:right cvc ?UF Removed: 179ml ? ?Note: ?HD not completed with 22 minutes remaining treatment time. Pt complained of having chest pain at 13:00  with a PS of 3. BFR lowered to 350. NP shantelle notified with order to give 270ml of saline. ? ?13:32 patient wants to go use the bathroom and wanted to stop treatment. NP shantelle ordered to terminate treatment.  ? ?Post HD treatment, pt still complained of chest pain PS of 4. NP shantelle again notified and okay to send back to his room. Primary nurse made aware.  ? ? ? ? ? ? ? ?  ?

## 2021-10-05 NOTE — Progress Notes (Signed)
?   10/05/21 1541  ?Assess: MEWS Score  ?Temp 98.3 ?F (36.8 ?C)  ?BP (!) 170/83  ?Pulse Rate 76  ?Resp 20  ?SpO2 98 %  ?O2 Device Room Air  ?Assess: MEWS Score  ?MEWS Temp 0  ?MEWS Systolic 0  ?MEWS Pulse 0  ?MEWS RR 0  ?MEWS LOC 0  ?MEWS Score 0  ?MEWS Score Color Green  ?Document  ?Patient Outcome Stabilized after interventions  ?Assess: SIRS CRITERIA  ?SIRS Temperature  0  ?SIRS Pulse 0  ?SIRS Respirations  0  ?SIRS WBC 0  ?SIRS Score Sum  0  ? ? ?

## 2021-10-05 NOTE — Progress Notes (Signed)
?   10/05/21 1433  ?Assess: MEWS Score  ?Temp 98.3 ?F (36.8 ?C)  ?BP (!) 210/92  ?Pulse Rate 80  ?ECG Heart Rate 80  ?Resp 20  ?Level of Consciousness Alert  ?SpO2 98 %  ?O2 Device Room Air  ?Patient Activity (if Appropriate) In bed  ?Assess: MEWS Score  ?MEWS Temp 0  ?MEWS Systolic 2  ?MEWS Pulse 0  ?MEWS RR 0  ?MEWS LOC 0  ?MEWS Score 2  ?MEWS Score Color Yellow  ?Assess: if the MEWS score is Yellow or Red  ?Were vital signs taken at a resting state? Yes  ?Focused Assessment No change from prior assessment  ?Does the patient meet 2 or more of the SIRS criteria? No  ?MEWS guidelines implemented *See Row Information* Yes  ?Treat  ?MEWS Interventions Administered scheduled meds/treatments  ?Pain Scale 0-10  ?Pain Score 6  ?Pain Location Arm  ?Pain Orientation Left  ?Take Vital Signs  ?Increase Vital Sign Frequency  Yellow: Q 2hr X 2 then Q 4hr X 2, if remains yellow, continue Q 4hrs  ?Escalate  ?MEWS: Escalate Yellow: discuss with charge nurse/RN and consider discussing with provider and RRT  ?Notify: Charge Nurse/RN  ?Name of Charge Nurse/RN Notified [place]  ?Date Charge Nurse/RN Notified 10/05/21  ?Time Charge Nurse/RN Notified 1440  ?Assess: SIRS CRITERIA  ?SIRS Temperature  0  ?SIRS Pulse 0  ?SIRS Respirations  0  ?SIRS WBC 0  ?SIRS Score Sum  0  ? ? ?

## 2021-10-05 NOTE — Progress Notes (Signed)
PT Cancellation Note ? ?Patient Details ?Name: Jimmy Olson ?MRN: [id number] ?DOB: 03/16/1961 ? ? ?Cancelled Treatment:    Reason Eval/Treat Not Completed: Other (comment) ?Pt out of room for dialysis, after PM chart review noted it was ended early due to chest pain.  Spoke with nurse who reports he is not feeling great right now and going to be going down for an MRI soon.  Suggests holding PT services today, will maintain on caseload and continue to check in as appropriate. ? ?Kreg Shropshire, DPT ?10/05/2021, 4:13 PM ?

## 2021-10-05 NOTE — TOC Progression Note (Signed)
Transition of Care (TOC) - Progression Note  ? ? ?Patient Details  ?Name: Jimmy Olson ?MRN: [id number] ?Date of Birth: [birth year] ? ?Transition of Care (TOC) CM/SW Contact  ?Laurena Slimmer, RN ?Phone Number: ?10/05/2021, 4:15 PM ? ?Clinical Narrative:    ? Patient agreeable to SNF per therapy recommendation. Did not have a preference of facility. PASSR obtained FL2 completed. Bed search initiated. ? ? ?Expected Discharge Plan: Home/Self Care ?Barriers to Discharge: Continued Medical Work up ? ?Expected Discharge Plan and Services ?Expected Discharge Plan: Home/Self Care ?  ?  ?Post Acute Care Choice: NA ?Living arrangements for the past 2 months: [place] ?                ?  ?  ?  ?  ?  ?  ?  ?  ?  ?  ? ? ?Social Determinants of Health (SDOH) Interventions ?  ? ?Readmission Risk Interventions ? ?  10/03/2021  ?  2:32 PM 08/08/2021  ?  8:49 AM 06/01/2021  ?  2:05 PM  ?Readmission Risk Prevention Plan  ?Transportation Screening Complete Complete Complete  ?PCP or Specialist Appt within 3-5 Days Complete Complete Complete  ?[place] or Home Care Consult  Complete Complete  ?Social Work Consult for Center Planning/Counseling Complete Complete Complete  ?Palliative Care Screening Not Applicable Not Applicable Not Applicable  ?Medication Review Press photographer) Complete Complete Referral to Pharmacy  ? ? ?

## 2021-10-05 NOTE — Assessment & Plan Note (Addendum)
Still has weakness, swelling and severe pain.  Left upper extremity Doppler negative for DVT.  Carotid Dopplers showing no hemodynamically significant stenosis.  Vascular surgery recommends neurology, PT OT and pain control.  A lot of his symptoms has worsened since AV fistula placement on 4/13.  He did have some residual weakness from prior stroke in the same area ? ?Neurology recommended MRI of the brain which was negative for stroke.   ? ?MRI left upper extremity on 4/19: Showed severe diffuse subcutaneous soft tissue swelling from the distal left upper arm through the forearm, can be seen in cellulitis or lymphedema.  Distention of the olecranon bursa measuring 2.0X 1.0 cm.  No other well-defined or drainable fluid collections seen.  Nonspecific perimuscular intramuscular edema along the left rotator cuff musculature, anterior deltoid, left trapezius lower fibers.  Nonspecific mild interfascial edema within the muscular compartments of the distal forearm.  No osseous abnormalities. ? ?CK checked and within normal limits ? ?-- Continue supportive care with analgesics as needed ?-- Stop ibuprofen given new onset abdominal pain since this was started ? ? ?

## 2021-10-05 NOTE — [place] FL2 (Signed)
?[place] MEDICAID FL2 LEVEL OF CARE SCREENING TOOL  ?  ? ?IDENTIFICATION  ?Patient Name: ?Jimmy Olson Birthdate: [birth year] Sex: male Admission Date (Current Location): ?10/01/2021  ?[place] and [place] Number: ? [place] ?  Facility and Address:  ?[facility], [address], [place], [place] [address] ?     Provider Number: ?[id number]  ?Attending Physician Name and Address:  ?Ezekiel Slocumb, DO ? Relative Name and Phone Number:  ?[place] ?   ?Current Level of Care: ?Hospital Recommended Level of Care: ?[place] Prior Approval Number: ?  ? ?Date Approved/Denied: ?  PASRR Number: ?[id number] ? ?Discharge Plan: ?SNF ?  ? ?Current Diagnoses: ?Patient Active Problem List  ? Diagnosis Date Noted  ? Left upper extremity swelling 10/05/2021  ? Steal syndrome as complication of dialysis access [facility]) 10/04/2021  ? Chest pain 10/02/2021  ? Diffuse pain in left upper extremity 10/01/2021  ? Sepsis ([place]) 08/29/2021  ? Generalized weakness 08/12/2021  ? NSTEMI (non-ST elevated myocardial infarction) ([place]) 08/08/2021  ? History of DVT (deep vein thrombosis) 08/08/2021  ? Chronic anticoagulation 08/08/2021  ? Carotid stenosis 08/03/2021  ? ESRD on hemodialysis ([place]) 07/14/2021  ? TIA (transient ischemic attack) 07/14/2021  ? Acute deep vein thrombosis (DVT) of axillary vein (HCC) - left. diagnosed 05-2021 07/14/2021  ? History of anemia due to CKD 06/01/2021  ? Acute on chronic combined systolic and diastolic CHF (congestive heart failure) ([place]) 12/23/2020  ? Hyperlipidemia associated with type 2 diabetes mellitus ([place]) 12/23/2020  ? Coronary artery disease involving native coronary artery of native heart without angina pectoris   ? Demand ischemia ([place])   ? Severe recurrent major depression without psychotic features ([place]) 07/16/2020  ? Uncontrolled type 2 diabetes mellitus with hyperglycemia, with long-term current use of insulin ([place]) 07/15/2020  ? Acute on chronic  HFrEF (heart failure with reduced ejection fraction) ([place]) 07/15/2020  ? HLD (hyperlipidemia) 07/15/2020  ? COVID-19 virus infection 02/2020  ? Anemia of chronic kidney failure, stage 5 ([place]) 02/2020  ? HFrEF (heart failure with reduced ejection fraction) ([place])   ? Hypertension associated with diabetes ([place])   ? Hypertensive emergency 09/11/2019  ? Type 2 diabetes mellitus with hyperlipidemia ([place]) 09/11/2019  ? ? ?Orientation RESPIRATION BLADDER Height & Weight   ?  ?Self, Time, Situation, Place ? Normal Continent Weight: 66.1 kg ?Height:  5\' 6"  (167.6 cm)  ?BEHAVIORAL SYMPTOMS/MOOD NEUROLOGICAL BOWEL NUTRITION STATUS  ?    Continent Diet (Diet 2 gram sodium)  ?AMBULATORY STATUS COMMUNICATION OF NEEDS Skin   ?Limited Assist Verbally Normal ?  ?  ?  ?    ?     ?     ? ? ?Personal Care Assistance Level of Assistance  ?Bathing, Dressing Bathing Assistance: Limited assistance ?  ?Dressing Assistance: Limited assistance ?   ? ?Functional Limitations Info  ?    ?  ?   ? ? ?SPECIAL CARE FACTORS FREQUENCY  ?    ?  ?  ?  ?  ?  ?  ?   ? ? ?Contractures Contractures Info: Not present  ? ? ?Additional Factors Info  ?Code Status, Allergies Code Status Info: FULL ?Allergies Info: No Known Allergies ?  ?  ?  ?   ? ?Current Medications (10/05/2021):  This is the current hospital active medication list ?Current Facility-Administered Medications  ?Medication Dose Route Frequency Provider Last Rate Last Admin  ? acetaminophen (TYLENOL) tablet 650 mg  650 mg Oral  Q4H PRN Para Skeans, MD      ? albuterol (PROVENTIL) (2.5 MG/3ML) 0.083% nebulizer solution 3 mL  3 mL Inhalation Q6H PRN Para Skeans, MD      ? apixaban Arne Cleveland) tablet 5 mg  5 mg Oral BID Max Sane, MD   5 mg at 10/05/21 1439  ? carvedilol (COREG) tablet 25 mg  25 mg Oral BID Lucrezia Starch, MD   25 mg at 10/05/21 1439  ? Chlorhexidine Gluconate Cloth 2 % PADS 6 each  6 each Topical Daily Max Sane, MD   6 each at 10/05/21 1435  ? diphenhydrAMINE (BENADRYL)  capsule 25 mg  25 mg Oral Q6H PRN Sharion Settler, NP   25 mg at 10/02/21 0048  ? epoetin alfa (EPOGEN) injection 4,000 Units  4,000 Units Intravenous Q M,W,F-HD Murlean Iba, MD   4,000 Units at 10/05/21 1056  ? ezetimibe (ZETIA) tablet 10 mg  10 mg Oral Daily Para Skeans, MD   10 mg at 10/05/21 1433  ? hydrALAZINE (APRESOLINE) tablet 100 mg  100 mg Oral Q8H Max Sane, MD   100 mg at 10/05/21 1433  ? HYDROcodone-acetaminophen (NORCO/VICODIN) 5-325 MG per tablet 1 tablet  1 tablet Oral Q6H PRN Max Sane, MD   1 tablet at 10/05/21 1439  ? ibuprofen (ADVIL) tablet 400 mg  400 mg Oral BID Colon Flattery, NP   400 mg at 10/05/21 1433  ? insulin aspart (novoLOG) injection 0-9 Units  0-9 Units Subcutaneous Q4H Foust, Katy L, NP   2 Units at 10/05/21 1440  ? insulin aspart (novoLOG) injection 2 Units  2 Units Subcutaneous TID WC Max Sane, MD   2 Units at 10/05/21 1440  ? insulin detemir (LEVEMIR) injection 8 Units  8 Units Subcutaneous Daily Max Sane, MD   8 Units at 10/05/21 1435  ? isosorbide mononitrate (IMDUR) 24 hr tablet 30 mg  30 mg Oral Daily Florina Ou V, MD   30 mg at 10/05/21 1433  ? labetalol (NORMODYNE) injection 10 mg  10 mg Intravenous Q2H PRN Max Sane, MD      ? losartan (COZAAR) tablet 50 mg  50 mg Oral QHS Nicole Kindred A, DO      ? morphine (PF) 2 MG/ML injection 2 mg  2 mg Intravenous Q3H PRN Para Skeans, MD   2 mg at 10/05/21 1035  ? nitroGLYCERIN (NITROGLYN) 2 % ointment 1 inch  1 inch Topical Q6H PRN Minna Merritts, MD   1 inch at 10/02/21 1152  ? nitroGLYCERIN (NITROSTAT) SL tablet 0.4 mg  0.4 mg Sublingual Q5 min PRN Para Skeans, MD   0.4 mg at 10/01/21 2130  ? ondansetron (ZOFRAN) injection 4 mg  4 mg Intravenous Q6H PRN Para Skeans, MD   4 mg at 10/03/21 0418  ? polyethylene glycol (MIRALAX / GLYCOLAX) packet 17 g  17 g Oral Daily Max Sane, MD   17 g at 10/05/21 1440  ? rosuvastatin (CRESTOR) tablet 10 mg  10 mg Oral Daily Max Sane, MD   10 mg at 10/05/21  1433  ? senna-docusate (Senokot-S) tablet 2 tablet  2 tablet Oral BID Max Sane, MD   2 tablet at 10/05/21 1439  ? ? ? ?Discharge Medications: ?Please see discharge summary for a list of discharge medications. ? ?Relevant Imaging Results: ? ?Relevant Lab Results: ? ? ?Additional Information ?[id number] ? ?Laurena Slimmer, RN ? ? ? ? ?

## 2021-10-06 DIAGNOSIS — F32A Depression, unspecified: Secondary | ICD-10-CM | POA: Clinically undetermined

## 2021-10-06 DIAGNOSIS — M79602 Pain in left arm: Secondary | ICD-10-CM | POA: Diagnosis not present

## 2021-10-06 DIAGNOSIS — K30 Functional dyspepsia: Secondary | ICD-10-CM | POA: Clinically undetermined

## 2021-10-06 LAB — CBC
HCT: 31.1 % — ABNORMAL LOW (ref 39.0–52.0)
Hemoglobin: 10.3 g/dL — ABNORMAL LOW (ref 13.0–17.0)
MCH: 28.4 pg (ref 26.0–34.0)
MCHC: 33.1 g/dL (ref 30.0–36.0)
MCV: 85.7 fL (ref 80.0–100.0)
Platelets: 189 10*3/uL (ref 150–400)
RBC: 3.63 MIL/uL — ABNORMAL LOW (ref 4.22–5.81)
RDW: 13.5 % (ref 11.5–15.5)
WBC: 5.2 10*3/uL (ref 4.0–10.5)
nRBC: 0 % (ref 0.0–0.2)

## 2021-10-06 LAB — GLUCOSE, CAPILLARY
Glucose-Capillary: 119 mg/dL — ABNORMAL HIGH (ref 70–99)
Glucose-Capillary: 127 mg/dL — ABNORMAL HIGH (ref 70–99)
Glucose-Capillary: 159 mg/dL — ABNORMAL HIGH (ref 70–99)
Glucose-Capillary: 164 mg/dL — ABNORMAL HIGH (ref 70–99)
Glucose-Capillary: 227 mg/dL — ABNORMAL HIGH (ref 70–99)
Glucose-Capillary: 95 mg/dL (ref 70–99)

## 2021-10-06 LAB — RENAL FUNCTION PANEL
Albumin: 2.7 g/dL — ABNORMAL LOW (ref 3.5–5.0)
Anion gap: 7 (ref 5–15)
BUN: 33 mg/dL — ABNORMAL HIGH (ref 6–20)
CO2: 28 mmol/L (ref 22–32)
Calcium: 8 mg/dL — ABNORMAL LOW (ref 8.9–10.3)
Chloride: 100 mmol/L (ref 98–111)
Creatinine, Ser: 4.61 mg/dL — ABNORMAL HIGH (ref 0.61–1.24)
GFR, Estimated: 14 mL/min — ABNORMAL LOW (ref >60)
Glucose, Bld: 167 mg/dL — ABNORMAL HIGH (ref 70–99)
Phosphorus: 4.7 mg/dL — ABNORMAL HIGH (ref 2.5–4.6)
Potassium: 4.4 mmol/L (ref 3.5–5.1)
Sodium: 135 mmol/L (ref 135–145)

## 2021-10-06 LAB — CULTURE, BLOOD (ROUTINE X 2)
Culture: NO GROWTH
Special Requests: ADEQUATE

## 2021-10-06 LAB — HEPATITIS B SURFACE ANTIBODY, QUANTITATIVE: Hep B S AB Quant (Post): <3.1 m[IU]/mL — ABNORMAL LOW (ref >9.9)

## 2021-10-06 LAB — CK: Total CK: 65 U/L (ref 49–397)

## 2021-10-06 MED ORDER — INSULIN ASPART 100 UNIT/ML IJ SOLN
5.0000 [IU] | Freq: Three times a day (TID) | INTRAMUSCULAR | Status: DC
  Administered 2021-10-06 – 2021-10-07 (×3): 5 [IU] via SUBCUTANEOUS
  Filled 2021-10-06 (×3): qty 1

## 2021-10-06 MED ORDER — ESCITALOPRAM OXALATE 10 MG PO TABS
5.0000 mg | ORAL_TABLET | Freq: Every day | ORAL | Status: DC
  Administered 2021-10-06 – 2021-10-07 (×2): 5 mg via ORAL
  Filled 2021-10-06 (×2): qty 1

## 2021-10-06 MED ORDER — INSULIN DETEMIR 100 UNIT/ML ~~LOC~~ SOLN
12.0000 [IU] | Freq: Every day | SUBCUTANEOUS | Status: DC
  Administered 2021-10-06 – 2021-10-08 (×3): 12 [IU] via SUBCUTANEOUS
  Filled 2021-10-06 (×3): qty 0.12

## 2021-10-06 MED ORDER — ALUM & MAG HYDROXIDE-SIMETH 200-200-20 MG/5ML PO SUSP
30.0000 mL | Freq: Four times a day (QID) | ORAL | Status: DC | PRN
  Administered 2021-10-06 – 2021-10-08 (×4): 30 mL via ORAL
  Filled 2021-10-06 (×4): qty 30

## 2021-10-06 NOTE — Assessment & Plan Note (Signed)
Attributed to medications being given all at once on empty stomach. ?Continue as needed antiemetic, trial of Maalox ?Okay to spread out medications and provide snacks in between meals ?

## 2021-10-06 NOTE — Progress Notes (Addendum)
[facility] ?[place], [name] ?10/06/21 ? ?Subjective:  ? ?LOS: 5 ? ?Patient laying in bed ?Alert and oriented ?Reports improved left shoulder pain ?States left hand remains numb but he is able to grasp objects ?Tolerating meals ?Becomes tearful when voicing family concerns and recent marriage separation.  ? ?Objective:  ?Vital signs in last 24 hours:  ?Temp:  [98.1 ?F (36.7 ?C)-98.7 ?F (37.1 ?C)] 98.6 ?F (37 ?C) (04/20 0818) ?Pulse Rate:  [75-84] 75 (04/20 0818) ?Resp:  [14-27] 18 (04/20 0818) ?BP: (143-210)/(75-102) 154/79 (04/20 0818) ?SpO2:  [95 %-100 %] 97 % (04/20 0818) ?Weight:  [66.1 kg] 66.1 kg (04/19 1337) ? ?Weight change:  ?Filed Weights  ? 10/03/21 1900 10/05/21 0949 10/05/21 1337  ?Weight: 65.9 kg 66.9 kg 66.1 kg  ? ? ?Intake/Output: ?  ? ?Intake/Output Summary (Last 24 hours) at 10/06/2021 1244 ?Last data filed at 10/06/2021 1041 ?Gross per 24 hour  ?Intake 240 ml  ?Output 515 ml  ?Net -275 ml  ? ? ? ?Physical Exam: ?General: NAD, laying in bed  ?HEENT  anicteric, moist oral mucous membrane  ?Pulm/lungs  normal breathing effort, lungs are clear to auscultation  ?CVS/Heart  regular rhythm, no rub or gallop  ?Abdomen:   Soft, nontender  ?Extremities:  No peripheral edema, left hand warm, non pitting left arm edema  ?Neurologic:  Alert, oriented, able to follow commands  ?Skin:  No acute rashes  ?Right IJ tunneled dialysis catheter ? ? ? ?Basic Metabolic Panel: ? ?Recent Labs  ?Lab 10/01/21 ?1937 10/03/21 ?0506 10/06/21 ?0086  ?NA 133* 135 135  ?K 4.4 4.3 4.4  ?CL 96* 99 100  ?CO2 28 25 28   ?GLUCOSE 515* 241* 167*  ?BUN 45* 58* 33*  ?CREATININE 4.06* 5.37* 4.61*  ?CALCIUM 9.0 8.8* 8.0*  ?PHOS  --   --  4.7*  ? ? ? ? ?CBC: ?Recent Labs  ?Lab 10/01/21 ?1937 10/03/21 ?0506 10/05/21 ?1127 10/06/21 ?7619  ?WBC 5.4 6.7 5.7 5.2  ?NEUTROABS 4.1  --   --   --   ?HGB 10.2* 11.1* 10.0* 10.3*  ?HCT 31.6* 33.8* 30.8* 31.1*  ?MCV 88.3 88.5 88.3 85.7  ?PLT 176 176 163 189  ? ? ? ?  ?Lab Results   ?Component Value Date  ? HEPBSAG NON REACTIVE 10/05/2021  ? HEPBSAB NON REACTIVE 10/05/2021  ? ? ? ? ?Microbiology: ? ?Recent Results (from the past 240 hour(s))  ?Culture, blood (Routine X 2) w Reflex to ID Panel     Status: None  ? Collection Time: 10/01/21 10:29 PM  ? Specimen: BLOOD  ?Result Value Ref Range Status  ? Specimen Description BLOOD BLOOD RIGHT ARM  Final  ? Special Requests   Final  ?  BOTTLES DRAWN AEROBIC AND ANAEROBIC Blood Culture adequate volume  ? Culture   Final  ?  NO GROWTH 5 DAYS ?Performed at [facility], [address], [place], [place] [address] ?  ? Report Status 10/06/2021 FINAL  Final  ?MRSA Next Gen by PCR, Nasal     Status: None  ? Collection Time: 10/02/21  1:04 AM  ? Specimen: Nasal Mucosa; Nasal Swab  ?Result Value Ref Range Status  ? MRSA by PCR Next Gen NOT DETECTED NOT DETECTED Final  ?  Comment: (NOTE) ?The GeneXpert MRSA Assay (FDA approved for NASAL specimens only), ?is one component of a comprehensive MRSA colonization surveillance ?program. It is not intended to diagnose MRSA infection nor to guide ?or monitor treatment for MRSA infections. ?  Test performance is not FDA approved in patients less than 2 years ?old. ?Performed at [facility], [place], ?[name] [address] ?  ?Culture, blood (Routine X 2) w Reflex to ID Panel     Status: None (Preliminary result)  ? Collection Time: 10/02/21 10:28 PM  ? Specimen: BLOOD  ?Result Value Ref Range Status  ? Specimen Description BLOOD RIGHT HAND  Final  ? Special Requests   Final  ?  BOTTLES DRAWN AEROBIC AND ANAEROBIC Blood Culture adequate volume  ? Culture   Final  ?  NO GROWTH 4 DAYS ?Performed at [facility], [address], [place], [place] [address] ?  ? Report Status PENDING  Incomplete  ? ? ?Coagulation Studies: ?No results for input(s): LABPROT, INR in the last 72 hours. ? ?Urinalysis: ?No results for input(s): COLORURINE, LABSPEC, [place], GLUCOSEU, HGBUR, BILIRUBINUR,  KETONESUR, PROTEINUR, UROBILINOGEN, NITRITE, LEUKOCYTESUR in the last 72 hours. ? ?Invalid input(s): APPERANCEUR  ? ? ?Imaging: ?MR BRAIN WO CONTRAST ? ?Result Date: 10/05/2021 ?CLINICAL DATA:  Altered mental status EXAM: MRI HEAD WITHOUT CONTRAST TECHNIQUE: Multiplanar, multiecho pulse sequences of the brain and surrounding structures were obtained without intravenous contrast. COMPARISON:  None. FINDINGS: Brain: No acute infarct, mass effect or extra-axial collection. No acute or chronic hemorrhage. There is multifocal hyperintense T2-weighted signal within the white matter. Parenchymal volume and CSF spaces are normal. The midline structures are normal. Vascular: Major flow voids are preserved. Skull and upper cervical spine: Normal calvarium and skull base. Visualized upper cervical spine and soft tissues are normal. Sinuses/Orbits:No paranasal sinus fluid levels or advanced mucosal thickening. No mastoid or middle ear effusion. Normal orbits. IMPRESSION: 1. No acute intracranial abnormality. 2. Findings of chronic microvascular ischemia. Electronically Signed   By: Ulyses Jarred M.D.   On: 10/05/2021 00:08  ? ?MR HUMERUS LEFT WO CONTRAST ? ?Result Date: 10/05/2021 ?CLINICAL DATA:  Persistent pain, swelling, numbness in left shoulder down to distal forearm, onset after new AV fistula EXAM: MRI OF THE LEFT HUMERUS WITHOUT CONTRAST; MRI OF THE LEFT FOREARM WITHOUT CONTRAST TECHNIQUE: Multiplanar, multisequence MR imaging of the left humerus and forearm was performed. No intravenous contrast was administered. COMPARISON:  None. FINDINGS: Limited MRI of the forearm (per report, patient refused to continue scan). There is susceptibility artifact along the mid upper arm, which limits evaluation of the bony and soft tissues of this region. Bones/Joint/Cartilage The cortex is intact. There is no significant marrow signal alteration. Ligaments Grossly intact Muscles and Tendons Nonspecific perimuscular and intramuscular  edema along the cuff musculature, anterior deltoid, and left trapezius lower fibers. No intramuscular edema of the upper arm or forearm. No intramuscular collection. There is mild interfascial edema within the muscular compartments of the distal forearm. Soft tissues There is severe diffuse subcutaneous soft tissue swelling from the distal left upper arm through the forearm. There is distension of the olecranon bursa measuring up to 2.0 x 1.0 cm (forearm series 10, image 8). No other well-defined/drainable fluid collection on noncontrast MRI. Mild soft tissue edema along the posterior aspect of the clavicle and in the left axilla. IMPRESSION: Susceptibility artifact along the mid upper arm, limiting evaluation in this region. Limited MRI of the forearm. There is severe diffuse subcutaneous soft tissue swelling from the distal left upper arm through the forearm, can be seen in cellulitis or lymphedema. Distension of the olecranon bursa measuring 2.0 x 1.0 cm. No other well-defined/drainable fluid collection on noncontrast MRI. Nonspecific perimuscular and intramuscular edema  along the left rotator cuff musculature, anterior deltoid, and left trapezius lower fibers. Consider correlation with creatine kinase. Nonspecific mild interfascial edema within the muscular compartments of the distal forearm. No acute osseous abnormality. Electronically Signed   By: Maurine Simmering M.D.   On: 10/05/2021 18:30  ? ?MR FOREARM LEFT WO CONTRAST ? ?Result Date: 10/05/2021 ?CLINICAL DATA:  Persistent pain, swelling, numbness in left shoulder down to distal forearm, onset after new AV fistula EXAM: MRI OF THE LEFT HUMERUS WITHOUT CONTRAST; MRI OF THE LEFT FOREARM WITHOUT CONTRAST TECHNIQUE: Multiplanar, multisequence MR imaging of the left humerus and forearm was performed. No intravenous contrast was administered. COMPARISON:  None. FINDINGS: Limited MRI of the forearm (per report, patient refused to continue scan). There is susceptibility  artifact along the mid upper arm, which limits evaluation of the bony and soft tissues of this region. Bones/Joint/Cartilage The cortex is intact. There is no significant marrow signal alteration. Ligaments Joanie Coddington

## 2021-10-06 NOTE — Progress Notes (Signed)
PT Cancellation Note ? ?Patient Details ?Name: Jimmy Olson ?MRN: [id number] ?DOB: 1961-04-08 ? ? ?Cancelled Treatment:     PT attempt, pt is having OT. PT will return tomorrow and continue to follow per current POC. Will attempt to see prior to HD.  ? ? ?Willette Pa ?10/06/2021, 4:14 PM ?

## 2021-10-06 NOTE — Assessment & Plan Note (Addendum)
Started on Lexapro 5 mg nightly by nephrology.  Patient with several health challenges, recurrent hospital admissions, social issues as well. ?--PCP follow up for tolerance, efficacy, dose changes ?
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
## 2021-10-06 NOTE — Progress Notes (Signed)
Inpatient Diabetes Program Recommendations ? ?AACE/ADA: New Consensus Statement on Inpatient Glycemic Control  ? ?Target Ranges:  Prepandial:   less than 140 mg/dL ?     Peak postprandial:   less than 180 mg/dL (1-2 hours) ?     Critically ill patients:  140 - 180 mg/dL  ? ? Latest Reference Range & Units 10/06/21 03:59  ?Glucose-Capillary 70 - 99 mg/dL 159 (H)  ? ? Latest Reference Range & Units 10/05/21 08:18 10/05/21 14:38 10/05/21 17:32 10/05/21 20:02 10/05/21 23:56  ?Glucose-Capillary 70 - 99 mg/dL 124 (H) 174 (H) 214 (H) 232 (H) 250 (H)  ? ?Review of Glycemic Control ? ?Diabetes history: DM2 ?Outpatient Diabetes medications: Levemir 25 units QHS, Humalog TID per sliding scale ?Current orders for Inpatient glycemic control: Levemir 10 units daily, Novlog 0-9 units Q4H, Novolog 3 units TID with meals ?  ?Inpatient Diabetes Program Recommendations:   ?  ?Insulin: Please consider increasing Levemir to 12 units daily and increasing meal coverage to Novolog 5 units TID with meals if patient eats at least 50% of meals. ?  ?Thanks, ?Barnie Alderman, RN, MSN, CDE ?Diabetes Coordinator ?Inpatient Diabetes Program ?[phone] (Team Pager from 8am to 5pm) ? ? ?

## 2021-10-06 NOTE — Progress Notes (Signed)
Hemodialysis patient known at [facility] MWF 11:45am. ?

## 2021-10-06 NOTE — Progress Notes (Signed)
Occupational Therapy Treatment ?Patient Details ?Name: Jimmy Olson ?MRN: [id number] ?DOB: 1961-02-25 ?Today's Date: 10/06/2021 ? ? ?History of present illness presented to ER secondary to L UE swelling, edema; admitted for management/evaluation of possible STEAL syndrome to L UE (s/p L radialcephalic AV fistula creation 09/29/21).  Hospital course significant for L UE emergent ligation. ?  ?OT comments ? Mr. Olheiser presents with generalized weakness, limited endurance, and pain. He continues to have LUE edema, but reduced considerably over the past 48 hours, with improved ROM of digits on L hand. Instead of pain in hand, he complains of numbness, with limited sensation particularly in thumb and index finger. He does endorse 5/10 pain with movement of L shoulder. Able to achieve ~ 70 degrees of flexion at L elbow. Despite improved ability to form fist, pt is unable to hold even lightweight objects in L hand or to use it to assist in opening containers. Pt states that he has barely slept in past week and his primary goal at present is to be able to get some sleep. Will continue to follow plan of care, with DC to SNF recommended.  ? ?Recommendations for follow up therapy are one component of a multi-disciplinary discharge planning process, led by the attending physician.  Recommendations may be updated based on patient status, additional functional criteria and insurance authorization. ?   ?Follow Up Recommendations ? Skilled nursing-short term rehab (<3 hours/day)  ?  ?Assistance Recommended at Discharge Frequent or constant Supervision/Assistance  ?Patient can return home with the following ? A lot of help with bathing/dressing/bathroom;Help with stairs or ramp for entrance;A little help with walking and/or transfers;Assistance with cooking/housework;Assist for transportation;Assistance with feeding ?  ?Equipment Recommendations ? None recommended by OT  ?  ?Recommendations for Other Services   ? ?  ?Precautions /  Restrictions Precautions ?Precautions: Fall ?Restrictions ?Weight Bearing Restrictions: No  ? ? ?  ? ?Mobility Bed Mobility ?Overal bed mobility: Needs Assistance ?Bed Mobility: Sit to Supine, Supine to Sit ?  ?  ?Supine to sit: Min guard ?Sit to supine: Min guard ?  ?  ?  ? ?Transfers ?Overall transfer level: Needs assistance ?  ?Transfers: Sit to/from Stand ?Sit to Stand: Min guard ?  ?  ?  ?  ?  ?General transfer comment: fatigues quickly ?  ?  ?Balance Overall balance assessment: Needs assistance ?Sitting-balance support: No upper extremity supported, Feet supported ?Sitting balance-Leahy Scale: Good ?  ?  ?Standing balance support: No upper extremity supported ?Standing balance-Leahy Scale: Good ?  ?  ?  ?  ?  ?  ?  ?  ?  ?  ?  ?  ?   ? ?ADL either performed or assessed with clinical judgement  ? ?ADL Overall ADL's : Needs assistance/impaired ?Eating/Feeding: Modified independent ?Eating/Feeding Details (indicate cue type and reason): Mod I with R UE only. Pt unable to open packets, hold utensils or cup with L UE ?Grooming: Modified independent ?Grooming Details (indicate cue type and reason): Mod I with R UE only; unable to hold toothbrush with L UE ?  ?  ?  ?  ?  ?  ?  ?  ?  ?  ?  ?  ?  ?  ?  ?  ?  ? ?Extremity/Trunk Assessment Upper Extremity Assessment ?Upper Extremity Assessment: LUE deficits/detail ?LUE Deficits / Details: does not achieve full fist 2/2 edema/pain, moves digits/wrist 3/5 grossly. 2-/5 at shoulder/elbow ?LUE Sensation: decreased light touch;decreased proprioception ?LUE Coordination: decreased  fine motor;decreased gross motor ?  ?Lower Extremity Assessment ?Lower Extremity Assessment: Overall WFL for tasks assessed ?  ?  ?  ? ?Vision Patient Visual Report: No change from baseline ?  ?  ?Perception   ?  ?Praxis   ?  ? ?Cognition Arousal/Alertness: Awake/alert ?Behavior During Therapy: [facility] for tasks assessed/performed ?Overall Cognitive Status: Within Functional Limits for tasks assessed ?   ?  ?  ?  ?  ?  ?  ?  ?  ?  ?  ?  ?  ?  ?  ?  ?  ?  ?  ?   ?Exercises Other Exercises ?Other Exercises: Educ re: importance of OOB movement, DC recs, LUE positioning for comfort ? ?  ?Shoulder Instructions   ? ? ?  ?General Comments continues to have LUE edema, but much reduced from 2 days previous  ? ? ?Pertinent Vitals/ Pain       Pain Assessment ?Faces Pain Scale: Hurts a little bit ?Pain Location: LUE -- especially shoulder with movement; numbness in hand ?Pain Descriptors / Indicators: Numbness, Aching, Grimacing, Guarding ?Pain Intervention(s): Limited activity within patient's tolerance, Repositioned ? ?Home Living   ?  ?  ?  ?  ?  ?  ?  ?  ?  ?  ?  ?  ?  ?  ?  ?  ?  ?  ? ?  ?Prior Functioning/Environment    ?  ?  ?  ?   ? ?Frequency ? Min 2X/week  ? ? ? ? ?  ?Progress Toward Goals ? ?OT Goals(current goals can now be found in the care plan section) ? Progress towards OT goals: Progressing toward goals ? ?Acute Rehab OT Goals ?Patient Stated Goal: to get some sleep ?OT Goal Formulation: With patient ?Time For Goal Achievement: 10/18/21 ?Potential to Achieve Goals: Good  ?Plan Discharge plan remains appropriate;Frequency remains appropriate   ? ?Co-evaluation ? ? ?   ?  ?  ?  ?  ? ?  ?AM-PAC OT "6 Clicks" Daily Activity     ?Outcome Measure ? ? Help from another person eating meals?: A Little ?Help from another person taking care of personal grooming?: A Little ?Help from another person toileting, which includes using toliet, bedpan, or urinal?: A Little ?Help from another person bathing (including washing, rinsing, drying)?: A Lot ?Help from another person to put on and taking off regular upper body clothing?: A Lot ?Help from another person to put on and taking off regular lower body clothing?: A Lot ?6 Click Score: 15 ? ?  ?End of Session   ? ?OT Visit Diagnosis: Other abnormalities of gait and mobility (R26.89);Muscle weakness (generalized) (M62.81) ?  ?Activity Tolerance Patient tolerated treatment well ?   ?Patient Left in bed;with call bell/phone within reach;with bed alarm set ?  ?Nurse Communication   ?  ? ?   ? ?Time: 1093-2355 ?OT Time Calculation (min): 9 min ? ?Charges: OT General Charges ?$OT Visit: 1 Visit ?OT Treatments ?$Self Care/Home Management : 8-22 mins ? ?Josiah Lobo, PhD, MS, OTR/L ?10/06/21, 4:33 PM ? ?

## 2021-10-06 NOTE — TOC Progression Note (Signed)
Transition of Care (TOC) - Progression Note  ? ? ?Patient Details  ?Name: Jimmy Olson ?MRN: [id number] ?Date of Birth: [birth year] ? ?Transition of Care (TOC) CM/SW Contact  ?Beverly Sessions, RN ?Phone Number: ?10/06/2021, 2:32 PM ? ?Clinical Narrative:    ? ?No bed offers.  Search extended  ? ?Expected Discharge Plan: Home/Self Care ?Barriers to Discharge: Continued Medical Work up ? ?Expected Discharge Plan and Services ?Expected Discharge Plan: Home/Self Care ?  ?  ?Post Acute Care Choice: NA ?Living arrangements for the past 2 months: [place] ?                ?  ?  ?  ?  ?  ?  ?  ?  ?  ?  ? ? ?Social Determinants of Health (SDOH) Interventions ?  ? ?Readmission Risk Interventions ? ?  10/03/2021  ?  2:32 PM 08/08/2021  ?  8:49 AM 06/01/2021  ?  2:05 PM  ?Readmission Risk Prevention Plan  ?Transportation Screening Complete Complete Complete  ?PCP or Specialist Appt within 3-5 Days Complete Complete Complete  ?[place] or Home Care Consult  Complete Complete  ?Social Work Consult for [place] Planning/Counseling Complete Complete Complete  ?Palliative Care Screening Not Applicable Not Applicable Not Applicable  ?Medication Review Press photographer) Complete Complete Referral to Pharmacy  ? ? ?

## 2021-10-06 NOTE — Progress Notes (Signed)
?Progress Note ? ? ?Patient: Jimmy Olson CBS:[id number] DOB: Mar 19, 1961 DOA: 10/01/2021     5 ?DOS: the patient was seen and examined on 10/06/2021 ?  ?Brief hospital course: ?61 y.o. male with medical history significant of diabetes mellitus type 2, end-stage renal disease on CAD, history of DVT presenting with left arm pain swelling and numbness x1 day admitted for left steal syndrome  ? ?4/16: Ligation of left radiocephalic AV fistula ?4/17: Transfer to floor.  Carotid Doppler showed no hemodynamically significant stenosis.  Left upper extremity Doppler negative for DVT ?4/18: Neurology, PT/OT evaluations, MRI of the brain negative for acute stroke ?4/19: dialysis, plan for MRI of Left UE to further evaluate ? ?Assessment and Plan: ?* Diffuse pain in left upper extremity ?Still has weakness, swelling and severe pain.  Left upper extremity Doppler negative for DVT.  Carotid Dopplers showing no hemodynamically significant stenosis.  Vascular surgery recommends neurology, PT OT and pain control.  A lot of his symptoms has worsened since AV fistula placement on 4/13.  He did have some residual weakness from prior stroke in the same area ? ?Neurology recommended MRI of the brain which was negative for stroke.   ? ?MRI left upper extremity on 4/19: Showed severe diffuse subcutaneous soft tissue swelling from the distal left upper arm through the forearm, can be seen in cellulitis or lymphedema.  Distention of the olecranon bursa measuring 2.0X 1.0 cm.  No other well-defined or drainable fluid collections seen.  Nonspecific perimuscular intramuscular edema along the left rotator cuff musculature, anterior deltoid, left trapezius lower fibers.  Nonspecific mild interfascial edema within the muscular compartments of the distal forearm.  No osseous abnormalities. ? ?CK checked and within normal limits ? ?-- Continue supportive care with analgesics as needed, low-dose ibuprofen ? ? ? ?Steal syndrome as complication of  dialysis access [facility]) ?Status post emergent ligation of the AV fistula on 4/16 by vascular ?Patient continues complain of left arm pain, numbness, weakness and swelling ? ?Hypertensive emergency ?Blood pressure (!) 167/88, pulse 78, temperature 98.4 ?F (36.9 ?C), temperature source Oral, resp. rate 18, height 5\' 6"  (1.676 m), weight 65.9 kg, SpO2 96 %. ?Initially required nicardipine drip but now weaned off. ?continue patient on Imdur, hydralazine, labetalol, Coreg, along with as needed labetalol ?Added losartan 25 mg p.o. daily today for better blood pressure control ? ? ?HFrEF (heart failure with reduced ejection fraction) ([place]) ?EF 40 to 45% during echo in February 2023 ?Continue patient on Coreg, Imdur, labetalol, hydralazine and losartan ?cardiology is following ? ?Chest pain ?None while here.  Monitor on telemetry-ejection fraction 40 to 45% on last cath ? ? ?Coronary artery disease involving native coronary artery of native heart without angina pectoris ?Cardiology following.  Continue Coreg and Imdur, aspirin.  He denies any anginal symptoms ? ? ?Type 2 diabetes mellitus with hyperlipidemia ([place]) ?Glycemic protocol per every 6 hours Accu-Chek.  diabetic nurse following. ?Increase Levemir to 8 units subcu daily and add NovoLog 2 units subcu with each meals.  Continue sliding scale ? ?ESRD on hemodialysis ([place]) ?HD per nephrology ? ?History of DVT (deep vein thrombosis) ?Patient is on Eliquis for his history of DVT in December 2022 ?Continue Eliquis ? ?Stomach upset ?Attributed to medications being given all at once on empty stomach. ?Continue as needed antiemetic, trial of Maalox ?Okay to spread out medications and provide snacks in between meals ? ?Depression ?Started on Lexapro 5 mg nightly by nephrology.  Patient with several health challenges, recurrent hospital  admissions reportedly going through marital separation as well. ? ? ? ? ?  ? ?Subjective: Pt seen in with male family member at bedside today,  awake sitting up in bed.  He reports his left arm pain has improved.  He is able to move the arm and has some return of grip strength although still numb in the fingertips.  Says the arm is a lot better.  He does complain of stomach upset that he attributes to being given so many medications all at once.  He also dislikes the food here which occasionally also upsets his stomach.  Says that he has only had a slice of bread and 1 cup of coffee today so far. ? ?Physical Exam: ?Vitals:  ? 10/05/21 1740 10/05/21 2006 10/06/21 0401 10/06/21 0818  ?BP: (!) 147/75 (!) 143/75 (!) 166/84 (!) 154/79  ?Pulse: 81 84 76 75  ?Resp:  16 16 18   ?Temp: 98.1 ?F (36.7 ?C) 98.7 ?F (37.1 ?C) 98.3 ?F (36.8 ?C) 98.6 ?F (37 ?C)  ?TempSrc: Oral   Oral  ?SpO2: 95% 100% 97% 97%  ?Weight:      ?Height:      ? ?General exam: awake, alert, no acute distress, chronically ill-appearing ?HEENT:  moist mucus membranes, hearing grossly normal  ?Respiratory system: normal respiratory effort, on room air. ?Cardiovascular system: RRR, no lower extremity edema ?Central nervous system: A&O x3. no gross focal neurologic deficits, normal speech ?Extremities: Left upper extremity with improved edema, better mobility on LUE and able to grip - overall improved ?Skin: dry, intact, normal temperature ?Psychiatry: depressed mood, congruent affect, judgement and insight appear normal ? ? ?Data Reviewed: ? ?Notable labs: Hemoglobin 10.0.  CBGs controlled. ? ?Family Communication: None at bedside, seen in dialysis ? ?Disposition: ?Status is: Inpatient ?Remains inpatient appropriate because: Ongoing evaluation of persistent left upper extremity pain and swelling not appropriate for the outpatient setting ? ? Planned Discharge Destination: Skilled nursing facility ? ? ? ?Time spent: 35 minutes ? ?Author: ?Ezekiel Slocumb, DO ?10/06/2021 2:52 PM ? ?For on call review [email or web address].  ?

## 2021-10-07 ENCOUNTER — Other Ambulatory Visit: Payer: Self-pay

## 2021-10-07 ENCOUNTER — Inpatient Hospital Stay: Payer: Medicare Other

## 2021-10-07 DIAGNOSIS — R109 Unspecified abdominal pain: Secondary | ICD-10-CM | POA: Clinically undetermined

## 2021-10-07 DIAGNOSIS — M79602 Pain in left arm: Secondary | ICD-10-CM | POA: Diagnosis not present

## 2021-10-07 LAB — CULTURE, BLOOD (ROUTINE X 2)
Culture: NO GROWTH
Special Requests: ADEQUATE

## 2021-10-07 LAB — GLUCOSE, CAPILLARY
Glucose-Capillary: 127 mg/dL — ABNORMAL HIGH (ref 70–99)
Glucose-Capillary: 118 mg/dL — ABNORMAL HIGH (ref 70–99)
Glucose-Capillary: 108 mg/dL — ABNORMAL HIGH (ref 70–99)
Glucose-Capillary: 235 mg/dL — ABNORMAL HIGH (ref 70–99)
Glucose-Capillary: 198 mg/dL — ABNORMAL HIGH (ref 70–99)
Glucose-Capillary: 224 mg/dL — ABNORMAL HIGH (ref 70–99)

## 2021-10-07 MED ORDER — EPOETIN ALFA 4000 UNIT/ML IJ SOLN
INTRAMUSCULAR | Status: AC
  Filled 2021-10-07: qty 1

## 2021-10-07 MED ORDER — ACETAMINOPHEN 325 MG PO TABS
ORAL_TABLET | ORAL | Status: AC
  Filled 2021-10-07: qty 2

## 2021-10-07 MED ORDER — RENA-VITE PO TABS
1.0000 | ORAL_TABLET | Freq: Every day | ORAL | Status: DC
  Administered 2021-10-07: 1 via ORAL
  Filled 2021-10-07: qty 1

## 2021-10-07 MED ORDER — NEPRO/CARBSTEADY PO LIQD
237.0000 mL | Freq: Three times a day (TID) | ORAL | Status: DC
  Administered 2021-10-07 – 2021-10-08 (×2): 237 mL via ORAL

## 2021-10-07 MED ORDER — HEPARIN SODIUM (PORCINE) 1000 UNIT/ML IJ SOLN
INTRAMUSCULAR | Status: AC
  Filled 2021-10-07: qty 10

## 2021-10-07 NOTE — TOC Progression Note (Signed)
Transition of Care (TOC) - Progression Note  ? ? ?Patient Details  ?Name: Jimmy Olson ?MRN: [id number] ?Date of Birth: 12/29/1960 ? ?Transition of Care (TOC) CM/SW Contact  ? T , RN ?Phone Number: ?10/07/2021, 1:46 PM ? ?Clinical Narrative:    ? ?Met with patient at bedside in HD along with Spanish interpreter.  ?Bed offers presented. Patient declines offers.  Patient states that he is unable to turn over his monthly check  ? ?Nephrology also at bedside. Patient states his plan is to return home at discharge his wife (who he is separated from, but still lives with) will be picking him up at discharge ? ?No Dme needed for discharge.  ?TOC was unable to find a home health agency that was unable to accept for discharge.  Patient and nephrology aware  ? ?Per [place] she has left a message for [employer] ? ?Expected Discharge Plan: Home/Self Care ?Barriers to Discharge: Continued Medical Work up ? ?Expected Discharge Plan and Services ?Expected Discharge Plan: Home/Self Care ?  ?  ?Post Acute Care Choice: NA ?Living arrangements for the past 2 months: Single Family Home ?                ?  ?  ?  ?  ?  ?  ?  ?  ?  ?  ? ? ?Social Determinants of Health (SDOH) Interventions ?  ? ?Readmission Risk Interventions ? ?  10/03/2021  ?  2:32 PM 08/08/2021  ?  8:49 AM 06/01/2021  ?  2:05 PM  ?Readmission Risk Prevention Plan  ?Transportation Screening Complete Complete Complete  ?PCP or Specialist Appt within 3-5 Days Complete Complete Complete  ?[place] or Home Care Consult  Complete Complete  ?Social Work Consult for Recovery Care Planning/Counseling Complete Complete Complete  ?Palliative Care Screening Not Applicable Not Applicable Not Applicable  ?Medication Review (RN Care Manager) Complete Complete Referral to Pharmacy  ? ? ?

## 2021-10-07 NOTE — Progress Notes (Signed)
Hemodialysis Post Treatment Note ? ?Patient completes 3 hour treatment without incident. CVC without signs of infection. Prescribed BFR maintained throughout treatment. Targeted UF met. Patient concerned about the noise of machines. Report given to primary nurse, request patient be sent to X-ray, pt transported to X-ray from HD.  ?

## 2021-10-07 NOTE — Plan of Care (Signed)
  Problem: Health Behavior/Discharge Planning: Goal: Ability to manage health-related needs will improve Outcome: Progressing   Problem: Clinical Measurements: Goal: Ability to maintain clinical measurements within normal limits will improve Outcome: Progressing   

## 2021-10-07 NOTE — Progress Notes (Signed)
Initial Nutrition Assessment ? ?DOCUMENTATION CODES:  ? ?Non-severe (moderate) malnutrition in context of chronic illness ? ?INTERVENTION:  ? ?Nepro Shake po TID, each supplement provides 425 kcal and 19 grams protein ? ?Rena-vit po daily  ? ?NUTRITION DIAGNOSIS:  ? ?Moderate Malnutrition related to chronic illness (ESRD on HD) as evidenced by moderate fat depletion, moderate muscle depletion. ? ?GOAL:  ? ?Patient will meet greater than or equal to 90% of their needs ? ?MONITOR:  ? ?PO intake, Supplement acceptance, Labs, Weight trends, I & O's, Skin ? ?REASON FOR ASSESSMENT:  ? ?Malnutrition Screening Tool ?  ? ?ASSESSMENT:  ? ?61 y/o male with h/o HTN, DM, CHF, CAD, ESRD on HD, DVT, NSTEMI, COVID (02/2020), anxiety and depression who is admitted with steal syndrome. ? ?Met with pt in room today. Pt reports good appetite and oral intake at baseline. Pt reports that he did not get any breakfast this morning as none was brought to him. Pt did not eat any lunch as he reports that he did not like the food. Pt has ordered his dinner for tonight. Pt reports that he drinks supplements during dialysis; he enjoys the strawberry and vanilla flavors. RD will add supplements and rena-vite to help pt meet his estimated needs. Per chart, pt appears weight stable pta.  ? ?Medications reviewed and include: epoetin, heparin, insulin, senokot ? ?Labs reviewed: K 4.4 wnl, BUN 33(H), creat 4.61(H), P 4.7(H) ?Hgb 10.3(L), Hct 31.1(L) ?Cbgs- 235, 108, 118, 127 x 24 hrs ?AIC 10.1(H)- 1/26 ? ?NUTRITION - FOCUSED PHYSICAL EXAM: ? ?Flowsheet Row Most Recent Value  ?Orbital Region Mild depletion  ?Upper Arm Region Severe depletion  ?Thoracic and Lumbar Region Moderate depletion  ?Buccal Region Mild depletion  ?Temple Region Mild depletion  ?Clavicle Bone Region Mild depletion  ?Clavicle and Acromion Bone Region Mild depletion  ?Scapular Bone Region Mild depletion  ?Dorsal Hand Moderate depletion  ?Patellar Region Severe depletion  ?Anterior  Thigh Region Severe depletion  ?Posterior Calf Region Severe depletion  ?Edema (RD Assessment) Mild  ?Hair Reviewed  ?Eyes Reviewed  ?Mouth Reviewed  ?Skin Reviewed  ?Nails Reviewed  ? ?Diet Order:   ?Diet Order   ? ?       ?  Diet 2 gram sodium Room service appropriate? Yes; Fluid consistency: Thin  Diet effective now       ?  ? ?  ?  ? ?  ? ?EDUCATION NEEDS:  ? ?Education needs have been addressed ? ?Skin:  Skin Assessment: Reviewed RN Assessment (incision L arm) ? ?Last BM:  4/21 ? ?Height:  ? ?Ht Readings from Last 1 Encounters:  ?10/03/21 _0  (1.676 m)  ? ? ?Weight:  ? ?Wt Readings from Last 1 Encounters:  ?10/07/21 64.5 kg  ? ? ?Ideal Body Weight:  64.5 kg ? ?BMI:  Body mass index is 22.95 kg/m?. ? ?Estimated Nutritional Needs:  ? ?Kcal:  1900-2200kcal/day ? ?Protein:  95-110g/day ? ?Fluid:  UOP +1L ? ?Koleen Distance MS, RD, LDN ?Please refer to AMION for RD and/or RD on-call/weekend/after hours pager ? ?

## 2021-10-07 NOTE — Progress Notes (Signed)
OT Cancellation Note ? ?Patient Details ?Name: Jimmy Olson ?MRN: [id number] ?DOB: July 08, 1960 ? ? ?Cancelled Treatment:    Reason Eval/Treat Not Completed: Patient at procedure or test/ unavailable. OT continues to follow pt for OT services. Pt currently OTF for HD, not available for OT tx at this time. Will hold and re-attempt at a later date/time as available and pt medically appropriate for OT services.  ? ?Shara Blazing, M.S., OTR/L ?[facility] - [place] ?Ascom: [phone] ?10/07/21, 1:02 PM ? ?

## 2021-10-07 NOTE — Progress Notes (Signed)
PT Cancellation Note ? ?Patient Details ?Name: Jimmy Olson ?MRN: [id number] ?DOB: 1960/06/30 ? ? ?Cancelled Treatment:     PT attempt. Pt off floor. Will continue to follow and treat when pt is available to participate. ? ? ?Willette Pa ?10/07/2021, 10:09 AM ?

## 2021-10-07 NOTE — Progress Notes (Signed)
?Progress Note ? ? ?Patient: Jimmy Olson FIE:[id number] DOB: [birth year] DOA: 10/01/2021     6 ?DOS: the patient was seen and examined on 10/07/2021 ?  ?Brief hospital course: ?61 y.o. male with medical history significant of diabetes mellitus type 2, end-stage renal disease on CAD, history of DVT presenting with left arm pain swelling and numbness x1 day admitted for left steal syndrome  ? ?4/16: Ligation of left radiocephalic AV fistula ?4/17: Transfer to floor.  Carotid Doppler showed no hemodynamically significant stenosis.  Left upper extremity Doppler negative for DVT ?4/18: Neurology, PT/OT evaluations, MRI of the brain negative for acute stroke ?4/19: dialysis, plan for MRI of Left UE to further evaluate ? ?4/20-21: new onset abdominal pain, worsening, tenderness on exam, evaluation pending ? ?Assessment and Plan: ?* Diffuse pain in left upper extremity ?Still has weakness, swelling and severe pain.  Left upper extremity Doppler negative for DVT.  Carotid Dopplers showing no hemodynamically significant stenosis.  Vascular surgery recommends neurology, PT OT and pain control.  A lot of his symptoms has worsened since AV fistula placement on 4/13.  He did have some residual weakness from prior stroke in the same area ? ?Neurology recommended MRI of the brain which was negative for stroke.   ? ?MRI left upper extremity on 4/19: Showed severe diffuse subcutaneous soft tissue swelling from the distal left upper arm through the forearm, can be seen in cellulitis or lymphedema.  Distention of the olecranon bursa measuring 2.0X 1.0 cm.  No other well-defined or drainable fluid collections seen.  Nonspecific perimuscular intramuscular edema along the left rotator cuff musculature, anterior deltoid, left trapezius lower fibers.  Nonspecific mild interfascial edema within the muscular compartments of the distal forearm.  No osseous abnormalities. ? ?CK checked and within normal limits ? ?-- Continue supportive care with  analgesics as needed ?-- Stop ibuprofen given new onset abdominal pain since this was started ? ? ? ?Steal syndrome as complication of dialysis access [facility]) ?Status post emergent ligation of the AV fistula on 4/16 by vascular ?Patient continues complain of left arm pain, numbness, weakness and swelling ? ?Hypertensive emergency ?Blood pressure (!) 167/88, pulse 78, temperature 98.4 ?F (36.9 ?C), temperature source Oral, resp. rate 18, height 5\' 6"  (1.676 m), weight 65.9 kg, SpO2 96 %. ?Initially required nicardipine drip but now weaned off. ?continue patient on Imdur, hydralazine, labetalol, Coreg, along with as needed labetalol ?Added losartan 25 mg p.o. daily today for better blood pressure control ? ? ?HFrEF (heart failure with reduced ejection fraction) ([place]) ?EF 40 to 45% during echo in February 2023 ?Continue patient on Coreg, Imdur, labetalol, hydralazine and losartan ?cardiology is following ? ?Chest pain ?None while here.  Monitor on telemetry-ejection fraction 40 to 45% on last cath ? ? ?Coronary artery disease involving native coronary artery of native heart without angina pectoris ?Cardiology following.  Continue Coreg and Imdur, aspirin.  He denies any anginal symptoms ? ? ?Type 2 diabetes mellitus with hyperlipidemia ([place]) ?Glycemic protocol per every 6 hours Accu-Chek.  diabetic nurse following. ?Increase Levemir to 8 units subcu daily and add NovoLog 2 units subcu with each meals.  Continue sliding scale ? ?ESRD on hemodialysis ([place]) ?HD per nephrology ? ?History of DVT (deep vein thrombosis) ?Patient is on Eliquis for his history of DVT in December 2022 ?Continue Eliquis ? ?Abdominal pain ?Patient started reporting severe abdominal pain yesterday (4/20).  He is having loose stools secondary to laxatives given.  Reports abdominal pain is crampy, sharp  at times.  Does have tenderness on exam but abdomen is soft.  Patient attributes to the hospital food and being given so many medications all at  once. ?-- Abdominal x-ray pending ?-- Simethicone trial ?--Stop ibuprofen, avoid NSAIDs ?-- CT abdomen depending on x-ray results ? ?Stomach upset ?Attributed to medications being given all at once on empty stomach. ?Continue as needed antiemetic, trial of Maalox ?Okay to spread out medications and provide snacks in between meals ? ?Depression ?Started on Lexapro 5 mg nightly by nephrology.  Patient with several health challenges, recurrent hospital admissions reportedly going through marital separation as well. ? ? ? ? ?  ? ?Subjective: Patient seen after dialysis today.  He has been reporting abdominal pain with nausea and diarrhea since yesterday.  He attributes diarrhea to being given stool softeners.  Abdominal pain describes as crampy but sharp at times.  No fevers or chills.   ? ?Physical Exam: ?Vitals:  ? 10/07/21 1245 10/07/21 1257 10/07/21 1300 10/07/21 1332  ?BP: (!) 166/81  (!) 187/77 (!) 202/89  ?Pulse: 77  78 80  ?Resp: (!) 9  (!) 0 16  ?Temp:  98.4 ?F (36.9 ?C)  97.9 ?F (36.6 ?C)  ?TempSrc:  Oral    ?SpO2:   100% 100%  ?Weight:   64.5 kg   ?Height:      ? ?General exam: awake, alert, no acute distress, fatigued and chronically ill-appearing ?HEENT: moist mucus membranes, hearing grossly normal  ?Respiratory system: normal respiratory effort at rest, on room air. ?Cardiovascular system: RRR, no pedal edema, 2+ radial pulse bilaterally.   ?Gastrointestinal system: soft, nondistended, tender on palpation with guarding but no rebound tenderness, bowel sounds present. ?Central nervous system: A&O x4. no gross focal neurologic deficits, normal speech ?Extremities: Left upper extremity swelling has drastically improved and arm is no longer tender on palpation moves all, no cyanosis, normal tone ?Skin: dry, intact, normal temperature ?Psychiatry: normal mood, congruent affect, judgement and insight appear normal ? ? ? ?Data Reviewed: ? ?Notable labs: CBGs controlled.  Otherwise no new labs today ? ?Abdominal  x-ray: Nonspecific bowel gas pattern.  No abnormal masses or calcifications are seen. ? ?Family Communication: None at bedside.  Ex-wife was at bedside on rounds yesterday ? ?Disposition: ?Status is: Inpatient ?Remains inpatient appropriate because: Ongoing abdominal pain with evaluation underway ? ? Planned Discharge Destination: Home with Home Health ? ? ? ?Time spent: 45 minutes including time spent at bedside and in coordination of care with consultants and other staff, review of current and prior past medical records. ? ?Author: ?Ezekiel Slocumb, DO ?10/07/2021 3:00 PM ? ?For on call review [email or web address].  ?
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
## 2021-10-07 NOTE — Progress Notes (Signed)
[facility] ?[place], [name] ?10/07/21 ? ?Subjective:  ? ?LOS: 6 ? ?Patient seen and evaluated during dialysis ?  ?HEMODIALYSIS FLOWSHEET: ? ?Blood Flow Rate (mL/min): 400 mL/min ?Arterial Pressure (mmHg): -190 mmHg ?Venous Pressure (mmHg): 120 mmHg ?Transmembrane Pressure (mmHg): 40 mmHg ?Ultrafiltration Rate (mL/min): 330 mL/min ?Dialysate Flow Rate (mL/min): 660 ml/min ?Conductivity: Machine : 13.7 ?Conductivity: Machine : 13.7 ?Dialysis Fluid Bolus: Normal Saline ?Bolus Amount (mL): 250 mL ? ?Complains of nausea, abdominal pain and diarrhea.  ?Improved shoulder pain ? ?Objective:  ?Vital signs in last 24 hours:  ?Temp:  [97.9 ?F (36.6 ?C)-98.7 ?F (37.1 ?C)] 97.9 ?F (36.6 ?C) (04/21 1332) ?Pulse Rate:  [72-82] 80 (04/21 1332) ?Resp:  [0-23] 16 (04/21 1332) ?BP: (125-202)/(70-90) 202/89 (04/21 1332) ?SpO2:  [98 %-100 %] 100 % (04/21 1332) ?Weight:  [60.6 kg-64.5 kg] 64.5 kg (04/21 1300) ? ?Weight change:  ?Filed Weights  ? 10/05/21 1337 10/07/21 0950 10/07/21 1300  ?Weight: 66.1 kg 60.6 kg 64.5 kg  ? ? ?Intake/Output: ?  ? ?Intake/Output Summary (Last 24 hours) at 10/07/2021 1353 ?Last data filed at 10/07/2021 1300 ?Gross per 24 hour  ?Intake 240 ml  ?Output 500 ml  ?Net -260 ml  ? ? ? ?Physical Exam: ?General: NAD, laying in bed  ?HEENT  anicteric, moist oral mucous membrane  ?Pulm/lungs  normal breathing effort, lungs are clear to auscultation  ?CVS/Heart  regular rhythm, no rub or gallop  ?Abdomen:   Soft, nontender  ?Extremities:  No peripheral edema, left hand warm, non pitting left arm edema  ?Neurologic:  Alert, oriented, able to follow commands  ?Skin:  No acute rashes  ?Right IJ tunneled dialysis catheter ? ? ? ?Basic Metabolic Panel: ? ?Recent Labs  ?Lab 10/01/21 ?1937 10/03/21 ?0506 10/06/21 ?8889  ?NA 133* 135 135  ?K 4.4 4.3 4.4  ?CL 96* 99 100  ?CO2 28 25 28   ?GLUCOSE 515* 241* 167*  ?BUN 45* 58* 33*  ?CREATININE 4.06* 5.37* 4.61*  ?CALCIUM 9.0 8.8* 8.0*  ?PHOS  --   --  4.7*   ? ? ? ? ?CBC: ?Recent Labs  ?Lab 10/01/21 ?1937 10/03/21 ?0506 10/05/21 ?1127 10/06/21 ?1694  ?WBC 5.4 6.7 5.7 5.2  ?NEUTROABS 4.1  --   --   --   ?HGB 10.2* 11.1* 10.0* 10.3*  ?HCT 31.6* 33.8* 30.8* 31.1*  ?MCV 88.3 88.5 88.3 85.7  ?PLT 176 176 163 189  ? ? ? ?  ?Lab Results  ?Component Value Date  ? HEPBSAG NON REACTIVE 10/05/2021  ? HEPBSAB NON REACTIVE 10/05/2021  ? ? ? ? ?Microbiology: ? ?Recent Results (from the past 240 hour(s))  ?Culture, blood (Routine X 2) w Reflex to ID Panel     Status: None  ? Collection Time: 10/01/21 10:29 PM  ? Specimen: BLOOD  ?Result Value Ref Range Status  ? Specimen Description BLOOD BLOOD RIGHT ARM  Final  ? Special Requests   Final  ?  BOTTLES DRAWN AEROBIC AND ANAEROBIC Blood Culture adequate volume  ? Culture   Final  ?  NO GROWTH 5 DAYS ?Performed at [facility], [address] [place], [place] [address] ?  ? Report Status 10/06/2021 FINAL  Final  ?MRSA Next Gen by PCR, Nasal     Status: None  ? Collection Time: 10/02/21  1:04 AM  ? Specimen: Nasal Mucosa; Nasal Swab  ?Result Value Ref Range Status  ? MRSA by PCR Next Gen NOT DETECTED NOT DETECTED Final  ?  Comment: (NOTE) ?  The GeneXpert MRSA Assay (FDA approved for NASAL specimens only), ?is one component of a comprehensive MRSA colonization surveillance ?program. It is not intended to diagnose MRSA infection nor to guide ?or monitor treatment for MRSA infections. ?Test performance is not FDA approved in patients less than 2 years ?old. ?Performed at [facility], [place], ?[name] [address] ?  ?Culture, blood (Routine X 2) w Reflex to ID Panel     Status: None  ? Collection Time: 10/02/21 10:28 PM  ? Specimen: BLOOD  ?Result Value Ref Range Status  ? Specimen Description BLOOD RIGHT HAND  Final  ? Special Requests   Final  ?  BOTTLES DRAWN AEROBIC AND ANAEROBIC Blood Culture adequate volume  ? Culture   Final  ?  NO GROWTH 5 DAYS ?Performed at [facility], [address] [place], [place] [address] ?  ? Report Status 10/07/2021 FINAL  Final  ? ? ?Coagulation Studies: ?No results for input(s): LABPROT, INR in the last 72 hours. ? ?Urinalysis: ?No results for input(s): COLORURINE, LABSPEC, [place], GLUCOSEU, HGBUR, BILIRUBINUR, KETONESUR, PROTEINUR, UROBILINOGEN, NITRITE, LEUKOCYTESUR in the last 72 hours. ? ?Invalid input(s): APPERANCEUR  ? ? ?Imaging: ?MR HUMERUS LEFT WO CONTRAST ? ?Result Date: 10/05/2021 ?CLINICAL DATA:  Persistent pain, swelling, numbness in left shoulder down to distal forearm, onset after new AV fistula EXAM: MRI OF THE LEFT HUMERUS WITHOUT CONTRAST; MRI OF THE LEFT FOREARM WITHOUT CONTRAST TECHNIQUE: Multiplanar, multisequence MR imaging of the left humerus and forearm was performed. No intravenous contrast was administered. COMPARISON:  None. FINDINGS: Limited MRI of the forearm (per report, patient refused to continue scan). There is susceptibility artifact along the mid upper arm, which limits evaluation of the bony and soft tissues of this region. Bones/Joint/Cartilage The cortex is intact. There is no significant marrow signal alteration. Ligaments Grossly intact Muscles and Tendons Nonspecific perimuscular and intramuscular edema along the cuff musculature, anterior deltoid, and left trapezius lower fibers. No intramuscular edema of the upper arm or forearm. No intramuscular collection. There is mild interfascial edema within the muscular compartments of the distal forearm. Soft tissues There is severe diffuse subcutaneous soft tissue swelling from the distal left upper arm through the forearm. There is distension of the olecranon bursa measuring up to 2.0 x 1.0 cm (forearm series 10, image 8). No other well-defined/drainable fluid collection on noncontrast MRI. Mild soft tissue edema along the posterior aspect of the clavicle and in the left axilla. IMPRESSION: Susceptibility artifact along the mid upper arm, limiting evaluation in this region. Limited MRI  of the forearm. There is severe diffuse subcutaneous soft tissue swelling from the distal left upper arm through the forearm, can be seen in cellulitis or lymphedema. Distension of the olecranon bursa measuring 2.0 x 1.0 cm. No other well-defined/drainable fluid collection on noncontrast MRI. Nonspecific perimuscular and intramuscular edema along the left rotator cuff musculature, anterior deltoid, and left trapezius lower fibers. Consider correlation with creatine kinase. Nonspecific mild interfascial edema within the muscular compartments of the distal forearm. No acute osseous abnormality. Electronically Signed   By: Maurine Simmering M.D.   On: 10/05/2021 18:30  ? ?MR FOREARM LEFT WO CONTRAST ? ?Result Date: 10/05/2021 ?CLINICAL DATA:  Persistent pain, swelling, numbness in left shoulder down to distal forearm, onset after new AV fistula EXAM: MRI OF THE LEFT HUMERUS WITHOUT CONTRAST; MRI OF THE LEFT FOREARM WITHOUT CONTRAST TECHNIQUE: Multiplanar, multisequence MR imaging of the left humerus and forearm was performed. No intravenous contrast  was administered. COMPARISON:  None. FINDINGS: Limited MRI of the forearm (per report, patient refused to continue scan). There is susceptibility artifact along the mid upper arm, which limits evaluation of the bony and soft tissues of this region. Bones/Joint/Cartilage The cortex is intact. There is no significant marrow signal alteration. Ligaments Grossly intact Muscles and Tendons Nonspecific perimuscular and intramuscular edema along the cuff musculature, anterior deltoid, and left trapezius lower fibers. No intramuscular edema of the upper arm or forearm. No intramuscular collection. There is mild interfascial edema within the muscular compartments of the distal forearm. Soft tissues There is severe diffuse subcutaneous soft tissue swelling from the distal left upper arm through the forearm. There is distension of the olecranon bursa measuring up to 2.0 x 1.0 cm (forearm  series 10, image 8). No other well-defined/drainable fluid collection on noncontrast MRI. Mild soft tissue edema along the posterior aspect of the clavicle and in the left axilla. IMPRESSION: Susceptibility a

## 2021-10-07 NOTE — Plan of Care (Signed)
?  Problem: Clinical Measurements: ?Goal: Ability to maintain clinical measurements within normal limits will improve ?Outcome: Progressing ?Goal: Will remain free from infection ?Outcome: Progressing ?Goal: Diagnostic test results will improve ?Outcome: Progressing ?Goal: Respiratory complications will improve ?Outcome: Progressing ?Goal: Cardiovascular complication will be avoided ?Outcome: Progressing ?  ?Pt is alert and oriented. V/S stable. Complained of abdominal pain; refused to take pain medicine because pt said he have not had anything to eat. Offered something to snack on but pt refused. Pt had several bm's this shift. Encouraged pt to keep his left arm elevated.  ?

## 2021-10-07 NOTE — Assessment & Plan Note (Addendum)
Patient started reporting severe abdominal pain yesterday (4/20).  He is having loose stools secondary to laxatives given.  Reports abdominal pain is crampy, sharp at times.  Does have tenderness on exam but abdomen is soft.  Patient attributes to the hospital food and being given so many medications all at once. ?-- Abdominal x-ray no acute findings ?-- Simethicone trial - unclear if helpful ?--Stop ibuprofen, avoid NSAIDs ?-- CT abdomen/pevlis no acute findings ?--GI follow up outpatient. ?

## 2021-10-08 DIAGNOSIS — R1010 Upper abdominal pain, unspecified: Secondary | ICD-10-CM | POA: Diagnosis not present

## 2021-10-08 DIAGNOSIS — E440 Moderate protein-calorie malnutrition: Secondary | ICD-10-CM | POA: Insufficient documentation

## 2021-10-08 DIAGNOSIS — K219 Gastro-esophageal reflux disease without esophagitis: Secondary | ICD-10-CM | POA: Diagnosis not present

## 2021-10-08 DIAGNOSIS — M79602 Pain in left arm: Secondary | ICD-10-CM | POA: Diagnosis not present

## 2021-10-08 LAB — GLUCOSE, CAPILLARY
Glucose-Capillary: 169 mg/dL — ABNORMAL HIGH (ref 70–99)
Glucose-Capillary: 120 mg/dL — ABNORMAL HIGH (ref 70–99)

## 2021-10-08 LAB — COMPREHENSIVE METABOLIC PANEL
ALT: 16 U/L (ref 0–44)
AST: 26 U/L (ref 15–41)
Albumin: 2.8 g/dL — ABNORMAL LOW (ref 3.5–5.0)
Alkaline Phosphatase: 73 U/L (ref 38–126)
Anion gap: 6 (ref 5–15)
BUN: 23 mg/dL — ABNORMAL HIGH (ref 6–20)
CO2: 30 mmol/L (ref 22–32)
Calcium: 8.5 mg/dL — ABNORMAL LOW (ref 8.9–10.3)
Chloride: 99 mmol/L (ref 98–111)
Creatinine, Ser: 3.72 mg/dL — ABNORMAL HIGH (ref 0.61–1.24)
GFR, Estimated: 18 mL/min — ABNORMAL LOW (ref >60)
Glucose, Bld: 113 mg/dL — ABNORMAL HIGH (ref 70–99)
Potassium: 4 mmol/L (ref 3.5–5.1)
Sodium: 135 mmol/L (ref 135–145)
Total Bilirubin: 0.4 mg/dL (ref 0.3–1.2)
Total Protein: 6.2 g/dL — ABNORMAL LOW (ref 6.5–8.1)

## 2021-10-08 MED ORDER — NEPRO/CARBSTEADY PO LIQD: 237.0000 mL | Freq: Three times a day (TID) | ORAL | 0 refills | Status: DC

## 2021-10-08 MED ORDER — ISOSORBIDE MONONITRATE ER 30 MG PO TB24: 30.0000 mg | ORAL_TABLET | Freq: Every day | ORAL | 1 refills | Status: DC

## 2021-10-08 MED ORDER — RENA-VITE PO TABS: 1.0000 | ORAL_TABLET | Freq: Every day | ORAL | 2 refills | Status: DC

## 2021-10-08 MED ORDER — LOSARTAN POTASSIUM 50 MG PO TABS: 50.0000 mg | ORAL_TABLET | Freq: Every day | ORAL | 2 refills | Status: DC

## 2021-10-08 MED ORDER — LEVEMIR FLEXTOUCH 100 UNIT/ML ~~LOC~~ SOPN: 15.0000 [IU] | PEN_INJECTOR | Freq: Every day | SUBCUTANEOUS | 2 refills | Status: DC

## 2021-10-08 MED ORDER — ROSUVASTATIN CALCIUM 10 MG PO TABS: 10.0000 mg | ORAL_TABLET | Freq: Every day | ORAL | 1 refills | Status: DC

## 2021-10-08 MED ORDER — PANTOPRAZOLE SODIUM 40 MG IV SOLR
40.0000 mg | Freq: Two times a day (BID) | INTRAVENOUS | Status: DC
  Administered 2021-10-08: 40 mg via INTRAVENOUS
  Filled 2021-10-08: qty 10

## 2021-10-08 MED ORDER — ESCITALOPRAM OXALATE 5 MG PO TABS: 5.0000 mg | ORAL_TABLET | Freq: Every day | ORAL | 2 refills | Status: DC

## 2021-10-08 NOTE — Progress Notes (Addendum)
Jimmy Olson to be D/C'd Home per MD order.  Discussed prescriptions and follow up appointments with the patient. Prescriptions given to patient, medication list explained in detail. Pt verbalized understanding. Explained to pt that Dr. Verlin Grills office will call him next week to set up appointment for EGD. Pt verbalized understanding. ? ?Allergies as of 10/08/2021   ?No Known Allergies ?  ? ?  ?Medication List  ?  ? ?STOP taking these medications   ? ?omeprazole 20 MG capsule ?Commonly known as: PRILOSEC ?  ?sertraline 50 MG tablet ?Commonly known as: ZOLOFT ?  ? ?  ? ?TAKE these medications   ? ?albuterol 108 (90 Base) MCG/ACT inhaler ?Commonly known as: Proventil HFA ?INHALE 2 PULSACIONES EN LOS PULMONES UNA VEZ CADA 6 HORAS SEG?N SE NECESITE PARA SIBILIZAR O FALTA DE AIRE. ?(INHALE 2 PUFFS INTO THE LUNGS ONCE EVERY 6 HOURS AS NEEDED FOR WHEEZING OR SHORTNESS OF BREATH.) ?  ?carvedilol 25 MG tablet ?Commonly known as: COREG ?Tome 1 tableta (25 mg en total) por v?a oral 2 (dos) veces al d?a. ?(Take 1 tablet (25 mg total) by mouth 2 (two) times daily.) ?  ?Comfort EZ Pen Needles 32G X 4 MM Misc ?Generic drug: Insulin Pen Needle ?Use como se indica. ?(USE AS DIRECTED) ?  ?Eliquis 5 MG Tabs tablet ?Generic drug: apixaban ?TOME 1 TABLETA POR BOCA DOS VECES AL D?A. ?(TAKE 1 TABLET BY MOUTH TWICE DAILY.) ?What changed: when to take this ?  ?escitalopram 5 MG tablet ?Commonly known as: LEXAPRO ?Take 1 tablet (5 mg total) by mouth at bedtime. ?  ?ezetimibe 10 MG tablet ?Commonly known as: ZETIA ?Take 10 mg by mouth daily. ?  ?feeding supplement (NEPRO CARB STEADY) Liqd ?Take 237 mLs by mouth 3 (three) times daily between meals. ?  ?HumaLOG KwikPen 100 UNIT/ML KwikPen ?Generic drug: insulin lispro ?Inject into the skin 3 (three) times daily. Sliding scale ?  ?hydrALAZINE 100 MG tablet ?Commonly known as: APRESOLINE ?Tome 1 tableta (100 mg en total) por v?a oral una vez cada 8 (ocho) horas. ?(Take 1 tablet (100 mg total) by  mouth once every 8 (eight) hours.) ?What changed: when to take this ?  ?HYDROcodone-acetaminophen 5-325 MG tablet ?Commonly known as: NORCO/VICODIN ?Take 2 tablets by mouth every 6 (six) hours as needed for moderate pain. ?  ?isosorbide mononitrate 30 MG 24 hr tablet ?Commonly known as: IMDUR ?Take 1 tablet (30 mg total) by mouth daily. ?Start taking on: October 09, 2021 ?What changed: when to take this ?  ?Levemir FlexTouch 100 UNIT/ML FlexPen ?Generic drug: insulin detemir ?Inject 15 Units into the skin at bedtime. ?What changed: how much to take ?  ?losartan 50 MG tablet ?Commonly known as: COZAAR ?Take 1 tablet (50 mg total) by mouth at bedtime. ?  ?multivitamin Tabs tablet ?Take 1 tablet by mouth at bedtime. ?  ?nitroGLYCERIN 0.4 MG SL tablet ?Commonly known as: NITROSTAT ?Place 1 tablet (0.4 mg total) under the tongue every 5 (five) minutes as needed for chest pain. ?  ?rosuvastatin 10 MG tablet ?Commonly known as: CRESTOR ?Take 1 tablet (10 mg total) by mouth daily. ?Start taking on: October 09, 2021 ?What changed:  ?medication strength ?how much to take ?  ?torsemide 20 MG tablet ?Commonly known as: DEMADEX ?Take 20 mg by mouth daily as needed. ?  ? ?  ? ? ?Vitals:  ? 10/08/21 0352 10/08/21 0816  ?BP: 140/81 (!) 157/78  ?Pulse: 74 74  ?Resp: 18 18  ?Temp: 98.7 ?  F (37.1 ?C) 98 ?F (36.7 ?C)  ?SpO2: 100% 98%  ? ? ?Skin clean, dry and intact without evidence of skin break down, no evidence of skin tears noted. IV catheter discontinued intact. Site without signs and symptoms of complications. Dressing and pressure applied. Pt denies pain at this time. No complaints noted. ? ?An After Visit Summary was printed and given to the patient. ?Patient escorted via [place], and D/C home via private auto. ? ?Rolley Sims  ?

## 2021-10-08 NOTE — Discharge Summary (Signed)
?Physician Discharge Summary ?  ?Patient: Jimmy Olson MRN: [id number] DOB: 1961/03/23  ?Admit date:     10/01/2021  ?Discharge date: 10/08/2021  ?Discharge Physician: Ezekiel Slocumb  ? ?PCP: [facility] [place]  ? ?Recommendations at discharge:  ? ? PCP follow up in 1 week ?Repeat BMP, Mg, CBC in 1 week ?Follow up with GI as scheduled ?Follow up with nephrology and attend dialysis as scheduled. ? ?Discharge Diagnoses: ?Principal Problem: ?  Diffuse pain in left upper extremity ?Active Problems: ?  Steal syndrome as complication of dialysis access [facility]) ?  Left upper extremity swelling ?  Hypertensive emergency ?  HFrEF (heart failure with reduced ejection fraction) ([place]) ?  Chest pain ?  Coronary artery disease involving native coronary artery of native heart without angina pectoris ?  Type 2 diabetes mellitus with hyperlipidemia ([place]) ?  ESRD on hemodialysis ([place]) ?  History of DVT (deep vein thrombosis) ?  Depression ?  Stomach upset ?  Abdominal pain ?  Malnutrition of moderate degree ? ? ?Hospital Course: ?61 y.o. male with medical history significant of diabetes mellitus type 2, end-stage renal disease on CAD, history of DVT presenting with left arm pain swelling and numbness x1 day admitted for left steal syndrome  ? ?4/16: Ligation of left radiocephalic AV fistula ?4/17: Transfer to floor.  Carotid Doppler showed no hemodynamically significant stenosis.  Left upper extremity Doppler negative for DVT ?4/18: Neurology, PT/OT evaluations, MRI of the brain negative for acute stroke ?4/19: dialysis, plan for MRI of Left UE to further evaluate ? ?4/20-21: new onset abdominal pain, worsening, tenderness on exam, evaluation pending ? ?4/22: abdominal pain improved. Tolerating diet and clinically improved, stable for discharge home.  Pt agreeable to see GI in follow up. ? ? ?Assessment and Plan: ?* Diffuse pain in left upper extremity ?Still has weakness, swelling and severe pain.  Left upper  extremity Doppler negative for DVT.  Carotid Dopplers showing no hemodynamically significant stenosis.  Vascular surgery recommends neurology, PT OT and pain control.  A lot of his symptoms has worsened since AV fistula placement on 4/13.  He did have some residual weakness from prior stroke in the same area ? ?Neurology recommended MRI of the brain which was negative for stroke.   ? ?MRI left upper extremity on 4/19: Showed severe diffuse subcutaneous soft tissue swelling from the distal left upper arm through the forearm, can be seen in cellulitis or lymphedema.  Distention of the olecranon bursa measuring 2.0X 1.0 cm.  No other well-defined or drainable fluid collections seen.  Nonspecific perimuscular intramuscular edema along the left rotator cuff musculature, anterior deltoid, left trapezius lower fibers.  Nonspecific mild interfascial edema within the muscular compartments of the distal forearm.  No osseous abnormalities. ? ?CK checked and within normal limits ? ?-- Continue supportive care with analgesics as needed ?-- Stop ibuprofen given new onset abdominal pain since this was started ? ? ? ?Steal syndrome as complication of dialysis access [facility]) ?Status post emergent ligation of the AV fistula on 4/16 by vascular ?Patient continues complain of left arm pain, numbness, weakness and swelling ? ?Hypertensive emergency ?Blood pressure (!) 167/88, pulse 78, temperature 98.4 ?F (36.9 ?C), temperature source Oral, resp. rate 18, height 5\' 6"  (1.676 m), weight 65.9 kg, SpO2 96 %. ?Initially required nicardipine drip but now weaned off. ?continue patient on Imdur, hydralazine, labetalol, Coreg, along with as needed labetalol ?Added losartan 25 mg p.o. daily today for better blood pressure  control ? ? ?HFrEF (heart failure with reduced ejection fraction) ([place]) ?EF 40 to 45% during echo in February 2023 ?Continue patient on Coreg, Imdur, labetalol, hydralazine and losartan ?cardiology is following ? ?Chest  pain ?None while here.  Monitor on telemetry-ejection fraction 40 to 45% on last cath ? ? ?Coronary artery disease involving native coronary artery of native heart without angina pectoris ?Cardiology following.  Continue Coreg and Imdur, aspirin.  He denies any anginal symptoms ? ? ?Type 2 diabetes mellitus with hyperlipidemia ([place]) ?Glycemic protocol per every 6 hours Accu-Chek.  diabetic nurse following. ?Increase Levemir to 8 units subcu daily and add NovoLog 2 units subcu with each meals.  Continue sliding scale ? ?ESRD on hemodialysis ([place]) ?HD per nephrology ? ?History of DVT (deep vein thrombosis) ?Patient is on Eliquis for his history of DVT in December 2022 ?Continue Eliquis ? ?Abdominal pain ?Patient started reporting severe abdominal pain yesterday (4/20).  He is having loose stools secondary to laxatives given.  Reports abdominal pain is crampy, sharp at times.  Does have tenderness on exam but abdomen is soft.  Patient attributes to the hospital food and being given so many medications all at once. ?-- Abdominal x-ray pending ?-- Simethicone trial ?--Stop ibuprofen, avoid NSAIDs ?-- CT abdomen depending on x-ray results ? ?Stomach upset ?Attributed to medications being given all at once on empty stomach. ?Continue as needed antiemetic, trial of Maalox ?Okay to spread out medications and provide snacks in between meals ? ?Depression ?Started on Lexapro 5 mg nightly by nephrology.  Patient with several health challenges, recurrent hospital admissions reportedly going through marital separation as well. ? ? ? ? ?  ? ? ?Consultants: Nephrology, Vascular surgery, Cardiology, Neurology ?Procedures performed:   ?Disposition: Home ?Diet recommendation:  ?Discharge Diet Orders (From admission, onward)  ? ?  Start     Ordered  ? 10/08/21 0000  Diet - low sodium heart healthy       ? 10/08/21 0944  ? ?  ?  ? ?  ? ?Renal diet ?DISCHARGE MEDICATION: ?Allergies as of 10/08/2021   ?No Known Allergies ?  ? ?   ?Medication List  ?  ? ?STOP taking these medications   ? ?omeprazole 20 MG capsule ?Commonly known as: PRILOSEC ?  ?sertraline 50 MG tablet ?Commonly known as: ZOLOFT ?  ? ?  ? ?TAKE these medications   ? ?albuterol 108 (90 Base) MCG/ACT inhaler ?Commonly known as: Proventil HFA ?INHALE 2 PULSACIONES EN LOS PULMONES UNA VEZ CADA 6 HORAS SEG?N SE NECESITE PARA SIBILIZAR O FALTA DE AIRE. ?(INHALE 2 PUFFS INTO THE LUNGS ONCE EVERY 6 HOURS AS NEEDED FOR WHEEZING OR SHORTNESS OF BREATH.) ?  ?carvedilol 25 MG tablet ?Commonly known as: COREG ?Tome 1 tableta (25 mg en total) por v?a oral 2 (dos) veces al d?a. ?(Take 1 tablet (25 mg total) by mouth 2 (two) times daily.) ?  ?Comfort EZ Pen Needles 32G X 4 MM Misc ?Generic drug: Insulin Pen Needle ?Use como se indica. ?(USE AS DIRECTED) ?  ?Eliquis 5 MG Tabs tablet ?Generic drug: apixaban ?TOME 1 TABLETA POR BOCA DOS VECES AL D?A. ?(TAKE 1 TABLET BY MOUTH TWICE DAILY.) ?What changed: when to take this ?  ?escitalopram 5 MG tablet ?Commonly known as: LEXAPRO ?Take 1 tablet (5 mg total) by mouth at bedtime. ?  ?ezetimibe 10 MG tablet ?Commonly known as: ZETIA ?Take 10 mg by mouth daily. ?  ?feeding supplement (NEPRO CARB STEADY) Liqd ?Take 237 mLs by  mouth 3 (three) times daily between meals. ?  ?HumaLOG KwikPen 100 UNIT/ML KwikPen ?Generic drug: insulin lispro ?Inject into the skin 3 (three) times daily. Sliding scale ?  ?hydrALAZINE 100 MG tablet ?Commonly known as: APRESOLINE ?Tome 1 tableta (100 mg en total) por v?a oral una vez cada 8 (ocho) horas. ?(Take 1 tablet (100 mg total) by mouth once every 8 (eight) hours.) ?What changed: when to take this ?  ?HYDROcodone-acetaminophen 5-325 MG tablet ?Commonly known as: NORCO/VICODIN ?Take 2 tablets by mouth every 6 (six) hours as needed for moderate pain. ?  ?isosorbide mononitrate 30 MG 24 hr tablet ?Commonly known as: IMDUR ?Take 1 tablet (30 mg total) by mouth daily. ?Start taking on: October 09, 2021 ?What changed: when to  take this ?  ?Levemir FlexTouch 100 UNIT/ML FlexPen ?Generic drug: insulin detemir ?Inject 15 Units into the skin at bedtime. ?What changed: how much to take ?  ?losartan 50 MG tablet ?Commonly known as: COZAAR ?Take 1 tablet (

## 2021-10-08 NOTE — Progress Notes (Signed)
[facility] [name], [name] ?10/08/21 ? ?Subjective:  ? ?LOS: 7 ? ?Patient seen resting in bed, quietly staring out window ?Alert and oriented ?Complains of left arm soreness ? ?Objective:  ?Vital signs in last 24 hours:  ?Temp:  [97.9 ?F (36.6 ?C)-98.7 ?F (37.1 ?C)] 98 ?F (36.7 ?C) (04/22 2956) ?Pulse Rate:  [72-83] 74 (04/22 0816) ?Resp:  [0-20] 18 (04/22 0816) ?BP: (116-202)/(62-89) 157/78 (04/22 2130) ?SpO2:  [95 %-100 %] 98 % (04/22 0816) ?Weight:  [64.5 kg] 64.5 kg (04/21 1300) ? ?Weight change:  ?Filed Weights  ? 10/05/21 1337 10/07/21 0950 10/07/21 1300  ?Weight: 66.1 kg 60.6 kg 64.5 kg  ? ? ?Intake/Output: ?  ? ?Intake/Output Summary (Last 24 hours) at 10/08/2021 1047 ?Last data filed at 10/08/2021 1021 ?Gross per 24 hour  ?Intake 1304 ml  ?Output 500 ml  ?Net 804 ml  ? ? ? ?Physical Exam: ?General: NAD, laying in bed  ?HEENT  anicteric, moist oral mucous membrane  ?Pulm/lungs  normal breathing effort, lungs are clear to auscultation  ?CVS/Heart  regular rhythm, no rub or gallop  ?Abdomen:   Soft, nontender  ?Extremities:  No peripheral edema, left hand warm, non pitting left arm edema  ?Neurologic:  Alert, oriented, able to follow commands  ?Skin:  No acute rashes  ?Right IJ tunneled dialysis catheter ? ? ? ?Basic Metabolic Panel: ? ?Recent Labs  ?Lab 10/01/21 ?1937 10/03/21 ?0506 10/06/21 ?8657 10/08/21 ?0559  ?NA 133* 135 135 135  ?K 4.4 4.3 4.4 4.0  ?CL 96* 99 100 99  ?CO2 28 25 28 30   ?GLUCOSE 515* 241* 167* 113*  ?BUN 45* 58* 33* 23*  ?CREATININE 4.06* 5.37* 4.61* 3.72*  ?CALCIUM 9.0 8.8* 8.0* 8.5*  ?PHOS  --   --  4.7*  --   ? ? ? ? ?CBC: ?Recent Labs  ?Lab 10/01/21 ?1937 10/03/21 ?0506 10/05/21 ?1127 10/06/21 ?8469  ?WBC 5.4 6.7 5.7 5.2  ?NEUTROABS 4.1  --   --   --   ?HGB 10.2* 11.1* 10.0* 10.3*  ?HCT 31.6* 33.8* 30.8* 31.1*  ?MCV 88.3 88.5 88.3 85.7  ?PLT 176 176 163 189  ? ? ? ?  ?Lab Results  ?Component Value Date  ? HEPBSAG NON REACTIVE 10/05/2021  ? HEPBSAB NON REACTIVE  10/05/2021  ? ? ? ? ?Microbiology: ? ?Recent Results (from the past 240 hour(s))  ?Culture, blood (Routine X 2) w Reflex to ID Panel     Status: None  ? Collection Time: 10/01/21 10:29 PM  ? Specimen: BLOOD  ?Result Value Ref Range Status  ? Specimen Description BLOOD BLOOD RIGHT ARM  Final  ? Special Requests   Final  ?  BOTTLES DRAWN AEROBIC AND ANAEROBIC Blood Culture adequate volume  ? Culture   Final  ?  NO GROWTH 5 DAYS ?Performed at [facility], [address] [place],  [address] ?  ? Report Status 10/06/2021 FINAL  Final  ?MRSA Next Gen by PCR, Nasal     Status: None  ? Collection Time: 10/02/21  1:04 AM  ? Specimen: Nasal Mucosa; Nasal Swab  ?Result Value Ref Range Status  ? MRSA by PCR Next Gen NOT DETECTED NOT DETECTED Final  ?  Comment: (NOTE) ?The GeneXpert MRSA Assay (FDA approved for NASAL specimens only), ?is one component of a comprehensive MRSA colonization surveillance ?program. It is not intended to diagnose MRSA infection nor to guide ?or monitor treatment for MRSA infections. ?Test performance is not FDA approved in  patients less than 2 years ?old. ?Performed at [facility], [place], ?[name] [address] ?  ?Culture, blood (Routine X 2) w Reflex to ID Panel     Status: None  ? Collection Time: 10/02/21 10:28 PM  ? Specimen: BLOOD  ?Result Value Ref Range Status  ? Specimen Description BLOOD RIGHT HAND  Final  ? Special Requests   Final  ?  BOTTLES DRAWN AEROBIC AND ANAEROBIC Blood Culture adequate volume  ? Culture   Final  ?  NO GROWTH 5 DAYS ?Performed at [facility], [address] [place], [place] [address] ?  ? Report Status 10/07/2021 FINAL  Final  ? ? ?Coagulation Studies: ?No results for input(s): LABPROT, INR in the last 72 hours. ? ?Urinalysis: ?No results for input(s): COLORURINE, LABSPEC, Echo, GLUCOSEU, HGBUR, BILIRUBINUR, KETONESUR, PROTEINUR, UROBILINOGEN, NITRITE, LEUKOCYTESUR in the last 72 hours. ? ?Invalid input(s):  APPERANCEUR  ? ? ?Imaging: ?CT ABDOMEN PELVIS WO CONTRAST ? ?Result Date: 10/07/2021 ?CLINICAL DATA:  Abdominal pain, acute nonlocalized EXAM: CT ABDOMEN AND PELVIS WITHOUT CONTRAST TECHNIQUE: Multidetector CT imaging of the abdomen and pelvis was performed following the standard protocol without IV contrast. RADIATION DOSE REDUCTION: This exam was performed according to the departmental dose-optimization program which includes automated exposure control, adjustment of the mA and/or kV according to patient size and/or use of iterative reconstruction technique. COMPARISON:  None. FINDINGS: Lower chest: Lung bases are clear. Coronary artery calcification and aortic atherosclerotic calcification. Hepatobiliary: No focal hepatic lesion. No biliary duct dilatation. Common bile duct is normal. Pancreas: Pancreas is normal. No ductal dilatation. No pancreatic inflammation. Spleen: Normal spleen Adrenals/urinary tract: Adrenal glands and kidneys are normal. The ureters and bladder normal. Stomach/Bowel: Stomach, small bowel, and cecum are normal. The colon and rectosigmoid colon are normal. Vascular/Lymphatic: Abdominal aorta is normal caliber with atherosclerotic calcification. There is no retroperitoneal or periportal lymphadenopathy. No pelvic lymphadenopathy. Reproductive: Prostate unremarkable Other: No free fluid. Musculoskeletal: No aggressive osseous lesion. IMPRESSION: 1. No acute findings in the abdomen pelvis. 2.  Aortic Atherosclerosis (ICD10-I70.0). Electronically Signed   By: Suzy Bouchard M.D.   On: 10/07/2021 16:14  ? ?DG Abd Portable 1V ? ?Result Date: 10/07/2021 ?CLINICAL DATA:  Abdominal pain EXAM: PORTABLE ABDOMEN - 1 VIEW COMPARISON:  None. FINDINGS: Bowel gas pattern is nonspecific. Small amount of stool is seen in colon. There is no fecal impaction in the rectosigmoid. No abnormal masses or calcifications are seen. Kidneys are partly obscured by bowel contents. Degenerative changes are noted in the  lumbar spine. There is deformity in the neck of right femur, possibly residual from previous injury. There is no demonstrable break in the cortical margins. Vascular calcifications are seen in the soft tissues. IMPRESSION: Nonspecific bowel gas pattern. No abnormal masses or calcifications are seen. Electronically Signed   By: Elmer Picker M.D.   On: 10/07/2021 14:29   ? ? ?Medications:  ? ? ? ? apixaban  5 mg Oral BID  ? carvedilol  25 mg Oral BID  ? Chlorhexidine Gluconate Cloth  6 each Topical Daily  ? epoetin (EPOGEN/PROCRIT) injection  4,000 Units Intravenous Q M,W,F-HD  ? escitalopram  5 mg Oral QHS  ? ezetimibe  10 mg Oral Daily  ? feeding supplement (NEPRO CARB STEADY)  237 mL Oral TID BM  ? hydrALAZINE  100 mg Oral Q8H  ? insulin aspart  0-9 Units Subcutaneous Q4H  ? insulin aspart  5 Units Subcutaneous TID WC  ? insulin detemir  12 Units Subcutaneous Daily  ? isosorbide mononitrate  30 mg Oral Daily  ? losartan  50 mg Oral QHS  ? multivitamin  1 tablet Oral QHS  ? pantoprazole (PROTONIX) IV  40 mg Intravenous Q12H  ? rosuvastatin  10 mg Oral Daily  ? senna-docusate  2 tablet Oral BID  ? ?acetaminophen, albuterol, alum & mag hydroxide-simeth, diphenhydrAMINE, HYDROcodone-acetaminophen, labetalol, morphine injection, nitroGLYCERIN, nitroGLYCERIN, ondansetron (ZOFRAN) IV ? ?Assessment/ Plan:  ?61 y.o. male with  was admitted on 10/01/2021 for ? ?Principal Problem: ?  Diffuse pain in left upper extremity ?Active Problems: ?  Hypertensive emergency ?  Type 2 diabetes mellitus with hyperlipidemia ([place]) ?  HFrEF (heart failure with reduced ejection fraction) ([place]) ?  Coronary artery disease involving native coronary artery of native heart without angina pectoris ?  ESRD on hemodialysis ([place]) ?  History of DVT (deep vein thrombosis) ?  Chest pain ?  Steal syndrome as complication of dialysis access [facility]) ?  Left upper extremity swelling ?  Depression ?  Stomach upset ?  Abdominal pain ?  Malnutrition of  moderate degree ? ?Hyperglycemia [R73.9] ?ESRD (end stage renal disease) ([place]) [N18.6] ?NSTEMI (non-ST elevated myocardial infarction) ([place]) [I21.4] ?Hypertensive emergency [I16.1] ?Diffuse pain in left upper e

## 2021-10-08 NOTE — Consult Note (Signed)
? ? ? ?Cephas Darby, MD ?[address]  ?[address]  ?[place], [place] [address]  ?Main: [phone]  ?Fax: [phone] ?Pager: [phone] ? ? Consultation ? ?Referring Provider:     No ref. provider found ?Primary Care Physician:  [facility] ?Primary Gastroenterologist: Althia Forts        ?Reason for Consultation:     Abdominal pain ? ?Date of Admission:  10/01/2021 ?Date of Consultation:  10/08/2021 ?       ? HPI:   ?Jimmy Olson is a 61 y.o. male multiple comorbidities as listed below, medical history significant of diabetes mellitus type 2, end-stage renal disease on CAD, history of DVT presenting with left arm pain swelling and numbness x1 day admitted for left steal syndrome. ?Patient is also evaluated by neurology for left arm weakness, underwent MRI brain which has been negative for acute stroke.  Patient reported abdominal pain, was having diarrhea secondary to laxatives given as inpatient.  Reports has cramping pain and sometimes sharp, underwent CT abdomen without contrast as well as x-ray KUB which were unremarkable.  Therefore, GI is consulted for further evaluation.  When I saw the patient, he reports that he had a bowel movement yesterday and pain is somewhat better today, also has history of reflux and he does not take any medication at home.  He also reports irregular bowel habits and takes laxatives as needed.  Patient's wife and son are at bedside.  Patient is interested to undergo endoscopic evaluation ? ?NSAIDs: None ? ?Antiplts/Anticoagulants/Anti thrombotics: Eliquis for history of DVT, TIA ? ?GI Procedures: None ? ?Past Medical History:  ?Diagnosis Date  ? Anemia   ? Anxiety and depression   ? ATN (acute tubular necrosis) (HCC)   ? CAD (coronary artery disease)   ? a.) R/LHC 07/19/2020: 20% pRCA, 30% mRCA, 50% dRCA, 50% RPAV, 40% pLAD, 70% D1, 60% mLAD; LVEDP 19 mmHg; PA 37/16 (26 mmHg); PCWP 13 mmHg. b.) LHC 06/03/2021: EF 45-50%; LVEDP 18 mmHg; 40% pLAD,  60% mLAD, 20% pRCA, 30% mRCA, 70% D1, 30% dRCA, 40% RPDA.  ? Chronic HFrEF (heart failure with reduced ejection fraction) ([place])   ? a.) 06/2020 Echo: EF 30-35%, glob HK, GrII DD, nl RV fxn. Mild LAE. b.)  TTE 08/08/2021: EF 40-45%; global HK; mild LVH; GLS -8.6; mild LA dilation.  ? DVT of axillary vein, acute left ([place]) 05/2021  ? a.) Tx'd with apixaban  ? ESRD (end stage renal disease) on dialysis [facility])   ? a.) M-W-F  ? History of 2019 novel coronavirus disease (COVID-19) 03/06/2020  ? a.) s/p Tx with monoclonal Ab infusion  ? HLD (hyperlipidemia)   ? Hyperkalemia 12/23/2020  ? Hypertension   ? Left carotid artery stenosis 08/18/2021  ? a.) Carotid Doppler 36/14/4315: 40-08% LICA stenosis  ? Long term current use of anticoagulant   ? a.) apixaban for DVT  ? NICM (nonischemic cardiomyopathy) ([place])   ? a.) 07/2009 MV: EF 48%; b.) 08/2019 Echo: EF 45-50%. Global HK. Mod LVH. GrIDD; c.) 06/2020 Echo: EF 30-35%, glob HK, mild LVH, GrII DD, nl RV fxn, mild LAE; d.) 06/2020 Cath: nonobs dzs. e.)  LHC 06/03/2021; EF 45-50%; LVEDP 18 mmHg; nonobstructive CAD. f.) b.)  TTE 08/08/2021: EF 40-45%; global HK; mild LVH; GLS -8.6; mild LA dilation.  ? NSTEMI (non-ST elevated myocardial infarction) ([place]) 07/15/2020  ? a.) hsTnI trended: 124--> 127--> 176 --> 190 --> 200 ng/L.  ? Sepsis ([place])   ? Suicidal ideations   ?  TIA (transient ischemic attack) 06/2021  ? Type 2 diabetes mellitus treated with insulin ([place])   ? ? ?Past Surgical History:  ?Procedure Laterality Date  ? APPENDECTOMY    ? AV FISTULA PLACEMENT Left 09/29/2021  ? Procedure: ARTERIOVENOUS (AV) FISTULA CREATION ( RADIAL CEPHALIC);  Surgeon: Algernon Huxley, MD;  Location: [facility] ORS;  Service: Vascular;  Laterality: Left;  ? CARDIAC CATHETERIZATION    ? DIALYSIS/PERMA CATHETER INSERTION N/A 06/02/2021  ? Procedure: DIALYSIS/PERMA CATHETER INSERTION;  Surgeon: Algernon Huxley, MD;  Location: [place] [facility];  Service: Cardiovascular;  Laterality: N/A;  ? LEFT HEART CATH AND  CORONARY ANGIOGRAPHY N/A 06/03/2021  ? Procedure: LEFT HEART CATH AND CORONARY ANGIOGRAPHY;  Surgeon: Wellington Hampshire, MD;  Location: [place] [facility];  Service: Cardiovascular;  Laterality: N/A;  ? LIGATION OF ARTERIOVENOUS  FISTULA Left 10/02/2021  ? Procedure: LIGATION OF ARTERIOVENOUS  FISTULA;  Surgeon: Evaristo Bury, MD;  Location: [facility] OR[facility];  Service: Vascular;  Laterality: Left;  ? NASAL SINUS SURGERY    ? RIGHT/LEFT HEART CATH AND CORONARY ANGIOGRAPHY N/A 07/19/2020  ? Procedure: RIGHT/LEFT HEART CATH AND CORONARY ANGIOGRAPHY;  Surgeon: Wellington Hampshire, MD;  Location: [place] [facility];  Service: Cardiovascular;  Laterality: N/A;  ? ?No current facility-administered medications for this encounter. ? ?Current Outpatient Medications:  ?  albuterol (PROVENTIL HFA) 108 (90 Base) MCG/ACT inhaler, INHALE 2 PUFFS INTO THE LUNGS ONCE EVERY 6 HOURS AS NEEDED FOR WHEEZING OR SHORTNESS OF BREATH., Disp: 6.7 g, Rfl: 10 ?  apixaban (ELIQUIS) 5 MG TABS tablet, TAKE 1 TABLET BY MOUTH TWICE DAILY. (Patient taking differently: Take 5 mg by mouth daily.), Disp: 180 tablet, Rfl: 1 ?  carvedilol (COREG) 25 MG tablet, Take 1 tablet (25 mg total) by mouth 2 (two) times daily., Disp: 60 tablet, Rfl: 6 ?  ezetimibe (ZETIA) 10 MG tablet, Take 10 mg by mouth daily., Disp: , Rfl:  ?  HUMALOG KWIKPEN 100 UNIT/ML KwikPen, Inject into the skin 3 (three) times daily. Sliding scale, Disp: , Rfl:  ?  hydrALAZINE (APRESOLINE) 100 MG tablet, Take 1 tablet (100 mg total) by mouth once every 8 (eight) hours. (Patient taking differently: Take 100 mg by mouth 2 (two) times daily.), Disp: 90 tablet, Rfl: 6 ?  HYDROcodone-acetaminophen (NORCO/VICODIN) 5-325 MG tablet, Take 2 tablets by mouth every 6 (six) hours as needed for moderate pain., Disp: 20 tablet, Rfl: 0 ?  Insulin Pen Needle 32G X 4 MM MISC, USE AS DIRECTED, Disp: 100 each, Rfl: 0 ?  nitroGLYCERIN (NITROSTAT) 0.4 MG SL tablet, Place 1 tablet (0.4 mg total) under the tongue  every 5 (five) minutes as needed for chest pain., Disp: 30 tablet, Rfl: 0 ?  torsemide (DEMADEX) 20 MG tablet, Take 20 mg by mouth daily as needed., Disp: , Rfl:  ?  escitalopram (LEXAPRO) 5 MG tablet, Take 1 tablet (5 mg total) by mouth at bedtime., Disp: 30 tablet, Rfl: 2 ?  insulin detemir (LEVEMIR FLEXTOUCH) 100 UNIT/ML FlexPen, Inject 15 Units into the skin at bedtime., Disp: 4.5 mL, Rfl: 2 ?  [START ON 10/09/2021] isosorbide mononitrate (IMDUR) 30 MG 24 hr tablet, Take 1 tablet (30 mg total) by mouth daily., Disp: 30 tablet, Rfl: 1 ?  losartan (COZAAR) 50 MG tablet, Take 1 tablet (50 mg total) by mouth at bedtime., Disp: 30 tablet, Rfl: 2 ?  multivitamin (RENA-VIT) TABS tablet, Take 1 tablet by mouth at bedtime., Disp: 30 tablet, Rfl: 2 ?  Nutritional  Supplements (FEEDING SUPPLEMENT, NEPRO CARB STEADY,) LIQD, Take 237 mLs by mouth 3 (three) times daily between meals., Disp: , Rfl: 0 ?  [START ON 10/09/2021] rosuvastatin (CRESTOR) 10 MG tablet, Take 1 tablet (10 mg total) by mouth daily., Disp: 30 tablet, Rfl: 1 ? ? ? ?Family History  ?Problem Relation Age of Onset  ? Kidney failure Mother   ?     died @ 65  ? Heart failure Mother   ? Other Father   ?     he never knew his father  ? Diabetes Brother   ?  ? ?Social History  ? ?Tobacco Use  ? Smoking status: Never  ? Smokeless tobacco: Never  ?Vaping Use  ? Vaping Use: Never used  ?Substance Use Topics  ? Alcohol use: Not Currently  ? Drug use: Never  ? ? ?Allergies as of 10/01/2021  ? (No Known Allergies)  ? ? ?Review of Systems:    ?All systems reviewed and negative except where noted in HPI. ? ? Physical Exam:  ?Vital signs in last 24 hours: ?Temp:  [98 ?F (36.7 ?C)-98.7 ?F (37.1 ?C)] 98 ?F (36.7 ?C) (04/22 8563) ?Pulse Rate:  [72-74] 74 (04/22 0816) ?Resp:  [18-20] 18 (04/22 0816) ?BP: (140-157)/(78-81) 157/78 (04/22 0816) ?SpO2:  [97 %-100 %] 98 % (04/22 0816) ?Last BM Date : 10/07/21 ?General:   Pleasant, cooperative in NAD ?Head:  Normocephalic and  atraumatic. ?Eyes:   No icterus.   Conjunctiva pink. PERRLA. ?Ears:  Normal auditory acuity. ?Neck:  Supple; no masses or thyroidomegaly ?Lungs: Respirations even and unlabored. Lungs clear to auscultation bilateral

## 2021-10-10 ENCOUNTER — Other Ambulatory Visit: Payer: Self-pay

## 2021-10-10 MED ORDER — LEVEMIR FLEXPEN 100 UNIT/ML ~~LOC~~ SOPN
20.0000 [IU] | PEN_INJECTOR | Freq: Every day | SUBCUTANEOUS | 5 refills | Status: DC
  Filled 2021-10-10: qty 30, 150d supply, fill #0

## 2021-10-11 ENCOUNTER — Other Ambulatory Visit: Payer: Self-pay

## 2021-10-13 ENCOUNTER — Other Ambulatory Visit: Payer: Self-pay

## 2021-10-20 ENCOUNTER — Other Ambulatory Visit: Payer: Self-pay

## 2021-10-24 ENCOUNTER — Other Ambulatory Visit: Payer: Self-pay

## 2021-10-24 ENCOUNTER — Encounter: Payer: Self-pay | Admitting: Vascular Surgery

## 2021-10-24 MED ORDER — ISOSORBIDE MONONITRATE ER 30 MG PO TB24
30.0000 mg | ORAL_TABLET | Freq: Two times a day (BID) | ORAL | 6 refills | Status: DC
  Filled 2021-10-24: qty 60, 30d supply, fill #0

## 2021-10-25 ENCOUNTER — Other Ambulatory Visit: Payer: Self-pay

## 2021-10-26 ENCOUNTER — Other Ambulatory Visit: Payer: Self-pay

## 2021-10-28 ENCOUNTER — Other Ambulatory Visit: Payer: Self-pay | Admitting: Cardiovascular Disease

## 2021-10-28 DIAGNOSIS — I6523 Occlusion and stenosis of bilateral carotid arteries: Secondary | ICD-10-CM

## 2021-10-31 ENCOUNTER — Other Ambulatory Visit: Payer: Self-pay

## 2021-11-15 ENCOUNTER — Other Ambulatory Visit (INDEPENDENT_AMBULATORY_CARE_PROVIDER_SITE_OTHER): Payer: Self-pay | Admitting: Vascular Surgery

## 2021-11-15 ENCOUNTER — Encounter (INDEPENDENT_AMBULATORY_CARE_PROVIDER_SITE_OTHER): Payer: Medicaid Other

## 2021-11-15 ENCOUNTER — Encounter (INDEPENDENT_AMBULATORY_CARE_PROVIDER_SITE_OTHER): Payer: Self-pay | Admitting: Nurse Practitioner

## 2021-11-15 ENCOUNTER — Ambulatory Visit (INDEPENDENT_AMBULATORY_CARE_PROVIDER_SITE_OTHER): Payer: Medicare Other | Admitting: Nurse Practitioner

## 2021-11-15 VITALS — BP 210/100 | HR 78 | Resp 18 | Ht 66.0 in | Wt 151.0 lb

## 2021-11-15 DIAGNOSIS — N186 End stage renal disease: Secondary | ICD-10-CM

## 2021-11-15 DIAGNOSIS — E1169 Type 2 diabetes mellitus with other specified complication: Secondary | ICD-10-CM

## 2021-11-15 DIAGNOSIS — I152 Hypertension secondary to endocrine disorders: Secondary | ICD-10-CM

## 2021-11-15 DIAGNOSIS — E785 Hyperlipidemia, unspecified: Secondary | ICD-10-CM

## 2021-11-15 DIAGNOSIS — E1159 Type 2 diabetes mellitus with other circulatory complications: Secondary | ICD-10-CM

## 2021-11-18 ENCOUNTER — Other Ambulatory Visit: Payer: Self-pay

## 2021-11-23 ENCOUNTER — Telehealth (INDEPENDENT_AMBULATORY_CARE_PROVIDER_SITE_OTHER): Payer: Self-pay

## 2021-11-23 NOTE — Telephone Encounter (Signed)
I attempted to contact the patient to schedule him for a right brachial cephalic AVF with Dr. Lucky Cowboy. A message was left with the patient's daughter for a return call as the patient's voicemail box was not set up.

## 2021-11-24 ENCOUNTER — Encounter (INDEPENDENT_AMBULATORY_CARE_PROVIDER_SITE_OTHER): Payer: Self-pay | Admitting: Nurse Practitioner

## 2021-11-24 ENCOUNTER — Other Ambulatory Visit (INDEPENDENT_AMBULATORY_CARE_PROVIDER_SITE_OTHER): Payer: Self-pay | Admitting: Nurse Practitioner

## 2021-11-24 DIAGNOSIS — N186 End stage renal disease: Secondary | ICD-10-CM

## 2021-11-24 NOTE — Telephone Encounter (Signed)
Jimmy Olson nurse called to get the patient scheduled for surgery with Dr. Lucky Cowboy. Per the patient and the nurse the patient will be scheduled on 12/15/21 at [facility] for a right brachial cephalic AV fistula. Pre-op phone call is on 12/06/21 between 8-1 pm. Pre-surgical instructions will be mailed to the patient.

## 2021-11-24 NOTE — Progress Notes (Signed)
Subjective:    Patient ID: Jimmy Olson, male    DOB: 1960-11-05, 61 y.o.   MRN: [id number] No chief complaint on file.   The patient returns today for follow-up evaluation for permanent dialysis access.  The patient previously had a left radiocephalic AV fistula however he developed severe steal symptoms which required ligation of his fistula.  He still continues to have some numbness and discomfort in his left hand.  Previous vein mapping that the patient had showed adequate access for a right brachiocephalic AV fistula.   Review of Systems  Neurological:  Positive for weakness and numbness.  All other systems reviewed and are negative.     Objective:   Physical Exam Vitals reviewed.  HENT:     Head: Normocephalic.  Cardiovascular:     Rate and Rhythm: Normal rate.     Pulses: Normal pulses.  Pulmonary:     Effort: Pulmonary effort is normal.  Skin:    General: Skin is warm and dry.  Neurological:     Mental Status: He is alert and oriented to person, place, and time.  Psychiatric:        Mood and Affect: Mood normal.        Behavior: Behavior normal.        Thought Content: Thought content normal.        Judgment: Judgment normal.    BP (!) 210/100 (BP Location: Right Arm)   Pulse 78   Resp 18   Ht 5\' 6"  (1.676 m)   Wt 151 lb (68.5 kg)   BMI 24.37 kg/m   Past Medical History:  Diagnosis Date   Anemia    Anxiety and depression    ATN (acute tubular necrosis) (HCC)    CAD (coronary artery disease)    a.) R/LHC 07/19/2020: 20% pRCA, 30% mRCA, 50% dRCA, 50% RPAV, 40% pLAD, 70% D1, 60% mLAD; LVEDP 19 mmHg; PA 37/16 (26 mmHg); PCWP 13 mmHg. b.) LHC 06/03/2021: EF 45-50%; LVEDP 18 mmHg; 40% pLAD, 60% mLAD, 20% pRCA, 30% mRCA, 70% D1, 30% dRCA, 40% RPDA.   Chronic HFrEF (heart failure with reduced ejection fraction) ([place])    a.) 06/2020 Echo: EF 30-35%, glob HK, GrII DD, nl RV fxn. Mild LAE. b.)  TTE 08/08/2021: EF 40-45%; global HK; mild LVH; GLS -8.6; mild LA  dilation.   DVT of axillary vein, acute left ([place]) 05/2021   a.) Tx'd with apixaban   ESRD (end stage renal disease) on dialysis [facility])    a.) M-W-F   History of 2019 novel coronavirus disease (COVID-19) 03/06/2020   a.) s/p Tx with monoclonal Ab infusion   HLD (hyperlipidemia)    Hyperkalemia 12/23/2020   Hypertension    Left carotid artery stenosis 08/18/2021   a.) Carotid Doppler 44/08/4740: 59-56% LICA stenosis   Long term current use of anticoagulant    a.) apixaban for DVT   NICM (nonischemic cardiomyopathy) ([place])    a.) 07/2009 MV: EF 48%; b.) 08/2019 Echo: EF 45-50%. Global HK. Mod LVH. GrIDD; c.) 06/2020 Echo: EF 30-35%, glob HK, mild LVH, GrII DD, nl RV fxn, mild LAE; d.) 06/2020 Cath: nonobs dzs. e.)  LHC 06/03/2021; EF 45-50%; LVEDP 18 mmHg; nonobstructive CAD. f.) b.)  TTE 08/08/2021: EF 40-45%; global HK; mild LVH; GLS -8.6; mild LA dilation.   NSTEMI (non-ST elevated myocardial infarction) ([place]) 07/15/2020   a.) hsTnI trended: 124--> 127--> 176 --> 190 --> 200 ng/L.   Sepsis ([place])    Suicidal ideations  TIA (transient ischemic attack) 06/2021   Type 2 diabetes mellitus treated with insulin (HCC)     Social History   Socioeconomic History   Marital status: Married    Spouse name: Ana   Number of children: 7   Years of education: Not on file   Highest education level: Not on file  Occupational History   Not on file  Tobacco Use   Smoking status: Never   Smokeless tobacco: Never  Vaping Use   Vaping Use: Never used  Substance and Sexual Activity   Alcohol use: Not Currently   Drug use: Never   Sexual activity: Not on file  Other Topics Concern   Not on file  Social History Narrative   Lives locally with wife and son.  He is currently unemployed - has worked in different industries.     Social Determinants of Health   Financial Resource Strain: Not on file  Food Insecurity: Not on file  Transportation Needs: Not on file  Physical Activity: Not on file   Stress: Not on file  Social Connections: Not on file  Intimate Partner Violence: Not on file    Past Surgical History:  Procedure Laterality Date   APPENDECTOMY     AV FISTULA PLACEMENT Left 09/29/2021   Procedure: ARTERIOVENOUS (AV) FISTULA CREATION ( RADIAL CEPHALIC);  Surgeon: Algernon Huxley, MD;  Location: [facility] ORS;  Service: Vascular;  Laterality: Left;   CARDIAC CATHETERIZATION     DIALYSIS/PERMA CATHETER INSERTION N/A 06/02/2021   Procedure: DIALYSIS/PERMA CATHETER INSERTION;  Surgeon: Algernon Huxley, MD;  Location: [place] [facility];  Service: Cardiovascular;  Laterality: N/A;   LEFT HEART CATH AND CORONARY ANGIOGRAPHY N/A 06/03/2021   Procedure: LEFT HEART CATH AND CORONARY ANGIOGRAPHY;  Surgeon: Wellington Hampshire, MD;  Location: [place] [facility];  Service: Cardiovascular;  Laterality: N/A;   LIGATION OF ARTERIOVENOUS  FISTULA Left 10/02/2021   Procedure: LIGATION OF ARTERIOVENOUS  FISTULA;  Surgeon: Evaristo Bury, MD;  Location: [facility];  Service: Vascular;  Laterality: Left;   NASAL SINUS SURGERY     RIGHT/LEFT HEART CATH AND CORONARY ANGIOGRAPHY N/A 07/19/2020   Procedure: RIGHT/LEFT HEART CATH AND CORONARY ANGIOGRAPHY;  Surgeon: Wellington Hampshire, MD;  Location: [place] [facility];  Service: Cardiovascular;  Laterality: N/A;    Family History  Problem Relation Age of Onset   Kidney failure Mother        died @ 86   Heart failure Mother    Other Father        he never knew his father   Diabetes Brother     No Known Allergies     Latest Ref Rng & Units 10/06/2021    3:46 AM 10/05/2021   11:27 AM 10/03/2021    5:06 AM  CBC  WBC 4.0 - 10.5 K/uL 5.2   5.7   6.7    Hemoglobin 13.0 - 17.0 g/dL 10.3   10.0   11.1    Hematocrit 39.0 - 52.0 % 31.1   30.8   33.8    Platelets 150 - 400 K/uL 189   163   176        CMP     Component Value Date/Time   NA 135 10/08/2021 0559   NA 141 12/21/2020 1023   K 4.0 10/08/2021 0559   CL 99 10/08/2021 0559   CO2 30  10/08/2021 0559   GLUCOSE 113 (H) 10/08/2021 0559   BUN 23 (H) 10/08/2021 0559  BUN 51 (H) 12/21/2020 1023   CREATININE 3.72 (H) 10/08/2021 0559   CALCIUM 8.5 (L) 10/08/2021 0559   PROT 6.2 (L) 10/08/2021 0559   ALBUMIN 2.8 (L) 10/08/2021 0559   AST 26 10/08/2021 0559   ALT 16 10/08/2021 0559   ALKPHOS 73 10/08/2021 0559   BILITOT 0.4 10/08/2021 0559   GFRNONAA 18 (L) 10/08/2021 0559   GFRAA 30 (L) 03/06/2020 1255     No results found.     Assessment & Plan:   1. ESRD (end stage renal disease) ([place]) Recommend:  At this time the patient does not have appropriate extremity access for dialysis  Patient should have a right brachiocephalic AV fistula created.  The risks, benefits and alternative therapies were reviewed in detail with the patient.  All questions were answered.  The patient agrees to proceed with surgery.   The patient will follow up with me in the office after the surgery.   2. Type 2 diabetes mellitus with hyperlipidemia ([place]) Continue hypoglycemic medications as already ordered, these medications have been reviewed and there are no changes at this time.  Hgb A1C to be monitored as already arranged by primary service   3. Hypertension associated with diabetes ([place]) Continue antihypertensive medications as already ordered, these medications have been reviewed and there are no changes at this time.    Current Outpatient Medications on File Prior to Visit  Medication Sig Dispense Refill   albuterol (PROVENTIL HFA) 108 (90 Base) MCG/ACT inhaler INHALE 2 PUFFS INTO THE LUNGS ONCE EVERY 6 HOURS AS NEEDED FOR WHEEZING OR SHORTNESS OF BREATH. 6.7 g 10   apixaban (ELIQUIS) 5 MG TABS tablet TAKE 1 TABLET BY MOUTH TWICE DAILY. (Patient taking differently: Take 5 mg by mouth daily.) 180 tablet 1   carvedilol (COREG) 25 MG tablet Take 1 tablet (25 mg total) by mouth 2 (two) times daily. 60 tablet 6   escitalopram (LEXAPRO) 5 MG tablet Take 1 tablet (5 mg total) by mouth  at bedtime. 30 tablet 2   ezetimibe (ZETIA) 10 MG tablet Take 10 mg by mouth daily.     HUMALOG KWIKPEN 100 UNIT/ML KwikPen Inject into the skin 3 (three) times daily. Sliding scale     hydrALAZINE (APRESOLINE) 100 MG tablet Take 1 tablet (100 mg total) by mouth once every 8 (eight) hours. (Patient taking differently: Take 100 mg by mouth 2 (two) times daily.) 90 tablet 6   HYDROcodone-acetaminophen (NORCO/VICODIN) 5-325 MG tablet Take 2 tablets by mouth every 6 (six) hours as needed for moderate pain. 20 tablet 0   insulin detemir (LEVEMIR FLEXPEN) 100 UNIT/ML FlexPen Inject 20 Units into the skin once daily at bedtime. 15 mL 5   insulin detemir (LEVEMIR FLEXTOUCH) 100 UNIT/ML FlexPen Inject 15 Units into the skin at bedtime. 4.5 mL 2   Insulin Pen Needle 32G X 4 MM MISC USE AS DIRECTED 100 each 0   isosorbide mononitrate (IMDUR) 30 MG 24 hr tablet Take 1 tablet (30 mg total) by mouth daily. 30 tablet 1   isosorbide mononitrate (IMDUR) 30 MG 24 hr tablet Take 1 tablet (30 mg total) by mouth in the morning and at bedtime. 60 tablet 6   losartan (COZAAR) 50 MG tablet Take 1 tablet (50 mg total) by mouth at bedtime. 30 tablet 2   multivitamin (RENA-VIT) TABS tablet Take 1 tablet by mouth at bedtime. 30 tablet 2   nitroGLYCERIN (NITROSTAT) 0.4 MG SL tablet Place 1 tablet (0.4 mg total) under the tongue every  5 (five) minutes as needed for chest pain. 30 tablet 0   Nutritional Supplements (FEEDING SUPPLEMENT, NEPRO CARB STEADY,) LIQD Take 237 mLs by mouth 3 (three) times daily between meals.  0   rosuvastatin (CRESTOR) 10 MG tablet Take 1 tablet (10 mg total) by mouth daily. 30 tablet 1   torsemide (DEMADEX) 20 MG tablet Take 20 mg by mouth daily as needed.     [DISCONTINUED] gabapentin (NEURONTIN) 300 MG capsule Take 1 capsule (300 mg total) by mouth at bedtime for 7 days. 7 capsule 0   [DISCONTINUED] isosorbide dinitrate (ISORDIL) 30 MG tablet TAKE 1 TABLET BY MOUTH ONCE DAILY. 90 tablet 1   No  current facility-administered medications on file prior to visit.    There are no Patient Instructions on file for this visit. No follow-ups on file.   Kris Hartmann, NP
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
## 2021-11-29 ENCOUNTER — Telehealth: Payer: Self-pay | Admitting: Cardiovascular Disease

## 2021-11-29 ENCOUNTER — Other Ambulatory Visit: Payer: Self-pay

## 2021-11-29 ENCOUNTER — Ambulatory Visit: Payer: Medicare Other | Attending: Family | Admitting: Family

## 2021-11-29 ENCOUNTER — Encounter: Payer: Self-pay | Admitting: Family

## 2021-11-29 VITALS — BP 144/72 | HR 90 | Resp 14 | Ht 66.0 in | Wt 150.0 lb

## 2021-11-29 DIAGNOSIS — I132 Hypertensive heart and chronic kidney disease with heart failure and with stage 5 chronic kidney disease, or end stage renal disease: Secondary | ICD-10-CM | POA: Insufficient documentation

## 2021-11-29 DIAGNOSIS — I6523 Occlusion and stenosis of bilateral carotid arteries: Secondary | ICD-10-CM | POA: Diagnosis not present

## 2021-11-29 DIAGNOSIS — E1122 Type 2 diabetes mellitus with diabetic chronic kidney disease: Secondary | ICD-10-CM | POA: Diagnosis not present

## 2021-11-29 DIAGNOSIS — N186 End stage renal disease: Secondary | ICD-10-CM | POA: Diagnosis not present

## 2021-11-29 DIAGNOSIS — R079 Chest pain, unspecified: Secondary | ICD-10-CM | POA: Diagnosis not present

## 2021-11-29 DIAGNOSIS — R0602 Shortness of breath: Secondary | ICD-10-CM | POA: Insufficient documentation

## 2021-11-29 DIAGNOSIS — M549 Dorsalgia, unspecified: Secondary | ICD-10-CM | POA: Diagnosis not present

## 2021-11-29 DIAGNOSIS — Z794 Long term (current) use of insulin: Secondary | ICD-10-CM

## 2021-11-29 DIAGNOSIS — I272 Pulmonary hypertension, unspecified: Secondary | ICD-10-CM | POA: Insufficient documentation

## 2021-11-29 DIAGNOSIS — R42 Dizziness and giddiness: Secondary | ICD-10-CM | POA: Diagnosis not present

## 2021-11-29 DIAGNOSIS — G8929 Other chronic pain: Secondary | ICD-10-CM | POA: Diagnosis not present

## 2021-11-29 DIAGNOSIS — I5022 Chronic systolic (congestive) heart failure: Secondary | ICD-10-CM

## 2021-11-29 DIAGNOSIS — Z992 Dependence on renal dialysis: Secondary | ICD-10-CM | POA: Insufficient documentation

## 2021-11-29 DIAGNOSIS — I2510 Atherosclerotic heart disease of native coronary artery without angina pectoris: Secondary | ICD-10-CM | POA: Insufficient documentation

## 2021-11-29 DIAGNOSIS — I10 Essential (primary) hypertension: Secondary | ICD-10-CM | POA: Diagnosis not present

## 2021-11-29 LAB — GLUCOSE, CAPILLARY: Glucose-Capillary: 480 mg/dL — ABNORMAL HIGH (ref 70–99)

## 2021-11-29 MED ORDER — ISOSORBIDE MONONITRATE ER 30 MG PO TB24
30.0000 mg | ORAL_TABLET | Freq: Two times a day (BID) | ORAL | 3 refills | Status: DC
  Filled 2021-11-29: qty 90, 45d supply, fill #0
  Filled 2022-01-18: qty 90, 45d supply, fill #1
  Filled 2022-04-10: qty 90, 45d supply, fill #2
  Filled 2022-06-26: qty 90, 45d supply, fill #3

## 2021-11-29 MED ORDER — CARVEDILOL 25 MG PO TABS
25.0000 mg | ORAL_TABLET | Freq: Two times a day (BID) | ORAL | 3 refills | Status: DC
  Filled 2021-11-29 (×2): qty 180, 90d supply, fill #0
  Filled 2022-04-10: qty 180, 90d supply, fill #1

## 2021-11-29 NOTE — Telephone Encounter (Signed)
-----   Message from Horton Finer sent at 11/29/2021  3:11 PM EDT ----- Regarding: question RE: scheduled F/U appointment Will you please take a look to see if this patient actually needs the appt scheduled on 12/06/21 for any reason? He was just seen on 11/22/21 with 12 month F/U advised.  Thank you!

## 2021-11-29 NOTE — Patient Instructions (Addendum)
Continue weighing daily and call for an overnight weight gain of 3 pounds or more or a weekly weight gain of more than 5 pounds.   If you receive a satisfaction survey regarding the [facility], please take the time to fill it out. This way we can continue to provide excellent care and make any changes that need to be made.    Call us in the future for any questions or if you need to make another appointment.

## 2021-11-29 NOTE — Progress Notes (Signed)
Jimmy Olson - PHARMACIST COUNSELING NOTE  Guideline-Directed Medical Therapy/Evidence Based Medicine  ACE/ARB/ARNI: Losartan 50 mg daily Beta Blocker: Carvedilol 25 mg twice daily Aldosterone Antagonist: None Diuretic: Torsemide 20 mg daily (on Saturday and Sunday only) SGLT2i: None  Adherence Assessment  Do you ever forget to take your medication? [] Yes [x] No  Do you ever skip doses due to side effects? [] Yes [x] No  Do you have trouble affording your medicines? [] Yes [x] No  Are you ever unable to pick up your medication due to transportation difficulties? [] Yes [x] No  Do you ever stop taking your medications because you don't believe they are helping? [] Yes [x] No  Do you check your weight daily? [] Yes [x] No   Adherence strategy: maintains medications together  Barriers to obtaining medications: Spanish speaking requiring interpreter  Vital signs: HR 90, BP 144/72, weight (pounds) 150 lb ECHO: Date 08/11/2021, EF 40-45%, notes: global hypokinesis, mild left ventricular hypertrophy     Latest Ref Rng & Units 10/08/2021    5:59 AM 10/06/2021    3:46 AM 10/03/2021    5:06 AM  BMP  Glucose 70 - 99 mg/dL 113  167  241   BUN 6 - 20 mg/dL 23  33  58   Creatinine 0.61 - 1.24 mg/dL 3.72  4.61  5.37   Sodium 135 - 145 mmol/L 135  135  135   Potassium 3.5 - 5.1 mmol/L 4.0  4.4  4.3   Chloride 98 - 111 mmol/L 99  100  99   CO2 22 - 32 mmol/L 30  28  25    Calcium 8.9 - 10.3 mg/dL 8.5  8.0  8.8     Past Medical History:  Diagnosis Date   Anemia    Anxiety and depression    ATN (acute tubular necrosis) (HCC)    CAD (coronary artery disease)    a.) R/LHC 07/19/2020: 20% pRCA, 30% mRCA, 50% dRCA, 50% RPAV, 40% pLAD, 70% D1, 60% mLAD; LVEDP 19 mmHg; PA 37/16 (26 mmHg); PCWP 13 mmHg. b.) LHC 06/03/2021: EF 45-50%; LVEDP 18 mmHg; 40% pLAD, 60% mLAD, 20% pRCA, 30% mRCA, 70% D1, 30% dRCA, 40% RPDA.   Chronic HFrEF (heart failure with reduced  ejection fraction) ([place])    a.) 06/2020 Echo: EF 30-35%, glob HK, GrII DD, nl RV fxn. Mild LAE. b.)  TTE 08/08/2021: EF 40-45%; global HK; mild LVH; GLS -8.6; mild LA dilation.   DVT of axillary vein, acute left ([place]) 05/2021   a.) Tx'd with apixaban   ESRD (end stage renal disease) on dialysis [facility])    a.) M-W-F   History of 2019 novel coronavirus disease (COVID-19) 03/06/2020   a.) s/p Tx with monoclonal Ab infusion   HLD (hyperlipidemia)    Hyperkalemia 12/23/2020   Hypertension    Left carotid artery stenosis 08/18/2021   a.) Carotid Doppler 16/03/9603: 54-09% LICA stenosis   Long term current use of anticoagulant    a.) apixaban for DVT   NICM (nonischemic cardiomyopathy) ([place])    a.) 07/2009 MV: EF 48%; b.) 08/2019 Echo: EF 45-50%. Global HK. Mod LVH. GrIDD; c.) 06/2020 Echo: EF 30-35%, glob HK, mild LVH, GrII DD, nl RV fxn, mild LAE; d.) 06/2020 Cath: nonobs dzs. e.)  LHC 06/03/2021; EF 45-50%; LVEDP 18 mmHg; nonobstructive CAD. f.) b.)  TTE 08/08/2021: EF 40-45%; global HK; mild LVH; GLS -8.6; mild LA dilation.   NSTEMI (non-ST elevated myocardial infarction) ([place]) 07/15/2020   a.) hsTnI trended: 124--> 127--> 176 -->  190 --> 200 ng/L.   Sepsis ([place])    Suicidal ideations    TIA (transient ischemic attack) 06/2021   Type 2 diabetes mellitus treated with insulin [facility])     ASSESSMENT 61 year old male who presents to the HF clinic for management of HFrEF. PMH relevant to ESRD on HD MWF, type 2 diabetes mellitus, hypertension, and coronary artery disease. Sees nephrology and cardiology. On GDMT of carvedilol 25 mg twice daily, losartan 50 mg daily, isosorbide mononitrate 30 mg twice daily and hydralazine 100 mg three times daily. Planning for placement of AV fistula in right arm after failure of left AV fistula resulted in hospital admission with hypertensive emergency on 10/01/2021.   PLAN Reconciled medications  Time spent: 15 minutes  Glean Salvo, PharmD Pharmacy  Resident  11/29/2021 11:48 AM  Current Outpatient Medications:    acetaminophen (TYLENOL) 325 MG tablet, Take 650 mg by mouth every 6 (six) hours as needed., Disp: , Rfl:    albuterol (PROVENTIL HFA) 108 (90 Base) MCG/ACT inhaler, INHALE 2 PUFFS INTO THE LUNGS ONCE EVERY 6 HOURS AS NEEDED FOR WHEEZING OR SHORTNESS OF BREATH. (Patient taking differently: Inhale into the lungs. Needs ~2-3 times per day), Disp: 6.7 g, Rfl: 10   apixaban (ELIQUIS) 5 MG TABS tablet, TAKE 1 TABLET BY MOUTH TWICE DAILY. (Patient taking differently: Take 5 mg by mouth daily.), Disp: 180 tablet, Rfl: 1   carvedilol (COREG) 25 MG tablet, Take 1 tablet (25 mg total) by mouth 2 (two) times daily., Disp: 180 tablet, Rfl: 3   escitalopram (LEXAPRO) 5 MG tablet, Take 1 tablet (5 mg total) by mouth at bedtime., Disp: 30 tablet, Rfl: 2   ezetimibe (ZETIA) 10 MG tablet, Take 10 mg by mouth daily., Disp: , Rfl:    HUMALOG KWIKPEN 100 UNIT/ML KwikPen, Inject 3 Units into the skin 3 (three) times daily. Sliding scale, Disp: , Rfl:    hydrALAZINE (APRESOLINE) 100 MG tablet, Take 1 tablet (100 mg total) by mouth once every 8 (eight) hours. (Patient taking differently: Take 100 mg by mouth 2 (two) times daily.), Disp: 90 tablet, Rfl: 6   HYDROcodone-acetaminophen (NORCO/VICODIN) 5-325 MG tablet, Take 2 tablets by mouth every 6 (six) hours as needed for moderate pain. (Patient not taking: Reported on 11/29/2021), Disp: 20 tablet, Rfl: 0   insulin detemir (LEVEMIR FLEXPEN) 100 UNIT/ML FlexPen, Inject 20 Units into the skin once daily at bedtime. (Patient taking differently: Inject 25 Units into the skin at bedtime.), Disp: 15 mL, Rfl: 5   Insulin Pen Needle 32G X 4 MM MISC, USE AS DIRECTED, Disp: 100 each, Rfl: 0   isosorbide mononitrate (IMDUR) 30 MG 24 hr tablet, Take 1 tablet (30 mg total) by mouth in the morning and at bedtime., Disp: 90 tablet, Rfl: 3   losartan (COZAAR) 50 MG tablet, Take 1 tablet (50 mg total) by mouth at bedtime.,  Disp: 30 tablet, Rfl: 2   multivitamin (RENA-VIT) TABS tablet, Take 1 tablet by mouth at bedtime. (Patient not taking: Reported on 11/29/2021), Disp: 30 tablet, Rfl: 2   Naphazoline-Pheniramine (OPCON-A) 0.027-0.315 % SOLN, Apply 2 drops to eye at bedtime., Disp: , Rfl:    nitroGLYCERIN (NITROSTAT) 0.4 MG SL tablet, Place 1 tablet (0.4 mg total) under the tongue every 5 (five) minutes as needed for chest pain., Disp: 30 tablet, Rfl: 0   Nutritional Supplements (FEEDING SUPPLEMENT, NEPRO CARB STEADY,) LIQD, Take 237 mLs by mouth 3 (three) times daily between meals., Disp: , Rfl: 0  rosuvastatin (CRESTOR) 10 MG tablet, Take 1 tablet (10 mg total) by mouth daily., Disp: 30 tablet, Rfl: 1   torsemide (DEMADEX) 20 MG tablet, Take 20 mg by mouth daily as needed. Takes on Sat and Sun, Disp: , Rfl:    umeclidinium bromide (INCRUSE ELLIPTA) 62.5 MCG/ACT AEPB, Inhale 1 puff into the lungs 2 (two) times daily., Disp: , Rfl:    COUNSELING POINTS/CLINICAL PEARLS   DRUGS TO CAUTION IN HEART FAILURE  Drug or Class Mechanism  Analgesics NSAIDs COX-2 inhibitors Glucocorticoids  Sodium and water retention, increased systemic vascular resistance, decreased response to diuretics   Diabetes Medications Metformin Thiazolidinediones Rosiglitazone (Avandia) Pioglitazone (Actos) DPP4 Inhibitors Saxagliptin (Onglyza) Sitagliptin (Januvia)   Lactic acidosis Possible calcium channel blockade   Unknown  Antiarrhythmics Class I  Flecainide Disopyramide Class III Sotalol Other Dronedarone  Negative inotrope, proarrhythmic   Proarrhythmic, beta blockade  Negative inotrope  Antihypertensives Alpha Blockers Doxazosin Calcium Channel Blockers Diltiazem Verapamil Nifedipine Central Alpha Adrenergics Moxonidine Peripheral Vasodilators Minoxidil  Increases renin and aldosterone  Negative inotrope    Possible sympathetic withdrawal  Unknown  Anti-infective Itraconazole Amphotericin B   Negative inotrope Unknown  Hematologic Anagrelide Cilostazol   Possible inhibition of PD IV Inhibition of PD III causing arrhythmias  Neurologic/Psychiatric Stimulants Anti-Seizure Drugs Carbamazepine Pregabalin Antidepressants Tricyclics Citalopram Parkinsons Bromocriptine Pergolide Pramipexole Antipsychotics Clozapine Antimigraine Ergotamine Methysergide Appetite suppressants Bipolar Lithium  Peripheral alpha and beta agonist activity  Negative inotrope and chronotrope Calcium channel blockade  Negative inotrope, proarrhythmic Dose-dependent QT prolongation  Excessive serotonin activity/valvular damage Excessive serotonin activity/valvular damage Unknown  IgE mediated hypersensitivy, calcium channel blockade  Excessive serotonin activity/valvular damage Excessive serotonin activity/valvular damage Valvular damage  Direct myofibrillar degeneration, adrenergic stimulation  Antimalarials Chloroquine Hydroxychloroquine Intracellular inhibition of lysosomal enzymes  Urologic Agents Alpha Blockers Doxazosin Prazosin Tamsulosin Terazosin  Increased renin and aldosterone  Adapted from Page Carleene Overlie, et al. "Drugs That May Cause or Exacerbate Heart Failure: A Scientific Statement from the American Heart  Association." Circulation 2016; 134:e32-e69. DOI: 10.1161/CIR.0000000000000426   MEDICATION ADHERENCES TIPS AND STRATEGIES Taking medication as prescribed improves patient outcomes in heart failure (reduces hospitalizations, improves symptoms, increases survival) Side effects of medications can be managed by decreasing doses, switching agents, stopping drugs, or adding additional therapy. Please let someone in the [facility] know if you have having bothersome side effects so we can modify your regimen. Do not alter your medication regimen without talking to [place].  Medication reminders can help patients remember to take drugs on time. If you are missing or  forgetting doses you can try linking behaviors, using pill boxes, or an electronic reminder like an alarm on your phone or an app. Some people can also get automated phone calls as medication reminders.

## 2021-11-29 NOTE — Telephone Encounter (Signed)
Attempted to cancel appt .  No ans no vm

## 2021-12-05 NOTE — Progress Notes (Deleted)
Cardiology Office Note  Date:  12/05/2021   ID:  ZABDIEL DRIPPS, DOB 08/23/1960, MRN [id number]  PCP:  [facility] [place]   No chief complaint on file.   HPI:  Jimmy Olson is a 61 y.o. male with history of  nonobstructive CAD by LHC in 06/2020,  chronic combined systolic and diastolic CHF,  NICM,  CKD stage IV/esrd, on HD Covid infection in 02/2020 status post monoclonal antibody infusion,  DM2,  HTN,  HLD, " anemia, and anxiety  On Eliquis for TIA, DVT who presents for follow up of his cardiomyopathy, CAD/nsetmi, DVT lower extremity December 2022 on Eliquis  Last seen in clinic by myself April 2023   Reports he is doing well with dialysis and fluid removal Typical run time 3:30 to 3:45 three times a week Sleeping ok, more during the daytime as medications make you feel tired  Reports he is taking both bisoprolol and carvedilol No longer takes Lasix, off gabapentin  Scheduled for surgery AV fistula in 2 days time with Dr. Lucky Cowboy  Denies significant chest pain concerning for angina  EKG personally reviewed by myself on todays visit Normal sinus rhythm rate 80 bpm T wave inversions V3 through V6 1 and aVL  Past medical history reviewed Left heart catheterization June 03, 2021  status post ablation of AVNRT 05/20/2019. 1.  Heavily calcified coronary arteries with moderate two-vessel coronary artery disease involving the LAD and right coronary artery.  No culprit is identified for non-STEMI. 2.  Mildly reduced LV systolic function and mildly elevated left ventricular end-diastolic pressure.  disease appears to be chronic with no acute culprit identified  Echocardiogram  Left ventricular ejection fraction, by estimation, is 45 to 50%. The  left ventricle has mildly decreased function. The left ventricle  demonstrates global hypokinesis. There is mild left ventricular  hypertrophy. Left ventricular diastolic parameters  are consistent with Grade I  diastolic dysfunction (impaired relaxation).   2. Right ventricular systolic function is normal. The right ventricular  size is normal. Tricuspid regurgitation signal is inadequate for assessing  PA pressure.   discharge from the hospital July 15, 2021 Admission to the hospital for Severe headache along with left arm numbness and dizziness inability to stand without assistance: Work-up showed no acute stroke, likely TIA.  Continue Eliquis was recommendation   hospital in 08/2019 with exertional chest pain with high-sensitivity troponin peaking at 199.  Echo showed an EF of 45 to 50%, global hypokinesis, moderate LVH, and grade 1 diastolic dysfunction.    admitted in 06/2020 with acute on chronic combined systolic and diastolic CHF complicated by acute on CKD stage IV.   Echo on 07/16/2020 showed an EF of 30 to 35%, global hypokinesis, mildly dilated LV cavity size, mild LVH, grade 2 diastolic dysfunction, normal RV systolic function and ventricular cavity size, mildly dilated left atrium, and mild mitral valve regurgitation.    R/LHC on 07/19/2020 showed moderately calcified coronary arteries with moderate two-vessel CAD involving the LAD and RCA  RHC showed mildly elevated filling pressures, mild pulmonary hypertension, and normal cardiac output.    acute on CKD stage IV with consideration for hemodialysis  evaluated by psychiatry with anxiety, depression, and suicidal ideation.     PMH:   has a past medical history of Anemia, Anxiety and depression, ATN (acute tubular necrosis) (HCC), CAD (coronary artery disease), Chronic HFrEF (heart failure with reduced ejection fraction) ([place]), DVT of axillary vein, acute left ([place]) (05/2021), ESRD (end stage renal disease)  on dialysis [facility]), History of 2019 novel coronavirus disease (COVID-19) (03/06/2020), HLD (hyperlipidemia), Hyperkalemia (12/23/2020), Hypertension, Left carotid artery stenosis (08/18/2021), Long term current use of anticoagulant, NICM  (nonischemic cardiomyopathy) ([place]), NSTEMI (non-ST elevated myocardial infarction) ([place]) (07/15/2020), Sepsis ([place]), Suicidal ideations, TIA (transient ischemic attack) (06/2021), and Type 2 diabetes mellitus treated with insulin ([place]).  PSH:    Past Surgical History:  Procedure Laterality Date   APPENDECTOMY     AV FISTULA PLACEMENT Left 09/29/2021   Procedure: ARTERIOVENOUS (AV) FISTULA CREATION ( RADIAL CEPHALIC);  Surgeon: Algernon Huxley, MD;  Location: [facility] ORS;  Service: Vascular;  Laterality: Left;   CARDIAC CATHETERIZATION     DIALYSIS/PERMA CATHETER INSERTION N/A 06/02/2021   Procedure: DIALYSIS/PERMA CATHETER INSERTION;  Surgeon: Algernon Huxley, MD;  Location: [place] [facility];  Service: Cardiovascular;  Laterality: N/A;   LEFT HEART CATH AND CORONARY ANGIOGRAPHY N/A 06/03/2021   Procedure: LEFT HEART CATH AND CORONARY ANGIOGRAPHY;  Surgeon: Wellington Hampshire, MD;  Location: [place] [facility];  Service: Cardiovascular;  Laterality: N/A;   LIGATION OF ARTERIOVENOUS  FISTULA Left 10/02/2021   Procedure: LIGATION OF ARTERIOVENOUS  FISTULA;  Surgeon: Evaristo Bury, MD;  Location: [facility];  Service: Vascular;  Laterality: Left;   NASAL SINUS SURGERY     RIGHT/LEFT HEART CATH AND CORONARY ANGIOGRAPHY N/A 07/19/2020   Procedure: RIGHT/LEFT HEART CATH AND CORONARY ANGIOGRAPHY;  Surgeon: Wellington Hampshire, MD;  Location: [place] [facility];  Service: Cardiovascular;  Laterality: N/A;    Current Outpatient Medications  Medication Sig Dispense Refill   acetaminophen (TYLENOL) 325 MG tablet Take 650 mg by mouth every 6 (six) hours as needed.     albuterol (PROVENTIL HFA) 108 (90 Base) MCG/ACT inhaler INHALE 2 PUFFS INTO THE LUNGS ONCE EVERY 6 HOURS AS NEEDED FOR WHEEZING OR SHORTNESS OF BREATH. (Patient taking differently: Inhale into the lungs. Needs ~2-3 times per day) 6.7 g 10   apixaban (ELIQUIS) 5 MG TABS tablet TAKE 1 TABLET BY MOUTH TWICE DAILY. (Patient taking differently: Take 5  mg by mouth daily.) 180 tablet 1   carvedilol (COREG) 25 MG tablet Take 1 tablet (25 mg total) by mouth 2 (two) times daily. 180 tablet 3   escitalopram (LEXAPRO) 5 MG tablet Take 1 tablet (5 mg total) by mouth at bedtime. 30 tablet 2   ezetimibe (ZETIA) 10 MG tablet Take 10 mg by mouth daily.     HUMALOG KWIKPEN 100 UNIT/ML KwikPen Inject 3 Units into the skin 3 (three) times daily. Sliding scale     hydrALAZINE (APRESOLINE) 100 MG tablet Take 1 tablet (100 mg total) by mouth once every 8 (eight) hours. (Patient taking differently: Take 100 mg by mouth 2 (two) times daily.) 90 tablet 6   HYDROcodone-acetaminophen (NORCO/VICODIN) 5-325 MG tablet Take 2 tablets by mouth every 6 (six) hours as needed for moderate pain. (Patient not taking: Reported on 11/29/2021) 20 tablet 0   insulin detemir (LEVEMIR FLEXPEN) 100 UNIT/ML FlexPen Inject 20 Units into the skin once daily at bedtime. (Patient taking differently: Inject 25 Units into the skin at bedtime.) 15 mL 5   Insulin Pen Needle 32G X 4 MM MISC USE AS DIRECTED 100 each 0   isosorbide mononitrate (IMDUR) 30 MG 24 hr tablet Take 1 tablet (30 mg total) by mouth in the morning and at bedtime. 90 tablet 3   losartan (COZAAR) 50 MG tablet Take 1 tablet (50 mg total) by mouth at bedtime. 30 tablet 2  multivitamin (RENA-VIT) TABS tablet Take 1 tablet by mouth at bedtime. (Patient not taking: Reported on 11/29/2021) 30 tablet 2   Naphazoline-Pheniramine (OPCON-A) 0.027-0.315 % SOLN Apply 2 drops to eye at bedtime.     nitroGLYCERIN (NITROSTAT) 0.4 MG SL tablet Place 1 tablet (0.4 mg total) under the tongue every 5 (five) minutes as needed for chest pain. 30 tablet 0   Nutritional Supplements (FEEDING SUPPLEMENT, NEPRO CARB STEADY,) LIQD Take 237 mLs by mouth 3 (three) times daily between meals.  0   rosuvastatin (CRESTOR) 10 MG tablet Take 1 tablet (10 mg total) by mouth daily. 30 tablet 1   torsemide (DEMADEX) 20 MG tablet Take 20 mg by mouth daily as needed.  Takes on Sat and Sun     umeclidinium bromide (INCRUSE ELLIPTA) 62.5 MCG/ACT AEPB Inhale 1 puff into the lungs 2 (two) times daily.     No current facility-administered medications for this visit.    Allergies:   Patient has no known allergies.   Social History:  The patient  reports that he has never smoked. He has never used smokeless tobacco. He reports that he does not currently use alcohol. He reports that he does not use drugs.   Family History:   family history includes Diabetes in his brother; Heart failure in his mother; Kidney failure in his mother; Other in his father.    Review of Systems: Review of Systems  Constitutional: Negative.   HENT: Negative.    Respiratory: Negative.    Cardiovascular: Negative.   Gastrointestinal: Negative.   Musculoskeletal: Negative.   Neurological: Negative.   Psychiatric/Behavioral: Negative.    All other systems reviewed and are negative.    PHYSICAL EXAM: VS:  There were no vitals taken for this visit. , BMI There is no height or weight on file to calculate BMI. Constitutional:  oriented to person, place, and time. No distress.  HENT:  Head: Grossly normal Eyes:  no discharge. No scleral icterus.  Neck: No JVD, no carotid bruits  Cardiovascular: Regular rate and rhythm, no murmurs appreciated Pulmonary/Chest: Clear to auscultation bilaterally, no wheezes or rails Abdominal: Soft.  no distension.  no tenderness.  Musculoskeletal: Normal range of motion Neurological:  normal muscle tone. Coordination normal. No atrophy Skin: Skin warm and dry Psychiatric: normal affect, pleasant  Recent Labs: 08/08/2021: B Natriuretic Peptide 2,160.8 08/12/2021: Magnesium 2.2 10/06/2021: Hemoglobin 10.3; Platelets 189 10/08/2021: ALT 16; BUN 23; Creatinine, Ser 3.72; Potassium 4.0; Sodium 135    Lipid Panel Lab Results  Component Value Date   CHOL 113 10/02/2021   HDL 34 (L) 10/02/2021   LDLCALC 45 10/02/2021   TRIG 170 (H) 10/02/2021       Wt Readings from Last 3 Encounters:  11/29/21 150 lb (68 kg)  11/15/21 151 lb (68.5 kg)  10/07/21 142 lb 3.2 oz (64.5 kg)     ASSESSMENT AND PLAN:  Problem List Items Addressed This Visit   None End-stage renal disease on hemodialysis Dialysis Monday Wednesday and Friday Reports 3 0.5 up to 3 hours 45 minutes seems to work well 3 days a week She is euvolemic, no PND orthopnea Blood pressure stable, no orthostasis symptoms Reports he is not on Lasix  Coronary artery disease with stable angina Currently with no symptoms of angina. No further workup at this time. Continue current medication regimen. Continue aggressive lipid management and diabetes control  Poorly controlled diabetes type 2 with complications Management per [name] Stressed importance of aggressive diabetes control  Total encounter time more than 30 minutes  Greater than 50% was spent in counseling and coordination of care with the patient    Signed, Esmond Plants, M.D., Ph.D. [place], [place]

## 2021-12-06 ENCOUNTER — Inpatient Hospital Stay
Admission: RE | Admit: 2021-12-06 | Discharge: 2021-12-06 | Disposition: A | Discharge status: Home / Self Care | Payer: Medicare Other | Source: Ambulatory Visit

## 2021-12-06 ENCOUNTER — Ambulatory Visit: Payer: Self-pay | Admitting: Cardiovascular Disease

## 2021-12-06 NOTE — Pre-Procedure Instructions (Addendum)
PAT call attempted with patient using an interpreter ID # [phone],  per patient, he expresses that he does not wish to proceed with his procedure scheduled with Dr. Lucky Cowboy at this time because his left hand is numb and he can only use his right hand at this time. He is scheduled to have a fistula creation done on the right arm on 12/15/21. This Probation officer advised him to contact Dr. Ozella Almond office asap.

## 2021-12-15 ENCOUNTER — Ambulatory Visit: Admission: RE | Admit: 2021-12-15 | Payer: Medicare Other | Source: Ambulatory Visit | Admitting: Vascular Surgery

## 2021-12-15 ENCOUNTER — Encounter: Admission: RE | Payer: Self-pay | Source: Ambulatory Visit

## 2021-12-15 SURGERY — ARTERIOVENOUS (AV) FISTULA CREATION
Anesthesia: General | Laterality: Right

## 2021-12-22 ENCOUNTER — Encounter: Payer: Self-pay | Admitting: Cardiovascular Disease

## 2021-12-26 ENCOUNTER — Encounter: Payer: Self-pay | Admitting: Emergency Medicine

## 2021-12-26 ENCOUNTER — Emergency Department: Payer: Medicare Other

## 2021-12-26 ENCOUNTER — Emergency Department
Admission: EM | Admit: 2021-12-26 | Discharge: 2021-12-26 | Disposition: A | Discharge status: Home / Self Care | Payer: Medicare Other | Attending: Emergency Medicine | Admitting: Emergency Medicine

## 2021-12-26 ENCOUNTER — Other Ambulatory Visit: Payer: Self-pay

## 2021-12-26 DIAGNOSIS — N186 End stage renal disease: Secondary | ICD-10-CM | POA: Insufficient documentation

## 2021-12-26 DIAGNOSIS — Z992 Dependence on renal dialysis: Secondary | ICD-10-CM | POA: Diagnosis not present

## 2021-12-26 DIAGNOSIS — H538 Other visual disturbances: Secondary | ICD-10-CM | POA: Diagnosis not present

## 2021-12-26 DIAGNOSIS — I132 Hypertensive heart and chronic kidney disease with heart failure and with stage 5 chronic kidney disease, or end stage renal disease: Secondary | ICD-10-CM | POA: Insufficient documentation

## 2021-12-26 DIAGNOSIS — S0990XA Unspecified injury of head, initial encounter: Secondary | ICD-10-CM | POA: Diagnosis not present

## 2021-12-26 DIAGNOSIS — W503XXA Accidental bite by another person, initial encounter: Secondary | ICD-10-CM | POA: Diagnosis not present

## 2021-12-26 DIAGNOSIS — I5032 Chronic diastolic (congestive) heart failure: Secondary | ICD-10-CM | POA: Insufficient documentation

## 2021-12-26 DIAGNOSIS — I2510 Atherosclerotic heart disease of native coronary artery without angina pectoris: Secondary | ICD-10-CM | POA: Diagnosis not present

## 2021-12-26 DIAGNOSIS — Z7901 Long term (current) use of anticoagulants: Secondary | ICD-10-CM

## 2021-12-26 DIAGNOSIS — I10 Essential (primary) hypertension: Secondary | ICD-10-CM

## 2021-12-26 DIAGNOSIS — E1122 Type 2 diabetes mellitus with diabetic chronic kidney disease: Secondary | ICD-10-CM | POA: Diagnosis not present

## 2021-12-26 LAB — CBC WITH DIFFERENTIAL/PLATELET
Abs Immature Granulocytes: 0.03 10*3/uL (ref 0.00–0.07)
Basophils Absolute: 0 10*3/uL (ref 0.0–0.1)
Basophils Relative: 0 %
Eosinophils Absolute: 0.2 10*3/uL (ref 0.0–0.5)
Eosinophils Relative: 3 %
HCT: 38.1 % — ABNORMAL LOW (ref 39.0–52.0)
Hemoglobin: 13.2 g/dL (ref 13.0–17.0)
Immature Granulocytes: 0 %
Lymphocytes Relative: 13 %
Lymphs Abs: 1 10*3/uL (ref 0.7–4.0)
MCH: 29.5 pg (ref 26.0–34.0)
MCHC: 34.6 g/dL (ref 30.0–36.0)
MCV: 85 fL (ref 80.0–100.0)
Monocytes Absolute: 0.5 10*3/uL (ref 0.1–1.0)
Monocytes Relative: 6 %
Neutro Abs: 6 10*3/uL (ref 1.7–7.7)
Neutrophils Relative %: 78 %
Platelets: 237 10*3/uL (ref 150–400)
RBC: 4.48 MIL/uL (ref 4.22–5.81)
RDW: 13.5 % (ref 11.5–15.5)
WBC: 7.7 10*3/uL (ref 4.0–10.5)
nRBC: 0 % (ref 0.0–0.2)

## 2021-12-26 LAB — BASIC METABOLIC PANEL
Anion gap: 16 — ABNORMAL HIGH (ref 5–15)
BUN: 73 mg/dL — ABNORMAL HIGH (ref 6–20)
CO2: 18 mmol/L — ABNORMAL LOW (ref 22–32)
Calcium: 9.2 mg/dL (ref 8.9–10.3)
Chloride: 103 mmol/L (ref 98–111)
Creatinine, Ser: 5.14 mg/dL — ABNORMAL HIGH (ref 0.61–1.24)
GFR, Estimated: 12 mL/min — ABNORMAL LOW (ref >60)
Glucose, Bld: 271 mg/dL — ABNORMAL HIGH (ref 70–99)
Potassium: 5.3 mmol/L — ABNORMAL HIGH (ref 3.5–5.1)
Sodium: 137 mmol/L (ref 135–145)

## 2021-12-26 LAB — PROTIME-INR
INR: 1 (ref 0.8–1.2)
Prothrombin Time: 12.7 seconds (ref 11.4–15.2)

## 2021-12-26 MED ORDER — TETRACAINE HCL 0.5 % OP SOLN
2.0000 [drp] | Freq: Once | OPHTHALMIC | Status: AC
  Administered 2021-12-26: 2 [drp] via OPHTHALMIC
  Filled 2021-12-26: qty 4

## 2021-12-26 MED ORDER — FLUORESCEIN SODIUM 1 MG OP STRP
1.0000 | ORAL_STRIP | Freq: Once | OPHTHALMIC | Status: AC
  Administered 2021-12-26: 1 via OPHTHALMIC
  Filled 2021-12-26: qty 1

## 2021-12-26 MED ORDER — FENTANYL CITRATE PF 50 MCG/ML IJ SOSY
50.0000 ug | PREFILLED_SYRINGE | Freq: Once | INTRAMUSCULAR | Status: AC
  Administered 2021-12-26: 50 ug via INTRAVENOUS
  Filled 2021-12-26: qty 1
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
## 2022-01-09 ENCOUNTER — Other Ambulatory Visit: Payer: Self-pay

## 2022-01-10 ENCOUNTER — Other Ambulatory Visit: Payer: Self-pay

## 2022-01-17 NOTE — Telephone Encounter (Signed)
Schedule 8/11 hammock

## 2022-01-18 ENCOUNTER — Other Ambulatory Visit: Payer: Self-pay

## 2022-01-19 ENCOUNTER — Other Ambulatory Visit: Payer: Self-pay

## 2022-01-20 NOTE — Telephone Encounter (Signed)
Attempted to cancel.  No ans no vm.  Will fu

## 2022-01-22 ENCOUNTER — Emergency Department: Payer: Medicare Other

## 2022-01-22 ENCOUNTER — Other Ambulatory Visit: Payer: Self-pay

## 2022-01-22 ENCOUNTER — Emergency Department
Admission: EM | Admit: 2022-01-22 | Discharge: 2022-01-22 | Disposition: A | Discharge status: Home / Self Care | Payer: Medicare Other | Attending: Emergency Medicine | Admitting: Emergency Medicine

## 2022-01-22 DIAGNOSIS — R109 Unspecified abdominal pain: Secondary | ICD-10-CM | POA: Insufficient documentation

## 2022-01-22 DIAGNOSIS — Z794 Long term (current) use of insulin: Secondary | ICD-10-CM | POA: Diagnosis not present

## 2022-01-22 DIAGNOSIS — N186 End stage renal disease: Secondary | ICD-10-CM | POA: Diagnosis not present

## 2022-01-22 DIAGNOSIS — Z7901 Long term (current) use of anticoagulants: Secondary | ICD-10-CM | POA: Diagnosis not present

## 2022-01-22 DIAGNOSIS — E1122 Type 2 diabetes mellitus with diabetic chronic kidney disease: Secondary | ICD-10-CM | POA: Insufficient documentation

## 2022-01-22 DIAGNOSIS — I132 Hypertensive heart and chronic kidney disease with heart failure and with stage 5 chronic kidney disease, or end stage renal disease: Secondary | ICD-10-CM | POA: Insufficient documentation

## 2022-01-22 DIAGNOSIS — Z79899 Other long term (current) drug therapy: Secondary | ICD-10-CM | POA: Diagnosis not present

## 2022-01-22 DIAGNOSIS — R519 Headache, unspecified: Secondary | ICD-10-CM

## 2022-01-22 DIAGNOSIS — E1165 Type 2 diabetes mellitus with hyperglycemia: Secondary | ICD-10-CM | POA: Diagnosis not present

## 2022-01-22 DIAGNOSIS — R778 Other specified abnormalities of plasma proteins: Secondary | ICD-10-CM | POA: Diagnosis not present

## 2022-01-22 DIAGNOSIS — Z992 Dependence on renal dialysis: Secondary | ICD-10-CM | POA: Diagnosis not present

## 2022-01-22 DIAGNOSIS — R739 Hyperglycemia, unspecified: Secondary | ICD-10-CM

## 2022-01-22 DIAGNOSIS — I5022 Chronic systolic (congestive) heart failure: Secondary | ICD-10-CM | POA: Diagnosis not present

## 2022-01-22 DIAGNOSIS — I2510 Atherosclerotic heart disease of native coronary artery without angina pectoris: Secondary | ICD-10-CM | POA: Insufficient documentation

## 2022-01-22 DIAGNOSIS — I10 Essential (primary) hypertension: Secondary | ICD-10-CM

## 2022-01-22 DIAGNOSIS — R079 Chest pain, unspecified: Secondary | ICD-10-CM

## 2022-01-22 LAB — TROPONIN I (HIGH SENSITIVITY)
Troponin I (High Sensitivity): 84 ng/L — ABNORMAL HIGH (ref <18)
Troponin I (High Sensitivity): 85 ng/L — ABNORMAL HIGH (ref <18)

## 2022-01-22 LAB — CBC
HCT: 34.4 % — ABNORMAL LOW (ref 39.0–52.0)
Hemoglobin: 11.5 g/dL — ABNORMAL LOW (ref 13.0–17.0)
MCH: 29.3 pg (ref 26.0–34.0)
MCHC: 33.4 g/dL (ref 30.0–36.0)
MCV: 87.8 fL (ref 80.0–100.0)
Platelets: 202 10*3/uL (ref 150–400)
RBC: 3.92 MIL/uL — ABNORMAL LOW (ref 4.22–5.81)
RDW: 12.9 % (ref 11.5–15.5)
WBC: 6.2 10*3/uL (ref 4.0–10.5)
nRBC: 0.3 % — ABNORMAL HIGH (ref 0.0–0.2)

## 2022-01-22 LAB — CBG MONITORING, ED
Glucose-Capillary: 572 mg/dL (ref 70–99)
Glucose-Capillary: 242 mg/dL — ABNORMAL HIGH (ref 70–99)
Glucose-Capillary: 239 mg/dL — ABNORMAL HIGH (ref 70–99)

## 2022-01-22 LAB — COMPREHENSIVE METABOLIC PANEL
ALT: 21 U/L (ref 0–44)
AST: 20 U/L (ref 15–41)
Albumin: 3.4 g/dL — ABNORMAL LOW (ref 3.5–5.0)
Alkaline Phosphatase: 152 U/L — ABNORMAL HIGH (ref 38–126)
Anion gap: 10 (ref 5–15)
BUN: 43 mg/dL — ABNORMAL HIGH (ref 6–20)
CO2: 22 mmol/L (ref 22–32)
Calcium: 9.4 mg/dL (ref 8.9–10.3)
Chloride: 100 mmol/L (ref 98–111)
Creatinine, Ser: 4.02 mg/dL — ABNORMAL HIGH (ref 0.61–1.24)
GFR, Estimated: 16 mL/min — ABNORMAL LOW (ref >60)
Glucose, Bld: 621 mg/dL (ref 70–99)
Potassium: 5 mmol/L (ref 3.5–5.1)
Sodium: 132 mmol/L — ABNORMAL LOW (ref 135–145)
Total Bilirubin: 0.8 mg/dL (ref 0.3–1.2)
Total Protein: 6.7 g/dL (ref 6.5–8.1)

## 2022-01-22 LAB — BETA-HYDROXYBUTYRIC ACID: Beta-Hydroxybutyric Acid: 0.1 mmol/L (ref 0.05–0.27)

## 2022-01-22 LAB — LIPASE, BLOOD: Lipase: 64 U/L — ABNORMAL HIGH (ref 11–51)

## 2022-01-22 MED ORDER — INSULIN ASPART 100 UNIT/ML IJ SOLN
10.0000 [IU] | Freq: Once | INTRAMUSCULAR | Status: AC
  Administered 2022-01-22: 10 [IU] via INTRAVENOUS
  Filled 2022-01-22: qty 1

## 2022-01-22 MED ORDER — LOSARTAN POTASSIUM 50 MG PO TABS
50.0000 mg | ORAL_TABLET | Freq: Once | ORAL | Status: AC
  Administered 2022-01-22: 50 mg via ORAL
  Filled 2022-01-22: qty 1

## 2022-01-22 MED ORDER — ALUM & MAG HYDROXIDE-SIMETH 200-200-20 MG/5ML PO SUSP
30.0000 mL | Freq: Once | ORAL | Status: AC
  Administered 2022-01-22: 30 mL via ORAL
  Filled 2022-01-22: qty 30

## 2022-01-22 MED ORDER — HYDRALAZINE HCL 50 MG PO TABS
100.0000 mg | ORAL_TABLET | Freq: Once | ORAL | Status: AC
Start: 2022-01-22 — End: 2022-01-22
  Administered 2022-01-22: 100 mg via ORAL
  Filled 2022-01-22: qty 2

## 2022-01-22 MED ORDER — ACETAMINOPHEN 500 MG PO TABS
1000.0000 mg | ORAL_TABLET | Freq: Once | ORAL | Status: AC
Start: 2022-01-22 — End: 2022-01-22
  Administered 2022-01-22: 1000 mg via ORAL
  Filled 2022-01-22: qty 2

## 2022-01-22 MED ORDER — CARVEDILOL 25 MG PO TABS
25.0000 mg | ORAL_TABLET | Freq: Once | ORAL | Status: AC
Start: 2022-01-22 — End: 2022-01-22
  Administered 2022-01-22: 25 mg via ORAL
  Filled 2022-01-22: qty 1

## 2022-01-22 MED ORDER — HYDRALAZINE HCL 20 MG/ML IJ SOLN
10.0000 mg | Freq: Once | INTRAMUSCULAR | Status: AC
  Administered 2022-01-22: 10 mg via INTRAVENOUS
  Filled 2022-01-22: qty 1

## 2022-01-22 MED ORDER — LIDOCAINE 5 % EX PTCH
1.0000 | MEDICATED_PATCH | CUTANEOUS | Status: DC
  Administered 2022-01-22: 1 via TRANSDERMAL
  Filled 2022-01-22: qty 1

## 2022-01-22 MED ORDER — DIPHENHYDRAMINE HCL 50 MG/ML IJ SOLN
12.5000 mg | Freq: Once | INTRAMUSCULAR | Status: AC
Start: 2022-01-22 — End: 2022-01-22
  Administered 2022-01-22: 12.5 mg via INTRAVENOUS
  Filled 2022-01-22: qty 1

## 2022-01-22 MED ORDER — PROCHLORPERAZINE EDISYLATE 10 MG/2ML IJ SOLN
5.0000 mg | Freq: Once | INTRAMUSCULAR | Status: AC
Start: 2022-01-22 — End: 2022-01-22
  Administered 2022-01-22: 5 mg via INTRAVENOUS
  Filled 2022-01-22: qty 2

## 2022-01-22 MED ORDER — PANTOPRAZOLE SODIUM 20 MG PO TBEC
20.0000 mg | DELAYED_RELEASE_TABLET | Freq: Every day | ORAL | 0 refills | Status: DC
  Filled 2022-01-22: qty 30, 30d supply, fill #0

## 2022-01-22 NOTE — Discharge Instructions (Signed)
Your work-up today was reassuring without evidence of heart attack but you are high risk for heart attacks if you develop recurrent symptoms you should return to the ER.  I am starting you on acid reducer and given you a GI number to make for follow-up.  Return to the ER if develop worsening symptoms or any other concerns

## 2022-01-22 NOTE — ED Provider Notes (Signed)
[facility] Provider Note    Event Date/Time   First MD Initiated Contact with Patient 01/22/22 [id number]     (approximate)   History   Chest Pain   HPI  Jimmy Olson is a 61 y.o. male with coronary artery disease, CHF, hypertension, DVT on apixaban, insulin-dependent type 2 diabetes, end-stage renal disease on hemodialysis Tuesday, Thursday and Saturday who presents to the emergency department with complaints of severe headache.  States started having headache around midnight after getting into an argument with his son.  He noticed that his blood pressure went up afterwards.  He has been compliant with his blood pressure medications.  It looks like he is on hydralazine, Coreg, Cozaar, furosemide.  He states he has had a little bit of chest pain and shortness of breath.  No vision changes, numbness or weakness.  No head injury.  Denies any missed dialysis sessions.   History provided by patient using Spanish interpreter.    Past Medical History:  Diagnosis Date   Anemia    Anxiety and depression    ATN (acute tubular necrosis) (HCC)    CAD (coronary artery disease)    a.) R/LHC 07/19/2020: 20% pRCA, 30% mRCA, 50% dRCA, 50% RPAV, 40% pLAD, 70% D1, 60% mLAD; LVEDP 19 mmHg; PA 37/16 (26 mmHg); PCWP 13 mmHg. b.) LHC 06/03/2021: EF 45-50%; LVEDP 18 mmHg; 40% pLAD, 60% mLAD, 20% pRCA, 30% mRCA, 70% D1, 30% dRCA, 40% RPDA.   Chronic HFrEF (heart failure with reduced ejection fraction) ([place])    a.) 06/2020 Echo: EF 30-35%, glob HK, GrII DD, nl RV fxn. Mild LAE. b.)  TTE 08/08/2021: EF 40-45%; global HK; mild LVH; GLS -8.6; mild LA dilation.   DVT of axillary vein, acute left ([place]) 05/2021   a.) Tx'd with apixaban   ESRD (end stage renal disease) on dialysis [facility])    a.) M-W-F   History of 2019 novel coronavirus disease (COVID-19) 03/06/2020   a.) s/p Tx with monoclonal Ab infusion   HLD (hyperlipidemia)    Hyperkalemia 12/23/2020   Hypertension    Left carotid  artery stenosis 08/18/2021   a.) Carotid Doppler 23/53/6144: 31-54% LICA stenosis   Long term current use of anticoagulant    a.) apixaban for DVT   NICM (nonischemic cardiomyopathy) ([place])    a.) 07/2009 MV: EF 48%; b.) 08/2019 Echo: EF 45-50%. Global HK. Mod LVH. GrIDD; c.) 06/2020 Echo: EF 30-35%, glob HK, mild LVH, GrII DD, nl RV fxn, mild LAE; d.) 06/2020 Cath: nonobs dzs. e.)  LHC 06/03/2021; EF 45-50%; LVEDP 18 mmHg; nonobstructive CAD. f.) b.)  TTE 08/08/2021: EF 40-45%; global HK; mild LVH; GLS -8.6; mild LA dilation.   NSTEMI (non-ST elevated myocardial infarction) ([place]) 07/15/2020   a.) hsTnI trended: 124--> 127--> 176 --> 190 --> 200 ng/L.   Sepsis ([place])    Suicidal ideations    TIA (transient ischemic attack) 06/2021   Type 2 diabetes mellitus treated with insulin [facility])     Past Surgical History:  Procedure Laterality Date   APPENDECTOMY     AV FISTULA PLACEMENT Left 09/29/2021   Procedure: ARTERIOVENOUS (AV) FISTULA CREATION ( RADIAL CEPHALIC);  Surgeon: Algernon Huxley, MD;  Location: [facility] ORS;  Service: Vascular;  Laterality: Left;   CARDIAC CATHETERIZATION     DIALYSIS/PERMA CATHETER INSERTION N/A 06/02/2021   Procedure: DIALYSIS/PERMA CATHETER INSERTION;  Surgeon: Algernon Huxley, MD;  Location: [place] CV LAB;  Service: Cardiovascular;  Laterality: N/A;   LEFT  HEART CATH AND CORONARY ANGIOGRAPHY N/A 06/03/2021   Procedure: LEFT HEART CATH AND CORONARY ANGIOGRAPHY;  Surgeon: Wellington Hampshire, MD;  Location: [place] CV LAB;  Service: Cardiovascular;  Laterality: N/A;   LIGATION OF ARTERIOVENOUS  FISTULA Left 10/02/2021   Procedure: LIGATION OF ARTERIOVENOUS  FISTULA;  Surgeon: Evaristo Bury, MD;  Location: [facility] ORS;  Service: Vascular;  Laterality: Left;   NASAL SINUS SURGERY     RIGHT/LEFT HEART CATH AND CORONARY ANGIOGRAPHY N/A 07/19/2020   Procedure: RIGHT/LEFT HEART CATH AND CORONARY ANGIOGRAPHY;  Surgeon: Wellington Hampshire, MD;  Location: [place] CV LAB;   Service: Cardiovascular;  Laterality: N/A;    MEDICATIONS:  Prior to Admission medications   Medication Sig Start Date End Date Taking? Authorizing Provider  acetaminophen (TYLENOL) 325 MG tablet Take 650 mg by mouth every 6 (six) hours as needed.    [provider]  albuterol (PROVENTIL HFA) 108 (90 Base) MCG/ACT inhaler INHALE 2 PUFFS INTO THE LUNGS ONCE EVERY 6 HOURS AS NEEDED FOR WHEEZING OR SHORTNESS OF BREATH. Patient taking differently: Inhale into the lungs. Needs ~2-3 times per day 07/13/21     apixaban (ELIQUIS) 5 MG TABS tablet TAKE 1 TABLET BY MOUTH TWICE DAILY. Patient taking differently: Take 5 mg by mouth daily. 07/11/21     carvedilol (COREG) 25 MG tablet Take 1 tablet (25 mg total) by mouth 2 (two) times daily. 11/29/21   Alisa Graff, FNP  escitalopram (LEXAPRO) 5 MG tablet Take 1 tablet (5 mg total) by mouth at bedtime. 10/08/21   Ezekiel Slocumb, DO  ezetimibe (ZETIA) 10 MG tablet Take 10 mg by mouth daily. 08/26/21   [provider]  HUMALOG KWIKPEN 100 UNIT/ML KwikPen Inject 3 Units into the skin 3 (three) times daily. Sliding scale 07/11/21   [provider]  hydrALAZINE (APRESOLINE) 100 MG tablet Take 1 tablet (100 mg total) by mouth once every 8 (eight) hours. Patient taking differently: Take 100 mg by mouth 2 (two) times daily. 07/25/21   Minna Merritts, MD  HYDROcodone-acetaminophen (NORCO/VICODIN) 5-325 MG tablet Take 2 tablets by mouth every 6 (six) hours as needed for moderate pain. Patient not taking: Reported on 11/29/2021 09/29/21 09/29/22  Algernon Huxley, MD  insulin detemir (LEVEMIR FLEXPEN) 100 UNIT/ML FlexPen Inject 20 Units into the skin once daily at bedtime. Patient taking differently: Inject 25 Units into the skin at bedtime. 10/10/21 03/24/22    Insulin Pen Needle 32G X 4 MM MISC USE AS DIRECTED 06/07/21   Ralene Muskrat B, MD  isosorbide mononitrate (IMDUR) 30 MG 24 hr tablet Take 1 tablet (30 mg total) by mouth in the morning  and at bedtime. 11/29/21   Alisa Graff, FNP  losartan (COZAAR) 50 MG tablet Take 1 tablet (50 mg total) by mouth at bedtime. 10/08/21   Ezekiel Slocumb, DO  multivitamin (RENA-VIT) TABS tablet Take 1 tablet by mouth at bedtime. Patient not taking: Reported on 11/29/2021 10/08/21   Ezekiel Slocumb, DO  Naphazoline-Pheniramine (OPCON-A) 0.027-0.315 % SOLN Apply 2 drops to eye at bedtime.    [provider]  nitroGLYCERIN (NITROSTAT) 0.4 MG SL tablet Place 1 tablet (0.4 mg total) under the tongue every 5 (five) minutes as needed for chest pain. 07/25/20   Jennye Boroughs, MD  Nutritional Supplements (FEEDING SUPPLEMENT, NEPRO CARB STEADY,) LIQD Take 237 mLs by mouth 3 (three) times daily between meals. 10/08/21   Nicole Kindred A, DO  rosuvastatin (CRESTOR) 10 MG tablet  Take 1 tablet (10 mg total) by mouth daily. 10/09/21   Ezekiel Slocumb, DO  torsemide (DEMADEX) 20 MG tablet Take 20 mg by mouth daily as needed. Takes on Sat and Sun    [provider]  umeclidinium bromide (INCRUSE ELLIPTA) 62.5 MCG/ACT AEPB Inhale 1 puff into the lungs 2 (two) times daily.    [provider]  gabapentin (NEURONTIN) 300 MG capsule Take 1 capsule (300 mg total) by mouth at bedtime for 7 days. 06/07/21 08/18/21  Sidney Ace, MD  isosorbide dinitrate (ISORDIL) 30 MG tablet TAKE 1 TABLET BY MOUTH ONCE DAILY. 07/12/21 07/13/21      Physical Exam   Triage Vital Signs: ED Triage Vitals  Enc Vitals Group     BP 01/22/22 0435 (!) 193/98     Pulse Rate 01/22/22 0435 88     Resp 01/22/22 0435 (!) 22     Temp 01/22/22 0435 98.3 F (36.8 C)     Temp Source 01/22/22 0435 Oral     SpO2 01/22/22 0435 99 %     Weight 01/22/22 0436 155 lb (70.3 kg)     Height --      Head Circumference --      Peak Flow --      Pain Score 01/22/22 0436 8     Pain Loc --      Pain Edu? --      Excl. in [place]? --     Most recent vital signs: Vitals:   01/22/22 0655 01/22/22 0700  BP: (!) 179/87 (!)  167/83  Pulse: 87 90  Resp: 17 16  Temp:    SpO2: 97% 95%    CONSTITUTIONAL: Alert and oriented and responds appropriately to questions.  Appears uncomfortable but nontoxic HEAD: Normocephalic, atraumatic EYES: Conjunctivae clear, pupils appear equal, sclera nonicteric ENT: normal nose; moist mucous membranes NECK: Supple, normal ROM CARD: RRR; S1 and S2 appreciated; no murmurs, no clicks, no rubs, no gallops, dialysis catheter noted in the right chest wall RESP: Normal chest excursion without splinting or tachypnea; breath sounds clear and equal bilaterally; no wheezes, no rhonchi, no rales, no hypoxia or respiratory distress, speaking full sentences ABD/GI: Normal bowel sounds; non-distended; soft, non-tender, no rebound, no guarding, no peritoneal signs BACK: The back appears normal EXT: Normal ROM in all joints; no deformity noted, no edema; no cyanosis SKIN: Normal color for age and race; warm; no rash on exposed skin NEURO: Moves all extremities equally, normal speech, cranial nerves II through XII intact, normal sensation diffusely PSYCH: The patient's mood and manner are appropriate.   ED Results / Procedures / Treatments   LABS: (all labs ordered are listed, but only abnormal results are displayed) Labs Reviewed  CBC - Abnormal; Notable for the following components:      Result Value   RBC 3.92 (*)    Hemoglobin 11.5 (*)    HCT 34.4 (*)    nRBC 0.3 (*)    All other components within normal limits  COMPREHENSIVE METABOLIC PANEL - Abnormal; Notable for the following components:   Sodium 132 (*)    Glucose, Bld 621 (*)    BUN 43 (*)    Creatinine, Ser 4.02 (*)    Albumin 3.4 (*)    Alkaline Phosphatase 152 (*)    GFR, Estimated 16 (*)    All other components within normal limits  CBG MONITORING, ED - Abnormal; Notable for the following components:   Glucose-Capillary 572 (*)  All other components within normal limits  TROPONIN I (HIGH SENSITIVITY) - Abnormal;  Notable for the following components:   Troponin I (High Sensitivity) 84 (*)    All other components within normal limits  BETA-HYDROXYBUTYRIC ACID  CBG MONITORING, ED  TROPONIN I (HIGH SENSITIVITY)     EKG:  EKG Interpretation  Date/Time:  Sunday January 22 2022 04:32:05 EDT Ventricular Rate:  89 PR Interval:  166 QRS Duration: 100 QT Interval:  366 QTC Calculation: 445 R Axis:   66 Text Interpretation: Normal sinus rhythm Nonspecific ST and T wave abnormality Abnormal ECG When compared with ECG of 02-Oct-2021 06:48, PREVIOUS ECG IS PRESENT No significant change since last tracing Confirmed by Pryor Curia [phone]) on 01/22/2022 5:53:11 AM         RADIOLOGY: My personal review and interpretation of imaging: Chest x-ray negative.  Head CT shows no acute abnormality.  I have personally reviewed all radiology reports.   CT HEAD WO CONTRAST (5MM)  Result Date: 01/22/2022 CLINICAL DATA:  Headache.  Chest pain. EXAM: CT HEAD WITHOUT CONTRAST TECHNIQUE: Contiguous axial images were obtained from the base of the skull through the vertex without intravenous contrast. RADIATION DOSE REDUCTION: This exam was performed according to the departmental dose-optimization program which includes automated exposure control, adjustment of the mA and/or kV according to patient size and/or use of iterative reconstruction technique. COMPARISON:  12/26/2021 FINDINGS: Brain: No evidence of acute infarction, hemorrhage, hydrocephalus, extra-axial collection or mass lesion/mass effect. Vascular: No hyperdense vessel or unexpected calcification. Skull: Normal. Negative for fracture or focal lesion. Sinuses/Orbits: No acute finding. Other: Resolving right frontal scalp hematoma measures 4 mm in thickness. Formally this measured 10 mm. No underlying skull fracture. IMPRESSION: 1. No acute intracranial abnormalities. 2. Resolving right frontal scalp hematoma. Electronically Signed   By: Kerby Moors M.D.   On:  01/22/2022 06:33   DG Chest 2 View  Result Date: 01/22/2022 CLINICAL DATA:  Chest pain.  Shortness of breath and headache. EXAM: CHEST - 2 VIEW COMPARISON:  CT 10/02/2021 FINDINGS: There is a right chest wall dialysis catheter with tips at the cavoatrial junction. Normal cardiomediastinal contours. Aortic atherosclerotic calcifications. No pleural effusion or edema. No airspace opacities. Spondylosis noted in the thoracic spine. IMPRESSION: No active cardiopulmonary disease. Electronically Signed   By: Kerby Moors M.D.   On: 01/22/2022 05:07     PROCEDURES:  Critical Care performed: Yes, see critical care procedure note(s)   CRITICAL CARE Performed by: Cyril Mourning Ardeth Repetto   Total critical care time: 40 minutes  Critical care time was exclusive of separately billable procedures and treating other patients.  Critical care was necessary to treat or prevent imminent or life-threatening deterioration.  Critical care was time spent personally by me on the following activities: development of treatment plan with patient and/or surrogate as well as nursing, discussions with consultants, evaluation of patient's response to treatment, examination of patient, obtaining history from patient or surrogate, ordering and performing treatments and interventions, ordering and review of laboratory studies, ordering and review of radiographic studies, pulse oximetry and re-evaluation of patient's condition.   Marland Kitchen1-3 Lead EKG Interpretation  Performed by: Alaisha Eversley, Delice Bison, DO Authorized by: Keyante Durio, Delice Bison, DO     Interpretation: normal     ECG rate:  88   ECG rate assessment: normal     Rhythm: sinus rhythm     Ectopy: none     Conduction: normal       IMPRESSION / MDM / ASSESSMENT AND  PLAN / ED COURSE  I reviewed the triage vital signs and the nursing notes.    Patient here with headache, chest pain, shortness of breath in the setting of hypertension.  The patient is on the cardiac monitor to  evaluate for evidence of arrhythmia and/or significant heart rate changes.   DIFFERENTIAL DIAGNOSIS (includes but not limited to):   ACS, PE, dissection, pneumonia, CHF, pneumothorax, intracranial hemorrhage, CVA, doubt meningitis   Patient's presentation is most consistent with acute presentation with potential threat to life or bodily function.   PLAN: We will obtain CBC, BMP, troponin x2, chest x-ray, head CT.  EKG is nonischemic.  Will give IV hydralazine as well as his home medications.  Will give Tylenol for headache.   MEDICATIONS GIVEN IN ED: Medications  hydrALAZINE (APRESOLINE) injection 10 mg (10 mg Intravenous Given 01/22/22 0634)  acetaminophen (TYLENOL) tablet 1,000 mg (1,000 mg Oral Given 01/22/22 0624)  insulin aspart (novoLOG) injection 10 Units (10 Units Intravenous Given 01/22/22 0635)  hydrALAZINE (APRESOLINE) tablet 100 mg (100 mg Oral Given 01/22/22 0624)  losartan (COZAAR) tablet 50 mg (50 mg Oral Given 01/22/22 0624)  carvedilol (COREG) tablet 25 mg (25 mg Oral Given 01/22/22 1102)     ED COURSE: Patient has no leukocytosis.  Stable anemia.  Blood glucose is 621 but has normal bicarb and anion gap.  Normal electrolytes.  Beta hydroxybutyric acid level is normal.  Troponin initially elevated 84 but this could be from demand due to his hypertension and also due to chronic kidney disease.  It is much lower than it has run before - in April 2023 it was in the 200s.  Will obtain second troponin.  Chest x-ray reviewed and interpreted by myself radiologist and shows no cardiomegaly, widened mediastinum or edema.   Head CT reviewed and interpreted by myself and the radiologist and shows no acute abnormality.  Blood pressure is already improving.  We will recheck blood sugar 1 hour after receiving insulin.  Second troponin pending.  Signed out the oncoming ED physician.  CONSULTS: Dispo pending further work-up.   OUTSIDE RECORDS REVIEWED: Reviewed patient's last nephrology note with  Dr. Candiss Norse on 05/26/2021.       FINAL CLINICAL IMPRESSION(S) / ED DIAGNOSES   Final diagnoses:  Hypertension, unspecified type  Hyperglycemia  Nonspecific chest pain  Generalized headache     Rx / DC Orders   ED Discharge Orders     None        Note:  This document was prepared using Dragon voice recognition software and may include unintentional dictation errors.   Stanislawa Gaffin, Delice Bison, DO 01/22/22 [phone]

## 2022-01-22 NOTE — ED Notes (Signed)
Patient ambulatory to restroom in room by himself with no issue

## 2022-01-22 NOTE — ED Triage Notes (Signed)
Pt presents to ER from home with c/o central chest pain with radiation to left arm and left side of neck that started tonight and has been constant since and woke pt up from sleep.  Pt also endorses some sob and HA.  Pt is a T. Th, Sat dialysis pt and has not missed any tx.  Pt is A&O x4 at this time and in NAD.

## 2022-01-22 NOTE — ED Notes (Signed)
Pt and wife gives verbal consent to DC

## 2022-01-22 NOTE — ED Notes (Signed)
Attempted to walk pt in room. Pt says he is sleepy and barely opens eyes. Pt has unsteady gait. MD aware

## 2022-01-22 NOTE — ED Provider Notes (Addendum)
7:27 AM Assumed care for off going team.   Blood pressure (!) 167/83, pulse 90, temperature 98.3 F (36.8 C), temperature source Oral, resp. rate 16, weight 70.3 kg, SpO2 95 %.  See their HPI for full report but in brief pending re-eval   Troponins are slightly elevated but on repeat they are stable.  Patient's had higher troponins 3 months ago in the 200s.  I reviewed the hospital course where patient was admitted for left arm steal syndrome.  Patient is on Eliquis unlikely PE.  Patient had a catheterization in December 2022 where he does have some known coronary disease and they will consider PCI of the LAD in the future but at that time they wanted to do more aggressive medical therapy.   7:52 AM reevaluated patient.  Glucose has come down nicely.  Blood pressures have come down significantly.  Patient does report that all of this started after getting in an argument with his son around midnight.  He reports that he still is having a headache that is like his typical headaches.  He had a CT scan within 6 hours of the headache that was without evidence of any intercranial hemorrhage and this sounds like a typical migraine for him therefore do not feel like patient has an aneurysmal or subarachnoid bleed and do not feel like CTA or lumbar puncture are warranted.  His chest pain has since nearly resolved as well although he does report a burning epigastric sensation which he reports that he has had this off and on for years.  We will give patient migraine cocktail, GI cocktail and reevaluate patient's pain.  9:24 AM reevaluated patient and he is asleep in the bed.  He is easily able to be woken.  And he is feeling much better.  He reports near resolution of his headache.  He again reports that this is his baseline normal headaches that he typically gets.  He does start talking to me about some epigastric discomfort that he has had for 3 years ??  He does have slight elevation of his lipase and alk phos  so I will ultrasound just to make sure that there is no cholecystitis and CT to evaluate for pancreatitis but I SPECT this is more likely a chronic issue that needs to be followed up outpatient with GI  IMPRESSION: 1. No acute findings within the abdomen or pelvis. 2. Nonobstructing right renal calculus. 3. Subcutaneous soft tissue stranding along the ventral aspect of the lower abdominal wall with mild overlying skin thickening. Correlate for any clinical signs or symptoms of cellulitis. 4. Prostate gland enlargement. 5. Aortic Atherosclerosis (ICD10-I70.0).   Re-evaluated skin and no cellulitis noted on abdomen. Family deny seeing any rashes.  11:16 AM reevaluation patient's wife is now at bedside and he reports feeling very tired and dizzy.  We attempted to ambulate him and he is a little bit wobbly on his feet.  This could be just related to the medications and him being up all night but I will get a MRI brain to rule out stroke and if he has resolution of symptoms prior to the MRI brain we can cancer    1:23 PM patient had gone to MRI but did not want to lay flat due to being claustrophobic.  I considered giving him Ativan but I feel that this is more likely related to medications therefore we will give him in some additional time.  On my repeat assessment patient reports feeling back to his baseline  self.  He reports some fatigue but he denies any dizziness he is ambulatory without any ataxia and declines MRI and would like to go home.  I considered admission given patient's high risk for cardiac issues but given his stable troponins we have opted to hold off on admission at this time  I did update pt with spanish speaking intrepretor    Vanessa [place], MD 01/22/22 [id number]    Vanessa [place], MD 01/22/22 1324    Vanessa [place], MD 01/23/22 1239

## 2022-01-23 ENCOUNTER — Other Ambulatory Visit: Payer: Self-pay

## 2022-01-23 NOTE — Telephone Encounter (Signed)
Attempted to cancel.  Lmov for patient and wife.   8/11 not needed and will be cancelled. Lmov with interpreter to notify patient

## 2022-01-27 ENCOUNTER — Ambulatory Visit: Payer: Medicare Other | Admitting: Cardiology

## 2022-02-09 ENCOUNTER — Other Ambulatory Visit: Payer: Self-pay

## 2022-04-04 ENCOUNTER — Emergency Department: Payer: Medicare Other

## 2022-04-04 ENCOUNTER — Emergency Department
Admission: EM | Admit: 2022-04-04 | Discharge: 2022-04-04 | Disposition: A | Discharge status: Home / Self Care | Payer: Medicare Other | Attending: Emergency Medicine | Admitting: Emergency Medicine

## 2022-04-04 DIAGNOSIS — I509 Heart failure, unspecified: Secondary | ICD-10-CM | POA: Diagnosis not present

## 2022-04-04 DIAGNOSIS — Z992 Dependence on renal dialysis: Secondary | ICD-10-CM | POA: Insufficient documentation

## 2022-04-04 DIAGNOSIS — N186 End stage renal disease: Secondary | ICD-10-CM | POA: Insufficient documentation

## 2022-04-04 DIAGNOSIS — Z1152 Encounter for screening for COVID-19: Secondary | ICD-10-CM | POA: Diagnosis not present

## 2022-04-04 DIAGNOSIS — E1122 Type 2 diabetes mellitus with diabetic chronic kidney disease: Secondary | ICD-10-CM | POA: Diagnosis not present

## 2022-04-04 DIAGNOSIS — I10 Essential (primary) hypertension: Secondary | ICD-10-CM

## 2022-04-04 DIAGNOSIS — I132 Hypertensive heart and chronic kidney disease with heart failure and with stage 5 chronic kidney disease, or end stage renal disease: Secondary | ICD-10-CM | POA: Insufficient documentation

## 2022-04-04 DIAGNOSIS — R410 Disorientation, unspecified: Secondary | ICD-10-CM | POA: Insufficient documentation

## 2022-04-04 DIAGNOSIS — Z794 Long term (current) use of insulin: Secondary | ICD-10-CM | POA: Diagnosis not present

## 2022-04-04 LAB — COMPREHENSIVE METABOLIC PANEL
ALT: 18 U/L (ref 0–44)
AST: 28 U/L (ref 15–41)
Albumin: 3.7 g/dL (ref 3.5–5.0)
Alkaline Phosphatase: 81 U/L (ref 38–126)
Anion gap: 13 (ref 5–15)
BUN: 29 mg/dL — ABNORMAL HIGH (ref 6–20)
CO2: 24 mmol/L (ref 22–32)
Calcium: 8.7 mg/dL — ABNORMAL LOW (ref 8.9–10.3)
Chloride: 101 mmol/L (ref 98–111)
Creatinine, Ser: 3.03 mg/dL — ABNORMAL HIGH (ref 0.61–1.24)
GFR, Estimated: 23 mL/min — ABNORMAL LOW (ref >60)
Glucose, Bld: 231 mg/dL — ABNORMAL HIGH (ref 70–99)
Potassium: 3.7 mmol/L (ref 3.5–5.1)
Sodium: 138 mmol/L (ref 135–145)
Total Bilirubin: 1 mg/dL (ref 0.3–1.2)
Total Protein: 7.3 g/dL (ref 6.5–8.1)

## 2022-04-04 LAB — CBC WITH DIFFERENTIAL/PLATELET
Abs Immature Granulocytes: 0.01 10*3/uL (ref 0.00–0.07)
Basophils Absolute: 0 10*3/uL (ref 0.0–0.1)
Basophils Relative: 1 %
Eosinophils Absolute: 0.2 10*3/uL (ref 0.0–0.5)
Eosinophils Relative: 4 %
HCT: 32.6 % — ABNORMAL LOW (ref 39.0–52.0)
Hemoglobin: 11.4 g/dL — ABNORMAL LOW (ref 13.0–17.0)
Immature Granulocytes: 0 %
Lymphocytes Relative: 14 %
Lymphs Abs: 0.6 10*3/uL — ABNORMAL LOW (ref 0.7–4.0)
MCH: 30.8 pg (ref 26.0–34.0)
MCHC: 35 g/dL (ref 30.0–36.0)
MCV: 88.1 fL (ref 80.0–100.0)
Monocytes Absolute: 0.3 10*3/uL (ref 0.1–1.0)
Monocytes Relative: 7 %
Neutro Abs: 3.3 10*3/uL (ref 1.7–7.7)
Neutrophils Relative %: 74 %
Platelets: 204 10*3/uL (ref 150–400)
RBC: 3.7 MIL/uL — ABNORMAL LOW (ref 4.22–5.81)
RDW: 12.5 % (ref 11.5–15.5)
WBC: 4.5 10*3/uL (ref 4.0–10.5)
nRBC: 0 % (ref 0.0–0.2)

## 2022-04-04 LAB — ETHANOL: Alcohol, Ethyl (B): <10 mg/dL (ref <10)

## 2022-04-04 LAB — RESP PANEL BY RT-PCR (FLU A&B, COVID) ARPGX2
Influenza A by PCR: NEGATIVE
Influenza B by PCR: NEGATIVE
SARS Coronavirus 2 by RT PCR: NEGATIVE

## 2022-04-04 LAB — SALICYLATE LEVEL: Salicylate Lvl: <7 mg/dL — ABNORMAL LOW (ref 7.0–30.0)

## 2022-04-04 LAB — TROPONIN I (HIGH SENSITIVITY)
Troponin I (High Sensitivity): 150 ng/L (ref <18)
Troponin I (High Sensitivity): 142 ng/L (ref <18)

## 2022-04-04 LAB — PROTIME-INR
INR: 1 (ref 0.8–1.2)
Prothrombin Time: 13.1 seconds (ref 11.4–15.2)

## 2022-04-04 LAB — BRAIN NATRIURETIC PEPTIDE: B Natriuretic Peptide: 971.8 pg/mL — ABNORMAL HIGH (ref 0.0–100.0)

## 2022-04-04 LAB — ACETAMINOPHEN LEVEL: Acetaminophen (Tylenol), Serum: <10 ug/mL — ABNORMAL LOW (ref 10–30)

## 2022-04-04 MED ORDER — HYDRALAZINE HCL 50 MG PO TABS
100.0000 mg | ORAL_TABLET | Freq: Once | ORAL | Status: AC
  Administered 2022-04-04: 100 mg via ORAL
  Filled 2022-04-04: qty 2

## 2022-04-04 MED ORDER — LOSARTAN POTASSIUM 50 MG PO TABS
50.0000 mg | ORAL_TABLET | Freq: Once | ORAL | Status: AC
  Administered 2022-04-04: 50 mg via ORAL
  Filled 2022-04-04: qty 1

## 2022-04-04 MED ORDER — APIXABAN 5 MG PO TABS
5.0000 mg | ORAL_TABLET | Freq: Once | ORAL | Status: AC
  Administered 2022-04-04: 5 mg via ORAL
  Filled 2022-04-04: qty 1

## 2022-04-04 MED ORDER — ACETAMINOPHEN 500 MG PO TABS
1000.0000 mg | ORAL_TABLET | Freq: Once | ORAL | Status: AC
  Administered 2022-04-04: 1000 mg via ORAL
  Filled 2022-04-04: qty 2

## 2022-04-04 NOTE — Discharge Instructions (Addendum)
See your doctor to talk about your blood pressure and adjust medications as appropriate.  Thank you for choosing [place] for your health care today!  Please see your primary doctor this week for a follow up appointment.   If you do not have a primary doctor call the following clinics to establish care:  If you have insurance:  [facility] [phone] [name] [address]   [facility]  [phone] [address] [place] [place] [address]   If you do not have insurance:  [facility]  [phone] [address] [place] [address]  Sometimes, in the early stages of certain disease courses it is difficult to detect in the emergency department evaluation -- so, it is important that you continue to monitor your symptoms and call your doctor right away or return to the emergency department if you develop any new or worsening symptoms.  It was my pleasure to care for you today.
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
## 2022-04-04 NOTE — ED Provider Notes (Signed)
Patient was signed out to me pending repeat troponin MRI, troponin flat and MRI is negative.  Patient feels well at this time, family member at bedside.  He has a mild headache, advised to take Tylenol.  Given work-up unremarkable as above, patient is stable in the emergency department, plan is for discharge home with PMD follow-up and return precautions.   Lucillie Garfinkel, MD 04/04/22 [phone]
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
## 2022-04-10 ENCOUNTER — Other Ambulatory Visit: Payer: Self-pay

## 2022-04-11 ENCOUNTER — Other Ambulatory Visit: Payer: Self-pay

## 2022-04-13 ENCOUNTER — Other Ambulatory Visit (INDEPENDENT_AMBULATORY_CARE_PROVIDER_SITE_OTHER): Payer: Medicare Other

## 2022-04-13 ENCOUNTER — Encounter (INDEPENDENT_AMBULATORY_CARE_PROVIDER_SITE_OTHER): Payer: Medicare Other

## 2022-04-13 ENCOUNTER — Ambulatory Visit (INDEPENDENT_AMBULATORY_CARE_PROVIDER_SITE_OTHER): Payer: Medicare Other | Admitting: Nurse Practitioner

## 2022-05-17 ENCOUNTER — Inpatient Hospital Stay
Admission: EM | Admit: 2022-05-17 | Discharge: 2022-05-19 | DRG: 291 | Disposition: A | Discharge status: Home / Self Care | Payer: Medicare Other | Attending: Internal Medicine | Admitting: Internal Medicine

## 2022-05-17 ENCOUNTER — Encounter: Payer: Self-pay | Admitting: Emergency Medicine

## 2022-05-17 ENCOUNTER — Other Ambulatory Visit: Payer: Self-pay

## 2022-05-17 ENCOUNTER — Emergency Department: Payer: Medicare Other

## 2022-05-17 DIAGNOSIS — Z8616 Personal history of COVID-19: Secondary | ICD-10-CM | POA: Diagnosis not present

## 2022-05-17 DIAGNOSIS — J988 Other specified respiratory disorders: Secondary | ICD-10-CM | POA: Diagnosis not present

## 2022-05-17 DIAGNOSIS — I2510 Atherosclerotic heart disease of native coronary artery without angina pectoris: Secondary | ICD-10-CM | POA: Diagnosis present

## 2022-05-17 DIAGNOSIS — E1122 Type 2 diabetes mellitus with diabetic chronic kidney disease: Secondary | ICD-10-CM | POA: Diagnosis present

## 2022-05-17 DIAGNOSIS — I10 Essential (primary) hypertension: Secondary | ICD-10-CM

## 2022-05-17 DIAGNOSIS — I5031 Acute diastolic (congestive) heart failure: Secondary | ICD-10-CM | POA: Diagnosis not present

## 2022-05-17 DIAGNOSIS — I161 Hypertensive emergency: Secondary | ICD-10-CM | POA: Diagnosis not present

## 2022-05-17 DIAGNOSIS — Z1152 Encounter for screening for COVID-19: Secondary | ICD-10-CM

## 2022-05-17 DIAGNOSIS — I132 Hypertensive heart and chronic kidney disease with heart failure and with stage 5 chronic kidney disease, or end stage renal disease: Secondary | ICD-10-CM | POA: Diagnosis present

## 2022-05-17 DIAGNOSIS — N2581 Secondary hyperparathyroidism of renal origin: Secondary | ICD-10-CM | POA: Diagnosis present

## 2022-05-17 DIAGNOSIS — R7989 Other specified abnormal findings of blood chemistry: Secondary | ICD-10-CM | POA: Diagnosis present

## 2022-05-17 DIAGNOSIS — I5023 Acute on chronic systolic (congestive) heart failure: Secondary | ICD-10-CM | POA: Diagnosis present

## 2022-05-17 DIAGNOSIS — F32A Depression, unspecified: Secondary | ICD-10-CM | POA: Diagnosis present

## 2022-05-17 DIAGNOSIS — R0600 Dyspnea, unspecified: Principal | ICD-10-CM

## 2022-05-17 DIAGNOSIS — E1165 Type 2 diabetes mellitus with hyperglycemia: Secondary | ICD-10-CM

## 2022-05-17 DIAGNOSIS — I160 Hypertensive urgency: Secondary | ICD-10-CM | POA: Diagnosis present

## 2022-05-17 DIAGNOSIS — E11649 Type 2 diabetes mellitus with hypoglycemia without coma: Secondary | ICD-10-CM | POA: Diagnosis not present

## 2022-05-17 DIAGNOSIS — E785 Hyperlipidemia, unspecified: Secondary | ICD-10-CM | POA: Diagnosis present

## 2022-05-17 DIAGNOSIS — Z794 Long term (current) use of insulin: Secondary | ICD-10-CM

## 2022-05-17 DIAGNOSIS — N186 End stage renal disease: Secondary | ICD-10-CM | POA: Diagnosis not present

## 2022-05-17 DIAGNOSIS — I428 Other cardiomyopathies: Secondary | ICD-10-CM | POA: Diagnosis present

## 2022-05-17 DIAGNOSIS — Z833 Family history of diabetes mellitus: Secondary | ICD-10-CM

## 2022-05-17 DIAGNOSIS — F419 Anxiety disorder, unspecified: Secondary | ICD-10-CM | POA: Diagnosis present

## 2022-05-17 DIAGNOSIS — J40 Bronchitis, not specified as acute or chronic: Secondary | ICD-10-CM | POA: Diagnosis present

## 2022-05-17 DIAGNOSIS — Z992 Dependence on renal dialysis: Secondary | ICD-10-CM

## 2022-05-17 DIAGNOSIS — I5043 Acute on chronic combined systolic (congestive) and diastolic (congestive) heart failure: Secondary | ICD-10-CM | POA: Diagnosis present

## 2022-05-17 DIAGNOSIS — I252 Old myocardial infarction: Secondary | ICD-10-CM | POA: Diagnosis not present

## 2022-05-17 DIAGNOSIS — I25118 Atherosclerotic heart disease of native coronary artery with other forms of angina pectoris: Secondary | ICD-10-CM | POA: Diagnosis present

## 2022-05-17 DIAGNOSIS — Z8673 Personal history of transient ischemic attack (TIA), and cerebral infarction without residual deficits: Secondary | ICD-10-CM | POA: Diagnosis not present

## 2022-05-17 DIAGNOSIS — Z79899 Other long term (current) drug therapy: Secondary | ICD-10-CM | POA: Diagnosis not present

## 2022-05-17 DIAGNOSIS — D631 Anemia in chronic kidney disease: Secondary | ICD-10-CM | POA: Diagnosis present

## 2022-05-17 DIAGNOSIS — Z86718 Personal history of other venous thrombosis and embolism: Secondary | ICD-10-CM

## 2022-05-17 DIAGNOSIS — Z841 Family history of disorders of kidney and ureter: Secondary | ICD-10-CM | POA: Diagnosis not present

## 2022-05-17 DIAGNOSIS — Z7901 Long term (current) use of anticoagulants: Secondary | ICD-10-CM

## 2022-05-17 DIAGNOSIS — Z8249 Family history of ischemic heart disease and other diseases of the circulatory system: Secondary | ICD-10-CM

## 2022-05-17 LAB — CBC WITH DIFFERENTIAL/PLATELET
Abs Immature Granulocytes: 0.03 10*3/uL (ref 0.00–0.07)
Basophils Absolute: 0 10*3/uL (ref 0.0–0.1)
Basophils Relative: 1 %
Eosinophils Absolute: 0.2 10*3/uL (ref 0.0–0.5)
Eosinophils Relative: 4 %
HCT: 31.1 % — ABNORMAL LOW (ref 39.0–52.0)
Hemoglobin: 10.9 g/dL — ABNORMAL LOW (ref 13.0–17.0)
Immature Granulocytes: 1 %
Lymphocytes Relative: 17 %
Lymphs Abs: 1.1 10*3/uL (ref 0.7–4.0)
MCH: 31.1 pg (ref 26.0–34.0)
MCHC: 35 g/dL (ref 30.0–36.0)
MCV: 88.6 fL (ref 80.0–100.0)
Monocytes Absolute: 0.6 10*3/uL (ref 0.1–1.0)
Monocytes Relative: 9 %
Neutro Abs: 4.4 10*3/uL (ref 1.7–7.7)
Neutrophils Relative %: 68 %
Platelets: 190 10*3/uL (ref 150–400)
RBC: 3.51 MIL/uL — ABNORMAL LOW (ref 4.22–5.81)
RDW: 12.8 % (ref 11.5–15.5)
WBC: 6.4 10*3/uL (ref 4.0–10.5)
nRBC: 0 % (ref 0.0–0.2)

## 2022-05-17 LAB — TROPONIN I (HIGH SENSITIVITY)
Troponin I (High Sensitivity): 200 ng/L (ref <18)
Troponin I (High Sensitivity): 197 ng/L (ref <18)

## 2022-05-17 LAB — PROCALCITONIN: Procalcitonin: 0.2 ng/mL

## 2022-05-17 LAB — BASIC METABOLIC PANEL
Anion gap: 9 (ref 5–15)
BUN: 51 mg/dL — ABNORMAL HIGH (ref 6–20)
CO2: 24 mmol/L (ref 22–32)
Calcium: 9.2 mg/dL (ref 8.9–10.3)
Chloride: 104 mmol/L (ref 98–111)
Creatinine, Ser: 4.72 mg/dL — ABNORMAL HIGH (ref 0.61–1.24)
GFR, Estimated: 13 mL/min — ABNORMAL LOW (ref >60)
Glucose, Bld: 399 mg/dL — ABNORMAL HIGH (ref 70–99)
Potassium: 4.6 mmol/L (ref 3.5–5.1)
Sodium: 137 mmol/L (ref 135–145)

## 2022-05-17 LAB — CBC
HCT: 31.2 % — ABNORMAL LOW (ref 39.0–52.0)
Hemoglobin: 10.7 g/dL — ABNORMAL LOW (ref 13.0–17.0)
MCH: 31.1 pg (ref 26.0–34.0)
MCHC: 34.3 g/dL (ref 30.0–36.0)
MCV: 90.7 fL (ref 80.0–100.0)
Platelets: 191 10*3/uL (ref 150–400)
RBC: 3.44 MIL/uL — ABNORMAL LOW (ref 4.22–5.81)
RDW: 13 % (ref 11.5–15.5)
WBC: 4.8 10*3/uL (ref 4.0–10.5)
nRBC: 0 % (ref 0.0–0.2)

## 2022-05-17 LAB — RESP PANEL BY RT-PCR (FLU A&B, COVID) ARPGX2
Influenza A by PCR: NEGATIVE
Influenza B by PCR: NEGATIVE
SARS Coronavirus 2 by RT PCR: NEGATIVE

## 2022-05-17 LAB — BRAIN NATRIURETIC PEPTIDE: B Natriuretic Peptide: 3594.5 pg/mL — ABNORMAL HIGH (ref 0.0–100.0)

## 2022-05-17 MED ORDER — FUROSEMIDE 10 MG/ML IJ SOLN
40.0000 mg | Freq: Once | INTRAMUSCULAR | Status: AC
  Administered 2022-05-17: 40 mg via INTRAVENOUS
  Filled 2022-05-17: qty 4

## 2022-05-17 MED ORDER — LABETALOL HCL 5 MG/ML IV SOLN
5.0000 mg | Freq: Once | INTRAVENOUS | Status: AC
  Administered 2022-05-17: 5 mg via INTRAVENOUS
  Filled 2022-05-17: qty 4

## 2022-05-17 MED ORDER — SODIUM CHLORIDE 0.9 % IV SOLN
1.0000 g | Freq: Once | INTRAVENOUS | Status: AC
  Administered 2022-05-17: 1 g via INTRAVENOUS
  Filled 2022-05-17: qty 10

## 2022-05-17 MED ORDER — SODIUM CHLORIDE 0.9 % IV SOLN: 500.0000 mg | Freq: Once | INTRAVENOUS | Status: DC

## 2022-05-17 NOTE — H&P (Addendum)
History and Physical    Patient: Jimmy Olson KZS:[id number] DOB: [birth year] DOA: 05/17/2022 DOS: the patient was seen and examined on 05/17/2022 PCP: [facility] [place]  Patient coming from: Home  Chief Complaint:  Chief Complaint  Patient presents with   Shortness of Breath    HPI: Jimmy Olson is a 61 y.o. male with medical history significant for Insulin-dependent type 2 diabetes   (EF 45 to 50%, G1DD 07/15/21), ESRD on HD TTS, anemia of CKD,  two-vessel CAD on LHC 06/2020, depression, DVT of axillary vein 05/2021 on Eliquis, TIA 06/2021, who presents to the ED for evaluation of shortness of breath.  Patient states he has been having shortness of breath for over a week but it became acutely worse on the night of arrival.  He denies lower extremity edema or orthopnea and he has not missed any dialysis sessions.  He also has been having a congested cough productive of clear phlegm.  He denies fever or chills.  He endorses chest pressure associated with a cough.  He has nausea and has been spitting up clear phlegm but has not vomited.  Denies abdominal pain or diarrhea. ED course and data review: BP as high as 224/111 (did not take medication today but usually compliant).  Tachypneic to 27 with otherwise normal vitals.  Troponin 200>197.  BNP 3594.  CBC unremarkable except for baseline hemoglobin of 10.9.  Procalcitonin 0.20.  COVID and flu negative.  Blood glucose 399. EKG, personally viewed and interpreted shows NSR at 64 with no ischemic ST-T wave changes.   Chest x-ray shows cardiomegaly and mild interstitial edema.  Patient was treated with a dose of Lasix 40 mg IV, labetalol 5 mg IV for his blood pressure and given Rocephin 1 g for possibility of respiratory tract infection.  Hospitalist consulted for admission.   Review of Systems: As mentioned in the history of present illness. All other systems reviewed and are negative.  Past Medical History:  Diagnosis Date    Anemia    Anxiety and depression    ATN (acute tubular necrosis) (HCC)    CAD (coronary artery disease)    a.) R/LHC 07/19/2020: 20% pRCA, 30% mRCA, 50% dRCA, 50% RPAV, 40% pLAD, 70% D1, 60% mLAD; LVEDP 19 mmHg; PA 37/16 (26 mmHg); PCWP 13 mmHg. b.) LHC 06/03/2021: EF 45-50%; LVEDP 18 mmHg; 40% pLAD, 60% mLAD, 20% pRCA, 30% mRCA, 70% D1, 30% dRCA, 40% RPDA.   Chronic HFrEF (heart failure with reduced ejection fraction) ([place])    a.) 06/2020 Echo: EF 30-35%, glob HK, GrII DD, nl RV fxn. Mild LAE. b.)  TTE 08/08/2021: EF 40-45%; global HK; mild LVH; GLS -8.6; mild LA dilation.   DVT of axillary vein, acute left ([place]) 05/2021   a.) Tx'd with apixaban   ESRD (end stage renal disease) on dialysis [facility])    a.) M-W-F   History of 2019 novel coronavirus disease (COVID-19) 03/06/2020   a.) s/p Tx with monoclonal Ab infusion   HLD (hyperlipidemia)    Hyperkalemia 12/23/2020   Hypertension    Left carotid artery stenosis 08/18/2021   a.) Carotid Doppler 73/22/0254: 27-06% LICA stenosis   Long term current use of anticoagulant    a.) apixaban for DVT   NICM (nonischemic cardiomyopathy) ([place])    a.) 07/2009 MV: EF 48%; b.) 08/2019 Echo: EF 45-50%. Global HK. Mod LVH. GrIDD; c.) 06/2020 Echo: EF 30-35%, glob HK, mild LVH, GrII DD, nl RV fxn, mild LAE; d.) 06/2020 Cath:  nonobs dzs. e.)  LHC 06/03/2021; EF 45-50%; LVEDP 18 mmHg; nonobstructive CAD. f.) b.)  TTE 08/08/2021: EF 40-45%; global HK; mild LVH; GLS -8.6; mild LA dilation.   NSTEMI (non-ST elevated myocardial infarction) ([place]) 07/15/2020   a.) hsTnI trended: 124--> 127--> 176 --> 190 --> 200 ng/L.   Sepsis ([place])    Suicidal ideations    TIA (transient ischemic attack) 06/2021   Type 2 diabetes mellitus treated with insulin [facility])    Past Surgical History:  Procedure Laterality Date   APPENDECTOMY     AV FISTULA PLACEMENT Left 09/29/2021   Procedure: ARTERIOVENOUS (AV) FISTULA CREATION ( RADIAL CEPHALIC);  Surgeon: Algernon Huxley, MD;  Location: [facility]  ORS;  Service: Vascular;  Laterality: Left;   CARDIAC CATHETERIZATION     DIALYSIS/PERMA CATHETER INSERTION N/A 06/02/2021   Procedure: DIALYSIS/PERMA CATHETER INSERTION;  Surgeon: Algernon Huxley, MD;  Location: [place] CV LAB;  Service: Cardiovascular;  Laterality: N/A;   LEFT HEART CATH AND CORONARY ANGIOGRAPHY N/A 06/03/2021   Procedure: LEFT HEART CATH AND CORONARY ANGIOGRAPHY;  Surgeon: Wellington Hampshire, MD;  Location: [place] CV LAB;  Service: Cardiovascular;  Laterality: N/A;   LIGATION OF ARTERIOVENOUS  FISTULA Left 10/02/2021   Procedure: LIGATION OF ARTERIOVENOUS  FISTULA;  Surgeon: Evaristo Bury, MD;  Location: [facility] ORS;  Service: Vascular;  Laterality: Left;   NASAL SINUS SURGERY     RIGHT/LEFT HEART CATH AND CORONARY ANGIOGRAPHY N/A 07/19/2020   Procedure: RIGHT/LEFT HEART CATH AND CORONARY ANGIOGRAPHY;  Surgeon: Wellington Hampshire, MD;  Location: [place] CV LAB;  Service: Cardiovascular;  Laterality: N/A;   Social History:  reports that he has never smoked. He has never used smokeless tobacco. He reports that he does not currently use alcohol. He reports that he does not use drugs.  No Known Allergies  Family History  Problem Relation Age of Onset   Kidney failure Mother        died @ 45   Heart failure Mother    Other Father        he never knew his father   Diabetes Brother     Prior to Admission medications   Medication Sig Start Date End Date Taking? Authorizing Provider  acetaminophen (TYLENOL) 325 MG tablet Take 650 mg by mouth every 6 (six) hours as needed.    [provider]  albuterol (PROVENTIL HFA) 108 (90 Base) MCG/ACT inhaler INHALE 2 PUFFS INTO THE LUNGS ONCE EVERY 6 HOURS AS NEEDED FOR WHEEZING OR SHORTNESS OF BREATH. 07/13/21     apixaban (ELIQUIS) 5 MG TABS tablet TAKE 1 TABLET BY MOUTH TWICE DAILY. Patient taking differently: Take 5 mg by mouth daily. 07/11/21     carvedilol (COREG) 25 MG tablet Take 1 tablet (25 mg total) by  mouth 2 (two) times daily. 11/29/21   Alisa Graff, FNP  escitalopram (LEXAPRO) 5 MG tablet Take 1 tablet (5 mg total) by mouth at bedtime. 10/08/21   Ezekiel Slocumb, DO  ezetimibe (ZETIA) 10 MG tablet Take 10 mg by mouth daily. 08/26/21   [provider]  HUMALOG KWIKPEN 100 UNIT/ML KwikPen Inject 3 Units into the skin 3 (three) times daily. Sliding scale 07/11/21   [provider]  hydrALAZINE (APRESOLINE) 100 MG tablet Take 1 tablet (100 mg total) by mouth once every 8 (eight) hours. Patient taking differently: Take 100 mg by mouth 2 (two) times daily. 07/25/21   Minna Merritts, MD  HYDROcodone-acetaminophen (NORCO/VICODIN) 5-325 MG  tablet Take 2 tablets by mouth every 6 (six) hours as needed for moderate pain. Patient not taking: Reported on 11/29/2021 09/29/21 09/29/22  Algernon Huxley, MD  insulin detemir (LEVEMIR FLEXPEN) 100 UNIT/ML FlexPen Inject 20 Units into the skin once daily at bedtime. Patient taking differently: Inject 25 Units into the skin at bedtime. 10/10/21 03/24/22    Insulin Pen Needle 32G X 4 MM MISC USE AS DIRECTED 06/07/21   Ralene Muskrat B, MD  isosorbide mononitrate (IMDUR) 30 MG 24 hr tablet Take 1 tablet (30 mg total) by mouth in the morning and at bedtime. 11/29/21   Alisa Graff, FNP  losartan (COZAAR) 50 MG tablet Take 1 tablet (50 mg total) by mouth at bedtime. 10/08/21   Ezekiel Slocumb, DO  multivitamin (RENA-VIT) TABS tablet Take 1 tablet by mouth at bedtime. Patient not taking: Reported on 11/29/2021 10/08/21   Ezekiel Slocumb, DO  Naphazoline-Pheniramine (OPCON-A) 0.027-0.315 % SOLN Apply 2 drops to eye at bedtime.    [provider]  nitroGLYCERIN (NITROSTAT) 0.4 MG SL tablet Place 1 tablet (0.4 mg total) under the tongue every 5 (five) minutes as needed for chest pain. 07/25/20   Jennye Boroughs, MD  Nutritional Supplements (FEEDING SUPPLEMENT, NEPRO CARB STEADY,) LIQD Take 237 mLs by mouth 3 (three) times daily between meals.  10/08/21   Ezekiel Slocumb, DO  pantoprazole (PROTONIX) 20 MG tablet Take 1 tablet (20 mg total) by mouth daily. 01/22/22 02/21/22  Vanessa Rutland, MD  rosuvastatin (CRESTOR) 10 MG tablet Take 1 tablet (10 mg total) by mouth daily. 10/09/21   Ezekiel Slocumb, DO  torsemide (DEMADEX) 20 MG tablet Take 20 mg by mouth daily as needed. Takes on Sat and Sun    [provider]  umeclidinium bromide (INCRUSE ELLIPTA) 62.5 MCG/ACT AEPB Inhale 1 puff into the lungs 2 (two) times daily.    [provider]  gabapentin (NEURONTIN) 300 MG capsule Take 1 capsule (300 mg total) by mouth at bedtime for 7 days. 06/07/21 08/18/21  Sidney Ace, MD  isosorbide dinitrate (ISORDIL) 30 MG tablet TAKE 1 TABLET BY MOUTH ONCE DAILY. 07/12/21 07/13/21      Physical Exam: Vitals:   05/17/22 2115 05/17/22 2225 05/17/22 2230 05/17/22 2300  BP: (!) 224/111 (!) 212/101 (!) 205/95 (!) 206/90  Pulse: 86 85 81 76  Resp: (!) 22 19 (!) 27 19  Temp:      TempSrc:      SpO2: 99% 96%  98%  Weight:      Height:       Physical Exam Vitals and nursing note reviewed.  Constitutional:      General: He is not in acute distress.    Comments: Chronically ill-appearing male  HENT:     Head: Normocephalic and atraumatic.  Cardiovascular:     Rate and Rhythm: Normal rate and regular rhythm.     Heart sounds: Normal heart sounds.  Pulmonary:     Effort: Tachypnea present.     Breath sounds: Rales present.  Abdominal:     Palpations: Abdomen is soft.     Tenderness: There is no abdominal tenderness.  Musculoskeletal:     Right lower leg: No edema.     Left lower leg: No edema.  Neurological:     Mental Status: Mental status is at baseline.     Labs on Admission: I have personally reviewed following labs and imaging studies  CBC: Recent Labs  Lab 05/17/22 2023  05/17/22 2144  WBC 4.8 6.4  NEUTROABS  --  4.4  HGB 10.7* 10.9*  HCT 31.2* 31.1*  MCV 90.7 88.6  PLT 191 681   Basic Metabolic  Panel: Recent Labs  Lab 05/17/22 2023  NA 137  K 4.6  CL 104  CO2 24  GLUCOSE 399*  BUN 51*  CREATININE 4.72*  CALCIUM 9.2   GFR: Estimated Creatinine Clearance: 16.7 mL/min (A) (by C-G formula based on SCr of 4.72 mg/dL (H)). Liver Function Tests: No results for input(s): "AST", "ALT", "ALKPHOS", "BILITOT", "PROT", "ALBUMIN" in the last 168 hours. No results for input(s): "LIPASE", "AMYLASE" in the last 168 hours. No results for input(s): "AMMONIA" in the last 168 hours. Coagulation Profile: No results for input(s): "INR", "PROTIME" in the last 168 hours. Cardiac Enzymes: No results for input(s): "CKTOTAL", "CKMB", "CKMBINDEX", "TROPONINI" in the last 168 hours. BNP (last 3 results) No results for input(s): "PROBNP" in the last 8760 hours. HbA1C: No results for input(s): "HGBA1C" in the last 72 hours. CBG: No results for input(s): "GLUCAP" in the last 168 hours. Lipid Profile: No results for input(s): "CHOL", "HDL", "LDLCALC", "TRIG", "CHOLHDL", "LDLDIRECT" in the last 72 hours. Thyroid Function Tests: No results for input(s): "TSH", "T4TOTAL", "FREET4", "T3FREE", "THYROIDAB" in the last 72 hours. Anemia Panel: No results for input(s): "VITAMINB12", "FOLATE", "FERRITIN", "TIBC", "IRON", "RETICCTPCT" in the last 72 hours. Urine analysis: No results found for: "COLORURINE", "APPEARANCEUR", "LABSPEC", "PHURINE", "GLUCOSEU", "HGBUR", "BILIRUBINUR", "KETONESUR", "PROTEINUR", "UROBILINOGEN", "NITRITE", "LEUKOCYTESUR"  Radiological Exams on Admission: DG Chest 1 View  Result Date: 05/17/2022 CLINICAL DATA:  Cough and shortness of breath EXAM: CHEST  1 VIEW COMPARISON:  Radiographs 04/04/2022 FINDINGS: Stable cardiomegaly. Aortic atherosclerotic calcification. Right IJ CVC tip in the right atrium. Mild bilateral interstitial opacities. No focal consolidation, pleural effusion, or pneumothorax. IMPRESSION: Cardiomegaly.  Mild interstitial edema. Electronically Signed   By: Placido Sou M.D.   On: 05/17/2022 20:37     Data Reviewed: Relevant notes from primary care and specialist visits, past discharge summaries as available in EHR, including Care Everywhere. Prior diagnostic testing as pertinent to current admission diagnoses Updated medications and problem lists for reconciliation ED course, including vitals, labs, imaging, treatment and response to treatment Triage notes, nursing and pharmacy notes and ED provider's notes Notable results as noted in HPI   Assessment and Plan: * Acute on chronic combined systolic and diastolic CHF (congestive heart failure) ([place]) Secondary to hypertensive emergency, doubt primary ACS.  No missed dialysis sessions IV Lasix Continue home carvedilol, hydralazine, Imdur Daily weights with intake and output monitoring Blood pressure control Expecting further improvement with dialysis tomorrow 11/30 We will repeat echocardiogram  Respiratory tract infection, possible pneumonia Patient has cough and congestion but no fever or chills and no leukocytosis but procalcitonin slightly elevated at 0.2 We will continue Rocephin and azithromycin started in the ED Antitussives, albuterol as needed, monitor pulse ox on supplemental O2 if needed   Elevated troponin  Coronary artery disease Patient endorses chest pressure with coughing, EKG nonacute, troponin with non-ACS trend 200-197 Suspect demand ischemia  not currently on aspirin likely due to systemic anticoagulation Continue rosuvastatin, isosorbide Two-vessel CAD on [facility] 06/2020 Echo being ordered we will monitor for focal abnormal   Uncontrolled type 2 diabetes mellitus with hyperglycemia, with long-term current use of insulin (HCC) Blood sugar 399  Continue basal Levemir Sliding scale insulin    Hypertensive emergency BP 224/111 on arrival Received IV labetalol 5 mg in the ED Resuming home antihypertensives  of carvedilol, hydralazine, Imdur Receiving IV Lasix As needed  hydralazine IV   Anemia of chronic kidney failure, stage 5 (HCC) Hemoglobin stable and at baseline.  Continue to monitor    Chronic anticoagulation secondary to left axillary vein DVT 05/2021 Continue Eliquis   ESRD on hemodialysis [facility]) Nephrology consult for continuation of dialysis       DVT prophylaxis: Apixaban  Consults: Nephrology. Harmeet Singh  Advance Care Planning:   Code Status: Prior   Family Communication: Wife at bedside  Disposition Plan: Back to previous home environment  Severity of Illness: The appropriate patient status for this patient is INPATIENT. Inpatient status is judged to be reasonable and necessary in order to provide the required intensity of service to ensure the patient's safety. The patient's presenting symptoms, physical exam findings, and initial radiographic and laboratory data in the context of their chronic comorbidities is felt to place them at high risk for further clinical deterioration. Furthermore, it is not anticipated that the patient will be medically stable for discharge from the hospital within 2 midnights of admission.   * I certify that at the point of admission it is my clinical judgment that the patient will require inpatient hospital care spanning beyond 2 midnights from the point of admission due to high intensity of service, high risk for further deterioration and high frequency of surveillance required.*  Author: Athena Masse, MD 05/17/2022 11:44 PM  For on call review [email or web address].

## 2022-05-17 NOTE — ED Triage Notes (Signed)
Pt presents via POV with complaints of SOB that started last week with a productive cough. Hx of CHF & Dialysis patient (Tues/Thur/Sat). Pt states he went yesterday and had no complications. Pt still makes urine. Denies CP.

## 2022-05-17 NOTE — ED Provider Notes (Signed)
[facility] Provider Note    Event Date/Time   First MD Initiated Contact with Patient 05/17/22 2118     (approximate)   History   Shortness of Breath   HPI  Jimmy Olson is a 61 y.o. male who has end-stage renal disease and is on dialysis.  He has had a productive cough for several days and has felt short of breath.  He has not run a fever.  Cough is productive of thin watery phlegm.  Patient has not looked at it.  Patient has some chest tightness with exertion.  Patient had dialysis yesterday without incident.      Physical Exam   Triage Vital Signs: ED Triage Vitals  Enc Vitals Group     BP 05/17/22 2021 (!) 174/101     Pulse Rate 05/17/22 2021 84     Resp 05/17/22 2021 (!) 26     Temp 05/17/22 2021 98.2 F (36.8 C)     Temp Source 05/17/22 2021 Oral     SpO2 05/17/22 2021 100 %     Weight 05/17/22 2019 175 lb 7.8 oz (79.6 kg)     Height 05/17/22 2019 5\' 6"  (1.676 m)     Head Circumference --      Peak Flow --      Pain Score 05/17/22 2019 0     Pain Loc --      Pain Edu? --      Excl. in [place]? --     Most recent vital signs: Vitals:   05/17/22 2230 05/17/22 2300  BP: (!) 205/95 (!) 206/90  Pulse: 81 76  Resp: (!) 27 19  Temp:    SpO2:  98%     General: Awake, no distress.  CV:  Good peripheral perfusion.  Heart regular rate and rhythm no audible murmurs Resp:  Normal effort.  Occasional scattered crackles in the lungs Abd:  No distention.  Soft and nontender Extremities no edema   ED Results / Procedures / Treatments   Labs (all labs ordered are listed, but only abnormal results are displayed) Labs Reviewed  BASIC METABOLIC PANEL - Abnormal; Notable for the following components:      Result Value   Glucose, Bld 399 (*)    BUN 51 (*)    Creatinine, Ser 4.72 (*)    GFR, Estimated 13 (*)    All other components within normal limits  CBC - Abnormal; Notable for the following components:   RBC 3.44 (*)    Hemoglobin  10.7 (*)    HCT 31.2 (*)    All other components within normal limits  BRAIN NATRIURETIC PEPTIDE - Abnormal; Notable for the following components:   B Natriuretic Peptide 3,594.5 (*)    All other components within normal limits  CBC WITH DIFFERENTIAL/PLATELET - Abnormal; Notable for the following components:   RBC 3.51 (*)    Hemoglobin 10.9 (*)    HCT 31.1 (*)    All other components within normal limits  TROPONIN I (HIGH SENSITIVITY) - Abnormal; Notable for the following components:   Troponin I (High Sensitivity) 200 (*)    All other components within normal limits  TROPONIN I (HIGH SENSITIVITY) - Abnormal; Notable for the following components:   Troponin I (High Sensitivity) 197 (*)    All other components within normal limits  RESP PANEL BY RT-PCR (FLU A&B, COVID) ARPGX2  PROCALCITONIN  DIFFERENTIAL     EKG EKG read interpreted by me shows normal  sinus rhythm rate of 84 normal axis there are some nonspecific ST-T wave changes present similar to 1 from last month.    RADIOLOGY Chest x-ray read by radiology reviewed and interpreted by me shows cardiomegaly and mild interstitial edema.   PROCEDURES:  Critical Care performed:   Procedures   MEDICATIONS ORDERED IN ED: Medications  labetalol (NORMODYNE) injection 5 mg (has no administration in time range)  cefTRIAXone (ROCEPHIN) 1 g in sodium chloride 0.9 % 100 mL IVPB (has no administration in time range)  azithromycin (ZITHROMAX) 500 mg in sodium chloride 0.9 % 250 mL IVPB (has no administration in time range)  furosemide (LASIX) injection 40 mg (40 mg Intravenous Given 05/17/22 2157)     IMPRESSION / MDM / ASSESSMENT AND PLAN / ED COURSE  I reviewed the triage vital signs and the nursing notes. Patient's O2 sats are in the high 90s to 100 on room air.  Patient's blood pressure is very high currently 615 systolic.  I have given him his evening medicines that he has not had yet.  Additionally we will give him some IV  Lasix and keep an eye on his blood pressure.  We will repeat his troponin in the past has been 1 40-1 50s to 100 now.  ----------------------------------------- 11:07 PM on 05/17/2022 ----------------------------------------- Patient's second troponin has come back at 197 this is very slightly lower than the first.  Patient's procalcitonin is somewhat elevated at 0.2.  Possibly his chest x-ray findings could be due to a pneumonia.  He is coughing and was short of breath.  He has not put out any urine as of yet.  His blood pressures come down from 2 24-2 05.  We will give him some labetalol.  I will plan on admitting him for possible dialysis again in the morning.  I do not think he needs it emergently tonight.  Patient's presentation is most consistent with acute presentation with potential threat to life or bodily function.  The patient is on the cardiac monitor to evaluate for evidence of arrhythmia and/or significant heart rate changes     FINAL CLINICAL IMPRESSION(S) / ED DIAGNOSES   Final diagnoses:  Dyspnea, unspecified type  Elevated procalcitonin  Severe hypertension     Rx / DC Orders   ED Discharge Orders     None        Note:  This document was prepared using Dragon voice recognition software and may include unintentional dictation errors.   Nena Polio, MD 05/17/22 2308

## 2022-05-18 ENCOUNTER — Inpatient Hospital Stay (HOSPITAL_COMMUNITY)
Admit: 2022-05-18 | Discharge: 2022-05-18 | Disposition: A | Discharge status: Home / Self Care | Payer: Medicare Other | Attending: Internal Medicine | Admitting: Internal Medicine

## 2022-05-18 ENCOUNTER — Other Ambulatory Visit: Payer: Self-pay

## 2022-05-18 DIAGNOSIS — I161 Hypertensive emergency: Secondary | ICD-10-CM | POA: Diagnosis not present

## 2022-05-18 DIAGNOSIS — J988 Other specified respiratory disorders: Secondary | ICD-10-CM

## 2022-05-18 DIAGNOSIS — I5031 Acute diastolic (congestive) heart failure: Secondary | ICD-10-CM

## 2022-05-18 DIAGNOSIS — I5023 Acute on chronic systolic (congestive) heart failure: Secondary | ICD-10-CM | POA: Diagnosis not present

## 2022-05-18 DIAGNOSIS — I160 Hypertensive urgency: Secondary | ICD-10-CM | POA: Diagnosis present

## 2022-05-18 DIAGNOSIS — N186 End stage renal disease: Secondary | ICD-10-CM

## 2022-05-18 DIAGNOSIS — Z992 Dependence on renal dialysis: Secondary | ICD-10-CM

## 2022-05-18 LAB — CBC WITH DIFFERENTIAL/PLATELET
Abs Immature Granulocytes: 0.02 10*3/uL (ref 0.00–0.07)
Basophils Absolute: 0 10*3/uL (ref 0.0–0.1)
Basophils Relative: 1 %
Eosinophils Absolute: 0.2 10*3/uL (ref 0.0–0.5)
Eosinophils Relative: 5 %
HCT: 27.6 % — ABNORMAL LOW (ref 39.0–52.0)
Hemoglobin: 9.5 g/dL — ABNORMAL LOW (ref 13.0–17.0)
Immature Granulocytes: 0 %
Lymphocytes Relative: 23 %
Lymphs Abs: 1.1 10*3/uL (ref 0.7–4.0)
MCH: 31.1 pg (ref 26.0–34.0)
MCHC: 34.4 g/dL (ref 30.0–36.0)
MCV: 90.5 fL (ref 80.0–100.0)
Monocytes Absolute: 0.5 10*3/uL (ref 0.1–1.0)
Monocytes Relative: 9 %
Neutro Abs: 3 10*3/uL (ref 1.7–7.7)
Neutrophils Relative %: 62 %
Platelets: 170 10*3/uL (ref 150–400)
RBC: 3.05 MIL/uL — ABNORMAL LOW (ref 4.22–5.81)
RDW: 12.9 % (ref 11.5–15.5)
WBC: 4.9 10*3/uL (ref 4.0–10.5)
nRBC: 0 % (ref 0.0–0.2)

## 2022-05-18 LAB — HEMOGLOBIN A1C
Hgb A1c MFr Bld: 10.1 % — ABNORMAL HIGH (ref 4.8–5.6)
Mean Plasma Glucose: 243 mg/dL

## 2022-05-18 LAB — RENAL FUNCTION PANEL
Albumin: 3.1 g/dL — ABNORMAL LOW (ref 3.5–5.0)
Anion gap: 8 (ref 5–15)
BUN: 52 mg/dL — ABNORMAL HIGH (ref 6–20)
CO2: 25 mmol/L (ref 22–32)
Calcium: 8.4 mg/dL — ABNORMAL LOW (ref 8.9–10.3)
Chloride: 107 mmol/L (ref 98–111)
Creatinine, Ser: 4.87 mg/dL — ABNORMAL HIGH (ref 0.61–1.24)
GFR, Estimated: 13 mL/min — ABNORMAL LOW (ref >60)
Glucose, Bld: 137 mg/dL — ABNORMAL HIGH (ref 70–99)
Phosphorus: 4.6 mg/dL (ref 2.5–4.6)
Potassium: 3.6 mmol/L (ref 3.5–5.1)
Sodium: 140 mmol/L (ref 135–145)

## 2022-05-18 LAB — CBG MONITORING, ED
Glucose-Capillary: 330 mg/dL — ABNORMAL HIGH (ref 70–99)
Glucose-Capillary: 84 mg/dL (ref 70–99)
Glucose-Capillary: 231 mg/dL — ABNORMAL HIGH (ref 70–99)
Glucose-Capillary: 197 mg/dL — ABNORMAL HIGH (ref 70–99)
Glucose-Capillary: 109 mg/dL — ABNORMAL HIGH (ref 70–99)

## 2022-05-18 LAB — ECHOCARDIOGRAM COMPLETE
AR max vel: 1.97 cm2
AV Area VTI: 2.22 cm2
AV Area mean vel: 2.29 cm2
AV Mean grad: 2 mmHg
AV Peak grad: 3.7 mmHg
Ao pk vel: 0.97 m/s
Area-P 1/2: 3.72 cm2
Calc EF: 47.2 %
Height: 66 in
S' Lateral: 3.7 cm
Single Plane A2C EF: 49 %
Single Plane A4C EF: 43.6 %
Weight: 2356.28 oz

## 2022-05-18 LAB — HEPATITIS B SURFACE ANTIGEN: Hepatitis B Surface Ag: NONREACTIVE

## 2022-05-18 LAB — HIV ANTIBODY (ROUTINE TESTING W REFLEX): HIV Screen 4th Generation wRfx: NONREACTIVE

## 2022-05-18 LAB — TROPONIN I (HIGH SENSITIVITY)
Troponin I (High Sensitivity): 197 ng/L (ref <18)
Troponin I (High Sensitivity): 211 ng/L (ref <18)

## 2022-05-18 MED ORDER — LIDOCAINE-PRILOCAINE 2.5-2.5 % EX CREA: 1.0000 | TOPICAL_CREAM | CUTANEOUS | Status: DC | PRN

## 2022-05-18 MED ORDER — ALTEPLASE 2 MG IJ SOLR: 2.0000 mg | Freq: Once | INTRAMUSCULAR | Status: DC | PRN

## 2022-05-18 MED ORDER — INSULIN ASPART 100 UNIT/ML IJ SOLN
0.0000 [IU] | Freq: Three times a day (TID) | INTRAMUSCULAR | Status: DC
  Administered 2022-05-18: 3 [IU] via SUBCUTANEOUS
  Administered 2022-05-19: 8 [IU] via SUBCUTANEOUS
  Filled 2022-05-18 (×3): qty 1

## 2022-05-18 MED ORDER — PENTAFLUOROPROP-TETRAFLUOROETH EX AERO: 1.0000 | INHALATION_SPRAY | CUTANEOUS | Status: DC | PRN

## 2022-05-18 MED ORDER — ACETAMINOPHEN 325 MG PO TABS
650.0000 mg | ORAL_TABLET | Freq: Four times a day (QID) | ORAL | Status: DC | PRN
  Administered 2022-05-18: 650 mg via ORAL
  Filled 2022-05-18: qty 2

## 2022-05-18 MED ORDER — LOSARTAN POTASSIUM 50 MG PO TABS
100.0000 mg | ORAL_TABLET | Freq: Every day | ORAL | Status: DC
  Administered 2022-05-18: 100 mg via ORAL
  Filled 2022-05-18: qty 2

## 2022-05-18 MED ORDER — EZETIMIBE 10 MG PO TABS: 10.0000 mg | ORAL_TABLET | Freq: Every day | ORAL | Status: DC

## 2022-05-18 MED ORDER — CARVEDILOL 25 MG PO TABS
25.0000 mg | ORAL_TABLET | Freq: Two times a day (BID) | ORAL | Status: DC
  Administered 2022-05-18 – 2022-05-19 (×4): 25 mg via ORAL
  Filled 2022-05-18: qty 1
  Filled 2022-05-18 (×2): qty 4
  Filled 2022-05-18: qty 1

## 2022-05-18 MED ORDER — ONDANSETRON HCL 4 MG/2ML IJ SOLN: 4.0000 mg | Freq: Four times a day (QID) | INTRAMUSCULAR | Status: DC | PRN

## 2022-05-18 MED ORDER — ONDANSETRON HCL 4 MG PO TABS: 4.0000 mg | ORAL_TABLET | Freq: Four times a day (QID) | ORAL | Status: DC | PRN

## 2022-05-18 MED ORDER — INSULIN ASPART 100 UNIT/ML IJ SOLN
0.0000 [IU] | Freq: Every day | INTRAMUSCULAR | Status: DC
  Administered 2022-05-18: 4 [IU] via SUBCUTANEOUS
  Filled 2022-05-18: qty 1

## 2022-05-18 MED ORDER — ENOXAPARIN SODIUM 40 MG/0.4ML IJ SOSY: 40.0000 mg | PREFILLED_SYRINGE | INTRAMUSCULAR | Status: DC

## 2022-05-18 MED ORDER — NITROGLYCERIN 0.4 MG SL SUBL
0.4000 mg | SUBLINGUAL_TABLET | SUBLINGUAL | Status: DC | PRN
  Administered 2022-05-18: 0.4 mg via SUBLINGUAL
  Filled 2022-05-18: qty 1

## 2022-05-18 MED ORDER — HYDRALAZINE HCL 20 MG/ML IJ SOLN: 10.0000 mg | Freq: Four times a day (QID) | INTRAMUSCULAR | Status: DC | PRN

## 2022-05-18 MED ORDER — ACETAMINOPHEN 650 MG RE SUPP: 650.0000 mg | Freq: Four times a day (QID) | RECTAL | Status: DC | PRN

## 2022-05-18 MED ORDER — ROSUVASTATIN CALCIUM 20 MG PO TABS
40.0000 mg | ORAL_TABLET | Freq: Every day | ORAL | Status: DC
  Administered 2022-05-18: 40 mg via ORAL
  Filled 2022-05-18: qty 2

## 2022-05-18 MED ORDER — PANTOPRAZOLE SODIUM 20 MG PO TBEC
20.0000 mg | DELAYED_RELEASE_TABLET | Freq: Every day | ORAL | Status: DC
  Administered 2022-05-19: 20 mg via ORAL
  Filled 2022-05-18 (×2): qty 1

## 2022-05-18 MED ORDER — ISOSORBIDE MONONITRATE ER 30 MG PO TB24
30.0000 mg | ORAL_TABLET | Freq: Every day | ORAL | Status: DC
  Administered 2022-05-18 – 2022-05-19 (×2): 30 mg via ORAL
  Filled 2022-05-18 (×2): qty 1

## 2022-05-18 MED ORDER — GUAIFENESIN ER 600 MG PO TB12
600.0000 mg | ORAL_TABLET | Freq: Two times a day (BID) | ORAL | Status: DC
  Administered 2022-05-18 – 2022-05-19 (×4): 600 mg via ORAL
  Filled 2022-05-18 (×4): qty 1

## 2022-05-18 MED ORDER — LIDOCAINE HCL (PF) 1 % IJ SOLN: 5.0000 mL | INTRAMUSCULAR | Status: DC | PRN

## 2022-05-18 MED ORDER — SODIUM CHLORIDE 0.9 % IV SOLN
2.0000 g | INTRAVENOUS | Status: DC
  Administered 2022-05-18 – 2022-05-19 (×2): 2 g via INTRAVENOUS
  Filled 2022-05-18 (×2): qty 20

## 2022-05-18 MED ORDER — SODIUM CHLORIDE 0.9 % IV SOLN
500.0000 mg | INTRAVENOUS | Status: DC
  Administered 2022-05-18 – 2022-05-19 (×2): 500 mg via INTRAVENOUS
  Filled 2022-05-18 (×2): qty 5

## 2022-05-18 MED ORDER — ALBUTEROL SULFATE (2.5 MG/3ML) 0.083% IN NEBU: 2.5000 mg | INHALATION_SOLUTION | RESPIRATORY_TRACT | Status: DC | PRN

## 2022-05-18 MED ORDER — CHLORHEXIDINE GLUCONATE CLOTH 2 % EX PADS
6.0000 | MEDICATED_PAD | Freq: Every day | CUTANEOUS | Status: DC
  Administered 2022-05-18 – 2022-05-19 (×2): 6 via TOPICAL
  Filled 2022-05-18 (×2): qty 6

## 2022-05-18 MED ORDER — ALBUTEROL SULFATE HFA 108 (90 BASE) MCG/ACT IN AERS: 2.0000 | INHALATION_SPRAY | RESPIRATORY_TRACT | Status: DC | PRN

## 2022-05-18 MED ORDER — APIXABAN 5 MG PO TABS
5.0000 mg | ORAL_TABLET | Freq: Two times a day (BID) | ORAL | Status: DC
  Administered 2022-05-18 – 2022-05-19 (×3): 5 mg via ORAL
  Filled 2022-05-18 (×3): qty 1

## 2022-05-18 MED ORDER — HYDRALAZINE HCL 50 MG PO TABS
100.0000 mg | ORAL_TABLET | Freq: Two times a day (BID) | ORAL | Status: DC
  Administered 2022-05-18 – 2022-05-19 (×3): 100 mg via ORAL
  Filled 2022-05-18 (×3): qty 2

## 2022-05-18 MED ORDER — ANTICOAGULANT SODIUM CITRATE 4% (200MG/5ML) IV SOLN: 5.0000 mL | Status: DC | PRN

## 2022-05-18 MED ORDER — INSULIN DETEMIR 100 UNIT/ML ~~LOC~~ SOLN
25.0000 [IU] | Freq: Every day | SUBCUTANEOUS | Status: DC
  Administered 2022-05-18 – 2022-05-19 (×2): 25 [IU] via SUBCUTANEOUS
  Filled 2022-05-18 (×2): qty 0.25

## 2022-05-18 MED ORDER — HEPARIN SODIUM (PORCINE) 1000 UNIT/ML IJ SOLN
INTRAMUSCULAR | Status: AC
  Filled 2022-05-18: qty 10

## 2022-05-18 MED ORDER — FUROSEMIDE 10 MG/ML IJ SOLN
40.0000 mg | Freq: Two times a day (BID) | INTRAMUSCULAR | Status: DC
  Administered 2022-05-18 – 2022-05-19 (×3): 40 mg via INTRAVENOUS
  Filled 2022-05-18 (×4): qty 4

## 2022-05-18 MED ORDER — HEPARIN SODIUM (PORCINE) 1000 UNIT/ML DIALYSIS: 1000.0000 [IU] | INTRAMUSCULAR | Status: DC | PRN

## 2022-05-18 MED ORDER — ESCITALOPRAM OXALATE 10 MG PO TABS
10.0000 mg | ORAL_TABLET | Freq: Every day | ORAL | Status: DC
  Administered 2022-05-18: 10 mg via ORAL
  Filled 2022-05-18: qty 1

## 2022-05-18 MED ORDER — PERFLUTREN LIPID MICROSPHERE
1.0000 mL | INTRAVENOUS | Status: AC | PRN
  Administered 2022-05-18: 3 mL via INTRAVENOUS

## 2022-05-18 NOTE — Hospital Course (Addendum)
61 year old male with past medical history of diabetes mellitus type 2, systolic/diastolic heart failure with ejection fraction of 45 to 50%, end-stage renal disease on hemodialysis, CAD and DVT on Eliquis who presented to the emergency room on 11/29 with shortness of breath x 1 week.  In the emergency room, patient found to have blood pressure of 224, blood sugar of 399 and patient found to be in acute on chronic combined systolic/diastolic heart failure.  Patient admitted to the hospitalist service and started on IV antihypertensives, Lasix and nephrology consulted for dialysis.  Patient was wanted to Lasix and dialysis and by evening of 11/30, breathing more comfortably and blood pressure systolic in the 277T.

## 2022-05-18 NOTE — ED Notes (Signed)
Pt transported to dialysis at this time.

## 2022-05-18 NOTE — ED Notes (Signed)
Pt complaining of left sided chest pain, 6/10. Neomia Glass NP notified, stat EKG done. .4 SL nitro given

## 2022-05-18 NOTE — Progress Notes (Signed)
[facility]  ROUNDING NOTE   Subjective:   Jimmy Olson  is a 61 year old Hispanic male with past medical conditions including anemia, two-vessel CAD, diabetes, depression, DVT with Eliquis, and end-stage renal disease on hemodialysis.  Patient presents to the emergency department with complaints of shortness of breath.  He has been admitted for Hypertensive emergency [I16.1]  Patient is known to our practice and receives outpatient dialysis treatments at [facility] on a TTS schedule, supervised by Dr. Candiss Norse.  Last treatment received on Tuesday, no complications.  Patient states he began to experience shortness of breath last week.  Denies any sick contacts, no fever or chills.  States shortness of breath rapidly progressed overnight.  Does endorse productive cough, and associated chest pressure with cough.  Denies any recent missed dialysis treatments.  Bilateral ED arrival significant for glucose 399, BUN 51, creatinine 4.72 with GFR 13, BNP greater than 3500, troponin 200, and hemoglobin 10.7.  Respiratory panel negative for influenza or COVID-19.  Chest x-ray shows mild interstitial edema.  We have been consulted to continue dialysis during this admission.  Objective:  Vital signs in last 24 hours:  Temp:  [97.9 F (36.6 C)-98.2 F (36.8 C)] 97.9 F (36.6 C) (11/30 0846) Pulse Rate:  [69-86] 69 (11/30 1200) Resp:  [11-27] 18 (11/30 1200) BP: (120-224)/(74-111) 162/101 (11/30 1200) SpO2:  [93 %-100 %] 98 % (11/30 1200) Weight:  [71.1 kg-79.6 kg] 71.1 kg (11/30 0830)  Weight change:  Filed Weights   05/17/22 2019 05/18/22 0830  Weight: 79.6 kg 71.1 kg    Intake/Output: I/O last 3 completed shifts: In: -  Out: 500 [Urine:500]   Intake/Output this shift:  No intake/output data recorded.  Physical Exam: General: NAD  Head: Normocephalic, atraumatic. Moist oral mucosal membranes  Eyes: Anicteric  Lungs:  Clear to auscultation, normal effort, room  air  Heart: Regular rate and rhythm  Abdomen:  Soft, nontender  Extremities: No peripheral edema.  Neurologic: Nonfocal, moving all four extremities  Skin: No lesions  Access: Right chest PermCath    Basic Metabolic Panel: Recent Labs  Lab 05/17/22 2023 05/18/22 0850  NA 137 140  K 4.6 3.6  CL 104 107  CO2 24 25  GLUCOSE 399* 137*  BUN 51* 52*  CREATININE 4.72* 4.87*  CALCIUM 9.2 8.4*  PHOS  --  4.6    Liver Function Tests: Recent Labs  Lab 05/18/22 0850  ALBUMIN 3.1*   No results for input(s): "LIPASE", "AMYLASE" in the last 168 hours. No results for input(s): "AMMONIA" in the last 168 hours.  CBC: Recent Labs  Lab 05/17/22 2023 05/17/22 2144 05/18/22 0850  WBC 4.8 6.4 4.9  NEUTROABS  --  4.4 3.0  HGB 10.7* 10.9* 9.5*  HCT 31.2* 31.1* 27.6*  MCV 90.7 88.6 90.5  PLT 191 190 170    Cardiac Enzymes: No results for input(s): "CKTOTAL", "CKMB", "CKMBINDEX", "TROPONINI" in the last 168 hours.  BNP: Invalid input(s): "POCBNP"  CBG: Recent Labs  Lab 05/18/22 0110 05/18/22 0740  GLUCAP 330* 31    Microbiology: Results for orders placed or performed during the hospital encounter of 05/17/22  Resp Panel by RT-PCR (Flu A&B, Covid) Anterior Nasal Swab     Status: None   Collection Time: 05/17/22  8:23 PM   Specimen: Anterior Nasal Swab  Result Value Ref Range Status   SARS Coronavirus 2 by RT PCR NEGATIVE NEGATIVE Final    Comment: (NOTE) SARS-CoV-2 target nucleic acids are NOT DETECTED.  The SARS-CoV-2 RNA is generally detectable in upper respiratory specimens during the acute phase of infection. The lowest concentration of SARS-CoV-2 viral copies this assay can detect is 138 copies/mL. A negative result does not preclude SARS-Cov-2 infection and should not be used as the sole basis for treatment or other patient management decisions. A negative result may occur with  improper specimen collection/handling, submission of specimen other than  nasopharyngeal swab, presence of viral mutation(s) within the areas targeted by this assay, and inadequate number of viral copies(<138 copies/mL). A negative result must be combined with clinical observations, patient history, and epidemiological information. The expected result is Negative.  Fact Sheet for Patients:  EntrepreneurPulse.com.au  Fact Sheet for Healthcare Providers:  IncredibleEmployment.be  This test is no t yet approved or cleared by the [place] FDA and  has been authorized for detection and/or diagnosis of SARS-CoV-2 by FDA under an Emergency Use Authorization (EUA). This EUA will remain  in effect (meaning this test can be used) for the duration of the COVID-19 declaration under Section 564(b)(1) of the Act, 21 U.S.C.section 360bbb-3(b)(1), unless the authorization is terminated  or revoked sooner.       Influenza A by PCR NEGATIVE NEGATIVE Final   Influenza B by PCR NEGATIVE NEGATIVE Final    Comment: (NOTE) The Xpert Xpress SARS-CoV-2/FLU/RSV plus assay is intended as an aid in the diagnosis of influenza from Nasopharyngeal swab specimens and should not be used as a sole basis for treatment. Nasal washings and aspirates are unacceptable for Xpert Xpress SARS-CoV-2/FLU/RSV testing.  Fact Sheet for Patients: EntrepreneurPulse.com.au  Fact Sheet for Healthcare Providers: IncredibleEmployment.be  This test is not yet approved or cleared by the [place] FDA and has been authorized for detection and/or diagnosis of SARS-CoV-2 by FDA under an Emergency Use Authorization (EUA). This EUA will remain in effect (meaning this test can be used) for the duration of the COVID-19 declaration under Section 564(b)(1) of the Act, 21 U.S.C. section 360bbb-3(b)(1), unless the authorization is terminated or revoked.  Performed at [facility], [address] [place] [employer], [place]  [address]     Coagulation Studies: No results for input(s): "LABPROT", "INR" in the last 72 hours.  Urinalysis: No results for input(s): "COLORURINE", "LABSPEC", "PHURINE", "GLUCOSEU", "HGBUR", "BILIRUBINUR", "KETONESUR", "PROTEINUR", "UROBILINOGEN", "NITRITE", "LEUKOCYTESUR" in the last 72 hours.  Invalid input(s): "APPERANCEUR"    Imaging: DG Chest 1 View  Result Date: 05/17/2022 CLINICAL DATA:  Cough and shortness of breath EXAM: CHEST  1 VIEW COMPARISON:  Radiographs 04/04/2022 FINDINGS: Stable cardiomegaly. Aortic atherosclerotic calcification. Right IJ CVC tip in the right atrium. Mild bilateral interstitial opacities. No focal consolidation, pleural effusion, or pneumothorax. IMPRESSION: Cardiomegaly.  Mild interstitial edema. Electronically Signed   By: Placido Sou M.D.   On: 05/17/2022 20:37     Medications:    anticoagulant sodium citrate     azithromycin Stopped (05/18/22 0224)   cefTRIAXone (ROCEPHIN)  IV      apixaban  5 mg Oral BID   carvedilol  25 mg Oral BID   Chlorhexidine Gluconate Cloth  6 each Topical Q0600   escitalopram  10 mg Oral QHS   furosemide  40 mg Intravenous Q12H   guaiFENesin  600 mg Oral BID   heparin sodium (porcine)       hydrALAZINE  100 mg Oral BID   insulin aspart  0-15 Units Subcutaneous TID WC   insulin aspart  0-5 Units Subcutaneous QHS   insulin detemir  25 Units Subcutaneous QHS  isosorbide mononitrate  30 mg Oral Daily   losartan  100 mg Oral QHS   pantoprazole  20 mg Oral Daily   rosuvastatin  40 mg Oral QHS   acetaminophen **OR** acetaminophen, albuterol, alteplase, anticoagulant sodium citrate, heparin, heparin sodium (porcine), hydrALAZINE, lidocaine (PF), lidocaine-prilocaine, nitroGLYCERIN, ondansetron **OR** ondansetron (ZOFRAN) IV, pentafluoroprop-tetrafluoroeth  Assessment/ Plan:  Jimmy Olson is a 61 y.o.  male with past medical conditions including anemia, two-vessel CAD, diabetes, depression, DVT with Eliquis,  and end-stage renal disease on hemodialysis.  Patient presents to the emergency department with complaints of shortness of breath.  He has been admitted for Hypertensive emergency [I16.1]  [place]/TTS/right chest PermCath/69.0 kg  End-stage renal disease on hemodialysis.  Last treatment completed on Tuesday.  Patient scheduled to receive dialysis today, UF goal 1.5 to 2 L as tolerated.  Next treatment scheduled for Saturday.  2. Anemia of chronic kidney disease  Lab Results  Component Value Date   HGB 9.5 (L) 05/18/2022    Hemoglobin above desired target.  Patient receives [place] outpatient.  Will continue to monitor.  3. Secondary Hyperparathyroidism: with outpatient labs: PTH 331, phosphorus 4.8, calcium 8.8 on 05/02/22.   Lab Results  Component Value Date   CALCIUM 8.4 (L) 05/18/2022   CAION 1.23 09/29/2021   PHOS 4.6 05/18/2022    Will continue to monitor bone minerals during this admission.  Calcium and phosphorus within desired range.  4. Diabetes mellitus type II with chronic kidney disease/renal manifestations: insulin dependent. Home regimen includes NovoLog, Humulog, Levemir. Most recent hemoglobin A1c is 10.1 on 07/14/21.         LOS: [place] 11/30/202312:19 PM

## 2022-05-18 NOTE — ED Notes (Addendum)
Pt states pain is better and reports feeling better after 1 dose of nitro

## 2022-05-18 NOTE — Progress Notes (Addendum)
[facility] Progress Note  Patient: Jimmy Olson    ZOX:[id number]  DOA: 05/17/2022    Date of Service: the patient was seen and examined on 05/18/2022  Brief hospital course: 61 year old male with past medical history of diabetes mellitus type 2, systolic/diastolic heart failure with ejection fraction of 45 to 50%, end-stage renal disease on hemodialysis, CAD and DVT on Eliquis who presented to the emergency room on 11/29 with shortness of breath x 1 week.  In the emergency room, patient found to have blood pressure of 224, blood sugar of 399 and patient found to be in acute on chronic combined systolic/diastolic heart failure.  Patient admitted to the hospitalist service and started on IV antihypertensives, Lasix and nephrology consulted for dialysis.  Patient was wanted to Lasix and dialysis and by evening of 11/30, breathing more comfortably and blood pressure systolic in the 811B.   Assessment and Plan: Acute on chronic systolic/diastolic heart failure: Repeat echocardiogram done, results pending.  Continue IV Lasix.  Significant amount of volume managed by dialysis.  Hypertensive urgency: Blood pressure is much more manageable, following dialysis and Lasix and antihypertensives.  Continue home medications.  End-stage renal disease on hemodialysis: Appreciate nephrology help.  Elevated troponin: No evidence of ACS, secondary to elevated volumes.  Bronchitis: Mildly elevated procalcitonin and patient started on Rocephin and Zithromax.  Will recheck labs in the morning and if procalcitonin level is normalized, can discontinue antibiotics at that point had to have received 2 doses  History of DVT: Continue Eliquis   Body mass index is 23.77 kg/m.        Consultants: Nephrology  Procedures: Hemodialysis Echocardiogram with results pending  Antimicrobials: IV Rocephin and Zithromax 11/29-present  Code Status: Full code   Subjective: Patient feels better following  dialysis  Objective: Vital signs were reviewed and unremarkable. Vitals:   05/18/22 1600 05/18/22 1700  BP: 122/77 110/72  Pulse: 73 77  Resp: 18 18  Temp:  97.8 F (36.6 C)  SpO2: 97% 100%    Intake/Output Summary (Last 24 hours) at 05/18/2022 1747 Last data filed at 05/18/2022 1226 Gross per 24 hour  Intake --  Output 2500 ml  Net -2500 ml   Filed Weights   05/17/22 2019 05/18/22 0830 05/18/22 1232  Weight: 79.6 kg 71.1 kg 66.8 kg   Body mass index is 23.77 kg/m.  Exam:  General: Alert and oriented x 3, no acute distress HEENT: Normocephalic, atraumatic, mucous membranes are slightly dry Cardiovascular: Regular rate and rhythm, S1-S2, 2 out of 6 systolic ejection murmur Respiratory: Decreased breath sounds bibasilar Abdomen: Soft, nontender, nondistended, positive bowel sounds Musculoskeletal: No clubbing or cyanosis, trace pitting edema Skin: No skin breaks, tears or lesions Psychiatry: Appropriate, no evidence of psychoses Neurology: No focal deficits  Data Reviewed: Hemoglobin of 9.5, labs consistent with chronic renal disease.  Disposition:  Status is: Inpatient Remains inpatient appropriate because:  -Follow-up on echocardiogram -Decide on further diuresis    Anticipated discharge date: 12/1  Family Communication: Will call patient's wife DVT Prophylaxis:  apixaban (ELIQUIS) tablet 5 mg    Author: Annita Brod ,MD 05/18/2022 5:47 PM  To reach On-call, see care teams to locate the attending and reach out via [email or web address]. Between 7PM-7AM, please contact night-coverage If you still have difficulty reaching the attending provider, please page the [facility] (Director on Call) for [facility] on amion for assistance.

## 2022-05-18 NOTE — Progress Notes (Signed)
       CROSS COVER NOTE  NAME: JAHMAR MCKELVY MRN: [id number] DOB : [birth year] ATTENDING PHYSICIAN: Athena Masse, MD    Date of Service   05/18/2022   HPI/Events of Note   3/10 (L) chest pain described as dull/aching that began this morning.Chest pain was responsive to 1 SL nitro and Mr Hurlbut rates pain 1/10 after nitro. The pain is not reproducible on exam. He endorses dyspnea and fatigue that is not changed from ED presentation.   Cath in Jan 2022 showed 2v disease and he has been medically managed.  Interpreter services utilized during interview and assessment of Mr Imperato.   Interventions   Assessment/Plan:  EKG Troponin 197-->211      This document was prepared using Dragon voice recognition software and may include unintentional dictation errors.  Neomia Glass DNP, MBA, FNP-BC Nurse Practitioner [facility] Pager [phone]

## 2022-05-18 NOTE — Progress Notes (Signed)
*  PRELIMINARY RESULTS* Echocardiogram 2D Echocardiogram has been performed.  Jimmy Olson Jimmy Olson Jimmy Olson 05/18/2022, 3:08 PM

## 2022-05-18 NOTE — ED Notes (Signed)
[place] NP aware of pt trop of 211

## 2022-05-18 NOTE — Progress Notes (Signed)
   05/18/22 1226  Vitals  Temp 97.9 F (36.6 C)  Temp Source Oral  BP (!) 161/101  MAP (mmHg) 117  BP Location Right Arm  BP Method Automatic  Patient Position (if appropriate) Lying  Pulse Rate 68  Pulse Rate Source Monitor  ECG Heart Rate 68  Resp 18  Oxygen Therapy  SpO2 99 %  O2 Device Room Air  Patient Activity (if Appropriate) In bed  Pulse Oximetry Type Continuous  During Treatment Monitoring  Blood Flow Rate (mL/min) 200 mL/min  HD Safety Checks Performed Yes  Intra-Hemodialysis Comments Tx completed  Post Treatment  Dialyzer Clearance Lightly streaked  Duration of HD Treatment -hour(s) 3.5 hour(s)  Hemodialysis Intake (mL) 0 mL  Liters Processed 84  Fluid Removed (mL) 2000 mL  Tolerated HD Treatment Yes  Post-Hemodialysis Comments hd tx completed. no complications.  Hemodialysis Catheter Right Internal jugular Double lumen Permanent (Tunneled)  Placement Date/Time: 06/02/21 1429   Time Out: Correct patient;Correct site;Correct procedure  Maximum sterile barrier precautions: Hand hygiene;Cap;Mask;Sterile gown;Sterile gloves;Large sterile sheet  Site Prep: Chlorhexidine (preferred)  Local Anes...  Site Condition No complications  Blue Lumen Status Heparin locked  Red Lumen Status Heparin locked  Purple Lumen Status N/A  Catheter fill solution Heparin 1000 units/ml  Catheter fill volume (Arterial) 1.9 cc  Catheter fill volume (Venous) 1.9  Dressing Type Transparent  Dressing Status Antimicrobial disc in place  Interventions Dressing changed;New dressing  Drainage Description None  Dressing Change Due 05/25/22  Post treatment catheter status Capped and Clamped

## 2022-05-18 NOTE — ED Notes (Signed)
Per Cinda Quest MD, pt okay to take home dose of hydralazine and losartan

## 2022-05-19 ENCOUNTER — Other Ambulatory Visit: Payer: Self-pay

## 2022-05-19 DIAGNOSIS — I161 Hypertensive emergency: Secondary | ICD-10-CM | POA: Diagnosis not present

## 2022-05-19 LAB — BASIC METABOLIC PANEL
Anion gap: 9 (ref 5–15)
BUN: 29 mg/dL — ABNORMAL HIGH (ref 6–20)
CO2: 27 mmol/L (ref 22–32)
Calcium: 8.6 mg/dL — ABNORMAL LOW (ref 8.9–10.3)
Chloride: 99 mmol/L (ref 98–111)
Creatinine, Ser: 3.75 mg/dL — ABNORMAL HIGH (ref 0.61–1.24)
GFR, Estimated: 18 mL/min — ABNORMAL LOW (ref >60)
Glucose, Bld: 111 mg/dL — ABNORMAL HIGH (ref 70–99)
Potassium: 3.5 mmol/L (ref 3.5–5.1)
Sodium: 135 mmol/L (ref 135–145)

## 2022-05-19 LAB — GLUCOSE, CAPILLARY
Glucose-Capillary: 324 mg/dL — ABNORMAL HIGH (ref 70–99)
Glucose-Capillary: 58 mg/dL — ABNORMAL LOW (ref 70–99)
Glucose-Capillary: 249 mg/dL — ABNORMAL HIGH (ref 70–99)
Glucose-Capillary: 261 mg/dL — ABNORMAL HIGH (ref 70–99)

## 2022-05-19 LAB — CBC
HCT: 31.2 % — ABNORMAL LOW (ref 39.0–52.0)
Hemoglobin: 10.6 g/dL — ABNORMAL LOW (ref 13.0–17.0)
MCH: 30.5 pg (ref 26.0–34.0)
MCHC: 34 g/dL (ref 30.0–36.0)
MCV: 89.9 fL (ref 80.0–100.0)
Platelets: 167 10*3/uL (ref 150–400)
RBC: 3.47 MIL/uL — ABNORMAL LOW (ref 4.22–5.81)
RDW: 12.7 % (ref 11.5–15.5)
WBC: 4.6 10*3/uL (ref 4.0–10.5)
nRBC: 0 % (ref 0.0–0.2)

## 2022-05-19 LAB — TROPONIN I (HIGH SENSITIVITY)
Troponin I (High Sensitivity): 194 ng/L (ref <18)
Troponin I (High Sensitivity): 202 ng/L (ref <18)
Troponin I (High Sensitivity): 197 ng/L (ref <18)

## 2022-05-19 LAB — HEPATITIS B SURFACE ANTIBODY, QUANTITATIVE: Hep B S AB Quant (Post): <3.1 m[IU]/mL — ABNORMAL LOW (ref >9.9)

## 2022-05-19 LAB — PROCALCITONIN: Procalcitonin: 0.24 ng/mL

## 2022-05-19 MED ORDER — TORSEMIDE 20 MG PO TABS
20.0000 mg | ORAL_TABLET | Freq: Every day | ORAL | 0 refills | Status: DC
  Filled 2022-05-19: qty 24, 84d supply, fill #0

## 2022-05-19 MED ORDER — APIXABAN 5 MG PO TABS
5.0000 mg | ORAL_TABLET | Freq: Two times a day (BID) | ORAL | 1 refills | Status: DC
  Filled 2022-05-19: qty 180, 90d supply, fill #0

## 2022-05-19 MED ORDER — HYDRALAZINE HCL 100 MG PO TABS
100.0000 mg | ORAL_TABLET | Freq: Two times a day (BID) | ORAL | 0 refills | Status: DC
  Filled 2022-05-19: qty 60, 30d supply, fill #0

## 2022-05-19 MED ORDER — ROSUVASTATIN CALCIUM 40 MG PO TABS
40.0000 mg | ORAL_TABLET | Freq: Every day | ORAL | 0 refills | Status: DC
  Filled 2022-05-19: qty 30, 30d supply, fill #0

## 2022-05-19 MED ORDER — ORAL CARE MOUTH RINSE: 15.0000 mL | OROMUCOSAL | Status: DC | PRN

## 2022-05-19 MED ORDER — NITROGLYCERIN 0.4 MG SL SUBL
0.4000 mg | SUBLINGUAL_TABLET | SUBLINGUAL | 0 refills | Status: DC | PRN
  Filled 2022-05-19: qty 25, 8d supply, fill #0

## 2022-05-19 MED ORDER — PANTOPRAZOLE SODIUM 20 MG PO TBEC
20.0000 mg | DELAYED_RELEASE_TABLET | Freq: Every day | ORAL | 0 refills | Status: DC
  Filled 2022-05-19: qty 30, 30d supply, fill #0

## 2022-05-19 MED ORDER — INSULIN DETEMIR 100 UNIT/ML ~~LOC~~ SOLN: 18.0000 [IU] | Freq: Every day | SUBCUTANEOUS | Status: DC

## 2022-05-19 MED ORDER — ESCITALOPRAM OXALATE 10 MG PO TABS
10.0000 mg | ORAL_TABLET | Freq: Every day | ORAL | 0 refills | Status: AC
  Filled 2022-05-19: qty 30, 30d supply, fill #0

## 2022-05-19 MED ORDER — LOSARTAN POTASSIUM 100 MG PO TABS
100.0000 mg | ORAL_TABLET | Freq: Every day | ORAL | 0 refills | Status: DC
  Filled 2022-05-19: qty 30, 30d supply, fill #0

## 2022-05-19 MED ORDER — DEXTROSE 50 % IV SOLN
INTRAVENOUS | Status: AC
  Filled 2022-05-19: qty 50

## 2022-05-19 MED ORDER — CEFDINIR 300 MG PO CAPS
300.0000 mg | ORAL_CAPSULE | Freq: Two times a day (BID) | ORAL | 0 refills | Status: AC
  Filled 2022-05-19: qty 6, 3d supply, fill #0

## 2022-05-19 MED ORDER — DEXTROSE 50 % IV SOLN
12.5000 g | INTRAVENOUS | Status: AC
  Administered 2022-05-19: 12.5 g via INTRAVENOUS

## 2022-05-19 NOTE — ED Notes (Signed)
Request made for transport to the floor ?

## 2022-05-19 NOTE — Discharge Summary (Signed)
Physician Discharge Summary  Jimmy Olson ZDG:[id number] DOB: 07/10/1960 DOA: 05/17/2022  PCP[facility] [place] date: 05/17/2022 Discharge date: 05/19/2022  Admitted From: Home Disposition:  Home  Recommendations for Outpatient Follow-up:  Follow up with PCP in 1-2 weeks Resume normal outpatient hemodialysis schedule  Home Health: No Equipment/Devices: None  Discharge Condition: Stable CODE STATUS: Full Diet recommendation: Renal with fluid restriction  Brief/Interim Summary:  61 year old male with past medical history of diabetes mellitus type 2, systolic/diastolic heart failure with ejection fraction of 45 to 50%, end-stage renal disease on hemodialysis, CAD and DVT on Eliquis who presented to the emergency room on 11/29 with shortness of breath x 1 week.  In the emergency room, patient found to have blood pressure of 224, blood sugar of 399 and patient found to be in acute on chronic combined systolic/diastolic heart failure.  Patient admitted to the hospitalist service and started on IV antihypertensives, Lasix and nephrology consulted for dialysis.   Patient was weaned to Lasix and dialysis and by evening of 11/30, breathing more comfortably and blood pressure systolic in the 951O.  12/1: On the day of discharge patient feels better.  Blood pressure control improved.  Discussed with nephrology.  Cleared for discharge at this time.  Hypertensive regimen sent to [facility].   Discharge Diagnoses:  Principal Problem:   Hypertensive emergency Active Problems:   Acute on chronic HFrEF (heart failure with reduced ejection fraction) (HCC)   Elevated troponin   Respiratory tract infection   Coronary artery disease involving native coronary artery of native heart without angina pectoris   ESRD on hemodialysis ([place])   Uncontrolled type 2 diabetes mellitus with hyperglycemia, with long-term current use of insulin (HCC)   Current long-term use of  anticoagulant medication with history of DVT left axillary vein 05/2021   Anemia of chronic kidney failure, stage 5 (HCC)   Depression   Hypertensive urgency  Acute on chronic systolic and diastolic heart failure: Repeat echocardiogram done, EF 40 to 45% with grade 2 diastolic dysfunction.  Consistent with prior readings.  IV Lasix discontinued.  Will discharge on p.o. torsemide 20 mg daily scheduled.    Hypertensive urgency: Blood pressure is much more manageable, following dialysis and Lasix and antihypertensives.  Discharged on extensive home medication regimen.  Medication burden extensive for this patient unfortunately.   End-stage renal disease on hemodialysis: Appreciate nephrology help.   Elevated troponin: No evidence of ACS, secondary to elevated volumes.   Bronchitis: Mildly elevated procalcitonin and patient started on Rocephin and Zithromax.  Unable to totally rule out infection at this time.  Will transition to p.o. cefdinir to complete additional 3 days for 5-day empiric antibiotic course.  Discharge Instructions  Discharge Instructions     Diet - low sodium heart healthy   Complete by: As directed    Increase activity slowly   Complete by: As directed    No wound care   Complete by: As directed       Allergies as of 05/19/2022   No Known Allergies      Medication List     STOP taking these medications    ezetimibe 10 MG tablet Commonly known as: ZETIA   HumaLOG KwikPen 100 UNIT/ML KwikPen Generic drug: insulin lispro   HYDROcodone-acetaminophen 5-325 MG tablet Commonly known as: NORCO/VICODIN   multivitamin Tabs tablet   Opcon-A 0.027-0.315 % Soln Generic drug: Naphazoline-Pheniramine       TAKE these medications    acetaminophen 325  MG tablet Commonly known as: TYLENOL Take 650 mg by mouth every 6 (six) hours as needed.   albuterol 108 (90 Base) MCG/ACT inhaler Commonly known as: Proventil HFA INHALE 2 PULSACIONES EN LOS PULMONES UNA VEZ  CADA [place]. (INHALE 2 PUFFS INTO THE LUNGS ONCE EVERY 6 HOURS AS NEEDED FOR WHEEZING OR SHORTNESS OF BREATH.)   apixaban 5 MG Tabs tablet Commonly known as: Eliquis TOME 1 TABLETA POR BOCA DOS VECES AL DA. (TAKE 1 TABLET BY MOUTH TWICE DAILY.) What changed: when to take this   carvedilol 25 MG tablet Commonly known as: COREG Tome 1 tableta (25 mg en total) por va oral 2 (dos) veces al [employer]. (Take 1 tablet (25 mg total) by mouth 2 (two) times daily.)   cefdinir 300 MG capsule Commonly known as: OMNICEF Take 1 capsule (300 mg total) by mouth 2 (two) times daily for 3 days.   Comfort EZ Pen Needles 32G X 4 MM Misc Generic drug: Insulin Pen Needle Use como se indica. (USE AS DIRECTED)   escitalopram 10 MG tablet Commonly known as: LEXAPRO Tome 1 tableta (10 mg en total) por va oral antes de acostarse. (Take 1 tablet (10 mg total) by mouth at bedtime.) What changed:  medication strength how much to take Another medication with the same name was removed. Continue taking this medication, and follow the directions you see here.   feeding supplement (NEPRO CARB STEADY) Liqd Take 237 mLs by mouth 3 (three) times daily between meals.   hydrALAZINE 100 MG tablet Commonly known as: APRESOLINE Take 1 tablet (100 mg total) by mouth 2 (two) times daily.   isosorbide mononitrate 30 MG 24 hr tablet Commonly known as: IMDUR Tome 1 tableta (30 mg en total) por va oral por la maana y al acostarse. (Take 1 tablet (30 mg total) by mouth in the morning and at bedtime.)   Levemir FlexPen 100 UNIT/ML FlexPen Generic drug: insulin detemir [employer] 20 Unidades en la piel una vez al da a la hora de [place]. (Inject 20 Units into the skin once daily at bedtime.) What changed: how much to take   losartan 100 MG tablet Commonly known as: COZAAR Tome 1 tableta (100 mg en total) por va oral diariamente. (Take 1 tablet (100 mg total) by  mouth daily.) What changed: Another medication with the same name was removed. Continue taking this medication, and follow the directions you see here.   nitroGLYCERIN 0.4 MG SL tablet Commonly known as: NITROSTAT Place 1 tablet (0.4 mg total) under the tongue every 5 (five) minutes as needed for chest pain.   NovoLOG FlexPen 100 UNIT/ML FlexPen Generic drug: insulin aspart Inject 3 Units into the skin 3 (three) times daily with meals. Sliding scale.   pantoprazole 20 MG tablet Commonly known as: Protonix Tome 1 tableta (20 mg en total) por va oral diariamente. (Take 1 tablet (20 mg total) by mouth daily.)   rosuvastatin 40 MG tablet Commonly known as: CRESTOR Tome 1 tableta (40 mg en total) por va oral diariamente. (Take 1 tablet (40 mg total) by mouth daily.) What changed: Another medication with the same name was removed. Continue taking this medication, and follow the directions you see here.   torsemide 20 MG tablet Commonly known as: DEMADEX Take 1 tablet (20 mg total) by mouth daily. Takes on Sat and Sun What changed:  when to take this reasons to take this  No Known Allergies  Consultations: Nephrology   Procedures/Studies: ECHOCARDIOGRAM COMPLETE  Result Date: 05/18/2022    ECHOCARDIOGRAM REPORT   Patient Name:   TATSUO MUSIAL Date of Exam: 05/18/2022 Medical Rec #:  [id number]     Height:       66.0 in Accession #:    [id number]    Weight:       147.3 lb Date of Birth:  [birth year]     BSA:          1.756 m Patient Age:    82 years      BP:           181/99 mmHg Patient Gender: M             HR:           75 bpm. Exam Location:  [facility] Procedure: 2D Echo, Color Doppler, Cardiac Doppler and Intracardiac            Opacification Agent Indications:     I50.31 congestive heart failure-Acute Diastolic  History:         Patient has prior history of Echocardiogram examinations, most                  recent 08/08/2021. HFrEF, NICM, CAD, ESRD and TIA; Risk                   Factors:Hypertension, Diabetes and Dyslipidemia.  Sonographer:     Charmayne Sheer Referring Phys:  [id number] Athena Masse Diagnosing Phys: Ida Rogue MD  Sonographer Comments: Suboptimal apical window. IMPRESSIONS  1. Left ventricular ejection fraction, by estimation, is 40 to 45%. The left ventricle has mildly decreased function. The left ventricle demonstrates global hypokinesis. There is mild left ventricular hypertrophy. Small region of noncompaction noted in the apical region, less likely mural thrombus (noted on prior studies). Left ventricular diastolic parameters are consistent with Grade II diastolic dysfunction (pseudonormalization).  2. Right ventricular systolic function is normal. The right ventricular size is normal. Tricuspid regurgitation signal is inadequate for assessing PA pressure.  3. The mitral valve is normal in structure. No evidence of mitral valve regurgitation. No evidence of mitral stenosis.  4. The aortic valve was not well visualized. Aortic valve regurgitation is not visualized. No aortic stenosis is present.  5. The inferior vena cava is normal in size with greater than 50% respiratory variability, suggesting right atrial pressure of 3 mmHg. FINDINGS  Left Ventricle: Left ventricular ejection fraction, by estimation, is 40 to 45%. The left ventricle has mildly decreased function. The left ventricle demonstrates global hypokinesis. Definity contrast agent was given IV to delineate the left ventricular  endocardial borders. The left ventricular internal cavity size was normal in size. There is mild left ventricular hypertrophy. Left ventricular diastolic parameters are consistent with Grade II diastolic dysfunction (pseudonormalization). Right Ventricle: The right ventricular size is normal. No increase in right ventricular wall thickness. Right ventricular systolic function is normal. Tricuspid regurgitation signal is inadequate for assessing PA pressure. Left Atrium: Left atrial  size was normal in size. Right Atrium: Right atrial size was normal in size. Pericardium: There is no evidence of pericardial effusion. Mitral Valve: The mitral valve is normal in structure. No evidence of mitral valve regurgitation. No evidence of mitral valve stenosis. Tricuspid Valve: The tricuspid valve is normal in structure. Tricuspid valve regurgitation is not demonstrated. No evidence of tricuspid stenosis. Aortic Valve: The aortic valve was not well visualized. Aortic valve regurgitation is not visualized.  No aortic stenosis is present. Aortic valve mean gradient measures 2.0 mmHg. Aortic valve peak gradient measures 3.7 mmHg. Aortic valve area, by VTI measures 2.22 cm. Pulmonic Valve: The pulmonic valve was normal in structure. Pulmonic valve regurgitation is not visualized. No evidence of pulmonic stenosis. Aorta: The aortic root is normal in size and structure. Venous: The inferior vena cava is normal in size with greater than 50% respiratory variability, suggesting right atrial pressure of 3 mmHg. IAS/Shunts: No atrial level shunt detected by color flow Doppler.  LEFT VENTRICLE PLAX 2D LVIDd:         4.50 cm      Diastology LVIDs:         3.70 cm      LV e' medial:    5.55 cm/s LV PW:         1.40 cm      LV E/e' medial:  11.0 LV IVS:        1.40 cm      LV e' lateral:   6.31 cm/s LVOT diam:     1.90 cm      LV E/e' lateral: 9.6 LV SV:         33 LV SV Index:   19 LVOT Area:     2.84 cm  LV Volumes (MOD) LV vol d, MOD A2C: 101.0 ml LV vol d, MOD A4C: 117.0 ml LV vol s, MOD A2C: 51.5 ml LV vol s, MOD A4C: 66.0 ml LV SV MOD A2C:     49.5 ml LV SV MOD A4C:     117.0 ml LV SV MOD BP:      52.3 ml RIGHT VENTRICLE RV Basal diam:  2.70 cm TAPSE (M-mode): 1.8 cm LEFT ATRIUM             Index        RIGHT ATRIUM          Index LA diam:        3.90 cm 2.22 cm/m   RA Area:     7.95 cm LA Vol (A2C):   31.4 ml 17.88 ml/m  RA Volume:   13.20 ml 7.52 ml/m LA Vol (A4C):   25.0 ml 14.24 ml/m LA Biplane Vol: 28.0  ml 15.95 ml/m  AORTIC VALVE                    PULMONIC VALVE AV Area (Vmax):    1.97 cm     PV Vmax:       0.80 m/s AV Area (Vmean):   2.29 cm     PV Vmean:      55.100 cm/s AV Area (VTI):     2.22 cm     PV VTI:        0.138 m AV Vmax:           96.80 cm/s   PV Peak grad:  2.6 mmHg AV Vmean:          62.500 cm/s  PV Mean grad:  1.0 mmHg AV VTI:            0.147 m AV Peak Grad:      3.7 mmHg AV Mean Grad:      2.0 mmHg LVOT Vmax:         67.10 cm/s LVOT Vmean:        50.400 cm/s LVOT VTI:          0.115 m LVOT/AV VTI ratio: 0.78  AORTA Ao Root  diam: 2.60 cm MITRAL VALVE MV Area (PHT): 3.72 cm    SHUNTS MV Decel Time: 204 msec    Systemic VTI:  0.12 m MV E velocity: 60.80 cm/s  Systemic Diam: 1.90 cm MV A velocity: 57.10 cm/s MV E/A ratio:  1.06 Ida Rogue MD Electronically signed by Ida Rogue MD Signature Date/Time: 05/18/2022/5:44:03 PM    Final    DG Chest 1 View  Result Date: 05/17/2022 CLINICAL DATA:  Cough and shortness of breath EXAM: CHEST  1 VIEW COMPARISON:  Radiographs 04/04/2022 FINDINGS: Stable cardiomegaly. Aortic atherosclerotic calcification. Right IJ CVC tip in the right atrium. Mild bilateral interstitial opacities. No focal consolidation, pleural effusion, or pneumothorax. IMPRESSION: Cardiomegaly.  Mild interstitial edema. Electronically Signed   By: Placido Sou M.D.   On: 05/17/2022 20:37      Subjective: Seen and examined on the day of discharge.  Stable no distress.  History conducted in [place].  Patient appropriate for discharge home.  Discharge Exam: Vitals:   05/19/22 0609 05/19/22 0737  BP: 123/70 118/69  Pulse: 71 76  Resp: 18 18  Temp:  97.6 F (36.4 C)  SpO2: 98% 98%   Vitals:   05/19/22 0330 05/19/22 0500 05/19/22 0609 05/19/22 0737  BP: 121/65  123/70 118/69  Pulse: 77  71 76  Resp: 16  18 18   Temp: 98.4 F (36.9 C)   97.6 F (36.4 C)  TempSrc: Oral     SpO2: 100%  98% 98%  Weight:  67 kg    Height:        General: Pt is alert,  awake, not in acute distress Cardiovascular: RRR, S1/S2 +, no rubs, no gallops Respiratory: CTA bilaterally, no wheezing, no rhonchi Abdominal: Soft, NT, ND, bowel sounds + Extremities: no edema, no cyanosis    The results of significant diagnostics from this hospitalization (including imaging, microbiology, ancillary and laboratory) are listed below for reference.     Microbiology: Recent Results (from the past 240 hour(s))  Resp Panel by RT-PCR (Flu A&B, Covid) Anterior Nasal Swab     Status: None   Collection Time: 05/17/22  8:23 PM   Specimen: Anterior Nasal Swab  Result Value Ref Range Status   SARS Coronavirus 2 by RT PCR NEGATIVE NEGATIVE Final    Comment: (NOTE) SARS-CoV-2 target nucleic acids are NOT DETECTED.  The SARS-CoV-2 RNA is generally detectable in upper respiratory specimens during the acute phase of infection. The lowest concentration of SARS-CoV-2 viral copies this assay can detect is 138 copies/mL. A negative result does not preclude SARS-Cov-2 infection and should not be used as the sole basis for treatment or other patient management decisions. A negative result may occur with  improper specimen collection/handling, submission of specimen other than nasopharyngeal swab, presence of viral mutation(s) within the areas targeted by this assay, and inadequate number of viral copies(<138 copies/mL). A negative result must be combined with clinical observations, patient history, and epidemiological information. The expected result is Negative.  Fact Sheet for Patients:  EntrepreneurPulse.com.au  Fact Sheet for Healthcare Providers:  IncredibleEmployment.be  This test is no t yet approved or cleared by the [place] FDA and  has been authorized for detection and/or diagnosis of SARS-CoV-2 by FDA under an Emergency Use Authorization (EUA). This EUA will remain  in effect (meaning this test can be used) for the duration of  the COVID-19 declaration under Section 564(b)(1) of the Act, 21 U.S.C.section 360bbb-3(b)(1), unless the authorization is terminated  or revoked sooner.  Influenza A by PCR NEGATIVE NEGATIVE Final   Influenza B by PCR NEGATIVE NEGATIVE Final    Comment: (NOTE) The Xpert Xpress SARS-CoV-2/FLU/RSV plus assay is intended as an aid in the diagnosis of influenza from Nasopharyngeal swab specimens and should not be used as a sole basis for treatment. Nasal washings and aspirates are unacceptable for Xpert Xpress SARS-CoV-2/FLU/RSV testing.  Fact Sheet for Patients: EntrepreneurPulse.com.au  Fact Sheet for Healthcare Providers: IncredibleEmployment.be  This test is not yet approved or cleared by the [place] FDA and has been authorized for detection and/or diagnosis of SARS-CoV-2 by FDA under an Emergency Use Authorization (EUA). This EUA will remain in effect (meaning this test can be used) for the duration of the COVID-19 declaration under Section 564(b)(1) of the Act, 21 U.S.C. section 360bbb-3(b)(1), unless the authorization is terminated or revoked.  Performed at [facility], [address] [place], [place] [address]      Labs: BNP (last 3 results) Recent Labs    08/08/21 0023 04/04/22 1239 05/17/22 2023  BNP 2,160.8* 971.8* 9,767.3*   Basic Metabolic Panel: Recent Labs  Lab 05/17/22 2023 05/18/22 0850 05/19/22 0218  NA 137 140 135  K 4.6 3.6 3.5  CL 104 107 99  CO2 24 25 27   GLUCOSE 399* 137* 111*  BUN 51* 52* 29*  CREATININE 4.72* 4.87* 3.75*  CALCIUM 9.2 8.4* 8.6*  PHOS  --  4.6  --    Liver Function Tests: Recent Labs  Lab 05/18/22 0850  ALBUMIN 3.1*   No results for input(s): "LIPASE", "AMYLASE" in the last 168 hours. No results for input(s): "AMMONIA" in the last 168 hours. CBC: Recent Labs  Lab 05/17/22 2023 05/17/22 2144 05/18/22 0850 05/19/22 0218  WBC 4.8 6.4 4.9 4.6  NEUTROABS   --  4.4 3.0  --   HGB 10.7* 10.9* 9.5* 10.6*  HCT 31.2* 31.1* 27.6* 31.2*  MCV 90.7 88.6 90.5 89.9  PLT 191 190 170 167   Cardiac Enzymes: No results for input(s): "CKTOTAL", "CKMB", "CKMBINDEX", "TROPONINI" in the last 168 hours. BNP: Invalid input(s): "POCBNP" CBG: Recent Labs  Lab 05/18/22 2213 05/19/22 0551 05/19/22 0603 05/19/22 0651 05/19/22 0739  GLUCAP 109* 58* 324* 249* 261*   D-Dimer No results for input(s): "DDIMER" in the last 72 hours. Hgb A1c Recent Labs    05/17/22 2023  HGBA1C 10.1*   Lipid Profile No results for input(s): "CHOL", "HDL", "LDLCALC", "TRIG", "CHOLHDL", "LDLDIRECT" in the last 72 hours. Thyroid function studies No results for input(s): "TSH", "T4TOTAL", "T3FREE", "THYROIDAB" in the last 72 hours.  Invalid input(s): "FREET3" Anemia work up No results for input(s): "VITAMINB12", "FOLATE", "FERRITIN", "TIBC", "IRON", "RETICCTPCT" in the last 72 hours. Urinalysis No results found for: "COLORURINE", "APPEARANCEUR", "LABSPEC", "PHURINE", "GLUCOSEU", "HGBUR", "BILIRUBINUR", "KETONESUR", "PROTEINUR", "UROBILINOGEN", "NITRITE", "LEUKOCYTESUR" Sepsis Labs Recent Labs  Lab 05/17/22 2023 05/17/22 2144 05/18/22 0850 05/19/22 0218  WBC 4.8 6.4 4.9 4.6   Microbiology Recent Results (from the past 240 hour(s))  Resp Panel by RT-PCR (Flu A&B, Covid) Anterior Nasal Swab     Status: None   Collection Time: 05/17/22  8:23 PM   Specimen: Anterior Nasal Swab  Result Value Ref Range Status   SARS Coronavirus 2 by RT PCR NEGATIVE NEGATIVE Final    Comment: (NOTE) SARS-CoV-2 target nucleic acids are NOT DETECTED.  The SARS-CoV-2 RNA is generally detectable in upper respiratory specimens during the acute phase of infection. The lowest concentration of SARS-CoV-2 viral copies this assay can detect is 138  copies/mL. A negative result does not preclude SARS-Cov-2 infection and should not be used as the sole basis for treatment or other patient  management decisions. A negative result may occur with  improper specimen collection/handling, submission of specimen other than nasopharyngeal swab, presence of viral mutation(s) within the areas targeted by this assay, and inadequate number of viral copies(<138 copies/mL). A negative result must be combined with clinical observations, patient history, and epidemiological information. The expected result is Negative.  Fact Sheet for Patients:  EntrepreneurPulse.com.au  Fact Sheet for Healthcare Providers:  IncredibleEmployment.be  This test is no t yet approved or cleared by the [place] FDA and  has been authorized for detection and/or diagnosis of SARS-CoV-2 by FDA under an Emergency Use Authorization (EUA). This EUA will remain  in effect (meaning this test can be used) for the duration of the COVID-19 declaration under Section 564(b)(1) of the Act, 21 U.S.C.section 360bbb-3(b)(1), unless the authorization is terminated  or revoked sooner.       Influenza A by PCR NEGATIVE NEGATIVE Final   Influenza B by PCR NEGATIVE NEGATIVE Final    Comment: (NOTE) The Xpert Xpress SARS-CoV-2/FLU/RSV plus assay is intended as an aid in the diagnosis of influenza from Nasopharyngeal swab specimens and should not be used as a sole basis for treatment. Nasal washings and aspirates are unacceptable for Xpert Xpress SARS-CoV-2/FLU/RSV testing.  Fact Sheet for Patients: EntrepreneurPulse.com.au  Fact Sheet for Healthcare Providers: IncredibleEmployment.be  This test is not yet approved or cleared by the [place] FDA and has been authorized for detection and/or diagnosis of SARS-CoV-2 by FDA under an Emergency Use Authorization (EUA). This EUA will remain in effect (meaning this test can be used) for the duration of the COVID-19 declaration under Section 564(b)(1) of the Act, 21 U.S.C. section 360bbb-3(b)(1),  unless the authorization is terminated or revoked.  Performed at [facility], [address] [place], [place] [address]      Time coordinating discharge: Over 30 minutes  SIGNED:   Sidney Ace, MD  [facility] 05/19/2022, 11:04 AM Pager   If 7PM-7AM, please contact night-coverage

## 2022-05-19 NOTE — Plan of Care (Signed)
  Problem: Education: Goal: Ability to demonstrate management of disease process will improve Outcome: Progressing   Problem: Education: Goal: Knowledge of General Education information will improve Description: Including pain rating scale, medication(s)/side effects and non-pharmacologic comfort measures Outcome: Progressing   

## 2022-05-19 NOTE — Progress Notes (Signed)
[facility]  ROUNDING NOTE   Subjective:   Jimmy Olson  is a 61 year old Hispanic male with past medical conditions including anemia, two-vessel CAD, diabetes, depression, DVT with Eliquis, and end-stage renal disease on hemodialysis.  Patient presents to the emergency department with complaints of shortness of breath.  He has been admitted for Hypertensive urgency [I16.0] Hypertensive emergency [I16.1] Severe hypertension [I10] Elevated procalcitonin [R79.89] Dyspnea, unspecified type [R06.00]  Patient is known to our practice and receives outpatient dialysis treatments at [facility] on a TTS schedule, supervised by Dr. Candiss Norse.    Patient seen and evaluated resting in bed Family at bedside Dialysis received yesterday, tolerated well Remains on room air  Objective:  Vital signs in last 24 hours:  Temp:  [97.6 F (36.4 C)-98.4 F (36.9 C)] 97.6 F (36.4 C) (12/01 0737) Pulse Rate:  [71-78] 76 (12/01 0737) Resp:  [16-22] 18 (12/01 0737) BP: (105-165)/(65-94) 118/69 (12/01 0737) SpO2:  [97 %-100 %] 98 % (12/01 0737) Weight:  [67 kg] 67 kg (12/01 0500)  Weight change: -8.5 kg Filed Weights   05/18/22 0830 05/18/22 1232 05/19/22 0500  Weight: 71.1 kg 66.8 kg 67 kg    Intake/Output: I/O last 3 completed shifts: In: 692.6 [P.O.:120; IV Piggyback:572.6] Out: 2500 [Urine:500; Other:2000]   Intake/Output this shift:  Total I/O In: 480 [P.O.:480] Out: -   Physical Exam: General: NAD  Head: Normocephalic, atraumatic. Moist oral mucosal membranes  Eyes: Anicteric  Lungs:  Clear to auscultation, normal effort, room air  Heart: Regular rate and rhythm  Abdomen:  Soft, nontender  Extremities: No peripheral edema.  Neurologic: Nonfocal, moving all four extremities  Skin: No lesions  Access: Right chest PermCath    Basic Metabolic Panel: Recent Labs  Lab 05/17/22 2023 05/18/22 0850 05/19/22 0218  NA 137 140 135  K 4.6 3.6 3.5  CL 104 107 99   CO2 24 25 27   GLUCOSE 399* 137* 111*  BUN 51* 52* 29*  CREATININE 4.72* 4.87* 3.75*  CALCIUM 9.2 8.4* 8.6*  PHOS  --  4.6  --      Liver Function Tests: Recent Labs  Lab 05/18/22 0850  ALBUMIN 3.1*    No results for input(s): "LIPASE", "AMYLASE" in the last 168 hours. No results for input(s): "AMMONIA" in the last 168 hours.  CBC: Recent Labs  Lab 05/17/22 2023 05/17/22 2144 05/18/22 0850 05/19/22 0218  WBC 4.8 6.4 4.9 4.6  NEUTROABS  --  4.4 3.0  --   HGB 10.7* 10.9* 9.5* 10.6*  HCT 31.2* 31.1* 27.6* 31.2*  MCV 90.7 88.6 90.5 89.9  PLT 191 190 170 167     Cardiac Enzymes: No results for input(s): "CKTOTAL", "CKMB", "CKMBINDEX", "TROPONINI" in the last 168 hours.  BNP: Invalid input(s): "POCBNP"  CBG: Recent Labs  Lab 05/18/22 2213 05/19/22 0551 05/19/22 0603 05/19/22 0651 05/19/22 0739  GLUCAP 109* 58* 324* 249* 261*     Microbiology: Results for orders placed or performed during the hospital encounter of 05/17/22  Resp Panel by RT-PCR (Flu A&B, Covid) Anterior Nasal Swab     Status: None   Collection Time: 05/17/22  8:23 PM   Specimen: Anterior Nasal Swab  Result Value Ref Range Status   SARS Coronavirus 2 by RT PCR NEGATIVE NEGATIVE Final    Comment: (NOTE) SARS-CoV-2 target nucleic acids are NOT DETECTED.  The SARS-CoV-2 RNA is generally detectable in upper respiratory specimens during the acute phase of infection. The lowest concentration of SARS-CoV-2 viral  copies this assay can detect is 138 copies/mL. A negative result does not preclude SARS-Cov-2 infection and should not be used as the sole basis for treatment or other patient management decisions. A negative result may occur with  improper specimen collection/handling, submission of specimen other than nasopharyngeal swab, presence of viral mutation(s) within the areas targeted by this assay, and inadequate number of viral copies(<138 copies/mL). A negative result must be combined  with clinical observations, patient history, and epidemiological information. The expected result is Negative.  Fact Sheet for Patients:  EntrepreneurPulse.com.au  Fact Sheet for Healthcare Providers:  IncredibleEmployment.be  This test is no t yet approved or cleared by the [place] FDA and  has been authorized for detection and/or diagnosis of SARS-CoV-2 by FDA under an Emergency Use Authorization (EUA). This EUA will remain  in effect (meaning this test can be used) for the duration of the COVID-19 declaration under Section 564(b)(1) of the Act, 21 U.S.C.section 360bbb-3(b)(1), unless the authorization is terminated  or revoked sooner.       Influenza A by PCR NEGATIVE NEGATIVE Final   Influenza B by PCR NEGATIVE NEGATIVE Final    Comment: (NOTE) The Xpert Xpress SARS-CoV-2/FLU/RSV plus assay is intended as an aid in the diagnosis of influenza from Nasopharyngeal swab specimens and should not be used as a sole basis for treatment. Nasal washings and aspirates are unacceptable for Xpert Xpress SARS-CoV-2/FLU/RSV testing.  Fact Sheet for Patients: EntrepreneurPulse.com.au  Fact Sheet for Healthcare Providers: IncredibleEmployment.be  This test is not yet approved or cleared by the [place] FDA and has been authorized for detection and/or diagnosis of SARS-CoV-2 by FDA under an Emergency Use Authorization (EUA). This EUA will remain in effect (meaning this test can be used) for the duration of the COVID-19 declaration under Section 564(b)(1) of the Act, 21 U.S.C. section 360bbb-3(b)(1), unless the authorization is terminated or revoked.  Performed at [facility], [address] [place], [place] [address]     Coagulation Studies: No results for input(s): "LABPROT", "INR" in the last 72 hours.  Urinalysis: No results for input(s): "COLORURINE", "LABSPEC", "PHURINE",  "GLUCOSEU", "HGBUR", "BILIRUBINUR", "KETONESUR", "PROTEINUR", "UROBILINOGEN", "NITRITE", "LEUKOCYTESUR" in the last 72 hours.  Invalid input(s): "APPERANCEUR"    Imaging: ECHOCARDIOGRAM COMPLETE  Result Date: 05/18/2022    ECHOCARDIOGRAM REPORT   Patient Name:   Jimmy Olson Date of Exam: 05/18/2022 Medical Rec #:  [id number]     Height:       66.0 in Accession #:    [id number]    Weight:       147.3 lb Date of Birth:  09-06-1960     BSA:          1.756 m Patient Age:    82 years      BP:           181/99 mmHg Patient Gender: M             HR:           75 bpm. Exam Location:  [facility] Procedure: 2D Echo, Color Doppler, Cardiac Doppler and Intracardiac            Opacification Agent Indications:     I50.31 congestive heart failure-Acute Diastolic  History:         Patient has prior history of Echocardiogram examinations, most                  recent 08/08/2021. HFrEF, NICM, CAD, ESRD and TIA; Risk  Factors:Hypertension, Diabetes and Dyslipidemia.  Sonographer:     Charmayne Sheer Referring Phys:  [id number] Athena Masse Diagnosing Phys: Ida Rogue MD  Sonographer Comments: Suboptimal apical window. IMPRESSIONS  1. Left ventricular ejection fraction, by estimation, is 40 to 45%. The left ventricle has mildly decreased function. The left ventricle demonstrates global hypokinesis. There is mild left ventricular hypertrophy. Small region of noncompaction noted in the apical region, less likely mural thrombus (noted on prior studies). Left ventricular diastolic parameters are consistent with Grade II diastolic dysfunction (pseudonormalization).  2. Right ventricular systolic function is normal. The right ventricular size is normal. Tricuspid regurgitation signal is inadequate for assessing PA pressure.  3. The mitral valve is normal in structure. No evidence of mitral valve regurgitation. No evidence of mitral stenosis.  4. The aortic valve was not well visualized. Aortic valve regurgitation is not  visualized. No aortic stenosis is present.  5. The inferior vena cava is normal in size with greater than 50% respiratory variability, suggesting right atrial pressure of 3 mmHg. FINDINGS  Left Ventricle: Left ventricular ejection fraction, by estimation, is 40 to 45%. The left ventricle has mildly decreased function. The left ventricle demonstrates global hypokinesis. Definity contrast agent was given IV to delineate the left ventricular  endocardial borders. The left ventricular internal cavity size was normal in size. There is mild left ventricular hypertrophy. Left ventricular diastolic parameters are consistent with Grade II diastolic dysfunction (pseudonormalization). Right Ventricle: The right ventricular size is normal. No increase in right ventricular wall thickness. Right ventricular systolic function is normal. Tricuspid regurgitation signal is inadequate for assessing PA pressure. Left Atrium: Left atrial size was normal in size. Right Atrium: Right atrial size was normal in size. Pericardium: There is no evidence of pericardial effusion. Mitral Valve: The mitral valve is normal in structure. No evidence of mitral valve regurgitation. No evidence of mitral valve stenosis. Tricuspid Valve: The tricuspid valve is normal in structure. Tricuspid valve regurgitation is not demonstrated. No evidence of tricuspid stenosis. Aortic Valve: The aortic valve was not well visualized. Aortic valve regurgitation is not visualized. No aortic stenosis is present. Aortic valve mean gradient measures 2.0 mmHg. Aortic valve peak gradient measures 3.7 mmHg. Aortic valve area, by VTI measures 2.22 cm. Pulmonic Valve: The pulmonic valve was normal in structure. Pulmonic valve regurgitation is not visualized. No evidence of pulmonic stenosis. Aorta: The aortic root is normal in size and structure. Venous: The inferior vena cava is normal in size with greater than 50% respiratory variability, suggesting right atrial pressure of  3 mmHg. IAS/Shunts: No atrial level shunt detected by color flow Doppler.  LEFT VENTRICLE PLAX 2D LVIDd:         4.50 cm      Diastology LVIDs:         3.70 cm      LV e' medial:    5.55 cm/s LV PW:         1.40 cm      LV E/e' medial:  11.0 LV IVS:        1.40 cm      LV e' lateral:   6.31 cm/s LVOT diam:     1.90 cm      LV E/e' lateral: 9.6 LV SV:         33 LV SV Index:   19 LVOT Area:     2.84 cm  LV Volumes (MOD) LV vol d, MOD A2C: 101.0 ml LV vol d, MOD A4C: 117.0 ml  LV vol s, MOD A2C: 51.5 ml LV vol s, MOD A4C: 66.0 ml LV SV MOD A2C:     49.5 ml LV SV MOD A4C:     117.0 ml LV SV MOD BP:      52.3 ml RIGHT VENTRICLE RV Basal diam:  2.70 cm TAPSE (M-mode): 1.8 cm LEFT ATRIUM             Index        RIGHT ATRIUM          Index LA diam:        3.90 cm 2.22 cm/m   RA Area:     7.95 cm LA Vol (A2C):   31.4 ml 17.88 ml/m  RA Volume:   13.20 ml 7.52 ml/m LA Vol (A4C):   25.0 ml 14.24 ml/m LA Biplane Vol: 28.0 ml 15.95 ml/m  AORTIC VALVE                    PULMONIC VALVE AV Area (Vmax):    1.97 cm     PV Vmax:       0.80 m/s AV Area (Vmean):   2.29 cm     PV Vmean:      55.100 cm/s AV Area (VTI):     2.22 cm     PV VTI:        0.138 m AV Vmax:           96.80 cm/s   PV Peak grad:  2.6 mmHg AV Vmean:          62.500 cm/s  PV Mean grad:  1.0 mmHg AV VTI:            0.147 m AV Peak Grad:      3.7 mmHg AV Mean Grad:      2.0 mmHg LVOT Vmax:         67.10 cm/s LVOT Vmean:        50.400 cm/s LVOT VTI:          0.115 m LVOT/AV VTI ratio: 0.78  AORTA Ao Root diam: 2.60 cm MITRAL VALVE MV Area (PHT): 3.72 cm    SHUNTS MV Decel Time: 204 msec    Systemic VTI:  0.12 m MV E velocity: 60.80 cm/s  Systemic Diam: 1.90 cm MV A velocity: 57.10 cm/s MV E/A ratio:  1.06 Ida Rogue MD Electronically signed by Ida Rogue MD Signature Date/Time: 05/18/2022/5:44:03 PM    Final    DG Chest 1 View  Result Date: 05/17/2022 CLINICAL DATA:  Cough and shortness of breath EXAM: CHEST  1 VIEW COMPARISON:  Radiographs  04/04/2022 FINDINGS: Stable cardiomegaly. Aortic atherosclerotic calcification. Right IJ CVC tip in the right atrium. Mild bilateral interstitial opacities. No focal consolidation, pleural effusion, or pneumothorax. IMPRESSION: Cardiomegaly.  Mild interstitial edema. Electronically Signed   By: Placido Sou M.D.   On: 05/17/2022 20:37     Medications:    anticoagulant sodium citrate     azithromycin Stopped (05/19/22 0125)   cefTRIAXone (ROCEPHIN)  IV 2 g (05/19/22 0856)    apixaban  5 mg Oral BID   carvedilol  25 mg Oral BID   Chlorhexidine Gluconate Cloth  6 each Topical Q0600   dextrose       escitalopram  10 mg Oral QHS   furosemide  40 mg Intravenous Q12H   guaiFENesin  600 mg Oral BID   hydrALAZINE  100 mg Oral BID   insulin aspart  0-15 Units Subcutaneous  TID WC   insulin aspart  0-5 Units Subcutaneous QHS   insulin detemir  18 Units Subcutaneous QHS   isosorbide mononitrate  30 mg Oral Daily   losartan  100 mg Oral QHS   pantoprazole  20 mg Oral Daily   rosuvastatin  40 mg Oral QHS   acetaminophen **OR** acetaminophen, albuterol, alteplase, anticoagulant sodium citrate, dextrose, heparin, hydrALAZINE, lidocaine (PF), lidocaine-prilocaine, nitroGLYCERIN, ondansetron **OR** ondansetron (ZOFRAN) IV, mouth rinse, pentafluoroprop-tetrafluoroeth  Assessment/ Plan:  Mr. Jimmy Olson is a 61 y.o.  male with past medical conditions including anemia, two-vessel CAD, diabetes, depression, DVT with Eliquis, and end-stage renal disease on hemodialysis.  Patient presents to the emergency department with complaints of shortness of breath.  He has been admitted for Hypertensive urgency [I16.0] Hypertensive emergency [I16.1] Severe hypertension [I10] Elevated procalcitonin [R79.89] Dyspnea, unspecified type [R06.00]  [facility]/TTS/right chest PermCath/69.0 kg  End-stage renal disease on hemodialysis.  Last treatment completed on Tuesday.  Dialysis received yesterday,  UF 2L achieved. Next treatment scheduled for Saturday.   2. Anemia of chronic kidney disease  Lab Results  Component Value Date   HGB 10.6 (L) 05/19/2022    Patient receives [facility] outpatient. Hgb above desired range.   3. Secondary Hyperparathyroidism: with outpatient labs: PTH 331, phosphorus 4.8, calcium 8.8 on 05/02/22.   Lab Results  Component Value Date   CALCIUM 8.6 (L) 05/19/2022   CAION 1.23 09/29/2021   PHOS 4.6 05/18/2022    Bone minerals acceptable. Will continue to monitor.   4. Diabetes mellitus type II with chronic kidney disease/renal manifestations: insulin dependent. Home regimen includes NovoLog, Humulog, Levemir. Most recent hemoglobin A1c is 10.1 on 07/14/21.   Hypoglycemic this morning to 58. Now glucose elevated.     LOS: 2   12/1/20231:45 PM

## 2022-05-19 NOTE — Progress Notes (Signed)
Spanish interpreter Waldon Merl [phone] facilitated AVS review and d/c discussion with pt and his wife. All questions answered. Map provided to community pharmacy for pickup of d/c rxs

## 2022-05-19 NOTE — Inpatient Diabetes Management (Addendum)
Inpatient Diabetes Program Recommendations  AACE/ADA: New Consensus Statement on Inpatient Glycemic Control (2015)  Target Ranges:  Prepandial:   less than 140 mg/dL      Peak postprandial:   less than 180 mg/dL (1-2 hours)      Critically ill patients:  140 - 180 mg/dL    Latest Reference Range & Units 05/18/22 01:10 05/18/22 07:40 05/18/22 14:32 05/18/22 16:23 05/18/22 22:13  Glucose-Capillary 70 - 99 mg/dL 330 (H)  4 units Novolog  25 units Levemir @0230  84 231 (H) 197 (H)  3 units Novolog  109 (H)  25 units Levemir @0008   (H): Data is abnormally high  Latest Reference Range & Units 05/19/22 05:51 05/19/22 06:03 05/19/22 06:51  Glucose-Capillary 70 - 99 mg/dL 58 (L) 324 (H)  8 oz juice + 25 ml D50% @0628  249 (H)  (L): Data is abnormally low (H): Data is abnormally high   Admit with:  Acute on chronic systolic/diastolic heart failure Hypertensive urgency  History: DM, ESRD, CHF  Home DM Meds: Levemir 30 units QHS      Novolog 3 units TID   Current Orders: Levemir 25 units QHS     Novolog Moderate Correction Scale/ SSI (0-15 units) TID AC + HS     MD- Note Hypoglycemia this AM (CBG 58)  Please consider reducing the Levemir to 18 units QHS (30% reduction of total dose)    --Will follow patient during hospitalization--  Wyn Quaker RN, MSN, [place] [facility] Coordinator Inpatient Glycemic Control Team Team Pager: [phone] (8a-5p)

## 2022-05-19 NOTE — Significant Event (Addendum)
Hypoglycemic Event  CBG: 58  Treatment: 8 oz juice/soda,12.5g dextrose  Symptoms: lethargy, Pale, sweaty, hungry, dizzy,nausea  Follow-up CBG: Time:603 CBG Result:324  Possible Reasons for Event: Inadequate meal intake and Unknown  Comments/MD notified:B. Randol Kern aware.    Jimmy Olson Jimmy Olson able to notify of his low blood sugar. He shares that he experienced this sometimes at home.Hypoglycemia/hyperglycemia education provided using video translator services,

## 2022-05-19 NOTE — Care Management Important Message (Signed)
Important Message  Patient Details  Name: GILFORD LARDIZABAL MRN: [id number] Date of Birth: 12/29/1960   Medicare Important Message Given:  N/A - LOS <3 / Initial given by admissions     Dannette Barbara 05/19/2022, 10:47 AM

## 2022-05-22 ENCOUNTER — Encounter: Payer: Self-pay | Admitting: *Deleted

## 2022-05-22 ENCOUNTER — Inpatient Hospital Stay
Admission: EM | Admit: 2022-05-22 | Discharge: 2022-05-28 | DRG: 280 | Disposition: A | Discharge status: Home / Self Care | Payer: Medicare Other | Attending: Internal Medicine | Admitting: Internal Medicine

## 2022-05-22 ENCOUNTER — Other Ambulatory Visit: Payer: Self-pay

## 2022-05-22 ENCOUNTER — Emergency Department: Payer: Medicare Other

## 2022-05-22 DIAGNOSIS — Z794 Long term (current) use of insulin: Secondary | ICD-10-CM

## 2022-05-22 DIAGNOSIS — Z8249 Family history of ischemic heart disease and other diseases of the circulatory system: Secondary | ICD-10-CM

## 2022-05-22 DIAGNOSIS — I5043 Acute on chronic combined systolic (congestive) and diastolic (congestive) heart failure: Secondary | ICD-10-CM | POA: Diagnosis present

## 2022-05-22 DIAGNOSIS — Z8673 Personal history of transient ischemic attack (TIA), and cerebral infarction without residual deficits: Secondary | ICD-10-CM

## 2022-05-22 DIAGNOSIS — F32A Depression, unspecified: Secondary | ICD-10-CM | POA: Diagnosis present

## 2022-05-22 DIAGNOSIS — I252 Old myocardial infarction: Secondary | ICD-10-CM

## 2022-05-22 DIAGNOSIS — R0602 Shortness of breath: Secondary | ICD-10-CM | POA: Diagnosis not present

## 2022-05-22 DIAGNOSIS — Z6827 Body mass index (BMI) 27.0-27.9, adult: Secondary | ICD-10-CM

## 2022-05-22 DIAGNOSIS — K219 Gastro-esophageal reflux disease without esophagitis: Secondary | ICD-10-CM | POA: Insufficient documentation

## 2022-05-22 DIAGNOSIS — D631 Anemia in chronic kidney disease: Secondary | ICD-10-CM | POA: Diagnosis present

## 2022-05-22 DIAGNOSIS — I161 Hypertensive emergency: Secondary | ICD-10-CM | POA: Diagnosis present

## 2022-05-22 DIAGNOSIS — I25110 Atherosclerotic heart disease of native coronary artery with unstable angina pectoris: Secondary | ICD-10-CM | POA: Diagnosis present

## 2022-05-22 DIAGNOSIS — I132 Hypertensive heart and chronic kidney disease with heart failure and with stage 5 chronic kidney disease, or end stage renal disease: Secondary | ICD-10-CM | POA: Diagnosis not present

## 2022-05-22 DIAGNOSIS — Z833 Family history of diabetes mellitus: Secondary | ICD-10-CM

## 2022-05-22 DIAGNOSIS — E1165 Type 2 diabetes mellitus with hyperglycemia: Secondary | ICD-10-CM | POA: Diagnosis present

## 2022-05-22 DIAGNOSIS — Z8616 Personal history of COVID-19: Secondary | ICD-10-CM

## 2022-05-22 DIAGNOSIS — E1122 Type 2 diabetes mellitus with diabetic chronic kidney disease: Secondary | ICD-10-CM | POA: Diagnosis present

## 2022-05-22 DIAGNOSIS — Z7901 Long term (current) use of anticoagulants: Secondary | ICD-10-CM

## 2022-05-22 DIAGNOSIS — E11649 Type 2 diabetes mellitus with hypoglycemia without coma: Secondary | ICD-10-CM | POA: Diagnosis not present

## 2022-05-22 DIAGNOSIS — I25118 Atherosclerotic heart disease of native coronary artery with other forms of angina pectoris: Secondary | ICD-10-CM | POA: Diagnosis present

## 2022-05-22 DIAGNOSIS — R079 Chest pain, unspecified: Secondary | ICD-10-CM

## 2022-05-22 DIAGNOSIS — I428 Other cardiomyopathies: Secondary | ICD-10-CM | POA: Diagnosis present

## 2022-05-22 DIAGNOSIS — J9601 Acute respiratory failure with hypoxia: Secondary | ICD-10-CM | POA: Diagnosis present

## 2022-05-22 DIAGNOSIS — I214 Non-ST elevation (NSTEMI) myocardial infarction: Secondary | ICD-10-CM | POA: Diagnosis present

## 2022-05-22 DIAGNOSIS — Z1152 Encounter for screening for COVID-19: Secondary | ICD-10-CM

## 2022-05-22 DIAGNOSIS — Z79899 Other long term (current) drug therapy: Secondary | ICD-10-CM

## 2022-05-22 DIAGNOSIS — Z992 Dependence on renal dialysis: Secondary | ICD-10-CM

## 2022-05-22 DIAGNOSIS — N2581 Secondary hyperparathyroidism of renal origin: Secondary | ICD-10-CM | POA: Diagnosis present

## 2022-05-22 DIAGNOSIS — Z841 Family history of disorders of kidney and ureter: Secondary | ICD-10-CM

## 2022-05-22 DIAGNOSIS — I2584 Coronary atherosclerosis due to calcified coronary lesion: Secondary | ICD-10-CM | POA: Diagnosis present

## 2022-05-22 DIAGNOSIS — E663 Overweight: Secondary | ICD-10-CM | POA: Diagnosis present

## 2022-05-22 DIAGNOSIS — Z86718 Personal history of other venous thrombosis and embolism: Secondary | ICD-10-CM

## 2022-05-22 DIAGNOSIS — I160 Hypertensive urgency: Secondary | ICD-10-CM | POA: Diagnosis present

## 2022-05-22 DIAGNOSIS — N186 End stage renal disease: Secondary | ICD-10-CM

## 2022-05-22 DIAGNOSIS — J810 Acute pulmonary edema: Secondary | ICD-10-CM | POA: Diagnosis present

## 2022-05-22 DIAGNOSIS — E785 Hyperlipidemia, unspecified: Secondary | ICD-10-CM | POA: Insufficient documentation

## 2022-05-22 MED ORDER — GUAIFENESIN-CODEINE 100-10 MG/5ML PO SOLN
5.0000 mL | Freq: Once | ORAL | Status: AC
  Administered 2022-05-23: 5 mL via ORAL
  Filled 2022-05-22: qty 5

## 2022-05-22 MED ORDER — ONDANSETRON HCL 4 MG/2ML IJ SOLN: 4.0000 mg | Freq: Once | INTRAMUSCULAR | Status: DC

## 2022-05-22 MED ORDER — HYDRALAZINE HCL 20 MG/ML IJ SOLN
10.0000 mg | Freq: Once | INTRAMUSCULAR | Status: AC
  Administered 2022-05-23: 10 mg via INTRAVENOUS
  Filled 2022-05-22: qty 1

## 2022-05-22 NOTE — ED Provider Notes (Signed)
[facility] Provider Note    Event Date/Time   First MD Initiated Contact with Patient 05/22/22 2329     (approximate)   History   Shortness of Breath   HPI  Jimmy Olson is a 61 y.o. male history of end-stage renal disease on hemodialysis Monday Wednesday Friday, CAD, CHF, hypertension, diabetes, DVT on apixaban who presents to the emergency department with shortness of breath, chest pressure, nonproductive cough that started yesterday.  No missed dialysis sessions.  Hypertensive here.  On multiple blood pressure medications and reports compliance.  Also slightly hyperglycemic with sugars in the 300s with EMS.  No hypoxia.  Does not wear oxygen at home.  Has been on dialysis since November 2022.  Has a tunneled catheter in the right chest wall.   History provided by patient and wife, EMS.    Past Medical History:  Diagnosis Date   Anemia    Anxiety and depression    ATN (acute tubular necrosis) (HCC)    CAD (coronary artery disease)    a.) R/LHC 07/19/2020: 20% pRCA, 30% mRCA, 50% dRCA, 50% RPAV, 40% pLAD, 70% D1, 60% mLAD; LVEDP 19 mmHg; PA 37/16 (26 mmHg); PCWP 13 mmHg. b.) LHC 06/03/2021: EF 45-50%; LVEDP 18 mmHg; 40% pLAD, 60% mLAD, 20% pRCA, 30% mRCA, 70% D1, 30% dRCA, 40% RPDA.   Chronic HFrEF (heart failure with reduced ejection fraction) ([place])    a.) 06/2020 Echo: EF 30-35%, glob HK, GrII DD, nl RV fxn. Mild LAE. b.)  TTE 08/08/2021: EF 40-45%; global HK; mild LVH; GLS -8.6; mild LA dilation.   DVT of axillary vein, acute left ([place]) 05/2021   a.) Tx'd with apixaban   ESRD (end stage renal disease) on dialysis [facility])    a.) M-W-F   History of 2019 novel coronavirus disease (COVID-19) 03/06/2020   a.) s/p Tx with monoclonal Ab infusion   HLD (hyperlipidemia)    Hyperkalemia 12/23/2020   Hypertension    Left carotid artery stenosis 08/18/2021   a.) Carotid Doppler 51/07/5850: 77-82% LICA stenosis   Long term current use of anticoagulant    a.)  apixaban for DVT   NICM (nonischemic cardiomyopathy) ([place])    a.) 07/2009 MV: EF 48%; b.) 08/2019 Echo: EF 45-50%. Global HK. Mod LVH. GrIDD; c.) 06/2020 Echo: EF 30-35%, glob HK, mild LVH, GrII DD, nl RV fxn, mild LAE; d.) 06/2020 Cath: nonobs dzs. e.)  LHC 06/03/2021; EF 45-50%; LVEDP 18 mmHg; nonobstructive CAD. f.) b.)  TTE 08/08/2021: EF 40-45%; global HK; mild LVH; GLS -8.6; mild LA dilation.   NSTEMI (non-ST elevated myocardial infarction) ([place]) 07/15/2020   a.) hsTnI trended: 124--> 127--> 176 --> 190 --> 200 ng/L.   Sepsis ([place])    Suicidal ideations    TIA (transient ischemic attack) 06/2021   Type 2 diabetes mellitus treated with insulin [facility])     Past Surgical History:  Procedure Laterality Date   APPENDECTOMY     AV FISTULA PLACEMENT Left 09/29/2021   Procedure: ARTERIOVENOUS (AV) FISTULA CREATION ( RADIAL CEPHALIC);  Surgeon: Algernon Huxley, MD;  Location: [facility] ORS;  Service: Vascular;  Laterality: Left;   CARDIAC CATHETERIZATION     DIALYSIS/PERMA CATHETER INSERTION N/A 06/02/2021   Procedure: DIALYSIS/PERMA CATHETER INSERTION;  Surgeon: Algernon Huxley, MD;  Location: [place] CV LAB;  Service: Cardiovascular;  Laterality: N/A;   LEFT HEART CATH AND CORONARY ANGIOGRAPHY N/A 06/03/2021   Procedure: LEFT HEART CATH AND CORONARY ANGIOGRAPHY;  Surgeon: Wellington Hampshire,  MD;  Location: [facility];  Service: Cardiovascular;  Laterality: N/A;   LIGATION OF ARTERIOVENOUS  FISTULA Left 10/02/2021   Procedure: LIGATION OF ARTERIOVENOUS  FISTULA;  Surgeon: Evaristo Bury, MD;  Location: [facility] ORS;  Service: Vascular;  Laterality: Left;   NASAL SINUS SURGERY     RIGHT/LEFT HEART CATH AND CORONARY ANGIOGRAPHY N/A 07/19/2020   Procedure: RIGHT/LEFT HEART CATH AND CORONARY ANGIOGRAPHY;  Surgeon: Wellington Hampshire, MD;  Location: [place] CV LAB;  Service: Cardiovascular;  Laterality: N/A;    MEDICATIONS:  Prior to Admission medications   Medication Sig Start Date End Date  Taking? Authorizing Provider  apixaban (ELIQUIS) 5 MG TABS tablet TAKE 1 TABLET BY MOUTH TWICE DAILY. 05/19/22  Yes Sreenath, Sudheer B, MD  carvedilol (COREG) 25 MG tablet Take 1 tablet (25 mg total) by mouth 2 (two) times daily. 11/29/21  Yes Hackney, Otila Kluver A, FNP  cefdinir (OMNICEF) 300 MG capsule Take 1 capsule (300 mg total) by mouth 2 (two) times daily for 3 days. 05/19/22 05/22/22 Yes Sreenath, Sudheer B, MD  escitalopram (LEXAPRO) 10 MG tablet Take 1 tablet (10 mg total) by mouth at bedtime. 05/19/22 06/18/22 Yes Sreenath, Sudheer B, MD  hydrALAZINE (APRESOLINE) 100 MG tablet Take 1 tablet (100 mg total) by mouth 2 (two) times daily. 05/19/22 06/18/22 Yes Sreenath, Sudheer B, MD  insulin detemir (LEVEMIR FLEXPEN) 100 UNIT/ML FlexPen Inject 20 Units into the skin once daily at bedtime. Patient taking differently: Inject 18 Units into the skin at bedtime. 10/10/21 05/22/22 Yes   Insulin Pen Needle 32G X 4 MM MISC USE AS DIRECTED 06/07/21  Yes Sreenath, Sudheer B, MD  isosorbide mononitrate (IMDUR) 30 MG 24 hr tablet Take 1 tablet (30 mg total) by mouth in the morning and at bedtime. 11/29/21  Yes Hackney, Otila Kluver A, FNP  losartan (COZAAR) 100 MG tablet Take 1 tablet (100 mg total) by mouth daily. 05/19/22 06/18/22 Yes Sreenath, Sudheer B, MD  NOVOLOG FLEXPEN 100 UNIT/ML FlexPen Inject 0-15 Units into the skin 3 (three) times daily with meals. 0-15 units tid 0-5 units qhs 12/28/21  Yes [provider]  Nutritional Supplements (FEEDING SUPPLEMENT, NEPRO CARB STEADY,) LIQD Take 237 mLs by mouth 3 (three) times daily between meals. 10/08/21  Yes Nicole Kindred A, DO  pantoprazole (PROTONIX) 20 MG tablet Take 1 tablet (20 mg total) by mouth daily. 05/19/22 06/18/22 Yes Sreenath, Sudheer B, MD  rosuvastatin (CRESTOR) 40 MG tablet Take 1 tablet (40 mg total) by mouth daily. 05/19/22 06/18/22 Yes Sreenath, Sudheer B, MD  torsemide (DEMADEX) 20 MG tablet Take 1 tablet (20 mg total) by mouth daily. Takes on Sat  and Sun 05/19/22 08/11/22 Yes Sreenath, Sudheer B, MD  acetaminophen (TYLENOL) 325 MG tablet Take 650 mg by mouth every 6 (six) hours as needed.    [provider]  albuterol (PROVENTIL HFA) 108 (90 Base) MCG/ACT inhaler INHALE 2 PUFFS INTO THE LUNGS ONCE EVERY 6 HOURS AS NEEDED FOR WHEEZING OR SHORTNESS OF BREATH. 07/13/21     nitroGLYCERIN (NITROSTAT) 0.4 MG SL tablet Place 1 tablet (0.4 mg total) under the tongue every 5 (five) minutes as needed for chest pain. Patient not taking: Reported on 05/22/2022 05/19/22   Sidney Ace, MD  gabapentin (NEURONTIN) 300 MG capsule Take 1 capsule (300 mg total) by mouth at bedtime for 7 days. 06/07/21 08/18/21  Sidney Ace, MD  isosorbide dinitrate (ISORDIL) 30 MG tablet TAKE 1 TABLET BY MOUTH ONCE DAILY. 07/12/21 07/13/21  Physical Exam   Triage Vital Signs: ED Triage Vitals  Enc Vitals Group     BP 05/22/22 2334 (!) 210/106     Pulse Rate 05/22/22 2331 86     Resp 05/22/22 2331 (!) 24     Temp 05/22/22 2331 98.6 F (37 C)     Temp Source 05/22/22 2331 Oral     SpO2 05/22/22 2331 96 %     Weight 05/22/22 2332 147 lb 11.3 oz (67 kg)     Height 05/22/22 2332 5\' 6"  (1.676 m)     Head Circumference --      Peak Flow --      Pain Score 05/22/22 2329 0     Pain Loc --      Pain Edu? --      Excl. in [place]? --     Most recent vital signs: Vitals:   05/23/22 0615 05/23/22 0630  BP: 134/68 130/74  Pulse: 83 81  Resp: 17 18  Temp:    SpO2: 95% 96%    CONSTITUTIONAL: Alert and oriented and responds appropriately to questions.  Chronically ill-appearing HEAD: Normocephalic, atraumatic EYES: Conjunctivae clear, pupils appear equal, sclera nonicteric ENT: normal nose; moist mucous membranes NECK: Supple, normal ROM CARD: RRR; S1 and S2 appreciated; no murmurs, no clicks, no rubs, no gallops RESP: Normal chest excursion without splinting or tachypnea; breath sounds clear and equal bilaterally; no wheezes, no rhonchi, no rales,  no hypoxia or respiratory distress, speaking full sentences ABD/GI: Normal bowel sounds; non-distended; soft, non-tender, no rebound, no guarding, no peritoneal signs BACK: The back appears normal EXT: Normal ROM in all joints; no deformity noted, no edema; no cyanosis SKIN: Normal color for age and race; warm; no rash on exposed skin, tunneled catheter in the right chest wall NEURO: Moves all extremities equally, normal speech, no facial asymmetry PSYCH: The patient's mood and manner are appropriate.   ED Results / Procedures / Treatments   LABS: (all labs ordered are listed, but only abnormal results are displayed) Labs Reviewed  BASIC METABOLIC PANEL - Abnormal; Notable for the following components:      Result Value   Glucose, Bld 294 (*)    BUN 51 (*)    Creatinine, Ser 5.61 (*)    Calcium 8.8 (*)    GFR, Estimated 11 (*)    All other components within normal limits  CBC - Abnormal; Notable for the following components:   RBC 3.10 (*)    Hemoglobin 9.8 (*)    HCT 28.4 (*)    All other components within normal limits  BRAIN NATRIURETIC PEPTIDE - Abnormal; Notable for the following components:   B Natriuretic Peptide 3,889.8 (*)    All other components within normal limits  HEPARIN LEVEL (UNFRACTIONATED) - Abnormal; Notable for the following components:   Heparin Unfractionated >1.10 (*)    All other components within normal limits  CBC - Abnormal; Notable for the following components:   RBC 2.97 (*)    Hemoglobin 9.1 (*)    HCT 27.1 (*)    All other components within normal limits  CBG MONITORING, ED - Abnormal; Notable for the following components:   Glucose-Capillary 390 (*)    All other components within normal limits  CBG MONITORING, ED - Abnormal; Notable for the following components:   Glucose-Capillary 388 (*)    All other components within normal limits  TROPONIN I (HIGH SENSITIVITY) - Abnormal; Notable for the following components:   Troponin I (High  Sensitivity) 478 (*)    All other components within normal limits  TROPONIN I (HIGH SENSITIVITY) - Abnormal; Notable for the following components:   Troponin I (High Sensitivity) 462 (*)    All other components within normal limits  RESP PANEL BY RT-PCR (FLU A&B, COVID) ARPGX2  MAGNESIUM  APTT  PROTIME-INR  BASIC METABOLIC PANEL  HEPARIN LEVEL (UNFRACTIONATED)  APTT     EKG:  EKG Interpretation  Date/Time:  Monday May 22 2022 23:39:45 EST Ventricular Rate:  86 PR Interval:  180 QRS Duration: 102 QT Interval:  404 QTC Calculation: 483 R Axis:   36 Text Interpretation: Normal sinus rhythm Minimal voltage criteria for LVH, may be normal variant ( Cornell product ) Nonspecific ST and T wave abnormality Prolonged QT Abnormal ECG When compared with ECG of 18-May-2022 01:27, PREVIOUS ECG IS PRESENT Confirmed by Pryor Curia [phone]) on 05/23/2022 12:12:32 AM           RADIOLOGY: My personal review and interpretation of imaging: Chest x-ray shows pulmonary edema.  I have personally reviewed all radiology reports.   DG Chest Port 1 View  Result Date: 05/23/2022 CLINICAL DATA:  Dyspnea EXAM: PORTABLE CHEST 1 VIEW COMPARISON:  05/17/2022 FINDINGS: The lungs are symmetrically well expanded. No pneumothorax or pleural effusion. Mild cardiomegaly is stable. Diffuse interstitial pulmonary edema has progressed slightly in the interval and is now mild-to-moderate in severity. Right internal jugular hemodialysis catheter tip again noted at the superior cavoatrial junction. No acute bone abnormality. IMPRESSION: 1. Progressive interstitial pulmonary edema, now mild-to-moderate in severity. Electronically Signed   By: Fidela Salisbury M.D.   On: 05/23/2022 00:10     PROCEDURES:  Critical Care performed: Yes, see critical care procedure note(s)   CRITICAL CARE Performed by: Cyril Mourning Edie Darley   Total critical care time: 45 minutes  Critical care time was exclusive of separately billable  procedures and treating other patients.  Critical care was necessary to treat or prevent imminent or life-threatening deterioration.  Critical care was time spent personally by me on the following activities: development of treatment plan with patient and/or surrogate as well as nursing, discussions with consultants, evaluation of patient's response to treatment, examination of patient, obtaining history from patient or surrogate, ordering and performing treatments and interventions, ordering and review of laboratory studies, ordering and review of radiographic studies, pulse oximetry and re-evaluation of patient's condition.   Marland Kitchen1-3 Lead EKG Interpretation  Performed by: Markesha Hannig, Delice Bison, DO Authorized by: Deby Adger, Delice Bison, DO     Interpretation: normal     ECG rate:  86   ECG rate assessment: normal     Rhythm: sinus rhythm     Ectopy: none     Conduction: normal       IMPRESSION / MDM / ASSESSMENT AND PLAN / ED COURSE  I reviewed the triage vital signs and the nursing notes.    Patient here with shortness of breath, chest pressure, hypertension.  The patient is on the cardiac monitor to evaluate for evidence of arrhythmia and/or significant heart rate changes.   DIFFERENTIAL DIAGNOSIS (includes but not limited to):   Pulmonary edema, CHF exacerbation, ACS, hypertensive emergency/urgency, viral URI, pneumonia   Patient's presentation is most consistent with acute presentation with potential threat to life or bodily function.   PLAN: Will obtain CBC, BMP, troponin x 2, BNP given history of CHF.  EKG has significant artifact and will need to be repeated.  QTc slightly prolonged.  Will obtain magnesium level.  Will  give medications for cough and hydralazine for blood pressure.  COVID and flu swabs pending.   MEDICATIONS GIVEN IN ED: Medications  nicardipine (CARDENE) 20mg  in 0.86% saline 271ml IV infusion (0.1 mg/ml) (3 mg/hr Intravenous Infusion Verify 05/23/22 0642)   carvedilol (COREG) tablet 25 mg (has no administration in time range)  hydrALAZINE (APRESOLINE) tablet 100 mg (100 mg Oral Given 05/23/22 0248)  isosorbide mononitrate (IMDUR) 24 hr tablet 30 mg (has no administration in time range)  losartan (COZAAR) tablet 100 mg (has no administration in time range)  nitroGLYCERIN (NITROSTAT) SL tablet 0.4 mg (has no administration in time range)  rosuvastatin (CRESTOR) tablet 40 mg (has no administration in time range)  escitalopram (LEXAPRO) tablet 10 mg (10 mg Oral Given 05/23/22 0248)  insulin detemir (LEVEMIR) injection 18 Units (18 Units Subcutaneous Given 05/23/22 0247)  pantoprazole (PROTONIX) EC tablet 20 mg (has no administration in time range)  albuterol (PROVENTIL) (2.5 MG/3ML) 0.083% nebulizer solution 2.5 mg (has no administration in time range)  acetaminophen (TYLENOL) tablet 650 mg (has no administration in time range)    Or  acetaminophen (TYLENOL) suppository 650 mg (has no administration in time range)  traZODone (DESYREL) tablet 25 mg (has no administration in time range)  magnesium hydroxide (MILK OF MAGNESIA) suspension 30 mL (has no administration in time range)  ondansetron (ZOFRAN) tablet 4 mg (has no administration in time range)    Or  ondansetron (ZOFRAN) injection 4 mg (has no administration in time range)  furosemide (LASIX) injection 60 mg (has no administration in time range)  heparin ADULT infusion 100 units/mL (25000 units/289mL) (800 Units/hr Intravenous Infusion Verify 05/23/22 0642)  insulin aspart (novoLOG) injection 0-20 Units (has no administration in time range)  insulin aspart (novoLOG) injection 0-5 Units (has no administration in time range)  guaiFENesin-codeine 100-10 MG/5ML solution 5 mL (5 mLs Oral Given 05/23/22 0026)  hydrALAZINE (APRESOLINE) injection 10 mg (10 mg Intravenous Given 05/23/22 0028)  furosemide (LASIX) injection 40 mg (40 mg Intravenous Given 05/23/22 0050)  aspirin chewable tablet 324 mg (324 mg  Oral Given 05/23/22 0113)     ED COURSE: Patient's labs reveal stable anemia.  Hyperglycemia without DKA.  Troponin is elevated likely from demand from hypertension, hypertensive emergency.  Will give aspirin.  Initial EKG showed significant artifact.  Repeat shows no ischemic change.  Second pending.  Normal electrolytes today.  BNP 3800.  Chest x-ray reviewed and interpreted by myself and the radiologist and shows pulmonary edema.  Continues to be hypertensive despite Lasix and hydralazine.  Will start Cardene infusion given concerns for hypertensive emergency.  I feel he will need diuresis, dialysis but does not need it emergently as he is not hypoxic or in distress.  Will discuss with hospitalist.   CONSULTS:  Consulted and discussed patient's case with hospitalist, Dr. Sidney Ace.  I have recommended admission and consulting physician agrees and will place admission orders.  Patient (and family if present) agree with this plan.   I reviewed all nursing notes, vitals, pertinent previous records.  All labs, EKGs, imaging ordered have been independently reviewed and interpreted by myself.    OUTSIDE RECORDS REVIEWED: Reviewed patient's last admission to the hospital for shortness of breath on 05/17/2022.       FINAL CLINICAL IMPRESSION(S) / ED DIAGNOSES   Final diagnoses:  Shortness of breath  Chest pain, unspecified type  Hypertensive emergency     Rx / DC Orders   ED Discharge Orders     None  Note:  This document was prepared using Dragon voice recognition software and may include unintentional dictation errors.   Damel Querry, Delice Bison, DO 05/23/22 [phone]

## 2022-05-22 NOTE — ED Triage Notes (Signed)
Pt brought in via ems from home with sob.  No chest pain.  Last dialysis was 3 days ago.  Pt is on 2 liters oxygen [place] iv in place

## 2022-05-23 ENCOUNTER — Other Ambulatory Visit: Payer: Self-pay

## 2022-05-23 ENCOUNTER — Other Ambulatory Visit: Payer: Self-pay | Admitting: Nephrology

## 2022-05-23 DIAGNOSIS — I5043 Acute on chronic combined systolic (congestive) and diastolic (congestive) heart failure: Secondary | ICD-10-CM | POA: Diagnosis present

## 2022-05-23 DIAGNOSIS — I214 Non-ST elevation (NSTEMI) myocardial infarction: Secondary | ICD-10-CM

## 2022-05-23 DIAGNOSIS — I160 Hypertensive urgency: Secondary | ICD-10-CM | POA: Diagnosis not present

## 2022-05-23 DIAGNOSIS — Z992 Dependence on renal dialysis: Secondary | ICD-10-CM

## 2022-05-23 DIAGNOSIS — K219 Gastro-esophageal reflux disease without esophagitis: Secondary | ICD-10-CM | POA: Insufficient documentation

## 2022-05-23 DIAGNOSIS — N186 End stage renal disease: Secondary | ICD-10-CM | POA: Diagnosis present

## 2022-05-23 DIAGNOSIS — J810 Acute pulmonary edema: Secondary | ICD-10-CM | POA: Diagnosis present

## 2022-05-23 DIAGNOSIS — E1122 Type 2 diabetes mellitus with diabetic chronic kidney disease: Secondary | ICD-10-CM | POA: Diagnosis present

## 2022-05-23 DIAGNOSIS — E785 Hyperlipidemia, unspecified: Secondary | ICD-10-CM | POA: Insufficient documentation

## 2022-05-23 DIAGNOSIS — I132 Hypertensive heart and chronic kidney disease with heart failure and with stage 5 chronic kidney disease, or end stage renal disease: Secondary | ICD-10-CM | POA: Diagnosis present

## 2022-05-23 DIAGNOSIS — R079 Chest pain, unspecified: Secondary | ICD-10-CM | POA: Diagnosis not present

## 2022-05-23 DIAGNOSIS — I25110 Atherosclerotic heart disease of native coronary artery with unstable angina pectoris: Secondary | ICD-10-CM | POA: Diagnosis present

## 2022-05-23 DIAGNOSIS — Z1152 Encounter for screening for COVID-19: Secondary | ICD-10-CM | POA: Diagnosis not present

## 2022-05-23 DIAGNOSIS — Z79899 Other long term (current) drug therapy: Secondary | ICD-10-CM | POA: Diagnosis not present

## 2022-05-23 DIAGNOSIS — J9601 Acute respiratory failure with hypoxia: Secondary | ICD-10-CM | POA: Diagnosis present

## 2022-05-23 DIAGNOSIS — I2489 Other forms of acute ischemic heart disease: Secondary | ICD-10-CM | POA: Diagnosis not present

## 2022-05-23 DIAGNOSIS — R0602 Shortness of breath: Secondary | ICD-10-CM | POA: Diagnosis present

## 2022-05-23 DIAGNOSIS — E1165 Type 2 diabetes mellitus with hyperglycemia: Secondary | ICD-10-CM | POA: Diagnosis present

## 2022-05-23 DIAGNOSIS — I161 Hypertensive emergency: Secondary | ICD-10-CM | POA: Diagnosis present

## 2022-05-23 DIAGNOSIS — F32A Depression, unspecified: Secondary | ICD-10-CM | POA: Diagnosis present

## 2022-05-23 DIAGNOSIS — E663 Overweight: Secondary | ICD-10-CM | POA: Diagnosis present

## 2022-05-23 DIAGNOSIS — Z7901 Long term (current) use of anticoagulants: Secondary | ICD-10-CM | POA: Diagnosis not present

## 2022-05-23 DIAGNOSIS — E11649 Type 2 diabetes mellitus with hypoglycemia without coma: Secondary | ICD-10-CM | POA: Diagnosis not present

## 2022-05-23 DIAGNOSIS — Z794 Long term (current) use of insulin: Secondary | ICD-10-CM | POA: Diagnosis not present

## 2022-05-23 DIAGNOSIS — Z8616 Personal history of COVID-19: Secondary | ICD-10-CM | POA: Diagnosis not present

## 2022-05-23 DIAGNOSIS — D631 Anemia in chronic kidney disease: Secondary | ICD-10-CM | POA: Diagnosis present

## 2022-05-23 DIAGNOSIS — I428 Other cardiomyopathies: Secondary | ICD-10-CM | POA: Diagnosis present

## 2022-05-23 DIAGNOSIS — I25118 Atherosclerotic heart disease of native coronary artery with other forms of angina pectoris: Secondary | ICD-10-CM | POA: Diagnosis present

## 2022-05-23 DIAGNOSIS — I2510 Atherosclerotic heart disease of native coronary artery without angina pectoris: Secondary | ICD-10-CM | POA: Diagnosis not present

## 2022-05-23 DIAGNOSIS — N2581 Secondary hyperparathyroidism of renal origin: Secondary | ICD-10-CM | POA: Diagnosis present

## 2022-05-23 LAB — BASIC METABOLIC PANEL
Anion gap: 9 (ref 5–15)
Anion gap: 9 (ref 5–15)
BUN: 51 mg/dL — ABNORMAL HIGH (ref 6–20)
BUN: 58 mg/dL — ABNORMAL HIGH (ref 6–20)
CO2: 25 mmol/L (ref 22–32)
CO2: 23 mmol/L (ref 22–32)
Calcium: 8.8 mg/dL — ABNORMAL LOW (ref 8.9–10.3)
Calcium: 8.3 mg/dL — ABNORMAL LOW (ref 8.9–10.3)
Chloride: 105 mmol/L (ref 98–111)
Chloride: 106 mmol/L (ref 98–111)
Creatinine, Ser: 5.61 mg/dL — ABNORMAL HIGH (ref 0.61–1.24)
Creatinine, Ser: 5.68 mg/dL — ABNORMAL HIGH (ref 0.61–1.24)
GFR, Estimated: 11 mL/min — ABNORMAL LOW (ref >60)
GFR, Estimated: 11 mL/min — ABNORMAL LOW (ref >60)
Glucose, Bld: 294 mg/dL — ABNORMAL HIGH (ref 70–99)
Glucose, Bld: 395 mg/dL — ABNORMAL HIGH (ref 70–99)
Potassium: 4.5 mmol/L (ref 3.5–5.1)
Potassium: 3.7 mmol/L (ref 3.5–5.1)
Sodium: 139 mmol/L (ref 135–145)
Sodium: 138 mmol/L (ref 135–145)

## 2022-05-23 LAB — CBC
HCT: 27.1 % — ABNORMAL LOW (ref 39.0–52.0)
HCT: 28.4 % — ABNORMAL LOW (ref 39.0–52.0)
Hemoglobin: 9.1 g/dL — ABNORMAL LOW (ref 13.0–17.0)
Hemoglobin: 9.8 g/dL — ABNORMAL LOW (ref 13.0–17.0)
MCH: 30.6 pg (ref 26.0–34.0)
MCH: 31.6 pg (ref 26.0–34.0)
MCHC: 33.6 g/dL (ref 30.0–36.0)
MCHC: 34.5 g/dL (ref 30.0–36.0)
MCV: 91.2 fL (ref 80.0–100.0)
MCV: 91.6 fL (ref 80.0–100.0)
Platelets: 174 10*3/uL (ref 150–400)
Platelets: 198 10*3/uL (ref 150–400)
RBC: 2.97 MIL/uL — ABNORMAL LOW (ref 4.22–5.81)
RBC: 3.1 MIL/uL — ABNORMAL LOW (ref 4.22–5.81)
RDW: 12.6 % (ref 11.5–15.5)
RDW: 12.6 % (ref 11.5–15.5)
WBC: 5.3 10*3/uL (ref 4.0–10.5)
WBC: 5.6 10*3/uL (ref 4.0–10.5)
nRBC: 0 % (ref 0.0–0.2)
nRBC: 0 % (ref 0.0–0.2)

## 2022-05-23 LAB — TROPONIN I (HIGH SENSITIVITY)
Troponin I (High Sensitivity): 478 ng/L (ref <18)
Troponin I (High Sensitivity): 462 ng/L (ref <18)

## 2022-05-23 LAB — CBG MONITORING, ED
Glucose-Capillary: 390 mg/dL — ABNORMAL HIGH (ref 70–99)
Glucose-Capillary: 388 mg/dL — ABNORMAL HIGH (ref 70–99)
Glucose-Capillary: 354 mg/dL — ABNORMAL HIGH (ref 70–99)
Glucose-Capillary: 160 mg/dL — ABNORMAL HIGH (ref 70–99)
Glucose-Capillary: 159 mg/dL — ABNORMAL HIGH (ref 70–99)
Glucose-Capillary: 76 mg/dL (ref 70–99)

## 2022-05-23 LAB — HEPARIN LEVEL (UNFRACTIONATED)
Heparin Unfractionated: >1.1 IU/mL — ABNORMAL HIGH (ref 0.30–0.70)
Heparin Unfractionated: >1.1 IU/mL — ABNORMAL HIGH (ref 0.30–0.70)

## 2022-05-23 LAB — APTT
aPTT: 29 seconds (ref 24–36)
aPTT: 68 seconds — ABNORMAL HIGH (ref 24–36)

## 2022-05-23 LAB — BRAIN NATRIURETIC PEPTIDE: B Natriuretic Peptide: 3889.8 pg/mL — ABNORMAL HIGH (ref 0.0–100.0)

## 2022-05-23 LAB — MAGNESIUM: Magnesium: 2.1 mg/dL (ref 1.7–2.4)

## 2022-05-23 LAB — PROTIME-INR
INR: 1.1 (ref 0.8–1.2)
Prothrombin Time: 14.2 seconds (ref 11.4–15.2)

## 2022-05-23 LAB — RESP PANEL BY RT-PCR (FLU A&B, COVID) ARPGX2
Influenza A by PCR: NEGATIVE
Influenza B by PCR: NEGATIVE
SARS Coronavirus 2 by RT PCR: NEGATIVE

## 2022-05-23 LAB — HEPATITIS B SURFACE ANTIGEN: Hepatitis B Surface Ag: NONREACTIVE

## 2022-05-23 MED ORDER — MAGNESIUM HYDROXIDE 400 MG/5ML PO SUSP: 30.0000 mL | Freq: Every day | ORAL | Status: DC | PRN

## 2022-05-23 MED ORDER — ESCITALOPRAM OXALATE 10 MG PO TABS
10.0000 mg | ORAL_TABLET | Freq: Every day | ORAL | Status: DC
  Administered 2022-05-23 – 2022-05-27 (×6): 10 mg via ORAL
  Filled 2022-05-23 (×6): qty 1

## 2022-05-23 MED ORDER — ROSUVASTATIN CALCIUM 10 MG PO TABS
40.0000 mg | ORAL_TABLET | Freq: Every day | ORAL | Status: DC
  Administered 2022-05-23 – 2022-05-25 (×3): 40 mg via ORAL
  Filled 2022-05-23 (×2): qty 2
  Filled 2022-05-23 (×2): qty 4

## 2022-05-23 MED ORDER — HEPARIN SODIUM (PORCINE) 1000 UNIT/ML DIALYSIS
1000.0000 [IU] | INTRAMUSCULAR | Status: DC | PRN
  Administered 2022-05-25: 1000 [IU]

## 2022-05-23 MED ORDER — TRAZODONE HCL 50 MG PO TABS: 25.0000 mg | ORAL_TABLET | Freq: Every evening | ORAL | Status: DC | PRN

## 2022-05-23 MED ORDER — ACETAMINOPHEN 650 MG RE SUPP: 650.0000 mg | Freq: Four times a day (QID) | RECTAL | Status: DC | PRN

## 2022-05-23 MED ORDER — ISOSORBIDE MONONITRATE ER 30 MG PO TB24
30.0000 mg | ORAL_TABLET | Freq: Every day | ORAL | Status: DC
  Administered 2022-05-23 – 2022-05-25 (×3): 30 mg via ORAL
  Filled 2022-05-23 (×3): qty 1

## 2022-05-23 MED ORDER — FUROSEMIDE 10 MG/ML IJ SOLN
80.0000 mg | Freq: Two times a day (BID) | INTRAMUSCULAR | Status: DC
  Administered 2022-05-24 – 2022-05-26 (×4): 80 mg via INTRAVENOUS
  Filled 2022-05-23 (×4): qty 8

## 2022-05-23 MED ORDER — LIDOCAINE HCL (PF) 1 % IJ SOLN: 5.0000 mL | INTRAMUSCULAR | Status: DC | PRN

## 2022-05-23 MED ORDER — INSULIN ASPART 100 UNIT/ML IJ SOLN: 0.0000 [IU] | Freq: Every day | INTRAMUSCULAR | Status: DC

## 2022-05-23 MED ORDER — HEPARIN (PORCINE) 25000 UT/250ML-% IV SOLN
1050.0000 [IU]/h | INTRAVENOUS | Status: DC
  Administered 2022-05-23: 800 [IU]/h via INTRAVENOUS
  Filled 2022-05-23 (×3): qty 250

## 2022-05-23 MED ORDER — PENTAFLUOROPROP-TETRAFLUOROETH EX AERO: 1.0000 | INHALATION_SPRAY | CUTANEOUS | Status: DC | PRN

## 2022-05-23 MED ORDER — INSULIN ASPART 100 UNIT/ML IJ SOLN
3.0000 [IU] | Freq: Three times a day (TID) | INTRAMUSCULAR | Status: DC
  Administered 2022-05-24: 3 [IU] via SUBCUTANEOUS
  Filled 2022-05-23 (×2): qty 1

## 2022-05-23 MED ORDER — LOSARTAN POTASSIUM 50 MG PO TABS
100.0000 mg | ORAL_TABLET | Freq: Every day | ORAL | Status: DC
  Administered 2022-05-23 – 2022-05-28 (×6): 100 mg via ORAL
  Filled 2022-05-23 (×6): qty 2

## 2022-05-23 MED ORDER — APIXABAN 5 MG PO TABS: 5.0000 mg | ORAL_TABLET | Freq: Two times a day (BID) | ORAL | Status: DC

## 2022-05-23 MED ORDER — HYDRALAZINE HCL 50 MG PO TABS
100.0000 mg | ORAL_TABLET | Freq: Two times a day (BID) | ORAL | Status: DC
  Administered 2022-05-23 – 2022-05-28 (×10): 100 mg via ORAL
  Filled 2022-05-23 (×11): qty 2

## 2022-05-23 MED ORDER — INSULIN DETEMIR 100 UNIT/ML ~~LOC~~ SOLN
10.0000 [IU] | Freq: Once | SUBCUTANEOUS | Status: DC
  Filled 2022-05-23: qty 0.1

## 2022-05-23 MED ORDER — INSULIN DETEMIR 100 UNIT/ML ~~LOC~~ SOLN
18.0000 [IU] | Freq: Every day | SUBCUTANEOUS | Status: DC
  Administered 2022-05-23 (×2): 18 [IU] via SUBCUTANEOUS
  Filled 2022-05-23 (×3): qty 0.18

## 2022-05-23 MED ORDER — NICARDIPINE HCL IN NACL 20-0.86 MG/200ML-% IV SOLN
3.0000 mg/h | INTRAVENOUS | Status: DC
  Administered 2022-05-23: 5 mg/h via INTRAVENOUS
  Filled 2022-05-23: qty 200

## 2022-05-23 MED ORDER — LIDOCAINE-PRILOCAINE 2.5-2.5 % EX CREA: 1.0000 | TOPICAL_CREAM | CUTANEOUS | Status: DC | PRN

## 2022-05-23 MED ORDER — ACETAMINOPHEN 325 MG PO TABS
ORAL_TABLET | ORAL | Status: AC
  Filled 2022-05-23: qty 2

## 2022-05-23 MED ORDER — FUROSEMIDE 10 MG/ML IJ SOLN
60.0000 mg | Freq: Two times a day (BID) | INTRAMUSCULAR | Status: DC
  Administered 2022-05-23: 60 mg via INTRAVENOUS
  Administered 2022-05-23: 40 mg via INTRAVENOUS
  Filled 2022-05-23 (×2): qty 8

## 2022-05-23 MED ORDER — ANTICOAGULANT SODIUM CITRATE 4% (200MG/5ML) IV SOLN: 5.0000 mL | Status: DC | PRN

## 2022-05-23 MED ORDER — ACETAMINOPHEN 325 MG PO TABS
650.0000 mg | ORAL_TABLET | Freq: Four times a day (QID) | ORAL | Status: DC | PRN
  Administered 2022-05-23: 650 mg via ORAL

## 2022-05-23 MED ORDER — ONDANSETRON HCL 4 MG/2ML IJ SOLN: 4.0000 mg | Freq: Four times a day (QID) | INTRAMUSCULAR | Status: DC | PRN

## 2022-05-23 MED ORDER — ALBUTEROL SULFATE (2.5 MG/3ML) 0.083% IN NEBU: 2.5000 mg | INHALATION_SOLUTION | RESPIRATORY_TRACT | Status: DC | PRN

## 2022-05-23 MED ORDER — ALTEPLASE 2 MG IJ SOLR: 2.0000 mg | Freq: Once | INTRAMUSCULAR | Status: DC | PRN

## 2022-05-23 MED ORDER — NITROGLYCERIN 0.4 MG SL SUBL: 0.4000 mg | SUBLINGUAL_TABLET | SUBLINGUAL | Status: DC | PRN

## 2022-05-23 MED ORDER — INSULIN ASPART 100 UNIT/ML IJ SOLN
0.0000 [IU] | Freq: Three times a day (TID) | INTRAMUSCULAR | Status: DC
  Administered 2022-05-23: 4 [IU] via SUBCUTANEOUS
  Administered 2022-05-23: 20 [IU] via SUBCUTANEOUS
  Administered 2022-05-24: 11 [IU] via SUBCUTANEOUS
  Filled 2022-05-23 (×3): qty 1

## 2022-05-23 MED ORDER — FUROSEMIDE 10 MG/ML IJ SOLN
40.0000 mg | Freq: Once | INTRAMUSCULAR | Status: AC
  Administered 2022-05-23: 40 mg via INTRAVENOUS
  Filled 2022-05-23: qty 4

## 2022-05-23 MED ORDER — ONDANSETRON HCL 4 MG PO TABS: 4.0000 mg | ORAL_TABLET | Freq: Four times a day (QID) | ORAL | Status: DC | PRN

## 2022-05-23 MED ORDER — ASPIRIN 81 MG PO CHEW
324.0000 mg | CHEWABLE_TABLET | Freq: Once | ORAL | Status: AC
  Administered 2022-05-23: 324 mg via ORAL
  Filled 2022-05-23: qty 4

## 2022-05-23 MED ORDER — CHLORHEXIDINE GLUCONATE CLOTH 2 % EX PADS
6.0000 | MEDICATED_PAD | Freq: Every day | CUTANEOUS | Status: DC
  Administered 2022-05-26: 6 via TOPICAL
  Filled 2022-05-23 (×2): qty 6

## 2022-05-23 MED ORDER — PANTOPRAZOLE SODIUM 20 MG PO TBEC
20.0000 mg | DELAYED_RELEASE_TABLET | Freq: Every day | ORAL | Status: DC
  Administered 2022-05-23 – 2022-05-28 (×4): 20 mg via ORAL
  Filled 2022-05-23 (×6): qty 1

## 2022-05-23 MED ORDER — CARVEDILOL 25 MG PO TABS
25.0000 mg | ORAL_TABLET | Freq: Two times a day (BID) | ORAL | Status: DC
  Administered 2022-05-23 – 2022-05-28 (×9): 25 mg via ORAL
  Filled 2022-05-23: qty 1
  Filled 2022-05-23: qty 4
  Filled 2022-05-23 (×7): qty 1

## 2022-05-23 NOTE — ED Notes (Signed)
RN walked into Pt's room d/t monitor alarming. Pt looked diaphoretic and unwell. Pt denied chest pain and radiation.

## 2022-05-23 NOTE — ED Notes (Addendum)
BP responsive to Cardene drip and drops into 115/60s - paused infusion and gave patient home dose BP meds.   Notified Dr. Arbutus Ped that cardene is paused at this time - [place] per Dr. Arbutus Ped.

## 2022-05-23 NOTE — Assessment & Plan Note (Addendum)
-   This could be the culprit for his acute pulmonary edema - We will continue on IV Cardene drip. - We will continue his antihypertensives.

## 2022-05-23 NOTE — Discharge Planning (Addendum)
[place]  [address], [place] [address] [phone]  Scheduled Days Tuesday Thursday and Saturday   Treatment Time: 11:00am  Met with patient today via interpreter. Patient discussed concerns about a decrease in urine output. Suggested to patient to speak with Nephrology about this concern. Provided education on fluid intake and suggested patient follow up with dietician at home clinic. Informed Environmental health practitioner at clinic about these concerns as well.

## 2022-05-23 NOTE — Progress Notes (Signed)
Patient HD start w/ no complications, interpreter present for RN assessment and consent, catheter lines reversed.

## 2022-05-23 NOTE — Assessment & Plan Note (Signed)
-   We will continue statin therapy. 

## 2022-05-23 NOTE — Progress Notes (Signed)
ANTICOAGULATION CONSULT NOTE - Initial Consult  Pharmacy Consult for Heparin  Indication: chest pain/ACS  No Known Allergies  Patient Measurements: Height: 5\' 6"  (167.6 cm) Weight: 67 kg (147 lb 11.3 oz) IBW/kg (Calculated) : 63.8 Heparin Dosing Weight: 67 kg   Vital Signs: Temp: 98.6 F (37 C) (12/04 2331) Temp Source: Oral (12/04 2331) BP: 147/71 (12/05 0248) Pulse Rate: 91 (12/05 0200)  Labs: Recent Labs    05/22/22 2338 05/23/22 0209  HGB 9.8*  --   HCT 28.4*  --   PLT 198  --   CREATININE 5.61*  --   TROPONINIHS 478* 462*    Estimated Creatinine Clearance: 12.8 mL/min (A) (by C-G formula based on SCr of 5.61 mg/dL (H)).   Medical History: Past Medical History:  Diagnosis Date   Anemia    Anxiety and depression    ATN (acute tubular necrosis) (HCC)    CAD (coronary artery disease)    a.) R/LHC 07/19/2020: 20% pRCA, 30% mRCA, 50% dRCA, 50% RPAV, 40% pLAD, 70% D1, 60% mLAD; LVEDP 19 mmHg; PA 37/16 (26 mmHg); PCWP 13 mmHg. b.) LHC 06/03/2021: EF 45-50%; LVEDP 18 mmHg; 40% pLAD, 60% mLAD, 20% pRCA, 30% mRCA, 70% D1, 30% dRCA, 40% RPDA.   Chronic HFrEF (heart failure with reduced ejection fraction) ([place])    a.) 06/2020 Echo: EF 30-35%, glob HK, GrII DD, nl RV fxn. Mild LAE. b.)  TTE 08/08/2021: EF 40-45%; global HK; mild LVH; GLS -8.6; mild LA dilation.   DVT of axillary vein, acute left ([place]) 05/2021   a.) Tx'd with apixaban   ESRD (end stage renal disease) on dialysis [facility])    a.) M-W-F   History of 2019 novel coronavirus disease (COVID-19) 03/06/2020   a.) s/p Tx with monoclonal Ab infusion   HLD (hyperlipidemia)    Hyperkalemia 12/23/2020   Hypertension    Left carotid artery stenosis 08/18/2021   a.) Carotid Doppler 16/12/3708: 62-69% LICA stenosis   Long term current use of anticoagulant    a.) apixaban for DVT   NICM (nonischemic cardiomyopathy) ([place])    a.) 07/2009 MV: EF 48%; b.) 08/2019 Echo: EF 45-50%. Global HK. Mod LVH. GrIDD; c.) 06/2020 Echo: EF  30-35%, glob HK, mild LVH, GrII DD, nl RV fxn, mild LAE; d.) 06/2020 Cath: nonobs dzs. e.)  LHC 06/03/2021; EF 45-50%; LVEDP 18 mmHg; nonobstructive CAD. f.) b.)  TTE 08/08/2021: EF 40-45%; global HK; mild LVH; GLS -8.6; mild LA dilation.   NSTEMI (non-ST elevated myocardial infarction) ([place]) 07/15/2020   a.) hsTnI trended: 124--> 127--> 176 --> 190 --> 200 ng/L.   Sepsis ([place])    Suicidal ideations    TIA (transient ischemic attack) 06/2021   Type 2 diabetes mellitus treated with insulin (HCC)     Medications:  (Not in a hospital admission)   Assessment: Pharmacy consulted to dose heparin for ACS/NSTEMI in this 61 year old male admitted with HTN urgency.   Pt was on Eliquis 5 mg PO BID PTA for DVT prophylaxis , last dose on 12/4 at unspecified time.  CrCl = 12.8 ml/min   Goal of Therapy:  Heparin level 0.3-0.7 units/ml aPTT :  66 - 102  Monitor platelets by anticoagulation protocol: Yes   Plan:  No bolus b/c of recent use of Eliquis on 12/4. Will start heparin gtt at 800 units/hr on 12/5 @ 0300. Will use aPTT to guide dosing until HL and aPTT correlate. Will draw aPTT and HL 8 hrs after start of drip. Will  check CBC daily.   Andrae Claunch D 05/23/2022,2:55 AM

## 2022-05-23 NOTE — H&P (Addendum)
[place]   PATIENT NAME: Jimmy Olson    MR#:  [id number]  DATE OF BIRTH:  1961/03/24  DATE OF ADMISSION:  05/22/2022  PRIMARY CARE PHYSICIAN: [facility], [place]   Patient is coming from: Home  REQUESTING/REFERRING PHYSICIAN: Ward, Delice Bison, DO  CHIEF COMPLAINT:   Chief Complaint  Patient presents with   Shortness of Breath    HISTORY OF PRESENT ILLNESS:  Jimmy Olson is a 61 y.o. male with medical history significant for coronary artery disease, chronic HFrEF, anxiety and depression, DVT on Eliquis, ESRD on HD on TTS and essential hypertension, presented to the emergency room with acute onset of worsening dyspnea with associated dry cough and wheezing that started yesterday.  He admitted to a strong chest pain graded 3/10 in severity that felt like squeezing with associated radiation to his left arm.  He had vomiting once but denied any associated diaphoresis.  No fever or chills.  No worsening lower extremity edema.  He admitted to two-pillow orthopnea and dyspnea on exertion though.  He has not missed any hemodialysis session recently.  No dysuria, hematuria or flank pain.  ED Course: When he came to the ER BP was 217/105 with respiratory rate of 23 and otherwise unremarkable vital signs.  Labs revealed a blood glucose of 294 with a BUN of 51 and creatinine 5.61 with calcium of 8.8.  BNP was 3889.8 and high sensitive troponin I 478.  CBC showed anemia close to baseline.  Coagulation profile was within normal. EKG as reviewed by me : EKG G showed normal sinus rhythm with a rate of 86 with minimal voltage criteria for LVH and prolonged QT interval with QTc of 483 MS. Imaging: Portable chest x-ray showed progressive interstitial pulmonary edema, mild to moderate in severity.  The patient was given 40 mg of IV Lasix, Tussionex, 10 mg of IV Hydralazine, 40 aspirin and started on IV Cardene drip.  He will be admitted to a stepdown unit bed for further  evaluation and management. PAST MEDICAL HISTORY:   Past Medical History:  Diagnosis Date   Anemia    Anxiety and depression    ATN (acute tubular necrosis) (HCC)    CAD (coronary artery disease)    a.) R/LHC 07/19/2020: 20% pRCA, 30% mRCA, 50% dRCA, 50% RPAV, 40% pLAD, 70% D1, 60% mLAD; LVEDP 19 mmHg; PA 37/16 (26 mmHg); PCWP 13 mmHg. b.) LHC 06/03/2021: EF 45-50%; LVEDP 18 mmHg; 40% pLAD, 60% mLAD, 20% pRCA, 30% mRCA, 70% D1, 30% dRCA, 40% RPDA.   Chronic HFrEF (heart failure with reduced ejection fraction) ([place])    a.) 06/2020 Echo: EF 30-35%, glob HK, GrII DD, nl RV fxn. Mild LAE. b.)  TTE 08/08/2021: EF 40-45%; global HK; mild LVH; GLS -8.6; mild LA dilation.   DVT of axillary vein, acute left ([place]) 05/2021   a.) Tx'd with apixaban   ESRD (end stage renal disease) on dialysis [facility])    a.) M-W-F   History of 2019 novel coronavirus disease (COVID-19) 03/06/2020   a.) s/p Tx with monoclonal Ab infusion   HLD (hyperlipidemia)    Hyperkalemia 12/23/2020   Hypertension    Left carotid artery stenosis 08/18/2021   a.) Carotid Doppler 75/03/2584: 27-78% LICA stenosis   Long term current use of anticoagulant    a.) apixaban for DVT   NICM (nonischemic cardiomyopathy) ([place])    a.) 07/2009 MV: EF 48%; b.) 08/2019 Echo: EF 45-50%. Global HK. Mod LVH. GrIDD; c.)  06/2020 Echo: EF 30-35%, glob HK, mild LVH, GrII DD, nl RV fxn, mild LAE; d.) 06/2020 Cath: nonobs dzs. e.)  LHC 06/03/2021; EF 45-50%; LVEDP 18 mmHg; nonobstructive CAD. f.) b.)  TTE 08/08/2021: EF 40-45%; global HK; mild LVH; GLS -8.6; mild LA dilation.   NSTEMI (non-ST elevated myocardial infarction) ([place]) 07/15/2020   a.) hsTnI trended: 124--> 127--> 176 --> 190 --> 200 ng/L.   Sepsis ([place])    Suicidal ideations    TIA (transient ischemic attack) 06/2021   Type 2 diabetes mellitus treated with insulin ([place])     PAST SURGICAL HISTORY:   Past Surgical History:  Procedure Laterality Date   APPENDECTOMY     AV FISTULA PLACEMENT Left  09/29/2021   Procedure: ARTERIOVENOUS (AV) FISTULA CREATION ( RADIAL CEPHALIC);  Surgeon: Algernon Huxley, MD;  Location: [facility] ORS;  Service: Vascular;  Laterality: Left;   CARDIAC CATHETERIZATION     DIALYSIS/PERMA CATHETER INSERTION N/A 06/02/2021   Procedure: DIALYSIS/PERMA CATHETER INSERTION;  Surgeon: Algernon Huxley, MD;  Location: [place] [facility];  Service: Cardiovascular;  Laterality: N/A;   LEFT HEART CATH AND CORONARY ANGIOGRAPHY N/A 06/03/2021   Procedure: LEFT HEART CATH AND CORONARY ANGIOGRAPHY;  Surgeon: Wellington Hampshire, MD;  Location: [place] [facility];  Service: Cardiovascular;  Laterality: N/A;   LIGATION OF ARTERIOVENOUS  FISTULA Left 10/02/2021   Procedure: LIGATION OF ARTERIOVENOUS  FISTULA;  Surgeon: Evaristo Bury, MD;  Location: [facility] ORS;  Service: Vascular;  Laterality: Left;   NASAL SINUS SURGERY     RIGHT/LEFT HEART CATH AND CORONARY ANGIOGRAPHY N/A 07/19/2020   Procedure: RIGHT/LEFT HEART CATH AND CORONARY ANGIOGRAPHY;  Surgeon: Wellington Hampshire, MD;  Location:  [place] [facility];  Service: Cardiovascular;  Laterality: N/A;    SOCIAL HISTORY:   Social History   Tobacco Use   Smoking status: Never   Smokeless tobacco: Never  Substance Use Topics   Alcohol use: Not Currently    FAMILY HISTORY:   Family History  Problem Relation Age of Onset   Kidney failure Mother        died @ 5   Heart failure Mother    Other Father        he never knew his father   Diabetes Brother     DRUG ALLERGIES:  No Known Allergies  REVIEW OF SYSTEMS:   ROS As per history of present illness. All pertinent systems were reviewed above. Constitutional, HEENT, cardiovascular, respiratory, GI, GU, musculoskeletal, neuro, psychiatric, endocrine, integumentary and hematologic systems were reviewed and are otherwise negative/unremarkable except for positive findings mentioned above in the HPI.   MEDICATIONS AT HOME:   Prior to Admission medications   Medication Sig  Start Date End Date Taking? Authorizing Provider  apixaban (ELIQUIS) 5 MG TABS tablet TAKE 1 TABLET BY MOUTH TWICE DAILY. 05/19/22  Yes Sreenath, Sudheer B, MD  carvedilol (COREG) 25 MG tablet Take 1 tablet (25 mg total) by mouth 2 (two) times daily. 11/29/21  Yes Hackney, Otila Kluver A, FNP  escitalopram (LEXAPRO) 10 MG tablet Take 1 tablet (10 mg total) by mouth at bedtime. 05/19/22 06/18/22 Yes Sreenath, Sudheer B, MD  hydrALAZINE (APRESOLINE) 100 MG tablet Take 1 tablet (100 mg total) by mouth 2 (two) times daily. 05/19/22 06/18/22 Yes Sreenath, Sudheer B, MD  insulin detemir (LEVEMIR FLEXPEN) 100 UNIT/ML FlexPen Inject 20 Units into the skin once daily at bedtime. Patient taking differently: Inject 18 Units into the skin at bedtime. 10/10/21 05/22/22 Yes  Insulin Pen Needle 32G X 4 MM MISC USE AS DIRECTED 06/07/21  Yes Sreenath, Sudheer B, MD  isosorbide mononitrate (IMDUR) 30 MG 24 hr tablet Take 1 tablet (30 mg total) by mouth in the morning and at bedtime. 11/29/21  Yes Hackney, Otila Kluver A, FNP  losartan (COZAAR) 100 MG tablet Take 1 tablet (100 mg total) by mouth daily. 05/19/22 06/18/22 Yes Sreenath, Sudheer B, MD  NOVOLOG FLEXPEN 100 UNIT/ML FlexPen Inject 0-15 Units into the skin 3 (three) times daily with meals. 0-15 units tid 0-5 units qhs 12/28/21  Yes [provider]  Nutritional Supplements (FEEDING SUPPLEMENT, NEPRO CARB STEADY,) LIQD Take 237 mLs by mouth 3 (three) times daily between meals. 10/08/21  Yes Nicole Kindred A, DO  pantoprazole (PROTONIX) 20 MG tablet Take 1 tablet (20 mg total) by mouth daily. 05/19/22 06/18/22 Yes Sreenath, Sudheer B, MD  rosuvastatin (CRESTOR) 40 MG tablet Take 1 tablet (40 mg total) by mouth daily. 05/19/22 06/18/22 Yes Sreenath, Sudheer B, MD  torsemide (DEMADEX) 20 MG tablet Take 1 tablet (20 mg total) by mouth daily. Takes on Sat and Sun 05/19/22 08/11/22 Yes Sreenath, Sudheer B, MD  acetaminophen (TYLENOL) 325 MG tablet Take 650 mg by mouth every 6 (six)  hours as needed.    [provider]  albuterol (PROVENTIL HFA) 108 (90 Base) MCG/ACT inhaler INHALE 2 PUFFS INTO THE LUNGS ONCE EVERY 6 HOURS AS NEEDED FOR WHEEZING OR SHORTNESS OF BREATH. 07/13/21     nitroGLYCERIN (NITROSTAT) 0.4 MG SL tablet Place 1 tablet (0.4 mg total) under the tongue every 5 (five) minutes as needed for chest pain. Patient not taking: Reported on 05/22/2022 05/19/22   Sidney Ace, MD  gabapentin (NEURONTIN) 300 MG capsule Take 1 capsule (300 mg total) by mouth at bedtime for 7 days. 06/07/21 08/18/21  Sidney Ace, MD  isosorbide dinitrate (ISORDIL) 30 MG tablet TAKE 1 TABLET BY MOUTH ONCE DAILY. 07/12/21 07/13/21        VITAL SIGNS:  Blood pressure (!) 150/71, pulse 86, temperature 98.3 F (36.8 C), temperature source Oral, resp. rate 19, height 5\' 6"  (1.676 m), weight 67 kg, SpO2 94 %.  PHYSICAL EXAMINATION:  Physical Exam  GENERAL:  61 y.o.-year-old male patient semi-lying in the bed with mild respiratory distress with conversational dyspnea. EYES: Pupils equal, round, reactive to light and accommodation. No scleral icterus. Extraocular muscles intact.  HEENT: Head atraumatic, normocephalic. Oropharynx and nasopharynx clear.  NECK:  Supple, no jugular venous distention. No thyroid enlargement, no tenderness.  LUNGS: Diminished bibasilar and midlung zone breath sounds with associated rales.  No use of accessory muscles of respiration.  CARDIOVASCULAR: Regular rate and rhythm, S1, S2 normal. No murmurs, rubs, or gallops.  ABDOMEN: Soft, nondistended, nontender. Bowel sounds present. No organomegaly or mass.  EXTREMITIES: No pedal edema, cyanosis, or clubbing.  NEUROLOGIC: Cranial nerves II through XII are intact. Muscle strength 5/5 in all extremities. Sensation intact. Gait not checked.  PSYCHIATRIC: The patient is alert and oriented x 3.  Normal affect and good eye contact. SKIN: No obvious rash, lesion, or ulcer.   LABORATORY PANEL:    CBC Recent Labs  Lab 05/22/22 2338  WBC 5.3  HGB 9.8*  HCT 28.4*  PLT 198   ------------------------------------------------------------------------------------------------------------------  Chemistries  Recent Labs  Lab 05/22/22 2338  NA 139  K 4.5  CL 105  CO2 25  GLUCOSE 294*  BUN 51*  CREATININE 5.61*  CALCIUM 8.8*  MG 2.1   ------------------------------------------------------------------------------------------------------------------  Cardiac  Enzymes No results for input(s): "TROPONINI" in the last 168 hours. ------------------------------------------------------------------------------------------------------------------  RADIOLOGY:  DG Chest Port 1 View  Result Date: 05/23/2022 CLINICAL DATA:  Dyspnea EXAM: PORTABLE CHEST 1 VIEW COMPARISON:  05/17/2022 FINDINGS: The lungs are symmetrically well expanded. No pneumothorax or pleural effusion. Mild cardiomegaly is stable. Diffuse interstitial pulmonary edema has progressed slightly in the interval and is now mild-to-moderate in severity. Right internal jugular hemodialysis catheter tip again noted at the superior cavoatrial junction. No acute bone abnormality. IMPRESSION: 1. Progressive interstitial pulmonary edema, now mild-to-moderate in severity. Electronically Signed   By: Fidela Salisbury M.D.   On: 05/23/2022 00:10      IMPRESSION AND PLAN:  Assessment and Plan: * Acute on chronic combined systolic and diastolic CHF (congestive heart failure) ([place]) -The patient has subsequent acute hypoxic respiratory failure. - The patient will be admitted to a stepdown unit bed. - He will be diuresed with IV Lasix. - Nephrology consult to be obtained for hemodialysis. - Cardiology consult to be obtained especially given elevated troponin I could be related to his acute CHF as well as non-STEMI. - We will follow serial troponins. - We will follow I's and O's and daily weights.  Hypertensive urgency - This could be  the culprit for his acute pulmonary edema - We will continue on IV Cardene drip. - We will continue his antihypertensives.  Non-STEMI (non-ST elevated myocardial infarction) ([place]) - He will be placed on IV heparin. - We will continue beta-blocker therapy with Coreg, ARB as well as Imdur and high dose statin therapy. - We will check fasting lipids. - He will be on aspirin as well as as needed sublingual nitroglycerin and IV morphine sulfate for pain. - Most recent 2D echo revealed an EF of 40 to 45% with grade 2 diastolic dysfunction on 09/98/3382. - Cardiology consult will be obtained. - I notified Dr. Acie Fredrickson about the patient.  End-stage renal disease on hemodialysis [facility]) - Nephrology consult to be obtained for hemodialysis.  Dyslipidemia - We will continue statin therapy.  GERD without esophagitis - We will continue PPI therapy    DVT prophylaxis: IV heparin Advanced Care Planning:  Code Status: full code. Family Communication:  The plan of care was discussed in details with the patient (and family). I answered all questions. The patient agreed to proceed with the above mentioned plan. Further management will depend upon hospital course. Disposition Plan: Back to previous home environment Consults called: Cardiology and nephrology All the records are reviewed and case discussed with ED provider.  Status is: Inpatient  At the time of the admission, it appears that the appropriate admission status for this patient is inpatient.  This is judged to be reasonable and necessary in order to provide the required intensity of service to ensure the patient's safety given the presenting symptoms, physical exam findings and initial radiographic and laboratory data in the context of comorbid conditions.  The patient requires inpatient status due to high intensity of service, high risk of further deterioration and high frequency of surveillance required.  I certify that at the time of  admission, it is my clinical judgment that the patient will require inpatient hospital care extending more than 2 midnights.                            Dispo: The patient is from: Home              Anticipated d/c is  to: Home              Patient currently is not medically stable to d/c.              Difficult to place patient: No Authorized and performed by: Eugenie Norrie, MD Total critical care time: Approximately   55    minutes. Due to a high probability of clinically significant, life-threatening deterioration, the patient required my highest level of preparedness to intervene emergently and I personally spent this critical care time directly and personally managing the patient.  This critical care time included obtaining a history, examining the patient, pulse oximetry, ordering and review of studies, arranging urgent treatment with development of management plan, evaluation of patient's response to treatment, frequent reassessment, and discussions with other providers. This critical care time was performed to assess and manage the high probability of imminent, life-threatening deterioration that could result in multiorgan failure.  It was exclusive of separately billable procedures and treating other patients and teaching time.   Christel Mormon M.D on 05/23/2022 at 4:01 AM  [facility]   From 7 PM-7 AM, contact night-coverage [email or web address]  CC: Primary care physician; [facility], [place]

## 2022-05-23 NOTE — Progress Notes (Signed)
ANTICOAGULATION CONSULT NOTE - Initial Consult  Pharmacy Consult for Heparin  Indication: chest pain/ACS  No Known Allergies  Patient Measurements: Height: 5\' 6"  (167.6 cm) Weight: 78.9 kg (173 lb 15.1 oz) IBW/kg (Calculated) : 63.8 Heparin Dosing Weight: 67 kg   Vital Signs: Temp: 97.9 F (36.6 C) (12/05 1521) Temp Source: Oral (12/05 1521) BP: 157/87 (12/05 1521) Pulse Rate: 73 (12/05 1521)  Labs: Recent Labs    05/22/22 2338 05/23/22 0209 05/23/22 0310 05/23/22 0601 05/23/22 1445  HGB 9.8*  --   --  9.1*  --   HCT 28.4*  --   --  27.1*  --   PLT 198  --   --  174  --   APTT  --   --  29  --  68*  LABPROT  --   --  14.2  --   --   INR  --   --  1.1  --   --   HEPARINUNFRC  --   --  >1.10*  --  >1.10*  CREATININE 5.61*  --   --  5.68*  --   TROPONINIHS 478* 462*  --   --   --      Estimated Creatinine Clearance: 13.8 mL/min (A) (by C-G formula based on SCr of 5.68 mg/dL (H)).   Medical History: Past Medical History:  Diagnosis Date   Anemia    Anxiety and depression    ATN (acute tubular necrosis) (HCC)    CAD (coronary artery disease)    a.) R/LHC 07/19/2020: 20% pRCA, 30% mRCA, 50% dRCA, 50% RPAV, 40% pLAD, 70% D1, 60% mLAD; LVEDP 19 mmHg; PA 37/16 (26 mmHg); PCWP 13 mmHg. b.) LHC 06/03/2021: EF 45-50%; LVEDP 18 mmHg; 40% pLAD, 60% mLAD, 20% pRCA, 30% mRCA, 70% D1, 30% dRCA, 40% RPDA.   Chronic HFrEF (heart failure with reduced ejection fraction) ([place])    a.) 06/2020 Echo: EF 30-35%, glob HK, GrII DD, nl RV fxn. Mild LAE. b.)  TTE 08/08/2021: EF 40-45%; global HK; mild LVH; GLS -8.6; mild LA dilation.   DVT of axillary vein, acute left ([place]) 05/2021   a.) Tx'd with apixaban   ESRD (end stage renal disease) on dialysis [facility])    a.) M-W-F   History of 2019 novel coronavirus disease (COVID-19) 03/06/2020   a.) s/p Tx with monoclonal Ab infusion   HLD (hyperlipidemia)    Hyperkalemia 12/23/2020   Hypertension    Left carotid artery stenosis 08/18/2021    a.) Carotid Doppler 01/75/1025: 85-27% LICA stenosis   Long term current use of anticoagulant    a.) apixaban for DVT   NICM (nonischemic cardiomyopathy) ([place])    a.) 07/2009 MV: EF 48%; b.) 08/2019 Echo: EF 45-50%. Global HK. Mod LVH. GrIDD; c.) 06/2020 Echo: EF 30-35%, glob HK, mild LVH, GrII DD, nl RV fxn, mild LAE; d.) 06/2020 Cath: nonobs dzs. e.)  LHC 06/03/2021; EF 45-50%; LVEDP 18 mmHg; nonobstructive CAD. f.) b.)  TTE 08/08/2021: EF 40-45%; global HK; mild LVH; GLS -8.6; mild LA dilation.   NSTEMI (non-ST elevated myocardial infarction) ([place]) 07/15/2020   a.) hsTnI trended: 124--> 127--> 176 --> 190 --> 200 ng/L.   Sepsis ([place])    Suicidal ideations    TIA (transient ischemic attack) 06/2021   Type 2 diabetes mellitus treated with insulin (HCC)     Medications:  (Not in a hospital admission)  Assessment: Pharmacy consulted to dose heparin for ACS/NSTEMI in this 61 year old male admitted with HTN  urgency.   Pt was on Eliquis 5 mg PO BID PTA for DVT prophylaxis , last dose on 12/4 at unspecified time.  CrCl = 12.8 ml/min   Date Time aPTT/HL Rate/Comment 12/05 1445 68s / >1.1 Therapeutic x1; 800 un/hr      Goal of Therapy:  Heparin level 0.3-0.7 units/ml aPTT :  66 - 102  Monitor platelets by anticoagulation protocol: Yes   Plan:  aPTT LLN therapeutic range; HL not correlating still showing significant DOAC interference with level >1.1. Continue to titrate by aPTT Continue heparin gtt at 800 units/hr. Get confirmatory aPTT in 8hrs to assess rate Continue to titrate by aPTT to guide dosing until HL and aPTT correlate. Will check CBC daily.   Shanon Brow Meril Dray 05/23/2022,3:47 PM

## 2022-05-23 NOTE — Assessment & Plan Note (Signed)
-   We will continue PPI therapy 

## 2022-05-23 NOTE — Progress Notes (Signed)
Pt completed 3.5 hour HD treatment w/ no complications. Interpreter utilized for [employer] and consent. Pt alert, stable, no c/o, report to ED RN. Start: 1000 End: 1330 2015ml fluid removed 84L BVP 77.5kg post hd bed weight No HD meds ordered

## 2022-05-23 NOTE — ED Notes (Signed)
Pt did not eat dinner meal

## 2022-05-23 NOTE — ED Notes (Signed)
Meds given.   

## 2022-05-23 NOTE — Assessment & Plan Note (Addendum)
-   He will be placed on IV heparin. - We will continue beta-blocker therapy with Coreg, ARB as well as Imdur and high dose statin therapy. - We will check fasting lipids. - He will be on aspirin as well as as needed sublingual nitroglycerin and IV morphine sulfate for pain. - Most recent 2D echo revealed an EF of 40 to 45% with grade 2 diastolic dysfunction on 74/93/5521. - Cardiology consult will be obtained. - I notified Dr. Acie Fredrickson about the patient.

## 2022-05-23 NOTE — Progress Notes (Signed)
Pre hd rn assessment 

## 2022-05-23 NOTE — Assessment & Plan Note (Signed)
-   Nephrology consult to be obtained for hemodialysis.

## 2022-05-23 NOTE — Progress Notes (Signed)
Transport delay d/t patient on heparin drip. ED RN assisting w/ transport.

## 2022-05-23 NOTE — ED Notes (Signed)
Resumed care from natalie rn.  Pt alert  meds infusing.    Wife with pt.  Nsr on moiitor

## 2022-05-23 NOTE — Progress Notes (Signed)
[facility]  ROUNDING NOTE   Subjective:   Jimmy Olson  is a 61 year old Hispanic male with past medical conditions including anemia, two-vessel CAD, diabetes, depression, DVT with Eliquis, and end-stage renal disease on hemodialysis.  Patient presents to the emergency department with complaints of shortness of breath.  He has been admitted for Hypertensive urgency [I16.0] Flash pulmonary edema ([place]) [J81.0]  Patient is known to our practice and receives outpatient dialysis treatments at [facility] on a TTS schedule, supervised by Dr. Candiss Norse. Last treatment received on Saturday. Tolerated treatment well. Patient presents to ED with shortness of breath and states he was also have some chest pain discomfort. States this started yesterday. Also complained of cough.   Patient seen and evaluated during dialysis   HEMODIALYSIS FLOWSHEET:  Blood Flow Rate (mL/min): 400 mL/min Arterial Pressure (mmHg): -150 mmHg Venous Pressure (mmHg): 200 mmHg TMP (mmHg): 8 mmHg Ultrafiltration Rate (mL/min): 736 mL/min Dialysate Flow Rate (mL/min): 300 ml/min  States his breathing has improved since ED arrival however he room continues to feel bad.  Mild lower extremity edema.  Objective:  Vital signs in last 24 hours:  Temp:  [97.6 F (36.4 C)-98.6 F (37 C)] 98.1 F (36.7 C) (12/05 1416) Pulse Rate:  [69-92] 76 (12/05 1415) Resp:  [10-25] 18 (12/05 1415) BP: (93-217)/(63-106) 154/90 (12/05 1415) SpO2:  [89 %-100 %] 99 % (12/05 1415) Weight:  [67 kg-78.9 kg] 78.9 kg (12/05 0955)  Weight change:  Filed Weights   05/22/22 2332 05/23/22 0955  Weight: 67 kg 78.9 kg    Intake/Output: I/O last 3 completed shifts: In: 198.4 [I.V.:198.4] Out: -    Intake/Output this shift:  Total I/O In: -  Out: 2350 [Urine:350; Other:2000]  Physical Exam: General: NAD  Head: Normocephalic, atraumatic. Moist oral mucosal membranes  Eyes: Anicteric  Lungs:  Clear to auscultation,  normal effort, room air  Heart: Regular rate and rhythm  Abdomen:  Soft, nontender  Extremities: Trace peripheral edema.  Neurologic: Nonfocal, moving all four extremities  Skin: No lesions  Access: Right chest PermCath    Basic Metabolic Panel: Recent Labs  Lab 05/17/22 2023 05/18/22 0850 05/19/22 0218 05/22/22 2338 05/23/22 0601  NA 137 140 135 139 138  K 4.6 3.6 3.5 4.5 3.7  CL 104 107 99 105 106  CO2 24 25 27 25 23   GLUCOSE 399* 137* 111* 294* 395*  BUN 51* 52* 29* 51* 58*  CREATININE 4.72* 4.87* 3.75* 5.61* 5.68*  CALCIUM 9.2 8.4* 8.6* 8.8* 8.3*  MG  --   --   --  2.1  --   PHOS  --  4.6  --   --   --      Liver Function Tests: Recent Labs  Lab 05/18/22 0850  ALBUMIN 3.1*    No results for input(s): "LIPASE", "AMYLASE" in the last 168 hours. No results for input(s): "AMMONIA" in the last 168 hours.  CBC: Recent Labs  Lab 05/17/22 2144 05/18/22 0850 05/19/22 0218 05/22/22 2338 05/23/22 0601  WBC 6.4 4.9 4.6 5.3 5.6  NEUTROABS 4.4 3.0  --   --   --   HGB 10.9* 9.5* 10.6* 9.8* 9.1*  HCT 31.1* 27.6* 31.2* 28.4* 27.1*  MCV 88.6 90.5 89.9 91.6 91.2  PLT 190 170 167 198 174     Cardiac Enzymes: No results for input(s): "CKTOTAL", "CKMB", "CKMBINDEX", "TROPONINI" in the last 168 hours.  BNP: Invalid input(s): "POCBNP"  CBG: Recent Labs  Lab 05/19/22 0739 05/23/22  0244 05/23/22 0422 05/23/22 0805 05/23/22 1411  GLUCAP 261* 390* 388* 354* 160*     Microbiology: Results for orders placed or performed during the hospital encounter of 05/22/22  Resp Panel by RT-PCR (Flu A&B, Covid) Anterior Nasal Swab     Status: None   Collection Time: 05/22/22 11:58 PM   Specimen: Anterior Nasal Swab  Result Value Ref Range Status   SARS Coronavirus 2 by RT PCR NEGATIVE NEGATIVE Final    Comment: (NOTE) SARS-CoV-2 target nucleic acids are NOT DETECTED.  The SARS-CoV-2 RNA is generally detectable in upper respiratory specimens during the acute phase of  infection. The lowest concentration of SARS-CoV-2 viral copies this assay can detect is 138 copies/mL. A negative result does not preclude SARS-Cov-2 infection and should not be used as the sole basis for treatment or other patient management decisions. A negative result may occur with  improper specimen collection/handling, submission of specimen other than nasopharyngeal swab, presence of viral mutation(s) within the areas targeted by this assay, and inadequate number of viral copies(<138 copies/mL). A negative result must be combined with clinical observations, patient history, and epidemiological information. The expected result is Negative.  Fact Sheet for Patients:  EntrepreneurPulse.com.au  Fact Sheet for Healthcare Providers:  IncredibleEmployment.be  This test is no t yet approved or cleared by the [place] FDA and  has been authorized for detection and/or diagnosis of SARS-CoV-2 by FDA under an Emergency Use Authorization (EUA). This EUA will remain  in effect (meaning this test can be used) for the duration of the COVID-19 declaration under Section 564(b)(1) of the Act, 21 U.S.C.section 360bbb-3(b)(1), unless the authorization is terminated  or revoked sooner.       Influenza A by PCR NEGATIVE NEGATIVE Final   Influenza B by PCR NEGATIVE NEGATIVE Final    Comment: (NOTE) The Xpert Xpress SARS-CoV-2/FLU/RSV plus assay is intended as an aid in the diagnosis of influenza from Nasopharyngeal swab specimens and should not be used as a sole basis for treatment. Nasal washings and aspirates are unacceptable for Xpert Xpress SARS-CoV-2/FLU/RSV testing.  Fact Sheet for Patients: EntrepreneurPulse.com.au  Fact Sheet for Healthcare Providers: IncredibleEmployment.be  This test is not yet approved or cleared by the [place] FDA and has been authorized for detection and/or diagnosis of SARS-CoV-2  by FDA under an Emergency Use Authorization (EUA). This EUA will remain in effect (meaning this test can be used) for the duration of the COVID-19 declaration under Section 564(b)(1) of the Act, 21 U.S.C. section 360bbb-3(b)(1), unless the authorization is terminated or revoked.  Performed at [facility], [address] [place], [place] [address]     Coagulation Studies: Recent Labs    05/23/22 0310  LABPROT 14.2  INR 1.1    Urinalysis: No results for input(s): "COLORURINE", "LABSPEC", "PHURINE", "GLUCOSEU", "HGBUR", "BILIRUBINUR", "KETONESUR", "PROTEINUR", "UROBILINOGEN", "NITRITE", "LEUKOCYTESUR" in the last 72 hours.  Invalid input(s): "APPERANCEUR"    Imaging: DG Chest Port 1 View  Result Date: 05/23/2022 CLINICAL DATA:  Dyspnea EXAM: PORTABLE CHEST 1 VIEW COMPARISON:  05/17/2022 FINDINGS: The lungs are symmetrically well expanded. No pneumothorax or pleural effusion. Mild cardiomegaly is stable. Diffuse interstitial pulmonary edema has progressed slightly in the interval and is now mild-to-moderate in severity. Right internal jugular hemodialysis catheter tip again noted at the superior cavoatrial junction. No acute bone abnormality. IMPRESSION: 1. Progressive interstitial pulmonary edema, now mild-to-moderate in severity. Electronically Signed   By: Fidela Salisbury M.D.   On: 05/23/2022 00:10  Medications:    anticoagulant sodium citrate     heparin 800 Units/hr (05/23/22 0723)    carvedilol  25 mg Oral BID WC   Chlorhexidine Gluconate Cloth  6 each Topical Q0600   escitalopram  10 mg Oral QHS   furosemide  60 mg Intravenous BID WC   hydrALAZINE  100 mg Oral BID   insulin aspart  0-20 Units Subcutaneous TID WC   insulin aspart  0-5 Units Subcutaneous QHS   insulin aspart  3 Units Subcutaneous TID WC   insulin detemir  10 Units Subcutaneous Once   insulin detemir  18 Units Subcutaneous QHS   isosorbide mononitrate  30 mg Oral Daily   losartan  100 mg  Oral Daily   pantoprazole  20 mg Oral Daily   rosuvastatin  40 mg Oral Daily   acetaminophen **OR** acetaminophen, albuterol, alteplase, anticoagulant sodium citrate, heparin, lidocaine (PF), lidocaine-prilocaine, magnesium hydroxide, nitroGLYCERIN, ondansetron **OR** ondansetron (ZOFRAN) IV, pentafluoroprop-tetrafluoroeth, traZODone  Assessment/ Plan:  Mr. Jimmy Olson is a 61 y.o.  male with past medical conditions including anemia, two-vessel CAD, diabetes, depression, DVT with Eliquis, and end-stage renal disease on hemodialysis.  Patient presents to the emergency department with complaints of shortness of breath.  He has been admitted for Hypertensive urgency [I16.0] Flash pulmonary edema ([place]) [J81.0]  [facility]/TTS/right chest PermCath/69.0 kg  End-stage renal disease on hemodialysis.  Last treatment completed on Saturday.  Chest x-ray shows pulmonary edema.  Patient will receive dialysis today, UF goal 2 L as tolerated.  Next treatment scheduled for Thursday.  2. Anemia of chronic kidney disease  Lab Results  Component Value Date   HGB 9.1 (L) 05/23/2022    Hemoglobin within desired range.  Patient receives [place] outpatient.   3. Secondary Hyperparathyroidism: with outpatient labs: PTH 331, phosphorus 4.8, calcium 8.8 on 05/02/22.   Lab Results  Component Value Date   CALCIUM 8.3 (L) 05/23/2022   CAION 1.23 09/29/2021   PHOS 4.6 05/18/2022    Will continue to monitor bone minerals during this admission.    4. Diabetes mellitus type II with chronic kidney disease/renal manifestations: insulin dependent. Home regimen includes NovoLog, Humulog, Levemir. Most recent hemoglobin A1c is 10.1 on 07/14/21.     Glucose elevated on ED arrival.  Primary team to manage sliding scale insulin.    LOS: 0   12/5/20232:34 PM

## 2022-05-23 NOTE — Progress Notes (Signed)
Post hd rn assessment 

## 2022-05-23 NOTE — Progress Notes (Addendum)
Brief Rounding Note, same day as admission  Patient with ESRD on dialysis was admitted after midnight with flash pulmonary edema and hypertensive urgency with elevated troponin due to likely demand ischemia.  Nephrology and cardiology were consulted on admission.  Interval history:  Pt seen in dialysis. Reports feeling terrible, is very hungry.  Shakes his head no, does not feel breathing is any better, but appears in no distress.     Physical exam: General exam: awake, alert, no acute distress, chronically ill appearing HEENT: moist mucus membranes, hearing grossly normal  Respiratory system: diminished breath sounds due to poor inspiratory effort, normal respiratory effort. Cardiovascular system: normal S1/S2, RRR Central nervous system: no gross focal neurologic deficits, normal speech Extremities: moves all, no edema, normal tone Skin: dry, intact, normal temperature Psychiatry: normal mood, congruent affect, judgement and insight appear normal     A&P -- as outlined in H&P by Dr. Sidney Ace, and per nephrology and cardiology. Any changes or additions to the plan outlined below:  --Dialysis completed today --BP improved, Cardene drip stopped --No longer needs stepdown --Admit order changed to med/tele --Hyperglycemic with sugars in 300's - insulin adjusted: extra 10 units Levemir this AM, added Novolog 3 units TID WC.  Adjust insulin as needed, goal 140-180   No charge

## 2022-05-23 NOTE — Consult Note (Signed)
Cardiology Consultation   Patient ID: Jimmy Olson MRN: [id number]; DOB: Jun 19, 1961  Admit date: 05/22/2022 Date of Consult: 05/23/2022  PCP:  [facility] [place] [facility] Cardiologist:  Ida Rogue, MD        Patient Profile:   Jimmy Olson is a 61 y.o. male with a hx of ischemic and nonischemic cardiomyopathy, HFrEF, obstructive coronary artery disease, end-stage renal disease on hemodialysis type 2 diabetes, hypertension, hyperlipidemia, TIA, history of DVT, who is being seen 05/23/2022 for the evaluation of acute on chronic CHF and chest pain at the request of Dr. Sidney Ace.  History of Present Illness:   Jimmy Olson past medical history of insulin-dependent type 2 diabetes, chronic systolic congestive heart failure with an EF of 45%, end-stage renal disease initiated on dialysis in December 2022, anemia of chronic disease, two-vessel coronary artery disease on left heart catheterization in December 2022, DVT of the left axillary vein in December 2022 on apixaban with recent admission for TIA with an MRI showing ulcerated plaque of the proximal left ICA.   He had an admission for non-STEMI in February 2023 with no intervention performed given prior catheterization in December 2022.  At that time he was positive for COVID-19 infection and was considered for intervention in [place] given the need for atherectomy.  During his of 09/2021 admission to the hospital he underwent ligation of the left radiocephalic AV fistula, carotid Doppler which showed no hemodynamically significant stenosis.  Left upper extremity Doppler was negative for DVT.  He was evaluated by neurology who recommended MRI of the brain which was negative for acute stroke.  He had diffuse pain in his left upper extremity which showed diffuse subcutaneous soft tissue swelling from the distal left arm to the forearm cellulitis versus lymphedema.  He also had steal syndrome as a  complication of his dialysis access status post emergent ligation by vascular.  During his hospitalization he had hypertensive emergency with blood pressure of 167/88 was continued on Imdur, hydralazine, labetalol, carvedilol, and losartan.  He was discharged from the hospital on 10/08/2021.  He has had several emergency department visits since his discharged in April.  He presented back to the emergency department with admission  05/17/22 with complaints of shortness of breath, chest tightness with exertion.  He continued with hemodialysis during his hospitalization repeat echocardiogram revealed LV EF 40-45% with G2 DD consistent with prior readings, IV Lasix was discontinued and he was discharged on torsemide 20 mg daily on 05/19/2022.  He presented to the [facility] emergency department 05/22/2022 with complaints of shortness of breath.  End-stage renal disease on hemodialysis Monday Wednesday and Friday with no missed dialysis sessions.  He stated he had shortness of breath, chest pressure and nonproductive cough that started yesterday.  Chest discomfort he stated felt like a squeezing that he rated 3 out of 10 in severity with radiation into his left arm.  Denied any other associated symptoms of nausea, vomiting, or diaphoresis.  He denies any fever or chills.  He does admit to two-pillow orthopnea and dyspnea on exertion.  Initial vital signs: Blood pressure 210/106, pulse of 86, respirations of 24, temperature of 98.6  Labs: Blood glucose 294, BUN 51, serum creatinine of 5.61, calcium 8.8, GFR of 11, hemoglobin 9.8, hematocrit 28.4, BNP 3889.8, high-sensitivity troponin 478 and 462, respiratory panel negative  Imaging: Chest x-ray revealed progressive interstitial pulmonary edema now mild to moderate in severity  Medications administered in the emergency department:  Cardene drip, heparin infusion, hydralazine 10 mg IVP, furosemide 40 mg IVP, aspirin 324 mg   Past Medical History:  Diagnosis Date    Anemia    Anxiety and depression    ATN (acute tubular necrosis) (HCC)    CAD (coronary artery disease)    a.) R/LHC 07/19/2020: 20% pRCA, 30% mRCA, 50% dRCA, 50% RPAV, 40% pLAD, 70% D1, 60% mLAD; LVEDP 19 mmHg; PA 37/16 (26 mmHg); PCWP 13 mmHg. b.) LHC 06/03/2021: EF 45-50%; LVEDP 18 mmHg; 40% pLAD, 60% mLAD, 20% pRCA, 30% mRCA, 70% D1, 30% dRCA, 40% RPDA.   Chronic HFrEF (heart failure with reduced ejection fraction) ([place])    a.) 06/2020 Echo: EF 30-35%, glob HK, GrII DD, nl RV fxn. Mild LAE. b.)  TTE 08/08/2021: EF 40-45%; global HK; mild LVH; GLS -8.6; mild LA dilation.   DVT of axillary vein, acute left ([place]) 05/2021   a.) Tx'd with apixaban   ESRD (end stage renal disease) on dialysis [facility])    a.) M-W-F   History of 2019 novel coronavirus disease (COVID-19) 03/06/2020   a.) s/p Tx with monoclonal Ab infusion   HLD (hyperlipidemia)    Hyperkalemia 12/23/2020   Hypertension    Left carotid artery stenosis 08/18/2021   a.) Carotid Doppler 84/69/6295: 28-41% LICA stenosis   Long term current use of anticoagulant    a.) apixaban for DVT   NICM (nonischemic cardiomyopathy) ([place])    a.) 07/2009 MV: EF 48%; b.) 08/2019 Echo: EF 45-50%. Global HK. Mod LVH. GrIDD; c.) 06/2020 Echo: EF 30-35%, glob HK, mild LVH, GrII DD, nl RV fxn, mild LAE; d.) 06/2020 Cath: nonobs dzs. e.)  LHC 06/03/2021; EF 45-50%; LVEDP 18 mmHg; nonobstructive CAD. f.) b.)  TTE 08/08/2021: EF 40-45%; global HK; mild LVH; GLS -8.6; mild LA dilation.   NSTEMI (non-ST elevated myocardial infarction) ([place]) 07/15/2020   a.) hsTnI trended: 124--> 127--> 176 --> 190 --> 200 ng/L.   Sepsis ([place])    Suicidal ideations    TIA (transient ischemic attack) 06/2021   Type 2 diabetes mellitus treated with insulin [facility])     Past Surgical History:  Procedure Laterality Date   APPENDECTOMY     AV FISTULA PLACEMENT Left 09/29/2021   Procedure: ARTERIOVENOUS (AV) FISTULA CREATION ( RADIAL CEPHALIC);  Surgeon: Algernon Huxley, MD;  Location:  [facility] ORS;  Service: Vascular;  Laterality: Left;   CARDIAC CATHETERIZATION     DIALYSIS/PERMA CATHETER INSERTION N/A 06/02/2021   Procedure: DIALYSIS/PERMA CATHETER INSERTION;  Surgeon: Algernon Huxley, MD;  Location: [place] CV LAB;  Service: Cardiovascular;  Laterality: N/A;   LEFT HEART CATH AND CORONARY ANGIOGRAPHY N/A 06/03/2021   Procedure: LEFT HEART CATH AND CORONARY ANGIOGRAPHY;  Surgeon: Wellington Hampshire, MD;  Location: [place] [facility] LAB;  Service: Cardiovascular;  Laterality: N/A;   LIGATION OF ARTERIOVENOUS  FISTULA Left 10/02/2021   Procedure: LIGATION OF ARTERIOVENOUS  FISTULA;  Surgeon: Evaristo Bury, MD;  Location: [facility] ORS;  Service: Vascular;  Laterality: Left;   NASAL SINUS SURGERY     RIGHT/LEFT HEART CATH AND CORONARY ANGIOGRAPHY N/A 07/19/2020   Procedure: RIGHT/LEFT HEART CATH AND CORONARY ANGIOGRAPHY;  Surgeon: Wellington Hampshire, MD;  Location: [place] CV LAB;  Service: Cardiovascular;  Laterality: N/A;     Home Medications:  Prior to Admission medications   Medication Sig Start Date End Date Taking? Authorizing Provider  apixaban (ELIQUIS) 5 MG TABS tablet TAKE 1 TABLET BY MOUTH TWICE DAILY. 05/19/22  Yes Sreenath, [employer]  B, MD  carvedilol (COREG) 25 MG tablet Take 1 tablet (25 mg total) by mouth 2 (two) times daily. 11/29/21  Yes Hackney, Otila Kluver A, FNP  escitalopram (LEXAPRO) 10 MG tablet Take 1 tablet (10 mg total) by mouth at bedtime. 05/19/22 06/18/22 Yes Sreenath, Sudheer B, MD  hydrALAZINE (APRESOLINE) 100 MG tablet Take 1 tablet (100 mg total) by mouth 2 (two) times daily. 05/19/22 06/18/22 Yes Sreenath, Sudheer B, MD  insulin detemir (LEVEMIR FLEXPEN) 100 UNIT/ML FlexPen Inject 20 Units into the skin once daily at bedtime. Patient taking differently: Inject 18 Units into the skin at bedtime. 10/10/21 05/22/22 Yes   Insulin Pen Needle 32G X 4 MM MISC USE AS DIRECTED 06/07/21  Yes Sreenath, Sudheer B, MD  isosorbide mononitrate (IMDUR) 30 MG 24 hr tablet  Take 1 tablet (30 mg total) by mouth in the morning and at bedtime. 11/29/21  Yes Hackney, Otila Kluver A, FNP  losartan (COZAAR) 100 MG tablet Take 1 tablet (100 mg total) by mouth daily. 05/19/22 06/18/22 Yes Sreenath, Sudheer B, MD  NOVOLOG FLEXPEN 100 UNIT/ML FlexPen Inject 0-15 Units into the skin 3 (three) times daily with meals. 0-15 units tid 0-5 units qhs 12/28/21  Yes [provider]  Nutritional Supplements (FEEDING SUPPLEMENT, NEPRO CARB STEADY,) LIQD Take 237 mLs by mouth 3 (three) times daily between meals. 10/08/21  Yes Nicole Kindred A, DO  pantoprazole (PROTONIX) 20 MG tablet Take 1 tablet (20 mg total) by mouth daily. 05/19/22 06/18/22 Yes Sreenath, Sudheer B, MD  rosuvastatin (CRESTOR) 40 MG tablet Take 1 tablet (40 mg total) by mouth daily. 05/19/22 06/18/22 Yes Sreenath, Sudheer B, MD  torsemide (DEMADEX) 20 MG tablet Take 1 tablet (20 mg total) by mouth daily. Takes on Sat and Sun 05/19/22 08/11/22 Yes Sreenath, Sudheer B, MD  acetaminophen (TYLENOL) 325 MG tablet Take 650 mg by mouth every 6 (six) hours as needed.    [provider]  albuterol (PROVENTIL HFA) 108 (90 Base) MCG/ACT inhaler INHALE 2 PUFFS INTO THE LUNGS ONCE EVERY 6 HOURS AS NEEDED FOR WHEEZING OR SHORTNESS OF BREATH. 07/13/21     nitroGLYCERIN (NITROSTAT) 0.4 MG SL tablet Place 1 tablet (0.4 mg total) under the tongue every 5 (five) minutes as needed for chest pain. Patient not taking: Reported on 05/22/2022 05/19/22   Sidney Ace, MD  gabapentin (NEURONTIN) 300 MG capsule Take 1 capsule (300 mg total) by mouth at bedtime for 7 days. 06/07/21 08/18/21  Sidney Ace, MD  isosorbide dinitrate (ISORDIL) 30 MG tablet TAKE 1 TABLET BY MOUTH ONCE DAILY. 07/12/21 07/13/21      Inpatient Medications: Scheduled Meds:  carvedilol  25 mg Oral BID WC   Chlorhexidine Gluconate Cloth  6 each Topical Q0600   escitalopram  10 mg Oral QHS   furosemide  60 mg Intravenous BID WC   hydrALAZINE  100 mg Oral BID    insulin aspart  0-20 Units Subcutaneous TID WC   insulin aspart  0-5 Units Subcutaneous QHS   insulin aspart  3 Units Subcutaneous TID WC   insulin detemir  10 Units Subcutaneous Once   insulin detemir  18 Units Subcutaneous QHS   isosorbide mononitrate  30 mg Oral Daily   losartan  100 mg Oral Daily   pantoprazole  20 mg Oral Daily   rosuvastatin  40 mg Oral Daily   Continuous Infusions:  anticoagulant sodium citrate     heparin 800 Units/hr (05/23/22 0723)   PRN Meds: acetaminophen **OR** acetaminophen, albuterol,  alteplase, anticoagulant sodium citrate, heparin, lidocaine (PF), lidocaine-prilocaine, magnesium hydroxide, nitroGLYCERIN, ondansetron **OR** ondansetron (ZOFRAN) IV, pentafluoroprop-tetrafluoroeth, traZODone  Allergies:   No Known Allergies  Social History:   Social History   Socioeconomic History   Marital status: Married    Spouse name: Ana   Number of children: 7   Years of education: Not on file   Highest education level: Not on file  Occupational History   Not on file  Tobacco Use   Smoking status: Never   Smokeless tobacco: Never  Vaping Use   Vaping Use: Never used  Substance and Sexual Activity   Alcohol use: Not Currently   Drug use: Never   Sexual activity: Not on file  Other Topics Concern   Not on file  Social History Narrative   Lives locally with wife and son.  He is currently unemployed - has worked in different industries.     Social Determinants of Health   Financial Resource Strain: Not on file  Food Insecurity: Not on file  Transportation Needs: Not on file  Physical Activity: Not on file  Stress: Not on file  Social Connections: Not on file  Intimate Partner Violence: Not on file    Family History:    Family History  Problem Relation Age of Onset   Kidney failure Mother        died @ 58   Heart failure Mother    Other Father        he never knew his father   Diabetes Brother      ROS:  Please see the history of  present illness.  Review of Systems  Constitutional:  Positive for malaise/fatigue.  Respiratory:  Positive for shortness of breath.   Cardiovascular:  Positive for chest pain and leg swelling.  Neurological:  Positive for weakness.    All other ROS reviewed and negative.     Physical Exam/Data:   Vitals:   05/23/22 1230 05/23/22 1300 05/23/22 1330 05/23/22 1337  BP: 130/84 108/72 93/65 120/79  Pulse: 70 77 72 71  Resp: 20 (!) 22 17 15   Temp:   97.6 F (36.4 C)   TempSrc:   Oral   SpO2: 99% 100% 98% 99%  Weight:      Height:        Intake/Output Summary (Last 24 hours) at 05/23/2022 1411 Last data filed at 05/23/2022 1330 Gross per 24 hour  Intake 198.38 ml  Output 2350 ml  Net -2151.62 ml      05/23/2022    9:55 AM 05/22/2022   11:32 PM 05/19/2022    5:00 AM  Last 3 Weights  Weight (lbs) 173 lb 15.1 oz 147 lb 11.3 oz 147 lb 11.3 oz  Weight (kg) 78.9 kg 67 kg 67 kg     Body mass index is 28.08 kg/m.  General:  Well nourished, well developed, in no acute distress HEENT: normal Neck: no JVD Vascular: No carotid bruits; Distal pulses 2+ bilaterally Cardiac:  normal S1, S2; RRR; no murmur  Lungs:  clear upper lobes with diminished bases to auscultation bilaterally, no wheezing, rhonchi or rales, respirations are unlabored labored at rest currently on room air Abd: soft, nontender, no hepatomegaly  Ext: no edema Musculoskeletal:  No deformities, BUE and BLE strength normal and equal Skin: warm and dry  Neuro:  CNs 2-12 intact, no focal abnormalities noted Psych:  Normal affect   EKG:  The EKG was personally reviewed and demonstrates: Sinus rhythm with rate of 86,  LVH and early repolarization Telemetry:  Telemetry was personally reviewed and demonstrates:  sinus with rates 70-80  Relevant CV Studies: Echo 04/2022 1. Left ventricular ejection fraction, by estimation, is 40 to 45%. The  left ventricle has mildly decreased function. The left ventricle  demonstrates  global hypokinesis. There is mild left ventricular  hypertrophy. Small region of noncompaction noted in  the apical region, less likely mural thrombus (noted on prior studies).  Left ventricular diastolic parameters are consistent with Grade II  diastolic dysfunction (pseudonormalization).   2. Right ventricular systolic function is normal. The right ventricular  size is normal. Tricuspid regurgitation signal is inadequate for assessing  PA pressure.   3. The mitral valve is normal in structure. No evidence of mitral valve  regurgitation. No evidence of mitral stenosis.   4. The aortic valve was not well visualized. Aortic valve regurgitation  is not visualized. No aortic stenosis is present.   5. The inferior vena cava is normal in size with greater than 50%  respiratory variability, suggesting right atrial pressure of 3 mmHg.   Echo limited 07/2021  1. Left ventricular ejection fraction, by estimation, is 40 to 45%. The  left ventricle has mildly decreased function. The left ventricle  demonstrates global hypokinesis. The left ventricular internal cavity size  was mildly dilated. There is mild left  ventricular hypertrophy. Left ventricular diastolic function could not be  evaluated. The average left ventricular global longitudinal strain is -8.6  %. The global longitudinal strain is abnormal.   2. Right ventricular systolic function is normal. The right ventricular  size is normal. Tricuspid regurgitation signal is inadequate for assessing  PA pressure.   3. Left atrial size was mildly dilated.   4. The mitral valve is normal in structure. No evidence of mitral valve  regurgitation. No evidence of mitral stenosis.   5. The aortic valve is normal in structure. Aortic valve regurgitation is  not visualized. Aortic valve sclerosis is present, with no evidence of  aortic valve stenosis.   6. The inferior vena cava is normal in size with greater than 50%  respiratory variability,  suggesting right atrial pressure of 3 mmHg.    Echo 06/2019  1. Left ventricular ejection fraction, by estimation, is 45 to 50%. The  left ventricle has mildly decreased function. The left ventricle  demonstrates global hypokinesis. There is mild left ventricular  hypertrophy. Left ventricular diastolic parameters  are consistent with Grade I diastolic dysfunction (impaired relaxation).   2. Right ventricular systolic function is normal. The right ventricular  size is normal. Tricuspid regurgitation signal is inadequate for assessing  PA pressure.   3. The mitral valve is normal in structure. No evidence of mitral valve  regurgitation. No evidence of mitral stenosis.   4. The aortic valve was not well visualized. Aortic valve regurgitation  is not visualized. No aortic stenosis is present.   5. The inferior vena cava is normal in size with greater than 50%  respiratory variability, suggesting right atrial pressure of 3 mmHg.    Cardiac cath 05/2021   Prox LAD lesion is 40% stenosed.   Mid LAD lesion is 60% stenosed.   Prox RCA lesion is 20% stenosed.   Mid RCA lesion is 30% stenosed.   1st Diag lesion is 70% stenosed.   Dist RCA lesion is 30% stenosed.   RPDA lesion is 40% stenosed.   There is mild left ventricular systolic dysfunction.   LV end diastolic pressure is mildly elevated.  The left ventricular ejection fraction is 45-50% by visual estimate.   1.  Heavily calcified coronary arteries with moderate two-vessel coronary artery disease involving the LAD and right coronary artery.  No culprit is identified for non-STEMI. 2.  Mildly reduced LV systolic function and mildly elevated left ventricular end-diastolic pressure.   Recommendations: The patient disease appears to be chronic with no acute culprit identified.  Suspect that elevated troponin is due to supply demand ischemia.  FFR guided PCI of the LAD can be considered in the future if the patient has refractory anginal  symptoms.  For now, recommend aggressive medical therapy. Laboratory Data:  High Sensitivity Troponin:   Recent Labs  Lab 05/18/22 0850 05/19/22 0218 05/19/22 0407 05/22/22 2338 05/23/22 0209  TROPONINIHS 194* 202* 197* 478* 462*     Chemistry Recent Labs  Lab 05/19/22 0218 05/22/22 2338 05/23/22 0601  NA 135 139 138  K 3.5 4.5 3.7  CL 99 105 106  CO2 27 25 23   GLUCOSE 111* 294* 395*  BUN 29* 51* 58*  CREATININE 3.75* 5.61* 5.68*  CALCIUM 8.6* 8.8* 8.3*  MG  --  2.1  --   GFRNONAA 18* 11* 11*  ANIONGAP 9 9 9     Recent Labs  Lab 05/18/22 0850  ALBUMIN 3.1*   Lipids No results for input(s): "CHOL", "TRIG", "HDL", "LABVLDL", "LDLCALC", "CHOLHDL" in the last 168 hours.  Hematology Recent Labs  Lab 05/19/22 0218 05/22/22 2338 05/23/22 0601  WBC 4.6 5.3 5.6  RBC 3.47* 3.10* 2.97*  HGB 10.6* 9.8* 9.1*  HCT 31.2* 28.4* 27.1*  MCV 89.9 91.6 91.2  MCH 30.5 31.6 30.6  MCHC 34.0 34.5 33.6  RDW 12.7 12.6 12.6  PLT 167 198 174   Thyroid No results for input(s): "TSH", "FREET4" in the last 168 hours.  BNP Recent Labs  Lab 05/17/22 2023 05/22/22 2338  BNP 3,594.5* 3,889.8*    DDimer No results for input(s): "DDIMER" in the last 168 hours.   Radiology/Studies:  DG Chest Port 1 View  Result Date: 05/23/2022 CLINICAL DATA:  Dyspnea EXAM: PORTABLE CHEST 1 VIEW COMPARISON:  05/17/2022 FINDINGS: The lungs are symmetrically well expanded. No pneumothorax or pleural effusion. Mild cardiomegaly is stable. Diffuse interstitial pulmonary edema has progressed slightly in the interval and is now mild-to-moderate in severity. Right internal jugular hemodialysis catheter tip again noted at the superior cavoatrial junction. No acute bone abnormality. IMPRESSION: 1. Progressive interstitial pulmonary edema, now mild-to-moderate in severity. Electronically Signed   By: Fidela Salisbury M.D.   On: 05/23/2022 00:10     Assessment and Plan:   Acute on chronic combined systolic and  diastolic congestive heart failure -Denies current shortness of breath -LVEF 40-45% on echocardiogram from 04/2022 -BNP 3889.8 -Multiple emergency department visits in the last several months with complaints of shortness of breath and or chest pain -Currently on hemodialysis with no missed dialysis days -Continued on carvedilol 25 mg twice daily, furosemide 60 mg IV twice daily, hydralazine 100 mg twice daily, losartan 100 mg daily -Continue with heart failure education -Daily weight, I's and O's, low-sodium diet  Hypertensive urgency -Blood pressure 157/87 -Previously was on Cardene drip that has been weaned off -Continue current medication regimen -Vital signs per unit protocol  NSTEMI with known coronary artery disease -Complaint of chest pain and shortness of breath on arrival -Currently is chest pain-free -High-sensitivity troponins trended 478 down to 462 -No ischemic changes noted on EKG -Currently on heparin infusion -Continued on rosuvastatin and Imdur -Will be  scheduled for right and left heart catheterization on Thursday with elevated troponins several complaints of chest discomfort, last heart catheterization done 05/2021, to reevaluate known coronary artery disease and to get a better understanding of hemodynamics -Continue cardiac monitoring -EKG for pain and changes  End-stage renal disease on hemodialysis -Per nephrology -Patient previously was steal syndrome left side following AV graft creation April 13 -Status post emergent ligation of AV fistula/16 by BBS with improvement of symptoms -PTA apixaban for DVT remains on hold while patient is on heparin infusion  Dyslipidemia -Continued on rosuvastatin   Risk Assessment/Risk Scores:     TIMI Risk Score for Unstable Angina or Non-ST Elevation MI:   The patient's TIMI risk score is  , which indicates a  % risk of all cause mortality, new or recurrent myocardial infarction or need for urgent revascularization in  the next 14 days.  New York Heart Association (NYHA) Functional Class NYHA Class III        For questions or updates, please contact [place] Please consult [email or web address] for contact info under    Signed, Uchenna Rappaport, NP  05/23/2022 2:11 PM

## 2022-05-23 NOTE — ED Notes (Signed)
Report given to leala rn cpod nurse     pt moved to room 34

## 2022-05-23 NOTE — ED Notes (Signed)
Fsbs 159

## 2022-05-23 NOTE — Assessment & Plan Note (Addendum)
-  The patient has subsequent acute hypoxic respiratory failure. - The patient will be admitted to a stepdown unit bed. - He will be diuresed with IV Lasix. - Nephrology consult to be obtained for hemodialysis. - Cardiology consult to be obtained especially given elevated troponin I could be related to his acute CHF as well as non-STEMI. - We will follow serial troponins. - We will follow I's and O's and daily weights.

## 2022-05-24 DIAGNOSIS — I214 Non-ST elevation (NSTEMI) myocardial infarction: Secondary | ICD-10-CM | POA: Diagnosis not present

## 2022-05-24 DIAGNOSIS — I160 Hypertensive urgency: Secondary | ICD-10-CM | POA: Diagnosis not present

## 2022-05-24 DIAGNOSIS — I5043 Acute on chronic combined systolic (congestive) and diastolic (congestive) heart failure: Secondary | ICD-10-CM | POA: Diagnosis not present

## 2022-05-24 DIAGNOSIS — N186 End stage renal disease: Secondary | ICD-10-CM | POA: Diagnosis not present

## 2022-05-24 LAB — CBC
HCT: 29.1 % — ABNORMAL LOW (ref 39.0–52.0)
Hemoglobin: 9.9 g/dL — ABNORMAL LOW (ref 13.0–17.0)
MCH: 30.7 pg (ref 26.0–34.0)
MCHC: 34 g/dL (ref 30.0–36.0)
MCV: 90.1 fL (ref 80.0–100.0)
Platelets: 183 10*3/uL (ref 150–400)
RBC: 3.23 MIL/uL — ABNORMAL LOW (ref 4.22–5.81)
RDW: 12.4 % (ref 11.5–15.5)
WBC: 4.8 10*3/uL (ref 4.0–10.5)
nRBC: 0 % (ref 0.0–0.2)

## 2022-05-24 LAB — GLUCOSE, CAPILLARY
Glucose-Capillary: 186 mg/dL — ABNORMAL HIGH (ref 70–99)
Glucose-Capillary: 36 mg/dL — CL (ref 70–99)
Glucose-Capillary: 39 mg/dL — CL (ref 70–99)
Glucose-Capillary: 262 mg/dL — ABNORMAL HIGH (ref 70–99)

## 2022-05-24 LAB — BASIC METABOLIC PANEL
Anion gap: 7 (ref 5–15)
BUN: 28 mg/dL — ABNORMAL HIGH (ref 6–20)
CO2: 27 mmol/L (ref 22–32)
Calcium: 8.1 mg/dL — ABNORMAL LOW (ref 8.9–10.3)
Chloride: 102 mmol/L (ref 98–111)
Creatinine, Ser: 4 mg/dL — ABNORMAL HIGH (ref 0.61–1.24)
GFR, Estimated: 16 mL/min — ABNORMAL LOW (ref >60)
Glucose, Bld: 136 mg/dL — ABNORMAL HIGH (ref 70–99)
Potassium: 3.5 mmol/L (ref 3.5–5.1)
Sodium: 136 mmol/L (ref 135–145)

## 2022-05-24 LAB — HEPATITIS B SURFACE ANTIBODY, QUANTITATIVE: Hep B S AB Quant (Post): <3.1 m[IU]/mL — ABNORMAL LOW (ref >9.9)

## 2022-05-24 LAB — CBG MONITORING, ED: Glucose-Capillary: 289 mg/dL — ABNORMAL HIGH (ref 70–99)

## 2022-05-24 LAB — HEPARIN LEVEL (UNFRACTIONATED): Heparin Unfractionated: 1.01 IU/mL — ABNORMAL HIGH (ref 0.30–0.70)

## 2022-05-24 LAB — APTT
aPTT: 106 seconds — ABNORMAL HIGH (ref 24–36)
aPTT: 56 seconds — ABNORMAL HIGH (ref 24–36)
aPTT: 66 seconds — ABNORMAL HIGH (ref 24–36)

## 2022-05-24 MED ORDER — HEPARIN BOLUS VIA INFUSION
1000.0000 [IU] | Freq: Once | INTRAVENOUS | Status: AC
  Administered 2022-05-24: 1000 [IU] via INTRAVENOUS
  Filled 2022-05-24: qty 1000

## 2022-05-24 MED ORDER — DEXTROSE 50 % IV SOLN
INTRAVENOUS | Status: AC
  Administered 2022-05-24: 50 mL
  Filled 2022-05-24: qty 50

## 2022-05-24 MED ORDER — GLUCAGON HCL RDNA (DIAGNOSTIC) 1 MG IJ SOLR
INTRAMUSCULAR | Status: AC
  Filled 2022-05-24: qty 1

## 2022-05-24 MED ORDER — INSULIN DETEMIR 100 UNIT/ML ~~LOC~~ SOLN
18.0000 [IU] | Freq: Every day | SUBCUTANEOUS | Status: DC
  Administered 2022-05-24 – 2022-05-25 (×2): 18 [IU] via SUBCUTANEOUS
  Filled 2022-05-24 (×3): qty 0.18

## 2022-05-24 MED ORDER — INSULIN ASPART 100 UNIT/ML IJ SOLN
0.0000 [IU] | Freq: Three times a day (TID) | INTRAMUSCULAR | Status: DC
  Administered 2022-05-25 – 2022-05-26 (×2): 7 [IU] via SUBCUTANEOUS
  Administered 2022-05-26 – 2022-05-27 (×2): 2 [IU] via SUBCUTANEOUS
  Administered 2022-05-27: 3 [IU] via SUBCUTANEOUS
  Administered 2022-05-28: 1 [IU] via SUBCUTANEOUS
  Administered 2022-05-28: 5 [IU] via SUBCUTANEOUS
  Filled 2022-05-24 (×3): qty 1

## 2022-05-24 MED ORDER — DEXTROSE 50 % IV SOLN: 25.0000 g | INTRAVENOUS | Status: AC

## 2022-05-24 MED ORDER — INSULIN ASPART 100 UNIT/ML IJ SOLN
0.0000 [IU] | Freq: Every day | INTRAMUSCULAR | Status: DC
  Administered 2022-05-24 – 2022-05-25 (×2): 2 [IU] via SUBCUTANEOUS
  Filled 2022-05-24: qty 1

## 2022-05-24 MED ORDER — DEXTROSE-NACL 5-0.9 % IV SOLN: INTRAVENOUS | Status: DC

## 2022-05-24 NOTE — Progress Notes (Signed)
ANTICOAGULATION CONSULT NOTE - Initial Consult  Pharmacy Consult for Heparin  Indication: chest pain/ACS  No Known Allergies  Patient Measurements: Height: 5\' 6"  (167.6 cm) Weight: 77.5 kg (170 lb 13.7 oz) IBW/kg (Calculated) : 63.8 Heparin Dosing Weight: 67 kg   Vital Signs: Temp: 97.5 F (36.4 C) (12/06 1939) Temp Source: Oral (12/06 1631) BP: 148/78 (12/06 1939) Pulse Rate: 67 (12/06 1939)  Labs: Recent Labs    05/22/22 2338 05/22/22 2338 05/23/22 0209 05/23/22 0310 05/23/22 0601 05/23/22 1445 05/24/22 0002 05/24/22 0544 05/24/22 1055 05/24/22 1955  HGB 9.8*  --   --   --  9.1*  --   --  9.9*  --   --   HCT 28.4*  --   --   --  27.1*  --   --  29.1*  --   --   PLT 198  --   --   --  174  --   --  183  --   --   APTT  --    < >  --  29  --  68* 56*  --  66* 106*  LABPROT  --   --   --  14.2  --   --   --   --   --   --   INR  --   --   --  1.1  --   --   --   --   --   --   HEPARINUNFRC  --   --   --  >1.10*  --  >1.10*  --   --  1.01*  --   CREATININE 5.61*  --   --   --  5.68*  --   --  4.00*  --   --   TROPONINIHS 478*  --  462*  --   --   --   --   --   --   --    < > = values in this interval not displayed.     Estimated Creatinine Clearance: 19.5 mL/min (A) (by C-G formula based on SCr of 4 mg/dL (H)).   Medical History: Past Medical History:  Diagnosis Date   Anemia    Anxiety and depression    ATN (acute tubular necrosis) (HCC)    CAD (coronary artery disease)    a.) R/LHC 07/19/2020: 20% pRCA, 30% mRCA, 50% dRCA, 50% RPAV, 40% pLAD, 70% D1, 60% mLAD; LVEDP 19 mmHg; PA 37/16 (26 mmHg); PCWP 13 mmHg. b.) LHC 06/03/2021: EF 45-50%; LVEDP 18 mmHg; 40% pLAD, 60% mLAD, 20% pRCA, 30% mRCA, 70% D1, 30% dRCA, 40% RPDA.   Chronic HFrEF (heart failure with reduced ejection fraction) ([place])    a.) 06/2020 Echo: EF 30-35%, glob HK, GrII DD, nl RV fxn. Mild LAE. b.)  TTE 08/08/2021: EF 40-45%; global HK; mild LVH; GLS -8.6; mild LA dilation.   DVT of axillary  vein, acute left ([place]) 05/2021   a.) Tx'd with apixaban   ESRD (end stage renal disease) on dialysis [facility])    a.) M-W-F   History of 2019 novel coronavirus disease (COVID-19) 03/06/2020   a.) s/p Tx with monoclonal Ab infusion   HLD (hyperlipidemia)    Hyperkalemia 12/23/2020   Hypertension    Left carotid artery stenosis 08/18/2021   a.) Carotid Doppler 92/33/0076: 22-63% LICA stenosis   Long term current use of anticoagulant    a.) apixaban for DVT   NICM (nonischemic cardiomyopathy) ([place])  a.) 07/2009 MV: EF 48%; b.) 08/2019 Echo: EF 45-50%. Global HK. Mod LVH. GrIDD; c.) 06/2020 Echo: EF 30-35%, glob HK, mild LVH, GrII DD, nl RV fxn, mild LAE; d.) 06/2020 Cath: nonobs dzs. e.)  LHC 06/03/2021; EF 45-50%; LVEDP 18 mmHg; nonobstructive CAD. f.) b.)  TTE 08/08/2021: EF 40-45%; global HK; mild LVH; GLS -8.6; mild LA dilation.   NSTEMI (non-ST elevated myocardial infarction) ([place]) 07/15/2020   a.) hsTnI trended: 124--> 127--> 176 --> 190 --> 200 ng/L.   Sepsis ([place])    Suicidal ideations    TIA (transient ischemic attack) 06/2021   Type 2 diabetes mellitus treated with insulin (HCC)     Medications:  Medications Prior to Admission  Medication Sig Dispense Refill Last Dose   apixaban (ELIQUIS) 5 MG TABS tablet TAKE 1 TABLET BY MOUTH TWICE DAILY. 180 tablet 1 05/22/2022   carvedilol (COREG) 25 MG tablet Take 1 tablet (25 mg total) by mouth 2 (two) times daily. 180 tablet 3 05/22/2022   escitalopram (LEXAPRO) 10 MG tablet Take 1 tablet (10 mg total) by mouth at bedtime. 30 tablet 0 05/21/2022 at 2200   hydrALAZINE (APRESOLINE) 100 MG tablet Take 1 tablet (100 mg total) by mouth 2 (two) times daily. 60 tablet 0 05/22/2022   insulin detemir (LEVEMIR FLEXPEN) 100 UNIT/ML FlexPen Inject 20 Units into the skin once daily at bedtime. (Patient taking differently: Inject 18 Units into the skin at bedtime.) 15 mL 5 05/21/2022 at 2200   Insulin Pen Needle 32G X 4 MM MISC USE AS DIRECTED 100 each 0 supply    isosorbide mononitrate (IMDUR) 30 MG 24 hr tablet Take 1 tablet (30 mg total) by mouth in the morning and at bedtime. 90 tablet 3 05/22/2022   losartan (COZAAR) 100 MG tablet Take 1 tablet (100 mg total) by mouth daily. 30 tablet 0 05/21/2022 at 2200   NOVOLOG FLEXPEN 100 UNIT/ML FlexPen Inject 0-15 Units into the skin 3 (three) times daily with meals. 0-15 units tid 0-5 units qhs   05/22/2022   Nutritional Supplements (FEEDING SUPPLEMENT, NEPRO CARB STEADY,) LIQD Take 237 mLs by mouth 3 (three) times daily between meals.  0 Past Week   pantoprazole (PROTONIX) 20 MG tablet Take 1 tablet (20 mg total) by mouth daily. 30 tablet 0 05/22/2022   rosuvastatin (CRESTOR) 40 MG tablet Take 1 tablet (40 mg total) by mouth daily. 30 tablet 0 05/21/2022 at 2200   torsemide (DEMADEX) 20 MG tablet Take 1 tablet (20 mg total) by mouth daily. Takes on Sat and Sun 30 tablet 0 Past Week   acetaminophen (TYLENOL) 325 MG tablet Take 650 mg by mouth every 6 (six) hours as needed.   prn at prn   albuterol (PROVENTIL HFA) 108 (90 Base) MCG/ACT inhaler INHALE 2 PUFFS INTO THE LUNGS ONCE EVERY 6 HOURS AS NEEDED FOR WHEEZING OR SHORTNESS OF BREATH. 6.7 g 10 prn at prn   nitroGLYCERIN (NITROSTAT) 0.4 MG SL tablet Place 1 tablet (0.4 mg total) under the tongue every 5 (five) minutes as needed for chest pain. (Patient not taking: Reported on 05/22/2022) 25 tablet 0 Not Taking    Assessment: Pharmacy consulted to dose heparin for ACS/NSTEMI in this 61 year old male admitted with HTN urgency.   Pt was on Eliquis 5 mg PO BID PTA for DVT prophylaxis , last dose on 12/4 at unspecified time.  CrCl = 12.8 ml/min   Date Time aPTT/HL Rate/Comment 12/05 1445 68s / >1.1 Therapeutic x1; 800 un/hr 12/06  0002    56s                  SUBtherapeutic @ 800 units/hr  12/06 1055 66s / 1.01 Therapeutic but borderline, 900>1000 un/hr  12/06 1955 106  Supratherapeutic.   Goal of Therapy:  Heparin level 0.3-0.7 units/ml aPTT :  66 - 102   Monitor platelets by anticoagulation protocol: Yes   Plan:  aPTT is slightly supratherapeutic. Will decrease heparin infusion to 950 units/hr. Will recheck aPTT in 8 hours. Check heparin level and CBC with AM labs. Once aPTT and heparin level correlate switch to heparin level monitoring.    Oswald Hillock, PharmD, BCPS 05/24/2022,8:56 PM

## 2022-05-24 NOTE — Hospital Course (Addendum)
Patient with ESRD on dialysis admitted on the early morning 12/5 with flash pulmonary edema and hypertensive urgency with elevated troponin secondary to non-STEMI.  Nephrology and cardiology were consulted on admission.  Patient just discharged on 12/1 for volume overload and hypertensive emergency.  Stabilized with diuresis and dialysis.  Patient underwent cardiac catheterization on 12/8 noting progression of mid LAD stenosis to 80% but heavily calcified.  Plan is for outpatient staged LAD PCI with atherectomy at [facility].

## 2022-05-24 NOTE — Progress Notes (Signed)
Rounding Note    Patient Name: Jimmy Olson Date of Encounter: 05/24/2022  [place] Cardiologist: Ida Rogue, MD   Subjective   Patient seen on AM rounds. Denies any current chest pain or shortness of breath. States that over night he did not get much rest and is tired this morning. Wife remains at the bedside. -1.9 L output in the last 24 hours.  Inpatient Medications    Scheduled Meds:  carvedilol  25 mg Oral BID WC   Chlorhexidine Gluconate Cloth  6 each Topical Q0600   escitalopram  10 mg Oral QHS   furosemide  80 mg Intravenous BID WC   hydrALAZINE  100 mg Oral BID   insulin aspart  0-20 Units Subcutaneous TID WC   insulin aspart  0-5 Units Subcutaneous QHS   insulin aspart  3 Units Subcutaneous TID WC   insulin detemir  10 Units Subcutaneous Once   insulin detemir  18 Units Subcutaneous QHS   isosorbide mononitrate  30 mg Oral Daily   losartan  100 mg Oral Daily   pantoprazole  20 mg Oral Daily   rosuvastatin  40 mg Oral Daily   Continuous Infusions:  anticoagulant sodium citrate     heparin 900 Units/hr (05/24/22 0122)   PRN Meds: acetaminophen **OR** acetaminophen, albuterol, alteplase, anticoagulant sodium citrate, heparin, lidocaine (PF), lidocaine-prilocaine, magnesium hydroxide, nitroGLYCERIN, ondansetron **OR** ondansetron (ZOFRAN) IV, pentafluoroprop-tetrafluoroeth, traZODone   Vital Signs    Vitals:   05/24/22 0122 05/24/22 0200 05/24/22 0500 05/24/22 0548  BP:  (!) 160/86 (!) 153/80   Pulse:  74 71   Resp:  16 14   Temp: 97.9 F (36.6 C)   98 F (36.7 C)  TempSrc: Oral   Oral  SpO2:  98% 97%   Weight:      Height:        Intake/Output Summary (Last 24 hours) at 05/24/2022 1057 Last data filed at 05/23/2022 1840 Gross per 24 hour  Intake 95.71 ml  Output 2000 ml  Net -1904.29 ml      05/23/2022    2:15 PM 05/23/2022    9:55 AM 05/22/2022   11:32 PM  Last 3 Weights  Weight (lbs) 170 lb 13.7 oz 173 lb 15.1 oz 147 lb 11.3  oz  Weight (kg) 77.5 kg 78.9 kg 67 kg      Telemetry    Sinus rhythm with rate of 70s and 80s- Personally Reviewed  ECG    No new tracings - Personally Reviewed  Physical Exam   GEN: No acute distress.   Neck: No JVD Cardiac: RRR, no murmurs, rubs, or gallops.  Respiratory: Clear upper lobes with diminished bases to auscultation bilaterally.Respirations are unlabored on room air. GI: Soft, nontender, non-distended  MS: No edema; No deformity. Neuro:  Nonfocal  Psych: Normal affect   Labs    High Sensitivity Troponin:   Recent Labs  Lab 05/18/22 0850 05/19/22 0218 05/19/22 0407 05/22/22 2338 05/23/22 0209  TROPONINIHS 194* 202* 197* 478* 462*     Chemistry Recent Labs  Lab 05/18/22 0850 05/19/22 0218 05/22/22 2338 05/23/22 0601 05/24/22 0544  NA 140   < > 139 138 136  K 3.6   < > 4.5 3.7 3.5  CL 107   < > 105 106 102  CO2 25   < > 25 23 27   GLUCOSE 137*   < > 294* 395* 136*  BUN 52*   < > 51* 58* 28*  CREATININE 4.87*   < >  5.61* 5.68* 4.00*  CALCIUM 8.4*   < > 8.8* 8.3* 8.1*  MG  --   --  2.1  --   --   ALBUMIN 3.1*  --   --   --   --   GFRNONAA 13*   < > 11* 11* 16*  ANIONGAP 8   < > 9 9 7    < > = values in this interval not displayed.    Lipids No results for input(s): "CHOL", "TRIG", "HDL", "LABVLDL", "LDLCALC", "CHOLHDL" in the last 168 hours.  Hematology Recent Labs  Lab 05/22/22 2338 05/23/22 0601 05/24/22 0544  WBC 5.3 5.6 4.8  RBC 3.10* 2.97* 3.23*  HGB 9.8* 9.1* 9.9*  HCT 28.4* 27.1* 29.1*  MCV 91.6 91.2 90.1  MCH 31.6 30.6 30.7  MCHC 34.5 33.6 34.0  RDW 12.6 12.6 12.4  PLT 198 174 183   Thyroid No results for input(s): "TSH", "FREET4" in the last 168 hours.  BNP Recent Labs  Lab 05/17/22 2023 05/22/22 2338  BNP 3,594.5* 3,889.8*    DDimer No results for input(s): "DDIMER" in the last 168 hours.   Radiology    DG Chest Port 1 View  Result Date: 05/23/2022 CLINICAL DATA:  Dyspnea EXAM: PORTABLE CHEST 1 VIEW COMPARISON:   05/17/2022 FINDINGS: The lungs are symmetrically well expanded. No pneumothorax or pleural effusion. Mild cardiomegaly is stable. Diffuse interstitial pulmonary edema has progressed slightly in the interval and is now mild-to-moderate in severity. Right internal jugular hemodialysis catheter tip again noted at the superior cavoatrial junction. No acute bone abnormality. IMPRESSION: 1. Progressive interstitial pulmonary edema, now mild-to-moderate in severity. Electronically Signed   By: Fidela Salisbury M.D.   On: 05/23/2022 00:10    Cardiac Studies   Echo 04/2022 1. Left ventricular ejection fraction, by estimation, is 40 to 45%. The  left ventricle has mildly decreased function. The left ventricle  demonstrates global hypokinesis. There is mild left ventricular  hypertrophy. Small region of noncompaction noted in  the apical region, less likely mural thrombus (noted on prior studies).  Left ventricular diastolic parameters are consistent with Grade II  diastolic dysfunction (pseudonormalization).   2. Right ventricular systolic function is normal. The right ventricular  size is normal. Tricuspid regurgitation signal is inadequate for assessing  PA pressure.   3. The mitral valve is normal in structure. No evidence of mitral valve  regurgitation. No evidence of mitral stenosis.   4. The aortic valve was not well visualized. Aortic valve regurgitation  is not visualized. No aortic stenosis is present.   5. The inferior vena cava is normal in size with greater than 50%  respiratory variability, suggesting right atrial pressure of 3 mmHg.    Echo limited 07/2021  1. Left ventricular ejection fraction, by estimation, is 40 to 45%. The  left ventricle has mildly decreased function. The left ventricle  demonstrates global hypokinesis. The left ventricular internal cavity size  was mildly dilated. There is mild left  ventricular hypertrophy. Left ventricular diastolic function could not be   evaluated. The average left ventricular global longitudinal strain is -8.6  %. The global longitudinal strain is abnormal.   2. Right ventricular systolic function is normal. The right ventricular  size is normal. Tricuspid regurgitation signal is inadequate for assessing  PA pressure.   3. Left atrial size was mildly dilated.   4. The mitral valve is normal in structure. No evidence of mitral valve  regurgitation. No evidence of mitral stenosis.   5. The  aortic valve is normal in structure. Aortic valve regurgitation is  not visualized. Aortic valve sclerosis is present, with no evidence of  aortic valve stenosis.   6. The inferior vena cava is normal in size with greater than 50%  respiratory variability, suggesting right atrial pressure of 3 mmHg.    Echo 06/2019  1. Left ventricular ejection fraction, by estimation, is 45 to 50%. The  left ventricle has mildly decreased function. The left ventricle  demonstrates global hypokinesis. There is mild left ventricular  hypertrophy. Left ventricular diastolic parameters  are consistent with Grade I diastolic dysfunction (impaired relaxation).   2. Right ventricular systolic function is normal. The right ventricular  size is normal. Tricuspid regurgitation signal is inadequate for assessing  PA pressure.   3. The mitral valve is normal in structure. No evidence of mitral valve  regurgitation. No evidence of mitral stenosis.   4. The aortic valve was not well visualized. Aortic valve regurgitation  is not visualized. No aortic stenosis is present.   5. The inferior vena cava is normal in size with greater than 50%  respiratory variability, suggesting right atrial pressure of 3 mmHg.    Cardiac cath 05/2021   Prox LAD lesion is 40% stenosed.   Mid LAD lesion is 60% stenosed.   Prox RCA lesion is 20% stenosed.   Mid RCA lesion is 30% stenosed.   1st Diag lesion is 70% stenosed.   Dist RCA lesion is 30% stenosed.   RPDA lesion is 40%  stenosed.   There is mild left ventricular systolic dysfunction.   LV end diastolic pressure is mildly elevated.   The left ventricular ejection fraction is 45-50% by visual estimate.   1.  Heavily calcified coronary arteries with moderate two-vessel coronary artery disease involving the LAD and right coronary artery.  No culprit is identified for non-STEMI. 2.  Mildly reduced LV systolic function and mildly elevated left ventricular end-diastolic pressure.   Recommendations: The patient disease appears to be chronic with no acute culprit identified.  Suspect that elevated troponin is due to supply demand ischemia.  FFR guided PCI of the LAD can be considered in the future if the patient has refractory anginal symptoms.  For now, recommend aggressive medical therapy.  Patient Profile     61 y.o. male with history of ischemic nonischemic cardiomyopathy, HFrEF, obstructive coronary artery disease, end-stage renal disease on hemodialysis, type 2 diabetes, hypertension, hyperlipidemia, TIA, history of DVT, who is being seen and evaluated for acute on chronic CHF and chest pain.  Assessment & Plan    Acute on chronic combined systolic and diastolic congestive heart failure -LVEF 40-45% on echocardiogram from 04/2022 -BNP on arrival was 3889.8 -Multiple emergency department visits in the last several months with complaints of shortness of breath and chest pain -Currently on hemodialysis with no missed dialysis days -Continued on carvedilol 25 mg twice daily, furosemide 80 mg IV twice daily, hydralazine 100 mg twice daily, losartan 100 mg daily -Continue with heart failure education -Daily weight, I's and O's, and low-sodium diet -Unable to escalate GDMT due to end-stage renal disease  NSTEMI with known coronary artery disease -On arrival presented with chest pain and shortness of breath -Currently is chest pain-free -High-sensitivity troponins peaked at 478 -No ischemic changes noted on  EKG -Continued on heparin infusion -Continued on rosuvastatin and Imdur -Scheduled for right and left heart catheterization on Thursday with elevated troponin several complaints of chest discomfort last heart catheterization done 05/2021 to prevent known  coronary artery disease and get a better understand hemodynamics -Continue with cardiac monitoring -EKG for pain or changes  Hypertensive  -Blood pressure 161/79 -Continued on current medication regimen -Vital signs per unit protocol -will likely undergo dialysis again today  End stage renal disease on hemodialysis -Per nephrology -Of note patient previously had steal syndrome left side following AV graft creation April 13 status post emergent litigation of AV fistula 4/16 by [facility] with improvement of symptoms -PTA apixaban for DVT remains on hold while patient is on heparin infusion will need to be restarted prior to discharge -We will touch base with nephrology today to ensure patient can undergo dialysis after right and left heart catheterization procedure tomorrow  Dyslipidemia -Continued on rosuvastatin  Shared Decision Making/Informed Consent The risks [stroke (1 in 1000), death (1 in 1000), kidney failure [usually temporary] (1 in 500), bleeding (1 in 200), allergic reaction [possibly serious] (1 in 200)], benefits (diagnostic support and management of coronary artery disease) and alternatives of a cardiac catheterization were discussed in detail with Mr. Nichelson and he is willing to proceed.      For questions or updates, please contact [place] Please consult [email or web address] for contact info under        Signed, Donell Tomkins, NP  05/24/2022, 10:57 AM

## 2022-05-24 NOTE — Progress Notes (Signed)
Awaiting full set of vitals before accepting patient to unit

## 2022-05-24 NOTE — Progress Notes (Signed)
ANTICOAGULATION CONSULT NOTE - Initial Consult  Pharmacy Consult for Heparin  Indication: chest pain/ACS  No Known Allergies  Patient Measurements: Height: 5\' 6"  (167.6 cm) Weight: 77.5 kg (170 lb 13.7 oz) IBW/kg (Calculated) : 63.8 Heparin Dosing Weight: 67 kg   Vital Signs: Temp: 97.8 F (36.6 C) (12/05 2230) Temp Source: Oral (12/05 2230) BP: 153/85 (12/05 2300) Pulse Rate: 75 (12/05 2300)  Labs: Recent Labs    05/22/22 2338 05/23/22 0209 05/23/22 0310 05/23/22 0601 05/23/22 1445 05/24/22 0002  HGB 9.8*  --   --  9.1*  --   --   HCT 28.4*  --   --  27.1*  --   --   PLT 198  --   --  174  --   --   APTT  --   --  29  --  68* 56*  LABPROT  --   --  14.2  --   --   --   INR  --   --  1.1  --   --   --   HEPARINUNFRC  --   --  >1.10*  --  >1.10*  --   CREATININE 5.61*  --   --  5.68*  --   --   TROPONINIHS 478* 462*  --   --   --   --      Estimated Creatinine Clearance: 13.7 mL/min (A) (by C-G formula based on SCr of 5.68 mg/dL (H)).   Medical History: Past Medical History:  Diagnosis Date   Anemia    Anxiety and depression    ATN (acute tubular necrosis) (HCC)    CAD (coronary artery disease)    a.) R/LHC 07/19/2020: 20% pRCA, 30% mRCA, 50% dRCA, 50% RPAV, 40% pLAD, 70% D1, 60% mLAD; LVEDP 19 mmHg; PA 37/16 (26 mmHg); PCWP 13 mmHg. b.) LHC 06/03/2021: EF 45-50%; LVEDP 18 mmHg; 40% pLAD, 60% mLAD, 20% pRCA, 30% mRCA, 70% D1, 30% dRCA, 40% RPDA.   Chronic HFrEF (heart failure with reduced ejection fraction) ([place])    a.) 06/2020 Echo: EF 30-35%, glob HK, GrII DD, nl RV fxn. Mild LAE. b.)  TTE 08/08/2021: EF 40-45%; global HK; mild LVH; GLS -8.6; mild LA dilation.   DVT of axillary vein, acute left ([place]) 05/2021   a.) Tx'd with apixaban   ESRD (end stage renal disease) on dialysis [facility])    a.) M-W-F   History of 2019 novel coronavirus disease (COVID-19) 03/06/2020   a.) s/p Tx with monoclonal Ab infusion   HLD (hyperlipidemia)    Hyperkalemia 12/23/2020    Hypertension    Left carotid artery stenosis 08/18/2021   a.) Carotid Doppler 25/85/2778: 24-23% LICA stenosis   Long term current use of anticoagulant    a.) apixaban for DVT   NICM (nonischemic cardiomyopathy) ([place])    a.) 07/2009 MV: EF 48%; b.) 08/2019 Echo: EF 45-50%. Global HK. Mod LVH. GrIDD; c.) 06/2020 Echo: EF 30-35%, glob HK, mild LVH, GrII DD, nl RV fxn, mild LAE; d.) 06/2020 Cath: nonobs dzs. e.)  LHC 06/03/2021; EF 45-50%; LVEDP 18 mmHg; nonobstructive CAD. f.) b.)  TTE 08/08/2021: EF 40-45%; global HK; mild LVH; GLS -8.6; mild LA dilation.   NSTEMI (non-ST elevated myocardial infarction) ([place]) 07/15/2020   a.) hsTnI trended: 124--> 127--> 176 --> 190 --> 200 ng/L.   Sepsis ([place])    Suicidal ideations    TIA (transient ischemic attack) 06/2021   Type 2 diabetes mellitus treated with insulin ([place])  Medications:  (Not in a hospital admission)  Assessment: Pharmacy consulted to dose heparin for ACS/NSTEMI in this 61 year old male admitted with HTN urgency.   Pt was on Eliquis 5 mg PO BID PTA for DVT prophylaxis , last dose on 12/4 at unspecified time.  CrCl = 12.8 ml/min   Date Time aPTT/HL Rate/Comment 12/05 1445 68s / >1.1 Therapeutic x1; 800 un/hr 12/06   0002     56s                SUBtherapeutic @ 800 units/hr     Goal of Therapy:  Heparin level 0.3-0.7 units/ml aPTT :  66 - 102  Monitor platelets by anticoagulation protocol: Yes   Plan:  12/06:  aPTT @ 0002 = 56 sec,  SUBtherapeutic Will order heparin 1000 units IV X 1 bolus and increase drip rate to 900 units/hr.  Will recheck HL and aPTT 8 hrs after rate change.  Will continue to use aPTT to guide dosing until correlating with HL.  Will check CBC daily.   Jimmy Olson D 05/24/2022,1:07 AM

## 2022-05-24 NOTE — Progress Notes (Signed)
[facility] Progress Note  Patient: Jimmy Olson    PPI:[id number]  DOA: 05/22/2022    Date of Service: the patient was seen and examined on 05/24/2022  Brief hospital course: Patient with ESRD on dialysis admitted on the early morning 12/5 with flash pulmonary edema and hypertensive urgency with elevated troponin due to likely demand ischemia. Nephrology and cardiology were consulted on admission.  Patient just discharged on 12/1 for volume overload and hypertensive emergency.   Assessment and Plan: * Acute on chronic combined systolic and diastolic CHF (congestive heart failure) (HCC)    Hypertensive urgency - This could be the culprit for his acute pulmonary edema.  Slowly improving - We will continue on IV Cardene drip. - We will continue his antihypertensives.  Non-ST elevation (NSTEMI) myocardial infarction [facility]) - He will be placed on IV heparin. Heart cath planned for Friday  End-stage renal disease on hemodialysis [facility]) - Seen by nephrology and had received urgent dialysis  Dyslipidemia - We will continue statin therapy.  GERD without esophagitis - We will continue PPI therapy  Acute metabolic encephalopathy Following admission, patient had episodes of hypoglycemia likely brought on by overcompensation from sliding scale.  See below.  Improved with D50.  Diabetes mellitus: Decreased p.o. intake and strong response to sliding scale.  Will tone down.  Overweight: Meets criteria with BMI greater than 25  Body mass index is 27.58 kg/m.        Consultants: Cardiology Nephrology  Procedures: Planned cardiac catheterization Hemodialysis  Antimicrobials: None  Code Status: Full code   Subjective: Complains of mild headache  Objective: Noted elevated blood pressures Vitals:   05/24/22 1529 05/24/22 1631  BP: (!) 169/92 131/75  Pulse: 74 (!) 59  Resp: 18 18  Temp: 97.9 F (36.6 C) (!) 97.4 F (36.3 C)  SpO2: 99% 100%    Intake/Output  Summary (Last 24 hours) at 05/24/2022 1911 Last data filed at 05/24/2022 1252 Gross per 24 hour  Intake --  Output 300 ml  Net -300 ml   Filed Weights   05/22/22 2332 05/23/22 0955 05/23/22 1415  Weight: 67 kg 78.9 kg 77.5 kg   Body mass index is 27.58 kg/m.  Exam:  General: Oriented x 2-3 HEENT: Normocephalic, atraumatic, mucous membranes slightly dry Cardiovascular: Regular rate and rhythm, S1-S2, 2 out of 6 systolic ejection murmur Respiratory: Decreased breath sounds bibasilar Abdomen: Soft, nontender, nondistended, positive bowel sounds Musculoskeletal: No clubbing or cyanosis, trace pitting edema Skin: No skin breaks, tears or lesions, fistula noted Psychiatry: Appropriate, no evidence of psychoses Neurology: No focal deficits  Data Reviewed: Morning labs reviewed  Disposition:  Status is: Inpatient Remains inpatient appropriate because:  -Stabilization of hypertensive urgency -Stabilization of blood sugars -Cardiac cath    Anticipated discharge date: 12/9  Family Communication: Will call daughter DVT Prophylaxis: Heparin infusion    Author: Annita Brod ,MD 05/24/2022 7:11 PM  To reach On-call, see care teams to locate the attending and reach out via [email or web address]. Between 7PM-7AM, please contact night-coverage If you still have difficulty reaching the attending provider, please page the [facility] (Director on Call) for [facility] on amion for assistance.

## 2022-05-24 NOTE — Progress Notes (Signed)
Inpatient Diabetes Program Recommendations  AACE/ADA: New Consensus Statement on Inpatient Glycemic Control   Target Ranges:  Prepandial:   less than 140 mg/dL      Peak postprandial:   less than 180 mg/dL (1-2 hours)      Critically ill patients:  140 - 180 mg/dL    Latest Reference Range & Units 05/23/22 08:05 05/23/22 14:11 05/23/22 17:18 05/23/22 22:04 05/24/22 12:33  Glucose-Capillary 70 - 99 mg/dL 354 (H)  Novolog 20 units 160 (H) 159 (H)  Novolog 4 units 76     Levemir 18 units 289 (H)   Novolog 14 units    Review of Glycemic Control  Diabetes history: DM2 Outpatient Diabetes medications: Levemir 18 units QHS, Novolog 0-15 units TID, Novolog 0-5 units HS Current orders for Inpatient glycemic control: Levemir 18 units QHS, Novolog 0-20 units with meals, Novolog 0-5 units QHS, Novolog 3 units TID with meals  Inpatient Diabetes Program Recommendations:    Insulin: Lab glucose 136 mg/dl today. No Novolog correction or meal coverage given with breakfast; glucose up to 289 mg/dl at 12:33 today. Please consider decreasing Novolog correction scale to 0-9 units TID with meals.  Thanks, Barnie Alderman, RN, MSN, [place] [facility] Coordinator Inpatient Diabetes Program [phone] (Team Pager from 8am to [place])

## 2022-05-24 NOTE — Progress Notes (Signed)
ANTICOAGULATION CONSULT NOTE - Initial Consult  Pharmacy Consult for Heparin  Indication: chest pain/ACS  No Known Allergies  Patient Measurements: Height: 5\' 6"  (167.6 cm) Weight: 77.5 kg (170 lb 13.7 oz) IBW/kg (Calculated) : 63.8 Heparin Dosing Weight: 67 kg   Vital Signs: Temp: 98 F (36.7 C) (12/06 0548) Temp Source: Oral (12/06 0548) BP: 153/80 (12/06 0500) Pulse Rate: 71 (12/06 0500)  Labs: Recent Labs    05/22/22 2338 05/23/22 0209 05/23/22 0310 05/23/22 0601 05/23/22 1445 05/24/22 0002 05/24/22 0544  HGB 9.8*  --   --  9.1*  --   --  9.9*  HCT 28.4*  --   --  27.1*  --   --  29.1*  PLT 198  --   --  174  --   --  183  APTT  --   --  29  --  68* 56*  --   LABPROT  --   --  14.2  --   --   --   --   INR  --   --  1.1  --   --   --   --   HEPARINUNFRC  --   --  >1.10*  --  >1.10*  --   --   CREATININE 5.61*  --   --  5.68*  --   --  4.00*  TROPONINIHS 478* 462*  --   --   --   --   --      Estimated Creatinine Clearance: 19.5 mL/min (A) (by C-G formula based on SCr of 4 mg/dL (H)).   Medical History: Past Medical History:  Diagnosis Date   Anemia    Anxiety and depression    ATN (acute tubular necrosis) (HCC)    CAD (coronary artery disease)    a.) R/LHC 07/19/2020: 20% pRCA, 30% mRCA, 50% dRCA, 50% RPAV, 40% pLAD, 70% D1, 60% mLAD; LVEDP 19 mmHg; PA 37/16 (26 mmHg); PCWP 13 mmHg. b.) LHC 06/03/2021: EF 45-50%; LVEDP 18 mmHg; 40% pLAD, 60% mLAD, 20% pRCA, 30% mRCA, 70% D1, 30% dRCA, 40% RPDA.   Chronic HFrEF (heart failure with reduced ejection fraction) ([place])    a.) 06/2020 Echo: EF 30-35%, glob HK, GrII DD, nl RV fxn. Mild LAE. b.)  TTE 08/08/2021: EF 40-45%; global HK; mild LVH; GLS -8.6; mild LA dilation.   DVT of axillary vein, acute left ([place]) 05/2021   a.) Tx'd with apixaban   ESRD (end stage renal disease) on dialysis [facility])    a.) M-W-F   History of 2019 novel coronavirus disease (COVID-19) 03/06/2020   a.) s/p Tx with monoclonal Ab infusion    HLD (hyperlipidemia)    Hyperkalemia 12/23/2020   Hypertension    Left carotid artery stenosis 08/18/2021   a.) Carotid Doppler 11/91/4782: 95-62% LICA stenosis   Long term current use of anticoagulant    a.) apixaban for DVT   NICM (nonischemic cardiomyopathy) ([place])    a.) 07/2009 MV: EF 48%; b.) 08/2019 Echo: EF 45-50%. Global HK. Mod LVH. GrIDD; c.) 06/2020 Echo: EF 30-35%, glob HK, mild LVH, GrII DD, nl RV fxn, mild LAE; d.) 06/2020 Cath: nonobs dzs. e.)  LHC 06/03/2021; EF 45-50%; LVEDP 18 mmHg; nonobstructive CAD. f.) b.)  TTE 08/08/2021: EF 40-45%; global HK; mild LVH; GLS -8.6; mild LA dilation.   NSTEMI (non-ST elevated myocardial infarction) ([place]) 07/15/2020   a.) hsTnI trended: 124--> 127--> 176 --> 190 --> 200 ng/L.   Sepsis ([place])    Suicidal  ideations    TIA (transient ischemic attack) 06/2021   Type 2 diabetes mellitus treated with insulin (HCC)     Medications:  (Not in a hospital admission)  Assessment: Pharmacy consulted to dose heparin for ACS/NSTEMI in this 61 year old male admitted with HTN urgency.   Pt was on Eliquis 5 mg PO BID PTA for DVT prophylaxis , last dose on 12/4 at unspecified time.  CrCl = 12.8 ml/min   Date Time aPTT/HL Rate/Comment 12/05 1445 68s / >1.1 Therapeutic x1; 800 un/hr 12/06   0002    56s                  SUBtherapeutic @ 800 units/hr  12/06 1055 66s / 1.01 Therapeutic but borderline, 900>1000 un/hr   Goal of Therapy:  Heparin level 0.3-0.7 units/ml aPTT :  66 - 102  Monitor platelets by anticoagulation protocol: Yes   Plan:  12/06:  aPTT @ 1055 = 66 sec,  LLN goal range. HL still not correlating.  Will order heparin 1000 units IV x1 bolus and increase drip rate to 1000 units/hr.  Will recheck HL and aPTT 8 hrs after rate change.  Will continue to use aPTT to guide dosing until correlating with HL.  Will check CBC daily.   Jimmy Olson 05/24/2022,10:02 AM

## 2022-05-24 NOTE — Progress Notes (Signed)
[facility]  ROUNDING NOTE   Subjective:   Jimmy Olson  is a 61 year old Hispanic male with past medical conditions including anemia, two-vessel CAD, diabetes, depression, DVT with Eliquis, and end-stage renal disease on hemodialysis.  Patient presents to the emergency department with complaints of shortness of breath.  He has been admitted for Shortness of breath [R06.02] Flash pulmonary edema ([place]) [J81.0] Hypertensive urgency [I16.0] Hypertensive emergency [I16.1] Chest pain, unspecified type [R07.9]  Patient is known to our practice and receives outpatient dialysis treatments at [facility] on a TTS schedule, supervised by Dr. Candiss Norse.   Patient seen resting in bed Alert and oriented Seen with wife at bedside Wife states patient had some chest pain overnight.  Denies pain during visit.    Objective:  Vital signs in last 24 hours:  Temp:  [97.7 F (36.5 C)-98 F (36.7 C)] 97.9 F (36.6 C) (12/06 1351) Pulse Rate:  [71-81] 78 (12/06 1351) Resp:  [12-22] 18 (12/06 1351) BP: (135-197)/(79-103) 163/92 (12/06 1351) SpO2:  [9 %-100 %] 98 % (12/06 1351)  Weight change: 11.9 kg Filed Weights   05/22/22 2332 05/23/22 0955 05/23/22 1415  Weight: 67 kg 78.9 kg 77.5 kg    Intake/Output: I/O last 3 completed shifts: In: 294.1 [I.V.:294.1] Out: 0962 [Urine:350; Other:2000]   Intake/Output this shift:  Total I/O In: -  Out: 300 [Urine:300]  Physical Exam: General: NAD  Head: Normocephalic, atraumatic. Moist oral mucosal membranes  Eyes: Anicteric  Lungs:  Clear to auscultation, normal effort, room air  Heart: Regular rate and rhythm  Abdomen:  Soft, nontender  Extremities: Trace peripheral edema.  Neurologic: Nonfocal, moving all four extremities  Skin: No lesions  Access: Right chest PermCath    Basic Metabolic Panel: Recent Labs  Lab 05/18/22 0850 05/19/22 0218 05/22/22 2338 05/23/22 0601 05/24/22 0544  NA 140 135 139 138 136  K 3.6  3.5 4.5 3.7 3.5  CL 107 99 105 106 102  CO2 25 27 25 23 27   GLUCOSE 137* 111* 294* 395* 136*  BUN 52* 29* 51* 58* 28*  CREATININE 4.87* 3.75* 5.61* 5.68* 4.00*  CALCIUM 8.4* 8.6* 8.8* 8.3* 8.1*  MG  --   --  2.1  --   --   PHOS 4.6  --   --   --   --      Liver Function Tests: Recent Labs  Lab 05/18/22 0850  ALBUMIN 3.1*    No results for input(s): "LIPASE", "AMYLASE" in the last 168 hours. No results for input(s): "AMMONIA" in the last 168 hours.  CBC: Recent Labs  Lab 05/17/22 2144 05/18/22 0850 05/19/22 0218 05/22/22 2338 05/23/22 0601 05/24/22 0544  WBC 6.4 4.9 4.6 5.3 5.6 4.8  NEUTROABS 4.4 3.0  --   --   --   --   HGB 10.9* 9.5* 10.6* 9.8* 9.1* 9.9*  HCT 31.1* 27.6* 31.2* 28.4* 27.1* 29.1*  MCV 88.6 90.5 89.9 91.6 91.2 90.1  PLT 190 170 167 198 174 183     Cardiac Enzymes: No results for input(s): "CKTOTAL", "CKMB", "CKMBINDEX", "TROPONINI" in the last 168 hours.  BNP: Invalid input(s): "POCBNP"  CBG: Recent Labs  Lab 05/23/22 0805 05/23/22 1411 05/23/22 1718 05/23/22 2204 05/24/22 1233  GLUCAP 354* 160* 159* 76 289*     Microbiology: Results for orders placed or performed during the hospital encounter of 05/22/22  Resp Panel by RT-PCR (Flu A&B, Covid) Anterior Nasal Swab     Status: None   Collection  Time: 05/22/22 11:58 PM   Specimen: Anterior Nasal Swab  Result Value Ref Range Status   SARS Coronavirus 2 by RT PCR NEGATIVE NEGATIVE Final    Comment: (NOTE) SARS-CoV-2 target nucleic acids are NOT DETECTED.  The SARS-CoV-2 RNA is generally detectable in upper respiratory specimens during the acute phase of infection. The lowest concentration of SARS-CoV-2 viral copies this assay can detect is 138 copies/mL. A negative result does not preclude SARS-Cov-2 infection and should not be used as the sole basis for treatment or other patient management decisions. A negative result may occur with  improper specimen collection/handling,  submission of specimen other than nasopharyngeal swab, presence of viral mutation(s) within the areas targeted by this assay, and inadequate number of viral copies(<138 copies/mL). A negative result must be combined with clinical observations, patient history, and epidemiological information. The expected result is Negative.  Fact Sheet for Patients:  EntrepreneurPulse.com.au  Fact Sheet for Healthcare Providers:  IncredibleEmployment.be  This test is no t yet approved or cleared by the [place] FDA and  has been authorized for detection and/or diagnosis of SARS-CoV-2 by FDA under an Emergency Use Authorization (EUA). This EUA will remain  in effect (meaning this test can be used) for the duration of the COVID-19 declaration under Section 564(b)(1) of the Act, 21 U.S.C.section 360bbb-3(b)(1), unless the authorization is terminated  or revoked sooner.       Influenza A by PCR NEGATIVE NEGATIVE Final   Influenza B by PCR NEGATIVE NEGATIVE Final    Comment: (NOTE) The Xpert Xpress SARS-CoV-2/FLU/RSV plus assay is intended as an aid in the diagnosis of influenza from Nasopharyngeal swab specimens and should not be used as a sole basis for treatment. Nasal washings and aspirates are unacceptable for Xpert Xpress SARS-CoV-2/FLU/RSV testing.  Fact Sheet for Patients: EntrepreneurPulse.com.au  Fact Sheet for Healthcare Providers: IncredibleEmployment.be  This test is not yet approved or cleared by the [place] FDA and has been authorized for detection and/or diagnosis of SARS-CoV-2 by FDA under an Emergency Use Authorization (EUA). This EUA will remain in effect (meaning this test can be used) for the duration of the COVID-19 declaration under Section 564(b)(1) of the Act, 21 U.S.C. section 360bbb-3(b)(1), unless the authorization is terminated or revoked.  Performed at [facility], [address] [place], [place] [address]     Coagulation Studies: Recent Labs    05/23/22 0310  LABPROT 14.2  INR 1.1     Urinalysis: No results for input(s): "COLORURINE", "LABSPEC", "PHURINE", "GLUCOSEU", "HGBUR", "BILIRUBINUR", "KETONESUR", "PROTEINUR", "UROBILINOGEN", "NITRITE", "LEUKOCYTESUR" in the last 72 hours.  Invalid input(s): "APPERANCEUR"    Imaging: DG Chest Port 1 View  Result Date: 05/23/2022 CLINICAL DATA:  Dyspnea EXAM: PORTABLE CHEST 1 VIEW COMPARISON:  05/17/2022 FINDINGS: The lungs are symmetrically well expanded. No pneumothorax or pleural effusion. Mild cardiomegaly is stable. Diffuse interstitial pulmonary edema has progressed slightly in the interval and is now mild-to-moderate in severity. Right internal jugular hemodialysis catheter tip again noted at the superior cavoatrial junction. No acute bone abnormality. IMPRESSION: 1. Progressive interstitial pulmonary edema, now mild-to-moderate in severity. Electronically Signed   By: Fidela Salisbury M.D.   On: 05/23/2022 00:10     Medications:    anticoagulant sodium citrate     heparin 1,000 Units/hr (05/24/22 1230)    carvedilol  25 mg Oral BID WC   Chlorhexidine Gluconate Cloth  6 each Topical Q0600   escitalopram  10 mg Oral QHS   furosemide  80 mg  Intravenous BID WC   hydrALAZINE  100 mg Oral BID   insulin aspart  0-20 Units Subcutaneous TID WC   insulin aspart  0-5 Units Subcutaneous QHS   insulin aspart  3 Units Subcutaneous TID WC   insulin detemir  10 Units Subcutaneous Once   insulin detemir  18 Units Subcutaneous QHS   isosorbide mononitrate  30 mg Oral Daily   losartan  100 mg Oral Daily   pantoprazole  20 mg Oral Daily   rosuvastatin  40 mg Oral Daily   acetaminophen **OR** acetaminophen, albuterol, alteplase, anticoagulant sodium citrate, heparin, lidocaine (PF), lidocaine-prilocaine, magnesium hydroxide, nitroGLYCERIN, ondansetron **OR** ondansetron (ZOFRAN) IV,  pentafluoroprop-tetrafluoroeth, traZODone  Assessment/ Plan:  Mr. Jimmy Olson is a 61 y.o.  male with past medical conditions including anemia, two-vessel CAD, diabetes, depression, DVT with Eliquis, and end-stage renal disease on hemodialysis.  Patient presents to the emergency department with complaints of shortness of breath.  He has been admitted for Shortness of breath [R06.02] Flash pulmonary edema ([place]) [J81.0] Hypertensive urgency [I16.0] Hypertensive emergency [I16.1] Chest pain, unspecified type [R07.9]  [facility]/TTS/right chest PermCath/69.0 kg  End-stage renal disease on hemodialysis.  Received treatment yesterday, UF 2L achieved. Next treatment scheduled for Thursday.   2. Anemia of chronic kidney disease  Lab Results  Component Value Date   HGB 9.9 (L) 05/24/2022    Hemoglobin not at goal.  Patient receives [place] outpatient.   3. Secondary Hyperparathyroidism: with outpatient labs: PTH 331, phosphorus 4.8, calcium 8.8 on 05/02/22.   Lab Results  Component Value Date   CALCIUM 8.1 (L) 05/24/2022   CAION 1.23 09/29/2021   PHOS 4.6 05/18/2022  Calcium decreased but within acceptable range.  Will continue to monitor bone minerals during this admission.    4. Diabetes mellitus type II with chronic kidney disease/renal manifestations: insulin dependent. Home regimen includes NovoLog, Humulog, Levemir. Most recent hemoglobin A1c is 10.1 on 07/14/21.     Glucose remains elevated at times.   5. NSTEMI with CAD. Cardiology planning heart catheterization on Friday.    LOS: 1   12/6/20232:56 PM

## 2022-05-25 DIAGNOSIS — N186 End stage renal disease: Secondary | ICD-10-CM | POA: Diagnosis not present

## 2022-05-25 DIAGNOSIS — J810 Acute pulmonary edema: Secondary | ICD-10-CM

## 2022-05-25 DIAGNOSIS — R0602 Shortness of breath: Secondary | ICD-10-CM

## 2022-05-25 DIAGNOSIS — I5043 Acute on chronic combined systolic (congestive) and diastolic (congestive) heart failure: Secondary | ICD-10-CM | POA: Diagnosis not present

## 2022-05-25 DIAGNOSIS — I214 Non-ST elevation (NSTEMI) myocardial infarction: Secondary | ICD-10-CM | POA: Diagnosis not present

## 2022-05-25 DIAGNOSIS — R079 Chest pain, unspecified: Secondary | ICD-10-CM

## 2022-05-25 DIAGNOSIS — I161 Hypertensive emergency: Secondary | ICD-10-CM

## 2022-05-25 DIAGNOSIS — I160 Hypertensive urgency: Secondary | ICD-10-CM | POA: Diagnosis not present

## 2022-05-25 LAB — GLUCOSE, CAPILLARY
Glucose-Capillary: 67 mg/dL — ABNORMAL LOW (ref 70–99)
Glucose-Capillary: 124 mg/dL — ABNORMAL HIGH (ref 70–99)
Glucose-Capillary: 346 mg/dL — ABNORMAL HIGH (ref 70–99)
Glucose-Capillary: 226 mg/dL — ABNORMAL HIGH (ref 70–99)

## 2022-05-25 LAB — CBC
HCT: 29.1 % — ABNORMAL LOW (ref 39.0–52.0)
Hemoglobin: 10.3 g/dL — ABNORMAL LOW (ref 13.0–17.0)
MCH: 31.5 pg (ref 26.0–34.0)
MCHC: 35.4 g/dL (ref 30.0–36.0)
MCV: 89 fL (ref 80.0–100.0)
Platelets: 202 10*3/uL (ref 150–400)
RBC: 3.27 MIL/uL — ABNORMAL LOW (ref 4.22–5.81)
RDW: 12.3 % (ref 11.5–15.5)
WBC: 5.2 10*3/uL (ref 4.0–10.5)
nRBC: 0 % (ref 0.0–0.2)

## 2022-05-25 LAB — APTT
aPTT: 80 seconds — ABNORMAL HIGH (ref 24–36)
aPTT: 38 seconds — ABNORMAL HIGH (ref 24–36)
aPTT: 63 seconds — ABNORMAL HIGH (ref 24–36)

## 2022-05-25 LAB — RENAL FUNCTION PANEL
Albumin: 3.4 g/dL — ABNORMAL LOW (ref 3.5–5.0)
Anion gap: 8 (ref 5–15)
BUN: 38 mg/dL — ABNORMAL HIGH (ref 6–20)
CO2: 27 mmol/L (ref 22–32)
Calcium: 8.9 mg/dL (ref 8.9–10.3)
Chloride: 101 mmol/L (ref 98–111)
Creatinine, Ser: 5.48 mg/dL — ABNORMAL HIGH (ref 0.61–1.24)
GFR, Estimated: 11 mL/min — ABNORMAL LOW (ref >60)
Glucose, Bld: 77 mg/dL (ref 70–99)
Phosphorus: 5.6 mg/dL — ABNORMAL HIGH (ref 2.5–4.6)
Potassium: 3.5 mmol/L (ref 3.5–5.1)
Sodium: 136 mmol/L (ref 135–145)

## 2022-05-25 LAB — PROTIME-INR
INR: 1 (ref 0.8–1.2)
Prothrombin Time: 12.8 seconds (ref 11.4–15.2)

## 2022-05-25 LAB — HEPARIN LEVEL (UNFRACTIONATED): Heparin Unfractionated: 0.82 IU/mL — ABNORMAL HIGH (ref 0.30–0.70)

## 2022-05-25 MED ORDER — ISOSORBIDE MONONITRATE ER 30 MG PO TB24
30.0000 mg | ORAL_TABLET | Freq: Two times a day (BID) | ORAL | Status: DC
  Administered 2022-05-25 – 2022-05-28 (×6): 30 mg via ORAL
  Filled 2022-05-25 (×6): qty 1

## 2022-05-25 MED ORDER — ASPIRIN 81 MG PO CHEW
81.0000 mg | CHEWABLE_TABLET | ORAL | Status: AC
  Administered 2022-05-26: 81 mg via ORAL
  Filled 2022-05-25: qty 1

## 2022-05-25 MED ORDER — SODIUM CHLORIDE 0.9% FLUSH: 3.0000 mL | INTRAVENOUS | Status: DC | PRN

## 2022-05-25 MED ORDER — ROSUVASTATIN CALCIUM 10 MG PO TABS
10.0000 mg | ORAL_TABLET | Freq: Every day | ORAL | Status: DC
  Administered 2022-05-26 – 2022-05-28 (×3): 10 mg via ORAL
  Filled 2022-05-25 (×3): qty 1

## 2022-05-25 MED ORDER — HEPARIN BOLUS VIA INFUSION
1000.0000 [IU] | Freq: Once | INTRAVENOUS | Status: AC
  Administered 2022-05-26: 1000 [IU] via INTRAVENOUS
  Filled 2022-05-25: qty 1000

## 2022-05-25 MED ORDER — SODIUM CHLORIDE 0.9 % IV SOLN: INTRAVENOUS | Status: DC

## 2022-05-25 MED ORDER — SODIUM CHLORIDE 0.9% FLUSH
3.0000 mL | Freq: Two times a day (BID) | INTRAVENOUS | Status: DC
  Administered 2022-05-25 – 2022-05-27 (×2): 3 mL via INTRAVENOUS

## 2022-05-25 MED ORDER — HEPARIN SODIUM (PORCINE) 1000 UNIT/ML IJ SOLN
INTRAMUSCULAR | Status: AC
  Filled 2022-05-25: qty 10

## 2022-05-25 MED ORDER — SODIUM CHLORIDE 0.9 % IV SOLN: 250.0000 mL | INTRAVENOUS | Status: DC | PRN

## 2022-05-25 NOTE — Progress Notes (Signed)
Received patient in bed to unit.  Alert and oriented.  Informed consent signed and in chart.   Patient stated that he has chest pain 2/10 and has been going on since he got admitted. PA notified without further orders.   Patient tolerated well.  Transported back to the room  Alert, without acute distress.  Hand-off given to patient's nurse.   Access used: [facility] Access issues: none  Total UF removed: 2L Medication(s) given: none Post HD VS: 163/92, Pulse: 76, RR: 15, O2sat: [facility]

## 2022-05-25 NOTE — Progress Notes (Signed)
[facility]  ROUNDING NOTE   Subjective:   Jimmy Olson  is a 61 year old Hispanic male with past medical conditions including anemia, two-vessel CAD, diabetes, depression, DVT with Eliquis, and end-stage renal disease on hemodialysis.  Patient presents to the emergency department with complaints of shortness of breath.  He has been admitted for Shortness of breath [R06.02] Flash pulmonary edema ([place]) [J81.0] Hypertensive urgency [I16.0] Hypertensive emergency [I16.1] Chest pain, unspecified type [R07.9]  Patient is known to our practice and receives outpatient dialysis treatments at [facility] on a TTS schedule, supervised by Dr. Candiss Norse.   Patient seen and evaluated during dialysis   HEMODIALYSIS FLOWSHEET:  Blood Flow Rate (mL/min): 400 mL/min Arterial Pressure (mmHg): -160 mmHg Venous Pressure (mmHg): 190 mmHg TMP (mmHg): 3 mmHg Ultrafiltration Rate (mL/min): 828 mL/min Dialysate Flow Rate (mL/min): 300 ml/min Dialysis Fluid Bolus: Normal Saline Bolus Amount (mL): 300 mL  Tolerating treatment well. Continues to have intermittent chest pain, 3 out of 10 on pain scale.  Objective:  Vital signs in last 24 hours:  Temp:  [97.5 F (36.4 C)-98.8 F (37.1 C)] 97.9 F (36.6 C) (12/07 1554) Pulse Rate:  [67-87] 87 (12/07 1554) Resp:  [10-22] 18 (12/07 1554) BP: (127-195)/(63-97) 127/63 (12/07 1554) SpO2:  [98 %-100 %] 99 % (12/07 1554) Weight:  [63.5 kg-67.5 kg] 63.5 kg (12/07 1254)  Weight change: -11.4 kg Filed Weights   05/25/22 0107 05/25/22 0850 05/25/22 1254  Weight: 67.5 kg 65.5 kg 63.5 kg    Intake/Output: I/O last 3 completed shifts: In: 948.5 [P.O.:600; I.V.:348.5] Out: 1000 [Urine:1000]   Intake/Output this shift:  Total I/O In: 380 [P.O.:360; I.V.:20] Out: 2325 [Urine:325; Other:2000]  Physical Exam: General: NAD  Head: Normocephalic, atraumatic. Moist oral mucosal membranes  Eyes: Anicteric  Lungs:  Clear to auscultation,  normal effort, room air  Heart: Regular rate and rhythm  Abdomen:  Soft, nontender  Extremities: Trace peripheral edema.  Neurologic: Nonfocal, moving all four extremities  Skin: No lesions  Access: Right chest PermCath    Basic Metabolic Panel: Recent Labs  Lab 05/19/22 0218 05/22/22 2338 05/23/22 0601 05/24/22 0544 05/25/22 0717  NA 135 139 138 136 136  K 3.5 4.5 3.7 3.5 3.5  CL 99 105 106 102 101  CO2 27 25 23 27 27   GLUCOSE 111* 294* 395* 136* 77  BUN 29* 51* 58* 28* 38*  CREATININE 3.75* 5.61* 5.68* 4.00* 5.48*  CALCIUM 8.6* 8.8* 8.3* 8.1* 8.9  MG  --  2.1  --   --   --   PHOS  --   --   --   --  5.6*     Liver Function Tests: Recent Labs  Lab 05/25/22 0717  ALBUMIN 3.4*    No results for input(s): "LIPASE", "AMYLASE" in the last 168 hours. No results for input(s): "AMMONIA" in the last 168 hours.  CBC: Recent Labs  Lab 05/19/22 0218 05/22/22 2338 05/23/22 0601 05/24/22 0544 05/25/22 0345  WBC 4.6 5.3 5.6 4.8 5.2  HGB 10.6* 9.8* 9.1* 9.9* 10.3*  HCT 31.2* 28.4* 27.1* 29.1* 29.1*  MCV 89.9 91.6 91.2 90.1 89.0  PLT 167 198 174 183 202     Cardiac Enzymes: No results for input(s): "CKTOTAL", "CKMB", "CKMBINDEX", "TROPONINI" in the last 168 hours.  BNP: Invalid input(s): "POCBNP"  CBG: Recent Labs  Lab 05/24/22 1626 05/24/22 2115 05/25/22 0728 05/25/22 0809 05/25/22 1636  GLUCAP 186* 262* 67* 124* 346*     Microbiology: Results for orders  placed or performed during the hospital encounter of 05/22/22  Resp Panel by RT-PCR (Flu A&B, Covid) Anterior Nasal Swab     Status: None   Collection Time: 05/22/22 11:58 PM   Specimen: Anterior Nasal Swab  Result Value Ref Range Status   SARS Coronavirus 2 by RT PCR NEGATIVE NEGATIVE Final    Comment: (NOTE) SARS-CoV-2 target nucleic acids are NOT DETECTED.  The SARS-CoV-2 RNA is generally detectable in upper respiratory specimens during the acute phase of infection. The lowest concentration of  SARS-CoV-2 viral copies this assay can detect is 138 copies/mL. A negative result does not preclude SARS-Cov-2 infection and should not be used as the sole basis for treatment or other patient management decisions. A negative result may occur with  improper specimen collection/handling, submission of specimen other than nasopharyngeal swab, presence of viral mutation(s) within the areas targeted by this assay, and inadequate number of viral copies(<138 copies/mL). A negative result must be combined with clinical observations, patient history, and epidemiological information. The expected result is Negative.  Fact Sheet for Patients:  EntrepreneurPulse.com.au  Fact Sheet for Healthcare Providers:  IncredibleEmployment.be  This test is no t yet approved or cleared by the [place] FDA and  has been authorized for detection and/or diagnosis of SARS-CoV-2 by FDA under an Emergency Use Authorization (EUA). This EUA will remain  in effect (meaning this test can be used) for the duration of the COVID-19 declaration under Section 564(b)(1) of the Act, 21 U.S.C.section 360bbb-3(b)(1), unless the authorization is terminated  or revoked sooner.       Influenza A by PCR NEGATIVE NEGATIVE Final   Influenza B by PCR NEGATIVE NEGATIVE Final    Comment: (NOTE) The Xpert Xpress SARS-CoV-2/FLU/RSV plus assay is intended as an aid in the diagnosis of influenza from Nasopharyngeal swab specimens and should not be used as a sole basis for treatment. Nasal washings and aspirates are unacceptable for Xpert Xpress SARS-CoV-2/FLU/RSV testing.  Fact Sheet for Patients: EntrepreneurPulse.com.au  Fact Sheet for Healthcare Providers: IncredibleEmployment.be  This test is not yet approved or cleared by the [place] FDA and has been authorized for detection and/or diagnosis of SARS-CoV-2 by FDA under an Emergency Use  Authorization (EUA). This EUA will remain in effect (meaning this test can be used) for the duration of the COVID-19 declaration under Section 564(b)(1) of the Act, 21 U.S.C. section 360bbb-3(b)(1), unless the authorization is terminated or revoked.  Performed at [facility], [address] [place], [place] [address]     Coagulation Studies: Recent Labs    05/23/22 0310  LABPROT 14.2  INR 1.1     Urinalysis: No results for input(s): "COLORURINE", "LABSPEC", "PHURINE", "GLUCOSEU", "HGBUR", "BILIRUBINUR", "KETONESUR", "PROTEINUR", "UROBILINOGEN", "NITRITE", "LEUKOCYTESUR" in the last 72 hours.  Invalid input(s): "APPERANCEUR"    Imaging: No results found.   Medications:    heparin 950 Units/hr (05/24/22 2107)    carvedilol  25 mg Oral BID WC   Chlorhexidine Gluconate Cloth  6 each Topical Q0600   escitalopram  10 mg Oral QHS   furosemide  80 mg Intravenous BID WC   heparin sodium (porcine)       hydrALAZINE  100 mg Oral BID   insulin aspart  0-5 Units Subcutaneous QHS   insulin aspart  0-9 Units Subcutaneous TID WC   insulin detemir  18 Units Subcutaneous QHS   isosorbide mononitrate  30 mg Oral BID   losartan  100 mg Oral Daily   pantoprazole  20 mg  Oral Daily   [START ON 05/26/2022] rosuvastatin  10 mg Oral Daily   acetaminophen **OR** acetaminophen, albuterol, heparin sodium (porcine), magnesium hydroxide, nitroGLYCERIN, ondansetron **OR** ondansetron (ZOFRAN) IV, traZODone  Assessment/ Plan:  Mr. Jimmy Olson is a 61 y.o.  male with past medical conditions including anemia, two-vessel CAD, diabetes, depression, DVT with Eliquis, and end-stage renal disease on hemodialysis.  Patient presents to the emergency department with complaints of shortness of breath.  He has been admitted for Shortness of breath [R06.02] Flash pulmonary edema ([place]) [J81.0] Hypertensive urgency [I16.0] Hypertensive emergency [I16.1] Chest pain, unspecified type  [R07.9]  [facility]/TTS/right chest PermCath/69.0 kg  End-stage renal disease on hemodialysis.  Receiving dialysis today, UF goal 1.5 to 2 L as tolerated.  Next treatment scheduled for Saturday.  2. Anemia of chronic kidney disease  Lab Results  Component Value Date   HGB 10.3 (L) 05/25/2022    Patient receives [place] outpatient.  Hemoglobin remains on target.  3. Secondary Hyperparathyroidism: with outpatient labs: PTH 331, phosphorus 4.8, calcium 8.8 on 05/02/22.   Lab Results  Component Value Date   CALCIUM 8.9 05/25/2022   CAION 1.23 09/29/2021   PHOS 5.6 (H) 05/25/2022  Calcium within desired range.  Phosphorus slightly elevated.  Will continue to monitor.  4. Diabetes mellitus type II with chronic kidney disease/renal manifestations: insulin dependent. Home regimen includes NovoLog, Humulog, Levemir. Most recent hemoglobin A1c is 10.1 on 07/14/21.  Appears hypoglycemic this morning to 67 however corrected to 124.  5. NSTEMI with CAD. Cardiology planning heart catheterization on Friday.    LOS: 2   12/7/20235:08 PM

## 2022-05-25 NOTE — Progress Notes (Signed)
Progress Note  Patient Name: Jimmy Olson Date of Encounter: 05/25/2022  Primary Cardiologist: Rockey Situ  Subjective   Seen in HD. No angina or dyspnea. Last dose of PTA Eliquis on 05/23/2022. Planning for [facility] tomorrow.   Inpatient Medications    Scheduled Meds:  heparin sodium (porcine)       carvedilol  25 mg Oral BID WC   Chlorhexidine Gluconate Cloth  6 each Topical Q0600   escitalopram  10 mg Oral QHS   furosemide  80 mg Intravenous BID WC   hydrALAZINE  100 mg Oral BID   insulin aspart  0-5 Units Subcutaneous QHS   insulin aspart  0-9 Units Subcutaneous TID WC   insulin detemir  18 Units Subcutaneous QHS   isosorbide mononitrate  30 mg Oral Daily   losartan  100 mg Oral Daily   pantoprazole  20 mg Oral Daily   rosuvastatin  40 mg Oral Daily   Continuous Infusions:  anticoagulant sodium citrate     heparin 950 Units/hr (05/24/22 2107)   PRN Meds: heparin sodium (porcine), acetaminophen **OR** acetaminophen, albuterol, alteplase, anticoagulant sodium citrate, heparin, lidocaine (PF), lidocaine-prilocaine, magnesium hydroxide, nitroGLYCERIN, ondansetron **OR** ondansetron (ZOFRAN) IV, pentafluoroprop-tetrafluoroeth, traZODone   Vital Signs    Vitals:   05/25/22 0850 05/25/22 0859 05/25/22 0930 05/25/22 1000  BP: (!) 186/84 (!) 195/90 (!) 176/90 (!) 156/97  Pulse: 82 86 76 81  Resp: (!) 22 (!) 22 18 16   Temp: 98.8 F (37.1 C)     TempSrc:      SpO2: 100% 99% 100% 100%  Weight: 65.5 kg     Height:        Intake/Output Summary (Last 24 hours) at 05/25/2022 1022 Last data filed at 05/25/2022 0700 Gross per 24 hour  Intake 948.54 ml  Output 1000 ml  Net -51.46 ml   Filed Weights   05/23/22 1415 05/25/22 0107 05/25/22 0850  Weight: 77.5 kg 67.5 kg 65.5 kg    Telemetry    SR, 70s to 80s bpm - Personally Reviewed  ECG    No new tracings - Personally Reviewed  Physical Exam   GEN: No acute distress.   Neck: No JVD. Cardiac: RRR, no murmurs, rubs, or  gallops.  Respiratory: Clear to auscultation bilaterally.  GI: Soft, nontender, non-distended.   MS: No edema; No deformity. Neuro:  Alert and oriented x 3; Nonfocal.  Psych: Normal affect.  Labs    Chemistry Recent Labs  Lab 05/23/22 0601 05/24/22 0544 05/25/22 0717  NA 138 136 136  K 3.7 3.5 3.5  CL 106 102 101  CO2 23 27 27   GLUCOSE 395* 136* 77  BUN 58* 28* 38*  CREATININE 5.68* 4.00* 5.48*  CALCIUM 8.3* 8.1* 8.9  ALBUMIN  --   --  3.4*  GFRNONAA 11* 16* 11*  ANIONGAP 9 7 8      Hematology Recent Labs  Lab 05/23/22 0601 05/24/22 0544 05/25/22 0345  WBC 5.6 4.8 5.2  RBC 2.97* 3.23* 3.27*  HGB 9.1* 9.9* 10.3*  HCT 27.1* 29.1* 29.1*  MCV 91.2 90.1 89.0  MCH 30.6 30.7 31.5  MCHC 33.6 34.0 35.4  RDW 12.6 12.4 12.3  PLT 174 183 202    Cardiac EnzymesNo results for input(s): "TROPONINI" in the last 168 hours. No results for input(s): "TROPIPOC" in the last 168 hours.   BNP Recent Labs  Lab 05/22/22 2338  BNP 3,889.8*     DDimer No results for input(s): "DDIMER" in the last 168 hours.  Radiology    No results found.  Cardiac Studies   2D echo 05/18/2022: 1. Left ventricular ejection fraction, by estimation, is 40 to 45%. The  left ventricle has mildly decreased function. The left ventricle  demonstrates global hypokinesis. There is mild left ventricular  hypertrophy. Small region of noncompaction noted in  the apical region, less likely mural thrombus (noted on prior studies).  Left ventricular diastolic parameters are consistent with Grade II  diastolic dysfunction (pseudonormalization).   2. Right ventricular systolic function is normal. The right ventricular  size is normal. Tricuspid regurgitation signal is inadequate for assessing  PA pressure.   3. The mitral valve is normal in structure. No evidence of mitral valve  regurgitation. No evidence of mitral stenosis.   4. The aortic valve was not well visualized. Aortic valve regurgitation  is  not visualized. No aortic stenosis is present.   5. The inferior vena cava is normal in size with greater than 50%  respiratory variability, suggesting right atrial pressure of 3 mmHg.   Patient Profile     61 y.o. male with history of CAD, chronic combined systolic and diastolic CHF with mixed ICM and NICM, ESRD on HD (TTS), TIA, DVT on Eliquis, DM2, HTN, HLD, and anemia of chronic disease who we are seeing for NSTEMI.  Assessment & Plan    1. Acute on chronic combined systolic and diastolic CHF with mixed ICM and NICM: -Volume up and managed with HD -IV Lasix on non-HD days, consider torsemide at discharge for non-HD days -Planning for [facility] tomorrow to further understand hemodynamics  -Coreg, losartan, hydralazine, Imdur  2. CAD involving the native coronary arteries with NSTEMI: -Chest pain free -Troponin peaked at 478, now down trending -Planning for [facility] tomorrow -NPO at midnight -Coreg, Crestor, Imdur  3. History of DVT: -Last dose of Eliquis in the evening of 05/21/2022 -Heparin gtt while admitted  4. ESRD: -HD per nephrology   5. HLD: -LDL 45 in 09/2021 -PTA Crestor   6. HTN: -Blood pressure running on the higher side -Permissive BP with HD -Coreg, losartan, Imdur -Monitor   Shared Decision Making/Informed Consent{  The risks [stroke (1 in 1000), death (1 in 1000), kidney failure [usually temporary] (1 in 500), bleeding (1 in 200), allergic reaction [possibly serious] (1 in 200)], benefits (diagnostic support and management of coronary artery disease) and alternatives of a cardiac catheterization were discussed in detail with Jimmy Olson and he is willing to proceed.   For questions or updates, please contact [place] Please consult [email or web address] for contact info under Cardiology/STEMI.    Signed, Christell Faith, PA-C [place] Pager: [phone] 05/25/2022, 10:22 AM

## 2022-05-25 NOTE — H&P (View-Only) (Signed)
Progress Note  Patient Name: Jimmy Olson Date of Encounter: 05/25/2022  Primary Cardiologist: Rockey Situ  Subjective   Seen in HD. No angina or dyspnea. Last dose of PTA Eliquis on 05/23/2022. Planning for [facility] tomorrow.   Inpatient Medications    Scheduled Meds:  heparin sodium (porcine)       carvedilol  25 mg Oral BID WC   Chlorhexidine Gluconate Cloth  6 each Topical Q0600   escitalopram  10 mg Oral QHS   furosemide  80 mg Intravenous BID WC   hydrALAZINE  100 mg Oral BID   insulin aspart  0-5 Units Subcutaneous QHS   insulin aspart  0-9 Units Subcutaneous TID WC   insulin detemir  18 Units Subcutaneous QHS   isosorbide mononitrate  30 mg Oral Daily   losartan  100 mg Oral Daily   pantoprazole  20 mg Oral Daily   rosuvastatin  40 mg Oral Daily   Continuous Infusions:  anticoagulant sodium citrate     heparin 950 Units/hr (05/24/22 2107)   PRN Meds: heparin sodium (porcine), acetaminophen **OR** acetaminophen, albuterol, alteplase, anticoagulant sodium citrate, heparin, lidocaine (PF), lidocaine-prilocaine, magnesium hydroxide, nitroGLYCERIN, ondansetron **OR** ondansetron (ZOFRAN) IV, pentafluoroprop-tetrafluoroeth, traZODone   Vital Signs    Vitals:   05/25/22 0850 05/25/22 0859 05/25/22 0930 05/25/22 1000  BP: (!) 186/84 (!) 195/90 (!) 176/90 (!) 156/97  Pulse: 82 86 76 81  Resp: (!) 22 (!) 22 18 16   Temp: 98.8 F (37.1 C)     TempSrc:      SpO2: 100% 99% 100% 100%  Weight: 65.5 kg     Height:        Intake/Output Summary (Last 24 hours) at 05/25/2022 1022 Last data filed at 05/25/2022 0700 Gross per 24 hour  Intake 948.54 ml  Output 1000 ml  Net -51.46 ml   Filed Weights   05/23/22 1415 05/25/22 0107 05/25/22 0850  Weight: 77.5 kg 67.5 kg 65.5 kg    Telemetry    SR, 70s to 80s bpm - Personally Reviewed  ECG    No new tracings - Personally Reviewed  Physical Exam   GEN: No acute distress.   Neck: No JVD. Cardiac: RRR, no murmurs, rubs, or  gallops.  Respiratory: Clear to auscultation bilaterally.  GI: Soft, nontender, non-distended.   MS: No edema; No deformity. Neuro:  Alert and oriented x 3; Nonfocal.  Psych: Normal affect.  Labs    Chemistry Recent Labs  Lab 05/23/22 0601 05/24/22 0544 05/25/22 0717  NA 138 136 136  K 3.7 3.5 3.5  CL 106 102 101  CO2 23 27 27   GLUCOSE 395* 136* 77  BUN 58* 28* 38*  CREATININE 5.68* 4.00* 5.48*  CALCIUM 8.3* 8.1* 8.9  ALBUMIN  --   --  3.4*  GFRNONAA 11* 16* 11*  ANIONGAP 9 7 8      Hematology Recent Labs  Lab 05/23/22 0601 05/24/22 0544 05/25/22 0345  WBC 5.6 4.8 5.2  RBC 2.97* 3.23* 3.27*  HGB 9.1* 9.9* 10.3*  HCT 27.1* 29.1* 29.1*  MCV 91.2 90.1 89.0  MCH 30.6 30.7 31.5  MCHC 33.6 34.0 35.4  RDW 12.6 12.4 12.3  PLT 174 183 202    Cardiac EnzymesNo results for input(s): "TROPONINI" in the last 168 hours. No results for input(s): "TROPIPOC" in the last 168 hours.   BNP Recent Labs  Lab 05/22/22 2338  BNP 3,889.8*     DDimer No results for input(s): "DDIMER" in the last 168 hours.  Radiology    No results found.  Cardiac Studies   2D echo 05/18/2022: 1. Left ventricular ejection fraction, by estimation, is 40 to 45%. The  left ventricle has mildly decreased function. The left ventricle  demonstrates global hypokinesis. There is mild left ventricular  hypertrophy. Small region of noncompaction noted in  the apical region, less likely mural thrombus (noted on prior studies).  Left ventricular diastolic parameters are consistent with Grade II  diastolic dysfunction (pseudonormalization).   2. Right ventricular systolic function is normal. The right ventricular  size is normal. Tricuspid regurgitation signal is inadequate for assessing  PA pressure.   3. The mitral valve is normal in structure. No evidence of mitral valve  regurgitation. No evidence of mitral stenosis.   4. The aortic valve was not well visualized. Aortic valve regurgitation  is  not visualized. No aortic stenosis is present.   5. The inferior vena cava is normal in size with greater than 50%  respiratory variability, suggesting right atrial pressure of 3 mmHg.   Patient Profile     62 y.o. male with history of CAD, chronic combined systolic and diastolic CHF with mixed ICM and NICM, ESRD on HD (TTS), TIA, DVT on Eliquis, DM2, HTN, HLD, and anemia of chronic disease who we are seeing for NSTEMI.  Assessment & Plan    1. Acute on chronic combined systolic and diastolic CHF with mixed ICM and NICM: -Volume up and managed with HD -IV Lasix on non-HD days, consider torsemide at discharge for non-HD days -Planning for [facility] tomorrow to further understand hemodynamics  -Coreg, losartan, hydralazine, Imdur  2. CAD involving the native coronary arteries with NSTEMI: -Chest pain free -Troponin peaked at 478, now down trending -Planning for [facility] tomorrow -NPO at midnight -Coreg, Crestor, Imdur  3. History of DVT: -Last dose of Eliquis in the evening of 05/21/2022 -Heparin gtt while admitted  4. ESRD: -HD per nephrology   5. HLD: -LDL 45 in 09/2021 -PTA Crestor   6. HTN: -Blood pressure running on the higher side -Permissive BP with HD -Coreg, losartan, Imdur -Monitor   Shared Decision Making/Informed Consent{  The risks [stroke (1 in 1000), death (1 in 1000), kidney failure [usually temporary] (1 in 500), bleeding (1 in 200), allergic reaction [possibly serious] (1 in 200)], benefits (diagnostic support and management of coronary artery disease) and alternatives of a cardiac catheterization were discussed in detail with Mr. Premo and he is willing to proceed.   For questions or updates, please contact [place] Please consult [email or web address] for contact info under Cardiology/STEMI.    Signed, Christell Faith, PA-C [place] Pager: [phone] 05/25/2022, 10:22 AM

## 2022-05-25 NOTE — Progress Notes (Signed)
ANTICOAGULATION CONSULT NOTE  Pharmacy Consult for IV Heparin  Indication: chest pain/ACS  Patient Measurements: Height: 5\' 6"  (167.6 cm) Weight: 63.5 kg (139 lb 15.9 oz) IBW/kg (Calculated) : 63.8 Heparin Dosing Weight: 67 kg   Labs: Recent Labs    05/23/22 0209 05/23/22 0310 05/23/22 0310 05/23/22 0601 05/23/22 1445 05/24/22 0002 05/24/22 0544 05/24/22 1055 05/24/22 1955 05/25/22 0345 05/25/22 0717 05/25/22 1431 05/25/22 2233  HGB  --   --    < > 9.1*  --   --  9.9*  --   --  10.3*  --   --   --   HCT  --   --   --  27.1*  --   --  29.1*  --   --  29.1*  --   --   --   PLT  --   --   --  174  --   --  183  --   --  202  --   --   --   APTT  --  29   < >  --  68*   < >  --  66*   < > 80*  --  38* 63*  LABPROT  --  14.2  --   --   --   --   --   --   --   --   --   --  12.8  INR  --  1.1  --   --   --   --   --   --   --   --   --   --  1.0  HEPARINUNFRC  --  >1.10*   < >  --  >1.10*  --   --  1.01*  --  0.82*  --   --   --   CREATININE  --   --   --  5.68*  --   --  4.00*  --   --   --  5.48*  --   --   TROPONINIHS 462*  --   --   --   --   --   --   --   --   --   --   --   --    < > = values in this interval not displayed.     Estimated Creatinine Clearance: 13 mL/min (A) (by C-G formula based on SCr of 5.48 mg/dL (H)).   Medical History: Past Medical History:  Diagnosis Date   Anemia    Anxiety and depression    ATN (acute tubular necrosis) (HCC)    CAD (coronary artery disease)    a.) R/LHC 07/19/2020: 20% pRCA, 30% mRCA, 50% dRCA, 50% RPAV, 40% pLAD, 70% D1, 60% mLAD; LVEDP 19 mmHg; PA 37/16 (26 mmHg); PCWP 13 mmHg. b.) LHC 06/03/2021: EF 45-50%; LVEDP 18 mmHg; 40% pLAD, 60% mLAD, 20% pRCA, 30% mRCA, 70% D1, 30% dRCA, 40% RPDA.   Chronic HFrEF (heart failure with reduced ejection fraction) ([place])    a.) 06/2020 Echo: EF 30-35%, glob HK, GrII DD, nl RV fxn. Mild LAE. b.)  TTE 08/08/2021: EF 40-45%; global HK; mild LVH; GLS -8.6; mild LA dilation.   DVT of  axillary vein, acute left ([place]) 05/2021   a.) Tx'd with apixaban   ESRD (end stage renal disease) on dialysis [facility])    a.) M-W-F   History of 2019 novel coronavirus disease (COVID-19) 03/06/2020   a.) s/p Tx with monoclonal  Ab infusion   HLD (hyperlipidemia)    Hyperkalemia 12/23/2020   Hypertension    Left carotid artery stenosis 08/18/2021   a.) Carotid Doppler 74/01/1447: 18-56% LICA stenosis   Long term current use of anticoagulant    a.) apixaban for DVT   NICM (nonischemic cardiomyopathy) ([place])    a.) 07/2009 MV: EF 48%; b.) 08/2019 Echo: EF 45-50%. Global HK. Mod LVH. GrIDD; c.) 06/2020 Echo: EF 30-35%, glob HK, mild LVH, GrII DD, nl RV fxn, mild LAE; d.) 06/2020 Cath: nonobs dzs. e.)  LHC 06/03/2021; EF 45-50%; LVEDP 18 mmHg; nonobstructive CAD. f.) b.)  TTE 08/08/2021: EF 40-45%; global HK; mild LVH; GLS -8.6; mild LA dilation.   NSTEMI (non-ST elevated myocardial infarction) ([place]) 07/15/2020   a.) hsTnI trended: 124--> 127--> 176 --> 190 --> 200 ng/L.   Sepsis ([place])    Suicidal ideations    TIA (transient ischemic attack) 06/2021   Type 2 diabetes mellitus treated with insulin [facility])    Assessment: Pharmacy consulted to dose heparin for ACS/NSTEMI in this 61 year old male admitted with HTN urgency. Pt was on Eliquis 5 mg PO BID PTA for DVT prophylaxis, last dose on 12/4 at unspecified time.   Date Time aPTT/HL Rate/Comment 12/05 1445 68 / >1.1 Thera x1; 800 un/hr 12/06   0002    56                  Subthera; 800 units/hr  12/06 1055 66 / 1.01 Therapeutic but borderline, 900 > 1000 un/hr  12/06 1955 106  Suprathera 12/07   0345     80 /  0.82       Thera x 1, HL elevated 12/07 1431 38  Subthera; discussed with RN - infusion ran dry during HD and was re-started at 1345 12/07   2233        63               SUBtherapeutic   Goal of Therapy:  Heparin level 0.3-0.7 units/ml aPTT :  66 - 102  Monitor platelets by anticoagulation protocol: Yes   Plan:  12/7:  APTT @ 2233 = 63,  SUBtherapeutic  Will order heparin 1000 units IV X 1 bolus and increase drip rate to 1050 units/hr.  Will recheck aPTT and HL 8 hrs after rate change --Daily CBC per protocol while on IV heparin  Ernesha Ramone D 05/25/2022,11:39 PM

## 2022-05-25 NOTE — Progress Notes (Signed)
ANTICOAGULATION CONSULT NOTE - Initial Consult  Pharmacy Consult for Heparin  Indication: chest pain/ACS  No Known Allergies  Patient Measurements: Height: 5\' 6"  (167.6 cm) Weight: 67.5 kg (148 lb 13 oz) IBW/kg (Calculated) : 63.8 Heparin Dosing Weight: 67 kg   Vital Signs: Temp: 98.5 F (36.9 C) (12/07 0318) Temp Source: Oral (12/07 0318) BP: 147/77 (12/07 0318) Pulse Rate: 78 (12/07 0318)  Labs: Recent Labs    05/22/22 2338 05/22/22 2338 05/23/22 0209 05/23/22 0310 05/23/22 0601 05/23/22 1445 05/24/22 0002 05/24/22 0544 05/24/22 1055 05/24/22 1955 05/25/22 0345  HGB 9.8*  --   --   --  9.1*  --   --  9.9*  --   --  10.3*  HCT 28.4*  --   --   --  27.1*  --   --  29.1*  --   --  29.1*  PLT 198  --   --   --  174  --   --  183  --   --  202  APTT  --    < >  --  29  --  68*   < >  --  66* 106* 80*  LABPROT  --   --   --  14.2  --   --   --   --   --   --   --   INR  --   --   --  1.1  --   --   --   --   --   --   --   HEPARINUNFRC  --    < >  --  >1.10*  --  >1.10*  --   --  1.01*  --  0.82*  CREATININE 5.61*  --   --   --  5.68*  --   --  4.00*  --   --   --   TROPONINIHS 478*  --  462*  --   --   --   --   --   --   --   --    < > = values in this interval not displayed.     Estimated Creatinine Clearance: 17.9 mL/min (A) (by C-G formula based on SCr of 4 mg/dL (H)).   Medical History: Past Medical History:  Diagnosis Date   Anemia    Anxiety and depression    ATN (acute tubular necrosis) (HCC)    CAD (coronary artery disease)    a.) R/LHC 07/19/2020: 20% pRCA, 30% mRCA, 50% dRCA, 50% RPAV, 40% pLAD, 70% D1, 60% mLAD; LVEDP 19 mmHg; PA 37/16 (26 mmHg); PCWP 13 mmHg. b.) LHC 06/03/2021: EF 45-50%; LVEDP 18 mmHg; 40% pLAD, 60% mLAD, 20% pRCA, 30% mRCA, 70% D1, 30% dRCA, 40% RPDA.   Chronic HFrEF (heart failure with reduced ejection fraction) ([place])    a.) 06/2020 Echo: EF 30-35%, glob HK, GrII DD, nl RV fxn. Mild LAE. b.)  TTE 08/08/2021: EF 40-45%; global  HK; mild LVH; GLS -8.6; mild LA dilation.   DVT of axillary vein, acute left ([place]) 05/2021   a.) Tx'd with apixaban   ESRD (end stage renal disease) on dialysis [facility])    a.) M-W-F   History of 2019 novel coronavirus disease (COVID-19) 03/06/2020   a.) s/p Tx with monoclonal Ab infusion   HLD (hyperlipidemia)    Hyperkalemia 12/23/2020   Hypertension    Left carotid artery stenosis 08/18/2021   a.) Carotid Doppler 04/12/8526: 78-24% LICA stenosis  Long term current use of anticoagulant    a.) apixaban for DVT   NICM (nonischemic cardiomyopathy) ([place])    a.) 07/2009 MV: EF 48%; b.) 08/2019 Echo: EF 45-50%. Global HK. Mod LVH. GrIDD; c.) 06/2020 Echo: EF 30-35%, glob HK, mild LVH, GrII DD, nl RV fxn, mild LAE; d.) 06/2020 Cath: nonobs dzs. e.)  LHC 06/03/2021; EF 45-50%; LVEDP 18 mmHg; nonobstructive CAD. f.) b.)  TTE 08/08/2021: EF 40-45%; global HK; mild LVH; GLS -8.6; mild LA dilation.   NSTEMI (non-ST elevated myocardial infarction) ([place]) 07/15/2020   a.) hsTnI trended: 124--> 127--> 176 --> 190 --> 200 ng/L.   Sepsis ([place])    Suicidal ideations    TIA (transient ischemic attack) 06/2021   Type 2 diabetes mellitus treated with insulin (HCC)     Medications:  Medications Prior to Admission  Medication Sig Dispense Refill Last Dose   apixaban (ELIQUIS) 5 MG TABS tablet TAKE 1 TABLET BY MOUTH TWICE DAILY. 180 tablet 1 05/22/2022   carvedilol (COREG) 25 MG tablet Take 1 tablet (25 mg total) by mouth 2 (two) times daily. 180 tablet 3 05/22/2022   escitalopram (LEXAPRO) 10 MG tablet Take 1 tablet (10 mg total) by mouth at bedtime. 30 tablet 0 05/21/2022 at 2200   hydrALAZINE (APRESOLINE) 100 MG tablet Take 1 tablet (100 mg total) by mouth 2 (two) times daily. 60 tablet 0 05/22/2022   insulin detemir (LEVEMIR FLEXPEN) 100 UNIT/ML FlexPen Inject 20 Units into the skin once daily at bedtime. (Patient taking differently: Inject 18 Units into the skin at bedtime.) 15 mL 5 05/21/2022 at 2200   Insulin  Pen Needle 32G X 4 MM MISC USE AS DIRECTED 100 each 0 supply   isosorbide mononitrate (IMDUR) 30 MG 24 hr tablet Take 1 tablet (30 mg total) by mouth in the morning and at bedtime. 90 tablet 3 05/22/2022   losartan (COZAAR) 100 MG tablet Take 1 tablet (100 mg total) by mouth daily. 30 tablet 0 05/21/2022 at 2200   NOVOLOG FLEXPEN 100 UNIT/ML FlexPen Inject 0-15 Units into the skin 3 (three) times daily with meals. 0-15 units tid 0-5 units qhs   05/22/2022   Nutritional Supplements (FEEDING SUPPLEMENT, NEPRO CARB STEADY,) LIQD Take 237 mLs by mouth 3 (three) times daily between meals.  0 Past Week   pantoprazole (PROTONIX) 20 MG tablet Take 1 tablet (20 mg total) by mouth daily. 30 tablet 0 05/22/2022   rosuvastatin (CRESTOR) 40 MG tablet Take 1 tablet (40 mg total) by mouth daily. 30 tablet 0 05/21/2022 at 2200   torsemide (DEMADEX) 20 MG tablet Take 1 tablet (20 mg total) by mouth daily. Takes on Sat and Sun 30 tablet 0 Past Week   acetaminophen (TYLENOL) 325 MG tablet Take 650 mg by mouth every 6 (six) hours as needed.   prn at prn   albuterol (PROVENTIL HFA) 108 (90 Base) MCG/ACT inhaler INHALE 2 PUFFS INTO THE LUNGS ONCE EVERY 6 HOURS AS NEEDED FOR WHEEZING OR SHORTNESS OF BREATH. 6.7 g 10 prn at prn   nitroGLYCERIN (NITROSTAT) 0.4 MG SL tablet Place 1 tablet (0.4 mg total) under the tongue every 5 (five) minutes as needed for chest pain. (Patient not taking: Reported on 05/22/2022) 25 tablet 0 Not Taking    Assessment: Pharmacy consulted to dose heparin for ACS/NSTEMI in this 61 year old male admitted with HTN urgency.   Pt was on Eliquis 5 mg PO BID PTA for DVT prophylaxis , last dose on 12/4 at unspecified  time.  CrCl = 12.8 ml/min   Date Time aPTT/HL Rate/Comment 12/05 1445 68s / >1.1 Therapeutic x1; 800 un/hr 12/06   0002    56s                  SUBtherapeutic @ 800 units/hr  12/06 1055 66s / 1.01 Therapeutic but borderline, 900>1000 un/hr  12/06 1955 106  Supratherapeutic.  12/07   0345      80 /  0.82       aPTT therapeutic X 1 , HL elevated  Goal of Therapy:  Heparin level 0.3-0.7 units/ml aPTT :  66 - 102  Monitor platelets by anticoagulation protocol: Yes   Plan:  12/7 @ 0345:  aPTT = 80,  HL = 0.82 - aPTT therapeutic X 1 but HL still elevated - Will continue pt on current rate and recheck aPTT on 12/7 @ 1200. - Will recheck HL on 12/8 with AM labs.  Once aPTT and heparin level correlate switch to heparin level monitoring.    Sadie Pickar D, PharmD 05/25/2022,5:48 AM

## 2022-05-25 NOTE — Progress Notes (Signed)
ANTICOAGULATION CONSULT NOTE  Pharmacy Consult for IV Heparin  Indication: chest pain/ACS  Patient Measurements: Height: 5\' 6"  (167.6 cm) Weight: 63.5 kg (139 lb 15.9 oz) IBW/kg (Calculated) : 63.8 Heparin Dosing Weight: 67 kg   Labs: Recent Labs    05/22/22 2338 05/22/22 2338 05/23/22 0209 05/23/22 0310 05/23/22 0601 05/23/22 1445 05/24/22 0002 05/24/22 0544 05/24/22 1055 05/24/22 1955 05/25/22 0345 05/25/22 0717 05/25/22 1431  HGB 9.8*  --   --   --  9.1*  --   --  9.9*  --   --  10.3*  --   --   HCT 28.4*  --   --   --  27.1*  --   --  29.1*  --   --  29.1*  --   --   PLT 198  --   --   --  174  --   --  183  --   --  202  --   --   APTT  --    < >  --  29  --  68*   < >  --  66* 106* 80*  --  38*  LABPROT  --   --   --  14.2  --   --   --   --   --   --   --   --   --   INR  --   --   --  1.1  --   --   --   --   --   --   --   --   --   HEPARINUNFRC  --    < >  --  >1.10*  --  >1.10*  --   --  1.01*  --  0.82*  --   --   CREATININE 5.61*  --   --   --  5.68*  --   --  4.00*  --   --   --  5.48*  --   TROPONINIHS 478*  --  462*  --   --   --   --   --   --   --   --   --   --    < > = values in this interval not displayed.     Estimated Creatinine Clearance: 13 mL/min (A) (by C-G formula based on SCr of 5.48 mg/dL (H)).   Medical History: Past Medical History:  Diagnosis Date   Anemia    Anxiety and depression    ATN (acute tubular necrosis) (HCC)    CAD (coronary artery disease)    a.) R/LHC 07/19/2020: 20% pRCA, 30% mRCA, 50% dRCA, 50% RPAV, 40% pLAD, 70% D1, 60% mLAD; LVEDP 19 mmHg; PA 37/16 (26 mmHg); PCWP 13 mmHg. b.) LHC 06/03/2021: EF 45-50%; LVEDP 18 mmHg; 40% pLAD, 60% mLAD, 20% pRCA, 30% mRCA, 70% D1, 30% dRCA, 40% RPDA.   Chronic HFrEF (heart failure with reduced ejection fraction) ([place])    a.) 06/2020 Echo: EF 30-35%, glob HK, GrII DD, nl RV fxn. Mild LAE. b.)  TTE 08/08/2021: EF 40-45%; global HK; mild LVH; GLS -8.6; mild LA dilation.   DVT of  axillary vein, acute left ([place]) 05/2021   a.) Tx'd with apixaban   ESRD (end stage renal disease) on dialysis [facility])    a.) M-W-F   History of 2019 novel coronavirus disease (COVID-19) 03/06/2020   a.) s/p Tx with monoclonal Ab infusion   HLD (hyperlipidemia)  Hyperkalemia 12/23/2020   Hypertension    Left carotid artery stenosis 08/18/2021   a.) Carotid Doppler 99/24/2683: 41-96% LICA stenosis   Long term current use of anticoagulant    a.) apixaban for DVT   NICM (nonischemic cardiomyopathy) ([place])    a.) 07/2009 MV: EF 48%; b.) 08/2019 Echo: EF 45-50%. Global HK. Mod LVH. GrIDD; c.) 06/2020 Echo: EF 30-35%, glob HK, mild LVH, GrII DD, nl RV fxn, mild LAE; d.) 06/2020 Cath: nonobs dzs. e.)  LHC 06/03/2021; EF 45-50%; LVEDP 18 mmHg; nonobstructive CAD. f.) b.)  TTE 08/08/2021: EF 40-45%; global HK; mild LVH; GLS -8.6; mild LA dilation.   NSTEMI (non-ST elevated myocardial infarction) ([place]) 07/15/2020   a.) hsTnI trended: 124--> 127--> 176 --> 190 --> 200 ng/L.   Sepsis ([place])    Suicidal ideations    TIA (transient ischemic attack) 06/2021   Type 2 diabetes mellitus treated with insulin [facility])    Assessment: Pharmacy consulted to dose heparin for ACS/NSTEMI in this 61 year old male admitted with HTN urgency. Pt was on Eliquis 5 mg PO BID PTA for DVT prophylaxis, last dose on 12/4 at unspecified time.   Date Time aPTT/HL Rate/Comment 12/05 1445 68 / >1.1 Thera x1; 800 un/hr 12/06   0002    56                  Subthera; 800 units/hr  12/06 1055 66 / 1.01 Therapeutic but borderline, 900 > 1000 un/hr  12/06 1955 106  Suprathera 12/07   0345     80 /  0.82       Thera x 1, HL elevated 12/07 1431 38  Subthera; discussed with RN - infusion ran dry during HD and was re-started at 1345  Goal of Therapy:  Heparin level 0.3-0.7 units/ml aPTT :  66 - 102  Monitor platelets by anticoagulation protocol: Yes   Plan:  --aPTT is subtherapeutic due to infusion running dry --Continue heparin infusion  at 950 units/hr --Re-check aPTT 8 hours from infusion re-start --Daily CBC per protocol while on IV heparin  Benita Gutter 05/25/2022,3:39 PM

## 2022-05-25 NOTE — Progress Notes (Signed)
[facility] Progress Note  Patient: Jimmy Olson    RWE:[id number]  DOA: 05/22/2022    Date of Service: the patient was seen and examined on 05/25/2022  Brief hospital course: Patient with ESRD on dialysis admitted on the early morning 12/5 with flash pulmonary edema and hypertensive urgency with elevated troponin due to likely demand ischemia. Nephrology and cardiology were consulted on admission.  Patient just discharged on 12/1 for volume overload and hypertensive emergency.   Assessment and Plan: * Acute on chronic combined systolic and diastolic CHF (congestive heart failure) (HCC) volume is managed by Lasix and dialysis.  Blood pressures are still elevated, but improved.   Hypertensive urgency - This could be the culprit for his acute pulmonary edema.  Slowly improving - Weaned off of Cardene drip.  Continued on home antihypertensives plus volume is managed by dialysis.   Non-ST elevation (NSTEMI) myocardial infarction [facility]) - He will be placed on IV heparin. Heart cath planned for Friday  End-stage renal disease on hemodialysis [facility]) - Seen by nephrology and had received urgent dialysis x2 in 2 days  Dyslipidemia - We will continue statin therapy.  GERD without esophagitis - We will continue PPI therapy  Acute metabolic encephalopathy Following admission, patient had episodes of hypoglycemia likely brought on by overcompensation from sliding scale.  See below.  Improved with D50.  Diabetes mellitus: Decreased p.o. intake and strong response to sliding scale.  Scale and long-term acting insulin adjusted.  Overweight: Meets criteria with BMI greater than 25  Body mass index is 22.6 kg/m.        Consultants: Cardiology Diabetes coordinator Nephrology  Procedures: Planned cardiac catheterization Hemodialysis  Antimicrobials: None  Code Status: Full code   Subjective: Headache improved  Objective: Blood pressures more manageable Vitals:    05/25/22 1200 05/25/22 1237  BP: (!) 146/89 (!) 163/92  Pulse: 78 76  Resp: 10 15  Temp:  98.4 F (36.9 C)  SpO2: 98% 100%    Intake/Output Summary (Last 24 hours) at 05/25/2022 1459 Last data filed at 05/25/2022 1237 Gross per 24 hour  Intake 948.54 ml  Output 2700 ml  Net -1751.46 ml    Filed Weights   05/25/22 0107 05/25/22 0850 05/25/22 1254  Weight: 67.5 kg 65.5 kg 63.5 kg   Body mass index is 22.6 kg/m.  Exam:  General: Oriented x 2-3 HEENT: Normocephalic, atraumatic, mucous membranes slightly dry Cardiovascular: Regular rate and rhythm, S1-S2, 2 out of 6 systolic ejection murmur Respiratory: Decreased breath sounds bibasilar Abdomen: Soft, nontender, nondistended, positive bowel sounds Musculoskeletal: No clubbing or cyanosis, trace pitting edema Skin: No skin breaks, tears or lesions, fistula noted Psychiatry: Appropriate, no evidence of psychoses Neurology: No focal deficits  Data Reviewed: Phosphorus of 5.6, albumin 3.4.  Hemoglobin of 10.3.  Disposition:  Status is: Inpatient Remains inpatient appropriate because:  -Cardiac cath    Anticipated discharge date: 12/9, depending on findings of cardiac catheterization  Family Communication: Will call daughter DVT Prophylaxis: Heparin infusion    Author: Annita Brod ,MD 05/25/2022 2:59 PM  To reach On-call, see care teams to locate the attending and reach out via [email or web address]. Between 7PM-7AM, please contact night-coverage If you still have difficulty reaching the attending provider, please page the [facility] (Director on Call) for [facility] on amion for assistance.

## 2022-05-25 NOTE — Plan of Care (Signed)
  Problem: Education: Goal: Ability to demonstrate management of disease process will improve Outcome: Progressing Goal: Ability to verbalize understanding of medication therapies will improve Outcome: Progressing   Problem: Coping: Goal: Ability to adjust to condition or change in health will improve Outcome: Progressing   Problem: Fluid Volume: Goal: Ability to maintain a balanced intake and output will improve Outcome: Not Progressing   Problem: Nutritional: Goal: Maintenance of adequate nutrition will improve Outcome: Not Progressing Patient expressed that he often doesn't have an appetite while he is in the hospital.

## 2022-05-25 NOTE — Inpatient Diabetes Management (Signed)
Inpatient Diabetes Program Recommendations  AACE/ADA: New Consensus Statement on Inpatient Glycemic Control (2015)  Target Ranges:  Prepandial:   less than 140 mg/dL      Peak postprandial:   less than 180 mg/dL (1-2 hours)      Critically ill patients:  140 - 180 mg/dL   Lab Results  Component Value Date   GLUCAP 124 (H) 05/25/2022   HGBA1C 10.1 (H) 05/17/2022    Review of Glycemic Control  Latest Reference Range & Units 05/24/22 12:33 05/24/22 15:52 05/24/22 16:03 05/24/22 16:26 05/24/22 21:15 05/25/22 07:28 05/25/22 08:09  Glucose-Capillary 70 - 99 mg/dL 289 (H)  Novolog 14 units 36 (LL) 39 (LL) 186 (H) 262 (H) Novolog 2 units Levemir 18 units 67 (L) 124 (H)  (LL): Data is critically low (H): Data is abnormally high (L): Data is abnormally low  Diabetes history: DM2 Outpatient Diabetes medications: Levemir 18 units QHS, Novolog 0-15 units TID and 0-5 units QHS Current orders for Inpatient glycemic control: Levemir 18 units QHS, Novolog 0-9 units TID and 0-5 units QHS  Inpatient Diabetes Program Recommendations:    Noted meal coverage discontinued and correction decreased today due to hypoglycemia.  If patient continues to have episodes aof hypoglycemia, please consider:  Levemir 9 units QHS (50% of home dose).    Will continue to follow while inpatient.  Thank you, Reche Dixon, MSN, [place] Diabetes Coordinator Inpatient Diabetes Program [phone] (team pager from 8a-5p)

## 2022-05-26 ENCOUNTER — Encounter
Admission: EM | Disposition: A | Discharge status: Home / Self Care | Payer: Self-pay | Source: Home / Self Care | Attending: Internal Medicine

## 2022-05-26 DIAGNOSIS — I160 Hypertensive urgency: Secondary | ICD-10-CM | POA: Diagnosis not present

## 2022-05-26 DIAGNOSIS — N186 End stage renal disease: Secondary | ICD-10-CM | POA: Diagnosis not present

## 2022-05-26 DIAGNOSIS — I5043 Acute on chronic combined systolic (congestive) and diastolic (congestive) heart failure: Secondary | ICD-10-CM | POA: Diagnosis not present

## 2022-05-26 DIAGNOSIS — I2510 Atherosclerotic heart disease of native coronary artery without angina pectoris: Secondary | ICD-10-CM

## 2022-05-26 DIAGNOSIS — I214 Non-ST elevation (NSTEMI) myocardial infarction: Secondary | ICD-10-CM | POA: Diagnosis not present

## 2022-05-26 HISTORY — PX: RIGHT/LEFT HEART CATH AND CORONARY ANGIOGRAPHY: CATH118266

## 2022-05-26 LAB — POCT I-STAT 7, (LYTES, BLD GAS, ICA,H+H)
Acid-Base Excess: 2 mmol/L (ref 0.0–2.0)
Bicarbonate: 27.3 mmol/L (ref 20.0–28.0)
Calcium, Ion: 1.19 mmol/L (ref 1.15–1.40)
HCT: 29 % — ABNORMAL LOW (ref 39.0–52.0)
Hemoglobin: 9.9 g/dL — ABNORMAL LOW (ref 13.0–17.0)
O2 Saturation: 94 %
Potassium: 3.6 mmol/L (ref 3.5–5.1)
Sodium: 135 mmol/L (ref 135–145)
TCO2: 29 mmol/L (ref 22–32)
pCO2 arterial: 42.8 mmHg (ref 32–48)
pH, Arterial: 7.412 (ref 7.35–7.45)
pO2, Arterial: 72 mmHg — ABNORMAL LOW (ref 83–108)

## 2022-05-26 LAB — GLUCOSE, CAPILLARY
Glucose-Capillary: 88 mg/dL (ref 70–99)
Glucose-Capillary: 149 mg/dL — ABNORMAL HIGH (ref 70–99)
Glucose-Capillary: 62 mg/dL — ABNORMAL LOW (ref 70–99)
Glucose-Capillary: 114 mg/dL — ABNORMAL HIGH (ref 70–99)
Glucose-Capillary: 153 mg/dL — ABNORMAL HIGH (ref 70–99)
Glucose-Capillary: 318 mg/dL — ABNORMAL HIGH (ref 70–99)
Glucose-Capillary: 120 mg/dL — ABNORMAL HIGH (ref 70–99)

## 2022-05-26 LAB — CBC
HCT: 32.7 % — ABNORMAL LOW (ref 39.0–52.0)
Hemoglobin: 11.2 g/dL — ABNORMAL LOW (ref 13.0–17.0)
MCH: 30.6 pg (ref 26.0–34.0)
MCHC: 34.3 g/dL (ref 30.0–36.0)
MCV: 89.3 fL (ref 80.0–100.0)
Platelets: 217 10*3/uL (ref 150–400)
RBC: 3.66 MIL/uL — ABNORMAL LOW (ref 4.22–5.81)
RDW: 12.4 % (ref 11.5–15.5)
WBC: 4.7 10*3/uL (ref 4.0–10.5)
nRBC: 0 % (ref 0.0–0.2)

## 2022-05-26 LAB — POCT I-STAT EG7
Acid-Base Excess: 3 mmol/L — ABNORMAL HIGH (ref 0.0–2.0)
Bicarbonate: 28.5 mmol/L — ABNORMAL HIGH (ref 20.0–28.0)
Calcium, Ion: 1.19 mmol/L (ref 1.15–1.40)
HCT: 28 % — ABNORMAL LOW (ref 39.0–52.0)
Hemoglobin: 9.5 g/dL — ABNORMAL LOW (ref 13.0–17.0)
O2 Saturation: 53 %
Potassium: 3.6 mmol/L (ref 3.5–5.1)
Sodium: 134 mmol/L — ABNORMAL LOW (ref 135–145)
TCO2: 30 mmol/L (ref 22–32)
pCO2, Ven: 46.2 mmHg (ref 44–60)
pH, Ven: 7.399 (ref 7.25–7.43)
pO2, Ven: 29 mmHg — CL (ref 32–45)

## 2022-05-26 LAB — BASIC METABOLIC PANEL
Anion gap: 9 (ref 5–15)
BUN: 27 mg/dL — ABNORMAL HIGH (ref 6–20)
CO2: 28 mmol/L (ref 22–32)
Calcium: 9.1 mg/dL (ref 8.9–10.3)
Chloride: 101 mmol/L (ref 98–111)
Creatinine, Ser: 4.65 mg/dL — ABNORMAL HIGH (ref 0.61–1.24)
GFR, Estimated: 14 mL/min — ABNORMAL LOW (ref >60)
Glucose, Bld: 78 mg/dL (ref 70–99)
Potassium: 3.8 mmol/L (ref 3.5–5.1)
Sodium: 138 mmol/L (ref 135–145)

## 2022-05-26 LAB — HEPARIN LEVEL (UNFRACTIONATED): Heparin Unfractionated: 0.78 IU/mL — ABNORMAL HIGH (ref 0.30–0.70)

## 2022-05-26 LAB — CARDIAC CATHETERIZATION: Cath EF Quantitative: 35 %

## 2022-05-26 LAB — APTT: aPTT: 96 seconds — ABNORMAL HIGH (ref 24–36)

## 2022-05-26 SURGERY — RIGHT/LEFT HEART CATH AND CORONARY ANGIOGRAPHY
Anesthesia: Moderate Sedation

## 2022-05-26 MED ORDER — VERAPAMIL HCL 2.5 MG/ML IV SOLN
INTRAVENOUS | Status: DC | PRN
  Administered 2022-05-26: 2.5 mg via INTRA_ARTERIAL

## 2022-05-26 MED ORDER — SODIUM CHLORIDE 0.9 % IV SOLN
INTRAVENOUS | Status: DC | PRN
  Administered 2022-05-26: 250 mL via INTRAVENOUS

## 2022-05-26 MED ORDER — SODIUM CHLORIDE 0.9% FLUSH: 3.0000 mL | INTRAVENOUS | Status: DC | PRN

## 2022-05-26 MED ORDER — MIDAZOLAM HCL 2 MG/2ML IJ SOLN
INTRAMUSCULAR | Status: DC | PRN
  Administered 2022-05-26: 1 mg via INTRAVENOUS

## 2022-05-26 MED ORDER — INSULIN ASPART 100 UNIT/ML IJ SOLN
2.0000 [IU] | Freq: Three times a day (TID) | INTRAMUSCULAR | Status: DC
  Administered 2022-05-26 – 2022-05-28 (×5): 2 [IU] via SUBCUTANEOUS
  Filled 2022-05-26: qty 1

## 2022-05-26 MED ORDER — NOREPINEPHRINE BITARTRATE 1 MG/ML IV SOLN
INTRAVENOUS | Status: DC | PRN
  Administered 2022-05-26: 10 ug/min via INTRAVENOUS

## 2022-05-26 MED ORDER — MIDAZOLAM HCL 2 MG/2ML IJ SOLN
INTRAMUSCULAR | Status: AC
  Filled 2022-05-26: qty 2

## 2022-05-26 MED ORDER — INSULIN DETEMIR 100 UNIT/ML ~~LOC~~ SOLN
12.0000 [IU] | Freq: Every day | SUBCUTANEOUS | Status: DC
  Administered 2022-05-26 – 2022-05-27 (×2): 12 [IU] via SUBCUTANEOUS
  Filled 2022-05-26 (×3): qty 0.12

## 2022-05-26 MED ORDER — SODIUM CHLORIDE 0.9% FLUSH
3.0000 mL | Freq: Two times a day (BID) | INTRAVENOUS | Status: DC
  Administered 2022-05-26 (×2): 3 mL via INTRAVENOUS

## 2022-05-26 MED ORDER — LIVING WELL WITH DIABETES BOOK - IN SPANISH
Freq: Once | Status: AC
  Filled 2022-05-26: qty 1

## 2022-05-26 MED ORDER — HEPARIN (PORCINE) IN NACL 1000-0.9 UT/500ML-% IV SOLN
INTRAVENOUS | Status: DC | PRN
  Administered 2022-05-26 (×2): 500 mL

## 2022-05-26 MED ORDER — HEPARIN SODIUM (PORCINE) 1000 UNIT/ML IJ SOLN
INTRAMUSCULAR | Status: DC | PRN
  Administered 2022-05-26: 4000 [IU] via INTRAVENOUS

## 2022-05-26 MED ORDER — HEPARIN SODIUM (PORCINE) 1000 UNIT/ML IJ SOLN
INTRAMUSCULAR | Status: AC
  Filled 2022-05-26: qty 10

## 2022-05-26 MED ORDER — IOHEXOL 300 MG/ML  SOLN
INTRAMUSCULAR | Status: DC | PRN
  Administered 2022-05-26: 78 mL

## 2022-05-26 MED ORDER — HEPARIN (PORCINE) IN NACL 1000-0.9 UT/500ML-% IV SOLN
INTRAVENOUS | Status: AC
  Filled 2022-05-26: qty 1000

## 2022-05-26 MED ORDER — VERAPAMIL HCL 2.5 MG/ML IV SOLN
INTRAVENOUS | Status: AC
  Filled 2022-05-26: qty 2

## 2022-05-26 MED ORDER — FENTANYL CITRATE (PF) 100 MCG/2ML IJ SOLN
INTRAMUSCULAR | Status: AC
  Filled 2022-05-26: qty 2

## 2022-05-26 MED ORDER — NOREPINEPHRINE BITARTRATE 1 MG/ML IV SOLN
INTRAVENOUS | Status: AC
  Filled 2022-05-26: qty 4

## 2022-05-26 MED ORDER — DEXTROSE 50 % IV SOLN
INTRAVENOUS | Status: AC
  Filled 2022-05-26: qty 50

## 2022-05-26 MED ORDER — FENTANYL CITRATE (PF) 100 MCG/2ML IJ SOLN
INTRAMUSCULAR | Status: DC | PRN
  Administered 2022-05-26: 25 ug via INTRAVENOUS

## 2022-05-26 MED ORDER — DEXTROSE 50 % IV SOLN
12.5000 g | INTRAVENOUS | Status: AC
  Administered 2022-05-26: 12.5 g via INTRAVENOUS

## 2022-05-26 MED ORDER — LIDOCAINE HCL 1 % IJ SOLN
INTRAMUSCULAR | Status: AC
  Filled 2022-05-26: qty 20

## 2022-05-26 MED ORDER — SODIUM CHLORIDE 0.9 % IV SOLN: 250.0000 mL | INTRAVENOUS | Status: DC | PRN

## 2022-05-26 SURGICAL SUPPLY — 13 items
BAND ZEPHYR COMPRESS 30 LONG (HEMOSTASIS) IMPLANT
CATH 5FR JL3.5 JR4 ANG PIG MP (CATHETERS) IMPLANT
CATH SWAN GANZ 7F STRAIGHT (CATHETERS) IMPLANT
DRAPE BRACHIAL (DRAPES) IMPLANT
GLIDESHEATH SLEND SS 6F .021 (SHEATH) IMPLANT
GLIDESHEATH SLENDER 7FR .021G (SHEATH) IMPLANT
GUIDEWIRE .025 260CM (WIRE) IMPLANT
GUIDEWIRE INQWIRE 1.5J.035X260 (WIRE) IMPLANT
INQWIRE 1.5J .035X260CM (WIRE) ×1
PACK CARDIAC CATH (CUSTOM PROCEDURE TRAY) ×1 IMPLANT
PROTECTION STATION PRESSURIZED (MISCELLANEOUS) ×1
SET ATX SIMPLICITY (MISCELLANEOUS) IMPLANT
STATION PROTECTION PRESSURIZED (MISCELLANEOUS) IMPLANT

## 2022-05-26 NOTE — Interval H&P Note (Signed)
History and Physical Interval Note:  05/26/2022 9:18 AM  Jimmy Olson  has presented today for surgery, with the diagnosis of NSTEMI with acute on chronic heart failure.  The various methods of treatment have been discussed with the patient and family. After consideration of risks, benefits and other options for treatment, the patient has consented to  Procedure(s): RIGHT/LEFT HEART CATH AND CORONARY ANGIOGRAPHY (N/A) as a surgical intervention.  The patient's history has been reviewed, patient examined, no change in status, stable for surgery.  I have reviewed the patient's chart and labs.  Questions were answered to the patient's satisfaction.     [name]

## 2022-05-26 NOTE — Progress Notes (Signed)
ANTICOAGULATION CONSULT NOTE  Pharmacy Consult for IV Heparin  Indication: chest pain/ACS  Patient Measurements: Height: 5\' 6"  (167.6 cm) Weight: 66.2 kg (145 lb 15.1 oz) IBW/kg (Calculated) : 63.8 Heparin Dosing Weight: 67 kg   Labs: Recent Labs    05/24/22 0544 05/24/22 1055 05/24/22 1955 05/25/22 0345 05/25/22 0717 05/25/22 1431 05/25/22 2233 05/26/22 0747  HGB 9.9*  --   --  10.3*  --   --   --  11.2*  HCT 29.1*  --   --  29.1*  --   --   --  32.7*  PLT 183  --   --  202  --   --   --  217  APTT  --  66*   < > 80*  --  38* 63* 96*  LABPROT  --   --   --   --   --   --  12.8  --   INR  --   --   --   --   --   --  1.0  --   HEPARINUNFRC  --  1.01*  --  0.82*  --   --   --  0.78*  CREATININE 4.00*  --   --   --  5.48*  --   --  4.65*   < > = values in this interval not displayed.     Estimated Creatinine Clearance: 15.4 mL/min (A) (by C-G formula based on SCr of 4.65 mg/dL (H)).   Medical History: Past Medical History:  Diagnosis Date   Anemia    Anxiety and depression    ATN (acute tubular necrosis) (HCC)    CAD (coronary artery disease)    a.) R/LHC 07/19/2020: 20% pRCA, 30% mRCA, 50% dRCA, 50% RPAV, 40% pLAD, 70% D1, 60% mLAD; LVEDP 19 mmHg; PA 37/16 (26 mmHg); PCWP 13 mmHg. b.) LHC 06/03/2021: EF 45-50%; LVEDP 18 mmHg; 40% pLAD, 60% mLAD, 20% pRCA, 30% mRCA, 70% D1, 30% dRCA, 40% RPDA.   Chronic HFrEF (heart failure with reduced ejection fraction) ([place])    a.) 06/2020 Echo: EF 30-35%, glob HK, GrII DD, nl RV fxn. Mild LAE. b.)  TTE 08/08/2021: EF 40-45%; global HK; mild LVH; GLS -8.6; mild LA dilation.   DVT of axillary vein, acute left ([place]) 05/2021   a.) Tx'd with apixaban   ESRD (end stage renal disease) on dialysis [facility])    a.) M-W-F   History of 2019 novel coronavirus disease (COVID-19) 03/06/2020   a.) s/p Tx with monoclonal Ab infusion   HLD (hyperlipidemia)    Hyperkalemia 12/23/2020   Hypertension    Left carotid artery stenosis 08/18/2021   a.)  Carotid Doppler 66/44/0347: 42-59% LICA stenosis   Long term current use of anticoagulant    a.) apixaban for DVT   NICM (nonischemic cardiomyopathy) ([place])    a.) 07/2009 MV: EF 48%; b.) 08/2019 Echo: EF 45-50%. Global HK. Mod LVH. GrIDD; c.) 06/2020 Echo: EF 30-35%, glob HK, mild LVH, GrII DD, nl RV fxn, mild LAE; d.) 06/2020 Cath: nonobs dzs. e.)  LHC 06/03/2021; EF 45-50%; LVEDP 18 mmHg; nonobstructive CAD. f.) b.)  TTE 08/08/2021: EF 40-45%; global HK; mild LVH; GLS -8.6; mild LA dilation.   NSTEMI (non-ST elevated myocardial infarction) ([place]) 07/15/2020   a.) hsTnI trended: 124--> 127--> 176 --> 190 --> 200 ng/L.   Sepsis ([place])    Suicidal ideations    TIA (transient ischemic attack) 06/2021   Type 2 diabetes mellitus treated with  insulin [facility])    Assessment: Pharmacy consulted to dose heparin for ACS/NSTEMI in this 61 year old male admitted with HTN urgency. Pt was on Eliquis 5 mg PO BID PTA for DVT prophylaxis, last dose on 12/4 at unspecified time.   Date Time aPTT  Rate/Comment 12/05 1445 68   Thera x1; 800 un/hr 12/06   0002    56                  Subthera; 800 units/hr  12/06 1055 66   Therapeutic but borderline, 900 > 1000 un/hr 12/06 1955 106  Suprathera 12/07   0345    80  Thera x 1, HL elevated @ 950un/hr 12/07 1431 38  Subthera; discussed with RN - infusion ran dry during HD and was re-started at 1345 (rate 950un/hr) 12/07   2233  63   SUBtherapeutic 950 ->1050 un/hr 12/08 0747 96  Therapeutic x 1 at 1050 un/hr   Goal of Therapy:  Heparin level 0.3-0.7 units/ml aPTT :  66 - 102  Monitor platelets by anticoagulation protocol: Yes   Plan:  aPTT therapeutic x1 at 1050 un/hr, patient scheuled for cath lab this AM Will repeat aPTT 8hrs after Cath lab if heparin gtt to continue.  Arienna Benegas Rodriguez-Donaghue PharmD, BCPS 05/26/2022 9:19 AM

## 2022-05-26 NOTE — Progress Notes (Signed)
Interperter [id number] Christian for assessment

## 2022-05-26 NOTE — Plan of Care (Signed)
  Problem: Education: Goal: Ability to demonstrate management of disease process will improve Outcome: Progressing Goal: Ability to verbalize understanding of medication therapies will improve Outcome: Progressing Goal: Individualized Educational Video(s) Outcome: Progressing   Problem: Activity: Goal: Capacity to carry out activities will improve Outcome: Progressing   Problem: Cardiac: Goal: Ability to achieve and maintain adequate cardiopulmonary perfusion will improve Outcome: Progressing   Problem: Education: Goal: Ability to describe self-care measures that may prevent or decrease complications (Diabetes Survival Skills Education) will improve Outcome: Progressing Goal: Individualized Educational Video(s) Outcome: Progressing   Problem: Coping: Goal: Ability to adjust to condition or change in health will improve Outcome: Progressing   Problem: Fluid Volume: Goal: Ability to maintain a balanced intake and output will improve Outcome: Progressing   Problem: Health Behavior/Discharge Planning: Goal: Ability to identify and utilize available resources and services will improve Outcome: Progressing Goal: Ability to manage health-related needs will improve Outcome: Progressing   Problem: Metabolic: Goal: Ability to maintain appropriate glucose levels will improve Outcome: Progressing   Problem: Nutritional: Goal: Maintenance of adequate nutrition will improve Outcome: Progressing Goal: Progress toward achieving an optimal weight will improve Outcome: Progressing   Problem: Skin Integrity: Goal: Risk for impaired skin integrity will decrease Outcome: Progressing   Problem: Tissue Perfusion: Goal: Adequacy of tissue perfusion will improve Outcome: Progressing   Problem: Education: Goal: Knowledge of General Education information will improve Description: Including pain rating scale, medication(s)/side effects and non-pharmacologic comfort measures Outcome:  Progressing   Problem: Health Behavior/Discharge Planning: Goal: Ability to manage health-related needs will improve Outcome: Progressing   Problem: Clinical Measurements: Goal: Ability to maintain clinical measurements within normal limits will improve Outcome: Progressing Goal: Will remain free from infection Outcome: Progressing Goal: Diagnostic test results will improve Outcome: Progressing Goal: Respiratory complications will improve Outcome: Progressing Goal: Cardiovascular complication will be avoided Outcome: Progressing   Problem: Activity: Goal: Risk for activity intolerance will decrease Outcome: Progressing   Problem: Nutrition: Goal: Adequate nutrition will be maintained Outcome: Progressing   Problem: Coping: Goal: Level of anxiety will decrease Outcome: Progressing   Problem: Elimination: Goal: Will not experience complications related to bowel motility Outcome: Progressing Goal: Will not experience complications related to urinary retention Outcome: Progressing   Problem: Pain Managment: Goal: General experience of comfort will improve Outcome: Progressing   Problem: Safety: Goal: Ability to remain free from injury will improve Outcome: Progressing   Problem: Skin Integrity: Goal: Risk for impaired skin integrity will decrease Outcome: Progressing   Problem: Education: Goal: Understanding of CV disease, CV risk reduction, and recovery process will improve Outcome: Progressing Goal: Individualized Educational Video(s) Outcome: Progressing   Problem: Activity: Goal: Ability to return to baseline activity level will improve Outcome: Progressing   Problem: Cardiovascular: Goal: Ability to achieve and maintain adequate cardiovascular perfusion will improve Outcome: Progressing Goal: Vascular access site(s) Level 0-1 will be maintained Outcome: Progressing   Problem: Health Behavior/Discharge Planning: Goal: Ability to safely manage  health-related needs after discharge will improve Outcome: Progressing   

## 2022-05-26 NOTE — Progress Notes (Signed)
[facility]  ROUNDING NOTE   Subjective:   Jimmy Olson  is a 61 year old Hispanic male with past medical conditions including anemia, two-vessel CAD, diabetes, depression, DVT with Eliquis, and end-stage renal disease on hemodialysis.  Patient presents to the emergency department with complaints of shortness of breath.  He has been admitted for Shortness of breath [R06.02] Flash pulmonary edema ([place]) [J81.0] Hypertensive urgency [I16.0] Hypertensive emergency [I16.1] Chest pain, unspecified type [R07.9]  Patient is known to our practice and receives outpatient dialysis treatments at [facility] on a TTS schedule, supervised by Dr. Candiss Norse.   Patient seen resting in bed after cardiac cath procedure Alert and oriented No family at bedside at present Currently awaiting meal tray Denies chest pain but reports minor discomfort   Objective:  Vital signs in last 24 hours:  Temp:  [97.5 F (36.4 C)-98.6 F (37 C)] 98.5 F (36.9 C) (12/08 1221) Pulse Rate:  [65-87] 70 (12/08 1221) Resp:  [9-19] 18 (12/08 1221) BP: (94-179)/(59-94) 117/64 (12/08 1221) SpO2:  [96 %-100 %] 100 % (12/08 1221) Weight:  [66.2 kg] 66.2 kg (12/08 0339)  Weight change: -2 kg Filed Weights   05/25/22 0850 05/25/22 1254 05/26/22 0339  Weight: 65.5 kg 63.5 kg 66.2 kg    Intake/Output: I/O last 3 completed shifts: In: 1419.2 [P.O.:960; I.V.:459.2] Out: 3150 [Urine:1150; Other:2000]   Intake/Output this shift:  No intake/output data recorded.  Physical Exam: General: NAD  Head: Normocephalic, atraumatic. Moist oral mucosal membranes  Eyes: Anicteric  Lungs:  Clear to auscultation, normal effort, room air  Heart: Regular rate and rhythm  Abdomen:  Soft, nontender  Extremities: Trace peripheral edema.  Neurologic: Nonfocal, moving all four extremities  Skin: No lesions  Access: Right chest PermCath    Basic Metabolic Panel: Recent Labs  Lab 05/22/22 2338 05/23/22 0601  05/24/22 0544 05/25/22 0717 05/26/22 0747  NA 139 138 136 136 138  K 4.5 3.7 3.5 3.5 3.8  CL 105 106 102 101 101  CO2 25 23 27 27 28   GLUCOSE 294* 395* 136* 77 78  BUN 51* 58* 28* 38* 27*  CREATININE 5.61* 5.68* 4.00* 5.48* 4.65*  CALCIUM 8.8* 8.3* 8.1* 8.9 9.1  MG 2.1  --   --   --   --   PHOS  --   --   --  5.6*  --      Liver Function Tests: Recent Labs  Lab 05/25/22 0717  ALBUMIN 3.4*    No results for input(s): "LIPASE", "AMYLASE" in the last 168 hours. No results for input(s): "AMMONIA" in the last 168 hours.  CBC: Recent Labs  Lab 05/22/22 2338 05/23/22 0601 05/24/22 0544 05/25/22 0345 05/26/22 0747  WBC 5.3 5.6 4.8 5.2 4.7  HGB 9.8* 9.1* 9.9* 10.3* 11.2*  HCT 28.4* 27.1* 29.1* 29.1* 32.7*  MCV 91.6 91.2 90.1 89.0 89.3  PLT 198 174 183 202 217     Cardiac Enzymes: No results for input(s): "CKTOTAL", "CKMB", "CKMBINDEX", "TROPONINI" in the last 168 hours.  BNP: Invalid input(s): "POCBNP"  CBG: Recent Labs  Lab 05/26/22 0703 05/26/22 0850 05/26/22 0907 05/26/22 1017 05/26/22 1221  GLUCAP 88 62* 149* 114* 153*     Microbiology: Results for orders placed or performed during the hospital encounter of 05/22/22  Resp Panel by RT-PCR (Flu A&B, Covid) Anterior Nasal Swab     Status: None   Collection Time: 05/22/22 11:58 PM   Specimen: Anterior Nasal Swab  Result Value Ref Range Status  SARS Coronavirus 2 by RT PCR NEGATIVE NEGATIVE Final    Comment: (NOTE) SARS-CoV-2 target nucleic acids are NOT DETECTED.  The SARS-CoV-2 RNA is generally detectable in upper respiratory specimens during the acute phase of infection. The lowest concentration of SARS-CoV-2 viral copies this assay can detect is 138 copies/mL. A negative result does not preclude SARS-Cov-2 infection and should not be used as the sole basis for treatment or other patient management decisions. A negative result may occur with  improper specimen collection/handling, submission of  specimen other than nasopharyngeal swab, presence of viral mutation(s) within the areas targeted by this assay, and inadequate number of viral copies(<138 copies/mL). A negative result must be combined with clinical observations, patient history, and epidemiological information. The expected result is Negative.  Fact Sheet for Patients:  EntrepreneurPulse.com.au  Fact Sheet for Healthcare Providers:  IncredibleEmployment.be  This test is no t yet approved or cleared by the [place] FDA and  has been authorized for detection and/or diagnosis of SARS-CoV-2 by FDA under an Emergency Use Authorization (EUA). This EUA will remain  in effect (meaning this test can be used) for the duration of the COVID-19 declaration under Section 564(b)(1) of the Act, 21 U.S.C.section 360bbb-3(b)(1), unless the authorization is terminated  or revoked sooner.       Influenza A by PCR NEGATIVE NEGATIVE Final   Influenza B by PCR NEGATIVE NEGATIVE Final    Comment: (NOTE) The Xpert Xpress SARS-CoV-2/FLU/RSV plus assay is intended as an aid in the diagnosis of influenza from Nasopharyngeal swab specimens and should not be used as a sole basis for treatment. Nasal washings and aspirates are unacceptable for Xpert Xpress SARS-CoV-2/FLU/RSV testing.  Fact Sheet for Patients: EntrepreneurPulse.com.au  Fact Sheet for Healthcare Providers: IncredibleEmployment.be  This test is not yet approved or cleared by the [place] FDA and has been authorized for detection and/or diagnosis of SARS-CoV-2 by FDA under an Emergency Use Authorization (EUA). This EUA will remain in effect (meaning this test can be used) for the duration of the COVID-19 declaration under Section 564(b)(1) of the Act, 21 U.S.C. section 360bbb-3(b)(1), unless the authorization is terminated or revoked.  Performed at [facility], [address] [place], [place] [address]     Coagulation Studies: Recent Labs    05/25/22 2231/09/29  LABPROT 12.8  INR 1.0     Urinalysis: No results for input(s): "COLORURINE", "LABSPEC", "PHURINE", "GLUCOSEU", "HGBUR", "BILIRUBINUR", "KETONESUR", "PROTEINUR", "UROBILINOGEN", "NITRITE", "LEUKOCYTESUR" in the last 72 hours.  Invalid input(s): "APPERANCEUR"    Imaging: CARDIAC CATHETERIZATION  Result Date: 05/26/2022   Prox LAD lesion is 40% stenosed.   Prox RCA lesion is 20% stenosed.   Mid RCA lesion is 30% stenosed.   Dist RCA lesion is 30% stenosed.   1st Diag lesion is 70% stenosed.   RPDA lesion is 40% stenosed.   Mid LAD lesion is 80% stenosed.   Ost LAD to Prox LAD lesion is 40% stenosed.   There is moderate left ventricular systolic dysfunction.   LV end diastolic pressure is normal. 1.  Severely calcified coronary arteries with significant one-vessel coronary artery disease.  Progression of mid LAD stenosis to 80%. 2.  Moderately reduced LV systolic function with an EF of 35%. 3.  Right heart catheterization showed low filling pressures, normal pulmonary pressure and mildly reduced cardiac output.  RA: 3 mmHg, RV: 19/2 mmHg, PW: 5 mmHg, PA: 16/6 with a mean of 10 mmHg, cardiac output is 4.7 with an index of 2.5. 4.  Transient hypotension during the procedure likely due to sedation and volume depletion.  The patient was given 250 mL of normal saline bolus and Levophed was used during the case and weaned off at the end. Recommendations: The patient is volume depleted.  I discontinued IV furosemide. Continue aggressive medical therapy. Recommend outpatient LAD atherectomy and stent placement.  This can be done by me at [facility] in the next 2 weeks.    Medications:    sodium chloride      carvedilol  25 mg Oral BID WC   Chlorhexidine Gluconate Cloth  6 each Topical Q0600   dextrose       escitalopram  10 mg Oral QHS   hydrALAZINE  100 mg Oral BID   insulin aspart  0-5 Units Subcutaneous QHS    insulin aspart  0-9 Units Subcutaneous TID WC   insulin detemir  18 Units Subcutaneous QHS   isosorbide mononitrate  30 mg Oral BID   living well with diabetes book- in spanish   Does not apply Once   losartan  100 mg Oral Daily   pantoprazole  20 mg Oral Daily   rosuvastatin  10 mg Oral Daily   sodium chloride flush  3 mL Intravenous Q12H   sodium chloride flush  3 mL Intravenous Q12H   sodium chloride, acetaminophen **OR** acetaminophen, albuterol, dextrose, magnesium hydroxide, nitroGLYCERIN, ondansetron **OR** ondansetron (ZOFRAN) IV, sodium chloride flush, traZODone  Assessment/ Plan:  Mr. Jimmy Olson is a 61 y.o.  male with past medical conditions including anemia, two-vessel CAD, diabetes, depression, DVT with Eliquis, and end-stage renal disease on hemodialysis.  Patient presents to the emergency department with complaints of shortness of breath.  He has been admitted for Shortness of breath [R06.02] Flash pulmonary edema ([place]) [J81.0] Hypertensive urgency [I16.0] Hypertensive emergency [I16.1] Chest pain, unspecified type [R07.9]  [facility] /TTS/right chest PermCath/69.0 kg  End-stage renal disease on hemodialysis.  Dialysis received yesterday, UF 2 L achieved.  Next treatment scheduled for Saturday.  2. Anemia of chronic kidney disease  Lab Results  Component Value Date   HGB 11.2 (L) 05/26/2022    Patient receives [place] outpatient.  Hemoglobin within desired target.  3. Secondary Hyperparathyroidism: with outpatient labs: PTH 331, phosphorus 4.8, calcium 8.8 on 05/02/22.   Lab Results  Component Value Date   CALCIUM 9.1 05/26/2022   CAION 1.23 09/29/2021   PHOS 5.6 (H) 05/25/2022  Will continue to monitor bone minerals during this admission.  Phosphorus slightly elevated.  4. Diabetes mellitus type II with chronic kidney disease/renal manifestations: insulin dependent. Home regimen includes NovoLog, Humulog, Levemir. Most recent hemoglobin A1c  is 10.1 on 07/14/21.  Glucose well-controlled today, primary team to manage sliding scale insulin.  5. NSTEMI with CAD. Cardiology completed cardiac catheterization today.  Results as follow: Severely calcified coronary arteries with significant one-vessel coronary artery disease, moderately reduced LV systolic function, right heart cath showed low filling pressures.  Transient hypotension, IV furosemide DC'd.  Cardiology recommending outpatient LAD arthrectomy and stent placement.   LOS: [place] 12/8/20232:55 PM

## 2022-05-26 NOTE — Progress Notes (Signed)
[facility] Progress Note  Patient: Jimmy Olson    OFB:[id number]  DOA: 05/22/2022    Date of Service: the patient was seen and examined on 05/26/2022  Brief hospital course: Patient with ESRD on dialysis admitted on the early morning 12/5 with flash pulmonary edema and hypertensive urgency with elevated troponin secondary to non-STEMI.  Nephrology and cardiology were consulted on admission.  Patient just discharged on 12/1 for volume overload and hypertensive emergency.   Assessment and Plan: * Acute on chronic combined systolic and diastolic CHF (congestive heart failure) (HCC) volume is managed by Lasix and dialysis.  Blood pressures are still elevated, but improved.   Hypertensive urgency - This could be the culprit for his acute pulmonary edema.  Slowly improving - Weaned off of Cardene drip.  Continued on home antihypertensives plus volume is managed by dialysis.   Non-ST elevation (NSTEMI) myocardial infarction [facility]) Status post cardiac cath 12/8 noting LAD stenosis of 80% but calcified.  Plan is for outpatient PCI with atherectomy at [facility], as early as next week.  End-stage renal disease on hemodialysis [facility]) - Seen by nephrology and had received urgent dialysis x2 in 2 days  Dyslipidemia - We will continue statin therapy.  GERD without esophagitis - We will continue PPI therapy  Acute metabolic encephalopathy Following admission, patient had episodes of hypoglycemia likely brought on by overcompensation from sliding scale.  See below.  Improved with D50.  Diabetes mellitus: Appreciate diabetes coordinator help.  Levemir adjusted.  Overweight: Meets criteria with BMI greater than 25  Body mass index is 23.56 kg/m.        Consultants: Cardiology Diabetes coordinator Nephrology  Procedures: Cardiac catheterization Hemodialysis  Antimicrobials: None  Code Status: Full code   Subjective: Fatigued  Objective: Blood pressures more  manageable Vitals:   05/26/22 1221 05/26/22 1518  BP: 117/64 108/63  Pulse: 70 72  Resp: 18 17  Temp: 98.5 F (36.9 C) 98.3 F (36.8 C)  SpO2: 100% 99%    Intake/Output Summary (Last 24 hours) at 05/26/2022 1530 Last data filed at 05/26/2022 1519 Gross per 24 hour  Intake 810.68 ml  Output 950 ml  Net -139.32 ml    Filed Weights   05/25/22 0850 05/25/22 1254 05/26/22 0339  Weight: 65.5 kg 63.5 kg 66.2 kg   Body mass index is 23.56 kg/m.  Exam:  General: Oriented x 2-3 HEENT: Normocephalic, atraumatic, mucous membranes slightly dry Cardiovascular: Regular rate and rhythm, S1-S2, 2 out of 6 systolic ejection murmur Respiratory: Decreased breath sounds bibasilar Abdomen: Soft, nontender, nondistended, positive bowel sounds Musculoskeletal: No clubbing or cyanosis, trace pitting edema Skin: No skin breaks, tears or lesions, fistula noted Psychiatry: Appropriate, no evidence of psychoses Neurology: No focal deficits  Data Reviewed: Phosphorus of 5.6, albumin 3.4.  Hemoglobin of 10.3.  Disposition:  Status is: Inpatient Remains inpatient appropriate because:  Monitoring patient post cath.  He had some hypotension during procedure.    Anticipated discharge date: 12/9  Family Communication: Will call daughter DVT Prophylaxis: Heparin infusion    Author: Annita Brod ,MD 05/26/2022 3:30 PM  To reach On-call, see care teams to locate the attending and reach out via [email or web address]. Between 7PM-7AM, please contact night-coverage If you still have difficulty reaching the attending provider, please page the [facility] (Director on Call) for [facility] on amion for assistance.

## 2022-05-26 NOTE — Inpatient Diabetes Management (Addendum)
Inpatient Diabetes Program Recommendations  AACE/ADA: New Consensus Statement on Inpatient Glycemic Control   Target Ranges:  Prepandial:   less than 140 mg/dL      Peak postprandial:   less than 180 mg/dL (1-2 hours)      Critically ill patients:  140 - 180 mg/dL    Latest Reference Range & Units 05/26/22 07:03 05/26/22 08:50  Glucose-Capillary 70 - 99 mg/dL 88 62 (L)    Latest Reference Range & Units 05/25/22 07:28 05/25/22 08:09 05/25/22 16:36 05/25/22 21:55  Glucose-Capillary 70 - 99 mg/dL 67 (L) 124 (H) 346 (H) 226 (H)    Latest Reference Range & Units 05/17/22 20:23  Hemoglobin A1C 4.8 - 5.6 % 10.1 (H)   Review of Glycemic Control  Diabetes history: DM2 Outpatient Diabetes medications: Levemir 30 units QHS, Novolog 3-5  units TID with meals Current orders for Inpatient glycemic control: Levemir 18 units QHS, Novolog 0-9 units with meals, Novolog 0-5 units QHS  Inpatient Diabetes Program Recommendations:    Insulin: Please consider decreasing Levemir to 12 units QHS and ordering Novolog 2 units TID with meals for meal coverage if patient eats at least 50% of meals.  Outpatient DM medications: Patient reports frequent hypoglycemia in the morning upon awakening. May want to consider adjusting outpatient insulin regimen at discharge.  Addendum 05/26/22@13 :47-Spoke with patient at bedside with interpreter on [employer] device Lyn Records, [phone]). Spoke with patient about diabetes and home regimen for diabetes control. Patient reports 2 kinds of insulin; asked specifically if he was taking Levemir and Novolog patient states that is correct. Patient reports taking Levemir 30 units QHS and Novolog 3-5 units TID with meals as an outpatient for diabetes control. Patient reports taking DM medications as prescribed and does not miss or skip insulin.   Patient reports glucose is typically 150-200's mg/dl. He also notes that he has hypoglycemia in the morning upon awakening at least 2 times a week.   Patient states that his PCP wants him to take Levemir 35 units QHS but he can not take that much because of lows.  Inquired about prior A1C and patient reports not being able to recall last A1C value and not sure what an A1 is. Explained what an A1C is and discussed A1C results (10.1% on 05/17/22).  Discussed glucose and A1C goals. Discussed importance of checking CBGs and maintaining good CBG control to prevent long-term and short-term complications.  Stressed to the patient the importance of improving glycemic control to prevent further complications from uncontrolled diabetes. Patient reports that he takes his glucometer to appointments with PCP. Encouraged patient to be sure to check glucose when he gets symptoms of hypoglycemia. Discussed treatment of hypoglycemia and asked that he be sure to tell PCP about the lows. Asked that he check glucose at least 2-3 times per day. Patient states he has everything he needs for DM management.  Patient verbalized understanding of information discussed and reports no further questions at this time related to diabetes. Ordered Living Well with DM book (Spanish).  Thanks, Barnie Alderman, RN, MSN, [place] Diabetes Coordinator Inpatient Diabetes Program [phone] (Team Pager from 8am to [place])

## 2022-05-26 NOTE — Care Management Important Message (Signed)
Important Message  Patient Details  Name: Jimmy Olson MRN: [id number] Date of Birth: 05-30-1961   Medicare Important Message Given:  Yes  Patient out of room for procedure for time of visit.  Copy of Medicare IM (both English and Spanish versions) left in room for reference.   Dannette Barbara 05/26/2022, 12:40 PM

## 2022-05-27 DIAGNOSIS — I5043 Acute on chronic combined systolic (congestive) and diastolic (congestive) heart failure: Secondary | ICD-10-CM | POA: Diagnosis not present

## 2022-05-27 DIAGNOSIS — I25118 Atherosclerotic heart disease of native coronary artery with other forms of angina pectoris: Secondary | ICD-10-CM

## 2022-05-27 DIAGNOSIS — N186 End stage renal disease: Secondary | ICD-10-CM | POA: Diagnosis not present

## 2022-05-27 DIAGNOSIS — I214 Non-ST elevation (NSTEMI) myocardial infarction: Secondary | ICD-10-CM | POA: Diagnosis not present

## 2022-05-27 LAB — CBC
HCT: 30.3 % — ABNORMAL LOW (ref 39.0–52.0)
Hemoglobin: 10.5 g/dL — ABNORMAL LOW (ref 13.0–17.0)
MCH: 30.9 pg (ref 26.0–34.0)
MCHC: 34.7 g/dL (ref 30.0–36.0)
MCV: 89.1 fL (ref 80.0–100.0)
Platelets: 203 10*3/uL (ref 150–400)
RBC: 3.4 MIL/uL — ABNORMAL LOW (ref 4.22–5.81)
RDW: 12.3 % (ref 11.5–15.5)
WBC: 5.4 10*3/uL (ref 4.0–10.5)
nRBC: 0 % (ref 0.0–0.2)

## 2022-05-27 LAB — RENAL FUNCTION PANEL
Albumin: 3.2 g/dL — ABNORMAL LOW (ref 3.5–5.0)
Anion gap: 9 (ref 5–15)
BUN: 38 mg/dL — ABNORMAL HIGH (ref 6–20)
CO2: 25 mmol/L (ref 22–32)
Calcium: 9.1 mg/dL (ref 8.9–10.3)
Chloride: 101 mmol/L (ref 98–111)
Creatinine, Ser: 6.11 mg/dL — ABNORMAL HIGH (ref 0.61–1.24)
GFR, Estimated: 10 mL/min — ABNORMAL LOW (ref >60)
Glucose, Bld: 79 mg/dL (ref 70–99)
Phosphorus: 6.4 mg/dL — ABNORMAL HIGH (ref 2.5–4.6)
Potassium: 4.2 mmol/L (ref 3.5–5.1)
Sodium: 135 mmol/L (ref 135–145)

## 2022-05-27 LAB — GLUCOSE, CAPILLARY
Glucose-Capillary: 214 mg/dL — ABNORMAL HIGH (ref 70–99)
Glucose-Capillary: 156 mg/dL — ABNORMAL HIGH (ref 70–99)
Glucose-Capillary: 159 mg/dL — ABNORMAL HIGH (ref 70–99)

## 2022-05-27 MED ORDER — HEPARIN SODIUM (PORCINE) 1000 UNIT/ML IJ SOLN
INTRAMUSCULAR | Status: AC
  Filled 2022-05-27: qty 10

## 2022-05-27 NOTE — Progress Notes (Signed)
[facility] Progress Note  Patient: Jimmy Olson    ENI:[id number]  DOA: 05/22/2022    Date of Service: the patient was seen and examined on 05/27/2022  Brief hospital course: Patient with ESRD on dialysis admitted on the early morning 12/5 with flash pulmonary edema and hypertensive urgency with elevated troponin secondary to non-STEMI.  Nephrology and cardiology were consulted on admission.  Patient just discharged on 12/1 for volume overload and hypertensive emergency.  Stabilized with diuresis and dialysis.  Patient underwent cardiac catheterization on 12/8 noting progression of mid LAD stenosis to 80% but heavily calcified.  Plan is for outpatient staged LAD PCI with atherectomy at [facility].   Assessment and Plan: * Acute on chronic combined systolic and diastolic CHF (congestive heart failure) (HCC) volume is managed by Lasix and dialysis.  Blood pressures are still elevated, but improved.     Hypertensive urgency - This could be the culprit for his acute pulmonary edema.  Slowly improving - Weaned off of Cardene drip.  Continued on home antihypertensives plus volume is managed by dialysis.   Non-ST elevation (NSTEMI) myocardial infarction [facility]) Status post cardiac cath 12/8 noting LAD stenosis of 80% but calcified.  Plan is for outpatient PCI with atherectomy at [facility], will plan for appointment on Wednesday, 12/20  End-stage renal disease on hemodialysis [facility]) - Seen by nephrology and had received urgent dialysis x2, most recently today.  Dyslipidemia - We will continue statin therapy.  GERD without esophagitis - We will continue PPI therapy  Acute metabolic encephalopathy Following admission, patient had episodes of hypoglycemia likely brought on by overcompensation from sliding scale.  See below.  Improved with D50.  Diabetes mellitus: Appreciate diabetes coordinator help.  Levemir adjusted.  Overweight: Meets criteria with BMI greater than  25  Body mass index is 22.84 kg/m.        Consultants: Cardiology Diabetes coordinator Nephrology  Procedures: Cardiac catheterization Hemodialysis  Antimicrobials: None  Code Status: Full code   Subjective: Fatigued  Objective: Blood pressures more manageable Vitals:   05/27/22 1133 05/27/22 1217  BP: (!) 176/91 (!) 177/88  Pulse: 80 76  Resp: (!) 21 20  Temp:  98.2 F (36.8 C)  SpO2: 100% 100%    Intake/Output Summary (Last 24 hours) at 05/27/2022 1451 Last data filed at 05/27/2022 1130 Gross per 24 hour  Intake 0 ml  Output 1500 ml  Net -1500 ml    Filed Weights   05/27/22 0555 05/27/22 0730 05/27/22 1137  Weight: 67.4 kg 66.3 kg 64.2 kg   Body mass index is 22.84 kg/m.  Exam:  General: Oriented x 2-3 HEENT: Normocephalic, atraumatic, mucous membranes slightly dry Cardiovascular: Regular rate and rhythm, S1-S2, 2 out of 6 systolic ejection murmur Respiratory: Decreased breath sounds bibasilar Abdomen: Soft, nontender, nondistended, positive bowel sounds Musculoskeletal: No clubbing or cyanosis, trace pitting edema Skin: No skin breaks, tears or lesions, fistula noted Psychiatry: Appropriate, no evidence of psychoses Neurology: No focal deficits  Data Reviewed: Hemoglobin of 10.5  Disposition:  Status is: Inpatient Remains inpatient appropriate because:  Monitoring patient post cath.  He had some hypotension during procedure.    Anticipated discharge date: 12/10  Family Communication: Will call daughter DVT Prophylaxis: Eliquis    Author: Annita Brod ,MD 05/27/2022 2:51 PM  To reach On-call, see care teams to locate the attending and reach out via [email or web address]. Between 7PM-7AM, please contact night-coverage If you still have difficulty reaching the attending provider, please page the [facility] (  Director on Call) for [facility] on [place] for assistance.

## 2022-05-27 NOTE — Progress Notes (Signed)
Catheter lines reversed.

## 2022-05-27 NOTE — Progress Notes (Signed)
[facility]  ROUNDING NOTE   Subjective:   Jimmy Olson  is a 61 year old Hispanic male with past medical conditions including anemia, two-vessel CAD, diabetes, depression, DVT with Eliquis, and end-stage renal disease on hemodialysis.  Patient presents to the emergency department with complaints of shortness of breath.  He has been admitted for Shortness of breath [R06.02] Flash pulmonary edema ([place]) [J81.0] Hypertensive urgency [I16.0] Hypertensive emergency [I16.1] Chest pain, unspecified type [R07.9]  Patient is known to our practice and receives outpatient dialysis treatments at [facility] on a TTS schedule, supervised by Dr. Candiss Norse.   Patient seen during dialysis.  Tolerating well.   HEMODIALYSIS FLOWSHEET:  Blood Flow Rate (mL/min): 400 mL/min Arterial Pressure (mmHg): -130 mmHg Venous Pressure (mmHg): 170 mmHg TMP (mmHg): 5 mmHg Ultrafiltration Rate (mL/min): 470 mL/min Dialysate Flow Rate (mL/min): 300 ml/min Dialysis Fluid Bolus: Normal Saline Bolus Amount (mL): 300 mL    Objective:  Vital signs in last 24 hours:  Temp:  [97.7 F (36.5 C)-98.3 F (36.8 C)] 98.2 F (36.8 C) (12/09 1217) Pulse Rate:  [68-85] 76 (12/09 1217) Resp:  [12-24] 20 (12/09 1217) BP: (108-186)/(63-104) 177/88 (12/09 1217) SpO2:  [98 %-100 %] 100 % (12/09 1217) Weight:  [64.2 kg-67.4 kg] 64.2 kg (12/09 1137)  Weight change: 1.9 kg Filed Weights   05/27/22 0555 05/27/22 0730 05/27/22 1137  Weight: 67.4 kg 66.3 kg 64.2 kg    Intake/Output: I/O last 3 completed shifts: In: 570.7 [P.O.:480; I.V.:90.7] Out: 625 [Urine:625]   Intake/Output this shift:  Total I/O In: -  Out: 1000 [Other:1000]  Physical Exam: General: NAD  Head: Normocephalic, atraumatic. Moist oral mucosal membranes  Eyes: Anicteric  Lungs:  Clear to auscultation, normal effort, room air  Heart: Regular rate and rhythm  Abdomen:  Soft, nontender  Extremities: Trace peripheral edema.   Neurologic: Nonfocal, moving all four extremities  Skin: No lesions  Access: Right chest PermCath    Basic Metabolic Panel: Recent Labs  Lab 05/22/22 2338 05/23/22 0601 05/24/22 0544 05/25/22 0717 05/26/22 0747 05/26/22 0942 05/26/22 0944 05/27/22 0404  NA 139 138 136 136 138 134* 135 135  K 4.5 3.7 3.5 3.5 3.8 3.6 3.6 4.2  CL 105 106 102 101 101  --   --  101  CO2 25 23 27 27 28   --   --  25  GLUCOSE 294* 395* 136* 77 78  --   --  79  BUN 51* 58* 28* 38* 27*  --   --  38*  CREATININE 5.61* 5.68* 4.00* 5.48* 4.65*  --   --  6.11*  CALCIUM 8.8* 8.3* 8.1* 8.9 9.1  --   --  9.1  MG 2.1  --   --   --   --   --   --   --   PHOS  --   --   --  5.6*  --   --   --  6.4*     Liver Function Tests: Recent Labs  Lab 05/25/22 0717 05/27/22 0404  ALBUMIN 3.4* 3.2*    No results for input(s): "LIPASE", "AMYLASE" in the last 168 hours. No results for input(s): "AMMONIA" in the last 168 hours.  CBC: Recent Labs  Lab 05/23/22 0601 05/24/22 0544 05/25/22 0345 05/26/22 0747 05/26/22 0942 05/26/22 0944 05/27/22 0404  WBC 5.6 4.8 5.2 4.7  --   --  5.4  HGB 9.1* 9.9* 10.3* 11.2* 9.5* 9.9* 10.5*  HCT 27.1* 29.1* 29.1* 32.7* 28.0*  29.0* 30.3*  MCV 91.2 90.1 89.0 89.3  --   --  89.1  PLT 174 183 202 217  --   --  203     Cardiac Enzymes: No results for input(s): "CKTOTAL", "CKMB", "CKMBINDEX", "TROPONINI" in the last 168 hours.  BNP: Invalid input(s): "POCBNP"  CBG: Recent Labs  Lab 05/26/22 1017 05/26/22 1221 05/26/22 1626 05/26/22 2010/09/10 05/27/22 1209  GLUCAP 114* 153* 318* 120* 92*     Microbiology: Results for orders placed or performed during the hospital encounter of 05/22/22  Resp Panel by RT-PCR (Flu A&B, Covid) Anterior Nasal Swab     Status: None   Collection Time: 05/22/22 11:58 PM   Specimen: Anterior Nasal Swab  Result Value Ref Range Status   SARS Coronavirus 2 by RT PCR NEGATIVE NEGATIVE Final    Comment: (NOTE) SARS-CoV-2 target nucleic acids  are NOT DETECTED.  The SARS-CoV-2 RNA is generally detectable in upper respiratory specimens during the acute phase of infection. The lowest concentration of SARS-CoV-2 viral copies this assay can detect is 138 copies/mL. A negative result does not preclude SARS-Cov-2 infection and should not be used as the sole basis for treatment or other patient management decisions. A negative result may occur with  improper specimen collection/handling, submission of specimen other than nasopharyngeal swab, presence of viral mutation(s) within the areas targeted by this assay, and inadequate number of viral copies(<138 copies/mL). A negative result must be combined with clinical observations, patient history, and epidemiological information. The expected result is Negative.  Fact Sheet for Patients:  EntrepreneurPulse.com.au  Fact Sheet for Healthcare Providers:  IncredibleEmployment.be  This test is no t yet approved or cleared by the [place] FDA and  has been authorized for detection and/or diagnosis of SARS-CoV-2 by FDA under an Emergency Use Authorization (EUA). This EUA will remain  in effect (meaning this test can be used) for the duration of the COVID-19 declaration under Section 564(b)(1) of the Act, 21 U.S.C.section 360bbb-3(b)(1), unless the authorization is terminated  or revoked sooner.       Influenza A by PCR NEGATIVE NEGATIVE Final   Influenza B by PCR NEGATIVE NEGATIVE Final    Comment: (NOTE) The Xpert Xpress SARS-CoV-2/FLU/RSV plus assay is intended as an aid in the diagnosis of influenza from Nasopharyngeal swab specimens and should not be used as a sole basis for treatment. Nasal washings and aspirates are unacceptable for Xpert Xpress SARS-CoV-2/FLU/RSV testing.  Fact Sheet for Patients: EntrepreneurPulse.com.au  Fact Sheet for Healthcare Providers: IncredibleEmployment.be  This test is  not yet approved or cleared by the [place] FDA and has been authorized for detection and/or diagnosis of SARS-CoV-2 by FDA under an Emergency Use Authorization (EUA). This EUA will remain in effect (meaning this test can be used) for the duration of the COVID-19 declaration under Section 564(b)(1) of the Act, 21 U.S.C. section 360bbb-3(b)(1), unless the authorization is terminated or revoked.  Performed at [facility], [address] [place], [place] [address]     Coagulation Studies: Recent Labs    05/25/22 Sep 10, 2231  LABPROT 12.8  INR 1.0     Urinalysis: No results for input(s): "COLORURINE", "LABSPEC", "PHURINE", "GLUCOSEU", "HGBUR", "BILIRUBINUR", "KETONESUR", "PROTEINUR", "UROBILINOGEN", "NITRITE", "LEUKOCYTESUR" in the last 72 hours.  Invalid input(s): "APPERANCEUR"    Imaging: CARDIAC CATHETERIZATION  Result Date: 05/26/2022   Prox LAD lesion is 40% stenosed.   Prox RCA lesion is 20% stenosed.   Mid RCA lesion is 30% stenosed.   Dist RCA lesion is  30% stenosed.   1st Diag lesion is 70% stenosed.   RPDA lesion is 40% stenosed.   Mid LAD lesion is 80% stenosed.   Ost LAD to Prox LAD lesion is 40% stenosed.   There is moderate left ventricular systolic dysfunction.   LV end diastolic pressure is normal. 1.  Severely calcified coronary arteries with significant one-vessel coronary artery disease.  Progression of mid LAD stenosis to 80%. 2.  Moderately reduced LV systolic function with an EF of 35%. 3.  Right heart catheterization showed low filling pressures, normal pulmonary pressure and mildly reduced cardiac output.  RA: 3 mmHg, RV: 19/2 mmHg, PW: 5 mmHg, PA: 16/6 with a mean of 10 mmHg, cardiac output is 4.7 with an index of 2.5. 4.  Transient hypotension during the procedure likely due to sedation and volume depletion.  The patient was given 250 mL of normal saline bolus and Levophed was used during the case and weaned off at the end. Recommendations: The patient  is volume depleted.  I discontinued IV furosemide. Continue aggressive medical therapy. Recommend outpatient LAD atherectomy and stent placement.  This can be done by me at [facility] in the next 2 weeks.    Medications:    sodium chloride      carvedilol  25 mg Oral BID WC   Chlorhexidine Gluconate Cloth  6 each Topical Q0600   escitalopram  10 mg Oral QHS   heparin sodium (porcine)       hydrALAZINE  100 mg Oral BID   insulin aspart  0-5 Units Subcutaneous QHS   insulin aspart  0-9 Units Subcutaneous TID WC   insulin aspart  2 Units Subcutaneous TID WC   insulin detemir  12 Units Subcutaneous QHS   isosorbide mononitrate  30 mg Oral BID   losartan  100 mg Oral Daily   pantoprazole  20 mg Oral Daily   rosuvastatin  10 mg Oral Daily   sodium chloride flush  3 mL Intravenous Q12H   sodium chloride flush  3 mL Intravenous Q12H   sodium chloride, acetaminophen **OR** acetaminophen, albuterol, heparin sodium (porcine), magnesium hydroxide, nitroGLYCERIN, ondansetron **OR** ondansetron (ZOFRAN) IV, sodium chloride flush, traZODone  Assessment/ Plan:  Jimmy Olson is a 61 y.o.  male with past medical conditions including anemia, two-vessel CAD, diabetes, depression, DVT with Eliquis, and end-stage renal disease on hemodialysis.  Patient presents to the emergency department with complaints of shortness of breath.  He has been admitted for Shortness of breath [R06.02] Flash pulmonary edema ([place]) [J81.0] Hypertensive urgency [I16.0] Hypertensive emergency [I16.1] Chest pain, unspecified type [R07.9]  [facility]/TTS/right chest PermCath/69.0 kg  End-stage renal disease on hemodialysis.   Patient seen during dialysis Tolerating well . UF goal 1 L  2. Anemia of chronic kidney disease  Lab Results  Component Value Date   HGB 10.5 (L) 05/27/2022    Patient receives [place] outpatient.  Hemoglobin within desired target.  3. Secondary Hyperparathyroidism: with  outpatient labs: PTH 331, phosphorus 4.8, calcium 8.8 on 05/02/22.   Lab Results  Component Value Date   CALCIUM 9.1 05/27/2022   CAION 1.19 05/26/2022   PHOS 6.4 (H) 05/27/2022  Will continue to monitor bone minerals during this admission.  Phosphorus slightly elevated.  4. Diabetes mellitus type II with chronic kidney disease/renal manifestations: insulin dependent. Home regimen includes NovoLog, Humulog, Levemir. Most recent hemoglobin A1c is 10.1 on 07/14/21.  Glucose well-controlled today, primary team to manage sliding scale insulin.  5.  NSTEMI with CAD. Cardiology completed cardiac catheterization today.  Patient's cath reports indicate severe progression of mid LAD stenosis to 80%. Moderately reduced LV systolic function with an EF of 35%. Outpatient LAD atherectomy and stent advised.    LOS: 4 Jesusita Jocelyn 12/9/20231:17 PM

## 2022-05-27 NOTE — Progress Notes (Signed)
BFR decreased to 350 d/t ap and vp alarms.

## 2022-05-27 NOTE — Progress Notes (Addendum)
Rounding Note    Patient Name: Jimmy Olson Date of Encounter: 05/27/2022  [place] Cardiologist: Ida Rogue, MD   Subjective   NAEO. Family at bedside. Jimmy Olson (video interpreter) assisted with visit.   Inpatient Medications    Scheduled Meds:  carvedilol  25 mg Oral BID WC   Chlorhexidine Gluconate Cloth  6 each Topical Q0600   escitalopram  10 mg Oral QHS   heparin sodium (porcine)       hydrALAZINE  100 mg Oral BID   insulin aspart  0-5 Units Subcutaneous QHS   insulin aspart  0-9 Units Subcutaneous TID WC   insulin aspart  2 Units Subcutaneous TID WC   insulin detemir  12 Units Subcutaneous QHS   isosorbide mononitrate  30 mg Oral BID   losartan  100 mg Oral Daily   pantoprazole  20 mg Oral Daily   rosuvastatin  10 mg Oral Daily   sodium chloride flush  3 mL Intravenous Q12H   sodium chloride flush  3 mL Intravenous Q12H   Continuous Infusions:  sodium chloride     PRN Meds: sodium chloride, acetaminophen **OR** acetaminophen, albuterol, heparin sodium (porcine), magnesium hydroxide, nitroGLYCERIN, ondansetron **OR** ondansetron (ZOFRAN) IV, sodium chloride flush, traZODone   Vital Signs    Vitals:   05/27/22 1130 05/27/22 1133 05/27/22 1137 05/27/22 1217  BP: (!) 145/87 (!) 176/91  (!) 177/88  Pulse: 78 80  76  Resp: 17 (!) 21  20  Temp: 98 F (36.7 C)   98.2 F (36.8 C)  TempSrc: Oral     SpO2: 100% 100%  100%  Weight:   64.2 kg   Height:        Intake/Output Summary (Last 24 hours) at 05/27/2022 1313 Last data filed at 05/27/2022 1130 Gross per 24 hour  Intake 480 ml  Output 1500 ml  Net -1020 ml      05/27/2022   11:37 AM 05/27/2022    7:30 AM 05/27/2022    5:55 AM  Last 3 Weights  Weight (lbs) 141 lb 8.6 oz 146 lb 2.6 oz 148 lb 9.4 oz  Weight (kg) 64.2 kg 66.3 kg 67.4 kg      Telemetry    Personally Reviewed  ECG    Personally Reviewed  Physical Exam   GEN: No acute distress.   Neck: No JVD Cardiac: RRR, no  murmurs, rubs, or gallops.  Respiratory: Clear to auscultation bilaterally. GI: Soft, nontender, non-distended  MS: No edema; No deformity. Neuro:  Nonfocal  Psych: Normal affect   Labs    High Sensitivity Troponin:   Recent Labs  Lab 05/18/22 0850 05/19/22 0218 05/19/22 0407 05/22/22 2338 05/23/22 0209  TROPONINIHS 194* 202* 197* 478* 462*     Chemistry Recent Labs  Lab 05/22/22 2338 05/23/22 0601 05/25/22 0717 05/26/22 0747 05/26/22 0942 05/26/22 0944 05/27/22 0404  NA 139   < > 136 138 134* 135 135  K 4.5   < > 3.5 3.8 3.6 3.6 4.2  CL 105   < > 101 101  --   --  101  CO2 25   < > 27 28  --   --  25  GLUCOSE 294*   < > 77 78  --   --  79  BUN 51*   < > 38* 27*  --   --  38*  CREATININE 5.61*   < > 5.48* 4.65*  --   --  6.11*  CALCIUM 8.8*   < >  8.9 9.1  --   --  9.1  MG 2.1  --   --   --   --   --   --   ALBUMIN  --   --  3.4*  --   --   --  3.2*  GFRNONAA 11*   < > 11* 14*  --   --  10*  ANIONGAP 9   < > 8 9  --   --  9   < > = values in this interval not displayed.    Lipids No results for input(s): "CHOL", "TRIG", "HDL", "LABVLDL", "LDLCALC", "CHOLHDL" in the last 168 hours.  Hematology Recent Labs  Lab 05/25/22 0345 05/26/22 0747 05/26/22 0942 05/26/22 0944 05/27/22 0404  WBC 5.2 4.7  --   --  5.4  RBC 3.27* 3.66*  --   --  3.40*  HGB 10.3* 11.2* 9.5* 9.9* 10.5*  HCT 29.1* 32.7* 28.0* 29.0* 30.3*  MCV 89.0 89.3  --   --  89.1  MCH 31.5 30.6  --   --  30.9  MCHC 35.4 34.3  --   --  34.7  RDW 12.3 12.4  --   --  12.3  PLT 202 217  --   --  203   Thyroid No results for input(s): "TSH", "FREET4" in the last 168 hours.  BNP Recent Labs  Lab 05/22/22 2338  BNP 3,889.8*    DDimer No results for input(s): "DDIMER" in the last 168 hours.   Radiology    CARDIAC CATHETERIZATION  Result Date: 05/26/2022   Prox LAD lesion is 40% stenosed.   Prox RCA lesion is 20% stenosed.   Mid RCA lesion is 30% stenosed.   Dist RCA lesion is 30% stenosed.   1st  Diag lesion is 70% stenosed.   RPDA lesion is 40% stenosed.   Mid LAD lesion is 80% stenosed.   Ost LAD to Prox LAD lesion is 40% stenosed.   There is moderate left ventricular systolic dysfunction.   LV end diastolic pressure is normal. 1.  Severely calcified coronary arteries with significant one-vessel coronary artery disease.  Progression of mid LAD stenosis to 80%. 2.  Moderately reduced LV systolic function with an EF of 35%. 3.  Right heart catheterization showed low filling pressures, normal pulmonary pressure and mildly reduced cardiac output.  RA: 3 mmHg, RV: 19/2 mmHg, PW: 5 mmHg, PA: 16/6 with a mean of 10 mmHg, cardiac output is 4.7 with an index of 2.5. 4.  Transient hypotension during the procedure likely due to sedation and volume depletion.  The patient was given 250 mL of normal saline bolus and Levophed was used during the case and weaned off at the end. Recommendations: The patient is volume depleted.  I discontinued IV furosemide. Continue aggressive medical therapy. Recommend outpatient LAD atherectomy and stent placement.  This can be done by me at [facility] in the next 2 weeks.     Assessment & Plan    #Acute on chronic combined systolic/diastolic HF Volume management with iHD Cont coreg, hydral, losarta, imdur  #CAD #NSTEMI Planning for follow up [facility] with PCI with MA as outpatient in [place].  #Pulm Edema #ESRD Volume management with iHD  #HTN Elevated. Management per IM. iHD assisting.  OK to discharge. Message sent to Dr Fletcher Anon to make sure follow up [place] is arranged.  For questions or updates, please contact [place] Please consult [email or web address] for contact info under  Signed, Vickie Epley, MD  05/27/2022, 1:13 PM

## 2022-05-27 NOTE — Progress Notes (Signed)
Post hd rn assessment 

## 2022-05-27 NOTE — Progress Notes (Signed)
Pre hd rn assessment 

## 2022-05-27 NOTE — Progress Notes (Signed)
Pt completed 3.5 hour HD treatment w/ no complications. Alert, vss, no c/o, report to primary RN.  Start: 0739 End: 1130 1035ml fluid removed 75L BVP 64.2kg post HD bed weight No HD meds ordered

## 2022-05-27 NOTE — Plan of Care (Signed)
  Problem: Activity: Goal: Capacity to carry out activities will improve Outcome: Progressing   Problem: Cardiac: Goal: Ability to achieve and maintain adequate cardiopulmonary perfusion will improve Outcome: Progressing   

## 2022-05-28 DIAGNOSIS — I214 Non-ST elevation (NSTEMI) myocardial infarction: Secondary | ICD-10-CM | POA: Diagnosis not present

## 2022-05-28 DIAGNOSIS — I160 Hypertensive urgency: Secondary | ICD-10-CM | POA: Diagnosis not present

## 2022-05-28 DIAGNOSIS — N186 End stage renal disease: Secondary | ICD-10-CM | POA: Diagnosis not present

## 2022-05-28 DIAGNOSIS — I5043 Acute on chronic combined systolic (congestive) and diastolic (congestive) heart failure: Secondary | ICD-10-CM | POA: Diagnosis not present

## 2022-05-28 LAB — GLUCOSE, CAPILLARY
Glucose-Capillary: 144 mg/dL — ABNORMAL HIGH (ref 70–99)
Glucose-Capillary: 294 mg/dL — ABNORMAL HIGH (ref 70–99)

## 2022-05-28 MED ORDER — NITROGLYCERIN 0.4 MG SL SUBL: 0.4000 mg | SUBLINGUAL_TABLET | SUBLINGUAL | 0 refills | Status: DC | PRN

## 2022-05-28 MED ORDER — APIXABAN 5 MG PO TABS
5.0000 mg | ORAL_TABLET | Freq: Two times a day (BID) | ORAL | Status: DC
  Administered 2022-05-28: 5 mg via ORAL
  Filled 2022-05-28: qty 1

## 2022-05-28 MED ORDER — LEVEMIR FLEXPEN 100 UNIT/ML ~~LOC~~ SOPN: 15.0000 [IU] | PEN_INJECTOR | Freq: Every day | SUBCUTANEOUS | 5 refills | Status: DC

## 2022-05-28 NOTE — Progress Notes (Signed)
[facility]  ROUNDING NOTE   Subjective:   Jimmy Olson  is a 61 year old Hispanic male with past medical conditions including anemia, two-vessel CAD, diabetes, depression, DVT with Eliquis, and end-stage renal disease on hemodialysis.  Patient presents to the emergency department with complaints of shortness of breath.  He has been admitted for Shortness of breath [R06.02] Flash pulmonary edema ([place]) [J81.0] Hypertensive urgency [I16.0] Hypertensive emergency [I16.1] Chest pain, unspecified type [R07.9]  Patient is known to our practice and receives outpatient dialysis treatments at [facility] on a TTS schedule, supervised by Dr. Candiss Norse.   Patient underwent hemodialysis treatment yesterday.  Tolerated well. Resting comfortably in bed at the moment.   Objective:  Vital signs in last 24 hours:  Temp:  [97.8 F (36.6 C)-98.5 F (36.9 C)] 97.9 F (36.6 C) (12/10 0734) Pulse Rate:  [74-85] 77 (12/10 0734) Resp:  [16-20] 16 (12/10 0734) BP: (107-177)/(75-89) 162/88 (12/10 0734) SpO2:  [100 %] 100 % (12/10 0734) Weight:  [64.9 kg] 64.9 kg (12/10 0500)  Weight change: -1.1 kg Filed Weights   05/27/22 0730 05/27/22 1137 05/28/22 0500  Weight: 66.3 kg 64.2 kg 64.9 kg    Intake/Output: I/O last 3 completed shifts: In: -  Out: 1000 [Other:1000]   Intake/Output this shift:  Total I/O In: 240 [P.O.:240] Out: 0   Physical Exam: General: NAD  Head: Normocephalic, atraumatic. Moist oral mucosal membranes  Eyes: Anicteric  Lungs:  Clear to auscultation, normal effort, room air  Heart: Regular rate and rhythm  Abdomen:  Soft, nontender  Extremities: Trace peripheral edema.  Neurologic: Nonfocal, moving all four extremities  Skin: No lesions  Access: Right chest PermCath    Basic Metabolic Panel: Recent Labs  Lab 05/22/22 2338 05/23/22 0601 05/24/22 0544 05/25/22 0717 05/26/22 0747 05/26/22 0942 05/26/22 0944 05/27/22 0404  NA 139 138 136  136 138 134* 135 135  K 4.5 3.7 3.5 3.5 3.8 3.6 3.6 4.2  CL 105 106 102 101 101  --   --  101  CO2 25 23 27 27 28   --   --  25  GLUCOSE 294* 395* 136* 77 78  --   --  79  BUN 51* 58* 28* 38* 27*  --   --  38*  CREATININE 5.61* 5.68* 4.00* 5.48* 4.65*  --   --  6.11*  CALCIUM 8.8* 8.3* 8.1* 8.9 9.1  --   --  9.1  MG 2.1  --   --   --   --   --   --   --   PHOS  --   --   --  5.6*  --   --   --  6.4*     Liver Function Tests: Recent Labs  Lab 05/25/22 0717 05/27/22 0404  ALBUMIN 3.4* 3.2*    No results for input(s): "LIPASE", "AMYLASE" in the last 168 hours. No results for input(s): "AMMONIA" in the last 168 hours.  CBC: Recent Labs  Lab 05/23/22 0601 05/24/22 0544 05/25/22 0345 05/26/22 0747 05/26/22 0942 05/26/22 0944 05/27/22 0404  WBC 5.6 4.8 5.2 4.7  --   --  5.4  HGB 9.1* 9.9* 10.3* 11.2* 9.5* 9.9* 10.5*  HCT 27.1* 29.1* 29.1* 32.7* 28.0* 29.0* 30.3*  MCV 91.2 90.1 89.0 89.3  --   --  89.1  PLT 174 183 202 217  --   --  203     Cardiac Enzymes: No results for input(s): "CKTOTAL", "CKMB", "CKMBINDEX", "TROPONINI"  in the last 168 hours.  BNP: Invalid input(s): "POCBNP"  CBG: Recent Labs  Lab 05/26/22 2010/09/21 05/27/22 1209 05/27/22 1547 05/27/22 09/20/2113 05/28/22 0731  GLUCAP 120* 214* 156* 159* 144*     Microbiology: Results for orders placed or performed during the hospital encounter of 05/22/22  Resp Panel by RT-PCR (Flu A&B, Covid) Anterior Nasal Swab     Status: None   Collection Time: 05/22/22 11:58 PM   Specimen: Anterior Nasal Swab  Result Value Ref Range Status   SARS Coronavirus 2 by RT PCR NEGATIVE NEGATIVE Final    Comment: (NOTE) SARS-CoV-2 target nucleic acids are NOT DETECTED.  The SARS-CoV-2 RNA is generally detectable in upper respiratory specimens during the acute phase of infection. The lowest concentration of SARS-CoV-2 viral copies this assay can detect is 138 copies/mL. A negative result does not preclude SARS-Cov-2 infection  and should not be used as the sole basis for treatment or other patient management decisions. A negative result may occur with  improper specimen collection/handling, submission of specimen other than nasopharyngeal swab, presence of viral mutation(s) within the areas targeted by this assay, and inadequate number of viral copies(<138 copies/mL). A negative result must be combined with clinical observations, patient history, and epidemiological information. The expected result is Negative.  Fact Sheet for Patients:  EntrepreneurPulse.com.au  Fact Sheet for Healthcare Providers:  IncredibleEmployment.be  This test is no t yet approved or cleared by the [place] FDA and  has been authorized for detection and/or diagnosis of SARS-CoV-2 by FDA under an Emergency Use Authorization (EUA). This EUA will remain  in effect (meaning this test can be used) for the duration of the COVID-19 declaration under Section 564(b)(1) of the Act, 21 U.S.C.section 360bbb-3(b)(1), unless the authorization is terminated  or revoked sooner.       Influenza A by PCR NEGATIVE NEGATIVE Final   Influenza B by PCR NEGATIVE NEGATIVE Final    Comment: (NOTE) The Xpert Xpress SARS-CoV-2/FLU/RSV plus assay is intended as an aid in the diagnosis of influenza from Nasopharyngeal swab specimens and should not be used as a sole basis for treatment. Nasal washings and aspirates are unacceptable for Xpert Xpress SARS-CoV-2/FLU/RSV testing.  Fact Sheet for Patients: EntrepreneurPulse.com.au  Fact Sheet for Healthcare Providers: IncredibleEmployment.be  This test is not yet approved or cleared by the [place] FDA and has been authorized for detection and/or diagnosis of SARS-CoV-2 by FDA under an Emergency Use Authorization (EUA). This EUA will remain in effect (meaning this test can be used) for the duration of the COVID-19 declaration  under Section 564(b)(1) of the Act, 21 U.S.C. section 360bbb-3(b)(1), unless the authorization is terminated or revoked.  Performed at [facility], [address] [place], [place] [address]     Coagulation Studies: Recent Labs    05/25/22 2231/09/21  LABPROT 12.8  INR 1.0     Urinalysis: No results for input(s): "COLORURINE", "LABSPEC", "PHURINE", "GLUCOSEU", "HGBUR", "BILIRUBINUR", "KETONESUR", "PROTEINUR", "UROBILINOGEN", "NITRITE", "LEUKOCYTESUR" in the last 72 hours.  Invalid input(s): "APPERANCEUR"    Imaging: No results found.   Medications:    sodium chloride      apixaban  5 mg Oral BID   carvedilol  25 mg Oral BID WC   Chlorhexidine Gluconate Cloth  6 each Topical Q0600   escitalopram  10 mg Oral QHS   hydrALAZINE  100 mg Oral BID   insulin aspart  0-5 Units Subcutaneous QHS   insulin aspart  0-9 Units Subcutaneous TID WC  insulin aspart  2 Units Subcutaneous TID WC   insulin detemir  12 Units Subcutaneous QHS   isosorbide mononitrate  30 mg Oral BID   losartan  100 mg Oral Daily   pantoprazole  20 mg Oral Daily   rosuvastatin  10 mg Oral Daily   sodium chloride flush  3 mL Intravenous Q12H   sodium chloride flush  3 mL Intravenous Q12H   sodium chloride, acetaminophen **OR** acetaminophen, albuterol, magnesium hydroxide, nitroGLYCERIN, ondansetron **OR** ondansetron (ZOFRAN) IV, sodium chloride flush, traZODone  Assessment/ Plan:  Jimmy Olson is a 61 y.o.  male with past medical conditions including anemia, two-vessel CAD, diabetes, depression, DVT with Eliquis, and end-stage renal disease on hemodialysis.  Patient presents to the emergency department with complaints of shortness of breath.  He has been admitted for Shortness of breath [R06.02] Flash pulmonary edema ([place]) [J81.0] Hypertensive urgency [I16.0] Hypertensive emergency [I16.1] Chest pain, unspecified type [R07.9]  [facility]/TTS/right chest PermCath/69.0  kg  End-stage renal disease on hemodialysis.   Patient underwent hemodialysis treatment yesterday.  No acute indication for dialysis today.  2. Anemia of chronic kidney disease  Lab Results  Component Value Date   HGB 10.5 (L) 05/27/2022    Patient receives [place] outpatient.  Hemoglobin at target at 10.5.  Continue.  At the monitor.  3. Secondary Hyperparathyroidism: with outpatient labs: PTH 331, phosphorus 4.8, calcium 8.8 on 05/02/22.   Lab Results  Component Value Date   CALCIUM 9.1 05/27/2022   CAION 1.19 05/26/2022   PHOS 6.4 (H) 05/27/2022  Phosphorus slightly elevated yesterday at 6.4.  Not currently on binders.  4. Diabetes mellitus type II with chronic kidney disease/renal manifestations: insulin dependent. Home regimen includes NovoLog, Humulog, Levemir. Most recent hemoglobin A1c is 10.1 on 07/14/21.  Glucose well-controlled today, primary team to manage sliding scale insulin.  5. NSTEMI with CAD. Cardiology completed cardiac catheterizatio. Patient's cath reports indicate severe progression of mid LAD stenosis to 80%. Moderately reduced LV systolic function with an EF of 35%. Outpatient LAD atherectomy and stent advised.    LOS: 5 Jaquese Irving 12/10/202312:00 PM

## 2022-05-28 NOTE — Discharge Summary (Signed)
Physician Discharge Summary   Patient: Jimmy Olson MRN: [id number] DOB: 14-Oct-1960  Admit date:     05/22/2022  Discharge date: 05/28/22  Discharge Physician: Annita Brod   PCP: [facility] [place]   Recommendations at discharge:   Patient will follow-up with [facility] cardiology at [facility] on 12/20 outpatient staged PCI with atherectomy of the LAD.  Cardiology will call patient with preop information.  Patient advised to be n.p.o. midnight before and only take 4 units of his long-acting insulin.  Discharge Diagnoses: Principal Problem:   Acute on chronic combined systolic and diastolic CHF (congestive heart failure) (HCC) Active Problems:   Hypertensive urgency   Non-ST elevation (NSTEMI) myocardial infarction [facility])   End-stage renal disease on hemodialysis ([place])   Coronary artery disease of native artery of native heart with stable angina pectoris (HCC)   Shortness of breath   GERD without esophagitis   Dyslipidemia   Flash pulmonary edema (HCC)  Resolved Problems:   * No resolved hospital problems. [facility] Course: Patient with ESRD on dialysis admitted on the early morning 12/5 with flash pulmonary edema and hypertensive urgency with elevated troponin secondary to non-STEMI.  Nephrology and cardiology were consulted on admission.  Patient just discharged on 12/1 for volume overload and hypertensive emergency.  Stabilized with diuresis and dialysis.  Patient underwent cardiac catheterization on 12/8 noting progression of mid LAD stenosis to 80% but heavily calcified.  Plan is for outpatient staged LAD PCI with atherectomy at [facility].  Assessment and Plan: * Acute on chronic combined systolic and diastolic CHF (congestive heart failure) (HCC)-resolved volume is managed by Lasix and dialysis.  Blood pressures are still elevated, but improved.       Hypertensive urgency-resolved - This could be the culprit for his acute pulmonary  edema.  Slowly improving - Weaned off of Cardene drip.  Continued on home antihypertensives plus volume is managed by dialysis.     Non-ST elevation (NSTEMI) myocardial infarction [facility]) Status post cardiac cath 12/8 noting LAD stenosis of 80% but calcified.  Plan is for outpatient PCI with atherectomy at [facility], will plan for appointment on Wednesday, 12/20   End-stage renal disease on hemodialysis [facility]) - Seen by nephrology and had received urgent dialysis x2, most recently today.   Dyslipidemia - We will continue statin therapy.   GERD without esophagitis - We will continue PPI therapy   Acute metabolic encephalopathy-resolved Following admission, patient had episodes of hypoglycemia likely brought on by overcompensation from sliding scale.  See below.  Improved with D50.   Diabetes mellitus: Appreciate diabetes coordinator help.  Levemir adjusted.   Overweight: Meets criteria with BMI greater than 25   Body mass index is 22.84 kg/m.       Consultants: Cardiology Diabetes coordinator Nephrology   Procedures: Cardiac catheterization Hemodialysis Disposition: Home Diet recommendation:  Discharge Diet Orders (From admission, onward)     Start     Ordered   05/28/22 0000  Diet - low sodium heart healthy        05/28/22 1151           Cardiac and Carb modified diet DISCHARGE MEDICATION: Allergies as of 05/28/2022   No Known Allergies      Medication List     STOP taking these medications    cefdinir 300 MG capsule Commonly known as: OMNICEF       TAKE these medications    acetaminophen 325 MG tablet Commonly known  as: TYLENOL Take 650 mg by mouth every 6 (six) hours as needed.   albuterol 108 (90 Base) MCG/ACT inhaler Commonly known as: Proventil HFA INHALE 2 PULSACIONES EN LOS PULMONES UNA VEZ CADA [place]. (INHALE 2 PUFFS INTO THE LUNGS ONCE EVERY 6 HOURS AS NEEDED FOR WHEEZING OR  SHORTNESS OF BREATH.)   carvedilol 25 MG tablet Commonly known as: COREG Tome 1 tableta (25 mg en total) por va oral 2 (dos) veces al [employer]. (Take 1 tablet (25 mg total) by mouth 2 (two) times daily.)   Comfort EZ Pen Needles 32G X 4 MM Misc Generic drug: Insulin Pen Needle Use como se indica. (USE AS DIRECTED)   Eliquis 5 MG Tabs tablet Generic drug: apixaban TOME 1 TABLETA POR BOCA DOS VECES AL DA. (TAKE 1 TABLET BY MOUTH TWICE DAILY.)   escitalopram 10 MG tablet Commonly known as: LEXAPRO Tome 1 tableta (10 mg en total) por va oral antes de acostarse. (Take 1 tablet (10 mg total) by mouth at bedtime.)   feeding supplement (NEPRO CARB STEADY) Liqd Take 237 mLs by mouth 3 (three) times daily between meals.   hydrALAZINE 100 MG tablet Commonly known as: APRESOLINE Take 1 tablet (100 mg total) by mouth 2 (two) times daily.   isosorbide mononitrate 30 MG 24 hr tablet Commonly known as: IMDUR Tome 1 tableta (30 mg en total) por va oral por la maana y al acostarse. (Take 1 tablet (30 mg total) by mouth in the morning and at bedtime.)   Levemir FlexPen 100 UNIT/ML FlexPen Generic drug: insulin detemir Inject 15 Units into the skin at bedtime. What changed: how much to take   losartan 100 MG tablet Commonly known as: COZAAR Tome 1 tableta (100 mg en total) por va oral diariamente. (Take 1 tablet (100 mg total) by mouth daily.)   nitroGLYCERIN 0.4 MG SL tablet Commonly known as: NITROSTAT Place 1 tablet (0.4 mg total) under the tongue every 5 (five) minutes as needed for chest pain.   NovoLOG FlexPen 100 UNIT/ML FlexPen Generic drug: insulin aspart Inject 0-15 Units into the skin 3 (three) times daily with meals. 0-15 units tid 0-5 units qhs   pantoprazole 20 MG tablet Commonly known as: Protonix Tome 1 tableta (20 mg en total) por va oral diariamente. (Take 1 tablet (20 mg total) by mouth daily.)   rosuvastatin 40 MG tablet Commonly known as: CRESTOR Tome 1  tableta (40 mg en total) por va oral diariamente. (Take 1 tablet (40 mg total) by mouth daily.)   torsemide 20 MG tablet Commonly known as: DEMADEX Take 1 tablet (20 mg total) by mouth daily. Takes on Sat and Sun        Follow-up Information     Wellington Hampshire, MD Follow up.   Specialty: Cardiology Why: Office will call you with details on where to go and what time Wednesday, 12/20.  Take only 4 units of your Levemir insulin that morning. Contact information: [place] [address] [phone]                Discharge Exam: Danley Danker Weights   05/27/22 0730 05/27/22 1137 05/28/22 0500  Weight: 66.3 kg 64.2 kg 64.9 kg   General: Alert and oriented x 3, no acute distress Cardiovascular: Regular rate and rhythm, S1-S2  Condition at discharge: fair  The results of significant diagnostics from this hospitalization (including imaging, microbiology, ancillary and laboratory)  are listed below for reference.   Imaging Studies: CARDIAC CATHETERIZATION  Result Date: 05/26/2022   Prox LAD lesion is 40% stenosed.   Prox RCA lesion is 20% stenosed.   Mid RCA lesion is 30% stenosed.   Dist RCA lesion is 30% stenosed.   1st Diag lesion is 70% stenosed.   RPDA lesion is 40% stenosed.   Mid LAD lesion is 80% stenosed.   Ost LAD to Prox LAD lesion is 40% stenosed.   There is moderate left ventricular systolic dysfunction.   LV end diastolic pressure is normal. 1.  Severely calcified coronary arteries with significant one-vessel coronary artery disease.  Progression of mid LAD stenosis to 80%. 2.  Moderately reduced LV systolic function with an EF of 35%. 3.  Right heart catheterization showed low filling pressures, normal pulmonary pressure and mildly reduced cardiac output.  RA: 3 mmHg, RV: 19/2 mmHg, PW: 5 mmHg, PA: 16/6 with a mean of 10 mmHg, cardiac output is 4.7 with an index of 2.5. 4.  Transient hypotension during the procedure likely due to sedation and  volume depletion.  The patient was given 250 mL of normal saline bolus and Levophed was used during the case and weaned off at the end. Recommendations: The patient is volume depleted.  I discontinued IV furosemide. Continue aggressive medical therapy. Recommend outpatient LAD atherectomy and stent placement.  This [facility] be done by me at [facility] in the next 2 weeks.  DG Chest Port 1 View  Result Date: 05/23/2022 CLINICAL DATA:  Dyspnea EXAM: PORTABLE CHEST 1 VIEW COMPARISON:  05/17/2022 FINDINGS: The lungs are symmetrically well expanded. No pneumothorax or pleural effusion. Mild cardiomegaly is stable. Diffuse interstitial pulmonary edema has progressed slightly in the interval and is now mild-to-moderate in severity. Right internal jugular hemodialysis catheter tip again noted at the superior cavoatrial junction. No acute bone abnormality. IMPRESSION: 1. Progressive interstitial pulmonary edema, now mild-to-moderate in severity. Electronically Signed   By: Fidela Salisbury M.D.   On: 05/23/2022 00:10   ECHOCARDIOGRAM COMPLETE  Result Date: 05/18/2022    ECHOCARDIOGRAM REPORT   Patient Name:   TIMM BONENBERGER Date of Exam: 05/18/2022 Medical Rec #:  [id number]     Height:       66.0 in Accession #:    [id number]    Weight:       147.3 lb Date of Birth:  08/24/1960     BSA:          1.756 m Patient Age:    5 years      BP:           181/99 mmHg Patient Gender: M             HR:           75 bpm. Exam Location:  [facility] Procedure: 2D Echo, Color Doppler, Cardiac Doppler and Intracardiac            Opacification Agent Indications:     I50.31 congestive heart failure-Acute Diastolic  History:         Patient has prior history of Echocardiogram examinations, most                  recent 08/08/2021. HFrEF, NICM, CAD, ESRD and TIA; Risk                  Factors:Hypertension, Diabetes and Dyslipidemia.  Sonographer:     Charmayne Sheer Referring Phys:  [id number] Athena Masse Diagnosing Phys: Christia Reading  Gollan MD  Sonographer  Comments: Suboptimal apical window. IMPRESSIONS  1. Left ventricular ejection fraction, by estimation, is 40 to 45%. The left ventricle has mildly decreased function. The left ventricle demonstrates global hypokinesis. There is mild left ventricular hypertrophy. Small region of noncompaction noted in the apical region, less likely mural thrombus (noted on prior studies). Left ventricular diastolic parameters are consistent with Grade II diastolic dysfunction (pseudonormalization).  2. Right ventricular systolic function is normal. The right ventricular size is normal. Tricuspid regurgitation signal is inadequate for assessing PA pressure.  3. The mitral valve is normal in structure. No evidence of mitral valve regurgitation. No evidence of mitral stenosis.  4. The aortic valve was not well visualized. Aortic valve regurgitation is not visualized. No aortic stenosis is present.  5. The inferior vena cava is normal in size with greater than 50% respiratory variability, suggesting right atrial pressure of 3 mmHg. FINDINGS  Left Ventricle: Left ventricular ejection fraction, by estimation, is 40 to 45%. The left ventricle has mildly decreased function. The left ventricle demonstrates global hypokinesis. Definity contrast agent was given IV to delineate the left ventricular  endocardial borders. The left ventricular internal cavity size was normal in size. There is mild left ventricular hypertrophy. Left ventricular diastolic parameters are consistent with Grade II diastolic dysfunction (pseudonormalization). Right Ventricle: The right ventricular size is normal. No increase in right ventricular wall thickness. Right ventricular systolic function is normal. Tricuspid regurgitation signal is inadequate for assessing PA pressure. Left Atrium: Left atrial size was normal in size. Right Atrium: Right atrial size was normal in size. Pericardium: There is no evidence of pericardial effusion. Mitral Valve: The mitral valve is  normal in structure. No evidence of mitral valve regurgitation. No evidence of mitral valve stenosis. Tricuspid Valve: The tricuspid valve is normal in structure. Tricuspid valve regurgitation is not demonstrated. No evidence of tricuspid stenosis. Aortic Valve: The aortic valve was not well visualized. Aortic valve regurgitation is not visualized. No aortic stenosis is present. Aortic valve mean gradient measures 2.0 mmHg. Aortic valve peak gradient measures 3.7 mmHg. Aortic valve area, by VTI measures 2.22 cm. Pulmonic Valve: The pulmonic valve was normal in structure. Pulmonic valve regurgitation is not visualized. No evidence of pulmonic stenosis. Aorta: The aortic root is normal in size and structure. Venous: The inferior vena cava is normal in size with greater than 50% respiratory variability, suggesting right atrial pressure of 3 mmHg. IAS/Shunts: No atrial level shunt detected by color flow Doppler.  LEFT VENTRICLE PLAX 2D LVIDd:         4.50 cm      Diastology LVIDs:         3.70 cm      LV e' medial:    5.55 cm/s LV PW:         1.40 cm      LV E/e' medial:  11.0 LV IVS:        1.40 cm      LV e' lateral:   6.31 cm/s LVOT diam:     1.90 cm      LV E/e' lateral: 9.6 LV SV:         33 LV SV Index:   19 LVOT Area:     2.84 cm  LV Volumes (MOD) LV vol d, MOD A2C: 101.0 ml LV vol d, MOD A4C: 117.0 ml LV vol s, MOD A2C: 51.5 ml LV vol s, MOD A4C: 66.0 ml LV SV MOD A2C:  49.5 ml LV SV MOD A4C:     117.0 ml LV SV MOD BP:      52.3 ml RIGHT VENTRICLE RV Basal diam:  2.70 cm TAPSE (M-mode): 1.8 cm LEFT ATRIUM             Index        RIGHT ATRIUM          Index LA diam:        3.90 cm 2.22 cm/m   RA Area:     7.95 cm LA Vol (A2C):   31.4 ml 17.88 ml/m  RA Volume:   13.20 ml 7.52 ml/m LA Vol (A4C):   25.0 ml 14.24 ml/m LA Biplane Vol: 28.0 ml 15.95 ml/m  AORTIC VALVE                    PULMONIC VALVE AV Area (Vmax):    1.97 cm     PV Vmax:       0.80 m/s AV Area (Vmean):   2.29 cm     PV Vmean:       55.100 cm/s AV Area (VTI):     2.22 cm     PV VTI:        0.138 m AV Vmax:           96.80 cm/s   PV Peak grad:  2.6 mmHg AV Vmean:          62.500 cm/s  PV Mean grad:  1.0 mmHg AV VTI:            0.147 m AV Peak Grad:      3.7 mmHg AV Mean Grad:      2.0 mmHg LVOT Vmax:         67.10 cm/s LVOT Vmean:        50.400 cm/s LVOT VTI:          0.115 m LVOT/AV VTI ratio: 0.78  AORTA Ao Root diam: 2.60 cm MITRAL VALVE MV Area (PHT): 3.72 cm    SHUNTS MV Decel Time: 204 msec    Systemic VTI:  0.12 m MV E velocity: 60.80 cm/s  Systemic Diam: 1.90 cm MV A velocity: 57.10 cm/s MV E/A ratio:  1.06 Ida Rogue MD Electronically signed by Ida Rogue MD Signature Date/Time: 05/18/2022/5:44:03 PM    Final    DG Chest 1 View  Result Date: 05/17/2022 CLINICAL DATA:  Cough and shortness of breath EXAM: CHEST  1 VIEW COMPARISON:  Radiographs 04/04/2022 FINDINGS: Stable cardiomegaly. Aortic atherosclerotic calcification. Right IJ CVC tip in the right atrium. Mild bilateral interstitial opacities. No focal consolidation, pleural effusion, or pneumothorax. IMPRESSION: Cardiomegaly.  Mild interstitial edema. Electronically Signed   By: Placido Sou M.D.   On: 05/17/2022 20:37    Microbiology: Results for orders placed or performed during the hospital encounter of 05/22/22  Resp Panel by RT-PCR (Flu A&B, Covid) Anterior Nasal Swab     Status: None   Collection Time: 05/22/22 11:58 PM   Specimen: Anterior Nasal Swab  Result Value Ref Range Status   SARS Coronavirus 2 by RT PCR NEGATIVE NEGATIVE Final    Comment: (NOTE) SARS-CoV-2 target nucleic acids are NOT DETECTED.  The SARS-CoV-2 RNA is generally detectable in upper respiratory specimens during the acute phase of infection. The lowest concentration of SARS-CoV-2 viral copies this assay can detect is 138 copies/mL. A negative result does not preclude SARS-Cov-2 infection and should not be used as the sole basis for treatment  or other patient management  decisions. A negative result may occur with  improper specimen collection/handling, submission of specimen other than nasopharyngeal swab, presence of viral mutation(s) within the areas targeted by this assay, and inadequate number of viral copies(<138 copies/mL). A negative result must be combined with clinical observations, patient history, and epidemiological information. The expected result is Negative.  Fact Sheet for Patients:  EntrepreneurPulse.com.au  Fact Sheet for Healthcare Providers:  IncredibleEmployment.be  This test is no t yet approved or cleared by the [place] FDA and  has been authorized for detection and/or diagnosis of SARS-CoV-2 by FDA under an Emergency Use Authorization (EUA). This EUA will remain  in effect (meaning this test can be used) for the duration of the COVID-19 declaration under Section 564(b)(1) of the Act, 21 U.S.C.section 360bbb-3(b)(1), unless the authorization is terminated  or revoked sooner.       Influenza A by PCR NEGATIVE NEGATIVE Final   Influenza B by PCR NEGATIVE NEGATIVE Final    Comment: (NOTE) The Xpert Xpress SARS-CoV-2/FLU/RSV plus assay is intended as an aid in the diagnosis of influenza from Nasopharyngeal swab specimens and should not be used as a sole basis for treatment. Nasal washings and aspirates are unacceptable for Xpert Xpress SARS-CoV-2/FLU/RSV testing.  Fact Sheet for Patients: EntrepreneurPulse.com.au  Fact Sheet for Healthcare Providers: IncredibleEmployment.be  This test is not yet approved or cleared by the [place] FDA and has been authorized for detection and/or diagnosis of SARS-CoV-2 by FDA under an Emergency Use Authorization (EUA). This EUA will remain in effect (meaning this test can be used) for the duration of the COVID-19 declaration under Section 564(b)(1) of the Act, 21 U.S.C. section 360bbb-3(b)(1), unless the  authorization is terminated or revoked.  Performed at [facility], [address] [place], [place] [address]     Labs: CBC: Recent Labs  Lab 05/23/22 0601 05/24/22 0544 05/25/22 0345 05/26/22 0747 05/26/22 0942 05/26/22 0944 05/27/22 0404  WBC 5.6 4.8 5.2 4.7  --   --  5.4  HGB 9.1* 9.9* 10.3* 11.2* 9.5* 9.9* 10.5*  HCT 27.1* 29.1* 29.1* 32.7* 28.0* 29.0* 30.3*  MCV 91.2 90.1 89.0 89.3  --   --  89.1  PLT 174 183 202 217  --   --  400   Basic Metabolic Panel: Recent Labs  Lab 05/22/22 2338 05/23/22 0601 05/24/22 0544 05/25/22 0717 05/26/22 0747 05/26/22 0942 05/26/22 0944 05/27/22 0404  NA 139 138 136 136 138 134* 135 135  K 4.5 3.7 3.5 3.5 3.8 3.6 3.6 4.2  CL 105 106 102 101 101  --   --  101  CO2 25 23 27 27 28   --   --  25  GLUCOSE 294* 395* 136* 77 78  --   --  79  BUN 51* 58* 28* 38* 27*  --   --  38*  CREATININE 5.61* 5.68* 4.00* 5.48* 4.65*  --   --  6.11*  CALCIUM 8.8* 8.3* 8.1* 8.9 9.1  --   --  9.1  MG 2.1  --   --   --   --   --   --   --   PHOS  --   --   --  5.6*  --   --   --  6.4*   Liver Function Tests: Recent Labs  Lab 05/25/22 0717 05/27/22 0404  ALBUMIN 3.4* 3.2*   CBG: Recent Labs  Lab 05/26/22 2012 05/27/22 1209 05/27/22 1547 05/27/22 2115 05/28/22  [place]    Discharge time spent: less than 30 minutes.  Signed: Annita Brod, MD [facility] 05/28/2022

## 2022-05-29 ENCOUNTER — Encounter: Payer: Self-pay | Admitting: Cardiovascular Disease

## 2022-06-01 NOTE — H&P (View-Only) (Signed)
Cardiology Clinic Note   Patient Name: Jimmy Olson Date of Encounter: 06/02/2022  Primary Care Provider:  [facility] [place] Primary Cardiologist:  Ida Rogue, MD  Patient Profile    61 year old male with ischemic and nonischemic cardiomyopathy, HFrEF, obstructive coronary disease, end-stage renal disease on hemodialysis, type 2 diabetes, hypertension, hyperlipidemia, TIA, history of DVT, who is being seen today for hospital follow-up.  Past Medical History    Past Medical History:  Diagnosis Date   Anemia    Anxiety and depression    ATN (acute tubular necrosis) (HCC)    CAD (coronary artery disease)    a.) R/LHC 07/19/2020: 20% pRCA, 30% mRCA, 50% dRCA, 50% RPAV, 40% pLAD, 70% D1, 60% mLAD; LVEDP 19 mmHg; PA 37/16 (26 mmHg); PCWP 13 mmHg. b.) LHC 06/03/2021: EF 45-50%; LVEDP 18 mmHg; 40% pLAD, 60% mLAD, 20% pRCA, 30% mRCA, 70% D1, 30% dRCA, 40% RPDA.   Chronic HFrEF (heart failure with reduced ejection fraction) ([place])    a.) 06/2020 Echo: EF 30-35%, glob HK, GrII DD, nl RV fxn. Mild LAE. b.)  TTE 08/08/2021: EF 40-45%; global HK; mild LVH; GLS -8.6; mild LA dilation.   DVT of axillary vein, acute left ([place]) 05/2021   a.) Tx'd with apixaban   ESRD (end stage renal disease) on dialysis [facility])    a.) M-W-F   History of 2019 novel coronavirus disease (COVID-19) 03/06/2020   a.) s/p Tx with monoclonal Ab infusion   HLD (hyperlipidemia)    Hyperkalemia 12/23/2020   Hypertension    Left carotid artery stenosis 08/18/2021   a.) Carotid Doppler 00/86/7619: 50-93% LICA stenosis   Long term current use of anticoagulant    a.) apixaban for DVT   NICM (nonischemic cardiomyopathy) ([place])    a.) 07/2009 MV: EF 48%; b.) 08/2019 Echo: EF 45-50%. Global HK. Mod LVH. GrIDD; c.) 06/2020 Echo: EF 30-35%, glob HK, mild LVH, GrII DD, nl RV fxn, mild LAE; d.) 06/2020 Cath: nonobs dzs. e.)  LHC 06/03/2021; EF 45-50%; LVEDP 18 mmHg; nonobstructive CAD. f.) b.)  TTE 08/08/2021:  EF 40-45%; global HK; mild LVH; GLS -8.6; mild LA dilation.   NSTEMI (non-ST elevated myocardial infarction) ([place]) 07/15/2020   a.) hsTnI trended: 124--> 127--> 176 --> 190 --> 200 ng/L.   Sepsis ([place])    Suicidal ideations    TIA (transient ischemic attack) 06/2021   Type 2 diabetes mellitus treated with insulin  [facility])    Past Surgical History:  Procedure Laterality Date   APPENDECTOMY     AV FISTULA PLACEMENT Left 09/29/2021   Procedure: ARTERIOVENOUS (AV) FISTULA CREATION ( RADIAL CEPHALIC);  Surgeon: Algernon Huxley, MD;  Location: [facility] ORS;  Service: Vascular;  Laterality: Left;   CARDIAC CATHETERIZATION     DIALYSIS/PERMA CATHETER INSERTION N/A 06/02/2021   Procedure: DIALYSIS/PERMA CATHETER INSERTION;  Surgeon: Algernon Huxley, MD;  Location: [place] [facility];  Service: Cardiovascular;  Laterality: N/A;   LEFT HEART CATH AND CORONARY ANGIOGRAPHY N/A 06/03/2021   Procedure: LEFT HEART CATH AND CORONARY ANGIOGRAPHY;  Surgeon: Wellington Hampshire, MD;  Location: [place] [facility];  Service: Cardiovascular;  Laterality: N/A;   LIGATION OF ARTERIOVENOUS  FISTULA Left 10/02/2021   Procedure: LIGATION OF ARTERIOVENOUS  FISTULA;  Surgeon: Evaristo Bury, MD;  Location: [facility] ORS;  Service: Vascular;  Laterality: Left;   NASAL SINUS SURGERY     RIGHT/LEFT HEART CATH AND CORONARY ANGIOGRAPHY N/A 07/19/2020   Procedure: RIGHT/LEFT HEART CATH AND CORONARY  ANGIOGRAPHY;  Surgeon: Wellington Hampshire, MD;  Location: [place] [facility];  Service: Cardiovascular;  Laterality: N/A;   RIGHT/LEFT HEART CATH AND CORONARY ANGIOGRAPHY N/A 05/26/2022   Procedure: RIGHT/LEFT HEART CATH AND CORONARY ANGIOGRAPHY;  Surgeon: Wellington Hampshire, MD;  Location: [place] [facility];  Service: Cardiovascular;  Laterality: N/A;    Allergies  No Known Allergies  History of Present Illness    Jimmy Olson is a 61 year old male with previously mentioned past medical history of insulin-dependent type 2 diabetes,  chronic systolic congestive heart failure with an EF of 45%, end-stage renal disease initiated on dialysis in December 2022, anemia of chronic disease, two-vessel coronary artery disease on left heart catheterization in December 2022, DVT of the left axillary vein in December 2022 on chronic apixaban and recent admission for TIA with MRI showing ulcerated plaque of the left proximal left ICA.  He had an admission for non-STEMI in February 2023 with no intervention performed given prior catheterization in December 2022.  At that time he was positive for COVID-19 infection and was considered for intervention in [place] given the need for atherectomy.  He was readmitted on 09/2021 and underwent ligation of the left radiocephalic AV fistula, carotid Doppler showed no hemodynamically significant stenosis.  Left upper extremity Doppler was negative for DVT.  He was evaluated by neurology and recommended MRI of the brain which was negative for acute stroke.  He continues to have diffuse pain in his left upper extremity which showed diffuse subcutaneous soft tissue swelling from the distal left arm to the forearm with cellulitis versus lymphedema.  He also had steal syndrome as a complication of his dialysis access status post emergent ligation by vascular.  During his hospitalization he had hypertensive emergency with blood pressure of 167/88 was continued on Imdur, hydralazine, labetalol, carvedilol, and losartan.  He was discharged from the hospital on 10/08/2021.  He has had multiple emergency department visits since he was discharged in April.  He presented back to the emergency department with admission on 05/17/2022 with complaints of shortness of breath, chest tightness on exertion he was continued with hemodialysis during his hospitalization with repeat echocardiogram revealing LVEF of 40-45%, G2 DD consistent with prior readings, IV Lasix was discontinued and he was discharged on torsemide 20 mg daily on  05/19/2022.  He presented to the [facility] emergency department on 05/22/2022 with complaints of shortness of breath.  End-stage renal disease on hemodialysis Monday, Wednesday, and Friday with no missed dialysis sessions.  He stated he started having shortness of breath and chest pressure with nonproductive cough that started the day prior.  Chest discomfort he stated felt like a squeezing and he rated at 3 out of 10 in severity with radiation into his left arm.  He denied any other associated symptoms of nausea, vomiting, or diaphoresis.  Initial vital signs revealed a blood pressure of 210/106, pulse of 86, respirations of 24, and a temperature of 98.6.  Pertinent labs showed blood glucose of 294, BUN 51, serum creatinine 5.61, calcium of 8.8, GFR of 11, hemoglobin 9.8, hematocrit 28.4, BNP of three 889.8, high-sensitivity troponin 478 and 462, and respiratory panel was negative.  Imaging chest x-ray revealed progressive interstitial pulmonary edema with mild to moderate in severity.  He was admitted for further evaluation.  And continued on his carvedilol 25 mg twice daily, furosemide was 60 mg IV twice daily continued with heart failure education, had previously been on a Cardene drip that was weaned off due  to hypertensive urgency.  With his complaints of chest pain and shortness of breath on arrival he was pain free at the time of evaluation in the emergency department by our cardiac team.  He was scheduled for right and left heart catheterization on Thursday of that week.  His procedure took place on 05/26/2022 to ensure he was able to have a dialysis after his procedure was completed.  The procedure revealed severely calcified coronary arteries with significant one-vessel coronary artery disease.  Progression of mid LAD stenosis to 80%, moderately reduced LV systolic function with a EF of 35%, right heart catheterization showed low filling pressures, normal pulmonary pressure and mildly reduced cardiac output.   Transient hypotension during the procedure likely due to sedation and volume depletion.  He was given 250 mL normal saline bolus and Levophed was used during the case and weaned off by the end.  His IV furosemide was discontinued, he was to be continued on aggressive medical therapy, and they recommended outpatient LAD atherectomy and stent placement to be done at [facility] within the next 2 weeks.  He stabilized with diuresis and dialysis and was subsequently discharged from facility on 05/28/2022.  He returns to clinic today accompanied by his wife.  We have also used an interpreter today.  Patient states that he has been doing fairly well since being discharged from the hospital.  He denies any current chest pain or shortness of breath.  Has noted labile blood pressures but also states that he has decreased amount of fluid that he has been drinking to prevent fluid overload.  He does have some occasional dizziness and lightheadedness when he is done with dialysis and noted drop in blood pressure.  Home Medications    Current Outpatient Medications  Medication Sig Dispense Refill   acetaminophen (TYLENOL) 325 MG tablet Take 650 mg by mouth every 6 (six) hours as needed.     albuterol (PROVENTIL HFA) 108 (90 Base) MCG/ACT inhaler INHALE 2 PUFFS INTO THE LUNGS ONCE EVERY 6 HOURS AS NEEDED FOR WHEEZING OR SHORTNESS OF BREATH. 6.7 g 10   aspirin EC 81 MG tablet Take 1 tablet (81 mg total) by mouth daily. Swallow whole. 90 tablet 3   carvedilol (COREG) 25 MG tablet Take 1 tablet (25 mg total) by mouth 2 (two) times daily. 180 tablet 3   clopidogrel (PLAVIX) 75 MG tablet Take 1 tablet (75 mg total) by mouth daily. 90 tablet 3   escitalopram (LEXAPRO) 10 MG tablet Take 1 tablet (10 mg total) by mouth at bedtime. 30 tablet 0   hydrALAZINE (APRESOLINE) 100 MG tablet Take 1 tablet (100 mg total) by mouth 2 (two) times daily. 60 tablet 0   insulin detemir (LEVEMIR FLEXPEN) 100 UNIT/ML FlexPen Inject 15 Units  into the skin at bedtime. 15 mL 5   Insulin Pen Needle 32G X 4 MM MISC USE AS DIRECTED 100 each 0   isosorbide mononitrate (IMDUR) 30 MG 24 hr tablet Take 1 tablet (30 mg total) by mouth in the morning and at bedtime. 90 tablet 3   losartan (COZAAR) 100 MG tablet Take 1 tablet (100 mg total) by mouth daily. 30 tablet 0   nitroGLYCERIN (NITROSTAT) 0.4 MG SL tablet Place 1 tablet (0.4 mg total) under the tongue every 5 (five) minutes as needed for chest pain. 25 tablet 0   NOVOLOG FLEXPEN 100 UNIT/ML FlexPen Inject 0-15 Units into the skin 3 (three) times daily with meals. 0-15 units tid 0-5 units qhs  Nutritional Supplements (FEEDING SUPPLEMENT, NEPRO CARB STEADY,) LIQD Take 237 mLs by mouth 3 (three) times daily between meals.  0   pantoprazole (PROTONIX) 20 MG tablet Take 1 tablet (20 mg total) by mouth daily. 30 tablet 0   rosuvastatin (CRESTOR) 40 MG tablet Take 1 tablet (40 mg total) by mouth daily. 30 tablet 0   torsemide (DEMADEX) 20 MG tablet Take 1 tablet (20 mg total) by mouth daily. Takes on Sat and Sun 30 tablet 0   No current facility-administered medications for this visit.     Family History    Family History  Problem Relation Age of Onset   Kidney failure Mother        died @ 26   Heart failure Mother    Other Father        he never knew his father   Diabetes Brother    He indicated that his mother is deceased. He indicated that his father is deceased. He indicated that his brother is alive.  Social History    Social History   Socioeconomic History   Marital status: Married    Spouse name: Ana   Number of children: 7   Years of education: Not on file   Highest education level: Not on file  Occupational History   Not on file  Tobacco Use   Smoking status: Never   Smokeless tobacco: Never  Vaping Use   Vaping Use: Never used  Substance and Sexual Activity   Alcohol use: Not Currently   Drug use: Never   Sexual activity: Not on file  Other Topics  Concern   Not on file  Social History Narrative   Lives locally with wife and son.  He is currently unemployed - has worked in different industries.     Social Determinants of Health   Financial Resource Strain: Not on file  Food Insecurity: No Food Insecurity (05/24/2022)   Hunger Vital Sign    Worried About Running Out of Food in the Last Year: Never true    Ran Out of Food in the Last Year: Never true  Transportation Needs: No Transportation Needs (05/24/2022)   PRAPARE - Hydrologist (Medical): No    Lack of Transportation (Non-Medical): No  Physical Activity: Not on file  Stress: Not on file  Social Connections: Not on file  Intimate Partner Violence: Not At Risk (05/24/2022)   Humiliation, Afraid, Rape, and Kick questionnaire    Fear of Current or Ex-Partner: No    Emotionally Abused: No    Physically Abused: No    Sexually Abused: No     Review of Systems    General:  No chills, fever, night sweats or weight changes.  Endorses exertional fatigue Cardiovascular:  No chest pain, dyspnea on exertion, edema, orthopnea, palpitations, paroxysmal nocturnal dyspnea. Dermatological: No rash, lesions/masses Respiratory: No cough, dyspnea Urologic: No hematuria, dysuria Abdominal:   No nausea, vomiting, diarrhea, bright red blood per rectum, melena, or hematemesis Neurologic:  No visual changes, wkns, changes in mental status.  Slight dizziness after dialysis All other systems reviewed and are otherwise negative except as noted above.    Cardiac Rehabilitation Eligibility Assessment      Physical Exam    VS:  BP (!) 142/74 (BP Location: Left Arm, Patient Position: Sitting, Cuff Size: Normal)   Pulse 76   Ht 5\' 6"  (1.676 m)   Wt 151 lb 12.8 oz (68.9 kg)   SpO2 98%   BMI  24.50 kg/m  , BMI Body mass index is 24.5 kg/m.     Vitals:   06/02/22 0941 06/02/22 0955  BP: (!) 163/75 (!) 142/74    GEN: Well nourished, well developed, in no acute  distress. HEENT: normal. Neck: Supple, no JVD, carotid bruits, or masses. Cardiac: RRR, no murmurs, rubs, or gallops. No clubbing, cyanosis, edema.  Radials 2+/PT 2+ and equal bilaterally.  Respiratory:  Respirations regular and unlabored, clear upper lobes with diminished bases to auscultation bilaterally. GI: Soft, nontender, nondistended, BS + x 4. MS: no deformity or atrophy. Skin: warm and dry, no rash. Right upper chest hemodialysis port. Neuro:  Strength and sensation are intact. Psych: Normal affect.  Accessory Clinical Findings    ECG personally reviewed by me today-sinus rhythm with a rate of 76 with, LVH, early repolarization, ST depression T wave inversions noted in lateral leads- No acute changes  Lab Results  Component Value Date   WBC 5.4 05/27/2022   HGB 10.5 (L) 05/27/2022   HCT 30.3 (L) 05/27/2022   MCV 89.1 05/27/2022   PLT 203 05/27/2022   Lab Results  Component Value Date   CREATININE 6.11 (H) 05/27/2022   BUN 38 (H) 05/27/2022   NA 135 05/27/2022   K 4.2 05/27/2022   CL 101 05/27/2022   CO2 25 05/27/2022   Lab Results  Component Value Date   ALT 18 04/04/2022   AST 28 04/04/2022   ALKPHOS 81 04/04/2022   BILITOT 1.0 04/04/2022   Lab Results  Component Value Date   CHOL 113 10/02/2021   HDL 34 (L) 10/02/2021   LDLCALC 45 10/02/2021   TRIG 170 (H) 10/02/2021   CHOLHDL 3.3 10/02/2021    Lab Results  Component Value Date   HGBA1C 10.1 (H) 05/17/2022    Assessment & Plan   1.  Coronary artery disease with a negative core.  Arteries with recent hospitalization for NSTEMI.  He remains pain-free today but during his hospitalization he underwent right and left heart catheterization on 05/26/2022 which revealed severely calcified coronary arteries with significant one-vessel coronary artery disease with progression of mid LAD stenosis to 80%.  Moderately reduced LV systolic function with EF of 35%.  Recommendation was discontinued on IV furosemide  continue aggressive medical therapy and he has been scheduled for an outpatient LAD atherectomy and stent placement to be done at [facility] on 06/07/2022 by Dr.Arida.  He was started on aspirin 81 mg daily and clopidogrel 75 mg daily after the discontinuation apixaban.  He is also continued on Imdur 30 mg daily and nitroglycerin 0.4 mg sublingual as needed as well as rosuvastatin 40 mg daily.  2. HFrEF with last EF on LHC estimated at 35%, echocardiogram the LVEF 40-45%, BNP 3890 on hospital admission. Previously placed on furosemide 80 mg IVP BID that was discontinued after [facility] as patient was considered dehydrated. Had complaints of shortness of breath but that has resolved on return appointment. He has been limiting his fluid intake to a bottle a day but with complaints dropped blood pressure and dizziness he is encouraged to increase his fluid intake to 2 bottles per day. He is encouraged to continue to do daily weight, limit sodium intake, and not to overindulge on his fluids. He has been continued on coreg 25 mg bid, losartan 100 mg daily, hydralazine 100 mg bid, and torsemide 20 mg on sat and sun  3. Hypertension with blood pressure today of 163/75 and repeat pressure 142/74. He has been continued  on his current medication regimen.   4. Mixed hyperlipidemia with LDL 45. Continued on rosuvastatin 40 mg daily.  5. A history of DVT to the left axillary vein in December 2022.  Last upper extremity duplex of the left arm was done 10/04/2021 which shows no evidence of focal arterial occlusion or stenosis, on 10/03/2021 the venous Doppler showed no evidence of DVT.  Apixaban was discontinued today and he was started on aspirin 81 mg daily and clopidogrel 75 mg daily.  6.  End-stage renal disease continued on hemodialysis on Tuesday, Thursday, Saturday.  Fluid removal is typically managed by dialysis.  He has a right upper chest permacath due to failed AV fistula on the left.  This continues to be managed  by nephrology.  7.  Type 2 diabetes with last hemoglobin A1c of 10.1 on 05/17/2022.  He is maintained on insulin.  This continues to be managed by PCP.  8.  Disposition patient is to return to clinic to see MD/APP 1 to 2 weeks postprocedure or sooner if needed.   Jahquez Steffler, NP 06/02/2022, 11:05 AM  Shared Decision Making/Informed Consent The risks [stroke (1 in 1000), death (1 in 1000), kidney failure [usually temporary] (1 in 500), bleeding (1 in 200), allergic reaction [possibly serious] (1 in 200)], benefits (diagnostic support and management of coronary artery disease) and alternatives of a cardiac catheterization were discussed in detail with Mr. Holtmeyer and he is willing to proceed.

## 2022-06-01 NOTE — Progress Notes (Signed)
Cardiology Clinic Note   Patient Name: Jimmy Olson Date of Encounter: 06/02/2022  Primary Care Provider:  [facility], [place] Primary Cardiologist:  Ida Rogue, MD  Patient Profile    61 year old male with ischemic and nonischemic cardiomyopathy, HFrEF, obstructive coronary disease, end-stage renal disease on hemodialysis, type 2 diabetes, hypertension, hyperlipidemia, TIA, history of DVT, who is being seen today for hospital follow-up.  Past Medical History    Past Medical History:  Diagnosis Date   Anemia    Anxiety and depression    ATN (acute tubular necrosis) (HCC)    CAD (coronary artery disease)    a.) R/LHC 07/19/2020: 20% pRCA, 30% mRCA, 50% dRCA, 50% RPAV, 40% pLAD, 70% D1, 60% mLAD; LVEDP 19 mmHg; PA 37/16 (26 mmHg); PCWP 13 mmHg. b.) LHC 06/03/2021: EF 45-50%; LVEDP 18 mmHg; 40% pLAD, 60% mLAD, 20% pRCA, 30% mRCA, 70% D1, 30% dRCA, 40% RPDA.   Chronic HFrEF (heart failure with reduced ejection fraction) ([place])    a.) 06/2020 Echo: EF 30-35%, glob HK, GrII DD, nl RV fxn. Mild LAE. b.)  TTE 08/08/2021: EF 40-45%; global HK; mild LVH; GLS -8.6; mild LA dilation.   DVT of axillary vein, acute left ([place]) 05/2021   a.) Tx'd with apixaban   ESRD (end stage renal disease) on dialysis [facility])    a.) M-W-F   History of 2019 novel coronavirus disease (COVID-19) 03/06/2020   a.) s/p Tx with monoclonal Ab infusion   HLD (hyperlipidemia)    Hyperkalemia 12/23/2020   Hypertension    Left carotid artery stenosis 08/18/2021   a.) Carotid Doppler 25/95/6387: 56-43% LICA stenosis   Long term current use of anticoagulant    a.) apixaban for DVT   NICM (nonischemic cardiomyopathy) ([place])    a.) 07/2009 MV: EF 48%; b.) 08/2019 Echo: EF 45-50%. Global HK. Mod LVH. GrIDD; c.) 06/2020 Echo: EF 30-35%, glob HK, mild LVH, GrII DD, nl RV fxn, mild LAE; d.) 06/2020 Cath: nonobs dzs. e.)  LHC 06/03/2021; EF 45-50%; LVEDP 18 mmHg; nonobstructive CAD. f.) b.)  TTE 08/08/2021:  EF 40-45%; global HK; mild LVH; GLS -8.6; mild LA dilation.   NSTEMI (non-ST elevated myocardial infarction) ([place]) 07/15/2020   a.) hsTnI trended: 124--> 127--> 176 --> 190 --> 200 ng/L.   Sepsis ([place])    Suicidal ideations    TIA (transient ischemic attack) 06/2021   Type 2 diabetes mellitus treated with insulin [facility])    Past Surgical History:  Procedure Laterality Date   APPENDECTOMY     AV FISTULA PLACEMENT Left 09/29/2021   Procedure: ARTERIOVENOUS (AV) FISTULA CREATION ( RADIAL CEPHALIC);  Surgeon: Algernon Huxley, MD;  Location: [facility] ORS;  Service: Vascular;  Laterality: Left;   CARDIAC CATHETERIZATION     DIALYSIS/PERMA CATHETER INSERTION N/A 06/02/2021   Procedure: DIALYSIS/PERMA CATHETER INSERTION;  Surgeon: Algernon Huxley, MD;  Location: [place] CV LAB;  Service: Cardiovascular;  Laterality: N/A;   LEFT HEART CATH AND CORONARY ANGIOGRAPHY N/A 06/03/2021   Procedure: LEFT HEART CATH AND CORONARY ANGIOGRAPHY;  Surgeon: Wellington Hampshire, MD;  Location: [place] CV LAB;  Service: Cardiovascular;  Laterality: N/A;   LIGATION OF ARTERIOVENOUS  FISTULA Left 10/02/2021   Procedure: LIGATION OF ARTERIOVENOUS  FISTULA;  Surgeon: Evaristo Bury, MD;  Location: [facility] ORS;  Service: Vascular;  Laterality: Left;   NASAL SINUS SURGERY     RIGHT/LEFT HEART CATH AND CORONARY ANGIOGRAPHY N/A 07/19/2020   Procedure: RIGHT/LEFT HEART CATH AND CORONARY  ANGIOGRAPHY;  Surgeon: Wellington Hampshire, MD;  Location: [place] CV LAB;  Service: Cardiovascular;  Laterality: N/A;   RIGHT/LEFT HEART CATH AND CORONARY ANGIOGRAPHY N/A 05/26/2022   Procedure: RIGHT/LEFT HEART CATH AND CORONARY ANGIOGRAPHY;  Surgeon: Wellington Hampshire, MD;  Location: [place] CV LAB;  Service: Cardiovascular;  Laterality: N/A;    Allergies  No Known Allergies  History of Present Illness    Jimmy Olson is a 61 year old male with previously mentioned past medical history of insulin-dependent type 2 diabetes,  chronic systolic congestive heart failure with an EF of 45%, end-stage renal disease initiated on dialysis in December 2022, anemia of chronic disease, two-vessel coronary artery disease on left heart catheterization in December 2022, DVT of the left axillary vein in December 2022 on chronic apixaban and recent admission for TIA with MRI showing ulcerated plaque of the left proximal left ICA.  He had an admission for non-STEMI in February 2023 with no intervention performed given prior catheterization in December 2022.  At that time he was positive for COVID-19 infection and was considered for intervention in [place] given the need for atherectomy.  He was readmitted on 09/2021 and underwent ligation of the left radiocephalic AV fistula, carotid Doppler showed no hemodynamically significant stenosis.  Left upper extremity Doppler was negative for DVT.  He was evaluated by neurology and recommended MRI of the brain which was negative for acute stroke.  He continues to have diffuse pain in his left upper extremity which showed diffuse subcutaneous soft tissue swelling from the distal left arm to the forearm with cellulitis versus lymphedema.  He also had steal syndrome as a complication of his dialysis access status post emergent ligation by vascular.  During his hospitalization he had hypertensive emergency with blood pressure of 167/88 was continued on Imdur, hydralazine, labetalol, carvedilol, and losartan.  He was discharged from the hospital on 10/08/2021.  He has had multiple emergency department visits since he was discharged in April.  He presented back to the emergency department with admission on 05/17/2022 with complaints of shortness of breath, chest tightness on exertion he was continued with hemodialysis during his hospitalization with repeat echocardiogram revealing LVEF of 40-45%, G2 DD consistent with prior readings, IV Lasix was discontinued and he was discharged on torsemide 20 mg daily on  05/19/2022.  He presented to the [facility] emergency department on 05/22/2022 with complaints of shortness of breath.  End-stage renal disease on hemodialysis Monday, Wednesday, and Friday with no missed dialysis sessions.  He stated he started having shortness of breath and chest pressure with nonproductive cough that started the day prior.  Chest discomfort he stated felt like a squeezing and he rated at 3 out of 10 in severity with radiation into his left arm.  He denied any other associated symptoms of nausea, vomiting, or diaphoresis.  Initial vital signs revealed a blood pressure of 210/106, pulse of 86, respirations of 24, and a temperature of 98.6.  Pertinent labs showed blood glucose of 294, BUN 51, serum creatinine 5.61, calcium of 8.8, GFR of 11, hemoglobin 9.8, hematocrit 28.4, BNP of three 889.8, high-sensitivity troponin 478 and 462, and respiratory panel was negative.  Imaging chest x-ray revealed progressive interstitial pulmonary edema with mild to moderate in severity.  He was admitted for further evaluation.  And continued on his carvedilol 25 mg twice daily, furosemide was 60 mg IV twice daily continued with heart failure education, had previously been on a Cardene drip that was weaned off due  to hypertensive urgency.  With his complaints of chest pain and shortness of breath on arrival he was pain free at the time of evaluation in the emergency department by our cardiac team.  He was scheduled for right and left heart catheterization on Thursday of that week.  His procedure took place on 05/26/2022 to ensure he was able to have a dialysis after his procedure was completed.  The procedure revealed severely calcified coronary arteries with significant one-vessel coronary artery disease.  Progression of mid LAD stenosis to 80%, moderately reduced LV systolic function with a EF of 35%, right heart catheterization showed low filling pressures, normal pulmonary pressure and mildly reduced cardiac output.   Transient hypotension during the procedure likely due to sedation and volume depletion.  He was given 250 mL normal saline bolus and Levophed was used during the case and weaned off by the end.  His IV furosemide was discontinued, he was to be continued on aggressive medical therapy, and they recommended outpatient LAD atherectomy and stent placement to be done at [facility] within the next 2 weeks.  He stabilized with diuresis and dialysis and was subsequently discharged from facility on 05/28/2022.  He returns to clinic today accompanied by his wife.  We have also used an interpreter today.  Patient states that he has been doing fairly well since being discharged from the hospital.  He denies any current chest pain or shortness of breath.  Has noted labile blood pressures but also states that he has decreased amount of fluid that he has been drinking to prevent fluid overload.  He does have some occasional dizziness and lightheadedness when he is done with dialysis and noted drop in blood pressure.  Home Medications    Current Outpatient Medications  Medication Sig Dispense Refill   acetaminophen (TYLENOL) 325 MG tablet Take 650 mg by mouth every 6 (six) hours as needed.     albuterol (PROVENTIL HFA) 108 (90 Base) MCG/ACT inhaler INHALE 2 PUFFS INTO THE LUNGS ONCE EVERY 6 HOURS AS NEEDED FOR WHEEZING OR SHORTNESS OF BREATH. 6.7 g 10   aspirin EC 81 MG tablet Take 1 tablet (81 mg total) by mouth daily. Swallow whole. 90 tablet 3   carvedilol (COREG) 25 MG tablet Take 1 tablet (25 mg total) by mouth 2 (two) times daily. 180 tablet 3   clopidogrel (PLAVIX) 75 MG tablet Take 1 tablet (75 mg total) by mouth daily. 90 tablet 3   escitalopram (LEXAPRO) 10 MG tablet Take 1 tablet (10 mg total) by mouth at bedtime. 30 tablet 0   hydrALAZINE (APRESOLINE) 100 MG tablet Take 1 tablet (100 mg total) by mouth 2 (two) times daily. 60 tablet 0   insulin detemir (LEVEMIR FLEXPEN) 100 UNIT/ML FlexPen Inject 15 Units  into the skin at bedtime. 15 mL 5   Insulin Pen Needle 32G X 4 MM MISC USE AS DIRECTED 100 each 0   isosorbide mononitrate (IMDUR) 30 MG 24 hr tablet Take 1 tablet (30 mg total) by mouth in the morning and at bedtime. 90 tablet 3   losartan (COZAAR) 100 MG tablet Take 1 tablet (100 mg total) by mouth daily. 30 tablet 0   nitroGLYCERIN (NITROSTAT) 0.4 MG SL tablet Place 1 tablet (0.4 mg total) under the tongue every 5 (five) minutes as needed for chest pain. 25 tablet 0   NOVOLOG FLEXPEN 100 UNIT/ML FlexPen Inject 0-15 Units into the skin 3 (three) times daily with meals. 0-15 units tid 0-5 units qhs  Nutritional Supplements (FEEDING SUPPLEMENT, NEPRO CARB STEADY,) LIQD Take 237 mLs by mouth 3 (three) times daily between meals.  0   pantoprazole (PROTONIX) 20 MG tablet Take 1 tablet (20 mg total) by mouth daily. 30 tablet 0   rosuvastatin (CRESTOR) 40 MG tablet Take 1 tablet (40 mg total) by mouth daily. 30 tablet 0   torsemide (DEMADEX) 20 MG tablet Take 1 tablet (20 mg total) by mouth daily. Takes on Sat and Sun 30 tablet 0   No current facility-administered medications for this visit.     Family History    Family History  Problem Relation Age of Onset   Kidney failure Mother        died @ 60   Heart failure Mother    Other Father        he never knew his father   Diabetes Brother    He indicated that his mother is deceased. He indicated that his father is deceased. He indicated that his brother is alive.  Social History    Social History   Socioeconomic History   Marital status: Married    Spouse name: Ana   Number of children: 7   Years of education: Not on file   Highest education level: Not on file  Occupational History   Not on file  Tobacco Use   Smoking status: Never   Smokeless tobacco: Never  Vaping Use   Vaping Use: Never used  Substance and Sexual Activity   Alcohol use: Not Currently   Drug use: Never   Sexual activity: Not on file  Other Topics  Concern   Not on file  Social History Narrative   Lives locally with wife and son.  He is currently unemployed - has worked in different industries.     Social Determinants of Health   Financial Resource Strain: Not on file  Food Insecurity: No Food Insecurity (05/24/2022)   Hunger Vital Sign    Worried About Running Out of Food in the Last Year: Never true    Ran Out of Food in the Last Year: Never true  Transportation Needs: No Transportation Needs (05/24/2022)   PRAPARE - Hydrologist (Medical): No    Lack of Transportation (Non-Medical): No  Physical Activity: Not on file  Stress: Not on file  Social Connections: Not on file  Intimate Partner Violence: Not At Risk (05/24/2022)   Humiliation, Afraid, Rape, and Kick questionnaire    Fear of Current or Ex-Partner: No    Emotionally Abused: No    Physically Abused: No    Sexually Abused: No     Review of Systems    General:  No chills, fever, night sweats or weight changes.  Endorses exertional fatigue Cardiovascular:  No chest pain, dyspnea on exertion, edema, orthopnea, palpitations, paroxysmal nocturnal dyspnea. Dermatological: No rash, lesions/masses Respiratory: No cough, dyspnea Urologic: No hematuria, dysuria Abdominal:   No nausea, vomiting, diarrhea, bright red blood per rectum, melena, or hematemesis Neurologic:  No visual changes, wkns, changes in mental status.  Slight dizziness after dialysis All other systems reviewed and are otherwise negative except as noted above.    Cardiac Rehabilitation Eligibility Assessment      Physical Exam    VS:  BP (!) 142/74 (BP Location: Left Arm, Patient Position: Sitting, Cuff Size: Normal)   Pulse 76   Ht 5\' 6"  (1.676 m)   Wt 151 lb 12.8 oz (68.9 kg)   SpO2 98%   BMI  24.50 kg/m  , BMI Body mass index is 24.5 kg/m.     Vitals:   06/02/22 0941 06/02/22 0955  BP: (!) 163/75 (!) 142/74    GEN: Well nourished, well developed, in no acute  distress. HEENT: normal. Neck: Supple, no JVD, carotid bruits, or masses. Cardiac: RRR, no murmurs, rubs, or gallops. No clubbing, cyanosis, edema.  Radials 2+/PT 2+ and equal bilaterally.  Respiratory:  Respirations regular and unlabored, clear upper lobes with diminished bases to auscultation bilaterally. GI: Soft, nontender, nondistended, BS + x 4. MS: no deformity or atrophy. Skin: warm and dry, no rash. Right upper chest hemodialysis port. Neuro:  Strength and sensation are intact. Psych: Normal affect.  Accessory Clinical Findings    ECG personally reviewed by me today-sinus rhythm with a rate of 76 with, LVH, early repolarization, ST depression T wave inversions noted in lateral leads- No acute changes  Lab Results  Component Value Date   WBC 5.4 05/27/2022   HGB 10.5 (L) 05/27/2022   HCT 30.3 (L) 05/27/2022   MCV 89.1 05/27/2022   PLT 203 05/27/2022   Lab Results  Component Value Date   CREATININE 6.11 (H) 05/27/2022   BUN 38 (H) 05/27/2022   NA 135 05/27/2022   K 4.2 05/27/2022   CL 101 05/27/2022   CO2 25 05/27/2022   Lab Results  Component Value Date   ALT 18 04/04/2022   AST 28 04/04/2022   ALKPHOS 81 04/04/2022   BILITOT 1.0 04/04/2022   Lab Results  Component Value Date   CHOL 113 10/02/2021   HDL 34 (L) 10/02/2021   LDLCALC 45 10/02/2021   TRIG 170 (H) 10/02/2021   CHOLHDL 3.3 10/02/2021    Lab Results  Component Value Date   HGBA1C 10.1 (H) 05/17/2022    Assessment & Plan   1.  Coronary artery disease with a negative core.  Arteries with recent hospitalization for NSTEMI.  He remains pain-free today but during his hospitalization he underwent right and left heart catheterization on 05/26/2022 which revealed severely calcified coronary arteries with significant one-vessel coronary artery disease with progression of mid LAD stenosis to 80%.  Moderately reduced LV systolic function with EF of 35%.  Recommendation was discontinued on IV furosemide  continue aggressive medical therapy and he has been scheduled for an outpatient LAD atherectomy and stent placement to be done at [facility] on 06/07/2022 by Dr.Arida.  He was started on aspirin 81 mg daily and clopidogrel 75 mg daily after the discontinuation apixaban.  He is also continued on Imdur 30 mg daily and nitroglycerin 0.4 mg sublingual as needed as well as rosuvastatin 40 mg daily.  2. HFrEF with last EF on LHC estimated at 35%, echocardiogram the LVEF 40-45%, BNP 3890 on hospital admission. Previously placed on furosemide 80 mg IVP BID that was discontinued after [facility] as patient was considered dehydrated. Had complaints of shortness of breath but that has resolved on return appointment. He has been limiting his fluid intake to a bottle a day but with complaints dropped blood pressure and dizziness he is encouraged to increase his fluid intake to 2 bottles per day. He is encouraged to continue to do daily weight, limit sodium intake, and not to overindulge on his fluids. He has been continued on coreg 25 mg bid, losartan 100 mg daily, hydralazine 100 mg bid, and torsemide 20 mg on sat and sun  3. Hypertension with blood pressure today of 163/75 and repeat pressure 142/74. He has been continued  on his current medication regimen.   4. Mixed hyperlipidemia with LDL 45. Continued on rosuvastatin 40 mg daily.  5. A history of DVT to the left axillary vein in December 2022.  Last upper extremity duplex of the left arm was done 10/04/2021 which shows no evidence of focal arterial occlusion or stenosis, on 10/03/2021 the venous Doppler showed no evidence of DVT.  Apixaban was discontinued today and he was started on aspirin 81 mg daily and clopidogrel 75 mg daily.  6.  End-stage renal disease continued on hemodialysis on Tuesday, Thursday, Saturday.  Fluid removal is typically managed by dialysis.  He has a right upper chest permacath due to failed AV fistula on the left.  This continues to be managed  by nephrology.  7.  Type 2 diabetes with last hemoglobin A1c of 10.1 on 05/17/2022.  He is maintained on insulin.  This continues to be managed by PCP.  8.  Disposition patient is to return to clinic to see MD/APP 1 to 2 weeks postprocedure or sooner if needed.   Perris Conwell, NP 06/02/2022, 11:05 AM  Shared Decision Making/Informed Consent The risks [stroke (1 in 1000), death (1 in 1000), kidney failure [usually temporary] (1 in 500), bleeding (1 in 200), allergic reaction [possibly serious] (1 in 200)], benefits (diagnostic support and management of coronary artery disease) and alternatives of a cardiac catheterization were discussed in detail with Mr. Peets and he is willing to proceed.

## 2022-06-02 ENCOUNTER — Other Ambulatory Visit: Payer: Self-pay

## 2022-06-02 ENCOUNTER — Other Ambulatory Visit
Admission: RE | Admit: 2022-06-02 | Discharge: 2022-06-02 | Disposition: A | Discharge status: Home / Self Care | Payer: Medicare Other | Attending: Cardiology | Admitting: Cardiology

## 2022-06-02 ENCOUNTER — Ambulatory Visit: Payer: Medicare Other | Attending: Cardiology | Admitting: Cardiology

## 2022-06-02 ENCOUNTER — Encounter: Payer: Self-pay | Admitting: Cardiology

## 2022-06-02 VITALS — BP 142/74 | HR 76 | Ht 66.0 in | Wt 151.8 lb

## 2022-06-02 DIAGNOSIS — I25118 Atherosclerotic heart disease of native coronary artery with other forms of angina pectoris: Secondary | ICD-10-CM | POA: Diagnosis not present

## 2022-06-02 DIAGNOSIS — I5022 Chronic systolic (congestive) heart failure: Secondary | ICD-10-CM | POA: Diagnosis not present

## 2022-06-02 DIAGNOSIS — I10 Essential (primary) hypertension: Secondary | ICD-10-CM | POA: Insufficient documentation

## 2022-06-02 DIAGNOSIS — Z794 Long term (current) use of insulin: Secondary | ICD-10-CM | POA: Insufficient documentation

## 2022-06-02 DIAGNOSIS — E782 Mixed hyperlipidemia: Secondary | ICD-10-CM | POA: Insufficient documentation

## 2022-06-02 DIAGNOSIS — Z86718 Personal history of other venous thrombosis and embolism: Secondary | ICD-10-CM | POA: Diagnosis present

## 2022-06-02 DIAGNOSIS — I214 Non-ST elevation (NSTEMI) myocardial infarction: Secondary | ICD-10-CM | POA: Insufficient documentation

## 2022-06-02 DIAGNOSIS — N186 End stage renal disease: Secondary | ICD-10-CM | POA: Diagnosis present

## 2022-06-02 DIAGNOSIS — Z992 Dependence on renal dialysis: Secondary | ICD-10-CM | POA: Diagnosis present

## 2022-06-02 DIAGNOSIS — E1122 Type 2 diabetes mellitus with diabetic chronic kidney disease: Secondary | ICD-10-CM | POA: Diagnosis not present

## 2022-06-02 LAB — BASIC METABOLIC PANEL
Anion gap: 8 (ref 5–15)
BUN: 34 mg/dL — ABNORMAL HIGH (ref 6–20)
CO2: 26 mmol/L (ref 22–32)
Calcium: 8.9 mg/dL (ref 8.9–10.3)
Chloride: 109 mmol/L (ref 98–111)
Creatinine, Ser: 4.96 mg/dL — ABNORMAL HIGH (ref 0.61–1.24)
GFR, Estimated: 13 mL/min — ABNORMAL LOW (ref >60)
Glucose, Bld: 132 mg/dL — ABNORMAL HIGH (ref 70–99)
Potassium: 4.1 mmol/L (ref 3.5–5.1)
Sodium: 143 mmol/L (ref 135–145)

## 2022-06-02 LAB — CBC
HCT: 28.5 % — ABNORMAL LOW (ref 39.0–52.0)
Hemoglobin: 9.8 g/dL — ABNORMAL LOW (ref 13.0–17.0)
MCH: 31.1 pg (ref 26.0–34.0)
MCHC: 34.4 g/dL (ref 30.0–36.0)
MCV: 90.5 fL (ref 80.0–100.0)
Platelets: 222 10*3/uL (ref 150–400)
RBC: 3.15 MIL/uL — ABNORMAL LOW (ref 4.22–5.81)
RDW: 12.2 % (ref 11.5–15.5)
WBC: 5.8 10*3/uL (ref 4.0–10.5)
nRBC: 0 % (ref 0.0–0.2)

## 2022-06-02 MED ORDER — CLOPIDOGREL BISULFATE 75 MG PO TABS
75.0000 mg | ORAL_TABLET | Freq: Every day | ORAL | 3 refills | Status: DC
  Filled 2022-06-02: qty 90, 90d supply, fill #0

## 2022-06-02 MED ORDER — ASPIRIN 81 MG PO TBEC: 81.0000 mg | DELAYED_RELEASE_TABLET | Freq: Every day | ORAL | 3 refills | Status: DC

## 2022-06-02 MED ORDER — SODIUM CHLORIDE 0.9% FLUSH: 3.0000 mL | Freq: Two times a day (BID) | INTRAVENOUS | Status: DC

## 2022-06-02 NOTE — Patient Instructions (Signed)
Medication Instructions:  STOP the Eliquis  START Aspirin 81 mg once daily START Plavix 75 mg once daily  *If you need a refill on your cardiac medications before your next appointment, please call your pharmacy* Labs: Your provider would like for you to have following labs drawn: BMET and CBC.   Please go to the [facility] entrance and check in at the front desk.  You do not need an appointment.  They are open from 7am-6 pm.   Follow-Up: At [facility], you and your health needs are our priority.  As part of our continuing mission to provide you with exceptional heart care, we have created designated Provider Care Teams.  These Care Teams include your primary Cardiologist (physician) and Advanced Practice Providers (APPs -  Physician Assistants and Nurse Practitioners) who all work together to provide you with the care you need, when you need it.  We recommend signing up for the patient portal called "MyChart".  Sign up information is provided on this After Visit Summary.  MyChart is used to connect with patients for Virtual Visits (Telemedicine).  Patients are able to view lab/test results, encounter notes, upcoming appointments, etc.  Non-urgent messages can be sent to your provider as well.   To learn more about what you can do with MyChart, go to [email or web address].    Your next appointment:   3 week(s)  The format for your next appointment:   In Person  Provider:   Gerrie Nordmann, NP    Other Instructions       Cardiac/Peripheral Catheterization   You are scheduled for a Cardiac Catheterization on Wednesday, December 20 with Dr. [name].  1. Please arrive at the Main Entrance A at [facility]: [place],  [address] on December 20 at 10:30 AM (This time is two hours before your procedure to ensure your preparation). Free valet parking service is available. You will check in at ADMITTING. The support person will be  asked to wait in the waiting room.  It is OK to have someone drop you off and come back when you are ready to be discharged.        Special note: Every effort is made to have your procedure done on time. Please understand that emergencies sometimes delay scheduled procedures.   . 2. Diet: Do not eat solid foods after midnight.  You may have clear liquids until 5 AM the day of the procedure.  3. Labs: You will need to have blood drawn on 06/02/22. You do not need to be fasting.  4. Medication instructions in preparation for your procedure: Hold all diabetic medication the morning of the procedure Take half the dose of the insulin Levemir the night before the procedure Hold the Torsemide the morning of the procedure   On the morning of your procedure, take Aspirin 81 mg and Plavix/Clopidogrel and any morning medicines NOT listed above.  You may use sips of water.  5. Plan to go home the same day, you will only stay overnight if medically necessary. 6. You MUST have a responsible adult to drive you home. 7. An adult MUST be with you the first 24 hours after you arrive home. 8. Bring a current list of your medications, and the last time and date medication taken. 9. Bring ID and current insurance cards. 10.Please wear clothes that are easy to get on and off and wear slip-on shoes.  Thank you for allowing [place] to care for  you!   -- [facility]

## 2022-06-02 NOTE — Progress Notes (Signed)
Pre-procedure labs with stable findings

## 2022-06-06 ENCOUNTER — Telehealth: Payer: Self-pay | Admitting: *Deleted

## 2022-06-06 NOTE — Telephone Encounter (Addendum)
Coronary Atherectomy scheduled at [facility] for: Wednesday June 07, 2022 12:30 PM Arrival time and place: [place] Entrance A at: 10:30 AM  Nothing to eat after midnight prior to procedure, clear liquids until 5 AM day of procedure.  Medication instructions: -Hold:  Insulin-AM of procedure/1/2 usual Insulin HS prior to procedure  Torsemide-AM of procedure  -Except hold medications usual morning medications can be taken with sips of water including aspirin 81 mg and Plavix 75 mg.   Confirmed patient has responsible adult to drive home post procedure and be with patient first 24 hours after arriving home.  Patient reports no new symptoms concerning for COVID-19 in the past 10 days.  Tu-Th-Sat dialysis confirmed with patient's wife.  Assistance of [place] [id number] reviewed procedure instructions with patient's wife (DPR), [place].

## 2022-06-07 ENCOUNTER — Ambulatory Visit (HOSPITAL_COMMUNITY)
Admission: RE | Disposition: A | Discharge status: Home / Self Care | Payer: Self-pay | Source: Home / Self Care | Attending: Cardiovascular Disease

## 2022-06-07 ENCOUNTER — Ambulatory Visit (HOSPITAL_COMMUNITY)
Admission: RE | Admit: 2022-06-07 | Discharge: 2022-06-08 | Disposition: A | Discharge status: Home / Self Care | Payer: Medicare Other | Attending: Cardiovascular Disease | Admitting: Cardiovascular Disease

## 2022-06-07 DIAGNOSIS — Z794 Long term (current) use of insulin: Secondary | ICD-10-CM | POA: Diagnosis not present

## 2022-06-07 DIAGNOSIS — I2510 Atherosclerotic heart disease of native coronary artery without angina pectoris: Secondary | ICD-10-CM | POA: Insufficient documentation

## 2022-06-07 DIAGNOSIS — I214 Non-ST elevation (NSTEMI) myocardial infarction: Secondary | ICD-10-CM | POA: Diagnosis present

## 2022-06-07 DIAGNOSIS — Z7902 Long term (current) use of antithrombotics/antiplatelets: Secondary | ICD-10-CM | POA: Insufficient documentation

## 2022-06-07 DIAGNOSIS — I25118 Atherosclerotic heart disease of native coronary artery with other forms of angina pectoris: Secondary | ICD-10-CM

## 2022-06-07 DIAGNOSIS — E782 Mixed hyperlipidemia: Secondary | ICD-10-CM | POA: Diagnosis not present

## 2022-06-07 DIAGNOSIS — Z992 Dependence on renal dialysis: Secondary | ICD-10-CM | POA: Diagnosis not present

## 2022-06-07 DIAGNOSIS — Z8673 Personal history of transient ischemic attack (TIA), and cerebral infarction without residual deficits: Secondary | ICD-10-CM | POA: Diagnosis not present

## 2022-06-07 DIAGNOSIS — Z86718 Personal history of other venous thrombosis and embolism: Secondary | ICD-10-CM | POA: Insufficient documentation

## 2022-06-07 DIAGNOSIS — N186 End stage renal disease: Secondary | ICD-10-CM | POA: Insufficient documentation

## 2022-06-07 DIAGNOSIS — I132 Hypertensive heart and chronic kidney disease with heart failure and with stage 5 chronic kidney disease, or end stage renal disease: Secondary | ICD-10-CM | POA: Diagnosis not present

## 2022-06-07 DIAGNOSIS — I428 Other cardiomyopathies: Secondary | ICD-10-CM | POA: Insufficient documentation

## 2022-06-07 DIAGNOSIS — E1122 Type 2 diabetes mellitus with diabetic chronic kidney disease: Secondary | ICD-10-CM | POA: Insufficient documentation

## 2022-06-07 DIAGNOSIS — Z79899 Other long term (current) drug therapy: Secondary | ICD-10-CM | POA: Diagnosis not present

## 2022-06-07 DIAGNOSIS — I5022 Chronic systolic (congestive) heart failure: Secondary | ICD-10-CM | POA: Diagnosis not present

## 2022-06-07 DIAGNOSIS — Z7982 Long term (current) use of aspirin: Secondary | ICD-10-CM | POA: Insufficient documentation

## 2022-06-07 DIAGNOSIS — E785 Hyperlipidemia, unspecified: Secondary | ICD-10-CM | POA: Diagnosis present

## 2022-06-07 DIAGNOSIS — Z955 Presence of coronary angioplasty implant and graft: Secondary | ICD-10-CM | POA: Insufficient documentation

## 2022-06-07 DIAGNOSIS — E1169 Type 2 diabetes mellitus with other specified complication: Secondary | ICD-10-CM

## 2022-06-07 HISTORY — PX: CORONARY STENT INTERVENTION: CATH118234

## 2022-06-07 HISTORY — PX: INTRAVASCULAR IMAGING/OCT: CATH118326

## 2022-06-07 HISTORY — PX: CORONARY ATHERECTOMY: CATH118238

## 2022-06-07 LAB — GLUCOSE, CAPILLARY
Glucose-Capillary: 134 mg/dL — ABNORMAL HIGH (ref 70–99)
Glucose-Capillary: 64 mg/dL — ABNORMAL LOW (ref 70–99)
Glucose-Capillary: 96 mg/dL (ref 70–99)
Glucose-Capillary: 238 mg/dL — ABNORMAL HIGH (ref 70–99)

## 2022-06-07 LAB — POCT ACTIVATED CLOTTING TIME
Activated Clotting Time: 255 seconds
Activated Clotting Time: 304 seconds

## 2022-06-07 SURGERY — CORONARY ATHERECTOMY
Anesthesia: LOCAL

## 2022-06-07 MED ORDER — LIDOCAINE HCL (PF) 1 % IJ SOLN
INTRAMUSCULAR | Status: AC
  Filled 2022-06-07: qty 30

## 2022-06-07 MED ORDER — ONDANSETRON HCL 4 MG/2ML IJ SOLN: 4.0000 mg | Freq: Four times a day (QID) | INTRAMUSCULAR | Status: DC | PRN

## 2022-06-07 MED ORDER — MIDAZOLAM HCL 2 MG/2ML IJ SOLN
INTRAMUSCULAR | Status: DC | PRN
  Administered 2022-06-07: 1 mg via INTRAVENOUS

## 2022-06-07 MED ORDER — ESCITALOPRAM OXALATE 10 MG PO TABS
10.0000 mg | ORAL_TABLET | Freq: Every day | ORAL | Status: DC
  Administered 2022-06-07: 10 mg via ORAL
  Filled 2022-06-07: qty 1

## 2022-06-07 MED ORDER — INSULIN ASPART 100 UNIT/ML IJ SOLN: 0.0000 [IU] | Freq: Three times a day (TID) | INTRAMUSCULAR | Status: DC

## 2022-06-07 MED ORDER — CHLORHEXIDINE GLUCONATE CLOTH 2 % EX PADS: 6.0000 | MEDICATED_PAD | Freq: Every day | CUTANEOUS | Status: DC

## 2022-06-07 MED ORDER — NEPRO/CARBSTEADY PO LIQD: 237.0000 mL | Freq: Three times a day (TID) | ORAL | Status: DC

## 2022-06-07 MED ORDER — HEPARIN (PORCINE) IN NACL 1000-0.9 UT/500ML-% IV SOLN
INTRAVENOUS | Status: DC | PRN
  Administered 2022-06-07 (×2): 500 mL

## 2022-06-07 MED ORDER — FENTANYL CITRATE (PF) 100 MCG/2ML IJ SOLN
INTRAMUSCULAR | Status: AC
  Filled 2022-06-07: qty 2

## 2022-06-07 MED ORDER — CARVEDILOL 25 MG PO TABS
25.0000 mg | ORAL_TABLET | Freq: Two times a day (BID) | ORAL | Status: DC
  Administered 2022-06-07 – 2022-06-08 (×2): 25 mg via ORAL
  Filled 2022-06-07 (×2): qty 1

## 2022-06-07 MED ORDER — PANTOPRAZOLE SODIUM 20 MG PO TBEC
20.0000 mg | DELAYED_RELEASE_TABLET | Freq: Every day | ORAL | Status: DC
  Administered 2022-06-07 – 2022-06-08 (×2): 20 mg via ORAL
  Filled 2022-06-07 (×2): qty 1

## 2022-06-07 MED ORDER — VERAPAMIL HCL 2.5 MG/ML IV SOLN
INTRAVENOUS | Status: AC
  Filled 2022-06-07: qty 2

## 2022-06-07 MED ORDER — DEXTROSE 50 % IV SOLN: 25.0000 mL | Freq: Once | INTRAVENOUS | Status: AC

## 2022-06-07 MED ORDER — NITROGLYCERIN 1 MG/10 ML FOR IR/CATH LAB
INTRA_ARTERIAL | Status: AC
  Filled 2022-06-07: qty 10

## 2022-06-07 MED ORDER — LIDOCAINE HCL (PF) 1 % IJ SOLN
INTRAMUSCULAR | Status: DC | PRN
  Administered 2022-06-07: 10 mL

## 2022-06-07 MED ORDER — LOSARTAN POTASSIUM 50 MG PO TABS
100.0000 mg | ORAL_TABLET | Freq: Every day | ORAL | Status: DC
  Administered 2022-06-08: 100 mg via ORAL
  Filled 2022-06-07: qty 2

## 2022-06-07 MED ORDER — HEPARIN SODIUM (PORCINE) 1000 UNIT/ML IJ SOLN
INTRAMUSCULAR | Status: DC | PRN
  Administered 2022-06-07: 2000 [IU] via INTRAVENOUS
  Administered 2022-06-07: 7000 [IU] via INTRAVENOUS

## 2022-06-07 MED ORDER — LABETALOL HCL 5 MG/ML IV SOLN: 10.0000 mg | INTRAVENOUS | Status: AC | PRN

## 2022-06-07 MED ORDER — LABETALOL HCL 5 MG/ML IV SOLN
INTRAVENOUS | Status: DC | PRN
  Administered 2022-06-07: 10 mg via INTRAVENOUS

## 2022-06-07 MED ORDER — HYDRALAZINE HCL 50 MG PO TABS
100.0000 mg | ORAL_TABLET | Freq: Three times a day (TID) | ORAL | Status: DC
  Administered 2022-06-07 – 2022-06-08 (×2): 100 mg via ORAL
  Filled 2022-06-07 (×2): qty 2

## 2022-06-07 MED ORDER — ASPIRIN 81 MG PO TBEC
81.0000 mg | DELAYED_RELEASE_TABLET | Freq: Every day | ORAL | Status: DC
  Administered 2022-06-08: 81 mg via ORAL
  Filled 2022-06-07: qty 1

## 2022-06-07 MED ORDER — IOHEXOL 350 MG/ML SOLN
INTRAVENOUS | Status: DC | PRN
  Administered 2022-06-07: 140 mL via INTRA_ARTERIAL

## 2022-06-07 MED ORDER — INSULIN ASPART 100 UNIT/ML FLEXPEN: 0.0000 [IU] | PEN_INJECTOR | Freq: Three times a day (TID) | SUBCUTANEOUS | Status: DC

## 2022-06-07 MED ORDER — INSULIN DETEMIR 100 UNIT/ML ~~LOC~~ SOLN
15.0000 [IU] | Freq: Every day | SUBCUTANEOUS | Status: DC
  Administered 2022-06-07: 15 [IU] via SUBCUTANEOUS
  Filled 2022-06-07 (×3): qty 0.15

## 2022-06-07 MED ORDER — LABETALOL HCL 5 MG/ML IV SOLN
INTRAVENOUS | Status: AC
  Filled 2022-06-07: qty 4

## 2022-06-07 MED ORDER — CLOPIDOGREL BISULFATE 300 MG PO TABS
ORAL_TABLET | ORAL | Status: DC | PRN
  Administered 2022-06-07: 300 mg via ORAL

## 2022-06-07 MED ORDER — SODIUM CHLORIDE 0.9 % IV SOLN: INTRAVENOUS | Status: DC

## 2022-06-07 MED ORDER — SODIUM CHLORIDE 0.9% FLUSH: 3.0000 mL | INTRAVENOUS | Status: DC | PRN

## 2022-06-07 MED ORDER — HEPARIN (PORCINE) IN NACL 1000-0.9 UT/500ML-% IV SOLN
INTRAVENOUS | Status: AC
  Filled 2022-06-07: qty 1000

## 2022-06-07 MED ORDER — ACETAMINOPHEN 325 MG PO TABS
650.0000 mg | ORAL_TABLET | ORAL | Status: DC | PRN
  Administered 2022-06-07: 650 mg via ORAL
  Filled 2022-06-07: qty 2

## 2022-06-07 MED ORDER — DEXTROSE 50 % IV SOLN
INTRAVENOUS | Status: AC
  Administered 2022-06-07: 50 mL
  Filled 2022-06-07: qty 50

## 2022-06-07 MED ORDER — CLOPIDOGREL BISULFATE 300 MG PO TABS
ORAL_TABLET | ORAL | Status: AC
  Filled 2022-06-07: qty 1

## 2022-06-07 MED ORDER — CLOPIDOGREL BISULFATE 75 MG PO TABS
75.0000 mg | ORAL_TABLET | Freq: Every day | ORAL | Status: DC
  Administered 2022-06-08: 75 mg via ORAL
  Filled 2022-06-07: qty 1

## 2022-06-07 MED ORDER — FENTANYL CITRATE (PF) 100 MCG/2ML IJ SOLN
INTRAMUSCULAR | Status: DC | PRN
  Administered 2022-06-07: 25 ug via INTRAVENOUS

## 2022-06-07 MED ORDER — HEPARIN SODIUM (PORCINE) 1000 UNIT/ML IJ SOLN
INTRAMUSCULAR | Status: AC
  Filled 2022-06-07: qty 10

## 2022-06-07 MED ORDER — SODIUM CHLORIDE 0.9 % IV SOLN: 250.0000 mL | INTRAVENOUS | Status: DC | PRN

## 2022-06-07 MED ORDER — NITROGLYCERIN 0.4 MG SL SUBL: 0.4000 mg | SUBLINGUAL_TABLET | SUBLINGUAL | Status: DC | PRN

## 2022-06-07 MED ORDER — SODIUM CHLORIDE 0.9% FLUSH
3.0000 mL | Freq: Two times a day (BID) | INTRAVENOUS | Status: DC
  Administered 2022-06-07: 3 mL via INTRAVENOUS

## 2022-06-07 MED ORDER — ISOSORBIDE MONONITRATE ER 60 MG PO TB24
60.0000 mg | ORAL_TABLET | Freq: Every day | ORAL | Status: DC
  Administered 2022-06-08: 60 mg via ORAL
  Filled 2022-06-07: qty 1

## 2022-06-07 MED ORDER — HYDRALAZINE HCL 20 MG/ML IJ SOLN: 10.0000 mg | INTRAMUSCULAR | Status: AC | PRN

## 2022-06-07 MED ORDER — ASPIRIN 81 MG PO CHEW: 81.0000 mg | CHEWABLE_TABLET | ORAL | Status: DC

## 2022-06-07 MED ORDER — ROSUVASTATIN CALCIUM 20 MG PO TABS
40.0000 mg | ORAL_TABLET | Freq: Every day | ORAL | Status: DC
  Administered 2022-06-08: 40 mg via ORAL
  Filled 2022-06-07: qty 2

## 2022-06-07 MED ORDER — MIDAZOLAM HCL 2 MG/2ML IJ SOLN
INTRAMUSCULAR | Status: AC
  Filled 2022-06-07: qty 2

## 2022-06-07 MED ORDER — TORSEMIDE 20 MG PO TABS: 20.0000 mg | ORAL_TABLET | ORAL | Status: DC

## 2022-06-07 SURGICAL SUPPLY — 25 items
BALL SAPPHIRE NC24 3.0X26 (BALLOONS) ×1
BALLN SAPPHIRE 2.5X20 (BALLOONS) ×1
BALLN SCOREFLEX 2.75X15 (BALLOONS) ×1
BALLOON SAPPHIRE 2.5X20 (BALLOONS) IMPLANT
BALLOON SAPPHIRE NC24 3.0X26 (BALLOONS) IMPLANT
BALLOON SCOREFLEX 2.75X15 (BALLOONS) IMPLANT
CATH DRAGONFLY OPSTAR (CATHETERS) IMPLANT
CATH VISTA GUIDE 6FR XBLAD4 (CATHETERS) IMPLANT
CROWN DIAMONDBACK CLASSIC 1.25 (BURR) IMPLANT
DEVICE CLOSURE MYNXGRIP 6/7F (Vascular Products) IMPLANT
ELECT DEFIB PAD ADLT CADENCE (PAD) IMPLANT
KIT ENCORE 26 ADVANTAGE (KITS) IMPLANT
KIT HEART LEFT (KITS) ×1 IMPLANT
KIT MICROPUNCTURE NIT STIFF (SHEATH) IMPLANT
LUBRICANT VIPERSLIDE CORONARY (MISCELLANEOUS) IMPLANT
PACK CARDIAC CATHETERIZATION (CUSTOM PROCEDURE TRAY) ×1 IMPLANT
SHEATH PINNACLE 6F 10CM (SHEATH) IMPLANT
SHEATH PROBE COVER 6X72 (BAG) IMPLANT
STENT SYNERGY XD 2.75X48 (Permanent Stent) IMPLANT
SYNERGY XD 2.75X48 (Permanent Stent) ×1 IMPLANT
TRANSDUCER W/STOPCOCK (MISCELLANEOUS) ×1 IMPLANT
TUBING CIL FLEX 10 FLL-RA (TUBING) ×1 IMPLANT
WIRE EMERALD 3MM-J .035X150CM (WIRE) IMPLANT
WIRE RUNTHROUGH IZANAI 014 180 (WIRE) IMPLANT
WIRE VIPERWIRE COR FLEX .012 (WIRE) IMPLANT

## 2022-06-07 NOTE — Interval H&P Note (Signed)
History and Physical Interval Note:  06/07/2022 2:56 PM  Jimmy Olson  has presented today for surgery, with the diagnosis of chest pain.  The various methods of treatment have been discussed with the patient and family. After consideration of risks, benefits and other options for treatment, the patient has consented to  Procedure(s): CORONARY ATHERECTOMY (N/A) as a surgical intervention.  The patient's history has been reviewed, patient examined, no change in status, stable for surgery.  I have reviewed the patient's chart and labs.  Questions were answered to the patient's satisfaction.     [name]

## 2022-06-07 NOTE — Progress Notes (Signed)
CRITICAL RESULT PROVIDER NOTIFICATION  Test performed and critical result: CBG: 64  Date and time result received: 06/07/22 at 1051  Provider name/title: Fletcher Anon, MD   Date and time provider notified: Hypoglycemia Protocol Followed  Date and time provider responded: N/A  Provider response: Hypoglycemia protocol followed. 1/2 AMP of D50 given.  CBG re-checked after approximately 30 minutes (D/T starting IV) and is 134. Will continue to monitor.   Patient denied any symptoms of hypoglycemia.

## 2022-06-07 NOTE — Progress Notes (Signed)
Patient is scheduled for dialysis tomorrow at 11:00.  Attempted to call his center [facility] to get dialysis moved to later in afternoon.  No answer they close at 4:30.  They reopen at 0545, will attempt to call in the morning.

## 2022-06-08 ENCOUNTER — Encounter (HOSPITAL_COMMUNITY): Payer: Self-pay | Admitting: Cardiovascular Disease

## 2022-06-08 DIAGNOSIS — E1122 Type 2 diabetes mellitus with diabetic chronic kidney disease: Secondary | ICD-10-CM | POA: Diagnosis not present

## 2022-06-08 DIAGNOSIS — I214 Non-ST elevation (NSTEMI) myocardial infarction: Secondary | ICD-10-CM

## 2022-06-08 DIAGNOSIS — E782 Mixed hyperlipidemia: Secondary | ICD-10-CM | POA: Diagnosis not present

## 2022-06-08 DIAGNOSIS — Z992 Dependence on renal dialysis: Secondary | ICD-10-CM | POA: Diagnosis not present

## 2022-06-08 LAB — GLUCOSE, CAPILLARY
Glucose-Capillary: 59 mg/dL — ABNORMAL LOW (ref 70–99)
Glucose-Capillary: 245 mg/dL — ABNORMAL HIGH (ref 70–99)

## 2022-06-08 LAB — BASIC METABOLIC PANEL
Anion gap: 8 (ref 5–15)
BUN: 46 mg/dL — ABNORMAL HIGH (ref 6–20)
CO2: 24 mmol/L (ref 22–32)
Calcium: 8.8 mg/dL — ABNORMAL LOW (ref 8.9–10.3)
Chloride: 102 mmol/L (ref 98–111)
Creatinine, Ser: 5.72 mg/dL — ABNORMAL HIGH (ref 0.61–1.24)
GFR, Estimated: 11 mL/min — ABNORMAL LOW (ref >60)
Glucose, Bld: 143 mg/dL — ABNORMAL HIGH (ref 70–99)
Potassium: 3.7 mmol/L (ref 3.5–5.1)
Sodium: 134 mmol/L — ABNORMAL LOW (ref 135–145)

## 2022-06-08 LAB — CBC
HCT: 24.9 % — ABNORMAL LOW (ref 39.0–52.0)
Hemoglobin: 8.8 g/dL — ABNORMAL LOW (ref 13.0–17.0)
MCH: 31.7 pg (ref 26.0–34.0)
MCHC: 35.3 g/dL (ref 30.0–36.0)
MCV: 89.6 fL (ref 80.0–100.0)
Platelets: 182 10*3/uL (ref 150–400)
RBC: 2.78 MIL/uL — ABNORMAL LOW (ref 4.22–5.81)
RDW: 12.5 % (ref 11.5–15.5)
WBC: 5.1 10*3/uL (ref 4.0–10.5)
nRBC: 0 % (ref 0.0–0.2)

## 2022-06-08 MED FILL — Nitroglycerin IV Soln 100 MCG/ML in D5W: INTRA_ARTERIAL | Qty: 10 | Status: AC

## 2022-06-08 NOTE — Progress Notes (Signed)
Heart Failure Navigator Progress Note  Assessed for Heart & Vascular TOC clinic readiness.  Patient does not meet criteria due to ESRD on hemodialysis.   Navigator will sign off at this time.    Tanijah Morais, BSN, RN Heart Failure Nurse Navigator Secure Chat Only   

## 2022-06-08 NOTE — Inpatient Diabetes Management (Signed)
Inpatient Diabetes Program Recommendations  AACE/ADA: New Consensus Statement on Inpatient Glycemic Control (2015)  Target Ranges:  Prepandial:   less than 140 mg/dL      Peak postprandial:   less than 180 mg/dL (1-2 hours)      Critically ill patients:  140 - 180 mg/dL   Lab Results  Component Value Date   GLUCAP 245 (H) 06/08/2022   HGBA1C 10.1 (H) 05/17/2022    Review of Glycemic Control  Latest Reference Range & Units 06/07/22 21:19 06/08/22 08:05 06/08/22 09:30  Glucose-Capillary 70 - 99 mg/dL 238 (H) 59 (L) 245 (H)  (H): Data is abnormally high (L): Data is abnormally low Diabetes history: Type 2 DM Outpatient Diabetes medications: Levemir 15 units QHS, Novolog 0-15 units TID Current orders for Inpatient glycemic control: Levemir 15 units QHS, Novolog 0-15 units TID  Inpatient Diabetes Program Recommendations:    Noted hypoglycemia this AM of 59 mg/dL. Of note, patient had recent reduction of home dose of Levemir.   Consider further reduction of Levemir to 12 units QHS and changing correction scale to Novolog 0-6 units TID & HS.   Thanks, Bronson Curb, MSN, RNC-OB Diabetes Coordinator [phone] (8a-5p)

## 2022-06-08 NOTE — Progress Notes (Signed)
Hypoglycemic Event  CBG: 59  Treatment: 4 oz juice/soda and patient eating breakfast tray  Symptoms: None  Follow-up CBG: Time: 0929  CBG Result: 245  Possible Reasons for Event: Had not yet had breakfast    Manuella Ghazi

## 2022-06-08 NOTE — Discharge Summary (Addendum)
Discharge Summary    Patient ID: Jimmy Olson MRN: [id number]; DOB: [birth year]  Admit date: 06/07/2022 Discharge date: 06/08/2022  PCP:  [facility] Cardiologist:  Ida Rogue, MD      Discharge Diagnoses    Principal Problem:   NSTEMI (non-ST elevated myocardial infarction) [facility]) Active Problems:   Type 2 diabetes mellitus with hyperlipidemia ([place])   HLD (hyperlipidemia)   ESRD on hemodialysis [facility])   Diagnostic Studies/Procedures    Cath: 06/07/2022    Prox LAD lesion is 40% stenosed.   1st Diag lesion is 70% stenosed.   Ost LAD to Prox LAD lesion is 50% stenosed.   Mid LAD lesion is 80% stenosed.   A drug-eluting stent was successfully placed using a SYNERGY XD [id number].   Post intervention, there is a 0% residual stenosis.   Successful OCT guided orbital atherectomy and drug-eluting stent placement to the mid LAD.   Recommendations: Dual platelet therapy for at least 12 months. Aggressive treatment of risk factors. The patient does have moderate ostial LAD stenosis.  If this worsens in the future, the location is not optimal PCI and might be best served with CABG.  Diagnostic Dominance: Right  Intervention   _____________   History of Present Illness     Jimmy Olson is a 61 y.o. male with ischemic and nonischemic cardiomyopathy, HFrEF, obstructive coronary disease, end-stage renal disease on hemodialysis, type 2 diabetes, hypertension, hyperlipidemia, TIA, history of DVT, who was seen in the office on 12/15 for hospital follow-up.  He had an admission for non-STEMI in February 2023 with no intervention performed given prior catheterization in December 2022.  At that time he was positive for COVID-19 infection and was considered for intervention in [place] given the need for atherectomy.  He was readmitted on 09/2021 and underwent ligation of the left radiocephalic AV fistula, carotid Doppler  showed no hemodynamically significant stenosis.  Left upper extremity Doppler was negative for DVT.  He was evaluated by neurology and recommended MRI of the brain which was negative for acute stroke.  He continues to have diffuse pain in his left upper extremity which showed diffuse subcutaneous soft tissue swelling from the distal left arm to the forearm with cellulitis versus lymphedema.  He also had steal syndrome as a complication of his dialysis access status post emergent ligation by vascular.  During his hospitalization he had hypertensive emergency with blood pressure of 167/88 was continued on Imdur, hydralazine, labetalol, carvedilol, and losartan.  He was discharged from the hospital on 10/08/2021.  He has had multiple emergency department visits since he was discharged in April.  He presented back to the emergency department with admission on 05/17/2022 with complaints of shortness of breath, chest tightness on exertion he was continued with hemodialysis during his hospitalization with repeat echocardiogram revealing LVEF of 40-45%, G2 DD consistent with prior readings, IV Lasix was discontinued and he was discharged on torsemide 20 mg daily on 05/19/2022.   He presented to the [facility] emergency department on 05/22/2022 with complaints of shortness of breath.  End-stage renal disease on hemodialysis Monday, Wednesday, and Friday with no missed dialysis sessions.  He stated he started having shortness of breath and chest pressure with nonproductive cough that started the day prior.  Chest discomfort he stated felt like a squeezing and he rated at 3 out of 10 in severity with radiation into his left arm.  He denied any other associated symptoms of  nausea, vomiting, or diaphoresis.  Initial vital signs revealed a blood pressure of 210/106, pulse of 86, respirations of 24, and a temperature of 98.6.  Pertinent labs showed blood glucose of 294, BUN 51, serum creatinine 5.61, calcium of 8.8, GFR of 11, hemoglobin  9.8, hematocrit 28.4, BNP of three 889.8, high-sensitivity troponin 478 and 462, and respiratory panel was negative.  Imaging chest x-ray revealed progressive interstitial pulmonary edema with mild to moderate in severity.  He was admitted for further evaluation.  And continued on his carvedilol 25 mg twice daily, furosemide was 60 mg IV twice daily continued with heart failure education, had previously been on a Cardene drip that was weaned off due to hypertensive urgency.  With his complaints of chest pain and shortness of breath on arrival he was pain free at the time of evaluation in the emergency department by our cardiac team.  He was scheduled for right and left heart catheterization on Thursday of that week.  His procedure took place on 05/26/2022 to ensure he was able to have a dialysis after his procedure was completed.  The procedure revealed severely calcified coronary arteries with significant one-vessel coronary artery disease.  Progression of mid LAD stenosis to 80%, moderately reduced LV systolic function with a EF of 35%, right heart catheterization showed low filling pressures, normal pulmonary pressure and mildly reduced cardiac output.  Transient hypotension during the procedure likely due to sedation and volume depletion.  He was given 250 mL normal saline bolus and Levophed was used during the case and weaned off by the end.  His IV furosemide was discontinued, he was to be continued on aggressive medical therapy, and they recommended outpatient LAD atherectomy and stent placement to be done at [facility] within the next 2 weeks.  He stabilized with diuresis and dialysis and was subsequently discharged from facility on 05/28/2022.   Hospital Course     CAD -- Presented 12/20 for outpatient cardiac catheterization and underwent successful OTC guided orbital atherectomy and DES x 1 to mid LAD.  Recommendations for DAPT with aspirin/Plavix for at least 1 year.  Does have moderate ostial LAD  stenosis, notes indicate this worsens in the future location would not be optimal PCI of a be best served with CABG.  No recurrent chest discomfort post cath.  Seen by cardiac rehab. -- continue ASA, plavix, statin, BB  End-stage renal disease -- Monday Wednesday Friday hemo-dialysis in [place].   -- he will proceed to outpatient HD at discharge    Ischemic cardiomyopathy -- Ejection fraction 40 to 45% -- GDMT: on coreg, losartan (per renal)  Hyperlipidemia - LDL 45.  Excellent.  Crestor 40 mg high intensity dose   Diabetes  -- Hemoglobin A1c 10.1 -- resume home insulin dosing st discharge  -- follow up with PCP   History of DVT left axillary vein in December 2022 --  Apixaban has been discontinued.  Currently on aspirin and clopidogrel 75.   Anemia of chronic kidney disease -- Hemoglobin 8.8 -- No signs of bleeding post catheterization.  Patient was seen by Dr. Marlou Porch and deemed stable for discharge home. Follow up appt arranged in the office.   Did the patient have an acute coronary syndrome (MI, NSTEMI, STEMI, etc) this admission?:  No                               Did the patient have a percutaneous coronary intervention (stent /  angioplasty)?:  Yes.     Cath/PCI Registry Performance & Quality Measures: Aspirin prescribed? - Yes ADP Receptor Inhibitor (Plavix/Clopidogrel, Brilinta/Ticagrelor or Effient/Prasugrel) prescribed (includes medically managed patients)? - Yes High Intensity Statin (Lipitor 40-48m or Crestor 20-453m prescribed? - Yes For EF <40%, was ACEI/ARB prescribed? - Not Applicable (EF >/= 4001%For EF <40%, Aldosterone Antagonist (Spironolactone or Eplerenone) prescribed? - Not Applicable (EF >/= 4075%Cardiac Rehab Phase II ordered? - Yes     The patient will be scheduled for a TOC follow up appointment in 10-14 days.  A message has been sent to the [facility] Scheduling Pool at the office where the patient should be seen for follow up.   _____________  Discharge Vitals Blood pressure (!) 156/72, pulse 80, temperature 98.2 F (36.8 C), temperature source Oral, resp. rate 18, height 5' 6" (1.676 m), weight 68.9 kg, SpO2 99 %.  Filed Weights   06/07/22 1044  Weight: 68.9 kg    Labs & Radiologic Studies    CBC Recent Labs    06/08/22 0223  WBC 5.1  HGB 8.8*  HCT 24.9*  MCV 89.6  PLT 18102 Basic Metabolic Panel Recent Labs    06/08/22 0223  NA 134*  K 3.7  CL 102  CO2 24  GLUCOSE 143*  BUN 46*  CREATININE 5.72*  CALCIUM 8.8*   Liver Function Tests No results for input(s): "AST", "ALT", "ALKPHOS", "BILITOT", "PROT", "ALBUMIN" in the last 72 hours. No results for input(s): "LIPASE", "AMYLASE" in the last 72 hours. High Sensitivity Troponin:   Recent Labs  Lab 05/18/22 0850 05/19/22 0218 05/19/22 0407 05/22/22 2338 05/23/22 0209  TROPONINIHS 194* 202* 197* 478* 462*    BNP Invalid input(s): "POCBNP" D-Dimer No results for input(s): "DDIMER" in the last 72 hours. Hemoglobin A1C No results for input(s): "HGBA1C" in the last 72 hours. Fasting Lipid Panel No results for input(s): "CHOL", "HDL", "LDLCALC", "TRIG", "CHOLHDL", "LDLDIRECT" in the last 72 hours. Thyroid Function Tests No results for input(s): "TSH", "T4TOTAL", "T3FREE", "THYROIDAB" in the last 72 hours.  Invalid input(s): "FREET3" _____________  CARDIAC CATHETERIZATION  Result Date: 06/07/2022   Prox LAD lesion is 40% stenosed.   1st Diag lesion is 70% stenosed.   Ost LAD to Prox LAD lesion is 50% stenosed.   Mid LAD lesion is 80% stenosed.   A drug-eluting stent was successfully placed using a SYNERGY [id number] [id number]  Post intervention, there is a 0% residual stenosis. Successful OCT guided orbital atherectomy and drug-eluting stent placement to the mid LAD. Recommendations: Dual platelet therapy for at least 12 months. Aggressive treatment of risk factors. The patient does have moderate ostial LAD stenosis.  If this worsens in the  future, the location is not optimal PCI and might be best served with CABG.   CARDIAC CATHETERIZATION  Result Date: 05/26/2022   Prox LAD lesion is 40% stenosed.   Prox RCA lesion is 20% stenosed.   Mid RCA lesion is 30% stenosed.   Dist RCA lesion is 30% stenosed.   1st Diag lesion is 70% stenosed.   RPDA lesion is 40% stenosed.   Mid LAD lesion is 80% stenosed.   Ost LAD to Prox LAD lesion is 40% stenosed.   There is moderate left ventricular systolic dysfunction.   LV end diastolic pressure is normal. 1.  Severely calcified coronary arteries with significant one-vessel coronary artery disease.  Progression of mid LAD stenosis to 80%. 2.  Moderately reduced LV systolic function with an  EF of 35%. 3.  Right heart catheterization showed low filling pressures, normal pulmonary pressure and mildly reduced cardiac output.  RA: 3 mmHg, RV: 19/2 mmHg, PW: 5 mmHg, PA: 16/6 with a mean of 10 mmHg, cardiac output is 4.7 with an index of 2.5. 4.  Transient hypotension during the procedure likely due to sedation and volume depletion.  The patient was given 250 mL of normal saline bolus and Levophed was used during the case and weaned off at the end. Recommendations: The patient is volume depleted.  I discontinued IV furosemide. Continue aggressive medical therapy. Recommend outpatient LAD atherectomy and stent placement.  This can be done by me at [facility] in the next 2 weeks.  DG Chest Port 1 View  Result Date: 05/23/2022 CLINICAL DATA:  Dyspnea EXAM: PORTABLE CHEST 1 VIEW COMPARISON:  05/17/2022 FINDINGS: The lungs are symmetrically well expanded. No pneumothorax or pleural effusion. Mild cardiomegaly is stable. Diffuse interstitial pulmonary edema has progressed slightly in the interval and is now mild-to-moderate in severity. Right internal jugular hemodialysis catheter tip again noted at the superior cavoatrial junction. No acute bone abnormality. IMPRESSION: 1. Progressive interstitial pulmonary edema, now  mild-to-moderate in severity. Electronically Signed   By: Fidela Salisbury M.D.   On: 05/23/2022 00:10   ECHOCARDIOGRAM COMPLETE  Result Date: 05/18/2022    ECHOCARDIOGRAM REPORT   Patient Name:   Jimmy Olson Date of Exam: 05/18/2022 Medical Rec #:  [id number]     Height:       66.0 in Accession #:    [id number]    Weight:       147.3 lb Date of Birth:  11-Mar-1961     BSA:          1.756 m Patient Age:    42 years      BP:           181/99 mmHg Patient Gender: M             HR:           75 bpm. Exam Location:  [facility] Procedure: 2D Echo, Color Doppler, Cardiac Doppler and Intracardiac            Opacification Agent Indications:     I50.31 congestive heart failure-Acute Diastolic  History:         Patient has prior history of Echocardiogram examinations, most                  recent 08/08/2021. HFrEF, NICM, CAD, ESRD and TIA; Risk                  Factors:Hypertension, Diabetes and Dyslipidemia.  Sonographer:     Charmayne Sheer Referring Phys:  [id number] Athena Masse Diagnosing Phys: Ida Rogue MD  Sonographer Comments: Suboptimal apical window. IMPRESSIONS  1. Left ventricular ejection fraction, by estimation, is 40 to 45%. The left ventricle has mildly decreased function. The left ventricle demonstrates global hypokinesis. There is mild left ventricular hypertrophy. Small region of noncompaction noted in the apical region, less likely mural thrombus (noted on prior studies). Left ventricular diastolic parameters are consistent with Grade II diastolic dysfunction (pseudonormalization).  2. Right ventricular systolic function is normal. The right ventricular size is normal. Tricuspid regurgitation signal is inadequate for assessing PA pressure.  3. The mitral valve is normal in structure. No evidence of mitral valve regurgitation. No evidence of mitral stenosis.  4. The aortic valve was not well visualized. Aortic valve regurgitation is not visualized.  No aortic stenosis is present.  5. The inferior vena cava is  normal in size with greater than 50% respiratory variability, suggesting right atrial pressure of 3 mmHg. FINDINGS  Left Ventricle: Left ventricular ejection fraction, by estimation, is 40 to 45%. The left ventricle has mildly decreased function. The left ventricle demonstrates global hypokinesis. Definity contrast agent was given IV to delineate the left ventricular  endocardial borders. The left ventricular internal cavity size was normal in size. There is mild left ventricular hypertrophy. Left ventricular diastolic parameters are consistent with Grade II diastolic dysfunction (pseudonormalization). Right Ventricle: The right ventricular size is normal. No increase in right ventricular wall thickness. Right ventricular systolic function is normal. Tricuspid regurgitation signal is inadequate for assessing PA pressure. Left Atrium: Left atrial size was normal in size. Right Atrium: Right atrial size was normal in size. Pericardium: There is no evidence of pericardial effusion. Mitral Valve: The mitral valve is normal in structure. No evidence of mitral valve regurgitation. No evidence of mitral valve stenosis. Tricuspid Valve: The tricuspid valve is normal in structure. Tricuspid valve regurgitation is not demonstrated. No evidence of tricuspid stenosis. Aortic Valve: The aortic valve was not well visualized. Aortic valve regurgitation is not visualized. No aortic stenosis is present. Aortic valve mean gradient measures 2.0 mmHg. Aortic valve peak gradient measures 3.7 mmHg. Aortic valve area, by VTI measures 2.22 cm. Pulmonic Valve: The pulmonic valve was normal in structure. Pulmonic valve regurgitation is not visualized. No evidence of pulmonic stenosis. Aorta: The aortic root is normal in size and structure. Venous: The inferior vena cava is normal in size with greater than 50% respiratory variability, suggesting right atrial pressure of 3 mmHg. IAS/Shunts: No atrial level shunt detected by color flow  Doppler.  LEFT VENTRICLE PLAX 2D LVIDd:         4.50 cm      Diastology LVIDs:         3.70 cm      LV e' medial:    5.55 cm/s LV PW:         1.40 cm      LV E/e' medial:  11.0 LV IVS:        1.40 cm      LV e' lateral:   6.31 cm/s LVOT diam:     1.90 cm      LV E/e' lateral: 9.6 LV SV:         33 LV SV Index:   19 LVOT Area:     2.84 cm  LV Volumes (MOD) LV vol d, MOD A2C: 101.0 ml LV vol d, MOD A4C: 117.0 ml LV vol s, MOD A2C: 51.5 ml LV vol s, MOD A4C: 66.0 ml LV SV MOD A2C:     49.5 ml LV SV MOD A4C:     117.0 ml LV SV MOD BP:      52.3 ml RIGHT VENTRICLE RV Basal diam:  2.70 cm TAPSE (M-mode): 1.8 cm LEFT ATRIUM             Index        RIGHT ATRIUM          Index LA diam:        3.90 cm 2.22 cm/m   RA Area:     7.95 cm LA Vol (A2C):   31.4 ml 17.88 ml/m  RA Volume:   13.20 ml 7.52 ml/m LA Vol (A4C):   25.0 ml 14.24 ml/m LA Biplane Vol: 28.0 ml 15.95 ml/m  AORTIC VALVE                    PULMONIC VALVE AV Area (Vmax):    1.97 cm     PV Vmax:       0.80 m/s AV Area (Vmean):   2.29 cm     PV Vmean:      55.100 cm/s AV Area (VTI):     2.22 cm     PV VTI:        0.138 m AV Vmax:           96.80 cm/s   PV Peak grad:  2.6 mmHg AV Vmean:          62.500 cm/s  PV Mean grad:  1.0 mmHg AV VTI:            0.147 m AV Peak Grad:      3.7 mmHg AV Mean Grad:      2.0 mmHg LVOT Vmax:         67.10 cm/s LVOT Vmean:        50.400 cm/s LVOT VTI:          0.115 m LVOT/AV VTI ratio: 0.78  AORTA Ao Root diam: 2.60 cm MITRAL VALVE MV Area (PHT): 3.72 cm    SHUNTS MV Decel Time: 204 msec    Systemic VTI:  0.12 m MV E velocity: 60.80 cm/s  Systemic Diam: 1.90 cm MV A velocity: 57.10 cm/s MV E/A ratio:  1.06 Ida Rogue MD Electronically signed by Ida Rogue MD Signature Date/Time: 05/18/2022/5:44:03 PM    Final    DG Chest 1 View  Result Date: 05/17/2022 CLINICAL DATA:  Cough and shortness of breath EXAM: CHEST  1 VIEW COMPARISON:  Radiographs 04/04/2022 FINDINGS: Stable cardiomegaly. Aortic atherosclerotic  calcification. Right IJ CVC tip in the right atrium. Mild bilateral interstitial opacities. No focal consolidation, pleural effusion, or pneumothorax. IMPRESSION: Cardiomegaly.  Mild interstitial edema. Electronically Signed   By: Placido Sou M.D.   On: 05/17/2022 20:37   Disposition   Pt is being discharged home today in good condition.  Follow-up Plans & Appointments     Follow-up Information     Gerrie Nordmann, NP Follow up on 06/23/2022.   Specialty: Cardiology Why: at 9:15am for your follow up appt Contact information: [address], [place] [address] [phone]                Discharge Instructions     AMB Referral to Cardiac Rehabilitation - Phase II   Complete by: As directed    Diagnosis:  NSTEMI Coronary Stents     After initial evaluation and assessments completed: Virtual Based Care may be provided alone or in conjunction with Phase 2 Cardiac Rehab based on patient barriers.: Yes   Intensive Cardiac Rehabilitation (ICR) [place] location only OR Traditional Cardiac Rehabilitation (TCR) *If criteria for ICR are not met will enroll in TCR [facility] only): Yes   Call MD for:  difficulty breathing, headache or visual disturbances   Complete by: As directed    Call MD for:  redness, tenderness, or signs of infection (pain, swelling, redness, odor or green/yellow discharge around incision site)   Complete by: As directed    Diet - low sodium heart healthy   Complete by: As directed    Discharge instructions   Complete by: As directed    Groin Site Care Refer to this sheet in the next few weeks. These instructions provide  you with information on caring for yourself after your procedure. Your caregiver may also give you more specific instructions. Your treatment has been planned according to current medical practices, but problems sometimes occur. Call your caregiver if you have any problems or questions after your procedure. HOME CARE  INSTRUCTIONS You may shower 24 hours after the procedure. Remove the bandage (dressing) and gently wash the site with plain soap and water. Gently pat the site dry.  Do not apply powder or lotion to the site.  Do not sit in a bathtub, swimming pool, or whirlpool for 5 to 7 days.  No bending, squatting, or lifting anything over 10 pounds (4.5 kg) as directed by your caregiver.  Inspect the site at least twice daily.  Do not drive home if you are discharged the same day of the procedure. Have someone else drive you.  You may drive 24 hours after the procedure unless otherwise instructed by your caregiver.  What to expect: Any bruising will usually fade within 1 to 2 weeks.  Blood that collects in the tissue (hematoma) may be painful to the touch. It should usually decrease in size and tenderness within 1 to 2 weeks.  SEEK IMMEDIATE MEDICAL CARE IF: You have unusual pain at the groin site or down the affected leg.  You have redness, warmth, swelling, or pain at the groin site.  You have drainage (other than a small amount of blood on the dressing).  You have chills.  You have a fever or persistent symptoms for more than 72 hours.  You have a fever and your symptoms suddenly get worse.  Your leg becomes pale, cool, tingly, or numb.  You have heavy bleeding from the site. Hold pressure on the site. Marland Kitchen  PLEASE DO NOT MISS ANY DOSES OF YOUR PLAVIX!!!!! Also keep a log of you blood pressures and bring back to your follow up appt. Please call the office with any questions.   Patients taking blood thinners should generally stay away from medicines like ibuprofen, Advil, Motrin, naproxen, and Aleve due to risk of stomach bleeding. You may take Tylenol as directed or talk to your primary doctor about alternatives.  Some studies suggest Prilosec/Omeprazole interacts with Plavix. We changed your Prilosec/Omeprazole to the equivalent dose of Protonix for less chance of interaction.  PLEASE ENSURE THAT  YOU DO NOT RUN OUT OF YOUR PLAVIX. This medication is very important to remain on for at least one year. IF you have issues obtaining this medication due to cost please CALL the office 3-5 business days prior to running out in order to prevent missing doses of this medication.   Increase activity slowly   Complete by: As directed         Discharge Medications   Allergies as of 06/08/2022   No Known Allergies      Medication List     TAKE these medications    acetaminophen 325 MG tablet Commonly known as: TYLENOL Take 650 mg by mouth every 6 (six) hours as needed.   albuterol 108 (90 Base) MCG/ACT inhaler Commonly known as: Proventil HFA INHALE 2 PULSACIONES EN LOS PULMONES UNA VEZ CADA [place]. (INHALE 2 PUFFS INTO THE LUNGS ONCE EVERY 6 HOURS AS NEEDED FOR WHEEZING OR SHORTNESS OF BREATH.)   aspirin EC 81 MG tablet Take 1 tablet (81 mg total) by mouth daily. Swallow whole.   carvedilol 25 MG tablet Commonly known as: [place] 1  tableta (25 mg en total) por va oral 2 (dos) veces al [employer]. (Take 1 tablet (25 mg total) by mouth 2 (two) times daily.)   clopidogrel 75 MG tablet Commonly known as: PLAVIX Tome 1 tableta (75 mg en total) por va oral diariamente. (Take 1 tablet (75 mg total) by mouth daily.)   Comfort EZ Pen Needles 32G X 4 MM Misc Generic drug: Insulin Pen Needle Use como se indica. (USE AS DIRECTED)   escitalopram 10 MG tablet Commonly known as: LEXAPRO Tome 1 tableta (10 mg en total) por va oral antes de acostarse. (Take 1 tablet (10 mg total) by mouth at bedtime.)   feeding supplement (NEPRO CARB STEADY) Liqd Take 237 mLs by mouth 3 (three) times daily between meals.   hydrALAZINE 100 MG tablet Commonly known as: APRESOLINE Take 1 tablet (100 mg total) by mouth 2 (two) times daily.   isosorbide mononitrate 30 MG 24 hr tablet Commonly known as: IMDUR Tome 1 tableta (30 mg en total) por va  oral por la maana y al acostarse. (Take 1 tablet (30 mg total) by mouth in the morning and at bedtime.)   Levemir FlexPen 100 UNIT/ML FlexPen Generic drug: insulin detemir Inject 15 Units into the skin at bedtime.   losartan 100 MG tablet Commonly known as: COZAAR Tome 1 tableta (100 mg en total) por va oral diariamente. (Take 1 tablet (100 mg total) by mouth daily.)   nitroGLYCERIN 0.4 MG SL tablet Commonly known as: NITROSTAT Place 1 tablet (0.4 mg total) under the tongue every 5 (five) minutes as needed for chest pain.   NovoLOG FlexPen 100 UNIT/ML FlexPen Generic drug: insulin aspart Inject 0-15 Units into the skin 3 (three) times daily with meals. 0-15 units tid 0-5 units qhs   pantoprazole 20 MG tablet Commonly known as: Protonix Tome 1 tableta (20 mg en total) por va oral diariamente. (Take 1 tablet (20 mg total) by mouth daily.)   rosuvastatin 40 MG tablet Commonly known as: CRESTOR Tome 1 tableta (40 mg en total) por va oral diariamente. (Take 1 tablet (40 mg total) by mouth daily.)   torsemide 20 MG tablet Commonly known as: DEMADEX Take 1 tablet (20 mg total) by mouth daily. Takes on Sat and Sun         Outstanding Labs/Studies   N/a   Duration of Discharge Encounter   Greater than 30 minutes including physician time.  Signed, Reino Bellis, NP 06/08/2022, 8:05 AM  Personally seen and examined. Agree with above.  [facility] Cardiologist: Ida Rogue, MD    Subjective   Feels well, ready to go home.  Hemodialysis today.   Inpatient Medications   Scheduled Meds:  aspirin EC  81 mg Oral Daily  carvedilol  25 mg Oral BID  Chlorhexidine Gluconate Cloth  6 each Topical Daily  clopidogrel  75 mg Oral Daily  escitalopram  10 mg Oral QHS  feeding supplement (NEPRO CARB STEADY)  237 mL Oral TID BM  hydrALAZINE  100 mg Oral Q8H  insulin aspart  0-15 Units Subcutaneous TID WC  insulin detemir  15  Units Subcutaneous QHS  isosorbide mononitrate  60 mg Oral Daily  losartan  100 mg Oral Daily  pantoprazole  20 mg Oral Daily  rosuvastatin  40 mg Oral Daily  sodium chloride flush  3 mL Intravenous Q12H  [START ON 06/10/2022] torsemide  20 mg Oral Once per day on Sun Sat   Continuous Infusions:  sodium chloride  PRN Meds: sodium chloride, acetaminophen, nitroGLYCERIN, ondansetron (ZOFRAN) IV, sodium chloride flush    Vital Signs   Vitals:   06/07/22 1616 06/07/22 2037 06/08/22 0034 06/08/22 0615 BP: (!) 164/84 120/87 (!) 144/66 (!) 156/72 Pulse: 70 82 76 80 Resp: _0 Temp:   98.1 F (36.7 C)   98.2 F (36.8 C) TempSrc:   Oral   Oral SpO2: 98% 100% 98% 99% Weight:         Height:             Intake/Output Summary (Last 24 hours) at 06/08/2022 0744 Last data filed at 06/08/2022 0618 Gross per 24 hour Intake -- Output 350 ml Net -350 ml       06/07/2022   10:44 AM 06/02/2022    9:41 AM 05/28/2022    5:00 AM Last 3 Weights Weight (lbs) 151 lb 12.8 oz 151 lb 12.8 oz 143 lb 1.3 oz Weight (kg) 68.856 kg 68.856 kg 64.9 kg      Telemetry   Sinus rhythm, occasional PVCs- Personally Reviewed   ECG   Prior EKG shows sinus rhythm 76 LVH early repolarization ST depression in the lateral leads.- Personally Reviewed   Physical Exam   GEN: No acute distress.   Neck: No JVD Cardiac: RRR, no murmurs, rubs, or gallops.  Cardiac catheterization site clean dry and intact Respiratory: Clear to auscultation bilaterally. GI: Soft, nontender, non-distended  MS: No edema; No deformity. Neuro:  Nonfocal  Psych: Normal affect    Labs   High Sensitivity Troponin:   Last Labs Recent Labs Lab 05/18/22 0850 05/19/22 0218 05/19/22 0407 05/22/22 2338 05/23/22 0209 TROPONINIHS 194* 202* 197* 478* 462*     Chemistry Last Labs Recent  Labs Lab 06/02/22 1057 06/08/22 0223 NA 143 134* K 4.1 3.7 CL 109 102 CO2 26 24 GLUCOSE 132* 143* BUN 34* 46* CREATININE 4.96* 5.72* CALCIUM 8.9 8.8* GFRNONAA 13* 11* ANIONGAP 8 8    Lipids  Last Labs No results for input(s): "CHOL", "TRIG", "HDL", "LABVLDL", "LDLCALC", "CHOLHDL" in the last 168 hours.   Hematology Last Labs Recent Labs Lab 06/02/22 1057 06/08/22 0223 WBC 5.8 5.1 RBC 3.15* 2.78* HGB 9.8* 8.8* HCT 28.5* 24.9* MCV 90.5 89.6 MCH 31.1 31.7 MCHC 34.4 35.3 RDW 12.2 12.5 PLT 222 182    Thyroid  Last Labs No results for input(s): "TSH", "FREET4" in the last 168 hours.   BNP Last Labs No results for input(s): "BNP", "PROBNP" in the last 168 hours.   DDimer  Last Labs No results for input(s): "DDIMER" in the last 168 hours.     Radiology    Imaging Results (Last 48 hours) CARDIAC CATHETERIZATION   Result Date: 06/07/2022   Prox LAD lesion is 40% stenosed.   1st Diag lesion is 70% stenosed.   Ost LAD to Prox LAD lesion is 50% stenosed.   Mid LAD lesion is 80% stenosed.   A drug-eluting stent was successfully placed using a SYNERGY XD [id number].   Post intervention, there is a 0% residual stenosis. Successful OCT guided orbital atherectomy and drug-eluting stent placement to the mid LAD. Recommendations: Dual platelet therapy for at least 12 months. Aggressive treatment of risk factors. The patient does have moderate ostial LAD stenosis.  If this worsens in the future, the location is not optimal PCI and might be best served with CABG.      Cardiac Studies   Cardiac catheterization 06/07/2022:    Prox LAD lesion is 40% stenosed.  1st Diag lesion is 70% stenosed.   Ost LAD to Prox LAD lesion is 50% stenosed.   Mid LAD lesion is 80% stenosed.   A drug-eluting stent was successfully placed using a SYNERGY XD [id number].   Post intervention, there is a 0% residual stenosis.   Successful OCT guided orbital atherectomy and drug-eluting stent placement  to the mid LAD.   Recommendations: Dual platelet therapy for at least 12 months. Aggressive treatment of risk factors. The patient does have moderate ostial LAD stenosis.  If this worsens in the future, the location is not optimal PCI and might be best served with CABG.   Diagnostic Dominance: Right  Intervention    Echocardiogram 05/18/2022:   1. Left ventricular ejection fraction, by estimation, is 40 to 45%. The  left ventricle has mildly decreased function. The left ventricle  demonstrates global hypokinesis. There is mild left ventricular  hypertrophy. Small region of noncompaction noted in  the apical region, less likely mural thrombus (noted on prior studies).  Left ventricular diastolic parameters are consistent with Grade II  diastolic dysfunction (pseudonormalization).   2. Right ventricular systolic function is normal. The right ventricular  size is normal. Tricuspid regurgitation signal is inadequate for assessing  PA pressure.   3. The mitral valve is normal in structure. No evidence of mitral valve  regurgitation. No evidence of mitral stenosis.   4. The aortic valve was not well visualized. Aortic valve regurgitation  is not visualized. No aortic stenosis is present.   5. The inferior vena cava is normal in size with greater than 50%  respiratory variability, suggesting right atrial pressure of 3 mmHg.    Patient Profile    61 y.o. male post PCI 06/07/2022 to LAD with atherectomy and stent with cardiomyopathy ejection fraction 40 to 45% end-stage renal disease on hemodialysis type 2 diabetes hypertension hyperlipidemia prior TIA history of DVT.  Dialysis Monday Wednesday Friday   Assessment & Plan   Coronary artery disease - PCI as above LAD with atherectomy.  Dual antiplatelet therapy for 1 year.  Watch for any signs of bleeding.  Watch for any signs of angina.  Continue with current home medications as well.   End-stage renal disease - Monday Wednesday  Friday hemo-dialysis in [place].  He would be discharged this morning for dialysis session.   Ischemic cardiomyopathy - Ejection fraction 40 to 45%.  Continue goal-directed medical therapy.   History of non-STEMI - February 2023.  Goal-directed medical therapy.  Aspirin Plavix beta-blocker high intensity statin   Hyperlipidemia - LDL 45.  Excellent.  Crestor 20 mg high intensity dose   Diabetes with coronary artery disease - Hemoglobin A1c 10.1.  Continue to work: As outpatient.   History of DVT left axillary vein in December 2022.  Apixaban has been discontinued.  Currently on aspirin and clopidogrel 75.   Anemia of chronic kidney disease - Hemoglobin 8.8.  No signs of bleeding post catheterization.   For questions or updates, please contact [place] Please consult [email or web address] for contact info under         Signed, Candee Furbish, MD

## 2022-06-08 NOTE — Progress Notes (Signed)
Upon my arrival pt is dressed with bags packed. Pt received materials on exercise, NTG use, s/s to stop exercise, temperature precautions, and diet in Spanish and education on CRPII. Pt understands restrictions. Referred to [facility]

## 2022-06-08 NOTE — Progress Notes (Signed)
Jimmy Olson to be D/C'd Home per MD order.  Discussed with the patient and all questions fully answered.  VSS, Skin clean, dry and intact without evidence of skin break down, no evidence of skin tears noted. IV catheter discontinued intact. Site without signs and symptoms of complications. Dressing and pressure applied.  An After Visit Summary was printed and given to the patient.   D/c education completed with patient/family including follow up instructions, medication list, d/c activities limitations if indicated, with other d/c instructions as indicated by MD - patient able to verbalize understanding, all questions fully answered.   Patient instructed to return to ED, call 911, or call MD for any changes in condition.   Patient d/c home via private auto with wife.  Manuella Ghazi 06/08/2022 10:19 AM

## 2022-06-08 NOTE — Progress Notes (Signed)
Rounding Note    Patient Name: Jimmy Olson Date of Encounter: 06/08/2022  [place] Cardiologist: Ida Rogue, MD   Subjective   Feels well, ready to go home.  Hemodialysis today.  Inpatient Medications    Scheduled Meds:  aspirin EC  81 mg Oral Daily   carvedilol  25 mg Oral BID   Chlorhexidine Gluconate Cloth  6 each Topical Daily   clopidogrel  75 mg Oral Daily   escitalopram  10 mg Oral QHS   feeding supplement (NEPRO CARB STEADY)  237 mL Oral TID BM   hydrALAZINE  100 mg Oral Q8H   insulin aspart  0-15 Units Subcutaneous TID WC   insulin detemir  15 Units Subcutaneous QHS   isosorbide mononitrate  60 mg Oral Daily   losartan  100 mg Oral Daily   pantoprazole  20 mg Oral Daily   rosuvastatin  40 mg Oral Daily   sodium chloride flush  3 mL Intravenous Q12H   [START ON 06/10/2022] torsemide  20 mg Oral Once per day on Sun Sat   Continuous Infusions:  sodium chloride     PRN Meds: sodium chloride, acetaminophen, nitroGLYCERIN, ondansetron (ZOFRAN) IV, sodium chloride flush   Vital Signs    Vitals:   06/07/22 1616 06/07/22 2037 06/08/22 0034 06/08/22 0615  BP: (!) 164/84 120/87 (!) 144/66 (!) 156/72  Pulse: 70 82 76 80  Resp: 16 17 18 18   Temp:  98.1 F (36.7 C)  98.2 F (36.8 C)  TempSrc:  Oral  Oral  SpO2: 98% 100% 98% 99%  Weight:      Height:        Intake/Output Summary (Last 24 hours) at 06/08/2022 0744 Last data filed at 06/08/2022 0618 Gross per 24 hour  Intake --  Output 350 ml  Net -350 ml      06/07/2022   10:44 AM 06/02/2022    9:41 AM 05/28/2022    5:00 AM  Last 3 Weights  Weight (lbs) 151 lb 12.8 oz 151 lb 12.8 oz 143 lb 1.3 oz  Weight (kg) 68.856 kg 68.856 kg 64.9 kg      Telemetry    Sinus rhythm, occasional PVCs- Personally Reviewed  ECG    Prior EKG shows sinus rhythm 76 LVH early repolarization ST depression in the lateral leads.- Personally Reviewed  Physical Exam   GEN: No acute distress.    Neck: No JVD Cardiac: RRR, no murmurs, rubs, or gallops.  Cardiac catheterization site clean dry and intact Respiratory: Clear to auscultation bilaterally. GI: Soft, nontender, non-distended  MS: No edema; No deformity. Neuro:  Nonfocal  Psych: Normal affect   Labs    High Sensitivity Troponin:   Recent Labs  Lab 05/18/22 0850 05/19/22 0218 05/19/22 0407 05/22/22 2338 05/23/22 0209  TROPONINIHS 194* 202* 197* 478* 462*     Chemistry Recent Labs  Lab 06/02/22 1057 06/08/22 0223  NA 143 134*  K 4.1 3.7  CL 109 102  CO2 26 24  GLUCOSE 132* 143*  BUN 34* 46*  CREATININE 4.96* 5.72*  CALCIUM 8.9 8.8*  GFRNONAA 13* 11*  ANIONGAP 8 8    Lipids No results for input(s): "CHOL", "TRIG", "HDL", "LABVLDL", "LDLCALC", "CHOLHDL" in the last 168 hours.  Hematology Recent Labs  Lab 06/02/22 1057 06/08/22 0223  WBC 5.8 5.1  RBC 3.15* 2.78*  HGB 9.8* 8.8*  HCT 28.5* 24.9*  MCV 90.5 89.6  MCH 31.1 31.7  MCHC 34.4 35.3  RDW 12.2  12.5  PLT 222 182   Thyroid No results for input(s): "TSH", "FREET4" in the last 168 hours.  BNPNo results for input(s): "BNP", "PROBNP" in the last 168 hours.  DDimer No results for input(s): "DDIMER" in the last 168 hours.   Radiology    CARDIAC CATHETERIZATION  Result Date: 06/07/2022   Prox LAD lesion is 40% stenosed.   1st Diag lesion is 70% stenosed.   Ost LAD to Prox LAD lesion is 50% stenosed.   Mid LAD lesion is 80% stenosed.   A drug-eluting stent was successfully placed using a SYNERGY XD [id number].   Post intervention, there is a 0% residual stenosis. Successful OCT guided orbital atherectomy and drug-eluting stent placement to the mid LAD. Recommendations: Dual platelet therapy for at least 12 months. Aggressive treatment of risk factors. The patient does have moderate ostial LAD stenosis.  If this worsens in the future, the location is not optimal PCI and might be best served with CABG.    Cardiac Studies   Cardiac catheterization  06/07/2022:    Prox LAD lesion is 40% stenosed.   1st Diag lesion is 70% stenosed.   Ost LAD to Prox LAD lesion is 50% stenosed.   Mid LAD lesion is 80% stenosed.   A drug-eluting stent was successfully placed using a SYNERGY XD [id number].   Post intervention, there is a 0% residual stenosis.   Successful OCT guided orbital atherectomy and drug-eluting stent placement to the mid LAD.   Recommendations: Dual platelet therapy for at least 12 months. Aggressive treatment of risk factors. The patient does have moderate ostial LAD stenosis.  If this worsens in the future, the location is not optimal PCI and might be best served with CABG.  Diagnostic Dominance: Right  Intervention   Echocardiogram 05/18/2022:   1. Left ventricular ejection fraction, by estimation, is 40 to 45%. The  left ventricle has mildly decreased function. The left ventricle  demonstrates global hypokinesis. There is mild left ventricular  hypertrophy. Small region of noncompaction noted in  the apical region, less likely mural thrombus (noted on prior studies).  Left ventricular diastolic parameters are consistent with Grade II  diastolic dysfunction (pseudonormalization).   2. Right ventricular systolic function is normal. The right ventricular  size is normal. Tricuspid regurgitation signal is inadequate for assessing  PA pressure.   3. The mitral valve is normal in structure. No evidence of mitral valve  regurgitation. No evidence of mitral stenosis.   4. The aortic valve was not well visualized. Aortic valve regurgitation  is not visualized. No aortic stenosis is present.   5. The inferior vena cava is normal in size with greater than 50%  respiratory variability, suggesting right atrial pressure of 3 mmHg.   Patient Profile     61 y.o. male post PCI 06/07/2022 to LAD with atherectomy and stent with cardiomyopathy ejection fraction 40 to 45% end-stage renal disease on hemodialysis type 2 diabetes  hypertension hyperlipidemia prior TIA history of DVT.  Dialysis Monday Wednesday Friday  Assessment & Plan    Coronary artery disease - PCI as above LAD with atherectomy.  Dual antiplatelet therapy for 1 year.  Watch for any signs of bleeding.  Watch for any signs of angina.  Continue with current home medications as well.  End-stage renal disease - Monday Wednesday Friday hemo-dialysis in [place].  He would be discharged this morning for dialysis session.  Ischemic cardiomyopathy - Ejection fraction 40 to 45%.  Continue goal-directed medical therapy.  History  of non-STEMI - February 2023.  Goal-directed medical therapy.  Aspirin Plavix beta-blocker high intensity statin  Hyperlipidemia - LDL 45.  Excellent.  Crestor 20 mg high intensity dose  Diabetes with coronary artery disease - Hemoglobin A1c 10.1.  Continue to work: As outpatient.  History of DVT left axillary vein in December 2022.  Apixaban has been discontinued.  Currently on aspirin and clopidogrel 75.  Anemia of chronic kidney disease - Hemoglobin 8.8.  No signs of bleeding post catheterization.  For questions or updates, please contact [place] Please consult [email or web address] for contact info under        Signed, Candee Furbish, MD  06/08/2022, 7:44 AM

## 2022-06-23 ENCOUNTER — Ambulatory Visit: Payer: Medicare Other | Attending: Cardiology | Admitting: Cardiology

## 2022-06-23 ENCOUNTER — Encounter: Payer: Self-pay | Admitting: Cardiology

## 2022-06-23 ENCOUNTER — Other Ambulatory Visit: Payer: Self-pay

## 2022-06-23 VITALS — BP 150/70 | HR 79 | Ht 66.0 in | Wt 153.0 lb

## 2022-06-23 DIAGNOSIS — I25118 Atherosclerotic heart disease of native coronary artery with other forms of angina pectoris: Secondary | ICD-10-CM

## 2022-06-23 DIAGNOSIS — Z794 Long term (current) use of insulin: Secondary | ICD-10-CM | POA: Diagnosis present

## 2022-06-23 DIAGNOSIS — E1122 Type 2 diabetes mellitus with diabetic chronic kidney disease: Secondary | ICD-10-CM

## 2022-06-23 DIAGNOSIS — N186 End stage renal disease: Secondary | ICD-10-CM

## 2022-06-23 DIAGNOSIS — I5022 Chronic systolic (congestive) heart failure: Secondary | ICD-10-CM | POA: Diagnosis not present

## 2022-06-23 DIAGNOSIS — Z86718 Personal history of other venous thrombosis and embolism: Secondary | ICD-10-CM

## 2022-06-23 DIAGNOSIS — Z992 Dependence on renal dialysis: Secondary | ICD-10-CM | POA: Diagnosis present

## 2022-06-23 DIAGNOSIS — E782 Mixed hyperlipidemia: Secondary | ICD-10-CM | POA: Diagnosis present

## 2022-06-23 DIAGNOSIS — I10 Essential (primary) hypertension: Secondary | ICD-10-CM

## 2022-06-23 NOTE — Progress Notes (Signed)
Cardiology Clinic Note   Patient Name: Jimmy Olson Date of Encounter: 06/23/2022  Primary Care Provider:  [facility] [place] Primary Cardiologist:  Ida Rogue, MD  Patient Profile  In person interpreter was used for all communication done during the visit today 62 year old male with ischemic and nonischemic cardiomyopathy, HFrEF, obstructive coronary disease, end-stage renal disease on dialysis, type 2 diabetes, hypertension, hyperlipidemia, TIA, history of DVT, who is being seen today for hospital follow-up from coronary atherectomy and stent placement.  Past Medical History    Past Medical History:  Diagnosis Date   Anemia    Anxiety and depression    ATN (acute tubular necrosis) (HCC)    CAD (coronary artery disease)    a.) R/LHC 07/19/2020: 20% pRCA, 30% mRCA, 50% dRCA, 50% RPAV, 40% pLAD, 70% D1, 60% mLAD; LVEDP 19 mmHg; PA 37/16 (26 mmHg); PCWP 13 mmHg. b.) LHC 06/03/2021: EF 45-50%; LVEDP 18 mmHg; 40% pLAD, 60% mLAD, 20% pRCA, 30% mRCA, 70% D1, 30% dRCA, 40% RPDA.   Chronic HFrEF (heart failure with reduced ejection fraction) ([place])    a.) 06/2020 Echo: EF 30-35%, glob HK, GrII DD, nl RV fxn. Mild LAE. b.)  TTE 08/08/2021: EF 40-45%; global HK; mild LVH; GLS -8.6; mild LA dilation.   DVT of axillary vein, acute left ([place]) 05/2021   a.) Tx'd with apixaban   ESRD (end stage renal disease) on dialysis [facility])    a.) M-W-F   History of 2019 novel coronavirus disease (COVID-19) 03/06/2020   a.) s/p Tx with monoclonal Ab infusion   HLD (hyperlipidemia)    Hyperkalemia 12/23/2020   Hypertension    Left carotid artery stenosis 08/18/2021   a.) Carotid Doppler 42/70/6237: 62-83% LICA stenosis   Long term current use of anticoagulant    a.) apixaban for DVT   NICM (nonischemic cardiomyopathy) ([place])    a.) 07/2009 MV: EF 48%; b.) 08/2019 Echo: EF 45-50%. Global HK. Mod LVH. GrIDD; c.) 06/2020 Echo: EF 30-35%, glob HK, mild LVH, GrII DD, nl RV fxn, mild LAE; d.)  06/2020 Cath: nonobs dzs. e.)  LHC 06/03/2021; EF 45-50%; LVEDP 18 mmHg; nonobstructive CAD. f.) b.)  TTE 08/08/2021: EF 40-45%; global HK; mild LVH; GLS -8.6; mild LA dilation.   NSTEMI (non-ST elevated myocardial infarction) ([place]) 07/15/2020   a.) hsTnI trended: 124--> 127--> 176 --> 190 --> 200 ng/L.   Sepsis ([place])    Suicidal ideations    TIA (transient ischemic attack) 06/2021   Type 2 diabetes mellitus treated with insulin [facility])    Past Surgical History:  Procedure Laterality Date   APPENDECTOMY     AV FISTULA PLACEMENT Left 09/29/2021   Procedure: ARTERIOVENOUS (AV) FISTULA CREATION ( RADIAL CEPHALIC);  Surgeon: Algernon Huxley, MD;  Location: [facility] ORS;  Service: Vascular;  Laterality: Left;   CARDIAC CATHETERIZATION     CORONARY ATHERECTOMY N/A 06/07/2022   Procedure: CORONARY ATHERECTOMY;  Surgeon: Wellington Hampshire, MD;  Location: [place] CV LAB;  Service: Cardiovascular;  Laterality: N/A;   CORONARY STENT INTERVENTION N/A 06/07/2022   Procedure: CORONARY STENT INTERVENTION;  Surgeon: Wellington Hampshire, MD;  Location: [place] CV LAB;  Service: Cardiovascular;  Laterality: N/A;   DIALYSIS/PERMA CATHETER INSERTION N/A 06/02/2021   Procedure: DIALYSIS/PERMA CATHETER INSERTION;  Surgeon: Algernon Huxley, MD;  Location: [place] CV LAB;  Service: Cardiovascular;  Laterality: N/A;   INTRAVASCULAR IMAGING/OCT N/A 06/07/2022   Procedure: INTRAVASCULAR IMAGING/OCT;  Surgeon: Wellington Hampshire, MD;  Location: [place] CV LAB;  Service: Cardiovascular;  Laterality: N/A;   LEFT HEART CATH AND CORONARY ANGIOGRAPHY N/A 06/03/2021   Procedure: LEFT HEART CATH AND CORONARY ANGIOGRAPHY;  Surgeon: Wellington Hampshire, MD;  Location: [place] CV LAB;  Service: Cardiovascular;  Laterality: N/A;   LIGATION OF ARTERIOVENOUS  FISTULA Left 10/02/2021   Procedure: LIGATION OF ARTERIOVENOUS  FISTULA;  Surgeon: Evaristo Bury, MD;  Location: [facility] ORS;  Service: Vascular;  Laterality: Left;    NASAL SINUS SURGERY     RIGHT/LEFT HEART CATH AND CORONARY ANGIOGRAPHY N/A 07/19/2020   Procedure: RIGHT/LEFT HEART CATH AND CORONARY ANGIOGRAPHY;  Surgeon: Wellington Hampshire, MD;  Location: [place] CV LAB;  Service: Cardiovascular;  Laterality: N/A;   RIGHT/LEFT HEART CATH AND CORONARY ANGIOGRAPHY N/A 05/26/2022   Procedure: RIGHT/LEFT HEART CATH AND CORONARY ANGIOGRAPHY;  Surgeon: Wellington Hampshire, MD;  Location: [place] CV LAB;  Service: Cardiovascular;  Laterality: N/A;    Allergies  No Known Allergies  History of Present Illness    Jimmy Olson is a 62 year old male with previously mentioned past medical history insulin-dependent type 2 diabetes, chronic systolic congestive heart failure with an EF of 45%, end-stage renal disease initiated on dialysis in December 2022, anemia of chronic disease, two-vessel coronary artery disease on left heart catheterization in December 2022, history of DVT in the left axillary vein in December 2022 on chronic apixaban that was recently discontinued, recent admission for TIA with MRI showing ulcerated plaque in the left proximal left ICA.  He was recently sent for an outpatient procedure of coronary arthrectomy with DES placement to the mid LAD.  He was recently hospitalized with a non-STEMI in February 2023 with no interventions performed given prior catheterization in December 2022.  He was positive for COVID-19 and was considered for intervention in [place] given the need for arthrectomy.  He was readmitted 09/2021 to undergo ligation of the left radiocephalic AV fistula, also showed no evidence of hemodynamically significant stenosis.  Left upper extremity Doppler was negative for DVT.  He was evaluated by neurology recommended MRI of the brain which is negative for acute stroke.  He continued to suffer from left arm swelling and for pain with cellulitis versus lymphedema.  He had still syndrome as a complication of his dialysis access status  post emergent medication by vascular.  He was discharged on 10/08/2021.  Multiple emergency department visits since he was discharged in April.  He presented back to the emergency department for admission 05/08/2022 with complaints of shortness of breath, chest tightness on exertion he was continued with dialysis during hospitalization repeat echocardiogram revealed LVEF of 40-45%, G2 DD consistent with prior readings discharged on 05/19/2022.  He presented again to the [facility] emergency department 12//23 with complaints of shortness of breath.  He was continued on dialysis had not missed any sessions.  He was found to be hypertensive urgency with blood pressure 210/106.  He was placed on Cardene drip that was weaned off.  Underwent left heart catheterization which revealed progression of LAD stenosis 80% with moderate reduced LV systolic function EF 20% right heart catheterization revealed low filling pressures normal pulmonary pressure and mildly reduced cardiac output.  He had transient hypotension during the procedure and was given fluid bolus and started on Levophed.  He was subsequently discharged after aggressive diuresis to be set up for an outpatient LAD atherectomy and stent placement that was done at [facility].  On 06/07/2022 he underwent coronary arthrectomy with  DES placement to the mid LAD.  Was continued on dual antiplatelet therapy for minimum of 12 months, with aggressive treatment of risk factors.  If he continued to have worsening symptoms in the future he will likely need CABG.  He returns to clinic today accompanied by his wife stating that he has been and remains chest pain-free.  Denies any worsening shortness of breath, peripheral edema, or dizziness.  Continues to have neck pain since his shoulder was placed back in the socket several years prior.  Last ultrasound revealed no DVT to that arm.  Stated that they were discharged from [facility] after the procedure and he has done well since that  time.  He is concerned about nephrology trying to do another AV fistula as he has had failed attempts at this point.  Home Medications    Current Outpatient Medications  Medication Sig Dispense Refill   acetaminophen (TYLENOL) 325 MG tablet Take 650 mg by mouth every 6 (six) hours as needed.     albuterol (PROVENTIL HFA) 108 (90 Base) MCG/ACT inhaler INHALE 2 PUFFS INTO THE LUNGS ONCE EVERY 6 HOURS AS NEEDED FOR WHEEZING OR SHORTNESS OF BREATH. 6.7 g 10   aspirin EC 81 MG tablet Take 1 tablet (81 mg total) by mouth daily. Swallow whole. 90 tablet 3   carvedilol (COREG) 25 MG tablet Take 1 tablet (25 mg total) by mouth 2 (two) times daily. 180 tablet 3   clopidogrel (PLAVIX) 75 MG tablet Take 1 tablet (75 mg total) by mouth daily. 90 tablet 3   escitalopram (LEXAPRO) 10 MG tablet Take 1 tablet (10 mg total) by mouth at bedtime. 30 tablet 0   hydrALAZINE (APRESOLINE) 100 MG tablet Take 1 tablet (100 mg total) by mouth 2 (two) times daily. 60 tablet 0   insulin detemir (LEVEMIR FLEXPEN) 100 UNIT/ML FlexPen Inject 15 Units into the skin at bedtime. 15 mL 5   Insulin Pen Needle 32G X 4 MM MISC USE AS DIRECTED 100 each 0   isosorbide mononitrate (IMDUR) 30 MG 24 hr tablet Take 1 tablet (30 mg total) by mouth in the morning and at bedtime. 90 tablet 3   losartan (COZAAR) 100 MG tablet Take 1 tablet (100 mg total) by mouth daily. 30 tablet 0   nitroGLYCERIN (NITROSTAT) 0.4 MG SL tablet Place 1 tablet (0.4 mg total) under the tongue every 5 (five) minutes as needed for chest pain. 25 tablet 0   NOVOLOG FLEXPEN 100 UNIT/ML FlexPen Inject 0-15 Units into the skin 3 (three) times daily with meals. 0-15 units tid 0-5 units qhs     Nutritional Supplements (FEEDING SUPPLEMENT, NEPRO CARB STEADY,) LIQD Take 237 mLs by mouth 3 (three) times daily between meals.  0   pantoprazole (PROTONIX) 20 MG tablet Take 1 tablet (20 mg total) by mouth daily. 30 tablet 0   rosuvastatin (CRESTOR) 40 MG tablet Take 1 tablet  (40 mg total) by mouth daily. 30 tablet 0   torsemide (DEMADEX) 20 MG tablet Take 1 tablet (20 mg total) by mouth daily. Takes on Sat and Sun 30 tablet 0   No current facility-administered medications for this visit.     Family History    Family History  Problem Relation Age of Onset   Kidney failure Mother        died @ 30   Heart failure Mother    Other Father        he never knew his father   Diabetes Brother  He indicated that his mother is deceased. He indicated that his father is deceased. He indicated that his brother is alive.  Social History    Social History   Socioeconomic History   Marital status: Married    Spouse name: Ana   Number of children: 7   Years of education: Not on file   Highest education level: Not on file  Occupational History   Not on file  Tobacco Use   Smoking status: Never   Smokeless tobacco: Never  Vaping Use   Vaping Use: Never used  Substance and Sexual Activity   Alcohol use: Not Currently   Drug use: Never   Sexual activity: Not on file  Other Topics Concern   Not on file  Social History Narrative   Lives locally with wife and son.  He is currently unemployed - has worked in different industries.     Social Determinants of Health   Financial Resource Strain: Not on file  Food Insecurity: No Food Insecurity (05/24/2022)   Hunger Vital Sign    Worried About Running Out of Food in the Last Year: Never true    Ran Out of Food in the Last Year: Never true  Transportation Needs: No Transportation Needs (05/24/2022)   PRAPARE - Hydrologist (Medical): No    Lack of Transportation (Non-Medical): No  Physical Activity: Not on file  Stress: Not on file  Social Connections: Not on file  Intimate Partner Violence: Not At Risk (05/24/2022)   Humiliation, Afraid, Rape, and Kick questionnaire    Fear of Current or Ex-Partner: No    Emotionally Abused: No    Physically Abused: No    Sexually Abused: No      Review of Systems    General:  No chills, fever, night sweats or weight changes.  Endorses exertional fatigue Cardiovascular:  No chest pain, dyspnea on exertion, edema, orthopnea, palpitations, paroxysmal nocturnal dyspnea. Dermatological: No rash, lesions/masses Respiratory: No cough, dyspnea Urologic: No hematuria, dysuria Abdominal:   No nausea, vomiting, diarrhea, bright red blood per rectum, melena, or hematemesis Neurologic:  No visual changes, wkns, changes in mental status.  Endorses occasional dizziness after dialysis and neck pain from possible nerve injury from shoulder injury several years ago All other systems reviewed and are otherwise negative except as noted above.    Cardiac Rehabilitation Eligibility Assessment  The patient is NOT ready to start cardiac rehabilitation due to: Other (patient has second procedure scheduled 06/07/22 at [facility])    Physical Exam    VS:  BP (!) 150/60 (BP Location: Left Arm, Patient Position: Sitting, Cuff Size: Normal)   Pulse 79   Ht 5\' 6"  (1.676 m)   Wt 153 lb (69.4 kg)   SpO2 96%   BMI 24.69 kg/m  , BMI Body mass index is 24.69 kg/m.     GEN: Well nourished, well developed, in no acute distress. HEENT: normal. Neck: Supple, no JVD, carotid bruits, or masses. Cardiac: RRR, no murmurs, rubs, or gallops. No clubbing, cyanosis, edema.  Radials 2+/PT 2+ and equal bilaterally. Right groin cath site intact without pain or bruit.  Respiratory:  Respirations regular and unlabored, clear to auscultation bilaterally. GI: Soft, nontender, nondistended, BS + x 4. MS: no deformity or atrophy. Skin: warm and dry, no rash. Right upper chest hemodialysis port. Neuro:  Strength and sensation are intact. Psych: Normal affect.  Accessory Clinical Findings    ECG personally reviewed by me today- sinus rhythm  rate of 79 with LVH and st depression and t wave inversions - No acute changes  Lab Results  Component Value Date   WBC 5.1  06/08/2022   HGB 8.8 (L) 06/08/2022   HCT 24.9 (L) 06/08/2022   MCV 89.6 06/08/2022   PLT 182 06/08/2022   Lab Results  Component Value Date   CREATININE 5.72 (H) 06/08/2022   BUN 46 (H) 06/08/2022   NA 134 (L) 06/08/2022   K 3.7 06/08/2022   CL 102 06/08/2022   CO2 24 06/08/2022   Lab Results  Component Value Date   ALT 18 04/04/2022   AST 28 04/04/2022   ALKPHOS 81 04/04/2022   BILITOT 1.0 04/04/2022   Lab Results  Component Value Date   CHOL 113 10/02/2021   HDL 34 (L) 10/02/2021   LDLCALC 45 10/02/2021   TRIG 170 (H) 10/02/2021   CHOLHDL 3.3 10/02/2021    Lab Results  Component Value Date   HGBA1C 10.1 (H) 05/17/2022    Assessment & Plan   1.  Coronary artery disease with recent arthrectomy and DES placement to the mid LAD.  He remains chest pain-free today.  Recent catheterization completed 06/07/2022 revealed proximal LAD lesion is 40%, first diagonal lesion is 70% stenosis, ostial LAD to proximal LAD lesions 50% stenosis, mid LAD lesion is 80% stenosed, postintervention there was 0% residual stenosis.  Recommendations for dual antiplatelet therapy for at least 12 months, aggressive treatment of risk factors, patient does have a moderate ostial LAD stenosis, if this worsens in the future the location is not optimal for PCI and might be best served with CABG.  Patient is continued on aspirin 81 mg daily, carvedilol 25 mg twice daily, clopidogrel 75 mg daily, isosorbide mononitrate 30 mg twice daily, Nitrostat 0.4 mg sublingual as needed, and rosuvastatin 40 mg daily.  He has agreed to cardiac rehab.   2.  HFrEF last EF for left heart catheterization as well as at 35%, echocardiogram LVEF 40-45%, he is continued carvedilol 25 mg twice daily, Cozaar 100 mg daily, torsemide 20 mg by mouth on Saturday and Sunday.  Further fluid removal is done with dialysis.  3.  Hypertension with blood pressure today 150/60 retaken blood pressure equal to 150/70.  He is continued on  carvedilol 25 mg twice daily, hydralazine 100 mg twice daily, isosorbide mononitrate 30 mg twice daily, losartan 100 mg daily.  He requires no refills to any of his medications today and was continued on his current medication regimen.  4.  Mixed hyperlipidemia with LDL 45.  He is continued on rosuvastatin 40 mg daily.  5.  History of DVT to the left axillary vein in December 2022.  Last upper extremity duplex of left arm.  10/04/2021 which showed no evidence of focal arterial occlusion or stenosis on 4/78/23 venous Doppler showed no evidence of DVT.  He is continued off of apixaban and maintained on DAPT  6.  End-stage renal disease continued on hemodialysis on Tuesday/Thursday/Saturday.  Fluid removal typically managed by dialysis.  Right upper chest PermCath due to failed AV fistula on the left.  He continues to be managed by nephrology.  He also is awaiting return call from the [facility] transplant coordinator.  According to/transplant episode he was marked as ineligible 10/05/2021 stating that he did not meet criteria they are waiting for evaluation.  7.  Type 2 diabetes last hemoglobin A1c 10.1 on 05/17/2022.  He is maintained on insulin.  This continues to be managed by his  PCP.  8.  Disposition patient return to clinic to see MD/APP in 6 weeks or sooner if needed.  Shmiel Morton, NP 06/23/2022, 4:26 PM

## 2022-06-23 NOTE — Patient Instructions (Signed)
Medication Instructions:  No changes *If you need a refill on your cardiac medications before your next appointment, please call your pharmacy*   Lab Work: None ordered If you have labs (blood work) drawn today and your tests are completely normal, you will receive your results only by: [place] (if you have MyChart) OR A paper copy in the mail If you have any lab test that is abnormal or we need to change your treatment, we will call you to review the results.   Testing/Procedures: None ordered   Follow-Up: At [facility], you and your health needs are our priority.  As part of our continuing mission to provide you with exceptional heart care, we have created designated Provider Care Teams.  These Care Teams include your primary Cardiologist (physician) and Advanced Practice Providers (APPs -  Physician Assistants and Nurse Practitioners) who all work together to provide you with the care you need, when you need it.  We recommend signing up for the patient portal called "MyChart".  Sign up information is provided on this After Visit Summary.  MyChart is used to connect with patients for Virtual Visits (Telemedicine).  Patients are able to view lab/test results, encounter notes, upcoming appointments, etc.  Non-urgent messages can be sent to your provider as well.   To learn more about what you can do with MyChart, go to [email or web address].    Your next appointment:   6 week(s)  The format for your next appointment:   In Person  Provider:   You may see Ida Rogue, MD or one of the following Advanced Practice Providers on your designated Care Team:   Murray Hodgkins, NP Christell Faith, PA-C Cadence Kathlen Mody, PA-C Gerrie Nordmann, NP

## 2022-06-26 ENCOUNTER — Other Ambulatory Visit: Payer: Self-pay

## 2022-06-27 ENCOUNTER — Other Ambulatory Visit: Payer: Self-pay

## 2022-06-27 ENCOUNTER — Other Ambulatory Visit: Payer: Self-pay | Admitting: Cardiology

## 2022-06-27 MED FILL — Pantoprazole Sodium EC Tab 20 MG (Base Equiv): ORAL | 16 days supply | Qty: 16 | Fill #0 | Status: AC

## 2022-06-27 MED FILL — Torsemide Tab 20 MG: ORAL | 28 days supply | Qty: 8 | Fill #0 | Status: CN

## 2022-06-27 MED FILL — Pantoprazole Sodium EC Tab 20 MG (Base Equiv): ORAL | 14 days supply | Qty: 14 | Fill #0 | Status: AC

## 2022-07-07 ENCOUNTER — Emergency Department: Payer: Medicare Other

## 2022-07-07 ENCOUNTER — Encounter: Payer: Self-pay | Admitting: Emergency Medicine

## 2022-07-07 ENCOUNTER — Emergency Department
Admission: EM | Admit: 2022-07-07 | Discharge: 2022-07-08 | Disposition: A | Discharge status: Home / Self Care | Payer: Medicare Other | Attending: Emergency Medicine | Admitting: Emergency Medicine

## 2022-07-07 DIAGNOSIS — Z20822 Contact with and (suspected) exposure to covid-19: Secondary | ICD-10-CM | POA: Diagnosis not present

## 2022-07-07 DIAGNOSIS — J441 Chronic obstructive pulmonary disease with (acute) exacerbation: Secondary | ICD-10-CM

## 2022-07-07 DIAGNOSIS — E1122 Type 2 diabetes mellitus with diabetic chronic kidney disease: Secondary | ICD-10-CM | POA: Insufficient documentation

## 2022-07-07 DIAGNOSIS — E119 Type 2 diabetes mellitus without complications: Secondary | ICD-10-CM

## 2022-07-07 DIAGNOSIS — Z992 Dependence on renal dialysis: Secondary | ICD-10-CM | POA: Insufficient documentation

## 2022-07-07 DIAGNOSIS — I509 Heart failure, unspecified: Secondary | ICD-10-CM | POA: Insufficient documentation

## 2022-07-07 DIAGNOSIS — N186 End stage renal disease: Secondary | ICD-10-CM | POA: Diagnosis not present

## 2022-07-07 DIAGNOSIS — R079 Chest pain, unspecified: Secondary | ICD-10-CM | POA: Diagnosis present

## 2022-07-07 LAB — COMPREHENSIVE METABOLIC PANEL
ALT: 16 U/L (ref 0–44)
AST: 32 U/L (ref 15–41)
Albumin: 3.4 g/dL — ABNORMAL LOW (ref 3.5–5.0)
Alkaline Phosphatase: 97 U/L (ref 38–126)
Anion gap: 9 (ref 5–15)
BUN: 39 mg/dL — ABNORMAL HIGH (ref 6–20)
CO2: 23 mmol/L (ref 22–32)
Calcium: 8.9 mg/dL (ref 8.9–10.3)
Chloride: 101 mmol/L (ref 98–111)
Creatinine, Ser: 4.44 mg/dL — ABNORMAL HIGH (ref 0.61–1.24)
GFR, Estimated: 14 mL/min — ABNORMAL LOW (ref >60)
Glucose, Bld: 423 mg/dL — ABNORMAL HIGH (ref 70–99)
Potassium: 4.9 mmol/L (ref 3.5–5.1)
Sodium: 133 mmol/L — ABNORMAL LOW (ref 135–145)
Total Bilirubin: 1.1 mg/dL (ref 0.3–1.2)
Total Protein: 7.2 g/dL (ref 6.5–8.1)

## 2022-07-07 LAB — CBC WITH DIFFERENTIAL/PLATELET
Abs Immature Granulocytes: 0.05 10*3/uL (ref 0.00–0.07)
Basophils Absolute: 0.1 10*3/uL (ref 0.0–0.1)
Basophils Relative: 1 %
Eosinophils Absolute: 0.3 10*3/uL (ref 0.0–0.5)
Eosinophils Relative: 6 %
HCT: 26.5 % — ABNORMAL LOW (ref 39.0–52.0)
Hemoglobin: 9 g/dL — ABNORMAL LOW (ref 13.0–17.0)
Immature Granulocytes: 1 %
Lymphocytes Relative: 18 %
Lymphs Abs: 1 10*3/uL (ref 0.7–4.0)
MCH: 30.4 pg (ref 26.0–34.0)
MCHC: 34 g/dL (ref 30.0–36.0)
MCV: 89.5 fL (ref 80.0–100.0)
Monocytes Absolute: 0.5 10*3/uL (ref 0.1–1.0)
Monocytes Relative: 9 %
Neutro Abs: 3.7 10*3/uL (ref 1.7–7.7)
Neutrophils Relative %: 65 %
Platelets: 210 10*3/uL (ref 150–400)
RBC: 2.96 MIL/uL — ABNORMAL LOW (ref 4.22–5.81)
RDW: 12.7 % (ref 11.5–15.5)
WBC: 5.6 10*3/uL (ref 4.0–10.5)
nRBC: 0 % (ref 0.0–0.2)

## 2022-07-07 MED ORDER — PREDNISONE 20 MG PO TABS: 40.0000 mg | ORAL_TABLET | Freq: Every day | ORAL | 0 refills | Status: AC

## 2022-07-07 MED ORDER — ALBUTEROL SULFATE HFA 108 (90 BASE) MCG/ACT IN AERS: 2.0000 | INHALATION_SPRAY | RESPIRATORY_TRACT | 0 refills | Status: DC | PRN

## 2022-07-07 MED ORDER — ALBUTEROL SULFATE (2.5 MG/3ML) 0.083% IN NEBU
5.0000 mg | INHALATION_SOLUTION | Freq: Once | RESPIRATORY_TRACT | Status: AC
  Administered 2022-07-07: 5 mg via RESPIRATORY_TRACT
  Filled 2022-07-07: qty 6

## 2022-07-07 MED ORDER — DOXYCYCLINE HYCLATE 100 MG PO CAPS: 100.0000 mg | ORAL_CAPSULE | Freq: Two times a day (BID) | ORAL | 0 refills | Status: DC

## 2022-07-07 NOTE — ED Provider Notes (Signed)
[facility] Provider Note    Event Date/Time   First MD Initiated Contact with Patient 07/07/22 2149     (approximate)   History   Chief Complaint: Chest Pain   HPI  Jimmy Olson is a 62 y.o. male with a history of diabetes, heart failure, ESRD on dialysis on Tuesday Thursday Saturday treatments who complains of chest pain, cough, shortness of breath that started about 7:00 PM today.  No abdominal pain, no fever.  Had a normal dialysis session yesterday.     Physical Exam   Triage Vital Signs: ED Triage Vitals  Enc Vitals Group     BP 07/07/22 2149 (!) 203/95     Pulse Rate 07/07/22 2149 96     Resp 07/07/22 2149 (!) 25     Temp 07/07/22 2149 98.2 F (36.8 C)     Temp Source 07/07/22 2149 Oral     SpO2 07/07/22 2149 91 %     Weight --      Height --      Head Circumference --      Peak Flow --      Pain Score 07/07/22 2150 6     Pain Loc --      Pain Edu? --      Excl. in [place]? --     Most recent vital signs: Vitals:   07/07/22 2230 07/07/22 2330  BP: (!) 164/89 (!) 177/97  Pulse: 88 82  Resp: 16 15  Temp:    SpO2: 92% 93%    General: Awake, no distress.  CV:  Good peripheral perfusion. RRR, normal distal pulses Resp:  Normal effort. Expiratory wheezing, mildly prolonged expiratory phase Abd:  No distention. Soft nontender Other:  No peripheral edema.   ED Results / Procedures / Treatments   Labs (all labs ordered are listed, but only abnormal results are displayed) Labs Reviewed  COMPREHENSIVE METABOLIC PANEL - Abnormal; Notable for the following components:      Result Value   Sodium 133 (*)    Glucose, Bld 423 (*)    BUN 39 (*)    Creatinine, Ser 4.44 (*)    Albumin 3.4 (*)    GFR, Estimated 14 (*)    All other components within normal limits  CBC WITH DIFFERENTIAL/PLATELET - Abnormal; Notable for the following components:   RBC 2.96 (*)    Hemoglobin 9.0 (*)    HCT 26.5 (*)    All other components within normal  limits  RESP PANEL BY RT-PCR (RSV, FLU A&B, COVID)  [id number]     EKG Interpreted by me Sinus rhythm rate of 97.  Normal axis and intervals.  Normal QRS ST segments.  There is slight flattening of the T waves in the lateral leads, unchanged compared to previous EKGs on June 23, 2022 and June 08, 2022.   RADIOLOGY Chest x-ray interpreted by me, appears unremarkable.  Radiology report reviewed.   PROCEDURES:  Procedures   MEDICATIONS ORDERED IN ED: Medications  albuterol (PROVENTIL) (2.5 MG/3ML) 0.083% nebulizer solution 5 mg (5 mg Nebulization Given 07/07/22 2156)     IMPRESSION / MDM / ASSESSMENT AND PLAN / ED COURSE  I reviewed the triage vital signs and the nursing notes.                              Differential diagnosis includes, but is not limited to, COPD exacerbation, acute bronchitis, viral illness,  pneumonia, pleural effusion, electrolyte abnormality  Patient's presentation is most consistent with acute presentation with potential threat to life or bodily function.  Patient presents with shortness of breath and chest tightness, found to be wheezing with exam findings of bronchospasm.  Not septic, doubt pneumonia, doubt ACS PE dissection or volume overload.  Patient given albuterol nebulizer treatments with substantial improvement in his symptoms.  Repeat auscultation reveals resolution of the wheezing and normalization of his expiratory phase.  Will treat for acute bronchitis with prednisone, albuterol, doxycycline.       FINAL CLINICAL IMPRESSION(S) / ED DIAGNOSES   Final diagnoses:  COPD exacerbation ([place])  ESRD on hemodialysis ([place])  Type 2 diabetes mellitus without complication, with long-term current use of insulin ([place])     Rx / DC Orders   ED Discharge Orders          Ordered    predniSONE (DELTASONE) 20 MG tablet  Daily with breakfast        07/07/22 2346    albuterol (PROVENTIL HFA) 108 (90 Base) MCG/ACT inhaler  Every 4 hours PRN         07/07/22 2346    doxycycline (VIBRAMYCIN) 100 MG capsule  2 times daily        07/07/22 2346             Note:  This document was prepared using Dragon voice recognition software and may include unintentional dictation errors.   Carrie Mew, MD 07/07/22 2348

## 2022-07-07 NOTE — ED Triage Notes (Signed)
Pt presents via [facility] with complaints of CP that started 2 hours ago. Pt is on dialysis (T/Tr/Sat - compliant with treatment) & Hx of CHF. Per EMS, pt had abnormal EKG. Pt received 2 nitro sprays, 2 inch nitro paste, and 324mg  ASA PTA with EMS.

## 2022-07-08 DIAGNOSIS — I132 Hypertensive heart and chronic kidney disease with heart failure and with stage 5 chronic kidney disease, or end stage renal disease: Secondary | ICD-10-CM | POA: Diagnosis not present

## 2022-07-08 DIAGNOSIS — J810 Acute pulmonary edema: Secondary | ICD-10-CM | POA: Diagnosis not present

## 2022-07-08 LAB — RESP PANEL BY RT-PCR (RSV, FLU A&B, COVID)  RVPGX2
Influenza A by PCR: NEGATIVE
Influenza B by PCR: NEGATIVE
Resp Syncytial Virus by PCR: NEGATIVE
SARS Coronavirus 2 by RT PCR: NEGATIVE

## 2022-07-08 NOTE — ED Provider Notes (Signed)
-----------------------------------------   12:25 AM on 07/08/2022 -----------------------------------------  Assuming care from Dr. Joni Fears.  In short, Jimmy Olson is a 62 y.o. male with a chief complaint of cough and SOB.  Refer to the original H&P for additional details.  The current plan of care is to follow up on respiratory viral panel and prescribe molnupiravir if COVID+.   Clinical Course as of 07/08/22 0115  Sat Jul 08, 2022  0114 Resp panel by RT-PCR (RSV, Flu A&B, Covid) Anterior Nasal Swab Negative respiratory viral panel, will proceed with discharge as per Dr. Jerene Canny recommendations. [CF]    Clinical Course User Index [CF] Hinda Kehr, MD     Medications  albuterol (PROVENTIL) (2.5 MG/3ML) 0.083% nebulizer solution 5 mg (5 mg Nebulization Given 07/07/22 2156)     ED Discharge Orders          Ordered    predniSONE (DELTASONE) 20 MG tablet  Daily with breakfast        07/07/22 2346    albuterol (PROVENTIL HFA) 108 (90 Base) MCG/ACT inhaler  Every 4 hours PRN        07/07/22 2346    doxycycline (VIBRAMYCIN) 100 MG capsule  2 times daily        07/07/22 2346           Final diagnoses:  COPD exacerbation ([place])  ESRD on hemodialysis ([place])  Type 2 diabetes mellitus without complication, with long-term current use of insulin (HCC)     Hinda Kehr, MD 07/08/22 [id number]

## 2022-07-10 ENCOUNTER — Encounter: Payer: Self-pay | Admitting: Emergency Medicine

## 2022-07-10 ENCOUNTER — Emergency Department: Payer: Medicare Other

## 2022-07-10 ENCOUNTER — Other Ambulatory Visit: Payer: Self-pay

## 2022-07-10 ENCOUNTER — Inpatient Hospital Stay
Admission: EM | Admit: 2022-07-10 | Discharge: 2022-07-15 | DRG: 286 | Disposition: A | Discharge status: Home / Self Care | Payer: Medicare Other | Attending: Internal Medicine | Admitting: Internal Medicine

## 2022-07-10 DIAGNOSIS — I252 Old myocardial infarction: Secondary | ICD-10-CM

## 2022-07-10 DIAGNOSIS — I132 Hypertensive heart and chronic kidney disease with heart failure and with stage 5 chronic kidney disease, or end stage renal disease: Principal | ICD-10-CM | POA: Diagnosis present

## 2022-07-10 DIAGNOSIS — E785 Hyperlipidemia, unspecified: Secondary | ICD-10-CM | POA: Diagnosis present

## 2022-07-10 DIAGNOSIS — J9621 Acute and chronic respiratory failure with hypoxia: Secondary | ICD-10-CM | POA: Diagnosis present

## 2022-07-10 DIAGNOSIS — F32A Depression, unspecified: Secondary | ICD-10-CM | POA: Diagnosis present

## 2022-07-10 DIAGNOSIS — Z8249 Family history of ischemic heart disease and other diseases of the circulatory system: Secondary | ICD-10-CM | POA: Diagnosis not present

## 2022-07-10 DIAGNOSIS — Z794 Long term (current) use of insulin: Secondary | ICD-10-CM | POA: Diagnosis not present

## 2022-07-10 DIAGNOSIS — Z992 Dependence on renal dialysis: Secondary | ICD-10-CM

## 2022-07-10 DIAGNOSIS — J9602 Acute respiratory failure with hypercapnia: Secondary | ICD-10-CM | POA: Diagnosis present

## 2022-07-10 DIAGNOSIS — Z8616 Personal history of COVID-19: Secondary | ICD-10-CM | POA: Diagnosis not present

## 2022-07-10 DIAGNOSIS — J810 Acute pulmonary edema: Secondary | ICD-10-CM

## 2022-07-10 DIAGNOSIS — D631 Anemia in chronic kidney disease: Secondary | ICD-10-CM | POA: Diagnosis present

## 2022-07-10 DIAGNOSIS — J9601 Acute respiratory failure with hypoxia: Secondary | ICD-10-CM | POA: Diagnosis not present

## 2022-07-10 DIAGNOSIS — I161 Hypertensive emergency: Secondary | ICD-10-CM | POA: Diagnosis present

## 2022-07-10 DIAGNOSIS — N186 End stage renal disease: Secondary | ICD-10-CM

## 2022-07-10 DIAGNOSIS — Z955 Presence of coronary angioplasty implant and graft: Secondary | ICD-10-CM

## 2022-07-10 DIAGNOSIS — N189 Chronic kidney disease, unspecified: Secondary | ICD-10-CM

## 2022-07-10 DIAGNOSIS — I428 Other cardiomyopathies: Secondary | ICD-10-CM | POA: Diagnosis present

## 2022-07-10 DIAGNOSIS — E871 Hypo-osmolality and hyponatremia: Secondary | ICD-10-CM | POA: Diagnosis present

## 2022-07-10 DIAGNOSIS — R197 Diarrhea, unspecified: Secondary | ICD-10-CM | POA: Diagnosis not present

## 2022-07-10 DIAGNOSIS — Z86718 Personal history of other venous thrombosis and embolism: Secondary | ICD-10-CM

## 2022-07-10 DIAGNOSIS — R110 Nausea: Secondary | ICD-10-CM | POA: Diagnosis not present

## 2022-07-10 DIAGNOSIS — Z833 Family history of diabetes mellitus: Secondary | ICD-10-CM

## 2022-07-10 DIAGNOSIS — I2510 Atherosclerotic heart disease of native coronary artery without angina pectoris: Secondary | ICD-10-CM | POA: Diagnosis present

## 2022-07-10 DIAGNOSIS — Z7901 Long term (current) use of anticoagulants: Secondary | ICD-10-CM

## 2022-07-10 DIAGNOSIS — I2489 Other forms of acute ischemic heart disease: Secondary | ICD-10-CM | POA: Diagnosis present

## 2022-07-10 DIAGNOSIS — Z1152 Encounter for screening for COVID-19: Secondary | ICD-10-CM

## 2022-07-10 DIAGNOSIS — J9612 Chronic respiratory failure with hypercapnia: Secondary | ICD-10-CM | POA: Diagnosis present

## 2022-07-10 DIAGNOSIS — Z7982 Long term (current) use of aspirin: Secondary | ICD-10-CM

## 2022-07-10 DIAGNOSIS — I5043 Acute on chronic combined systolic (congestive) and diastolic (congestive) heart failure: Secondary | ICD-10-CM | POA: Diagnosis present

## 2022-07-10 DIAGNOSIS — E1100 Type 2 diabetes mellitus with hyperosmolarity without nonketotic hyperglycemic-hyperosmolar coma (NKHHC): Secondary | ICD-10-CM | POA: Diagnosis present

## 2022-07-10 DIAGNOSIS — E1122 Type 2 diabetes mellitus with diabetic chronic kidney disease: Secondary | ICD-10-CM | POA: Diagnosis present

## 2022-07-10 DIAGNOSIS — D72829 Elevated white blood cell count, unspecified: Secondary | ICD-10-CM | POA: Diagnosis present

## 2022-07-10 DIAGNOSIS — Z79899 Other long term (current) drug therapy: Secondary | ICD-10-CM

## 2022-07-10 DIAGNOSIS — N2581 Secondary hyperparathyroidism of renal origin: Secondary | ICD-10-CM | POA: Diagnosis present

## 2022-07-10 DIAGNOSIS — R739 Hyperglycemia, unspecified: Principal | ICD-10-CM

## 2022-07-10 DIAGNOSIS — Z8673 Personal history of transient ischemic attack (TIA), and cerebral infarction without residual deficits: Secondary | ICD-10-CM

## 2022-07-10 DIAGNOSIS — J441 Chronic obstructive pulmonary disease with (acute) exacerbation: Secondary | ICD-10-CM | POA: Diagnosis present

## 2022-07-10 DIAGNOSIS — I214 Non-ST elevation (NSTEMI) myocardial infarction: Secondary | ICD-10-CM | POA: Diagnosis not present

## 2022-07-10 DIAGNOSIS — R7989 Other specified abnormal findings of blood chemistry: Secondary | ICD-10-CM | POA: Diagnosis not present

## 2022-07-10 DIAGNOSIS — Z7902 Long term (current) use of antithrombotics/antiplatelets: Secondary | ICD-10-CM

## 2022-07-10 DIAGNOSIS — I255 Ischemic cardiomyopathy: Secondary | ICD-10-CM

## 2022-07-10 DIAGNOSIS — R079 Chest pain, unspecified: Secondary | ICD-10-CM | POA: Diagnosis not present

## 2022-07-10 DIAGNOSIS — Z862 Personal history of diseases of the blood and blood-forming organs and certain disorders involving the immune mechanism: Secondary | ICD-10-CM

## 2022-07-10 LAB — CBC
HCT: 30.7 % — ABNORMAL LOW (ref 39.0–52.0)
Hemoglobin: 10.2 g/dL — ABNORMAL LOW (ref 13.0–17.0)
MCH: 31.1 pg (ref 26.0–34.0)
MCHC: 33.2 g/dL (ref 30.0–36.0)
MCV: 93.6 fL (ref 80.0–100.0)
Platelets: 284 10*3/uL (ref 150–400)
RBC: 3.28 MIL/uL — ABNORMAL LOW (ref 4.22–5.81)
RDW: 13.6 % (ref 11.5–15.5)
WBC: 13.9 10*3/uL — ABNORMAL HIGH (ref 4.0–10.5)
nRBC: 0 % (ref 0.0–0.2)

## 2022-07-10 LAB — PROTIME-INR
INR: 1.1 (ref 0.8–1.2)
Prothrombin Time: 14.1 seconds (ref 11.4–15.2)

## 2022-07-10 LAB — CBG MONITORING, ED
Glucose-Capillary: 118 mg/dL — ABNORMAL HIGH (ref 70–99)
Glucose-Capillary: 146 mg/dL — ABNORMAL HIGH (ref 70–99)
Glucose-Capillary: 175 mg/dL — ABNORMAL HIGH (ref 70–99)
Glucose-Capillary: 203 mg/dL — ABNORMAL HIGH (ref 70–99)
Glucose-Capillary: 246 mg/dL — ABNORMAL HIGH (ref 70–99)
Glucose-Capillary: 341 mg/dL — ABNORMAL HIGH (ref 70–99)
Glucose-Capillary: 395 mg/dL — ABNORMAL HIGH (ref 70–99)
Glucose-Capillary: >600 mg/dL (ref 70–99)
Glucose-Capillary: >600 mg/dL (ref 70–99)
Glucose-Capillary: >600 mg/dL (ref 70–99)

## 2022-07-10 LAB — HEPATIC FUNCTION PANEL
ALT: 27 U/L (ref 0–44)
AST: 39 U/L (ref 15–41)
Albumin: 3.6 g/dL (ref 3.5–5.0)
Alkaline Phosphatase: 101 U/L (ref 38–126)
Bilirubin, Direct: <0.1 mg/dL (ref 0.0–0.2)
Total Bilirubin: 0.8 mg/dL (ref 0.3–1.2)
Total Protein: 7.1 g/dL (ref 6.5–8.1)

## 2022-07-10 LAB — BLOOD GAS, VENOUS
Acid-base deficit: 4.5 mmol/L — ABNORMAL HIGH (ref 0.0–2.0)
Bicarbonate: 23.6 mmol/L (ref 20.0–28.0)
O2 Saturation: 79.3 %
Patient temperature: 37
pCO2, Ven: 55 mmHg (ref 44–60)
pH, Ven: 7.24 — ABNORMAL LOW (ref 7.25–7.43)
pO2, Ven: 51 mmHg — ABNORMAL HIGH (ref 32–45)

## 2022-07-10 LAB — BLOOD GAS, ARTERIAL
Acid-base deficit: 3.6 mmol/L — ABNORMAL HIGH (ref 0.0–2.0)
Bicarbonate: 24.2 mmol/L (ref 20.0–28.0)
O2 Saturation: 98.8 %
Patient temperature: 37
pCO2 arterial: 54 mmHg — ABNORMAL HIGH (ref 32–48)
pH, Arterial: 7.26 — ABNORMAL LOW (ref 7.35–7.45)
pO2, Arterial: 109 mmHg — ABNORMAL HIGH (ref 83–108)

## 2022-07-10 LAB — BASIC METABOLIC PANEL
Anion gap: 13 (ref 5–15)
Anion gap: 11 (ref 5–15)
BUN: 58 mg/dL — ABNORMAL HIGH (ref 6–20)
BUN: 26 mg/dL — ABNORMAL HIGH (ref 6–20)
CO2: 21 mmol/L — ABNORMAL LOW (ref 22–32)
CO2: 25 mmol/L (ref 22–32)
Calcium: 9 mg/dL (ref 8.9–10.3)
Calcium: 8.2 mg/dL — ABNORMAL LOW (ref 8.9–10.3)
Chloride: 95 mmol/L — ABNORMAL LOW (ref 98–111)
Chloride: 94 mmol/L — ABNORMAL LOW (ref 98–111)
Creatinine, Ser: 5.29 mg/dL — ABNORMAL HIGH (ref 0.61–1.24)
Creatinine, Ser: 2.8 mg/dL — ABNORMAL HIGH (ref 0.61–1.24)
GFR, Estimated: 12 mL/min — ABNORMAL LOW (ref >60)
GFR, Estimated: 25 mL/min — ABNORMAL LOW (ref >60)
Glucose, Bld: 972 mg/dL (ref 70–99)
Glucose, Bld: 284 mg/dL — ABNORMAL HIGH (ref 70–99)
Potassium: 5.1 mmol/L (ref 3.5–5.1)
Potassium: 3.1 mmol/L — ABNORMAL LOW (ref 3.5–5.1)
Sodium: 129 mmol/L — ABNORMAL LOW (ref 135–145)
Sodium: 130 mmol/L — ABNORMAL LOW (ref 135–145)

## 2022-07-10 LAB — RESP PANEL BY RT-PCR (RSV, FLU A&B, COVID)  RVPGX2
Influenza A by PCR: NEGATIVE
Influenza B by PCR: NEGATIVE
Resp Syncytial Virus by PCR: NEGATIVE
SARS Coronavirus 2 by RT PCR: NEGATIVE

## 2022-07-10 LAB — BRAIN NATRIURETIC PEPTIDE: B Natriuretic Peptide: >4500 pg/mL — ABNORMAL HIGH (ref 0.0–100.0)

## 2022-07-10 LAB — TROPONIN I (HIGH SENSITIVITY)
Troponin I (High Sensitivity): 610 ng/L (ref <18)
Troponin I (High Sensitivity): 644 ng/L (ref <18)
Troponin I (High Sensitivity): 1723 ng/L (ref <18)
Troponin I (High Sensitivity): 6193 ng/L (ref <18)

## 2022-07-10 LAB — APTT: aPTT: 23 seconds — ABNORMAL LOW (ref 24–36)

## 2022-07-10 LAB — HEPARIN LEVEL (UNFRACTIONATED): Heparin Unfractionated: <0.1 IU/mL — ABNORMAL LOW (ref 0.30–0.70)

## 2022-07-10 LAB — HEPATITIS B SURFACE ANTIGEN: Hepatitis B Surface Ag: NONREACTIVE

## 2022-07-10 MED ORDER — CARVEDILOL 25 MG PO TABS
25.0000 mg | ORAL_TABLET | Freq: Two times a day (BID) | ORAL | Status: DC
  Administered 2022-07-10 – 2022-07-15 (×10): 25 mg via ORAL
  Filled 2022-07-10: qty 4
  Filled 2022-07-10 (×7): qty 1
  Filled 2022-07-10: qty 4
  Filled 2022-07-10: qty 1

## 2022-07-10 MED ORDER — ONDANSETRON HCL 4 MG/2ML IJ SOLN
4.0000 mg | Freq: Four times a day (QID) | INTRAMUSCULAR | Status: DC | PRN
  Administered 2022-07-14 – 2022-07-15 (×2): 4 mg via INTRAVENOUS
  Filled 2022-07-10 (×2): qty 2

## 2022-07-10 MED ORDER — DOXYCYCLINE HYCLATE 100 MG PO TABS
100.0000 mg | ORAL_TABLET | Freq: Two times a day (BID) | ORAL | Status: DC
  Administered 2022-07-10 – 2022-07-12 (×5): 100 mg via ORAL
  Filled 2022-07-10 (×5): qty 1

## 2022-07-10 MED ORDER — NEPRO/CARBSTEADY PO LIQD
237.0000 mL | Freq: Three times a day (TID) | ORAL | Status: DC
  Administered 2022-07-10 – 2022-07-14 (×5): 237 mL via ORAL

## 2022-07-10 MED ORDER — INSULIN ASPART 100 UNIT/ML IJ SOLN
0.0000 [IU] | Freq: Three times a day (TID) | INTRAMUSCULAR | Status: DC
  Administered 2022-07-10: 4 [IU] via SUBCUTANEOUS
  Administered 2022-07-10 – 2022-07-11 (×2): 2 [IU] via SUBCUTANEOUS
  Administered 2022-07-12 (×2): 3 [IU] via SUBCUTANEOUS
  Administered 2022-07-12: 1 [IU] via SUBCUTANEOUS
  Administered 2022-07-13: 2 [IU] via SUBCUTANEOUS
  Administered 2022-07-14: 1 [IU] via SUBCUTANEOUS
  Administered 2022-07-14: 2 [IU] via SUBCUTANEOUS
  Administered 2022-07-15: 1 [IU] via SUBCUTANEOUS
  Filled 2022-07-10 (×10): qty 1

## 2022-07-10 MED ORDER — ALBUTEROL SULFATE (2.5 MG/3ML) 0.083% IN NEBU: 2.5000 mg | INHALATION_SOLUTION | RESPIRATORY_TRACT | Status: DC | PRN

## 2022-07-10 MED ORDER — DEXTROSE 50 % IV SOLN
0.0000 mL | INTRAVENOUS | Status: DC | PRN
  Filled 2022-07-10: qty 50

## 2022-07-10 MED ORDER — IPRATROPIUM-ALBUTEROL 0.5-2.5 (3) MG/3ML IN SOLN
3.0000 mL | Freq: Once | RESPIRATORY_TRACT | Status: DC
  Filled 2022-07-10: qty 3

## 2022-07-10 MED ORDER — LOSARTAN POTASSIUM 50 MG PO TABS
100.0000 mg | ORAL_TABLET | Freq: Every day | ORAL | Status: DC
  Administered 2022-07-10 – 2022-07-15 (×5): 100 mg via ORAL
  Filled 2022-07-10 (×5): qty 2

## 2022-07-10 MED ORDER — LOSARTAN POTASSIUM 50 MG PO TABS: 100.0000 mg | ORAL_TABLET | Freq: Every day | ORAL | Status: DC

## 2022-07-10 MED ORDER — SODIUM CHLORIDE 0.9% FLUSH: 3.0000 mL | INTRAVENOUS | Status: DC | PRN

## 2022-07-10 MED ORDER — NITROGLYCERIN 0.4 MG SL SUBL: 0.4000 mg | SUBLINGUAL_TABLET | SUBLINGUAL | Status: DC | PRN

## 2022-07-10 MED ORDER — HEPARIN SODIUM (PORCINE) 1000 UNIT/ML DIALYSIS
20.0000 [IU]/kg | INTRAMUSCULAR | Status: DC | PRN
  Administered 2022-07-10: 1400 [IU] via INTRAVENOUS_CENTRAL

## 2022-07-10 MED ORDER — HEPARIN BOLUS VIA INFUSION
4000.0000 [IU] | Freq: Once | INTRAVENOUS | Status: AC
  Administered 2022-07-10: 4000 [IU] via INTRAVENOUS
  Filled 2022-07-10: qty 4000

## 2022-07-10 MED ORDER — CHLORHEXIDINE GLUCONATE CLOTH 2 % EX PADS
6.0000 | MEDICATED_PAD | Freq: Every day | CUTANEOUS | Status: DC
  Administered 2022-07-13 – 2022-07-15 (×3): 6 via TOPICAL
  Filled 2022-07-10 (×4): qty 6

## 2022-07-10 MED ORDER — TORSEMIDE 20 MG PO TABS
20.0000 mg | ORAL_TABLET | ORAL | Status: DC
  Administered 2022-07-15: 20 mg via ORAL
  Filled 2022-07-10: qty 1

## 2022-07-10 MED ORDER — ONDANSETRON HCL 4 MG PO TABS: 4.0000 mg | ORAL_TABLET | Freq: Four times a day (QID) | ORAL | Status: DC | PRN

## 2022-07-10 MED ORDER — ISOSORBIDE MONONITRATE ER 60 MG PO TB24
60.0000 mg | ORAL_TABLET | Freq: Two times a day (BID) | ORAL | Status: DC
  Administered 2022-07-10 – 2022-07-15 (×9): 60 mg via ORAL
  Filled 2022-07-10 (×9): qty 1

## 2022-07-10 MED ORDER — INSULIN REGULAR(HUMAN) IN NACL 100-0.9 UT/100ML-% IV SOLN
INTRAVENOUS | Status: DC
  Administered 2022-07-10: 11 [IU]/h via INTRAVENOUS
  Filled 2022-07-10: qty 100

## 2022-07-10 MED ORDER — HYDRALAZINE HCL 100 MG PO TABS: 100.0000 mg | ORAL_TABLET | Freq: Two times a day (BID) | ORAL | Status: DC

## 2022-07-10 MED ORDER — CLOPIDOGREL BISULFATE 75 MG PO TABS
75.0000 mg | ORAL_TABLET | Freq: Every day | ORAL | Status: DC
  Administered 2022-07-10 – 2022-07-15 (×6): 75 mg via ORAL
  Filled 2022-07-10 (×6): qty 1

## 2022-07-10 MED ORDER — SODIUM CHLORIDE 0.9 % IV SOLN: 250.0000 mL | INTRAVENOUS | Status: DC | PRN

## 2022-07-10 MED ORDER — SODIUM CHLORIDE 0.9% FLUSH
3.0000 mL | Freq: Two times a day (BID) | INTRAVENOUS | Status: DC
  Administered 2022-07-11 – 2022-07-12 (×2): 3 mL via INTRAVENOUS

## 2022-07-10 MED ORDER — HYDRALAZINE HCL 50 MG PO TABS
100.0000 mg | ORAL_TABLET | Freq: Two times a day (BID) | ORAL | Status: DC
  Administered 2022-07-10 – 2022-07-11 (×4): 100 mg via ORAL
  Filled 2022-07-10 (×4): qty 2

## 2022-07-10 MED ORDER — IPRATROPIUM-ALBUTEROL 0.5-2.5 (3) MG/3ML IN SOLN
3.0000 mL | Freq: Four times a day (QID) | RESPIRATORY_TRACT | Status: AC
  Administered 2022-07-10 (×2): 3 mL via RESPIRATORY_TRACT
  Filled 2022-07-10: qty 3

## 2022-07-10 MED ORDER — ISOSORBIDE MONONITRATE ER 60 MG PO TB24: 30.0000 mg | ORAL_TABLET | Freq: Two times a day (BID) | ORAL | Status: DC

## 2022-07-10 MED ORDER — ROSUVASTATIN CALCIUM 10 MG PO TABS
40.0000 mg | ORAL_TABLET | Freq: Every day | ORAL | Status: DC
  Administered 2022-07-11 – 2022-07-12 (×2): 40 mg via ORAL
  Filled 2022-07-10 (×3): qty 2

## 2022-07-10 MED ORDER — ESCITALOPRAM OXALATE 10 MG PO TABS
10.0000 mg | ORAL_TABLET | Freq: Every day | ORAL | Status: DC
  Administered 2022-07-10 – 2022-07-14 (×5): 10 mg via ORAL
  Filled 2022-07-10 (×5): qty 1

## 2022-07-10 MED ORDER — LORAZEPAM 2 MG/ML IJ SOLN
INTRAMUSCULAR | Status: AC
  Filled 2022-07-10: qty 1

## 2022-07-10 MED ORDER — HEPARIN SODIUM (PORCINE) 5000 UNIT/ML IJ SOLN: 5000.0000 [IU] | Freq: Three times a day (TID) | INTRAMUSCULAR | Status: DC

## 2022-07-10 MED ORDER — HEPARIN (PORCINE) 25000 UT/250ML-% IV SOLN
1100.0000 [IU]/h | INTRAVENOUS | Status: DC
  Administered 2022-07-10: 850 [IU]/h via INTRAVENOUS
  Filled 2022-07-10 (×3): qty 250

## 2022-07-10 MED ORDER — NITROGLYCERIN IN D5W 200-5 MCG/ML-% IV SOLN
0.0000 ug/min | INTRAVENOUS | Status: DC
  Administered 2022-07-10: 5 ug/min via INTRAVENOUS
  Filled 2022-07-10: qty 250

## 2022-07-10 MED ORDER — ONDANSETRON HCL 4 MG/2ML IJ SOLN
4.0000 mg | Freq: Once | INTRAMUSCULAR | Status: AC
  Administered 2022-07-10: 4 mg via INTRAVENOUS
  Filled 2022-07-10: qty 2

## 2022-07-10 MED ORDER — HEPARIN SODIUM (PORCINE) 1000 UNIT/ML IJ SOLN
INTRAMUSCULAR | Status: AC
  Filled 2022-07-10: qty 10

## 2022-07-10 MED ORDER — ASPIRIN 81 MG PO CHEW
324.0000 mg | CHEWABLE_TABLET | Freq: Once | ORAL | Status: AC
  Administered 2022-07-10: 324 mg via ORAL
  Filled 2022-07-10: qty 4

## 2022-07-10 MED ORDER — PANTOPRAZOLE SODIUM 20 MG PO TBEC
20.0000 mg | DELAYED_RELEASE_TABLET | Freq: Every day | ORAL | Status: DC
  Administered 2022-07-11 – 2022-07-15 (×4): 20 mg via ORAL
  Filled 2022-07-10 (×5): qty 1

## 2022-07-10 MED ORDER — ASPIRIN 81 MG PO TBEC
81.0000 mg | DELAYED_RELEASE_TABLET | Freq: Every day | ORAL | Status: DC
  Administered 2022-07-11 – 2022-07-15 (×5): 81 mg via ORAL
  Filled 2022-07-10 (×5): qty 1

## 2022-07-10 MED ORDER — DEXTROSE IN LACTATED RINGERS 5 % IV SOLN: INTRAVENOUS | Status: DC

## 2022-07-10 MED ORDER — LORAZEPAM 2 MG/ML IJ SOLN
0.5000 mg | INTRAMUSCULAR | Status: AC
  Administered 2022-07-10: 0.5 mg via INTRAVENOUS

## 2022-07-10 MED ORDER — CARVEDILOL 25 MG PO TABS: 25.0000 mg | ORAL_TABLET | Freq: Two times a day (BID) | ORAL | Status: DC

## 2022-07-10 MED ORDER — INSULIN DETEMIR 100 UNIT/ML ~~LOC~~ SOLN
15.0000 [IU] | Freq: Every day | SUBCUTANEOUS | Status: DC
  Administered 2022-07-10 – 2022-07-13 (×2): 15 [IU] via SUBCUTANEOUS
  Filled 2022-07-10 (×4): qty 0.15

## 2022-07-10 MED ORDER — MIDODRINE HCL 5 MG PO TABS
10.0000 mg | ORAL_TABLET | Freq: Once | ORAL | Status: AC
  Administered 2022-07-10: 10 mg via ORAL
  Filled 2022-07-10: qty 2

## 2022-07-10 MED ORDER — HALOPERIDOL LACTATE 5 MG/ML IJ SOLN
2.0000 mg | Freq: Once | INTRAMUSCULAR | Status: AC
  Administered 2022-07-10: 2 mg via INTRAVENOUS
  Filled 2022-07-10: qty 1

## 2022-07-10 MED ORDER — FUROSEMIDE 10 MG/ML IJ SOLN
80.0000 mg | Freq: Once | INTRAMUSCULAR | Status: AC
  Administered 2022-07-10: 80 mg via INTRAVENOUS
  Filled 2022-07-10: qty 8

## 2022-07-10 MED ORDER — DOXYCYCLINE HYCLATE 100 MG PO CAPS: 100.0000 mg | ORAL_CAPSULE | Freq: Two times a day (BID) | ORAL | Status: DC

## 2022-07-10 MED ORDER — ACETAMINOPHEN 325 MG PO TABS
650.0000 mg | ORAL_TABLET | Freq: Four times a day (QID) | ORAL | Status: DC | PRN
  Filled 2022-07-10: qty 2

## 2022-07-10 NOTE — Progress Notes (Signed)
[facility]  ROUNDING NOTE   Subjective:   Jimmy Olson  is a 62 year old Hispanic male with past medical conditions including anemia, two-vessel CAD, diabetes, depression, DVT with Eliquis, and end-stage renal disease on hemodialysis.  Patient presents to the emergency department with complaints of shortness of breath and chest pain. Patient has been admitted for Acute hypoxic respiratory failure ([place]) [J96.01]  Patient is known to our practice and receives outpatient dialysis treatments at [facility] on a TTS schedule, supervised by Dr. Candiss Norse.  Last treatment completed on Saturday.  Patient seen and evaluated in emergency department during dialysis.   HEMODIALYSIS FLOWSHEET:  Blood Flow Rate (mL/min): 350 mL/min Arterial Pressure (mmHg): -150 mmHg Venous Pressure (mmHg): 160 mmHg TMP (mmHg): 12 mmHg Ultrafiltration Rate (mL/min): 214 mL/min Dialysate Flow Rate (mL/min): 300 ml/min Dialysis Fluid Bolus: Normal Saline Bolus Amount (mL): 100 mL  Patient remains somnolent.  Chart review states wife escorted patient to emergency department overnight stating shortness of breath began previous that night around 10 PM.  Associating symptoms of chest pain, nausea and diaphoresis also reported.  Patient received full dialysis treatment on Saturday with UF 1.5 L removed.  Denies any missed treatments, confirmed by outpatient records.  Patient recently seen 2 days prior in emergency department for shortness of breath and was diagnosed with acute bronchitis at that time.  Received prednisone prior to discharge.  Denies alleviating from prescribed diet and fluid restriction.  No lower extremity edema.  Currently on 8 L nasal cannula high flow.  Labs on ED arrival include sodium 133, potassium 4.9, glucose 423, BUN 39, creatinine 4.44 with GFR 14, albumin 3.4 and hemoglobin 9.0.  Respiratory panel negative for influenza, COVID-19, and RSV.  Chest x-ray shows central pulmonary  vascular congestion with mild pulmonary edema.  Troponin 610 with elevated BNP greater than 4500.  Troponin max at 1700.  We have been consulted to provide dialysis.   Objective:  Vital signs in last 24 hours:  Temp:  [98.1 F (36.7 C)] 98.1 F (36.7 C) (01/22 0730) Pulse Rate:  [74-123] 85 (01/22 1100) Resp:  [17-30] 19 (01/22 1100) BP: (84-236)/(58-141) 140/107 (01/22 1100) SpO2:  [82 %-100 %] 100 % (01/22 1100) FiO2 (%):  [45 %] 45 % (01/22 0442) Weight:  [69.6 kg] 69.6 kg (01/22 0409)  Weight change:  Filed Weights   07/10/22 0409  Weight: 69.6 kg    Intake/Output: No intake/output data recorded.   Intake/Output this shift:  Total I/O In: 193.7 [I.V.:193.7] Out: -   Physical Exam: General: NAD, resting quietly  Head: Normocephalic, atraumatic. Moist oral mucosal membranes  Eyes: Anicteric  Lungs:  Basilar crackles, normal effort, NCO2  Heart: Regular rate and rhythm  Abdomen:  Soft, nontender, nondistended  Extremities: No peripheral edema.  Neurologic: Somnolent, moving all four extremities  Skin: No lesions  Access: Right IJ PermCath    Basic Metabolic Panel: Recent Labs  Lab 07/07/22 2152 07/10/22 0404  NA 133* 129*  K 4.9 5.1  CL 101 95*  CO2 23 21*  GLUCOSE 423* 972*  BUN 39* 58*  CREATININE 4.44* 5.29*  CALCIUM 8.9 9.0    Liver Function Tests: Recent Labs  Lab 07/07/22 2152 07/10/22 0615  AST 32 39  ALT 16 27  ALKPHOS 97 101  BILITOT 1.1 0.8  PROT 7.2 7.1  ALBUMIN 3.4* 3.6   No results for input(s): "LIPASE", "AMYLASE" in the last 168 hours. No results for input(s): "AMMONIA" in the last 168 hours.  CBC: Recent Labs  Lab 07/07/22 2152 07/10/22 0404  WBC 5.6 13.9*  NEUTROABS 3.7  --   HGB 9.0* 10.2*  HCT 26.5* 30.7*  MCV 89.5 93.6  PLT 210 284    Cardiac Enzymes: No results for input(s): "CKTOTAL", "CKMB", "CKMBINDEX", "TROPONINI" in the last 168 hours.  BNP: Invalid input(s): "POCBNP"  CBG: Recent Labs  Lab  07/10/22 0624 07/10/22 0708 07/10/22 0758 07/10/22 0901 07/10/22 0952  GLUCAP >600* >600* 395* 175* 118*    Microbiology: Results for orders placed or performed during the hospital encounter of 07/10/22  Resp panel by RT-PCR (RSV, Flu A&B, Covid) Anterior Nasal Swab     Status: None   Collection Time: 07/10/22  6:49 AM   Specimen: Anterior Nasal Swab  Result Value Ref Range Status   SARS Coronavirus 2 by RT PCR NEGATIVE NEGATIVE Final    Comment: (NOTE) SARS-CoV-2 target nucleic acids are NOT DETECTED.  The SARS-CoV-2 RNA is generally detectable in upper respiratory specimens during the acute phase of infection. The lowest concentration of SARS-CoV-2 viral copies this assay can detect is 138 copies/mL. A negative result does not preclude SARS-Cov-2 infection and should not be used as the sole basis for treatment or other patient management decisions. A negative result may occur with  improper specimen collection/handling, submission of specimen other than nasopharyngeal swab, presence of viral mutation(s) within the areas targeted by this assay, and inadequate number of viral copies(<138 copies/mL). A negative result must be combined with clinical observations, patient history, and epidemiological information. The expected result is Negative.  Fact Sheet for Patients:  EntrepreneurPulse.com.au  Fact Sheet for Healthcare Providers:  IncredibleEmployment.be  This test is no t yet approved or cleared by the [place] FDA and  has been authorized for detection and/or diagnosis of SARS-CoV-2 by FDA under an Emergency Use Authorization (EUA). This EUA will remain  in effect (meaning this test can be used) for the duration of the COVID-19 declaration under Section 564(b)(1) of the Act, 21 U.S.C.section 360bbb-3(b)(1), unless the authorization is terminated  or revoked sooner.       Influenza A by PCR NEGATIVE NEGATIVE Final   Influenza  B by PCR NEGATIVE NEGATIVE Final    Comment: (NOTE) The Xpert Xpress SARS-CoV-2/FLU/RSV plus assay is intended as an aid in the diagnosis of influenza from Nasopharyngeal swab specimens and should not be used as a sole basis for treatment. Nasal washings and aspirates are unacceptable for Xpert Xpress SARS-CoV-2/FLU/RSV testing.  Fact Sheet for Patients: EntrepreneurPulse.com.au  Fact Sheet for Healthcare Providers: IncredibleEmployment.be  This test is not yet approved or cleared by the [place] FDA and has been authorized for detection and/or diagnosis of SARS-CoV-2 by FDA under an Emergency Use Authorization (EUA). This EUA will remain in effect (meaning this test can be used) for the duration of the COVID-19 declaration under Section 564(b)(1) of the Act, 21 U.S.C. section 360bbb-3(b)(1), unless the authorization is terminated or revoked.     Resp Syncytial Virus by PCR NEGATIVE NEGATIVE Final    Comment: (NOTE) Fact Sheet for Patients: EntrepreneurPulse.com.au  Fact Sheet for Healthcare Providers: IncredibleEmployment.be  This test is not yet approved or cleared by the [place] FDA and has been authorized for detection and/or diagnosis of SARS-CoV-2 by FDA under an Emergency Use Authorization (EUA). This EUA will remain in effect (meaning this test can be used) for the duration of the COVID-19 declaration under Section 564(b)(1) of the Act, 21 U.S.C. section 360bbb-3(b)(1), unless the authorization is  terminated or revoked.  Performed at [facility], [address] [place], [place] [address]     Coagulation Studies: No results for input(s): "LABPROT", "INR" in the last 72 hours.  Urinalysis: No results for input(s): "COLORURINE", "LABSPEC", "PHURINE", "GLUCOSEU", "HGBUR", "BILIRUBINUR", "KETONESUR", "PROTEINUR", "UROBILINOGEN", "NITRITE", "LEUKOCYTESUR" in the last 72  hours.  Invalid input(s): "APPERANCEUR"    Imaging: CT HEAD WO CONTRAST (5MM)  Result Date: 07/10/2022 CLINICAL DATA:  Chest pain and shortness of breath. EXAM: CT HEAD WITHOUT CONTRAST TECHNIQUE: Contiguous axial images were obtained from the base of the skull through the vertex without intravenous contrast. RADIATION DOSE REDUCTION: This exam was performed according to the departmental dose-optimization program which includes automated exposure control, adjustment of the mA and/or kV according to patient size and/or use of iterative reconstruction technique. COMPARISON:  04/04/2022 FINDINGS: Brain: No evidence of acute infarction, hemorrhage, hydrocephalus, extra-axial collection or mass lesion/mass effect. Vascular: No hyperdense vessel or unexpected calcification. Skull: Normal. Negative for fracture or focal lesion. Sinuses/Orbits: No acute finding. Other: Generalized fairly mild motion artifact IMPRESSION: Negative motion degraded head CT. Electronically Signed   By: Jorje Guild M.D.   On: 07/10/2022 06:43   DG Chest Port 1 View  Result Date: 07/10/2022 CLINICAL DATA:  Chest pain EXAM: PORTABLE CHEST 1 VIEW COMPARISON:  Three days ago FINDINGS: Worsening interstitial opacity with Kerley lines and fissure thickening. Chronic cardiopericardial enlargement with stable mediastinal contours including hilar vessel congestion. Dialysis catheter on the right in good position. No pneumothorax. IMPRESSION: Worsening pulmonary edema. Electronically Signed   By: Jorje Guild M.D.   On: 07/10/2022 04:44     Medications:    sodium chloride     dextrose 5% lactated ringers 125 mL/hr at 07/10/22 1006    [START ON 07/11/2022] aspirin EC  81 mg Oral Daily   carvedilol  25 mg Oral BID   Chlorhexidine Gluconate Cloth  6 each Topical Q0600   clopidogrel  75 mg Oral Daily   doxycycline  100 mg Oral BID   escitalopram  10 mg Oral QHS   feeding supplement (NEPRO CARB STEADY)  237 mL Oral TID BM    heparin  5,000 Units Subcutaneous Q8H   hydrALAZINE  100 mg Oral BID   insulin aspart  0-6 Units Subcutaneous TID WC   insulin detemir  15 Units Subcutaneous QHS   ipratropium-albuterol  3 mL Nebulization Q6H   isosorbide mononitrate  30 mg Oral BID   [START ON 07/11/2022] losartan  100 mg Oral Daily   pantoprazole  20 mg Oral Daily   rosuvastatin  40 mg Oral Daily   sodium chloride flush  3 mL Intravenous Q12H   [START ON 07/11/2022] torsemide  20 mg Oral Daily   sodium chloride, acetaminophen, albuterol, dextrose, heparin, nitroGLYCERIN, ondansetron **OR** ondansetron (ZOFRAN) IV, sodium chloride flush  Assessment/ Plan:  Jimmy Olson is a 62 y.o.  male with past medical conditions including anemia, two-vessel CAD, diabetes, depression, DVT with Eliquis, and end-stage renal disease on hemodialysis.  Patient presents to the emergency department with complaints of shortness of breath and chest pain. Patient has been admitted for Acute hypoxic respiratory failure ([place]) [J96.01]  [place]/TTS/right chest PermCath  End-stage renal disease on hemodialysis.  Last treatment completed on Saturday. Chest xray shows vascular congestion with pulmonary edema. Will provide dialysis treatment today, UF goal 2 to 2.5 L.  UF goal reduced to 1.5 L due to hypotension. Ordered Midodrine 10mg  during dialysis. Next  treatment scheduled for Tuesday to maintain outpatient schedule.  2. Anemia of chronic kidney disease Lab Results  Component Value Date   HGB 10.2 (L) 07/10/2022    Hemoglobin currently within desired range.  Will continue to monitor.  3. Secondary Hyperparathyroidism: with outpatient labs: PTH 387, phosphorus 5.1, calcium 9.1 on 06/29/22.   Lab Results  Component Value Date   CALCIUM 9.0 07/10/2022   CAION 1.19 05/26/2022   PHOS 6.4 (H) 05/27/2022    Phosphorus elevated at this time. Will continue to monitor.   4. Diabetes mellitus type II with chronic kidney  disease/renal manifestations: insulin dependent. Home regimen includes NovoLog, Humulog, Levemir. Most recent hemoglobin A1c is 10.1 on 05/17/22      LOS: 0   1/22/202411:12 AM

## 2022-07-10 NOTE — Assessment & Plan Note (Addendum)
Dialysis days are T/TH/S Patient admitted to the hospital in respiratory distress and found to have pulmonary edema and received dialysis on 1/22 and 1/23.  Appreciate nephrology help

## 2022-07-10 NOTE — ED Notes (Signed)
Pt self removed IV from R arm

## 2022-07-10 NOTE — Assessment & Plan Note (Addendum)
History of coronary artery disease status post recent left heart cath with stent angioplasty.  Noted to have elevated troponin levels which may be secondary to demand ischemia from hypertensive emergency and acute CHF.  See below

## 2022-07-10 NOTE — Progress Notes (Signed)
Breeze, NP rounding. She ordered to give midodrine 10mg  once.

## 2022-07-10 NOTE — Assessment & Plan Note (Addendum)
Initially requiring high flow nasal cannula since he did not tolerate BiPAP.  With diuresis and volume removal, has much improved and almost fully resolved.  Breathing comfortably.

## 2022-07-10 NOTE — Consult Note (Signed)
ANTICOAGULATION CONSULT NOTE  Pharmacy Consult for heparin drip  Indication: ACS/STEMI  No Known Allergies  Patient Measurements: Height: 5\' 6"  (167.6 cm) Weight: 69.6 kg (153 lb 8.1 oz) IBW/kg (Calculated) : 63.8 Heparin Dosing Weight: 69.6 kg  Vital Signs: Temp: 98.1 F (36.7 C) (01/22 1135) Temp Source: Axillary (01/22 1135) BP: 157/92 (01/22 1430) Pulse Rate: 93 (01/22 1430)  Labs: Recent Labs    07/07/22 2152 07/10/22 0404 07/10/22 0404 07/10/22 0615 07/10/22 0857 07/10/22 1449  HGB 9.0* 10.2*  --   --   --   --   HCT 26.5* 30.7*  --   --   --   --   PLT 210 284  --   --   --   --   CREATININE 4.44* 5.29*  --   --   --  2.80*  TROPONINIHS  --  610*   < > 644* 1,723* 6,193*   < > = values in this interval not displayed.    Estimated Creatinine Clearance: 25.6 mL/min (A) (by C-G formula based on SCr of 2.8 mg/dL (H)).   Medical History: Past Medical History:  Diagnosis Date   Anemia    Anxiety and depression    ATN (acute tubular necrosis) (HCC)    CAD (coronary artery disease)    a. 06/2020 Cath: Nonobs dzs; b. 05/2021 NSTEMI/Cath: Nonobs dzs->demand isch; c. 05/2022 Cath: LM mild dzs, LAD 40ost/p, 54m, D1 70, RI mild dzs, LCX small, mild dzs, RCA 20p, 62m/d, RPDA 40; d. 05/2022 Staged PCI: Orbital atherectomy of mLAD  (2.75x48 Synergy XD DES).   Chronic HFrEF (heart failure with reduced ejection fraction) ([place])    a. 08/2019 Echo: EF 45-50%; b. 06/2020 Echo: EF 30-35%; c. 04/2022 Echo: EF 40-45%, mild LVH, small apical region of noncompaction, GrII DD, nl RV fxn.   DVT of axillary vein, acute left ([place]) 05/2021   a.) Tx'd with apixaban   ESRD (end stage renal disease) on dialysis [facility])    a.) M-W-F   History of 2019 novel coronavirus disease (COVID-19) 03/06/2020   a.) s/p Tx with monoclonal Ab infusion   HLD (hyperlipidemia)    Hyperkalemia 12/23/2020   Hypertension    Left carotid artery stenosis 08/18/2021   a.) Carotid Doppler 44/08/4740: 59-56% LICA  stenosis   Long term current use of anticoagulant    a.) apixaban for DVT   Mixed Ischemic & Nonischemic Cardiomyopathy    a. 08/2019 Echo: EF 45-50%; b. 06/2020 Echo: EF 30-35%; c. 04/2022 Echo: EF 40-45%, mild LVH, small apical region of noncompaction, GrII DD, nl RV fxn.   NSTEMI (non-ST elevated myocardial infarction) ([place]) 07/15/2020   Sepsis ([place])    Suicidal ideations    TIA (transient ischemic attack) 06/2021   Type 2 diabetes mellitus treated with insulin (HCC)     Medications:  Patient on Eliquis in the past for upper extremity DVT.  Most recent clinic notes indicate that Eliquis was stopped.  Assessment: 62 yo male presented to the ED with complaint of shortness of breath.  Patient has history of CAD.  Found to have elevated troponin in the ED.   Baseline: aPTT and PT/INR pending  Goal of Therapy:  Heparin level 0.3-0.7 units/ml Monitor platelets by anticoagulation protocol: Yes   Plan:  Give 4000 units bolus x 1 Start heparin infusion at 850 units/hr Check anti-Xa level in 8 hours and daily while on heparin Continue to monitor H&H and platelets  Lorin Picket, PharmD 07/10/2022,4:00 PM

## 2022-07-10 NOTE — Assessment & Plan Note (Addendum)
Patient with hypertensive emergency and complications of flash pulmonary edema Started on a nitroglycerin drip with improvement in his blood pressure and this has since been discontinued.  Will resume patient's multiple antihypertensive medications

## 2022-07-10 NOTE — Progress Notes (Signed)
Midodrine 10mg  PO administered.

## 2022-07-10 NOTE — ED Provider Notes (Addendum)
[facility] Provider Note    Event Date/Time   First MD Initiated Contact with Patient 07/10/22 0423     (approximate)   History   Chest Pain and Shortness of Breath   HPI  Jimmy Olson is a 62 y.o. male with past medical history of end-stage renal disease dialysis Tuesday Thursday Saturday, hypertension hyperlipidemia coronary disease who presents with shortness of breath.  On arrival to ED patient is in respiratory distress coughing and yelling making history taking difficult.  Does endorse shortness of breath denies pain.  Patient was seen in the ED 2 days ago for shortness of breath.  Was given nebs steroids and treated for bronchitis.  X-ray at that showed mild pulmonary edema.  Viral testing was negative.      Past Medical History:  Diagnosis Date   Anemia    Anxiety and depression    ATN (acute tubular necrosis) (HCC)    CAD (coronary artery disease)    a.) R/LHC 07/19/2020: 20% pRCA, 30% mRCA, 50% dRCA, 50% RPAV, 40% pLAD, 70% D1, 60% mLAD; LVEDP 19 mmHg; PA 37/16 (26 mmHg); PCWP 13 mmHg. b.) LHC 06/03/2021: EF 45-50%; LVEDP 18 mmHg; 40% pLAD, 60% mLAD, 20% pRCA, 30% mRCA, 70% D1, 30% dRCA, 40% RPDA.   Chronic HFrEF (heart failure with reduced ejection fraction) ([place])    a.) 06/2020 Echo: EF 30-35%, glob HK, GrII DD, nl RV fxn. Mild LAE. b.)  TTE 08/08/2021: EF 40-45%; global HK; mild LVH; GLS -8.6; mild LA dilation.   DVT of axillary vein, acute left ([place]) 05/2021   a.) Tx'd with apixaban   ESRD (end stage renal disease) on dialysis [facility])    a.) M-W-F   History of 2019 novel coronavirus disease (COVID-19) 03/06/2020   a.) s/p Tx with monoclonal Ab infusion   HLD (hyperlipidemia)    Hyperkalemia 12/23/2020   Hypertension    Left carotid artery stenosis 08/18/2021   a.) Carotid Doppler 61/95/0932: 67-12% LICA stenosis   Long term current use of anticoagulant    a.) apixaban for DVT   NICM (nonischemic cardiomyopathy) ([place])    a.) 07/2009  MV: EF 48%; b.) 08/2019 Echo: EF 45-50%. Global HK. Mod LVH. GrIDD; c.) 06/2020 Echo: EF 30-35%, glob HK, mild LVH, GrII DD, nl RV fxn, mild LAE; d.) 06/2020 Cath: nonobs dzs. e.)  LHC 06/03/2021; EF 45-50%; LVEDP 18 mmHg; nonobstructive CAD. f.) b.)  TTE 08/08/2021: EF 40-45%; global HK; mild LVH; GLS -8.6; mild LA dilation.   NSTEMI (non-ST elevated myocardial infarction) ([place]) 07/15/2020   a.) hsTnI trended: 124--> 127--> 176 --> 190 --> 200 ng/L.   Sepsis ([place])    Suicidal ideations    TIA (transient ischemic attack) 06/2021   Type 2 diabetes mellitus treated with insulin [facility])     Patient Active Problem List   Diagnosis Date Noted   NSTEMI (non-ST elevated myocardial infarction) ([place]) 06/07/2022   GERD without esophagitis 05/23/2022   Dyslipidemia 05/23/2022   End-stage renal disease on hemodialysis ([place]) 05/23/2022   Flash pulmonary edema ([place]) 05/23/2022   Hypertensive urgency 05/18/2022   Respiratory tract infection 05/17/2022   Malnutrition of moderate degree 10/08/2021   Abdominal pain 10/07/2021   Depression 10/06/2021   Stomach upset 10/06/2021   Left upper extremity swelling 10/05/2021   Steal syndrome as complication of dialysis access ([place]) 10/04/2021   Chest pain 10/02/2021   Diffuse pain in left upper extremity 10/01/2021   Sepsis ([place]) 08/29/2021   Generalized weakness  08/12/2021   Non-ST elevation (NSTEMI) myocardial infarction ([place]) 08/08/2021   History of DVT (deep vein thrombosis) 08/08/2021   Current long-term use of anticoagulant medication with history of DVT left axillary vein 05/2021 08/08/2021   Carotid stenosis 08/03/2021   ESRD on hemodialysis ([place]) 07/14/2021   TIA (transient ischemic attack) 07/14/2021   Acute deep vein thrombosis (DVT) of axillary vein ([place]) - left. diagnosed 05-2021 07/14/2021   Shortness of breath 06/01/2021   History of anemia due to CKD 06/01/2021   Acute on chronic combined systolic and diastolic CHF (congestive heart failure)  ([place]) 12/23/2020   Hyperlipidemia associated with type 2 diabetes mellitus ([place]) 12/23/2020   Coronary artery disease of native artery of native heart with stable angina pectoris [facility])    Demand ischemia    Severe recurrent major depression without psychotic features ([place]) 07/16/2020   Elevated troponin 07/15/2020   Uncontrolled type 2 diabetes mellitus with hyperglycemia, with long-term current use of insulin ([place]) 07/15/2020   Acute on chronic HFrEF (heart failure with reduced ejection fraction) ([place]) 07/15/2020   HLD (hyperlipidemia) 07/15/2020   COVID-19 virus infection 02/2020   Anemia of chronic kidney failure, stage 5 ([place]) 02/2020   HFrEF (heart failure with reduced ejection fraction) ([place])    Hypertension associated with diabetes ([place])    Hypertensive emergency 09/11/2019   Type 2 diabetes mellitus with hyperlipidemia ([place]) 09/11/2019     Physical Exam  Triage Vital Signs: ED Triage Vitals  Enc Vitals Group     BP 07/10/22 0407 (!) 163/141     Pulse Rate 07/10/22 0407 (!) 123     Resp 07/10/22 0407 (!) 30     Temp --      Temp src --      SpO2 07/10/22 0407 (!) 82 %     Weight 07/10/22 0409 153 lb 8.1 oz (69.6 kg)     Height 07/10/22 0409 5\' 6"  (1.676 m)     Head Circumference --      Peak Flow --      Pain Score --      Pain Loc --      Pain Edu? --      Excl. in [place]? --     Most recent vital signs: Vitals:   07/10/22 0545 07/10/22 0600  BP:  (!) 171/106  Pulse: 100 99  Resp: (!) 25 (!) 25  SpO2: 99% 99%     General: Awake, patient is yelling and is in respiratory distress frequently spitting up into emesis basi CV:  Pitting edema in the bilateral lower extremities Resp:  Frequent wet sounding cough with sputum production, crackles rales throughout increased work of breathing Abd:  No distention.  Neuro:             Awake, Alert, Oriented x 3  Other:     ED Results / Procedures / Treatments  Labs (all labs ordered are listed, but only abnormal  results are displayed) Labs Reviewed  BASIC METABOLIC PANEL - Abnormal; Notable for the following components:      Result Value   Sodium 129 (*)    Chloride 95 (*)    CO2 21 (*)    Glucose, Bld 972 (*)    BUN 58 (*)    Creatinine, Ser 5.29 (*)    GFR, Estimated 12 (*)    All other components within normal limits  CBC - Abnormal; Notable for the following components:   WBC 13.9 (*)    RBC 3.28 (*)  Hemoglobin 10.2 (*)    HCT 30.7 (*)    All other components within normal limits  BRAIN NATRIURETIC PEPTIDE - Abnormal; Notable for the following components:   B Natriuretic Peptide >4,500.0 (*)    All other components within normal limits  BLOOD GAS, VENOUS - Abnormal; Notable for the following components:   pH, Ven 7.24 (*)    pO2, Ven 51 (*)    Acid-base deficit 4.5 (*)    All other components within normal limits  CBG MONITORING, ED - Abnormal; Notable for the following components:   Glucose-Capillary >600 (*)    All other components within normal limits  TROPONIN I (HIGH SENSITIVITY) - Abnormal; Notable for the following components:   Troponin I (High Sensitivity) 610 (*)    All other components within normal limits  URINALYSIS, ROUTINE W REFLEX MICROSCOPIC  HEPATIC FUNCTION PANEL  HEPATITIS B SURFACE ANTIGEN  HEPATITIS B SURFACE ANTIBODY, QUANTITATIVE  TROPONIN I (HIGH SENSITIVITY)     EKG  EKG reviewed and interpreted myself shows sinus tachycardia normal axis normal intervals ST depression inferior leads V5 V6   RADIOLOGY I reviewed and interpreted the chest x-ray which shows pulmonary edema   PROCEDURES:  Critical Care performed: Yes, see critical care procedure note(s)  .1-3 Lead EKG Interpretation  Performed by: Rada Hay, MD Authorized by: Rada Hay, MD     Interpretation: abnormal     ECG rate assessment: tachycardic     Rhythm: sinus tachycardia     Ectopy: none     Conduction: normal   .Critical Care  Performed by: Rada Hay, MD Authorized by: Rada Hay, MD   Critical care provider statement:    Critical care time (minutes):  30   Critical care was time spent personally by me on the following activities:  Development of treatment plan with patient or surrogate, discussions with consultants, evaluation of patient's response to treatment, examination of patient, ordering and review of laboratory studies, ordering and review of radiographic studies, ordering and performing treatments and interventions, pulse oximetry, re-evaluation of patient's condition and review of old charts   The patient is on the cardiac monitor to evaluate for evidence of arrhythmia and/or significant heart rate changes.   MEDICATIONS ORDERED IN ED: Medications  nitroGLYCERIN 50 mg in dextrose 5 % 250 mL (0.2 mg/mL) infusion (5 mcg/min Intravenous New Bag/Given 07/10/22 0449)  insulin regular, human (MYXREDLIN) 100 units/ 100 mL infusion (11 Units/hr Intravenous New Bag/Given 07/10/22 0511)  dextrose 5 % in lactated ringers infusion (has no administration in time range)  dextrose 50 % solution 0-50 mL (has no administration in time range)  Chlorhexidine Gluconate Cloth 2 % PADS 6 each (has no administration in time range)  ondansetron (ZOFRAN) injection 4 mg (4 mg Intravenous Given 07/10/22 0433)  LORazepam (ATIVAN) injection 0.5 mg (0.5 mg Intravenous Given 07/10/22 0439)  aspirin chewable tablet 324 mg (324 mg Oral Given 07/10/22 0513)  furosemide (LASIX) injection 80 mg (80 mg Intravenous Given 07/10/22 0513)  haloperidol lactate (HALDOL) injection 2 mg (2 mg Intravenous Given 07/10/22 0541)     IMPRESSION / MDM / ASSESSMENT AND PLAN / ED COURSE  I reviewed the triage vital signs and the nursing notes.                              Patient's presentation is most consistent with acute presentation with potential threat to life or bodily  function.  Differential diagnosis includes, but is not limited to, exacerbation of  COPD, pulmonary edema, pneumonia, pneumothorax The patient is a 62 year old male with multiple medical comorbidities presenting with shortness of breath.  Seen in the ED 2 days ago for what was treated as bronchitis and discharged.  On arrival to ED in triage patient is flailing yelling difficult to assess.  He is hypertensive tachypneic and hypoxic on room air placed on 4 L.  When I assessed him he is coughing and spitting up into emesis bag but is not vomiting.  He is groaning and has wheezing rhonchi and rales throughout.  Does have some pitting edema lower extremities right greater than left.  Will put on BiPAP obtain VBG and labs chest x-ray.  Patient is quite hypertensive initial BP 214/107.  Will start a nitro drip.  Patient did require some IV lorazepam to tolerate the BiPAP afterward he is pulling good tidal volumes.  Chest x-ray does show some pulmonary edema.  BNP greater than 4500 troponin 610.  Of note blood sugar was also almost thousand but no anion gap.  With his end-stage renal disease and concern for pulmonary edema will start on IV insulin drip but defer fluids at this time.  Give aspirin load and Lasix.  Patient's EKG does not show obvious ischemic changes he is not having chest pain I suspect the elevated troponins in the setting of demand from CHF  Patient's blood pressure is improving on nitro drip 170/100 his last BP.  Patient was notably quite agitated on the BiPAP received half milligram of Ativan and 2 mg of haloperidol.  Patient requested to have the BiPAP removed.  Given his sats are okay he did take off the BiPAP which led to him being much more calm.  His work of breathing is improved from when he got here initially.  To reach out to nephrology Dr. Candiss Norse he will arrange for dialysis in the morning.      FINAL CLINICAL IMPRESSION(S) / ED DIAGNOSES   Final diagnoses:  Hyperglycemia  Acute respiratory failure with hypoxia (HCC)  Flash pulmonary edema ([place])     Rx / DC  Orders   ED Discharge Orders     None        Note:  This document was prepared using Dragon voice recognition software and may include unintentional dictation errors.   Rada Hay, MD 07/10/22 Cindie Laroche    Rada Hay, MD 07/10/22 [phone]

## 2022-07-10 NOTE — ED Provider Notes (Signed)
Care assumed of patient from outgoing provider.  See their note for initial history, exam and plan.  Clinical Course as of 07/10/22 0705  Mon Jul 10, 2022  0702 ESRD and CHF (EF 30), resp distress that was sudden onset. BiPAP w/ worsening agitation - given ativan and haldol. On nitro and CXR concerning for edema.  No missed HD. Hyperglycemia with concern for HHS. HFNC and off of BiPAP.  Nephrology consulted for emergent HD.  Admit to hospitalist.  [SM]    Clinical Course User Index [SM] Nathaniel Man, MD     Nathaniel Man, MD 07/10/22 [phone]

## 2022-07-10 NOTE — Progress Notes (Signed)
Hypotensive. Turned UF off. Decreased BFR to 350.

## 2022-07-10 NOTE — H&P (Addendum)
History and Physical    Patient: Jimmy Olson TMH:[id number] DOB: 12/18/1960 DOA: 07/10/2022 DOS: the patient was seen and examined on 07/11/2022 PCP: [facility]  Patient coming from: Home  Chief Complaint:  Chief Complaint  Patient presents with   Chest Pain   Shortness of Breath   HPI: Jimmy Olson is a 62 y.o. male with medical history significant for insulin-dependent diabetes mellitus type 2 complications of end-stage renal disease on hemodialysis (T/TH/S), depression, anemia of end-stage renal disease, COPD, coronary artery disease status post atherectomy and drug-eluting stent placement to mid LAD (12/23) who presents to the emergency room via private vehicle for evaluation of chest pain and shortness of breath. Shortness of breath was sudden in onset according to his wife and started about 10 PM.  It was associated with chest pain, nausea and diaphoresis. In triage he was diaphoretic upon presentation, had increased work of breathing and appeared uncomfortable. Blood pressure upon arrival to the ER was significantly elevated at 163/141, heart rate of 123, respiratory rate of 30, room air pulse oximetry of 82% and required oxygen that was titrated up to 6 L to maintain pulse oximetry greater than 96%. Patient was placed on BiPAP due to increased work of breathing and started on a nitro drip.  He was unable to tolerate BiPAP and is currently on high flow nasal cannula. Chart review shows he was seen in the ER 2 days prior to this admission for evaluation of shortness of breath and was diagnosed with acute bronchitis and discharged home on prednisone. He has a cough productive of yellow phlegm but denies having any fever or chills.  He denies having abdominal pain, no emesis, no changes in his bowel habits, no dizziness, no lightheadedness Abnormal labs include white count 13.9, BNP 4500, troponin 610 >> 644, sodium 129, glucose 972 Venous blood gas 7.2  6/54/109/20 4.2 Chest x-ray reviewed by me showed pulmonary edema Nephrology was consulted emergently and renal replacement has been initiated He will be admitted to the hospital for further evaluation  Review of Systems: As mentioned in the history of present illness. All other systems reviewed and are negative. Past Medical History:  Diagnosis Date   Anemia    Anxiety and depression    ATN (acute tubular necrosis) (HCC)    CAD (coronary artery disease)    a. 06/2020 Cath: Nonobs dzs; b. 05/2021 NSTEMI/Cath: Nonobs dzs->demand isch; c. 05/2022 Cath: LM mild dzs, LAD 40ost/p, 9m, D1 70, RI mild dzs, LCX small, mild dzs, RCA 20p, 68m/d, RPDA 40; d. 05/2022 Staged PCI: Orbital atherectomy of mLAD  (2.75x48 Synergy XD DES).   Chronic HFrEF (heart failure with reduced ejection fraction) ([place])    a. 08/2019 Echo: EF 45-50%; b. 06/2020 Echo: EF 30-35%; c. 04/2022 Echo: EF 40-45%, mild LVH, small apical region of noncompaction, GrII DD, nl RV fxn.   DVT of axillary vein, acute left ([place]) 05/2021   a.) Tx'd with apixaban   ESRD (end stage renal disease) on dialysis [facility])    a.) M-W-F   History of 2019 novel coronavirus disease (COVID-19) 03/06/2020   a.) s/p Tx with monoclonal Ab infusion   HLD (hyperlipidemia)    Hyperkalemia 12/23/2020   Hypertension    Left carotid artery stenosis 08/18/2021   a.) Carotid Doppler 92/04/9416: 40-81% LICA stenosis   Long term current use of anticoagulant    a.) apixaban for DVT   Mixed Ischemic & Nonischemic Cardiomyopathy    a. 08/2019  Echo: EF 45-50%; b. 06/2020 Echo: EF 30-35%; c. 04/2022 Echo: EF 40-45%, mild LVH, small apical region of noncompaction, GrII DD, nl RV fxn.   NSTEMI (non-ST elevated myocardial infarction) ([place]) 07/15/2020   Sepsis ([place])    Suicidal ideations    TIA (transient ischemic attack) 06/2021   Type 2 diabetes mellitus treated with insulin [facility])    Past Surgical History:  Procedure Laterality Date   APPENDECTOMY     AV FISTULA  PLACEMENT Left 09/29/2021   Procedure: ARTERIOVENOUS (AV) FISTULA CREATION ( RADIAL CEPHALIC);  Surgeon: Algernon Huxley, MD;  Location: [facility] ORS;  Service: Vascular;  Laterality: Left;   CARDIAC CATHETERIZATION     CORONARY ATHERECTOMY N/A 06/07/2022   Procedure: CORONARY ATHERECTOMY;  Surgeon: Wellington Hampshire, MD;  Location: [place] CV LAB;  Service: Cardiovascular;  Laterality: N/A;   CORONARY STENT INTERVENTION N/A 06/07/2022   Procedure: CORONARY STENT INTERVENTION;  Surgeon: Wellington Hampshire, MD;  Location: [place] CV LAB;  Service: Cardiovascular;  Laterality: N/A;   DIALYSIS/PERMA CATHETER INSERTION N/A 06/02/2021   Procedure: DIALYSIS/PERMA CATHETER INSERTION;  Surgeon: Algernon Huxley, MD;  Location: [place] CV LAB;  Service: Cardiovascular;  Laterality: N/A;   INTRAVASCULAR IMAGING/OCT N/A 06/07/2022   Procedure: INTRAVASCULAR IMAGING/OCT;  Surgeon: Wellington Hampshire, MD;  Location: [place] CV LAB;  Service: Cardiovascular;  Laterality: N/A;   LEFT HEART CATH AND CORONARY ANGIOGRAPHY N/A 06/03/2021   Procedure: LEFT HEART CATH AND CORONARY ANGIOGRAPHY;  Surgeon: Wellington Hampshire, MD;  Location: [place] CV LAB;  Service: Cardiovascular;  Laterality: N/A;   LIGATION OF ARTERIOVENOUS  FISTULA Left 10/02/2021   Procedure: LIGATION OF ARTERIOVENOUS  FISTULA;  Surgeon: Evaristo Bury, MD;  Location: [facility] ORS;  Service: Vascular;  Laterality: Left;   NASAL SINUS SURGERY     RIGHT/LEFT HEART CATH AND CORONARY ANGIOGRAPHY N/A 07/19/2020   Procedure: RIGHT/LEFT HEART CATH AND CORONARY ANGIOGRAPHY;  Surgeon: Wellington Hampshire, MD;  Location: [place] CV LAB;  Service: Cardiovascular;  Laterality: N/A;   RIGHT/LEFT HEART CATH AND CORONARY ANGIOGRAPHY N/A 05/26/2022   Procedure: RIGHT/LEFT HEART CATH AND CORONARY ANGIOGRAPHY;  Surgeon: Wellington Hampshire, MD;  Location: [place] CV LAB;  Service: Cardiovascular;  Laterality: N/A;   Social History:  reports that he has  never smoked. He has never used smokeless tobacco. He reports that he does not currently use alcohol. He reports that he does not use drugs.  No Known Allergies  Family History  Problem Relation Age of Onset   Kidney failure Mother        died @ 70   Heart failure Mother    Other Father        he never knew his father   Diabetes Brother     Prior to Admission medications   Medication Sig Start Date End Date Taking? Authorizing Provider  acetaminophen (TYLENOL) 325 MG tablet Take 650 mg by mouth every 6 (six) hours as needed.    [provider]  albuterol (PROVENTIL HFA) 108 (90 Base) MCG/ACT inhaler INHALE 2 PUFFS INTO THE LUNGS ONCE EVERY 6 HOURS AS NEEDED FOR WHEEZING OR SHORTNESS OF BREATH. 07/13/21     albuterol (PROVENTIL HFA) 108 (90 Base) MCG/ACT inhaler Inhale 2 puffs into the lungs every 4 (four) hours as needed for wheezing or shortness of breath. 07/07/22   Carrie Mew, MD  aspirin EC 81 MG tablet Take 1 tablet (81 mg total) by mouth daily. Swallow whole. 06/02/22  Gerrie Nordmann, NP  carvedilol (COREG) 25 MG tablet Take 1 tablet (25 mg total) by mouth 2 (two) times daily. 11/29/21   Alisa Graff, FNP  clopidogrel (PLAVIX) 75 MG tablet Take 1 tablet (75 mg total) by mouth daily. 06/02/22   Gerrie Nordmann, NP  doxycycline (VIBRAMYCIN) 100 MG capsule Take 1 capsule (100 mg total) by mouth 2 (two) times daily for 10 days. 07/07/22 07/17/22  Carrie Mew, MD  escitalopram (LEXAPRO) 10 MG tablet Take 1 tablet (10 mg total) by mouth at bedtime. 05/19/22 06/23/22  Sidney Ace, MD  hydrALAZINE (APRESOLINE) 100 MG tablet Take 1 tablet (100 mg total) by mouth 2 (two) times daily. 05/19/22 06/23/22  Ralene Muskrat B, MD  insulin detemir (LEVEMIR FLEXPEN) 100 UNIT/ML FlexPen Inject 15 Units into the skin at bedtime. 05/28/22 01/18/24  Annita Brod, MD  Insulin Pen Needle 32G X 4 MM MISC USE AS DIRECTED 06/07/21   Ralene Muskrat B, MD  isosorbide mononitrate  (IMDUR) 30 MG 24 hr tablet Take 1 tablet (30 mg total) by mouth in the morning and at bedtime. 11/29/21   Alisa Graff, FNP  losartan (COZAAR) 100 MG tablet Take 1 tablet (100 mg total) by mouth daily. 05/19/22 06/23/22  Sidney Ace, MD  nitroGLYCERIN (NITROSTAT) 0.4 MG SL tablet Place 1 tablet (0.4 mg total) under the tongue every 5 (five) minutes as needed for chest pain. 05/28/22   Annita Brod, MD  NOVOLOG FLEXPEN 100 UNIT/ML FlexPen Inject 0-15 Units into the skin 3 (three) times daily with meals. 0-15 units tid 0-5 units qhs 12/28/21   [provider]  Nutritional Supplements (FEEDING SUPPLEMENT, NEPRO CARB STEADY,) LIQD Take 237 mLs by mouth 3 (three) times daily between meals. 10/08/21   Ezekiel Slocumb, DO  pantoprazole (PROTONIX) 20 MG tablet Take 1 tablet (20 mg total) by mouth daily. 06/27/22   Minna Merritts, MD  predniSONE (DELTASONE) 20 MG tablet Take 2 tablets (40 mg total) by mouth daily with breakfast for 4 days. 07/07/22 07/11/22  Carrie Mew, MD  rosuvastatin (CRESTOR) 40 MG tablet Take 1 tablet (40 mg total) by mouth daily. 05/19/22 06/23/22  Sidney Ace, MD  torsemide (DEMADEX) 20 MG tablet Take 1 tablet (20 mg total) by mouth daily. Takes on Sat and Sun 06/27/22   Minna Merritts, MD  gabapentin (NEURONTIN) 300 MG capsule Take 1 capsule (300 mg total) by mouth at bedtime for 7 days. 06/07/21 08/18/21  Sidney Ace, MD  isosorbide dinitrate (ISORDIL) 30 MG tablet TAKE 1 TABLET BY MOUTH ONCE DAILY. 07/12/21 07/13/21      Physical Exam: Vitals:   07/11/22 0830 07/11/22 0900 07/11/22 0930 07/11/22 1000  BP: 136/79  138/82 136/83  Pulse: 84  83 81  Resp: 19  16 16   Temp:      TempSrc:      SpO2: 94% 94% 96% 92%  Weight:      Height:       Physical Exam Vitals and nursing note reviewed.  Constitutional:      Comments: Acutely ill-appearing.  Appears uncomfortable  HENT:     Head: Normocephalic and atraumatic.  Eyes:     Comments:  Pale conjunctiva  Cardiovascular:     Rate and Rhythm: Tachycardia present.     Heart sounds: Normal heart sounds.  Pulmonary:     Effort: Tachypnea present.     Breath sounds: Examination of the right-upper field reveals wheezing. Examination of the  left-upper field reveals wheezing. Examination of the right-middle field reveals wheezing. Examination of the left-middle field reveals wheezing. Examination of the right-lower field reveals wheezing and rales. Examination of the left-lower field reveals wheezing and rales. Wheezing and rales present.  Abdominal:     General: Bowel sounds are normal.     Palpations: Abdomen is soft.  Musculoskeletal:     Cervical back: Normal range of motion and neck supple.     Right lower leg: Edema present.     Left lower leg: Edema present.  Skin:    General: Skin is warm and dry.  Neurological:     Mental Status: He is alert and oriented to person, place, and time.     Motor: Weakness present.  Psychiatric:        Mood and Affect: Mood is anxious.     Data Reviewed: Relevant notes from primary care and specialist visits, past discharge summaries as available in EHR, including Care Everywhere. Prior diagnostic testing as pertinent to current admission diagnoses Updated medications and problem lists for reconciliation ED course, including vitals, labs, imaging, treatment and response to treatment Triage notes, nursing and pharmacy notes and ED provider's notes Notable results as noted in HPI Labs reviewed.  Sodium 129, potassium 5.1, chloride 95, bicarb 21, glucose 172, BUN 58, creatinine 5.29, calcium 9.0, BNP greater than 4500, white count 13.9, hemoglobin 10.2, hematocrit 30.7, platelet count 284 CT scan of the head without contrast shows no acute findings Twelve-lead EKG reviewed by me shows sinus tachycardia.  Diffuse nonspecific repolarization changes There are no new results to review at this time.  Assessment and Plan: * Acute hypoxic  respiratory failure ([place]) Patient presented to the ER for evaluation of severe shortness of breath and chest pain and was noted to be tachypneic with increased work of breathing and had room air pulse oximetry of 82%. Acute hypoxic respiratory failure thought to be secondary to flash pulmonary edema from hypertensive urgency/fluid overload.  Patient with known end-stage renal disease He is currently on high flow nasal cannula at 6 L to maintain pulse oximetry greater than 92% since he was unable to tolerate BiPAP We will attempt to wean patient off oxygen as tolerated once acute illness improves or resolves  Acute on chronic combined systolic and diastolic CHF (congestive heart failure) ([place]) Most likely secondary to hypertensive urgency with flash pulmonary edema Patient's last known LVEF was 40 to 45% from a 2D echocardiogram which was done 11/23  Currently receiving emergent renal replacement therapy Optimize blood pressure control   Hyperosmolar hyperglycemic state (HHS) ([place]) Noted to have significant hyperglycemia upon arrival without evidence of metabolic acidosis Significant hyperglycemia possibly due to recent systemic steroid administration for acute COPD exacerbation Patient was placed on an insulin drip which has been discontinued since blood glucose has improved Maintain consistent carbohydrate diet Continue long-acting insulin with sliding scale coverage  End-stage renal disease on hemodialysis [facility]) Dialysis days are T/TH/S Patient admitted to the hospital in respiratory distress and found to have pulmonary edema and is currently receiving emergent renal replacement therapy Appreciate nephrology input   Hypertensive emergency-resolved as of 07/10/2022 Patient with hypertensive emergency and complications of flash pulmonary edema Started on a nitroglycerin drip with improvement in his blood pressure and this has since been discontinued Will resume patient's multiple  antihypertensive medications  CAD S/P percutaneous coronary angioplasty History of coronary artery disease status post recent left heart cath with stent angioplasty Noted to have elevated troponin levels which may  be secondary to demand ischemia from hypertensive emergency and acute CHF rule out non-ST elevation MI We will trend troponin levels Continue aspirin, Plavix, statins, nitrates and beta-blockers Cardiology consult  Elevated troponin Treatment as outlined in 5  COPD with acute exacerbation ([place]) Place patient on scheduled and as needed bronchodilator therapy Place patient on inhaled steroids Continue oxygen supplementation to maintain pulse oximetry greater than 92% Complete course of doxycycline prescribed during his last ER visit.  Hyponatremia Secondary to hyperglycemia Expect improvement in sodium levels with resolution of hyperglycemia   History of anemia due to CKD H&H is stable Monitor closely during this hospitalization      Advance Care Planning:   Code Status: Full Code   Consults: Cardiology, nephrology  Family Communication: Greater than 50% of time was spent discussing patient's condition and plan of care with his wife at the bedside.  All questions and concerns have been addressed.  She verbalizes understanding and agree with the plan.  Severity of Illness: The appropriate patient status for this patient is INPATIENT. Inpatient status is judged to be reasonable and necessary in order to provide the required intensity of service to ensure the patient's safety. The patient's presenting symptoms, physical exam findings, and initial radiographic and laboratory data in the context of their chronic comorbidities is felt to place them at high risk for further clinical deterioration. Furthermore, it is not anticipated that the patient will be medically stable for discharge from the hospital within 2 midnights of admission.   * I certify that at the point of  admission it is my clinical judgment that the patient will require inpatient hospital care spanning beyond 2 midnights from the point of admission due to high intensity of service, high risk for further deterioration and high frequency of surveillance required.* Total critical care time spent on this patient was spent on this patient was 70 minutes Author: Collier Bullock, MD 07/11/2022 10:24 AM  For on call review [email or web address].

## 2022-07-10 NOTE — Assessment & Plan Note (Addendum)
Most likely secondary to hypertensive urgency with flash pulmonary edema Patient's last known LVEF was 40 to 45% from a 2D echocardiogram which was done 11/23 .  With volume improved through dialysis and Lasix, back to baseline

## 2022-07-10 NOTE — Assessment & Plan Note (Signed)
H&H is stable ?Monitor closely during this hospitalization ?

## 2022-07-10 NOTE — Assessment & Plan Note (Signed)
Place patient on scheduled and as needed bronchodilator therapy Place patient on inhaled steroids Continue oxygen supplementation to maintain pulse oximetry greater than 92% Complete course of doxycycline prescribed during his last ER visit.

## 2022-07-10 NOTE — Inpatient Diabetes Management (Signed)
Inpatient Diabetes Program Recommendations  AACE/ADA: New Consensus Statement on Inpatient Glycemic Control (2015)  Target Ranges:  Prepandial:   less than 140 mg/dL      Peak postprandial:   less than 180 mg/dL (1-2 hours)      Critically ill patients:  140 - 180 mg/dL   Lab Results  Component Value Date   GLUCAP 203 (H) 07/10/2022   HGBA1C 10.1 (H) 05/17/2022    Review of Glycemic Control  Diabetes history: DM2 Outpatient Diabetes medications:  Lantus 15 units QD Novolog 0-15 units TID Current orders for Inpatient glycemic control:  IV insulin-transitioning to Novolog 0-6 units TID and 0-5 units QHS, Levemir 15 units QHS  Inpatient Diabetes Program Recommendations:    Please administer Levemir 15 units now and then QD.  Basal insulin should be administered 2 hrs prior to discontinuing IV insulin.    Will continue to follow while inpatient.  Thank you, Reche Dixon, MSN, [place] Diabetes Coordinator Inpatient Diabetes Program [phone] (team pager from 8a-5p)

## 2022-07-10 NOTE — Assessment & Plan Note (Addendum)
Pseudohyponatremia to hyperglycemia as well as hypervolemia from CHF.  Improved with treatment of hyperglycemia

## 2022-07-10 NOTE — ED Notes (Signed)
Pt receiving dialysis in room

## 2022-07-10 NOTE — Assessment & Plan Note (Addendum)
Noted to have significant hyperglycemia upon arrival without evidence of metabolic acidosis.  Significant hyperglycemia possibly due to recent systemic steroid administration for acute COPD exacerbation.  Patient was placed on an insulin drip which has been discontinued since blood glucose has improved.  Continue long-acting insulin with sliding scale coverage

## 2022-07-10 NOTE — Progress Notes (Signed)
BP dropped to 84/69. UF turned off. 100 ml bolus given.

## 2022-07-10 NOTE — Progress Notes (Signed)
Bedside HD report from Anda Latina, RN. Relieving for break.

## 2022-07-10 NOTE — Assessment & Plan Note (Addendum)
Seen by cardiology and elevated troponins felt to be perfusion mismatch.  Repeat echocardiogram ordered and unchanged, no further cardiology intervention needed.  Echo is pending.  Troponins are trending downward.

## 2022-07-10 NOTE — ED Triage Notes (Signed)
Pt presents via POV with complaints of CP and SOB. Pt is yelling in pain, visibly uncomfortable, and diaphoretic. Hx of CHF & Dialysis. Pts sats on RA 82% - Pt placed on 2L [place] and titrated up to 6L - O2 sat 96%.

## 2022-07-10 NOTE — Consult Note (Signed)
Cardiology Consult    Patient ID: Jimmy Olson MRN: [id number], DOB/AGE: Oct 27, 1960   Admit date: 07/10/2022 Date of Consult: 07/10/2022  Primary Physician: [facility] [place] Primary Cardiologist: Ida Rogue, MD Requesting Provider: Milon Dikes, MD  Patient Profile    Jimmy Olson is a 62 y.o. male with a history of coronary artery disease status post LAD stenting in December 2023, mixed ischemic and nonischemic cardiomyopathy with an EF of 40-45% November 2023, chronic HFrEF, hypertension, hyperlipidemia, type 2 diabetes mellitus, end-stage renal disease on hemodialysis, left upper extremity DVT previously on Eliquis, and TIA, who is being seen today for the evaluation of troponin elevation at the request of Dr. Francine Graven.  Past Medical History   Past Medical History:  Diagnosis Date   Anemia    Anxiety and depression    ATN (acute tubular necrosis) (HCC)    CAD (coronary artery disease)    a. 06/2020 Cath: Nonobs dzs; b. 05/2021 NSTEMI/Cath: Nonobs dzs->demand isch; c. 05/2022 Cath: LM mild dzs, LAD 40ost/p, 51m, D1 70, RI mild dzs, LCX small, mild dzs, RCA 20p, 58m/d, RPDA 40; d. 05/2022 Staged PCI: Orbital atherectomy of mLAD  (2.75x48 Synergy XD DES).   Chronic HFrEF (heart failure with reduced ejection fraction) ([place])    a. 08/2019 Echo: EF 45-50%; b. 06/2020 Echo: EF 30-35%; c. 04/2022 Echo: EF 40-45%, mild LVH, small apical region of noncompaction, GrII DD, nl RV fxn.   DVT of axillary vein, acute left ([place]) 05/2021   a.) Tx'd with apixaban   ESRD (end stage renal disease) on dialysis [facility])    a.) M-W-F   History of 2019 novel coronavirus disease (COVID-19) 03/06/2020   a.) s/p Tx with monoclonal Ab infusion   HLD (hyperlipidemia)    Hyperkalemia 12/23/2020   Hypertension    Left carotid artery stenosis 08/18/2021   a.) Carotid Doppler 78/29/5621: 30-86% LICA stenosis   Long term current use of anticoagulant    a.) apixaban for DVT   Mixed Ischemic &  Nonischemic Cardiomyopathy    a. 08/2019 Echo: EF 45-50%; b. 06/2020 Echo: EF 30-35%; c. 04/2022 Echo: EF 40-45%, mild LVH, small apical region of noncompaction, GrII DD, nl RV fxn.   NSTEMI (non-ST elevated myocardial infarction) ([place]) 07/15/2020   Sepsis ([place])    Suicidal ideations    TIA (transient ischemic attack) 06/2021   Type 2 diabetes mellitus treated with insulin [facility])     Past Surgical History:  Procedure Laterality Date   APPENDECTOMY     AV FISTULA PLACEMENT Left 09/29/2021   Procedure: ARTERIOVENOUS (AV) FISTULA CREATION ( RADIAL CEPHALIC);  Surgeon: Algernon Huxley, MD;  Location: [facility] ORS;  Service: Vascular;  Laterality: Left;   CARDIAC CATHETERIZATION     CORONARY ATHERECTOMY N/A 06/07/2022   Procedure: CORONARY ATHERECTOMY;  Surgeon: Wellington Hampshire, MD;  Location: [place] [facility];  Service: Cardiovascular;  Laterality: N/A;   CORONARY STENT INTERVENTION N/A 06/07/2022   Procedure: CORONARY STENT INTERVENTION;  Surgeon: Wellington Hampshire, MD;  Location: [place] [facility];  Service: Cardiovascular;  Laterality: N/A;   DIALYSIS/PERMA CATHETER INSERTION N/A 06/02/2021   Procedure: DIALYSIS/PERMA CATHETER INSERTION;  Surgeon: Algernon Huxley, MD;  Location: [place] [facility] LAB;  Service: Cardiovascular;  Laterality: N/A;   INTRAVASCULAR IMAGING/OCT N/A 06/07/2022   Procedure: INTRAVASCULAR IMAGING/OCT;  Surgeon: Wellington Hampshire, MD;  Location: [place] [facility] LAB;  Service: Cardiovascular;  Laterality: N/A;   LEFT HEART CATH AND CORONARY ANGIOGRAPHY N/A  06/03/2021   Procedure: LEFT HEART CATH AND CORONARY ANGIOGRAPHY;  Surgeon: Wellington Hampshire, MD;  Location: [place] [facility];  Service: Cardiovascular;  Laterality: N/A;   LIGATION OF ARTERIOVENOUS  FISTULA Left 10/02/2021   Procedure: LIGATION OF ARTERIOVENOUS  FISTULA;  Surgeon: Evaristo Bury, MD;  Location: [facility] ORS;  Service: Vascular;  Laterality: Left;   NASAL SINUS SURGERY     RIGHT/LEFT HEART CATH AND CORONARY  ANGIOGRAPHY N/A 07/19/2020   Procedure: RIGHT/LEFT HEART CATH AND CORONARY ANGIOGRAPHY;  Surgeon: Wellington Hampshire, MD;  Location: [place] [facility] LAB;  Service: Cardiovascular;  Laterality: N/A;   RIGHT/LEFT HEART CATH AND CORONARY ANGIOGRAPHY N/A 05/26/2022   Procedure: RIGHT/LEFT HEART CATH AND CORONARY ANGIOGRAPHY;  Surgeon: Wellington Hampshire, MD;  Location: [place] [facility];  Service: Cardiovascular;  Laterality: N/A;     Allergies  No Known Allergies  History of Present Illness    62 year old male with above past medical history including CAD, mixed ischemic and nonischemic cardiomyopathy, chronic HFrEF, hypertension, hyperlipidemia, diabetes, end-stage renal disease on hemodialysis, left upper extremity DVT previously on Eliquis, and TIA.  He previously had a negative stress test in 2011.  In March 2021, he was noted with chest pain and elevated troponin.  Echo showed an EF of 45 to 50% with global hypokinesis and moderate LVH.  He was markedly hypertensive at the time.  Arrangements were made for outpatient follow-up and stress testing but he did not follow-up.  In January 2022, he was admitted with recurrent heart failure complicated by stage IV kidney disease.  Echo showed an EF of 30 to 35%.  Diagnostic catheterization showed moderate, nonobstructive CAD mildly elevated filling pressures.  He was readmitted in December 2022 secondary to dyspnea and chest pressure, ruling in for non-STEMI with a troponin of 4844.  Repeat diagnostic catheterization showed relatively stable anatomy and presentation was felt to be secondary to demand ischemia in the setting of hypertensive urgency.  He was most recently admitted in December 2023 in the setting of hypertensive urgency and pulmonary edema despite compliance with outpatient dialysis.  He again had troponin elevation into the 400 range.  He underwent diagnostic catheterization which showed progression of mid LAD disease, with 80% stenosis.  It  was felt he would benefit most from a staged intervention in orbital atherectomy, and this was subsequently performed at [facility] with drug-eluting stent placement on June 07, 2022.  It was noted at the time of PCI that he had moderate ostial LAD stenosis, which if it were to progress, would not be optimal for PCI.  Mr. Cerasoli was seen in cardiology clinic in January 5, at which time he was doing well.  Unfortunate, he began to experience dyspnea was seen in the emergency department on January 19.  Chest x-ray suggested CHF.  There was also concern for bronchitis and he was treated with prednisone and subsequently discharged.  Unfortunately, on the evening of January 21, he had more abrupt onset of dyspnea with associated chest pain and nausea.  He presented back to the emergency department where he was found to be hypertensive at 163/141 and tachycardic in sinus rhythm and 123 bpm.  Room air pulse ox was 82% which improved to 96% on 6 L nasal cannula.  He was subsequently placed on BiPAP and subsequently high flow nasal cannula (did not tolerate BiPAP) due to increased work of breathing, as well as a nitroglycerin infusion.  Chest x-ray showed pulmonary edema.  Labs notable  for marked hyperglycemia with a glucose of 972 (treated with insulin).  BNP was elevated at 4500 and troponin rose from 610  644  1723.  He was seen by nephrology and placed on ultrafiltration with subsequent development of hypotension.  Nitroglycerin was discontinued and he received 10 mg of midodrine earlier this morning.  Blood pressure currently 156/107.  Patient interviewed using remote interpreter services.  He denies any current chest pain or dyspnea.  Complains only of being very cold and has been provided multiple warm blankets.  Inpatient Medications     [START ON 07/11/2022] aspirin EC  81 mg Oral Daily   carvedilol  25 mg Oral BID   Chlorhexidine Gluconate Cloth  6 each Topical Q0600   clopidogrel  75 mg Oral Daily    doxycycline  100 mg Oral BID   escitalopram  10 mg Oral QHS   feeding supplement (NEPRO CARB STEADY)  237 mL Oral TID BM   heparin  5,000 Units Subcutaneous Q8H   hydrALAZINE  100 mg Oral BID   insulin aspart  0-6 Units Subcutaneous TID WC   insulin detemir  15 Units Subcutaneous QHS   ipratropium-albuterol  3 mL Nebulization Q6H   isosorbide mononitrate  60 mg Oral BID   losartan  100 mg Oral Daily   [START ON 07/11/2022] pantoprazole  20 mg Oral Daily   [START ON 07/11/2022] rosuvastatin  40 mg Oral Daily   sodium chloride flush  3 mL Intravenous Q12H   [START ON 07/15/2022] torsemide  20 mg Oral Once per day on Sun Sat    Family History    Family History  Problem Relation Age of Onset   Kidney failure Mother        died @ 65   Heart failure Mother    Other Father        he never knew his father   Diabetes Brother    He indicated that his mother is deceased. He indicated that his father is deceased. He indicated that his brother is alive.   Social History    Social History   Socioeconomic History   Marital status: Married    Spouse name: Ana   Number of children: 7   Years of education: Not on file   Highest education level: Not on file  Occupational History   Not on file  Tobacco Use   Smoking status: Never   Smokeless tobacco: Never  Vaping Use   Vaping Use: Never used  Substance and Sexual Activity   Alcohol use: Not Currently   Drug use: Never   Sexual activity: Not on file  Other Topics Concern   Not on file  Social History Narrative   Lives locally with wife and son.  He is currently unemployed - has worked in different industries.     Social Determinants of Health   Financial Resource Strain: Not on file  Food Insecurity: No Food Insecurity (05/24/2022)   Hunger Vital Sign    Worried About Running Out of Food in the Last Year: Never true    Ran Out of Food in the Last Year: Never true  Transportation Needs: No Transportation Needs (05/24/2022)    PRAPARE - Hydrologist (Medical): No    Lack of Transportation (Non-Medical): No  Physical Activity: Not on file  Stress: Not on file  Social Connections: Not on file  Intimate Partner Violence: Not At Risk (05/24/2022)   Humiliation, Afraid, Rape, and Kick  questionnaire    Fear of Current or Ex-Partner: No    Emotionally Abused: No    Physically Abused: No    Sexually Abused: No     Review of Systems    General:  No chills, fever, night sweats or weight changes.  Cardiovascular:  +++ chest pain, +++ dyspnea on exertion, no edema, orthopnea, palpitations, paroxysmal nocturnal dyspnea. Dermatological: No rash, lesions/masses Respiratory: No cough, +++ dyspnea Urologic: No hematuria, dysuria Abdominal:   No nausea, vomiting, diarrhea, bright red blood per rectum, melena, or hematemesis Neurologic:  No visual changes, wkns, changes in mental status. All other systems reviewed and are otherwise negative except as noted above.  Physical Exam    Blood pressure (!) 157/92, pulse 93, temperature 98.1 F (36.7 C), temperature source Axillary, resp. rate 19, height 5\' 6"  (1.676 m), weight 69.6 kg, SpO2 100 %.  General: Pleasant, NAD Psych: Flat affect. Neuro: Alert and oriented X 3. Moves all extremities spontaneously. HEENT: Normal  Neck: Supple without bruits or JVD. Lungs:  Resp regular and unlabored, diminished breath sounds bilaterally. Heart: RRR no s3, s4, or murmurs. Abdomen: Soft, non-tender, non-distended, BS + x 4.  Extremities: No clubbing, cyanosis or edema. DP/PT2+, Radials 2+ and equal bilaterally.  Labs    Cardiac Enzymes Recent Labs  Lab 07/10/22 0404 07/10/22 0615 07/10/22 0857  TROPONINIHS 610* 644* 1,723*     BNP    Component Value Date/Time   BNP >4,500.0 (H) 07/10/2022 0404    ProBNP No results found for: "PROBNP"  Lab Results  Component Value Date   WBC 13.9 (H) 07/10/2022   HGB 10.2 (L) 07/10/2022   HCT 30.7 (L)  07/10/2022   MCV 93.6 07/10/2022   PLT 284 07/10/2022    Recent Labs  Lab 07/10/22 0404 07/10/22 0615  NA 129*  --   K 5.1  --   CL 95*  --   CO2 21*  --   BUN 58*  --   CREATININE 5.29*  --   CALCIUM 9.0  --   PROT  --  7.1  BILITOT  --  0.8  ALKPHOS  --  101  ALT  --  27  AST  --  39  GLUCOSE 972*  --    Lab Results  Component Value Date   CHOL 113 10/02/2021   HDL 34 (L) 10/02/2021   LDLCALC 45 10/02/2021   TRIG 170 (H) 10/02/2021     Radiology Studies    CT HEAD WO CONTRAST (5MM)  Result Date: 07/10/2022 CLINICAL DATA:  Chest pain and shortness of breath. EXAM: CT HEAD WITHOUT CONTRAST TECHNIQUE: Contiguous axial images were obtained from the base of the skull through the vertex without intravenous contrast. RADIATION DOSE REDUCTION: This exam was performed according to the departmental dose-optimization program which includes automated exposure control, adjustment of the mA and/or kV according to patient size and/or use of iterative reconstruction technique. COMPARISON:  04/04/2022 FINDINGS: Brain: No evidence of acute infarction, hemorrhage, hydrocephalus, extra-axial collection or mass lesion/mass effect. Vascular: No hyperdense vessel or unexpected calcification. Skull: Normal. Negative for fracture or focal lesion. Sinuses/Orbits: No acute finding. Other: Generalized fairly mild motion artifact IMPRESSION: Negative motion degraded head CT. Electronically Signed   By: Jorje Guild M.D.   On: 07/10/2022 06:43   DG Chest Port 1 View  Result Date: 07/10/2022 CLINICAL DATA:  Chest pain EXAM: PORTABLE CHEST 1 VIEW COMPARISON:  Three days ago FINDINGS: Worsening interstitial opacity with Kerley lines and fissure thickening.  Chronic cardiopericardial enlargement with stable mediastinal contours including hilar vessel congestion. Dialysis catheter on the right in good position. No pneumothorax. IMPRESSION: Worsening pulmonary edema. Electronically Signed   By: Jorje Guild  M.D.   On: 07/10/2022 04:44   DG Chest Portable 1 View  Result Date: 07/07/2022 CLINICAL DATA:  Chest pain with cough EXAM: PORTABLE CHEST 1 VIEW COMPARISON:  Chest x-ray 05/22/2022 FINDINGS: Right chest port catheter tip projects over the cavoatrial junction. The heart is enlarged, unchanged. There central pulmonary vascular congestion with interstitial prominence in the lung bases. There is no focal lung consolidation, pleural effusion or pneumothorax. No acute fractures are seen. IMPRESSION: Cardiomegaly with central pulmonary vascular congestion and interstitial prominence in the lung bases, likely mild pulmonary edema. Electronically Signed   By: Ronney Asters M.D.   On: 07/07/2022 22:10    ECG & Cardiac Imaging    Sinus tachycardia, 116, nonspecific ST and T changes- personally reviewed.  Assessment & Plan    1.  Acute hypoxic respiratory failure/acute on chronic heart failure with reduced ejection fraction/mixed ischemic and nonischemic cardiomyopathy: EF of 40 to 45% by echo in November 2023 with small apical region of noncompaction and grade 2 diastolic dysfunction.  Seen in the emergency department on January 19 with dyspnea and treated for acute bronchitis-prednisone.  Chest x-ray at that time showed pulmonary vas congestion and mild edema.  Patient had fairly sudden onset of dyspnea associate with chest pain, nausea, and diaphoresis beginning approximately 10 PM on January 21, prompting ED evaluation.  He was hypertensive on arrival with oxygen saturations in the low 80s and at 1 point required high flow nasal cannula.  BNP greater than 4500 and chest x-ray with worsening pulmonary edema.  He was placed on IV nitroglycerin for hypertension and underwent ultrafiltration overnight with resultant hypotension requiring discontinuation of nitro and provision of a dose of midodrine.  Currently has no complaints of chest pain or dyspnea.  Follow-up echo is pending.  Continue beta-blocker,  hydralazine, nitrate, and ARB therapy.  Potassium is on the high side of normal at 5.1-May need to reconsider ARB going forward.  2.  Supply: Demand ischemia/coronary artery disease: Known CAD status post recent atherectomy and drug-eluting stent placement to the LAD in December 2023.  He reports compliance with dual antiplatelet P at home and was doing well when seen in the office in early January.  In the setting of #1, patient did experience chest pain with troponin elevation to current peak of 1723.  Currently symptom-free.  Follow-up echocardiogram.  Unless there is a significant decline in LV function, would suspect that presentation is most consistent with supply:demand ischemia.  Continue dual antiplatelet therapy, beta-blocker, and statin.  Will add heparin for the time being.  3.  Essential hypertension: Presented with marked hypertension in the setting of respiratory failure and subsequently became hypotensive during ultrafiltration while on IV nitroglycerin.  He was treated with a dose of midodrine.  Nitroglycerin now off and pressures trending higher again.  Patient reports compliance with home medications.  Continue beta-blocker, hydralazine, and nitrate.  Will increase isosorbide to 60 mg twice daily.  4.  Hyperlipidemia: LDL of 45 in April 2023.  Continue statin therapy.  5.  Type 2 diabetes mellitus: Insulin management per internal medicine.  6.  End-stage renal disease: On hemodialysis.  Nephrology following.  Risk Assessment/Risk Scores:     TIMI Risk Score for Unstable Angina or Non-ST Elevation MI:   The patient's TIMI risk score is  5, which indicates a 26% risk of all cause mortality, new or recurrent myocardial infarction or need for urgent revascularization in the next 14 days.      Signed, Murray Hodgkins, NP 07/10/2022, 3:15 PM  For questions or updates, please contact   Please consult [email or web address] for contact info under Cardiology/STEMI.

## 2022-07-10 NOTE — Progress Notes (Signed)
UF on, uf goal decreased from 2L to 1.5L. MD notified.

## 2022-07-10 NOTE — ED Notes (Signed)
O2 titrated down to 5L nasal cannula. O2 100%

## 2022-07-11 ENCOUNTER — Inpatient Hospital Stay (HOSPITAL_COMMUNITY)
Admit: 2022-07-11 | Discharge: 2022-07-11 | Disposition: A | Discharge status: Home / Self Care | Payer: Medicare Other | Attending: Cardiovascular Disease | Admitting: Cardiovascular Disease

## 2022-07-11 DIAGNOSIS — I5043 Acute on chronic combined systolic (congestive) and diastolic (congestive) heart failure: Secondary | ICD-10-CM | POA: Diagnosis not present

## 2022-07-11 DIAGNOSIS — J9601 Acute respiratory failure with hypoxia: Secondary | ICD-10-CM | POA: Diagnosis not present

## 2022-07-11 DIAGNOSIS — Z992 Dependence on renal dialysis: Secondary | ICD-10-CM

## 2022-07-11 DIAGNOSIS — N186 End stage renal disease: Secondary | ICD-10-CM | POA: Diagnosis not present

## 2022-07-11 DIAGNOSIS — E1100 Type 2 diabetes mellitus with hyperosmolarity without nonketotic hyperglycemic-hyperosmolar coma (NKHHC): Secondary | ICD-10-CM | POA: Diagnosis not present

## 2022-07-11 DIAGNOSIS — I161 Hypertensive emergency: Secondary | ICD-10-CM

## 2022-07-11 DIAGNOSIS — R079 Chest pain, unspecified: Secondary | ICD-10-CM | POA: Diagnosis not present

## 2022-07-11 DIAGNOSIS — R7989 Other specified abnormal findings of blood chemistry: Secondary | ICD-10-CM | POA: Diagnosis not present

## 2022-07-11 LAB — BASIC METABOLIC PANEL
Anion gap: 6 (ref 5–15)
BUN: 35 mg/dL — ABNORMAL HIGH (ref 6–20)
CO2: 30 mmol/L (ref 22–32)
Calcium: 8.2 mg/dL — ABNORMAL LOW (ref 8.9–10.3)
Chloride: 97 mmol/L — ABNORMAL LOW (ref 98–111)
Creatinine, Ser: 4.14 mg/dL — ABNORMAL HIGH (ref 0.61–1.24)
GFR, Estimated: 16 mL/min — ABNORMAL LOW (ref >60)
Glucose, Bld: 147 mg/dL — ABNORMAL HIGH (ref 70–99)
Potassium: 3.3 mmol/L — ABNORMAL LOW (ref 3.5–5.1)
Sodium: 133 mmol/L — ABNORMAL LOW (ref 135–145)

## 2022-07-11 LAB — CBC
HCT: 24.1 % — ABNORMAL LOW (ref 39.0–52.0)
Hemoglobin: 8.1 g/dL — ABNORMAL LOW (ref 13.0–17.0)
MCH: 30.3 pg (ref 26.0–34.0)
MCHC: 33.6 g/dL (ref 30.0–36.0)
MCV: 90.3 fL (ref 80.0–100.0)
Platelets: 173 10*3/uL (ref 150–400)
RBC: 2.67 MIL/uL — ABNORMAL LOW (ref 4.22–5.81)
RDW: 13.4 % (ref 11.5–15.5)
WBC: 9 10*3/uL (ref 4.0–10.5)
nRBC: 0 % (ref 0.0–0.2)

## 2022-07-11 LAB — URINALYSIS, ROUTINE W REFLEX MICROSCOPIC
Bilirubin Urine: NEGATIVE
Glucose, UA: >=500 mg/dL — AB
Ketones, ur: NEGATIVE mg/dL
Leukocytes,Ua: NEGATIVE
Nitrite: NEGATIVE
Protein, ur: >=300 mg/dL — AB
Specific Gravity, Urine: 1.009 (ref 1.005–1.030)
pH: 7 (ref 5.0–8.0)

## 2022-07-11 LAB — HEPARIN LEVEL (UNFRACTIONATED)
Heparin Unfractionated: 0.36 IU/mL (ref 0.30–0.70)
Heparin Unfractionated: 0.26 IU/mL — ABNORMAL LOW (ref 0.30–0.70)

## 2022-07-11 LAB — CBG MONITORING, ED
Glucose-Capillary: 140 mg/dL — ABNORMAL HIGH (ref 70–99)
Glucose-Capillary: 179 mg/dL — ABNORMAL HIGH (ref 70–99)
Glucose-Capillary: 264 mg/dL — ABNORMAL HIGH (ref 70–99)
Glucose-Capillary: 226 mg/dL — ABNORMAL HIGH (ref 70–99)

## 2022-07-11 LAB — TROPONIN I (HIGH SENSITIVITY): Troponin I (High Sensitivity): 9102 ng/L (ref <18)

## 2022-07-11 LAB — HEPATITIS B SURFACE ANTIBODY, QUANTITATIVE: Hep B S AB Quant (Post): <3.1 m[IU]/mL — ABNORMAL LOW (ref >9.9)

## 2022-07-11 LAB — PHOSPHORUS: Phosphorus: 3.5 mg/dL (ref 2.5–4.6)

## 2022-07-11 MED ORDER — HEPARIN BOLUS VIA INFUSION
1000.0000 [IU] | Freq: Once | INTRAVENOUS | Status: AC
  Administered 2022-07-11: 1000 [IU] via INTRAVENOUS
  Filled 2022-07-11: qty 1000

## 2022-07-11 NOTE — ED Notes (Signed)
Dietary called about pt lunch tray- they stated it's on the way.

## 2022-07-11 NOTE — ED Notes (Signed)
CBG was 179. Anda Kraft, RN was just notified.

## 2022-07-11 NOTE — Progress Notes (Addendum)
[facility]  ROUNDING NOTE   Subjective:   Jimmy Olson  is a 62 year old Hispanic male with past medical conditions including anemia, two-vessel CAD, diabetes, depression, DVT with Eliquis, and end-stage renal disease on hemodialysis.  Patient presents to the emergency department with complaints of shortness of breath and chest pain. Patient has been admitted for Flash pulmonary edema ([place]) [J81.0] Hyperglycemia [R73.9] Acute respiratory failure with hypoxia ([place]) [J96.01] Acute hypoxic respiratory failure ([place]) [J96.01]  Patient is known to our practice and receives outpatient dialysis treatments at [facility] on a TTS schedule, supervised by Dr. Candiss Norse.    Patient seen and evaluated during dialysis   HEMODIALYSIS FLOWSHEET:  Blood Flow Rate (mL/min): 350 mL/min Arterial Pressure (mmHg): -110 mmHg Venous Pressure (mmHg): 140 mmHg TMP (mmHg): 16 mmHg Ultrafiltration Rate (mL/min): 857 mL/min Dialysate Flow Rate (mL/min): 300 ml/min Dialysis Fluid Bolus: Normal Saline Bolus Amount (mL): 300 mL  Weaned to room air Tolerating treatment well   Objective:  Vital signs in last 24 hours:  Temp:  [98.4 F (36.9 C)-99.4 F (37.4 C)] 98.5 F (36.9 C) (01/23 1155) Pulse Rate:  [77-98] 83 (01/23 1155) Resp:  [12-22] 20 (01/23 1155) BP: (116-181)/(66-104) 116/71 (01/23 1155) SpO2:  [90 %-100 %] 100 % (01/23 1155) Weight:  [68.5 kg] 68.5 kg (01/23 0752)  Weight change:  Filed Weights   07/10/22 0409 07/11/22 0752  Weight: 69.6 kg 68.5 kg    Intake/Output: I/O last 3 completed shifts: In: 193.7 [I.V.:193.7] Out: 1000 [Other:1000]   Intake/Output this shift:  Total I/O In: -  Out: 2400 [Other:2400]  Physical Exam: General: NAD, resting quietly  Head: Normocephalic, atraumatic. Moist oral mucosal membranes  Eyes: Anicteric  Lungs:  Fine crackles, normal effort  Heart: Regular rate and rhythm  Abdomen:  Soft, nontender, nondistended   Extremities: No peripheral edema.  Neurologic: Somnolent, moving all four extremities  Skin: No lesions  Access: Right IJ PermCath    Basic Metabolic Panel: Recent Labs  Lab 07/07/22 2152 07/10/22 0404 07/10/22 1449 07/11/22 0623  NA 133* 129* 130* 133*  K 4.9 5.1 3.1* 3.3*  CL 101 95* 94* 97*  CO2 23 21* 25 30  GLUCOSE 423* 972* 284* 147*  BUN 39* 58* 26* 35*  CREATININE 4.44* 5.29* 2.80* 4.14*  CALCIUM 8.9 9.0 8.2* 8.2*  PHOS  --   --   --  3.5    Liver Function Tests: Recent Labs  Lab 07/07/22 2152 07/10/22 0615  AST 32 39  ALT 16 27  ALKPHOS 97 101  BILITOT 1.1 0.8  PROT 7.2 7.1  ALBUMIN 3.4* 3.6   No results for input(s): "LIPASE", "AMYLASE" in the last 168 hours. No results for input(s): "AMMONIA" in the last 168 hours.  CBC: Recent Labs  Lab 07/07/22 2152 07/10/22 0404 07/11/22 0128  WBC 5.6 13.9* 9.0  NEUTROABS 3.7  --   --   HGB 9.0* 10.2* 8.1*  HCT 26.5* 30.7* 24.1*  MCV 89.5 93.6 90.3  PLT 210 284 173    Cardiac Enzymes: No results for input(s): "CKTOTAL", "CKMB", "CKMBINDEX", "TROPONINI" in the last 168 hours.  BNP: Invalid input(s): "POCBNP"  CBG: Recent Labs  Lab 07/10/22 1130 07/10/22 1229 07/10/22 1704 07/10/22 2352 07/11/22 0729  GLUCAP 146* 203* 341* 246* 140*    Microbiology: Results for orders placed or performed during the hospital encounter of 07/10/22  Resp panel by RT-PCR (RSV, Flu A&B, Covid) Anterior Nasal Swab     Status: None  Collection Time: 07/10/22  6:49 AM   Specimen: Anterior Nasal Swab  Result Value Ref Range Status   SARS Coronavirus 2 by RT PCR NEGATIVE NEGATIVE Final    Comment: (NOTE) SARS-CoV-2 target nucleic acids are NOT DETECTED.  The SARS-CoV-2 RNA is generally detectable in upper respiratory specimens during the acute phase of infection. The lowest concentration of SARS-CoV-2 viral copies this assay can detect is 138 copies/mL. A negative result does not preclude SARS-Cov-2 infection  and should not be used as the sole basis for treatment or other patient management decisions. A negative result may occur with  improper specimen collection/handling, submission of specimen other than nasopharyngeal swab, presence of viral mutation(s) within the areas targeted by this assay, and inadequate number of viral copies(<138 copies/mL). A negative result must be combined with clinical observations, patient history, and epidemiological information. The expected result is Negative.  Fact Sheet for Patients:  EntrepreneurPulse.com.au  Fact Sheet for Healthcare Providers:  IncredibleEmployment.be  This test is no t yet approved or cleared by the [place] FDA and  has been authorized for detection and/or diagnosis of SARS-CoV-2 by FDA under an Emergency Use Authorization (EUA). This EUA will remain  in effect (meaning this test can be used) for the duration of the COVID-19 declaration under Section 564(b)(1) of the Act, 21 U.S.C.section 360bbb-3(b)(1), unless the authorization is terminated  or revoked sooner.       Influenza A by PCR NEGATIVE NEGATIVE Final   Influenza B by PCR NEGATIVE NEGATIVE Final    Comment: (NOTE) The Xpert Xpress SARS-CoV-2/FLU/RSV plus assay is intended as an aid in the diagnosis of influenza from Nasopharyngeal swab specimens and should not be used as a sole basis for treatment. Nasal washings and aspirates are unacceptable for Xpert Xpress SARS-CoV-2/FLU/RSV testing.  Fact Sheet for Patients: EntrepreneurPulse.com.au  Fact Sheet for Healthcare Providers: IncredibleEmployment.be  This test is not yet approved or cleared by the [place] FDA and has been authorized for detection and/or diagnosis of SARS-CoV-2 by FDA under an Emergency Use Authorization (EUA). This EUA will remain in effect (meaning this test can be used) for the duration of the COVID-19 declaration  under Section 564(b)(1) of the Act, 21 U.S.C. section 360bbb-3(b)(1), unless the authorization is terminated or revoked.     Resp Syncytial Virus by PCR NEGATIVE NEGATIVE Final    Comment: (NOTE) Fact Sheet for Patients: EntrepreneurPulse.com.au  Fact Sheet for Healthcare Providers: IncredibleEmployment.be  This test is not yet approved or cleared by the [place] FDA and has been authorized for detection and/or diagnosis of SARS-CoV-2 by FDA under an Emergency Use Authorization (EUA). This EUA will remain in effect (meaning this test can be used) for the duration of the COVID-19 declaration under Section 564(b)(1) of the Act, 21 U.S.C. section 360bbb-3(b)(1), unless the authorization is terminated or revoked.  Performed at [facility], [address] [place], [place] [address]     Coagulation Studies: Recent Labs    07/10/22 1630  LABPROT 14.1  INR 1.1    Urinalysis: Recent Labs    07/11/22 0127  COLORURINE STRAW*  LABSPEC 1.009  PHURINE 7.0  GLUCOSEU >=500*  HGBUR SMALL*  BILIRUBINUR NEGATIVE  KETONESUR NEGATIVE  PROTEINUR >=300*  NITRITE NEGATIVE  LEUKOCYTESUR NEGATIVE      Imaging: CT HEAD WO CONTRAST (5MM)  Result Date: 07/10/2022 CLINICAL DATA:  Chest pain and shortness of breath. EXAM: CT HEAD WITHOUT CONTRAST TECHNIQUE: Contiguous axial images were obtained from the base of the skull  through the vertex without intravenous contrast. RADIATION DOSE REDUCTION: This exam was performed according to the departmental dose-optimization program which includes automated exposure control, adjustment of the mA and/or kV according to patient size and/or use of iterative reconstruction technique. COMPARISON:  04/04/2022 FINDINGS: Brain: No evidence of acute infarction, hemorrhage, hydrocephalus, extra-axial collection or mass lesion/mass effect. Vascular: No hyperdense vessel or unexpected calcification. Skull: Normal.  Negative for fracture or focal lesion. Sinuses/Orbits: No acute finding. Other: Generalized fairly mild motion artifact IMPRESSION: Negative motion degraded head CT. Electronically Signed   By: Jorje Guild M.D.   On: 07/10/2022 06:43   DG Chest Port 1 View  Result Date: 07/10/2022 CLINICAL DATA:  Chest pain EXAM: PORTABLE CHEST 1 VIEW COMPARISON:  Three days ago FINDINGS: Worsening interstitial opacity with Kerley lines and fissure thickening. Chronic cardiopericardial enlargement with stable mediastinal contours including hilar vessel congestion. Dialysis catheter on the right in good position. No pneumothorax. IMPRESSION: Worsening pulmonary edema. Electronically Signed   By: Jorje Guild M.D.   On: 07/10/2022 04:44     Medications:    sodium chloride     dextrose 5% lactated ringers Stopped (07/10/22 1816)   heparin 850 Units/hr (07/11/22 0132)    aspirin EC  81 mg Oral Daily   carvedilol  25 mg Oral BID   Chlorhexidine Gluconate Cloth  6 each Topical Q0600   clopidogrel  75 mg Oral Daily   doxycycline  100 mg Oral BID   escitalopram  10 mg Oral QHS   feeding supplement (NEPRO CARB STEADY)  237 mL Oral TID BM   hydrALAZINE  100 mg Oral BID   insulin aspart  0-6 Units Subcutaneous TID WC   insulin detemir  15 Units Subcutaneous QHS   isosorbide mononitrate  60 mg Oral BID   losartan  100 mg Oral Daily   pantoprazole  20 mg Oral Daily   rosuvastatin  40 mg Oral Daily   sodium chloride flush  3 mL Intravenous Q12H   [START ON 07/15/2022] torsemide  20 mg Oral Once per day on Sun Sat   sodium chloride, acetaminophen, albuterol, dextrose, heparin, nitroGLYCERIN, ondansetron **OR** ondansetron (ZOFRAN) IV, sodium chloride flush  Assessment/ Plan:  Jimmy Olson is a 62 y.o.  male with past medical conditions including anemia, two-vessel CAD, diabetes, depression, DVT with Eliquis, and end-stage renal disease on hemodialysis.  Patient presents to the emergency department with  complaints of shortness of breath and chest pain. Patient has been admitted for Flash pulmonary edema ([place]) [J81.0] Hyperglycemia [R73.9] Acute respiratory failure with hypoxia ([place]) [J96.01] Acute hypoxic respiratory failure ([place]) [J96.01]  [facility]/TTS/right chest PermCath  End-stage renal disease on hemodialysis.  Received treatment yesterday, UF 1L achieved. Receiving dialysis today per outpatient schedule. Attempted UF 2-2.5L as tolerated. Next treatment scheduled for Thursday.   2. Anemia of chronic kidney disease Lab Results  Component Value Date   HGB 8.1 (L) 07/11/2022    Hgb below desired target. Patient receives [place] outpatient.   3. Secondary Hyperparathyroidism: with outpatient labs: PTH 387, phosphorus 5.1, calcium 9.1 on 06/29/22.   Lab Results  Component Value Date   CALCIUM 8.2 (L) 07/11/2022   CAION 1.19 05/26/2022   PHOS 3.5 07/11/2022    Bone minerals acceptable at this time.   4. Diabetes mellitus type II with chronic kidney disease/renal manifestations: insulin dependent. Home regimen includes NovoLog, Humulog, Levemir. Most recent hemoglobin A1c is 10.1 on 05/17/22   Glucose elevated at times.  Primary team to manage SSI.    LOS: 1 [place] 1/23/202412:00 PM

## 2022-07-11 NOTE — Progress Notes (Signed)
[facility] Progress Note  Patient: Jimmy Olson    VQX:[id number]  DOA: 07/10/2022    Date of Service: the patient was seen and examined on 07/11/2022  Brief hospital course: Patient is a 62 year old male with past medical history of diabetes mellitus type 2 on insulin, end-stage renal disease on hemodialysis, COPD and recent hospitalization in December for flash pulmonary edema and non-STEMI.  At that time, patient had cardiac cath and found to have heavily calcified LAD and ended up having a drug-eluting stent placement in follow-up at [facility] at the end of the month.  Nephrology and cardiology were consulted on admission.  Patient just discharged on 12/1 for volume overload and hypertensive emergency.  Stabilized with diuresis and dialysis.   Patient recently had been seen in the emergency department on 1/19 for shortness of breath and was treated with bronchitis.  Viral testing at that time was negative.  He was sent home and then on the night of 1/21 developed sudden onset shortness of breath and found to be in hypertensive urgency and hypoxic despite 4 L nasal cannula.  Patient started on nitroglycerin drip and chest x-ray concerning for pulmonary edema.  Patient admitted to hospitalist service for acute respiratory failure secondary to acute on chronic systolic/diastolic heart failure.  Initial blood sugars noted to be greater than 600 and put on insulin drip, but after several hours, sugars normalized.  Troponins elevated at 1700 and patient seen by cardiology who felt elevated troponin likely due to supply demand mismatch in the setting of volume overload, tachycardia and severe hyperglycemia given no ischemic changes seen on EKG.  Nephrology consulted and took patient for dialysis and by afternoon of 1/22, blood pressures normalized and by morning of 1/23, patient's oxygen saturations normal on room air.   Assessment and Plan: * Acute hypoxic respiratory failure (HCC)-resolved  as of 07/11/2022 Initially requiring high flow nasal cannula since he did not tolerate BiPAP.  With diuresis and volume removal, has much improved and almost fully resolved.  Breathing comfortably.  Acute on chronic combined systolic and diastolic CHF (congestive heart failure) (HCC)-resolved as of 07/11/2022 Most likely secondary to hypertensive urgency with flash pulmonary edema Patient's last known LVEF was 40 to 45% from a 2D echocardiogram which was done 11/23 .  With volume improved through dialysis and Lasix, back to baseline  Hyperosmolar hyperglycemic state (HHS) (HCC)-resolved as of 07/11/2022 Noted to have significant hyperglycemia upon arrival without evidence of metabolic acidosis.  Significant hyperglycemia possibly due to recent systemic steroid administration for acute COPD exacerbation.  Patient was placed on an insulin drip which has been discontinued since blood glucose has improved.  Continue long-acting insulin with sliding scale coverage  End-stage renal disease on hemodialysis [facility]) Dialysis days are T/TH/S Patient admitted to the hospital in respiratory distress and found to have pulmonary edema and received dialysis on 1/22 and 1/23.  Appreciate nephrology help    Hypertensive emergency-resolved as of 07/10/2022 Patient with hypertensive emergency and complications of flash pulmonary edema Started on a nitroglycerin drip with improvement in his blood pressure and this has since been discontinued.  Will resume patient's multiple antihypertensive medications  CAD S/P percutaneous coronary angioplasty History of coronary artery disease status post recent left heart cath with stent angioplasty.  Noted to have elevated troponin levels which may be secondary to demand ischemia from hypertensive emergency and acute CHF.  See below  Elevated troponin Seen by cardiology and elevated troponins felt to be perfusion mismatch.  Repeat echocardiogram ordered and unchanged, no further  cardiology intervention needed.  Echo is pending.  Troponins are trending downward.  COPD with acute exacerbation (HCC)-resolved as of 07/11/2022 Place patient on scheduled and as needed bronchodilator therapy Place patient on inhaled steroids Continue oxygen supplementation to maintain pulse oximetry greater than 92% Complete course of doxycycline prescribed during his last ER visit.  Hyponatremia Pseudohyponatremia to hyperglycemia as well as hypervolemia from CHF.  Improved with treatment of hyperglycemia  History of anemia due to CKD H&H is stable Monitor closely during this hospitalization       Body mass index is 23.48 kg/m.        Consultants: Cardiology Nephrology  Procedures: Dialysis Echocardiogram pending  Antimicrobials: None  Code Status: Full code   Subjective: Patient feeling little bit better, breathing easier  Objective: Vital signs were reviewed and unremarkable. Vitals:   07/11/22 1400 07/11/22 1500  BP: (!) 149/73 117/63  Pulse: 85 84  Resp: 17 13  Temp:    SpO2: 97% 94%    Intake/Output Summary (Last 24 hours) at 07/11/2022 1926 Last data filed at 07/11/2022 1155 Gross per 24 hour  Intake --  Output 2400 ml  Net -2400 ml   Filed Weights   07/10/22 0409 07/11/22 0752 07/11/22 1203  Weight: 69.6 kg 68.5 kg 66 kg   Body mass index is 23.48 kg/m.  Exam:  General: Alert and oriented x 2-3, no acute distress HEENT: Normocephalic, atraumatic, mucous membranes are moist Cardiovascular: Regular rate and rhythm, S1-S2, 2 out of 6 systolic ejection murmur Respiratory: Clear to auscultation bilaterally Abdomen: Soft, nontender, nondistended, positive bowel sounds Musculoskeletal: No clubbing or cyanosis, trace pitting edema Skin: No skin breaks, tears or lesions Psychiatry: Appropriate, no evidence of psychoses Neurology: No focal deficits  Data Reviewed: Sodium of 133, potassium 3.3.  Troponin pending  Disposition:  Inpatient     Anticipated discharge date: 1/23  -Repeat echocardiogram Family Communication: Will call daughter DVT Prophylaxis: Heparin infusion    Author: Annita Brod ,MD 07/11/2022 7:26 PM  To reach On-call, see care teams to locate the attending and reach out via [email or web address]. Between 7PM-7AM, please contact night-coverage If you still have difficulty reaching the attending provider, please page the [facility] (Director on Call) for [facility] on amion for assistance.

## 2022-07-11 NOTE — ED Notes (Signed)
Patient noted to have drops in oxygen saturations while sleeping.  Placed back on 2 L [place] at this time

## 2022-07-11 NOTE — Progress Notes (Signed)
PT did 3.5 hrs of HD, tolerated well with no signs of distress  UF = 2400 ml    07/11/22 1155  Vitals  Temp 98.5 F (36.9 C)  Temp Source Oral  BP 116/71  MAP (mmHg) 86  BP Location Right Arm  BP Method Automatic  Patient Position (if appropriate) Lying  Pulse Rate 83  Pulse Rate Source Monitor  ECG Heart Rate 82  Resp 20  Oxygen Therapy  SpO2 100 %  O2 Device Nasal Cannula  O2 Flow Rate (L/min) 2 L/min  Patient Activity (if Appropriate) In bed  Pulse Oximetry Type Continuous  During Treatment Monitoring  HD Safety Checks Performed Yes  Intra-Hemodialysis Comments Tx completed;Tolerated well  Post Treatment  Dialyzer Clearance Heavily streaked  Duration of HD Treatment -hour(s) 3.27 hour(s)  Hemodialysis Intake (mL) 0 mL  Liters Processed 71.1  Fluid Removed (mL) 2400 mL  Tolerated HD Treatment Yes  Hemodialysis Catheter Right Internal jugular Double lumen Permanent (Tunneled)  Placement Date/Time: 06/02/21 1429   Time Out: Correct patient;Correct site;Correct procedure  Maximum sterile barrier precautions: Hand hygiene;Cap;Mask;Sterile gown;Sterile gloves;Large sterile sheet  Site Prep: Chlorhexidine (preferred)  Local Anes...  Site Condition No complications  Blue Lumen Status Heparin locked  Red Lumen Status Heparin locked  Purple Lumen Status N/A  Catheter fill solution Heparin 1000 units/ml  Catheter fill volume (Arterial) 1.9 cc  Catheter fill volume (Venous) 1.9  Dressing Type Transparent  Dressing Status Antimicrobial disc in place;Clean, Dry, Intact  Drainage Description None  Post treatment catheter status Capped and Clamped

## 2022-07-11 NOTE — Consult Note (Signed)
ANTICOAGULATION CONSULT NOTE  Pharmacy Consult for heparin drip  Indication: ACS/STEMI  No Known Allergies  Patient Measurements: Height: 5\' 6"  (167.6 cm) Weight: 66 kg (145 lb 8.1 oz) IBW/kg (Calculated) : 63.8 Heparin Dosing Weight: 66 kg  Vital Signs: Temp: 98.5 F (36.9 C) (01/23 1155) Temp Source: Oral (01/23 1155) BP: 117/63 (01/23 1500) Pulse Rate: 84 (01/23 1500)  Labs: Recent Labs    07/10/22 0404 07/10/22 0615 07/10/22 0857 07/10/22 1449 07/10/22 1630 07/11/22 0127 07/11/22 0128 07/11/22 0623 07/11/22 1548  HGB 10.2*  --   --   --   --   --  8.1*  --   --   HCT 30.7*  --   --   --   --   --  24.1*  --   --   PLT 284  --   --   --   --   --  173  --   --   APTT  --   --   --   --  23*  --   --   --   --   LABPROT  --   --   --   --  14.1  --   --   --   --   INR  --   --   --   --  1.1  --   --   --   --   HEPARINUNFRC  --   --   --   --  <0.10* 0.36  --   --  0.26*  CREATININE 5.29*  --   --  2.80*  --   --   --  4.14*  --   TROPONINIHS 610* 644* 1,723* 6,193*  --   --   --   --   --      Estimated Creatinine Clearance: 17.3 mL/min (A) (by C-G formula based on SCr of 4.14 mg/dL (H)).   Medical History: Past Medical History:  Diagnosis Date   Anemia    Anxiety and depression    ATN (acute tubular necrosis) (HCC)    CAD (coronary artery disease)    a. 06/2020 Cath: Nonobs dzs; b. 05/2021 NSTEMI/Cath: Nonobs dzs->demand isch; c. 05/2022 Cath: LM mild dzs, LAD 40ost/p, 59m, D1 70, RI mild dzs, LCX small, mild dzs, RCA 20p, 46m/d, RPDA 40; d. 05/2022 Staged PCI: Orbital atherectomy of mLAD  (2.75x48 Synergy XD DES).   Chronic HFrEF (heart failure with reduced ejection fraction) ([place])    a. 08/2019 Echo: EF 45-50%; b. 06/2020 Echo: EF 30-35%; c. 04/2022 Echo: EF 40-45%, mild LVH, small apical region of noncompaction, GrII DD, nl RV fxn.   DVT of axillary vein, acute left ([place]) 05/2021   a.) Tx'd with apixaban   ESRD (end stage renal disease) on dialysis  [facility])    a.) M-W-F   History of 2019 novel coronavirus disease (COVID-19) 03/06/2020   a.) s/p Tx with monoclonal Ab infusion   HLD (hyperlipidemia)    Hyperkalemia 12/23/2020   Hypertension    Left carotid artery stenosis 08/18/2021   a.) Carotid Doppler 21/22/4825: 00-37% LICA stenosis   Long term current use of anticoagulant    a.) apixaban for DVT   Mixed Ischemic & Nonischemic Cardiomyopathy    a. 08/2019 Echo: EF 45-50%; b. 06/2020 Echo: EF 30-35%; c. 04/2022 Echo: EF 40-45%, mild LVH, small apical region of noncompaction, GrII DD, nl RV fxn.   NSTEMI (non-ST elevated myocardial infarction) ([name]) 07/15/2020  Sepsis ([place])    Suicidal ideations    TIA (transient ischemic attack) 06/2021   Type 2 diabetes mellitus treated with insulin (HCC)     Medications:  Patient on Eliquis in the past for upper extremity DVT.  Most recent clinic notes indicate that Eliquis was stopped.  Assessment: 62 yo male presented to the ED with complaint of shortness of breath.  Patient has history of CAD.  Found to have elevated troponin in the ED.   Baseline: aPTT and PT/INR pending  Goal of Therapy:  Heparin level 0.3-0.7 units/ml Monitor platelets by anticoagulation protocol: Yes  1/23 @ 0127 HL 0.36, therapeutic x 1 1/23 @ 1548 HL 0.26, subtherapeutic @ 850 units/hr   Plan: HL subtherapeutic Give heparin 1000 units IV x 1 Increase heparin drip rate to 950 unit/hr Check HL 8 hours after rate change Daily CBC while on heparin  Lorin Picket, PharmD 07/11/2022,4:17 PM

## 2022-07-11 NOTE — Progress Notes (Signed)
Cardiology Progress Note   Patient Name: Jimmy Olson Date of Encounter: 07/11/2022  Primary Cardiologist: Ida Rogue, MD  Subjective   Feels well this AM.  S/p HD.  No c/p.  Feels like lungs are congested, intermittently coughing.  Inpatient Medications    Scheduled Meds:  aspirin EC  81 mg Oral Daily   carvedilol  25 mg Oral BID   Chlorhexidine Gluconate Cloth  6 each Topical Q0600   clopidogrel  75 mg Oral Daily   doxycycline  100 mg Oral BID   escitalopram  10 mg Oral QHS   feeding supplement (NEPRO CARB STEADY)  237 mL Oral TID BM   hydrALAZINE  100 mg Oral BID   insulin aspart  0-6 Units Subcutaneous TID WC   insulin detemir  15 Units Subcutaneous QHS   isosorbide mononitrate  60 mg Oral BID   losartan  100 mg Oral Daily   pantoprazole  20 mg Oral Daily   rosuvastatin  40 mg Oral Daily   sodium chloride flush  3 mL Intravenous Q12H   [START ON 07/15/2022] torsemide  20 mg Oral Once per day on Sun Sat   Continuous Infusions:  sodium chloride     dextrose 5% lactated ringers Stopped (07/10/22 1816)   heparin 850 Units/hr (07/11/22 0132)   PRN Meds: sodium chloride, acetaminophen, albuterol, dextrose, nitroGLYCERIN, ondansetron **OR** ondansetron (ZOFRAN) IV, sodium chloride flush   Vital Signs    Vitals:   07/11/22 1100 07/11/22 1130 07/11/22 1155 07/11/22 1203  BP: 128/74 131/69 116/71   Pulse: 77 79 83   Resp: 14 13 20    Temp:   98.5 F (36.9 C)   TempSrc:   Oral   SpO2: 99% 99% 100%   Weight:    66 kg  Height:        Intake/Output Summary (Last 24 hours) at 07/11/2022 1234 Last data filed at 07/11/2022 1155 Gross per 24 hour  Intake --  Output 2400 ml  Net -2400 ml   Filed Weights   07/10/22 0409 07/11/22 0752 07/11/22 1203  Weight: 69.6 kg 68.5 kg 66 kg    Physical Exam   GEN: Well nourished, well developed, in no acute distress.  HEENT: Grossly normal.  Neck: Supple, no JVD, carotid bruits, or masses. Cardiac: RRR, no murmurs, rubs,  or gallops. No clubbing, cyanosis, edema.  Radials 2+, DP/PT 2+ and equal bilaterally.  Respiratory:  Respirations regular and unlabored, diminished breath sounds bilat. GI: Soft, nontender, nondistended, BS + x 4. MS: no deformity or atrophy. Skin: warm and dry, no rash. Neuro:  Strength and sensation are intact. Psych: AAOx3.  Normal affect.  Labs    Chemistry Recent Labs  Lab 07/07/22 2152 07/10/22 0404 07/10/22 0615 07/10/22 1449 07/11/22 0623  NA 133* 129*  --  130* 133*  K 4.9 5.1  --  3.1* 3.3*  CL 101 95*  --  94* 97*  CO2 23 21*  --  25 30  GLUCOSE 423* 972*  --  284* 147*  BUN 39* 58*  --  26* 35*  CREATININE 4.44* 5.29*  --  2.80* 4.14*  CALCIUM 8.9 9.0  --  8.2* 8.2*  PROT 7.2  --  7.1  --   --   ALBUMIN 3.4*  --  3.6  --   --   AST 32  --  39  --   --   ALT 16  --  27  --   --  ALKPHOS 97  --  101  --   --   BILITOT 1.1  --  0.8  --   --   GFRNONAA 14* 12*  --  25* 16*  ANIONGAP 9 13  --  11 6     Hematology Recent Labs  Lab 07/07/22 2152 07/10/22 0404 07/11/22 0128  WBC 5.6 13.9* 9.0  RBC 2.96* 3.28* 2.67*  HGB 9.0* 10.2* 8.1*  HCT 26.5* 30.7* 24.1*  MCV 89.5 93.6 90.3  MCH 30.4 31.1 30.3  MCHC 34.0 33.2 33.6  RDW 12.7 13.6 13.4  PLT 210 284 173    Cardiac Enzymes  Recent Labs  Lab 07/10/22 0404 07/10/22 0615 07/10/22 0857 07/10/22 1449  TROPONINIHS 610* 644* 1,723* 6,193*      BNP    Component Value Date/Time   BNP >4,500.0 (H) 07/10/2022 0404   Lipids  Lab Results  Component Value Date   CHOL 113 10/02/2021   HDL 34 (L) 10/02/2021   LDLCALC 45 10/02/2021   TRIG 170 (H) 10/02/2021   CHOLHDL 3.3 10/02/2021    HbA1c  Lab Results  Component Value Date   HGBA1C 10.1 (H) 05/17/2022    Radiology    CT HEAD WO CONTRAST (5MM)  Result Date: 07/10/2022 CLINICAL DATA:  Chest pain and shortness of breath. EXAM: CT HEAD WITHOUT CONTRAST TECHNIQUE: Contiguous axial images were obtained from the base of the skull through the  vertex without intravenous contrast. RADIATION DOSE REDUCTION: This exam was performed according to the departmental dose-optimization program which includes automated exposure control, adjustment of the mA and/or kV according to patient size and/or use of iterative reconstruction technique. COMPARISON:  04/04/2022 FINDINGS: Brain: No evidence of acute infarction, hemorrhage, hydrocephalus, extra-axial collection or mass lesion/mass effect. Vascular: No hyperdense vessel or unexpected calcification. Skull: Normal. Negative for fracture or focal lesion. Sinuses/Orbits: No acute finding. Other: Generalized fairly mild motion artifact IMPRESSION: Negative motion degraded head CT. Electronically Signed   By: Jorje Guild M.D.   On: 07/10/2022 06:43   DG Chest Port 1 View  Result Date: 07/10/2022 CLINICAL DATA:  Chest pain EXAM: PORTABLE CHEST 1 VIEW COMPARISON:  Three days ago FINDINGS: Worsening interstitial opacity with Kerley lines and fissure thickening. Chronic cardiopericardial enlargement with stable mediastinal contours including hilar vessel congestion. Dialysis catheter on the right in good position. No pneumothorax. IMPRESSION: Worsening pulmonary edema. Electronically Signed   By: Jorje Guild M.D.   On: 07/10/2022 04:44   DG Chest Portable 1 View  Result Date: 07/07/2022 CLINICAL DATA:  Chest pain with cough EXAM: PORTABLE CHEST 1 VIEW COMPARISON:  Chest x-ray 05/22/2022 FINDINGS: Right chest port catheter tip projects over the cavoatrial junction. The heart is enlarged, unchanged. There central pulmonary vascular congestion with interstitial prominence in the lung bases. There is no focal lung consolidation, pleural effusion or pneumothorax. No acute fractures are seen. IMPRESSION: Cardiomegaly with central pulmonary vascular congestion and interstitial prominence in the lung bases, likely mild pulmonary edema. Electronically Signed   By: Ronney Asters M.D.   On: 07/07/2022 22:10     Telemetry    RSR - Personally Reviewed  Cardiac Studies   2D Echocardiogram   04/2022 Echo: EF 40-45%, mild LVH, small apical region of noncompaction, GrII DD, nl RV fxn.   Cardiac Catheterization and Percutaneous Coronary Intervention   05/2022 Cath: LM mild dzs, LAD 40ost/p, 73m, D1 70, RI mild dzs, LCX small, mild dzs, RCA 20p, 21m/d, RPDA 40  05/2022 Staged PCI: Orbital atherectomy of  mLAD (2.75x48 Synergy XD DES).  _____________   Patient Profile     62 y.o. male with a history of coronary artery disease status post LAD stenting in December 2023, mixed ischemic and nonischemic cardiomyopathy with an EF of 40-45% November 2023, chronic HFrEF, hypertension, hyperlipidemia, type 2 diabetes mellitus, end-stage renal disease on hemodialysis, left upper extremity DVT previously on Eliquis, and TIA, who presented to [place] [facility] early on January 22 with acute on chronic hypoxic respiratory failure, volume overload, and supply:demand ischemia.  Assessment & Plan    1.  Acute hypoxic respiratory failure/acute on chronic heart failure with reduced ejection fraction/mixed ischemic and nonischemic cardiomyopathy: EF 40 to 45% by echo November 2023 with small apical region of noncompaction and grade 2 diastolic dysfunction.  Seen in the ED January 19 for dyspnea and treated for acute bronchitis with prednisone.  Chest x-ray at that time showed pulmonary vascular congestion and mild edema.  On the evening of January 21, he had abrupt onset of dyspnea and chest pain.  In the ED, saturations in the low 80s and at one point, he required high flow nasal cannula.  He underwent ultrafiltration with improvement in breathing.  Status post hemodialysis today.  Overall feels much better.  Still coughing some and feels that lungs are congested.  Echo pending. Cont ARB/? blocker. Volume mgmt per nephrology.  2.  Supply/demand ischemia; coronary artery disease: Known CAD status post recent atherectomy  and drug-eluting stent placement to the LAD in December 2023.  He reports compliance with dual antiplatelet therapy at home.  In the setting of #1, he did experience chest pain with troponin elevation to a peak of 6193.  Echo pending to reevaluate EF.  If significant decline, he will require relook catheterization - also had moderate ostial LAD and diagonal dzs.  Continue dual antiplatelet therapy, beta-blocker, and statin.  Continue heparin for total of 48 hours (end 1/24).  3.  Essential hypertension: Hypertensive on arrival with subsequent development of hypotension in the setting of ultrafiltration and IV nitroglycerin.  Pressure is now stable on beta-blocker, ARB, nitrate, and hydralazine.  4.  Hyperlipidemia: LDL 45 in April 2023.  Continue statin therapy.  5.  Type 2 diabetes mellitus/hyperglycemia: Glucose 972 on arrival in setting of prednisone therapy.  Glucose 147 this morning.  Insulin management per internal medicine team.  6.  End-stage renal disease: On hemodialysis, which she had this morning.  Nephrology following.  Signed, Murray Hodgkins, NP  07/11/2022, 12:34 PM    For questions or updates, please contact   Please consult [email or web address] for contact info under Cardiology/STEMI.

## 2022-07-11 NOTE — Discharge Planning (Signed)
[facility]  [address] [place],  [id number] [phone]  Scheduled Days Tuesday Thursday and Saturday   Treatment Time: 11:00am  Above schedule confirmed with Larene Beach AA.

## 2022-07-11 NOTE — Hospital Course (Signed)
Patient is a 62 year old male with past medical history of diabetes mellitus type 2 on insulin, end-stage renal disease on hemodialysis, COPD and recent hospitalization in December for flash pulmonary edema and non-STEMI.  At that time, patient had cardiac cath and found to have heavily calcified LAD and ended up having a drug-eluting stent placement in follow-up at [facility] at the end of the month.  Nephrology and cardiology were consulted on admission.  Patient just discharged on 12/1 for volume overload and hypertensive emergency.  Stabilized with diuresis and dialysis.   Patient recently had been seen in the emergency department on 1/19 for shortness of breath and was treated with bronchitis.  Viral testing at that time was negative.  He was sent home and then on the night of 1/21 developed sudden onset shortness of breath and found to be in hypertensive urgency and hypoxic despite 4 L nasal cannula.  Patient started on nitroglycerin drip and chest x-ray concerning for pulmonary edema.  Patient admitted to hospitalist service for acute respiratory failure secondary to acute on chronic systolic/diastolic heart failure.  Initial blood sugars noted to be greater than 600 and put on insulin drip, but after several hours, sugars normalized.  Troponins elevated at 1700 and patient seen by cardiology who felt elevated troponin likely due to supply demand mismatch in the setting of volume overload, tachycardia and severe hyperglycemia given no ischemic changes seen on EKG.  Nephrology consulted and took patient for dialysis and by afternoon of 1/22, blood pressures normalized and by morning of 1/23, patient's oxygen saturations normal on room air.

## 2022-07-11 NOTE — ED Notes (Signed)
Patient alert and oriented.  Unable to recall events from yesterday.  No reports of pain or discomfort at this time.  Provided with warm blankets for comfort

## 2022-07-11 NOTE — ED Notes (Signed)
Patient ambulatory to bedside commode.  Steady gait noted.  Trial off oxygen and patient able to maintain saturations between 93-96%.  Will continue to monitor

## 2022-07-12 DIAGNOSIS — I214 Non-ST elevation (NSTEMI) myocardial infarction: Secondary | ICD-10-CM | POA: Diagnosis not present

## 2022-07-12 DIAGNOSIS — I255 Ischemic cardiomyopathy: Secondary | ICD-10-CM | POA: Diagnosis not present

## 2022-07-12 DIAGNOSIS — J9601 Acute respiratory failure with hypoxia: Secondary | ICD-10-CM | POA: Diagnosis present

## 2022-07-12 DIAGNOSIS — J9602 Acute respiratory failure with hypercapnia: Secondary | ICD-10-CM | POA: Diagnosis present

## 2022-07-12 DIAGNOSIS — J9612 Chronic respiratory failure with hypercapnia: Secondary | ICD-10-CM | POA: Diagnosis present

## 2022-07-12 LAB — HEPARIN LEVEL (UNFRACTIONATED)
Heparin Unfractionated: 0.27 IU/mL — ABNORMAL LOW (ref 0.30–0.70)
Heparin Unfractionated: 0.41 IU/mL (ref 0.30–0.70)
Heparin Unfractionated: 0.37 IU/mL (ref 0.30–0.70)

## 2022-07-12 LAB — CBG MONITORING, ED
Glucose-Capillary: 199 mg/dL — ABNORMAL HIGH (ref 70–99)
Glucose-Capillary: 263 mg/dL — ABNORMAL HIGH (ref 70–99)
Glucose-Capillary: 268 mg/dL — ABNORMAL HIGH (ref 70–99)
Glucose-Capillary: 222 mg/dL — ABNORMAL HIGH (ref 70–99)

## 2022-07-12 LAB — CBC
HCT: 25.3 % — ABNORMAL LOW (ref 39.0–52.0)
Hemoglobin: 8.5 g/dL — ABNORMAL LOW (ref 13.0–17.0)
MCH: 30.7 pg (ref 26.0–34.0)
MCHC: 33.6 g/dL (ref 30.0–36.0)
MCV: 91.3 fL (ref 80.0–100.0)
Platelets: 155 10*3/uL (ref 150–400)
RBC: 2.77 MIL/uL — ABNORMAL LOW (ref 4.22–5.81)
RDW: 13.1 % (ref 11.5–15.5)
WBC: 5.4 10*3/uL (ref 4.0–10.5)
nRBC: 0 % (ref 0.0–0.2)

## 2022-07-12 LAB — ECHOCARDIOGRAM COMPLETE
Area-P 1/2: 3.78 cm2
Height: 66 in
S' Lateral: 4 cm
Weight: 2328.06 oz

## 2022-07-12 LAB — TROPONIN I (HIGH SENSITIVITY): Troponin I (High Sensitivity): 7911 ng/L (ref <18)

## 2022-07-12 MED ORDER — HYDRALAZINE HCL 50 MG PO TABS
100.0000 mg | ORAL_TABLET | Freq: Three times a day (TID) | ORAL | Status: DC
  Administered 2022-07-12 – 2022-07-15 (×8): 100 mg via ORAL
  Filled 2022-07-12 (×8): qty 2

## 2022-07-12 MED ORDER — SODIUM CHLORIDE 0.9% FLUSH
3.0000 mL | Freq: Two times a day (BID) | INTRAVENOUS | Status: DC
  Administered 2022-07-12 – 2022-07-13 (×3): 3 mL via INTRAVENOUS

## 2022-07-12 MED ORDER — HEPARIN BOLUS VIA INFUSION
1000.0000 [IU] | Freq: Once | INTRAVENOUS | Status: AC
  Administered 2022-07-12: 1000 [IU] via INTRAVENOUS
  Filled 2022-07-12: qty 1000

## 2022-07-12 MED ORDER — GUAIFENESIN-DM 100-10 MG/5ML PO SYRP
5.0000 mL | ORAL_SOLUTION | ORAL | Status: DC | PRN
  Administered 2022-07-12: 5 mL via ORAL
  Filled 2022-07-12: qty 10

## 2022-07-12 NOTE — ED Notes (Signed)
Patient provided with PRN medication for cough.  Did not want insulin at this time.  Offered to take dinner tray and patient wanted it to remain at bedside.  Comfort measures offered and patient stated he was "fine."  Will continue to monitor

## 2022-07-12 NOTE — Consult Note (Signed)
ANTICOAGULATION CONSULT NOTE  Pharmacy Consult for heparin drip  Indication: ACS/STEMI  No Known Allergies  Patient Measurements: Height: 5\' 6"  (167.6 cm) Weight: 66 kg (145 lb 8.1 oz) IBW/kg (Calculated) : 63.8 Heparin Dosing Weight: 66 kg  Vital Signs: Temp: 98.2 F (36.8 C) (01/24 0854) Temp Source: Oral (01/24 0854) BP: 152/74 (01/24 1743) Pulse Rate: 80 (01/24 1743)  Labs: Recent Labs    07/10/22 0404 07/10/22 0615 07/10/22 1449 07/10/22 1630 07/11/22 0127 07/11/22 0128 07/11/22 0623 07/11/22 1548 07/11/22 1928 07/12/22 0157 07/12/22 0512 07/12/22 1050 07/12/22 1850  HGB 10.2*  --   --   --   --  8.1*  --   --   --   --  8.5*  --   --   HCT 30.7*  --   --   --   --  24.1*  --   --   --   --  25.3*  --   --   PLT 284  --   --   --   --  173  --   --   --   --  155  --   --   APTT  --   --   --  23*  --   --   --   --   --   --   --   --   --   LABPROT  --   --   --  14.1  --   --   --   --   --   --   --   --   --   INR  --   --   --  1.1  --   --   --   --   --   --   --   --   --   HEPARINUNFRC  --   --   --  <0.10*   < >  --   --    < >  --  0.27*  --  0.41 0.37  CREATININE 5.29*  --  2.80*  --   --   --  4.14*  --   --   --   --   --   --   TROPONINIHS 610*   < > 6,193*  --   --   --   --   --  9,102*  --  7,911*  --   --    < > = values in this interval not displayed.     Estimated Creatinine Clearance: 17.3 mL/min (A) (by C-G formula based on SCr of 4.14 mg/dL (H)).   Medical History: Past Medical History:  Diagnosis Date   Anemia    Anxiety and depression    ATN (acute tubular necrosis) (HCC)    CAD (coronary artery disease)    a. 06/2020 Cath: Nonobs dzs; b. 05/2021 NSTEMI/Cath: Nonobs dzs->demand isch; c. 05/2022 Cath: LM mild dzs, LAD 40ost/p, 40m, D1 70, RI mild dzs, LCX small, mild dzs, RCA 20p, 80m/d, RPDA 40; d. 05/2022 Staged PCI: Orbital atherectomy of mLAD  (2.75x48 Synergy XD DES).   Chronic HFrEF (heart failure with reduced ejection  fraction) ([place])    a. 08/2019 Echo: EF 45-50%; b. 06/2020 Echo: EF 30-35%; c. 04/2022 Echo: EF 40-45%, mild LVH, small apical region of noncompaction, GrII DD, nl RV fxn.   DVT of axillary vein, acute left ([place]) 05/2021   a.) Tx'd with apixaban   ESRD (end  stage renal disease) on dialysis [facility])    a.) M-W-F   History of 2019 novel coronavirus disease (COVID-19) 03/06/2020   a.) s/p Tx with monoclonal Ab infusion   HLD (hyperlipidemia)    Hyperkalemia 12/23/2020   Hypertension    Left carotid artery stenosis 08/18/2021   a.) Carotid Doppler 80/99/8338: 25-05% LICA stenosis   Long term current use of anticoagulant    a.) apixaban for DVT   Mixed Ischemic & Nonischemic Cardiomyopathy    a. 08/2019 Echo: EF 45-50%; b. 06/2020 Echo: EF 30-35%; c. 04/2022 Echo: EF 40-45%, mild LVH, small apical region of noncompaction, GrII DD, nl RV fxn.   NSTEMI (non-ST elevated myocardial infarction) ([place]) 07/15/2020   Sepsis ([place])    Suicidal ideations    TIA (transient ischemic attack) 06/2021   Type 2 diabetes mellitus treated with insulin (HCC)     Medications:  Patient on Eliquis in the past for upper extremity DVT.  Most recent clinic notes indicate that Eliquis was stopped.  Assessment: 62 yo male presented to the ED with complaint of shortness of breath.  Patient has history of CAD.  Found to have elevated troponin in the ED.   Baseline: aPTT and PT/INR pending  Goal of Therapy:  Heparin level 0.3-0.7 units/ml Monitor platelets by anticoagulation protocol: Yes  1/23 @ 0127 HL 0.36, therapeutic x 1 1/23 @ 1548 HL 0.26, subtherapeutic @ 850 units/hr 1/24 0157 HL 0.27, subtherapeutic 1/24 @ 1050 HL 0.41, therapeutic x 1 1/24 @1850  HL 0.37  Plan: Heparin remains therapeutic  Continue heparin drip at 1100 units/hr Recheck HL and CBC with AM labs  Dorothe Pea, PharmD, BCPS Clinical Pharmacist   07/12/2022 7:24 PM

## 2022-07-12 NOTE — ED Notes (Signed)
Informed RN bed assigned 

## 2022-07-12 NOTE — H&P (View-Only) (Signed)
Cardiology Progress Note   Patient Name: Jimmy Olson Date of Encounter: 07/12/2022  Primary Cardiologist: Ida Rogue, MD  Subjective   Feels nasal congestion. Breathing stable, but not back to baseline.  Has had some blood-tinged secretions or blowing his nose.  No chest pain.  Discussed prelim echo findings  EF sl worse.  Will plan on cath tomorrow - pt/family agreeable.  Inpatient Medications    Scheduled Meds:  aspirin EC  81 mg Oral Daily   carvedilol  25 mg Oral BID   Chlorhexidine Gluconate Cloth  6 each Topical Q0600   clopidogrel  75 mg Oral Daily   doxycycline  100 mg Oral BID   escitalopram  10 mg Oral QHS   feeding supplement (NEPRO CARB STEADY)  237 mL Oral TID BM   hydrALAZINE  100 mg Oral TID   insulin aspart  0-6 Units Subcutaneous TID WC   insulin detemir  15 Units Subcutaneous QHS   isosorbide mononitrate  60 mg Oral BID   losartan  100 mg Oral Daily   pantoprazole  20 mg Oral Daily   rosuvastatin  40 mg Oral Daily   sodium chloride flush  3 mL Intravenous Q12H   [START ON 07/15/2022] torsemide  20 mg Oral Once per day on Sun Sat   Continuous Infusions:  sodium chloride     dextrose 5% lactated ringers Stopped (07/10/22 1816)   heparin 1,100 Units/hr (07/12/22 0248)   PRN Meds: sodium chloride, acetaminophen, albuterol, dextrose, nitroGLYCERIN, ondansetron **OR** ondansetron (ZOFRAN) IV, sodium chloride flush   Vital Signs    Vitals:   07/12/22 0300 07/12/22 0400 07/12/22 0500 07/12/22 0854  BP: (!) 154/73 (!) 142/76 (!) 169/90 (!) 151/80  Pulse: 81 80 84 83  Resp: 15 (!) 21 20 18   Temp:    98.2 F (36.8 C)  TempSrc:    Oral  SpO2: 96% 96% 97% 96%  Weight:      Height:        Intake/Output Summary (Last 24 hours) at 07/12/2022 1036 Last data filed at 07/12/2022 0858 Gross per 24 hour  Intake --  Output 3125 ml  Net -3125 ml   Filed Weights   07/10/22 0409 07/11/22 0752 07/11/22 1203  Weight: 69.6 kg 68.5 kg 66 kg    Physical  Exam   GEN: Well nourished, well developed, in no acute distress.  HEENT: Grossly normal.  Neck: Supple, no JVD, carotid bruits, or masses. Cardiac: RRR, no murmurs, rubs, or gallops. No clubbing, cyanosis, edema.  Radials 2+, DP/PT 2+ and equal bilaterally.  Respiratory:  Respirations regular and unlabored, clear to auscultation bilaterally. GI: Soft, nontender, nondistended, BS + x 4. MS: no deformity or atrophy. Skin: warm and dry, no rash. Neuro:  Strength and sensation are intact. Psych: AAOx3.  Normal affect.  Labs    Chemistry Recent Labs  Lab 07/07/22 2152 07/10/22 0404 07/10/22 0615 07/10/22 1449 07/11/22 0623  NA 133* 129*  --  130* 133*  K 4.9 5.1  --  3.1* 3.3*  CL 101 95*  --  94* 97*  CO2 23 21*  --  25 30  GLUCOSE 423* 972*  --  284* 147*  BUN 39* 58*  --  26* 35*  CREATININE 4.44* 5.29*  --  2.80* 4.14*  CALCIUM 8.9 9.0  --  8.2* 8.2*  PROT 7.2  --  7.1  --   --   ALBUMIN 3.4*  --  3.6  --   --  AST 32  --  39  --   --   ALT 16  --  27  --   --   ALKPHOS 97  --  101  --   --   BILITOT 1.1  --  0.8  --   --   GFRNONAA 14* 12*  --  25* 16*  ANIONGAP 9 13  --  11 6     Hematology Recent Labs  Lab 07/10/22 0404 07/11/22 0128 07/12/22 0512  WBC 13.9* 9.0 5.4  RBC 3.28* 2.67* 2.77*  HGB 10.2* 8.1* 8.5*  HCT 30.7* 24.1* 25.3*  MCV 93.6 90.3 91.3  MCH 31.1 30.3 30.7  MCHC 33.2 33.6 33.6  RDW 13.6 13.4 13.1  PLT 284 173 155    Cardiac Enzymes  Recent Labs  Lab 07/10/22 0615 07/10/22 0857 07/10/22 1449 07/11/22 1928 07/12/22 0512  TROPONINIHS 644* 1,723* 6,193* 9,102* 7,911*      BNP    Component Value Date/Time   BNP >4,500.0 (H) 07/10/2022 0404   Lipids  Lab Results  Component Value Date   CHOL 113 10/02/2021   HDL 34 (L) 10/02/2021   LDLCALC 45 10/02/2021   TRIG 170 (H) 10/02/2021   CHOLHDL 3.3 10/02/2021    HbA1c  Lab Results  Component Value Date   HGBA1C 10.1 (H) 05/17/2022    Radiology    CT HEAD WO CONTRAST  (5MM)  Result Date: 07/10/2022 CLINICAL DATA:  Chest pain and shortness of breath. EXAM: CT HEAD WITHOUT CONTRAST TECHNIQUE: Contiguous axial images were obtained from the base of the skull through the vertex without intravenous contrast. RADIATION DOSE REDUCTION: This exam was performed according to the departmental dose-optimization program which includes automated exposure control, adjustment of the mA and/or kV according to patient size and/or use of iterative reconstruction technique. COMPARISON:  04/04/2022 FINDINGS: Brain: No evidence of acute infarction, hemorrhage, hydrocephalus, extra-axial collection or mass lesion/mass effect. Vascular: No hyperdense vessel or unexpected calcification. Skull: Normal. Negative for fracture or focal lesion. Sinuses/Orbits: No acute finding. Other: Generalized fairly mild motion artifact IMPRESSION: Negative motion degraded head CT. Electronically Signed   By: Jorje Guild M.D.   On: 07/10/2022 06:43   DG Chest Port 1 View  Result Date: 07/10/2022 CLINICAL DATA:  Chest pain EXAM: PORTABLE CHEST 1 VIEW COMPARISON:  Three days ago FINDINGS: Worsening interstitial opacity with Kerley lines and fissure thickening. Chronic cardiopericardial enlargement with stable mediastinal contours including hilar vessel congestion. Dialysis catheter on the right in good position. No pneumothorax. IMPRESSION: Worsening pulmonary edema. Electronically Signed   By: Jorje Guild M.D.   On: 07/10/2022 04:44    Telemetry    RSR - Personally Reviewed  Cardiac Studies   2D Echocardiogram    04/2022 Echo: EF 40-45%, mild LVH, small apical region of noncompaction, GrII DD, nl RV fxn.    Cardiac Catheterization and Percutaneous Coronary Intervention    05/2022 Cath: LM mild dzs, LAD 40ost/p, 56m, D1 70, RI mild dzs, LCX small, mild dzs, RCA 20p, 64m/d, RPDA 40   05/2022 Staged PCI: Orbital atherectomy of mLAD (2.75x48 Synergy XD DES).  _____________   Patient Profile      62 y.o. male with a history of coronary artery disease status post LAD stenting in December 2023, mixed ischemic and nonischemic cardiomyopathy with an EF of 40-45% November 2023, chronic HFrEF, hypertension, hyperlipidemia, type 2 diabetes mellitus, end-stage renal disease on hemodialysis, left upper extremity DVT previously on Eliquis, and TIA, who presented to [facility]  regional early on January 22 with acute on chronic hypoxic respiratory failure, volume overload, and nstemi.  Assessment & Plan    1.  Acute hypoxic respiratory failure/acute on chronic heart failure with reduced ejection fraction/mixed ischemic and nonischemic cardiomyopathy: EF 40 to 45% by echo November 2023 with small apical region of noncompaction and grade 2 diastolic dysfunction.  Seen in the ED January 19 for dyspnea and treated for acute bronchitis with prednisone.  Chest x-ray at that time showed pulmonary vascular congestion and mild edema.  On the evening of January 21, he had abrupt onset of dyspnea and chest pain.  In the ED, saturations in the low 80s and at one point, he required high flow nasal cannula.   No recurrent c/p. Tolerated HD yesterday.  Feels nasal congestion, breathing not quite back to baseline.  EF worse on echo - plan for cath tomorrow.  Cont ? blocker and arb.  Volume mgmt per nephrology w/ plan for HD tomorrow.  2.  NSTEMI/CAD:  Known CAD status post recent atherectomy and drug-eluting stent placement to the LAD in December 2023.  Residual moderate diag and ostial LAD dzs.  In setting of #1, he did experience chest pain and HsTrop peaked @ 9102.  Now down-trending.  No recurrent chest pain.  Echo performed late yesterday and reviewed by Dr. Garen Lah this morning.  Further decline in LV function, now 35%.  In that setting, patient will require diagnostic catheterization.  Discussed with patient and family today.  The patient understands that risks include but are not limited to stroke (1 in 1000), death  (1 in 67), kidney failure [usually temporary] (1 in 500), bleeding (1 in 200), allergic reaction [possibly serious] (1 in 200), and agrees to proceed.  We will plan for catheterization tomorrow with Dr. Ellyn Hack.  Continue dual antiplatelet therapy, beta-blocker, statin, and heparin.  3.  Essential hypertension: Blood pressure higher today in the 150s to 160s.  Was better yesterday following dialysis.  Continue beta-blocker, hydralazine, nitrate, and ARB therapy.  4.  Hyperlipidemia: LDL 45 in April 2023.  Continue statin therapy  5.  Type 2 diabetes mellitus/hyperglycemia: Glucose 972 on arrival in the setting of prednisone therapy.  Insulin management per internal medicine.  6.  End stage renal disease: Plan for hemodialysis tomorrow.  Nephrology following.  7.  Normocytic anemia:  stable.  Signed, Murray Hodgkins, NP  07/12/2022, 10:36 AM    For questions or updates, please contact   Please consult [email or web address] for contact info under Cardiology/STEMI.

## 2022-07-12 NOTE — ED Notes (Signed)
The pt is lying supine in the bed with his head slightly elevated. The pt was warm, pink, and dry. The pt was alert to all questions and advised he had a nose bleed during the night. Bedding was changed due to nosebleed. Bleeding stopped in right nostril.

## 2022-07-12 NOTE — Consult Note (Signed)
ANTICOAGULATION CONSULT NOTE  Pharmacy Consult for heparin drip  Indication: ACS/STEMI  No Known Allergies  Patient Measurements: Height: 5\' 6"  (167.6 cm) Weight: 66 kg (145 lb 8.1 oz) IBW/kg (Calculated) : 63.8 Heparin Dosing Weight: 66 kg  Vital Signs: Temp: 98 F (36.7 C) (01/23 2253) Temp Source: Oral (01/23 2253) BP: 144/76 (01/24 0200) Pulse Rate: 84 (01/24 0200)  Labs: Recent Labs    07/10/22 0404 07/10/22 0615 07/10/22 0857 07/10/22 1449 07/10/22 1630 07/10/22 1630 07/11/22 0127 07/11/22 0128 07/11/22 0623 07/11/22 1548 07/11/22 1928 07/12/22 0157  HGB 10.2*  --   --   --   --   --   --  8.1*  --   --   --   --   HCT 30.7*  --   --   --   --   --   --  24.1*  --   --   --   --   PLT 284  --   --   --   --   --   --  173  --   --   --   --   APTT  --   --   --   --  23*  --   --   --   --   --   --   --   LABPROT  --   --   --   --  14.1  --   --   --   --   --   --   --   INR  --   --   --   --  1.1  --   --   --   --   --   --   --   HEPARINUNFRC  --   --   --   --  <0.10*   < > 0.36  --   --  0.26*  --  0.27*  CREATININE 5.29*  --   --  2.80*  --   --   --   --  4.14*  --   --   --   TROPONINIHS 610*   < > 1,723* 6,193*  --   --   --   --   --   --  9,102*  --    < > = values in this interval not displayed.     Estimated Creatinine Clearance: 17.3 mL/min (A) (by C-G formula based on SCr of 4.14 mg/dL (H)).   Medical History: Past Medical History:  Diagnosis Date   Anemia    Anxiety and depression    ATN (acute tubular necrosis) (HCC)    CAD (coronary artery disease)    a. 06/2020 Cath: Nonobs dzs; b. 05/2021 NSTEMI/Cath: Nonobs dzs->demand isch; c. 05/2022 Cath: LM mild dzs, LAD 40ost/p, 15m, D1 70, RI mild dzs, LCX small, mild dzs, RCA 20p, 50m/d, RPDA 40; d. 05/2022 Staged PCI: Orbital atherectomy of mLAD  (2.75x48 Synergy XD DES).   Chronic HFrEF (heart failure with reduced ejection fraction) ([place])    a. 08/2019 Echo: EF 45-50%; b. 06/2020 Echo:  EF 30-35%; c. 04/2022 Echo: EF 40-45%, mild LVH, small apical region of noncompaction, GrII DD, nl RV fxn.   DVT of axillary vein, acute left ([place]) 05/2021   a.) Tx'd with apixaban   ESRD (end stage renal disease) on dialysis [facility])    a.) M-W-F   History of 2019 novel coronavirus disease (COVID-19) 03/06/2020   a.)  s/p Tx with monoclonal Ab infusion   HLD (hyperlipidemia)    Hyperkalemia 12/23/2020   Hypertension    Left carotid artery stenosis 08/18/2021   a.) Carotid Doppler 31/49/7026: 37-85% LICA stenosis   Long term current use of anticoagulant    a.) apixaban for DVT   Mixed Ischemic & Nonischemic Cardiomyopathy    a. 08/2019 Echo: EF 45-50%; b. 06/2020 Echo: EF 30-35%; c. 04/2022 Echo: EF 40-45%, mild LVH, small apical region of noncompaction, GrII DD, nl RV fxn.   NSTEMI (non-ST elevated myocardial infarction) ([place]) 07/15/2020   Sepsis ([place])    Suicidal ideations    TIA (transient ischemic attack) 06/2021   Type 2 diabetes mellitus treated with insulin (HCC)     Medications:  Patient on Eliquis in the past for upper extremity DVT.  Most recent clinic notes indicate that Eliquis was stopped.  Assessment: 62 yo male presented to the ED with complaint of shortness of breath.  Patient has history of CAD.  Found to have elevated troponin in the ED.   Baseline: aPTT and PT/INR pending  Goal of Therapy:  Heparin level 0.3-0.7 units/ml Monitor platelets by anticoagulation protocol: Yes  1/23 @ 0127 HL 0.36, therapeutic x 1 1/23 @ 1548 HL 0.26, subtherapeutic @ 850 units/hr 1/24 0157 HL 0.27, subtherapeutic   Plan: Give heparin 1000 units IV x 1 Increase heparin drip rate to 1000 unit/hr Recheck HL 8 hours after rate change Daily CBC while on heparin  Renda Rolls, PharmD, [facility] 07/12/2022 2:41 AM

## 2022-07-12 NOTE — Progress Notes (Signed)
[facility]  ROUNDING NOTE   Subjective:   Jimmy Olson  is a 62 year old Hispanic male with past medical conditions including anemia, two-vessel CAD, diabetes, depression, DVT with Eliquis, and end-stage renal disease on hemodialysis.  Patient presents to the emergency department with complaints of shortness of breath and chest pain. Patient has been admitted for Flash pulmonary edema ([place]) [J81.0] Hyperglycemia [R73.9] Acute respiratory failure with hypoxia ([place]) [J96.01] Acute hypoxic respiratory failure ([place]) [J96.01]  Patient is known to our practice and receives outpatient dialysis treatments at [facility] on a TTS schedule, supervised by Dr. Candiss Norse.    Patient seen resting in bed, family at bedside Denies pain or discomfort Remains on room air No lower extremity edema   Objective:  Vital signs in last 24 hours:  Temp:  [98 F (36.7 C)-98.3 F (36.8 C)] 98.2 F (36.8 C) (01/24 0854) Pulse Rate:  [80-85] 85 (01/24 1100) Resp:  [13-21] 16 (01/24 1100) BP: (117-169)/(63-96) 169/86 (01/24 1100) SpO2:  [94 %-97 %] 97 % (01/24 1100)  Weight change:  Filed Weights   07/10/22 0409 07/11/22 0752 07/11/22 1203  Weight: 69.6 kg 68.5 kg 66 kg    Intake/Output: I/O last 3 completed shifts: In: -  Out: 2900 [Urine:500; Other:2400]   Intake/Output this shift:  Total I/O In: -  Out: 225 [Urine:225]  Physical Exam: General: NAD, resting quietly  Head: Normocephalic, atraumatic. Moist oral mucosal membranes  Eyes: Anicteric  Lungs:  Clear to auscultation, normal effort  Heart: Regular rate and rhythm  Abdomen:  Soft, nontender, nondistended  Extremities: No peripheral edema.  Neurologic: Alert and oriented, moving all four extremities  Skin: No lesions  Access: Right IJ PermCath    Basic Metabolic Panel: Recent Labs  Lab 07/07/22 2152 07/10/22 0404 07/10/22 1449 07/11/22 0623  NA 133* 129* 130* 133*  K 4.9 5.1 3.1* 3.3*  CL 101 95* 94*  97*  CO2 23 21* 25 30  GLUCOSE 423* 972* 284* 147*  BUN 39* 58* 26* 35*  CREATININE 4.44* 5.29* 2.80* 4.14*  CALCIUM 8.9 9.0 8.2* 8.2*  PHOS  --   --   --  3.5     Liver Function Tests: Recent Labs  Lab 07/07/22 2152 07/10/22 0615  AST 32 39  ALT 16 27  ALKPHOS 97 101  BILITOT 1.1 0.8  PROT 7.2 7.1  ALBUMIN 3.4* 3.6    No results for input(s): "LIPASE", "AMYLASE" in the last 168 hours. No results for input(s): "AMMONIA" in the last 168 hours.  CBC: Recent Labs  Lab 07/07/22 2152 07/10/22 0404 07/11/22 0128 07/12/22 0512  WBC 5.6 13.9* 9.0 5.4  NEUTROABS 3.7  --   --   --   HGB 9.0* 10.2* 8.1* 8.5*  HCT 26.5* 30.7* 24.1* 25.3*  MCV 89.5 93.6 90.3 91.3  PLT 210 284 173 155     Cardiac Enzymes: No results for input(s): "CKTOTAL", "CKMB", "CKMBINDEX", "TROPONINI" in the last 168 hours.  BNP: Invalid input(s): "POCBNP"  CBG: Recent Labs  Lab 07/11/22 1310 07/11/22 1630 07/11/22 1934 07/12/22 0814 07/12/22 1154  GLUCAP 179* 264* 226* 199* 60*     Microbiology: Results for orders placed or performed during the hospital encounter of 07/10/22  Resp panel by RT-PCR (RSV, Flu A&B, Covid) Anterior Nasal Swab     Status: None   Collection Time: 07/10/22  6:49 AM   Specimen: Anterior Nasal Swab  Result Value Ref Range Status   SARS Coronavirus 2 by RT  PCR NEGATIVE NEGATIVE Final    Comment: (NOTE) SARS-CoV-2 target nucleic acids are NOT DETECTED.  The SARS-CoV-2 RNA is generally detectable in upper respiratory specimens during the acute phase of infection. The lowest concentration of SARS-CoV-2 viral copies this assay can detect is 138 copies/mL. A negative result does not preclude SARS-Cov-2 infection and should not be used as the sole basis for treatment or other patient management decisions. A negative result may occur with  improper specimen collection/handling, submission of specimen other than nasopharyngeal swab, presence of viral mutation(s)  within the areas targeted by this assay, and inadequate number of viral copies(<138 copies/mL). A negative result must be combined with clinical observations, patient history, and epidemiological information. The expected result is Negative.  Fact Sheet for Patients:  EntrepreneurPulse.com.au  Fact Sheet for Healthcare Providers:  IncredibleEmployment.be  This test is no t yet approved or cleared by the [place] FDA and  has been authorized for detection and/or diagnosis of SARS-CoV-2 by FDA under an Emergency Use Authorization (EUA). This EUA will remain  in effect (meaning this test can be used) for the duration of the COVID-19 declaration under Section 564(b)(1) of the Act, 21 U.S.C.section 360bbb-3(b)(1), unless the authorization is terminated  or revoked sooner.       Influenza A by PCR NEGATIVE NEGATIVE Final   Influenza B by PCR NEGATIVE NEGATIVE Final    Comment: (NOTE) The Xpert Xpress SARS-CoV-2/FLU/RSV plus assay is intended as an aid in the diagnosis of influenza from Nasopharyngeal swab specimens and should not be used as a sole basis for treatment. Nasal washings and aspirates are unacceptable for Xpert Xpress SARS-CoV-2/FLU/RSV testing.  Fact Sheet for Patients: EntrepreneurPulse.com.au  Fact Sheet for Healthcare Providers: IncredibleEmployment.be  This test is not yet approved or cleared by the [place] FDA and has been authorized for detection and/or diagnosis of SARS-CoV-2 by FDA under an Emergency Use Authorization (EUA). This EUA will remain in effect (meaning this test can be used) for the duration of the COVID-19 declaration under Section 564(b)(1) of the Act, 21 U.S.C. section 360bbb-3(b)(1), unless the authorization is terminated or revoked.     Resp Syncytial Virus by PCR NEGATIVE NEGATIVE Final    Comment: (NOTE) Fact Sheet for  Patients: EntrepreneurPulse.com.au  Fact Sheet for Healthcare Providers: IncredibleEmployment.be  This test is not yet approved or cleared by the [place] FDA and has been authorized for detection and/or diagnosis of SARS-CoV-2 by FDA under an Emergency Use Authorization (EUA). This EUA will remain in effect (meaning this test can be used) for the duration of the COVID-19 declaration under Section 564(b)(1) of the Act, 21 U.S.C. section 360bbb-3(b)(1), unless the authorization is terminated or revoked.  Performed at [facility], [address] [place], [place] [address]     Coagulation Studies: Recent Labs    07/10/22 1630  LABPROT 14.1  INR 1.1     Urinalysis: Recent Labs    07/11/22 0127  COLORURINE STRAW*  LABSPEC 1.009  PHURINE 7.0  GLUCOSEU >=500*  HGBUR SMALL*  BILIRUBINUR NEGATIVE  KETONESUR NEGATIVE  PROTEINUR >=300*  NITRITE NEGATIVE  LEUKOCYTESUR NEGATIVE       Imaging: ECHOCARDIOGRAM COMPLETE  Result Date: 07/12/2022    ECHOCARDIOGRAM REPORT   Patient Name:   Jimmy Olson Date of Exam: 07/11/2022 Medical Rec #:  [id number]     Height:       66.0 in Accession #:    [id number]    Weight:  145.5 lb Date of Birth:  08/19/1960     BSA:          1.747 m Patient Age:    29 years      BP:           145/77 mmHg Patient Gender: M             HR:           81 bpm. Exam Location:  [facility] Procedure: 2D Echo, Cardiac Doppler and Color Doppler Indications:     R07.9 Chest pain  History:         Patient has prior history of Echocardiogram examinations, most                  recent 05/18/2022. Previous Myocardial Infarction and CAD, TIA;                  Risk Factors:Hypertension, Dyslipidemia and Diabetes. End Stage                  Renal Disease.  Sonographer:     Wilford Sports Rodgers-Jones RDCS Referring Phys:  [id number] NGEXBMWU A ARIDA Diagnosing Phys: Kate Sable MD IMPRESSIONS  1. Left ventricular ejection fraction,  by estimation, is 35 to 40%. The left ventricle has moderately decreased function. The left ventricle demonstrates global hypokinesis. There is mild left ventricular hypertrophy. Left ventricular diastolic parameters are consistent with Grade I diastolic dysfunction (impaired relaxation).  2. Right ventricular systolic function is normal. The right ventricular size is normal.  3. The mitral valve is normal in structure. Mild mitral valve regurgitation.  4. The aortic valve was not well visualized. Aortic valve regurgitation is not visualized. Aortic valve sclerosis/calcification is present, without any evidence of aortic stenosis.  5. The inferior vena cava is normal in size with greater than 50% respiratory variability, suggesting right atrial pressure of 3 mmHg. FINDINGS  Left Ventricle: Left ventricular ejection fraction, by estimation, is 35 to 40%. The left ventricle has moderately decreased function. The left ventricle demonstrates global hypokinesis. The left ventricular internal cavity size was normal in size. There is mild left ventricular hypertrophy. Left ventricular diastolic parameters are consistent with Grade I diastolic dysfunction (impaired relaxation). Right Ventricle: The right ventricular size is normal. No increase in right ventricular wall thickness. Right ventricular systolic function is normal. Left Atrium: Left atrial size was normal in size. Right Atrium: Right atrial size was normal in size. Pericardium: There is no evidence of pericardial effusion. Mitral Valve: The mitral valve is normal in structure. Mild mitral valve regurgitation. Tricuspid Valve: The tricuspid valve is normal in structure. Tricuspid valve regurgitation is not demonstrated. Aortic Valve: The aortic valve was not well visualized. Aortic valve regurgitation is not visualized. Aortic valve sclerosis/calcification is present, without any evidence of aortic stenosis. Pulmonic Valve: The pulmonic valve was normal in  structure. Pulmonic valve regurgitation is not visualized. Aorta: The aortic root and ascending aorta are structurally normal, with no evidence of dilitation. Venous: The inferior vena cava is normal in size with greater than 50% respiratory variability, suggesting right atrial pressure of 3 mmHg. IAS/Shunts: No atrial level shunt detected by color flow Doppler.  LEFT VENTRICLE PLAX 2D LVIDd:         5.50 cm   Diastology LVIDs:         4.00 cm   LV e' medial:    4.35 cm/s LV PW:         1.30 cm   LV  E/e' medial:  17.2 LV IVS:        1.10 cm   LV e' lateral:   7.40 cm/s LVOT diam:     1.90 cm   LV E/e' lateral: 10.1 LV SV:         40 LV SV Index:   23 LVOT Area:     2.84 cm  RIGHT VENTRICLE             IVC RV Basal diam:  4.80 cm     IVC diam: 0.70 cm RV S prime:     11.90 cm/s TAPSE (M-mode): 2.3 cm LEFT ATRIUM             Index        RIGHT ATRIUM           Index LA diam:        3.90 cm 2.23 cm/m   RA Area:     15.90 cm LA Vol (A2C):   52.7 ml 30.17 ml/m  RA Volume:   42.40 ml  24.27 ml/m LA Vol (A4C):   58.0 ml 33.20 ml/m LA Biplane Vol: 56.2 ml 32.17 ml/m  AORTIC VALVE LVOT Vmax:   75.47 cm/s LVOT Vmean:  51.367 cm/s LVOT VTI:    0.142 m  AORTA Ao Root diam: 3.20 cm Ao Asc diam:  3.50 cm MITRAL VALVE MV Area (PHT): 3.78 cm     SHUNTS MV Decel Time: 201 msec     Systemic VTI:  0.14 m MV E velocity: 74.65 cm/s   Systemic Diam: 1.90 cm MV A velocity: 106.00 cm/s MV E/A ratio:  0.70 Kate Sable MD Electronically signed by Kate Sable MD Signature Date/Time: 07/12/2022/11:48:50 AM    Final      Medications:    sodium chloride     dextrose 5% lactated ringers Stopped (07/10/22 1816)   heparin 1,100 Units/hr (07/12/22 1130)    aspirin EC  81 mg Oral Daily   carvedilol  25 mg Oral BID   Chlorhexidine Gluconate Cloth  6 each Topical Q0600   clopidogrel  75 mg Oral Daily   doxycycline  100 mg Oral BID   escitalopram  10 mg Oral QHS   feeding supplement (NEPRO CARB STEADY)  237 mL Oral TID  BM   hydrALAZINE  100 mg Oral TID   insulin aspart  0-6 Units Subcutaneous TID WC   insulin detemir  15 Units Subcutaneous QHS   isosorbide mononitrate  60 mg Oral BID   losartan  100 mg Oral Daily   pantoprazole  20 mg Oral Daily   rosuvastatin  40 mg Oral Daily   sodium chloride flush  3 mL Intravenous Q12H   sodium chloride flush  3 mL Intravenous Q12H   [START ON 07/15/2022] torsemide  20 mg Oral Once per day on Sun Sat   sodium chloride, acetaminophen, albuterol, dextrose, nitroGLYCERIN, ondansetron **OR** ondansetron (ZOFRAN) IV, sodium chloride flush  Assessment/ Plan:  Jimmy Olson is a 62 y.o.  male with past medical conditions including anemia, two-vessel CAD, diabetes, depression, DVT with Eliquis, and end-stage renal disease on hemodialysis.  Patient presents to the emergency department with complaints of shortness of breath and chest pain. Patient has been admitted for Flash pulmonary edema ([place]) [J81.0] Hyperglycemia [R73.9] Acute respiratory failure with hypoxia ([place]) [J96.01] Acute hypoxic respiratory failure ([place]) [J96.01]  [facility]/TTS/right chest PermCath  End-stage renal disease on hemodialysis.  Dialysis received yesterday, UF 2.4 L achieved.  Next treatment scheduled for Thursday.   2. Anemia of chronic kidney disease Lab Results  Component Value Date   HGB 8.5 (L) 07/12/2022    Patient receives [place] outpatient.  Hemoglobin not within desired range.  Will continue to monitor for now.  3. Secondary Hyperparathyroidism: with outpatient labs: PTH 387, phosphorus 5.1, calcium 9.1 on 06/29/22.   Lab Results  Component Value Date   CALCIUM 8.2 (L) 07/11/2022   CAION 1.19 05/26/2022   PHOS 3.5 07/11/2022    Calcium and phosphorus within desired range.  4. Diabetes mellitus type II with chronic kidney disease/renal manifestations: insulin dependent. Home regimen includes NovoLog, Humulog, Levemir. Most recent hemoglobin A1c is 10.1 on  05/17/22   Glucose remains elevated at times.  Primary team to manage SSI.    LOS: 2 Colon Flattery 1/24/202412:44 PM

## 2022-07-12 NOTE — Progress Notes (Addendum)
[facility] NAME: Jimmy Olson    MR#:  [id number]  DATE OF BIRTH:  1961/03/13  SUBJECTIVE:  via video [place] interpreter. No family at bedside during my evaluation. Patient apparently came in with increasing shortness of breath. Not missed any outpatient dialysis. Had elevated blood pressure. Found to be in flesh pulmonary edema. No and urgent hemodialysis with ultrafiltration. Currently on room air. Overall feels week. Cardiology plans for cardiac cath tomorrow.    VITALS:  Blood pressure 135/69, pulse 85, temperature 98.2 F (36.8 C), temperature source Oral, resp. rate 15, height 5\' 6"  (1.676 m), weight 66 kg, SpO2 97 %.  PHYSICAL EXAMINATION:   GENERAL:  62 y.o.-year-old patient lying in the bed with no acute distress. Chronically ill LUNGS: Normal breath sounds bilaterally, no wheezing CARDIOVASCULAR: S1, S2 normal. No murmur   ABDOMEN: Soft, nontender, nondistended. Bowel sounds present.  EXTREMITIES: No  edema b/l.   HD access NEUROLOGIC: nonfocal  patient is alert and awake SKIN: No obvious rash, lesion, or ulcer.   LABORATORY PANEL:  CBC Recent Labs  Lab 07/12/22 0512  WBC 5.4  HGB 8.5*  HCT 25.3*  PLT 155    Chemistries  Recent Labs  Lab 07/10/22 0615 07/10/22 1449 07/11/22 0623  NA  --    < > 133*  K  --    < > 3.3*  CL  --    < > 97*  CO2  --    < > 30  GLUCOSE  --    < > 147*  BUN  --    < > 35*  CREATININE  --    < > 4.14*  CALCIUM  --    < > 8.2*  AST 39  --   --   ALT 27  --   --   ALKPHOS 101  --   --   BILITOT 0.8  --   --    < > = values in this interval not displayed.   Cardiac Enzymes No results for input(s): "TROPONINI" in the last 168 hours. RADIOLOGY:  ECHOCARDIOGRAM COMPLETE  Result Date: 07/12/2022    ECHOCARDIOGRAM REPORT   Patient Name:   Jimmy Olson Date of Exam: 07/11/2022 Medical Rec #:  [id number]     Height:       66.0 in Accession #:    [id number]    Weight:       145.5  lb Date of Birth:  1960/10/04     BSA:          1.747 m Patient Age:    62 years      BP:           145/77 mmHg Patient Gender: M             HR:           81 bpm. Exam Location:  [facility] Procedure: 2D Echo, Cardiac Doppler and Color Doppler Indications:     R07.9 Chest pain  History:         Patient has prior history of Echocardiogram examinations, most                  recent 05/18/2022. Previous Myocardial Infarction and CAD, TIA;                  Risk Factors:Hypertension, Dyslipidemia and Diabetes. End Stage  Renal Disease.  Sonographer:     Wilford Sports Rodgers-Jones RDCS Referring Phys:  [id number] PJASNKNL A ARIDA Diagnosing Phys: Kate Sable MD IMPRESSIONS  1. Left ventricular ejection fraction, by estimation, is 35 to 40%. The left ventricle has moderately decreased function. The left ventricle demonstrates global hypokinesis. There is mild left ventricular hypertrophy. Left ventricular diastolic parameters are consistent with Grade I diastolic dysfunction (impaired relaxation).  2. Right ventricular systolic function is normal. The right ventricular size is normal.  3. The mitral valve is normal in structure. Mild mitral valve regurgitation.  4. The aortic valve was not well visualized. Aortic valve regurgitation is not visualized. Aortic valve sclerosis/calcification is present, without any evidence of aortic stenosis.  5. The inferior vena cava is normal in size with greater than 50% respiratory variability, suggesting right atrial pressure of 3 mmHg. FINDINGS  Left Ventricle: Left ventricular ejection fraction, by estimation, is 35 to 40%. The left ventricle has moderately decreased function. The left ventricle demonstrates global hypokinesis. The left ventricular internal cavity size was normal in size. There is mild left ventricular hypertrophy. Left ventricular diastolic parameters are consistent with Grade I diastolic dysfunction (impaired relaxation). Right Ventricle: The right  ventricular size is normal. No increase in right ventricular wall thickness. Right ventricular systolic function is normal. Left Atrium: Left atrial size was normal in size. Right Atrium: Right atrial size was normal in size. Pericardium: There is no evidence of pericardial effusion. Mitral Valve: The mitral valve is normal in structure. Mild mitral valve regurgitation. Tricuspid Valve: The tricuspid valve is normal in structure. Tricuspid valve regurgitation is not demonstrated. Aortic Valve: The aortic valve was not well visualized. Aortic valve regurgitation is not visualized. Aortic valve sclerosis/calcification is present, without any evidence of aortic stenosis. Pulmonic Valve: The pulmonic valve was normal in structure. Pulmonic valve regurgitation is not visualized. Aorta: The aortic root and ascending aorta are structurally normal, with no evidence of dilitation. Venous: The inferior vena cava is normal in size with greater than 50% respiratory variability, suggesting right atrial pressure of 3 mmHg. IAS/Shunts: No atrial level shunt detected by color flow Doppler.  LEFT VENTRICLE PLAX 2D LVIDd:         5.50 cm   Diastology LVIDs:         4.00 cm   LV e' medial:    4.35 cm/s LV PW:         1.30 cm   LV E/e' medial:  17.2 LV IVS:        1.10 cm   LV e' lateral:   7.40 cm/s LVOT diam:     1.90 cm   LV E/e' lateral: 10.1 LV SV:         40 LV SV Index:   23 LVOT Area:     2.84 cm  RIGHT VENTRICLE             IVC RV Basal diam:  4.80 cm     IVC diam: 0.70 cm RV S prime:     11.90 cm/s TAPSE (M-mode): 2.3 cm LEFT ATRIUM             Index        RIGHT ATRIUM           Index LA diam:        3.90 cm 2.23 cm/m   RA Area:     15.90 cm LA Vol (A2C):   52.7 ml 30.17 ml/m  RA Volume:   42.40 ml  24.27 ml/m LA Vol (A4C):   58.0 ml 33.20 ml/m LA Biplane Vol: 56.2 ml 32.17 ml/m  AORTIC VALVE LVOT Vmax:   75.47 cm/s LVOT Vmean:  51.367 cm/s LVOT VTI:    0.142 m  AORTA Ao Root diam: 3.20 cm Ao Asc diam:  3.50 cm MITRAL  VALVE MV Area (PHT): 3.78 cm     SHUNTS MV Decel Time: 201 msec     Systemic VTI:  0.14 m MV E velocity: 74.65 cm/s   Systemic Diam: 1.90 cm MV A velocity: 106.00 cm/s MV E/A ratio:  0.70 Kate Sable MD Electronically signed by Kate Sable MD Signature Date/Time: 07/12/2022/11:48:50 AM    Final     Assessment and Plan   62 year old male with past medical history of diabetes mellitus type 2 on insulin, end-stage renal disease on hemodialysis, COPD and recent hospitalization in December for flash pulmonary edema and non-STEMI.  At that time, patient had cardiac cath and found to have heavily calcified LAD and ended up having a drug-eluting stent placement in follow-up at [facility] at the end of the month.  Nephrology and cardiology were consulted on admission.  Patient just discharged on 12/1 for volume overload and hypertensive emergency.  Stabilized with diuresis and dialysis.   Patient recently had been seen in the emergency department on 1/19 for shortness of breath and was treated with bronchitis.  Viral testing at that time was negative.  He was sent home and then on the night of 1/21 developed sudden onset shortness of breath and found to be in hypertensive urgency and hypoxic despite 4 L nasal cannula.  Patient started on nitroglycerin drip and chest x-ray concerning for pulmonary edema.  Patient admitted to hospitalist service for acute respiratory failure secondary to acute on chronic systolic/diastolic heart failure.  Nephrology consulted and took patient for dialysis and by afternoon of 1/22, blood pressures normalized and by morning of 1/23, patient's oxygen saturations normal on room air. Pt's sugars much improved.     Assessment and Plan: Acute hypoxic respiratory failure (HCC)-resolved as of 07/11/2022  Acute on chronic combined systolic and diastolic CHF (congestive heart failure) ([place]) Most likely secondary to hypertensive urgency with flash pulmonary  edema Patient's last known LVEF was 40 to 45% from a 2D echocardiogram which was done 11/23 .  With volume improved through dialysis and Lasix, back to baseline --Initially requiring high flow nasal cannula since he did not tolerate BiPAP.  With diuresis and volume removal, has much improved and almost fully resolved.  Breathing comfortably on RA --07/11/22--EF 35-40%-- given recurrent pulmonary edema with hypertension cardiology recommends cardiac cath plan for tomorrow is aware.   Hyperosmolar hyperglycemic state (HHS) (HCC)-resolved as of 07/11/2022 Noted to have significant hyperglycemia upon arrival without evidence of metabolic acidosis.  Significant hyperglycemia possibly due to recent systemic steroid administration for acute COPD exacerbation.  Patient was placed on an insulin drip which has been discontinued since blood glucose has improved.   --Continue long-acting insulin with sliding scale coverage   End-stage renal disease on hemodialysis [facility]) --Dialysis days are T/TH/S --Patient admitted to the hospital in respiratory distress and found to have pulmonary edema and received dialysis on 1/22 and 1/23.  Appreciate nephrology help   Hypertensive emergency-resolved as of 07/10/2022 Patient with hypertensive emergency and complications of flash pulmonary edema Started on a nitroglycerin drip with improvement in his blood pressure and this has since been discontinued.   --on  multiple antihypertensive medications   CAD S/P  percutaneous coronary angioplasty --History of coronary artery disease status post recent left heart cath with stent angioplasty.  Noted to have elevated troponin levels which may be secondary to demand ischemia from hypertensive emergency and acute CHF.  See below   Elevated troponin/NSTEMI --Seen by cardiology and elevated troponins felt to be perfusion mismatch.  Repeat echocardiogram ordered and unchanged, no further cardiology intervention needed.  Echo shws EF of  35-40% .  Troponins are trending downward. --on IV heparin gtt --cardiac cath planned for am 1/25   COPD with acute exacerbation (HCC)-resolved as of 07/11/2022 Place patient on scheduled and as needed bronchodilator therapy Place patient on inhaled steroids Continue oxygen supplementation to maintain pulse oximetry greater than 92% Completed  course of doxycycline prescribed during his last ER visit.   Hyponatremia Pseudohyponatremia to hyperglycemia as well as hypervolemia from CHF.  Improved with treatment of hyperglycemia   History of anemia due to CKD H&H is stable Monitor closely during this hospitalization           Procedures: Family communication :none today Consults : neurology, nephrology CODE STATUS: full DVT Prophylaxis : heparin drip Level of care: Telemetry Cardiac Status is: Inpatient Remains inpatient appropriate because: treatment of congestive heart failure,/pulmonary edema, cardiology planning heart cath tomorrow    TOTAL TIME TAKING CARE OF THIS PATIENT: 35 minutes.  >50% time spent on counselling and coordination of care  Note: This dictation was prepared with Dragon dictation along with smaller phrase technology. Any transcriptional errors that result from this process are unintentional.  Fritzi Mandes M.D    [facility]   CC: Primary care physician; [facility], [place]

## 2022-07-12 NOTE — Progress Notes (Signed)
Cardiology Progress Note   Patient Name: Jimmy Olson Date of Encounter: 07/12/2022  Primary Cardiologist: Ida Rogue, MD  Subjective   Feels nasal congestion. Breathing stable, but not back to baseline.  Has had some blood-tinged secretions or blowing his nose.  No chest pain.  Discussed prelim echo findings  EF sl worse.  Will plan on cath tomorrow - pt/family agreeable.  Inpatient Medications    Scheduled Meds:  aspirin EC  81 mg Oral Daily   carvedilol  25 mg Oral BID   Chlorhexidine Gluconate Cloth  6 each Topical Q0600   clopidogrel  75 mg Oral Daily   doxycycline  100 mg Oral BID   escitalopram  10 mg Oral QHS   feeding supplement (NEPRO CARB STEADY)  237 mL Oral TID BM   hydrALAZINE  100 mg Oral TID   insulin aspart  0-6 Units Subcutaneous TID WC   insulin detemir  15 Units Subcutaneous QHS   isosorbide mononitrate  60 mg Oral BID   losartan  100 mg Oral Daily   pantoprazole  20 mg Oral Daily   rosuvastatin  40 mg Oral Daily   sodium chloride flush  3 mL Intravenous Q12H   [START ON 07/15/2022] torsemide  20 mg Oral Once per day on Sun Sat   Continuous Infusions:  sodium chloride     dextrose 5% lactated ringers Stopped (07/10/22 1816)   heparin 1,100 Units/hr (07/12/22 0248)   PRN Meds: sodium chloride, acetaminophen, albuterol, dextrose, nitroGLYCERIN, ondansetron **OR** ondansetron (ZOFRAN) IV, sodium chloride flush   Vital Signs    Vitals:   07/12/22 0300 07/12/22 0400 07/12/22 0500 07/12/22 0854  BP: (!) 154/73 (!) 142/76 (!) 169/90 (!) 151/80  Pulse: 81 80 84 83  Resp: 15 (!) 21 20 18   Temp:    98.2 F (36.8 C)  TempSrc:    Oral  SpO2: 96% 96% 97% 96%  Weight:      Height:        Intake/Output Summary (Last 24 hours) at 07/12/2022 1036 Last data filed at 07/12/2022 0858 Gross per 24 hour  Intake --  Output 3125 ml  Net -3125 ml   Filed Weights   07/10/22 0409 07/11/22 0752 07/11/22 1203  Weight: 69.6 kg 68.5 kg 66 kg    Physical  Exam   GEN: Well nourished, well developed, in no acute distress.  HEENT: Grossly normal.  Neck: Supple, no JVD, carotid bruits, or masses. Cardiac: RRR, no murmurs, rubs, or gallops. No clubbing, cyanosis, edema.  Radials 2+, DP/PT 2+ and equal bilaterally.  Respiratory:  Respirations regular and unlabored, clear to auscultation bilaterally. GI: Soft, nontender, nondistended, BS + x 4. MS: no deformity or atrophy. Skin: warm and dry, no rash. Neuro:  Strength and sensation are intact. Psych: AAOx3.  Normal affect.  Labs    Chemistry Recent Labs  Lab 07/07/22 2152 07/10/22 0404 07/10/22 0615 07/10/22 1449 07/11/22 0623  NA 133* 129*  --  130* 133*  K 4.9 5.1  --  3.1* 3.3*  CL 101 95*  --  94* 97*  CO2 23 21*  --  25 30  GLUCOSE 423* 972*  --  284* 147*  BUN 39* 58*  --  26* 35*  CREATININE 4.44* 5.29*  --  2.80* 4.14*  CALCIUM 8.9 9.0  --  8.2* 8.2*  PROT 7.2  --  7.1  --   --   ALBUMIN 3.4*  --  3.6  --   --  AST 32  --  39  --   --   ALT 16  --  27  --   --   ALKPHOS 97  --  101  --   --   BILITOT 1.1  --  0.8  --   --   GFRNONAA 14* 12*  --  25* 16*  ANIONGAP 9 13  --  11 6     Hematology Recent Labs  Lab 07/10/22 0404 07/11/22 0128 07/12/22 0512  WBC 13.9* 9.0 5.4  RBC 3.28* 2.67* 2.77*  HGB 10.2* 8.1* 8.5*  HCT 30.7* 24.1* 25.3*  MCV 93.6 90.3 91.3  MCH 31.1 30.3 30.7  MCHC 33.2 33.6 33.6  RDW 13.6 13.4 13.1  PLT 284 173 155    Cardiac Enzymes  Recent Labs  Lab 07/10/22 0615 07/10/22 0857 07/10/22 1449 07/11/22 1928 07/12/22 0512  TROPONINIHS 644* 1,723* 6,193* 9,102* 7,911*      BNP    Component Value Date/Time   BNP >4,500.0 (H) 07/10/2022 0404   Lipids  Lab Results  Component Value Date   CHOL 113 10/02/2021   HDL 34 (L) 10/02/2021   LDLCALC 45 10/02/2021   TRIG 170 (H) 10/02/2021   CHOLHDL 3.3 10/02/2021    HbA1c  Lab Results  Component Value Date   HGBA1C 10.1 (H) 05/17/2022    Radiology    CT HEAD WO CONTRAST  (5MM)  Result Date: 07/10/2022 CLINICAL DATA:  Chest pain and shortness of breath. EXAM: CT HEAD WITHOUT CONTRAST TECHNIQUE: Contiguous axial images were obtained from the base of the skull through the vertex without intravenous contrast. RADIATION DOSE REDUCTION: This exam was performed according to the departmental dose-optimization program which includes automated exposure control, adjustment of the mA and/or kV according to patient size and/or use of iterative reconstruction technique. COMPARISON:  04/04/2022 FINDINGS: Brain: No evidence of acute infarction, hemorrhage, hydrocephalus, extra-axial collection or mass lesion/mass effect. Vascular: No hyperdense vessel or unexpected calcification. Skull: Normal. Negative for fracture or focal lesion. Sinuses/Orbits: No acute finding. Other: Generalized fairly mild motion artifact IMPRESSION: Negative motion degraded head CT. Electronically Signed   By: Jorje Guild M.D.   On: 07/10/2022 06:43   DG Chest Port 1 View  Result Date: 07/10/2022 CLINICAL DATA:  Chest pain EXAM: PORTABLE CHEST 1 VIEW COMPARISON:  Three days ago FINDINGS: Worsening interstitial opacity with Kerley lines and fissure thickening. Chronic cardiopericardial enlargement with stable mediastinal contours including hilar vessel congestion. Dialysis catheter on the right in good position. No pneumothorax. IMPRESSION: Worsening pulmonary edema. Electronically Signed   By: Jorje Guild M.D.   On: 07/10/2022 04:44    Telemetry    RSR - Personally Reviewed  Cardiac Studies   2D Echocardiogram    04/2022 Echo: EF 40-45%, mild LVH, small apical region of noncompaction, GrII DD, nl RV fxn.    Cardiac Catheterization and Percutaneous Coronary Intervention    05/2022 Cath: LM mild dzs, LAD 40ost/p, 42m, D1 70, RI mild dzs, LCX small, mild dzs, RCA 20p, 54m/d, RPDA 40   05/2022 Staged PCI: Orbital atherectomy of mLAD (2.75x48 Synergy XD DES).  _____________   Patient Profile      62 y.o. male with a history of coronary artery disease status post LAD stenting in December 2023, mixed ischemic and nonischemic cardiomyopathy with an EF of 40-45% November 2023, chronic HFrEF, hypertension, hyperlipidemia, type 2 diabetes mellitus, end-stage renal disease on hemodialysis, left upper extremity DVT previously on Eliquis, and TIA, who presented to [facility] early on January 22 with acute on chronic hypoxic respiratory failure, volume overload, and nstemi.  Assessment & Plan    1.  Acute hypoxic respiratory failure/acute on chronic heart failure with reduced ejection fraction/mixed ischemic and nonischemic cardiomyopathy: EF 40 to 45% by echo November 2023 with small apical region of noncompaction and grade 2 diastolic dysfunction.  Seen in the ED January 19 for dyspnea and treated for acute bronchitis with prednisone.  Chest x-ray at that time showed pulmonary vascular congestion and mild edema.  On the evening of January 21, he had abrupt onset of dyspnea and chest pain.  In the ED, saturations in the low 80s and at one point, he required high flow nasal cannula.   No recurrent c/p. Tolerated HD yesterday.  Feels nasal congestion, breathing not quite back to baseline.  EF worse on echo - plan for cath tomorrow.  Cont ? blocker and arb.  Volume mgmt per nephrology w/ plan for HD tomorrow.  2.  NSTEMI/CAD:  Known CAD status post recent atherectomy and drug-eluting stent placement to the LAD in December 2023.  Residual moderate diag and ostial LAD dzs.  In setting of #1, he did experience chest pain and HsTrop peaked @ 9102.  Now down-trending.  No recurrent chest pain.  Echo performed late yesterday and reviewed by Dr. Garen Lah this morning.  Further decline in LV function, now 35%.  In that setting, patient will require diagnostic catheterization.  Discussed with patient and family today.  The patient understands that risks include but are not limited to stroke (1 in 1000), death  (1 in 90), kidney failure [usually temporary] (1 in 500), bleeding (1 in 200), allergic reaction [possibly serious] (1 in 200), and agrees to proceed.  We will plan for catheterization tomorrow with Dr. Ellyn Hack.  Continue dual antiplatelet therapy, beta-blocker, statin, and heparin.  3.  Essential hypertension: Blood pressure higher today in the 150s to 160s.  Was better yesterday following dialysis.  Continue beta-blocker, hydralazine, nitrate, and ARB therapy.  4.  Hyperlipidemia: LDL 45 in April 2023.  Continue statin therapy  5.  Type 2 diabetes mellitus/hyperglycemia: Glucose 972 on arrival in the setting of prednisone therapy.  Insulin management per internal medicine.  6.  End stage renal disease: Plan for hemodialysis tomorrow.  Nephrology following.  7.  Normocytic anemia:  stable.  Signed, Murray Hodgkins, NP  07/12/2022, 10:36 AM    For questions or updates, please contact   Please consult [email or web address] for contact info under Cardiology/STEMI.

## 2022-07-12 NOTE — Consult Note (Signed)
ANTICOAGULATION CONSULT NOTE  Pharmacy Consult for heparin drip  Indication: ACS/STEMI  No Known Allergies  Patient Measurements: Height: 5\' 6"  (167.6 cm) Weight: 66 kg (145 lb 8.1 oz) IBW/kg (Calculated) : 63.8 Heparin Dosing Weight: 66 kg  Vital Signs: Temp: 98.2 F (36.8 C) (01/24 0854) Temp Source: Oral (01/24 0854) BP: 151/80 (01/24 0854) Pulse Rate: 83 (01/24 0854)  Labs: Recent Labs    07/10/22 0404 07/10/22 0615 07/10/22 1449 07/10/22 1630 07/11/22 0127 07/11/22 0128 07/11/22 0623 07/11/22 1548 07/11/22 1928 07/12/22 0157 07/12/22 0512 07/12/22 1050  HGB 10.2*  --   --   --   --  8.1*  --   --   --   --  8.5*  --   HCT 30.7*  --   --   --   --  24.1*  --   --   --   --  25.3*  --   PLT 284  --   --   --   --  173  --   --   --   --  155  --   APTT  --   --   --  23*  --   --   --   --   --   --   --   --   LABPROT  --   --   --  14.1  --   --   --   --   --   --   --   --   INR  --   --   --  1.1  --   --   --   --   --   --   --   --   HEPARINUNFRC  --   --   --  <0.10*   < >  --   --  0.26*  --  0.27*  --  0.41  CREATININE 5.29*  --  2.80*  --   --   --  4.14*  --   --   --   --   --   TROPONINIHS 610*   < > 6,193*  --   --   --   --   --  9,102*  --  7,911*  --    < > = values in this interval not displayed.     Estimated Creatinine Clearance: 17.3 mL/min (A) (by C-G formula based on SCr of 4.14 mg/dL (H)).   Medical History: Past Medical History:  Diagnosis Date   Anemia    Anxiety and depression    ATN (acute tubular necrosis) (HCC)    CAD (coronary artery disease)    a. 06/2020 Cath: Nonobs dzs; b. 05/2021 NSTEMI/Cath: Nonobs dzs->demand isch; c. 05/2022 Cath: LM mild dzs, LAD 40ost/p, 19m, D1 70, RI mild dzs, LCX small, mild dzs, RCA 20p, 21m/d, RPDA 40; d. 05/2022 Staged PCI: Orbital atherectomy of mLAD  (2.75x48 Synergy XD DES).   Chronic HFrEF (heart failure with reduced ejection fraction) ([place])    a. 08/2019 Echo: EF 45-50%; b. 06/2020 Echo:  EF 30-35%; c. 04/2022 Echo: EF 40-45%, mild LVH, small apical region of noncompaction, GrII DD, nl RV fxn.   DVT of axillary vein, acute left ([place]) 05/2021   a.) Tx'd with apixaban   ESRD (end stage renal disease) on dialysis [facility])    a.) M-W-F   History of 2019 novel coronavirus disease (COVID-19) 03/06/2020   a.) s/p Tx with monoclonal Ab infusion  HLD (hyperlipidemia)    Hyperkalemia 12/23/2020   Hypertension    Left carotid artery stenosis 08/18/2021   a.) Carotid Doppler 79/72/8206: 01-56% LICA stenosis   Long term current use of anticoagulant    a.) apixaban for DVT   Mixed Ischemic & Nonischemic Cardiomyopathy    a. 08/2019 Echo: EF 45-50%; b. 06/2020 Echo: EF 30-35%; c. 04/2022 Echo: EF 40-45%, mild LVH, small apical region of noncompaction, GrII DD, nl RV fxn.   NSTEMI (non-ST elevated myocardial infarction) ([place]) 07/15/2020   Sepsis ([place])    Suicidal ideations    TIA (transient ischemic attack) 06/2021   Type 2 diabetes mellitus treated with insulin (HCC)     Medications:  Patient on Eliquis in the past for upper extremity DVT.  Most recent clinic notes indicate that Eliquis was stopped.  Assessment: 62 yo male presented to the ED with complaint of shortness of breath.  Patient has history of CAD.  Found to have elevated troponin in the ED.   Baseline: aPTT and PT/INR pending  Goal of Therapy:  Heparin level 0.3-0.7 units/ml Monitor platelets by anticoagulation protocol: Yes  1/23 @ 0127 HL 0.36, therapeutic x 1 1/23 @ 1548 HL 0.26, subtherapeutic @ 850 units/hr 1/24 0157 HL 0.27, subtherapeutic 1/24 @ 1050 HL 0.41, therapeutic x 1  Plan: Continue heparin drip at 1100 units/hr Recheck HL in 8 hours to confirm Daily CBC while on heparin  Beatris Si, PharmD 07/12/2022 11:15 AM

## 2022-07-13 ENCOUNTER — Other Ambulatory Visit: Payer: Self-pay

## 2022-07-13 ENCOUNTER — Encounter: Payer: Self-pay | Admitting: Cardiology

## 2022-07-13 ENCOUNTER — Encounter
Admission: EM | Disposition: A | Discharge status: Home / Self Care | Payer: Self-pay | Source: Home / Self Care | Attending: Internal Medicine

## 2022-07-13 DIAGNOSIS — I214 Non-ST elevation (NSTEMI) myocardial infarction: Secondary | ICD-10-CM | POA: Diagnosis not present

## 2022-07-13 DIAGNOSIS — I2510 Atherosclerotic heart disease of native coronary artery without angina pectoris: Secondary | ICD-10-CM

## 2022-07-13 DIAGNOSIS — I5043 Acute on chronic combined systolic (congestive) and diastolic (congestive) heart failure: Secondary | ICD-10-CM | POA: Diagnosis not present

## 2022-07-13 DIAGNOSIS — I255 Ischemic cardiomyopathy: Secondary | ICD-10-CM | POA: Diagnosis not present

## 2022-07-13 HISTORY — PX: LEFT HEART CATH AND CORONARY ANGIOGRAPHY: CATH118249

## 2022-07-13 LAB — POCT I-STAT 7, (LYTES, BLD GAS, ICA,H+H)
Acid-base deficit: 4 mmol/L — ABNORMAL HIGH (ref 0.0–2.0)
Bicarbonate: 18.8 mmol/L — ABNORMAL LOW (ref 20.0–28.0)
Calcium, Ion: 1.19 mmol/L (ref 1.15–1.40)
HCT: 24 % — ABNORMAL LOW (ref 39.0–52.0)
Hemoglobin: 8.2 g/dL — ABNORMAL LOW (ref 13.0–17.0)
O2 Saturation: 99 %
Potassium: 3.9 mmol/L (ref 3.5–5.1)
Sodium: 131 mmol/L — ABNORMAL LOW (ref 135–145)
TCO2: 20 mmol/L — ABNORMAL LOW (ref 22–32)
pCO2 arterial: 25.2 mmHg — ABNORMAL LOW (ref 32–48)
pH, Arterial: 7.48 — ABNORMAL HIGH (ref 7.35–7.45)
pO2, Arterial: 133 mmHg — ABNORMAL HIGH (ref 83–108)

## 2022-07-13 LAB — GLUCOSE, CAPILLARY
Glucose-Capillary: 253 mg/dL — ABNORMAL HIGH (ref 70–99)
Glucose-Capillary: 215 mg/dL — ABNORMAL HIGH (ref 70–99)
Glucose-Capillary: 283 mg/dL — ABNORMAL HIGH (ref 70–99)
Glucose-Capillary: 252 mg/dL — ABNORMAL HIGH (ref 70–99)
Glucose-Capillary: 265 mg/dL — ABNORMAL HIGH (ref 70–99)

## 2022-07-13 LAB — CBC
HCT: 24.9 % — ABNORMAL LOW (ref 39.0–52.0)
HCT: 25.8 % — ABNORMAL LOW (ref 39.0–52.0)
Hemoglobin: 8.7 g/dL — ABNORMAL LOW (ref 13.0–17.0)
Hemoglobin: 9.1 g/dL — ABNORMAL LOW (ref 13.0–17.0)
MCH: 30.7 pg (ref 26.0–34.0)
MCH: 31 pg (ref 26.0–34.0)
MCHC: 34.9 g/dL (ref 30.0–36.0)
MCHC: 35.3 g/dL (ref 30.0–36.0)
MCV: 88 fL (ref 80.0–100.0)
MCV: 87.8 fL (ref 80.0–100.0)
Platelets: 159 10*3/uL (ref 150–400)
Platelets: 181 10*3/uL (ref 150–400)
RBC: 2.83 MIL/uL — ABNORMAL LOW (ref 4.22–5.81)
RBC: 2.94 MIL/uL — ABNORMAL LOW (ref 4.22–5.81)
RDW: 12.8 % (ref 11.5–15.5)
RDW: 12.9 % (ref 11.5–15.5)
WBC: 3.8 10*3/uL — ABNORMAL LOW (ref 4.0–10.5)
WBC: 3.7 10*3/uL — ABNORMAL LOW (ref 4.0–10.5)
nRBC: 0 % (ref 0.0–0.2)
nRBC: 0 % (ref 0.0–0.2)

## 2022-07-13 LAB — CREATININE, SERUM
Creatinine, Ser: 5.85 mg/dL — ABNORMAL HIGH (ref 0.61–1.24)
GFR, Estimated: 10 mL/min — ABNORMAL LOW (ref >60)

## 2022-07-13 LAB — HEPARIN LEVEL (UNFRACTIONATED): Heparin Unfractionated: 0.56 IU/mL (ref 0.30–0.70)

## 2022-07-13 SURGERY — LEFT HEART CATH AND CORONARY ANGIOGRAPHY
Anesthesia: Moderate Sedation

## 2022-07-13 MED ORDER — SODIUM CHLORIDE 0.9 % IV SOLN: INTRAVENOUS | Status: DC

## 2022-07-13 MED ORDER — LABETALOL HCL 5 MG/ML IV SOLN: 10.0000 mg | INTRAVENOUS | Status: AC | PRN

## 2022-07-13 MED ORDER — ORAL CARE MOUTH RINSE: 15.0000 mL | OROMUCOSAL | Status: DC | PRN

## 2022-07-13 MED ORDER — HYDRALAZINE HCL 20 MG/ML IJ SOLN: 10.0000 mg | INTRAMUSCULAR | Status: AC | PRN

## 2022-07-13 MED ORDER — LABETALOL HCL 5 MG/ML IV SOLN
INTRAVENOUS | Status: AC
  Filled 2022-07-13: qty 4

## 2022-07-13 MED ORDER — ACETAMINOPHEN 325 MG PO TABS
650.0000 mg | ORAL_TABLET | ORAL | Status: DC | PRN
  Administered 2022-07-15: 650 mg via ORAL

## 2022-07-13 MED ORDER — SODIUM CHLORIDE 0.9 % IV SOLN: 250.0000 mL | INTRAVENOUS | Status: DC | PRN

## 2022-07-13 MED ORDER — MIDAZOLAM HCL 2 MG/2ML IJ SOLN
INTRAMUSCULAR | Status: DC | PRN
  Administered 2022-07-13: 1 mg via INTRAVENOUS

## 2022-07-13 MED ORDER — HEPARIN SODIUM (PORCINE) 5000 UNIT/ML IJ SOLN
5000.0000 [IU] | Freq: Three times a day (TID) | INTRAMUSCULAR | Status: DC
  Administered 2022-07-13 – 2022-07-15 (×5): 5000 [IU] via SUBCUTANEOUS
  Filled 2022-07-13 (×5): qty 1

## 2022-07-13 MED ORDER — MIDAZOLAM HCL 2 MG/2ML IJ SOLN
INTRAMUSCULAR | Status: AC
  Filled 2022-07-13: qty 2

## 2022-07-13 MED ORDER — SODIUM CHLORIDE 0.9% FLUSH: 3.0000 mL | INTRAVENOUS | Status: DC | PRN

## 2022-07-13 MED ORDER — FENTANYL CITRATE (PF) 100 MCG/2ML IJ SOLN
INTRAMUSCULAR | Status: DC | PRN
  Administered 2022-07-13: 25 ug via INTRAVENOUS

## 2022-07-13 MED ORDER — LABETALOL HCL 5 MG/ML IV SOLN
INTRAVENOUS | Status: DC | PRN
  Administered 2022-07-13: 10 mg via INTRAVENOUS

## 2022-07-13 MED ORDER — HEPARIN (PORCINE) IN NACL 1000-0.9 UT/500ML-% IV SOLN
INTRAVENOUS | Status: AC
  Filled 2022-07-13: qty 1000

## 2022-07-13 MED ORDER — SODIUM CHLORIDE 0.9% FLUSH
3.0000 mL | Freq: Two times a day (BID) | INTRAVENOUS | Status: DC
  Administered 2022-07-13: 3 mL via INTRAVENOUS

## 2022-07-13 MED ORDER — HEPARIN (PORCINE) IN NACL 1000-0.9 UT/500ML-% IV SOLN
INTRAVENOUS | Status: DC | PRN
  Administered 2022-07-13 (×2): 500 mL

## 2022-07-13 MED ORDER — FENTANYL CITRATE (PF) 100 MCG/2ML IJ SOLN
INTRAMUSCULAR | Status: AC
  Filled 2022-07-13: qty 2

## 2022-07-13 MED ORDER — IOHEXOL 300 MG/ML  SOLN
INTRAMUSCULAR | Status: DC | PRN
  Administered 2022-07-13: 48 mL

## 2022-07-13 MED ORDER — ASPIRIN 81 MG PO CHEW
81.0000 mg | CHEWABLE_TABLET | ORAL | Status: DC
  Filled 2022-07-13: qty 1

## 2022-07-13 MED ORDER — ONDANSETRON HCL 4 MG/2ML IJ SOLN: 4.0000 mg | Freq: Four times a day (QID) | INTRAMUSCULAR | Status: DC | PRN

## 2022-07-13 MED ORDER — HEPARIN SODIUM (PORCINE) 1000 UNIT/ML IJ SOLN
INTRAMUSCULAR | Status: AC
  Filled 2022-07-13: qty 10

## 2022-07-13 SURGICAL SUPPLY — 10 items
CATH 5FR JL4 DIAGNOSTIC (CATHETERS) IMPLANT
CATH INFINITI JR4 5F (CATHETERS) IMPLANT
DRAPE BRACHIAL (DRAPES) IMPLANT
KIT MICROPUNCTURE NIT STIFF (SHEATH) IMPLANT
PACK CARDIAC CATH (CUSTOM PROCEDURE TRAY) ×1 IMPLANT
PROTECTION STATION PRESSURIZED (MISCELLANEOUS) ×1
SET ATX SIMPLICITY (MISCELLANEOUS) IMPLANT
SHEATH AVANTI 5FR X 11CM (SHEATH) IMPLANT
STATION PROTECTION PRESSURIZED (MISCELLANEOUS) IMPLANT
WIRE GUIDERIGHT .035X150 (WIRE) IMPLANT

## 2022-07-13 NOTE — Progress Notes (Signed)
[facility] NAME: Jimmy Olson    MR#:  [id number]  DATE OF BIRTH:  09/27/1960  SUBJECTIVE:  via video [place] interpreter. No family at bedside during my evaluation.  Sob improving On heparin gtt Awaiting cath today   VITALS:  Blood pressure (!) 172/83, pulse 78, temperature 99.2 F (37.3 C), temperature source Oral, resp. rate 12, height 5\' 6"  (1.676 m), weight 66 kg, SpO2 96 %.  PHYSICAL EXAMINATION:   GENERAL:  62 y.o.-year-old patient lying in the bed with no acute distress. Chronically ill LUNGS: Normal breath sounds bilaterally, no wheezing CARDIOVASCULAR: S1, S2 normal. No murmur   ABDOMEN: Soft, nontender, nondistended. Bowel sounds present.  EXTREMITIES: No  edema b/l.   HD access NEUROLOGIC: nonfocal  patient is alert and awake SKIN: No obvious rash, lesion, or ulcer.   LABORATORY PANEL:  CBC Recent Labs  Lab 07/13/22 0546 07/13/22 1323  WBC 3.8*  --   HGB 8.7* 8.2*  HCT 24.9* 24.0*  PLT 159  --      Chemistries  Recent Labs  Lab 07/10/22 0615 07/10/22 1449 07/11/22 0623 07/13/22 1323  NA  --    < > 133* 131*  K  --    < > 3.3* 3.9  CL  --    < > 97*  --   CO2  --    < > 30  --   GLUCOSE  --    < > 147*  --   BUN  --    < > 35*  --   CREATININE  --    < > 4.14*  --   CALCIUM  --    < > 8.2*  --   AST 39  --   --   --   ALT 27  --   --   --   ALKPHOS 101  --   --   --   BILITOT 0.8  --   --   --    < > = values in this interval not displayed.    Cardiac Enzymes No results for input(s): "TROPONINI" in the last 168 hours. RADIOLOGY:  CARDIAC CATHETERIZATION  Result Date: 07/13/2022   Ost LAD to Prox LAD lesion is 50% stenosed.  Prox LAD lesion is 40% stenosed.   Prox LAD to Mid LAD stent is widely patent with minimal-5% stenosed.   1st Diag lesion is 45% stenosed.   Prox RCA lesion is 20% stenosed.  Mid RCA lesion is 30% stenosed.  Dist RCA lesion is 40% stenosed.  RPDA lesion is 40%  stenosed.   LV end diastolic pressure is severely elevated.   There is no aortic valve stenosis. Post-Cath Diagnoses Widely patent LAD stent with otherwise minimal CAD from last catheterization Severely elevated LVEDP of 28 mmHg indicating pressure overload (acute On Chronic Combined Systolic and Diastolic Heart Failure). Suspect this is the etiology for elevated troponin levels. Would not call this a non-ST elevation myocardial infarction. Recommendations: Systemic blood pressure control and volume removal with dialysis. Glenetta Hew, MD   ECHOCARDIOGRAM COMPLETE  Result Date: 07/12/2022    ECHOCARDIOGRAM REPORT   Patient Name:   Jimmy Olson Date of Exam: 07/11/2022 Medical Rec #:  [id number]     Height:       66.0 in Accession #:    [id number]    Weight:       145.5 lb Date of Birth:  Mar 27, 1961  BSA:          1.747 m Patient Age:    85 years      BP:           145/77 mmHg Patient Gender: M             HR:           81 bpm. Exam Location:  [facility] Procedure: 2D Echo, Cardiac Doppler and Color Doppler Indications:     R07.9 Chest pain  History:         Patient has prior history of Echocardiogram examinations, most                  recent 05/18/2022. Previous Myocardial Infarction and CAD, TIA;                  Risk Factors:Hypertension, Dyslipidemia and Diabetes. End Stage                  Renal Disease.  Sonographer:     Wilford Sports Rodgers-Jones RDCS Referring Phys:  [id number] A ARIDA Diagnosing Phys: Kate Sable MD IMPRESSIONS  1. Left ventricular ejection fraction, by estimation, is 35 to 40%. The left ventricle has moderately decreased function. The left ventricle demonstrates global hypokinesis. There is mild left ventricular hypertrophy. Left ventricular diastolic parameters are consistent with Grade I diastolic dysfunction (impaired relaxation).  2. Right ventricular systolic function is normal. The right ventricular size is normal.  3. The mitral valve is normal in structure. Mild mitral valve  regurgitation.  4. The aortic valve was not well visualized. Aortic valve regurgitation is not visualized. Aortic valve sclerosis/calcification is present, without any evidence of aortic stenosis.  5. The inferior vena cava is normal in size with greater than 50% respiratory variability, suggesting right atrial pressure of 3 mmHg. FINDINGS  Left Ventricle: Left ventricular ejection fraction, by estimation, is 35 to 40%. The left ventricle has moderately decreased function. The left ventricle demonstrates global hypokinesis. The left ventricular internal cavity size was normal in size. There is mild left ventricular hypertrophy. Left ventricular diastolic parameters are consistent with Grade I diastolic dysfunction (impaired relaxation). Right Ventricle: The right ventricular size is normal. No increase in right ventricular wall thickness. Right ventricular systolic function is normal. Left Atrium: Left atrial size was normal in size. Right Atrium: Right atrial size was normal in size. Pericardium: There is no evidence of pericardial effusion. Mitral Valve: The mitral valve is normal in structure. Mild mitral valve regurgitation. Tricuspid Valve: The tricuspid valve is normal in structure. Tricuspid valve regurgitation is not demonstrated. Aortic Valve: The aortic valve was not well visualized. Aortic valve regurgitation is not visualized. Aortic valve sclerosis/calcification is present, without any evidence of aortic stenosis. Pulmonic Valve: The pulmonic valve was normal in structure. Pulmonic valve regurgitation is not visualized. Aorta: The aortic root and ascending aorta are structurally normal, with no evidence of dilitation. Venous: The inferior vena cava is normal in size with greater than 50% respiratory variability, suggesting right atrial pressure of 3 mmHg. IAS/Shunts: No atrial level shunt detected by color flow Doppler.  LEFT VENTRICLE PLAX 2D LVIDd:         5.50 cm   Diastology LVIDs:         4.00 cm    LV e' medial:    4.35 cm/s LV PW:         1.30 cm   LV E/e' medial:  17.2 LV IVS:  1.10 cm   LV e' lateral:   7.40 cm/s LVOT diam:     1.90 cm   LV E/e' lateral: 10.1 LV SV:         40 LV SV Index:   23 LVOT Area:     2.84 cm  RIGHT VENTRICLE             IVC RV Basal diam:  4.80 cm     IVC diam: 0.70 cm RV S prime:     11.90 cm/s TAPSE (M-mode): 2.3 cm LEFT ATRIUM             Index        RIGHT ATRIUM           Index LA diam:        3.90 cm 2.23 cm/m   RA Area:     15.90 cm LA Vol (A2C):   52.7 ml 30.17 ml/m  RA Volume:   42.40 ml  24.27 ml/m LA Vol (A4C):   58.0 ml 33.20 ml/m LA Biplane Vol: 56.2 ml 32.17 ml/m  AORTIC VALVE LVOT Vmax:   75.47 cm/s LVOT Vmean:  51.367 cm/s LVOT VTI:    0.142 m  AORTA Ao Root diam: 3.20 cm Ao Asc diam:  3.50 cm MITRAL VALVE MV Area (PHT): 3.78 cm     SHUNTS MV Decel Time: 201 msec     Systemic VTI:  0.14 m MV E velocity: 74.65 cm/s   Systemic Diam: 1.90 cm MV A velocity: 106.00 cm/s MV E/A ratio:  0.70 Kate Sable MD Electronically signed by Kate Sable MD Signature Date/Time: 07/12/2022/11:48:50 AM    Final     Assessment and Plan   62 year old male with past medical history of diabetes mellitus type 2 on insulin, end-stage renal disease on hemodialysis, COPD and recent hospitalization in December for flash pulmonary edema and non-STEMI.  At that time, patient had cardiac cath and found to have heavily calcified LAD and ended up having a drug-eluting stent placement in follow-up at [facility] at the end of the month.  Nephrology and cardiology were consulted on admission.  Patient just discharged on 12/1 for volume overload and hypertensive emergency.  Stabilized with diuresis and dialysis.   Patient recently had been seen in the emergency department on 1/19 for shortness of breath and was treated with bronchitis.  Viral testing at that time was negative.  He was sent home and then on the night of 1/21 developed sudden onset shortness of  breath and found to be in hypertensive urgency and hypoxic despite 4 L nasal cannula.  Patient started on nitroglycerin drip and chest x-ray concerning for pulmonary edema.  Patient admitted to hospitalist service for acute respiratory failure secondary to acute on chronic systolic/diastolic heart failure.  Nephrology consulted and took patient for dialysis and by afternoon of 1/22, blood pressures normalized and by morning of 1/23, patient's oxygen saturations normal on room air. Pt's sugars much improved.     Assessment and Plan: Acute hypoxic respiratory failure (HCC)-resolved as of 07/11/2022  Acute on chronic combined systolic and diastolic CHF (congestive heart failure) ([place]) Most likely secondary to hypertensive urgency with flash pulmonary edema Patient's last known LVEF was 40 to 45% from a 2D echocardiogram which was done 11/23 .  With volume improved through dialysis and Lasix, back to baseline --Initially requiring high flow nasal cannula since he did not tolerate BiPAP.  With diuresis and volume removal, has much improved and almost fully  resolved.  Breathing comfortably on RA --1/24--EF 35-40%-- given recurrent pulmonary edema with hypertension cardiology recommends cardiac cath plan for tomorrow is aware. --1/25--cath today. Pt on RA. No CP   Hyperosmolar hyperglycemic state (HHS) (HCC)-resolved as of 07/11/2022 Noted to have significant hyperglycemia upon arrival without evidence of metabolic acidosis.  Significant hyperglycemia possibly due to recent systemic steroid administration for acute COPD exacerbation.  Patient was placed on an insulin drip which has been discontinued since blood glucose has improved.   --Continue long-acting insulin with sliding scale coverage   End-stage renal disease on hemodialysis [facility]) --Dialysis days are T/TH/S --Patient admitted to the hospital in respiratory distress and found to have pulmonary edema and received dialysis on 1/22 and 1/23.   Appreciate nephrology help   Hypertensive emergency-resolved as of 07/10/2022 Patient with hypertensive emergency and complications of flash pulmonary edema Started on a nitroglycerin drip with improvement in his blood pressure and this has since been discontinued.   --on  multiple antihypertensive medications   CAD S/P percutaneous coronary angioplasty --History of coronary artery disease status post recent left heart cath with stent angioplasty.  Noted to have elevated troponin levels which may be secondary to demand ischemia from hypertensive emergency and acute CHF.  See below   Elevated troponin/NSTEMI --Seen by cardiology and elevated troponins felt to be perfusion mismatch.  Repeat echocardiogram ordered and unchanged, no further cardiology intervention needed.  Echo shws EF of 35-40% .  Troponins are trending downward. --on IV heparin gtt --cardiac cath planned for am 1/25   COPD with acute exacerbation (HCC)-resolved as of 07/11/2022 Place patient on scheduled and as needed bronchodilator therapy Place patient on inhaled steroids Continue oxygen supplementation to maintain pulse oximetry greater than 92% Completed  course of doxycycline prescribed during his last ER visit.   Hyponatremia Pseudohyponatremia to hyperglycemia as well as hypervolemia from CHF.  Improved with treatment of hyperglycemia   History of anemia due to CKD H&H is stable Monitor closely during this hospitalization           Procedures: Family communication :none today Consults : neurology, nephrology CODE STATUS: full DVT Prophylaxis : heparin drip Level of care: Telemetry Cardiac Status is: Inpatient Remains inpatient appropriate because: treatment of congestive heart failure,/pulmonary edema, cardiology planning heart cath today    TOTAL TIME TAKING CARE OF THIS PATIENT: 35 minutes.  >50% time spent on counselling and coordination of care  Note: This dictation was prepared with Dragon  dictation along with smaller phrase technology. Any transcriptional errors that result from this process are unintentional.  Fritzi Mandes M.D    [facility]   CC: Primary care physician; [facility], [place]

## 2022-07-13 NOTE — Progress Notes (Signed)
  Transition of Care [facility]) Screening Note   Patient Details  Name: Jimmy Olson Date of Birth: 1960-11-24   Transition of Care [facility]) CM/SW Contact:    Tiburcio Bash, LCSW Phone Number: 07/13/2022, 9:20 AM    Transition of Care Department [facility]) has reviewed patient and no TOC needs have been identified at this time. We will continue to monitor patient advancement through interdisciplinary progression rounds. If new patient transition needs arise, please place a TOC consult.  Kelby Fam, [place], MSW, [place]

## 2022-07-13 NOTE — Progress Notes (Signed)
[facility]  ROUNDING NOTE   Subjective:   Jimmy Olson  is a 62 year old Hispanic male with past medical conditions including anemia, two-vessel CAD, diabetes, depression, DVT with Eliquis, and end-stage renal disease on hemodialysis.  Patient presents to the emergency department with complaints of shortness of breath and chest pain. Patient has been admitted for Flash pulmonary edema ([place]) [J81.0] Hyperglycemia [R73.9] Acute respiratory failure with hypoxia ([place]) [J96.01] Acute hypoxemic respiratory failure ([place]) [J96.01] Acute on chronic respiratory acidosis (HCC) [J96.02, J96.12] Acute hypoxic respiratory failure ([place]) [J96.01]  Patient is known to our practice and receives outpatient dialysis treatments at [facility] on a TTS schedule, supervised by Dr. Candiss Norse.    Patient laying in bed, no family at bedside Denies chest pain or discomfort at this time Remains on room air Currently n.p.o. for cardiac catheterization.   Objective:  Vital signs in last 24 hours:  Temp:  [98.2 F (36.8 C)-99.2 F (37.3 C)] 99.2 F (37.3 C) (01/25 1155) Pulse Rate:  [79-85] 79 (01/25 1155) Resp:  [15-18] 16 (01/25 1155) BP: (127-168)/(68-78) 156/76 (01/25 1155) SpO2:  [94 %-100 %] 98 % (01/25 1244)  Weight change:  Filed Weights   07/10/22 0409 07/11/22 0752 07/11/22 1203  Weight: 69.6 kg 68.5 kg 66 kg    Intake/Output: I/O last 3 completed shifts: In: 1259.2 [I.V.:1259.2] Out: 725 [Urine:725]   Intake/Output this shift:  Total I/O In: 3 [I.V.:3] Out: -   Physical Exam: General: NAD, resting quietly  Head: Normocephalic, atraumatic. Moist oral mucosal membranes  Eyes: Anicteric  Lungs:  Clear to auscultation, normal effort  Heart: Regular rate and rhythm  Abdomen:  Soft, nontender, nondistended  Extremities: No peripheral edema.  Neurologic: Alert and oriented, moving all four extremities  Skin: No lesions  Access: Right IJ PermCath    Basic  Metabolic Panel: Recent Labs  Lab 07/07/22 2152 07/10/22 0404 07/10/22 1449 07/11/22 0623  NA 133* 129* 130* 133*  K 4.9 5.1 3.1* 3.3*  CL 101 95* 94* 97*  CO2 23 21* 25 30  GLUCOSE 423* 972* 284* 147*  BUN 39* 58* 26* 35*  CREATININE 4.44* 5.29* 2.80* 4.14*  CALCIUM 8.9 9.0 8.2* 8.2*  PHOS  --   --   --  3.5     Liver Function Tests: Recent Labs  Lab 07/07/22 2152 07/10/22 0615  AST 32 39  ALT 16 27  ALKPHOS 97 101  BILITOT 1.1 0.8  PROT 7.2 7.1  ALBUMIN 3.4* 3.6    No results for input(s): "LIPASE", "AMYLASE" in the last 168 hours. No results for input(s): "AMMONIA" in the last 168 hours.  CBC: Recent Labs  Lab 07/07/22 2152 07/10/22 0404 07/11/22 0128 07/12/22 0512 07/13/22 0546  WBC 5.6 13.9* 9.0 5.4 3.8*  NEUTROABS 3.7  --   --   --   --   HGB 9.0* 10.2* 8.1* 8.5* 8.7*  HCT 26.5* 30.7* 24.1* 25.3* 24.9*  MCV 89.5 93.6 90.3 91.3 88.0  PLT 210 284 173 155 159     Cardiac Enzymes: No results for input(s): "CKTOTAL", "CKMB", "CKMBINDEX", "TROPONINI" in the last 168 hours.  BNP: Invalid input(s): "POCBNP"  CBG: Recent Labs  Lab 07/12/22 1632 07/12/22 2201 07/13/22 0517 07/13/22 0757 07/13/22 1157  GLUCAP 268* 222* 253* 215* 55*     Microbiology: Results for orders placed or performed during the hospital encounter of 07/10/22  Resp panel by RT-PCR (RSV, Flu A&B, Covid) Anterior Nasal Swab     Status:  None   Collection Time: 07/10/22  6:49 AM   Specimen: Anterior Nasal Swab  Result Value Ref Range Status   SARS Coronavirus 2 by RT PCR NEGATIVE NEGATIVE Final    Comment: (NOTE) SARS-CoV-2 target nucleic acids are NOT DETECTED.  The SARS-CoV-2 RNA is generally detectable in upper respiratory specimens during the acute phase of infection. The lowest concentration of SARS-CoV-2 viral copies this assay can detect is 138 copies/mL. A negative result does not preclude SARS-Cov-2 infection and should not be used as the sole basis for  treatment or other patient management decisions. A negative result may occur with  improper specimen collection/handling, submission of specimen other than nasopharyngeal swab, presence of viral mutation(s) within the areas targeted by this assay, and inadequate number of viral copies(<138 copies/mL). A negative result must be combined with clinical observations, patient history, and epidemiological information. The expected result is Negative.  Fact Sheet for Patients:  EntrepreneurPulse.com.au  Fact Sheet for Healthcare Providers:  IncredibleEmployment.be  This test is no t yet approved or cleared by the [place] FDA and  has been authorized for detection and/or diagnosis of SARS-CoV-2 by FDA under an Emergency Use Authorization (EUA). This EUA will remain  in effect (meaning this test can be used) for the duration of the COVID-19 declaration under Section 564(b)(1) of the Act, 21 U.S.C.section 360bbb-3(b)(1), unless the authorization is terminated  or revoked sooner.       Influenza A by PCR NEGATIVE NEGATIVE Final   Influenza B by PCR NEGATIVE NEGATIVE Final    Comment: (NOTE) The Xpert Xpress SARS-CoV-2/FLU/RSV plus assay is intended as an aid in the diagnosis of influenza from Nasopharyngeal swab specimens and should not be used as a sole basis for treatment. Nasal washings and aspirates are unacceptable for Xpert Xpress SARS-CoV-2/FLU/RSV testing.  Fact Sheet for Patients: EntrepreneurPulse.com.au  Fact Sheet for Healthcare Providers: IncredibleEmployment.be  This test is not yet approved or cleared by the [place] FDA and has been authorized for detection and/or diagnosis of SARS-CoV-2 by FDA under an Emergency Use Authorization (EUA). This EUA will remain in effect (meaning this test can be used) for the duration of the COVID-19 declaration under Section 564(b)(1) of the Act, 21  U.S.C. section 360bbb-3(b)(1), unless the authorization is terminated or revoked.     Resp Syncytial Virus by PCR NEGATIVE NEGATIVE Final    Comment: (NOTE) Fact Sheet for Patients: EntrepreneurPulse.com.au  Fact Sheet for Healthcare Providers: IncredibleEmployment.be  This test is not yet approved or cleared by the [place] FDA and has been authorized for detection and/or diagnosis of SARS-CoV-2 by FDA under an Emergency Use Authorization (EUA). This EUA will remain in effect (meaning this test can be used) for the duration of the COVID-19 declaration under Section 564(b)(1) of the Act, 21 U.S.C. section 360bbb-3(b)(1), unless the authorization is terminated or revoked.  Performed at [facility], [address] [place], [place] [address]     Coagulation Studies: Recent Labs    07/10/22 1630  LABPROT 14.1  INR 1.1     Urinalysis: Recent Labs    07/11/22 0127  COLORURINE STRAW*  LABSPEC 1.009  PHURINE 7.0  GLUCOSEU >=500*  HGBUR SMALL*  BILIRUBINUR NEGATIVE  KETONESUR NEGATIVE  PROTEINUR >=300*  NITRITE NEGATIVE  LEUKOCYTESUR NEGATIVE       Imaging: ECHOCARDIOGRAM COMPLETE  Result Date: 07/12/2022    ECHOCARDIOGRAM REPORT   Patient Name:   Jimmy Olson Date of Exam: 07/11/2022 Medical Rec #:  [id number]  Height:       66.0 in Accession #:    [id number]    Weight:       145.5 lb Date of Birth:  [birth year]     BSA:          1.747 m Patient Age:    51 years      BP:           145/77 mmHg Patient Gender: M             HR:           81 bpm. Exam Location:  [facility] Procedure: 2D Echo, Cardiac Doppler and Color Doppler Indications:     R07.9 Chest pain  History:         Patient has prior history of Echocardiogram examinations, most                  recent 05/18/2022. Previous Myocardial Infarction and CAD, TIA;                  Risk Factors:Hypertension, Dyslipidemia and Diabetes. End Stage                  Renal  Disease.  Sonographer:     Wilford Sports Rodgers-Jones RDCS Referring Phys:  [id number] SWFUXNAT A ARIDA Diagnosing Phys: Kate Sable MD IMPRESSIONS  1. Left ventricular ejection fraction, by estimation, is 35 to 40%. The left ventricle has moderately decreased function. The left ventricle demonstrates global hypokinesis. There is mild left ventricular hypertrophy. Left ventricular diastolic parameters are consistent with Grade I diastolic dysfunction (impaired relaxation).  2. Right ventricular systolic function is normal. The right ventricular size is normal.  3. The mitral valve is normal in structure. Mild mitral valve regurgitation.  4. The aortic valve was not well visualized. Aortic valve regurgitation is not visualized. Aortic valve sclerosis/calcification is present, without any evidence of aortic stenosis.  5. The inferior vena cava is normal in size with greater than 50% respiratory variability, suggesting right atrial pressure of 3 mmHg. FINDINGS  Left Ventricle: Left ventricular ejection fraction, by estimation, is 35 to 40%. The left ventricle has moderately decreased function. The left ventricle demonstrates global hypokinesis. The left ventricular internal cavity size was normal in size. There is mild left ventricular hypertrophy. Left ventricular diastolic parameters are consistent with Grade I diastolic dysfunction (impaired relaxation). Right Ventricle: The right ventricular size is normal. No increase in right ventricular wall thickness. Right ventricular systolic function is normal. Left Atrium: Left atrial size was normal in size. Right Atrium: Right atrial size was normal in size. Pericardium: There is no evidence of pericardial effusion. Mitral Valve: The mitral valve is normal in structure. Mild mitral valve regurgitation. Tricuspid Valve: The tricuspid valve is normal in structure. Tricuspid valve regurgitation is not demonstrated. Aortic Valve: The aortic valve was not well visualized. Aortic  valve regurgitation is not visualized. Aortic valve sclerosis/calcification is present, without any evidence of aortic stenosis. Pulmonic Valve: The pulmonic valve was normal in structure. Pulmonic valve regurgitation is not visualized. Aorta: The aortic root and ascending aorta are structurally normal, with no evidence of dilitation. Venous: The inferior vena cava is normal in size with greater than 50% respiratory variability, suggesting right atrial pressure of 3 mmHg. IAS/Shunts: No atrial level shunt detected by color flow Doppler.  LEFT VENTRICLE PLAX 2D LVIDd:         5.50 cm   Diastology LVIDs:         4.00 cm  LV e' medial:    4.35 cm/s LV PW:         1.30 cm   LV E/e' medial:  17.2 LV IVS:        1.10 cm   LV e' lateral:   7.40 cm/s LVOT diam:     1.90 cm   LV E/e' lateral: 10.1 LV SV:         40 LV SV Index:   23 LVOT Area:     2.84 cm  RIGHT VENTRICLE             IVC RV Basal diam:  4.80 cm     IVC diam: 0.70 cm RV S prime:     11.90 cm/s TAPSE (M-mode): 2.3 cm LEFT ATRIUM             Index        RIGHT ATRIUM           Index LA diam:        3.90 cm 2.23 cm/m   RA Area:     15.90 cm LA Vol (A2C):   52.7 ml 30.17 ml/m  RA Volume:   42.40 ml  24.27 ml/m LA Vol (A4C):   58.0 ml 33.20 ml/m LA Biplane Vol: 56.2 ml 32.17 ml/m  AORTIC VALVE LVOT Vmax:   75.47 cm/s LVOT Vmean:  51.367 cm/s LVOT VTI:    0.142 m  AORTA Ao Root diam: 3.20 cm Ao Asc diam:  3.50 cm MITRAL VALVE MV Area (PHT): 3.78 cm     SHUNTS MV Decel Time: 201 msec     Systemic VTI:  0.14 m MV E velocity: 74.65 cm/s   Systemic Diam: 1.90 cm MV A velocity: 106.00 cm/s MV E/A ratio:  0.70 Kate Sable MD Electronically signed by Kate Sable MD Signature Date/Time: 07/12/2022/11:48:50 AM    Final      Medications:    [HYI Hold] sodium chloride     sodium chloride     sodium chloride 10 mL/hr at 07/13/22 1139   heparin Stopped (07/13/22 1208)    aspirin  81 mg Oral Pre-Cath   [MAR Hold] aspirin EC  81 mg Oral Daily    [MAR Hold] carvedilol  25 mg Oral BID   [MAR Hold] Chlorhexidine Gluconate Cloth  6 each Topical Q0600   [MAR Hold] clopidogrel  75 mg Oral Daily   [MAR Hold] escitalopram  10 mg Oral QHS   [MAR Hold] feeding supplement (NEPRO CARB STEADY)  237 mL Oral TID BM   [MAR Hold] hydrALAZINE  100 mg Oral TID   [MAR Hold] insulin aspart  0-6 Units Subcutaneous TID WC   [MAR Hold] insulin detemir  15 Units Subcutaneous QHS   [MAR Hold] isosorbide mononitrate  60 mg Oral BID   [MAR Hold] losartan  100 mg Oral Daily   [MAR Hold] pantoprazole  20 mg Oral Daily   [MAR Hold] rosuvastatin  40 mg Oral Daily   [MAR Hold] sodium chloride flush  3 mL Intravenous Q12H   [MAR Hold] sodium chloride flush  3 mL Intravenous Q12H   [MAR Hold] torsemide  20 mg Oral Once per day on Sun Sat   [facility] Hold] sodium chloride, sodium chloride, [MAR Hold] acetaminophen, [MAR Hold] albuterol, [MAR Hold] dextrose, [MAR Hold] guaiFENesin-dextromethorphan, Heparin (Porcine) in NaCl, [MAR Hold] nitroGLYCERIN, [MAR Hold] ondansetron **OR** [MAR Hold] ondansetron (ZOFRAN) IV, [MAR Hold] mouth rinse, [MAR Hold] sodium chloride flush, sodium chloride flush  Assessment/ Plan:  Mr.  Jimmy Olson is a 62 y.o.  male with past medical conditions including anemia, two-vessel CAD, diabetes, depression, DVT with Eliquis, and end-stage renal disease on hemodialysis.  Patient presents to the emergency department with complaints of shortness of breath and chest pain. Patient has been admitted for Flash pulmonary edema ([place]) [J81.0] Hyperglycemia [R73.9] Acute respiratory failure with hypoxia ([place]) [J96.01] Acute hypoxemic respiratory failure (HCC) [J96.01] Acute on chronic respiratory acidosis (HCC) [J96.02, J96.12] Acute hypoxic respiratory failure ([place]) [J96.01]  [facility]/TTS/right chest PermCath  End-stage renal disease on hemodialysis.  Will plan for dialysis after cardiac procedure today..   2. Anemia of chronic  kidney disease Lab Results  Component Value Date   HGB 8.7 (L) 07/13/2022    Patient receives [place] outpatient.  Hemoglobin acceptable at this time.   3. Secondary Hyperparathyroidism: with outpatient labs: PTH 387, phosphorus 5.1, calcium 9.1 on 06/29/22.   Lab Results  Component Value Date   CALCIUM 8.2 (L) 07/11/2022   CAION 1.19 05/26/2022   PHOS 3.5 07/11/2022    Will continue to monitor bone minerals during this admission.  4. Diabetes mellitus type II with chronic kidney disease/renal manifestations: insulin dependent. Home regimen includes NovoLog, Humulog, Levemir. Most recent hemoglobin A1c is 10.1 on 05/17/22   Sliding scale insulin managed by primary team.   LOS: 3 [place] 1/25/202412:47 PM

## 2022-07-13 NOTE — Progress Notes (Signed)
Pt ran for 2hrs 50min. Signed off AMA.  C/o chest pain and headache  placed on 2L o2.  CN at bedside     07/13/22 1900  Vitals  Temp 98 F (36.7 C)  Temp Source Oral  BP 132/71  MAP (mmHg) 90  BP Location Left Arm  BP Method Automatic  Patient Position (if appropriate) Lying  Pulse Rate 77  Pulse Rate Source Monitor  ECG Heart Rate 76  Resp 17  Oxygen Therapy  SpO2 100 %  O2 Device Nasal Cannula  O2 Flow Rate (L/min) 2 L/min  Patient Activity (if Appropriate) In bed  Pulse Oximetry Type Continuous  During Treatment Monitoring  HD Safety Checks Performed Yes  Intra-Hemodialysis Comments Tx completed (pt signed ama 51 min early)  Post Treatment  Dialyzer Clearance Lightly streaked  Duration of HD Treatment -hour(s) 2.39 hour(s)  Liters Processed 62.8  Fluid Removed (mL) 1700 mL  Tolerated HD Treatment Yes  Hemodialysis Catheter Right Internal jugular Double lumen Permanent (Tunneled)  Placement Date/Time: 06/02/21 1429   Time Out: Correct patient;Correct site;Correct procedure  Maximum sterile barrier precautions: Hand hygiene;Cap;Mask;Sterile gown;Sterile gloves;Large sterile sheet  Site Prep: Chlorhexidine (preferred)  Local Anes...  Site Condition No complications  Blue Lumen Status Heparin locked  Red Lumen Status Heparin locked  Purple Lumen Status N/A  Catheter fill solution Heparin 1000 units/ml  Catheter fill volume (Arterial) 1.9 cc  Catheter fill volume (Venous) 1.9  Dressing Type Transparent  Dressing Status Antimicrobial disc in place;Clean, Dry, Intact  Drainage Description None  Dressing Change Due 07/18/22  Post treatment catheter status Capped and Clamped

## 2022-07-13 NOTE — Care Management Important Message (Signed)
Important Message  Patient Details  Name: Jimmy Olson MRN: [id number] Date of Birth: 1960/08/14   Medicare Important Message Given:  Yes  Patient out of room upon time of visit for procedure.  Copy of Medicare IM left in room on beside tray for reference.    Dannette Barbara 07/13/2022, 2:54 PM

## 2022-07-13 NOTE — Interval H&P Note (Signed)
History and Physical Interval Note:  07/13/2022 12:38 PM  Jimmy Olson  has presented today for surgery, with the diagnosis of nstemi with known CAD (s/p LAD PCI).  The various methods of treatment have been discussed with the patient and family. After consideration of risks, benefits and other options for treatment, the patient has consented to  Procedure(s): LEFT HEART CATH AND CORONARY ANGIOGRAPHY (N/A)  PERCUTANEOUS CORONARY INTERVENTION  as a surgical intervention.  The patient's history has been reviewed, patient examined, no change in status, stable for surgery.  I have reviewed the patient's chart and labs.  Questions were answered to the patient's satisfaction.    Cath Lab Visit (complete for each Cath Lab visit)  Clinical Evaluation Leading to the Procedure:   ACS: Yes.    Non-ACS:    Anginal Classification: CCS III  Anti-ischemic medical therapy: Maximal Therapy (2 or more classes of medications)  Non-Invasive Test Results: No non-invasive testing performed  Prior CABG: No previous CABG    Glenetta Hew

## 2022-07-13 NOTE — Consult Note (Signed)
[place] for heparin drip  Indication: ACS/STEMI  No Known Allergies  Patient Measurements: Height: 5\' 6"  (167.6 cm) Weight: 66 kg (145 lb 8.1 oz) IBW/kg (Calculated) : 63.8 Heparin Dosing Weight: 66 kg  Vital Signs: Temp: 98.2 F (36.8 C) (01/25 0215) Temp Source: Oral (01/25 0131) BP: 134/75 (01/25 0215) Pulse Rate: 82 (01/25 0215)  Labs: Recent Labs    07/10/22 1449 07/10/22 1630 07/11/22 0127 07/11/22 0128 07/11/22 0623 07/11/22 1548 07/11/22 1928 07/12/22 0157 07/12/22 0512 07/12/22 1050 07/12/22 1850 07/13/22 0546  HGB  --   --    < > 8.1*  --   --   --   --  8.5*  --   --  8.7*  HCT  --   --   --  24.1*  --   --   --   --  25.3*  --   --  24.9*  PLT  --   --   --  173  --   --   --   --  155  --   --  159  APTT  --  23*  --   --   --   --   --   --   --   --   --   --   LABPROT  --  14.1  --   --   --   --   --   --   --   --   --   --   INR  --  1.1  --   --   --   --   --   --   --   --   --   --   HEPARINUNFRC  --  <0.10*   < >  --   --    < >  --    < >  --  0.41 0.37 0.56  CREATININE 2.80*  --   --   --  4.14*  --   --   --   --   --   --   --   TROPONINIHS 6,193*  --   --   --   --   --  9,102*  --  7,911*  --   --   --    < > = values in this interval not displayed.     Estimated Creatinine Clearance: 17.3 mL/min (A) (by C-G formula based on SCr of 4.14 mg/dL (H)).   Medical History: Past Medical History:  Diagnosis Date   Anemia    Anxiety and depression    ATN (acute tubular necrosis) (HCC)    CAD (coronary artery disease)    a. 06/2020 Cath: Nonobs dzs; b. 05/2021 NSTEMI/Cath: Nonobs dzs->demand isch; c. 05/2022 Cath: LM mild dzs, LAD 40ost/p, 69m, D1 70, RI mild dzs, LCX small, mild dzs, RCA 20p, 51m/d, RPDA 40; d. 05/2022 Staged PCI: Orbital atherectomy of mLAD  (2.75x48 Synergy XD DES).   Chronic HFrEF (heart failure with reduced ejection fraction) ([place])    a. 08/2019 Echo: EF 45-50%; b. 06/2020 Echo:  EF 30-35%; c. 04/2022 Echo: EF 40-45%, mild LVH, small apical region of noncompaction, GrII DD, nl RV fxn.   DVT of axillary vein, acute left ([place]) 05/2021   a.) Tx'd with apixaban   ESRD (end stage renal disease) on dialysis [facility])    a.) M-W-F   History of 2019 novel coronavirus disease (COVID-19) 03/06/2020   a.)  s/p Tx with monoclonal Ab infusion   HLD (hyperlipidemia)    Hyperkalemia 12/23/2020   Hypertension    Left carotid artery stenosis 08/18/2021   a.) Carotid Doppler 24/02/7352: 29-92% LICA stenosis   Long term current use of anticoagulant    a.) apixaban for DVT   Mixed Ischemic & Nonischemic Cardiomyopathy    a. 08/2019 Echo: EF 45-50%; b. 06/2020 Echo: EF 30-35%; c. 04/2022 Echo: EF 40-45%, mild LVH, small apical region of noncompaction, GrII DD, nl RV fxn.   NSTEMI (non-ST elevated myocardial infarction) ([place]) 07/15/2020   Sepsis ([place])    Suicidal ideations    TIA (transient ischemic attack) 06/2021   Type 2 diabetes mellitus treated with insulin (HCC)     Medications:  Patient on Eliquis in the past for upper extremity DVT.  Most recent clinic notes indicate that Eliquis was stopped.  Assessment: 62 yo male presented to the ED with complaint of shortness of breath.  Patient has history of CAD.  Found to have elevated troponin in the ED.   Baseline: aPTT and PT/INR pending  Goal of Therapy:  Heparin level 0.3-0.7 units/ml Monitor platelets by anticoagulation protocol: Yes  1/23 @ 0127 HL 0.36, therapeutic x 1 1/23 @ 1548 HL 0.26, subtherapeutic @ 850 units/hr 1/24 0157 HL 0.27, subtherapeutic 1/24 @ 1050 HL 0.41, therapeutic x 1 1/24 @1850  HL 0.37 1/25 0546 HL 0.56, therapeutic x 3  Plan: Heparin remains therapeutic  Continue heparin drip at 1100 units/hr Recheck HL and CBC with AM labs  Renda Rolls, PharmD, [facility] 07/13/2022 6:33 AM

## 2022-07-13 NOTE — Progress Notes (Signed)
Pt c/o chest pain. Denies sob.  Requesting to come off tx 45 min early.   Bfr dec,  fluid removal stopped and placed on 2L o2.  CN made aware

## 2022-07-14 DIAGNOSIS — E785 Hyperlipidemia, unspecified: Secondary | ICD-10-CM

## 2022-07-14 DIAGNOSIS — R110 Nausea: Secondary | ICD-10-CM | POA: Diagnosis not present

## 2022-07-14 DIAGNOSIS — I255 Ischemic cardiomyopathy: Secondary | ICD-10-CM | POA: Diagnosis not present

## 2022-07-14 DIAGNOSIS — N186 End stage renal disease: Secondary | ICD-10-CM | POA: Diagnosis not present

## 2022-07-14 DIAGNOSIS — R7989 Other specified abnormal findings of blood chemistry: Secondary | ICD-10-CM

## 2022-07-14 DIAGNOSIS — I5043 Acute on chronic combined systolic (congestive) and diastolic (congestive) heart failure: Secondary | ICD-10-CM | POA: Diagnosis not present

## 2022-07-14 DIAGNOSIS — J9601 Acute respiratory failure with hypoxia: Secondary | ICD-10-CM | POA: Diagnosis not present

## 2022-07-14 DIAGNOSIS — R739 Hyperglycemia, unspecified: Principal | ICD-10-CM

## 2022-07-14 DIAGNOSIS — R197 Diarrhea, unspecified: Secondary | ICD-10-CM

## 2022-07-14 LAB — GLUCOSE, CAPILLARY
Glucose-Capillary: 209 mg/dL — ABNORMAL HIGH (ref 70–99)
Glucose-Capillary: 163 mg/dL — ABNORMAL HIGH (ref 70–99)
Glucose-Capillary: 119 mg/dL — ABNORMAL HIGH (ref 70–99)
Glucose-Capillary: 142 mg/dL — ABNORMAL HIGH (ref 70–99)

## 2022-07-14 LAB — CBC
HCT: 27.1 % — ABNORMAL LOW (ref 39.0–52.0)
Hemoglobin: 9.5 g/dL — ABNORMAL LOW (ref 13.0–17.0)
MCH: 30.9 pg (ref 26.0–34.0)
MCHC: 35.1 g/dL (ref 30.0–36.0)
MCV: 88.3 fL (ref 80.0–100.0)
Platelets: 178 10*3/uL (ref 150–400)
RBC: 3.07 MIL/uL — ABNORMAL LOW (ref 4.22–5.81)
RDW: 12.8 % (ref 11.5–15.5)
WBC: 4.3 10*3/uL (ref 4.0–10.5)
nRBC: 0 % (ref 0.0–0.2)

## 2022-07-14 MED ORDER — EZETIMIBE 10 MG PO TABS
10.0000 mg | ORAL_TABLET | Freq: Every day | ORAL | Status: DC
  Administered 2022-07-14 – 2022-07-15 (×2): 10 mg via ORAL
  Filled 2022-07-14 (×2): qty 1

## 2022-07-14 MED ORDER — ROSUVASTATIN CALCIUM 10 MG PO TABS
10.0000 mg | ORAL_TABLET | Freq: Every day | ORAL | Status: DC
  Administered 2022-07-14 – 2022-07-15 (×2): 10 mg via ORAL
  Filled 2022-07-14 (×2): qty 1

## 2022-07-14 MED ORDER — DIPHENOXYLATE-ATROPINE 2.5-0.025 MG PO TABS: 1.0000 | ORAL_TABLET | Freq: Four times a day (QID) | ORAL | Status: DC | PRN

## 2022-07-14 MED ORDER — INSULIN DETEMIR 100 UNIT/ML ~~LOC~~ SOLN
22.0000 [IU] | Freq: Every day | SUBCUTANEOUS | Status: DC
  Administered 2022-07-14: 22 [IU] via SUBCUTANEOUS
  Filled 2022-07-14 (×2): qty 0.22

## 2022-07-14 MED ORDER — LOPERAMIDE HCL 2 MG PO CAPS
2.0000 mg | ORAL_CAPSULE | ORAL | Status: DC | PRN
  Administered 2022-07-15: 2 mg via ORAL
  Filled 2022-07-14 (×2): qty 1

## 2022-07-14 NOTE — Progress Notes (Addendum)
Rounding Note    Patient Name: Jimmy Olson Date of Encounter: 07/14/2022  [place] Cardiologist: Ida Rogue, MD   Subjective   Cardiac catheterization yesterday, nonobstructive disease noted, stent patent Etiology of chest pain felt unrelated to ischemia This morning reports he has general malaise, fatigue, diarrhea x 5 episodes this morning, some nausea, receiving Zofran Has not had much to eat for breakfast Family at the bedside  Inpatient Medications    Scheduled Meds:  aspirin EC  81 mg Oral Daily   carvedilol  25 mg Oral BID   Chlorhexidine Gluconate Cloth  6 each Topical Q0600   clopidogrel  75 mg Oral Daily   escitalopram  10 mg Oral QHS   ezetimibe  10 mg Oral Daily   feeding supplement (NEPRO CARB STEADY)  237 mL Oral TID BM   heparin  5,000 Units Subcutaneous Q8H   hydrALAZINE  100 mg Oral TID   insulin aspart  0-6 Units Subcutaneous TID WC   insulin detemir  22 Units Subcutaneous QHS   isosorbide mononitrate  60 mg Oral BID   losartan  100 mg Oral Daily   pantoprazole  20 mg Oral Daily   rosuvastatin  10 mg Oral Daily   [START ON 07/15/2022] torsemide  20 mg Oral Once per day on Sun Sat   Continuous Infusions:  PRN Meds: acetaminophen, acetaminophen, albuterol, dextrose, guaiFENesin-dextromethorphan, nitroGLYCERIN, ondansetron **OR** ondansetron (ZOFRAN) IV, mouth rinse   Vital Signs    Vitals:   07/13/22 1956 07/13/22 2338 07/14/22 0424 07/14/22 0819  BP: (!) 169/78 (!) 148/69 (!) 160/88 138/76  Pulse: 78 85 82 77  Resp: 16 16 16 17   Temp: 98.5 F (36.9 C) 98.4 F (36.9 C) 98.2 F (36.8 C) 98 F (36.7 C)  TempSrc:  Oral    SpO2: 99% 96% 99% 99%  Weight:      Height:        Intake/Output Summary (Last 24 hours) at 07/14/2022 1056 Last data filed at 07/13/2022 1911 Gross per 24 hour  Intake 98 ml  Output 1700 ml  Net -1602 ml      07/13/2022    7:10 PM 07/13/2022    4:16 PM 07/11/2022   12:03 PM  Last 3 Weights  Weight  (lbs) 145 lb 1 oz 149 lb 7.6 oz 145 lb 8.1 oz  Weight (kg) 65.8 kg 67.8 kg 66 kg      Telemetry    Normal sinus rhythm- Personally Reviewed  ECG    - Personally Reviewed  Physical Exam   GEN: No acute distress.   Neck: No JVD Cardiac: RRR, no murmurs, rubs, or gallops.  Respiratory: Clear to auscultation bilaterally. GI: Soft, nontender, non-distended  MS: No edema; No deformity. Neuro:  Nonfocal  Psych: Normal affect   Labs    High Sensitivity Troponin:   Recent Labs  Lab 07/10/22 0615 07/10/22 0857 07/10/22 1449 07/11/22 1928 07/12/22 0512  TROPONINIHS 644* 1,723* 6,193* 9,102* 7,911*     Chemistry Recent Labs  Lab 07/07/22 2152 07/10/22 0404 07/10/22 0615 07/10/22 1449 07/11/22 0623 07/13/22 1323 07/13/22 1542  NA 133* 129*  --  130* 133* 131*  --   K 4.9 5.1  --  3.1* 3.3* 3.9  --   CL 101 95*  --  94* 97*  --   --   CO2 23 21*  --  25 30  --   --   GLUCOSE 423* 972*  --  284* 147*  --   --  BUN 39* 58*  --  26* 35*  --   --   CREATININE 4.44* 5.29*  --  2.80* 4.14*  --  5.85*  CALCIUM 8.9 9.0  --  8.2* 8.2*  --   --   PROT 7.2  --  7.1  --   --   --   --   ALBUMIN 3.4*  --  3.6  --   --   --   --   AST 32  --  39  --   --   --   --   ALT 16  --  27  --   --   --   --   ALKPHOS 97  --  101  --   --   --   --   BILITOT 1.1  --  0.8  --   --   --   --   GFRNONAA 14* 12*  --  25* 16*  --  10*  ANIONGAP 9 13  --  11 6  --   --     Lipids No results for input(s): "CHOL", "TRIG", "HDL", "LABVLDL", "LDLCALC", "CHOLHDL" in the last 168 hours.  Hematology Recent Labs  Lab 07/13/22 0546 07/13/22 1323 07/13/22 1542 07/14/22 0541  WBC 3.8*  --  3.7* 4.3  RBC 2.83*  --  2.94* 3.07*  HGB 8.7* 8.2* 9.1* 9.5*  HCT 24.9* 24.0* 25.8* 27.1*  MCV 88.0  --  87.8 88.3  MCH 30.7  --  31.0 30.9  MCHC 34.9  --  35.3 35.1  RDW 12.8  --  12.9 12.8  PLT 159  --  181 178   Thyroid No results for input(s): "TSH", "FREET4" in the last 168 hours.  BNP Recent Labs   Lab 07/10/22 0404  BNP >4,500.0*    DDimer No results for input(s): "DDIMER" in the last 168 hours.   Radiology    CARDIAC CATHETERIZATION  Result Date: 07/13/2022   Ost LAD to Prox LAD lesion is 50% stenosed.  Prox LAD lesion is 40% stenosed.   Prox LAD to Mid LAD stent is widely patent with minimal-5% stenosed.   1st Diag lesion is 45% stenosed.   Prox RCA lesion is 20% stenosed.  Mid RCA lesion is 30% stenosed.  Dist RCA lesion is 40% stenosed.  RPDA lesion is 40% stenosed.   LV end diastolic pressure is severely elevated.   There is no aortic valve stenosis. Post-Cath Diagnoses Widely patent LAD stent with otherwise minimal CAD from last catheterization Severely elevated LVEDP of 28 mmHg indicating pressure overload (acute On Chronic Combined Systolic and Diastolic Heart Failure). Suspect this is the etiology for elevated troponin levels. Would not call this a non-ST elevation myocardial infarction. Recommendations: Systemic blood pressure control and volume removal with dialysis. Glenetta Hew, MD    Cardiac Studies   Cardiac catheterization July 13, 2022  Ost LAD to Prox LAD lesion is 50% stenosed.  Prox LAD lesion is 40% stenosed.   Prox LAD to Mid LAD stent is widely patent with minimal-5% stenosed.   1st Diag lesion is 45% stenosed.   Prox RCA lesion is 20% stenosed.  Mid RCA lesion is 30% stenosed.  Dist RCA lesion is 40% stenosed.  RPDA lesion is 40% stenosed.   LV end diastolic pressure is severely elevated.   There is no aortic valve stenosis.   Post-Cath Diagnoses Widely patent LAD stent with otherwise minimal CAD from last catheterization Severely elevated LVEDP of 28  mmHg indicating pressure overload (acute On Chronic Combined Systolic and Diastolic Heart Failure). Suspect this is the etiology for elevated troponin levels. Would not call this a non-ST elevation myocardial infarction.      Recommendations: Systemic blood pressure control and volume removal with  dialysis.  Echocardiogram July 11, 2022   1. Left ventricular ejection fraction, by estimation, is 35 to 40%. The  left ventricle has moderately decreased function. The left ventricle  demonstrates global hypokinesis. There is mild left ventricular  hypertrophy. Left ventricular diastolic  parameters are consistent with Grade I diastolic dysfunction (impaired  relaxation).   2. Right ventricular systolic function is normal. The right ventricular  size is normal.   3. The mitral valve is normal in structure. Mild mitral valve  regurgitation.   4. The aortic valve was not well visualized. Aortic valve regurgitation  is not visualized. Aortic valve sclerosis/calcification is present,  without any evidence of aortic stenosis.   5. The inferior vena cava is normal in size with greater than 50%  respiratory variability, suggesting right atrial pressure of 3 mmHg.   Patient Profile     62 year old male with history of CAD/PCI to mid LAD 05/2022: Cardiomyopathy EF 40 to 45%, DVT, end-stage renal disease on hemodialysis presenting with shortness of breath and volume overload. Being seen for NSTEMI and cardiomyopathy.   Assessment & Plan    Elevated troponin, supply/demand mismatch in the setting of hypervolemia Recent catheterization with stent placed to LAD December 2023 Catheterization yesterday for peak troponin of 9000 Patent stent, nonobstructive disease Left ventricular end-diastolic pressure 28 indicating pressure overload -He terminated dialysis early yesterday secondary to chest pain Chest pain likely from coronary spasm/atypical in nature given catheterization results -Would use nitro sublingual for chest pain  Cardiomyopathy, ischemic and nonischemic Ejection fraction 35 to 40% Catheterization performed yesterday Plan is to continue carvedilol, hydralazine, Imdur, losartan Volume control with hemodialysis  Nausea, diarrhea Started this morning, etiology  unclear Receiving Zofran for symptom relief  Hyperlipidemia Pharmacy has requested max dose Crestor 10 mg in the setting of end-stage renal disease Zetia added (nausea, diarrhea started before Zetia was given)   Total encounter time more than 50 minutes  Greater than 50% was spent in counseling and coordination of care with the patient   For questions or updates, please contact Will Please consult [email or web address] for contact info under     Signed, Esmond Plants, MD, Ph.D [facility]

## 2022-07-14 NOTE — Progress Notes (Signed)
[place] [facility] [place] NAME: Niquan Charnley    MR#:  [id number]  DATE OF BIRTH:  1961-04-28  SUBJECTIVE:   Wife at bedside during my evaluation.  Diarrhea x 3 today some nausea improved with zofran  VITALS:  Blood pressure (!) 149/77, pulse 73, temperature 98.3 F (36.8 C), temperature source Oral, resp. rate 16, height 5\' 6"  (1.676 m), weight 65.8 kg, SpO2 97 %.  PHYSICAL EXAMINATION:   GENERAL:  62 y.o.-year-old patient lying in the bed with no acute distress. Chronically ill LUNGS: Normal breath sounds bilaterally, no wheezing CARDIOVASCULAR: S1, S2 normal. No murmur   ABDOMEN: Soft, nontender, nondistended.  EXTREMITIES: No  edema b/l.   HD access NEUROLOGIC: nonfocal  patient is alert and awake SKIN: No obvious rash, lesion, or ulcer.   LABORATORY PANEL:  CBC Recent Labs  Lab 07/14/22 0541  WBC 4.3  HGB 9.5*  HCT 27.1*  PLT 178     Chemistries  Recent Labs  Lab 07/10/22 0615 07/10/22 1449 07/11/22 0623 07/13/22 1323 07/13/22 1542  NA  --    < > 133* 131*  --   K  --    < > 3.3* 3.9  --   CL  --    < > 97*  --   --   CO2  --    < > 30  --   --   GLUCOSE  --    < > 147*  --   --   BUN  --    < > 35*  --   --   CREATININE  --    < > 4.14*  --  5.85*  CALCIUM  --    < > 8.2*  --   --   AST 39  --   --   --   --   ALT 27  --   --   --   --   ALKPHOS 101  --   --   --   --   BILITOT 0.8  --   --   --   --    < > = values in this interval not displayed.    Cardiac Enzymes No results for input(s): "TROPONINI" in the last 168 hours. RADIOLOGY:  CARDIAC CATHETERIZATION  Result Date: 07/13/2022   Ost LAD to Prox LAD lesion is 50% stenosed.  Prox LAD lesion is 40% stenosed.   Prox LAD to Mid LAD stent is widely patent with minimal-5% stenosed.   1st Diag lesion is 45% stenosed.   Prox RCA lesion is 20% stenosed.  Mid RCA lesion is 30% stenosed.  Dist RCA lesion is 40% stenosed.  RPDA lesion is 40% stenosed.   LV end  diastolic pressure is severely elevated.   There is no aortic valve stenosis. Post-Cath Diagnoses Widely patent LAD stent with otherwise minimal CAD from last catheterization Severely elevated LVEDP of 28 mmHg indicating pressure overload (acute On Chronic Combined Systolic and Diastolic Heart Failure). Suspect this is the etiology for elevated troponin levels. Would not call this a non-ST elevation myocardial infarction. Recommendations: Systemic blood pressure control and volume removal with dialysis. Glenetta Hew, MD      62 year old male with past medical history of diabetes mellitus type 2 on insulin, end-stage renal disease on hemodialysis, COPD and recent hospitalization in December for flash pulmonary edema and non-STEMI.  At that time, patient had cardiac cath and found to have heavily calcified LAD  and ended up having a drug-eluting stent placement in follow-up at [facility] at the end of the month.  Nephrology and cardiology were consulted on admission.  Patient just discharged on 12/1 for volume overload and hypertensive emergency.      Assessment and Plan: Acute hypoxic respiratory failure (HCC)-resolved as of 07/11/2022  Acute on chronic combined systolic and diastolic CHF (congestive heart failure) ([place]) Most likely secondary to hypertensive urgency with flash pulmonary edema Patient's last known LVEF was 40 to 45% from a 2D echocardiogram which was done 11/23 .  With volume improved through dialysis and Lasix, back to baseline --Initially requiring high flow nasal cannula since he did not tolerate BiPAP.  With diuresis and volume removal, has much improved and almost fully resolved.  Breathing comfortably on RA --1/24--EF 35-40%-- given recurrent pulmonary edema with hypertension cardiology recommends cardiac cath plan for tomorrow is aware. --1/25--cath today. Pt on RA. No CP   Hyperosmolar hyperglycemic state (HHS) (HCC)-resolved as of 07/11/2022 Noted to have significant  hyperglycemia upon arrival without evidence of metabolic acidosis.  Significant hyperglycemia possibly due to recent systemic steroid administration for acute COPD exacerbation.  Patient was placed on an insulin drip which has been discontinued since blood glucose has improved.   --Continue long-acting insulin with sliding scale coverage   End-stage renal disease on hemodialysis [facility]) --Dialysis days are T/TH/S --Patient admitted to the hospital in respiratory distress and found to have pulmonary edema and received dialysis on 1/22 and 1/23.  Appreciate nephrology help   HTN cont home meds   CAD S/P percutaneous coronary angioplasty --History of coronary artery disease status post recent left heart cath with stent angioplasty.  Noted to have elevated troponin levels which may be secondary to demand ischemia from hypertensive emergency and acute CHF.  See below   Elevated troponin due to demand ischemia --Seen by cardiology and elevated troponins felt to be perfusion mismatch.  Repeat echocardiogram ordered and unchanged, no further cardiology intervention needed.  Echo shws EF of 35-40% .  Troponins are trending downward. --on IV heparin gtt --cardiac cath on 1/25-- Widely patent LAD stent with otherwise minimal CAD from last catheterization Severely elevated LVEDP of 28 mmHg indicating pressure overload (acute On Chronic Combined Systolic and Diastolic Heart Failure). Suspect this is the etiology for elevated troponin levels. Would not call this a non-ST elevation myocardial infarction.    COPD with acute exacerbation (HCC)-resolved as of 07/11/2022 Place patient on scheduled and as needed bronchodilator therapy Place patient on inhaled steroids Continue oxygen supplementation to maintain pulse oximetry greater than 92% Completed  course of doxycycline prescribed during his last ER visit.   Hyponatremia Pseudohyponatremia to hyperglycemia as well as hypervolemia from CHF.  Improved with  treatment of hyperglycemia   History of anemia due to CKD H&H is stable Monitor closely during this hospitalization  Diarrhea --prn lomotil      pt reports feeling weak today. Wants to get HD done tomorrow and hoping to dischrge   Procedures: cath Family communication :wife Consults : neurology, nephrology CODE STATUS: full DVT Prophylaxis : Heparin Level of care: Telemetry Cardiac Status is: Inpatient Remains inpatient appropriate because: treatment of congestive heart failure,/pulmonary edema Diarrhea today.  TOTAL TIME TAKING CARE OF THIS PATIENT: 35 minutes.  >50% time spent on counselling and coordination of care  Note: This dictation was prepared with Dragon dictation along with smaller phrase technology. Any transcriptional errors that result from this process are unintentional.  Norville Haggard.D  [facility]   CC: Primary care physician; Center, [place]

## 2022-07-14 NOTE — Progress Notes (Signed)
[facility]  ROUNDING NOTE   Subjective:   Jimmy Olson  is a 62 year old Hispanic male with past medical conditions including anemia, two-vessel CAD, diabetes, depression, DVT with Eliquis, and end-stage renal disease on hemodialysis.  Patient presents to the emergency department with complaints of shortness of breath and chest pain. Patient has been admitted for Flash pulmonary edema ([place]) [J81.0] Hyperglycemia [R73.9] Acute respiratory failure with hypoxia ([place]) [J96.01] Acute hypoxemic respiratory failure ([place]) [J96.01] Acute on chronic respiratory acidosis (HCC) [J96.02, J96.12] Acute hypoxic respiratory failure ([place]) [J96.01]  Patient is known to our practice and receives outpatient dialysis treatments at [facility] on a TTS schedule, supervised by Dr. Candiss Norse.    Patient seen laying in bed, no family at bedside Reports multiple episodes of diarrhea this morning Denies chest pain   Objective:  Vital signs in last 24 hours:  Temp:  [97.5 F (36.4 C)-98.5 F (36.9 C)] 98.3 F (36.8 C) (01/26 1202) Pulse Rate:  [73-85] 73 (01/26 1202) Resp:  [12-18] 16 (01/26 1202) BP: (110-184)/(65-89) 149/77 (01/26 1202) SpO2:  [95 %-100 %] 97 % (01/26 1202) Weight:  [65.8 kg-67.8 kg] 65.8 kg (01/25 1910)  Weight change:  Filed Weights   07/11/22 1203 07/13/22 1616 07/13/22 1910  Weight: 66 kg 67.8 kg 65.8 kg    Intake/Output: I/O last 3 completed shifts: In: 1360.2 [I.V.:1360.2] Out: 1700 [Other:1700]   Intake/Output this shift:  No intake/output data recorded.  Physical Exam: General: NAD, resting quietly  Head: Normocephalic, atraumatic. Moist oral mucosal membranes  Eyes: Anicteric  Lungs:  Clear to auscultation, normal effort  Heart: Regular rate and rhythm  Abdomen:  Soft, nontender, nondistended  Extremities: No peripheral edema.  Neurologic: Alert and oriented, moving all four extremities  Skin: No lesions  Access: Right IJ PermCath     Basic Metabolic Panel: Recent Labs  Lab 07/07/22 2152 07/10/22 0404 07/10/22 1449 07/11/22 0623 07/13/22 1323 07/13/22 1542  NA 133* 129* 130* 133* 131*  --   K 4.9 5.1 3.1* 3.3* 3.9  --   CL 101 95* 94* 97*  --   --   CO2 23 21* 25 30  --   --   GLUCOSE 423* 972* 284* 147*  --   --   BUN 39* 58* 26* 35*  --   --   CREATININE 4.44* 5.29* 2.80* 4.14*  --  5.85*  CALCIUM 8.9 9.0 8.2* 8.2*  --   --   PHOS  --   --   --  3.5  --   --      Liver Function Tests: Recent Labs  Lab 07/07/22 2152 07/10/22 0615  AST 32 39  ALT 16 27  ALKPHOS 97 101  BILITOT 1.1 0.8  PROT 7.2 7.1  ALBUMIN 3.4* 3.6    No results for input(s): "LIPASE", "AMYLASE" in the last 168 hours. No results for input(s): "AMMONIA" in the last 168 hours.  CBC: Recent Labs  Lab 07/07/22 2152 07/10/22 0404 07/11/22 0128 07/12/22 0512 07/13/22 0546 07/13/22 1323 07/13/22 1542 07/14/22 0541  WBC 5.6   < > 9.0 5.4 3.8*  --  3.7* 4.3  NEUTROABS 3.7  --   --   --   --   --   --   --   HGB 9.0*   < > 8.1* 8.5* 8.7* 8.2* 9.1* 9.5*  HCT 26.5*   < > 24.1* 25.3* 24.9* 24.0* 25.8* 27.1*  MCV 89.5   < > 90.3 91.3  88.0  --  87.8 88.3  PLT 210   < > 173 155 159  --  181 178   < > = values in this interval not displayed.     Cardiac Enzymes: No results for input(s): "CKTOTAL", "CKMB", "CKMBINDEX", "TROPONINI" in the last 168 hours.  BNP: Invalid input(s): "POCBNP"  CBG: Recent Labs  Lab 07/13/22 1157 07/13/22 1548 07/13/22 2314 07/14/22 0820 07/14/22 1204  GLUCAP 283* 252* 265* 209* 163*     Microbiology: Results for orders placed or performed during the hospital encounter of 07/10/22  Resp panel by RT-PCR (RSV, Flu A&B, Covid) Anterior Nasal Swab     Status: None   Collection Time: 07/10/22  6:49 AM   Specimen: Anterior Nasal Swab  Result Value Ref Range Status   SARS Coronavirus 2 by RT PCR NEGATIVE NEGATIVE Final    Comment: (NOTE) SARS-CoV-2 target nucleic acids are NOT  DETECTED.  The SARS-CoV-2 RNA is generally detectable in upper respiratory specimens during the acute phase of infection. The lowest concentration of SARS-CoV-2 viral copies this assay can detect is 138 copies/mL. A negative result does not preclude SARS-Cov-2 infection and should not be used as the sole basis for treatment or other patient management decisions. A negative result may occur with  improper specimen collection/handling, submission of specimen other than nasopharyngeal swab, presence of viral mutation(s) within the areas targeted by this assay, and inadequate number of viral copies(<138 copies/mL). A negative result must be combined with clinical observations, patient history, and epidemiological information. The expected result is Negative.  Fact Sheet for Patients:  EntrepreneurPulse.com.au  Fact Sheet for Healthcare Providers:  IncredibleEmployment.be  This test is no t yet approved or cleared by the [place] FDA and  has been authorized for detection and/or diagnosis of SARS-CoV-2 by FDA under an Emergency Use Authorization (EUA). This EUA will remain  in effect (meaning this test can be used) for the duration of the COVID-19 declaration under Section 564(b)(1) of the Act, 21 U.S.C.section 360bbb-3(b)(1), unless the authorization is terminated  or revoked sooner.       Influenza A by PCR NEGATIVE NEGATIVE Final   Influenza B by PCR NEGATIVE NEGATIVE Final    Comment: (NOTE) The Xpert Xpress SARS-CoV-2/FLU/RSV plus assay is intended as an aid in the diagnosis of influenza from Nasopharyngeal swab specimens and should not be used as a sole basis for treatment. Nasal washings and aspirates are unacceptable for Xpert Xpress SARS-CoV-2/FLU/RSV testing.  Fact Sheet for Patients: EntrepreneurPulse.com.au  Fact Sheet for Healthcare Providers: IncredibleEmployment.be  This test is not yet  approved or cleared by the [place] FDA and has been authorized for detection and/or diagnosis of SARS-CoV-2 by FDA under an Emergency Use Authorization (EUA). This EUA will remain in effect (meaning this test can be used) for the duration of the COVID-19 declaration under Section 564(b)(1) of the Act, 21 U.S.C. section 360bbb-3(b)(1), unless the authorization is terminated or revoked.     Resp Syncytial Virus by PCR NEGATIVE NEGATIVE Final    Comment: (NOTE) Fact Sheet for Patients: EntrepreneurPulse.com.au  Fact Sheet for Healthcare Providers: IncredibleEmployment.be  This test is not yet approved or cleared by the [place] FDA and has been authorized for detection and/or diagnosis of SARS-CoV-2 by FDA under an Emergency Use Authorization (EUA). This EUA will remain in effect (meaning this test can be used) for the duration of the COVID-19 declaration under Section 564(b)(1) of the Act, 21 U.S.C. section 360bbb-3(b)(1), unless the authorization is terminated  or revoked.  Performed at [facility], [address] [place], [place] [address]     Coagulation Studies: No results for input(s): "LABPROT", "INR" in the last 72 hours.   Urinalysis: No results for input(s): "COLORURINE", "LABSPEC", "PHURINE", "GLUCOSEU", "HGBUR", "BILIRUBINUR", "KETONESUR", "PROTEINUR", "UROBILINOGEN", "NITRITE", "LEUKOCYTESUR" in the last 72 hours.  Invalid input(s): "APPERANCEUR"     Imaging: CARDIAC CATHETERIZATION  Result Date: 07/13/2022   Ost LAD to Prox LAD lesion is 50% stenosed.  Prox LAD lesion is 40% stenosed.   Prox LAD to Mid LAD stent is widely patent with minimal-5% stenosed.   1st Diag lesion is 45% stenosed.   Prox RCA lesion is 20% stenosed.  Mid RCA lesion is 30% stenosed.  Dist RCA lesion is 40% stenosed.  RPDA lesion is 40% stenosed.   LV end diastolic pressure is severely elevated.   There is no aortic valve stenosis.  Post-Cath Diagnoses Widely patent LAD stent with otherwise minimal CAD from last catheterization Severely elevated LVEDP of 28 mmHg indicating pressure overload (acute On Chronic Combined Systolic and Diastolic Heart Failure). Suspect this is the etiology for elevated troponin levels. Would not call this a non-ST elevation myocardial infarction. Recommendations: Systemic blood pressure control and volume removal with dialysis. Glenetta Hew, MD     Medications:      aspirin EC  81 mg Oral Daily   carvedilol  25 mg Oral BID   Chlorhexidine Gluconate Cloth  6 each Topical Q0600   clopidogrel  75 mg Oral Daily   escitalopram  10 mg Oral QHS   ezetimibe  10 mg Oral Daily   feeding supplement (NEPRO CARB STEADY)  237 mL Oral TID BM   heparin  5,000 Units Subcutaneous Q8H   hydrALAZINE  100 mg Oral TID   insulin aspart  0-6 Units Subcutaneous TID WC   insulin detemir  22 Units Subcutaneous QHS   isosorbide mononitrate  60 mg Oral BID   losartan  100 mg Oral Daily   pantoprazole  20 mg Oral Daily   rosuvastatin  10 mg Oral Daily   [START ON 07/15/2022] torsemide  20 mg Oral Once per day on Sun Sat   acetaminophen, acetaminophen, albuterol, dextrose, diphenoxylate-atropine, guaiFENesin-dextromethorphan, loperamide, nitroGLYCERIN, ondansetron **OR** ondansetron (ZOFRAN) IV, mouth rinse  Assessment/ Plan:  Jimmy Olson is a 62 y.o.  male with past medical conditions including anemia, two-vessel CAD, diabetes, depression, DVT with Eliquis, and end-stage renal disease on hemodialysis.  Patient presents to the emergency department with complaints of shortness of breath and chest pain. Patient has been admitted for Flash pulmonary edema ([place]) [J81.0] Hyperglycemia [R73.9] Acute respiratory failure with hypoxia ([place]) [J96.01] Acute hypoxemic respiratory failure (HCC) [J96.01] Acute on chronic respiratory acidosis (HCC) [J96.02, J96.12] Acute hypoxic respiratory failure ([place]) [J96.01]  [facility]  [facility]/TTS/right chest PermCath  End-stage renal disease on hemodialysis.  Received dialysis yesterday, UF 1.7 L achieved.  Patient terminated treatment early due to chest discomfort.  Next treatment scheduled for Saturday.  Will defer discharge planning to primary team.  2. Anemia of chronic kidney disease Lab Results  Component Value Date   HGB 9.5 (L) 07/14/2022    Patient receives [place] outpatient.  Hemoglobin stable.  3. Secondary Hyperparathyroidism: with outpatient labs: PTH 387, phosphorus 5.1, calcium 9.1 on 06/29/22.   Lab Results  Component Value Date   CALCIUM 8.2 (L) 07/11/2022   CAION 1.19 07/13/2022   PHOS 3.5 07/11/2022    Calcium and phosphorus within desired  range.  Will continue to monitor during this admission.  4. Diabetes mellitus type II with chronic kidney disease/renal manifestations: insulin dependent. Home regimen includes NovoLog, Humulog, Levemir. Most recent hemoglobin A1c is 10.1 on 05/17/22   Glucose slightly elevated, primary team to manage sliding scale insulin.   LOS: [place] 1/26/20241:38 PM

## 2022-07-15 DIAGNOSIS — J9601 Acute respiratory failure with hypoxia: Secondary | ICD-10-CM | POA: Diagnosis not present

## 2022-07-15 LAB — LIPOPROTEIN A (LPA): Lipoprotein (a): 69.3 nmol/L — ABNORMAL HIGH (ref <75.0)

## 2022-07-15 LAB — GLUCOSE, CAPILLARY
Glucose-Capillary: 47 mg/dL — ABNORMAL LOW (ref 70–99)
Glucose-Capillary: 191 mg/dL — ABNORMAL HIGH (ref 70–99)
Glucose-Capillary: 155 mg/dL — ABNORMAL HIGH (ref 70–99)

## 2022-07-15 MED ORDER — HEPARIN SODIUM (PORCINE) 1000 UNIT/ML IJ SOLN
INTRAMUSCULAR | Status: AC
  Filled 2022-07-15: qty 10

## 2022-07-15 MED ORDER — EZETIMIBE 10 MG PO TABS: 10.0000 mg | ORAL_TABLET | Freq: Every day | ORAL | 1 refills | Status: DC

## 2022-07-15 MED ORDER — ROSUVASTATIN CALCIUM 10 MG PO TABS: 10.0000 mg | ORAL_TABLET | Freq: Every day | ORAL | 1 refills | Status: DC

## 2022-07-15 MED ORDER — DEXTROSE 50 % IV SOLN
25.0000 g | INTRAVENOUS | Status: AC
  Administered 2022-07-15: 25 g via INTRAVENOUS

## 2022-07-15 NOTE — TOC Transition Note (Signed)
Transition of Care [facility]) - CM/SW Discharge Note   Patient Details  Name: Jimmy Olson MRN: [id number] Date of Birth: August 27, 1960  [facility] CM/SW Contact:  Izola Price, RN Phone Number: 07/15/2022, 1:16 PM   Clinical Narrative: 1/27: DC orders in for today to home/self care. Had HD today. Goes to [facility] on a TTS schedule, supervised by Dr. Candiss Norse. Simmie Davies RN CM            Final next level of care: Home/Self Care Barriers to Discharge: Barriers Resolved   Patient Goals and CMS Choice      Discharge Placement                         Discharge Plan and Services Additional resources added to the After Visit Summary for                  DME Arranged: N/A DME Agency: NA       HH Arranged: NA HH Agency: NA        Social Determinants of Health (SDOH) Interventions SDOH Screenings   Food Insecurity: No Food Insecurity (07/13/2022)  Housing: Low Risk  (07/13/2022)  Transportation Needs: No Transportation Needs (07/13/2022)  Utilities: Not At Risk (07/13/2022)  Depression (PHQ2-9): Low Risk  (11/29/2021)  Tobacco Use: Low Risk  (07/13/2022)     Readmission Risk Interventions    10/03/2021    2:32 PM 08/08/2021    8:49 AM 06/01/2021    2:05 PM  Readmission Risk Prevention Plan  Transportation Screening Complete Complete Complete  PCP or Specialist Appt within 3-5 Days Complete Complete Complete  HRI or Home Care Consult  Complete Complete  Social Work Consult for [place] Planning/Counseling Complete Complete Complete  Palliative Care Screening Not Applicable Not Applicable Not Applicable  Medication Review Press photographer) Complete Complete Referral to Pharmacy

## 2022-07-15 NOTE — Discharge Summary (Signed)
Physician Discharge Summary   Patient: Jimmy Olson MRN: [id number] DOB: [birth year]  Admit date:     07/10/2022  Discharge date: 07/15/22  Discharge Physician: Fritzi Mandes   PCP: [facility] [place]   Recommendations at discharge:   follow-up cardiology Dr. Rockey Situ in 1 to 2 weeks follow-up PCP in 1 to 2 weeks  Discharge Diagnoses: Active Problems:   End-stage renal disease on hemodialysis (HCC)   CAD S/P percutaneous coronary angioplasty   Elevated troponin   History of anemia due to CKD   Hyponatremia   Acute respiratory failure with hypoxia (HCC)   Ischemic cardiomyopathy   Acute on chronic respiratory acidosis (HCC)   Acute hypoxemic respiratory failure (HCC)   Hyperglycemia  62 year old male with past medical history of diabetes mellitus type 2 on insulin, end-stage renal disease on hemodialysis, COPD and recent hospitalization in December for flash pulmonary edema and non-STEMI.  At that time, patient had cardiac cath and found to have heavily calcified LAD and ended up having a drug-eluting stent placement in follow-up at [facility] at the end of the month.  Nephrology and cardiology were consulted on admission.  Patient just discharged on 12/1 for volume overload and hypertensive emergency.      Assessment and Plan: Acute hypoxic respiratory failure (HCC)-resolved as of 07/11/2022  Acute on chronic combined systolic and diastolic CHF (congestive heart failure) ([place]) Most likely secondary to hypertensive urgency with flash pulmonary edema Patient's last known LVEF was 40 to 45% from a 2D echocardiogram which was done 11/23 .  With volume improved through dialysis and Lasix, back to baseline -- Breathing comfortably on RA --1/24--EF 35-40%-- given recurrent pulmonary edema with hypertension cardiology recommends cardiac cath plan for tomorrow is aware. --1/25--cath today. Pt on RA. No CP --cath results as below   Hyperosmolar hyperglycemic state  (HHS) (HCC)-resolved as of 07/11/2022 Noted to have significant hyperglycemia upon arrival without evidence of metabolic acidosis.  Significant hyperglycemia possibly due to recent systemic steroid administration for acute COPD exacerbation.  Patient was placed on an insulin drip which has been discontinued since blood glucose has improved.   --Continue long-acting insulin with sliding scale coverage   End-stage renal disease on hemodialysis ([place]) --Dialysis days are T/TH/S -- Appreciate nephrology help with inpt HD and UF   HTN cont home meds   CAD S/P percutaneous coronary angioplasty --History of coronary artery disease status post recent left heart cath with stent angioplasty.  Noted to have elevated troponin levels which may be secondary to demand ischemia from hypertensive emergency and acute CHF.  See below   Elevated troponin due to demand ischemia --Seen by cardiology and elevated troponins felt to be perfusion mismatch.  Repeat echocardiogram ordered and unchanged, no further cardiology intervention needed.  Echo shows EF of 35-40% .  Troponins are trending downward. --on IV heparin gtt --cardiac cath on 1/25-- Widely patent LAD stent with otherwise minimal CAD from last catheterization. Severely elevated LVEDP of 28 mmHg indicating pressure overload (acute On Chronic Combined Systolic and Diastolic Heart Failure). Suspect this is the etiology for elevated troponin levels. Would not call this a non-ST elevation myocardial infarction.      Hyponatremia Pseudohyponatremia to hyperglycemia as well as hypervolemia from CHF.  Improved with treatment of hyperglycemia   History of anemia due to CKD H&H is stable   Diarrhea --prn lomotil  --improved   patient overall feels better. Will discharge him to home after dialysis. Patient in agreement.  Procedures: cath Family communication :wife 1/26 Consults : neurology, nephrology CODE STATUS: full DVT Prophylaxis : Heparin       Disposition: home Diet recommendation:  Discharge Diet Orders (From admission, onward)     Start     Ordered   07/15/22 0000  Diet - low sodium heart healthy        07/15/22 1215           Cardiac diet and carb control DISCHARGE MEDICATION: Allergies as of 07/15/2022   No Known Allergies      Medication List     STOP taking these medications    Comfort EZ Pen Needles 32G X 4 MM Misc Generic drug: Insulin Pen Needle   doxycycline 100 MG capsule Commonly known as: VIBRAMYCIN   predniSONE 20 MG tablet Commonly known as: DELTASONE       TAKE these medications    acetaminophen 325 MG tablet Commonly known as: TYLENOL Take 650 mg by mouth every 6 (six) hours as needed.   albuterol 108 (90 Base) MCG/ACT inhaler Commonly known as: Proventil HFA Inhale 2 puffs into the lungs every 4 (four) hours as needed for wheezing or shortness of breath. What changed: Another medication with the same name was removed. Continue taking this medication, and follow the directions you see here.   aspirin EC 81 MG tablet Take 1 tablet (81 mg total) by mouth daily. Swallow whole.   carvedilol 25 MG tablet Commonly known as: COREG Tome 1 tableta (25 mg en total) por va oral 2 (dos) veces al [employer]. (Take 1 tablet (25 mg total) by mouth 2 (two) times daily.)   clopidogrel 75 MG tablet Commonly known as: PLAVIX Tome 1 tableta (75 mg en total) por va oral diariamente. (Take 1 tablet (75 mg total) by mouth daily.)   escitalopram 10 MG tablet Commonly known as: LEXAPRO Tome 1 tableta (10 mg en total) por va oral antes de acostarse. (Take 1 tablet (10 mg total) by mouth at bedtime.)   ezetimibe 10 MG tablet Commonly known as: ZETIA Take 1 tablet (10 mg total) by mouth daily.   feeding supplement (NEPRO CARB STEADY) Liqd Take 237 mLs by mouth 3 (three) times daily between meals.   hydrALAZINE 100 MG tablet Commonly known as: APRESOLINE Take 1 tablet (100 mg total) by mouth  2 (two) times daily.   isosorbide mononitrate 30 MG 24 hr tablet Commonly known as: IMDUR Tome 1 tableta (30 mg en total) por va oral por la maana y al acostarse. (Take 1 tablet (30 mg total) by mouth in the morning and at bedtime.)   Levemir FlexPen 100 UNIT/ML FlexPen Generic drug: insulin detemir Inject 15 Units into the skin at bedtime.   losartan 100 MG tablet Commonly known as: COZAAR Tome 1 tableta (100 mg en total) por va oral diariamente. (Take 1 tablet (100 mg total) by mouth daily.)   nitroGLYCERIN 0.4 MG SL tablet Commonly known as: NITROSTAT Place 1 tablet (0.4 mg total) under the tongue every 5 (five) minutes as needed for chest pain.   NovoLOG FlexPen 100 UNIT/ML FlexPen Generic drug: insulin aspart Inject 0-15 Units into the skin 3 (three) times daily with meals. 0-15 units tid 0-5 units qhs   pantoprazole 20 MG tablet Commonly known as: PROTONIX Tome 1 tableta (20 mg en total) por va oral diariamente. (Take 1 tablet (20 mg total) by mouth daily.)   rosuvastatin 10 MG tablet Commonly known as: CRESTOR Take 1 tablet (10 mg total)  by mouth daily. What changed:  medication strength how much to take   torsemide 20 MG tablet Commonly known as: DEMADEX Take 1 tablet (20 mg total) by mouth daily. Takes on Sat and [place], [place]. Schedule an appointment as soon as possible for a visit.   Specialty: General Practice Contact information: [place] [id number] [phone]         Minna Merritts, MD. Schedule an appointment as soon as possible for a visit in 1 week(s).   Specialty: Cardiology Why: [facility] Contact information: [place] [id number] [place] [employer]ights   07/13/22 1616 07/13/22 1910 07/15/22 0941  Weight: 67.8 kg 65.8 kg 63.9 kg     Condition at discharge: fair  The results  of significant diagnostics from this hospitalization (including imaging, microbiology, ancillary and laboratory) are listed below for reference.   Imaging Studies: CARDIAC CATHETERIZATION  Result Date: 07/13/2022   Ost LAD to Prox LAD lesion is 50% stenosed.  Prox LAD lesion is 40% stenosed.   Prox LAD to Mid LAD stent is widely patent with minimal-5% stenosed.   1st Diag lesion is 45% stenosed.   Prox RCA lesion is 20% stenosed.  Mid RCA lesion is 30% stenosed.  Dist RCA lesion is 40% stenosed.  RPDA lesion is 40% stenosed.   LV end diastolic pressure is severely elevated.   There is no aortic valve stenosis. Post-Cath Diagnoses Widely patent LAD stent with otherwise minimal CAD from last catheterization Severely elevated LVEDP of 28 mmHg indicating pressure overload (acute On Chronic Combined Systolic and Diastolic Heart Failure). Suspect this is the etiology for elevated troponin levels. Would not call this a non-ST elevation myocardial infarction. Recommendations: Systemic blood pressure control and volume removal with dialysis. Glenetta Hew, MD   ECHOCARDIOGRAM COMPLETE  Result Date: 07/12/2022    ECHOCARDIOGRAM REPORT   Patient Name:   Jimmy Olson Date of Exam: 07/11/2022 Medical Rec #:  [id number]     Height:       66.0 in Accession #:    [id number]    Weight:       145.5 lb Date of Birth:  1960/07/05     BSA:          1.747 m Patient Age:    49 years      BP:           145/77 mmHg Patient Gender: M             HR:           81 bpm. Exam Location:  [facility] Procedure: 2D Echo, Cardiac Doppler and Color Doppler Indications:     R07.9 Chest pain  History:         Patient has prior history of Echocardiogram examinations, most                  recent 05/18/2022. Previous Myocardial Infarction and CAD, TIA;                  Risk Factors:Hypertension, Dyslipidemia and Diabetes. End Stage                  Renal Disease.  Sonographer:     Wilford Sports Rodgers-Jones RDCS Referring Phys:  [place]  ARIDA  Diagnosing Phys: Kate Sable MD IMPRESSIONS  1. Left ventricular ejection fraction, by estimation, is 35 to 40%. The left ventricle has moderately decreased function. The left ventricle demonstrates global hypokinesis. There is mild left ventricular hypertrophy. Left ventricular diastolic parameters are consistent with Grade I diastolic dysfunction (impaired relaxation).  2. Right ventricular systolic function is normal. The right ventricular size is normal.  3. The mitral valve is normal in structure. Mild mitral valve regurgitation.  4. The aortic valve was not well visualized. Aortic valve regurgitation is not visualized. Aortic valve sclerosis/calcification is present, without any evidence of aortic stenosis.  5. The inferior vena cava is normal in size with greater than 50% respiratory variability, suggesting right atrial pressure of 3 mmHg. FINDINGS  Left Ventricle: Left ventricular ejection fraction, by estimation, is 35 to 40%. The left ventricle has moderately decreased function. The left ventricle demonstrates global hypokinesis. The left ventricular internal cavity size was normal in size. There is mild left ventricular hypertrophy. Left ventricular diastolic parameters are consistent with Grade I diastolic dysfunction (impaired relaxation). Right Ventricle: The right ventricular size is normal. No increase in right ventricular wall thickness. Right ventricular systolic function is normal. Left Atrium: Left atrial size was normal in size. Right Atrium: Right atrial size was normal in size. Pericardium: There is no evidence of pericardial effusion. Mitral Valve: The mitral valve is normal in structure. Mild mitral valve regurgitation. Tricuspid Valve: The tricuspid valve is normal in structure. Tricuspid valve regurgitation is not demonstrated. Aortic Valve: The aortic valve was not well visualized. Aortic valve regurgitation is not visualized. Aortic valve sclerosis/calcification is present, without  any evidence of aortic stenosis. Pulmonic Valve: The pulmonic valve was normal in structure. Pulmonic valve regurgitation is not visualized. Aorta: The aortic root and ascending aorta are structurally normal, with no evidence of dilitation. Venous: The inferior vena cava is normal in size with greater than 50% respiratory variability, suggesting right atrial pressure of 3 mmHg. IAS/Shunts: No atrial level shunt detected by color flow Doppler.  LEFT VENTRICLE PLAX 2D LVIDd:         5.50 cm   Diastology LVIDs:         4.00 cm   LV e' medial:    4.35 cm/s LV PW:         1.30 cm   LV E/e' medial:  17.2 LV IVS:        1.10 cm   LV e' lateral:   7.40 cm/s LVOT diam:     1.90 cm   LV E/e' lateral: 10.1 LV SV:         40 LV SV Index:   23 LVOT Area:     2.84 cm  RIGHT VENTRICLE             IVC RV Basal diam:  4.80 cm     IVC diam: 0.70 cm RV S prime:     11.90 cm/s TAPSE (M-mode): 2.3 cm LEFT ATRIUM             Index        RIGHT ATRIUM           Index LA diam:        3.90 cm 2.23 cm/m   RA Area:     15.90 cm LA Vol (A2C):   52.7 ml 30.17 ml/m  RA Volume:   42.40 ml  24.27 ml/m LA Vol (A4C):   58.0 ml 33.20 ml/m LA Biplane Vol: 56.2 ml  32.17 ml/m  AORTIC VALVE LVOT Vmax:   75.47 cm/s LVOT Vmean:  51.367 cm/s LVOT VTI:    0.142 m  AORTA Ao Root diam: 3.20 cm Ao Asc diam:  3.50 cm MITRAL VALVE MV Area (PHT): 3.78 cm     SHUNTS MV Decel Time: 201 msec     Systemic VTI:  0.14 m MV E velocity: 74.65 cm/s   Systemic Diam: 1.90 cm MV A velocity: 106.00 cm/s MV E/A ratio:  0.70 Kate Sable MD Electronically signed by Kate Sable MD Signature Date/Time: 07/12/2022/11:48:50 AM    Final    CT HEAD WO CONTRAST (5MM)  Result Date: 07/10/2022 CLINICAL DATA:  Chest pain and shortness of breath. EXAM: CT HEAD WITHOUT CONTRAST TECHNIQUE: Contiguous axial images were obtained from the base of the skull through the vertex without intravenous contrast. RADIATION DOSE REDUCTION: This exam was performed according to the  departmental dose-optimization program which includes automated exposure control, adjustment of the mA and/or kV according to patient size and/or use of iterative reconstruction technique. COMPARISON:  04/04/2022 FINDINGS: Brain: No evidence of acute infarction, hemorrhage, hydrocephalus, extra-axial collection or mass lesion/mass effect. Vascular: No hyperdense vessel or unexpected calcification. Skull: Normal. Negative for fracture or focal lesion. Sinuses/Orbits: No acute finding. Other: Generalized fairly mild motion artifact IMPRESSION: Negative motion degraded head CT. Electronically Signed   By: Jorje Guild M.D.   On: 07/10/2022 06:43   DG Chest Port 1 View  Result Date: 07/10/2022 CLINICAL DATA:  Chest pain EXAM: PORTABLE CHEST 1 VIEW COMPARISON:  Three days ago FINDINGS: Worsening interstitial opacity with Kerley lines and fissure thickening. Chronic cardiopericardial enlargement with stable mediastinal contours including hilar vessel congestion. Dialysis catheter on the right in good position. No pneumothorax. IMPRESSION: Worsening pulmonary edema. Electronically Signed   By: Jorje Guild M.D.   On: 07/10/2022 04:44   DG Chest Portable 1 View  Result Date: 07/07/2022 CLINICAL DATA:  Chest pain with cough EXAM: PORTABLE CHEST 1 VIEW COMPARISON:  Chest x-ray 05/22/2022 FINDINGS: Right chest port catheter tip projects over the cavoatrial junction. The heart is enlarged, unchanged. There central pulmonary vascular congestion with interstitial prominence in the lung bases. There is no focal lung consolidation, pleural effusion or pneumothorax. No acute fractures are seen. IMPRESSION: Cardiomegaly with central pulmonary vascular congestion and interstitial prominence in the lung bases, likely mild pulmonary edema. Electronically Signed   By: Ronney Asters M.D.   On: 07/07/2022 22:10    Microbiology: Results for orders placed or performed during the hospital encounter of 07/10/22  Resp panel by  RT-PCR (RSV, Flu A&B, Covid) Anterior Nasal Swab     Status: None   Collection Time: 07/10/22  6:49 AM   Specimen: Anterior Nasal Swab  Result Value Ref Range Status   SARS Coronavirus 2 by RT PCR NEGATIVE NEGATIVE Final    Comment: (NOTE) SARS-CoV-2 target nucleic acids are NOT DETECTED.  The SARS-CoV-2 RNA is generally detectable in upper respiratory specimens during the acute phase of infection. The lowest concentration of SARS-CoV-2 viral copies this assay can detect is 138 copies/mL. A negative result does not preclude SARS-Cov-2 infection and should not be used as the sole basis for treatment or other patient management decisions. A negative result may occur with  improper specimen collection/handling, submission of specimen other than nasopharyngeal swab, presence of viral mutation(s) within the areas targeted by this assay, and inadequate number of viral copies(<138 copies/mL). A negative result must be combined with clinical observations, patient history, and  epidemiological information. The expected result is Negative.  Fact Sheet for Patients:  EntrepreneurPulse.com.au  Fact Sheet for Healthcare Providers:  IncredibleEmployment.be  This test is no t yet approved or cleared by the [place] FDA and  has been authorized for detection and/or diagnosis of SARS-CoV-2 by FDA under an Emergency Use Authorization (EUA). This EUA will remain  in effect (meaning this test can be used) for the duration of the COVID-19 declaration under Section 564(b)(1) of the Act, 21 U.S.C.section 360bbb-3(b)(1), unless the authorization is terminated  or revoked sooner.       Influenza A by PCR NEGATIVE NEGATIVE Final   Influenza B by PCR NEGATIVE NEGATIVE Final    Comment: (NOTE) The Xpert Xpress SARS-CoV-2/FLU/RSV plus assay is intended as an aid in the diagnosis of influenza from Nasopharyngeal swab specimens and should not be used as a sole basis  for treatment. Nasal washings and aspirates are unacceptable for Xpert Xpress SARS-CoV-2/FLU/RSV testing.  Fact Sheet for Patients: EntrepreneurPulse.com.au  Fact Sheet for Healthcare Providers: IncredibleEmployment.be  This test is not yet approved or cleared by the [place] FDA and has been authorized for detection and/or diagnosis of SARS-CoV-2 by FDA under an Emergency Use Authorization (EUA). This EUA will remain in effect (meaning this test can be used) for the duration of the COVID-19 declaration under Section 564(b)(1) of the Act, 21 U.S.C. section 360bbb-3(b)(1), unless the authorization is terminated or revoked.     Resp Syncytial Virus by PCR NEGATIVE NEGATIVE Final    Comment: (NOTE) Fact Sheet for Patients: EntrepreneurPulse.com.au  Fact Sheet for Healthcare Providers: IncredibleEmployment.be  This test is not yet approved or cleared by the [place] FDA and has been authorized for detection and/or diagnosis of SARS-CoV-2 by FDA under an Emergency Use Authorization (EUA). This EUA will remain in effect (meaning this test can be used) for the duration of the COVID-19 declaration under Section 564(b)(1) of the Act, 21 U.S.C. section 360bbb-3(b)(1), unless the authorization is terminated or revoked.  Performed at [facility], [address] [place], [place] [address]     Labs: CBC: Recent Labs  Lab 07/11/22 0128 07/12/22 0512 07/13/22 0546 07/13/22 1323 07/13/22 1542 07/14/22 0541  WBC 9.0 5.4 3.8*  --  3.7* 4.3  HGB 8.1* 8.5* 8.7* 8.2* 9.1* 9.5*  HCT 24.1* 25.3* 24.9* 24.0* 25.8* 27.1*  MCV 90.3 91.3 88.0  --  87.8 88.3  PLT 173 155 159  --  181 027   Basic Metabolic Panel: Recent Labs  Lab 07/10/22 0404 07/10/22 1449 07/11/22 0623 07/13/22 1323 07/13/22 1542  NA 129* 130* 133* 131*  --   K 5.1 3.1* 3.3* 3.9  --   CL 95* 94* 97*  --   --   CO2 21* 25  30  --   --   GLUCOSE 972* 284* 147*  --   --   BUN 58* 26* 35*  --   --   CREATININE 5.29* 2.80* 4.14*  --  5.85*  CALCIUM 9.0 8.2* 8.2*  --   --   PHOS  --   --  3.5  --   --    Liver Function Tests: Recent Labs  Lab 07/10/22 0615  AST 39  ALT 27  ALKPHOS 101  BILITOT 0.8  PROT 7.1  ALBUMIN 3.6   CBG: Recent Labs  Lab 07/14/22 1601 07/14/22 2132 07/15/22 0518 07/15/22 0546 07/15/22 0742  GLUCAP 119* 142* 47* 191* 155*    Discharge time spent: greater  than 30 minutes.  Signed: Fritzi Mandes, MD [facility] 07/15/2022

## 2022-07-15 NOTE — Progress Notes (Signed)
[facility]  ROUNDING NOTE   Subjective:   Jimmy Olson  is a 62 year old Hispanic male with past medical conditions including anemia, two-vessel CAD, diabetes, depression, DVT with Eliquis, and end-stage renal disease on hemodialysis.  Patient presents to the emergency department with complaints of shortness of breath and chest pain. Patient has been admitted for Flash pulmonary edema ([place]) [J81.0] Hyperglycemia [R73.9] Acute respiratory failure with hypoxia ([place]) [J96.01] Acute hypoxemic respiratory failure ([place]) [J96.01] Acute on chronic respiratory acidosis (HCC) [J96.02, J96.12] Acute hypoxic respiratory failure ([place]) [J96.01]  Patient is known to our practice and receives outpatient dialysis treatments at [facility] on a TTS schedule, supervised by Dr. Candiss Norse.    Update: Patient seen and evaluated during hemodialysis treatment today. Tolerating well.  Still having some diarrhea.   Objective:  Vital signs in last 24 hours:  Temp:  [97.7 F (36.5 C)-98.6 F (37 C)] 97.7 F (36.5 C) (01/27 0941) Pulse Rate:  [62-79] 65 (01/27 1020) Resp:  [14-20] 16 (01/27 1020) BP: (101-155)/(59-91) 130/81 (01/27 1020) SpO2:  [97 %-100 %] 98 % (01/27 1020) Weight:  [63.9 kg] 63.9 kg (01/27 0941)  Weight change:  Filed Weights   07/13/22 1616 07/13/22 1910 07/15/22 0941  Weight: 67.8 kg 65.8 kg 63.9 kg    Intake/Output: I/O last 3 completed shifts: In: 338 [P.O.:240; I.V.:98] Out: -    Intake/Output this shift:  No intake/output data recorded.  Physical Exam: General: NAD, resting quietly  Head: Normocephalic, atraumatic. Moist oral mucosal membranes  Eyes: Anicteric  Lungs:  Clear to auscultation, normal effort  Heart: Regular rate and rhythm  Abdomen:  Soft, nontender, nondistended  Extremities: No peripheral edema.  Neurologic: Alert and oriented, moving all four extremities  Skin: No lesions  Access: Right IJ PermCath    Basic Metabolic  Panel: Recent Labs  Lab 07/10/22 0404 07/10/22 1449 07/11/22 0623 07/13/22 1323 07/13/22 1542  NA 129* 130* 133* 131*  --   K 5.1 3.1* 3.3* 3.9  --   CL 95* 94* 97*  --   --   CO2 21* 25 30  --   --   GLUCOSE 972* 284* 147*  --   --   BUN 58* 26* 35*  --   --   CREATININE 5.29* 2.80* 4.14*  --  5.85*  CALCIUM 9.0 8.2* 8.2*  --   --   PHOS  --   --  3.5  --   --      Liver Function Tests: Recent Labs  Lab 07/10/22 0615  AST 39  ALT 27  ALKPHOS 101  BILITOT 0.8  PROT 7.1  ALBUMIN 3.6    No results for input(s): "LIPASE", "AMYLASE" in the last 168 hours. No results for input(s): "AMMONIA" in the last 168 hours.  CBC: Recent Labs  Lab 07/11/22 0128 07/12/22 0512 07/13/22 0546 07/13/22 1323 07/13/22 1542 07/14/22 0541  WBC 9.0 5.4 3.8*  --  3.7* 4.3  HGB 8.1* 8.5* 8.7* 8.2* 9.1* 9.5*  HCT 24.1* 25.3* 24.9* 24.0* 25.8* 27.1*  MCV 90.3 91.3 88.0  --  87.8 88.3  PLT 173 155 159  --  181 178     Cardiac Enzymes: No results for input(s): "CKTOTAL", "CKMB", "CKMBINDEX", "TROPONINI" in the last 168 hours.  BNP: Invalid input(s): "POCBNP"  CBG: Recent Labs  Lab 07/14/22 1601 07/14/22 2132 07/15/22 0518 07/15/22 0546 07/15/22 0742  GLUCAP 119* 142* 47* 191* 155*     Microbiology: Results for orders placed  or performed during the hospital encounter of 07/10/22  Resp panel by RT-PCR (RSV, Flu A&B, Covid) Anterior Nasal Swab     Status: None   Collection Time: 07/10/22  6:49 AM   Specimen: Anterior Nasal Swab  Result Value Ref Range Status   SARS Coronavirus 2 by RT PCR NEGATIVE NEGATIVE Final    Comment: (NOTE) SARS-CoV-2 target nucleic acids are NOT DETECTED.  The SARS-CoV-2 RNA is generally detectable in upper respiratory specimens during the acute phase of infection. The lowest concentration of SARS-CoV-2 viral copies this assay can detect is 138 copies/mL. A negative result does not preclude SARS-Cov-2 infection and should not be used as the  sole basis for treatment or other patient management decisions. A negative result may occur with  improper specimen collection/handling, submission of specimen other than nasopharyngeal swab, presence of viral mutation(s) within the areas targeted by this assay, and inadequate number of viral copies(<138 copies/mL). A negative result must be combined with clinical observations, patient history, and epidemiological information. The expected result is Negative.  Fact Sheet for Patients:  EntrepreneurPulse.com.au  Fact Sheet for Healthcare Providers:  IncredibleEmployment.be  This test is no t yet approved or cleared by the [place] FDA and  has been authorized for detection and/or diagnosis of SARS-CoV-2 by FDA under an Emergency Use Authorization (EUA). This EUA will remain  in effect (meaning this test can be used) for the duration of the COVID-19 declaration under Section 564(b)(1) of the Act, 21 U.S.C.section 360bbb-3(b)(1), unless the authorization is terminated  or revoked sooner.       Influenza A by PCR NEGATIVE NEGATIVE Final   Influenza B by PCR NEGATIVE NEGATIVE Final    Comment: (NOTE) The Xpert Xpress SARS-CoV-2/FLU/RSV plus assay is intended as an aid in the diagnosis of influenza from Nasopharyngeal swab specimens and should not be used as a sole basis for treatment. Nasal washings and aspirates are unacceptable for Xpert Xpress SARS-CoV-2/FLU/RSV testing.  Fact Sheet for Patients: EntrepreneurPulse.com.au  Fact Sheet for Healthcare Providers: IncredibleEmployment.be  This test is not yet approved or cleared by the [place] FDA and has been authorized for detection and/or diagnosis of SARS-CoV-2 by FDA under an Emergency Use Authorization (EUA). This EUA will remain in effect (meaning this test can be used) for the duration of the COVID-19 declaration under Section 564(b)(1) of the  Act, 21 U.S.C. section 360bbb-3(b)(1), unless the authorization is terminated or revoked.     Resp Syncytial Virus by PCR NEGATIVE NEGATIVE Final    Comment: (NOTE) Fact Sheet for Patients: EntrepreneurPulse.com.au  Fact Sheet for Healthcare Providers: IncredibleEmployment.be  This test is not yet approved or cleared by the [place] FDA and has been authorized for detection and/or diagnosis of SARS-CoV-2 by FDA under an Emergency Use Authorization (EUA). This EUA will remain in effect (meaning this test can be used) for the duration of the COVID-19 declaration under Section 564(b)(1) of the Act, 21 U.S.C. section 360bbb-3(b)(1), unless the authorization is terminated or revoked.  Performed at [facility], [address] [place], [place] [address]     Coagulation Studies: No results for input(s): "LABPROT", "INR" in the last 72 hours.   Urinalysis: No results for input(s): "COLORURINE", "LABSPEC", "PHURINE", "GLUCOSEU", "HGBUR", "BILIRUBINUR", "KETONESUR", "PROTEINUR", "UROBILINOGEN", "NITRITE", "LEUKOCYTESUR" in the last 72 hours.  Invalid input(s): "APPERANCEUR"     Imaging: CARDIAC CATHETERIZATION  Result Date: 07/13/2022   Ost LAD to Prox LAD lesion is 50% stenosed.  Prox LAD lesion is 40% stenosed.  Prox LAD to Mid LAD stent is widely patent with minimal-5% stenosed.   1st Diag lesion is 45% stenosed.   Prox RCA lesion is 20% stenosed.  Mid RCA lesion is 30% stenosed.  Dist RCA lesion is 40% stenosed.  RPDA lesion is 40% stenosed.   LV end diastolic pressure is severely elevated.   There is no aortic valve stenosis. Post-Cath Diagnoses Widely patent LAD stent with otherwise minimal CAD from last catheterization Severely elevated LVEDP of 28 mmHg indicating pressure overload (acute On Chronic Combined Systolic and Diastolic Heart Failure). Suspect this is the etiology for elevated troponin levels. Would not call this a  non-ST elevation myocardial infarction. Recommendations: Systemic blood pressure control and volume removal with dialysis. Glenetta Hew, MD     Medications:      aspirin EC  81 mg Oral Daily   carvedilol  25 mg Oral BID   Chlorhexidine Gluconate Cloth  6 each Topical Q0600   clopidogrel  75 mg Oral Daily   escitalopram  10 mg Oral QHS   ezetimibe  10 mg Oral Daily   feeding supplement (NEPRO CARB STEADY)  237 mL Oral TID BM   heparin  5,000 Units Subcutaneous Q8H   hydrALAZINE  100 mg Oral TID   insulin aspart  0-6 Units Subcutaneous TID WC   insulin detemir  22 Units Subcutaneous QHS   isosorbide mononitrate  60 mg Oral BID   losartan  100 mg Oral Daily   pantoprazole  20 mg Oral Daily   rosuvastatin  10 mg Oral Daily   torsemide  20 mg Oral Once per day on Sun Sat   acetaminophen, acetaminophen, albuterol, dextrose, diphenoxylate-atropine, guaiFENesin-dextromethorphan, loperamide, nitroGLYCERIN, ondansetron **OR** ondansetron (ZOFRAN) IV, mouth rinse  Assessment/ Plan:  Jimmy Olson is a 62 y.o.  male with past medical conditions including anemia, two-vessel CAD, diabetes, depression, DVT with Eliquis, and end-stage renal disease on hemodialysis.  Patient presents to the emergency department with complaints of shortness of breath and chest pain. Patient has been admitted for Flash pulmonary edema ([place]) [J81.0] Hyperglycemia [R73.9] Acute respiratory failure with hypoxia ([place]) [J96.01] Acute hypoxemic respiratory failure (HCC) [J96.01] Acute on chronic respiratory acidosis (HCC) [J96.02, J96.12] Acute hypoxic respiratory failure ([place]) [J96.01]  [facility]/TTS/right chest PermCath  End-stage renal disease on hemodialysis.  Patient seen and evaluated during hemodialysis treatment today.  Tolerating well.  We plan to complete dialysis treatment today.  Next treatment will be on Tuesday if still here.  2. Anemia of chronic kidney disease Lab Results   Component Value Date   HGB 9.5 (L) 07/14/2022    Patient receives [facility] outpatient.  Hemoglobin stable.  3. Secondary Hyperparathyroidism: with outpatient labs: PTH 387, phosphorus 5.1, calcium 9.1 on 06/29/22.   Lab Results  Component Value Date   CALCIUM 8.2 (L) 07/11/2022   CAION 1.19 07/13/2022   PHOS 3.5 07/11/2022    Phosphorus 3.5 at last check and within acceptable target.  Continue to monitor bone metabolism parameters.  4. Diabetes mellitus type II with chronic kidney disease/renal manifestations: insulin dependent. Home regimen includes NovoLog, Humulog, Levemir. Most recent hemoglobin A1c is 10.1 on 05/17/22   Management of sugars as per hospitalist.   LOS: 5 Mallerie Blok 1/27/202410:51 AM

## 2022-07-15 NOTE — Progress Notes (Signed)
Hypoglycemic Event  CBG: 47  Treatment: D50 50 mL (25 gm)  Symptoms: Sweaty and drowsy  Follow-up CBG: Time:0546 CBG Result:191  Possible Reasons for Event: Inadequate meal intake and Medication regimen:    Comments/MD notified:    Sallye Lat

## 2022-07-15 NOTE — Progress Notes (Signed)
Sharion Settler, NP notified of hypoglycemic episode.

## 2022-07-15 NOTE — Discharge Instructions (Signed)
Resume your outpatient hemodialysis schedule Tuesday Thursday Saturday as before

## 2022-07-15 NOTE — Progress Notes (Signed)
PT did 3.5 hrs of HD, tolerated well with no signs of distress. Unable to achieve UF goal due to bp dropping  UF = 1000 ml    07/15/22 1331  Vitals  Temp 98.4 F (36.9 C)  Temp Source Oral  BP (!) 159/101  MAP (mmHg) 117  BP Location Left Arm  BP Method Automatic  Patient Position (if appropriate) Lying  Pulse Rate 73  Pulse Rate Source Monitor  ECG Heart Rate 73  Resp (!) 26  Oxygen Therapy  SpO2 100 %  O2 Device Room Air  Post Treatment  Dialyzer Clearance Lightly streaked  Duration of HD Treatment -hour(s) 3.5 hour(s)  Hemodialysis Intake (mL) 0 mL  Liters Processed 84  Fluid Removed (mL) 1000 mL  Tolerated HD Treatment Yes  Post-Hemodialysis Comments Pt has no signs of distress  Hemodialysis Catheter Right Internal jugular Double lumen Permanent (Tunneled)  Placement Date/Time: 06/02/21 1429   Time Out: Correct patient;Correct site;Correct procedure  Maximum sterile barrier precautions: Hand hygiene;Cap;Mask;Sterile gown;Sterile gloves;Large sterile sheet  Site Prep: Chlorhexidine (preferred)  Local Anes...  Site Condition No complications  Blue Lumen Status Heparin locked  Red Lumen Status Heparin locked  Purple Lumen Status N/A  Catheter fill solution Heparin 1000 units/ml  Catheter fill volume (Arterial) 1.9 cc  Catheter fill volume (Venous) 1.9  Dressing Type Transparent  Dressing Status Antimicrobial disc in place  Interventions New dressing  Drainage Description None  Dressing Change Due 07/18/22  Post treatment catheter status Capped and Clamped

## 2022-08-07 NOTE — Progress Notes (Signed)
Cardiology Clinic Note   Patient Name: Jimmy Olson Date of Encounter: 08/11/2022  Primary Care Provider:  [facility] [place] Primary Cardiologist:  Jimmy Rogue, MD  Patient Profile  Spanish-speaking interpreter used throughout entire visit for correspondence with patient and wife during his visit today.  62 year old male with a history of coronary artery disease status post LAD stenting in December 2023, masked ischemic and nonischemic cardiomyopathy with an EF of 40-45% in November 2023, chronic HFrEF, hypertension, hyperlipidemia, type 2 diabetes, end-stage renal disease on hemodialysis, left upper extremity DVT previously on apixaban, and TIA, who is here today for follow-up of his coronary artery disease and HFrEF after recent hospitalization.  Past Medical History    Past Medical History:  Diagnosis Date   Anemia    Anxiety and depression    ATN (acute tubular necrosis) (HCC)    CAD (coronary artery disease)    a. 06/2020 Cath: Nonobs dzs; b. 05/2021 NSTEMI/Cath: Nonobs dzs->demand isch; c. 05/2022 Cath: LM mild dzs, LAD 40ost/p, 12m D1 70, RI mild dzs, LCX small, mild dzs, RCA 20p, 374m, RPDA 40; d. 05/2022 Staged PCI: Orbital atherectomy of mLAD  (2.75x48 Synergy XD DES).   Chronic HFrEF (heart failure with reduced ejection fraction) ([facility]   a. 08/2019 Echo: EF 45-50%; b. 06/2020 Echo: EF 30-35%; c. 04/2022 Echo: EF 40-45%, mild LVH, small apical region of noncompaction, GrII DD, nl RV fxn.   DVT of axillary vein, acute left ([facility]   a.) Tx'd with apixaban   [facility]   a.) M-W-F   History of 2019 novel coronavirus disease (COVID-19) 03/06/2020   a.) s/p Tx with monoclonal Ab infusion   HLD (hyperlipidemia)    Hyperkalemia 12/23/2020   Hypertension    Left carotid artery stenosis 08/18/2021   a.) Carotid Doppler [id number] stenosis   Long term current use of anticoagulant    a.)  apixaban for DVT   Mixed Ischemic & Nonischemic Cardiomyopathy    a. 08/2019 Echo: EF 45-50%; b. 06/2020 Echo: EF 30-35%; c. 04/2022 Echo: EF 40-45%, mild LVH, small apical region of noncompaction, GrII DD, nl RV fxn.   [place]I (non-ST elevated myocardial infarction) [place]   Sepsis ([facility]   Suicidal ideations    TIA (transient ischemic attack) 06/2021   [facility]   Past Surgical History:  Procedure Laterality Date   APPENDECTOMY     AV FISTULA PLACEMENT Left 09/29/2021   Procedure: ARTERIOVENOUS (AV) FISTULA CREATION ( RADIAL CEPHALIC);  Surgeon: Jimmy HuxleyMD;  Location: [facility] ORS;  Service: Vascular;  Laterality: Left;   CARDIAC CATHETERIZATION     CORONARY ATHERECTOMY N/A 06/07/2022   Procedure: CORONARY ATHERECTOMY;  Surgeon: ArWellington HampshireMD;  [place];  Service: Cardiovascular;  Laterality: N/A;   CORONARY STENT INTERVENTION N/A 06/07/2022   Procedure: CORONARY STENT INTERVENTION;  Surgeon: ArWellington HampshireMD;  [place] [facility];  Service: Cardiovascular;  Laterality: N/A;   DIALYSIS/PERMA CATHETER INSERTION N/A 06/02/2021   Procedure: DIALYSIS/PERMA CATHETER INSERTION;  Surgeon: Jimmy HuxleyMD;  [facility];  Service: Cardiovascular;  Laterality: N/A;   INTRAVASCULAR IMAGING/OCT N/A 06/07/2022   Procedure: INTRAVASCULAR IMAGING/OCT;  Surgeon: ArWellington HampshireMD;  [place] [facility];  Service: Cardiovascular;  Laterality: N/A;   LEFT HEART CATH AND CORONARY ANGIOGRAPHY N/A 06/03/2021  Procedure: LEFT HEART CATH AND CORONARY ANGIOGRAPHY;  Surgeon: Jimmy Hampshire, MD;  Location: [place] CV LAB;  Service: Cardiovascular;  Laterality: N/A;   LEFT HEART CATH AND CORONARY ANGIOGRAPHY N/A 07/13/2022   Procedure: LEFT HEART CATH AND CORONARY ANGIOGRAPHY;  Surgeon: Jimmy Man, MD;  Location: [place] CV LAB;  Service: Cardiovascular;  Laterality: N/A;    LIGATION OF ARTERIOVENOUS  FISTULA Left 10/02/2021   Procedure: LIGATION OF ARTERIOVENOUS  FISTULA;  Surgeon: Jimmy Bury, MD;  Location: [facility] ORS;  Service: Vascular;  Laterality: Left;   NASAL SINUS SURGERY     RIGHT/LEFT HEART CATH AND CORONARY ANGIOGRAPHY N/A 07/19/2020   Procedure: RIGHT/LEFT HEART CATH AND CORONARY ANGIOGRAPHY;  Surgeon: Jimmy Hampshire, MD;  Location: [place] CV LAB;  Service: Cardiovascular;  Laterality: N/A;   RIGHT/LEFT HEART CATH AND CORONARY ANGIOGRAPHY N/A 05/26/2022   Procedure: RIGHT/LEFT HEART CATH AND CORONARY ANGIOGRAPHY;  Surgeon: Jimmy Hampshire, MD;  Location: [place] CV LAB;  Service: Cardiovascular;  Laterality: N/A;    Allergies  No Known Allergies  History of Present Illness    Jimmy Olson is a 62 year old male with previously mentioned complex past medical history of insulin-dependent type 2 diabetes, chronic systolic congestive heart failure with an EF of 45%, end-stage renal disease initiated on hemodialysis in December 2022, anemia of chronic disease, two-vessel coronary artery disease on left heart catheterization in December 2022 with coronary atherectomy and DES placement to the mid LAD, history of DVT in the left axillary previously on apixaban, recent admission for TIA with MRI showing ulcerated plaque in the left proximal left ICA.  He previously had negative stress testing in 2011.  In March 2021 he was evaluated for chest pain and elevated high-sensitivity troponin.  Echocardiogram showed EF 45-50% with global hypokinesis and moderate LVH.  He was hypertensive at the time.  Arrangements for follow-up and stress testing were made but he did not follow-up.  In January 2022 he was admitted with recurrent heart failure complicated by stage IV kidney disease.  Echocardiogram was repeated at that time revealed LVEF of 30-35%.  Diagnostic catheterization showed moderate, nonobstructive CAD, mildly elevated filling pressures.  He  was readmitted in December 2022 secondary to dyspnea and chest pressure, ruling in for NSTEMI with troponin of 4844.  Repeat diagnostic catheterization showed relatively stable anatomy and presentation was felt to be secondary to demand ischemia in the setting of hypertensive urgency.  He had mated in December 2023 in the setting of hypertensive emergency and pulmonary edema despite compliance with outpatient dialysis.  He again had troponin elevation around 400 range.  He underwent diagnostic catheterization which showed progression of the mid LAD disease with 80% stenosis.  It was felt he would benefit most from staged intervention with orbital atherectomy and this was subsequently performed at [facility] with DES placement in 06/07/22.  He was diagnosed at the time of PCI he had moderate ostial LAD stenosis which if it were to progress he would not be optimal for PCI.  Recently readmitted to [facility] 07/10/2022 with chest pain and shortness of breath.  He previously been seen in the emergency department treated for bronchitis and was given steroids, x-ray showed mild pulmonary edema, viral testing was negative.  Initial vitals reveal blood pressure 163/141, pulse of 123, respirations of 30, oxygen saturation 92%.  Sodium 129, chloride 95, blood glucose of 72, serum creatinine 5.9, BUN 58, hemoglobin 10.2, BNP> 45,00, high-sensitivity troponin 610.  On 07/13/2022  he underwent repeat left heart catheterization which revealed widely patent LAD stent with otherwise minimal CAD from last catheterization, severely elevated LVEDP of 20 mmHg indicating pressure overload, suspect this is etiology for elevated troponin levels.  Would not call this non-ST elevated myocardial infarction recommendations with systemic blood pressure control and volume removal with dialysis.  He was diuresed and had better blood pressure control and was subsequently able to be discharged home on 07/15/22.  He returns to clinic today accompanied by  his wife.  He complains of sticking pains under his left arm left side upper left chest and midsternal today.  He denies any associated symptoms of worsening shortness of breath, blurry vision, lightheadedness, or peripheral edema.  Of his last hospitalization he had a heart catheterization where he would need better blood pressure control and volume removal with dialysis.  His blood pressure has been elevated today as he has not had any of his medications prior to his appointment because he has not eaten any food yet today.  He does have concerns at the evening stating that he wakes his self snoring and wakes up short of breath.  He is scared to be at home most nights because his wife continues to work night shift and he is there alone.  Home Medications    Current Outpatient Medications  Medication Sig Dispense Refill   acetaminophen (TYLENOL) 325 MG tablet Take 650 mg by mouth every 6 (six) hours as needed.     albuterol (PROVENTIL HFA) 108 (90 Base) MCG/ACT inhaler Inhale 2 puffs into the lungs every 4 (four) hours as needed for wheezing or shortness of breath. 1 each 0   aspirin EC 81 MG tablet Take 1 tablet (81 mg total) by mouth daily. Swallow whole. 90 tablet 3   insulin detemir (LEVEMIR FLEXPEN) 100 UNIT/ML FlexPen Inject 15 Units into the skin at bedtime. 15 mL 5   nitroGLYCERIN (NITROSTAT) 0.4 MG SL tablet Place 1 tablet (0.4 mg total) under the tongue every 5 (five) minutes as needed for chest pain. 25 tablet 0   NOVOLOG FLEXPEN 100 UNIT/ML FlexPen Inject 0-15 Units into the skin 3 (three) times daily with meals. 0-15 units tid 0-5 units qhs     Nutritional Supplements (FEEDING SUPPLEMENT, NEPRO CARB STEADY,) LIQD Take 237 mLs by mouth 3 (three) times daily between meals.  0   torsemide (DEMADEX) 20 MG tablet Take 1 tablet (20 mg total) by mouth daily. Takes on Sat and Sun 30 tablet 0   carvedilol (COREG) 25 MG tablet Take 1 tablet (25 mg total) by mouth 2 (two) times daily. 30 tablet 0    clopidogrel (PLAVIX) 75 MG tablet Take 1 tablet (75 mg total) by mouth daily. 30 tablet 0   escitalopram (LEXAPRO) 10 MG tablet Take 1 tablet (10 mg total) by mouth at bedtime. 30 tablet 0   ezetimibe (ZETIA) 10 MG tablet Take 1 tablet (10 mg total) by mouth daily. 30 tablet 1   hydrALAZINE (APRESOLINE) 100 MG tablet Take 1 tablet (100 mg total) by mouth 2 (two) times daily. 60 tablet 0   isosorbide dinitrate (ISORDIL) 30 MG tablet TAKE 1 TABLET BY MOUTH ONCE DAILY. (Patient not taking: Reported on 08/11/2022) 90 tablet 1   isosorbide mononitrate (IMDUR) 30 MG 24 hr tablet Take 1 tablet (30 mg total) by mouth in the morning and at bedtime. 30 tablet 3   losartan (COZAAR) 100 MG tablet Take 1 tablet (100 mg total) by mouth daily. 30 tablet  0   pantoprazole (PROTONIX) 20 MG tablet Take 1 tablet (20 mg total) by mouth daily. 30 tablet 0   rosuvastatin (CRESTOR) 10 MG tablet Take 1 tablet (10 mg total) by mouth daily. 30 tablet 1   No current facility-administered medications for this visit.     Family History    Family History  Problem Relation Age of Onset   Kidney failure Mother        died @ 75   Heart failure Mother    Other Father        he never knew his father   Diabetes Brother    He indicated that his mother is deceased. He indicated that his father is deceased. He indicated that his brother is alive.  Social History    Social History   Socioeconomic History   Marital status: Married    Spouse name: Ana   Number of children: 7   Years of education: Not on file   Highest education level: Not on file  Occupational History   Not on file  Tobacco Use   Smoking status: Never   Smokeless tobacco: Never  Vaping Use   Vaping Use: Never used  Substance and Sexual Activity   Alcohol use: Not Currently   Drug use: Never   Sexual activity: Not on file  Other Topics Concern   Not on file  Social History Narrative   Lives locally with wife and son.  He is currently unemployed  - has worked in different industries.     Social Determinants of Health   Financial Resource Strain: Not on file  Food Insecurity: No Food Insecurity (07/13/2022)   Hunger Vital Sign    Worried About Running Out of Food in the Last Year: Never true    Ran Out of Food in the Last Year: Never true  Transportation Needs: No Transportation Needs (07/13/2022)   PRAPARE - Hydrologist (Medical): No    Lack of Transportation (Non-Medical): No  Physical Activity: Not on file  Stress: Not on file  Social Connections: Not on file  Intimate Partner Violence: Not At Risk (07/13/2022)   Humiliation, Afraid, Rape, and Kick questionnaire    Fear of Current or Ex-Partner: No    Emotionally Abused: No    Physically Abused: No    Sexually Abused: No     Review of Systems    General:  No chills, fever, night sweats or weight changes.  Endorses fatigue Cardiovascular: Endorses chest pain, dyspnea on exertion, edema, denies orthopnea, palpitations, endorses paroxysmal nocturnal dyspnea. Dermatological: No rash, lesions/masses Respiratory: No cough, endorses dyspnea, endorses shortness of breath endorses snoring Urologic: No hematuria, dysuria Abdominal:   No nausea, vomiting, diarrhea, bright red blood per rectum, melena, or hematemesis Neurologic:  No visual changes, wkns, changes in mental status. All other systems reviewed and are otherwise negative except as noted above.    Cardiac Rehabilitation Eligibility Assessment  The patient is ready to start cardiac rehabilitation from a cardiac standpoint.    Physical Exam    VS:  BP (!) 180/88 (BP Location: Left Arm, Patient Position: Sitting, Cuff Size: Normal)   Pulse 75   Ht '5\' 6"'$  (1.676 m)   Wt 151 lb 12.8 oz (68.9 kg)   SpO2 98%   BMI 24.50 kg/m  , BMI Body mass index is 24.5 kg/m.     Vitals:   08/11/22 0905 08/11/22 0915  BP: (!) 190/94 (!) 180/88   GEN:  Well nourished, well developed, in no acute  distress. HEENT: normal. Neck: Supple, no JVD, carotid bruits, or masses. Cardiac: RRR, no murmurs, rubs, or gallops. No clubbing, cyanosis, edema.  Radials 2+/PT 2+ and equal bilaterally.  Respiratory:  Respirations regular and unlabored, clear to auscultation bilaterally. GI: Soft, nontender, nondistended, BS + x 4. MS: no deformity or atrophy. Skin: warm and dry, no rash.  Dialysis port to the right upper chest clamped and capped off. Neuro:  Strength and sensation are intact. Psych: Normal affect.  Accessory Clinical Findings    ECG personally reviewed by me today-sinus rhythm with a rate of 85, LVH, ST depression and T wave inversions in the inferior lateral leads- No acute changes  Lab Results  Component Value Date   WBC 4.3 07/14/2022   HGB 9.5 (L) 07/14/2022   HCT 27.1 (L) 07/14/2022   MCV 88.3 07/14/2022   PLT 178 07/14/2022   Lab Results  Component Value Date   CREATININE 5.85 (H) 07/13/2022   BUN 35 (H) 07/11/2022   NA 131 (L) 07/13/2022   K 3.9 07/13/2022   CL 97 (L) 07/11/2022   CO2 30 07/11/2022   Lab Results  Component Value Date   ALT 27 07/10/2022   AST 39 07/10/2022   ALKPHOS 101 07/10/2022   BILITOT 0.8 07/10/2022   Lab Results  Component Value Date   CHOL 113 10/02/2021   HDL 34 (L) 10/02/2021   LDLCALC 45 10/02/2021   TRIG 170 (H) 10/02/2021   CHOLHDL 3.3 10/02/2021    Lab Results  Component Value Date   HGBA1C 10.1 (H) 05/17/2022    Assessment & Plan   1.  Coronary artery disease with stable angina.  He recently underwent left heart catheterization during his last hospitalization on 07/13/2022 which revealed ostial LAD to proximal LAD lesions 50% stenosis, proximal LAD lesions 40% stenosed, proximal LAD to mid LAD stent is widely patent with minimal less than 5% stenosis, first diagonal lesion and 45% stenosed, proximal RCA lesion is 20% stenosed, mid RCA lesion is 30% stenosed, distal RCA lesion is 40% stenosis, RPDA lesion is 40% stenosed,  LV in diastolic pressure severely elevated, there is no aortic valve stenosis noted, recommendations are systemic blood pressure control and volume removal with dialysis.  He continues to have stabbing little prickly chest discomfort likely related today to elevated blood pressures of [id number] and [id number] systolic.  He has been continued on aspirin, clopidogrel, started on ezetimibe during his hospitalization in conjunction with rosuvastatin, Imdur, and Nitrostat 0.4 mg sublingual as needed.  He is also requesting refills of his clopidogrel, ezetimibe, Imdur, and rosuvastatin to be sent to the pharmacy in the hospital at [facility].  2.  Chronic combined systolic and diastolic heart failure (ischemic and nonischemic cardiomyopathy) with shortness of breath that has slowly improving.  With last echocardiogram completed 07/11/2022 with an LVEF of 35-40%, global hypokinesis, G1 DD, mild mitral regurgitation.  He is continued on carvedilol 25 mg twice daily, losartan 100 mg daily, torsemide 20 mg on Saturday and Sunday.  Further fluid removal is done with hemodialysis.  3.  Hypertension with blood pressure today of 190/94 with repeat blood pressure of 180/88.  He stated he has not had any of his medications yet today as he has not been able to eat anything yet this morning.  He has been continued on carvedilol 25 mg daily, hydralazine 100 mg twice daily, Imdur 30 mg daily, losartan 100 mg daily, and torsemide 20 mg on  Saturday and May.  He is requesting refills of his carvedilol Imdur and losartan today.  Has been advised that goal for blood pressure is less then 150/90.  He denies any dietary indiscretions and not eat a diet high in sodium.  4.  Shortness of breath with PND and snoring through the night.  Patient is unable to sleep due to waking up short of breath even after he has completed with dialysis.  He does suffer from fatigue during the day and nodding off and during the day.  Sending a referral for him to pulmonary  for altered sleep and to be worked up for sleep apnea.  5.  End-stage renal disease continued on hemodialysis.  Fluid removal removal is typically managed by dialysis.  Right upper chest permacath remains in place.  Continues to be managed by nephrology  6.  Mixed hyperlipidemia continued on rosuvastatin 10 mg daily and the addition of ezetimibe 10 mg daily.  Will need follow-up lipid panel and hepatic panel in 8 to 10 weeks.  7.  Disposition patient return to clinic to see MD/APP in 6 weeks or sooner if needed to reevaluate symptoms and to ensure consult to pulmonary has been arranged.   Yashira Offenberger, NP 08/11/2022, 1:15 PM

## 2022-08-11 ENCOUNTER — Encounter: Payer: Self-pay | Admitting: Cardiology

## 2022-08-11 ENCOUNTER — Other Ambulatory Visit: Payer: Self-pay

## 2022-08-11 ENCOUNTER — Ambulatory Visit: Payer: Medicare Other | Attending: Cardiology | Admitting: Cardiology

## 2022-08-11 VITALS — BP 180/88 | HR 75 | Ht 66.0 in | Wt 151.8 lb

## 2022-08-11 DIAGNOSIS — I5022 Chronic systolic (congestive) heart failure: Secondary | ICD-10-CM | POA: Diagnosis present

## 2022-08-11 DIAGNOSIS — I10 Essential (primary) hypertension: Secondary | ICD-10-CM | POA: Diagnosis present

## 2022-08-11 DIAGNOSIS — I5042 Chronic combined systolic (congestive) and diastolic (congestive) heart failure: Secondary | ICD-10-CM | POA: Insufficient documentation

## 2022-08-11 DIAGNOSIS — I25118 Atherosclerotic heart disease of native coronary artery with other forms of angina pectoris: Secondary | ICD-10-CM | POA: Diagnosis present

## 2022-08-11 DIAGNOSIS — N186 End stage renal disease: Secondary | ICD-10-CM | POA: Insufficient documentation

## 2022-08-11 DIAGNOSIS — E782 Mixed hyperlipidemia: Secondary | ICD-10-CM | POA: Diagnosis present

## 2022-08-11 DIAGNOSIS — R0602 Shortness of breath: Secondary | ICD-10-CM | POA: Diagnosis present

## 2022-08-11 MED ORDER — CARVEDILOL 25 MG PO TABS
25.0000 mg | ORAL_TABLET | Freq: Two times a day (BID) | ORAL | 0 refills | Status: DC
  Filled 2022-08-11: qty 30, 15d supply, fill #0

## 2022-08-11 MED ORDER — ROSUVASTATIN CALCIUM 10 MG PO TABS
10.0000 mg | ORAL_TABLET | Freq: Every day | ORAL | 1 refills | Status: DC
  Filled 2022-08-11: qty 30, 30d supply, fill #0

## 2022-08-11 MED ORDER — EZETIMIBE 10 MG PO TABS
10.0000 mg | ORAL_TABLET | Freq: Every day | ORAL | 1 refills | Status: DC
  Filled 2022-08-11: qty 30, 30d supply, fill #0

## 2022-08-11 MED ORDER — PANTOPRAZOLE SODIUM 20 MG PO TBEC
20.0000 mg | DELAYED_RELEASE_TABLET | Freq: Every day | ORAL | 0 refills | Status: DC
  Filled 2022-08-11: qty 30, 30d supply, fill #0

## 2022-08-11 MED ORDER — CLOPIDOGREL BISULFATE 75 MG PO TABS
75.0000 mg | ORAL_TABLET | Freq: Every day | ORAL | 0 refills | Status: DC
  Filled 2022-08-11: qty 30, 30d supply, fill #0

## 2022-08-11 MED ORDER — ISOSORBIDE MONONITRATE ER 30 MG PO TB24
30.0000 mg | ORAL_TABLET | Freq: Two times a day (BID) | ORAL | 3 refills | Status: DC
  Filled 2022-08-11: qty 30, 15d supply, fill #0

## 2022-08-11 MED ORDER — LOSARTAN POTASSIUM 100 MG PO TABS
100.0000 mg | ORAL_TABLET | Freq: Every day | ORAL | 0 refills | Status: DC
  Filled 2022-08-11: qty 30, 30d supply, fill #0

## 2022-08-11 NOTE — Patient Instructions (Signed)
Medication Instructions:   Your physician recommends that you continue on your current medications as directed. Please refer to the Current Medication list given to you today.   *If you need a refill on your cardiac medications before your next appointment, please call your pharmacy*   Lab Work:  None Ordered  If you have labs (blood work) drawn today and your tests are completely normal, you will receive your results only by: [employer] (if you have MyChart) OR A paper copy in the mail If you have any lab test that is abnormal or we need to change your treatment, we will call you to review the results.   Testing/Procedures:  None Ordered   Follow-Up: At [facility], you and your health needs are our priority.  As part of our continuing mission to provide you with exceptional heart care, we have created designated Provider Care Teams.  These Care Teams include your primary Cardiologist (physician) and Advanced Practice Providers (APPs -  Physician Assistants and Nurse Practitioners) who all work together to provide you with the care you need, when you need it.  We recommend signing up for the patient portal called "MyChart".  Sign up information is provided on this After Visit Summary.  MyChart is used to connect with patients for Virtual Visits (Telemedicine).  Patients are able to view lab/test results, encounter notes, upcoming appointments, etc.  Non-urgent messages can be sent to your provider as well.   To learn more about what you can do with MyChart, go to [email or web address].    Your next appointment:   6 week(s)  Provider:   You may see Ida Rogue, MD or one of the following Advanced Practice Providers on your designated Care Team:   Murray Hodgkins, NP Christell Faith, PA-C Cadence Kathlen Mody, PA-C Gerrie Nordmann, NP

## 2022-08-14 ENCOUNTER — Encounter: Payer: Self-pay | Admitting: Emergency Medicine

## 2022-08-14 ENCOUNTER — Inpatient Hospital Stay
Admission: EM | Admit: 2022-08-14 | Discharge: 2022-08-16 | DRG: 291 | Disposition: A | Discharge status: Home / Self Care | Payer: Medicare Other | Attending: Hospitalist | Admitting: Hospitalist

## 2022-08-14 ENCOUNTER — Emergency Department: Payer: Medicare Other

## 2022-08-14 DIAGNOSIS — E1165 Type 2 diabetes mellitus with hyperglycemia: Secondary | ICD-10-CM

## 2022-08-14 DIAGNOSIS — Z8616 Personal history of COVID-19: Secondary | ICD-10-CM | POA: Diagnosis not present

## 2022-08-14 DIAGNOSIS — E785 Hyperlipidemia, unspecified: Secondary | ICD-10-CM | POA: Diagnosis present

## 2022-08-14 DIAGNOSIS — I25118 Atherosclerotic heart disease of native coronary artery with other forms of angina pectoris: Secondary | ICD-10-CM | POA: Diagnosis present

## 2022-08-14 DIAGNOSIS — Z91128 Patient's intentional underdosing of medication regimen for other reason: Secondary | ICD-10-CM

## 2022-08-14 DIAGNOSIS — R0602 Shortness of breath: Secondary | ICD-10-CM | POA: Diagnosis present

## 2022-08-14 DIAGNOSIS — I509 Heart failure, unspecified: Secondary | ICD-10-CM | POA: Diagnosis not present

## 2022-08-14 DIAGNOSIS — I5020 Unspecified systolic (congestive) heart failure: Secondary | ICD-10-CM | POA: Diagnosis present

## 2022-08-14 DIAGNOSIS — E1122 Type 2 diabetes mellitus with diabetic chronic kidney disease: Secondary | ICD-10-CM | POA: Diagnosis present

## 2022-08-14 DIAGNOSIS — M79642 Pain in left hand: Secondary | ICD-10-CM | POA: Diagnosis present

## 2022-08-14 DIAGNOSIS — Z833 Family history of diabetes mellitus: Secondary | ICD-10-CM

## 2022-08-14 DIAGNOSIS — I132 Hypertensive heart and chronic kidney disease with heart failure and with stage 5 chronic kidney disease, or end stage renal disease: Secondary | ICD-10-CM | POA: Diagnosis present

## 2022-08-14 DIAGNOSIS — F32A Depression, unspecified: Secondary | ICD-10-CM | POA: Diagnosis present

## 2022-08-14 DIAGNOSIS — Z841 Family history of disorders of kidney and ureter: Secondary | ICD-10-CM

## 2022-08-14 DIAGNOSIS — Z86718 Personal history of other venous thrombosis and embolism: Secondary | ICD-10-CM

## 2022-08-14 DIAGNOSIS — Z8249 Family history of ischemic heart disease and other diseases of the circulatory system: Secondary | ICD-10-CM

## 2022-08-14 DIAGNOSIS — Z7982 Long term (current) use of aspirin: Secondary | ICD-10-CM

## 2022-08-14 DIAGNOSIS — Z794 Long term (current) use of insulin: Secondary | ICD-10-CM

## 2022-08-14 DIAGNOSIS — I255 Ischemic cardiomyopathy: Secondary | ICD-10-CM | POA: Diagnosis present

## 2022-08-14 DIAGNOSIS — R0609 Other forms of dyspnea: Principal | ICD-10-CM

## 2022-08-14 DIAGNOSIS — D631 Anemia in chronic kidney disease: Secondary | ICD-10-CM | POA: Diagnosis present

## 2022-08-14 DIAGNOSIS — N186 End stage renal disease: Secondary | ICD-10-CM

## 2022-08-14 DIAGNOSIS — I160 Hypertensive urgency: Secondary | ICD-10-CM | POA: Diagnosis present

## 2022-08-14 DIAGNOSIS — E1169 Type 2 diabetes mellitus with other specified complication: Secondary | ICD-10-CM | POA: Diagnosis present

## 2022-08-14 DIAGNOSIS — R739 Hyperglycemia, unspecified: Secondary | ICD-10-CM

## 2022-08-14 DIAGNOSIS — Z9889 Other specified postprocedural states: Secondary | ICD-10-CM

## 2022-08-14 DIAGNOSIS — N189 Chronic kidney disease, unspecified: Secondary | ICD-10-CM

## 2022-08-14 DIAGNOSIS — F419 Anxiety disorder, unspecified: Secondary | ICD-10-CM | POA: Diagnosis present

## 2022-08-14 DIAGNOSIS — R079 Chest pain, unspecified: Secondary | ICD-10-CM | POA: Diagnosis present

## 2022-08-14 DIAGNOSIS — R7989 Other specified abnormal findings of blood chemistry: Secondary | ICD-10-CM | POA: Insufficient documentation

## 2022-08-14 DIAGNOSIS — I428 Other cardiomyopathies: Secondary | ICD-10-CM | POA: Diagnosis present

## 2022-08-14 DIAGNOSIS — I5023 Acute on chronic systolic (congestive) heart failure: Secondary | ICD-10-CM | POA: Diagnosis present

## 2022-08-14 DIAGNOSIS — J918 Pleural effusion in other conditions classified elsewhere: Secondary | ICD-10-CM | POA: Diagnosis present

## 2022-08-14 DIAGNOSIS — Z7902 Long term (current) use of antithrombotics/antiplatelets: Secondary | ICD-10-CM

## 2022-08-14 DIAGNOSIS — Z955 Presence of coronary angioplasty implant and graft: Secondary | ICD-10-CM

## 2022-08-14 DIAGNOSIS — Z79899 Other long term (current) drug therapy: Secondary | ICD-10-CM

## 2022-08-14 DIAGNOSIS — T383X6A Underdosing of insulin and oral hypoglycemic [antidiabetic] drugs, initial encounter: Secondary | ICD-10-CM | POA: Diagnosis present

## 2022-08-14 DIAGNOSIS — E8770 Fluid overload, unspecified: Secondary | ICD-10-CM | POA: Diagnosis present

## 2022-08-14 DIAGNOSIS — I161 Hypertensive emergency: Secondary | ICD-10-CM | POA: Diagnosis present

## 2022-08-14 DIAGNOSIS — I2489 Other forms of acute ischemic heart disease: Secondary | ICD-10-CM | POA: Diagnosis present

## 2022-08-14 DIAGNOSIS — Z992 Dependence on renal dialysis: Secondary | ICD-10-CM

## 2022-08-14 DIAGNOSIS — J810 Acute pulmonary edema: Secondary | ICD-10-CM

## 2022-08-14 DIAGNOSIS — J90 Pleural effusion, not elsewhere classified: Secondary | ICD-10-CM

## 2022-08-14 DIAGNOSIS — N2581 Secondary hyperparathyroidism of renal origin: Secondary | ICD-10-CM | POA: Diagnosis present

## 2022-08-14 DIAGNOSIS — Z7901 Long term (current) use of anticoagulants: Secondary | ICD-10-CM | POA: Diagnosis not present

## 2022-08-14 DIAGNOSIS — J9601 Acute respiratory failure with hypoxia: Secondary | ICD-10-CM | POA: Diagnosis present

## 2022-08-14 DIAGNOSIS — I252 Old myocardial infarction: Secondary | ICD-10-CM | POA: Diagnosis not present

## 2022-08-14 DIAGNOSIS — R0601 Orthopnea: Secondary | ICD-10-CM

## 2022-08-14 DIAGNOSIS — I6522 Occlusion and stenosis of left carotid artery: Secondary | ICD-10-CM | POA: Diagnosis present

## 2022-08-14 DIAGNOSIS — I2510 Atherosclerotic heart disease of native coronary artery without angina pectoris: Secondary | ICD-10-CM

## 2022-08-14 DIAGNOSIS — Z862 Personal history of diseases of the blood and blood-forming organs and certain disorders involving the immune mechanism: Secondary | ICD-10-CM

## 2022-08-14 DIAGNOSIS — Z8673 Personal history of transient ischemic attack (TIA), and cerebral infarction without residual deficits: Secondary | ICD-10-CM

## 2022-08-14 DIAGNOSIS — I5022 Chronic systolic (congestive) heart failure: Secondary | ICD-10-CM

## 2022-08-14 LAB — CBC WITH DIFFERENTIAL/PLATELET
Abs Immature Granulocytes: 0.01 10*3/uL (ref 0.00–0.07)
Basophils Absolute: 0 10*3/uL (ref 0.0–0.1)
Basophils Relative: 1 %
Eosinophils Absolute: 0.2 10*3/uL (ref 0.0–0.5)
Eosinophils Relative: 4 %
HCT: 26 % — ABNORMAL LOW (ref 39.0–52.0)
Hemoglobin: 8.9 g/dL — ABNORMAL LOW (ref 13.0–17.0)
Immature Granulocytes: 0 %
Lymphocytes Relative: 16 %
Lymphs Abs: 0.7 10*3/uL (ref 0.7–4.0)
MCH: 30.4 pg (ref 26.0–34.0)
MCHC: 34.2 g/dL (ref 30.0–36.0)
MCV: 88.7 fL (ref 80.0–100.0)
Monocytes Absolute: 0.4 10*3/uL (ref 0.1–1.0)
Monocytes Relative: 9 %
Neutro Abs: 2.9 10*3/uL (ref 1.7–7.7)
Neutrophils Relative %: 70 %
Platelets: 123 10*3/uL — ABNORMAL LOW (ref 150–400)
RBC: 2.93 MIL/uL — ABNORMAL LOW (ref 4.22–5.81)
RDW: 13.5 % (ref 11.5–15.5)
WBC: 4.1 10*3/uL (ref 4.0–10.5)
nRBC: 0 % (ref 0.0–0.2)

## 2022-08-14 LAB — CBC
HCT: 27.2 % — ABNORMAL LOW (ref 39.0–52.0)
Hemoglobin: 9.4 g/dL — ABNORMAL LOW (ref 13.0–17.0)
MCH: 30.5 pg (ref 26.0–34.0)
MCHC: 34.6 g/dL (ref 30.0–36.0)
MCV: 88.3 fL (ref 80.0–100.0)
Platelets: 129 10*3/uL — ABNORMAL LOW (ref 150–400)
RBC: 3.08 MIL/uL — ABNORMAL LOW (ref 4.22–5.81)
RDW: 13.2 % (ref 11.5–15.5)
WBC: 4.3 10*3/uL (ref 4.0–10.5)
nRBC: 0 % (ref 0.0–0.2)

## 2022-08-14 LAB — CREATININE, SERUM
Creatinine, Ser: 4.94 mg/dL — ABNORMAL HIGH (ref 0.61–1.24)
GFR, Estimated: 13 mL/min — ABNORMAL LOW (ref >60)

## 2022-08-14 LAB — BASIC METABOLIC PANEL
Anion gap: 10 (ref 5–15)
BUN: 36 mg/dL — ABNORMAL HIGH (ref 6–20)
CO2: 23 mmol/L (ref 22–32)
Calcium: 8.6 mg/dL — ABNORMAL LOW (ref 8.9–10.3)
Chloride: 99 mmol/L (ref 98–111)
Creatinine, Ser: 4.78 mg/dL — ABNORMAL HIGH (ref 0.61–1.24)
GFR, Estimated: 13 mL/min — ABNORMAL LOW (ref >60)
Glucose, Bld: 590 mg/dL (ref 70–99)
Potassium: 3.8 mmol/L (ref 3.5–5.1)
Sodium: 132 mmol/L — ABNORMAL LOW (ref 135–145)

## 2022-08-14 LAB — CBG MONITORING, ED: Glucose-Capillary: 281 mg/dL — ABNORMAL HIGH (ref 70–99)

## 2022-08-14 LAB — HEPATIC FUNCTION PANEL
ALT: 74 U/L — ABNORMAL HIGH (ref 0–44)
AST: 47 U/L — ABNORMAL HIGH (ref 15–41)
Albumin: 3.4 g/dL — ABNORMAL LOW (ref 3.5–5.0)
Alkaline Phosphatase: 89 U/L (ref 38–126)
Bilirubin, Direct: 0.2 mg/dL (ref 0.0–0.2)
Indirect Bilirubin: 1 mg/dL — ABNORMAL HIGH (ref 0.3–0.9)
Total Bilirubin: 1.2 mg/dL (ref 0.3–1.2)
Total Protein: 6 g/dL — ABNORMAL LOW (ref 6.5–8.1)

## 2022-08-14 LAB — RESP PANEL BY RT-PCR (RSV, FLU A&B, COVID)  RVPGX2
Influenza A by PCR: NEGATIVE
Influenza B by PCR: NEGATIVE
Resp Syncytial Virus by PCR: NEGATIVE
SARS Coronavirus 2 by RT PCR: NEGATIVE

## 2022-08-14 LAB — BRAIN NATRIURETIC PEPTIDE: B Natriuretic Peptide: >4500 pg/mL — ABNORMAL HIGH (ref 0.0–100.0)

## 2022-08-14 LAB — TROPONIN I (HIGH SENSITIVITY): Troponin I (High Sensitivity): 460 ng/L (ref <18)

## 2022-08-14 LAB — LIPASE, BLOOD: Lipase: 41 U/L (ref 11–51)

## 2022-08-14 MED ORDER — ROSUVASTATIN CALCIUM 10 MG PO TABS
10.0000 mg | ORAL_TABLET | Freq: Every day | ORAL | Status: DC
  Administered 2022-08-14 – 2022-08-15 (×2): 10 mg via ORAL
  Filled 2022-08-14 (×3): qty 1

## 2022-08-14 MED ORDER — SODIUM CHLORIDE 0.9% FLUSH
3.0000 mL | Freq: Two times a day (BID) | INTRAVENOUS | Status: DC
  Administered 2022-08-14 – 2022-08-16 (×3): 3 mL via INTRAVENOUS

## 2022-08-14 MED ORDER — EZETIMIBE 10 MG PO TABS
10.0000 mg | ORAL_TABLET | Freq: Every day | ORAL | Status: DC
  Administered 2022-08-14 – 2022-08-16 (×3): 10 mg via ORAL
  Filled 2022-08-14 (×3): qty 1

## 2022-08-14 MED ORDER — ACETAMINOPHEN 325 MG PO TABS
650.0000 mg | ORAL_TABLET | ORAL | Status: DC | PRN
  Administered 2022-08-15: 650 mg via ORAL
  Filled 2022-08-14: qty 2

## 2022-08-14 MED ORDER — INSULIN ASPART 100 UNIT/ML IJ SOLN: 0.0000 [IU] | Freq: Three times a day (TID) | INTRAMUSCULAR | Status: DC

## 2022-08-14 MED ORDER — ONDANSETRON HCL 4 MG/2ML IJ SOLN: 4.0000 mg | Freq: Four times a day (QID) | INTRAMUSCULAR | Status: DC | PRN

## 2022-08-14 MED ORDER — NITROGLYCERIN 0.4 MG SL SUBL
0.4000 mg | SUBLINGUAL_TABLET | SUBLINGUAL | Status: DC | PRN
  Administered 2022-08-14: 0.4 mg via SUBLINGUAL
  Filled 2022-08-14 (×3): qty 1

## 2022-08-14 MED ORDER — HYDRALAZINE HCL 50 MG PO TABS
100.0000 mg | ORAL_TABLET | Freq: Three times a day (TID) | ORAL | Status: DC
  Administered 2022-08-14 – 2022-08-16 (×5): 100 mg via ORAL
  Filled 2022-08-14 (×6): qty 2

## 2022-08-14 MED ORDER — INSULIN DETEMIR 100 UNIT/ML ~~LOC~~ SOLN
15.0000 [IU] | Freq: Every day | SUBCUTANEOUS | Status: DC
  Administered 2022-08-14 – 2022-08-15 (×2): 15 [IU] via SUBCUTANEOUS
  Filled 2022-08-14 (×3): qty 0.15

## 2022-08-14 MED ORDER — ISOSORBIDE MONONITRATE ER 60 MG PO TB24
30.0000 mg | ORAL_TABLET | Freq: Two times a day (BID) | ORAL | Status: DC
  Administered 2022-08-14: 30 mg via ORAL
  Filled 2022-08-14 (×2): qty 1

## 2022-08-14 MED ORDER — SODIUM CHLORIDE 0.9% FLUSH
3.0000 mL | INTRAVENOUS | Status: DC | PRN
  Administered 2022-08-14: 3 mL via INTRAVENOUS

## 2022-08-14 MED ORDER — INSULIN ASPART 100 UNIT/ML IJ SOLN
0.0000 [IU] | Freq: Every day | INTRAMUSCULAR | Status: DC
  Administered 2022-08-14 – 2022-08-15 (×2): 3 [IU] via SUBCUTANEOUS
  Filled 2022-08-14 (×2): qty 1

## 2022-08-14 MED ORDER — LOSARTAN POTASSIUM 50 MG PO TABS
100.0000 mg | ORAL_TABLET | Freq: Every day | ORAL | Status: DC
  Administered 2022-08-14 – 2022-08-16 (×3): 100 mg via ORAL
  Filled 2022-08-14 (×3): qty 2

## 2022-08-14 MED ORDER — SODIUM CHLORIDE 0.9 % IV SOLN: 250.0000 mL | INTRAVENOUS | Status: DC | PRN

## 2022-08-14 MED ORDER — PANTOPRAZOLE SODIUM 20 MG PO TBEC
20.0000 mg | DELAYED_RELEASE_TABLET | Freq: Every day | ORAL | Status: DC
  Administered 2022-08-16: 20 mg via ORAL
  Filled 2022-08-14 (×2): qty 1

## 2022-08-14 MED ORDER — ASPIRIN 81 MG PO TBEC
81.0000 mg | DELAYED_RELEASE_TABLET | Freq: Every day | ORAL | Status: DC
  Administered 2022-08-14 – 2022-08-16 (×2): 81 mg via ORAL
  Filled 2022-08-14 (×2): qty 1

## 2022-08-14 MED ORDER — HEPARIN SODIUM (PORCINE) 5000 UNIT/ML IJ SOLN
5000.0000 [IU] | Freq: Three times a day (TID) | INTRAMUSCULAR | Status: DC
  Administered 2022-08-14 – 2022-08-16 (×4): 5000 [IU] via SUBCUTANEOUS
  Filled 2022-08-14 (×5): qty 1

## 2022-08-14 MED ORDER — CHLORHEXIDINE GLUCONATE CLOTH 2 % EX PADS
6.0000 | MEDICATED_PAD | Freq: Every day | CUTANEOUS | Status: DC
  Administered 2022-08-16: 6 via TOPICAL
  Filled 2022-08-14: qty 6

## 2022-08-14 MED ORDER — GUAIFENESIN-DM 100-10 MG/5ML PO SYRP
5.0000 mL | ORAL_SOLUTION | ORAL | Status: DC | PRN
  Administered 2022-08-14: 5 mL via ORAL
  Filled 2022-08-14: qty 10

## 2022-08-14 MED ORDER — ALBUTEROL SULFATE (2.5 MG/3ML) 0.083% IN NEBU: 3.0000 mL | INHALATION_SOLUTION | RESPIRATORY_TRACT | Status: DC | PRN

## 2022-08-14 MED ORDER — CARVEDILOL 25 MG PO TABS
25.0000 mg | ORAL_TABLET | Freq: Two times a day (BID) | ORAL | Status: DC
  Administered 2022-08-14 – 2022-08-16 (×4): 25 mg via ORAL
  Filled 2022-08-14 (×2): qty 1
  Filled 2022-08-14: qty 4
  Filled 2022-08-14: qty 1

## 2022-08-14 MED ORDER — FUROSEMIDE 10 MG/ML IJ SOLN
80.0000 mg | Freq: Two times a day (BID) | INTRAMUSCULAR | Status: DC
  Administered 2022-08-15: 80 mg via INTRAVENOUS
  Filled 2022-08-14: qty 8

## 2022-08-14 MED ORDER — HYDRALAZINE HCL 20 MG/ML IJ SOLN: 10.0000 mg | Freq: Four times a day (QID) | INTRAMUSCULAR | Status: DC | PRN

## 2022-08-14 MED ORDER — INSULIN ASPART 100 UNIT/ML IJ SOLN
8.0000 [IU] | Freq: Once | INTRAMUSCULAR | Status: AC
  Administered 2022-08-14: 8 [IU] via INTRAVENOUS
  Filled 2022-08-14: qty 1

## 2022-08-14 MED ORDER — CLOPIDOGREL BISULFATE 75 MG PO TABS
75.0000 mg | ORAL_TABLET | Freq: Every day | ORAL | Status: DC
  Administered 2022-08-14 – 2022-08-16 (×3): 75 mg via ORAL
  Filled 2022-08-14 (×3): qty 1

## 2022-08-14 MED ORDER — NITROGLYCERIN 0.4 MG SL SUBL: 0.4000 mg | SUBLINGUAL_TABLET | SUBLINGUAL | Status: DC | PRN

## 2022-08-14 MED ORDER — NEPRO/CARBSTEADY PO LIQD
237.0000 mL | Freq: Three times a day (TID) | ORAL | Status: DC
  Administered 2022-08-14 – 2022-08-16 (×3): 237 mL via ORAL

## 2022-08-14 MED ORDER — FUROSEMIDE 10 MG/ML IJ SOLN
80.0000 mg | Freq: Once | INTRAMUSCULAR | Status: AC
  Administered 2022-08-14: 80 mg via INTRAVENOUS
  Filled 2022-08-14: qty 8

## 2022-08-14 NOTE — H&P (Addendum)
HISTORY AND PHYSICAL    Jimmy Olson   [id number] DOB: 28-May-1961   Date of Service: 08/14/22 Requesting physician/APP from ED: Treatment Team:  Attending Provider: Emeterio Reeve, DO  PCP: [facility] [place]     HPI: Past medical history of end-stage renal hemodialysis on Tuesday Thursday Saturday his last session was yesterday he did not miss any dialysis sessions, heart failure, insulin-dependent type 2 diabetic, CAD, who presents to the emergency department with orthopnea exertional dyspnea worsening over the past day but he notes he consistently has trouble breathing at night. Mild cough, persistent, no fever/chills. Reports some pressure in chest. No chest pain w/ exertion. No LE edema. Reports compliance w/ meds other than insulin.   In ED - received lasix 80 mg IV x1, hyperglycemic d/t didn't take insulin for 2 days PTA so was given Novolog 8 units x1. CXR (+)pleural effusions. US guided thoracentesis ordered    Consultants:  Nephrology - secure chat sent to Dr. Juleen China 08/14/22 5:29 PM and chat was acknowledged.   Procedures: none      ASSESSMENT & PLAN:   Principal Problem:   Pleural effusion due to CHF (congestive heart failure) (HCC) Active Problems:   Acute on chronic HFrEF (heart failure with reduced ejection fraction) (HCC)   Hypertensive urgency   HFrEF (heart failure with reduced ejection fraction) (HCC)   Chest pain   Coronary artery disease of native artery of native heart with stable angina pectoris (HCC)   CAD S/P percutaneous coronary angioplasty   ESRD on hemodialysis ([place])   Uncontrolled type 2 diabetes mellitus with hyperglycemia, with long-term current use of insulin (HCC)   HLD (hyperlipidemia)   Demand ischemia   Hyperlipidemia associated with type 2 diabetes mellitus (HCC)   Shortness of breath   History of anemia due to CKD   Depression   Acute respiratory failure with hypoxia (HCC)   Ischemic  cardiomyopathy   Pseudohyponatremia   Fluid overload  #Acute hypoxic respiratory failure responsive to [place] O2 #Respiratory failure d/t bilateral pleural effusions #Pleural effusions d/t HFrEF (heart failure with reduced ejection fraction) ([place]) #Chest pain #Coronary artery disease of native artery of native heart with stable angina pectoris ([place]) #Demand ischemia #Hypertensive urgency possible precipitating chest pain and HFrEF exacerbation #Ischemic cardiomyopathy recent echocardiogram 07/11/22 about 1 month ago - EF 35-40%, moderately decreased fxn LV, global hypokinesis, G1DD.  US thoracentesis ordered Lasix x1 given, will hold further diuresis for now pending output Strict I&O (pt does make some urine), daily weights, fluid restricted diet  Trend troponin and EKG - expect elevated troponins in ESRD Telemetry  Supplemental O2 as needed  Recent cardiac cath and echo w/ [facility], will consult them to assist management but suspect fluid overload best addressed w/ thoracentesis and HD at this point but may need inotropes   #ESRD on hemodialysis [facility]) #Fluid overload Nephrology consulted to maintain on HD   #Uncontrolled type 2 diabetes mellitus with hyperglycemia, with long-term current use of insulin ([place]) Basal + SSI  #HLD (hyperlipidemia) #Hyperlipidemia associated with type 2 diabetes mellitus (HCC) statin  #History of anemia due to CKD #Stable anemia Monitor CBC  #Pseudohyponatremia d/t hyperglycemia Monitor BMP    DVT prophylaxis: heparin Pertinent IV fluids/nutrition: no IV fluids, fluid restricted diet  Central lines / invasive devices: dialysis catheter   Code Status: FULL CODE Family Communication: none at this time - patient is in contact w/ his daughter and wife   Disposition: inpatient, progressive  TOC needs: none  at this time Barriers to discharge / significant pending items: illness as above, high risk decompensation                    Review of Systems:  Review of Systems  Constitutional:  Positive for malaise/fatigue. Negative for chills and fever.  Respiratory:  Positive for cough and shortness of breath. Negative for hemoptysis, sputum production and wheezing.   Cardiovascular:  Positive for chest pain and orthopnea. Negative for palpitations, claudication and leg swelling.  Gastrointestinal:  Negative for abdominal pain, blood in stool, constipation, diarrhea, nausea and vomiting.  Musculoskeletal:  Negative for myalgias.  Neurological:  Positive for tingling (L hand, has resolved) and weakness. Negative for dizziness, speech change and loss of consciousness.  Psychiatric/Behavioral:  Negative for depression and substance abuse.        has a past medical history of Anemia, Anxiety and depression, ATN (acute tubular necrosis) (HCC), CAD (coronary artery disease), Chronic HFrEF (heart failure with reduced ejection fraction) ([place]), DVT of axillary vein, acute left ([place]) (05/2021), ESRD (end stage renal disease) on dialysis [facility]), History of 2019 novel coronavirus disease (COVID-19) (03/06/2020), HLD (hyperlipidemia), Hyperkalemia (12/23/2020), Hypertension, Left carotid artery stenosis (08/18/2021), Long term current use of anticoagulant, Mixed Ischemic & Nonischemic Cardiomyopathy, NSTEMI (non-ST elevated myocardial infarction) ([place]) (07/15/2020), Sepsis ([place]), Suicidal ideations, TIA (transient ischemic attack) (06/2021), and Type 2 diabetes mellitus treated with insulin ([place]). (Not in an outpatient encounter)   No Known Allergies    family history includes Diabetes in his brother; Heart failure in his mother; Kidney failure in his mother; Other in his father. Past Surgical History:  Procedure Laterality Date   APPENDECTOMY     AV FISTULA PLACEMENT Left 09/29/2021   Procedure: ARTERIOVENOUS (AV) FISTULA CREATION ( RADIAL CEPHALIC);  Surgeon: Algernon Huxley, MD;  Location: [facility] ORS;   Service: Vascular;  Laterality: Left;   CARDIAC CATHETERIZATION     CORONARY ATHERECTOMY N/A 06/07/2022   Procedure: CORONARY ATHERECTOMY;  Surgeon: Wellington Hampshire, MD;  Location: [place] [facility];  Service: Cardiovascular;  Laterality: N/A;   CORONARY STENT INTERVENTION N/A 06/07/2022   Procedure: CORONARY STENT INTERVENTION;  Surgeon: Wellington Hampshire, MD;  Location: [place] [facility];  Service: Cardiovascular;  Laterality: N/A;   DIALYSIS/PERMA CATHETER INSERTION N/A 06/02/2021   Procedure: DIALYSIS/PERMA CATHETER INSERTION;  Surgeon: Algernon Huxley, MD;  Location: [place] [facility];  Service: Cardiovascular;  Laterality: N/A;   INTRAVASCULAR IMAGING/OCT N/A 06/07/2022   Procedure: INTRAVASCULAR IMAGING/OCT;  Surgeon: Wellington Hampshire, MD;  Location: [place] [facility];  Service: Cardiovascular;  Laterality: N/A;   LEFT HEART CATH AND CORONARY ANGIOGRAPHY N/A 06/03/2021   Procedure: LEFT HEART CATH AND CORONARY ANGIOGRAPHY;  Surgeon: Wellington Hampshire, MD;  Location: [place] [facility];  Service: Cardiovascular;  Laterality: N/A;   LEFT HEART CATH AND CORONARY ANGIOGRAPHY N/A 07/13/2022   Procedure: LEFT HEART CATH AND CORONARY ANGIOGRAPHY;  Surgeon: Leonie Man, MD;  Location: [place] [facility];  Service: Cardiovascular;  Laterality: N/A;   LIGATION OF ARTERIOVENOUS  FISTULA Left 10/02/2021   Procedure: LIGATION OF ARTERIOVENOUS  FISTULA;  Surgeon: Evaristo Bury, MD;  Location: [facility] OR[facility];  Service: Vascular;  Laterality: Left;   NASAL SINUS SURGERY     RIGHT/LEFT HEART CATH AND CORONARY ANGIOGRAPHY N/A 07/19/2020   Procedure: RIGHT/LEFT HEART CATH AND CORONARY ANGIOGRAPHY;  Surgeon: Wellington Hampshire, MD;  Location: [place] [facility];  Service: Cardiovascular;  Laterality: N/A;  RIGHT/LEFT HEART CATH AND CORONARY ANGIOGRAPHY N/A 05/26/2022   Procedure: RIGHT/LEFT HEART CATH AND CORONARY ANGIOGRAPHY;  Surgeon: Wellington Hampshire, MD;  Location: [place] [facility];   Service: Cardiovascular;  Laterality: N/A;    No current facility-administered medications on file prior to encounter.   Current Outpatient Medications on File Prior to Encounter  Medication Sig Dispense Refill   acetaminophen (TYLENOL) 325 MG tablet Take 650 mg by mouth every 6 (six) hours as needed.     albuterol (PROVENTIL HFA) 108 (90 Base) MCG/ACT inhaler Inhale 2 puffs into the lungs every 4 (four) hours as needed for wheezing or shortness of breath. 1 each 0   aspirin EC 81 MG tablet Take 1 tablet (81 mg total) by mouth daily. Swallow whole. 90 tablet 3   carvedilol (COREG) 25 MG tablet Take 1 tablet (25 mg total) by mouth 2 (two) times daily. 30 tablet 0   clopidogrel (PLAVIX) 75 MG tablet Take 1 tablet (75 mg total) by mouth daily. 30 tablet 0   escitalopram (LEXAPRO) 10 MG tablet Take 1 tablet (10 mg total) by mouth at bedtime. 30 tablet 0   ezetimibe (ZETIA) 10 MG tablet Take 1 tablet (10 mg total) by mouth daily. 30 tablet 1   hydrALAZINE (APRESOLINE) 100 MG tablet Take 1 tablet (100 mg total) by mouth 2 (two) times daily. 60 tablet 0   insulin detemir (LEVEMIR FLEXPEN) 100 UNIT/ML FlexPen Inject 15 Units into the skin at bedtime. 15 mL 5   isosorbide mononitrate (IMDUR) 30 MG 24 hr tablet Take 1 tablet (30 mg total) by mouth in the morning and at bedtime. 30 tablet 3   losartan (COZAAR) 100 MG tablet Take 1 tablet (100 mg total) by mouth daily. 30 tablet 0   nitroGLYCERIN (NITROSTAT) 0.4 MG SL tablet Place 1 tablet (0.4 mg total) under the tongue every 5 (five) minutes as needed for chest pain. 25 tablet 0   NOVOLOG FLEXPEN 100 UNIT/ML FlexPen Inject 0-15 Units into the skin 3 (three) times daily with meals. 0-15 units tid 0-5 units qhs     Nutritional Supplements (FEEDING SUPPLEMENT, NEPRO CARB STEADY,) LIQD Take 237 mLs by mouth 3 (three) times daily between meals.  0   pantoprazole (PROTONIX) 20 MG tablet Take 1 tablet (20 mg total) by mouth daily. 30 tablet 0   rosuvastatin  (CRESTOR) 10 MG tablet Take 1 tablet (10 mg total) by mouth daily. 30 tablet 1   torsemide (DEMADEX) 20 MG tablet Take 1 tablet (20 mg total) by mouth daily. Takes on Sat and Sun 30 tablet 0   [DISCONTINUED] gabapentin (NEURONTIN) 300 MG capsule Take 1 capsule (300 mg total) by mouth at bedtime for 7 days. 7 capsule 0         Objective Findings:  Vitals:   08/14/22 1438 08/14/22 1442 08/14/22 1610  BP: (!) 173/86  (!) 192/90  Pulse: 84  84  Resp: 18  (!) 23  Temp: 98.2 F (36.8 C)    SpO2: 96%  96%  Weight:  68 kg   Height:  '5\' 6"'$  (1.676 m)    No intake or output data in the 24 hours ending 08/14/22 1820 Filed Weights   08/14/22 1442  Weight: 68 kg    Examination:  Physical Exam Constitutional:      General: He is not in acute distress.    Appearance: He is ill-appearing.  Cardiovascular:     Rate and Rhythm: Normal rate and regular rhythm.  Pulmonary:     Effort: Tachypnea and accessory muscle usage present. No respiratory distress.     Breath sounds: Examination of the right-middle field reveals rales. Examination of the left-middle field reveals rales. Examination of the right-lower field reveals decreased breath sounds. Examination of the left-lower field reveals decreased breath sounds. Decreased breath sounds and rales present.  Abdominal:     General: Bowel sounds are normal.     Palpations: Abdomen is soft.  Musculoskeletal:     Right lower leg: No edema.     Left lower leg: No edema.  Skin:    General: Skin is warm and dry.  Neurological:     General: No focal deficit present.     Mental Status: He is alert and oriented to person, place, and time.  Psychiatric:        Mood and Affect: Mood normal.        Behavior: Behavior normal.          Scheduled Medications:   aspirin EC  81 mg Oral Daily   carvedilol  25 mg Oral BID   clopidogrel  75 mg Oral Daily   ezetimibe  10 mg Oral Daily   feeding supplement (NEPRO CARB STEADY)  237 mL Oral TID BM    furosemide  80 mg Intravenous BID   heparin  5,000 Units Subcutaneous Q8H   hydrALAZINE  100 mg Oral Q8H   insulin detemir  15 Units Subcutaneous QHS   isosorbide mononitrate  60 mg Oral Daily   losartan  100 mg Oral Daily   pantoprazole  20 mg Oral Daily   rosuvastatin  10 mg Oral Daily   sodium chloride flush  3 mL Intravenous Q12H    Continuous Infusions:  sodium chloride      PRN Medications:  sodium chloride, acetaminophen, albuterol, hydrALAZINE, nitroGLYCERIN, nitroGLYCERIN, ondansetron (ZOFRAN) IV, sodium chloride flush  Antimicrobials:  Anti-infectives (From admission, onward)    None           Data Reviewed: I have personally reviewed following labs and imaging studies  CBC: Recent Labs  Lab 08/14/22 1447  WBC 4.1  NEUTROABS 2.9  HGB 8.9*  HCT 26.0*  MCV 88.7  PLT [id number]*   Basic Metabolic Panel: Recent Labs  Lab 08/14/22 1447  NA 132*  K 3.8  CL 99  CO2 23  GLUCOSE 590*  BUN 36*  CREATININE 4.78*  CALCIUM 8.6*   GFR: Estimated Creatinine Clearance: 15 mL/min (A) (by C-G formula based on SCr of 4.78 mg/dL (H)). Liver Function Tests: Recent Labs  Lab 08/14/22 1447  AST 47*  ALT 74*  ALKPHOS 89  BILITOT 1.2  PROT 6.0*  ALBUMIN 3.4*   Recent Labs  Lab 08/14/22 1447  LIPASE 41   No results for input(s): "AMMONIA" in the last 168 hours. Coagulation Profile: No results for input(s): "INR", "PROTIME" in the last 168 hours. Cardiac Enzymes: No results for input(s): "CKTOTAL", "CKMB", "CKMBINDEX", "TROPONINI" in the last 168 hours. BNP (last 3 results) No results for input(s): "PROBNP" in the last 8760 hours. HbA1C: No results for input(s): "HGBA1C" in the last 72 hours. CBG: No results for input(s): "GLUCAP" in the last 168 hours. Lipid Profile: No results for input(s): "CHOL", "HDL", "LDLCALC", "TRIG", "CHOLHDL", "LDLDIRECT" in the last 72 hours. Thyroid Function Tests: No results for input(s): "TSH", "T4TOTAL", "FREET4",  "T3FREE", "THYROIDAB" in the last 72 hours. Anemia Panel: No results for input(s): "VITAMINB12", "FOLATE", "FERRITIN", "TIBC", "IRON", "RETICCTPCT" in the last 72  hours. Most Recent Urinalysis On File:     Component Value Date/Time   COLORURINE STRAW (A) 07/11/2022 0127   APPEARANCEUR HAZY (A) 07/11/2022 0127   LABSPEC 1.009 07/11/2022 0127   PHURINE 7.0 07/11/2022 0127   GLUCOSEU >=500 (A) 07/11/2022 0127   HGBUR SMALL (A) 07/11/2022 0127   BILIRUBINUR NEGATIVE 07/11/2022 0127   KETONESUR NEGATIVE 07/11/2022 0127   PROTEINUR >=300 (A) 07/11/2022 0127   NITRITE NEGATIVE 07/11/2022 0127   LEUKOCYTESUR NEGATIVE 07/11/2022 0127   Sepsis Labs: '@LABRCNTIP'$ (procalcitonin:4,lacticidven:4)  Recent Results (from the past 240 hour(s))  Resp panel by RT-PCR (RSV, Flu A&B, Covid) Anterior Nasal Swab     Status: None   Collection Time: 08/14/22  4:30 PM   Specimen: Anterior Nasal Swab  Result Value Ref Range Status   SARS Coronavirus 2 by RT PCR NEGATIVE NEGATIVE Final    Comment: (NOTE) SARS-CoV-2 target nucleic acids are NOT DETECTED.  The SARS-CoV-2 RNA is generally detectable in upper respiratory specimens during the acute phase of infection. The lowest concentration of SARS-CoV-2 viral copies this assay can detect is 138 copies/mL. A negative result does not preclude SARS-Cov-2 infection and should not be used as the sole basis for treatment or other patient management decisions. A negative result may occur with  improper specimen collection/handling, submission of specimen other than nasopharyngeal swab, presence of viral mutation(s) within the areas targeted by this assay, and inadequate number of viral copies(<138 copies/mL). A negative result must be combined with clinical observations, patient history, and epidemiological information. The expected result is Negative.  Fact Sheet for Patients:  EntrepreneurPulse.com.au  Fact Sheet for Healthcare Providers:   IncredibleEmployment.be  This test is no t yet approved or cleared by the [place] FDA and  has been authorized for detection and/or diagnosis of SARS-CoV-2 by FDA under an Emergency Use Authorization (EUA). This EUA will remain  in effect (meaning this test can be used) for the duration of the COVID-19 declaration under Section 564(b)(1) of the Act, 21 U.S.C.section 360bbb-3(b)(1), unless the authorization is terminated  or revoked sooner.       Influenza A by PCR NEGATIVE NEGATIVE Final   Influenza B by PCR NEGATIVE NEGATIVE Final    Comment: (NOTE) The Xpert Xpress SARS-CoV-2/FLU/RSV plus assay is intended as an aid in the diagnosis of influenza from Nasopharyngeal swab specimens and should not be used as a sole basis for treatment. Nasal washings and aspirates are unacceptable for Xpert Xpress SARS-CoV-2/FLU/RSV testing.  Fact Sheet for Patients: EntrepreneurPulse.com.au  Fact Sheet for Healthcare Providers: IncredibleEmployment.be  This test is not yet approved or cleared by the [place] FDA and has been authorized for detection and/or diagnosis of SARS-CoV-2 by FDA under an Emergency Use Authorization (EUA). This EUA will remain in effect (meaning this test can be used) for the duration of the COVID-19 declaration under Section 564(b)(1) of the Act, 21 U.S.C. section 360bbb-3(b)(1), unless the authorization is terminated or revoked.     Resp Syncytial Virus by PCR NEGATIVE NEGATIVE Final    Comment: (NOTE) Fact Sheet for Patients: EntrepreneurPulse.com.au  Fact Sheet for Healthcare Providers: IncredibleEmployment.be  This test is not yet approved or cleared by the [place] FDA and has been authorized for detection and/or diagnosis of SARS-CoV-2 by FDA under an Emergency Use Authorization (EUA). This EUA will remain in effect (meaning this test can be used) for  the duration of the COVID-19 declaration under Section 564(b)(1) of the Act, 21 U.S.C. section 360bbb-3(b)(1), unless the authorization is  terminated or revoked.  Performed at [facility], [address] [place], [place] [address]          Radiology Studies: DG Chest 2 View  Result Date: 08/14/2022 CLINICAL DATA:  Shortness of breath EXAM: CHEST - 2 VIEW COMPARISON:  X-ray 07/10/2022 and older FINDINGS: Underinflation. Small bilateral pleural effusions with some adjacent opacities. Atelectasis versus infiltrate. No pneumothorax. Slight vascular congestion. No edema today. Stable cardiopericardial silhouette. Degenerative changes of the spine. Stable double lumen right IJ catheter. IMPRESSION: Underinflation with developing bilateral small effusions and adjacent opacities. Atelectasis versus infiltrate. Recommend follow-up. Right IJ catheter. Electronically Signed   By: Jill Side M.D.   On: 08/14/2022 15:33             LOS: 0 days       Emeterio Reeve, DO [facility] 08/14/2022, 6:20 PM    Dictation software may have been used to generate the above note. Typos may occur and escape review in typed/dictated notes. Please contact Dr Sheppard Coil directly for clarity if needed.  Staff may message me via secure chat in [place]  but this may not receive an immediate response,  please page me for urgent matters!  If 7PM-7AM, please contact night coverage [email or web address]

## 2022-08-14 NOTE — ED Notes (Signed)
Pt states he has left arm/hand pain that started today.

## 2022-08-14 NOTE — Hospital Course (Signed)
Past medical history of end-stage renal hemodialysis on Tuesday Thursday Saturday his last session was yesterday he did not miss any dialysis sessions, heart failure, insulin-dependent type 2 diabetic, CAD, who presents to the emergency department with orthopnea exertional dyspnea worsening over the past day but he notes he consistently has trouble breathing at night. Mild cough, persistent, no fever/chills. Reports some pressure in chest. No chest pain w/ exertion. No LE edema. Reports compliance w/ meds other than insulin.  02/26: received lasix 80 mg IV x1, hyperglycemic d/t didn't take insulin for 2 days PTA so was given Novolog 8 units x1. CXR (+)pleural effusions. US guided thoracentesis ordered 02/27: dialysis removed 1500 mL, R thoracentesis removed 800 mL. Net IO Since Admission: -2,050 mL [08/15/22 1545]. Pt is on room air. Advanced HF team saw patient - recommending an additional dialysis tomorrow Continuing Coreg 25 mg twice daily, Plavix 75 mg daily, Lasix 80 IV twice daily, hydralazine 100 3 times daily. Increasing Imdur to 60 mg and adding on amlodipine 5 mg. Pt requests hospital bed and home O2, TOC following for this DME.        Consultants:  Nephrology  Cardiology    Procedures: R Thoracentesis 08/15/22 800 cc clear yellow fluid            ASSESSMENT & PLAN:   Principal Problem:   Pleural effusion due to CHF (congestive heart failure) (HCC) Active Problems:   Acute on chronic HFrEF (heart failure with reduced ejection fraction) (HCC)   Hypertensive urgency   HFrEF (heart failure with reduced ejection fraction) (HCC)   Chest pain   Coronary artery disease of native artery of native heart with stable angina pectoris ([place])   CAD S/P percutaneous coronary angioplasty   ESRD on hemodialysis ([place])   Uncontrolled type 2 diabetes mellitus with hyperglycemia, with long-term current use of insulin ([place])   HLD (hyperlipidemia)   Demand ischemia   Hyperlipidemia associated with  type 2 diabetes mellitus (HCC)   Shortness of breath   History of anemia due to CKD   Depression   Acute respiratory failure with hypoxia (HCC)   Ischemic cardiomyopathy   Pseudohyponatremia   Fluid overload   #Acute hypoxic respiratory failure responsive to [place] O2 --> resolved now on RA #Pleural effusions s/p R Thoracentesis 08/15/22 800 cc clear yellow fluid  #HFrEF #Chest pain  #CAD #Demand ischemia #Hypertensive urgency  #Ischemic cardiomyopathy recent echocardiogram 07/11/22 about 1 month ago - EF 35-40%, moderately decreased fxn LV, global hypokinesis, G1DD.  S/p R thoracentesis 08/15/22 removal 800 cc  Follow pleural fluid labs but likely d/t CHF Strict I&O (pt does make some urine), daily weights, fluid restricted diet  Telemetry  Supplemental O2 as needed  Strongly consider palliative consult pending clinical course  high risk decompensation and readmission, anticipate discharge in another 1-2 days, if he is reaccumulating fluid rapidly may need repeat thoracentesis  Per cardiology maintain Coreg 25 mg twice daily, Plavix 75 mg daily, Lasix 80 IV twice daily, hydralazine 100 3 times daily. Increasing Imdur to 60 mg and adding on amlodipine 5 mg.    #ESRD on hemodialysis [facility]) #Fluid overload Nephrology consulted to maintain on HD    #Uncontrolled type 2 diabetes mellitus with hyperglycemia, with long-term current use of insulin  Basal + SSI   #HLD (hyperlipidemia) #Hyperlipidemia associated with type 2 diabetes mellitus (HCC) statin   #History of anemia due to CKD #Stable anemia Monitor CBC   #Pseudohyponatremia d/t hyperglycemia Monitor BMP  DVT prophylaxis: heparin Pertinent IV fluids/nutrition: no IV fluids, fluid restricted diet  Central lines / invasive devices: dialysis catheter    Code Status: FULL CODE   Disposition: inpatient, progressive  TOC needs: DME - pt requesting home hospital bed and O2 Barriers to discharge / significant pending  items: illness as above, high risk decompensation and readmission, will plan on discharge in another 1-2 days after another dialysis per cardiology recs, if he is reaccumulating fluid rapidly may need repeat thoracentesis

## 2022-08-14 NOTE — ED Provider Notes (Signed)
[facility] Provider Note    Event Date/Time   First MD Initiated Contact with Patient 08/14/22 1616     (approximate)   History   Shortness of Breath   HPI  Jimmy Olson is a 62 y.o. male   Past medical history of end-stage renal hemodialysis on Tuesday Thursday Saturday his last session was yesterday he did not miss any dialysis sessions, heart failure, insulin-dependent type 2 diabetic, CAD, who presents to the emergency department with orthopnea exertional dyspnea since last night.  He has a baseline cough that is unchanged no fever.  He denies chest pain but does have some left hand pain that started last night as well.  He does still make urine.  He did not take his insulin last night due to shortness of breath.  He did not take his insulin this morning either.  Independent Historian contributed to assessment above: Wife was at bedside  External Medical Documents Reviewed: Discharge summary dated 07/15/2022 for acute hypoxemic respiratory failure secondary to hypertensive urgency and flash pulmonary edema, HHS,      Physical Exam   Triage Vital Signs: ED Triage Vitals  Enc Vitals Group     BP 08/14/22 1438 (!) 173/86     Pulse Rate 08/14/22 1438 84     Resp 08/14/22 1438 18     Temp 08/14/22 1438 98.2 F (36.8 C)     Temp src --      SpO2 08/14/22 1438 96 %     Weight 08/14/22 1442 149 lb 14.6 oz (68 kg)     Height 08/14/22 1442 '5\' 6"'$  (1.676 m)     Head Circumference --      Peak Flow --      Pain Score 08/14/22 1441 4     Pain Loc --      Pain Edu? --      Excl. in [place]? --     Most recent vital signs: Vitals:   08/14/22 1438 08/14/22 1610  BP: (!) 173/86 (!) 192/90  Pulse: 84 84  Resp: 18 (!) 23  Temp: 98.2 F (36.8 C)   SpO2: 96% 96%    General: Awake, no distress.  CV:  Good peripheral perfusion.  Resp:  Normal effort.  Abd:  No distention.  Other:  tachypnea, speaking in full sentences rales at bilateral bases no  focality skin warm well-perfused no obvious peripheral edema or abdominal distention.  Radial pulses intact bilaterally no fever abdomen soft and nontender   ED Results / Procedures / Treatments   Labs (all labs ordered are listed, but only abnormal results are displayed) Labs Reviewed  CBC WITH DIFFERENTIAL/PLATELET - Abnormal; Notable for the following components:      Result Value   RBC 2.93 (*)    Hemoglobin 8.9 (*)    HCT 26.0 (*)    Platelets 123 (*)    All other components within normal limits  BASIC METABOLIC PANEL - Abnormal; Notable for the following components:   Sodium 132 (*)    Glucose, Bld 590 (*)    BUN 36 (*)    Creatinine, Ser 4.78 (*)    Calcium 8.6 (*)    GFR, Estimated 13 (*)    All other components within normal limits  RESP PANEL BY RT-PCR (RSV, FLU A&B, COVID)  [id number]  HEPATIC FUNCTION PANEL  LIPASE, BLOOD  BRAIN NATRIURETIC PEPTIDE     I ordered and reviewed the above labs they are notable for  his glucose is elevated at 590 but anion gap is normal.  Hemoglobin at baseline.  EKG  ED ECG REPORT I, Lucillie Garfinkel, the attending physician, personally viewed and interpreted this ECG.   Date: 08/14/2022  EKG Time: 1445  Rate: 82  Rhythm: nsr  Axis: nl  Intervals: Slightly prolonged QTc at 479  ST&T Change: No acute ischemic changes, no STEMI    RADIOLOGY I independently reviewed and interpreted chest x-ray see no obvious focality or pneumothorax   PROCEDURES:  Critical Care performed: No  Procedures   MEDICATIONS ORDERED IN ED: Medications  furosemide (LASIX) injection 80 mg (has no administration in time range)  nitroGLYCERIN (NITROSTAT) SL tablet 0.4 mg (has no administration in time range)  insulin aspart (novoLOG) injection 8 Units (has no administration in time range)    External physician / consultants:  I spoke with hospitalist for admission and regarding care plan for this patient.   IMPRESSION / MDM / ASSESSMENT AND PLAN / ED  COURSE  I reviewed the triage vital signs and the nursing notes.                                Patient's presentation is most consistent with acute presentation with potential threat to life or bodily function.  Differential diagnosis includes, but is not limited to, pulmonary edema due to hypertension, respiratory infection, ACS, PE, electrolyte derangement   The patient is on the cardiac monitor to evaluate for evidence of arrhythmia and/or significant heart rate changes.  MDM: Patient with orthopnea and exertional dyspnea and hypertension with rales on examination most likely due to pulmonary edema.  No other acute respiratory infectious symptoms.  He does have some left hand pain, history of CAD will check for ACS with EKG which fortunately is nonischemic no STEMI and follow-up with serial troponins.  Blood pressure is elevated 190/90 we will give some nitroglycerin as well as IV Lasix for diuresis.  No need for emergent dialysis at this point.  Hyperglycemia in the setting of not taking his insulin last night and this morning we will give some IV insulin, no evidence of DKA on lab work.  Admission.        FINAL CLINICAL IMPRESSION(S) / ED DIAGNOSES   Final diagnoses:  Exertional dyspnea  Orthopnea  Acute pulmonary edema (HCC)  End stage renal disease on dialysis ([place])  Hyperglycemia     Rx / DC Orders   ED Discharge Orders     None        Note:  This document was prepared using Dragon voice recognition software and may include unintentional dictation errors.    Lucillie Garfinkel, MD 08/14/22 [id number]

## 2022-08-14 NOTE — ED Notes (Signed)
Report received from Tiffany, RN.

## 2022-08-14 NOTE — ED Triage Notes (Signed)
Using interpreter, pt endorses trouble breathing at night for the past few days. Mild SOB during the day. Last HD tx was Saturday.

## 2022-08-15 ENCOUNTER — Encounter: Payer: Self-pay | Admitting: Osteopathic Medicine

## 2022-08-15 ENCOUNTER — Other Ambulatory Visit: Payer: Self-pay

## 2022-08-15 ENCOUNTER — Inpatient Hospital Stay: Payer: Medicare Other

## 2022-08-15 DIAGNOSIS — I509 Heart failure, unspecified: Secondary | ICD-10-CM | POA: Diagnosis not present

## 2022-08-15 DIAGNOSIS — I160 Hypertensive urgency: Secondary | ICD-10-CM

## 2022-08-15 LAB — PROTEIN, PLEURAL OR PERITONEAL FLUID: Total protein, fluid: <3 g/dL

## 2022-08-15 LAB — BODY FLUID CELL COUNT WITH DIFFERENTIAL
Eos, Fluid: 0 %
Lymphs, Fluid: 64 %
Monocyte-Macrophage-Serous Fluid: 34 %
Neutrophil Count, Fluid: 2 %
Total Nucleated Cell Count, Fluid: 77 cu mm

## 2022-08-15 LAB — RENAL FUNCTION PANEL
Albumin: 3.2 g/dL — ABNORMAL LOW (ref 3.5–5.0)
Anion gap: 8 (ref 5–15)
BUN: 38 mg/dL — ABNORMAL HIGH (ref 6–20)
CO2: 25 mmol/L (ref 22–32)
Calcium: 8.9 mg/dL (ref 8.9–10.3)
Chloride: 104 mmol/L (ref 98–111)
Creatinine, Ser: 5.11 mg/dL — ABNORMAL HIGH (ref 0.61–1.24)
GFR, Estimated: 12 mL/min — ABNORMAL LOW (ref >60)
Glucose, Bld: 158 mg/dL — ABNORMAL HIGH (ref 70–99)
Phosphorus: 4.3 mg/dL (ref 2.5–4.6)
Potassium: 3.4 mmol/L — ABNORMAL LOW (ref 3.5–5.1)
Sodium: 137 mmol/L (ref 135–145)

## 2022-08-15 LAB — CBC
HCT: 26 % — ABNORMAL LOW (ref 39.0–52.0)
Hemoglobin: 8.9 g/dL — ABNORMAL LOW (ref 13.0–17.0)
MCH: 30.2 pg (ref 26.0–34.0)
MCHC: 34.2 g/dL (ref 30.0–36.0)
MCV: 88.1 fL (ref 80.0–100.0)
Platelets: 128 10*3/uL — ABNORMAL LOW (ref 150–400)
RBC: 2.95 MIL/uL — ABNORMAL LOW (ref 4.22–5.81)
RDW: 13 % (ref 11.5–15.5)
WBC: 4.1 10*3/uL (ref 4.0–10.5)
nRBC: 0 % (ref 0.0–0.2)

## 2022-08-15 LAB — TROPONIN I (HIGH SENSITIVITY)
Troponin I (High Sensitivity): 408 ng/L (ref <18)
Troponin I (High Sensitivity): 426 ng/L (ref <18)

## 2022-08-15 LAB — HEPATITIS B SURFACE ANTIGEN: Hepatitis B Surface Ag: NONREACTIVE

## 2022-08-15 LAB — GLUCOSE, CAPILLARY: Glucose-Capillary: 294 mg/dL — ABNORMAL HIGH (ref 70–99)

## 2022-08-15 LAB — LACTATE DEHYDROGENASE, PLEURAL OR PERITONEAL FLUID: LD, Fluid: 38 U/L — ABNORMAL HIGH (ref 3–23)

## 2022-08-15 LAB — CBG MONITORING, ED
Glucose-Capillary: 110 mg/dL — ABNORMAL HIGH (ref 70–99)
Glucose-Capillary: 110 mg/dL — ABNORMAL HIGH (ref 70–99)

## 2022-08-15 LAB — BRAIN NATRIURETIC PEPTIDE: B Natriuretic Peptide: >4500 pg/mL — ABNORMAL HIGH (ref 0.0–100.0)

## 2022-08-15 MED ORDER — LIDOCAINE HCL (PF) 1 % IJ SOLN
10.0000 mL | Freq: Once | INTRAMUSCULAR | Status: AC
  Administered 2022-08-15: 10 mL via INTRADERMAL

## 2022-08-15 MED ORDER — HEPARIN SODIUM (PORCINE) 1000 UNIT/ML IJ SOLN
INTRAMUSCULAR | Status: AC
  Administered 2022-08-15: 1000 [IU]
  Filled 2022-08-15: qty 10

## 2022-08-15 MED ORDER — HEPARIN SODIUM (PORCINE) 1000 UNIT/ML DIALYSIS: 1000.0000 [IU] | INTRAMUSCULAR | Status: DC | PRN

## 2022-08-15 MED ORDER — ISOSORBIDE MONONITRATE ER 60 MG PO TB24
60.0000 mg | ORAL_TABLET | Freq: Every day | ORAL | Status: DC
  Administered 2022-08-16: 60 mg via ORAL
  Filled 2022-08-15: qty 1

## 2022-08-15 NOTE — ED Notes (Signed)
Patient reports headache

## 2022-08-15 NOTE — Discharge Planning (Signed)
Jimmy Olson  [address], [place] [address] [phone]  Scheduled Days Tuesday Thursday and Saturday   Treatment Time: 11:00am Met with patient today, patient voiced some concerns of breathing difficulty at home. Stated it was hard for him to sleep flat. Asked if he would like a hospital type bed, patient stated he would. Caryl Pina SW is aware of this and is looking to see if patient qualifies for a hospital bed.

## 2022-08-15 NOTE — Procedures (Signed)
PROCEDURE SUMMARY:  Successful US guided therapeutic right thoracentesis. Yielded 800 cc of clear, yellow fluid. Pt tolerated procedure well. No immediate complications.  Specimen not sent for labs. CXR ordered.  EBL < 1 mL  Tyson Alias, AGNP 08/15/2022 3:30 PM

## 2022-08-15 NOTE — Progress Notes (Signed)
PROGRESS NOTE    Jimmy Olson   [id number] DOB: 1960/06/24  DOA: 08/14/2022 Date of Service: 08/15/22 PCP: [facility] [place]     Brief Narrative / Hospital Course:  Past medical history of end-stage renal hemodialysis on Tuesday Thursday Saturday his last session was yesterday he did not miss any dialysis sessions, heart failure, insulin-dependent type 2 diabetic, CAD, who presents to the emergency department with orthopnea exertional dyspnea worsening over the past day but he notes he consistently has trouble breathing at night. Mild cough, persistent, no fever/chills. Reports some pressure in chest. No chest pain w/ exertion. No LE edema. Reports compliance w/ meds other than insulin.  02/26: received lasix 80 mg IV x1, hyperglycemic d/t didn't take insulin for 2 days PTA so was given Novolog 8 units x1. CXR (+)pleural effusions. US guided thoracentesis ordered 02/27: dialysis removed 1500 mL, R thoracentesis removed 800 mL. Net IO Since Admission: -2,050 mL [08/15/22 1545]. Pt is on room air. Advanced HF team saw patient - recommending an additional dialysis tomorrow Continuing Coreg 25 mg twice daily, Plavix 75 mg daily, Lasix 80 IV twice daily, hydralazine 100 3 times daily. Increasing Imdur to 60 mg and adding on amlodipine 5 mg. Pt requests hospital bed and home O2, TOC following for this DME.        Consultants:  Nephrology  Cardiology    Procedures: R Thoracentesis 08/15/22 800 cc clear yellow fluid            ASSESSMENT & PLAN:   Principal Problem:   Pleural effusion due to CHF (congestive heart failure) (HCC) Active Problems:   Acute on chronic HFrEF (heart failure with reduced ejection fraction) (HCC)   Hypertensive urgency   HFrEF (heart failure with reduced ejection fraction) (HCC)   Chest pain   Coronary artery disease of native artery of native heart with stable angina pectoris ([place])   CAD S/P percutaneous coronary angioplasty   ESRD  on hemodialysis ([place])   Uncontrolled type 2 diabetes mellitus with hyperglycemia, with long-term current use of insulin ([place])   HLD (hyperlipidemia)   Demand ischemia   Hyperlipidemia associated with type 2 diabetes mellitus (HCC)   Shortness of breath   History of anemia due to CKD   Depression   Acute respiratory failure with hypoxia (HCC)   Ischemic cardiomyopathy   Pseudohyponatremia   Fluid overload   #Acute hypoxic respiratory failure responsive to [place] O2 --> resolved now on RA #Pleural effusions s/p R Thoracentesis 08/15/22 800 cc clear yellow fluid  #HFrEF #Chest pain  #CAD #Demand ischemia #Hypertensive urgency  #Ischemic cardiomyopathy recent echocardiogram 07/11/22 about 1 month ago - EF 35-40%, moderately decreased fxn LV, global hypokinesis, G1DD.  S/p R thoracentesis 08/15/22 removal 800 cc  Follow pleural fluid labs but likely d/t CHF Strict I&O (pt does make some urine), daily weights, fluid restricted diet  Telemetry  Supplemental O2 as needed  Strongly consider palliative consult pending clinical course  high risk decompensation and readmission, anticipate discharge in another 1-2 days, if he is reaccumulating fluid rapidly may need repeat thoracentesis  Per cardiology maintain Coreg 25 mg twice daily, Plavix 75 mg daily, Lasix 80 IV twice daily, hydralazine 100 3 times daily. Increasing Imdur to 60 mg and adding on amlodipine 5 mg.    #ESRD on hemodialysis [facility]) #Fluid overload Nephrology consulted to maintain on HD    #Uncontrolled type 2 diabetes mellitus with hyperglycemia, with long-term current use of insulin  Basal + SSI   #  HLD (hyperlipidemia) #Hyperlipidemia associated with type 2 diabetes mellitus (HCC) statin   #History of anemia due to CKD #Stable anemia Monitor CBC   #Pseudohyponatremia d/t hyperglycemia Monitor BMP       DVT prophylaxis: heparin Pertinent IV fluids/nutrition: no IV fluids, fluid restricted diet  Central lines /  invasive devices: dialysis catheter    Code Status: FULL CODE   Disposition: inpatient, progressive  TOC needs: DME - pt requesting home hospital bed and O2 Barriers to discharge / significant pending items: illness as above, high risk decompensation and readmission, will plan on discharge in another 1-2 days after another dialysis per cardiology recs, if he is reaccumulating fluid rapidly may need repeat thoracentesis                Subjective / Brief ROS:  Patient reports breathing is better today  Denies CP/SOB.  Pain controlled.  Denies new weakness.  Reports no concerns w/ urination/defecation.   Family Communication: none at this time, pt declines call to anyone     Objective Findings:  Vitals:   08/15/22 1301 08/15/22 1421 08/15/22 1454 08/15/22 1550  BP:  (!) 184/93 (!) 150/77 (!) 148/74  Pulse:  76 72 76  Resp:  (!) 22  20  Temp:  98.4 F (36.9 C)  98.4 F (36.9 C)  TempSrc:      SpO2:  99% 100% 98%  Weight: 66 kg     Height:        Intake/Output Summary (Last 24 hours) at 08/15/2022 1628 Last data filed at 08/15/2022 1250 Gross per 24 hour  Intake --  Output 2050 ml  Net -2050 ml   Filed Weights   08/14/22 1442 08/15/22 0845 08/15/22 1301  Weight: 68 kg 67.5 kg 66 kg    Examination:  Physical Exam Constitutional:      General: He is not in acute distress.    Appearance: He is well-developed.  Pulmonary:     Effort: Pulmonary effort is normal. No tachypnea.     Breath sounds: Examination of the right-lower field reveals decreased breath sounds and rales. Examination of the left-lower field reveals decreased breath sounds and rales. Decreased breath sounds and rales present.  Abdominal:     Palpations: Abdomen is soft.  Musculoskeletal:        General: Normal range of motion.     Right lower leg: Edema (+1) present.     Left lower leg: Edema (+1) present.  Skin:    General: Skin is dry.     Coloration: Skin is not pale.  Neurological:      General: No focal deficit present.     Mental Status: He is alert and oriented to person, place, and time.  Psychiatric:        Mood and Affect: Mood normal.        Behavior: Behavior normal.          Scheduled Medications:   aspirin EC  81 mg Oral Daily   carvedilol  25 mg Oral BID   Chlorhexidine Gluconate Cloth  6 each Topical Q0600   clopidogrel  75 mg Oral Daily   ezetimibe  10 mg Oral Daily   feeding supplement (NEPRO CARB STEADY)  237 mL Oral TID BM   furosemide  80 mg Intravenous BID   heparin  5,000 Units Subcutaneous Q8H   hydrALAZINE  100 mg Oral Q8H   insulin aspart  0-5 Units Subcutaneous QHS   insulin aspart  0-9 Units Subcutaneous  TID WC   insulin detemir  15 Units Subcutaneous QHS   [START ON 08/16/2022] isosorbide mononitrate  60 mg Oral Daily   losartan  100 mg Oral Daily   pantoprazole  20 mg Oral Daily   rosuvastatin  10 mg Oral Daily   sodium chloride flush  3 mL Intravenous Q12H    Continuous Infusions:  sodium chloride      PRN Medications:  sodium chloride, acetaminophen, albuterol, guaiFENesin-dextromethorphan, heparin, hydrALAZINE, nitroGLYCERIN, ondansetron (ZOFRAN) IV, sodium chloride flush  Antimicrobials from admission:  Anti-infectives (From admission, onward)    None           Data Reviewed:  I have personally reviewed the following...  CBC: Recent Labs  Lab 08/14/22 1447 08/14/22 2014 08/15/22 0409  WBC 4.1 4.3 4.1  NEUTROABS 2.9  --   --   HGB 8.9* 9.4* 8.9*  HCT 26.0* 27.2* 26.0*  MCV 88.7 88.3 88.1  PLT 123* 129* 0000000*   Basic Metabolic Panel: Recent Labs  Lab 08/14/22 1447 08/14/22 2014 08/15/22 0409  NA 132*  --  137  K 3.8  --  3.4*  CL 99  --  104  CO2 23  --  25  GLUCOSE 590*  --  158*  BUN 36*  --  38*  CREATININE 4.78* 4.94* 5.11*  CALCIUM 8.6*  --  8.9  PHOS  --   --  4.3   GFR: Estimated Creatinine Clearance: 14 mL/min (A) (by C-G formula based on SCr of 5.11 mg/dL (H)). Liver Function  Tests: Recent Labs  Lab 08/14/22 1447 08/15/22 0409  AST 47*  --   ALT 74*  --   ALKPHOS 89  --   BILITOT 1.2  --   PROT 6.0*  --   ALBUMIN 3.4* 3.2*   Recent Labs  Lab 08/14/22 1447  LIPASE 41   No results for input(s): "AMMONIA" in the last 168 hours. Coagulation Profile: No results for input(s): "INR", "PROTIME" in the last 168 hours. Cardiac Enzymes: No results for input(s): "CKTOTAL", "CKMB", "CKMBINDEX", "TROPONINI" in the last 168 hours. BNP (last 3 results) No results for input(s): "PROBNP" in the last 8760 hours. HbA1C: No results for input(s): "HGBA1C" in the last 72 hours. CBG: Recent Labs  Lab 08/14/22 2142 08/15/22 0832 08/15/22 1514  GLUCAP 281* 110* 110*   Lipid Profile: No results for input(s): "CHOL", "HDL", "LDLCALC", "TRIG", "CHOLHDL", "LDLDIRECT" in the last 72 hours. Thyroid Function Tests: No results for input(s): "TSH", "T4TOTAL", "FREET4", "T3FREE", "THYROIDAB" in the last 72 hours. Anemia Panel: No results for input(s): "VITAMINB12", "FOLATE", "FERRITIN", "TIBC", "IRON", "RETICCTPCT" in the last 72 hours. Most Recent Urinalysis On File:     Component Value Date/Time   COLORURINE STRAW (A) 07/11/2022 0127   APPEARANCEUR HAZY (A) 07/11/2022 0127   LABSPEC 1.009 07/11/2022 0127   PHURINE 7.0 07/11/2022 0127   GLUCOSEU >=500 (A) 07/11/2022 0127   HGBUR SMALL (A) 07/11/2022 0127   BILIRUBINUR NEGATIVE 07/11/2022 0127   KETONESUR NEGATIVE 07/11/2022 0127   PROTEINUR >=300 (A) 07/11/2022 0127   NITRITE NEGATIVE 07/11/2022 0127   LEUKOCYTESUR NEGATIVE 07/11/2022 0127   Sepsis Labs: '@LABRCNTIP'$ (procalcitonin:4,lacticidven:4) Microbiology: Recent Results (from the past 240 hour(s))  Resp panel by RT-PCR (RSV, Flu A&B, Covid) Anterior Nasal Swab     Status: None   Collection Time: 08/14/22  4:30 PM   Specimen: Anterior Nasal Swab  Result Value Ref Range Status   SARS Coronavirus 2 by RT PCR NEGATIVE NEGATIVE Final  Comment:  (NOTE) SARS-CoV-2 target nucleic acids are NOT DETECTED.  The SARS-CoV-2 RNA is generally detectable in upper respiratory specimens during the acute phase of infection. The lowest concentration of SARS-CoV-2 viral copies this assay can detect is 138 copies/mL. A negative result does not preclude SARS-Cov-2 infection and should not be used as the sole basis for treatment or other patient management decisions. A negative result may occur with  improper specimen collection/handling, submission of specimen other than nasopharyngeal swab, presence of viral mutation(s) within the areas targeted by this assay, and inadequate number of viral copies(<138 copies/mL). A negative result must be combined with clinical observations, patient history, and epidemiological information. The expected result is Negative.  Fact Sheet for Patients:  EntrepreneurPulse.com.au  Fact Sheet for Healthcare Providers:  IncredibleEmployment.be  This test is no t yet approved or cleared by the [place] FDA and  has been authorized for detection and/or diagnosis of SARS-CoV-2 by FDA under an Emergency Use Authorization (EUA). This EUA will remain  in effect (meaning this test can be used) for the duration of the COVID-19 declaration under Section 564(b)(1) of the Act, 21 U.S.C.section 360bbb-3(b)(1), unless the authorization is terminated  or revoked sooner.       Influenza A by PCR NEGATIVE NEGATIVE Final   Influenza B by PCR NEGATIVE NEGATIVE Final    Comment: (NOTE) The Xpert Xpress SARS-CoV-2/FLU/RSV plus assay is intended as an aid in the diagnosis of influenza from Nasopharyngeal swab specimens and should not be used as a sole basis for treatment. Nasal washings and aspirates are unacceptable for Xpert Xpress SARS-CoV-2/FLU/RSV testing.  Fact Sheet for Patients: EntrepreneurPulse.com.au  Fact Sheet for Healthcare  Providers: IncredibleEmployment.be  This test is not yet approved or cleared by the [place] FDA and has been authorized for detection and/or diagnosis of SARS-CoV-2 by FDA under an Emergency Use Authorization (EUA). This EUA will remain in effect (meaning this test can be used) for the duration of the COVID-19 declaration under Section 564(b)(1) of the Act, 21 U.S.C. section 360bbb-3(b)(1), unless the authorization is terminated or revoked.     Resp Syncytial Virus by PCR NEGATIVE NEGATIVE Final    Comment: (NOTE) Fact Sheet for Patients: EntrepreneurPulse.com.au  Fact Sheet for Healthcare Providers: IncredibleEmployment.be  This test is not yet approved or cleared by the [place] FDA and has been authorized for detection and/or diagnosis of SARS-CoV-2 by FDA under an Emergency Use Authorization (EUA). This EUA will remain in effect (meaning this test can be used) for the duration of the COVID-19 declaration under Section 564(b)(1) of the Act, 21 U.S.C. section 360bbb-3(b)(1), unless the authorization is terminated or revoked.  Performed at [facility], [address], [place], [place] [address]       Radiology Studies last 3 days: US THORACENTESIS ASP PLEURAL SPACE W/IMG GUIDE  Result Date: 08/15/2022 INDICATION: History of DM type 2, CHF, CAD and ESRD on HD. Patient admitted for DOE and orthopnea found to have pleural effusion. Patient received for therapeutic thoracentesis. EXAM: ULTRASOUND GUIDED THERAPEUTIC RIGHT THORACENTESIS MEDICATIONS: 10 mL 1 % lidocaine COMPLICATIONS: None immediate. PROCEDURE: An ultrasound guided thoracentesis was thoroughly discussed with the patient and questions answered. The benefits, risks, alternatives and complications were also discussed. The patient understands and wishes to proceed with the procedure. Written consent was obtained. Ultrasound was performed to localize  and mark an adequate pocket of fluid in the right chest. The area was then prepped and draped in the normal sterile fashion. 1% Lidocaine  was used for local anesthesia. Under ultrasound guidance a 6 Fr Safe-T-Centesis catheter was introduced. Thoracentesis was performed. The catheter was removed and a dressing applied. FINDINGS: A total of approximately 800 cc of clear, yellow fluid was removed. IMPRESSION: Successful ultrasound guided right thoracentesis yielding 800 cc of pleural fluid. Read by: Narda Rutherford, AGNP-BC Electronically Signed   By: Ruthann Cancer M.D.   On: 08/15/2022 15:46   DG Chest Port 1 View  Result Date: 08/15/2022 CLINICAL DATA:  Status post right thoracentesis. EXAM: PORTABLE CHEST 1 VIEW COMPARISON:  August 14, 2022. FINDINGS: No pneumothorax status post right thoracentesis. No significant residual pleural effusion is noted. IMPRESSION: No pneumothorax status post right thoracentesis. Electronically Signed   By: Marijo Conception M.D.   On: 08/15/2022 15:11   DG Chest 2 View  Result Date: 08/14/2022 CLINICAL DATA:  Shortness of breath EXAM: CHEST - 2 VIEW COMPARISON:  X-ray 07/10/2022 and older FINDINGS: Underinflation. Small bilateral pleural effusions with some adjacent opacities. Atelectasis versus infiltrate. No pneumothorax. Slight vascular congestion. No edema today. Stable cardiopericardial silhouette. Degenerative changes of the spine. Stable double lumen right IJ catheter. IMPRESSION: Underinflation with developing bilateral small effusions and adjacent opacities. Atelectasis versus infiltrate. Recommend follow-up. Right IJ catheter. Electronically Signed   By: Jill Side M.D.   On: 08/14/2022 15:33             LOS: 1 day      Emeterio Reeve, DO [facility] 08/15/2022, 4:29 PM    Dictation software may have been used to generate the above note. Typos may occur and escape review in typed/dictated notes. Please contact Dr Sheppard Coil directly for  clarity if needed.  Staff may message me via secure chat in [place]  but this may not receive an immediate response,  please page me for urgent matters!  If 7PM-7AM, please contact night coverage [email or web address]

## 2022-08-15 NOTE — ED Notes (Addendum)
Report given to Rommell with diaylsis

## 2022-08-15 NOTE — Consult Note (Signed)
ADVANCED HEART FAILURE CONSULT NOTE  Referring Physician: No ref. provider found  Primary Care: [facility] [place] Primary Cardiologist:  HPI: Jimmy Olson is a 62 y.o. male with coronary artery disease status post PCI to the mid LAD in December 2023, heart failure with reduced EF (EF 40 to 45%), type 2 diabetes, uncontrolled hypertension, hyperlipidemia, history of TIA, history of DVT and ESRD on hemodialysis currently admitted to [facility] with complaints of progressively worsening shortness of breath over the past several days.  Over the past 1 year, Jimmy Olson has had several admissions for hypertensive emergency and shortness of breat with blood pressures reaching as high as 210/106.  According to Jimmy Olson, he has been compliant with all medications and hemodialysis.  Over the past 3 to 4 days he has noticed worsening shortness of breath until last night he can no longer sleep due to severe dyspnea.  He came to [facility] emergency department where chest x-ray was significant for pleural effusion that is now status post thoracentesis.  In addition he was hypertensive to 199/106 and hypervolemic on exam.  He is also undergone 1 session of dialysis and now reports complete resolution of symptoms.   Past Medical History:  Diagnosis Date   Anemia    Anxiety and depression    ATN (acute tubular necrosis) (HCC)    CAD (coronary artery disease)    a. 06/2020 Cath: Nonobs dzs; b. 05/2021 NSTEMI/Cath: Nonobs dzs->demand isch; c. 05/2022 Cath: LM mild dzs, LAD 40ost/p, 41m D1 70, RI mild dzs, LCX small, mild dzs, RCA 20p, 358m, RPDA 40; d. 05/2022 Staged PCI: Orbital atherectomy of mLAD  (2.75x48 Synergy XD DES).   Chronic HFrEF (heart [place] with reduced [place] fraction) ([place]   a. 08/2019 Echo: EF 45-50%; b. 06/2020 Echo: EF 30-35%; c. 04/2022 Echo: EF 40-45%, mild LVH, small apical region of noncompaction, GrII DD, nl RV fxn.   DVT of axillary vein, acute left ([place]    a.) Tx'd with apixaban   [facility]   a.) M-W-F   History of 2019 novel coronavirus disease (COVID-19) 03/06/2020   a.) s/p Tx with monoclonal Ab infusion   HLD (hyperlipidemia)    Hyperkalemia 12/23/2020   Hypertension    Left carotid artery stenosis 08/18/2021   a.) Carotid Doppler [id number] stenosis   Long term current use of anticoagulant    a.) apixaban for DVT   Mixed Ischemic & Nonischemic Cardiomyopathy    a. 08/2019 Echo: EF 45-50%; b. 06/2020 Echo: EF 30-35%; c. 04/2022 Echo: EF 40-45%, mild LVH, small apical region of noncompaction, GrII DD, nl RV fxn.   NSTEMI (non-ST elevated myocardial infarction) ([facility]   Sepsis ([place]   Suicidal ideations    TIA (transient ischemic attack) 06/2021   Type 2 diabetes mellitus treated with insulin (HCC)     Current Facility-Administered Medications  Medication Dose Route Frequency Provider Last Rate Last Admin   0.9 %  sodium chloride infusion  250 mL Intravenous PRN AlEmeterio ReeveDO       acetaminophen (TYLENOL) tablet 650 mg  650 mg Oral Q4H PRN AlEmeterio ReeveDO   650 mg at 08/15/22 0408   albuterol (PROVENTIL) (2.5 MG/3ML) 0.083% nebulizer solution 3 mL  3 mL Inhalation Q4H PRN AlEmeterio ReeveDO       aspirin EC tablet 81 mg  81 mg Oral Daily AlEmeterio ReeveDO   81 mg  at 08/14/22 1949   carvedilol (COREG) tablet 25 mg  25 mg Oral BID Emeterio Reeve, DO   25 mg at 08/15/22 1542   Chlorhexidine Gluconate Cloth 2 % PADS 6 each  6 each Topical Q0600 Kolluru, Lurena Nida, MD       clopidogrel (PLAVIX) tablet 75 mg  75 mg Oral Daily Emeterio Reeve, DO   75 mg at 08/15/22 1540   ezetimibe (ZETIA) tablet 10 mg  10 mg Oral Daily Emeterio Reeve, DO   10 mg at 08/15/22 1540   feeding supplement (NEPRO CARB STEADY) liquid 237 mL  237 mL Oral TID BM Emeterio Reeve, DO   237 mL at 08/14/22 2103   furosemide (LASIX) injection 80 mg  80 mg Intravenous BID  Emeterio Reeve, DO       guaiFENesin-dextromethorphan (ROBITUSSIN DM) 100-10 MG/5ML syrup 5 mL  5 mL Oral Q4H PRN Emeterio Reeve, DO   5 mL at 08/14/22 2149   heparin injection 1,000 Units  1,000 Units Intracatheter PRN Lavonia Dana, MD   1,000 Units at 08/15/22 1319   heparin injection 5,000 Units  5,000 Units Subcutaneous Q8H Emeterio Reeve, DO   5,000 Units at 08/15/22 [id number]   hydrALAZINE (APRESOLINE) injection 10 mg  10 mg Intravenous Q6H PRN Emeterio Reeve, DO       hydrALAZINE (APRESOLINE) tablet 100 mg  100 mg Oral Q8H Emeterio Reeve, DO   100 mg at 08/15/22 1533   insulin aspart (novoLOG) injection 0-5 Units  0-5 Units Subcutaneous QHS Emeterio Reeve, DO   3 Units at 08/14/22 2150   insulin aspart (novoLOG) injection 0-9 Units  0-9 Units Subcutaneous TID WC Emeterio Reeve, DO       insulin detemir (LEVEMIR) injection 15 Units  15 Units Subcutaneous QHS Emeterio Reeve, DO   15 Units at 08/14/22 2149   isosorbide mononitrate (IMDUR) 24 hr tablet 30 mg  30 mg Oral BID Emeterio Reeve, DO   30 mg at 08/14/22 1948   losartan (COZAAR) tablet 100 mg  100 mg Oral Daily Emeterio Reeve, DO   100 mg at 08/15/22 1541   nitroGLYCERIN (NITROSTAT) SL tablet 0.4 mg  0.4 mg Sublingual Q5 min PRN Lucillie Garfinkel, MD   0.4 mg at 08/14/22 1736   ondansetron (ZOFRAN) injection 4 mg  4 mg Intravenous Q6H PRN Emeterio Reeve, DO       pantoprazole (PROTONIX) EC tablet 20 mg  20 mg Oral Daily Emeterio Reeve, DO       rosuvastatin (CRESTOR) tablet 10 mg  10 mg Oral Daily Emeterio Reeve, DO   10 mg at 08/14/22 2150   sodium chloride flush (NS) 0.9 % injection 3 mL  3 mL Intravenous Q12H Emeterio Reeve, DO   3 mL at 08/14/22 2156   sodium chloride flush (NS) 0.9 % injection 3 mL  3 mL Intravenous PRN Emeterio Reeve, DO   3 mL at 08/14/22 2056   Current Outpatient Medications  Medication Sig Dispense Refill   acetaminophen (TYLENOL) 325 MG tablet Take 650 mg by  mouth every 6 (six) hours as needed.     aspirin EC 81 MG tablet Take 1 tablet (81 mg total) by mouth daily. Swallow whole. 90 tablet 3   carvedilol (COREG) 25 MG tablet Take 1 tablet (25 mg total) by mouth 2 (two) times daily. 30 tablet 0   clopidogrel (PLAVIX) 75 MG tablet Take 1 tablet (75 mg total) by mouth daily. 30 tablet 0   escitalopram (LEXAPRO) 10 MG tablet  Take 1 tablet (10 mg total) by mouth at bedtime. 30 tablet 0   ezetimibe (ZETIA) 10 MG tablet Take 1 tablet (10 mg total) by mouth daily. 30 tablet 1   hydrALAZINE (APRESOLINE) 100 MG tablet Take 1 tablet (100 mg total) by mouth 2 (two) times daily. 60 tablet 0   insulin detemir (LEVEMIR FLEXPEN) 100 UNIT/ML FlexPen Inject 15 Units into the skin at bedtime. (Patient taking differently: Inject 20 Units into the skin daily.) 15 mL 5   isosorbide mononitrate (IMDUR) 30 MG 24 hr tablet Take 1 tablet (30 mg total) by mouth in the morning and at bedtime. 30 tablet 3   losartan (COZAAR) 100 MG tablet Take 1 tablet (100 mg total) by mouth daily. 30 tablet 0   nitroGLYCERIN (NITROSTAT) 0.4 MG SL tablet Place 1 tablet (0.4 mg total) under the tongue every 5 (five) minutes as needed for chest pain. 25 tablet 0   NOVOLOG FLEXPEN 100 UNIT/ML FlexPen Inject 0-15 Units into the skin 3 (three) times daily with meals. 0-15 units tid 0-5 units qhs     pantoprazole (PROTONIX) 20 MG tablet Take 1 tablet (20 mg total) by mouth daily. 30 tablet 0   rosuvastatin (CRESTOR) 10 MG tablet Take 1 tablet (10 mg total) by mouth daily. 30 tablet 1   torsemide (DEMADEX) 20 MG tablet Take 1 tablet (20 mg total) by mouth daily. Takes on Sat and Sun 30 tablet 0   albuterol (PROVENTIL HFA) 108 (90 Base) MCG/ACT inhaler Inhale 2 puffs into the lungs every 4 (four) hours as needed for wheezing or shortness of breath. 1 each 0   Nutritional Supplements (FEEDING SUPPLEMENT, NEPRO CARB STEADY,) LIQD Take 237 mLs by mouth 3 (three) times daily between meals.  0    No Known  Allergies    Social History   Socioeconomic History   Marital status: Married    Spouse name: Ana   Number of children: 7   Years of education: Not on file   Highest education level: Not on file  Occupational History   Not on file  Tobacco Use   Smoking status: Never   Smokeless tobacco: Never  Vaping Use   Vaping Use: Never used  Substance and Sexual Activity   Alcohol use: Not Currently   Drug use: Never   Sexual activity: Not on file  Other Topics Concern   Not on file  Social History Narrative   Lives locally with wife and son.  He is currently unemployed - has worked in different industries.     Social Determinants of Health   Financial Resource Strain: Not on file  Food Insecurity: No Food Insecurity (08/15/2022)   Hunger Vital Sign    Worried About Running Out of Food in the Last Year: Never true    Ran Out of Food in the Last Year: Never true  Transportation Needs: No Transportation Needs (08/15/2022)   PRAPARE - Hydrologist (Medical): No    Lack of Transportation (Non-Medical): No  Physical Activity: Not on file  Stress: Not on file  Social Connections: Not on file  Intimate Partner Violence: Not At Risk (08/15/2022)   Humiliation, Afraid, Rape, and Kick questionnaire    Fear of Current or Ex-Partner: No    Emotionally Abused: No    Physically Abused: No    Sexually Abused: No      Family History  Problem Relation Age of Onset   Kidney failure Mother  died @ 52   Heart failure Mother    Other Father        he never knew his father   Diabetes Brother     PHYSICAL EXAM: Vitals:   08/15/22 1454 08/15/22 1550  BP: (!) 150/77 (!) 148/74  Pulse: 72 76  Resp:  20  Temp:  98.4 F (36.9 C)  SpO2: 100% 98%   GENERAL: Chronically ill-appearing Hispanic male NECK: Supple, No masses. Normal carotid upstrokes without bruits. No masses or thyromegaly.    CHEST: There are no chest wall deformities. There is no chest wall  tenderness. Respirations are unlabored.  Lungs-CTA bilaterally CARDIAC:  JVP: 10 cm H2O         Normal S1, S2  Normal rate with regular rhythm. No murmurs, rubs or gallops.  Pulses are 2+ and symmetrical in upper and lower extremities. 1+ edema.  ABDOMEN: Soft, non-tender, non-distended. There are no masses or hepatomegaly. There are normal bowel sounds.  EXTREMITIES: Warm and well perfused with no cyanosis, clubbing.  LYMPHATIC: No axillary or supraclavicular lymphadenopathy.  NEUROLOGIC: Patient is oriented x3 with no focal or lateralizing neurologic deficits.  PSYCH: Patients affect is appropriate, there is no evidence of anxiety or depression.  SKIN: Warm and dry; no lesions or wounds.   DATA REVIEW  ECG: Normal sinus rhythm  ECHO: 07/11/2022 LVEF 35 to 40%, normal RV function.  No significant mitral regurgitation.  CATH: 07/13/2022 Ost LAD to Prox LAD lesion is 50% stenosed.  Prox LAD lesion is 40% stenosed.   Prox LAD to Mid LAD stent is widely patent with minimal-5% stenosed.   1st Diag lesion is 45% stenosed.   Prox RCA lesion is 20% stenosed.  Mid RCA lesion is 30% stenosed.  Dist RCA lesion is 40% stenosed.  RPDA lesion is 40% stenosed.   LV end diastolic pressure is severely elevated.   There is no aortic valve stenosis.   Post-Cath Diagnoses Widely patent LAD stent with otherwise minimal CAD from last catheterization Severely elevated LVEDP of 28 mmHg indicating pressure overload (acute On Chronic Combined Systolic and Diastolic Heart Failure). Suspect this is the etiology for elevated troponin levels. Would not call this a non-ST elevation myocardial infarction.    ASSESSMENT & PLAN:  Acute on chronic heart failure with reduced EF exacerbation -Likely secondary to inadequate volume removal during dialysis and uncontrolled hypertension.  Patient reports compliance with all medications and states that he has not missed any dialysis sessions.  On exam today he is still  mildly hypervolemic, would plan for 1 additional dialysis session tomorrow morning before discharge. -Continue Coreg 25 mg twice daily, Plavix 75 mg daily, Lasix 80 IV twice daily, hydralazine 100 3 times daily.  Increasing Imdur to 60 mg and adding on amlodipine 5 mg.  2.  Hypertensive urgency -Blood pressure of 190 on admission consistent with prior admissions for shortness of breath. -Will uptitrate antihypertensives as noted above.   Oneida Mckamey Advanced Heart Failure Mechanical Circulatory Support

## 2022-08-15 NOTE — Progress Notes (Signed)
[facility]  ROUNDING NOTE   Subjective:   Jimmy Olson is a 62 y.o. male with past medical history including diabetes, heart failure, CAD, and end stage renal disease on hemodialysis. Patient presents with shortness of breath and has been admitted for Orthopnea [R06.01] Acute pulmonary edema ([place]) [J81.0] Exertional dyspnea [R06.09] Hyperglycemia [R73.9] End stage renal disease on dialysis ([address]) [N18.6, Z99.2] Fluid overload [E87.70]  Patient is known to our practice and receives outpatient dialysis treatments at [facility] on a TTS schedule. Last treatment received on Saturday.   Patient seen and evaluated during dialysis.   HEMODIALYSIS FLOWSHEET:  Blood Flow Rate (mL/min): 350 mL/min Arterial Pressure (mmHg): -120 mmHg Venous Pressure (mmHg): 150 mmHg TMP (mmHg): 10 mmHg Ultrafiltration Rate (mL/min): 6866 mL/min Dialysate Flow Rate (mL/min): 300 ml/min Dialysis Fluid Bolus: Normal Saline Bolus Amount (mL): 300 mL  Reports progressive shortness of breath for a day. Room air at baseline. Feels like the shortness of breath worsens at night. Denies recent missed treatments. Requesting oxygen at discharge.   Labs on ED arrival include sodium 132, glucose 590, BUN 36, and creatinine 4.78 with GFR 13. BNP greater than 4500, troponin 460, and hgb 8.9. Respiratory panel negative for RSV, Covid 19 and influenza. Chest xray shows developing bilateral small effusions and opacities.   We have been consulted to manage dialysis needs.    Objective:  Vital signs in last 24 hours:  Temp:  [97.8 F (36.6 C)-98.2 F (36.8 C)] 97.9 F (36.6 C) (02/27 0845) Pulse Rate:  [69-95] 77 (02/27 1130) Resp:  [12-29] 19 (02/27 1130) BP: (131-199)/(69-106) 177/99 (02/27 1130) SpO2:  [92 %-100 %] 99 % (02/27 1130) Weight:  [67.5 kg-68 kg] 67.5 kg (02/27 0845)  Weight change:  Filed Weights   08/14/22 1442 08/15/22 0845  Weight: 68 kg 67.5 kg    Intake/Output: I/O  last 3 completed shifts: In: -  Out: 550 [Urine:550]   Intake/Output this shift:  No intake/output data recorded.  Physical Exam: General: NAD, ill appearing  Head: Normocephalic, atraumatic. Moist oral mucosal membranes  Eyes: Anicteric  Lungs:  Clear to auscultation, normal effort  Heart: Regular rate and rhythm  Abdomen:  Soft, nontender  Extremities:  No peripheral edema.  Neurologic: Nonfocal, moving all four extremities  Skin: No lesions  Access: Rt chest permcath    Basic Metabolic Panel: Recent Labs  Lab 08/14/22 1447 08/14/22 2014 08/15/22 0409  NA 132*  --  137  K 3.8  --  3.4*  CL 99  --  104  CO2 23  --  25  GLUCOSE 590*  --  158*  BUN 36*  --  38*  CREATININE 4.78* 4.94* 5.11*  CALCIUM 8.6*  --  8.9  PHOS  --   --  4.3    Liver Function Tests: Recent Labs  Lab 08/14/22 1447 08/15/22 0409  AST 47*  --   ALT 74*  --   ALKPHOS 89  --   BILITOT 1.2  --   PROT 6.0*  --   ALBUMIN 3.4* 3.2*   Recent Labs  Lab 08/14/22 1447  LIPASE 41   No results for input(s): "AMMONIA" in the last 168 hours.  CBC: Recent Labs  Lab 08/14/22 1447 08/14/22 2014 08/15/22 0409  WBC 4.1 4.3 4.1  NEUTROABS 2.9  --   --   HGB 8.9* 9.4* 8.9*  HCT 26.0* 27.2* 26.0*  MCV 88.7 88.3 88.1  PLT 123* 129* 128*  Cardiac Enzymes: No results for input(s): "CKTOTAL", "CKMB", "CKMBINDEX", "TROPONINI" in the last 168 hours.  BNP: Invalid input(s): "POCBNP"  CBG: Recent Labs  Lab 08/14/22 2142 08/15/22 0832  GLUCAP 281* 110*    Microbiology: Results for orders placed or performed during the hospital encounter of 08/14/22  Resp panel by RT-PCR (RSV, Flu A&B, Covid) Anterior Nasal Swab     Status: None   Collection Time: 08/14/22  4:30 PM   Specimen: Anterior Nasal Swab  Result Value Ref Range Status   SARS Coronavirus 2 by RT PCR NEGATIVE NEGATIVE Final    Comment: (NOTE) SARS-CoV-2 target nucleic acids are NOT DETECTED.  The SARS-CoV-2 RNA is generally  detectable in upper respiratory specimens during the acute phase of infection. The lowest concentration of SARS-CoV-2 viral copies this assay can detect is 138 copies/mL. A negative result does not preclude SARS-Cov-2 infection and should not be used as the sole basis for treatment or other patient management decisions. A negative result may occur with  improper specimen collection/handling, submission of specimen other than nasopharyngeal swab, presence of viral mutation(s) within the areas targeted by this assay, and inadequate number of viral copies(<138 copies/mL). A negative result must be combined with clinical observations, patient history, and epidemiological information. The expected result is Negative.  Fact Sheet for Patients:  EntrepreneurPulse.com.au  Fact Sheet for Healthcare Providers:  IncredibleEmployment.be  This test is no t yet approved or cleared by the [place] FDA and  has been authorized for detection and/or diagnosis of SARS-CoV-2 by FDA under an Emergency Use Authorization (EUA). This EUA will remain  in effect (meaning this test can be used) for the duration of the COVID-19 declaration under Section 564(b)(1) of the Act, 21 U.S.C.section 360bbb-3(b)(1), unless the authorization is terminated  or revoked sooner.       Influenza A by PCR NEGATIVE NEGATIVE Final   Influenza B by PCR NEGATIVE NEGATIVE Final    Comment: (NOTE) The Xpert Xpress SARS-CoV-2/FLU/RSV plus assay is intended as an aid in the diagnosis of influenza from Nasopharyngeal swab specimens and should not be used as a sole basis for treatment. Nasal washings and aspirates are unacceptable for Xpert Xpress SARS-CoV-2/FLU/RSV testing.  Fact Sheet for Patients: EntrepreneurPulse.com.au  Fact Sheet for Healthcare Providers: IncredibleEmployment.be  This test is not yet approved or cleared by the [place] FDA  and has been authorized for detection and/or diagnosis of SARS-CoV-2 by FDA under an Emergency Use Authorization (EUA). This EUA will remain in effect (meaning this test can be used) for the duration of the COVID-19 declaration under Section 564(b)(1) of the Act, 21 U.S.C. section 360bbb-3(b)(1), unless the authorization is terminated or revoked.     Resp Syncytial Virus by PCR NEGATIVE NEGATIVE Final    Comment: (NOTE) Fact Sheet for Patients: EntrepreneurPulse.com.au  Fact Sheet for Healthcare Providers: IncredibleEmployment.be  This test is not yet approved or cleared by the [place] FDA and has been authorized for detection and/or diagnosis of SARS-CoV-2 by FDA under an Emergency Use Authorization (EUA). This EUA will remain in effect (meaning this test can be used) for the duration of the COVID-19 declaration under Section 564(b)(1) of the Act, 21 U.S.C. section 360bbb-3(b)(1), unless the authorization is terminated or revoked.  Performed at [facility], [address] [place],  [address]     Coagulation Studies: No results for input(s): "LABPROT", "INR" in the last 72 hours.  Urinalysis: No results for input(s): "COLORURINE", "LABSPEC", "PHURINE", "GLUCOSEU", "HGBUR", "BILIRUBINUR", "KETONESUR", "PROTEINUR", "  UROBILINOGEN", "NITRITE", "LEUKOCYTESUR" in the last 72 hours.  Invalid input(s): "APPERANCEUR"    Imaging: DG Chest 2 View  Result Date: 08/14/2022 CLINICAL DATA:  Shortness of breath EXAM: CHEST - 2 VIEW COMPARISON:  X-ray 07/10/2022 and older FINDINGS: Underinflation. Small bilateral pleural effusions with some adjacent opacities. Atelectasis versus infiltrate. No pneumothorax. Slight vascular congestion. No edema today. Stable cardiopericardial silhouette. Degenerative changes of the spine. Stable double lumen right IJ catheter. IMPRESSION: Underinflation with developing bilateral small effusions and  adjacent opacities. Atelectasis versus infiltrate. Recommend follow-up. Right IJ catheter. Electronically Signed   By: Jill Side M.D.   On: 08/14/2022 15:33     Medications:    sodium chloride      aspirin EC  81 mg Oral Daily   carvedilol  25 mg Oral BID   Chlorhexidine Gluconate Cloth  6 each Topical Q0600   clopidogrel  75 mg Oral Daily   ezetimibe  10 mg Oral Daily   feeding supplement (NEPRO CARB STEADY)  237 mL Oral TID BM   furosemide  80 mg Intravenous BID   heparin  5,000 Units Subcutaneous Q8H   hydrALAZINE  100 mg Oral Q8H   insulin aspart  0-5 Units Subcutaneous QHS   insulin aspart  0-9 Units Subcutaneous TID WC   insulin detemir  15 Units Subcutaneous QHS   isosorbide mononitrate  30 mg Oral BID   losartan  100 mg Oral Daily   pantoprazole  20 mg Oral Daily   rosuvastatin  10 mg Oral Daily   sodium chloride flush  3 mL Intravenous Q12H   sodium chloride, acetaminophen, albuterol, guaiFENesin-dextromethorphan, hydrALAZINE, nitroGLYCERIN, ondansetron (ZOFRAN) IV, sodium chloride flush  Assessment/ Plan:  Jimmy Olson is a 62 y.o.  male with past medical history including diabetes, heart failure, CAD, and end stage renal disease on hemodialysis. Patient presents with shortness of breath and has been admitted for Orthopnea [R06.01] Acute pulmonary edema ([place]) [J81.0] Exertional dyspnea [R06.09] Hyperglycemia [R73.9] End stage renal disease on dialysis ([place]) [N18.6, Z99.2] Fluid overload [E87.70]  [facility] N /TTS/Rt chest permcath  End stage renal disease on hemodialysis. Last treatment received on Saturday. Receiving treatment today, UF goal 1.5L as tolerated. Next treatment scheduled for Thursday.   2. Anemia of chronic kidney disease Lab Results  Component Value Date   HGB 8.9 (L) 08/15/2022    Hgb just below desired target. Patient receives Mircera at outpatient clinic.   3. Secondary Hyperparathyroidism: with outpatient labs: PTH 376,  phosphorus 3.6, calcium 8.4 on 08/01/22.   Lab Results  Component Value Date   CALCIUM 8.9 08/15/2022   CAION 1.19 07/13/2022   PHOS 4.3 08/15/2022    Will continue to monitor bone minerals during this admission.   4. Hypertension with chronic kidney disease. Home regimen includes carvedilol, ezetimibe, hydralazine, isosorbide, losartan and torsemide. Receiving these medications, Furosemide in place of Torsemide.   5. Diabetes mellitus type II with chronic kidney disease/renal manifestations: insulin dependent. Home regimen includes Levemir and novolog. Most recent hemoglobin A1c is 10.1 on 05/17/22.     LOS: 1   2/27/202411:57 AM

## 2022-08-15 NOTE — Progress Notes (Signed)
Received patient in bed to unit.  Alert and oriented.  Informed consent signed and in chart.   TX duration: 3.5 hr  Patient tolerated well.  Transported back to the room  Alert, without acute distress.  Hand-off given to patient's nurse.   Access used: cvc Access issues: none  Total UF removed: 1500 ml Medication(s) given: none Post HD VS: stable   [facility]

## 2022-08-16 ENCOUNTER — Other Ambulatory Visit: Payer: Self-pay

## 2022-08-16 DIAGNOSIS — I160 Hypertensive urgency: Secondary | ICD-10-CM | POA: Diagnosis not present

## 2022-08-16 DIAGNOSIS — I509 Heart failure, unspecified: Secondary | ICD-10-CM | POA: Diagnosis not present

## 2022-08-16 LAB — CBC
HCT: 28.3 % — ABNORMAL LOW (ref 39.0–52.0)
Hemoglobin: 9.7 g/dL — ABNORMAL LOW (ref 13.0–17.0)
MCH: 30 pg (ref 26.0–34.0)
MCHC: 34.3 g/dL (ref 30.0–36.0)
MCV: 87.6 fL (ref 80.0–100.0)
Platelets: 128 10*3/uL — ABNORMAL LOW (ref 150–400)
RBC: 3.23 MIL/uL — ABNORMAL LOW (ref 4.22–5.81)
RDW: 13 % (ref 11.5–15.5)
WBC: 3.6 10*3/uL — ABNORMAL LOW (ref 4.0–10.5)
nRBC: 0 % (ref 0.0–0.2)

## 2022-08-16 LAB — GLUCOSE, CAPILLARY: Glucose-Capillary: 74 mg/dL (ref 70–99)

## 2022-08-16 LAB — BASIC METABOLIC PANEL
Anion gap: 8 (ref 5–15)
BUN: 25 mg/dL — ABNORMAL HIGH (ref 6–20)
CO2: 27 mmol/L (ref 22–32)
Calcium: 8.5 mg/dL — ABNORMAL LOW (ref 8.9–10.3)
Chloride: 99 mmol/L (ref 98–111)
Creatinine, Ser: 3.64 mg/dL — ABNORMAL HIGH (ref 0.61–1.24)
GFR, Estimated: 18 mL/min — ABNORMAL LOW (ref >60)
Glucose, Bld: 227 mg/dL — ABNORMAL HIGH (ref 70–99)
Potassium: 3.6 mmol/L (ref 3.5–5.1)
Sodium: 134 mmol/L — ABNORMAL LOW (ref 135–145)

## 2022-08-16 LAB — HEPATITIS B SURFACE ANTIBODY, QUANTITATIVE: Hep B S AB Quant (Post): <3.1 m[IU]/mL — ABNORMAL LOW (ref >9.9)

## 2022-08-16 MED ORDER — LOSARTAN POTASSIUM 100 MG PO TABS
100.0000 mg | ORAL_TABLET | Freq: Every day | ORAL | 2 refills | Status: DC
  Filled 2022-08-16: qty 30, 30d supply, fill #0

## 2022-08-16 MED ORDER — CARVEDILOL 25 MG PO TABS
25.0000 mg | ORAL_TABLET | Freq: Two times a day (BID) | ORAL | 2 refills | Status: DC
  Filled 2022-08-16: qty 60, 30d supply, fill #0

## 2022-08-16 MED ORDER — AMLODIPINE BESYLATE 5 MG PO TABS
5.0000 mg | ORAL_TABLET | Freq: Every day | ORAL | 2 refills | Status: DC
  Filled 2022-08-16: qty 30, 30d supply, fill #0

## 2022-08-16 MED ORDER — AMLODIPINE BESYLATE 5 MG PO TABS
5.0000 mg | ORAL_TABLET | Freq: Every day | ORAL | Status: DC
  Administered 2022-08-16: 5 mg via ORAL
  Filled 2022-08-16: qty 1

## 2022-08-16 MED ORDER — CLOPIDOGREL BISULFATE 75 MG PO TABS
75.0000 mg | ORAL_TABLET | Freq: Every day | ORAL | 2 refills | Status: DC
  Filled 2022-08-16: qty 30, 30d supply, fill #0

## 2022-08-16 MED ORDER — HYDRALAZINE HCL 100 MG PO TABS
100.0000 mg | ORAL_TABLET | Freq: Three times a day (TID) | ORAL | 2 refills | Status: DC
  Filled 2022-08-16: qty 90, 30d supply, fill #0

## 2022-08-16 MED ORDER — TORSEMIDE 20 MG PO TABS
20.0000 mg | ORAL_TABLET | ORAL | 2 refills | Status: DC
  Filled 2022-08-16: qty 12, 28d supply, fill #0

## 2022-08-16 NOTE — Consult Note (Signed)
ADVANCED HEART FAILURE CONSULT NOTE  Referring Physician: No ref. provider found  Primary Care: [facility] [place] Primary Cardiologist:  HPI: Jimmy Olson is a 62 y.o. male with coronary artery disease status post PCI to the mid LAD in December 2023, heart failure with reduced EF (EF 40 to 45%), type 2 diabetes, uncontrolled hypertension, hyperlipidemia, history of TIA, history of DVT and ESRD on hemodialysis currently admitted to [facility] with complaints of progressively worsening shortness of breath over the past several days.  Over the past 1 year, Jimmy Olson has had several admissions for hypertensive emergency and shortness of breat with blood pressures reaching as high as 210/106.  According to Jimmy Olson, he has been compliant with all medications and hemodialysis.  Over the past 3 to 4 days he has noticed worsening shortness of breath until last night he can no longer sleep due to severe dyspnea.  He came to [facility] emergency department where chest x-ray was significant for pleural effusion that is now status post thoracentesis.  In addition he was hypertensive to 199/106 and hypervolemic on exam.  He is also undergone 1 session of dialysis and now reports complete resolution of symptoms.  Interval hx:  Reports feeling back to baseline.  States that his shortness of breath is now completely resolved.  Laying flat in bed and sleeping.   PHYSICAL EXAM: Vitals:   08/15/22 2350 08/16/22 0316  BP: 138/72 (!) 141/82  Pulse: 83 75  Resp: 16 18  Temp: 98.2 F (36.8 C) 98.1 F (36.7 C)  SpO2: 98% 100%   GENERAL: Laying flat in bed, no apparent distress. NECK: Supple, No masses. Normal carotid upstrokes without bruits. No masses or thyromegaly.    CHEST: There are no chest wall deformities. There is no chest wall tenderness. Respirations are unlabored.  Lungs-CTA bilaterally CARDIAC:  JVP: 10 cm H2O         Normal S1, S2  Normal rate with regular rhythm. No murmurs,  rubs or gallops.  Pulses are 2+ and symmetrical in upper and lower extremities. 1+ edema.  ABDOMEN: Soft, non-tender, non-distended. There are no masses or hepatomegaly. There are normal bowel sounds.  EXTREMITIES: Warm and well perfused with no cyanosis, clubbing.  LYMPHATIC: No axillary or supraclavicular lymphadenopathy.  NEUROLOGIC: Patient is oriented x3 with no focal or lateralizing neurologic deficits.  PSYCH: Patients affect is appropriate, there is no evidence of anxiety or depression.  SKIN: Warm and dry; no lesions or wounds.   DATA REVIEW  ECG: Normal sinus rhythm  ECHO: 07/11/2022 LVEF 35 to 40%, normal RV function.  No significant mitral regurgitation.  CATH: 07/13/2022 Ost LAD to Prox LAD lesion is 50% stenosed.  Prox LAD lesion is 40% stenosed.   Prox LAD to Mid LAD stent is widely patent with minimal-5% stenosed.   1st Diag lesion is 45% stenosed.   Prox RCA lesion is 20% stenosed.  Mid RCA lesion is 30% stenosed.  Dist RCA lesion is 40% stenosed.  RPDA lesion is 40% stenosed.   LV end diastolic pressure is severely elevated.   There is no aortic valve stenosis.   Post-Cath Diagnoses Widely patent LAD stent with otherwise minimal CAD from last catheterization Severely elevated LVEDP of 28 mmHg indicating pressure overload (acute On Chronic Combined Systolic and Diastolic Heart Failure). Suspect this is the etiology for elevated troponin levels. Would not call this a non-ST elevation myocardial infarction.    ASSESSMENT & PLAN:  Acute on chronic heart failure  with reduced EF exacerbation -Likely secondary to inadequate volume removal during dialysis and uncontrolled hypertension.  Patient reports compliance with all medications and states that he has not missed any dialysis sessions.   -Continue Coreg 25 mg twice daily, Plavix 75 mg daily, Lasix 80 IV twice daily, hydralazine 100 3 times daily.  -Blood pressure 0000000 systolic this morning.  Will start amlodipine 5 mg  daily. -Consider repeat dialysis session and increased volume removal during outpatient sessions. -Heart failure will now sign off.   2.  Hypertensive urgency -Blood pressure well-controlled now.   [facility] Walle Advanced Heart Failure Mechanical Circulatory Support

## 2022-08-16 NOTE — Progress Notes (Signed)
Transition of Care [facility]) - Progression Note    Patient Details  Name: Jimmy Olson MRN: [id number] Date of Birth: 1960/10/17  Transition of Care [facility]) CM/SW Contact  Laurena Slimmer, RN Phone Number: 08/16/2022, 12:22 PM  Clinical Narrative:    Patient has CHF and generalized weakness which requires his upper torso  be positioned in ways not feasible with a normal bed. Head must be elevated 35 degrees or shortness of breath and respiratory distress may occur. CHF frequently and upon awakening requires frequent changes in body position which cannot be achieved with a normal bed.         Expected Discharge Plan and Services         Expected Discharge Date: 08/16/22                                     Social Determinants of Health (SDOH) Interventions SDOH Screenings   Food Insecurity: No Food Insecurity (08/15/2022)  Housing: Low Risk  (08/15/2022)  Transportation Needs: No Transportation Needs (08/15/2022)  Utilities: Not At Risk (08/15/2022)  Depression (PHQ2-9): Low Risk  (11/29/2021)  Tobacco Use: Low Risk  (08/15/2022)    Readmission Risk Interventions    10/03/2021    2:32 PM 08/08/2021    8:49 AM 06/01/2021    2:05 PM  Readmission Risk Prevention Plan  Transportation Screening Complete Complete Complete  PCP or Specialist Appt within 3-5 Days Complete Complete Complete  HRI or Home Care Consult  Complete Complete  Social Work Consult for [place] Planning/Counseling Complete Complete Complete  Palliative Care Screening Not Applicable Not Applicable Not Applicable  Medication Review Press photographer) Complete Complete Referral to Pharmacy

## 2022-08-16 NOTE — Discharge Summary (Signed)
Physician Discharge Summary   GENERO SUMMERSETT  male DOB: 09-17-1960  [id number]  PCP: [facility] [place] date: 08/14/2022 Discharge date: 08/16/2022  Admitted From: home Disposition:  home CODE STATUS: Full code   Hospital Course:  For full details, please see H&P, progress notes, consult notes and ancillary notes.  Briefly,  Jimmy Olson is a 62 y.o. male with medical history of end-stage renal hemodialysis on Tuesday Thursday Saturday (reportedly compliant), heart failure, insulin-dependent type 2 diabetic, CAD, who presented to the emergency department with orthopnea exertional dyspnea worsening over the past day, also noted consistent trouble breathing at night.   #Acute hypoxic respiratory failure, ruled out --no documentation of hypoxia.  Pt sating well on room air prior to discharge.  #Pleural effusions  s/p R Thoracentesis 08/15/22 800 cc clear yellow fluid   Acute on chronic sCHF exacerbation -Likely secondary to inadequate volume removal during dialysis and uncontrolled hypertension.  Patient reported compliance with all medications and stated that he has not missed any dialysis sessions.   --recent echocardiogram 07/11/22 about 1 month ago - EF 35-40%, moderately decreased fxn LV, global hypokinesis, G1DD.  --received extra dialysis session with improvement of his dyspnea.   --cont coreg, hydralazine, Imdur, losartan --cont torsemide on non-HD days  #Hypertensive urgency  --BP elevated, so amlodipine added --cont coreg, hydralazine, Imdur, losartan --cont torsemide on non-HD days  #CAD --cont ASA, plavix, Zetia and Crestor  #Demand ischemia --trop 400's x3, flat.  #ESRD on hemodialysis ([place]) #Fluid overload --pt received extra HD session during hospitalization   #Uncontrolled type 2 diabetes mellitus with hyperglycemia, with long-term current use of insulin  Insulin non-compliance --hyperglycemic d/t didn't take insulin for 2  days PTA.  Recent A1c 10.1 --discharged on home insulin regimen as below.   #HLD (hyperlipidemia) --cont statin   # Anemia of chronic disease   #Pseudohyponatremia d/t hyperglycemia   Discharge Diagnoses:  Principal Problem:   Pleural effusion due to CHF (congestive heart failure) (HCC) Active Problems:   Acute on chronic HFrEF (heart failure with reduced ejection fraction) (HCC)   Hypertensive urgency   HFrEF (heart failure with reduced ejection fraction) (HCC)   Chest pain   Coronary artery disease of native artery of native heart with stable angina pectoris (HCC)   CAD S/P percutaneous coronary angioplasty   ESRD on hemodialysis ([place])   Uncontrolled type 2 diabetes mellitus with hyperglycemia, with long-term current use of insulin (HCC)   HLD (hyperlipidemia)   Demand ischemia   Hyperlipidemia associated with type 2 diabetes mellitus (HCC)   Shortness of breath   History of anemia due to CKD   Depression   Acute respiratory failure with hypoxia (HCC)   Ischemic cardiomyopathy   Pseudohyponatremia   Fluid overload   30 Day Unplanned Readmission Risk Score    Flowsheet Row ED to Hosp-Admission (Current) from 08/14/2022 in [place] [facility] PCU  30 Day Unplanned Readmission Risk Score (%) 45.25 Filed at 08/16/2022 0801       This score is the patient's risk of an unplanned readmission within 30 days of being discharged (0 -100%). The score is based on dignosis, age, lab data, medications, orders, and past utilization.   Low:  0-14.9   Medium: 15-21.9   High: 22-29.9   Extreme: 30 and above         Discharge Instructions:  Allergies as of 08/16/2022   No Known Allergies      Medication List  TAKE these medications    acetaminophen 325 MG tablet Commonly known as: TYLENOL Take 650 mg by mouth every 6 (six) hours as needed.   albuterol 108 (90 Base) MCG/ACT inhaler Commonly known as: Proventil HFA Inhale 2 puffs into the lungs every 4  (four) hours as needed for wheezing or shortness of breath.   amLODipine 5 MG tablet Commonly known as: NORVASC Tome 1 tableta (5 mg en total) por va oral diariamente. (Take 1 tablet (5 mg total) by mouth daily.)   aspirin EC 81 MG tablet Take 1 tablet (81 mg total) by mouth daily. Swallow whole.   carvedilol 25 MG tablet Commonly known as: COREG Tome 1 tableta (25 mg en total) por va oral 2 (dos) veces al [employer]. (Take 1 tablet (25 mg total) by mouth 2 (two) times daily.)   clopidogrel 75 MG tablet Commonly known as: PLAVIX Tome 1 tableta (75 mg en total) por va oral diariamente. (Take 1 tablet (75 mg total) by mouth daily.)   escitalopram 10 MG tablet Commonly known as: LEXAPRO Tome 1 tableta (10 mg en total) por va oral antes de acostarse. (Take 1 tablet (10 mg total) by mouth at bedtime.)   ezetimibe 10 MG tablet Commonly known as: ZETIA Tome 1 tableta (10 mg en total) por va oral diariamente. (Take 1 tablet (10 mg total) by mouth daily.)   feeding supplement (NEPRO CARB STEADY) Liqd Take 237 mLs by mouth 3 (three) times daily between meals.   hydrALAZINE 100 MG tablet Commonly known as: APRESOLINE Take 1 tablet (100 mg total) by mouth 3 (three) times daily. What changed: when to take this   isosorbide mononitrate 30 MG 24 hr tablet Commonly known as: IMDUR Tome 1 tableta (30 mg en total) por va oral por la maana y al acostarse. (Take 1 tablet (30 mg total) by mouth in the morning and at bedtime.)   Levemir FlexPen 100 UNIT/ML FlexPen Generic drug: insulin detemir Inject 15 Units into the skin at bedtime. What changed:  how much to take when to take this   losartan 100 MG tablet Commonly known as: COZAAR Tome 1 tableta (100 mg en total) por va oral diariamente. (Take 1 tablet (100 mg total) by mouth daily.)   nitroGLYCERIN 0.4 MG SL tablet Commonly known as: NITROSTAT Place 1 tablet (0.4 mg total) under the tongue every 5 (five) minutes as needed for  chest pain.   NovoLOG FlexPen 100 UNIT/ML FlexPen Generic drug: insulin aspart Inject 0-15 Units into the skin 3 (three) times daily with meals. 0-15 units tid 0-5 units qhs   pantoprazole 20 MG tablet Commonly known as: PROTONIX Tome 1 tableta (20 mg en total) por va oral diariamente. (Take 1 tablet (20 mg total) by mouth daily.)   rosuvastatin 10 MG tablet Commonly known as: CRESTOR Tome 1 tableta (10 mg en total) por va oral diariamente. (Take 1 tablet (10 mg total) by mouth daily.)   torsemide 20 MG tablet Commonly known as: DEMADEX Take 1 tablet (20 mg total) by mouth every Monday, Wednesday, and Friday. On non-dialysis days. What changed:  when to take this additional instructions               Durable Medical Equipment  (From admission, onward)           Start     Ordered   08/16/22 0920  For home use only DME Hospital bed  Once       Question Answer Comment  Length of Need Lifetime   Bed type Semi-electric      08/16/22 0919             Follow-up Information     Gerrie Nordmann, NP. Go in 1 week(s).   Specialty: Cardiology Why: Please go to your appointment on Wednesday, 09/27/2022. You have been added to a waitlist for an earlier appointment. Contact information: [address], [place] [address] [phone]                 No Known Allergies   The results of significant diagnostics from this hospitalization (including imaging, microbiology, ancillary and laboratory) are listed below for reference.   Consultations:   Procedures/Studies: US THORACENTESIS ASP PLEURAL SPACE W/IMG GUIDE  Result Date: 08/15/2022 INDICATION: History of DM type 2, CHF, CAD and ESRD on HD. Patient admitted for DOE and orthopnea found to have pleural effusion. Patient received for therapeutic thoracentesis. EXAM: ULTRASOUND GUIDED THERAPEUTIC RIGHT THORACENTESIS MEDICATIONS: 10 mL 1 % lidocaine COMPLICATIONS: None immediate.  PROCEDURE: An ultrasound guided thoracentesis was thoroughly discussed with the patient and questions answered. The benefits, risks, alternatives and complications were also discussed. The patient understands and wishes to proceed with the procedure. Written consent was obtained. Ultrasound was performed to localize and mark an adequate pocket of fluid in the right chest. The area was then prepped and draped in the normal sterile fashion. 1% Lidocaine was used for local anesthesia. Under ultrasound guidance a 6 Fr Safe-T-Centesis catheter was introduced. Thoracentesis was performed. The catheter was removed and a dressing applied. FINDINGS: A total of approximately 800 cc of clear, yellow fluid was removed. IMPRESSION: Successful ultrasound guided right thoracentesis yielding 800 cc of pleural fluid. Read by: Narda Rutherford, AGNP-BC Electronically Signed   By: Ruthann Cancer M.D.   On: 08/15/2022 15:46   DG Chest Port 1 View  Result Date: 08/15/2022 CLINICAL DATA:  Status post right thoracentesis. EXAM: PORTABLE CHEST 1 VIEW COMPARISON:  August 14, 2022. FINDINGS: No pneumothorax status post right thoracentesis. No significant residual pleural effusion is noted. IMPRESSION: No pneumothorax status post right thoracentesis. Electronically Signed   By: Marijo Conception M.D.   On: 08/15/2022 15:11   DG Chest 2 View  Result Date: 08/14/2022 CLINICAL DATA:  Shortness of breath EXAM: CHEST - 2 VIEW COMPARISON:  X-ray 07/10/2022 and older FINDINGS: Underinflation. Small bilateral pleural effusions with some adjacent opacities. Atelectasis versus infiltrate. No pneumothorax. Slight vascular congestion. No edema today. Stable cardiopericardial silhouette. Degenerative changes of the spine. Stable double lumen right IJ catheter. IMPRESSION: Underinflation with developing bilateral small effusions and adjacent opacities. Atelectasis versus infiltrate. Recommend follow-up. Right IJ catheter. Electronically Signed   By:  Jill Side M.D.   On: 08/14/2022 15:33      Labs: BNP (last 3 results) Recent Labs    07/10/22 0404 08/14/22 1447 08/15/22 0409  BNP >4,500.0* >4,500.0* [id number]*   Basic Metabolic Panel: Recent Labs  Lab 08/14/22 1447 08/14/22 2014 08/15/22 0409 08/16/22 0359  NA 132*  --  137 134*  K 3.8  --  3.4* 3.6  CL 99  --  104 99  CO2 23  --  25 27  GLUCOSE 590*  --  158* 227*  BUN 36*  --  38* 25*  CREATININE 4.78* 4.94* 5.11* 3.64*  CALCIUM 8.6*  --  8.9 8.5*  PHOS  --   --  4.3  --    Liver Function Tests: Recent Labs  Lab  08/14/22 1447 08/15/22 0409  AST 47*  --   ALT 74*  --   ALKPHOS 89  --   BILITOT 1.2  --   PROT 6.0*  --   ALBUMIN 3.4* 3.2*   Recent Labs  Lab 08/14/22 1447  LIPASE 41   No results for input(s): "AMMONIA" in the last 168 hours. CBC: Recent Labs  Lab 08/14/22 1447 08/14/22 2014 08/15/22 0409 08/16/22 0359  WBC 4.1 4.3 4.1 3.6*  NEUTROABS 2.9  --   --   --   HGB 8.9* 9.4* 8.9* 9.7*  HCT 26.0* 27.2* 26.0* 28.3*  MCV 88.7 88.3 88.1 87.6  PLT 123* 129* 128* 128*   Cardiac Enzymes: No results for input(s): "CKTOTAL", "CKMB", "CKMBINDEX", "TROPONINI" in the last 168 hours. BNP: Invalid input(s): "POCBNP" CBG: Recent Labs  Lab 08/14/22 2142 08/15/22 0832 08/15/22 1514 08/15/22 2023 08/16/22 0851  GLUCAP 281* 110* 110* 294* 74   D-Dimer No results for input(s): "DDIMER" in the last 72 hours. Hgb A1c No results for input(s): "HGBA1C" in the last 72 hours. Lipid Profile No results for input(s): "CHOL", "HDL", "LDLCALC", "TRIG", "CHOLHDL", "LDLDIRECT" in the last 72 hours. Thyroid function studies No results for input(s): "TSH", "T4TOTAL", "T3FREE", "THYROIDAB" in the last 72 hours.  Invalid input(s): "FREET3" Anemia work up No results for input(s): "VITAMINB12", "FOLATE", "FERRITIN", "TIBC", "IRON", "RETICCTPCT" in the last 72 hours. Urinalysis    Component Value Date/Time   COLORURINE STRAW (A) 07/11/2022 0127    APPEARANCEUR HAZY (A) 07/11/2022 0127   LABSPEC 1.009 07/11/2022 0127   PHURINE 7.0 07/11/2022 0127   GLUCOSEU >=500 (A) 07/11/2022 0127   HGBUR SMALL (A) 07/11/2022 0127   BILIRUBINUR NEGATIVE 07/11/2022 0127   KETONESUR NEGATIVE 07/11/2022 0127   PROTEINUR >=300 (A) 07/11/2022 0127   NITRITE NEGATIVE 07/11/2022 0127   LEUKOCYTESUR NEGATIVE 07/11/2022 0127   Sepsis Labs Recent Labs  Lab 08/14/22 1447 08/14/22 2014 08/15/22 0409 08/16/22 0359  WBC 4.1 4.3 4.1 3.6*   Microbiology Recent Results (from the past 240 hour(s))  Resp panel by RT-PCR (RSV, Flu A&B, Covid) Anterior Nasal Swab     Status: None   Collection Time: 08/14/22  4:30 PM   Specimen: Anterior Nasal Swab  Result Value Ref Range Status   SARS Coronavirus 2 by RT PCR NEGATIVE NEGATIVE Final    Comment: (NOTE) SARS-CoV-2 target nucleic acids are NOT DETECTED.  The SARS-CoV-2 RNA is generally detectable in upper respiratory specimens during the acute phase of infection. The lowest concentration of SARS-CoV-2 viral copies this assay can detect is 138 copies/mL. A negative result does not preclude SARS-Cov-2 infection and should not be used as the sole basis for treatment or other patient management decisions. A negative result may occur with  improper specimen collection/handling, submission of specimen other than nasopharyngeal swab, presence of viral mutation(s) within the areas targeted by this assay, and inadequate number of viral copies(<138 copies/mL). A negative result must be combined with clinical observations, patient history, and epidemiological information. The expected result is Negative.  Fact Sheet for Patients:  EntrepreneurPulse.com.au  Fact Sheet for Healthcare Providers:  IncredibleEmployment.be  This test is no t yet approved or cleared by the [place] FDA and  has been authorized for detection and/or diagnosis of SARS-CoV-2 by FDA under an  Emergency Use Authorization (EUA). This EUA will remain  in effect (meaning this test can be used) for the duration of the COVID-19 declaration under Section 564(b)(1) of the Act, 21 U.S.C.section 360bbb-3(b)(1), unless  the authorization is terminated  or revoked sooner.       Influenza A by PCR NEGATIVE NEGATIVE Final   Influenza B by PCR NEGATIVE NEGATIVE Final    Comment: (NOTE) The Xpert Xpress SARS-CoV-2/FLU/RSV plus assay is intended as an aid in the diagnosis of influenza from Nasopharyngeal swab specimens and should not be used as a sole basis for treatment. Nasal washings and aspirates are unacceptable for Xpert Xpress SARS-CoV-2/FLU/RSV testing.  Fact Sheet for Patients: EntrepreneurPulse.com.au  Fact Sheet for Healthcare Providers: IncredibleEmployment.be  This test is not yet approved or cleared by the [place] FDA and has been authorized for detection and/or diagnosis of SARS-CoV-2 by FDA under an Emergency Use Authorization (EUA). This EUA will remain in effect (meaning this test can be used) for the duration of the COVID-19 declaration under Section 564(b)(1) of the Act, 21 U.S.C. section 360bbb-3(b)(1), unless the authorization is terminated or revoked.     Resp Syncytial Virus by PCR NEGATIVE NEGATIVE Final    Comment: (NOTE) Fact Sheet for Patients: EntrepreneurPulse.com.au  Fact Sheet for Healthcare Providers: IncredibleEmployment.be  This test is not yet approved or cleared by the [place] FDA and has been authorized for detection and/or diagnosis of SARS-CoV-2 by FDA under an Emergency Use Authorization (EUA). This EUA will remain in effect (meaning this test can be used) for the duration of the COVID-19 declaration under Section 564(b)(1) of the Act, 21 U.S.C. section 360bbb-3(b)(1), unless the authorization is terminated or revoked.  Performed at [facility], [address] [place], [place] [address]   Body fluid culture w Gram Stain     Status: None (Preliminary result)   Collection Time: 08/15/22  2:45 PM   Specimen: PATH Cytology Pleural fluid  Result Value Ref Range Status   Specimen Description   Final    PLEURAL Performed at [facility], [address], [place], [place] [address]    Special Requests   Final    PLEURAL Performed at [facility], [address] [place], [place] [address]    Gram Stain   Final    WBC PRESENT, PREDOMINANTLY MONONUCLEAR NO ORGANISMS SEEN    Culture   Final    NO GROWTH < 24 HOURS Performed at [facility], [place] [address] [place], [place] [address]    Report Status PENDING  Incomplete     Total time spend on discharging this patient, including the last patient exam, discussing the hospital stay, instructions for ongoing care as it relates to all pertinent caregivers, as well as preparing the medical discharge records, prescriptions, and/or referrals as applicable, is 45 minutes.    Enzo Bi, MD  [facility] 08/16/2022, 3:21 PM

## 2022-08-16 NOTE — Progress Notes (Signed)
  Received patient in bed to unit.   Informed consent signed and in chart.    [place] duration:2.5HRS  UF only     Transported back to  unit Hand-off given to patient's nurse.  No c/o  no distress noted    Access used: R catheter Access issues: none   Total UF removed: 2.0L Medication(s) given: none Post HD VS: stable upon discharge from clinic Post HD weight: 61.2 kg     Darrol Jump LPN [facility]

## 2022-08-16 NOTE — TOC Transition Note (Signed)
Transition of Care [facility]) - CM/SW Discharge Note   Patient Details  Name: [name] MRN: [id number] Date of Birth: 04-02-1961  [facility]) CM/SW Contact:  Laurena Slimmer, RN Phone Number: 08/16/2022, 4:05 PM   Clinical Narrative:    Spoke with patient's wife, Ana with LL interpretor [employer]. Patient wife advised a hospital bed was ordered. She stated she had already been contacted by [employer] and the bed would be delivered tomorrow. They were advised they would need to make room for the bed to ensure delivery.   Patient already in transit home  Colby signing off          Patient Goals and CMS Choice      Discharge Placement                         Discharge Plan and Services Additional resources added to the After Visit Summary for                                       Social Determinants of Health (SDOH) Interventions SDOH Screenings   Food Insecurity: No Food Insecurity (08/15/2022)  Housing: Low Risk  (08/15/2022)  Transportation Needs: No Transportation Needs (08/15/2022)  Utilities: Not At Risk (08/15/2022)  Depression (PHQ2-9): Low Risk  (11/29/2021)  Tobacco Use: Low Risk  (08/15/2022)     Readmission Risk Interventions    10/03/2021    2:32 PM 08/08/2021    8:49 AM 06/01/2021    2:05 PM  Readmission Risk Prevention Plan  Transportation Screening Complete Complete Complete  PCP or Specialist Appt within 3-5 Days Complete Complete Complete  HRI or Home Care Consult  Complete Complete  Social Work Consult for [place] Planning/Counseling Complete Complete Complete  Palliative Care Screening Not Applicable Not Applicable Not Applicable  Medication Review Press photographer) Complete Complete Referral to Pharmacy

## 2022-08-16 NOTE — Progress Notes (Signed)
[facility]  ROUNDING NOTE   Subjective:   Jimmy Olson is a 62 y.o. male with past medical history including diabetes, heart failure, CAD, and end stage renal disease on hemodialysis. Patient presents with shortness of breath and has been admitted for Orthopnea [R06.01] Acute pulmonary edema ([place]) [J81.0] Pleural effusion [J90] Exertional dyspnea [R06.09] Hyperglycemia [R73.9] End stage renal disease on dialysis ([place]) [N18.6, Z99.2] S/P thoracentesis [Z98.890] Fluid overload [E87.70]  Patient is known to our practice and receives outpatient dialysis treatments at [facility] on a TTS schedule.   Patient seen and evaluated during dialysis   HEMODIALYSIS FLOWSHEET:  Blood Flow Rate (mL/min): 400 mL/min Arterial Pressure (mmHg): -160 mmHg Venous Pressure (mmHg): 160 mmHg TMP (mmHg): -17 mmHg Ultrafiltration Rate (mL/min): 1079 mL/min Dialysate Flow Rate (mL/min): 300 ml/min Dialysis Fluid Bolus: Normal Saline Bolus Amount (mL): 300 mL  Patient reports improvement in shortness of breath    Objective:  Vital signs in last 24 hours:  Temp:  [97.5 F (36.4 C)-98.9 F (37.2 C)] 98.3 F (36.8 C) (02/28 1103) Pulse Rate:  [70-83] 78 (02/28 1103) Resp:  [12-25] 16 (02/28 1103) BP: (133-184)/(59-99) 133/59 (02/28 1103) SpO2:  [97 %-100 %] 97 % (02/28 1103) Weight:  [57.5 kg-66 kg] 62.1 kg (02/28 1109)  Weight change: -0.5 kg Filed Weights   08/15/22 1301 08/16/22 0500 08/16/22 1109  Weight: 66 kg 57.5 kg 62.1 kg    Intake/Output: I/O last 3 completed shifts: In: 480 [P.O.:480] Out: 2950 [Urine:1450; Other:1500]   Intake/Output this shift:  Total I/O In: 240 [P.O.:240] Out: -   Physical Exam: General: NAD  Head: Normocephalic, atraumatic. Moist oral mucosal membranes  Eyes: Anicteric  Lungs:  Clear to auscultation, normal effort  Heart: Regular rate and rhythm  Abdomen:  Soft, nontender  Extremities:  No peripheral edema.  Neurologic:  Nonfocal, moving all four extremities  Skin: No lesions  Access: Rt chest permcath    Basic Metabolic Panel: Recent Labs  Lab 08/14/22 1447 08/14/22 2014 08/15/22 0409 08/16/22 0359  NA 132*  --  137 134*  K 3.8  --  3.4* 3.6  CL 99  --  104 99  CO2 23  --  25 27  GLUCOSE 590*  --  158* 227*  BUN 36*  --  38* 25*  CREATININE 4.78* 4.94* 5.11* 3.64*  CALCIUM 8.6*  --  8.9 8.5*  PHOS  --   --  4.3  --      Liver Function Tests: Recent Labs  Lab 08/14/22 1447 08/15/22 0409  AST 47*  --   ALT 74*  --   ALKPHOS 89  --   BILITOT 1.2  --   PROT 6.0*  --   ALBUMIN 3.4* 3.2*    Recent Labs  Lab 08/14/22 1447  LIPASE 41    No results for input(s): "AMMONIA" in the last 168 hours.  CBC: Recent Labs  Lab 08/14/22 1447 08/14/22 2014 08/15/22 0409 08/16/22 0359  WBC 4.1 4.3 4.1 3.6*  NEUTROABS 2.9  --   --   --   HGB 8.9* 9.4* 8.9* 9.7*  HCT 26.0* 27.2* 26.0* 28.3*  MCV 88.7 88.3 88.1 87.6  PLT 123* 129* 128* 128*     Cardiac Enzymes: No results for input(s): "CKTOTAL", "CKMB", "CKMBINDEX", "TROPONINI" in the last 168 hours.  BNP: Invalid input(s): "POCBNP"  CBG: Recent Labs  Lab 08/14/22 2142 08/15/22 0832 08/15/22 1514 08/15/22 2023 08/16/22 0851  GLUCAP 281* 110* 110* 294*  74     Microbiology: Results for orders placed or performed during the hospital encounter of 08/14/22  Resp panel by RT-PCR (RSV, Flu A&B, Covid) Anterior Nasal Swab     Status: None   Collection Time: 08/14/22  4:30 PM   Specimen: Anterior Nasal Swab  Result Value Ref Range Status   SARS Coronavirus 2 by RT PCR NEGATIVE NEGATIVE Final    Comment: (NOTE) SARS-CoV-2 target nucleic acids are NOT DETECTED.  The SARS-CoV-2 RNA is generally detectable in upper respiratory specimens during the acute phase of infection. The lowest concentration of SARS-CoV-2 viral copies this assay can detect is 138 copies/mL. A negative result does not preclude SARS-Cov-2 infection and  should not be used as the sole basis for treatment or other patient management decisions. A negative result may occur with  improper specimen collection/handling, submission of specimen other than nasopharyngeal swab, presence of viral mutation(s) within the areas targeted by this assay, and inadequate number of viral copies(<138 copies/mL). A negative result must be combined with clinical observations, patient history, and epidemiological information. The expected result is Negative.  Fact Sheet for Patients:  EntrepreneurPulse.com.au  Fact Sheet for Healthcare Providers:  IncredibleEmployment.be  This test is no t yet approved or cleared by the [place] FDA and  has been authorized for detection and/or diagnosis of SARS-CoV-2 by FDA under an Emergency Use Authorization (EUA). This EUA will remain  in effect (meaning this test can be used) for the duration of the COVID-19 declaration under Section 564(b)(1) of the Act, 21 U.S.C.section 360bbb-3(b)(1), unless the authorization is terminated  or revoked sooner.       Influenza A by PCR NEGATIVE NEGATIVE Final   Influenza B by PCR NEGATIVE NEGATIVE Final    Comment: (NOTE) The Xpert Xpress SARS-CoV-2/FLU/RSV plus assay is intended as an aid in the diagnosis of influenza from Nasopharyngeal swab specimens and should not be used as a sole basis for treatment. Nasal washings and aspirates are unacceptable for Xpert Xpress SARS-CoV-2/FLU/RSV testing.  Fact Sheet for Patients: EntrepreneurPulse.com.au  Fact Sheet for Healthcare Providers: IncredibleEmployment.be  This test is not yet approved or cleared by the [place] FDA and has been authorized for detection and/or diagnosis of SARS-CoV-2 by FDA under an Emergency Use Authorization (EUA). This EUA will remain in effect (meaning this test can be used) for the duration of the COVID-19 declaration  under Section 564(b)(1) of the Act, 21 U.S.C. section 360bbb-3(b)(1), unless the authorization is terminated or revoked.     Resp Syncytial Virus by PCR NEGATIVE NEGATIVE Final    Comment: (NOTE) Fact Sheet for Patients: EntrepreneurPulse.com.au  Fact Sheet for Healthcare Providers: IncredibleEmployment.be  This test is not yet approved or cleared by the [place] FDA and has been authorized for detection and/or diagnosis of SARS-CoV-2 by FDA under an Emergency Use Authorization (EUA). This EUA will remain in effect (meaning this test can be used) for the duration of the COVID-19 declaration under Section 564(b)(1) of the Act, 21 U.S.C. section 360bbb-3(b)(1), unless the authorization is terminated or revoked.  Performed at [facility], [address] [place], [place] [address]   Body fluid culture w Gram Stain     Status: None (Preliminary result)   Collection Time: 08/15/22  2:45 PM   Specimen: PATH Cytology Pleural fluid  Result Value Ref Range Status   Specimen Description   Final    PLEURAL Performed at [facility], [address] [place], [place] [address]  Special Requests   Final    PLEURAL Performed at [facility], [address] [place], [place] [address]    Gram Stain   Final    WBC PRESENT, PREDOMINANTLY MONONUCLEAR NO ORGANISMS SEEN Performed at [facility], [place] [address] [place], [place] [address]    Culture PENDING  Incomplete   Report Status PENDING  Incomplete    Coagulation Studies: No results for input(s): "LABPROT", "INR" in the last 72 hours.  Urinalysis: No results for input(s): "COLORURINE", "LABSPEC", "PHURINE", "GLUCOSEU", "HGBUR", "BILIRUBINUR", "KETONESUR", "PROTEINUR", "UROBILINOGEN", "NITRITE", "LEUKOCYTESUR" in the last 72 hours.  Invalid input(s): "APPERANCEUR"    Imaging: US THORACENTESIS ASP PLEURAL SPACE W/IMG GUIDE  Result Date:  08/15/2022 INDICATION: History of DM type 2, CHF, CAD and ESRD on HD. Patient admitted for DOE and orthopnea found to have pleural effusion. Patient received for therapeutic thoracentesis. EXAM: ULTRASOUND GUIDED THERAPEUTIC RIGHT THORACENTESIS MEDICATIONS: 10 mL 1 % lidocaine COMPLICATIONS: None immediate. PROCEDURE: An ultrasound guided thoracentesis was thoroughly discussed with the patient and questions answered. The benefits, risks, alternatives and complications were also discussed. The patient understands and wishes to proceed with the procedure. Written consent was obtained. Ultrasound was performed to localize and mark an adequate pocket of fluid in the right chest. The area was then prepped and draped in the normal sterile fashion. 1% Lidocaine was used for local anesthesia. Under ultrasound guidance a 6 Fr Safe-T-Centesis catheter was introduced. Thoracentesis was performed. The catheter was removed and a dressing applied. FINDINGS: A total of approximately 800 cc of clear, yellow fluid was removed. IMPRESSION: Successful ultrasound guided right thoracentesis yielding 800 cc of pleural fluid. Read by: Narda Rutherford, AGNP-BC Electronically Signed   By: Ruthann Cancer M.D.   On: 08/15/2022 15:46   DG Chest Port 1 View  Result Date: 08/15/2022 CLINICAL DATA:  Status post right thoracentesis. EXAM: PORTABLE CHEST 1 VIEW COMPARISON:  August 14, 2022. FINDINGS: No pneumothorax status post right thoracentesis. No significant residual pleural effusion is noted. IMPRESSION: No pneumothorax status post right thoracentesis. Electronically Signed   By: Marijo Conception M.D.   On: 08/15/2022 15:11   DG Chest 2 View  Result Date: 08/14/2022 CLINICAL DATA:  Shortness of breath EXAM: CHEST - 2 VIEW COMPARISON:  X-ray 07/10/2022 and older FINDINGS: Underinflation. Small bilateral pleural effusions with some adjacent opacities. Atelectasis versus infiltrate. No pneumothorax. Slight vascular congestion. No edema  today. Stable cardiopericardial silhouette. Degenerative changes of the spine. Stable double lumen right IJ catheter. IMPRESSION: Underinflation with developing bilateral small effusions and adjacent opacities. Atelectasis versus infiltrate. Recommend follow-up. Right IJ catheter. Electronically Signed   By: Jill Side M.D.   On: 08/14/2022 15:33     Medications:    sodium chloride      amLODipine  5 mg Oral Daily   aspirin EC  81 mg Oral Daily   carvedilol  25 mg Oral BID   Chlorhexidine Gluconate Cloth  6 each Topical Q0600   clopidogrel  75 mg Oral Daily   ezetimibe  10 mg Oral Daily   feeding supplement (NEPRO CARB STEADY)  237 mL Oral TID BM   furosemide  80 mg Intravenous BID   heparin  5,000 Units Subcutaneous Q8H   hydrALAZINE  100 mg Oral Q8H   insulin aspart  0-5 Units Subcutaneous QHS   insulin aspart  0-9 Units Subcutaneous TID WC   insulin detemir  15 Units Subcutaneous QHS   isosorbide mononitrate  60 mg Oral  Daily   losartan  100 mg Oral Daily   pantoprazole  20 mg Oral Daily   rosuvastatin  10 mg Oral Daily   sodium chloride flush  3 mL Intravenous Q12H   sodium chloride, acetaminophen, albuterol, guaiFENesin-dextromethorphan, heparin, hydrALAZINE, nitroGLYCERIN, ondansetron (ZOFRAN) IV, sodium chloride flush  Assessment/ Plan:  Jimmy Olson is a 62 y.o.  male with past medical history including diabetes, heart failure, CAD, and end stage renal disease on hemodialysis. Patient presents with shortness of breath and has been admitted for Orthopnea [R06.01] Acute pulmonary edema ([place]) [J81.0] Pleural effusion [J90] Exertional dyspnea [R06.09] Hyperglycemia [R73.9] End stage renal disease on dialysis ([place]) [N18.6, Z99.2] S/P thoracentesis [Z98.890] Fluid overload [E87.70]  [facility] [place]/TTS/Rt chest permcath  End stage renal disease on hemodialysis.  Patient received scheduled dialysis yesterday, UF 1.5 L achieved.  At cardiology's recommendation,  patient receiving UF only treatment today, UF goal 2 to 2.5 L as tolerated.  Next treatment scheduled for Thursday.  Will defer discharge planning to primary team.  2. Anemia of chronic kidney disease Lab Results  Component Value Date   HGB 9.7 (L) 08/16/2022    Hgb within optimal range.  Patient receives Mircera at outpatient clinic.   3. Secondary Hyperparathyroidism: with outpatient labs: PTH 376, phosphorus 3.6, calcium 8.4 on 08/01/22.   Lab Results  Component Value Date   CALCIUM 8.5 (L) 08/16/2022   CAION 1.19 07/13/2022   PHOS 4.3 08/15/2022    Calcium and phosphorus acceptable. Will continue to monitor bone minerals during this admission.   4. Hypertension with chronic kidney disease. Home regimen includes carvedilol, ezetimibe, hydralazine, isosorbide, losartan and torsemide. Receiving these medications, Furosemide in place of Torsemide.   Blood pressure 153/82 during dialysis.  5. Diabetes mellitus type II with chronic kidney disease/renal manifestations: insulin dependent. Home regimen includes Levemir and novolog. Most recent hemoglobin A1c is 10.1 on 05/17/22.   Glucose elevated last night but within target today.  Primary team to manage sliding scale insulin.   LOS: 2   2/28/202411:10 AM

## 2022-08-17 LAB — CYTOLOGY - NON PAP

## 2022-08-19 LAB — BODY FLUID CULTURE W GRAM STAIN: Culture: NO GROWTH

## 2022-09-04 ENCOUNTER — Emergency Department: Payer: Medicare Other

## 2022-09-04 ENCOUNTER — Inpatient Hospital Stay: Payer: Medicare Other

## 2022-09-04 ENCOUNTER — Inpatient Hospital Stay
Admission: EM | Admit: 2022-09-04 | Discharge: 2022-09-18 | DRG: 208 | Disposition: E | Payer: Medicare Other | Attending: Pulmonary Disease | Admitting: Pulmonary Disease

## 2022-09-04 DIAGNOSIS — J9602 Acute respiratory failure with hypercapnia: Secondary | ICD-10-CM | POA: Diagnosis present

## 2022-09-04 DIAGNOSIS — Z992 Dependence on renal dialysis: Secondary | ICD-10-CM

## 2022-09-04 DIAGNOSIS — Z66 Do not resuscitate: Secondary | ICD-10-CM | POA: Diagnosis present

## 2022-09-04 DIAGNOSIS — J810 Acute pulmonary edema: Secondary | ICD-10-CM | POA: Diagnosis present

## 2022-09-04 DIAGNOSIS — G931 Anoxic brain damage, not elsewhere classified: Secondary | ICD-10-CM | POA: Diagnosis present

## 2022-09-04 DIAGNOSIS — I132 Hypertensive heart and chronic kidney disease with heart failure and with stage 5 chronic kidney disease, or end stage renal disease: Secondary | ICD-10-CM | POA: Diagnosis present

## 2022-09-04 DIAGNOSIS — Z7902 Long term (current) use of antithrombotics/antiplatelets: Secondary | ICD-10-CM

## 2022-09-04 DIAGNOSIS — I252 Old myocardial infarction: Secondary | ICD-10-CM

## 2022-09-04 DIAGNOSIS — I959 Hypotension, unspecified: Secondary | ICD-10-CM | POA: Diagnosis present

## 2022-09-04 DIAGNOSIS — Z515 Encounter for palliative care: Secondary | ICD-10-CM | POA: Diagnosis not present

## 2022-09-04 DIAGNOSIS — Z7982 Long term (current) use of aspirin: Secondary | ICD-10-CM

## 2022-09-04 DIAGNOSIS — E1110 Type 2 diabetes mellitus with ketoacidosis without coma: Secondary | ICD-10-CM | POA: Diagnosis present

## 2022-09-04 DIAGNOSIS — I428 Other cardiomyopathies: Secondary | ICD-10-CM | POA: Diagnosis present

## 2022-09-04 DIAGNOSIS — G4089 Other seizures: Secondary | ICD-10-CM | POA: Diagnosis present

## 2022-09-04 DIAGNOSIS — I161 Hypertensive emergency: Secondary | ICD-10-CM | POA: Diagnosis present

## 2022-09-04 DIAGNOSIS — Z86718 Personal history of other venous thrombosis and embolism: Secondary | ICD-10-CM

## 2022-09-04 DIAGNOSIS — E785 Hyperlipidemia, unspecified: Secondary | ICD-10-CM | POA: Diagnosis present

## 2022-09-04 DIAGNOSIS — J9601 Acute respiratory failure with hypoxia: Principal | ICD-10-CM | POA: Diagnosis present

## 2022-09-04 DIAGNOSIS — I5022 Chronic systolic (congestive) heart failure: Secondary | ICD-10-CM | POA: Diagnosis present

## 2022-09-04 DIAGNOSIS — E1122 Type 2 diabetes mellitus with diabetic chronic kidney disease: Secondary | ICD-10-CM | POA: Diagnosis present

## 2022-09-04 DIAGNOSIS — E8729 Other acidosis: Secondary | ICD-10-CM

## 2022-09-04 DIAGNOSIS — Z794 Long term (current) use of insulin: Secondary | ICD-10-CM | POA: Diagnosis not present

## 2022-09-04 DIAGNOSIS — D631 Anemia in chronic kidney disease: Secondary | ICD-10-CM | POA: Diagnosis present

## 2022-09-04 DIAGNOSIS — I5023 Acute on chronic systolic (congestive) heart failure: Secondary | ICD-10-CM | POA: Diagnosis present

## 2022-09-04 DIAGNOSIS — N2581 Secondary hyperparathyroidism of renal origin: Secondary | ICD-10-CM | POA: Diagnosis present

## 2022-09-04 DIAGNOSIS — I469 Cardiac arrest, cause unspecified: Secondary | ICD-10-CM | POA: Diagnosis present

## 2022-09-04 DIAGNOSIS — Z8616 Personal history of COVID-19: Secondary | ICD-10-CM

## 2022-09-04 DIAGNOSIS — Z8249 Family history of ischemic heart disease and other diseases of the circulatory system: Secondary | ICD-10-CM

## 2022-09-04 DIAGNOSIS — Z8673 Personal history of transient ischemic attack (TIA), and cerebral infarction without residual deficits: Secondary | ICD-10-CM

## 2022-09-04 DIAGNOSIS — Z833 Family history of diabetes mellitus: Secondary | ICD-10-CM

## 2022-09-04 DIAGNOSIS — Z79899 Other long term (current) drug therapy: Secondary | ICD-10-CM

## 2022-09-04 DIAGNOSIS — Z7901 Long term (current) use of anticoagulants: Secondary | ICD-10-CM

## 2022-09-04 DIAGNOSIS — I255 Ischemic cardiomyopathy: Secondary | ICD-10-CM | POA: Diagnosis present

## 2022-09-04 DIAGNOSIS — R578 Other shock: Secondary | ICD-10-CM | POA: Diagnosis present

## 2022-09-04 DIAGNOSIS — R092 Respiratory arrest: Secondary | ICD-10-CM

## 2022-09-04 DIAGNOSIS — Z1152 Encounter for screening for COVID-19: Secondary | ICD-10-CM

## 2022-09-04 DIAGNOSIS — I2510 Atherosclerotic heart disease of native coronary artery without angina pectoris: Secondary | ICD-10-CM | POA: Diagnosis present

## 2022-09-04 DIAGNOSIS — I468 Cardiac arrest due to other underlying condition: Secondary | ICD-10-CM | POA: Diagnosis present

## 2022-09-04 DIAGNOSIS — N186 End stage renal disease: Secondary | ICD-10-CM | POA: Diagnosis present

## 2022-09-04 DIAGNOSIS — E876 Hypokalemia: Secondary | ICD-10-CM | POA: Diagnosis present

## 2022-09-04 DIAGNOSIS — R739 Hyperglycemia, unspecified: Secondary | ICD-10-CM

## 2022-09-04 DIAGNOSIS — R7401 Elevation of levels of liver transaminase levels: Secondary | ICD-10-CM | POA: Diagnosis present

## 2022-09-04 DIAGNOSIS — Z955 Presence of coronary angioplasty implant and graft: Secondary | ICD-10-CM

## 2022-09-04 LAB — RENAL FUNCTION PANEL
Albumin: 3.2 g/dL — ABNORMAL LOW (ref 3.5–5.0)
Anion gap: 10 (ref 5–15)
BUN: 53 mg/dL — ABNORMAL HIGH (ref 6–20)
CO2: 26 mmol/L (ref 22–32)
Calcium: 10 mg/dL (ref 8.9–10.3)
Chloride: 99 mmol/L (ref 98–111)
Creatinine, Ser: 4.69 mg/dL — ABNORMAL HIGH (ref 0.61–1.24)
GFR, Estimated: 14 mL/min — ABNORMAL LOW (ref >60)
Glucose, Bld: 378 mg/dL — ABNORMAL HIGH (ref 70–99)
Phosphorus: 4.1 mg/dL (ref 2.5–4.6)
Potassium: 4.2 mmol/L (ref 3.5–5.1)
Sodium: 135 mmol/L (ref 135–145)

## 2022-09-04 LAB — BLOOD GAS, ARTERIAL
Acid-Base Excess: 2 mmol/L (ref 0.0–2.0)
Acid-base deficit: 5.6 mmol/L — ABNORMAL HIGH (ref 0.0–2.0)
Bicarbonate: 24 mmol/L (ref 20.0–28.0)
Bicarbonate: 27.3 mmol/L (ref 20.0–28.0)
FIO2: 100 %
FIO2: 40 %
MECHVT: 550 mL
MECHVT: 450 mL
Mechanical Rate: 24
O2 Saturation: 99.3 %
O2 Saturation: 97.8 %
PEEP: 10 cmH2O
PEEP: 8 cmH2O
Patient temperature: 37
Patient temperature: 37
RATE: 16 resp/min
pCO2 arterial: 69 mmHg (ref 32–48)
pCO2 arterial: 44 mmHg (ref 32–48)
pH, Arterial: 7.15 — CL (ref 7.35–7.45)
pH, Arterial: 7.4 (ref 7.35–7.45)
pO2, Arterial: 365 mmHg — ABNORMAL HIGH (ref 83–108)
pO2, Arterial: 83 mmHg (ref 83–108)

## 2022-09-04 LAB — CBC
HCT: 30.6 % — ABNORMAL LOW (ref 39.0–52.0)
Hemoglobin: 10.5 g/dL — ABNORMAL LOW (ref 13.0–17.0)
MCH: 30 pg (ref 26.0–34.0)
MCHC: 34.3 g/dL (ref 30.0–36.0)
MCV: 87.4 fL (ref 80.0–100.0)
Platelets: 131 10*3/uL — ABNORMAL LOW (ref 150–400)
RBC: 3.5 MIL/uL — ABNORMAL LOW (ref 4.22–5.81)
RDW: 13.6 % (ref 11.5–15.5)
WBC: 7.6 10*3/uL (ref 4.0–10.5)
nRBC: 0 % (ref 0.0–0.2)

## 2022-09-04 LAB — BASIC METABOLIC PANEL
Anion gap: 12 (ref 5–15)
BUN: 55 mg/dL — ABNORMAL HIGH (ref 6–20)
CO2: 26 mmol/L (ref 22–32)
Calcium: 10.2 mg/dL (ref 8.9–10.3)
Chloride: 99 mmol/L (ref 98–111)
Creatinine, Ser: 4.74 mg/dL — ABNORMAL HIGH (ref 0.61–1.24)
GFR, Estimated: 13 mL/min — ABNORMAL LOW (ref >60)
Glucose, Bld: 385 mg/dL — ABNORMAL HIGH (ref 70–99)
Potassium: 4.2 mmol/L (ref 3.5–5.1)
Sodium: 137 mmol/L (ref 135–145)

## 2022-09-04 LAB — URINALYSIS, COMPLETE (UACMP) WITH MICROSCOPIC
Bilirubin Urine: NEGATIVE
Glucose, UA: >=500 mg/dL — AB
Hgb urine dipstick: NEGATIVE
Ketones, ur: NEGATIVE mg/dL
Leukocytes,Ua: NEGATIVE
Nitrite: NEGATIVE
Protein, ur: >=300 mg/dL — AB
Specific Gravity, Urine: 1.012 (ref 1.005–1.030)
Squamous Epithelial / HPF: NONE SEEN /HPF (ref 0–5)
pH: 7 (ref 5.0–8.0)

## 2022-09-04 LAB — URINE DRUG SCREEN, QUALITATIVE (ARMC ONLY)
Amphetamines, Ur Screen: NOT DETECTED
Barbiturates, Ur Screen: NOT DETECTED
Benzodiazepine, Ur Scrn: NOT DETECTED
Cannabinoid 50 Ng, Ur ~~LOC~~: NOT DETECTED
Cocaine Metabolite,Ur ~~LOC~~: NOT DETECTED
MDMA (Ecstasy)Ur Screen: NOT DETECTED
Methadone Scn, Ur: NOT DETECTED
Opiate, Ur Screen: NOT DETECTED
Phencyclidine (PCP) Ur S: NOT DETECTED
Tricyclic, Ur Screen: NOT DETECTED

## 2022-09-04 LAB — COMPREHENSIVE METABOLIC PANEL
ALT: 218 U/L — ABNORMAL HIGH (ref 0–44)
AST: 231 U/L — ABNORMAL HIGH (ref 15–41)
Albumin: 3 g/dL — ABNORMAL LOW (ref 3.5–5.0)
Alkaline Phosphatase: 113 U/L (ref 38–126)
Anion gap: 17 — ABNORMAL HIGH (ref 5–15)
BUN: 46 mg/dL — ABNORMAL HIGH (ref 6–20)
CO2: 21 mmol/L — ABNORMAL LOW (ref 22–32)
Calcium: 10.8 mg/dL — ABNORMAL HIGH (ref 8.9–10.3)
Chloride: 96 mmol/L — ABNORMAL LOW (ref 98–111)
Creatinine, Ser: 4.62 mg/dL — ABNORMAL HIGH (ref 0.61–1.24)
GFR, Estimated: 14 mL/min — ABNORMAL LOW (ref >60)
Glucose, Bld: 675 mg/dL (ref 70–99)
Potassium: 4.4 mmol/L (ref 3.5–5.1)
Sodium: 134 mmol/L — ABNORMAL LOW (ref 135–145)
Total Bilirubin: 0.9 mg/dL (ref 0.3–1.2)
Total Protein: 5.8 g/dL — ABNORMAL LOW (ref 6.5–8.1)

## 2022-09-04 LAB — CBC WITH DIFFERENTIAL/PLATELET
Abs Immature Granulocytes: 0.08 10*3/uL — ABNORMAL HIGH (ref 0.00–0.07)
Basophils Absolute: 0 10*3/uL (ref 0.0–0.1)
Basophils Relative: 0 %
Eosinophils Absolute: 0.3 10*3/uL (ref 0.0–0.5)
Eosinophils Relative: 4 %
HCT: 34.6 % — ABNORMAL LOW (ref 39.0–52.0)
Hemoglobin: 10.9 g/dL — ABNORMAL LOW (ref 13.0–17.0)
Immature Granulocytes: 1 %
Lymphocytes Relative: 49 %
Lymphs Abs: 4.4 10*3/uL — ABNORMAL HIGH (ref 0.7–4.0)
MCH: 30 pg (ref 26.0–34.0)
MCHC: 31.5 g/dL (ref 30.0–36.0)
MCV: 95.3 fL (ref 80.0–100.0)
Monocytes Absolute: 0.4 10*3/uL (ref 0.1–1.0)
Monocytes Relative: 4 %
Neutro Abs: 3.8 10*3/uL (ref 1.7–7.7)
Neutrophils Relative %: 42 %
Platelets: 153 10*3/uL (ref 150–400)
RBC: 3.63 MIL/uL — ABNORMAL LOW (ref 4.22–5.81)
RDW: 13.7 % (ref 11.5–15.5)
WBC: 8.9 10*3/uL (ref 4.0–10.5)
nRBC: 0 % (ref 0.0–0.2)

## 2022-09-04 LAB — LACTIC ACID, PLASMA
Lactic Acid, Venous: >9 mmol/L (ref 0.5–1.9)
Lactic Acid, Venous: 2 mmol/L (ref 0.5–1.9)

## 2022-09-04 LAB — ETHANOL: Alcohol, Ethyl (B): <10 mg/dL (ref <10)

## 2022-09-04 LAB — TROPONIN I (HIGH SENSITIVITY)
Troponin I (High Sensitivity): 267 ng/L (ref <18)
Troponin I (High Sensitivity): 371 ng/L (ref <18)

## 2022-09-04 LAB — OSMOLALITY: Osmolality: 331 mOsm/kg (ref 275–295)

## 2022-09-04 LAB — GLUCOSE, CAPILLARY: Glucose-Capillary: 504 mg/dL (ref 70–99)

## 2022-09-04 LAB — APTT: aPTT: 27 seconds (ref 24–36)

## 2022-09-04 LAB — MRSA NEXT GEN BY PCR, NASAL: MRSA by PCR Next Gen: NOT DETECTED

## 2022-09-04 LAB — PHOSPHORUS: Phosphorus: 8.9 mg/dL — ABNORMAL HIGH (ref 2.5–4.6)

## 2022-09-04 LAB — CBG MONITORING, ED
Glucose-Capillary: 596 mg/dL (ref 70–99)
Glucose-Capillary: >600 mg/dL (ref 70–99)

## 2022-09-04 LAB — MAGNESIUM
Magnesium: 2.4 mg/dL (ref 1.7–2.4)
Magnesium: 2.3 mg/dL (ref 1.7–2.4)

## 2022-09-04 LAB — BETA-HYDROXYBUTYRIC ACID: Beta-Hydroxybutyric Acid: 0.11 mmol/L (ref 0.05–0.27)

## 2022-09-04 LAB — BRAIN NATRIURETIC PEPTIDE: B Natriuretic Peptide: 2772.9 pg/mL — ABNORMAL HIGH (ref 0.0–100.0)

## 2022-09-04 LAB — PROCALCITONIN: Procalcitonin: 0.13 ng/mL

## 2022-09-04 MED ORDER — PRISMASOL BGK 0/2.5 32-2.5 MEQ/L EC SOLN: Status: DC

## 2022-09-04 MED ORDER — HEPARIN SODIUM (PORCINE) 1000 UNIT/ML DIALYSIS: 1000.0000 [IU] | INTRAMUSCULAR | Status: DC | PRN

## 2022-09-04 MED ORDER — ORAL CARE MOUTH RINSE: 15.0000 mL | OROMUCOSAL | Status: DC | PRN

## 2022-09-04 MED ORDER — NICARDIPINE HCL IN NACL 20-0.86 MG/200ML-% IV SOLN
3.0000 mg/h | INTRAVENOUS | Status: DC
  Administered 2022-09-04: 5 mg/h via INTRAVENOUS
  Filled 2022-09-04: qty 200

## 2022-09-04 MED ORDER — EPINEPHRINE 1 MG/10ML IJ SOSY
PREFILLED_SYRINGE | INTRAMUSCULAR | Status: AC | PRN
  Administered 2022-09-04 (×3): 1 mg via INTRAVENOUS

## 2022-09-04 MED ORDER — NOREPINEPHRINE 4 MG/250ML-% IV SOLN: 0.0000 ug/min | INTRAVENOUS | Status: DC

## 2022-09-04 MED ORDER — PROPOFOL 1000 MG/100ML IV EMUL: 5.0000 ug/kg/min | INTRAVENOUS | Status: DC

## 2022-09-04 MED ORDER — GLYCOPYRROLATE 0.2 MG/ML IJ SOLN: 0.2000 mg | INTRAMUSCULAR | Status: DC | PRN

## 2022-09-04 MED ORDER — LEVETIRACETAM IN NACL 1000 MG/100ML IV SOLN
1000.0000 mg | Freq: Once | INTRAVENOUS | Status: AC
  Administered 2022-09-04: 1000 mg via INTRAVENOUS
  Filled 2022-09-04: qty 100

## 2022-09-04 MED ORDER — CALCIUM CHLORIDE 10 % IV SOLN
INTRAVENOUS | Status: AC | PRN
  Administered 2022-09-04 (×2): 1 g via INTRAVENOUS

## 2022-09-04 MED ORDER — FENTANYL BOLUS VIA INFUSION
50.0000 ug | INTRAVENOUS | Status: DC | PRN
  Administered 2022-09-04: 100 ug via INTRAVENOUS

## 2022-09-04 MED ORDER — POLYETHYLENE GLYCOL 3350 17 G PO PACK: 17.0000 g | PACK | Freq: Every day | ORAL | Status: DC | PRN

## 2022-09-04 MED ORDER — ACETAMINOPHEN 650 MG RE SUPP: 650.0000 mg | Freq: Four times a day (QID) | RECTAL | Status: DC | PRN

## 2022-09-04 MED ORDER — SODIUM BICARBONATE 8.4 % IV SOLN
INTRAVENOUS | Status: AC | PRN
  Administered 2022-09-04 (×3): 50 meq via INTRAVENOUS

## 2022-09-04 MED ORDER — SODIUM CHLORIDE 0.9 % IV SOLN
250.0000 [IU]/h | INTRAVENOUS | Status: DC
  Filled 2022-09-04: qty 2

## 2022-09-04 MED ORDER — DEXTROSE 50 % IV SOLN: 0.0000 mL | INTRAVENOUS | Status: DC | PRN

## 2022-09-04 MED ORDER — MIDAZOLAM HCL 2 MG/2ML IJ SOLN: 2.0000 mg | INTRAMUSCULAR | Status: DC | PRN

## 2022-09-04 MED ORDER — MIDAZOLAM HCL 2 MG/2ML IJ SOLN
INTRAMUSCULAR | Status: AC
  Filled 2022-09-04: qty 4

## 2022-09-04 MED ORDER — ORAL CARE MOUTH RINSE
15.0000 mL | OROMUCOSAL | Status: DC
  Administered 2022-09-04 (×2): 15 mL via OROMUCOSAL

## 2022-09-04 MED ORDER — FAMOTIDINE 20 MG PO TABS: 20.0000 mg | ORAL_TABLET | Freq: Two times a day (BID) | ORAL | Status: DC

## 2022-09-04 MED ORDER — MIDAZOLAM HCL 2 MG/2ML IJ SOLN
4.0000 mg | Freq: Once | INTRAMUSCULAR | Status: AC
  Administered 2022-09-04: 4 mg via INTRAVENOUS

## 2022-09-04 MED ORDER — DOCUSATE SODIUM 100 MG PO CAPS: 100.0000 mg | ORAL_CAPSULE | Freq: Two times a day (BID) | ORAL | Status: DC | PRN

## 2022-09-04 MED ORDER — NICARDIPINE HCL IN NACL 40-0.83 MG/200ML-% IV SOLN
3.0000 mg/h | INTRAVENOUS | Status: DC
  Filled 2022-09-04: qty 200

## 2022-09-04 MED ORDER — PROPOFOL 1000 MG/100ML IV EMUL: 0.0000 ug/kg/min | INTRAVENOUS | Status: DC

## 2022-09-04 MED ORDER — EPINEPHRINE 1 MG/10ML IJ SOSY
PREFILLED_SYRINGE | INTRAMUSCULAR | Status: AC | PRN
  Administered 2022-09-04: 1 mg via INTRAVENOUS

## 2022-09-04 MED ORDER — PROPOFOL 1000 MG/100ML IV EMUL
INTRAVENOUS | Status: AC
  Administered 2022-09-04: 50 ug/kg/min via INTRAVENOUS
  Filled 2022-09-04: qty 100

## 2022-09-04 MED ORDER — ACETAMINOPHEN 325 MG PO TABS: 650.0000 mg | ORAL_TABLET | Freq: Four times a day (QID) | ORAL | Status: DC | PRN

## 2022-09-04 MED ORDER — SODIUM BICARBONATE 8.4 % IV SOLN: 100.0000 meq | Freq: Once | INTRAVENOUS | Status: DC

## 2022-09-04 MED ORDER — INSULIN REGULAR(HUMAN) IN NACL 100-0.9 UT/100ML-% IV SOLN: INTRAVENOUS | Status: DC

## 2022-09-04 MED ORDER — SODIUM CHLORIDE 0.9 % FOR CRRT: INTRAVENOUS_CENTRAL | Status: DC | PRN

## 2022-09-04 MED ORDER — GLYCOPYRROLATE 1 MG PO TABS: 1.0000 mg | ORAL_TABLET | ORAL | Status: DC | PRN

## 2022-09-04 MED ORDER — INSULIN ASPART 100 UNIT/ML IJ SOLN
10.0000 [IU] | Freq: Once | INTRAMUSCULAR | Status: AC
  Administered 2022-09-04: 10 [IU] via INTRAVENOUS
  Filled 2022-09-04: qty 1

## 2022-09-04 MED ORDER — HEPARIN BOLUS VIA INFUSION (CRRT): 1000.0000 [IU] | INTRAVENOUS | Status: DC | PRN

## 2022-09-04 MED ORDER — SODIUM CHLORIDE 0.9 % IV SOLN: INTRAVENOUS | Status: DC

## 2022-09-04 MED ORDER — FENTANYL 2500MCG IN NS 250ML (10MCG/ML) PREMIX INFUSION
50.0000 ug/h | INTRAVENOUS | Status: DC
  Administered 2022-09-04 (×2): 100 ug/h via INTRAVENOUS
  Administered 2022-09-04: 200 ug/h via INTRAVENOUS
  Filled 2022-09-04 (×2): qty 250

## 2022-09-04 MED ORDER — POLYVINYL ALCOHOL 1.4 % OP SOLN: 1.0000 [drp] | Freq: Four times a day (QID) | OPHTHALMIC | Status: DC | PRN

## 2022-09-04 MED ORDER — ATROPINE SULFATE 1 MG/10ML IJ SOSY
PREFILLED_SYRINGE | INTRAMUSCULAR | Status: AC | PRN
  Administered 2022-09-04: 1 mg via INTRAVENOUS

## 2022-09-04 MED ORDER — NOREPINEPHRINE 4 MG/250ML-% IV SOLN
INTRAVENOUS | Status: AC
  Administered 2022-09-04: 10 ug/min via INTRAVENOUS
  Filled 2022-09-04: qty 250

## 2022-09-04 NOTE — Code Documentation (Addendum)
Pt to ED via EMS with c/o SOB, pt is dialysis patient with dialysis catheter to R upper chest. Per EMS pt c/o SOB starting 73mins PTA and had taken his own albuterol inhaler. Per EMS rhonchi in all fields in route, pt given breathing treatment en route. Pt EMS pt started to froth at the mouth, and became unresponsive, per EMS pt was bagged PTA placed on the monitor and patient was bradycardic. Per EMS pt was 84% on arrival and on NRB pt was 88%. Per EMS CPR started just prior to arrival to hospital.

## 2022-09-04 NOTE — Progress Notes (Signed)
   09/07/2022 0100  Spiritual Encounters  Type of Visit Initial  Care provided to: Family  Referral source Nurse (RN/NT/LPN)  Reason for visit Trauma  OnCall Visit Yes  Spiritual Framework  Presenting Themes Caregiving needs  Interventions  Spiritual Care Interventions Made Compassionate presence;Reflective listening   Chaplain responded to a CPR in progress to provide compassionate presence for the wife of patient.

## 2022-09-04 NOTE — Progress Notes (Signed)
Serum osmolality called from Lab as critical at 331. This RN took report from lab and notify MD of critical value.

## 2022-09-04 NOTE — Progress Notes (Signed)
RT assisted with patient transport to CT and back with no complications. Pt transported on trilogy ventilator.

## 2022-09-04 NOTE — Progress Notes (Signed)
Endotracheal tube withdrew 3cm to 24 @ the lip after post intubation CXR was obtained. CXR was redone and tube was in satisfactory position according to Dr. Leonides Schanz.

## 2022-09-04 NOTE — Progress Notes (Signed)
Patient transferred up from ER, patient placed on Servo-I from Trilogy, Report received from ED therapist. [name] NP made changes to ventilator order and chages made to reflect those orders once patient placed on Servo-I.

## 2022-09-04 NOTE — Progress Notes (Signed)
Wife and daughter at bedside supporting patient. All needs met at this time.

## 2022-09-04 NOTE — Progress Notes (Signed)
eLink Physician-Brief Progress Note Patient Name: Jimmy Olson DOB: Jul 14, 1960 MRN: [id number]   Date of Service  09/07/2022  HPI/Events of Note  61/M with ESRD on HD, DM, CHF (LVEF 25-40%), who presents with shortness of breawth. Per EMS, symptoms were acute in onset (30 mins prior to call), unrelieved by bronchodilator therapy. He was brought to the ED, and was bagged en route as he was desaturating in the 80s. Pt deteriorated to PEA and CPR started just prior to arrival in the ED.    eICU Interventions  S/p cardiac arrest - Etiology unclear.  - Pt currently unresponsive, not triggering the vent, pupils dilated.  - With myoclonic jerks, probably from anoxic injury.  - Planned for CT head. Started on keppra.  - Will maintain on seizure precuations, give PRN ativan as needed.  - Had been on levophed earlier, which has been weaned off as pt is now hypertensive. Pt about to be started on nicardipine drip.  - Serial EKG, troponin.  - Plan for echo in AM.  - Currently intubated, on ventilator support   - Maintain on TV 4-9ml/kg PBW, target plateau pressures <30   - Titrate FIO2 and PEEP to maintain Spo2 >90%   - Serial ABG, will make further vent adjustments as necessary - Hyperglycemic. Anion gap elevated, but may be due to underlying lactic acidosis (no ketones in urine). Will start on an insulin drip.  - Pt planned for CRRT.  - Monitor I/Os, daily weights. Monitor electrolytes, correct abnormalities as warranted.  - No signs of acute infection.  - Follow cultures, trend lactate. Will maintain low threshold to initiate antibiotics.  - Overall prognosis poor.         [place] 08/30/2022, 3:59 AM

## 2022-09-04 NOTE — IPAL (Signed)
Interdisciplinary Goals of Care Family Meeting     Date carried out: 08/29/2022 Location of the meeting: Bedside   Member's involved: NP and Family Member or next of kin   Durable Power of Tour manager: Patient's wife [employer], Daughter Eyoel Daws   Discussion:  Advance Care Planning/Goals of Care discussion was performed during the course of treatment to decide on type of care right for this patient following admission to the ICU.   I met with patient's wife at the bedside and talked with patient's daughter Mendel Romeo how is a Pediatric NICU RN at [facility] over the phone to discuss goals of care in details following change in patient's current status. Reviewed patient's lab, ABGs, vital signs including unstable HR and extremely elevated blood pressure and  CT head which showed global anoxic Injury and overall poor prognosis with the family and answered all their questions.   Discussed prognosis, expected outcome with or without ongoing aggressive treatments and the options for de-escalation of care.   Diagnosis(es):PEA Cardiac Arrest,  Acute hypoxic hypercapnic respiratory failure secondary Flash Pulmonary Edema,  Post Hypoxic Anoxic Brain Injury, Seizures, DKA, Multi Organ Failure, ESRD Prognosis: Poor Code Status: FULL Disposition: ICU Next Steps:  Family understands the situation. They have consented and agreed to DNR/DNI and would not wish to pursue any aggressive treatment including CRRT, Vasoactive drips or continuous nutrition.  Patient's family would like to proceed with full comfort care including terminal extubation once all family members are able to come to the bedside.   Family are satisfied with Plan of action and management. All questions answered     Total Time Spent Face to Face addressing advance care planning in the presence of the Patient: 35 minutes       [name], DNP, FNP-C, AGACNP-BC Acute Care Nurse  Practitioner [facility] Pager: [phone] [place] [facility]

## 2022-09-04 NOTE — H&P (Signed)
NAME:  Jimmy Olson, MRN:  [id number], DOB:  [birth year], LOS: 0 ADMISSION DATE:  09/06/2022, CONSULTATION DATE: 09/10/2022 REFERRING MD: Gena Fray, CHIEF COMPLAINT: Cardiac arrest   HPI  Jimmy Olson is a 62 y.o. male with pertinent PMHx of CAD s/p LAD stenting in December 2023, mixed ischemic and nonischemic cardiomyopathy with an EF of 40-45% November 2023, chronic HFrEF, HTN, HLD, T2DM, ESRD on HD, left upper extremity DVT previously on Eliquis, and TIA who presented to the ED postcardiac arrest  Per ED and EMS reports, patient complained of shortness of breath 30 minutes prior to EMS arrival.  On EMS arrival, patient was noted to be in respiratory distress with audible wheezing and rhonchi throughout the lung fields.  He was treated with multiple doses of DuoNebs.  Shortly after patient started forming at the mouth and became unresponsive.  He was placed on the monitor which showed bradycardia with sats in the low 80s on nonrebreathe.  Per ED reports,  patient  went into PEA cardiac arrest  4 minutes prior to arrival to the ED and CPR initiated with assisted ventilations via bag-valve-mask but no advanced airway was placed and no medications given. On review of his chart, patient was recently discharged following admission with acute on chronic hypoxic respiratory failure secondary to flash pulmonary edema, hypertensive urgency and NSTEMI status post diagnostic cardiac catheterization.   ED Course: On arrival pt was in PEA so CPR was continued and pt received a total of 4 rounds of Epi, 3 amps of Bicarb and 2 g of calcium prior to ROSC. Initial vital signs showed HR of 124 beats/minute, BP 227/127 mm Hg, the RR 25 breaths/minute, and the oxygen saturation 100 % on the vent  and a temperature of 90.15F (32.6C). Patient was unresponsive with GCS of 3. He was emergently intubated in the ED for airway protection. Patient became bradycardic and hypotensive post intubation and was given Atropine  and started on Levophed. PCCM consulted for admission.  Pertinent Labs/Diagnostics Findings: Na+/ K+: 134/4.4 Glucose 675 BUN/Cr.:46/4.62  AST/ALT:231/218, CO2:21, Anion Gap 17 WBC: 8.9 Hgb/Hct:10.9/34.6  PCT: 0.13 Lactic acid: >9Troponin:267  BNP: 2772.9  pO2 365; pCO2 69; pH 7.15;  HCO3 24, %O2 Sat 99.3.  CXR> Stable cardiomegaly with mild to moderate severity pulmonary edema.CTH> pending Medication administered: Insulin for hyperglycemia  Past Medical History  CAD s/p LAD stenting in December 2023, mixed ischemic and nonischemic cardiomyopathy with an EF of 40-45% November 2023, chronic HFrEF, HTN, HLD, T2DM, ESRD on HD, left upper extremity DVT   Significant Hospital Events   3/18: Admitted to the ICU status post PEA cardiac arrest  Consults:  Nephrology Cardiology Neurology  Procedures:  3/18: Intubation 3/18: Right radial Arterial Line  Significant Diagnostic Tests:  3/18: Chest Xray> 3/18: Noncontrast CT head>  Interim History / Subjective:    Micro Data:  3/18: SARS-CoV-2 PCR> negative 3/18: Influenza PCR> negative 3/18: Blood culture x2> 3/18: MRSA PCR>>   Antimicrobials:  None  OBJECTIVE  Blood pressure (!) 179/88, pulse 93, temperature (!) 96.8 F (36 C), resp. rate (!) 24, SpO2 100 %.    Vent Mode: AC FiO2 (%):  [60 %-100 %] 60 % Set Rate:  [16 bmp-24 bmp] 24 bmp Vt Set:  [550 mL] 550 mL PEEP:  [8 cmH20-10 cmH20] 8 cmH20   Intake/Output Summary (Last 24 hours) at 09/02/2022 0249 Last data filed at 09/16/2022 0158 Gross per 24 hour  Intake 12.6 ml  Output --  Net  12.6 ml   There were no vitals filed for this visit.  Physical Examination  GENERAL: 62 year-old critically ill patient lying in the bed intubated and sedated EYES: PEERLA. No scleral icterus. Extraocular muscles intact.  HEENT: Head atraumatic, normocephalic. Oropharynx and nasopharynx clear.  NECK:  No JVD, supple  LUNGS: Normal breath sounds bilaterally.  No use of accessory  muscles of respiration.  CARDIOVASCULAR: S1, S2 normal. No murmurs, rubs, or gallops.  ABDOMEN: Soft, NTND EXTREMITIES:No clubbing, cyanosis or edema. DP/PT2+, Radials 2+ and equal bilaterally.  Capillary refill is less than 3 seconds in all extremities. Pulses palpable distally. NEUROLOGIC: The patient is intubated and sedated. Patient does not respond to verbal stimuli.  Does not respond to deep sternal rub.  Does not follow commands.  No verbalizations noted. II: patient does not respond confrontation bilaterally, pupils right 3 mm, left 3 mm non reactive and fixed bilaterally III,IV,VI: doll's response absent bilaterally.  V,VII: corneal reflex absent bilaterally  VIII: patient does not respond to verbal stimuli IX,X: gag reflex absent, XI: trapezius strength unable to test bilaterally XII: tongue strength unable to test Extremities flaccid throughout.  No spontaneous movement noted.  No purposeful movements noted. Does not respond to noxious stimuli in any extremity. SKIN: No obvious rash, lesion, or ulcer. Warm to touch Labs/imaging that I personally reviewed  (right click and "Reselect all SmartList Selections" daily)     Labs   CBC: Recent Labs  Lab 09/06/2022 0120  WBC 8.9  NEUTROABS 3.8  HGB 10.9*  HCT 34.6*  MCV 95.3  PLT 0000000    Basic Metabolic Panel: Recent Labs  Lab 08/20/2022 0120 09/01/2022 0121  NA 134*  --   K 4.4  --   CL 96*  --   CO2 21*  --   GLUCOSE 675*  --   BUN 46*  --   CREATININE 4.62*  --   CALCIUM 10.8*  --   MG  --  2.4  PHOS  --  8.9*   GFR: CrCl cannot be calculated (Unknown ideal weight.). Recent Labs  Lab 09/14/2022 0120 09/16/2022 0121  PROCALCITON  --  0.13  WBC 8.9  --   LATICACIDVEN >9.0*  --     Liver Function Tests: Recent Labs  Lab 09/17/2022 0120  AST 231*  ALT 218*  ALKPHOS 113  BILITOT 0.9  PROT 5.8*  ALBUMIN 3.0*   No results for input(s): "LIPASE", "AMYLASE" in the last 168 hours. No results for input(s): "AMMONIA"  in the last 168 hours.  ABG    Component Value Date/Time   PHART 7.15 (LL) 09/17/2022 0131   PCO2ART 69 (HH) 08/28/2022 0131   PO2ART 365 (H) 09/15/2022 0131   HCO3 24.0 09/03/2022 0131   TCO2 20 (L) 07/13/2022 1323   ACIDBASEDEF 5.6 (H) 09/04/2022 0131   O2SAT 99.3 09/04/2022 0131     Coagulation Profile: No results for input(s): "INR", "PROTIME" in the last 168 hours.  Cardiac Enzymes: No results for input(s): "CKTOTAL", "CKMB", "CKMBINDEX", "TROPONINI" in the last 168 hours.  HbA1C: Hgb A1c MFr Bld  Date/Time Value Ref Range Status  05/17/2022 08:23 PM 10.1 (H) 4.8 - 5.6 % Final    Comment:    (NOTE)         Prediabetes: 5.7 - 6.4         Diabetes: >6.4         Glycemic control for adults with diabetes: <7.0   07/14/2021 08:22 PM 10.1 (H) 4.8 -  5.6 % Final    Comment:    (NOTE)         Prediabetes: 5.7 - 6.4         Diabetes: >6.4         Glycemic control for adults with diabetes: <7.0     CBG: Recent Labs  Lab 08/22/2022 0102 09/04/2022 0144  GLUCAP 596* >600*    Review of Systems:   Unable to be obtained secondary to the patient's intubated and sedated status.    Past Medical History  He,  has a past medical history of Anemia, Anxiety and depression, ATN (acute tubular necrosis) ([place]), CAD (coronary artery disease), Chronic HFrEF (heart failure with reduced ejection fraction) ([place]), DVT of axillary vein, acute left ([place]) (05/2021), ESRD (end stage renal disease) on dialysis [facility]), History of 2019 novel coronavirus disease (COVID-19) (03/06/2020), HLD (hyperlipidemia), Hyperkalemia (12/23/2020), Hypertension, Left carotid artery stenosis (08/18/2021), Long term current use of anticoagulant, Mixed Ischemic & Nonischemic Cardiomyopathy, NSTEMI (non-ST elevated myocardial infarction) ([place]) (07/15/2020), Sepsis ([place]), Suicidal ideations, TIA (transient ischemic attack) (06/2021), and Type 2 diabetes mellitus treated with insulin ([place]).   Surgical History    Past  Surgical History:  Procedure Laterality Date   APPENDECTOMY     AV FISTULA PLACEMENT Left 09/29/2021   Procedure: ARTERIOVENOUS (AV) FISTULA CREATION ( RADIAL CEPHALIC);  Surgeon: Algernon Huxley, MD;  Location: [facility] ORS;  Service: Vascular;  Laterality: Left;   CARDIAC CATHETERIZATION     CORONARY ATHERECTOMY N/A 06/07/2022   Procedure: CORONARY ATHERECTOMY;  Surgeon: Wellington Hampshire, MD;  Location: [place] [facility];  Service: Cardiovascular;  Laterality: N/A;   CORONARY STENT INTERVENTION N/A 06/07/2022   Procedure: CORONARY STENT INTERVENTION;  Surgeon: Wellington Hampshire, MD;  Location: [place] [facility];  Service: Cardiovascular;  Laterality: N/A;   DIALYSIS/PERMA CATHETER INSERTION N/A 06/02/2021   Procedure: DIALYSIS/PERMA CATHETER INSERTION;  Surgeon: Algernon Huxley, MD;  Location: [place] [facility];  Service: Cardiovascular;  Laterality: N/A;   INTRAVASCULAR IMAGING/OCT N/A 06/07/2022   Procedure: INTRAVASCULAR IMAGING/OCT;  Surgeon: Wellington Hampshire, MD;  Location: [place] [facility];  Service: Cardiovascular;  Laterality: N/A;   LEFT HEART CATH AND CORONARY ANGIOGRAPHY N/A 06/03/2021   Procedure: LEFT HEART CATH AND CORONARY ANGIOGRAPHY;  Surgeon: Wellington Hampshire, MD;  Location: [place] [facility];  Service: Cardiovascular;  Laterality: N/A;   LEFT HEART CATH AND CORONARY ANGIOGRAPHY N/A 07/13/2022   Procedure: LEFT HEART CATH AND CORONARY ANGIOGRAPHY;  Surgeon: Leonie Man, MD;  Location: [place] [facility];  Service: Cardiovascular;  Laterality: N/A;   LIGATION OF ARTERIOVENOUS  FISTULA Left 10/02/2021   Procedure: LIGATION OF ARTERIOVENOUS  FISTULA;  Surgeon: Evaristo Bury, MD;  Location: [facility];  Service: Vascular;  Laterality: Left;   NASAL SINUS SURGERY     RIGHT/LEFT HEART CATH AND CORONARY ANGIOGRAPHY N/A 07/19/2020   Procedure: RIGHT/LEFT HEART CATH AND CORONARY ANGIOGRAPHY;  Surgeon: Wellington Hampshire, MD;  Location: [place] [facility];  Service:  Cardiovascular;  Laterality: N/A;   RIGHT/LEFT HEART CATH AND CORONARY ANGIOGRAPHY N/A 05/26/2022   Procedure: RIGHT/LEFT HEART CATH AND CORONARY ANGIOGRAPHY;  Surgeon: Wellington Hampshire, MD;  Location: [place] [facility];  Service: Cardiovascular;  Laterality: N/A;     Social History   reports that he has never smoked. He has never used smokeless tobacco. He reports that he does not currently use alcohol. He reports that he does not use drugs.   Family  History   His family history includes Diabetes in his brother; Heart failure in his mother; Kidney failure in his mother; Other in his father.   Allergies No Known Allergies   Home Medications  Prior to Admission medications   Medication Sig Start Date End Date Taking? Authorizing Provider  amLODipine (NORVASC) 10 MG tablet Take 10 mg by mouth daily. 08/30/22  Yes [provider]  acetaminophen (TYLENOL) 325 MG tablet Take 650 mg by mouth every 6 (six) hours as needed.    [provider]  albuterol (PROVENTIL HFA) 108 (90 Base) MCG/ACT inhaler Inhale 2 puffs into the lungs every 4 (four) hours as needed for wheezing or shortness of breath. 07/07/22   Carrie Mew, MD  aspirin EC 81 MG tablet Take 1 tablet (81 mg total) by mouth daily. Swallow whole. 06/02/22   Gerrie Nordmann, NP  carvedilol (COREG) 25 MG tablet Take 1 tablet (25 mg total) by mouth 2 (two) times daily. 08/16/22 11/14/22  Enzo Bi, MD  clopidogrel (PLAVIX) 75 MG tablet Take 1 tablet (75 mg total) by mouth daily. 08/16/22 11/14/22  Enzo Bi, MD  escitalopram (LEXAPRO) 10 MG tablet Take 1 tablet (10 mg total) by mouth at bedtime. 05/19/22 08/14/22  Sidney Ace, MD  ezetimibe (ZETIA) 10 MG tablet Take 1 tablet (10 mg total) by mouth daily. 08/11/22   Gerrie Nordmann, NP  hydrALAZINE (APRESOLINE) 100 MG tablet Take 1 tablet (100 mg total) by mouth 3 (three) times daily. 08/16/22 11/14/22  Enzo Bi, MD  insulin detemir (LEVEMIR FLEXPEN) 100 UNIT/ML FlexPen  Inject 15 Units into the skin at bedtime. Patient taking differently: Inject 20 Units into the skin daily. 05/28/22 01/18/24  Annita Brod, MD  isosorbide mononitrate (IMDUR) 30 MG 24 hr tablet Take 1 tablet (30 mg total) by mouth in the morning and at bedtime. 08/11/22   Gerrie Nordmann, NP  losartan (COZAAR) 100 MG tablet Take 1 tablet (100 mg total) by mouth daily. 08/16/22 11/14/22  Enzo Bi, MD  nitroGLYCERIN (NITROSTAT) 0.4 MG SL tablet Place 1 tablet (0.4 mg total) under the tongue every 5 (five) minutes as needed for chest pain. 05/28/22   Annita Brod, MD  NOVOLOG FLEXPEN 100 UNIT/ML FlexPen Inject 0-15 Units into the skin 3 (three) times daily with meals. 0-15 units tid 0-5 units qhs 12/28/21   [provider]  Nutritional Supplements (FEEDING SUPPLEMENT, NEPRO CARB STEADY,) LIQD Take 237 mLs by mouth 3 (three) times daily between meals. 10/08/21   Ezekiel Slocumb, DO  pantoprazole (PROTONIX) 20 MG tablet Take 1 tablet (20 mg total) by mouth daily. 08/11/22   Gerrie Nordmann, NP  rosuvastatin (CRESTOR) 10 MG tablet Take 1 tablet (10 mg total) by mouth daily. 08/11/22   Gerrie Nordmann, NP  torsemide (DEMADEX) 20 MG tablet Take 1 tablet (20 mg total) by mouth every Monday, Wednesday, and Friday. On non-dialysis days. 08/16/22 11/14/22  Enzo Bi, MD  gabapentin (NEURONTIN) 300 MG capsule Take 1 capsule (300 mg total) by mouth at bedtime for 7 days. 06/07/21 08/18/21  Sidney Ace, MD  Scheduled Meds:  famotidine  20 mg Per Tube BID   midazolam       Continuous Infusions:  sodium chloride     fentaNYL infusion INTRAVENOUS 100 mcg/hr (09/02/2022 0440)   PRN Meds:.acetaminophen **OR** acetaminophen, dextrose, docusate sodium, fentaNYL, glycopyrrolate **OR** glycopyrrolate **OR** glycopyrrolate, midazolam, midazolam, polyethylene glycol, polyvinyl alcohol  Active Hospital Problem list     Assessment & Plan:  #  Out-of-Hospital PEA Cardiac Arrest  in the setting of suspected  Respiratory Arrest/Failure with total downtime approximately 30 minutes # Circulatory Shock  PMHx:CAD, s/p PCI/DES to LAD, HLD, HTN - Normothermia protocol - Vasopressors as needed to maintain MAP goal  -Trend HS Troponin until peaked  - Cardiology consult - Obtain 2D Echo   #Acute Hypoxic and Hypercapnic Respiratory Failure #Flash Pulmonary Edema - Full vent support, implement lung protective strategies - Plateau pressures less than 30 cm H20 - Wean FiO2 & PEEP as tolerated to maintain O2 sats >92% - Follow intermittent Chest X-ray & ABG as needed - Spontaneous Breathing Trials when respiratory parameters met and mental status permits - Implement VAP Bundle - Bronchodilators and Pulmicort nebs - CRRT for volume removal as below  #Acute on chronic HFrEF/mixed ischemic and nonischemic cardiomyopathy: EF 30% #Hypertensive Emergency with target organ damage - Likely secondary to inadequate volume removal during dialysis and uncontrolled hypertension.  - Start on Nicardipine gtt  - Pending CRRT for volume removal   # ESRD on HD # Hypokalemia # Anion gap metabolic acidosis with Lactic Acidosis -Trend Lactate - Monitor I&O's / urinary output - Follow BMP - Ensure adequate renal perfusion - Replace electrolytes as indicated - Nephrology consulted, CRRT ordered for volume removal   # Acute toxic metabolic encephalopathy # Suspected anoxic brain injury # Suspected Post-Hypoxic Myoclonus Patient noted with intermitted generalized abrupt, irregular contractions of muscles  - Obtain CTH and EEG for prognostication once stable - We will give Keppra load 1.5 g and continue maintenance 500 mg twice daily - keep sedation light as able though may be limited in setting of his myoclonus/possible sz activity - Seizure precautions - Neuro consult   Hyperglycemia in the setting of above PMHx: DM type 1  - CXR, UA negative, Blood  and urine Cultures for infection pending - Troponin  elevated, EKG  shows no ischemia - Will check Lipase for pancreatitis - Received Insulin (regular) 0.1u/kg (~10 units) IV x1. Continue Insulin drip, per protocol - Keep NPO - Glucose: q1h to titrate insulin - Lab monitoring: q2-4h BMP+Phosphorus+pH (ABG/VBG) to assess severity of acidemia - Aggressive  volume repletion  - Goal to normalize anion gap  and transition to subQ insulin - Diabetes coordinator consult  #Transaminitis ?early ischemic.  - Likely Shock Liver - trend     Best practice:  Diet:  NPO Pain/Anxiety/Delirium protocol (if indicated): Yes (RASS goal -1) VAP protocol (if indicated): Yes DVT prophylaxis: Subcutaneous Heparin GI prophylaxis: H2B Glucose control:  Insulin gtt Central venous access:  N/A Arterial line:  Yes, and it is still needed Foley:  Yes, and it is still needed Mobility:  bed rest  PT consulted: N/A Last date of multidisciplinary goals of care discussion [3/18] Code Status:  full code Disposition: ICU   = Goals of Care = Code Status Order: FULL  Primary Emergency Contact: Cammie Mcgee, Home Phone: [phone] Wishes to pursue full aggressive treatment and intervention options, including CPR and intubation, but goals of care will be addressed on going with family if that should become necessary.  Critical care time: 77 minutes        [name] DNP, CCRN, FNP-C, AGACNP-BC Acute Care & Family Nurse Practitioner [facility] PCCM on call pager [phone]

## 2022-09-04 NOTE — Progress Notes (Signed)
Patient extubated to comfort care, per Family and MD request.

## 2022-09-04 NOTE — Progress Notes (Signed)
September 04, 2022   Patient: Jimmy Olson  Date of Birth: 1960/08/22  Date of Visit: 09/10/2022      To Whom It May Concern:   Jimmy Olson was seen and treated in our ICU care center since 08/30/2022. Jimmy Olson  is not expected to have a meaningful recovery please excuse Jimmy Olson   Sincerely,  [facility]

## 2022-09-04 NOTE — Final Progress Note (Signed)
Patient accepted from PCCM. [facility] will pick up on 2022/09/16 @ 7 am.

## 2022-09-04 NOTE — Progress Notes (Signed)
[facility]  ROUNDING NOTE   Subjective:   Jimmy Olson is a 62 y.o. male with past medical history including diabetes, heart failure, CAD, and end stage renal disease on hemodialysis. Patient presents with shortness of breath and PEA arrest has been admitted for Acute pulmonary edema ([place]) [J81.0] Hyperglycemia [R73.9] Respiratory arrest before cardiac arrest ([place]) [I46.9, R09.2] Cardiac arrest with successful resuscitation [facility]) [I46.9]  Patient is known to our practice and receives outpatient dialysis treatments at [facility] on a TTS schedule. Chart review states that EMS was called to home due to sudden onset shortness of breath. EMS called and patient placed on NRB in route to ED. Patient became unresponsive in route and CPR initiated. Patient seen and evaluated at bedside in ICU. Due to increased fluid volume and critical condition, patient placed on CRRT overnight. Family has decided to withdraw care and resume comfort measures.   Objective:  Vital signs in last 24 hours:  Temp:  [90.7 F (32.6 C)-96.8 F (36 C)] 94.3 F (34.6 C) (03/18 0700) Pulse Rate:  [55-125] 100 (03/18 0700) Resp:  [0-27] 16 (03/18 0700) BP: (79-227)/(56-127) 205/97 (03/18 0230) SpO2:  [81 %-100 %] 81 % (03/18 0700) Arterial Line BP: (106-247)/(46-96) 188/66 (03/18 0700) FiO2 (%):  [40 %-100 %] 40 % (03/18 0410) Weight:  [59 kg] 59 kg (03/18 0300)  Weight change:  Filed Weights   08/31/2022 0300  Weight: 59 kg    Intake/Output: I/O last 3 completed shifts: In: 47.2 [I.V.:47.2] Out: -    Intake/Output this shift:  Total I/O In: 386.3 [I.V.:286.3; IV Piggyback:100] Out: 75 [Urine:75]  Physical Exam: General: Critically ill appearing  Head: Normocephalic, atraumatic.   Eyes: Anicteric  Lungs:  Cheyne-stokes  Heart: Regular rate and rhythm  Abdomen:  Soft, nontender  Extremities:  No peripheral edema.  Neurologic: Withdrawn  Skin: No lesions  Access: Rt chest  permcath    Basic Metabolic Panel: Recent Labs  Lab 09/15/2022 0120 09/08/2022 0121 09/14/2022 0437  NA 134*  --  135  137  K 4.4  --  4.2  4.2  CL 96*  --  99  99  CO2 21*  --  26  26  GLUCOSE 675*  --  378*  385*  BUN 46*  --  53*  55*  CREATININE 4.62*  --  4.69*  4.74*  CALCIUM 10.8*  --  10.0  10.2  MG  --  2.4 2.3  PHOS  --  8.9* 4.1     Liver Function Tests: Recent Labs  Lab 08/20/2022 0120 09/11/2022 0437  AST 231*  --   ALT 218*  --   ALKPHOS 113  --   BILITOT 0.9  --   PROT 5.8*  --   ALBUMIN 3.0* 3.2*    No results for input(s): "LIPASE", "AMYLASE" in the last 168 hours.  No results for input(s): "AMMONIA" in the last 168 hours.  CBC: Recent Labs  Lab 09/11/2022 0120 08/30/2022 0437  WBC 8.9 7.6  NEUTROABS 3.8  --   HGB 10.9* 10.5*  HCT 34.6* 30.6*  MCV 95.3 87.4  PLT 153 131*     Cardiac Enzymes: No results for input(s): "CKTOTAL", "CKMB", "CKMBINDEX", "TROPONINI" in the last 168 hours.  BNP: Invalid input(s): "POCBNP"  CBG: Recent Labs  Lab 08/23/2022 0102 09/12/2022 0144 09/04/22 0243  GLUCAP 596* >600* 71*     Microbiology: Results for orders placed or performed during the hospital encounter of 09/04/22  MRSA  Next Gen by PCR, Nasal     Status: None   Collection Time: 08/30/2022  2:39 AM   Specimen: Nasal Mucosa; Nasal Swab  Result Value Ref Range Status   MRSA by PCR Next Gen NOT DETECTED NOT DETECTED Final    Comment: (NOTE) The GeneXpert MRSA Assay (FDA approved for NASAL specimens only), is one component of a comprehensive MRSA colonization surveillance program. It is not intended to diagnose MRSA infection nor to guide or monitor treatment for MRSA infections. Test performance is not FDA approved in patients less than 31 years old. Performed at [facility], [address] [place], [place] [address]     Coagulation Studies: No results for input(s): "LABPROT", "INR" in the last 72 hours.  Urinalysis: Recent  Labs    09/09/2022 0120  COLORURINE YELLOW*  LABSPEC 1.012  PHURINE 7.0  GLUCOSEU >=500*  HGBUR NEGATIVE  BILIRUBINUR NEGATIVE  KETONESUR NEGATIVE  PROTEINUR >=300*  NITRITE NEGATIVE  LEUKOCYTESUR NEGATIVE      Imaging: CT HEAD WO CONTRAST (5MM)  Result Date: 08/23/2022 CLINICAL DATA:  Neuro deficit with stroke suspected EXAM: CT HEAD WITHOUT CONTRAST TECHNIQUE: Contiguous axial images were obtained from the base of the skull through the vertex without intravenous contrast. RADIATION DOSE REDUCTION: This exam was performed according to the departmental dose-optimization program which includes automated exposure control, adjustment of the mA and/or kV according to patient size and/or use of iterative reconstruction technique. COMPARISON:  07/10/2022 FINDINGS: Brain: Generalized poor gray-white differentiation with accentuation of dural reflections and decrease in subarachnoid spaces. No hemorrhage, hydrocephalus, or collection. No herniation. Vascular: No hyperdense vessel or unexpected calcification. Skull: Normal. Negative for fracture or focal lesion. Sinuses/Orbits: Partial bilateral mastoid opacification partial right mastoid opacification with negative nasopharynx. IMPRESSION: Findings of global anoxic injury.  No herniation or hydrocephalus. Electronically Signed   By: Jorje Guild M.D.   On: 09/06/2022 04:14   DG Abd Portable 1 View  Result Date: 08/23/2022 CLINICAL DATA:  Orogastric tube insertion EXAM: PORTABLE ABDOMEN - 1 VIEW COMPARISON:  None Available. FINDINGS: Side port of the orogastric tube is at the level of the gastroesophageal junction. Recommend advancing by 7 cm. IMPRESSION: Side port of the orogastric tube is at the level of the gastroesophageal junction. Recommend advancing by 7 cm. Electronically Signed   By: Ulyses Jarred M.D.   On: 08/23/2022 02:15   DG Chest Portable 1 View  Result Date: 08/20/2022 CLINICAL DATA:  Status post intubation. EXAM: PORTABLE CHEST 1  VIEW COMPARISON:  August 15, 2022 FINDINGS: An endotracheal tube is seen with its distal tip approximately 3.9 cm from the carina. Stable right-sided venous catheter positioning is noted. The cardiac silhouette is mildly enlarged and unchanged in size. Mild to moderate severity bilateral diffuse interstitial opacities are seen. There is no evidence of a pleural effusion or pneumothorax. The visualized skeletal structures are unremarkable. IMPRESSION: 1. Endotracheal tube positioning, as described above. 2. Stable cardiomegaly with mild to moderate severity pulmonary edema. Electronically Signed   By: Virgina Norfolk M.D.   On: 08/28/2022 01:42     Medications:    sodium chloride     fentaNYL infusion INTRAVENOUS 100 mcg/hr (09/02/2022 1027)    famotidine  20 mg Per Tube BID   midazolam       mouth rinse  15 mL Mouth Rinse 4 times per day   acetaminophen **OR** acetaminophen, dextrose, docusate sodium, fentaNYL, glycopyrrolate **OR** glycopyrrolate **OR** glycopyrrolate, midazolam, midazolam, mouth rinse, polyethylene glycol, polyvinyl alcohol  Assessment/ Plan:  Mr. Jimmy Olson is a 62 y.o.  male with past medical history including diabetes, heart failure, CAD, and end stage renal disease on hemodialysis. Patient presents with shortness of breath and has been admitted for Acute pulmonary edema ([place]) [J81.0] Hyperglycemia [R73.9] Respiratory arrest before cardiac arrest ([place]) [I46.9, R09.2] Cardiac arrest with successful resuscitation ([place]) [I46.9]  [facility]/TTS/Rt chest permcath  End stage renal disease on hemodialysis. Last treatment received on Saturday. CRRT initiated on ICU admit due to instability.   2. Anemia of chronic kidney disease Lab Results  Component Value Date   HGB 10.5 (L) 09/11/2022    Patient receives Mircera at outpatient clinic.   3. Secondary Hyperparathyroidism: with outpatient labs: PTH 376, phosphorus 3.6, calcium 8.4 on 08/01/22.   Lab Results   Component Value Date   CALCIUM 10.0 09/15/2022   CALCIUM 10.2 09/11/2022   CAION 1.19 07/13/2022   PHOS 4.1 08/20/2022    Will continue to monitor bone minerals during this admission.   4. Hypertension with chronic kidney disease. Home regimen includes carvedilol, ezetimibe, hydralazine, isosorbide, losartan and torsemide.   5. Diabetes mellitus type II with chronic kidney disease/renal manifestations: insulin dependent. Home regimen includes Levemir and novolog. Most recent hemoglobin A1c is 10.1 on 05/17/22.   Family has decided to withdraw care and proceed with comfort measures.    LOS: 0   3/18/202411:42 AM

## 2022-09-04 NOTE — Progress Notes (Signed)
Received report from [employer] Wife and daughter at bedside supporting patient. Comfort tray provided to patent's family at 0800. All needs met for patient and family at this time. Call light working and available for family to notify RN of any needs. Will continue to monitor closely.

## 2022-09-04 NOTE — Progress Notes (Signed)
  September 04, 2022  Patient: Jimmy Olson  Date of Birth: 1960-10-07  Date of Visit: 08/21/2022    To Whom It May Concern:  Ren Gladys was seen and treated in our ICU care center since 08/26/2022. CARDEL WERNETTE  is not expected to have a meaningful recovery please excuse Cammie Mcgee  Sincerely,  [facility]

## 2022-09-04 NOTE — Procedures (Signed)
Arterial Catheter Insertion Procedure Note  Jimmy Olson  SX:[id number]  July 19, 1960  Date:09/04/2022  Time:6:56 AM    Provider Performing: Karen Kays    Procedure: Insertion of Arterial Line [phone]) with US guidance [id number])   Indication(s) Blood pressure monitoring and/or need for frequent ABGs  Consent Unable to obtain consent due to emergent nature of procedure.  Anesthesia None  Time Out Verified patient identification, verified procedure, site/side was marked, verified correct patient position, special equipment/implants available, medications/allergies/relevant history reviewed, required imaging and test results available.  Sterile Technique Maximal sterile technique including full sterile barrier drape, hand hygiene, sterile gown, sterile gloves, mask, hair covering, sterile ultrasound probe cover (if used).  Procedure Description Area of catheter insertion was cleaned with chlorhexidine and draped in sterile fashion. With real-time ultrasound guidance an arterial catheter was placed into the right radial artery.  Appropriate arterial tracings confirmed on monitor.    Complications/Tolerance None; patient tolerated the procedure well.  EBL Minimal  Specimen(s) None   [name], DNP, CCRN, FNP-C, AGACNP-BC Acute Care & Family Nurse Practitioner  [facility]  See Amion for personal pager PCCM on call pager [phone] until 7 am

## 2022-09-04 NOTE — ED Provider Notes (Signed)
[facility] Provider Note    Event Date/Time   First MD Initiated Contact with Patient 09/17/2022 0159     (approximate)   History   Cardiac Arrest   HPI  Jimmy Olson is a 62 y.o. male with history of coronary artery disease, CHF, end-stage renal disease on hemodialysis last dialyzed on Saturday, hypertension, hyperlipidemia, diabetes who presents to the emergency department in cardiac arrest.  EMS reports that they were called out for sudden onset shortness of breath and found patient to be hypoxic on room air at 84%.  Patient was placed on a nonrebreather.  EMS reportedly gave breathing treatments.  About 4 minutes prior to arrival to the ED patient became unresponsive and lost pulses.  EMS reports he was in PEA.  They started CPR and began assisted ventilations via bag-valve-mask but no advanced airway was placed and no medications given.   History provided by EMS, wife using Spanish interpreter.    Past Medical History:  Diagnosis Date   Anemia    Anxiety and depression    ATN (acute tubular necrosis) (HCC)    CAD (coronary artery disease)    a. 06/2020 Cath: Nonobs dzs; b. 05/2021 NSTEMI/Cath: Nonobs dzs->demand isch; c. 05/2022 Cath: LM mild dzs, LAD 40ost/p, 79m, D1 70, RI mild dzs, LCX small, mild dzs, RCA 20p, 100m/d, RPDA 40; d. 05/2022 Staged PCI: Orbital atherectomy of mLAD  (2.75x48 Synergy XD DES).   Chronic HFrEF (heart failure with reduced ejection fraction) ([place])    a. 08/2019 Echo: EF 45-50%; b. 06/2020 Echo: EF 30-35%; c. 04/2022 Echo: EF 40-45%, mild LVH, small apical region of noncompaction, GrII DD, nl RV fxn.   DVT of axillary vein, acute left ([place]) 05/2021   a.) Tx'd with apixaban   ESRD (end stage renal disease) on dialysis [facility])    a.) M-W-F   History of 2019 novel coronavirus disease (COVID-19) 03/06/2020   a.) s/p Tx with monoclonal Ab infusion   HLD (hyperlipidemia)    Hyperkalemia 12/23/2020   Hypertension    Left carotid  artery stenosis 08/18/2021   a.) Carotid Doppler [id number]: [id number] LICA stenosis   Long term current use of anticoagulant    a.) apixaban for DVT   Mixed Ischemic & Nonischemic Cardiomyopathy    a. 08/2019 Echo: EF 45-50%; b. 06/2020 Echo: EF 30-35%; c. 04/2022 Echo: EF 40-45%, mild LVH, small apical region of noncompaction, GrII DD, nl RV fxn.   NSTEMI (non-ST elevated myocardial infarction) ([place]) 07/15/2020   Sepsis ([place])    Suicidal ideations    TIA (transient ischemic attack) 06/2021   Type 2 diabetes mellitus treated with insulin [facility])     Past Surgical History:  Procedure Laterality Date   APPENDECTOMY     AV FISTULA PLACEMENT Left 09/29/2021   Procedure: ARTERIOVENOUS (AV) FISTULA CREATION ( RADIAL CEPHALIC);  Surgeon: Algernon Huxley, MD;  Location: [facility] ORS;  Service: Vascular;  Laterality: Left;   CARDIAC CATHETERIZATION     CORONARY ATHERECTOMY N/A 06/07/2022   Procedure: CORONARY ATHERECTOMY;  Surgeon: Wellington Hampshire, MD;  Location: [place] CV LAB;  Service: Cardiovascular;  Laterality: N/A;   CORONARY STENT INTERVENTION N/A 06/07/2022   Procedure: CORONARY STENT INTERVENTION;  Surgeon: Wellington Hampshire, MD;  Location: [place] CV LAB;  Service: Cardiovascular;  Laterality: N/A;   DIALYSIS/PERMA CATHETER INSERTION N/A 06/02/2021   Procedure: DIALYSIS/PERMA CATHETER INSERTION;  Surgeon: Algernon Huxley, MD;  Location: [place] CV LAB;  Service: Cardiovascular;  Laterality: N/A;   INTRAVASCULAR IMAGING/OCT N/A 06/07/2022   Procedure: INTRAVASCULAR IMAGING/OCT;  Surgeon: Wellington Hampshire, MD;  Location: [place] CV LAB;  Service: Cardiovascular;  Laterality: N/A;   LEFT HEART CATH AND CORONARY ANGIOGRAPHY N/A 06/03/2021   Procedure: LEFT HEART CATH AND CORONARY ANGIOGRAPHY;  Surgeon: Wellington Hampshire, MD;  Location: [place] CV LAB;  Service: Cardiovascular;  Laterality: N/A;   LEFT HEART CATH AND CORONARY ANGIOGRAPHY N/A 07/13/2022   Procedure: LEFT HEART CATH  AND CORONARY ANGIOGRAPHY;  Surgeon: Leonie Man, MD;  Location: [place] CV LAB;  Service: Cardiovascular;  Laterality: N/A;   LIGATION OF ARTERIOVENOUS  FISTULA Left 10/02/2021   Procedure: LIGATION OF ARTERIOVENOUS  FISTULA;  Surgeon: Evaristo Bury, MD;  Location: [facility] ORS;  Service: Vascular;  Laterality: Left;   NASAL SINUS SURGERY     RIGHT/LEFT HEART CATH AND CORONARY ANGIOGRAPHY N/A 07/19/2020   Procedure: RIGHT/LEFT HEART CATH AND CORONARY ANGIOGRAPHY;  Surgeon: Wellington Hampshire, MD;  Location: [place] CV LAB;  Service: Cardiovascular;  Laterality: N/A;   RIGHT/LEFT HEART CATH AND CORONARY ANGIOGRAPHY N/A 05/26/2022   Procedure: RIGHT/LEFT HEART CATH AND CORONARY ANGIOGRAPHY;  Surgeon: Wellington Hampshire, MD;  Location: [place] CV LAB;  Service: Cardiovascular;  Laterality: N/A;    MEDICATIONS:  Prior to Admission medications   Medication Sig Start Date End Date Taking? Authorizing Provider  acetaminophen (TYLENOL) 325 MG tablet Take 650 mg by mouth every 6 (six) hours as needed.    [provider]  albuterol (PROVENTIL HFA) 108 (90 Base) MCG/ACT inhaler Inhale 2 puffs into the lungs every 4 (four) hours as needed for wheezing or shortness of breath. 07/07/22   Carrie Mew, MD  amLODipine (NORVASC) 5 MG tablet Take 1 tablet (5 mg total) by mouth daily. 08/16/22 11/14/22  Enzo Bi, MD  aspirin EC 81 MG tablet Take 1 tablet (81 mg total) by mouth daily. Swallow whole. 06/02/22   Gerrie Nordmann, NP  carvedilol (COREG) 25 MG tablet Take 1 tablet (25 mg total) by mouth 2 (two) times daily. 08/16/22 11/14/22  Enzo Bi, MD  clopidogrel (PLAVIX) 75 MG tablet Take 1 tablet (75 mg total) by mouth daily. 08/16/22 11/14/22  Enzo Bi, MD  escitalopram (LEXAPRO) 10 MG tablet Take 1 tablet (10 mg total) by mouth at bedtime. 05/19/22 08/14/22  Sidney Ace, MD  ezetimibe (ZETIA) 10 MG tablet Take 1 tablet (10 mg total) by mouth daily. 08/11/22   Gerrie Nordmann, NP   hydrALAZINE (APRESOLINE) 100 MG tablet Take 1 tablet (100 mg total) by mouth 3 (three) times daily. 08/16/22 11/14/22  Enzo Bi, MD  insulin detemir (LEVEMIR FLEXPEN) 100 UNIT/ML FlexPen Inject 15 Units into the skin at bedtime. Patient taking differently: Inject 20 Units into the skin daily. 05/28/22 01/18/24  Annita Brod, MD  isosorbide mononitrate (IMDUR) 30 MG 24 hr tablet Take 1 tablet (30 mg total) by mouth in the morning and at bedtime. 08/11/22   Gerrie Nordmann, NP  losartan (COZAAR) 100 MG tablet Take 1 tablet (100 mg total) by mouth daily. 08/16/22 11/14/22  Enzo Bi, MD  nitroGLYCERIN (NITROSTAT) 0.4 MG SL tablet Place 1 tablet (0.4 mg total) under the tongue every 5 (five) minutes as needed for chest pain. 05/28/22   Annita Brod, MD  NOVOLOG FLEXPEN 100 UNIT/ML FlexPen Inject 0-15 Units into the skin 3 (three) times daily with meals. 0-15 units tid 0-5 units qhs 12/28/21   [provider]  Nutritional Supplements (FEEDING SUPPLEMENT, NEPRO CARB STEADY,) LIQD Take 237 mLs by mouth 3 (three) times daily between meals. 10/08/21   Ezekiel Slocumb, DO  pantoprazole (PROTONIX) 20 MG tablet Take 1 tablet (20 mg total) by mouth daily. 08/11/22   Gerrie Nordmann, NP  rosuvastatin (CRESTOR) 10 MG tablet Take 1 tablet (10 mg total) by mouth daily. 08/11/22   Gerrie Nordmann, NP  torsemide (DEMADEX) 20 MG tablet Take 1 tablet (20 mg total) by mouth every Monday, Wednesday, and Friday. On non-dialysis days. 08/16/22 11/14/22  Enzo Bi, MD  gabapentin (NEURONTIN) 300 MG capsule Take 1 capsule (300 mg total) by mouth at bedtime for 7 days. 06/07/21 08/18/21  Sidney Ace, MD    Physical Exam   Triage Vital Signs: ED Triage Vitals  Enc Vitals Group     BP 08/31/2022 0116 (!) 227/127     Pulse Rate 08/25/2022 0112 (!) 124     Resp 08/23/2022 0116 (!) 25     Temp 08/19/2022 0137 (!) 90.7 F (32.6 C)     Temp src --      SpO2 08/29/2022 0111 100 %     Weight --      Height --       Head Circumference --      Peak Flow --      Pain Score --      Pain Loc --      Pain Edu? --      Excl. in [place]? --     Most recent vital signs: Vitals:   08/25/2022 0400 08/21/2022 0500  BP:    Pulse: 91 80  Resp: (!) 22 (!) 0  Temp: (!) 96.6 F (35.9 C) (!) 95.9 F (35.5 C)  SpO2: 100% 99%    CONSTITUTIONAL: Patient is unresponsive, GCS 3 HEAD: Normocephalic, atraumatic EYES: Conjunctivae clear, pupils appear approximately 4 mm bilaterally and nonreactive ENT: normal nose; moist mucous membranes CARD: Patient is pulseless RESP: Patient is not actively breathing.  He is receiving assisted ventilations through bag-valve-mask and has rhonchorous breath sounds with frothy sputum coming out of his mouth and nose. ABD/GI: Non-distended, soft BACK: The back appears normal EXT: No edema SKIN: Slightly cool to touch NEURO: GCS 3   ED Results / Procedures / Treatments   LABS: (all labs ordered are listed, but only abnormal results are displayed) Labs Reviewed  CBC WITH DIFFERENTIAL/PLATELET - Abnormal; Notable for the following components:      Result Value   RBC 3.63 (*)    Hemoglobin 10.9 (*)    HCT 34.6 (*)    Lymphs Abs 4.4 (*)    Abs Immature Granulocytes 0.08 (*)    All other components within normal limits  COMPREHENSIVE METABOLIC PANEL - Abnormal; Notable for the following components:   Sodium 134 (*)    Chloride 96 (*)    CO2 21 (*)    Glucose, Bld 675 (*)    BUN 46 (*)    Creatinine, Ser 4.62 (*)    Calcium 10.8 (*)    Total Protein 5.8 (*)    Albumin 3.0 (*)    AST 231 (*)    ALT 218 (*)    GFR, Estimated 14 (*)    Anion gap 17 (*)    All other components within normal limits  BRAIN NATRIURETIC PEPTIDE - Abnormal; Notable for the following components:   B Natriuretic Peptide 2,772.9 (*)    All other components within normal limits  LACTIC ACID,  PLASMA - Abnormal; Notable for the following components:   Lactic Acid, Venous >9.0 (*)    All other  components within normal limits  LACTIC ACID, PLASMA - Abnormal; Notable for the following components:   Lactic Acid, Venous 2.0 (*)    All other components within normal limits  URINALYSIS, COMPLETE (UACMP) WITH MICROSCOPIC - Abnormal; Notable for the following components:   Color, Urine YELLOW (*)    APPearance CLOUDY (*)    Glucose, UA >=500 (*)    Protein, ur >=300 (*)    Bacteria, UA FEW (*)    All other components within normal limits  BLOOD GAS, ARTERIAL - Abnormal; Notable for the following components:   pH, Arterial 7.15 (*)    pCO2 arterial 69 (*)    pO2, Arterial 365 (*)    Acid-base deficit 5.6 (*)    All other components within normal limits  PHOSPHORUS - Abnormal; Notable for the following components:   Phosphorus 8.9 (*)    All other components within normal limits  CBC - Abnormal; Notable for the following components:   RBC 3.50 (*)    Hemoglobin 10.5 (*)    HCT 30.6 (*)    Platelets 131 (*)    All other components within normal limits  GLUCOSE, CAPILLARY - Abnormal; Notable for the following components:   Glucose-Capillary 504 (*)    All other components within normal limits  BASIC METABOLIC PANEL - Abnormal; Notable for the following components:   Glucose, Bld 385 (*)    BUN 55 (*)    Creatinine, Ser 4.74 (*)    GFR, Estimated 13 (*)    All other components within normal limits  CBG MONITORING, ED - Abnormal; Notable for the following components:   Glucose-Capillary 596 (*)    All other components within normal limits  CBG MONITORING, ED - Abnormal; Notable for the following components:   Glucose-Capillary >600 (*)    All other components within normal limits  TROPONIN I (HIGH SENSITIVITY) - Abnormal; Notable for the following components:   Troponin I (High Sensitivity) 267 (*)    All other components within normal limits  TROPONIN I (HIGH SENSITIVITY) - Abnormal; Notable for the following components:   Troponin I (High Sensitivity) 371 (*)    All other  components within normal limits  MRSA NEXT GEN BY PCR, NASAL  URINE DRUG SCREEN, QUALITATIVE ([facility] ONLY)  ETHANOL  MAGNESIUM  PROCALCITONIN  BETA-HYDROXYBUTYRIC ACID  BLOOD GAS, ARTERIAL  MAGNESIUM  APTT  RENAL FUNCTION PANEL  OSMOLALITY     EKG:  EKG Interpretation  Date/Time:  Monday September 04 2022 01:04:20 EDT Ventricular Rate:  76 PR Interval:  161 QRS Duration: 116 QT Interval:  428 QTC Calculation: 417 R Axis:   56 Text Interpretation: Sinus rhythm Multiple premature complexes, vent & supraven Nonspecific intraventricular conduction delay Nonspecific T abnormalities, lateral leads Borderline ST elevation, anterior leads Baseline wander in lead(s) V4 Confirmed by Pryor Curia [id number]) on 09/09/2022 4:31:15 AM        EKG Interpretation  Date/Time:  Monday September 04 2022 01:12:41 EDT Ventricular Rate:  128 PR Interval:  169 QRS Duration: 108 QT Interval:  289 QTC Calculation: 422 R Axis:   61 Text Interpretation: Sinus tachycardia Abnormal R-wave progression, early transition Repol abnrm suggests ischemia, diffuse leads Baseline wander in lead(s) II III aVF V2 V4 Confirmed by Pryor Curia [phone]) on 09/13/2022 4:31:40 AM          EKG Interpretation  Date/Time:  Monday September 04 2022 01:27:43 EDT Ventricular Rate:  45 PR Interval:  126 QRS Duration: 100 QT Interval:  497 QTC Calculation: 430 R Axis:   76 Text Interpretation: Sinus bradycardia Repol abnrm, global ischemia, diffuse leads Baseline wander in lead(s) I II aVR V3 Confirmed by Pryor Curia [id number]) on 09/10/2022 4:32:01 AM         EKG Interpretation  Date/Time:  Monday September 04 2022 01:29:09 EDT Ventricular Rate:  54 PR Interval:  126 QRS Duration: 102 QT Interval:  486 QTC Calculation: 461 R Axis:   73 Text Interpretation: Junctional rhythm Repol abnrm suggests ischemia, diffuse leads Confirmed by Pryor Curia [id number]) on 08/26/2022 4:32:23 AM           RADIOLOGY: My personal  review and interpretation of imaging: Chest x-ray shows diffuse pulmonary edema, cardiomegaly.  I have personally reviewed all radiology reports.   CT HEAD WO CONTRAST (5MM)  Result Date: 08/18/2022 CLINICAL DATA:  Neuro deficit with stroke suspected EXAM: CT HEAD WITHOUT CONTRAST TECHNIQUE: Contiguous axial images were obtained from the base of the skull through the vertex without intravenous contrast. RADIATION DOSE REDUCTION: This exam was performed according to the departmental dose-optimization program which includes automated exposure control, adjustment of the mA and/or kV according to patient size and/or use of iterative reconstruction technique. COMPARISON:  07/10/2022 FINDINGS: Brain: Generalized poor gray-white differentiation with accentuation of dural reflections and decrease in subarachnoid spaces. No hemorrhage, hydrocephalus, or collection. No herniation. Vascular: No hyperdense vessel or unexpected calcification. Skull: Normal. Negative for fracture or focal lesion. Sinuses/Orbits: Partial bilateral mastoid opacification partial right mastoid opacification with negative nasopharynx. IMPRESSION: Findings of global anoxic injury.  No herniation or hydrocephalus. Electronically Signed   By: Jorje Guild M.D.   On: 08/31/2022 04:14   DG Abd Portable 1 View  Result Date: 09/14/2022 CLINICAL DATA:  Orogastric tube insertion EXAM: PORTABLE ABDOMEN - 1 VIEW COMPARISON:  None Available. FINDINGS: Side port of the orogastric tube is at the level of the gastroesophageal junction. Recommend advancing by 7 cm. IMPRESSION: Side port of the orogastric tube is at the level of the gastroesophageal junction. Recommend advancing by 7 cm. Electronically Signed   By: Ulyses Jarred M.D.   On: 08/25/2022 02:15   DG Chest Portable 1 View  Result Date: 09/12/2022 CLINICAL DATA:  Status post intubation. EXAM: PORTABLE CHEST 1 VIEW COMPARISON:  August 15, 2022 FINDINGS: An endotracheal tube is seen with its  distal tip approximately 3.9 cm from the carina. Stable right-sided venous catheter positioning is noted. The cardiac silhouette is mildly enlarged and unchanged in size. Mild to moderate severity bilateral diffuse interstitial opacities are seen. There is no evidence of a pleural effusion or pneumothorax. The visualized skeletal structures are unremarkable. IMPRESSION: 1. Endotracheal tube positioning, as described above. 2. Stable cardiomegaly with mild to moderate severity pulmonary edema. Electronically Signed   By: Virgina Norfolk M.D.   On: 09/01/2022 01:42     PROCEDURES:  Critical Care performed: Yes, see critical care procedure note(s)   CRITICAL CARE Performed by: Cyril Mourning Kamaree Wheatley   Total critical care time: 40 minutes  Critical care time was exclusive of separately billable procedures and treating other patients.  Critical care was necessary to treat or prevent imminent or life-threatening deterioration.  Critical care was time spent personally by me on the following activities: development of treatment plan with patient and/or surrogate as well as nursing, discussions with consultants, evaluation of patient's response to treatment, examination of patient,  obtaining history from patient or surrogate, ordering and performing treatments and interventions, ordering and review of laboratory studies, ordering and review of radiographic studies, pulse oximetry and re-evaluation of patient's condition.    Cardiopulmonary Resuscitation (CPR) Procedure Note Directed/Performed by: Pryor Curia I personally directed ancillary staff and/or performed CPR in an effort to regain return of spontaneous circulation and to maintain cardiac, neuro and systemic perfusion.     INTUBATION Performed by: Pryor Curia  Required items: required blood products, implants, devices, and special equipment available Patient identity confirmed: provided demographic data and hospital-assigned identification  number Time out: Immediately prior to procedure a "time out" was called to verify the correct patient, procedure, equipment, support staff and site/side marked as required.  Indications: Cardiac arrest  Intubation method: Glidescope Laryngoscopy   Preoxygenation: BVM  Sedatives: none Paralytic: none  Tube Size: 7.5 cuffed  Post-procedure assessment: chest rise and ETCO2 monitor Breath sounds: equal and absent over the epigastrium Tube secured with: ETT holder Chest x-ray interpreted by radiologist and me.  Chest x-ray findings: endotracheal tube in appropriate position  Patient tolerated the procedure well with no immediate complications.    Marland Kitchen1-3 Lead EKG Interpretation  Performed by: Henrry Feil, Delice Bison, DO Authorized by: Lorisa Scheid, Delice Bison, DO     Interpretation: abnormal     ECG rate:  53   ECG rate assessment: bradycardic     Rhythm: sinus bradycardia     Ectopy: none     Conduction: normal       IMPRESSION / MDM / ASSESSMENT AND PLAN / ED COURSE  I reviewed the triage vital signs and the nursing notes.    Patient here with respiratory arrest leading to cardiac arrest.  The patient is on the cardiac monitor to evaluate for evidence of arrhythmia and/or significant heart rate changes.   DIFFERENTIAL DIAGNOSIS (includes but not limited to):   Respiratory arrest likely secondary to flash pulmonary edema.  Differential also includes COPD exacerbation, CHF exacerbation, volume overload secondary to patient being on dialysis, pneumonia, pneumothorax, ACS, PE.   Patient's presentation is most consistent with acute presentation with potential threat to life or bodily function.   PLAN: Patient here in cardiac arrest.  Immediately upon arrival CPR continued.  Peripheral IVs were placed and patient received a total of 4 rounds of epinephrine, 3 A of bicarb, 2 g of calcium.  Patient was in PEA.  We did have a brief return of spontaneous circulation for about 2 to 3 minutes  during her resuscitation but patient quickly became bradycardic and lost pulses again.  Patient was initially intubated using a 7.5 endotracheal tube and it was noticed that he had a significant cuff leak.  We were unable to resolve this and I would not build to bring up his sats quickly so the tube was exchanged for an 8.0 endotracheal tube and at that time he had sats of 100% while being bagged.  The 7.5 endotracheal tube was then found to have a ruptured balloon.  Once patient oxygenating well we had pulses back.  He did have an episode of becoming bradycardic in the 40s and received 1 mg of IV atropine.  Initially patient was hypertensive but then became hypotensive requiring IV Levophed.  Exam concerning given patient continues to have GCS of 3 and is not requiring any sedation.  Pupils however not fixed and dilated but there is no cough, gag or corneal response.  Labs, urine pending.  Chest x-ray shows diffuse edema.  Will discuss  with critical care and nephrology.   MEDICATIONS GIVEN IN ED: Medications  docusate sodium (COLACE) capsule 100 mg (has no administration in time range)  polyethylene glycol (MIRALAX / GLYCOLAX) packet 17 g (has no administration in time range)  famotidine (PEPCID) tablet 20 mg (0 mg Per Tube Hold 09/03/2022 0217)  fentaNYL 2510mcg in NS 238mL (19mcg/ml) infusion-PREMIX (100 mcg/hr Intravenous Infusion Verify 09/11/2022 0440)  fentaNYL (SUBLIMAZE) bolus via infusion 50-100 mcg (has no administration in time range)  dextrose 50 % solution 0-50 mL (has no administration in time range)  midazolam (VERSED) injection 2 mg (has no administration in time range)  midazolam (VERSED) 2 MG/2ML injection (has no administration in time range)  0.9 %  sodium chloride infusion (has no administration in time range)  acetaminophen (TYLENOL) tablet 650 mg (has no administration in time range)    Or  acetaminophen (TYLENOL) suppository 650 mg (has no administration in time range)   glycopyrrolate (ROBINUL) tablet 1 mg (has no administration in time range)    Or  glycopyrrolate (ROBINUL) injection 0.2 mg (has no administration in time range)    Or  glycopyrrolate (ROBINUL) injection 0.2 mg (has no administration in time range)  polyvinyl alcohol (LIQUIFILM TEARS) 1.4 % ophthalmic solution 1 drop (has no administration in time range)  EPINEPHrine (ADRENALIN) 1 MG/10ML injection (1 mg Intravenous Given 08/23/2022 0055)  calcium chloride injection (1 g Intravenous Given 08/31/2022 0102)  sodium bicarbonate injection (50 mEq Intravenous Given 09/14/2022 0111)  EPINEPHrine (ADRENALIN) 1 MG/10ML injection (1 mg Intravenous Given 08/20/2022 0109)  atropine 1 MG/10ML injection (1 mg Intravenous Given 08/26/2022 0127)  insulin aspart (novoLOG) injection 10 Units (10 Units Intravenous Given 09/08/2022 0215)  propofol (DIPRIVAN) 1000 MG/100ML infusion (0 mcg/kg/min  Stopped 08/20/2022 0210)  levETIRAcetam (KEPPRA) IVPB 1000 mg/100 mL premix (1,000 mg Intravenous New Bag/Given 08/26/2022 0439)  midazolam (VERSED) injection 4 mg (4 mg Intravenous Given 09/03/2022 0340)     ED COURSE: Patient's blood sugar is reading high.  Will start IV insulin.  Beta hydroxybutyric acid level is normal.  No ketonuria.  Normal bicarb on blood gas.  Doubt DKA.  ABG shows pH of 7.15 and a pCO2 of 69 consistent with a respiratory acidosis.  We have adjusted his ventilator settings and turned down his FiO2.  Electrolytes within normal limits other than elevated phosphorous level.  Hemoglobin 10.9 today.  No leukocytosis.  Troponin is 267 and BNP is greater than 2700.  I did discuss with nephrology for orders for dialysis to help with his acute pulmonary edema.  Holding IVF at this time.  Lactate greater than 9 consistent with cardiac arrest.  No recent infectious symptoms per wife.  Procalcitonin is only 0.13.  Urine does not appear infected.  Chest x-ray does not show any sign of infection.  No indication for  antibiotics.  Patient's wife updated using Spanish interpreter with chaplain present.   CONSULTS: Discussed with [name], NP with critical care for admission.  Discussed with Dr. Juleen China for dialysis.   OUTSIDE RECORDS REVIEWED: Reviewed last cardiology note on 08/11/2022.  LHC 07/13/22:    Ost LAD to Prox LAD lesion is 50% stenosed.  Prox LAD lesion is 40% stenosed.   Prox LAD to Mid LAD stent is widely patent with minimal-5% stenosed.   1st Diag lesion is 45% stenosed.   Prox RCA lesion is 20% stenosed.  Mid RCA lesion is 30% stenosed.  Dist RCA lesion is 40% stenosed.  RPDA lesion is 40% stenosed.  LV end diastolic pressure is severely elevated.   There is no aortic valve stenosis.   Post-Cath Diagnoses Widely patent LAD stent with otherwise minimal CAD from last catheterization Severely elevated LVEDP of 28 mmHg indicating pressure overload (acute On Chronic Combined Systolic and Diastolic Heart Failure). Suspect this is the etiology for elevated troponin levels. Would not call this a non-ST elevation myocardial infarction.      Recommendations: Systemic blood pressure control and volume removal with dialysis.     FINAL CLINICAL IMPRESSION(S) / ED DIAGNOSES   Final diagnoses:  Acute pulmonary edema ([place])  Respiratory arrest before cardiac arrest ([place])  Hyperglycemia  Respiratory acidosis     Rx / DC Orders   ED Discharge Orders     None        Note:  This document was prepared using Dragon voice recognition software and may include unintentional dictation errors.   Jandy Brackens, Delice Bison, DO 09/03/2022 [phone]

## 2022-09-04 NOTE — Progress Notes (Signed)
Per Critical care NP Benjamine Mola we are going to transition to comfort care and not begin CRRT and Insulin drip.

## 2022-09-18 NOTE — Progress Notes (Addendum)
1901 patient on room air family at bedside day RN reported off that patient is comfort care extubated on 08/23/2022 around 630am

## 2022-09-18 NOTE — Death Summary Note (Signed)
DEATH SUMMARY   Patient Details  Name: Jimmy Olson MRN: [id number] DOB: 09-Aug-1960  Admission/Discharge Information   Admit Date:  2022-09-25  Date of Death: Date of Death: 09-26-2022  Time of Death: Time of Death: 0117  Length of Stay: 1  Referring Physician: [facility] [place]   Reason(s) for Hospitalization  Out-of-Hospital PEA Cardiac Arrest Acute Hypoxic & Hypercapnic Respiratory Failure Flash Pulmonary Edema Circulatory Shock Acute on Chronic HFrEF Hypertensive Emergency Post Anoxic Brain Injury Seizures/Post-Hypoxic Myoclonus DKA ESRD on HD Hypokalemia Anion Gap Metabolic Acidosis Lactic acidosis Transaminitis  Diagnoses  Preliminary cause of death:  Out-of-Hospital PEA Cardiac Arrest Secondary Diagnoses (including complications and co-morbidities):  Principal Problem:   Cardiac arrest with successful resuscitation ([place]) Acute Hypoxic & Hypercapnic Respiratory Failure Flash Pulmonary Edema Circulatory Shock Acute on Chronic HFrEF Hypertensive Emergency Post Anoxic Brain Injury Seizures/Post-Hypoxic Myoclonus DKA ESRD on HD Hypokalemia Anion Gap Metabolic Acidosis Lactic acidosis Transaminitis  Brief Hospital Course (including significant findings, care, treatment, and services provided and events leading to death)  Jimmy Olson is a 62 y.o. male with pertinent PMHx of CAD s/p LAD stenting in December 2023, mixed ischemic and nonischemic cardiomyopathy with an EF of 40-45% November 2023, chronic HFrEF, HTN, HLD, T2DM, ESRD on HD, left upper extremity DVT previously on Eliquis, and TIA who presented to the ED postcardiac arrest   Per ED and EMS reports, patient complained of shortness of breath 30 minutes prior to EMS arrival.  On EMS arrival, patient was noted to be in respiratory distress with audible wheezing and rhonchi throughout the lung fields.  He was treated with multiple doses of DuoNebs.  Shortly after patient started forming at  the mouth and became unresponsive.  He was placed on the monitor which showed bradycardia with sats in the low 80s on nonrebreathe.  Per ED reports,  patient  went into PEA cardiac arrest  4 minutes prior to arrival to the ED and CPR initiated with assisted ventilations via bag-valve-mask but no advanced airway was placed and no medications given. On review of his chart, patient was recently discharged following admission with acute on chronic hypoxic respiratory failure secondary to flash pulmonary edema, hypertensive urgency and NSTEMI status post diagnostic cardiac catheterization.   ED Course: On arrival pt was in PEA so CPR was continued and pt received a total of 4 rounds of Epi, 3 amps of Bicarb and 2 g of calcium prior to ROSC. Initial vital signs showed HR of 124 beats/minute, BP 227/127 mm Hg, the RR 25 breaths/minute, and the oxygen saturation 100 % on the vent  and a temperature of 90.38F (32.6C). Patient was unresponsive with GCS of 3. He was emergently intubated in the ED for airway protection. Patient became bradycardic and hypotensive post intubation and was given Atropine and started on Levophed. PCCM consulted for admission.   Pertinent Labs/Diagnostics Findings: Na+/ K+: 134/4.4 Glucose 675 BUN/Cr.:46/4.62  AST/ALT:231/218, CO2:21, Anion Gap 17 WBC: 8.9 Hgb/Hct:10.9/34.6  PCT: 0.13 Lactic acid: >9Troponin:267  BNP: 2772.9  pO2 365; pCO2 69; pH 7.15;  HCO3 24, %O2 Sat 99.3.  CXR> Stable cardiomegaly with mild to moderate severity pulmonary edema.CTH> pending Medication administered: Insulin for hyperglycemia  PCCM asked to admit for further workup and treatment.  Hospital Course: Shortly after admission to ICU, patient with unstable HR and extremely elevated blood pressure. CT head which showed global anoxic Injury and overall poor prognosis.  Pt's family elected to withdraw all life sustaining measures and transition to  comfort measures only on 09/09/2022 once all family able to  arrive at bedside.  He then expired early the next morning on 2022/09/26 at 01:17.    Pertinent Labs and Studies  Significant Diagnostic Studies CT HEAD WO CONTRAST (5MM)  Result Date: 08/23/2022 CLINICAL DATA:  Neuro deficit with stroke suspected EXAM: CT HEAD WITHOUT CONTRAST TECHNIQUE: Contiguous axial images were obtained from the base of the skull through the vertex without intravenous contrast. RADIATION DOSE REDUCTION: This exam was performed according to the departmental dose-optimization program which includes automated exposure control, adjustment of the mA and/or kV according to patient size and/or use of iterative reconstruction technique. COMPARISON:  07/10/2022 FINDINGS: Brain: Generalized poor gray-white differentiation with accentuation of dural reflections and decrease in subarachnoid spaces. No hemorrhage, hydrocephalus, or collection. No herniation. Vascular: No hyperdense vessel or unexpected calcification. Skull: Normal. Negative for fracture or focal lesion. Sinuses/Orbits: Partial bilateral mastoid opacification partial right mastoid opacification with negative nasopharynx. IMPRESSION: Findings of global anoxic injury.  No herniation or hydrocephalus. Electronically Signed   By: Jorje Guild M.D.   On: 08/24/2022 04:14   DG Abd Portable 1 View  Result Date: 09/09/2022 CLINICAL DATA:  Orogastric tube insertion EXAM: PORTABLE ABDOMEN - 1 VIEW COMPARISON:  None Available. FINDINGS: Side port of the orogastric tube is at the level of the gastroesophageal junction. Recommend advancing by 7 cm. IMPRESSION: Side port of the orogastric tube is at the level of the gastroesophageal junction. Recommend advancing by 7 cm. Electronically Signed   By: Ulyses Jarred M.D.   On: 08/29/2022 02:15   DG Chest Portable 1 View  Result Date: 09/11/2022 CLINICAL DATA:  Status post intubation. EXAM: PORTABLE CHEST 1 VIEW COMPARISON:  August 15, 2022 FINDINGS: An endotracheal tube is seen with its  distal tip approximately 3.9 cm from the carina. Stable right-sided venous catheter positioning is noted. The cardiac silhouette is mildly enlarged and unchanged in size. Mild to moderate severity bilateral diffuse interstitial opacities are seen. There is no evidence of a pleural effusion or pneumothorax. The visualized skeletal structures are unremarkable. IMPRESSION: 1. Endotracheal tube positioning, as described above. 2. Stable cardiomegaly with mild to moderate severity pulmonary edema. Electronically Signed   By: Virgina Norfolk M.D.   On: 09/16/2022 01:42   US THORACENTESIS ASP PLEURAL SPACE W/IMG GUIDE  Result Date: 08/15/2022 INDICATION: History of DM type 2, CHF, CAD and ESRD on HD. Patient admitted for DOE and orthopnea found to have pleural effusion. Patient received for therapeutic thoracentesis. EXAM: ULTRASOUND GUIDED THERAPEUTIC RIGHT THORACENTESIS MEDICATIONS: 10 mL 1 % lidocaine COMPLICATIONS: None immediate. PROCEDURE: An ultrasound guided thoracentesis was thoroughly discussed with the patient and questions answered. The benefits, risks, alternatives and complications were also discussed. The patient understands and wishes to proceed with the procedure. Written consent was obtained. Ultrasound was performed to localize and mark an adequate pocket of fluid in the right chest. The area was then prepped and draped in the normal sterile fashion. 1% Lidocaine was used for local anesthesia. Under ultrasound guidance a 6 Fr Safe-T-Centesis catheter was introduced. Thoracentesis was performed. The catheter was removed and a dressing applied. FINDINGS: A total of approximately 800 cc of clear, yellow fluid was removed. IMPRESSION: Successful ultrasound guided right thoracentesis yielding 800 cc of pleural fluid. Read by: Narda Rutherford, AGNP-BC Electronically Signed   By: Ruthann Cancer M.D.   On: 08/15/2022 15:46   DG Chest Port 1 View  Result Date: 08/15/2022 CLINICAL DATA:  Status post right  thoracentesis. EXAM: PORTABLE CHEST 1 VIEW COMPARISON:  August 14, 2022. FINDINGS: No pneumothorax status post right thoracentesis. No significant residual pleural effusion is noted. IMPRESSION: No pneumothorax status post right thoracentesis. Electronically Signed   By: Marijo Conception M.D.   On: 08/15/2022 15:11   DG Chest 2 View  Result Date: 08/14/2022 CLINICAL DATA:  Shortness of breath EXAM: CHEST - 2 VIEW COMPARISON:  X-ray 07/10/2022 and older FINDINGS: Underinflation. Small bilateral pleural effusions with some adjacent opacities. Atelectasis versus infiltrate. No pneumothorax. Slight vascular congestion. No edema today. Stable cardiopericardial silhouette. Degenerative changes of the spine. Stable double lumen right IJ catheter. IMPRESSION: Underinflation with developing bilateral small effusions and adjacent opacities. Atelectasis versus infiltrate. Recommend follow-up. Right IJ catheter. Electronically Signed   By: Jill Side M.D.   On: 08/14/2022 15:33    Microbiology Recent Results (from the past 240 hour(s))  MRSA Next Gen by PCR, Nasal     Status: None   Collection Time: 09/02/2022  2:39 AM   Specimen: Nasal Mucosa; Nasal Swab  Result Value Ref Range Status   MRSA by PCR Next Gen NOT DETECTED NOT DETECTED Final    Comment: (NOTE) The GeneXpert MRSA Assay (FDA approved for NASAL specimens only), is one component of a comprehensive MRSA colonization surveillance program. It is not intended to diagnose MRSA infection nor to guide or monitor treatment for MRSA infections. Test performance is not FDA approved in patients less than 15 years old. Performed at [facility], [address] [place], [place] [address]     Lab Basic Metabolic Panel: Recent Labs  Lab 09/08/2022 0120 09/15/2022 0121 08/29/2022 0437  NA 134*  --  135  137  K 4.4  --  4.2  4.2  CL 96*  --  99  99  CO2 21*  --  26  26  GLUCOSE 675*  --  378*  385*  BUN 46*  --  53*  55*  CREATININE  4.62*  --  4.69*  4.74*  CALCIUM 10.8*  --  10.0  10.2  MG  --  2.4 2.3  PHOS  --  8.9* 4.1   Liver Function Tests: Recent Labs  Lab 09/03/2022 0120 09/10/2022 0437  AST 231*  --   ALT 218*  --   ALKPHOS 113  --   BILITOT 0.9  --   PROT 5.8*  --   ALBUMIN 3.0* 3.2*   No results for input(s): "LIPASE", "AMYLASE" in the last 168 hours. No results for input(s): "AMMONIA" in the last 168 hours. CBC: Recent Labs  Lab 08/30/2022 0120 08/30/2022 0437  WBC 8.9 7.6  NEUTROABS 3.8  --   HGB 10.9* 10.5*  HCT 34.6* 30.6*  MCV 95.3 87.4  PLT 153 131*   Cardiac Enzymes: No results for input(s): "CKTOTAL", "CKMB", "CKMBINDEX", "TROPONINI" in the last 168 hours. Sepsis Labs: Recent Labs  Lab 09/09/2022 0120 09/17/2022 0121 09/12/2022 0437  PROCALCITON  --  0.13  --   WBC 8.9  --  7.6  LATICACIDVEN >9.0*  --  2.0*    Procedures/Operations  3/18: Endotracheal intubation (in ED) 3/18: Right radial arterial line placed     Darel Hong, AGACNP-BC [place] epic messenger for cross cover needs If after hours, please call E-link  Bradly Bienenstock 2022/09/21, 2:42 PM

## 2022-09-18 DEATH — deceased

## 2022-09-27 ENCOUNTER — Ambulatory Visit: Payer: Medicare Other | Admitting: Cardiology

## 2023-07-08 IMAGING — MR MR MRA HEAD W/O CM
1 series · 18 of 48 positions shown · IV contrast (gadavist)
Comparison: No pertinent prior exam.

CLINICAL DATA: Acute stroke suspected. Left arm numbness, headache,
and unsteady gait.

EXAM:
MRI HEAD WITHOUT CONTRAST
MRA HEAD WITHOUT CONTRAST
MRA OF THE NECK WITHOUT AND WITH CONTRAST
TECHNIQUE: Multiplanar, multi-echo pulse sequences of the brain and surrounding
structures were acquired without intravenous contrast. Angiographic
images of the Circle of Willis were acquired using MRA technique
without intravenous contrast. Angiographic images of the neck were
acquired using MRA technique without and with intravenous contrast.
Carotid stenosis measurements (when applicable) are obtained
utilizing NASCET criteria, using the distal internal carotid
diameter as the denominator.
CONTRAST:  6mL GADAVIST GADOBUTROL 1 MMOL/ML IV SOLN

[Series 13: TOF · axial · 0.5mm · 0.41mm/px · z∈[+47,+143]mm · 18 of 205 slices shown]
[im 1/205]
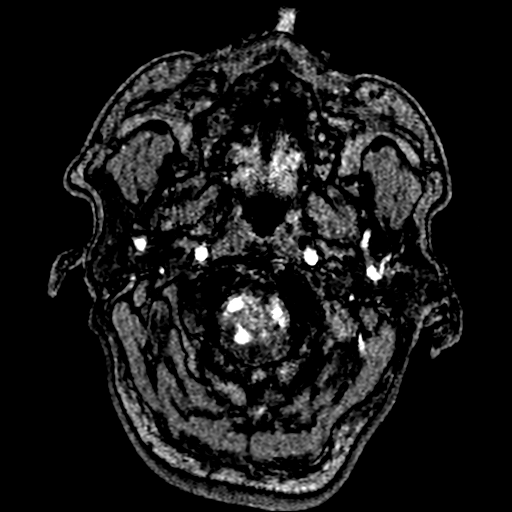
[im 5/205]
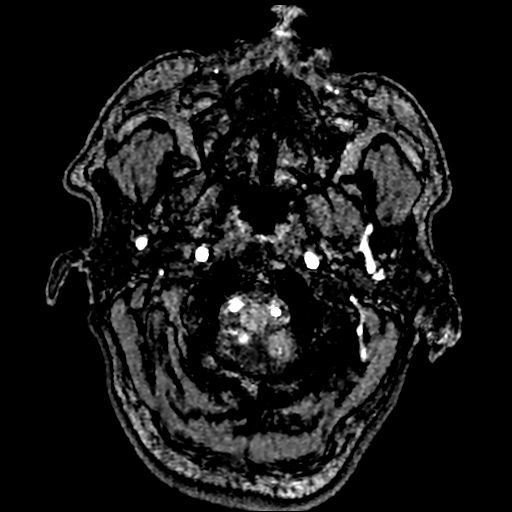
[im 9/205]
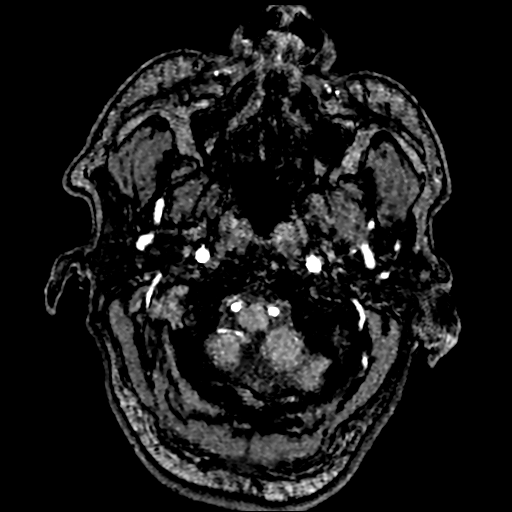
[im 14/205]
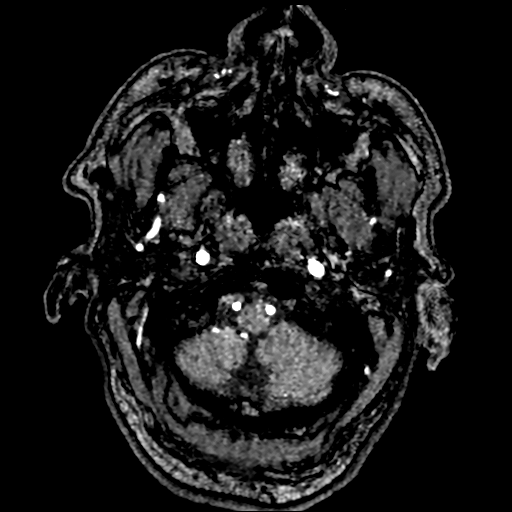
[im 18/205]
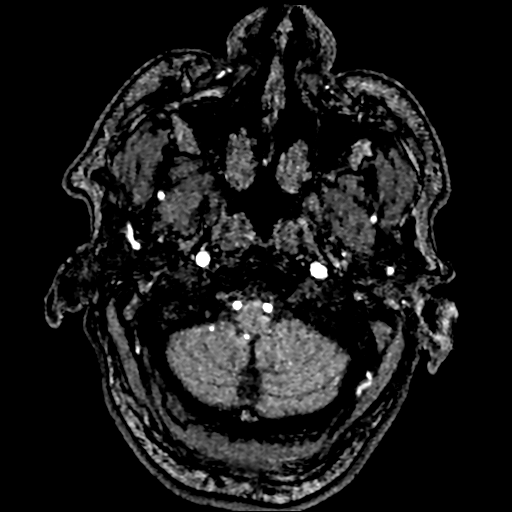
[im 22/205]
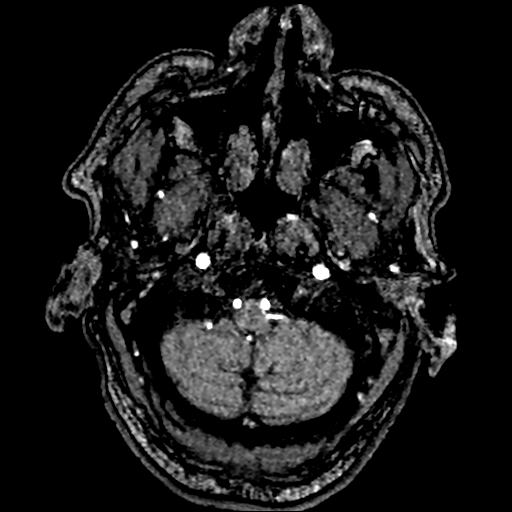
[im 27/205]
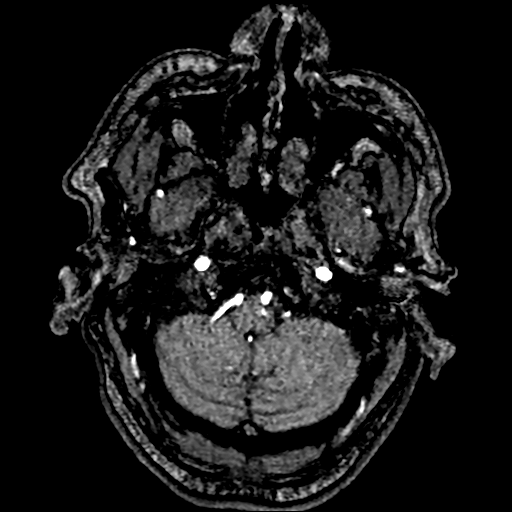
[im 31/205]
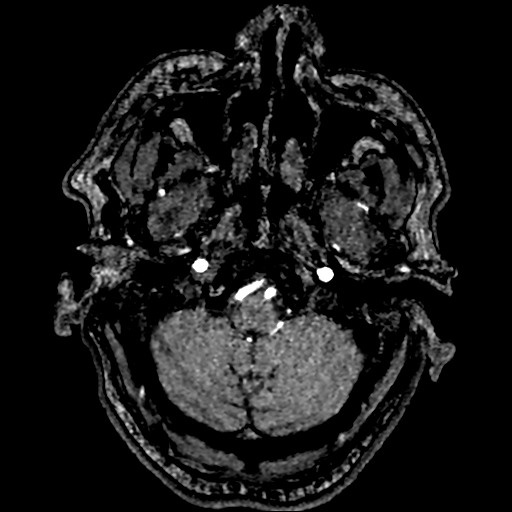
[im 35/205]
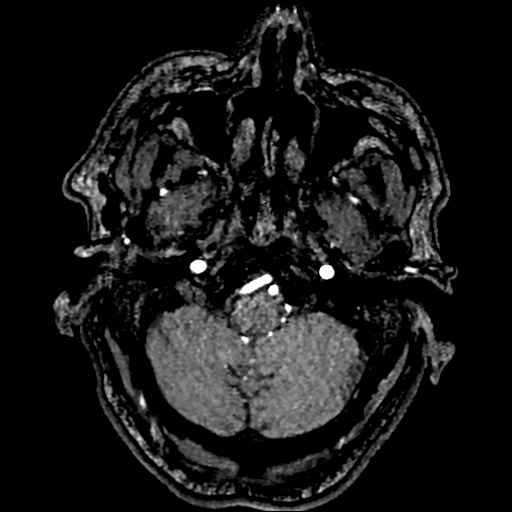
[im 40/205]
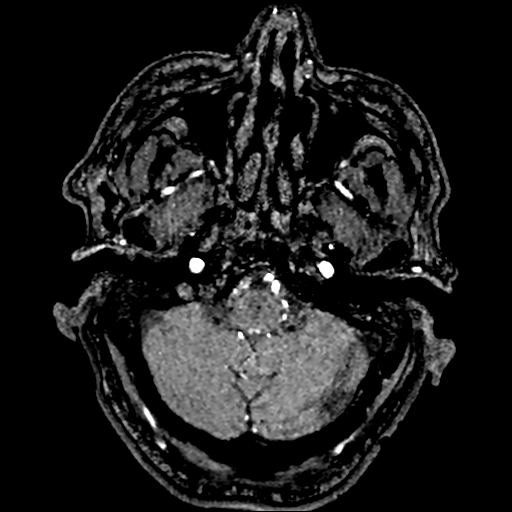
[im 66/205]
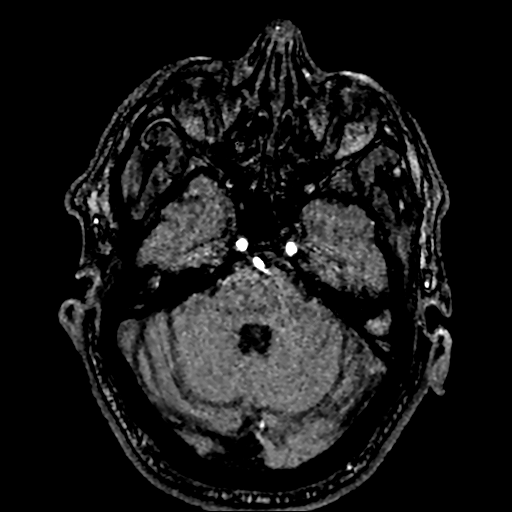
[im 92/205]
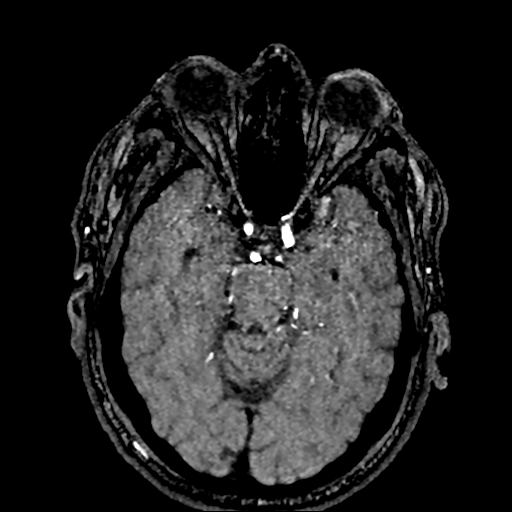
[im 105/205]
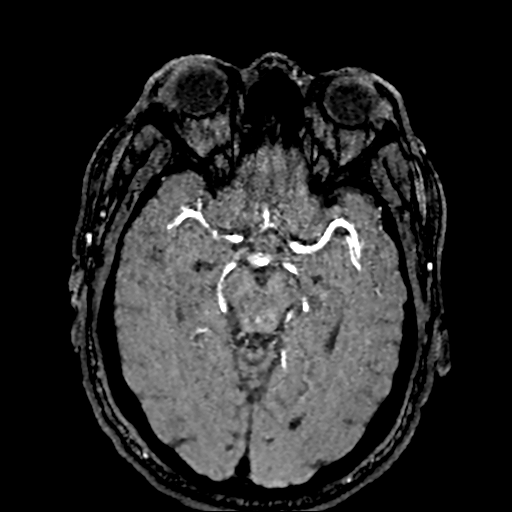
[im 118/205]
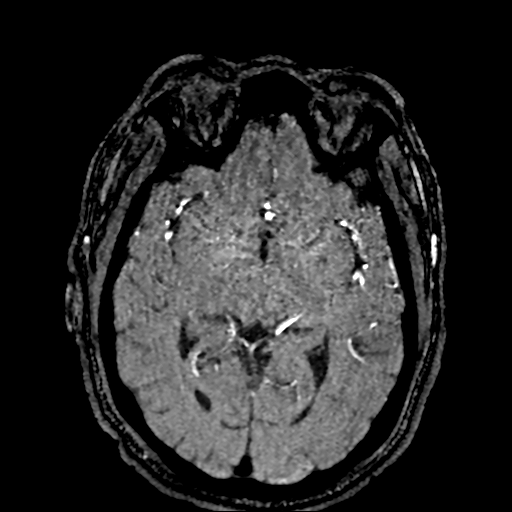
[im 144/205]
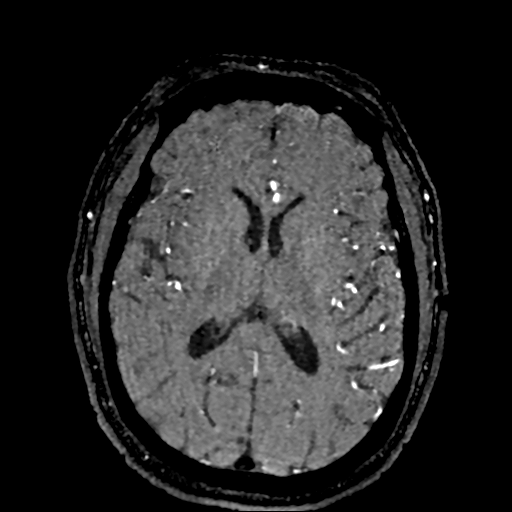
[im 170/205]
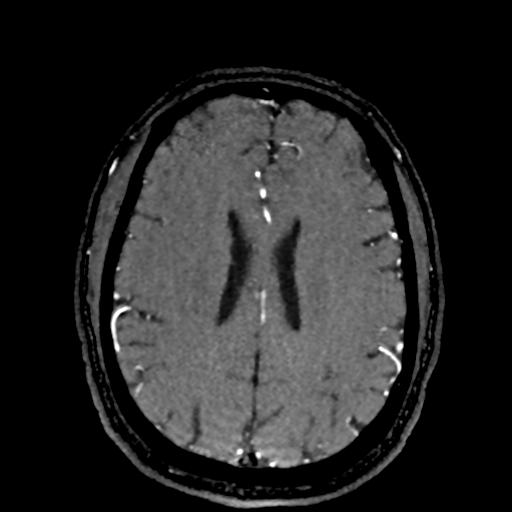
[im 174/205]
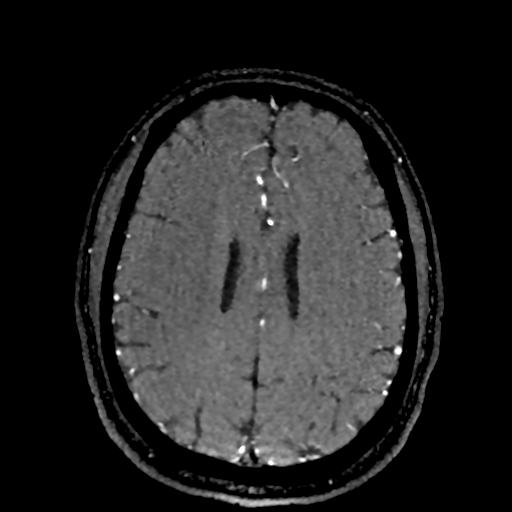
[im 196/205]
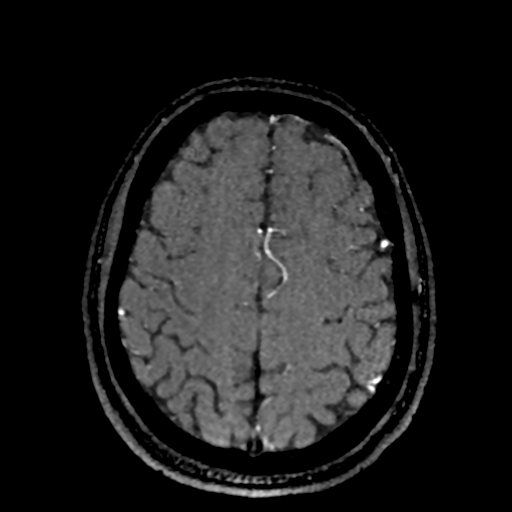

[18 of 48 positions shown; findings below may reference images not displayed]

FINDINGS: MR HEAD FINDINGS

Brain: No acute infarction, hemorrhage, hydrocephalus, extra-axial
collection or mass lesion. Small area of encephalomalacia along the
left occipital cortex and separately at the high left precentral
gyrus, usually from infarct. Few remote hemispheric white matter
insults with nonspecific pattern. Brain volume is normal

Vascular: Normal flow voids

Skull and upper cervical spine: Normal marrow signal

Sinuses/Orbits: Negative

Other: Intermittent motion artifact

MRA HEAD FINDINGS

Anterior circulation: Vessels are smooth and widely patent. Negative
for aneurysm or vascular malformation.

Posterior circulation: Vessels are smooth and widely patent.
Negative for aneurysm or vascular malformation.

Anatomic variants:None significant

MRA NECK FINDINGS

Aortic arch: 2 vessel arch which is normal diameter and smoothly
contoured.

Right carotid system: Smoothly contoured and diffusely patent.

Left carotid system: Proximal ICA narrowing with medial outpouching
measuring 3-4 mm. The more distal of 2 narrowings at this site
causes 50-60% stenosis.

Vertebral arteries: No proximal subclavian or vertebral stenosis.
Undulating left vertebral artery but no beaded or dissection
appearance.
IMPRESSION: Brain MRI:

1. No acute finding.
2. Small remote cortical infarcts in the left occipital lobe and
left precentral gyrus.

Neck MRA:

Focal irregularity and narrowing at the proximal left ICA with
50-60% stenosis and outpouching which may reflect plaque ulceration.

Intracranial MRA:

Negative.

## 2023-08-02 IMAGING — DX DG CHEST 1V
1 series · 1 of 1 positions shown · non-contrast
Comparison: 05/31/2021

CLINICAL DATA: Cough and fevers

EXAM:
PORTABLE CHEST 1 VIEW

[chest ap]
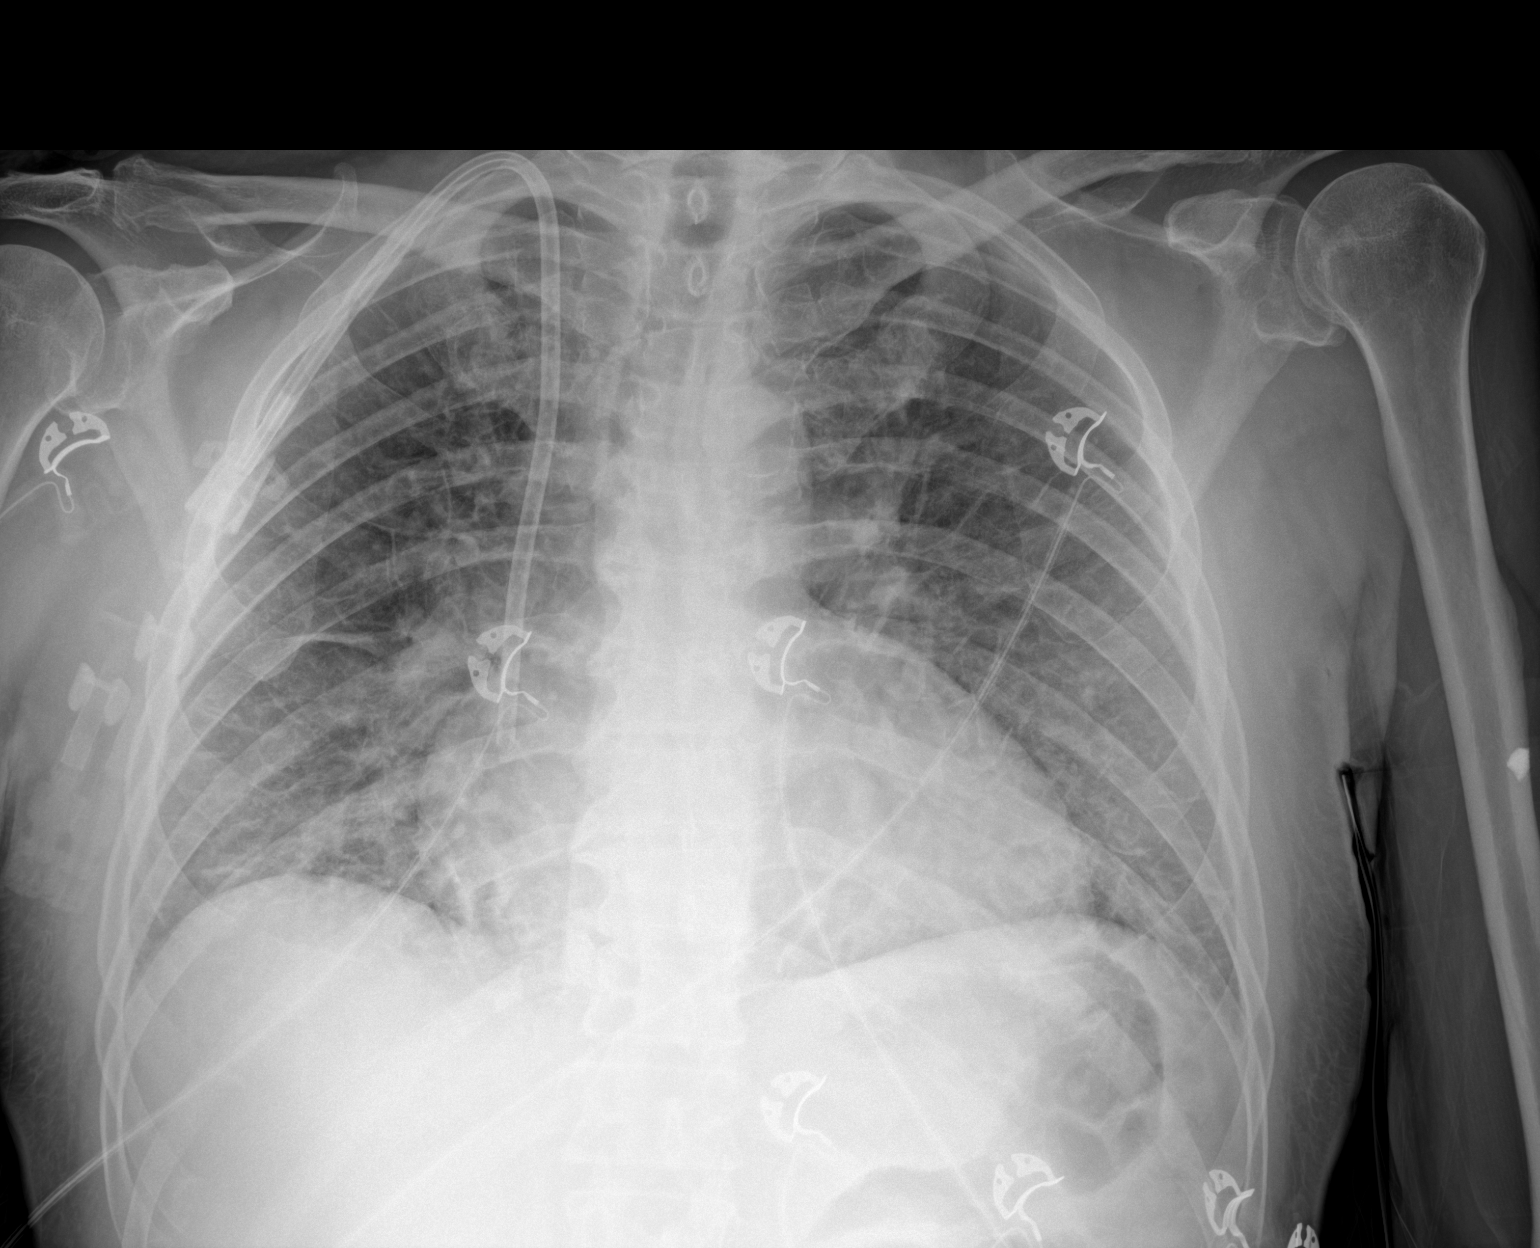

[1 of 1 positions shown; findings below may reference images not displayed]

FINDINGS: Cardiac shadow is enlarged but stable. Dialysis catheter is noted in
satisfactory position. Mild vascular congestion is again noted the
related to fluid overload. Interstitial edema is noted. No bony
abnormality is seen.
IMPRESSION: CHF with interstitial edema stable from the prior exam
